# Patient Record
Sex: Female | Born: 1955 | Race: White | Hispanic: No | Smoking: Former smoker
Health system: Southern US, Academic
[De-identification: ages and names within clinical notes are randomized; demographics above are authoritative.]

## PROBLEM LIST (undated history)

## (undated) ENCOUNTER — Encounter (HOSPITAL_COMMUNITY): Payer: Self-pay

## (undated) ENCOUNTER — Ambulatory Visit (HOSPITAL_COMMUNITY): Payer: Self-pay

## (undated) DIAGNOSIS — C801 Malignant (primary) neoplasm, unspecified: Secondary | ICD-10-CM

## (undated) DIAGNOSIS — I2510 Atherosclerotic heart disease of native coronary artery without angina pectoris: Secondary | ICD-10-CM

## (undated) DIAGNOSIS — H9190 Unspecified hearing loss, unspecified ear: Secondary | ICD-10-CM

## (undated) DIAGNOSIS — R0602 Shortness of breath: Secondary | ICD-10-CM

## (undated) DIAGNOSIS — M6281 Muscle weakness (generalized): Secondary | ICD-10-CM

## (undated) DIAGNOSIS — E669 Obesity, unspecified: Secondary | ICD-10-CM

## (undated) DIAGNOSIS — R519 Headache, unspecified: Secondary | ICD-10-CM

## (undated) DIAGNOSIS — G43909 Migraine, unspecified, not intractable, without status migrainosus: Secondary | ICD-10-CM

## (undated) DIAGNOSIS — M1990 Unspecified osteoarthritis, unspecified site: Secondary | ICD-10-CM

## (undated) DIAGNOSIS — R0600 Dyspnea, unspecified: Secondary | ICD-10-CM

## (undated) DIAGNOSIS — E079 Disorder of thyroid, unspecified: Secondary | ICD-10-CM

## (undated) DIAGNOSIS — F329 Major depressive disorder, single episode, unspecified: Secondary | ICD-10-CM

## (undated) DIAGNOSIS — F32A Depression, unspecified: Secondary | ICD-10-CM

## (undated) DIAGNOSIS — H9319 Tinnitus, unspecified ear: Secondary | ICD-10-CM

## (undated) DIAGNOSIS — Z9089 Acquired absence of other organs: Secondary | ICD-10-CM

## (undated) DIAGNOSIS — E042 Nontoxic multinodular goiter: Secondary | ICD-10-CM

## (undated) DIAGNOSIS — R600 Localized edema: Secondary | ICD-10-CM

## (undated) DIAGNOSIS — Z973 Presence of spectacles and contact lenses: Secondary | ICD-10-CM

## (undated) DIAGNOSIS — Z87440 Personal history of urinary (tract) infections: Secondary | ICD-10-CM

## (undated) DIAGNOSIS — R6889 Other general symptoms and signs: Secondary | ICD-10-CM

## (undated) DIAGNOSIS — R21 Rash and other nonspecific skin eruption: Secondary | ICD-10-CM

## (undated) DIAGNOSIS — Z8669 Personal history of other diseases of the nervous system and sense organs: Secondary | ICD-10-CM

## (undated) DIAGNOSIS — Z9889 Other specified postprocedural states: Secondary | ICD-10-CM

## (undated) DIAGNOSIS — G8929 Other chronic pain: Secondary | ICD-10-CM

## (undated) DIAGNOSIS — G4730 Sleep apnea, unspecified: Secondary | ICD-10-CM

## (undated) DIAGNOSIS — R609 Edema, unspecified: Secondary | ICD-10-CM

## (undated) DIAGNOSIS — K219 Gastro-esophageal reflux disease without esophagitis: Secondary | ICD-10-CM

## (undated) DIAGNOSIS — R0609 Other forms of dyspnea: Secondary | ICD-10-CM

## (undated) DIAGNOSIS — D34 Benign neoplasm of thyroid gland: Secondary | ICD-10-CM

## (undated) DIAGNOSIS — E890 Postprocedural hypothyroidism: Secondary | ICD-10-CM

## (undated) DIAGNOSIS — F419 Anxiety disorder, unspecified: Secondary | ICD-10-CM

## (undated) DIAGNOSIS — T8853XA Unintended awareness under general anesthesia during procedure, initial encounter: Secondary | ICD-10-CM

## (undated) DIAGNOSIS — I10 Essential (primary) hypertension: Secondary | ICD-10-CM

## (undated) DIAGNOSIS — I5189 Other ill-defined heart diseases: Secondary | ICD-10-CM

## (undated) DIAGNOSIS — I00 Rheumatic fever without heart involvement: Secondary | ICD-10-CM

## (undated) DIAGNOSIS — F4310 Post-traumatic stress disorder, unspecified: Secondary | ICD-10-CM

## (undated) DIAGNOSIS — E785 Hyperlipidemia, unspecified: Secondary | ICD-10-CM

## (undated) DIAGNOSIS — R911 Solitary pulmonary nodule: Secondary | ICD-10-CM

## (undated) DIAGNOSIS — E049 Nontoxic goiter, unspecified: Secondary | ICD-10-CM

## (undated) HISTORY — DX: Tinnitus, unspecified ear: H93.19

## (undated) HISTORY — DX: Unspecified hearing loss, unspecified ear: H91.90

## (undated) HISTORY — PX: HX COLONOSCOPY: 2100001147

## (undated) HISTORY — DX: Acquired absence of other organs: Z90.89

## (undated) HISTORY — DX: Gastro-esophageal reflux disease without esophagitis: K21.9

## (undated) HISTORY — PX: HX TONSILLECTOMY: SHX27

## (undated) HISTORY — PX: HX WISDOM TEETH EXTRACTION: SHX21

## (undated) HISTORY — PX: HX HEART CATHETERIZATION: SHX148

## (undated) HISTORY — PX: HX HYSTERECTOMY: SHX81

## (undated) HISTORY — PX: HX PAROTIDECTOMY: SHX169

## (undated) HISTORY — PX: HX GALL BLADDER SURGERY/CHOLE: SHX55

## (undated) HISTORY — PX: HX CERVICAL SPINE SURGERY: 2100001197

## (undated) HISTORY — DX: Migraine, unspecified, not intractable, without status migrainosus: G43.909

## (undated) HISTORY — PX: SEPTOPLASTY: SUR1290

## (undated) HISTORY — DX: Benign neoplasm of thyroid gland: D34

## (undated) HISTORY — DX: Postprocedural hypothyroidism: E89.0

## (undated) HISTORY — PX: HX OOPHORECTOMY: SHX86

## (undated) HISTORY — PX: HX ADENOIDECTOMY: SHX29

## (undated) HISTORY — PX: PARATHYROID GLAND SURGERY: SHX732

## (undated) HISTORY — PX: HX APPENDECTOMY: SHX54

## (undated) HISTORY — DX: Nontoxic multinodular goiter: E04.2

## (undated) HISTORY — DX: Sleep apnea, unspecified: G47.30

## (undated) HISTORY — DX: Other specified postprocedural states: Z98.890

## (undated) HISTORY — DX: Hyperlipidemia, unspecified: E78.5

## (undated) HISTORY — DX: Nontoxic goiter, unspecified: E04.9

## (undated) HISTORY — DX: Unspecified osteoarthritis, unspecified site: M19.90

## (undated) HISTORY — DX: Post-traumatic stress disorder, unspecified: F43.10

## (undated) HISTORY — DX: Other ill-defined heart diseases: I51.89

## (undated) HISTORY — DX: Major depressive disorder, single episode, unspecified: F32.9

## (undated) HISTORY — PX: CARDIAC CATHETERIZATION: SHX172

## (undated) HISTORY — DX: Rheumatic fever without heart involvement: I00

## (undated) HISTORY — DX: Atherosclerotic heart disease of native coronary artery without angina pectoris: I25.10

## (undated) SURGERY — IR ARTHROGRAM - HIP RIGHT
Laterality: Right

---
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
## 1977-10-25 DIAGNOSIS — R011 Cardiac murmur, unspecified: Secondary | ICD-10-CM

## 1977-10-25 HISTORY — PX: PB REVISE ULNAR NERVE AT ELBOW: 64718

## 1977-10-25 HISTORY — PX: HX TOTAL VAGINAL HYSTERECTOMY: SHX84

## 1977-10-25 HISTORY — DX: Cardiac murmur, unspecified: R01.1

## 1997-11-15 ENCOUNTER — Ambulatory Visit (INDEPENDENT_AMBULATORY_CARE_PROVIDER_SITE_OTHER): Payer: Self-pay

## 1997-12-11 ENCOUNTER — Ambulatory Visit (INDEPENDENT_AMBULATORY_CARE_PROVIDER_SITE_OTHER): Payer: Self-pay

## 1998-01-08 ENCOUNTER — Ambulatory Visit (INDEPENDENT_AMBULATORY_CARE_PROVIDER_SITE_OTHER): Payer: Self-pay

## 1998-01-22 ENCOUNTER — Ambulatory Visit (INDEPENDENT_AMBULATORY_CARE_PROVIDER_SITE_OTHER): Payer: Self-pay

## 1998-03-31 ENCOUNTER — Ambulatory Visit (INDEPENDENT_AMBULATORY_CARE_PROVIDER_SITE_OTHER): Payer: Self-pay | Admitting: Family Medicine

## 1998-04-03 ENCOUNTER — Ambulatory Visit (INDEPENDENT_AMBULATORY_CARE_PROVIDER_SITE_OTHER): Payer: Self-pay

## 1998-05-08 ENCOUNTER — Ambulatory Visit (INDEPENDENT_AMBULATORY_CARE_PROVIDER_SITE_OTHER): Payer: Self-pay

## 1998-05-27 ENCOUNTER — Ambulatory Visit (INDEPENDENT_AMBULATORY_CARE_PROVIDER_SITE_OTHER): Payer: Self-pay | Admitting: Family Medicine

## 1998-05-28 ENCOUNTER — Ambulatory Visit (INDEPENDENT_AMBULATORY_CARE_PROVIDER_SITE_OTHER): Payer: Self-pay

## 1998-06-11 ENCOUNTER — Ambulatory Visit (INDEPENDENT_AMBULATORY_CARE_PROVIDER_SITE_OTHER): Payer: Self-pay | Admitting: Family Medicine

## 1998-06-18 ENCOUNTER — Ambulatory Visit (INDEPENDENT_AMBULATORY_CARE_PROVIDER_SITE_OTHER): Payer: Self-pay | Admitting: Orthopaedic Surgery

## 1998-08-28 ENCOUNTER — Ambulatory Visit (INDEPENDENT_AMBULATORY_CARE_PROVIDER_SITE_OTHER): Payer: Self-pay

## 1998-09-03 ENCOUNTER — Ambulatory Visit (HOSPITAL_BASED_OUTPATIENT_CLINIC_OR_DEPARTMENT_OTHER): Payer: Self-pay | Admitting: Family Medicine

## 1998-09-11 ENCOUNTER — Ambulatory Visit (INDEPENDENT_AMBULATORY_CARE_PROVIDER_SITE_OTHER): Payer: Self-pay

## 1998-10-02 ENCOUNTER — Ambulatory Visit (INDEPENDENT_AMBULATORY_CARE_PROVIDER_SITE_OTHER): Payer: Self-pay

## 1998-10-28 ENCOUNTER — Ambulatory Visit (INDEPENDENT_AMBULATORY_CARE_PROVIDER_SITE_OTHER): Payer: Self-pay

## 1999-01-15 ENCOUNTER — Observation Stay (HOSPITAL_COMMUNITY): Payer: Self-pay

## 1999-01-28 ENCOUNTER — Ambulatory Visit (INDEPENDENT_AMBULATORY_CARE_PROVIDER_SITE_OTHER): Payer: Self-pay

## 1999-02-05 ENCOUNTER — Ambulatory Visit (INDEPENDENT_AMBULATORY_CARE_PROVIDER_SITE_OTHER): Payer: Self-pay

## 1999-03-20 ENCOUNTER — Ambulatory Visit (INDEPENDENT_AMBULATORY_CARE_PROVIDER_SITE_OTHER): Payer: Self-pay

## 1999-04-03 ENCOUNTER — Ambulatory Visit (INDEPENDENT_AMBULATORY_CARE_PROVIDER_SITE_OTHER): Payer: Self-pay

## 1999-04-07 ENCOUNTER — Ambulatory Visit (INDEPENDENT_AMBULATORY_CARE_PROVIDER_SITE_OTHER): Payer: Self-pay | Admitting: Family Medicine

## 1999-04-21 ENCOUNTER — Ambulatory Visit (INDEPENDENT_AMBULATORY_CARE_PROVIDER_SITE_OTHER): Payer: Self-pay

## 1999-05-08 ENCOUNTER — Ambulatory Visit (INDEPENDENT_AMBULATORY_CARE_PROVIDER_SITE_OTHER): Payer: Self-pay | Admitting: Otolaryngology

## 1999-05-26 ENCOUNTER — Ambulatory Visit (INDEPENDENT_AMBULATORY_CARE_PROVIDER_SITE_OTHER): Payer: Self-pay

## 1999-06-02 ENCOUNTER — Ambulatory Visit (INDEPENDENT_AMBULATORY_CARE_PROVIDER_SITE_OTHER): Payer: Self-pay | Admitting: Otolaryngology

## 1999-06-29 ENCOUNTER — Emergency Department (HOSPITAL_COMMUNITY): Payer: Self-pay

## 1999-06-30 ENCOUNTER — Emergency Department (HOSPITAL_COMMUNITY): Payer: Self-pay

## 1999-07-08 ENCOUNTER — Emergency Department (HOSPITAL_COMMUNITY): Payer: Self-pay

## 1999-07-10 ENCOUNTER — Emergency Department (HOSPITAL_COMMUNITY): Payer: Self-pay | Admitting: Emergency Medicine

## 1999-07-21 ENCOUNTER — Ambulatory Visit (INDEPENDENT_AMBULATORY_CARE_PROVIDER_SITE_OTHER): Payer: Self-pay

## 1999-08-11 ENCOUNTER — Ambulatory Visit (INDEPENDENT_AMBULATORY_CARE_PROVIDER_SITE_OTHER): Payer: Self-pay

## 1999-10-03 ENCOUNTER — Ambulatory Visit (INDEPENDENT_AMBULATORY_CARE_PROVIDER_SITE_OTHER): Payer: Self-pay | Admitting: Family Medicine

## 1999-11-12 ENCOUNTER — Ambulatory Visit (INDEPENDENT_AMBULATORY_CARE_PROVIDER_SITE_OTHER): Payer: Self-pay | Admitting: Family Medicine

## 1999-11-17 ENCOUNTER — Other Ambulatory Visit: Payer: Self-pay

## 1999-11-17 ENCOUNTER — Ambulatory Visit (INDEPENDENT_AMBULATORY_CARE_PROVIDER_SITE_OTHER): Payer: Self-pay | Admitting: Family Medicine

## 1999-12-03 ENCOUNTER — Ambulatory Visit (INDEPENDENT_AMBULATORY_CARE_PROVIDER_SITE_OTHER): Payer: Self-pay | Admitting: Family Medicine

## 1999-12-07 ENCOUNTER — Ambulatory Visit (INDEPENDENT_AMBULATORY_CARE_PROVIDER_SITE_OTHER): Payer: Self-pay | Admitting: Family Medicine

## 1999-12-22 ENCOUNTER — Ambulatory Visit (HOSPITAL_BASED_OUTPATIENT_CLINIC_OR_DEPARTMENT_OTHER): Payer: Self-pay

## 1999-12-24 ENCOUNTER — Ambulatory Visit (INDEPENDENT_AMBULATORY_CARE_PROVIDER_SITE_OTHER): Payer: Self-pay | Admitting: Family Medicine

## 1999-12-30 ENCOUNTER — Ambulatory Visit (HOSPITAL_COMMUNITY): Payer: Self-pay

## 2000-01-26 ENCOUNTER — Ambulatory Visit (HOSPITAL_BASED_OUTPATIENT_CLINIC_OR_DEPARTMENT_OTHER): Payer: Self-pay

## 2000-03-16 ENCOUNTER — Ambulatory Visit (INDEPENDENT_AMBULATORY_CARE_PROVIDER_SITE_OTHER): Payer: Self-pay

## 2000-06-13 ENCOUNTER — Other Ambulatory Visit: Payer: Self-pay

## 2000-06-13 ENCOUNTER — Observation Stay (HOSPITAL_COMMUNITY): Payer: Self-pay | Admitting: Family Medicine

## 2000-06-30 ENCOUNTER — Ambulatory Visit (INDEPENDENT_AMBULATORY_CARE_PROVIDER_SITE_OTHER): Payer: Self-pay | Admitting: Family Medicine

## 2000-07-04 ENCOUNTER — Ambulatory Visit (INDEPENDENT_AMBULATORY_CARE_PROVIDER_SITE_OTHER): Payer: Self-pay

## 2000-08-01 ENCOUNTER — Ambulatory Visit (INDEPENDENT_AMBULATORY_CARE_PROVIDER_SITE_OTHER): Payer: Self-pay

## 2000-08-31 ENCOUNTER — Ambulatory Visit (INDEPENDENT_AMBULATORY_CARE_PROVIDER_SITE_OTHER): Payer: Self-pay | Admitting: Family Medicine

## 2000-09-02 ENCOUNTER — Other Ambulatory Visit: Payer: Self-pay

## 2000-09-02 ENCOUNTER — Ambulatory Visit (INDEPENDENT_AMBULATORY_CARE_PROVIDER_SITE_OTHER): Payer: Self-pay | Admitting: Family Medicine

## 2000-09-06 ENCOUNTER — Ambulatory Visit (INDEPENDENT_AMBULATORY_CARE_PROVIDER_SITE_OTHER): Payer: Self-pay | Admitting: Family Medicine

## 2000-09-19 ENCOUNTER — Ambulatory Visit (INDEPENDENT_AMBULATORY_CARE_PROVIDER_SITE_OTHER): Payer: Self-pay | Admitting: Family Medicine

## 2000-09-29 ENCOUNTER — Ambulatory Visit (INDEPENDENT_AMBULATORY_CARE_PROVIDER_SITE_OTHER): Payer: Self-pay | Admitting: Family Medicine

## 2000-12-12 ENCOUNTER — Ambulatory Visit (INDEPENDENT_AMBULATORY_CARE_PROVIDER_SITE_OTHER): Payer: Self-pay | Admitting: Family Medicine

## 2001-02-03 ENCOUNTER — Ambulatory Visit (INDEPENDENT_AMBULATORY_CARE_PROVIDER_SITE_OTHER): Payer: Self-pay

## 2001-02-10 ENCOUNTER — Other Ambulatory Visit: Payer: Self-pay

## 2001-02-10 ENCOUNTER — Ambulatory Visit (INDEPENDENT_AMBULATORY_CARE_PROVIDER_SITE_OTHER): Payer: Self-pay

## 2001-02-17 ENCOUNTER — Ambulatory Visit (INDEPENDENT_AMBULATORY_CARE_PROVIDER_SITE_OTHER): Payer: Self-pay

## 2001-02-28 ENCOUNTER — Ambulatory Visit (INDEPENDENT_AMBULATORY_CARE_PROVIDER_SITE_OTHER): Payer: Self-pay | Admitting: Family Medicine

## 2001-07-05 ENCOUNTER — Ambulatory Visit (INDEPENDENT_AMBULATORY_CARE_PROVIDER_SITE_OTHER): Payer: Self-pay

## 2001-07-25 ENCOUNTER — Ambulatory Visit (INDEPENDENT_AMBULATORY_CARE_PROVIDER_SITE_OTHER): Payer: Self-pay

## 2001-08-02 ENCOUNTER — Other Ambulatory Visit: Payer: Self-pay

## 2001-08-02 ENCOUNTER — Ambulatory Visit (INDEPENDENT_AMBULATORY_CARE_PROVIDER_SITE_OTHER): Payer: Self-pay | Admitting: Family Medicine

## 2001-09-04 ENCOUNTER — Ambulatory Visit (INDEPENDENT_AMBULATORY_CARE_PROVIDER_SITE_OTHER): Payer: Self-pay | Admitting: Family Medicine

## 2001-09-11 ENCOUNTER — Ambulatory Visit (INDEPENDENT_AMBULATORY_CARE_PROVIDER_SITE_OTHER): Payer: Self-pay | Admitting: Family Medicine

## 2001-09-25 ENCOUNTER — Ambulatory Visit (HOSPITAL_COMMUNITY): Payer: Self-pay

## 2001-09-25 ENCOUNTER — Other Ambulatory Visit: Payer: Self-pay

## 2001-10-04 ENCOUNTER — Ambulatory Visit (INDEPENDENT_AMBULATORY_CARE_PROVIDER_SITE_OTHER): Payer: Self-pay | Admitting: Family Medicine

## 2001-10-09 ENCOUNTER — Ambulatory Visit (INDEPENDENT_AMBULATORY_CARE_PROVIDER_SITE_OTHER): Payer: Self-pay

## 2001-10-23 ENCOUNTER — Ambulatory Visit (INDEPENDENT_AMBULATORY_CARE_PROVIDER_SITE_OTHER): Payer: Self-pay | Admitting: Family Medicine

## 2001-11-09 ENCOUNTER — Ambulatory Visit (INDEPENDENT_AMBULATORY_CARE_PROVIDER_SITE_OTHER): Payer: Self-pay | Admitting: SURGERY OF THE HAND

## 2001-11-22 ENCOUNTER — Ambulatory Visit (INDEPENDENT_AMBULATORY_CARE_PROVIDER_SITE_OTHER): Payer: Self-pay | Admitting: Family Medicine

## 2001-11-23 ENCOUNTER — Ambulatory Visit (INDEPENDENT_AMBULATORY_CARE_PROVIDER_SITE_OTHER): Payer: Self-pay | Admitting: SURGERY OF THE HAND

## 2002-01-10 ENCOUNTER — Emergency Department (HOSPITAL_COMMUNITY): Payer: Self-pay | Admitting: Emergency Medicine

## 2002-01-10 ENCOUNTER — Other Ambulatory Visit: Payer: Self-pay | Admitting: Emergency Medicine

## 2002-01-11 ENCOUNTER — Ambulatory Visit (INDEPENDENT_AMBULATORY_CARE_PROVIDER_SITE_OTHER): Payer: Self-pay | Admitting: Ophthalmology

## 2002-01-22 ENCOUNTER — Ambulatory Visit (INDEPENDENT_AMBULATORY_CARE_PROVIDER_SITE_OTHER): Payer: Self-pay | Admitting: Family Medicine

## 2002-02-15 ENCOUNTER — Ambulatory Visit (INDEPENDENT_AMBULATORY_CARE_PROVIDER_SITE_OTHER): Payer: Self-pay | Admitting: Family Medicine

## 2002-03-21 ENCOUNTER — Ambulatory Visit (INDEPENDENT_AMBULATORY_CARE_PROVIDER_SITE_OTHER): Payer: Self-pay

## 2002-03-23 ENCOUNTER — Ambulatory Visit (INDEPENDENT_AMBULATORY_CARE_PROVIDER_SITE_OTHER): Payer: Self-pay | Admitting: Family Medicine

## 2002-03-24 ENCOUNTER — Ambulatory Visit (HOSPITAL_COMMUNITY): Payer: Self-pay

## 2002-03-24 ENCOUNTER — Other Ambulatory Visit: Payer: Self-pay

## 2002-03-25 ENCOUNTER — Ambulatory Visit (HOSPITAL_COMMUNITY): Payer: Self-pay

## 2002-04-10 ENCOUNTER — Ambulatory Visit (INDEPENDENT_AMBULATORY_CARE_PROVIDER_SITE_OTHER): Payer: Self-pay | Admitting: Family Medicine

## 2002-05-16 ENCOUNTER — Ambulatory Visit (INDEPENDENT_AMBULATORY_CARE_PROVIDER_SITE_OTHER): Payer: Self-pay | Admitting: Family Medicine

## 2002-06-07 ENCOUNTER — Ambulatory Visit (INDEPENDENT_AMBULATORY_CARE_PROVIDER_SITE_OTHER): Payer: Self-pay | Admitting: Family Medicine

## 2002-06-08 ENCOUNTER — Ambulatory Visit (HOSPITAL_COMMUNITY): Payer: Self-pay

## 2002-06-08 ENCOUNTER — Other Ambulatory Visit: Payer: Self-pay

## 2002-08-10 ENCOUNTER — Ambulatory Visit (INDEPENDENT_AMBULATORY_CARE_PROVIDER_SITE_OTHER): Payer: Self-pay | Admitting: Family Medicine

## 2002-10-09 ENCOUNTER — Emergency Department (HOSPITAL_COMMUNITY): Payer: Self-pay | Admitting: Emergency Medicine-WVUH

## 2002-10-11 ENCOUNTER — Ambulatory Visit (INDEPENDENT_AMBULATORY_CARE_PROVIDER_SITE_OTHER): Payer: Self-pay | Admitting: Family Medicine

## 2002-11-13 ENCOUNTER — Ambulatory Visit (HOSPITAL_COMMUNITY): Payer: Self-pay

## 2002-12-03 ENCOUNTER — Inpatient Hospital Stay (HOSPITAL_COMMUNITY): Payer: Self-pay | Admitting: Neurology

## 2002-12-10 ENCOUNTER — Ambulatory Visit (INDEPENDENT_AMBULATORY_CARE_PROVIDER_SITE_OTHER): Payer: Self-pay | Admitting: Family Medicine

## 2003-01-02 ENCOUNTER — Ambulatory Visit (INDEPENDENT_AMBULATORY_CARE_PROVIDER_SITE_OTHER): Payer: Self-pay | Admitting: Family Medicine

## 2003-01-03 ENCOUNTER — Ambulatory Visit (INDEPENDENT_AMBULATORY_CARE_PROVIDER_SITE_OTHER): Payer: Self-pay | Admitting: Family Medicine

## 2003-03-06 ENCOUNTER — Ambulatory Visit (INDEPENDENT_AMBULATORY_CARE_PROVIDER_SITE_OTHER): Payer: Self-pay | Admitting: Family Medicine

## 2003-03-06 ENCOUNTER — Inpatient Hospital Stay (HOSPITAL_COMMUNITY): Payer: Self-pay | Admitting: Psychiatry

## 2003-03-26 ENCOUNTER — Ambulatory Visit (INDEPENDENT_AMBULATORY_CARE_PROVIDER_SITE_OTHER): Payer: Self-pay

## 2003-04-11 ENCOUNTER — Ambulatory Visit (INDEPENDENT_AMBULATORY_CARE_PROVIDER_SITE_OTHER): Payer: Self-pay

## 2003-05-07 ENCOUNTER — Ambulatory Visit (INDEPENDENT_AMBULATORY_CARE_PROVIDER_SITE_OTHER): Payer: Self-pay | Admitting: Family Medicine

## 2003-05-30 ENCOUNTER — Ambulatory Visit (HOSPITAL_COMMUNITY): Payer: Self-pay

## 2003-06-04 ENCOUNTER — Ambulatory Visit (INDEPENDENT_AMBULATORY_CARE_PROVIDER_SITE_OTHER): Payer: Self-pay

## 2003-07-04 ENCOUNTER — Ambulatory Visit (INDEPENDENT_AMBULATORY_CARE_PROVIDER_SITE_OTHER): Payer: Self-pay | Admitting: Family Medicine

## 2003-07-16 ENCOUNTER — Ambulatory Visit (INDEPENDENT_AMBULATORY_CARE_PROVIDER_SITE_OTHER): Payer: Self-pay

## 2003-07-23 ENCOUNTER — Ambulatory Visit (INDEPENDENT_AMBULATORY_CARE_PROVIDER_SITE_OTHER): Payer: Self-pay

## 2003-08-23 ENCOUNTER — Encounter (FREE_STANDING_LABORATORY_FACILITY): Payer: Self-pay | Admitting: Pathology

## 2003-08-23 ENCOUNTER — Ambulatory Visit (HOSPITAL_COMMUNITY): Payer: Self-pay

## 2003-09-09 ENCOUNTER — Ambulatory Visit (INDEPENDENT_AMBULATORY_CARE_PROVIDER_SITE_OTHER): Payer: Self-pay

## 2003-10-01 ENCOUNTER — Ambulatory Visit (HOSPITAL_COMMUNITY): Payer: Self-pay

## 2003-10-07 ENCOUNTER — Ambulatory Visit (INDEPENDENT_AMBULATORY_CARE_PROVIDER_SITE_OTHER): Payer: Self-pay | Admitting: Family Medicine

## 2003-10-08 ENCOUNTER — Encounter (FREE_STANDING_LABORATORY_FACILITY): Payer: Self-pay | Admitting: Pathology

## 2003-10-15 ENCOUNTER — Encounter (FREE_STANDING_LABORATORY_FACILITY): Payer: Self-pay | Admitting: Pathology

## 2003-10-28 ENCOUNTER — Ambulatory Visit (INDEPENDENT_AMBULATORY_CARE_PROVIDER_SITE_OTHER): Payer: Self-pay

## 2003-10-29 ENCOUNTER — Observation Stay (HOSPITAL_COMMUNITY): Payer: Self-pay | Admitting: Family Medicine

## 2003-11-06 ENCOUNTER — Ambulatory Visit (INDEPENDENT_AMBULATORY_CARE_PROVIDER_SITE_OTHER): Payer: Self-pay | Admitting: Family Medicine

## 2004-01-01 ENCOUNTER — Ambulatory Visit (INDEPENDENT_AMBULATORY_CARE_PROVIDER_SITE_OTHER): Payer: Self-pay | Admitting: Family Medicine

## 2004-01-24 ENCOUNTER — Ambulatory Visit (INDEPENDENT_AMBULATORY_CARE_PROVIDER_SITE_OTHER): Payer: Self-pay

## 2004-01-30 ENCOUNTER — Ambulatory Visit (INDEPENDENT_AMBULATORY_CARE_PROVIDER_SITE_OTHER): Payer: Self-pay | Admitting: Family Medicine

## 2004-02-03 ENCOUNTER — Ambulatory Visit (INDEPENDENT_AMBULATORY_CARE_PROVIDER_SITE_OTHER): Payer: Self-pay | Admitting: Family Medicine

## 2004-02-04 ENCOUNTER — Ambulatory Visit (INDEPENDENT_AMBULATORY_CARE_PROVIDER_SITE_OTHER): Payer: Self-pay | Admitting: Family Medicine

## 2004-02-05 ENCOUNTER — Ambulatory Visit (INDEPENDENT_AMBULATORY_CARE_PROVIDER_SITE_OTHER): Payer: Self-pay | Admitting: Family Medicine

## 2004-02-20 ENCOUNTER — Ambulatory Visit (HOSPITAL_COMMUNITY): Payer: Self-pay | Admitting: Gastroenterology

## 2004-02-21 ENCOUNTER — Ambulatory Visit (INDEPENDENT_AMBULATORY_CARE_PROVIDER_SITE_OTHER): Payer: Self-pay | Admitting: Family Medicine

## 2004-02-26 ENCOUNTER — Ambulatory Visit (INDEPENDENT_AMBULATORY_CARE_PROVIDER_SITE_OTHER): Payer: Self-pay | Admitting: Family Medicine

## 2004-03-05 ENCOUNTER — Ambulatory Visit (HOSPITAL_COMMUNITY): Payer: Self-pay

## 2004-03-26 ENCOUNTER — Ambulatory Visit (HOSPITAL_COMMUNITY): Payer: Self-pay

## 2004-04-16 ENCOUNTER — Ambulatory Visit (HOSPITAL_COMMUNITY): Payer: Self-pay

## 2004-05-06 ENCOUNTER — Ambulatory Visit (INDEPENDENT_AMBULATORY_CARE_PROVIDER_SITE_OTHER): Payer: Self-pay

## 2004-05-07 ENCOUNTER — Ambulatory Visit (HOSPITAL_COMMUNITY): Payer: Self-pay

## 2004-05-29 ENCOUNTER — Ambulatory Visit (INDEPENDENT_AMBULATORY_CARE_PROVIDER_SITE_OTHER): Payer: Self-pay

## 2004-06-02 ENCOUNTER — Ambulatory Visit (HOSPITAL_COMMUNITY): Payer: Self-pay

## 2004-07-23 ENCOUNTER — Emergency Department (HOSPITAL_COMMUNITY): Payer: Self-pay | Admitting: Emergency Medicine

## 2004-07-27 ENCOUNTER — Ambulatory Visit (INDEPENDENT_AMBULATORY_CARE_PROVIDER_SITE_OTHER): Payer: Self-pay | Admitting: Family Medicine

## 2004-07-31 ENCOUNTER — Ambulatory Visit (INDEPENDENT_AMBULATORY_CARE_PROVIDER_SITE_OTHER): Payer: Self-pay | Admitting: Family Medicine

## 2004-09-01 ENCOUNTER — Ambulatory Visit (INDEPENDENT_AMBULATORY_CARE_PROVIDER_SITE_OTHER): Payer: Self-pay

## 2004-10-20 ENCOUNTER — Ambulatory Visit (INDEPENDENT_AMBULATORY_CARE_PROVIDER_SITE_OTHER): Payer: Self-pay

## 2004-11-30 ENCOUNTER — Other Ambulatory Visit (INDEPENDENT_AMBULATORY_CARE_PROVIDER_SITE_OTHER): Payer: Self-pay

## 2005-03-24 ENCOUNTER — Other Ambulatory Visit: Payer: Self-pay

## 2005-09-07 ENCOUNTER — Other Ambulatory Visit (INDEPENDENT_AMBULATORY_CARE_PROVIDER_SITE_OTHER): Payer: Self-pay

## 2005-10-25 HISTORY — PX: HX WRIST FRACTURE TX: SHX121

## 2005-10-25 HISTORY — PX: HX ANKLE FRACTURE TX: SHX122

## 2006-01-07 ENCOUNTER — Ambulatory Visit (HOSPITAL_COMMUNITY): Payer: Self-pay

## 2006-01-12 ENCOUNTER — Ambulatory Visit (INDEPENDENT_AMBULATORY_CARE_PROVIDER_SITE_OTHER): Payer: Self-pay | Admitting: Family Medicine

## 2006-02-10 ENCOUNTER — Ambulatory Visit (INDEPENDENT_AMBULATORY_CARE_PROVIDER_SITE_OTHER): Payer: Self-pay | Admitting: Family Medicine

## 2006-02-11 ENCOUNTER — Ambulatory Visit (HOSPITAL_COMMUNITY): Payer: Self-pay

## 2006-02-23 ENCOUNTER — Ambulatory Visit (INDEPENDENT_AMBULATORY_CARE_PROVIDER_SITE_OTHER): Payer: Self-pay | Admitting: Family Medicine

## 2006-03-11 ENCOUNTER — Ambulatory Visit (INDEPENDENT_AMBULATORY_CARE_PROVIDER_SITE_OTHER): Payer: Self-pay | Admitting: Family Medicine

## 2006-03-23 ENCOUNTER — Ambulatory Visit (HOSPITAL_COMMUNITY): Payer: Self-pay

## 2006-04-15 ENCOUNTER — Ambulatory Visit (HOSPITAL_COMMUNITY): Payer: Self-pay

## 2006-04-22 ENCOUNTER — Ambulatory Visit (INDEPENDENT_AMBULATORY_CARE_PROVIDER_SITE_OTHER): Payer: Self-pay | Admitting: Family Medicine

## 2006-05-05 ENCOUNTER — Ambulatory Visit (INDEPENDENT_AMBULATORY_CARE_PROVIDER_SITE_OTHER): Payer: Self-pay

## 2006-05-24 ENCOUNTER — Ambulatory Visit (HOSPITAL_COMMUNITY): Payer: Self-pay

## 2006-07-06 ENCOUNTER — Ambulatory Visit (INDEPENDENT_AMBULATORY_CARE_PROVIDER_SITE_OTHER): Payer: Self-pay | Admitting: Family Medicine

## 2006-07-18 ENCOUNTER — Ambulatory Visit (INDEPENDENT_AMBULATORY_CARE_PROVIDER_SITE_OTHER): Payer: Self-pay | Admitting: Family Medicine

## 2006-08-04 ENCOUNTER — Ambulatory Visit (HOSPITAL_COMMUNITY): Payer: Self-pay | Admitting: ORTHOPEDIC, SPORTS MEDICINE

## 2006-08-08 ENCOUNTER — Ambulatory Visit (HOSPITAL_COMMUNITY): Payer: Self-pay

## 2006-08-12 ENCOUNTER — Ambulatory Visit (HOSPITAL_COMMUNITY): Payer: Self-pay

## 2006-09-11 ENCOUNTER — Ambulatory Visit (INDEPENDENT_AMBULATORY_CARE_PROVIDER_SITE_OTHER): Payer: Self-pay

## 2006-09-20 ENCOUNTER — Ambulatory Visit (HOSPITAL_COMMUNITY): Payer: Self-pay | Admitting: ORTHOPEDIC, SPORTS MEDICINE

## 2006-11-07 ENCOUNTER — Ambulatory Visit (HOSPITAL_COMMUNITY): Payer: Self-pay | Admitting: ORTHOPEDIC, SPORTS MEDICINE

## 2006-11-16 ENCOUNTER — Ambulatory Visit (INDEPENDENT_AMBULATORY_CARE_PROVIDER_SITE_OTHER): Payer: Self-pay

## 2006-11-18 ENCOUNTER — Ambulatory Visit (INDEPENDENT_AMBULATORY_CARE_PROVIDER_SITE_OTHER): Payer: Self-pay | Admitting: Obstetrics & Gynecology

## 2006-11-23 ENCOUNTER — Ambulatory Visit (HOSPITAL_COMMUNITY): Payer: Self-pay

## 2006-12-18 ENCOUNTER — Emergency Department (HOSPITAL_COMMUNITY): Payer: Self-pay | Admitting: EMERGENCY MEDICINE

## 2006-12-21 ENCOUNTER — Ambulatory Visit (HOSPITAL_COMMUNITY): Payer: Self-pay

## 2006-12-21 DIAGNOSIS — F339 Major depressive disorder, recurrent, unspecified: Secondary | ICD-10-CM | POA: Insufficient documentation

## 2007-01-02 ENCOUNTER — Encounter (INDEPENDENT_AMBULATORY_CARE_PROVIDER_SITE_OTHER): Payer: Self-pay | Admitting: ORTHOPEDIC, SPORTS MEDICINE

## 2007-01-09 ENCOUNTER — Encounter (INDEPENDENT_AMBULATORY_CARE_PROVIDER_SITE_OTHER): Payer: MEDICAID | Admitting: ORTHOPEDIC, SPORTS MEDICINE

## 2007-01-13 ENCOUNTER — Encounter (HOSPITAL_COMMUNITY): Payer: MEDICAID | Admitting: Addiction Psychiatry

## 2007-01-13 ENCOUNTER — Encounter (INDEPENDENT_AMBULATORY_CARE_PROVIDER_SITE_OTHER): Payer: MEDICAID | Admitting: Clinical Social Worker

## 2007-02-08 ENCOUNTER — Other Ambulatory Visit (INDEPENDENT_AMBULATORY_CARE_PROVIDER_SITE_OTHER): Payer: Self-pay | Admitting: Family Medicine

## 2007-02-08 ENCOUNTER — Encounter (INDEPENDENT_AMBULATORY_CARE_PROVIDER_SITE_OTHER): Payer: MEDICAID | Admitting: Family Medicine

## 2007-02-08 DIAGNOSIS — J339 Nasal polyp, unspecified: Secondary | ICD-10-CM

## 2007-02-08 DIAGNOSIS — S139XXA Sprain of joints and ligaments of unspecified parts of neck, initial encounter: Secondary | ICD-10-CM

## 2007-02-08 DIAGNOSIS — R9389 Abnormal findings on diagnostic imaging of other specified body structures: Secondary | ICD-10-CM

## 2007-02-10 ENCOUNTER — Encounter (HOSPITAL_COMMUNITY): Payer: MEDICAID | Admitting: Clinical Social Worker

## 2007-02-10 ENCOUNTER — Encounter (INDEPENDENT_AMBULATORY_CARE_PROVIDER_SITE_OTHER): Payer: MEDICAID | Admitting: Addiction Psychiatry

## 2007-02-13 ENCOUNTER — Ambulatory Visit
Admission: RE | Admit: 2007-02-13 | Discharge: 2007-02-13 | Disposition: A | Discharge status: Home / Self Care | Payer: MEDICAID | Attending: Family Medicine | Admitting: Family Medicine

## 2007-02-13 DIAGNOSIS — M47812 Spondylosis without myelopathy or radiculopathy, cervical region: Secondary | ICD-10-CM | POA: Insufficient documentation

## 2007-02-13 DIAGNOSIS — E041 Nontoxic single thyroid nodule: Secondary | ICD-10-CM | POA: Insufficient documentation

## 2007-03-03 ENCOUNTER — Other Ambulatory Visit (INDEPENDENT_AMBULATORY_CARE_PROVIDER_SITE_OTHER): Payer: Self-pay | Admitting: Otolaryngology

## 2007-03-03 ENCOUNTER — Ambulatory Visit (INDEPENDENT_AMBULATORY_CARE_PROVIDER_SITE_OTHER): Payer: MEDICAID | Admitting: Otolaryngology

## 2007-03-10 ENCOUNTER — Encounter (INDEPENDENT_AMBULATORY_CARE_PROVIDER_SITE_OTHER): Payer: MEDICAID | Admitting: Addiction Psychiatry

## 2007-03-10 ENCOUNTER — Encounter (INDEPENDENT_AMBULATORY_CARE_PROVIDER_SITE_OTHER): Payer: MEDICAID | Admitting: Clinical Social Worker

## 2007-03-24 ENCOUNTER — Ambulatory Visit
Admission: RE | Admit: 2007-03-24 | Discharge: 2007-03-24 | Disposition: A | Discharge status: Home / Self Care | Payer: MEDICAID | Attending: Otolaryngology | Admitting: Otolaryngology

## 2007-03-24 ENCOUNTER — Encounter (INDEPENDENT_AMBULATORY_CARE_PROVIDER_SITE_OTHER): Payer: MEDICAID | Admitting: Otolaryngology

## 2007-03-24 DIAGNOSIS — J329 Chronic sinusitis, unspecified: Secondary | ICD-10-CM | POA: Insufficient documentation

## 2007-04-06 ENCOUNTER — Ambulatory Visit
Admission: RE | Admit: 2007-04-06 | Discharge: 2007-04-06 | Disposition: A | Discharge status: Home / Self Care | Payer: MEDICAID | Attending: Family Medicine | Admitting: Family Medicine

## 2007-04-06 ENCOUNTER — Encounter (INDEPENDENT_AMBULATORY_CARE_PROVIDER_SITE_OTHER): Payer: MEDICAID | Admitting: Family Medicine

## 2007-04-06 DIAGNOSIS — R635 Abnormal weight gain: Secondary | ICD-10-CM | POA: Insufficient documentation

## 2007-05-01 ENCOUNTER — Encounter (INDEPENDENT_AMBULATORY_CARE_PROVIDER_SITE_OTHER): Payer: MEDICAID

## 2007-05-04 ENCOUNTER — Other Ambulatory Visit (INDEPENDENT_AMBULATORY_CARE_PROVIDER_SITE_OTHER): Payer: Self-pay | Admitting: Medical

## 2007-05-04 ENCOUNTER — Ambulatory Visit (INDEPENDENT_AMBULATORY_CARE_PROVIDER_SITE_OTHER): Payer: MEDICAID | Admitting: Medical

## 2007-05-04 ENCOUNTER — Ambulatory Visit
Admission: RE | Admit: 2007-05-04 | Discharge: 2007-05-04 | Disposition: A | Discharge status: Home / Self Care | Payer: MEDICAID | Attending: Medical | Admitting: Medical

## 2007-05-04 DIAGNOSIS — M25529 Pain in unspecified elbow: Secondary | ICD-10-CM | POA: Insufficient documentation

## 2007-05-10 ENCOUNTER — Encounter (INDEPENDENT_AMBULATORY_CARE_PROVIDER_SITE_OTHER): Payer: Self-pay | Admitting: Family Medicine

## 2007-05-19 ENCOUNTER — Encounter (HOSPITAL_COMMUNITY): Payer: MEDICAID | Admitting: Clinical Social Worker

## 2007-05-26 ENCOUNTER — Encounter (HOSPITAL_COMMUNITY): Payer: MEDICAID

## 2007-05-28 ENCOUNTER — Ambulatory Visit (INDEPENDENT_AMBULATORY_CARE_PROVIDER_SITE_OTHER): Payer: MEDICAID

## 2007-05-30 ENCOUNTER — Ambulatory Visit (INDEPENDENT_AMBULATORY_CARE_PROVIDER_SITE_OTHER): Payer: MEDICAID

## 2007-05-30 ENCOUNTER — Other Ambulatory Visit (INDEPENDENT_AMBULATORY_CARE_PROVIDER_SITE_OTHER): Payer: Self-pay | Admitting: ORTHOPEDIC, SPORTS MEDICINE

## 2007-05-30 ENCOUNTER — Encounter (INDEPENDENT_AMBULATORY_CARE_PROVIDER_SITE_OTHER): Payer: MEDICAID | Admitting: ORTHOPEDIC, SPORTS MEDICINE

## 2007-06-13 ENCOUNTER — Encounter (INDEPENDENT_AMBULATORY_CARE_PROVIDER_SITE_OTHER): Payer: MEDICAID | Admitting: ORTHOPEDIC, SPORTS MEDICINE

## 2007-06-23 ENCOUNTER — Encounter (INDEPENDENT_AMBULATORY_CARE_PROVIDER_SITE_OTHER): Payer: MEDICAID | Admitting: Otolaryngology

## 2007-06-27 ENCOUNTER — Other Ambulatory Visit (INDEPENDENT_AMBULATORY_CARE_PROVIDER_SITE_OTHER): Payer: Self-pay | Admitting: Otolaryngology

## 2007-06-27 ENCOUNTER — Encounter (INDEPENDENT_AMBULATORY_CARE_PROVIDER_SITE_OTHER): Payer: MEDICAID | Admitting: Otolaryngology

## 2007-07-15 ENCOUNTER — Encounter (EMERGENCY_DEPARTMENT_HOSPITAL): Payer: MEDICAID | Admitting: Emergency Medicine

## 2007-07-15 ENCOUNTER — Emergency Department
Admission: EM | Admit: 2007-07-15 | Discharge: 2007-07-15 | Disposition: A | Discharge status: Home / Self Care | Payer: MEDICAID | Source: Emergency Department | Attending: Emergency Medicine | Admitting: Emergency Medicine

## 2007-07-15 ENCOUNTER — Emergency Department (EMERGENCY_DEPARTMENT_HOSPITAL): Payer: MEDICAID

## 2007-07-15 DIAGNOSIS — S82899A Other fracture of unspecified lower leg, initial encounter for closed fracture: Secondary | ICD-10-CM

## 2007-07-15 DIAGNOSIS — S82409A Unspecified fracture of shaft of unspecified fibula, initial encounter for closed fracture: Secondary | ICD-10-CM

## 2007-07-15 DIAGNOSIS — S62009A Unspecified fracture of navicular [scaphoid] bone of unspecified wrist, initial encounter for closed fracture: Secondary | ICD-10-CM | POA: Insufficient documentation

## 2007-07-15 DIAGNOSIS — W010XXA Fall on same level from slipping, tripping and stumbling without subsequent striking against object, initial encounter: Secondary | ICD-10-CM | POA: Insufficient documentation

## 2007-07-15 DIAGNOSIS — M25569 Pain in unspecified knee: Secondary | ICD-10-CM

## 2007-07-15 DIAGNOSIS — IMO0002 Reserved for concepts with insufficient information to code with codable children: Secondary | ICD-10-CM | POA: Insufficient documentation

## 2007-07-15 DIAGNOSIS — M25579 Pain in unspecified ankle and joints of unspecified foot: Secondary | ICD-10-CM

## 2007-07-15 NOTE — ED Resident Handoff Note (Signed)
Patient has no complain of tightness or swelling post-cast and post-splint.  Orthopedic saw patient and applied cast in the left fibular fracture and needs further work up with Dr Ihor Austin on Monday 07/17/07. Patient also received splint of the right hand.   Patient is discharge with pain medication - Percocet 5/325 1-2 tabs po q4-6h/prn #20 tablets.

## 2007-07-15 NOTE — Discharge Instructions (Signed)
Fibular Shaft Fracture, Undisplaced (Adult)   Treated with Immobilization   You have a fracture (break) of your fibula. This is the bone in your lower leg located on the outside of the leg. These fractures are easily diagnosed with x-rays.     TREATMENT   You have a simple fracture of the part of the fibula, which is located between the knee and ankle. This bone usually will heal without disability and can often be treated without casting or splinting. This means the fracture will heal well during normal use and daily activities without being held in place. Occasionally a cast or splint may be placed on these fractures if it is required for comfort or if boney fragments (break apart) have been badly out of alignment.   HOME CARE INSTRUCTIONS   ØØ Apply ice to the injury for 15 to 20 minutes, four times per day while awake, for 2 days. Put the ice in a plastic bag and place a thin towel between the bag of ice and your leg. This helps keep swelling down.   Ø Following the use of ice, hot packs may be used for twenty minutes, three to four times per day as needed, for comfort. It is not necessary to use heat if it causes discomfort. Do not sleep with a heating pad. This may cause burns. If you are diabetic, do not use a heating pad unless instructed to do so.   Ø Use crutches as directed. Resume walking without crutches as directed by your caregiver or when comfortable doing so.   Ø Take medications as directed by your caregiver. Use acetaminophen (Tylenol®) or ibuprofen (Advil® or Motrin®), alone or together, in their usual doses, as needed for pain if approved by your caregiver.   Ø Keep appointments for follow up X-rays if these are required.   SEEK MEDICAL ATTENTION IF:   Ø You have continued severe pain or more swelling   Ø The medications do not control the pain.   Ø Your skin or nails below the injury turn blue or grey or feel cold or numb.   Document Released: 10/11/2005   Exit[employer]® Patient Information ©2008  [employer].

## 2007-07-15 NOTE — ED Provider Notes (Signed)
HPI 51 year old female tripped on uneven carpet this morning around 11am. No head injury, no LOC. She c/o L ankle pain and swelling, L knee pain, and R hand pain. She has superficial abrasions on her L knee and R hand. No other complaints. Last tetanus unknown.      ROS No fevers, chills, cp, sob, abdominal pain, nausea, vomiting, LOC, dizziness.        Past History Allergies, reflux, hysterectomy. FH of CAD, HTN, DM. Denies smoking, etoh use.        Physical Exam   BP 118/88   Pulse 114   Temp 36.8 C (98.2 F)   Resp 22   Wt 83.915 kg (185 lb)   SpO2 99%  Nursing note and vitals reviewed.  Constitutional: She is oriented. She appears well-developed and well-nourished. She is destressed.   Head: Normocephalic and atraumatic.   Mouth/Throat: Oropharynx is clear and moist. No oropharyngeal exudate.   Eyes: Conjunctivae and extraocular motions are normal.   Neck: Normal range of motion. Neck supple.   Cardiovascular: Normal rate, regular rhythm, normal heart sounds and intact distal pulses.   Pulmonary/Chest: Effort normal and breath sounds normal. No stridor. No respiratory distress.   Abdominal: Bowel sounds are normal. She exhibits no distension. Soft. No tenderness.   Musculoskeletal: L ankle swollen and bruised laterally. Very tender, no obvious deformity. Neurovascularly intact. L knee swollen with abrasions. Normal ROM. R hand ttp over scaphoid and with axial loading of thumb, also ttp on volar surface, base of thumb.  Neurological: She is alert and oriented. No cranial nerve deficit.   Skin: Skin is warm and dry. Abrasions over her L knee and R palm.  Psychiatric: She appears anxious        Course Pt given multiple doses of morphine for pain control. Radiographs were obtained of L knee, tib/fib, ankle and foot, also R hand. All were unremarkable except she has a minimally displaced oblique Weber B fracture of the distal L fibula. Orthopedics was consulted about her fracture, they splinted her and will see her in 2 days in clinic. I splinted her R arm in a thumb spica splint for clinical suspicion of scaphoid fracture and she will f/u for that in 2 weeks.

## 2007-07-15 NOTE — ED Attending Note (Signed)
Note begun by:  Hazle Coca, MD. 07/15/2007, 3:27 PM    I was physically present and directly supervised this patient's care.  Patient seen and examined.  Resident history and exam reviewed.  Key elements in addition to and/or correction of that documentation are as follows:    HPI:    51 y.o. female presents status post a trip and fall on carpet.  The patient now reports having left ankle, left knee, and right hand pain.  The patient denies any head injury and does not report any loss of consciousness.  Further historical details can be found in the resident's note.    PE:   VS on presentation: Blood pressure 118/88, pulse 114, temperature 36.8 C (98.2 F), resp. rate 22, weight 83.915 kg (185 lb), SpO2 99%.    Lungs: CTA B/L. BS = B/L. No wheezes, rales, or rhonchi.  CV: RRR. Normal S1/S2. No murmurs, rubs, gallops, or clicks.  Abdomen: +BS, soft, NT, ND.  No rebound or guarding present.  No masses.  Extrems: 2+ distal pulses of the upper and lower extremities.  Brisk capillary refill.  There is edema at the lateral aspect of her left LE.  +TTP of this region. Pink, warm, and dry.  Neurologic: Cranial nerves two through 12 fully intact.  Normal motor and sensory function in the upper and lower extremities.  Normal gait.  Normal speech and comprehension.    Data/Tests:     Images Reviewed by me: Left knee, left tibia/fibular left ankle, right hand x-rays- No acute osseous injury to the knee is seen. Distal fibular fracture, Weber B. No acute fracture, subluxations, dislocations, radiopaque foreign bodies, soft tissue swelling or joint effusion is identified.   Image Reports Review by me: As above.    Clinical Impression: Distal fibula fracture.        MDM:   The patient is noted to be neurovascularly intact in her distal LLE.  She has been seen and evaluated by the orthopedic service.    ED Course: The patient's care was discussed with the orthopedic service.  Repeat heart rate was equal to 90.      Plan: Follow-up with orthopedics as instructed by them.  RTED prn any new/worsening symptoms.  Standardized fibula fracture instructions have been provided.    Dispo: Discharged to home.      CRITICAL CARE: None

## 2007-07-17 ENCOUNTER — Encounter (INDEPENDENT_AMBULATORY_CARE_PROVIDER_SITE_OTHER): Payer: MEDICAID | Admitting: Orthopaedics Foot

## 2007-07-21 ENCOUNTER — Ambulatory Visit (INDEPENDENT_AMBULATORY_CARE_PROVIDER_SITE_OTHER): Payer: Self-pay | Admitting: ORTHOPEDIC, SPORTS MEDICINE

## 2007-07-24 ENCOUNTER — Ambulatory Visit (INDEPENDENT_AMBULATORY_CARE_PROVIDER_SITE_OTHER): Payer: MEDICAID

## 2007-07-24 ENCOUNTER — Other Ambulatory Visit (INDEPENDENT_AMBULATORY_CARE_PROVIDER_SITE_OTHER): Payer: Self-pay | Admitting: ORTHOPEDIC, SPORTS MEDICINE

## 2007-07-24 ENCOUNTER — Encounter (INDEPENDENT_AMBULATORY_CARE_PROVIDER_SITE_OTHER): Payer: MEDICAID | Admitting: Orthopaedics Foot

## 2007-07-24 ENCOUNTER — Other Ambulatory Visit (INDEPENDENT_AMBULATORY_CARE_PROVIDER_SITE_OTHER): Payer: Self-pay | Admitting: Orthopaedics Foot

## 2007-07-24 ENCOUNTER — Ambulatory Visit (INDEPENDENT_AMBULATORY_CARE_PROVIDER_SITE_OTHER): Payer: MEDICAID | Admitting: ORTHOPEDIC, SPORTS MEDICINE

## 2007-07-27 ENCOUNTER — Ambulatory Visit (INDEPENDENT_AMBULATORY_CARE_PROVIDER_SITE_OTHER): Payer: MEDICAID

## 2007-07-31 ENCOUNTER — Encounter (HOSPITAL_COMMUNITY): Payer: MEDICAID

## 2007-07-31 ENCOUNTER — Encounter (INDEPENDENT_AMBULATORY_CARE_PROVIDER_SITE_OTHER): Payer: MEDICAID

## 2007-08-07 ENCOUNTER — Encounter (INDEPENDENT_AMBULATORY_CARE_PROVIDER_SITE_OTHER): Payer: MEDICAID | Admitting: Family Medicine

## 2007-08-07 ENCOUNTER — Other Ambulatory Visit (INDEPENDENT_AMBULATORY_CARE_PROVIDER_SITE_OTHER): Payer: Self-pay | Admitting: Orthopaedics Foot

## 2007-08-07 ENCOUNTER — Encounter (INDEPENDENT_AMBULATORY_CARE_PROVIDER_SITE_OTHER): Payer: MEDICAID | Admitting: Orthopaedics Foot

## 2007-08-07 ENCOUNTER — Encounter (INDEPENDENT_AMBULATORY_CARE_PROVIDER_SITE_OTHER): Payer: MEDICAID | Admitting: ORTHOPEDIC, SPORTS MEDICINE

## 2007-08-07 ENCOUNTER — Ambulatory Visit (INDEPENDENT_AMBULATORY_CARE_PROVIDER_SITE_OTHER): Payer: MEDICAID

## 2007-08-14 ENCOUNTER — Ambulatory Visit
Admission: RE | Admit: 2007-08-14 | Discharge: 2007-08-14 | Disposition: A | Discharge status: Home / Self Care | Payer: MEDICAID | Attending: Otolaryngology | Admitting: Otolaryngology

## 2007-08-14 ENCOUNTER — Ambulatory Visit (HOSPITAL_BASED_OUTPATIENT_CLINIC_OR_DEPARTMENT_OTHER): Admit: 2007-08-14 | Discharge: 2007-08-14 | Disposition: A | Discharge status: Home / Self Care | Payer: MEDICAID

## 2007-08-14 ENCOUNTER — Ambulatory Visit (INDEPENDENT_AMBULATORY_CARE_PROVIDER_SITE_OTHER): Payer: MEDICAID

## 2007-08-14 ENCOUNTER — Other Ambulatory Visit (INDEPENDENT_AMBULATORY_CARE_PROVIDER_SITE_OTHER): Payer: Self-pay | Admitting: Otolaryngology

## 2007-08-14 DIAGNOSIS — E041 Nontoxic single thyroid nodule: Secondary | ICD-10-CM | POA: Insufficient documentation

## 2007-08-28 ENCOUNTER — Ambulatory Visit (INDEPENDENT_AMBULATORY_CARE_PROVIDER_SITE_OTHER): Payer: MEDICAID

## 2007-08-28 ENCOUNTER — Encounter (INDEPENDENT_AMBULATORY_CARE_PROVIDER_SITE_OTHER): Payer: MEDICAID | Admitting: Orthopaedics Foot

## 2007-08-28 ENCOUNTER — Other Ambulatory Visit (INDEPENDENT_AMBULATORY_CARE_PROVIDER_SITE_OTHER): Payer: Self-pay | Admitting: Orthopaedics Foot

## 2007-09-25 ENCOUNTER — Encounter (INDEPENDENT_AMBULATORY_CARE_PROVIDER_SITE_OTHER): Payer: MEDICAID

## 2007-09-25 ENCOUNTER — Other Ambulatory Visit (INDEPENDENT_AMBULATORY_CARE_PROVIDER_SITE_OTHER): Payer: Self-pay | Admitting: Family Medicine

## 2007-09-25 ENCOUNTER — Other Ambulatory Visit (INDEPENDENT_AMBULATORY_CARE_PROVIDER_SITE_OTHER): Payer: Self-pay | Admitting: Orthopaedics Foot

## 2007-09-25 ENCOUNTER — Ambulatory Visit (INDEPENDENT_AMBULATORY_CARE_PROVIDER_SITE_OTHER): Payer: MEDICAID

## 2007-09-25 ENCOUNTER — Encounter (INDEPENDENT_AMBULATORY_CARE_PROVIDER_SITE_OTHER): Payer: MEDICAID | Admitting: Orthopaedics Foot

## 2007-09-25 DIAGNOSIS — F339 Major depressive disorder, recurrent, unspecified: Secondary | ICD-10-CM

## 2007-10-06 ENCOUNTER — Ambulatory Visit (INDEPENDENT_AMBULATORY_CARE_PROVIDER_SITE_OTHER): Payer: Self-pay | Admitting: Rural Health

## 2007-10-07 ENCOUNTER — Ambulatory Visit (INDEPENDENT_AMBULATORY_CARE_PROVIDER_SITE_OTHER): Payer: MEDICAID

## 2007-10-27 ENCOUNTER — Encounter (INDEPENDENT_AMBULATORY_CARE_PROVIDER_SITE_OTHER): Payer: MEDICAID | Admitting: Otolaryngology

## 2007-10-30 ENCOUNTER — Other Ambulatory Visit (INDEPENDENT_AMBULATORY_CARE_PROVIDER_SITE_OTHER): Payer: Self-pay | Admitting: Orthopaedics Foot

## 2007-10-30 ENCOUNTER — Encounter (INDEPENDENT_AMBULATORY_CARE_PROVIDER_SITE_OTHER): Payer: MEDICAID | Admitting: Orthopaedics Foot

## 2007-10-30 ENCOUNTER — Ambulatory Visit (INDEPENDENT_AMBULATORY_CARE_PROVIDER_SITE_OTHER): Payer: MEDICAID

## 2007-10-31 ENCOUNTER — Encounter (INDEPENDENT_AMBULATORY_CARE_PROVIDER_SITE_OTHER): Payer: MEDICAID | Admitting: Otolaryngology

## 2007-11-16 ENCOUNTER — Ambulatory Visit (INDEPENDENT_AMBULATORY_CARE_PROVIDER_SITE_OTHER): Payer: Self-pay | Admitting: Otolaryngology

## 2007-11-16 ENCOUNTER — Other Ambulatory Visit (HOSPITAL_COMMUNITY): Payer: Self-pay | Admitting: Otolaryngology

## 2007-11-16 NOTE — Telephone Encounter (Signed)
[name] from the sleep lab was called by Enos Fling and requsition was sent.

## 2007-11-16 NOTE — Telephone Encounter (Signed)
Triage Queue message copied by Reeves Dam on Thu Nov 16, 2007 2:55 PM  ------   Message from: Saverio Danker   Created: Thu Nov 16, 2007 1:20 PM    >> Saverio Danker Thu Nov 16, 2007 1:20 pm  Armeni patient. Rubin Payor from sleep study called, please fax order for 11/17/2007 appt to [phone]. Please call Rubin Payor @ above # if needed. Thanks

## 2007-11-17 ENCOUNTER — Ambulatory Visit: Payer: MEDICAID | Attending: Otolaryngology

## 2007-11-17 ENCOUNTER — Ambulatory Visit (INDEPENDENT_AMBULATORY_CARE_PROVIDER_SITE_OTHER): Payer: MEDICAID | Admitting: GENERAL

## 2007-11-17 DIAGNOSIS — R404 Transient alteration of awareness: Secondary | ICD-10-CM | POA: Insufficient documentation

## 2007-11-17 DIAGNOSIS — R0609 Other forms of dyspnea: Secondary | ICD-10-CM | POA: Insufficient documentation

## 2007-11-17 NOTE — H&P (Signed)
[facility] Department of Medicine  [address]  [place], [place] [address]      GI H&P    PATIENT NAME: [name], [name]  CHART NUMBER: [id number]  DATE OF BIRTH: 19-Mar-1956  DATE OF SERVICE: 11/17/2007    CHIEF COMPLAINT: Worsening acid reflux; dysphagia.    HISTORY OF PRESENT ILLNESS: This is a 52 year old female who has had reflux symptoms for greater than 10 years. She states the symptoms occasionally get better and occasionally get worse. Starting in November 2008, the reflux pain became much worse. She now has a persistent sharp pain in the back of her throat at the level of the Adam's apple and a sensation of dysphagia with accompanying odynophagia, especially to pills and solid food, at the bottom of her throat.   She denies any food impactions or emesis. She also has intermittent chest pressure. The pain in her throat does not have any relationship to the type or amount of food she eats, although it seems to worsen if she doesn't eat. She also has an acid taste in her mouth at times, which worsens when she lies flat. She had tried Protonix and Prilosec in the past, which did not help. She is currently taking daily Nexium, which has helped with her symptoms. She takes Advil 1-2 times per day for ankle pain. She denies any current nausea or vomiting, but states that she did have nausea prior to starting Nexium. She denies any hematemesis.   She denies any abdominal pain. She has constipation, passing stool every 2 days, but denies any diarrhea. She denies any melena or bright red blood per rectum. Per the patient, she had a colonoscopy performed at [facility] more than 10 years ago due to abdominal pain. She underwent an EGD on 02/20/04, which showed mild gastritis. The biopsies of the stomach and duodenum were both within normal limits. The patient notes that she has tried sleeping on a foam wedge in the past, but always ends up waking up with the wedge on the floor. She drinks three cups of coffee  per day, and states she gets withdrawal headaches when she tries to cut down. She has tried cutting down her food intake and trying small frequent meals for the past 2 weeks, and notes some improvement of her symptoms. However, she sometimes eats crackers at night as a "midnight snack". The patient presents alone today for further evaluation.     REVIEW OF SYSTEMS: Per HPI.  She has occasional chest pain and shortness of breath. She has joint pain, especially at her ankle.  She has nocturia and she has frequent awakenings at night. Her review of systems is negative for fevers, chills, weight loss, rashes, vision changes, swollen glands, dysuria, paresthesias, diabetes, or hyperthyroidism (she does have a thyroid nodule, which was negative on biopsy.)    PAST MEDICAL HISTORY: Chronic allergic rhinitis, laryngopharyngeal reflux, globus sensation, GERD, chronic headaches, depression, broken ankle on the left, arthritis, and a right thyroid nodule - negative on FNA from 08/14/07.      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    FAMILY HISTORY: The patient denies any family history of colon cancer or other GI cancers, Crohn's disease, ulcerative colitis, celiac disease, or liver disease. Her mother had diabetes, hypertension, and heart disease.  One sister has hypertension and heart disease.  Another sister has thyroid problems.  Her father had heart disease.  A paternal uncle died of bone cancer in his 62s and a paternal aunt had breast cancer in her 16s.    SOCIAL HISTORY:  The patient previously smoked 1 pack per day for 40 years, but quit in February 2008. She denies any use of alcohol or recreational drugs.  She drinks 2-3 cups of coffee per day, but denies any use of soda.  She is currently divorced.  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    ALLERGIES:  Codeine caused a rash.    MEDICATIONS:  1.  Effexor 225 mg p.o. daily.  2.  Nexium 40 mg p.o. daily.  3.  Astelin 2 sprays in each nostril as needed.   4.  Advil p.r.n.    She denies any use of over-the-counter medications or herbal medications.    PHYSICAL EXAMINATION: Vital signs:  Height 5 feet 6 inches, weight 194.6 pounds, temperature 37.3 degrees Centigrade, pulse 103, O2 saturation 97% on room air, and blood pressure 130/74.    General:  In no acute distress.  Skin:  No rashes, no lesions, no jaundice. Lymph:  The left ankle is in a cast.  The right ankle has no pretibial edema. Head:  NC/AT.  Eyes:  EOMI, PERRLA, no scleral icterus.  ENT:  Mucous membranes are moist and intact.    Chest:  Clear to auscultation bilaterally posteriorly.  Cardiovascular:  Regular rate and rhythm, no murmurs, rubs, or gallops.  Abdomen:  Soft, nontender, nondistended.  Bowel sounds are present.  No hepatosplenomegaly by palpation.    The patient does have reflux symptoms with acid in her throat when the epigastric area is palpated but denies abdominal pain.    Extremities:  2+ bilateral radial pulses.    Neurological:  Alert and oriented x3, with appropriate affect.    IMPRESSION: This is a 52 year old female with a long history of acid reflux who presents with worsening reflux symptoms, pain and globus sensation in the back of her throat, and dysphagia to solids.    RECOMMENDATIONS:  1.  The patient was given handouts on reflux guidelines.  2.  The patient will be scheduled for an EGD to evaluate for gastritis, esophagitis, esophageal rings, peptic ulcer disease, etc. If the EGD is negative, we will then schedule her for a manometry and pH probe.  3.  The patient was told that if her reflux symptoms worsen, she can increase her Nexium to twice daily administration.   4.  The patient will follow up with our clinic in 3 months.    The patient was seen and examined by Dr. Hubert Azure in clinic today,  and he agrees with the above evaluation and recommendations.      Donnajean Lopes, MD  Fellow, Section of Gastroenterology  [place] [facility]    STAFF:  ======  I personally evaluated this patient with the GI fellow :  I interviewed her  and confirmed the history and findings as summarized above.  I reviewed the referral information and records available at this time.  I discussed the case, evaluation, and management plans with the GI fellow.  I answered the patient's questions and saw that her  follow-up was arranged.  I agree with the A & P.  ================================================================        Florence Canner, MD  Associate Professor  Section of Digestive Diseases  [facility]    [id number]; D: 11/17/2007 12:03:41; T: 11/17/2007 14:57:02  cc: Theola Sequin MD      Alda Ponder MD      Shirleen Schirmer

## 2007-11-18 ENCOUNTER — Encounter (INDEPENDENT_AMBULATORY_CARE_PROVIDER_SITE_OTHER): Payer: Self-pay | Admitting: Family Medicine

## 2007-11-27 ENCOUNTER — Encounter (INDEPENDENT_AMBULATORY_CARE_PROVIDER_SITE_OTHER): Payer: Self-pay | Admitting: Orthopaedics Foot

## 2007-11-28 ENCOUNTER — Encounter (HOSPITAL_COMMUNITY): Payer: MEDICAID

## 2007-12-01 ENCOUNTER — Other Ambulatory Visit (INDEPENDENT_AMBULATORY_CARE_PROVIDER_SITE_OTHER): Payer: Self-pay | Admitting: Otolaryngology

## 2007-12-01 ENCOUNTER — Ambulatory Visit (INDEPENDENT_AMBULATORY_CARE_PROVIDER_SITE_OTHER): Payer: MEDICAID | Admitting: Otolaryngology

## 2007-12-08 NOTE — Progress Notes (Signed)
[facility] Department of Otolaryngology  [address]  [place], [place] [address]      PROGRESS NOTE    PATIENT NAME: [name], [name]  CHART NUMBER: [id number]  DATE OF BIRTH: 1956/02/07  DATE OF SERVICE: 12/01/2007    SUBJECTIVE: Ms. Jenayah Antu is a 52 year old woman who is followed in the ENT clinic for several ENT problems including allergic rhinitis, laryngopharyngeal reflux, multinodular goiter. On her most recent visit, we discussed some problems she has noted with sleep habits including difficulty staying asleep, awakening with gasping sensations and daytime somnolence. She has also had morning headaches and headaches throughout the day. I was suspicious that she may have sleep related breathing disturbance and recommend overnight polysomnograph. She underwent a sleep study here in the recent past. This did not reveal any significant apnea or hypopneas and I discussed the results with her. She had less than ideal sleep efficiency and limited time in deep sleep. She is here now with her daughter for recheck. She reports that she has had a left ear infection with pain deep in her left ear and radiating into her neck and just the front of her ear. She states she was seen by urgent care and was given a prescription for antibiotics, which she has completed though the pain persists. She denies any change in auditory acuity. No drainage, fevers or chills.    OBJECTIVE: This is a middle-aged lady in no distress. Ears, AS EACs clear, TMs normal. There is no fluid or drainage seen. The right ear is clear. Nose: Septum is slightly deviated toward the left side, but otherwise clear. Oral cavity and oropharynx unremarkable. She has tenderness to palpation of the left pterygoid musculature as well as palpation of the left temporomandibular joint with range of motion, consistent with temporomandibular joint myofascitis. IDLs are deferred. Neck: No lymphadenopathy or mass.     ASSESSMENT AND PLAN:    1. 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 Recommend warm compresses, soft diet, and Motrin.   2. She has history of allergic rhinitis, which is stable.   3. Laryngopharyngeal reflux, stable.  4. Multinodular goiter, right thyroid nodule measuring 1.5 cm in largest dimension. I recommend followup in six months with a repeat ultrasound guided FNA if possible and will see her back thereafter.      Charmaine Downs, MD  Assistant Professor  [facility] Department of Otolaryngology    [id number]; D: 12/01/2007 17:39:05; T: 12/04/2007 11:42:46

## 2007-12-11 ENCOUNTER — Encounter (INDEPENDENT_AMBULATORY_CARE_PROVIDER_SITE_OTHER): Payer: MEDICAID | Admitting: Orthopaedics Foot

## 2007-12-18 ENCOUNTER — Ambulatory Visit
Admission: RE | Admit: 2007-12-18 | Discharge: 2007-12-18 | Disposition: A | Discharge status: Home / Self Care | Payer: MEDICAID | Attending: Otolaryngology | Admitting: Otolaryngology

## 2007-12-18 ENCOUNTER — Ambulatory Visit (INDEPENDENT_AMBULATORY_CARE_PROVIDER_SITE_OTHER): Payer: MEDICAID

## 2007-12-18 ENCOUNTER — Other Ambulatory Visit (INDEPENDENT_AMBULATORY_CARE_PROVIDER_SITE_OTHER): Payer: Self-pay | Admitting: Otolaryngology

## 2007-12-18 DIAGNOSIS — E042 Nontoxic multinodular goiter: Secondary | ICD-10-CM | POA: Insufficient documentation

## 2007-12-19 ENCOUNTER — Encounter (HOSPITAL_COMMUNITY): Payer: Self-pay | Admitting: GASTROENTEROLOGY

## 2007-12-19 ENCOUNTER — Encounter (HOSPITAL_COMMUNITY): Payer: MEDICAID | Admitting: GASTROENTEROLOGY

## 2007-12-19 ENCOUNTER — Encounter (HOSPITAL_COMMUNITY)
Admission: RE | Disposition: A | Discharge status: Home / Self Care | Payer: Self-pay | Source: Ambulatory Visit | Attending: GASTROENTEROLOGY

## 2007-12-19 ENCOUNTER — Other Ambulatory Visit (HOSPITAL_COMMUNITY): Payer: Self-pay | Admitting: GASTROENTEROLOGY

## 2007-12-19 ENCOUNTER — Inpatient Hospital Stay
Admission: RE | Admit: 2007-12-19 | Discharge: 2007-12-19 | Disposition: A | Discharge status: Home / Self Care | Payer: MEDICAID | Attending: GASTROENTEROLOGY | Admitting: GASTROENTEROLOGY

## 2007-12-19 HISTORY — PX: PB UPPER GI ENDOSCOPY,BIOPSY: 43239

## 2007-12-19 SURGERY — GASTROSCOPY
Anesthesia: IV Sedation | Site: Mouth | Laterality: Left | Wound class: Clean Contaminated Wounds-The respiratory, GI, Genital, or urinary

## 2007-12-19 MED ORDER — FENTANYL (PF) 50 MCG/ML INJECTION SOLUTION
INTRAMUSCULAR | Status: AC
Start: 2007-12-19 — End: 2007-12-19
  Administered 2007-12-19: 50 ug via INTRAVENOUS
  Filled 2007-12-19: qty 2

## 2007-12-19 MED ORDER — TETRACAINE 2 % TOPICAL SOLUTION
Freq: Once | CUTANEOUS | Status: DC | PRN
Start: 2007-12-19 — End: 2007-12-19
  Administered 2007-12-19: 20 mL via TOPICAL

## 2007-12-19 MED ORDER — MIDAZOLAM 1 MG/ML INJECTION SOLUTION
1.00 mg | Freq: Once | INTRAMUSCULAR | Status: DC | PRN
Start: 2007-12-19 — End: 2007-12-19

## 2007-12-19 MED ORDER — FENTANYL (PF) 50 MCG/ML INJECTION SOLUTION
25.00 ug | Freq: Once | INTRAMUSCULAR | Status: DC | PRN
Start: 2007-12-19 — End: 2007-12-19
  Filled 2007-12-19: qty 1

## 2007-12-19 MED ORDER — SODIUM CHLORIDE 0.9 % INTRAVENOUS SOLUTION
INTRAVENOUS | Status: DC
Start: 2007-12-19 — End: 2007-12-19

## 2007-12-19 MED ORDER — MIDAZOLAM 1 MG/ML INJECTION SOLUTION
INTRAMUSCULAR | Status: AC
Start: 2007-12-19 — End: 2007-12-19
  Administered 2007-12-19 (×3): 1 mg via INTRAVENOUS
  Filled 2007-12-19: qty 10

## 2007-12-19 SURGICAL SUPPLY — 66 items
BASIN EME 8.4X3.8X2IN GRAD DISP DST ROSE POLYPROP C500ML LF (PATU) IMPLANT
BLOCK BITE 20MM PE ADULT MOUTHPC STRAP RETENTION RIM LUM SCPSVR LF  LRG 27MM GRN NONST DISP (AIR) ×1 IMPLANT
BLOCK BITE 27FR INFANT BITEBLOCS PEDIABLOC LF  DISP (AIR) IMPLANT
BRUSH CLEANING CS6021T PK/10 TRI-BRISTLE DISP (CLEN) ×1 IMPLANT
BRUSH CYTO 230CM 2.4MM INFNT ROT HNDL (SURGICAL INSTRUMENTS) ×1 IMPLANT
BRUSH CYTOLOGY GASTRO 3MM 160CN BX/20 00711402 (RESPIRATORY/AIRWAY MGMT) IMPLANT
CANNULA INJ 17GA NDLS SYRG BLUNT STRL LF  10ML BD INTRLNK PLASTIC BXTR INTLNK ABT LS MCGAW SAFELINE (IV TUBING & ACCESSORIES) IMPLANT
CANNULA INJ 20GA 17GA 2 DEV HUB CAP BLUNT STRL LF  RD GRN BD TWINPAK STL PLASTIC (IV TUBING & ACCESSORIES) ×2 IMPLANT
CANNULA NASAL 14FT ANGL FLXB LIP PLATE CRSH RS LUM TUBE NFLR TP ADULT ARLF UCIT STD CURVE LF  DISP (CANNULA) IMPLANT
CANNULA NASAL 7FT ANGL FLXB LIP PLATE CRSH RS LUM TUBE FLR TIP ADULT ARLF UCIT STD CURVE LF  DISP (CANNULA) IMPLANT
CATH CRE 7.5FR 12-15MM 5.5CM 240CM WG BAL DIL ESOPH COLON PYL PEBAX STRL LF  2.8MM WRK CHNL F/G (Dilators) IMPLANT
CATH CRE 7.5FR 15-18MM 5.5CM 240CM WG BAL DIL ESOPH COLON PYL PEBAX STRL LF  2.8MM WRK CHNL F/G (Dilators) IMPLANT
CATH ELHMST GLD PROBE 10FR 300CM BIPOLAR RND DIST TIP STD CONN FIRM SHAFT HMGLD STRL DISP 3.7MM MN (DIAGNOSTIC) IMPLANT
CATH ELHMST GLD PROBE 7FR 300CM BIPOLAR RND DIST TIP STD CONN FIRM SHAFT HMGLD STRL DISP 2.8MM MN (DIAGNOSTIC) IMPLANT
CATH SUCT ARLF TRIFLO 18FR 2 3ANG EYE BVL TIP CONN CONTROL PORT STRL LF  DISP CLR (Suction) IMPLANT
CLIP HMST RADOPQ PRELD STRL DISP RSL 235CM 2.8MM 11MM OPN (SURGICAL INSTRUMENTS) IMPLANT
CONV USE 48053 - SYRINGE MONOJECT 3ML LF  STRL LL TIP GRAD MED POLYPROP STD DISP (Syringes w/ o Needles) ×1 IMPLANT
CONV USE ITEM 343591 - SOLIDIFY FLUID 1500ML DSPNSR L_Q TX SOLIDIFY SFTP LTS+ DISP (STER) ×1 IMPLANT
DEVICE INFNT STFR BRSTL CYTO 2.4MM 230CM STRL LF  DISP (BIOMEDICAL) IMPLANT
DILATOR ENDOS CRE 180CM 5.5CM 10-11-12MM 7.5FR ESOPH PYL COLON BAL LOW PROF GW PEBAX STRL LF  DISP (BALLOON) IMPLANT
DILATOR ENDOS CRE 180CM 5.5CM 12-13.5-15MM 7.5FR ESOPH PYL COLON BAL LOW PROF GW PEBAX STRL LF  DISP (BALLOON) IMPLANT
DILATOR ENDOS CRE 180CM 5.5CM 6-7-8MM 7.5FR ESOPH PYL BIL BAL LOW PROF GW PEBAX STRL DISP 2.8MM 3.2 (BALLOON) IMPLANT
DILATOR ENDOS CRE 180CM 5.5CM 7.5FR 8-9-10MM ESPH BIL PYL GW BAL LF  DISP ACPT .035IN GW (BALLOON) IMPLANT
DILATOR ENDOS CRE 180CM 8CM 10-11-12MM 6FR ESOPH BAL LOW PROF FIX WRE PEBAX STRL LF  DISP 2.8MM (BALLOON) IMPLANT
DILATOR ENDOS CRE 180CM 8CM 15-16.5-18MM 6FR ESOPH BAL LOW PROF FIX WRE PEBAX STRL LF  DISP 2.8MM (BALLOON)
DILATOR ENDOS CRE 180CM 8CM 6-7-8MM 6FR ESOPH BAL LOW PROF FIX WRE PEBAX STRL LF  DISP 2.8MM (BALLOON) IMPLANT
DILATOR ENDOS CRE 180CM 8CM 618-19-20MM 6FR ESOPH PYL FIX WRE BAL PEBAX STRL DISP ACPT .035IN GW 2.8 (BALLOON) IMPLANT
DILATOR ENDOS CRE 180CM 8CM 6FR 12-13.5-15MM ESOPH FIX WRE BAL RND SHLDR PEBAX STRL LF  DISP (GI LAB SUPPLIES) IMPLANT
DILATOR ENDOS CRE 180CM 8CM 8-9-10MM 6FR ESOPH BAL LOW PROF FIX WRE PEBAX STRL LF  DISP 2.8MM (GI LAB SUPPLIES) IMPLANT
DISC USE 402689 - ROTH NET 2.5MM X 160CM STD_00711053 BX/5 (Dilators) IMPLANT
DISCONTINUED USE ITEM 339015 - CONTAINR STRL 10% NEUT BF FRMLN POLYPROP GRAD LEAK RST ORNG PREFL SCREW CAP FSHR HLTHCR PRTCL GRN (CHEM) IMPLANT
DISCONTINUED USE ITEM 82101 - TUBING OXYGEN 50/CS 001302 (TUBE/TUBING & SUCTION SUPPLIES) IMPLANT
DUPE USE ITEM 301092 - DILATOR ENDOS CRE 180CM 8CM 15-16.5-18MM 6FR ESOPH BAL LOW PROF FIX WRE PEBAX STRL LF  DISP 2.8MM (BALLOON) IMPLANT
ELECTRODE PATIENT RTN 9FT VLAB C30- LB RM PHSV ACRL FOAM CORD NONIRRITATE NONSENSITIZE ADH STRP (CAUTERY SUPPLIES) IMPLANT
FILTER PROBE SIDE FIRE 2.3MM 20132-217 INTEGRATED BX/10 (Dilators) IMPLANT
FORCEPS BIOPSY 160CM 1.8MM RJ 4 PED 2+ MM DISP GASTROSCOPIC (SURGICAL INSTRUMENTS) IMPLANT
FORCEPS BIOPSY HOT 240CM 2.2MM RJ 4 +2.8MM DISP (INSTRUMENTS)
FORCEPS BIOPSY HOT 240CM 2.2MM RJ 4 +2.8MM DISPO (SURGICAL INSTRUMENTS) IMPLANT
FORCEPS BIOPSY MICROMESH TTH STREAMLINE CATH 240CM 2.4MM RJ 4 SS LRG CPC STRL DISP ORNG 2.8MM WRK (GUIDING) IMPLANT
FORCEPS BIOPSY NEEDLE 240CM 2.2MM RJ 4 2.8MM STD CPC STRL DISP ORNG (SURGICAL INSTRUMENTS) ×1 IMPLANT
LIGATOR 122MM 9.5-13MM 6SHTR SA 6 BAND TRGR CORD NONST DISP ENDOS ESOPH VARICES NATURAL RUB LTX (SUTURE STAPLING DEVICES) IMPLANT
LINER SUCT RD CRD MEDIVAC TW LOCK LID SHTOF VALVE CAN FILTER 1500CC LF  DISP (Suction) ×1 IMPLANT
LOOP ENDO DETACH 20MM MAJ340 (GU) IMPLANT
LOOP HLDR DTCH AUTOCLAV 30MM SURG NYL PLPCTM NONST LF  DISP (ENDOSCOPIC SUPPLIES) IMPLANT
NEEDLE SCLRTX 25GA 2.3MM OPTC TIP SHTH STRL LF DISP YW (NEEDLES & SYRINGE SUPPLIES) IMPLANT
NET SPEC RETR 160CM 3MM RTHNT MAXI SHEATH 8X4CM NONST LF  DISP (Dilators) IMPLANT
NET SPEC RETR 230CM 2.5MM RTHNT STD SHEATH 6X3CM NONST LF  DISP (Dilators) IMPLANT
OVERTUBE ESOPH BX/3 00711146 (RESPIRATORY/AIRWAY MGMT) IMPLANT
PAD ENDOSCP MAINT 5X3.5IN KRINKLE CONTOUR RIDGE (TOOL) ×1 IMPLANT
PROBE ESURG 220CM 2.3MM FIAPC FLXB STR FIRE STRL DISP (CAUTERY SUPPLIES) IMPLANT
RETRIEVER ENDOS 160CM 1.8MM RTHNT MINI SM CATH SHEATH 4.5X2CM NONST (Dilators) IMPLANT
SET IV UNIV EXT DUAL Y W/SLIDE CLMP NEEDLELESS 2C6612 (IV TUBING & ACCESSORIES) IMPLANT
SNARE MED OVAL 240CM 2.4MM SNS LOOP SHRTHRW FLXB ENDOS 2.8MM WRK CHNL PLPCTM 27MM STRL DISP (ENDOSCOPIC SUPPLIES) IMPLANT
SNARE MED OVAL 240CM 2.4MM SNS_LOOP SHRTHRW FLXB ENDOS 2.8MM (INSTRUMENTS ENDOMECHANICAL)
SNARE SM OVAL 240CM 2.4MM SNS LOOP SHRTHRW FLXB ENDOS PLPCTM 13MM STRL LF  DISP (DIAGNOSTIC) IMPLANT
SOLIDIFY FLUID 1500ML DSPNSR L_Q TX SOLIDIFY SFTP LTS+ DISP (STER) ×1
SYRINGE 5ML LF  STRL ST GRAD MED POLYPROP DISP (NEEDLES & SYRINGE SUPPLIES) ×1 IMPLANT
SYRINGE 60CC LUER LOCK 25/BX 1186600777 (Syringes w/ o Needles) IMPLANT
SYRINGE INFLAT ALN II GA STRL DISP 60ML (NEEDLES & SYRINGE SUPPLIES) IMPLANT
TRAP MUCOUS SPEC 10FR MST4000 (ANETHESIA SUPPLIES) IMPLANT
TRAY GASTRIC LAV 36IN 24FR ARGYLE EDLICH MONOJECT MED PVC 4 EYE CLS END GRAD SYRG TRNSPR 140CC PED (TRAY) IMPLANT
TRAY GASTRIC LAV 36IN 34FR ARGYLE EDLICH MONOJECT LRG PVC 4 EYE CLS END GRAD SYRG TRNSPR 140CC NONST (TRAY) IMPLANT
TUBING SUCT CLR 20FT 9/32IN MEDIVAC NCDTV M/M CONN STRL LF (Suction) IMPLANT
TUBING SUCT CLR 6FT 3/16IN MEDIVAC MXGR MALE TO MALE CONN NCDTV STRL LF (Suction) ×1 IMPLANT
TUBING SUCTION 7MM X 12FT N712 CS/20 (Connecting Tubes/Misc) ×1 IMPLANT
WATER STRL 500ML PLASTIC PR BTL LF (SOLUTIONS) ×1 IMPLANT

## 2007-12-19 NOTE — OR Nursing (Signed)
Fentanyl 50 mcgs 9:58 IV    1018 Abd soft No pain.

## 2007-12-19 NOTE — OR Nursing (Signed)
Loc 2 No complaint of pain, abd soft.  NPO since 6:30 pm yesterday.  No tatoos.  Skin intact.  Upper denture.

## 2007-12-19 NOTE — Discharge Instructions (Signed)
SURGICAL DISCHARGE INSTRUCTIONS     Dr. Florence Canner  performed your GASTROSCOPY today at the [facility]    [facility]:  Monday through Friday from 6 a.m. - 7 p.m.: [phone]  Between 7 p.m. - 6 a.m., weekends and holidays:  Call [facility] at [phone] or [phone].    PLEASE SEE WRITTEN HANDOUTS AS DISCUSSED BY YOUR NURSE:  gastroscopy    SIGNS AND SYMPTOMS OF A WOUND / INCISION INFECTION   Be sure to watch for the following:   Increase in redness or red streaks near or around the wound or incision.  Increase in pain that is intense or severe and cannot be relieved by the pain medication that your doctor has given you.  Increase in swelling that cannot be relieved by elevation of a body part, or by applying ice, if permitted.  Increase in drainage, or if yellow / green in color and smells bad. This could be on a dressing or a cast.  Increase in fever for longer than 24 hours, or an increase that is higher than 101 degrees Fahrenheit (normal body temperature is 98 degrees Fahrenheit). The incision may feel warm to the touch.    **CALL YOUR DOCTOR IF ONE OR MORE OF THESE SIGNS / SYMPTOMS SHOULD OCCUR.    ANESTHESIA INFORMATION   ANESTHESIA -- ADULT PATIENTS:  You have received intravenous sedation / general anesthesia, and you may feel drowsy and light-headed for several hours. You may even experience some forgetfulness of the procedure. DO NOT DRIVE A MOTOR VEHICLE or perform any activity requiring complete alertness or coordination until you feel fully awake in about 24-48 hours. Do not drink alcoholic beverages for at least 24 hours. Do not stay alone, you must have a responsible adult available to be with you. You may also experience a dry mouth or nausea for 24 hours. This is a normal side effect and will disappear as the effects of the medication wear off.    REMEMBER   If you experience any difficulty breathing, chest pain, bleeding that you feel is excessive,  persistent nausea or vomiting or for any other concerns:  Call your physician Dr. Alto Denver at [phone] or [phone]. You may also ask to have the GI doctor on call paged. They are available to you 24 hours a day.    SPECIAL INSTRUCTIONS / COMMENTS   Esophageal manometry and 24 hr PH probe will be scheduled by the GI office.  They will notify you of the date and time  ([phone]).    Call the physician if you develop a fever greater than 101, any problem breathing or swallowing, or any persistent vomiting or vomiting bright red blood.    Follow up with primary care physician.    FOLLOW-UP APPOINTMENTS   Please call patient services at [phone] or [phone] to schedule a date / time of return. They are open Monday - Friday from 7:30 am - 5:00 pm.

## 2007-12-20 LAB — HISTORICAL SURGICAL PATHOLOGY SPECIMEN

## 2007-12-22 ENCOUNTER — Ambulatory Visit (INDEPENDENT_AMBULATORY_CARE_PROVIDER_SITE_OTHER): Payer: MEDICAID

## 2007-12-22 ENCOUNTER — Encounter (INDEPENDENT_AMBULATORY_CARE_PROVIDER_SITE_OTHER): Payer: MEDICAID | Admitting: Orthopaedics Foot

## 2007-12-22 ENCOUNTER — Other Ambulatory Visit (INDEPENDENT_AMBULATORY_CARE_PROVIDER_SITE_OTHER): Payer: Self-pay | Admitting: Orthopaedics Foot

## 2007-12-22 ENCOUNTER — Encounter (HOSPITAL_COMMUNITY): Payer: MEDICAID

## 2007-12-22 DIAGNOSIS — M79673 Pain in unspecified foot: Secondary | ICD-10-CM

## 2007-12-25 ENCOUNTER — Encounter (INDEPENDENT_AMBULATORY_CARE_PROVIDER_SITE_OTHER): Payer: MEDICAID | Admitting: Otolaryngology

## 2007-12-25 ENCOUNTER — Encounter (INDEPENDENT_AMBULATORY_CARE_PROVIDER_SITE_OTHER): Payer: Self-pay | Admitting: Family Medicine

## 2007-12-26 NOTE — Progress Notes (Signed)
[facility] Department of Orthopaedics  [address]  [place], [place] [address]      SPORTS MEDICINE PROGRESS NOTE    PATIENT NAME: [name], [name]  CHART NUMBER: [id number]  DATE OF BIRTH: 1956/02/15  DATE OF SERVICE: 12/22/2007    SUBJECTIVE:  [name] returns today now five months status post a left ankle SER II fracture, which we chose to treat nonoperatively.  The patient was here four weeks ago.  We did release her to weightbear as tolerated.  We also gave the patient a physical therapy slip, in that she was having some weakness in her leg musculature, some pain and some stiffness.  The patient said that she could not afford the gas to drive back and forth to physical therapy, so she tried to do physical therapy at home on her own.  She says she feels a lot better but is still having some pain over her distal fibula and also the dorsal surface of her metatarsals just above the MTP joint.    OBJECTIVE:  On physical examination, she does a little discoloration over the dorsal surface of her MTP joints.  This is not concerning for any type of an infection process or vascular change.  The patient does have some tenderness to palpation in the second and third intermetatarsal spaces on the distal end and towards the MTP joints.  The pain is both dorsal and plantar, and suspicious for either a Morton neuroma or a stress fracture of her metatarsals.  Otherwise, her foot is completely neurovascularly intact.  She is not having swelling around her ankle.  No other tenderness to palpation other than that.      X-RAYS:  Radiographs of her left foot taken today show no signs of any stress fractures or any other bony abnormality in her foot.    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     PLAN:  We have discussed with her that she should continue to wear the tennis shoes to ambulate.  She should continue home physical therapy, as she has been doing.  We have instructed her that if she has any of this pain in the area of her MTP joints, it increases or she starts having some swelling in this area, she should give [place] a call and we will possibly recheck her to make sure that a stress fracture is not involved here.  Otherwise, we will plan to see her back on a p.r.n.      Read Drivers, MD  Resident  [place] Department of Orthopaedics    Huntley Dec, DPM  Associate Professor  [facility] Department of Orthopaedics   See resident's note for details. I saw and evaluated the patient and agree with the resident's findings and plans as written except as noted below.  [id number]; D: 12/22/2007 11:43:24; T: 12/25/2007 13:58:12

## 2008-01-03 ENCOUNTER — Inpatient Hospital Stay
Admission: RE | Admit: 2008-01-03 | Discharge: 2008-01-03 | Disposition: A | Discharge status: Home / Self Care | Payer: MEDICAID | Attending: GASTROENTEROLOGY | Admitting: GASTROENTEROLOGY

## 2008-01-03 ENCOUNTER — Encounter (HOSPITAL_COMMUNITY)
Admission: RE | Disposition: A | Discharge status: Home / Self Care | Payer: Self-pay | Source: Ambulatory Visit | Attending: GASTROENTEROLOGY

## 2008-01-03 ENCOUNTER — Encounter (HOSPITAL_COMMUNITY): Payer: MEDICAID | Admitting: GASTROENTEROLOGY

## 2008-01-03 SURGERY — PH PROBE

## 2008-01-03 SURGICAL SUPPLY — 11 items
BASIN EME 8.4X3.8X2IN GRAD DISP DST ROSE POLYPROP C500ML LF (PATU) ×1 IMPLANT
CATH ESOPH MANOMETRY 4CH SGL USE BX/10 9012P1202 (Drains/Resovoirs) ×1 IMPLANT
CONV USE ITEM 338851 - STOPCOCK IV MEDEX HFL 4W SWVL MALE LL SM BORE LPD RST STRL LF  DISP (Connecting Tubes/Misc) ×4
DISCONTINUED NO SUB - JELLY LUB DYNALUBE BCTRST WATER SOL NGRS PKT STRL 5GM LF (WOUND CARE SUPPLY) ×2 IMPLANT
JELLY LUB EZ BCTRST H2O SOL NG_RS PKT STRL 5GM LF (WOUND CARE/ENTEROSTOMAL SUPPLY) ×2
SENSOR DUAL CATH PH 10CM PK/10 VNID-10 (Electrical Supplies) ×1 IMPLANT
SENSOR SGL CATH PH 10/PK VERSAFLEX DISP VNIS (Electrical Supplies) ×1 IMPLANT
STOPCOCK IV MEDEX HFL 4W SWVL MALE LL SM BORE LPD RST STRL LF  DISP (Connecting Tubes/Misc) ×4 IMPLANT
SYRINGE 6CC LUER LOCK 100/BX 1180600777 (Syringes w/ o Needles) ×1 IMPLANT
SYRINGE SFGLD 5/8IN 25GA 1ML LF  STRL HYPO  DTCH NEEDLE REG BVL VSB LOCK SHIELD MECH MED REG WL (NEEDLES & SYRINGE SUPPLIES) ×1 IMPLANT
WATER STRL 500ML PLASTIC PR BTL LF (SOLUTIONS) ×1 IMPLANT

## 2008-01-03 NOTE — Discharge Instructions (Signed)
SURGICAL DISCHARGE INSTRUCTIONS     Dr. Florence Canner  performed your [facility] PROBE WITH ESOPHAGEAL MANOMETRY today at the [facility]    [facility]:  Monday through Friday from 6 a.m. - 7 p.m.: [phone]  Between 7 p.m. - 6 a.m., weekends and holidays:  Call [facility] at [phone] or [phone].    PLEASE SEE WRITTEN HANDOUTS AS DISCUSSED BY YOUR NURSE:  Arcangel Minion      SIGNS AND SYMPTOMS OF A WOUND / INCISION INFECTION   Be sure to watch for the following:   Increase in redness or red streaks near or around the wound or incision.  Increase in pain that is intense or severe and cannot be relieved by the pain medication that your doctor has given you.  Increase in swelling that cannot be relieved by elevation of a body part, or by applying ice, if permitted.  Increase in drainage, or if yellow / green in color and smells bad. This could be on a dressing or a cast.  Increase in fever for longer than 24 hours, or an increase that is higher than 101 degrees Fahrenheit (normal body temperature is 98 degrees Fahrenheit). The incision may feel warm to the touch.    **CALL YOUR DOCTOR IF ONE OR MORE OF THESE SIGNS / SYMPTOMS SHOULD OCCUR.    ANESTHESIA INFORMATION   LOCAL ANESTHETIC:  You have receieved a local anesthetic, the effects should disappear in a few hours.    REMEMBER   If you experience any difficulty breathing, chest pain, bleeding that you feel is excessive, persistent nausea or vomiting or for any other concerns:  Call your physician Dr. Alto Denver at [phone] or [phone]. You may also ask to have the GI doctor on call paged. They are available to you 24 hours a day.    SPECIAL INSTRUCTIONS / COMMENTS   Follow up with Dr Alto Denver and your primary care physician as scheduled.    You may have a sore throat today and you may also notice some blood when blowing nose today.    Return the [facility] probe box tomorrow on 2 west at the registration desk.    FOLLOW-UP APPOINTMENTS    Please call patient services at [phone] or [phone] to schedule a date / time of return. They are open Monday - Friday from 7:30 am - 5:00 pm.

## 2008-01-03 NOTE — OR PostOp (Signed)
0840           Tolerated both procedures well.  All discharge instructions given. States understanding.  Discharged to homel.

## 2008-01-03 NOTE — OR Nursing (Signed)
 9174  Esophageal manometry completed.  24 hour PH probe explained to patient.  2 channel ph probe inserted into right nare without difficulty.   Catheter taped in place at 45cm.  Probe recording buttons explained and demonstrated to patient.  States understanding.  Pt tolerated procedure well. No bleeding noted from nose.

## 2008-01-03 NOTE — OR PreOp (Signed)
Pt ID and NPO status verified.  Both procedures explained to patient.  Reassurance given and all questions answered.

## 2008-01-03 NOTE — OR Nursing (Signed)
Esophageal manometry catheter placed in right nare.  Lidocaine jelly applied to catheter.  Tolerated procedure well.  No bleeding noted.  Catheter passed without difficulty.

## 2008-01-04 NOTE — OR Surgeon (Signed)
[facility]                                   DEPARTMENT OF MEDICINE                                 SECTION OF DIGESTIVE DISEASES                                      PROCEDURE REPORT                                      PATIENT NAME: [name], [name] [facility] [id number]  DATE OF BIRTH: 09-May-1956  DATE OF SERVICE:01/03/2008    HISTORY AND INDICATIONS:  Undergoing manometric evaluation for reflux.  Gastroscopy showed a patulous junction zone without any erosions, but with mild nonerosive reflux findings.  There are no contraindications to simple manometry.    PROCEDURE AND MEDICATIONS:  Esophageal motility test (91011?).  No medications.  LES estimated with station pull-through and wet swallows.  Peristalsis in the body estimated with standardized wet swallows.  Upper sphincter is not estimated.    EXAMINATION AND FINDINGS:  Her vital signs were recorded.  Her medication list was attached to her chart.  Her weight was recorded.    The lower esophageal sphincter pressure is incompletely seen, only in 2 or 3 out of her 4 sequential leads.  Best tracings appear to be in the lower 20 mm range, and this is in the midrange of normal.  Relaxation with wet swallows appears to be 3/4 or more.  This personal interpretation conflicts with the computer-calculated resting pressure, which is calculated much lower, but incorporates artifact and less reliable waveforms.    Peristalsis in the body is unremarkable except for a couple of nonspecific dropped beats.  Peristalsis was slightly hypertensive in more than half of her swallows, in the 200+ range.  Other swallows are in the normal range.  Average peristaltic contractions are in the upper range of normal, about 180, tending towards hypertensive dysmotility.    IMPRESSION AND RECOMMENDATIONS:  1.  Minor technical or artifactual limitations.  2.  Nonspecific abnormalities.   3.  LES appears to be in the normal range with relaxation, but peristalsis in the body has a dropped beat and some hypertensive contractions.  No specific primary or significant pathology noted.      Florence Canner, MD  Associate Professor  Section of Digestive Diseases  [facility]    [phone]: 01/03/2008 15:52:08; T: 01/04/2008 19:37:09    cc: Jerry Caras MD      Shirleen Schirmer

## 2008-01-05 NOTE — OR Surgeon (Signed)
[facility]                                   DEPARTMENT OF MEDICINE                                 SECTION OF DIGESTIVE DISEASES                                      PROCEDURE REPORT                                      PATIENT NAME: [name], [name] [facility] BMWUXL:[id number]  DATE OF BIRTH: 01-16-1956  DATE OF SERVICE:01/03/2008    HISTORY AND INDICATIONS:  Undergoing evaluation for reflux.  No specific contraindications to simple 24-hour pH testing.  The patient was on PPI, discontinued it a week ago for the purposes of this test, and these pH measurements are therefore done off PPI.    PROCEDURE AND MEDICATIONS:  Ambulatory continuous 24-hour esophageal pH measurement (91036).  No medications.  Transnasal catheter.  Two-channel catheter with proximal and distal Sensors.  pH threshold is 4.  Time of recording was from approximately 0900 hours on January 03, 2008, through approximately 0730 or so on January 04, 2008. The patient is also asked to keep a diary of meals, medications, subjective symptomatic events, and any other perceived problems during the 24-hour time of recording.    EXAMINATION AND FINDINGS:  Her vital signs and weight were taken and recorded.  Her medication list was reviewed.  She was specifically off PPI for about a week.    In looking at the computer-calculated measurements, all of her parameters are elevated.  Transient reflux episodes are increased, as well as prolonged reflux episodes.  Acidification time is elevated and scores are elevated.  Technically, this is a positive study.    However, there is a strong suspicion for artifact, dislodgement or malplacement.     The entire distal lead has a fairly steady baseline at pH 1 throughout without any change.  In generally interpreting the study, the possibility of leak or bubble, dislodgement or misplacement, or a large hiatal hernia, within which the catheter tip had lodged, can all be entertained.      In the proximal pH Sensor, she has numerous transient reflux episodes, a few prolonged ones and borderline elevated scores.    From the subjective point of view, she annotated multiple episodes of heartburn, many of which clearly correlated with pH drops.  However, as mentioned above, for almost 18 hours her distal Sensor was under a pH of 1 or less, and throughout that period of time she had no symptoms whatsoever.    IMPRESSION AND RECOMMENDATIONS:  Technically speaking, the calculated parameters of this pH study are all abnormal while off PPI.    However, the possibility of technical error, artifact or malplacement is entertained.    However, even in acknowledging that, in her proximal Sensor (upper esophagus) where there did not appear to be any artifact or error, she did also have borderline positive objective parameters and correlation of heartburn symptoms with transient pH drops.    This study is difficulat  to call correctly because of the possibility of technical error or artifact, but is probably a positive study on balance.      Florence Canner, MD  Associate Professor  Section of Digestive Diseases  [facility]    [id number];D: 01/04/2008 15:05:45; T: 01/05/2008 12:50:53    cc: Jerry Caras MD      Shirleen Schirmer

## 2008-01-06 ENCOUNTER — Encounter (INDEPENDENT_AMBULATORY_CARE_PROVIDER_SITE_OTHER): Payer: Self-pay | Admitting: Family Medicine

## 2008-01-08 ENCOUNTER — Telehealth (INDEPENDENT_AMBULATORY_CARE_PROVIDER_SITE_OTHER): Payer: Self-pay | Admitting: GENERAL

## 2008-01-08 NOTE — Telephone Encounter (Signed)
Dr. Rocky Crafts reviewed the patient's results with Dr. Hubert Azure including her EGD from 12/19/07, 24-hour pH probe from 01/03/08, and manometry from 01/03/08. The patient was told that per Dr. Hubert Azure, he feels the 24-hour pH probe may have been malpositioned during the study, but is probably a positive study for acid reflux while off PPIs. The manometry showed some hypertensive contractions, and nonspecific abnormalities, but was otherwise not significant for any specific primary pathology. The EGD had shown some erythema in the gastric antrum and a patulous GE junction zone, with the biopsies showing: "Final Pathologic Diagnosis A. ANTRUM, BIOPSY: No diagnostic abnormalities recognized. B. DISTAL ESOPHAGUS, BIOPSY: Basal hyperplasia and rare intramucosal lymphocytes suggestive of reflux.". The patient was told that taken all together, the studies show that she does have acid reflux, which is worse off medications. The patient states that despite daily Nexium, she continues to have reflux symptoms and is hoarse "a lot" from the reflux. Dr. Rocky Crafts informed the patient that we will need to increase her Nexium to twice daily dosing to address her symptoms. The patient is agreeable with this plan. She provided the number for her pharmacy, [facility]: [phone]. The patient will follow-up as scheduled.    After speaking with the patient, Dr. Rocky Crafts called the [facility] pharmacy and spoke to [facility]. The patient was given a new presciption for Nexium 40 mg p.o. b.i.d. with #60 dispensed and 11 refills.

## 2008-01-19 ENCOUNTER — Encounter (INDEPENDENT_AMBULATORY_CARE_PROVIDER_SITE_OTHER): Payer: MEDICAID

## 2008-02-08 ENCOUNTER — Encounter (FREE_STANDING_LABORATORY_FACILITY)
Admit: 2008-02-08 | Discharge: 2008-02-08 | Disposition: A | Discharge status: Home / Self Care | Payer: MEDICAID | Attending: EXTERNAL | Admitting: EXTERNAL

## 2008-02-08 DIAGNOSIS — L089 Local infection of the skin and subcutaneous tissue, unspecified: Secondary | ICD-10-CM | POA: Diagnosis present

## 2008-02-08 LAB — CBC/DIFF
BASOPHILS: 0 % (ref 0–1)
BASOS ABS: 0.016 THOU/uL (ref 0.0–0.2)
EOS ABS: 0.013 THOU/uL — ABNORMAL LOW (ref 0.1–0.3)
EOSINOPHIL: 0 % — ABNORMAL LOW (ref 1–6)
HCT: 39.2 % (ref 33.5–45.2)
HGB: 13.4 g/dL (ref 11.2–15.2)
LYMPHOCYTES: 40 % (ref 20–45)
LYMPHS ABS: 2.99 THOU/uL (ref 1.0–4.8)
MCH: 29.3 pg (ref 27.4–33.0)
MCHC: 34.3 g/dL (ref 31.6–35.5)
MCV: 85.3 fL (ref 78–100)
MONOCYTES: 8 % (ref 4–13)
MONOS ABS: 0.579 THOU/uL (ref 0.1–0.9)
MPV: 6.8 FL — ABNORMAL LOW (ref 7.4–10.4)
PLATELET COUNT: 480 THO/UL — ABNORMAL HIGH (ref 140–450)
PMN ABS: 3.87 THOU/uL (ref 1.3–7.7)
PMN'S: 52 % (ref 40–75)
RBC: 4.59 MIL/uL (ref 3.84–5.04)
RDW: 12.6 % (ref 11.5–14.5)
WBC: 7.5 THOU/UL (ref 3.5–11.0)

## 2008-02-19 ENCOUNTER — Ambulatory Visit (INDEPENDENT_AMBULATORY_CARE_PROVIDER_SITE_OTHER): Payer: MEDICAID | Admitting: Family Medicine

## 2008-02-19 VITALS — BP 134/84 | Temp 98.2°F | Wt 194.9 lb

## 2008-02-19 MED ORDER — CEPHALEXIN 500 MG CAPSULE
500.00 mg | ORAL_CAPSULE | Freq: Four times a day (QID) | ORAL | Status: DC
Start: 2008-02-19 — End: 2008-10-10

## 2008-02-19 NOTE — Progress Notes (Signed)
 SUBJECTIVE: 52 year old woman complaining of right leg pain and discomfort. She was seen and evaluated in urgent care thought to have cellulitis given Keflex  for 10 days she is here in followup. She continues to have discomfort in the  lateral aspect of the right leg. She denies any pain in the calf there been no changes in bowel or bladder habits she's had no GYN complaints. There've been no fever chills or sweats. The erythema that initially was present is mostly resolved. She has some associated sharp pain in the anterior shin. Her past medical history is significant for recent rehabilitation and released from physical therapy for left leg fracture. She reports she is due for her yearly examination and wishes that scheduled with Dr. Kathren.  Current outpatient prescriptions : cephALEXin  (KEFLEX ) 500 mg Cap, take 1 Cap by mouth Four times a day. , Disp: 40, Rfl: 0;  ASTELIN 137 mcg SprA, 2 Sprays by Nasal route Twice daily. Use in each nostril as directed, Disp: , Rfl: ;  NEXIUM  40 mg CpDR, take 1 Cap by mouth Once a day. , Disp: , Rfl: ;  EFFEXOR XR 75 mg Cp24, 3 capsules, oral, daily, Disp: 3, Rfl: 2;  ADVIL  200 mg Tab, 3, daily, Disp: 3, Rfl:   Allergies   Allergen Reactions   . Codeine       RASH   . Strawberry        OBJECTIVE: Blood pressure 138/84 she is afebrile alert and oriented. Heart and lungs normal. Examination of the lower extremities reveals no obvious edema there is mild to faint erythema lateral aspect of the right leg from the knee down towards the mid calf in the anterior lateral distribution. She is superficially tender to touch in the anterior and lateral aspect of the upper lower extremity. She has good range of motion in the knee and the ankle there is no ankle edema no lower extremity pitting edema. There is no tenderness or cords in the calf. Homans sign is negative. The posterior aspect of the knee and popliteal fossa are not involved.    ASSESSMENT AND PLAN: Right lower extremity  discomfort with cellulitis. Recommend cool soaks  applied to the area couple times a day as tolerated. Will extend Keflex  for an additional week recommending 500 mg p.o. q.i.d. she can take that in divided dose to twice a day if tolerated.    Health maintenance issues I have ordered a mammogram for screening purposes as that is due and have scheduled her with Dr. Kathren in her next available appointment for physical examination per the patient's instructions.

## 2008-02-28 ENCOUNTER — Ambulatory Visit (HOSPITAL_COMMUNITY): Payer: MEDICAID

## 2008-03-06 ENCOUNTER — Ambulatory Visit
Admission: RE | Admit: 2008-03-06 | Discharge: 2008-03-06 | Disposition: A | Discharge status: Home / Self Care | Payer: MEDICAID | Attending: Family Medicine | Admitting: Family Medicine

## 2008-03-06 DIAGNOSIS — Z1231 Encounter for screening mammogram for malignant neoplasm of breast: Secondary | ICD-10-CM | POA: Insufficient documentation

## 2008-03-07 ENCOUNTER — Other Ambulatory Visit (INDEPENDENT_AMBULATORY_CARE_PROVIDER_SITE_OTHER): Payer: Self-pay | Admitting: Family Medicine

## 2008-03-08 ENCOUNTER — Ambulatory Visit (INDEPENDENT_AMBULATORY_CARE_PROVIDER_SITE_OTHER): Payer: MEDICAID | Admitting: GENERAL

## 2008-03-11 ENCOUNTER — Ambulatory Visit
Admission: RE | Admit: 2008-03-11 | Discharge: 2008-03-11 | Disposition: A | Discharge status: Home / Self Care | Payer: MEDICAID | Attending: Family Medicine | Admitting: Family Medicine

## 2008-03-11 DIAGNOSIS — R928 Other abnormal and inconclusive findings on diagnostic imaging of breast: Secondary | ICD-10-CM | POA: Insufficient documentation

## 2008-03-11 NOTE — Progress Notes (Signed)
[facility] Department of Medicine  [address]  [place], [place] [address]      GI SOAP NOTE    PATIENT NAME: [name], [name]  CHART NUMBER: [id number]  DATE OF BIRTH: 1956/01/02  DATE OF SERVICE: 03/08/2008    PROBLEM LIST:  1.  GERD.  Her last EGD with biopsy was on 12/19/07 and showed erythema in the gastric antrum and a patulous GE junction zone. Her last 24-hour pH probe was 01/03/08, and her last manometry was 01/03/08.  2.  Globus sensation. The patient does have an enlarged thyroid gland.  3.  Dysphagia.    SUBJECTIVE: Since the patient's procedures and last clinic appointment, she states that she has done much better on twice daily Nexium.  With twice daily Nexium, she is only getting breakthrough symptoms 2-3 times per week, which are relieved by Tums. The dysphagia has also improved, although she still has dysphagia to large pieces of food. She states that she will be seeing ENT 05/27/08 for the thyroid enlargement. The patient denies any nausea, vomiting, weight loss, fevers, or chills. She denies any abdominal pain aside from "sour stomach" on occasion. The patient is currently taking Keflex for a skin infection on her thigh, but denies any diarrhea, constipation, melena, or bright red blood per rectum. The patient states that she has some throat pain from her thyroid enlargement. She has occasional shortness of breath with mild exertion.  She denies any chest pain. The patient presents alone today for followup.    REVIEW OF SYSTEMS: Review of systems is as above. All other systems noncontributory.    Past medical history, past surgical history, family history, and social history were reviewed and unchanged from her H&P from 11/17/07.    ALLERGIES: Codeine causes a rash.    MEDICATIONS:    1.  Keflex 2000 mg p.o. b.i.d.   2.  Effexor 225 mg p.o. daily.  3.  Nexium 40 mg p.o. b.i.d.   4.  Astelin 2 sprays per nostril as needed.   5.  Advil p.r.n.    She denies use of any other  over-the-counter medications or herbal medications.    OBJECTIVE:  Vital signs:  Height 5 feet 6 inches, weight 193.6 pounds (her last weight was 194.6 pounds on 11/17/07), temperature 37.5 degrees Centigrade, pulse 76, respiratory rate 20, and blood pressure 130/90.  General:  In no acute distress.  HEENT:  PERRLA, no scleral icterus.  Mucous membranes are moist and intact.  Chest:  Clear to auscultation bilaterally posteriorly.  Cardiovascular:  Regular rate and rhythm, no murmurs, rubs, or gallops.  Abdomen:  Soft, nontender, nondistended.  Bowel sounds are present.  Extremities:  2+ bilateral radial pulses, trace bilateral pretibial edema.  Skin:  No lesions.  She has a faint resolving rash at her right thigh at the location of her recent skin infection.    ASSESSMENT:  This is a 52 year old female with acid reflux, a globus sensation, and dysphagia who is doing well GI wise. Her symptoms have markedly improved on twice daily Nexium; however, the patient continues to have globus sensation and dysphagia, which may be due to her enlarged thyroid gland. She has followup with ENT on 05/27/08. The patient is due for a screening colonoscopy.    PLAN:  1.  The patient is due for a screening colonoscopy, but would like to wait until after her thyroid issues are addressed.  The patient's PCP can call our office so that she  can be directly scheduled for a screening colonoscopy after her thyroid problems are addressed.  2.  The patient will continue twice daily Nexium.  3.  The patient will continue reflux guidelines.  4.  The patient's followup with our clinic will be on a p.r.n. basis.    The patient was seen and examined by Dr. Festus Aloe in clinic today, and he agrees with the above evaluation and recommendations.      Donnajean Lopes, MD  Fellow, Section of Gastroenterology  [facility] Department of Medicine    I saw and examined this patient and agree with the impression and plan as outlined above.    Kyra Manges, MD  Assistant  Professor; Section of Digestive Diseases  [facility]    [id number]; D: 03/08/2008 10:18:31; T: 03/08/2008 12:46:19    cc: Theola Sequin MD      Alda Ponder MD      Shirleen Schirmer

## 2008-03-29 ENCOUNTER — Ambulatory Visit (INDEPENDENT_AMBULATORY_CARE_PROVIDER_SITE_OTHER): Payer: MEDICAID | Admitting: Family

## 2008-03-29 ENCOUNTER — Ambulatory Visit
Admission: RE | Admit: 2008-03-29 | Discharge: 2008-03-29 | Disposition: A | Discharge status: Home / Self Care | Payer: MEDICAID | Attending: Family | Admitting: Family

## 2008-03-29 ENCOUNTER — Ambulatory Visit (INDEPENDENT_AMBULATORY_CARE_PROVIDER_SITE_OTHER)
Admission: RE | Admit: 2008-03-29 | Discharge: 2008-03-29 | Disposition: A | Discharge status: Home / Self Care | Payer: MEDICAID | Source: Ambulatory Visit | Admitting: Rheumatology

## 2008-03-29 VITALS — BP 118/78 | HR 78 | Temp 97.1°F | Wt 195.0 lb

## 2008-03-29 DIAGNOSIS — M7989 Other specified soft tissue disorders: Secondary | ICD-10-CM | POA: Insufficient documentation

## 2008-03-29 DIAGNOSIS — M25569 Pain in unspecified knee: Secondary | ICD-10-CM | POA: Insufficient documentation

## 2008-03-29 LAB — CBC/DIFF
BASOPHILS: 0 % (ref 0–1)
BASOS ABS: 0.024 THOU/uL (ref 0.0–0.2)
EOS ABS: 0.013 10*3/uL — ABNORMAL LOW (ref 0.1–0.3)
EOSINOPHIL: 0 % — ABNORMAL LOW (ref 1–6)
HCT: 43.6 % (ref 33.5–45.2)
HGB: 13.9 g/dL (ref 11.2–15.2)
LYMPHOCYTES: 36 % (ref 20–45)
LYMPHS ABS: 2.54 THOU/uL (ref 1.0–4.8)
MCH: 27.9 pg (ref 27.4–33.0)
MCHC: 31.9 g/dL (ref 31.6–35.5)
MCV: 87.4 fL (ref 78–100)
MONOCYTES: 6 % (ref 4–13)
MONOS ABS: 0.454 10*3/uL (ref 0.1–0.9)
MPV: 6.4 FL — ABNORMAL LOW (ref 7.4–10.4)
PLATELET COUNT: 437 THO/UL (ref 140–450)
PMN ABS: 4.12 THOU/uL (ref 1.3–7.7)
PMN'S: 58 % (ref 40–75)
RBC: 4.98 MIL/uL (ref 3.84–5.04)
RDW: 13.5 % (ref 11.5–14.5)
WBC: 7.2 THOU/UL (ref 3.5–11.0)

## 2008-03-29 LAB — CREATININE WITH EGFR: ESTIMATED GLOMERULAR FILTRATION RATE: >59 ml/min/1.73m2 (ref >59)

## 2008-03-29 LAB — ELECTROLYTES: CARBON DIOXIDE: 26 mmol/L (ref 22–32)

## 2008-03-29 LAB — THYROID STIMULATING HORMONE WITH FREE T4 REFLEX: THYROID STIMULATING HORMONE WITH FREE T4 REFLEX: 1.139 u[IU]/mL (ref 0.300–5.900)

## 2008-03-29 NOTE — Progress Notes (Signed)
SUBJECTIVE:  [name] is a 52 y.o. female who with very severe knee pain for the past month. She has been seen in family med clinic and also at [facility] urgent care. Mechanism of injury: unknown.Symptoms: Currently, she has severe pain, tenderness, swelling that radiates up tohip and down to foot. She is having trouble ambulating on the leg and is actively limpina.  Symptoms have been worsening since that time. Prior history of related problems: no prior problems with this area in the past.    OBJECTIVE:  Vital signs as noted above.  Appearance: alert, oriented times 3, afebrile and normal vitals.  Knee exam: antalgic gait, soft tissue tenderness over popliteal space, reduced range of motion, exam limited by acuity of pain.  X-ray: pending MRI and Duplex. Had prior negative Duplex at [facility] 3 weeks or so ago.     ASSESSMENT:  Knee Pain and Swelling    PLAN:    See orders in EpicCare

## 2008-04-02 NOTE — Procedures (Signed)
[facility]   PERIPHERAL VASCULAR LABORATORY   [facility] ACCREDITED   BLOOD FLOW REPORT   Centralized Scheduling [phone]   STATUS: [place]    NAME: [name], [name]   GLOV#:[id number]   DATE: 03/29/2008  DOB : 28-Mar-1956 SEX:F     Tech: [facility]    PROCEDURE: Right lower extremity venous duplex.     REFERRING DIAGNOSIS: Swelling.     REPORT     RIGHT COMMON FEMORAL VEIN  Compressibility: Good.  Phasic: Yes.  Spontaneous: Yes.  Augmentation: Good.  Filling Defect: No.  Reflux: No.     RIGHT SUPERFICIAL FEMORAL VEIN  Compressibility: Good.  Phasic: Yes.  Spontaneous: Yes.  Augmentation: Good.  Filling Defect: No.  Reflux: No.     RIGHT POPLITEAL VEIN  Compressibility: Good.  Phasic: Yes.  Spontaneous: Yes.  Augmentation: Good.  Filling Defect: No.  Reflux: No.     PERONEAL: Normal.     GREATER SAPHENOUS VEIN: Normal.     POSTERIOR TIBIAL VEIN: Normal.     LEFT COMMON FEMORAL VEIN  Compressibility: Good.  Phasic: Yes.  Spontaneous: Yes.  Augmentation: Good.  Filling defect: No.  Reflux: No.     TECHNICIAN'S COMMENTS: Right lower extremity appeared to be within normal limits.     CONCLUSION: No evidence of right lower extremity DVT or venous insufficiency within the limits of the examination.      Bennie Pierini, MD  Associate Professor  Division of Vascular and Endovascular Services  [facility]    [id number]; D: 04/02/2008 01:09:32; T: 04/02/2008 14:17:54

## 2008-04-04 ENCOUNTER — Ambulatory Visit
Admission: RE | Admit: 2008-04-04 | Discharge: 2008-04-04 | Disposition: A | Discharge status: Home / Self Care | Payer: MEDICAID | Attending: Family | Admitting: Family

## 2008-04-05 ENCOUNTER — Encounter (HOSPITAL_COMMUNITY): Payer: MEDICAID

## 2008-04-10 ENCOUNTER — Ambulatory Visit (INDEPENDENT_AMBULATORY_CARE_PROVIDER_SITE_OTHER): Payer: MEDICAID | Admitting: Family

## 2008-04-10 VITALS — BP 128/70 | HR 80 | Temp 97.1°F | Wt 194.0 lb

## 2008-04-10 NOTE — Progress Notes (Signed)
SUBJECTIVE:  [name] is a 52 y.o. female who complains of right knee pain that is ongoing. No known Mechanism of injury. Symptoms:Pain and ongoing pain with radiation to groin and ankle with some ankle swelling. She is sleeping with her legs elevated at night. Symptoms have been unchanged since that time. Prior history of related problems: no prior problems with this area in the past.    I have reviewed the patient's medical history in detail and updated the computerized patient record.    OBJECTIVE:  Vital signs as noted above.  Appearance: in no apparent distress, well developed and well nourished, non-toxic, in no respiratory distress and acyanotic, alert and oriented times 3.  Knee exam: antalgic gait, effusion.  X-ray: no fracture or dislocation noted, soft tissue swelling.    ASSESSMENT:  Knee strain    PLAN:  rest the injured area as much as practical, apply ice packs, referral to Dr. Townsend Roger for this pain  Continue with Tylenol Arthritis.  See orders in [facility]

## 2008-05-03 ENCOUNTER — Ambulatory Visit (INDEPENDENT_AMBULATORY_CARE_PROVIDER_SITE_OTHER): Payer: MEDICAID | Admitting: Family Medicine

## 2008-05-03 ENCOUNTER — Ambulatory Visit
Admission: RE | Admit: 2008-05-03 | Discharge: 2008-05-03 | Disposition: A | Discharge status: Home / Self Care | Payer: MEDICAID | Attending: Family Medicine | Admitting: Family Medicine

## 2008-05-03 VITALS — BP 119/78 | Wt 199.0 lb

## 2008-05-03 DIAGNOSIS — M25569 Pain in unspecified knee: Secondary | ICD-10-CM | POA: Insufficient documentation

## 2008-05-03 MED ORDER — DICLOFENAC SODIUM 75 MG TABLET,DELAYED RELEASE
75.00 mg | DELAYED_RELEASE_TABLET | Freq: Two times a day (BID) | ORAL | Status: DC
Start: 2008-05-03 — End: 2008-12-23

## 2008-05-03 NOTE — Progress Notes (Signed)
Sports Medicine at [facility]  Department of [facility] Medicine  [employer]    Progress Note  Subjective:  [name] is a 52 y.o. female is a new patient to the Sports Medicine Center. She is here for evaluation of her knee pain.  I am seeing this patient at the request of Dr. Jacqualyn Posey. Dr. Alda Lea  is consulting me regarding the causes of and treatment options available for the patient's knee pain.    The pain is localized to the anterior aspect of the joint. Rated as 6 out of 10. Worsened with night time pain, the worst. Associated symptoms include: mild swelling. Less swelling over the last few weeks. Pain has been present for a couple of months. There has not been antecedent trauma. Seen by [facility] ? Infection near the knee started keflex x 4 weeks in May.    An MRI of the right knee was performed 6/509 and revealed no abnormalities. I do not see recent plain films.    PMHx: GERD, headaches, depression.  SocHx: divorced.     Objective:  General WNWD in NAD. BP 119/78   Wt 90.266 kg (199 lb)  Bilateral knees were examined. ROM = full. There is tenderness to palpation of the peripatellar structures. Palpation of the joint lines reveal tenderness medially. There is a mild effusion on exam today. The ligaments exams are negative. The meniscus exams are negative in the office today.   There is a normal neurovascular assessment distally.   There are no overlying skin erythema, ecchymosis, rashes or lesions.       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 PFA.     Plan:  1. Ice 15 minutes QID and prn.  2. I have written a prescription for physical therapy to improve the patient's symptoms and function. This is the primary treatment option for this patient at this time.  3. NSAID's: I recommend diclofenac 75 mg PO BID with food for at least a week. The patient is to avoid other NSAID's concurrently.  4. Plain xrays have been ordered and the results are still pending.   5. Follow up in 6 weeks, sooner if worsening signs or symptoms.       Estrella Deeds, MD  Associate Professor  Director, Division of Sports Medicine  Director, Primary Care Sports Medicine Fellowship  Dept of Family Medicine  [facility]

## 2008-05-16 ENCOUNTER — Encounter (INDEPENDENT_AMBULATORY_CARE_PROVIDER_SITE_OTHER): Payer: MEDICAID | Admitting: Family Medicine

## 2008-05-17 ENCOUNTER — Ambulatory Visit (HOSPITAL_COMMUNITY): Payer: Self-pay | Admitting: Family Medicine

## 2008-05-17 NOTE — Telephone Encounter (Signed)
 Triage Queue message copied by RUTHELLEN RIGGS on Fri May 17, 2008 10:51 AM  ------   Message from: LOLA ROSELLA   Created: Fri May 17, 2008 8:41 AM    >> LOLA ROSELLA Fri May 17, 2008 8:41 am  Darice CHRISTELLA Coats, MD    Patient missed her physicial on 05/17/08, and she had it written down as 05/20/08. I tried to schedule her but you have no openings until 11/09. She asked if you would special her in?

## 2008-05-20 NOTE — Telephone Encounter (Signed)
Left msg asking patient to call for appt.

## 2008-05-27 ENCOUNTER — Encounter (INDEPENDENT_AMBULATORY_CARE_PROVIDER_SITE_OTHER): Payer: MEDICAID | Admitting: Otolaryngology

## 2008-06-07 ENCOUNTER — Other Ambulatory Visit (INDEPENDENT_AMBULATORY_CARE_PROVIDER_SITE_OTHER): Payer: MEDICAID | Admitting: Family Medicine

## 2008-06-10 ENCOUNTER — Other Ambulatory Visit (INDEPENDENT_AMBULATORY_CARE_PROVIDER_SITE_OTHER): Payer: MEDICAID | Admitting: Family Medicine

## 2008-06-14 ENCOUNTER — Encounter (INDEPENDENT_AMBULATORY_CARE_PROVIDER_SITE_OTHER): Payer: MEDICAID | Admitting: Otolaryngology

## 2008-06-24 ENCOUNTER — Ambulatory Visit (INDEPENDENT_AMBULATORY_CARE_PROVIDER_SITE_OTHER): Payer: MEDICAID | Admitting: Family Medicine

## 2008-06-25 NOTE — Progress Notes (Signed)
Sports Medicine at [employer] Family Medicine  Department of [facility] Medicine  [employer]    Progress Note  Subjective:  [name] is a 52 y.o. 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 I have previously recommended ice, diclofenac and PT.    The patient states she is better. The patient rates the pain as a 2 out of 10, localized to the anterior aspect of the joint. The pain is worse with squats. There is no redness, bruising or swelling. The patient denies paresis or paresthesias. The patient has been to 8 sessions of physical therapy at [facility]. The latest PT note from Dominica Severin has been reviewed dated 06/07/08.     Other 10-point ROS: negative.  PMHx: GERD, headaches, depression.   SocHx: divorced.  Nonsmoker.      Objective:  General WNWD in NAD. BP 126/82   Wt 90.719 kg (200 lb)    Bilateral knees were examined. ROM = full. There is tenderness to palpation of the peripatellar structures. Palpation of the joint lines reveal tenderness medially. There is a mild effusion on exam today. The ligaments exams are negative. The meniscus exams are negative in the office today.   There is a normal neurovascular assessment distally.   There are no overlying skin erythema, ecchymosis, rashes or lesions.       Assessment:  1. OA knees MRI negative   2. PFA.     Plan:  1. Ice 15 minutes QID and prn.  2. Continue home exercise therapy to improve the patient's symptoms and function. This is the primary treatment option for this patient at this time.  3. NSAID's: Diclofenac 75mg  PO BID with food for at least a week. The patient is to avoid other NSAID's concurrently.  4. Brace as instructed as needed.   6. Follow up in 4 months, sooner if worsening signs or symptoms.       Estrella Deeds, MD  Associate Professor  Director, Division of Sports Medicine  Director, Primary Care Sports Medicine Fellowship  Dept of Family Medicine  [facility]

## 2008-07-05 ENCOUNTER — Encounter (INDEPENDENT_AMBULATORY_CARE_PROVIDER_SITE_OTHER): Payer: MEDICAID | Admitting: Psychiatry

## 2008-07-17 ENCOUNTER — Encounter (INDEPENDENT_AMBULATORY_CARE_PROVIDER_SITE_OTHER): Payer: MEDICAID | Admitting: Otolaryngology

## 2008-07-19 ENCOUNTER — Encounter (INDEPENDENT_AMBULATORY_CARE_PROVIDER_SITE_OTHER): Payer: MEDICAID | Admitting: Otolaryngology

## 2008-07-23 ENCOUNTER — Ambulatory Visit (INDEPENDENT_AMBULATORY_CARE_PROVIDER_SITE_OTHER): Payer: Self-pay | Admitting: Family Medicine

## 2008-07-23 NOTE — Telephone Encounter (Signed)
[facility] told patient to call you to put order in for f/u diagnostic mammogram.  Needs them done every six months.  Earlier mammogram showed a "spot".  Has to be scheduled after 09/11/08.

## 2008-07-23 NOTE — Telephone Encounter (Signed)
Patient received a letter from [facility] asking her to call you to put in an order for six month f/u mammogram.   Says she has to go every six months because there was a "spot" on an earlier mammogram.

## 2008-07-24 NOTE — Telephone Encounter (Addendum)
Addended by: Jerry Caras on: 07/24/2008 2:15:02 PM     Modules accepted: Orders

## 2008-07-26 ENCOUNTER — Ambulatory Visit (INDEPENDENT_AMBULATORY_CARE_PROVIDER_SITE_OTHER): Payer: MEDICAID | Admitting: Psychiatry

## 2008-07-26 MED ORDER — DULOXETINE 60 MG CAPSULE,DELAYED RELEASE
60.0000 mg | DELAYED_RELEASE_CAPSULE | Freq: Every day | ORAL | Status: DC
Start: 2008-07-26 — End: 2008-08-23

## 2008-08-06 ENCOUNTER — Ambulatory Visit (HOSPITAL_COMMUNITY): Payer: Self-pay | Admitting: Psychiatry

## 2008-08-06 NOTE — Telephone Encounter (Signed)
Triage Queue message copied by Marletta Lor on Tue Aug 06, 2008 4:12 PM  ------   Message from: Baldwin Crown   Created: Tue Aug 06, 2008 12:17 PM    >> Baldwin Crown Tue Aug 06, 2008 12:17 pm  Dr Leretha Pol- pt stated that she has last seen you on 10/#2/9 and you were to call in a script of Cymbalta 60mg  takes 1 [facility] to [facility] [place]- [phone]. She said that according to the pharmacy, they never received that. She is almost out of the samples and is asking that you call in the refill. Thanks

## 2008-08-06 NOTE — Telephone Encounter (Signed)
I called in the prescription for cymbalta 60 mg daily, no refill.

## 2008-08-21 ENCOUNTER — Ambulatory Visit (INDEPENDENT_AMBULATORY_CARE_PROVIDER_SITE_OTHER): Payer: MEDICAID | Admitting: Otolaryngology

## 2008-08-22 ENCOUNTER — Other Ambulatory Visit (INDEPENDENT_AMBULATORY_CARE_PROVIDER_SITE_OTHER): Payer: Self-pay | Admitting: Otolaryngology

## 2008-08-23 ENCOUNTER — Ambulatory Visit (INDEPENDENT_AMBULATORY_CARE_PROVIDER_SITE_OTHER): Payer: MEDICAID | Admitting: Psychiatry

## 2008-08-23 MED ORDER — DULOXETINE 60 MG CAPSULE,DELAYED RELEASE
60.0000 mg | DELAYED_RELEASE_CAPSULE | Freq: Every day | ORAL | Status: DC
Start: 2008-08-23 — End: 2008-11-19

## 2008-08-29 NOTE — Progress Notes (Signed)
I saw and evaluated the patient. I reviewed the resident's note. I agree with the findings and plan of care as documented in the resident's note. Any exceptions/additions are noted.

## 2008-09-04 ENCOUNTER — Ambulatory Visit
Admission: RE | Admit: 2008-09-04 | Discharge: 2008-09-04 | Disposition: A | Discharge status: Home / Self Care | Payer: MEDICAID | Attending: Family Medicine | Admitting: Family Medicine

## 2008-09-08 ENCOUNTER — Other Ambulatory Visit (INDEPENDENT_AMBULATORY_CARE_PROVIDER_SITE_OTHER): Payer: Self-pay | Admitting: Family Medicine

## 2008-09-09 NOTE — Progress Notes (Signed)
 [facility] Department of Otolaryngology  [address]  [place], [place] [address]      PROGRESS NOTE    PATIENT NAME: [name], [name]  CHART NUMBER: [id number]  DATE OF BIRTH: January 25, 1956  DATE OF SERVICE: 08/21/2008    SUBJECTIVE: Ms. Donelan is a 52 year old woman with several head and neck problems and complaints that we have been following. First, she has a history of allergic rhinitis and allergic rhinitis. This is controlled pretty well with nasal saline and steroid sprays. In addition, she has had some laryngopharyngeal reflux and takes Nexium  for this. She states this has also been stable. Lastly, she has had a multinodular goiter, which we have been following with serial ultrasounds. She underwent an ultrasound of her last visit in February 2009. This showed decrease in size of the largest nodule, which measured approximately 1.5 cm in dimension down to approximately 8 mm in a more recent dimension. For that reason, FNA was performed and was benign appearing as in the past, so this was not repeated on the recent thyroid  ultrasound. She has not had any swallowing difficulties or any breathing disturbances. She states her weight has been stable.    OBJECTIVE: This is a healthy appearing middle-aged lady in no distress. She is 5 feet 6 inches tall, weighs 201 pounds. Temperature is 97.6. HEENT is normocephalic, atraumatic. Ears clear. TMs pearly and mobile. No evidence of effusion. Nose is fairly straight on frontal view. Internally, the septum is slightly convoluted and deviated toward the left side anteriorly, though overall pretty straight. She has pale mucosa, some sticky, clear secretions bilaterally. No purulence or crusting. No polyps or masses. Oral cavity and oropharynx is clear. IDL: Sensitive gag reflex. I was able to get a brief look at the posterior larynx with the mirror. Posterior glottis shows some slight erythema and edema, consistent with reflux. Both cords are pearly and  approximate in the midline. I could not examine the anterior larynx well with the mirror on today's exam. Neck: No thyromegaly, no obvious nodules. No other masses or adenopathy.    ASSESSMENT:   1. Allergic rhinitis, stable.  2. Laryngopharyngeal reflux controlled with Nexium , has been stable.  3. Multinodular goiter.    PLAN: We will set her up for repeat thyroid  ultrasound in six months and return thereafter.      Oneil DELENA Prom, MD  Assistant Professor  [facility] Department of Otolaryngology    [id number]; D: 08/21/2008 18:50:38; T: 08/22/2008 14:01:00

## 2008-09-12 ENCOUNTER — Ambulatory Visit (INDEPENDENT_AMBULATORY_CARE_PROVIDER_SITE_OTHER): Payer: MEDICAID

## 2008-09-12 ENCOUNTER — Other Ambulatory Visit (INDEPENDENT_AMBULATORY_CARE_PROVIDER_SITE_OTHER): Payer: Self-pay | Admitting: Medical

## 2008-09-24 ENCOUNTER — Encounter (INDEPENDENT_AMBULATORY_CARE_PROVIDER_SITE_OTHER): Payer: MEDICAID | Admitting: Psychiatry

## 2008-09-26 NOTE — Progress Notes (Signed)
I saw and evaluated the patient. I reviewed the resident's note. I agree with the findings and plan of care as documented in the resident's note. Any exceptions/additions are noted.

## 2008-10-10 ENCOUNTER — Encounter (INDEPENDENT_AMBULATORY_CARE_PROVIDER_SITE_OTHER): Payer: Self-pay | Admitting: Family Medicine

## 2008-10-10 ENCOUNTER — Ambulatory Visit (INDEPENDENT_AMBULATORY_CARE_PROVIDER_SITE_OTHER): Payer: MEDICAID | Admitting: Family Medicine

## 2008-10-10 VITALS — BP 138/90 | HR 84 | Temp 98.0°F | Ht 65.0 in | Wt 205.2 lb

## 2008-10-10 DIAGNOSIS — R3129 Other microscopic hematuria: Secondary | ICD-10-CM | POA: Insufficient documentation

## 2008-10-10 DIAGNOSIS — E042 Nontoxic multinodular goiter: Secondary | ICD-10-CM | POA: Insufficient documentation

## 2008-10-10 DIAGNOSIS — Z0000 Encounter for general adult medical examination without abnormal findings: Secondary | ICD-10-CM

## 2008-10-10 DIAGNOSIS — Z1211 Encounter for screening for malignant neoplasm of colon: Secondary | ICD-10-CM

## 2008-10-10 DIAGNOSIS — Z23 Encounter for immunization: Secondary | ICD-10-CM

## 2008-10-10 DIAGNOSIS — Z131 Encounter for screening for diabetes mellitus: Secondary | ICD-10-CM

## 2008-10-10 DIAGNOSIS — Z13220 Encounter for screening for lipoid disorders: Secondary | ICD-10-CM

## 2008-10-10 DIAGNOSIS — R609 Edema, unspecified: Secondary | ICD-10-CM

## 2008-10-10 DIAGNOSIS — T148XXA Other injury of unspecified body region, initial encounter: Secondary | ICD-10-CM

## 2008-10-10 DIAGNOSIS — Z1239 Encounter for other screening for malignant neoplasm of breast: Secondary | ICD-10-CM

## 2008-10-10 NOTE — Patient Instructions (Signed)
LAB TESTS  Fasting lab test(s) have been ordered for you.  Please return to the lab soon when you have fasted overnight to have lab test drawn.  Fasting means that you should have no food or drinks that contain calories for at least 10 hrs before this test.  You may have water, coffee or tea without cream or sugar (artificial sweetener is okay), or diet soft drinks during the 10 hr fast.    You don't need an appointment for lab tests. The Family Medicine lab is open 8 am to 3:30 pm every weekday.  The [facility] lab is open Monday thru Thursday, 7:30 am to 7 pm, Friday 7:30 am to 6 pm, and Saturday 9:30 am to 1:30 pm.  You may go to whichever lab is most convenient for you.

## 2008-10-10 NOTE — H&P (Signed)
SUBJECTIVE:   [name] is a 52 y.o. female  for presenting for her annual checkup.     She complains of edema in bilateral ankles that has been present for 2 months.  The onset of the edema was gradual.  No associated injury.  No prior history of liver, kidney, cardiac disease, or albuminuria.  No prior history of deep venous thrombosis, pulmonary embolism or thrombophilia.  Patient denies any new medications and specifically denies using OTC NSAIDs or aspirin.  She denies high salt intake in diet.  Patient has tried elevating on foot rest which have been somewhat effective.     Diet History:   "healthy" diet  in general  restricting intake of the following: salt    Calcium intake:adequate  Exercise History: active watching granddaughter, no structured exercise.    Last lipid profile and glucose: not checked in over a year     I have reviewed the patient's medical , family and social history in detail and updated the computerized patient record.  Current outpatient prescriptions   Medication Sig   . NEXIUM 40 mg CpDR 2-40mg  tabs daily    . duloxetine (CYMBALTA) 60 mg CpDR take 1 Cap by mouth Once a day.    . ASTELIN 137 mcg SprA 2 Sprays by Nasal route Twice daily. Use in each nostril as directed   . ADVIL 200 mg Tab 3, daily PRN (about 2 doses a week)      Tobacco History  reports that she has quit using tobacco.  Her tobacco use included cigarettes.  She has a 80 pack-year smoking history.  Immunization History   Name Date(s) Administered   . H1n1 Flu Vaccine Injection (Admin) 10/10/2008   . INFLUENZA VIRUS VACCINE(ADMIN) 09/11/2001       ROS:   Constitutional: negative for fevers, chills and weight loss  Eyes: negative for visual disturbance  Ears, nose, mouth, throat, and face: negative for hearing loss, earaches and hoarseness  Respiratory: negative for cough or dyspnea on exertion  Cardiovascular: negative for chest pressure/discomfort, palpitations, near-syncope, syncope  Gastrointestinal: negative  for dysphagia, nausea, vomiting, diarrhea, constipation, abdominal pain and blood in bowel movements  Genitourinary:negative for frequency, dysuria and hematuria.  +stress incontinence worse in last year  Menstrual history: no bleeding, no sexual concerns or vaginal irritation.  Integument/breast: negative for rash and changed mole  Hematologic/lymphatic: negative for lymphadenopathy  Musculoskeletal:negative for myalgias,  and muscle weakness, + knee pain  Neurological: negative for seizures and stroke  or TIA symptoms  Behvioral/Psych: negative for depression  Endocrine: negative for diabetic symptoms including polyuria, polydipsia and weight loss  Allergic/Immunologic: negative      OBJECTIVE:   BP 138/90  Pulse 84  Temp (Src) 36.7 C (98 F) (Oral)  Ht 1.651 m (5\' 5" )  Wt 93.078 kg (205 lb 3.2 oz)   Body mass index is 34.15 kg/(m^2).   General appearance: alert, well appearing in no distress.  PERLA.  TMs, mouth and pharynx are normal.  Neck supple without adenopathy or thyromegaly.  Lungs are clear, good air entry, no wheezes, rhonchi or rales. Heart: S1 and S2 normal, no murmurs or gallops, regular rate and rhythm.  No edema.  Abdomen soft without tenderness, guarding, mass or organomegaly.  Skin without worrisome lesions.  Reflexes are normal.  Breasts: normal without suspicious masses, skin or nipple changes or axillary nodes.  Pelvic exam: not indicated, post hysterectomy .    ASSESSMENT & PLAN:   Encounter Diagnoses  Code Name Primary? Qualifier   . V70.0H Well Woman Exam (no Gynecological Exam)   Yes    . V76.10C Breast Cancer Screening      Plan: MAMMO BILATERAL SCREENING      . V76.51E Colon Cancer Screening      Plan: ENDOSCOPY REQUEST for screening colonoscopy     . 782.3BN Pedal Edema      Plan: TSH, SENSITIVE, CREATININE, BUN, ELECTROLYTES, CBC/DIFF, POCT URINE DIPSTICK,   If negative, support hose, elevation, continue to limit Na+ intake     . V77.1D Diabetes Mellitus Screening      Plan:  GLUCOSE FASTING Return for labs soon.       . V77.91A Screening Cholesterol Level      Plan: LIPID PANEL Return for labs soon.      . 829.38M Fracture History      Comment:  concern for osteoporosis    Plan: PHOSPHORUS, CALCIUM, ALBUMIN, PTH INTACT, VITAMIN D 25 HYDROXY, DEXA BONE DENSITOMETRY     . V04.81N Influenza Vaccination      Plan: H1N1 FLU INJECTABLE VACCINE (ADMIN)given today     Return office visit in 3 months.   Encouraged to get regular exercise (60 mins more days than not) and to use Vitamin D 1000 units daily during cool weather months.  Jerry Caras, MD  [facility] FAMILY MEDICINE [facility]  FAMILY MEDICINE-[facility]  [address]  [place], [place] [address]  [phone]

## 2008-10-10 NOTE — Progress Notes (Signed)
Urine Dip Results:  Time collected: 1121  Glucose: Negative  Bilirubin: Negative  Ketones: Negative  Specific Gravity: 1.015  Blood: Trace Non Hemolyzed   pH: 6.5  Protein: Negative  Urobilinogen: Normal   Nitrite: Negative  Leukocytes: Negative    Nurse initials: rb  Ordering Physician: fitzpatrick

## 2008-10-14 ENCOUNTER — Ambulatory Visit
Admission: RE | Admit: 2008-10-14 | Discharge: 2008-10-14 | Disposition: A | Discharge status: Home / Self Care | Payer: MEDICAID | Attending: Family Medicine | Admitting: Family Medicine

## 2008-10-14 DIAGNOSIS — T148XXA Other injury of unspecified body region, initial encounter: Secondary | ICD-10-CM | POA: Insufficient documentation

## 2008-10-14 DIAGNOSIS — Z13220 Encounter for screening for lipoid disorders: Secondary | ICD-10-CM | POA: Insufficient documentation

## 2008-10-14 LAB — BUN
BUN/CREAT RATIO: 15 (ref 6–22)
BUN: 13 mg/dL (ref 6–20)

## 2008-10-14 LAB — ELECTROLYTES
ANION GAP: 8 mmol/L (ref 5–16)
CARBON DIOXIDE: 25 mmol/L (ref 22–32)
CHLORIDE: 104 mmol/L (ref 96–111)
POTASSIUM: 4.1 mmol/L (ref 3.5–5.1)
SODIUM: 137 mmol/L (ref 136–145)

## 2008-10-14 LAB — CBC/DIFF
BASOPHILS: 0 % (ref 0–1)
BASOS ABS: 0.006 10*3/uL (ref 0.0–0.2)
EOS ABS: 0.015 10*3/uL — ABNORMAL LOW (ref 0.1–0.3)
EOSINOPHIL: 0 % — ABNORMAL LOW (ref 1–6)
HCT: 41.7 % (ref 33.5–45.2)
HGB: 14 g/dL (ref 11.5–15.2)
LYMPHOCYTES: 33 % (ref 20–45)
LYMPHS ABS: 2.07 10*3/uL (ref 1.0–4.8)
MCH: 28.8 pg (ref 27.4–33.0)
MCHC: 33.7 g/dL (ref 31.6–35.5)
MCV: 85.6 fL (ref 82.0–99.0)
MONOCYTES: 8 % (ref 4–13)
MONOS ABS: 0.479 10*3/uL (ref 0.1–0.9)
MPV: 5.9 FL — ABNORMAL LOW (ref 7.4–10.4)
NRBC'S: 0 /100WBC
PLATELET COUNT: 420 10*3/uL (ref 140–450)
PMN ABS: 3.67 10*3/uL (ref 1.5–7.7)
PMN'S: 59 % (ref 40–75)
RBC: 4.87 MIL/uL (ref 3.84–5.04)
RDW: 13 % (ref 10.2–14.0)
WBC: 6.3 10*3/uL (ref 3.5–11.0)

## 2008-10-14 LAB — THYROID STIMULATING HORMONE (SENSITIVE TSH): TSH: 0.904 u[IU]/mL (ref 0.300–5.900)

## 2008-10-14 LAB — LIPID PANEL
CHOLESTEROL: 206 mg/dL — ABNORMAL HIGH (ref <200)
HDL-CHOLESTEROL: 31 mg/dL — ABNORMAL LOW (ref >39)
LDL (CALCULATED): 138 mg/dL — ABNORMAL HIGH (ref <100)
TRIGLYCERIDES: 187 mg/dL — ABNORMAL HIGH (ref <150)

## 2008-10-14 LAB — CREATININE: ESTIMATED GLOMERULAR FILTRATION RATE: >59 mL/min/{1.73_m2} (ref >59)

## 2008-10-14 LAB — ALBUMIN: ALBUMIN: 3.8 g/dL (ref 3.5–4.8)

## 2008-10-14 LAB — GLUCOSE FASTING: GLUCOSE, FASTING: 113 mg/dL — ABNORMAL HIGH (ref 70–105)

## 2008-10-14 LAB — PHOSPHORUS: PHOSPHORUS: 3.4 mg/dL (ref 2.4–4.7)

## 2008-10-14 LAB — PARATHYROID HORMONE (PTH): INTACT PTH: 51.3 pg/mL (ref 12–88)

## 2008-10-14 LAB — CALCIUM: CALCIUM: 9.6 mg/dL (ref 8.5–10.4)

## 2008-10-15 LAB — VITAMIN D 25 HYDROXY: 25-HYDROXY VITAMIN D: 17 — ABNORMAL LOW

## 2008-10-22 ENCOUNTER — Encounter (INDEPENDENT_AMBULATORY_CARE_PROVIDER_SITE_OTHER): Payer: Self-pay | Admitting: Family Medicine

## 2008-11-04 ENCOUNTER — Other Ambulatory Visit (INDEPENDENT_AMBULATORY_CARE_PROVIDER_SITE_OTHER): Payer: MEDICAID | Admitting: Family Medicine

## 2008-11-19 ENCOUNTER — Ambulatory Visit (INDEPENDENT_AMBULATORY_CARE_PROVIDER_SITE_OTHER): Payer: MEDICAID | Admitting: Psychiatry

## 2008-11-19 MED ORDER — DULOXETINE 60 MG CAPSULE,DELAYED RELEASE
60.0000 mg | DELAYED_RELEASE_CAPSULE | Freq: Every day | ORAL | Status: DC
Start: 2008-11-19 — End: 2009-02-18

## 2008-12-23 ENCOUNTER — Ambulatory Visit
Admission: RE | Admit: 2008-12-23 | Discharge: 2008-12-23 | Disposition: A | Discharge status: Home / Self Care | Payer: MEDICAID | Attending: Otolaryngology | Admitting: Otolaryngology

## 2008-12-23 ENCOUNTER — Ambulatory Visit (INDEPENDENT_AMBULATORY_CARE_PROVIDER_SITE_OTHER): Payer: MEDICAID | Admitting: Otolaryngology

## 2008-12-23 NOTE — Progress Notes (Addendum)
PATIENT NAME:  [name]  MRN:  [id number]  DOB:  30-Jun-1956  DATE OF SERVICE:  12/23/2008      Chief Complaint: Follow up from 08/21/08 for Allergic Rhinits, Laryngopharyngeal Reflux, and Multinodular Goiter.    Subjective: [name] is a 53 y.o. female here in follow up as listed above.  For AR, she uses nasal saline and Astelin 2 sprays bilaterally once daily.  No issues with rhinorrhea, nasal congestion, or postnasal drainage.  She takes Nexium 40 mg twice daily for LPR, but does not always take it prior to meals.  She takes TUMS intermittently for burning in the throat.  She no longer gets hoarse.  No thyroid enlargement that she reports. Dr. Luan Pulling is following her blood work.    Objective:  BP 128/91   Pulse 104   Temp (Src) 36.6 C (97.9 F) (Tympanic)   Ht 1.651 m (5\' 5" )   Wt 92.534 kg (204 lb)   LMP Hysterectomy  General Appearance: Pleasant, cooperative, no distress.  Respiratory: No respiratory distress, no stridor.  Head: Atraumatic  Face: Symmetric  Skin: Skin warm and dry and No rashes  Ears: Tympanic membranes healthy bilaterally.  Nose: Mucosa moist and pink without lesions.  Oral Cavity: No lesions of the lips tongue and gingiva. She has upper dentures.  Oropharynx: No significant drainage.  Indirect Mirror Laryngoscopy: After spraying with topical anesthetic for her strong gag reflex, indirect mirror laryngoscopy reveals mild erythema with mobile vocal cords bilaterally.  Neck: No lymphadenopathy. No specific thyroid lesions or concerning thyromegaly.    Assessment: 53 year old female with the following:  1. Allergic rhinitis, stable on nasal saline and Astelin.  2. Laryngopharyngeal reflux, currently on Nexium 40 mg twice daily but not taking prior to meals all the time.  3. Multinodular goiter, a repeat thyroid ultrasound from today is still pending.    Plan:   We will continue the nasal saline and Astelin. We will have her take the Nexium prior to large meals. We will await the official read the thyroid ultrasound and call if there are any concerning findings.  She will F/U in 6 months with repeat ultrasound 2 weeks prior to appointment.    Danny Lawless. Arlyss Repress, MD  Resident  [facility] Department of Otolaryngology    I have seen and evaluated the patient.  I have reviewed the resident's note and agree with plans.  Any exceptions or additions are listed below.     Reece Levy  Assistant Professor  [facility] Department of Otolaryngology

## 2008-12-31 ENCOUNTER — Encounter (HOSPITAL_COMMUNITY): Payer: MEDICAID | Admitting: Psychiatry

## 2009-01-01 ENCOUNTER — Encounter (HOSPITAL_COMMUNITY): Payer: Self-pay

## 2009-01-06 ENCOUNTER — Other Ambulatory Visit (HOSPITAL_COMMUNITY): Payer: Self-pay | Admitting: Surgery

## 2009-01-06 ENCOUNTER — Encounter (HOSPITAL_BASED_OUTPATIENT_CLINIC_OR_DEPARTMENT_OTHER): Payer: MEDICAID | Admitting: Surgery

## 2009-01-06 ENCOUNTER — Encounter (HOSPITAL_COMMUNITY)
Admission: RE | Disposition: A | Discharge status: Home / Self Care | Payer: Self-pay | Source: Ambulatory Visit | Attending: Surgery

## 2009-01-06 ENCOUNTER — Encounter (HOSPITAL_COMMUNITY): Payer: Self-pay

## 2009-01-06 ENCOUNTER — Inpatient Hospital Stay
Admission: RE | Admit: 2009-01-06 | Discharge: 2009-01-06 | Disposition: A | Discharge status: Home / Self Care | Payer: MEDICAID | Attending: Surgery | Admitting: Surgery

## 2009-01-06 DIAGNOSIS — R109 Unspecified abdominal pain: Secondary | ICD-10-CM | POA: Insufficient documentation

## 2009-01-06 DIAGNOSIS — K5730 Diverticulosis of large intestine without perforation or abscess without bleeding: Secondary | ICD-10-CM | POA: Insufficient documentation

## 2009-01-06 DIAGNOSIS — Z1211 Encounter for screening for malignant neoplasm of colon: Secondary | ICD-10-CM | POA: Insufficient documentation

## 2009-01-06 HISTORY — PX: COLONOSCOPY: WVUENDOPRO10

## 2009-01-06 SURGERY — COLONOSCOPY
Anesthesia: IV Sedation

## 2009-01-06 MED ORDER — MIDAZOLAM 1 MG/ML INJECTION SOLUTION
INTRAMUSCULAR | Status: AC
Start: 2009-01-06 — End: 2009-01-06
  Filled 2009-01-06: qty 10

## 2009-01-06 MED ORDER — SODIUM CHLORIDE 0.9 % INTRAVENOUS SOLUTION
INTRAVENOUS | Status: DC
Start: 2009-01-06 — End: 2009-01-07
  Administered 2009-01-06: 0 via INTRAVENOUS

## 2009-01-06 MED ORDER — FENTANYL (PF) 50 MCG/ML INJECTION SOLUTION
25.00 ug | INTRAMUSCULAR | Status: DC | PRN
Start: 2009-01-06 — End: 2009-01-06
  Administered 2009-01-06: 25 ug via INTRAVENOUS
  Administered 2009-01-06 (×2): 50 ug via INTRAVENOUS
  Administered 2009-01-06 (×3): 25 ug via INTRAVENOUS

## 2009-01-06 MED ORDER — FENTANYL (PF) 50 MCG/ML INJECTION SOLUTION
INTRAMUSCULAR | Status: AC
Start: 2009-01-06 — End: 2009-01-06
  Filled 2009-01-06: qty 4

## 2009-01-06 MED ORDER — OXYCODONE-ACETAMINOPHEN 5 MG-325 MG TABLET
ORAL_TABLET | ORAL | Status: AC
Start: 2009-01-06 — End: 2009-01-06
  Filled 2009-01-06: qty 1

## 2009-01-06 MED ORDER — OXYCODONE-ACETAMINOPHEN 5 MG-325 MG TABLET
1.0000 | ORAL_TABLET | Freq: Once | ORAL | Status: AC
Start: 2009-01-06 — End: 2009-01-06
  Administered 2009-01-06: 1 via ORAL

## 2009-01-06 MED ORDER — MIDAZOLAM 1 MG/ML INJECTION SOLUTION
1.00 mg | INTRAMUSCULAR | Status: DC | PRN
Start: 2009-01-06 — End: 2009-01-06
  Administered 2009-01-06 (×7): 1 mg via INTRAVENOUS

## 2009-01-06 SURGICAL SUPPLY — 38 items
BRUSH CLEANING CS6021T PK/10 TRI-BRISTLE DISP (CLEN) ×1 IMPLANT
CANNULA INJ 17GA NDLS SYRG BLUNT STRL LF  10ML BD INTRLNK PLASTIC BXTR INTLNK ABT LS MCGAW SAFELINE (IV TUBING & ACCESSORIES) ×1 IMPLANT
CANNULA INJ 20GA 17GA 2 DEV HUB CAP BLUNT STRL LF  RD GRN BD TWINPAK STL PLASTIC (IV TUBING & ACCESSORIES) ×2 IMPLANT
CANNULA NASAL 14FT ANGL FLXB LIP PLATE CRSH RS LUM TUBE NFLR TP ADULT ARLF UCIT STD CURVE LF  DISP (CANNULA) ×1 IMPLANT
CANNULA NASAL 7FT ANGL FLXB LIP PLATE CRSH RS LUM TUBE FLR TIP ADULT ARLF UCIT STD CURVE LF  DISP (CANNULA) IMPLANT
CATH CRE 7.5FR 12-15MM 5.5CM 240CM WG BAL DIL ESOPH COLON PYL PEBAX STRL LF  2.8MM WRK CHNL F/G (Dilators) IMPLANT
CATH CRE 7.5FR 15-18MM 5.5CM 240CM WG BAL DIL ESOPH COLON PYL PEBAX STRL LF  2.8MM WRK CHNL F/G (Dilators) IMPLANT
CATH ELHMST GLD PROBE 10FR 300CM BIPOLAR RND DIST TIP STD CONN FIRM SHAFT HMGLD STRL DISP 3.7MM MN (DIAGNOSTIC) IMPLANT
CATH ELHMST GLD PROBE 7FR 300CM BIPOLAR RND DIST TIP STD CONN FIRM SHAFT HMGLD STRL DISP 2.8MM MN (DIAGNOSTIC) IMPLANT
CONV USE 48053 - SYRINGE MONOJECT 3ML LF  STRL LL TIP GRAD MED POLYPROP STD DISP (Syringes w/ o Needles) ×1 IMPLANT
CONV USE ITEM 343591 - SOLIDIFY FLUID 1500ML DSPNSR L_Q TX SOLIDIFY SFTP LTS+ DISP (STER) ×1 IMPLANT
DETERGENT INSTR 4GL MULTITIER_NEUT PH BDGR PROTEOLYTIC ACT (DIS) IMPLANT
DISCONTINUED NO SUB - JELLY LUB DYNALUBE BCTRST WATER SOL NGRS PKT STRL 5GM LF (WOUND CARE SUPPLY) ×2 IMPLANT
DISCONTINUED USE ITEM 339015 - CONTAINR STRL 10% NEUT BF FRMLN POLYPROP GRAD LEAK RST ORNG PREFL SCREW CAP FSHR HLTHCR PRTCL GRN (CHEM) IMPLANT
DISCONTINUED USE ITEM 82101 - TUBING OXYGEN 50/CS 001302 (TUBE/TUBING & SUCTION SUPPLIES) IMPLANT
ELECTRODE PATIENT RTN 9FT VLAB C30- LB RM PHSV ACRL FOAM CORD NONIRRITATE NONSENSITIZE ADH STRP (CAUTERY SUPPLIES) IMPLANT
FILTER PROBE SIDE FIRE 2.3MM 20132-217 INTEGRATED BX/10 (Dilators) IMPLANT
FORCEPS BIOPSY HOT 240CM 2.2MM RJ 4 +2.8MM DISP (INSTRUMENTS)
FORCEPS BIOPSY HOT 240CM 2.2MM RJ 4 +2.8MM DISPO (SURGICAL INSTRUMENTS) IMPLANT
FORCEPS BIOPSY MICROMESH TTH STREAMLINE CATH 240CM 2.4MM RJ 4 SS LRG CPC STRL DISP ORNG 2.8MM WRK (GUIDING) IMPLANT
FORCEPS BIOPSY NEEDLE 240CM 2.2MM RJ 4 2.8MM STD CPC STRL DISP ORNG (SURGICAL INSTRUMENTS) IMPLANT
LINER SUCT RD CRD MEDIVAC TW LOCK LID SHTOF VALVE CAN FILTER 1500CC LF  DISP (Suction) ×1 IMPLANT
LOOP ENDO DETACH 20MM MAJ340 (GU) IMPLANT
LOOP HLDR DTCH AUTOCLAV 30MM SURG NYL PLPCTM NONST LF  DISP (ENDOSCOPIC SUPPLIES) IMPLANT
NEEDLE SCLRTX 25GA 2.3MM OPTC TIP SHTH STRL LF DISP YW (NEEDLES & SYRINGE SUPPLIES) IMPLANT
PAD ENDOSCP MAINT 5X3.5IN KRINKLE CONTOUR RIDGE (TOOL) ×1 IMPLANT
PROBE ESURG 220CM 2.3MM FIAPC FLXB STR FIRE STRL DISP (CAUTERY SUPPLIES) IMPLANT
SET IV UNIV EXT DUAL Y W/SLIDE CLMP NEEDLELESS 2C6612 (IV TUBING & ACCESSORIES) IMPLANT
SNARE SM OVAL 240CM 2.4MM SNS LOOP SHRTHRW FLXB ENDOS PLPCTM 13MM STRL LF  DISP (DIAGNOSTIC) IMPLANT
SOLIDIFY FLUID 1500ML DSPNSR L_Q TX SOLIDIFY SFTP LTS+ DISP (STER) ×1
SYRINGE 5ML LF  STRL ST GRAD MED POLYPROP DISP (NEEDLES & SYRINGE SUPPLIES) ×1 IMPLANT
SYRINGE 60CC LUER LOCK 25/BX 1186600777 (Syringes w/ o Needles) IMPLANT
SYRINGE INFLAT ALN II GA STRL DISP 60ML (NEEDLES & SYRINGE SUPPLIES) IMPLANT
TRAP MUCOUS SPEC 10FR MST4000 (ANETHESIA SUPPLIES) IMPLANT
TUBING SUCT CLR 20FT 9/32IN MEDIVAC NCDTV M/M CONN STRL LF (Suction) IMPLANT
TUBING SUCT CLR 6FT 3/16IN MEDIVAC MXGR MALE TO MALE CONN NCDTV STRL LF (Suction) IMPLANT
TUBING SUCTION 7MM X 12FT N712 CS/20 (Connecting Tubes/Misc) ×1 IMPLANT
WATER STRL 500ML PLASTIC PR BTL LF (SOLUTIONS) ×1 IMPLANT

## 2009-01-06 NOTE — Nurses Notes (Signed)
Patient is stable and ready for transport back to area D. Skin is dry and intact.

## 2009-01-06 NOTE — OR PreOp (Signed)
Patient is ready for procedure. Skin is dry and intact.

## 2009-01-06 NOTE — OR PostOp (Signed)
Pt.left via [facility] w/staff assist, pca Rita.  All belongings w/ pt. Upon d/c.

## 2009-01-06 NOTE — OR PostOp (Signed)
Pt. C/o severe belly pain,  Stabbing feeling. 10+/10;  MD kandeel and assoc. Notified, orders received. Warm blankets applied to belly area.

## 2009-01-06 NOTE — Discharge Instructions (Signed)
SURGICAL DISCHARGE INSTRUCTIONS     Dr. Jarvis Morgan  performed your COLONOSCOPY today at the [facility]    [facility]:  Monday through Friday from 6 a.m. - 7 p.m.: [phone]  Between 7 p.m. - 6 a.m., weekends and holidays:  Call [facility] at [phone] or [phone].    PLEASE SEE WRITTEN HANDOUTS AS DISCUSSED BY YOUR NURSE:      SIGNS AND SYMPTOMS OF A WOUND / INCISION INFECTION   Be sure to watch for the following:   Increase in redness or red streaks near or around the wound or incision.  Increase in pain that is intense or severe and cannot be relieved by the pain medication that your doctor has given you.  Increase in swelling that cannot be relieved by elevation of a body part, or by applying ice, if permitted.  Increase in drainage, or if yellow / green in color and smells bad. This could be on a dressing or a cast.  Increase in fever for longer than 24 hours, or an increase that is higher than 101 degrees Fahrenheit (normal body temperature is 98 degrees Fahrenheit). The incision may feel warm to the touch.    **CALL YOUR DOCTOR IF ONE OR MORE OF THESE SIGNS / SYMPTOMS SHOULD OCCUR.    ANESTHESIA INFORMATION   ANESTHESIA -- ADULT PATIENTS:  You have received intravenous sedation / general anesthesia, and you may feel drowsy and light-headed for several hours. You may even experience some forgetfulness of the procedure. DO NOT DRIVE A MOTOR VEHICLE or perform any activity requiring complete alertness or coordination until you feel fully awake in about 24-48 hours. Do not drink alcoholic beverages for at least 24 hours. Do not stay alone, you must have a responsible adult available to be with you. You may also experience a dry mouth or nausea for 24 hours. This is a normal side effect and will disappear as the effects of the medication wear off.    REMEMBER   If you experience any difficulty breathing, chest pain, bleeding that you feel is excessive, persistent  nausea or vomiting or for any other concerns:  Call your physician Dr. Jan Fireman at [phone] or [phone]. You may also ask to have the  doctor on call paged. They are available to you 24 hours a day.    SPECIAL INSTRUCTIONS / COMMENTS   Encourage fluids.  If uncontrolled belly pain, call MD    FOLLOW-UP APPOINTMENTS   Please call patient services at [phone] or [phone] to schedule a date / time of return. They are open Monday - Friday from 7:30 am - 5:00 pm.

## 2009-01-06 NOTE — OR PostOp (Signed)
Pt. OOB to BR; gait steady, voided w/out problem.  Abdomen is very firm, pt. Is still c/o sever pain.  Will monitor.

## 2009-01-06 NOTE — OR PostOp (Signed)
Pt. States, pain is much better after medication 6/10.  Pt. Has been OOB to BR; gait steady, voided w/out problem.

## 2009-01-09 ENCOUNTER — Encounter (HOSPITAL_COMMUNITY): Payer: MEDICAID | Admitting: Surgery

## 2009-01-09 ENCOUNTER — Observation Stay
Admission: AD | Admit: 2009-01-09 | Discharge: 2009-01-10 | Disposition: A | Discharge status: Home / Self Care | Payer: MEDICAID | Attending: Surgery | Admitting: Surgery

## 2009-01-09 ENCOUNTER — Ambulatory Visit (INDEPENDENT_AMBULATORY_CARE_PROVIDER_SITE_OTHER): Payer: MEDICAID | Admitting: Family Medicine

## 2009-01-09 ENCOUNTER — Encounter (HOSPITAL_COMMUNITY): Payer: Self-pay

## 2009-01-09 ENCOUNTER — Ambulatory Visit (INDEPENDENT_AMBULATORY_CARE_PROVIDER_SITE_OTHER)
Admission: RE | Admit: 2009-01-09 | Discharge: 2009-01-09 | Disposition: A | Discharge status: Home / Self Care | Payer: MEDICAID | Source: Ambulatory Visit | Attending: Family Medicine | Admitting: Family Medicine

## 2009-01-09 ENCOUNTER — Ambulatory Visit (HOSPITAL_BASED_OUTPATIENT_CLINIC_OR_DEPARTMENT_OTHER): Payer: MEDICAID

## 2009-01-09 VITALS — BP 122/86 | HR 68 | Temp 99.0°F | Wt 205.0 lb

## 2009-01-09 DIAGNOSIS — R109 Unspecified abdominal pain: Principal | ICD-10-CM | POA: Insufficient documentation

## 2009-01-09 DIAGNOSIS — K219 Gastro-esophageal reflux disease without esophagitis: Secondary | ICD-10-CM | POA: Insufficient documentation

## 2009-01-09 DIAGNOSIS — K5710 Diverticulosis of small intestine without perforation or abscess without bleeding: Secondary | ICD-10-CM | POA: Insufficient documentation

## 2009-01-09 DIAGNOSIS — J309 Allergic rhinitis, unspecified: Secondary | ICD-10-CM | POA: Insufficient documentation

## 2009-01-09 DIAGNOSIS — F3289 Other specified depressive episodes: Secondary | ICD-10-CM | POA: Insufficient documentation

## 2009-01-09 LAB — BUN
BUN/CREAT RATIO: 12 (ref 6–22)
BUN: 10 mg/dL (ref 6–20)

## 2009-01-09 LAB — CBC/DIFF
BASOPHILS: 0 % (ref 0–1)
BASOS ABS: 0.016 10*3/uL (ref 0.0–0.2)
EOS ABS: 0.025 10*3/uL — ABNORMAL LOW (ref 0.1–0.3)
EOSINOPHIL: 0 % — ABNORMAL LOW (ref 1–6)
HCT: 39.1 % (ref 33.5–45.2)
HGB: 13.3 g/dL (ref 11.5–15.2)
LYMPHOCYTES: 39 % (ref 20–45)
LYMPHS ABS: 2.54 10*3/uL (ref 1.0–4.8)
MCH: 28.7 pg (ref 27.4–33.0)
MCHC: 34 g/dL (ref 31.6–35.5)
MCV: 84.5 fL (ref 82.0–99.0)
MONOCYTES: 8 % (ref 4–13)
MONOS ABS: 0.534 10*3/uL (ref 0.1–0.9)
MPV: 6.2 FL — ABNORMAL LOW (ref 7.4–10.4)
NRBC'S: 0 /100WBC
PLATELET COUNT: 388 10*3/uL (ref 140–450)
PMN ABS: 3.34 10*3/uL (ref 1.5–7.7)
PMN'S: 53 % (ref 40–75)
RBC: 4.63 MIL/uL (ref 3.84–5.04)
RDW: 13.2 % (ref 10.2–14.0)
WBC: 6.5 10*3/uL (ref 3.5–11.0)

## 2009-01-09 LAB — ELECTROLYTES
ANION GAP: 7 mmol/L (ref 5–16)
CARBON DIOXIDE: 28 mmol/L (ref 22–32)
CHLORIDE: 106 mmol/L (ref 96–111)
POTASSIUM: 4.5 mmol/L (ref 3.5–5.1)
SODIUM: 141 mmol/L (ref 136–145)

## 2009-01-09 LAB — ALK PHOS (ALKALINE PHOSPHATASE): ALKALINE PHOSPHATASE: 70 U/L (ref 38–126)

## 2009-01-09 LAB — BILIRUBIN TOTAL: BILIRUBIN, TOTAL: 0.4 mg/dL (ref 0.3–1.3)

## 2009-01-09 LAB — AST (SGOT): AST (SGOT): 24 U/L (ref 5–30)

## 2009-01-09 LAB — ALT (SGPT): ALT (SGPT): 25 U/L (ref 6–35)

## 2009-01-09 LAB — AMYLASE: AMYLASE: 60 U/L (ref <128)

## 2009-01-09 LAB — LIPASE: LIPASE: 43 U/L (ref 6–51)

## 2009-01-09 LAB — CREATININE
CREATININE: 0.83 mg/dL (ref 0.49–1.10)
ESTIMATED GLOMERULAR FILTRATION RATE: >59 mL/min/{1.73_m2} (ref >59)

## 2009-01-09 LAB — GAMMA GT: GAMMA GT: 16 U/L (ref 7–50)

## 2009-01-09 MED ORDER — DIATRIZOATE MEGLUMINE-DIATRIZOATE SODIUM 66 %-10 % ORAL SOLUTION
8.00 mL | ORAL | Status: AC
Start: 2009-01-09 — End: 2009-01-09
  Administered 2009-01-09: 8 mL via ORAL

## 2009-01-09 MED ORDER — POTASSIUM CHLORIDE 20 MEQ/L IN DEXTROSE 5 %-0.45 % SODIUM CHLORIDE IV
INTRAVENOUS | Status: DC
Start: 2009-01-09 — End: 2009-01-11

## 2009-01-09 MED ORDER — POTASSIUM CHLORIDE 20 MEQ/L IN DEXTROSE 5 %-0.45 % SODIUM CHLORIDE IV
INTRAVENOUS | Status: DC
Start: 2009-01-09 — End: 2009-01-09

## 2009-01-09 MED ORDER — IOVERSOL 320 MG IODINE/ML INTRAVENOUS SOLUTION
100.00 mL | INTRAVENOUS | Status: AC
Start: 2009-01-09 — End: 2009-01-09
  Administered 2009-01-09: 100 mL via INTRAVENOUS

## 2009-01-09 MED ORDER — OXYCODONE-ACETAMINOPHEN 5 MG-325 MG TABLET
1.00 | ORAL_TABLET | ORAL | Status: DC | PRN
Start: 2009-01-09 — End: 2009-01-11
  Administered 2009-01-09: 1 via ORAL
  Filled 2009-01-09: qty 1

## 2009-01-09 MED ORDER — HYDROMORPHONE (PF) 2 MG/ML INJECTION SYRINGE
0.2000 mg | INJECTION | Freq: Once | INTRAMUSCULAR | Status: AC
Start: 2009-01-09 — End: 2009-01-09
  Administered 2009-01-09: 0.2 mg via INTRAVENOUS

## 2009-01-09 MED ORDER — HYDROMORPHONE (PF) 2 MG/ML INJECTION SYRINGE
0.2000 mg | INJECTION | Freq: Once | INTRAMUSCULAR | Status: DC
Start: 2009-01-09 — End: 2009-01-09

## 2009-01-09 MED ORDER — ONDANSETRON HCL (PF) 4 MG/2 ML INJECTION SOLUTION
4.0000 mg | Freq: Three times a day (TID) | INTRAMUSCULAR | Status: DC | PRN
Start: 2009-01-09 — End: 2009-01-11

## 2009-01-09 MED ORDER — DULOXETINE 60 MG CAPSULE,DELAYED RELEASE
60.0000 mg | DELAYED_RELEASE_CAPSULE | Freq: Every day | ORAL | Status: DC
Start: 2009-01-09 — End: 2009-01-11
  Administered 2009-01-09: 0 mg via ORAL
  Administered 2009-01-10: 60 mg via ORAL
  Filled 2009-01-09 (×2): qty 1

## 2009-01-09 MED ORDER — ESOMEPRAZOLE MAGNESIUM 40 MG CAPSULE,DELAYED RELEASE
40.00 mg | DELAYED_RELEASE_CAPSULE | Freq: Every morning | ORAL | Status: DC
Start: 2009-01-10 — End: 2009-01-11
  Administered 2009-01-10: 40 mg via ORAL
  Filled 2009-01-09 (×2): qty 1

## 2009-01-09 MED ORDER — HYDROMORPHONE (PF) 2 MG/ML INJECTION SYRINGE
INJECTION | INTRAMUSCULAR | Status: DC
Start: 2009-01-09 — End: 2009-01-11
  Filled 2009-01-09: qty 1

## 2009-01-09 NOTE — Nurses Notes (Signed)
 Patient complained of abdominal pain 8/10.  Md notified.  0.2 mg dilaudid  given as ordered. Remains npo.  Family at bedside.

## 2009-01-09 NOTE — Nurses Notes (Signed)
Pt resting in bed respirations are easy and unlabored at present time. States pain is 3 in abd pt requesting food now will continue to monitor

## 2009-01-09 NOTE — Progress Notes (Signed)
 Urine Dip Results:   Time collected: 1406  Glucose: Negative  Bilirubin: Negative  Ketones: Small (15 mg/dl)  Specific Gravity: 4.782  Blood: Trace Hemolyzed  pH: 6.0  Protein: Trace  Urobilinogen: Normal   Nitrite: Negative  Leukocytes: Negative

## 2009-01-09 NOTE — Progress Notes (Signed)
SUBJECTIVE:  [name] is a 53 y.o. female  with complaint of abdominal pain for 3 days ever since her colonoscopy on 01/06/09.  Onset was sudden.  Pain is moderate, severe and ranks 6 on a scale of 0-10,  located in Suprapubic.  Other symptoms include lower abdominal cramps worse when she eats.  GI ROS: negative for nausea, vomiting, diarrhea and constipation, NO FEVER.  Still eating but less food than normal.  The patient denies dysuria, frequency or hematuria.    colonoscopy showed few diverticula, no polyps, no biopsy done.    I have reviewed the patient's medical, surgical, family and social history in detail and updated the computerized patient record.  Past Surgical History   Procedure Date   . Pb upper gi endoscopy,biopsy 12/19/07     patulous GE junction zone, erythema, nonerosive GERD   . Hx ankle fracture tx 2007     left distal fibula, casted   . Hx wrist fracture tx 2007     FOOSH injury   . Hx partial hysterectomy    . Colonoscopy 01/06/09     COLONOSCOPY performed by Augustin Schooling F at [facility] OR ENDO       Current outpatient prescriptions   Medication Sig   . duloxetine (CYMBALTA) 60 mg CpDR take 1 Cap by mouth Once a day.    Marland Kitchen NEXIUM 40 mg CpDR 2-40mg  tabs daily    . ASTELIN 137 mcg SprA 2 Sprays by Nasal route Twice daily. Use in each nostril as directed   . ADVIL 200 mg Tab 3, daily       ROS: Pertinent items are noted in HPI.  OBJECTIVE:  BP 122/86  Pulse 68  Temp 37.2 C (99 F)  Wt 92.987 kg (205 lb)  LMP Hysterectomy There is no height on file for this encounter.  General Appearance: mildly uncomfortable, but ambulatory, not toxic  Exam:   Lungs: clear to auscultation bilaterally, normal percussion bilaterally  Heart: regular rate and rhythm, S1, S2 normal, no murmur, click, rub or gallop  Abdomen: normal findings: no masses palpable, no organomegaly, abnormal findings:  tenderness marked in the lower abdomen mostly suprapubic, hypoactive bowel sounds  Rectal exam: rectal with tenderness  but  no masses.  Tiny amt heme neg stool present. Hemorrhoid tag external not inflammed.    Urine Dip Results:  Bilirubin: Negative  Specific Gravity: 1.015  Protein: Trace  Nitrite: Negative  Leukocytes: Negative    ASSESSMENT:  PLAN:  Encounter Diagnoses   Code Name Primary? Qualifier   . 789.00 Abdominal pain, unspecified site Yes     Plan: POCT URINE DIPSTICK,   STAT LABS: CBC/DIFF, AMYLASE, LIPASE, AST (SGOT), ALT (SGPT), ALK PHOS (ALKALINE PHOSPHATASE), GAMMA GT, ELECTROLYTES, CREATININE, BUN, BILIRUBIN TOTAL,   CT ABDOMEN PELVIS W IV CONTRAST -- took 45 mins to get authorization from insurance company to do CT, but patient was then able to be sent at 240 pm.  XR ABD FLAT AND UPRIGHT W/PA-LAT CHEST  Spoke to Dr. Jan Fireman who wanted to admit patient to his service and get CT scan immediately  Spoke to Dr. Nonah Mattes who said he would send a resident to see th patient      Jerry Caras, MD  [facility] FAMILY MEDICINE [facility]  FAMILY MEDICINE-HSC  [address]  [place], [place] [address]  [phone]

## 2009-01-09 NOTE — Progress Notes (Signed)
Report called to [facility] obs unit bed 8  Cledith Kamiya, MA

## 2009-01-09 NOTE — Nurses Notes (Incomplete)
Pt requesting solid food

## 2009-01-10 MED ORDER — CYCLOBENZAPRINE 5 MG TABLET
5.00 mg | ORAL_TABLET | Freq: Three times a day (TID) | ORAL | Status: DC
Start: 2009-01-10 — End: 2009-06-26

## 2009-01-10 MED ORDER — IBUPROFEN 600 MG TABLET
600.00 mg | ORAL_TABLET | Freq: Four times a day (QID) | ORAL | Status: DC | PRN
Start: 2009-01-10 — End: 2009-06-26

## 2009-01-10 NOTE — Discharge Summary (Signed)
[facility]                                DEPARTMENT OF SURGERY                                     DISCHARGE SUMMARY    PATIENT NAME: [name], [name] WUXLKG:[id number]  DATE OF BIRTH: 05-23-1956    ADMISSION DATE:01/09/2009  DISCHARGE DATE:01/10/2009    DISCHARGE DIAGNOSIS:  Abdominal pain.    DISCHARGE MEDICATIONS:    1.  The patient is to resume all her home medications:  2.  Ibuprofen as needed for pain.   3.  Flexeril as needed for pain.    DISCHARGE INSTRUCTIONS:  A.  Disposition:  The patient is to follow up with Dr. Jan Fireman on an as needed basis.  B.  Diet:  The patient can resume home diet.  C.  Activity:  The patient can resume her normal activities.    REASON FOR HOSPITALIZATION AND HOSPITAL COURSE:  This is a 53 year old lady who underwent a colonoscopy on January 06, 2009.  Following the procedure, she continued to have abdominal pain and on Thursday, January 09, 2009, she had severe abdominal pain causing her to seek treatment at her primary care physician.  Due to concern for possible perforation following the colonoscopy, the patient was admitted to the hospital and CT scan was done.  Physical exam revealed no peritoneal signs and it was consistent with musculoskeletal pain.  The CT scan was negative for any intraabdominal process.  The patient continued do well overnight.  It was felt that the patient was suitable for discharge on January 10, 2009.    CONDITION ON DISCHARGE:  A.  Ambulation:  Independent.  B.  Self-care ability:  Full.  C.  Cognitive status:  Awake, alert and oriented.      Vonda Antigua, MD  Resident  [facility] Department of Surgery    Jarvis Morgan, MD  Assistant Professor  [facility] Department of Surgery    [id number]; D: 01/10/2009 12:42:02; T: 01/10/2009 19:24:40    cc: Jerry Caras MD      Shirleen Schirmer

## 2009-01-10 NOTE — Nurses Notes (Signed)
Patient discharged to home stable ambulatory.

## 2009-01-10 NOTE — Nurses Notes (Signed)
 Patient sleeping appears comfortable, respirations easy and unlabored wakes to voice will continue to monitor Pain and nausea level

## 2009-01-10 NOTE — Nurses Notes (Signed)
 Patient states she still has some pain but refuses pain medication.  Tolerating small amount of regular diet.  Patient for discharge today.

## 2009-01-10 NOTE — Nurses Notes (Signed)
Patient sleeping appears comfortable, respirations easy and unlabored

## 2009-01-10 NOTE — Nurses Notes (Incomplete)
Pt sleeping wakes to voice command no complaints of pain verbalized

## 2009-01-10 NOTE — Nurses Notes (Signed)
Patient for discharge.  Reviewed discharge instructions with patient.  Reviewed follow up.  Medication reconciliation completed. Patient verbalizes understanding of discharge.

## 2009-01-31 ENCOUNTER — Encounter (HOSPITAL_COMMUNITY): Payer: MEDICAID | Admitting: Psychiatry

## 2009-02-18 ENCOUNTER — Ambulatory Visit (INDEPENDENT_AMBULATORY_CARE_PROVIDER_SITE_OTHER): Payer: MEDICAID | Admitting: Psychiatry

## 2009-02-18 MED ORDER — DULOXETINE 60 MG CAPSULE,DELAYED RELEASE
60.0000 mg | DELAYED_RELEASE_CAPSULE | Freq: Every day | ORAL | Status: DC
Start: 2009-02-18 — End: 2009-09-15

## 2009-02-18 MED ORDER — MELATONIN 3 MG TABLET
ORAL_TABLET | ORAL | Status: DC
Start: 2009-02-18 — End: 2009-06-26

## 2009-02-18 NOTE — Progress Notes (Signed)
[facility]  [facility]HAVIOR[facility]ICINE  [address]  [place], [place] [address]  Dept: [phone]  Loc: [phone]    Time in: 11:20 AM    Time out: 11:45 AM      SUBJECTIVE:   [name] is a 53 y.o. female who comes for a follow up. She has been taking her medication as prescribed and denied having any side effects.      Her main concern today is having difficulties falling and staying asleep. She has been struggling with this for a few years. She has arthritis and the pain sometimes keeps her up. She drinks "zips" of coffee threw out the day, usually until late in the evenings. She plays in the computer for 1-3 hours, mainly until close to 8:00 PM and then , she watches TV in her room. She keeps the TV on threw out the night keeping a low volume. She goes to bed around 9:00 PM and does not have a scheduled wake up time, except for 3 days a week when she gets her granddaughter. She denied taking daytime naps or drinking caffeinated sodas.    She has a good appetite, concentration and energy. She usually is in a good mood. She denied SHI. She denied having symptoms related to mania, anxiety or psychosis.      OBJECTIVE:  Appearance: Patient looks stated age.  Hygiene: Good.  Grooming: Good.  Psychomotor: No agitation or retardation.  Speech: Normal rate, volume and tone.  Eye contact: Good.  Mood: Euthymic.  Affect: Congruent.  Thought process: Linear.  Thought content: Negative for delusions, paranoia, obsessions.  Perceptual disturbances: No hallucinations.  Suicidal ideation: Denied.  Homicidal ideation: Denied.  Memory:  grossly intact.  Insight: Good  Judgment: Good.        MEDICATION LIST:  Current outpatient prescriptions prior to encounter   Medication Sig Dispense Refill    Ibuprofen (MOTRIN) 600 mg Tab take 1 Tab by mouth Four times a day as needed for Pain.   100  0    cyclobenzaprine (FLEXERIL) 5 mg Tab take 1 Tab by mouth Three times a day.   30  0    DISCONTD: duloxetine (CYMBALTA) 60 mg CpDR take 1  Cap by mouth Once a day.   1 month supply  2    NEXIUM 40 mg CpDR 2-40mg  tabs daily         ASTELIN 137 mcg SprA 2 Sprays by Nasal route Twice daily. Use in each nostril as directed        ADVIL 200 mg Tab 3, daily  3           ASSESSMENT/PLAN:  Encounter Diagnoses   Code Name Primary? 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 She was also recommended to start taking melatonin 3 mg at bedtime. She will continue on cymbalta 60 mg QD. She was advised to call if she had concerns or needed to be seen before scheduled appointment.     The patient would like to return in 3 months. I made a follow up appointment request for 3 months.    Marletta Lor, MD

## 2009-02-18 NOTE — Progress Notes (Signed)
I saw and evaluated the patient. I reviewed the resident's note. I agree with the findings and plan of care as documented in the resident's note. Any exceptions/additions are noted.

## 2009-03-18 ENCOUNTER — Ambulatory Visit (HOSPITAL_COMMUNITY): Payer: Self-pay | Admitting: Psychiatry

## 2009-03-18 NOTE — Telephone Encounter (Addendum)
03/18/2009  2:34 PM    Done.    Marletta Lor, MD      Triage Queue message copied by Marletta Lor on Tue Mar 18, 2009  2:31 PM  ------   Message from: Holly Bodily        Created: Tue Mar 18, 2009 11:12 AM    >> Holly Bodily Tue Mar 18, 2009  11:12 am  Dr Leretha Pol    The patient asked if you had received a letter concerning her getting a puppy at her apt complex.  She believes it was faxed to a hospital number but she had it addressed to you at [facility].   The patient is faxing the letter again.   She asked if you would call her today at [phone].   Thank you

## 2009-04-21 ENCOUNTER — Ambulatory Visit (INDEPENDENT_AMBULATORY_CARE_PROVIDER_SITE_OTHER): Payer: MEDICAID

## 2009-04-21 VITALS — BP 122/78 | HR 84 | Temp 97.4°F | Wt 207.0 lb

## 2009-04-21 MED ORDER — MELOXICAM 7.5 MG TABLET
7.5000 mg | ORAL_TABLET | Freq: Every day | ORAL | Status: DC
Start: 2009-04-21 — End: 2009-06-26

## 2009-04-21 NOTE — Progress Notes (Signed)
SUBJECTIVE: Patient started to have knee pain about 3 weeks ago. [facility] due to the pain going down her leg. She has been taking advil, motrin and lortabs with no relief.  She is taking advil or motrin every 4 hours. She took 1 lortab last night without relief. She is also icing her knee a few times a day. No trauma to the area this time. [facility] that the arthritis was the same as her last xray here. Feels also that she has swelling in her legs. In the past she did physical therapy.     Patient Active Problem List   Diagnoses Code   . Migraine Headaches 346.90   . GERD not well controlled 530.81   . Depression 296.30   . Osteoarthritis, Knee 715.96AC   . Patellofemoral Pain Syndrome 719.46D   . Multinodular Goiter 241.1R   . Microscopic Hematuria 599.72   . Acute abdominal pain 789.00G        Current outpatient prescriptions   Medication Sig   . duloxetine (CYMBALTA) 60 mg CpDR take 1 Cap by mouth Once a day.    . Melatonin 3 mg Tab take by mouth. Take one tab at bedtime    . Ibuprofen (MOTRIN) 600 mg Tab take 1 Tab by mouth Four times a day as needed for Pain.    . cyclobenzaprine (FLEXERIL) 5 mg Tab take 1 Tab by mouth Three times a day.    Marland Kitchen NEXIUM 40 mg CpDR 2-40mg  tabs daily    . ASTELIN 137 mcg SprA 2 Sprays by Nasal route Twice daily. Use in each nostril as directed   . ADVIL 200 mg Tab 3, daily        OBJECTIVE:   Blood pressure 122/78, pulse 84, temperature 36.3 C (97.4 F), weight 93.895 kg (207 lb), last menstrual period Hysterectomy.  General: Well appearing NAD  HEENT: MMM, normocephalic, atraumatic   Extremities: 5/5 strength in all extremities. Joint tenderness over left knee joint lines. Also swelling noted in left knee. No erythema. No crepetis.   Lymphatics: No peripheral edema noted.    ASSESSMENT: [name] is a 53 y.o. female with left knee pain.    PLAN:    Will start the patient on mobic with food. Also asked her to put ice on her knee 4 times a day. If no improvement with this I will have her start physical therapy.    Patient may call with any questions or concerns.    Olevia Bowens, MD

## 2009-04-21 NOTE — Progress Notes (Signed)
Prior to or immediately following the patient's dishcarge today, I reviewed and concur with the history, examination, test, diagnosis, and plan of the resident physician. Karen Fitzpatrick, MD

## 2009-04-23 ENCOUNTER — Ambulatory Visit (HOSPITAL_COMMUNITY): Payer: MEDICAID

## 2009-04-23 ENCOUNTER — Ambulatory Visit (HOSPITAL_COMMUNITY)
Admission: RE | Admit: 2009-04-23 | Discharge: 2009-04-23 | Disposition: A | Discharge status: Home / Self Care | Payer: MEDICAID | Source: Ambulatory Visit | Attending: INTERNAL MEDICINE | Admitting: INTERNAL MEDICINE

## 2009-04-23 ENCOUNTER — Ambulatory Visit (HOSPITAL_BASED_OUTPATIENT_CLINIC_OR_DEPARTMENT_OTHER)
Admission: RE | Admit: 2009-04-23 | Discharge: 2009-04-23 | Disposition: A | Discharge status: Home / Self Care | Payer: MEDICAID | Source: Ambulatory Visit | Attending: Family Medicine | Admitting: Family Medicine

## 2009-04-23 ENCOUNTER — Other Ambulatory Visit (HOSPITAL_COMMUNITY): Payer: Self-pay | Admitting: INTERNAL MEDICINE

## 2009-04-23 ENCOUNTER — Telehealth (INDEPENDENT_AMBULATORY_CARE_PROVIDER_SITE_OTHER): Payer: Self-pay | Admitting: Geriatric Medicine

## 2009-04-23 ENCOUNTER — Other Ambulatory Visit (INDEPENDENT_AMBULATORY_CARE_PROVIDER_SITE_OTHER): Payer: Self-pay | Admitting: Family Medicine

## 2009-04-23 ENCOUNTER — Ambulatory Visit
Admission: RE | Admit: 2009-04-23 | Discharge: 2009-04-23 | Disposition: A | Discharge status: Home / Self Care | Payer: MEDICAID | Attending: Family Medicine | Admitting: Family Medicine

## 2009-04-23 DIAGNOSIS — M899 Disorder of bone, unspecified: Secondary | ICD-10-CM | POA: Insufficient documentation

## 2009-04-23 DIAGNOSIS — Z780 Asymptomatic menopausal state: Secondary | ICD-10-CM | POA: Insufficient documentation

## 2009-04-23 DIAGNOSIS — Z13820 Encounter for screening for osteoporosis: Secondary | ICD-10-CM | POA: Insufficient documentation

## 2009-04-23 DIAGNOSIS — Z1231 Encounter for screening mammogram for malignant neoplasm of breast: Secondary | ICD-10-CM | POA: Insufficient documentation

## 2009-04-23 NOTE — Progress Notes (Signed)
I discussed the patient's care with the Resident prior to the patient leaving the clinic. Any significant discussion points are noted.    Jerry Caras, MD 04/23/2009, 10:31 PM

## 2009-06-25 ENCOUNTER — Ambulatory Visit
Admission: RE | Admit: 2009-06-25 | Discharge: 2009-06-25 | Disposition: A | Discharge status: Home / Self Care | Payer: MEDICAID | Attending: Otolaryngology | Admitting: Otolaryngology

## 2009-06-26 ENCOUNTER — Ambulatory Visit (INDEPENDENT_AMBULATORY_CARE_PROVIDER_SITE_OTHER): Payer: MEDICAID | Admitting: Family Medicine

## 2009-06-26 DIAGNOSIS — M25569 Pain in unspecified knee: Secondary | ICD-10-CM

## 2009-06-26 DIAGNOSIS — N816 Rectocele: Secondary | ICD-10-CM

## 2009-06-26 DIAGNOSIS — Z5181 Encounter for therapeutic drug level monitoring: Secondary | ICD-10-CM

## 2009-06-26 NOTE — Progress Notes (Signed)
SUBJECTIVE:  [name] is a 53 y.o. female is here today for Bladder Prolapse    And Left knee pain    Having vaginal fullness noted recently almost continuously.  Has hx of stress incontinence.  S/p hysterectomy.    Has chronic back pain but in last few weeks began to have new Left knee pain.  Felt that her knee was swollen and painful to bend it, worse with wt bearing and better with rest.  No particular injury or associated event.  Has hx of patellofemoral pain on R treated with PT some years ago with improvement.  This pain began more acutely but is also anterior knee pain.  Pain in the knee does not seem associated with her chronic low grade back pain and is clearly worsened by bending the knee.  Advil and Mobic didn't' seem to help.    I have reviewed the patient's medical, surgical, family and social history in detail and updated the computerized patient record.  Current outpatient prescriptions   Medication Sig   . duloxetine (CYMBALTA) 60 mg CpDR take 1 Cap by mouth Once a day.    Marland Kitchen NEXIUM 40 mg CpDR 2-40mg  tabs daily    . ASTELIN 137 mcg SprA 2 Sprays by Nasal route Twice daily. Use in each nostril as directed   . ADVIL 200 mg Tab 3, daily       ROS: Pertinent items are noted in HPI.  OBJECTIVE:   Vitals: BP 130/78  Pulse 84  Temp 36.4 C (97.6 F)  Ht 1.651 m (5\' 5" )  Wt 93.895 kg (207 lb)  LMP Hysterectomy   Appearance:in no apparent distress  Exam:   Lungs: clear to auscultation bilaterally, normal percussion bilaterally  Heart: regular rate and rhythm, S1, S2 normal, no murmur, click, rub or gallop  Pelvic exam: exam chaperoned by nurse, no palpable internal organs, rectocele noted to protrude with valsalva.    ASSESSMENT:   PLAN:  Encounter Diagnoses   Code Name Primary? Qualifier   . 719.46Y Left knee pain      Plan: AMB CONSULT/REFERRAL FAMILY MED-SPORTS MED, XR KNEE LEFT, AMB CONSULT/REFERRAL PHYS THERAPY- EXTERNAL  DICLOFENAC 75 mg bid renewed (same dose) --she'd been given this last  yr and believes she tolerated it     . 618.04 Rectocele      Plan: AMB CONSULT/REFERRAL UROGYNECOLOGY     . 719.46J Knee pain     . V58.83E Therapeutic drug monitoring for diclofenac      Plan: CBC/DIFF, ALT (SGPT), AST (SGOT), ELECTROLYTES soon     Return office visit in 6 months.   Advised to call back directly if there are further questions, or if these symptoms fail to improve as anticipated or worsen.  Jerry Caras, MD  [facility]  FAMILY MEDICINE-[facility]  [address]  [place], [place] [address]  [phone]

## 2009-06-26 NOTE — Patient Instructions (Signed)
LAB TESTS  Please go to the Family Medicine Lab or the [facility] Lab soon to have the following labs done:   . Cbc/diff   . Alt (sgpt)   . Ast (sgot)   . Electrolytes         Fasting is not required before this test.     You don't need an appointment for lab tests. The Family Medicine lab is open 8 am to 3:30 pm every weekday.  The [facility] lab is open Monday thru Thursday, 7:30 am to 7 pm, Friday 7:30 am to 6 pm, and Saturday 9:30 am to 1:30 pm.  You may go to whichever lab is most convenient for you.      XRAY ORDERED  Xray of left knee has been ordered by computer for you.  Go to [facility] 3rd floor Radiology/Xray to get your xray done.  No appointment needed and they are open 24 hrs.

## 2009-06-27 ENCOUNTER — Ambulatory Visit
Admission: RE | Admit: 2009-06-27 | Discharge: 2009-06-27 | Disposition: A | Discharge status: Home / Self Care | Payer: MEDICAID | Attending: Family Medicine | Admitting: Family Medicine

## 2009-06-27 ENCOUNTER — Ambulatory Visit (INDEPENDENT_AMBULATORY_CARE_PROVIDER_SITE_OTHER)
Admission: RE | Admit: 2009-06-27 | Discharge: 2009-06-27 | Disposition: A | Discharge status: Home / Self Care | Payer: MEDICAID | Source: Ambulatory Visit | Attending: Family Medicine | Admitting: Family Medicine

## 2009-06-27 ENCOUNTER — Ambulatory Visit (INDEPENDENT_AMBULATORY_CARE_PROVIDER_SITE_OTHER): Payer: MEDICAID | Admitting: Family Medicine

## 2009-06-27 VITALS — Wt 207.0 lb

## 2009-06-27 DIAGNOSIS — Z5181 Encounter for therapeutic drug level monitoring: Secondary | ICD-10-CM | POA: Insufficient documentation

## 2009-06-27 DIAGNOSIS — M25569 Pain in unspecified knee: Secondary | ICD-10-CM | POA: Insufficient documentation

## 2009-06-27 LAB — ELECTROLYTES
ANION GAP: 8 mmol/L (ref 5–16)
CARBON DIOXIDE: 29 mmol/L (ref 22–32)
CHLORIDE: 105 mmol/L (ref 96–111)
POTASSIUM: 3.8 mmol/L (ref 3.5–5.1)
SODIUM: 142 mmol/L (ref 136–145)

## 2009-06-27 LAB — CBC/DIFF
BASOPHILS: 1 % (ref 0–1)
BASOS ABS: 0.081 10*3/uL (ref 0.0–0.2)
EOS ABS: 0.042 10*3/uL — ABNORMAL LOW (ref 0.1–0.3)
EOSINOPHIL: 1 % (ref 1–6)
HGB: 13.8 g/dL (ref 11.5–15.2)
LYMPHOCYTES: 39 % (ref 20–45)
LYMPHS ABS: 2.47 10*3/uL (ref 1.0–4.8)
MCH: 28.9 pg (ref 27.4–33.0)
MCHC: 33.4 g/dL (ref 31.6–35.5)
MCV: 86.4 fL (ref 82.0–99.0)
MONOCYTES: 7 % (ref 4–13)
MONOS ABS: 0.466 10*3/uL (ref 0.1–0.9)
MPV: 6.2 FL — ABNORMAL LOW (ref 7.4–10.4)
NRBC'S: 0 /100WBC
PLATELET COUNT: 411 10*3/uL (ref 140–450)
PMN ABS: 3.21 10*3/uL (ref 1.5–7.7)
PMN'S: 52 % (ref 40–75)
RBC: 4.78 MIL/uL (ref 3.84–5.04)
RDW: 12.9 % (ref 10.2–14.0)
WBC: 6.3 10*3/uL (ref 3.5–11.0)

## 2009-06-27 LAB — ALT (SGPT): ALT (SGPT): 23 U/L (ref 7–45)

## 2009-06-27 LAB — AST (SGOT): AST (SGOT): 25 U/L (ref 8–41)

## 2009-06-27 MED ORDER — DICLOFENAC SODIUM 75 MG TABLET,DELAYED RELEASE
75.00 mg | DELAYED_RELEASE_TABLET | Freq: Two times a day (BID) | ORAL | Status: DC
Start: 2009-06-27 — End: 2009-12-03

## 2009-06-27 NOTE — Progress Notes (Signed)
Sports Medicine at [employer]   Department of [facility] Medicine    [employer] of [employer]   Progress Note   Subjective:   [name] is a 53 y.o. 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 I have previously recommended ice, diclofenac and PT. Last visit was in August 2009. The patient states she has had a flare up x 2 weeks of left greater than right knee pain. The patient rates the pain as a 5-7 out of 10, localized to the anterior aspect of the joint (worse on the left. The pain is worse with squats. There is no redness, bruising, swelling or mechanical sx. The patient has been to physical therapy at [facility] a year ago. Is not doing knee specific exercises at home. Last set of xrays was 1 year ago which revealed OA and patellar changes, no acute bony changes.  Other 10-point ROS: negative.   PMHx: GERD, headaches, depression.   SocHx: divorced. Nonsmoker. Works as a Arts administrator.    Objective:   General WNWD in NAD. Wt 93.895 kg (207 lb)   LMP Hysterectomy  Bilateral knees were examined. ROM = full. + Genu valgus. There is tenderness to palpation of the peripatellar structures. Palpation of the joint lines reveal tenderness medially. There is a mild effusion on exam today. The ligaments exams are negative. The meniscus exams are negative in the office today.   There is a normal neurovascular assessment distally.   There are no overlying skin erythema, ecchymosis, rashes or lesions.     Assessment:   1. OA knees flare up left >> right   2. PFA.     Plan:   1. Ice 15 minutes QID and prn.   2. I have re-written a script for physical therapy to improve the patient's symptoms and function. This is the primary treatment option for this patient at this time.   3. NSAID's: Diclofenac 75mg  PO BID with food for at least a week. The patient is to avoid other NSAID's concurrently.   4. Brace: Medi PT brace fitted today. Please see note.   5. Xrays were obtained today. I have independently reviewed the patient's xrays. I have discussed these findings with the patient. The xrays reveal no acute bony abnormalities. There are degenerative changes with mild joint space narrowing on the medial compartment. + patellar lateralization and tilting.   6. Follow up in 6 weks, sooner if worsening signs or symptoms.     Estrella Deeds, MD   Associate Professor   Director, Division of Sports Medicine   Director, Primary Care Sports Medicine Fellowship   Dept of Family Medicine   [facility] of Medicine

## 2009-07-11 ENCOUNTER — Encounter (INDEPENDENT_AMBULATORY_CARE_PROVIDER_SITE_OTHER): Payer: MEDICAID | Admitting: Otolaryngology

## 2009-07-14 ENCOUNTER — Ambulatory Visit (INDEPENDENT_AMBULATORY_CARE_PROVIDER_SITE_OTHER): Payer: MEDICAID | Admitting: Otolaryngology

## 2009-07-14 MED ORDER — ESOMEPRAZOLE MAGNESIUM 40 MG CAPSULE,DELAYED RELEASE
40.00 mg | DELAYED_RELEASE_CAPSULE | Freq: Every morning | ORAL | Status: DC
Start: 2009-07-14 — End: 2010-01-24

## 2009-07-17 ENCOUNTER — Ambulatory Visit (INDEPENDENT_AMBULATORY_CARE_PROVIDER_SITE_OTHER): Payer: MEDICAID | Admitting: Hospital-Specialty Hospital

## 2009-07-17 ENCOUNTER — Other Ambulatory Visit
Admission: RE | Admit: 2009-07-17 | Discharge: 2009-07-17 | Disposition: A | Discharge status: Home / Self Care | Payer: MEDICAID | Attending: Hospital-Specialty Hospital | Admitting: Hospital-Specialty Hospital

## 2009-07-17 VITALS — BP 110/70 | Ht 65.0 in | Wt 206.0 lb

## 2009-07-17 DIAGNOSIS — N816 Rectocele: Secondary | ICD-10-CM | POA: Insufficient documentation

## 2009-07-17 DIAGNOSIS — R32 Unspecified urinary incontinence: Secondary | ICD-10-CM | POA: Insufficient documentation

## 2009-07-19 LAB — URINE CULTURE

## 2009-07-24 ENCOUNTER — Encounter (INDEPENDENT_AMBULATORY_CARE_PROVIDER_SITE_OTHER): Payer: Self-pay | Admitting: Family Medicine

## 2009-07-24 ENCOUNTER — Ambulatory Visit (INDEPENDENT_AMBULATORY_CARE_PROVIDER_SITE_OTHER): Payer: Self-pay | Admitting: Family Medicine

## 2009-07-24 NOTE — Telephone Encounter (Signed)
I spoke to the patient about freqiuent hot flashes.  We aren't sure why her gyn doctor won't prescribe these but did discuss pros and cons of estrogen use.  She'll think about it and we'll discuss at next visit.  Theola Sequin, MD

## 2009-07-24 NOTE — Telephone Encounter (Signed)
Triage Queue message copied by Theola Sequin on Thu Jul 24, 2009 10:01 AM  ------   Message from: Melton Krebs   Created: Thu Jul 17, 2009 9:56 AM    >> Melton Krebs Thu Jul 17, 2009 9:56 am  PT CALLING AND STATES SHE JUST SAW DR. Benay Pillow AND HE WANTED HER TO CALL AND ASK YOU TO [facility]. PLEASE CALL HER BACK TO DISCUSS. THANKS  [id number] [address] [place] [address]  Phone: [phone] Fax: [phone]

## 2009-08-08 ENCOUNTER — Other Ambulatory Visit (INDEPENDENT_AMBULATORY_CARE_PROVIDER_SITE_OTHER): Payer: MEDICAID | Admitting: Family Medicine

## 2009-08-18 ENCOUNTER — Other Ambulatory Visit (HOSPITAL_COMMUNITY): Payer: Self-pay | Admitting: Psychiatry

## 2009-09-04 ENCOUNTER — Encounter (HOSPITAL_COMMUNITY): Payer: Self-pay

## 2009-09-04 ENCOUNTER — Ambulatory Visit (INDEPENDENT_AMBULATORY_CARE_PROVIDER_SITE_OTHER): Payer: MEDICAID | Admitting: Hospital-Specialty Hospital

## 2009-09-04 ENCOUNTER — Ambulatory Visit
Admission: RE | Admit: 2009-09-04 | Discharge: 2009-09-04 | Disposition: A | Discharge status: Home / Self Care | Payer: MEDICAID | Attending: Hospital-Specialty Hospital | Admitting: Hospital-Specialty Hospital

## 2009-09-04 VITALS — BP 118/68 | Ht 65.0 in | Wt 209.0 lb

## 2009-09-04 DIAGNOSIS — R9431 Abnormal electrocardiogram [ECG] [EKG]: Secondary | ICD-10-CM | POA: Insufficient documentation

## 2009-09-04 DIAGNOSIS — Z419 Encounter for procedure for purposes other than remedying health state, unspecified: Secondary | ICD-10-CM | POA: Insufficient documentation

## 2009-09-04 DIAGNOSIS — Z01818 Encounter for other preprocedural examination: Secondary | ICD-10-CM | POA: Insufficient documentation

## 2009-09-04 HISTORY — DX: Unspecified osteoarthritis, unspecified site: M19.90

## 2009-09-04 NOTE — Progress Notes (Signed)
[facility]   Department of Obstetrics & Gynecology   Division of Urogynecology & Reconstructive Pelvic Surgery    Preoperative Orders    Name: [name]  MRN: [id number]  DOB: 02-02-1956  Date:  09/04/2009    1.  Ancef 2 grams IVSS on call to OR.           2.  EKG.    4.  Lovenox 40-mg sc 2 hours pre-op.        Terilynn Buresh HM Benay Pillow, MD

## 2009-09-04 NOTE — Progress Notes (Signed)
[facility]   Department of Obstetrics & Gynecology   Division of Urogynecology & Reconstructive Pelvic Surgery    Pre-operative Risk Assessment     Name: Adelle Zachar  MRN: [id number]  DOB: Sep 12, 1956  Date: 09/04/2009      A. Major cardiac risk factors that require pre-op medical evaluation:         1.  Unstable or severe angina. No.    Easily fatiguing with exertion.   Achiness in shoulder, neck, and jaw.   Heaviness in left or right arm.        2.  Decompensated CHF - NYHA class IV.  No.    Dyspnea, orthopnea, fatigue, pedal edema.        3.  MI less than one month ago.  No.         4.  Uncontrolled/unstable arrhythmias:             a. High grade AV block.  No.             b. Symptomatic or newly dx ventricular tachycardia such as Mobitz          II or third degree AV block.  No.             c. SVT with HR >100, at rest.  No.         5.  Severe/high grade valvular lesions:             a. Severe AS w/ mean valve area<1-cm2, mean pressure                gradient >40 mmHg, or CP/CHF/or light-headedness.  No.             b. Symptomatic MS - exertional presyncope, heart failure,                progressive dyspnea on exertion.  No.     B.  Intermediate risk surgery (cardiac risk 1-5%) - intraperitoneal surgery         1.  >4 METS + one or less medical risk factor - proceed with surgery.           C.  EKG - 45 years or older.  Yes.     D.  Pulmonary risk factors.   1.  Breathlessness or wheezing @ rest - No.   2.  Breathlessness or wheezind w/ <4 METs - No.   3.  Breathlessness or wheezing w/ 4 or more METs - No.     E.  Family history of DVT.  No.     F.  Risk level for DVT: High.    G.  Endocarditis prophylaxis: No.      H.  Risk for adrenal insufficiency - >20 mg of prednisone or equivalent for 5 or more   days.  No.        Definitions:  1. Poor functional status - cannot perform up to 4 METs of activity:       a. Abel to climbed one flights of stairs without severe fatigue.        b. Able to walk a block at a brisk pace.       c. Able to run for a short distance.       d. Able to perform heavy work around the house such as mowing   the lawn or scrubbing the floor.       e. Able to participate in sporting  events       2. 4 METs of activity - walking on a flat surface, climbing flight of stairs, or light      housework without CP, dyspnea, or fatigue.                                                                                                                                                                                                                                         3. Clinical risk factors:       a. Hx of ischemic heart disease.       b. History of compensated or prior CHF.       c. Renal insufficiency (Cr >2 mg/dL).       d. DM.       e. History of CVA.    4. Risk levels for DVT      1.  Low risk: 40 or less and surgery lasting less than 30 minutes.      2.  Medium risk: >40, surgery of any duration, and no other clinical risk factors.      3.  High risk: >40 + risk factors.   a. Prior DVT or PE.   b. VV.   c. Infection.   d. Malignancy.   e. HRT.    f. Obesity.   g. Prolonged surgery.        Jojo Pehl HM Benay Pillow, MD

## 2009-09-04 NOTE — Progress Notes (Signed)
[facility]   Department of Obstetrics & Gynecology   Division of Urogynecology & Reconstructive Pelvic Surgery    History & Physical     Name: [name]   MRN: [id number]   DOB: 12/30/1955   Date: 09/04/2009       [name] is a 53 y.o. WSF, P3, with symptomatic vaginal prolapse, schedued for surgical repair.     OAB symptoms have resolved with the prescribed therapy.  BM: qd, +strain, +hard & lumpy stool. +splint, +incomplete emptying, -blockage.   +pain prior to BM, +pain changes frequency of BM, -pain changes stool, +bloating, -mucus.   Colace several times weekly.       PMH:   1. Left loose knee cap.   2. GERD.   3. Depression.   4. Cardiac stress test done last year was negative.     PSH:   1. TAH+USO.   2. Ulnar nerve surgery on her left elbow.   3. Appendectomy.   4. Cholecystectomy.     Medications:   1. Nexium 40 mg qd.   2. Cymbalta 60 mg qd.   3. Dicolfenac SOD SC 75 mg bid.     Allergies:   1. Codeine - pruritis.   2. Mobic - N&V.   No known latex, iodine, or adhesive tape allergy.    ROS: Ex-smoker x 3 years.      PE - healthy female.   BP-118/68, wt-209, ht-65 ins.   Lungs - clear.  CVS - RRR.  Abdomen - soft. A low transverse incision is present. There is no mass, tenderness, incisional or inguinal hernia.   Spine and lower extremities have normal ROM.   Speculum - normal external female genitalia.   Sl gaping introitus.  Mild cuff descensus.   Rugae present in the distal posterior wall.   Cough stress test is negative in the supine position without bladder filling and wit the prolapse reduced.   POPQ: -2,-2,-6,4,4,7,3,3,n/a.       Assessment: Stage III Bp vaginal prolapse  Have >4 METS with no clinical risk factors.     Plan: findings, treatment options, advantages & disadvantages of each discussed in detail.   : [name] feels that her condition is severely interfering with her daily activities and the QOL and wants to proceed with surgical repair.    : Posterior and possible enterocele repair, possible uterosacral ligament vaginal vault suspension, and cystoscopy   : The proposed procedure, possible complication, failure rate, and elective nature of procedure explained.   : Avoid ASA and NSAIDs for two weeks prior to surgery.    [name] HM Benay Pillow, MD

## 2009-09-04 NOTE — Progress Notes (Signed)
[facility] of Medicine   Department of Obstetrics & Gynecology   Division of Urogynecology & Reconstructive Pelvic Surgery     Your surgery is scheduled for December 1st.  Things for Ms. Lapp to do before her surgery.     1. Do not take the following or similar products after November 17th:   A) Mobic,    B) Diclofenac,    C) Aspirin or Ecotrin,    D) Motrin or ibuprofen,    E) Advil,    F) Aleve,    G) Naproxen.       You may take tylenol.     2. Also, do not take vitamin E, fishoil, products from [employer], or herbal medicine after November 17th      Liz Beach, MD

## 2009-09-15 ENCOUNTER — Ambulatory Visit (INDEPENDENT_AMBULATORY_CARE_PROVIDER_SITE_OTHER): Payer: MEDICAID | Admitting: Psychiatry

## 2009-09-15 MED ORDER — DULOXETINE 60 MG CAPSULE,DELAYED RELEASE
60.0000 mg | DELAYED_RELEASE_CAPSULE | Freq: Every day | ORAL | Status: DC
Start: 2009-09-15 — End: 2009-10-09

## 2009-09-15 NOTE — Progress Notes (Signed)
I saw and evaluated the patient. I reviewed the resident's note. I agree with the findings and plan of care as documented in the resident's note. Any exceptions/additions are noted.      Shanik Brookshire Rollynn Cherika Jessie, MD 09/15/2009, 3:41 PM

## 2009-09-15 NOTE — Progress Notes (Signed)
[facility]  BEHAVIORAL MEDICINE  [address]  [place], [place] [address]  Dept: [phone]  Loc: [phone]    Time in: 2:30 PM    Time out: 2:50 PM      CHIEF COMPLAINT:  Medication refill.      SUBJECTIVE:   [name] is a 53 y.o. female from [place], [place] who comes for a follow up. She was last evaluated on 02/18/2009 for MDD.  At that time, she was continued on Cymbalta 60 mg daily and was educated about sleep hygiene.  She has been taking her medication as prescribed and denied having any side effects.    She stated that the medication is helping her. Her mood is "good". She denied crying spells, feeling hopeless or helpless or having SHI. She has been experiencing difficulties falling and staying asleep, even with decreasing the caffeine intake to 2 cups in the morning.  She tries to turn off the computer couple of hours before her bedtime. She tried melatonin 3 mg QHS, but, it didn't help much. Her energy level is low, 3/10 - (10 the best). She did not try higher doses of melatonin. She denied having symptoms related to mania, anxiety or psychosis.     The patient stated that she has had some stressors going on including "my car broke down" and she is going to have Rectocele repair on 09/24/09. She has a limited support system. She would like to continue Cymbalta 60 mg daily.      OBJECTIVE:  Orientation: x3  Appearance: Patient looks stated age.  Hygiene: Good.  Grooming: Good.  Psychomotor: No agitation or retardation.  Speech: Normal rate, volume and tone.  Eye contact: Good.  Mood: euthymic.  Affect: broad.  Thought process: Linear.  Thought content: Negative for delusions, paranoia, obsessions.  Perceptual disturbances: No hallucinations.  Suicidal ideation: Denied.  Homicidal ideation: Denied.  Memory:  grossly intact.  Insight: Good  Judgment: Good.      MEDICATION LIST:  Current outpatient prescriptions prior to encounter   Medication Sig Dispense Refill    esomeprazole magnesium (NEXIUM)  40 mg CpDR take 1 Cap by mouth Daily before Breakfast.   90  3    Diclofenac Sodium (VOLTAREN) 75 mg TbEC take 1 Tab by mouth Twice daily.   60  6    DISCONTD: duloxetine (CYMBALTA) 60 mg CpDR take 1 Cap by mouth Once a day.   1 month supply  2    ASTELIN 137 mcg SprA 2 Sprays by Nasal route Twice daily. Use in each nostril as directed             ASSESSMENT/PLAN:  Encounter Diagnoses   Code Name Primary? 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 She was educated about possible sleep aids, like, medications - (vistaril or trazodone) and sleep machine to create white noise. Will talk more about her sleep on her next appointment. She was advised to call if she had concerns, or experienced side effects from the medications. Also, she was advised to go to the ED or call 911 if she had thoughts of harming herself or felt unsafe. Follow up in 3 weeks or before if needed.      Marletta Lor, MD

## 2009-09-16 ENCOUNTER — Ambulatory Visit: Payer: MEDICAID | Attending: Internal Medicine | Admitting: Internal Medicine

## 2009-09-16 VITALS — BP 120/70 | HR 131 | Ht 65.0 in | Wt 207.0 lb

## 2009-09-16 DIAGNOSIS — Z01818 Encounter for other preprocedural examination: Secondary | ICD-10-CM | POA: Insufficient documentation

## 2009-09-16 DIAGNOSIS — Z9889 Other specified postprocedural states: Secondary | ICD-10-CM | POA: Insufficient documentation

## 2009-09-16 DIAGNOSIS — E042 Nontoxic multinodular goiter: Secondary | ICD-10-CM | POA: Insufficient documentation

## 2009-09-16 DIAGNOSIS — R9431 Abnormal electrocardiogram [ECG] [EKG]: Secondary | ICD-10-CM | POA: Insufficient documentation

## 2009-09-16 DIAGNOSIS — F341 Dysthymic disorder: Secondary | ICD-10-CM | POA: Insufficient documentation

## 2009-09-16 DIAGNOSIS — N8110 Cystocele, unspecified: Secondary | ICD-10-CM | POA: Insufficient documentation

## 2009-09-16 DIAGNOSIS — G43909 Migraine, unspecified, not intractable, without status migrainosus: Secondary | ICD-10-CM | POA: Insufficient documentation

## 2009-09-16 DIAGNOSIS — R079 Chest pain, unspecified: Secondary | ICD-10-CM | POA: Insufficient documentation

## 2009-09-16 DIAGNOSIS — K219 Gastro-esophageal reflux disease without esophagitis: Secondary | ICD-10-CM | POA: Insufficient documentation

## 2009-09-23 ENCOUNTER — Ambulatory Visit (HOSPITAL_BASED_OUTPATIENT_CLINIC_OR_DEPARTMENT_OTHER)
Admission: RE | Admit: 2009-09-23 | Discharge: 2009-09-23 | Disposition: A | Discharge status: Home / Self Care | Payer: MEDICAID | Source: Ambulatory Visit | Attending: Internal Medicine | Admitting: Internal Medicine

## 2009-09-23 ENCOUNTER — Ambulatory Visit
Admission: RE | Admit: 2009-09-23 | Discharge: 2009-09-23 | Disposition: A | Discharge status: Home / Self Care | Payer: MEDICAID | Attending: Internal Medicine | Admitting: Internal Medicine

## 2009-09-23 ENCOUNTER — Ambulatory Visit (HOSPITAL_BASED_OUTPATIENT_CLINIC_OR_DEPARTMENT_OTHER): Payer: MEDICAID

## 2009-09-23 DIAGNOSIS — R9431 Abnormal electrocardiogram [ECG] [EKG]: Secondary | ICD-10-CM | POA: Insufficient documentation

## 2009-09-23 MED ORDER — CEFAZOLIN 10 GRAM SOLUTION FOR INJECTION
2.00 g | Freq: Once | INTRAMUSCULAR | Status: DC
Start: 2009-09-24 — End: 2009-09-24
  Filled 2009-09-23 (×2): qty 20

## 2009-09-23 NOTE — Student (Addendum)
09/23/2009  [name]  [id number]    Pre-Op Note    Date: September 24, 2009  Start Time: 08:15  End Time: 11:30    Surgeon: Dr. Benay Pillow    Pre-Op Diagnosis: Stage III Bp Vaginal Prolapse    Procedure:  Anterior and Posterior Vaginla repair with cystoscopy    Labs:    Results for orders placed during the hospital encounter of 07/17/2009 (from the past 2160 hours)   URINE CULTURE (OUTPT)    Collection Time 07/17/09 12:17 PM   Component Value Range   . SPECIMEN DESCRIPTION CLEAN CATCH URINE     . SPECIAL REQUESTS NONE     . GRAM STAIN NOT REPORTED     . CULTURE OBSERVATION        Value: Multiple organisms present, suggestive of contamination.  If further workup is needed contact Laboratory Director.  Plates will be held 3 days.   Marland Kitchen REPORT STATUS        Value: 07/19/2009      FINAL   Results for orders placed during the hospital encounter of 06/27/2009 (from the past 2160 hours)   CBC/DIFF    Collection Time 06/27/09 11:36 AM   Component Value Range   . WBC 6.3  3.5 - 11.0 (THOU/UL)   . RBC 4.78  3.84 - 5.04 (MIL/uL)   . HGB 13.8  11.5 - 15.2 (g/dL)   . HCT 41.4  33.5 - 45.2 (%)   . MCV 86.4  82.0 - 99.0 (fL)   . MCH 28.9  27.4 - 33.0 (pg)   . MCHC 33.4  31.6 - 35.5 (g/dL)   . RDW 12.9  10.2 - 14.0 (%)   . PLATELET COUNT 411  140 - 450 (THO/UL)   . MPV 6.2 (*) 7.4 - 10.4 (FL)   . PMN'S 52  40 - 75 (%)   . PMN ABS 3.210  1.5 - 7.7 (THOU/uL)   . LYMPHOCYTES 39  20 - 45 (%)   . LYMPHS ABS 2.470  1.0 - 4.8 (THOU/uL)   . MONOCYTES 7  4 - 13 (%)   . MONOS ABS 0.466  0.1 - 0.9 (THOU/uL)   . EOSINOPHIL 1  1 - 6 (%)   . EOS ABS 0.042 (*) 0.1 - 0.3 (THOU/uL)   . BASOPHILS 1  0 - 1 (%)   . BASOS ABS 0.081  0.0 - 0.2 (THOU/uL)   . NRBC'S 0  0 (/100WBC)   ALT (SGPT)    Collection Time 06/27/09 11:36 AM   Component Value Range   . ALT (SGPT) 23  7 - 45 (U/L)   AST (SGOT)    Collection Time 06/27/09 11:36 AM   Component Value Range   . AST (SGOT) 25  8 - 41 (U/L)   ELECTROLYTES    Collection Time 06/27/09 11:36 AM   Component Value Range     . SODIUM 142  136 - 145 (mmol/L)   . POTASSIUM 3.8  3.5 - 5.1 (mmol/L)   . CHLORIDE 105  96 - 111 (mmol/L)   . CARBON DIOXIDE 29  22 - 32 (mmol/L)   . ANION GAP 8  5 - 16 (mmol/L)     Chest X-Ray:    None found  Myocardial Perfusion Scan (09/23/2009):  IMPRESSION:   1. Normal left ventricular systolic function.   2. Normal maximal exercise myocardial perfusion scan with no evidence of myocardial ischemia nor scar.  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 Ancef 2,000 mg in D5W 50 mL IVPB  2. Lovenox 40 mg/0.46mL SubQ injection 2 hours preop  3. LR 1000 mL premix infusion  Mathis Dad, MED STUDENT 09/23/2009, 7:58 PM

## 2009-09-24 ENCOUNTER — Encounter (HOSPITAL_COMMUNITY): Payer: MEDICAID | Admitting: Hospital-Specialty Hospital

## 2009-09-24 ENCOUNTER — Inpatient Hospital Stay
Admission: RE | Admit: 2009-09-24 | Discharge: 2009-09-26 | DRG: 747 | Disposition: A | Discharge status: Home / Self Care | Payer: MEDICAID | Attending: Hospital-Specialty Hospital | Admitting: Hospital-Specialty Hospital

## 2009-09-24 ENCOUNTER — Encounter (HOSPITAL_COMMUNITY): Payer: Self-pay

## 2009-09-24 ENCOUNTER — Encounter (HOSPITAL_COMMUNITY)
Admission: RE | Disposition: A | Discharge status: Home / Self Care | Payer: Self-pay | Source: Ambulatory Visit | Attending: Hospital-Specialty Hospital

## 2009-09-24 ENCOUNTER — Encounter (HOSPITAL_BASED_OUTPATIENT_CLINIC_OR_DEPARTMENT_OTHER): Payer: Self-pay | Admitting: Hospital-Specialty Hospital

## 2009-09-24 DIAGNOSIS — N816 Rectocele: Secondary | ICD-10-CM | POA: Diagnosis present

## 2009-09-24 DIAGNOSIS — N736 Female pelvic peritoneal adhesions (postinfective): Secondary | ICD-10-CM | POA: Diagnosis present

## 2009-09-24 DIAGNOSIS — K219 Gastro-esophageal reflux disease without esophagitis: Secondary | ICD-10-CM | POA: Diagnosis present

## 2009-09-24 DIAGNOSIS — F3289 Other specified depressive episodes: Secondary | ICD-10-CM | POA: Diagnosis present

## 2009-09-24 DIAGNOSIS — Z87891 Personal history of nicotine dependence: Secondary | ICD-10-CM

## 2009-09-24 DIAGNOSIS — K5900 Constipation, unspecified: Secondary | ICD-10-CM | POA: Diagnosis not present

## 2009-09-24 DIAGNOSIS — N8110 Cystocele, unspecified: Principal | ICD-10-CM | POA: Diagnosis present

## 2009-09-24 HISTORY — PX: HX CYSTOCELE REPAIR: SHX163

## 2009-09-24 SURGERY — REPAIR ANTERIOR AND POSTERIOR
Anesthesia: General | Wound class: Clean Contaminated Wounds-The respiratory, GI, Genital, or urinary

## 2009-09-24 MED ORDER — NEOSTIGMINE METHYLSULFATE 1 MG/ML INJECTION SOLUTION
INTRAMUSCULAR | Status: AC
Start: 2009-09-24 — End: 2009-09-24
  Filled 2009-09-24: qty 10

## 2009-09-24 MED ORDER — METOPROLOL TARTRATE 5 MG/5 ML INTRAVENOUS SOLUTION
INTRAVENOUS | Status: AC
Start: 2009-09-24 — End: 2009-09-24
  Filled 2009-09-24: qty 5

## 2009-09-24 MED ORDER — ONDANSETRON HCL (PF) 4 MG/2 ML INJECTION SOLUTION
4.00 mg | Freq: Four times a day (QID) | INTRAMUSCULAR | Status: DC | PRN
Start: 2009-09-24 — End: 2009-09-25

## 2009-09-24 MED ORDER — DEXAMETHASONE SODIUM PHOSPHATE 10 MG/ML INJECTION SOLUTION
INTRAMUSCULAR | Status: AC
Start: 2009-09-24 — End: 2009-09-24
  Filled 2009-09-24: qty 1

## 2009-09-24 MED ORDER — CONJUGATED ESTROGENS 0.625 MG/GRAM VAGINAL CREAM
0.50 g | TOPICAL_CREAM | Freq: Once | VAGINAL | Status: DC | PRN
Start: 2009-09-24 — End: 2009-09-24
  Administered 2009-09-24: 0.5 g via TOPICAL

## 2009-09-24 MED ORDER — HYDROMORPHONE (PF) 2 MG/ML INJECTION SYRINGE
INJECTION | INTRAMUSCULAR | Status: AC
Start: 2009-09-24 — End: 2009-09-24
  Filled 2009-09-24: qty 1

## 2009-09-24 MED ORDER — ENOXAPARIN 40 MG/0.4 ML SUBCUTANEOUS SYRINGE
40.00 mg | INJECTION | SUBCUTANEOUS | Status: DC
Start: 2009-09-24 — End: 2009-09-24
  Administered 2009-09-24: 40 mg via SUBCUTANEOUS
  Filled 2009-09-24: qty 0.4

## 2009-09-24 MED ORDER — MIDAZOLAM 1 MG/ML INJECTION SOLUTION
INTRAMUSCULAR | Status: AC
Start: 2009-09-24 — End: 2009-09-24
  Filled 2009-09-24: qty 2

## 2009-09-24 MED ORDER — GLYCOPYRROLATE 0.2 MG/ML INJECTION SOLUTION
INTRAMUSCULAR | Status: AC
Start: 2009-09-24 — End: 2009-09-24
  Filled 2009-09-24: qty 5

## 2009-09-24 MED ORDER — DEXTROSE 5 % AND 0.45 % SODIUM CHLORIDE INTRAVENOUS SOLUTION
INTRAVENOUS | Status: DC
Start: 2009-09-24 — End: 2009-09-25

## 2009-09-24 MED ORDER — DEXTROSE 5 % AND 0.45 % SODIUM CHLORIDE INTRAVENOUS SOLUTION
INTRAVENOUS | Status: DC
Start: 2009-09-24 — End: 2009-09-24

## 2009-09-24 MED ORDER — LIDOCAINE-EPINEPHRINE 0.5 %-1:200,000 INJECTION SOLUTION
INTRAMUSCULAR | Status: AC
Start: 2009-09-24 — End: 2009-09-24
  Filled 2009-09-24: qty 50

## 2009-09-24 MED ORDER — DIPHENHYDRAMINE 50 MG/ML INJECTION SOLUTION
25.00 mg | Freq: Four times a day (QID) | INTRAMUSCULAR | Status: DC | PRN
Start: 2009-09-24 — End: 2009-09-25

## 2009-09-24 MED ORDER — ESOMEPRAZOLE MAGNESIUM 40 MG CAPSULE,DELAYED RELEASE
40.0000 mg | DELAYED_RELEASE_CAPSULE | Freq: Every morning | ORAL | Status: DC
Start: 2009-09-24 — End: 2009-09-26
  Administered 2009-09-24 – 2009-09-26 (×3): 40 mg via ORAL
  Filled 2009-09-24 (×3): qty 1

## 2009-09-24 MED ORDER — FENTANYL (PF) 50 MCG/ML INJECTION SOLUTION
INTRAMUSCULAR | Status: AC
Start: 2009-09-24 — End: 2009-09-24
  Filled 2009-09-24: qty 4

## 2009-09-24 MED ORDER — HYDROMORPHONE 50 MG/50 ML IN 0.9 % SODIUM CHLORIDE INJECTION
1.00 | INTRAMUSCULAR | Status: DC | PRN
Start: 2009-09-24 — End: 2009-09-25
  Administered 2009-09-24: 1 via INTRAVENOUS
  Filled 2009-09-24: qty 1

## 2009-09-24 MED ORDER — LACTATED RINGERS INTRAVENOUS SOLUTION
INTRAVENOUS | Status: DC
Start: 2009-09-24 — End: 2009-09-24

## 2009-09-24 MED ORDER — ATROPINE 0.4 MG/ML INJECTION SOLUTION
INTRAMUSCULAR | Status: AC
Start: 2009-09-24 — End: 2009-09-24
  Filled 2009-09-24: qty 2

## 2009-09-24 MED ORDER — SUCCINYLCHOLINE CHLORIDE 20 MG/ML INJECTION SOLUTION
INTRAMUSCULAR | Status: AC
Start: 2009-09-24 — End: 2009-09-24
  Filled 2009-09-24: qty 10

## 2009-09-24 MED ORDER — HYDROMORPHONE (PF) 2 MG/ML INJECTION SYRINGE
0.20 mg | INJECTION | INTRAMUSCULAR | Status: DC | PRN
Start: 2009-09-24 — End: 2009-09-24
  Administered 2009-09-24 (×2): 0.4 mg via INTRAVENOUS
  Administered 2009-09-24: 0.2 mg via INTRAVENOUS

## 2009-09-24 MED ORDER — LIDOCAINE (PF) 100 MG/5 ML (2 %) INTRAVENOUS SYRINGE
INJECTION | INTRAVENOUS | Status: AC
Start: 2009-09-24 — End: 2009-09-24
  Filled 2009-09-24: qty 5

## 2009-09-24 MED ORDER — ROCURONIUM 10 MG/ML INTRAVENOUS SOLUTION
INTRAVENOUS | Status: AC
Start: 2009-09-24 — End: 2009-09-24
  Filled 2009-09-24: qty 5

## 2009-09-24 MED ORDER — ONDANSETRON HCL (PF) 4 MG/2 ML INJECTION SOLUTION
INTRAMUSCULAR | Status: AC
Start: 2009-09-24 — End: 2009-09-24
  Filled 2009-09-24: qty 2

## 2009-09-24 MED ORDER — SODIUM CHLORIDE 0.9 % IRRIGATION SOLUTION
1000.00 mL | Status: DC | PRN
Start: 2009-09-24 — End: 2009-09-24
  Administered 2009-09-24: 1000 mL

## 2009-09-24 MED ORDER — ENOXAPARIN 40 MG/0.4 ML SUBCUTANEOUS SYRINGE
40.0000 mg | INJECTION | Freq: Once | SUBCUTANEOUS | Status: AC
Start: 2009-09-24 — End: 2009-09-24
  Administered 2009-09-24: 40 mg via SUBCUTANEOUS
  Filled 2009-09-24 (×2): qty 0.4

## 2009-09-24 MED ORDER — SODIUM CHLORIDE 0.9 %, BACTERIOSTATIC INJECTION SOLUTION
INTRAMUSCULAR | Status: AC
Start: 2009-09-24 — End: 2009-09-24
  Filled 2009-09-24: qty 30

## 2009-09-24 MED ORDER — EPHEDRINE SULFATE 50 MG/ML INJECTION SOLUTION
INTRAMUSCULAR | Status: AC
Start: 2009-09-24 — End: 2009-09-24
  Filled 2009-09-24: qty 1

## 2009-09-24 MED ORDER — PROCHLORPERAZINE EDISYLATE 5 MG/ML INJECTION SOLUTION
5.00 mg | Freq: Once | INTRAMUSCULAR | Status: DC | PRN
Start: 2009-09-24 — End: 2009-09-24

## 2009-09-24 SURGICAL SUPPLY — 63 items
BLADE 10 BD RB-BCK CBNSTL SURG TISS STRL LF  DISP (SURGICAL CUTTING SUPPLIES) ×2 IMPLANT
BLADE 15 BD CNV SS SURG TISS STRL LF  DISP (SURGICAL CUTTING SUPPLIES) ×1 IMPLANT
BLADE 15 BD KNF SS SURG TISS S_TRL LF (CUTTING ELEMENTS) ×1
BLANKET 3M BAIR HUG ADLT UPR B ODY 74X24IN PLMR 2 INCS ADH (MISCELLANEOUS PT CARE ITEMS) ×1 IMPLANT
CATH URETH DOVER RBNL 14FR 16IN Ã¡STGR DRAIN EYE RND CLS TIP INT FNL CONN PVC STRL LF  DISP (UROLOGICAL SUPPLIES) ×1 IMPLANT
CATH URETH LBRCTH 16FR FL 2W B_AL LUB SIL DDRGL 5CC STRL LTX (UROLOGICAL SUPPLIES) ×1
CATH URETH LUBRICATH 16FR FOLEY 2W BAL LUB SIL DDRGL 5CC STRL LTX DISP (UROLOGICAL SUPPLIES) ×1 IMPLANT
CONV USE 330322 - NEEDLE HYPO  22GA 1.5IN MONOJECT MAGELLAN SS BVL ORT SLF LEVEL SHEATH SFSHLD STD LL SYRG BLK STRL LF (NEEDLES & SYRINGE SUPPLIES) ×1 IMPLANT
CONV USE ITEM 156524 - ADHESIVE TISSUE EXOFIN 1.0ML_PREMIERPRO EXOFIN (SEALANTS) ×1 IMPLANT
COUNTER 40 CNT MAG DVN BOXLOCKS STRL LF  BLK SHARP 1840 PLASTIC FOAM XL DISP (NEEDLES & SYRINGE SUPPLIES) ×1 IMPLANT
DEVICE SECURE FOLEY CATH ANCH LONG WR TIME ADH PVC FOAM LTX (UROLOGICAL SUPPLIES) ×1 IMPLANT
DONUT EXTREMITY CUSHIONING 31143137 (POSITIONING PRODUCTS) ×3 IMPLANT
DRAPE FL CNTRL PCH DRAIN PORT FILTER SCRN 38X15IN UNDR BUTT CNVRT LF  STRL DISP SURG 27IN (PROTECTIVE PRODUCTS/GARMENTS) ×1 IMPLANT
DUPE USE ITEM 319434 - SUTURE 2-0 SH VICRYL 18IN VIOL_CR BRD 8 STRN COAT ABS (SUTURE/WOUND CLOSURE) IMPLANT
DUPE USE ITEM 319452 - SUTURE 0 CT2 VICRYL 27IN VIOL_BRD COAT ABS (SUTURE/WOUND CLOSURE) IMPLANT
ELECTRODE ESURG BLADE PNCL 10FT VLAB STRL SS DISP BUTTON SWH HEX LOCK CORD HLSTR LF  ACPT 3/32IN STD (CAUTERY SUPPLIES) ×1 IMPLANT
HANDLE RIGID PLASTIC STRL LF  DISP DVN EZ HNDL SURG LIGHT (INSTRUMENTS) ×1
HANDPC SUCT MEDIVAC YANKAUER BLBS TIP CLR STRL LF  DISP (Suction) ×1 IMPLANT
HANDPC SUCT MEDIVAC YANKAUER B_LBS TIP CLR STRL LF DISP (Suction) ×1
HOOK RETRACTR LONESTAR 5MM SS SHARP ELAS STAY 8/PK STRL LF DISP (SUPP) ×1 IMPLANT
HOOK RETRACTR LONESTAR 5MM SS SHARP ELAS STAY STRL LF  DISP (SUPP) ×1
KIT RM TURNOVER CLEANOP CSTM INFCT CONTROL (KITS & TRAYS (DISPOSABLE)) ×1
KIT RM TURNOVER CLEANOP CSTM I_NFCT CONTROL (KITS & TRAYS (DISPOSABLE)) ×1
KIT RM TURNOVER CLEANOP CUSTOM INFCT CONTROL (KITS & TRAYS (DISPOSABLE)) ×1 IMPLANT
MBO USE ITEM 317672 - HANDLE RIGID PLASTIC STRL LF  DISP DVN EZ HNDL SURG LIGHT (SURGICAL INSTRUMENTS) ×1 IMPLANT
METER URN DRAIN BAG 350ML 2.5L STRL LF DISP (UROLOGICAL SUPPLIES) ×1 IMPLANT
NEEDLE HYPO  25GA 1.25IN STD MONOJECT AL SS REG BVL LL HUB UL SHRP ANTICORE RD STRL LF  DISP (NEEDLES & SYRINGE SUPPLIES) ×1 IMPLANT
NEEDLE SPINAL PNK 3.5IN 18GA QUINCKE REG WL POLYPROP QUINCKE TIP STRL LF  DISP (ANETHESIA SUPPLIES) ×1 IMPLANT
NEEDLE SPINAL YW 3.5IN 20GA QUINCKE REG WL POLYPROP STRL LF  DISP (ANETHESIA SUPPLIES) ×1 IMPLANT
NEEDLE SUT 4 .5 CRC 1.248IN .05IN FREE EYE TAPER PNT RCHRD-ALLAN MAYO SS CATGUT TISS CLSR STRL LF (NEEDLES & SYRINGE SUPPLIES) IMPLANT
NEEDLE SUT 4 .5 CRC 1.26IN .05_IN TAPER PNT FREE EYE (NEEDLES & SYRINGE SUPPLIES)
PACK GYN/PERI 9165 9165 (DRAPE/PACKS/SHEETS/OR TOWEL) ×1 IMPLANT
PACKING VAGINAL 6IN X 2IN 10-026 (WOUND CARE SUPPLY) ×1 IMPLANT
PAD ARMBRD BLU (POSITIONING PRODUCTS) ×2 IMPLANT
PEN SURG MRKNG WRITESITE + RLR LBL SET 3X DARKER FORMULATE GNTN VIOL INK STRL LF  CHLRPRP (MISCELLANEOUS PT CARE ITEMS) ×1 IMPLANT
RING RETRACTR LONESTAR NORYL PLASTIC SLF RTN 2 CATH CLIP DISP STRL LF  32.5X18.3CM (SURGICAL INSTRUMENTS) ×1 IMPLANT
RING RETRACTR SM LONESTAR NORYL PLASTIC 2 PEEL PCH SLF RTN FLXB LTWT DISP STRL LF  14.1X14.1CM (SURGICAL INSTRUMENTS) ×1 IMPLANT
SET 81IN REG CLAMP N-PYRG IRRG 10 GTT/ML STR CSCP DEHP BLADDER STRL LF (IV TUBING & ACCESSORIES) ×1 IMPLANT
SLEEVE SCD EXPRESS KNEE REG 5 PER CASE 9529 (EQUIPMENT MINOR) ×1 IMPLANT
SOL IV 0.9% NACL 250ML STRL N-PYRG DRIPLESS ACCESS CONTAINR VIAFLO (SOLUTIONS) IMPLANT
SOLUTION IV NS 250ML AVIVA_UE1322D 30EA/CS (SOLUTIONS)
SPONGE LAP 18X4IN STRL (WOUND CARE SUPPLY) ×1 IMPLANT
SPONGE SURG 4X4IN 16 PLY RADOPQ BAND VISTEC STRL LF  BLU WHT (WOUND CARE SUPPLY) ×2 IMPLANT
SUTURE 0 CT2 VICRYL 18IN VIOL CR BRD 8 STRN COAT ABS (SUTURE/WOUND CLOSURE) IMPLANT
SUTURE 1 CT1 PDS2 27IN VIOL MONOF ABS (SUTURE/WOUND CLOSURE) IMPLANT
SUTURE 1 CT1 PDS2 27IN VIOL MO_NOF ABS (SUTURE/WOUND CLOSURE)
SUTURE 2-0 SH VICRYL 27IN UNDYED BRD COAT ABS (SUTURE/WOUND CLOSURE) IMPLANT
SUTURE 3-0 SH VICRYL 18IN VIOL CR BRD 8 STRN COAT ABS (SUTURE/WOUND CLOSURE) IMPLANT
SUTURE 3-0 SH VICRYL 18IN VIOL_CR BRD 8 STRN COAT ABS (SUTURE/WOUND CLOSURE)
SUTURE 3-0 X-1 VICRYL 27IN UNDYED BRD COAT ABS (SUTURE/WOUND CLOSURE) IMPLANT
SUTURE 4-0 SH-1 VICRYL 27IN VIOL BRD COAT ABS (SUTURE/WOUND CLOSURE) IMPLANT
SUTURE CHR 0 CT1 27IN BRN MONOF ABS (SUTURE/WOUND CLOSURE) IMPLANT
SUTURE CHR 2-0 SH 27IN BRN MONOF ABS (SUTURE/WOUND CLOSURE) IMPLANT
SUTURE CHR 3-0 SH 27IN BRN MONOF ABS (SUTURE/WOUND CLOSURE) IMPLANT
SUTURE CHR GUT 2-0 SH 27IN BRN_MONOF ABS (SUTURE/WOUND CLOSURE)
SUTURE J327H VICRYL BX/36 3-0 27IN VIOLET (SUTURE/WOUND CLOSURE) IMPLANT
SYRINGE BULB PLASTIC 60ML CS/50 67000 (BULBS FOR MEDICAL EQUIPMENT) ×1 IMPLANT
SYRINGE HYPO 60CC SLIP TIP 1186000555 40/BX (Syringes w/ o Needles) IMPLANT
SYRINGE LL 10ML LF  STRL CONTROL CONCEN TIP PRGN FREE DEHP-FR MED DISP (NEEDLES & SYRINGE SUPPLIES) ×1 IMPLANT
TRAY SKIN SCRUB 8IN VNYL COTTON 6 WNG 6 SPONGE STICK 2 TIP APPL DRY STRL LF (KITS & TRAYS (DISPOSABLE)) ×1 IMPLANT
TUBING SUCT CLR 20FT 9/32IN MEDIVAC NCDTV M/M CONN STRL LF (Suction) ×1 IMPLANT
TUBING SUCT CONN 20FT LONG_STRL N720A (Suction) ×1
WATER STRL 2000ML PLASTIC CONTAINR UROMATIC LF (SOLUTIONS) ×1 IMPLANT

## 2009-09-24 NOTE — OR PostOp (Signed)
[facility]                                                     BRIEF OPERATIVE NOTE    Name: [name]   MRN: [id number]   DOB: [birth year]   Date: 09/24/2009    Pre-Operative Diagnosis: Symptomatic vaginal prolapse    Post-Operative Diagnosis: same + pelvic adhesion    Procedure(s)/Description:  1.  Posterior repair,  2.  Lysis of pelvic adhesion.    Findings: see dictated op note.    Attending Surgeon: Liz Beach, MD    Assistant(s): Zettie Pho, MD    Anesthesia Type: general endotracheal    Estimated Blood Loss:  300 mL     Blood Given: none          Fluids Given: crystalloid    Complications:  none    Tubes: foley + vaginal packing    Drains: none.           Specimens/ Cultures: none           Implants: none           Disposition: Op note dictated           Condition: stable      Liz Beach, MD 09/24/2009

## 2009-09-24 NOTE — Progress Notes (Signed)
[facility] [employer] [facility]   [facility]  Department of Obstetrics & Gynecology   Division of Urogynecology & Reconstructive Pelvic Surgery     Inpatient Progress Note    Name: [name]  MRN: [id number]   DOB: 03/30/1956   Date: 09/24/2009     Postoperative Check    S: Vaginal pain.  Controlled with anesthesia.    O: VS - afebrile and stable.     : Good output.     : O2 saturation -  98% on RA.     : Foley draining clear urine.       Assessment: stable    Plan: Routine PO care.  : Start regular diet.        Savonna Birchmeier HM Benay Pillow, MD

## 2009-09-24 NOTE — OR PostOp (Signed)
Pt arrived to 5N PACU from OR# 2 by Anesthesia escort JD Ammons, CRNA - allergies and history reviewed. VSS. Will continue to monitor.

## 2009-09-24 NOTE — Care Management Notes (Addendum)
======================================================================    Patient Name: [name]  DOB: [birth year]  Age: 53   Account Number: [id number]  MR Number: [id number]  ======================================================================  Admission Information  Encounter Type: Inpatient  Patient Type: INPATIENT  Admit Date: 09/24/2009  Admit Time: 05:48  Admit Reason: same  Admitting Phys: Andree Coss  Attending Phys: Andree Coss  Unit: 8 WEST  Room/Bed: 830 / B  ======================================================================  Discharge Information  Actual D/C Date: 09/26/2009  Actual LOS: 2  ADT Disch/Disp: HOME/SELF CARE (Code 01)  CC Disch/Disp: HOME/SELF CARE (Code 01)  ======================================================================  Assessment Information  Status: Completed 09/29/2009  Discharge Manager: Dellis Anes [id number]  Transition Manager: Marc Morgans [phone]  ======================================================================  Problems Identified  ----------------------------------------------------------------------  DC-Patient Assessment and Education; Onset 09/25/2009; Completed 09/29/2009  09/25/2009 13:54 Assessment Form Haynes Bast [id number]) Findings: Met with pt/family/other at bedside and provided education on the role of the Care Mgmt Dept and Clinical Care Coordinator in relation to their hospital stay and discharge planning from [facility] to their next level of care.  DC-Prior Public house manager; Onset 09/25/2009; Completed 09/29/2009  09/25/2009 13:54 Assessment Form Haynes Bast [id number]) Findings: No Prior Programmer, systems; Onset 09/25/2009; Completed 09/29/2009  09/25/2009 13:54 Assessment Form Haynes Bast [id number]) Findings: No Caregiver Issues Noted by Patient and/or Family  DC-Environmental Issues; Onset 09/25/2009; Completed 09/29/2009   09/25/2009 13:54 Assessment Form Haynes Bast [id number]) Findings: No Environmental Issues Noted by Patient and/or Family  DC-Financial Issues; Onset 09/25/2009; Completed 09/29/2009  09/25/2009 13:54 Assessment Form Haynes Bast [id number]) Findings: No Financial Issues Noted by Patient and/or Family  DC-Factors Affecting Discharge; Onset 09/25/2009; Completed 09/29/2009  09/25/2009 13:54 Assessment Form Haynes Bast [id number]) Findings: None Identified  Post DC Needs; Onset 09/25/2009; Completed 09/29/2009  09/25/2009 13:54 Assessment Form Haynes Bast [id number]) Findings: Patient/Family Participation with Planning for Post Discharge Care-Pt/Family in agreement with and have participated in planning for post discharge care ; Goal: Patient will be discharged to a safe and appropriate environment  ======================================================================  Discharge Plan Notes  ----------------------------------------------------------------------  09/25/2009 13:53 Haynes Bast [id number] Late entry for approximate 11am:   53 y/o female admitted for Symptomatic vaginal prolapse   12/1 s/p Posterior repair and Lysis of pelvic adhesion   Met with patient at bedside, discussed role in discharge planning, patient lives alone, states has support and transportation upon discharge, has prescription coverage, scripts filled at [facility] in [place], PCP- Dr.Fitzpatrick at [facility], patient lives in one level with no stairs, No prior HH or DME used in past, No MPOA- declined referral.   D/C plan: Patient will be discharged to home when medically stable. CCC will follow.

## 2009-09-24 NOTE — Nurses Notes (Signed)
Notified Dr Alvie Heidelberg of inability to get PIV.

## 2009-09-24 NOTE — OR PreOp (Signed)
[facility]                                                                                   H&P UPDATE FORM    Name: Elza Varricchio   MRN: [id number]   DOB: [birth year]   Date of admission: 09/24/2009  Date: 09/24/2009    STOP: IF H&P IS GREATER THAN 30 DAYS FROM SURGICAL DAY COMPLETE NEW H&P IS REQUIRED.    Outpatient Pre-Surgical H & P updated the day of the procedure.  1.  H&P: done.      Change in medications: no.      Last Menstrual Period: n/a.      Comments: none.    2.  Patient continues to be appropiate candidate for planned surgical procedure: yes.    Latanya Hemmer HM Benay Pillow, MD

## 2009-09-24 NOTE — OR Nursing (Signed)
Kerin Ransom MS3 present for procedure

## 2009-09-24 NOTE — Nurses Notes (Signed)
Dr Alvie Heidelberg states will wait to take to OR for PIV.

## 2009-09-24 NOTE — Telephone Encounter (Signed)
[facility]   Department of Obstetrics & Gynecology   Division of Urogynecology & Reconstructive Pelvic Surgery    Preoperative Checklist    Name: [name]   MRN: [id number]   DOB: 10-09-1956   Date: 09/24/2009      H&P, labs, and EKG reviewed.  EKG showed ? Old MI.  Cardiac evaluation was negative.  No contraindication for the planned procedures.      [name] HM Benay Pillow, MD

## 2009-09-24 NOTE — Nurses Notes (Signed)
Report received from par nurse. Tele tracked for transfer into 831b.

## 2009-09-25 MED ORDER — BISACODYL 5 MG TABLET,DELAYED RELEASE
10.0000 mg | DELAYED_RELEASE_TABLET | Freq: Once | ORAL | Status: AC
Start: 2009-09-25 — End: 2009-09-25
  Administered 2009-09-25: 10 mg via ORAL
  Filled 2009-09-25: qty 2

## 2009-09-25 MED ORDER — DULOXETINE 60 MG CAPSULE,DELAYED RELEASE
60.0000 mg | DELAYED_RELEASE_CAPSULE | Freq: Every day | ORAL | Status: DC
Start: 2009-09-25 — End: 2009-09-26
  Administered 2009-09-25 – 2009-09-26 (×2): 60 mg via ORAL
  Filled 2009-09-25 (×2): qty 1

## 2009-09-25 MED ORDER — OXYCODONE 5 MG TABLET
5.0000 mg | ORAL_TABLET | ORAL | Status: DC | PRN
Start: 2009-09-25 — End: 2009-09-26
  Administered 2009-09-25: 5 mg via ORAL
  Filled 2009-09-25: qty 1

## 2009-09-25 MED ORDER — DOCUSATE SODIUM 100 MG CAPSULE
100.0000 mg | ORAL_CAPSULE | Freq: Two times a day (BID) | ORAL | Status: DC
Start: 2009-09-25 — End: 2009-09-26
  Administered 2009-09-25: 100 mg via ORAL
  Filled 2009-09-25 (×3): qty 1

## 2009-09-25 NOTE — Nurses Notes (Signed)
Patient continues to void with out difficulty.

## 2009-09-25 NOTE — Nurses Notes (Signed)
Patient voided 125cc of dark yellow urine and bladder scanned post void for zero cc's of urine.

## 2009-09-25 NOTE — Nurses Notes (Signed)
Did a post residual void on patient and had 155 cc before patient urinated and then had 35 cc of post residual after patient urinated. Will continue to monitor.

## 2009-09-25 NOTE — OR Surgeon (Signed)
[facility]                             DEPARTMENT OF OBSTETRICS AND GYNECOLOGY                                     OPERATION SUMMARY    PATIENT NAME: [name], [name] [facility] NWGNFA:[id number]  DATE OF SERVICE:09/24/2009  DATE OF BIRTH: 1956/09/02    PREOPERATIVE DIAGNOSIS:  Symptomatic vaginal prolapse.    POSTOPERATIVE DIAGNOSIS: Same + pelvic adhesions.    NAME OF PROCEDURES:  1.  Posterior repair.  2.  Lysis of pelvic adhesions.    SURGEON:  Liz Beach, MD     ASSISTANT: Zettie Pho, MD     ESTIMATED BLOOD LOSS:  Approximately 300 mL.    FLUIDS GIVEN:  Crystalloid.    CATHETERS AND PACKS: Foley catheter and vaginal packing were placed.    COMPLICATIONS:  None.    INDICATIONS FOR PROCEDURE:  [name] is a 53 year old WMF who has symptomatic vaginal prolapse.  Diagnosis, treatment options and advantages and disadvantages of each were explained.  She felt that her condition is severe enough to warrant surgical correction.  The proposed procedure, possible complications, failure rate and the elective nature of the procedure were explained.    OPERATIVE FINDINGS:  1.  Her posterior vaginal wall had prolapsed past the hymen.  2.  Near the vaginal apex, the lateral pelvic wall was pulled medialy by previous surgery.  3.  There was a T-shaped incision in the rectovaginal fascia.  4.  There were dense adhesions near the vaginal cuff from previous surgery.    DESCRIPTION OF PROCEDURE:  Under satisfactory general anesthesia, the patient was placed in candy cane stirrups.  Her knees and hips were checked to avoid overflexion.  Her hips were also checked to avoid overabduction.  Both her knees and hips were placed in a 90-degree angle.  Examination under anesthesia was then performed.  Findings were described above.     The perineum and vagina were then prepped and draped in the usual sterile manner.  A [place] retractor was placed for exposure.  The posterior vaginal mucosa was opened from the posterior fourchette to the vaginal apex using an inverted T-shaped incision.  Underlying tissue was dissected away bilaterally to the pelvic sidewall.    Near the vaginal apex, the lateral sidewalls were drawn medially from previous surgery. Dense adhesions were encountered during the dissection. They were individually lysed using Metzenbaum scissors.  Numerous bleeding were encountered, which were individually suture ligated or coagulated using Bovie.  Dissecting the adhesions in the vaginal apex area took approximately an hour.    A rectal examination revealed an inverted T-shaped incision.  The transverse incision was repaired using 4 interrupted stitches of 2-0 Vicryl. The midline incision was repaired using 7 interrupted stitches of 2-0 Vicryl.  Rectal examination did not reveal any injury or stitch penetration and that the rectocele had been repaired.  A small amount of vaginal mucosa was trimmed off. The rest of the vaginal mucosa was closed using a running stitch of 3-0 Vicryl. The perineal incision was closed using interrupted stitches of 4-0 Vicryl.  Examination revealed a loose 2 finger introitus with the vaginal apex in the posterior pelvis.  A repeat rectal examination did not reveal any injury or stitch  penetration.  A Foley catheter was placed.  Vaginal packing was also placed.  Patient was awoken from anesthesia and taken to the recovery room in good condition.    EBL was approximately 300 mL.  Needle, blade, and sponge counts were reported as correct.      Liz Beach, MD  Associate Professor  [facility] Department of Obstetrics and Gynecology    [id number]; D: 09/24/2009 11:40:11; T: 09/25/2009 00:23:33

## 2009-09-25 NOTE — Progress Notes (Addendum)
[facility]  UroGynecology   Inpatient Progress Note    Barbara, Gardner, 53 y.o. female  Date of Admission:  09/24/2009  Date of Service: 09/25/2009  Date of Birth:  1956/02/04    Hospital Day:  LOS: 1 day     Subjective: Pt w/o complaints overnight.  Tolerating PO, pain controlled.     Vital Signs:  Filed Vitals:    09/24/2009  7:00 PM 09/24/2009 11:15 PM 09/25/2009 12:04 AM 09/25/2009  4:00 AM   BP: 91/52  91/50 102/59   Pulse: 84  94 71   Temp: 37 C (98.6 F)  37.1 C (98.8 F) 37 C (98.6 F)   Resp: 18  18 18    SpO2:  97%         Today's Physical Exam:  Gen: NAD  Abd: soft, nttp, nd  Ext: no le edema, no calf ttp  Vag Packing Removed    Assessment/ Plan:   POD #1 s/p Posterior Repair  -cont PO diet  -d/c PCA  -start PO analgesia  -start PO home med  -remove foley  -ambulate    Zettie Pho, MD 09/25/2009, 7:08 AM

## 2009-09-25 NOTE — Progress Notes (Signed)
[facility] [employer] [facility]   [facility]  Department of Obstetrics & Gynecology   Division of Urogynecology & Reconstructive Pelvic Surgery     Inpatient Progress Note    Name: [name]  MRN: [id number]   DOB: Aug 08, 1956   Date: 09/25/2009     POD #1 PM round    S: Lower abdominal pain and cramps.  Feel constipated.    O: VS - afebrile and stable.     : Excellent output.     : Abdomen - soft and NT.        Assessment: probable constipation.    Plan: Dulcolax tablets x 2 and colace.      Elihu Milstein HM Benay Pillow, MD

## 2009-09-25 NOTE — Nurses Notes (Signed)
Dr Benay Pillow in to see patient and said that she would stay overnight due to constipation. Orderd obtained.

## 2009-09-25 NOTE — Nurses Notes (Signed)
0315-Status    Pt resting in bed, denies any pain or discomfort at this time.  Pain controlled with Dilaudid PCA.  Vaginal packing intact. Foley patent & secured, draining clear yellow urine. Complete assessment per flowsheet via LPN.  Will continue to monitor with LPN. ~Tory Septer O'Dell, RN~

## 2009-09-25 NOTE — Nurses Notes (Signed)
Removed foley catheter. Catheter intact. Patient tolerated well. Dayshift Nurse- Valeria Batman will take over to monitor Input and Output. Griffey Nicasio, LPN

## 2009-09-26 MED ORDER — OXYCODONE 5 MG TABLET
5.00 mg | ORAL_TABLET | ORAL | Status: DC | PRN
Start: 2009-09-26 — End: 2009-12-03

## 2009-09-26 MED ORDER — DOCUSATE SODIUM 100 MG CAPSULE
100.00 mg | ORAL_CAPSULE | Freq: Two times a day (BID) | ORAL | Status: DC
Start: 2009-09-26 — End: 2010-05-25

## 2009-09-26 MED ORDER — MAGNESIUM HYDROXIDE 400 MG/5 ML ORAL SUSPENSION
30.0000 mL | Freq: Once | ORAL | Status: AC
Start: 2009-09-26 — End: 2009-09-26
  Administered 2009-09-26: 2400 mg via ORAL
  Filled 2009-09-26: qty 30

## 2009-09-26 NOTE — Nurses Notes (Signed)
Pt d/c to home pt walking to the front family at front

## 2009-09-26 NOTE — Discharge Summary (Signed)
[facility]                              DEPARTMENT OF OBSTETRICS AND GYNECOLOGY                                     DISCHARGE SUMMARY    PATIENT NAME: [name], [name] [facility] ZOXWRU:[id number]  DATE OF BIRTH: [birth year]    ADMISSION DATE:09/24/2009  DISCHARGE DATE:09/26/2009    DISCHARGE DIAGNOSES:     1. Symptomatic vaginal prolapse.   2. Pelvic adhesions.     MAJOR PROCEDURES:  1. Posterior repair.   2. Lysis of pelvic adhesions.     REASON FOR HOSPITALIZATION AND HOSPITAL COURSE:  Ms. Fiorentino is a 53 year old white female with symptomatic vaginal prolapse who admitted for surgical repair.  Postoperatively, she had an uneventful recovery course and was discharged home on postoperative day #2.  At that time, she was ambulating, voiding, and taking po well.  Her bowel function has also returned to normal.  Examination showed the patient to be afebrile throughout her hospital course with stable vital signs and excellent urine output.  Her abdomen was soft and nontender.      DISCHARGE INSTRUCTIONS:   1. Regular diet.   2. Modified activity.   3. Follow up in 6 weeks.   4. Call with any problems.   5. Printed discharge instructions were given and reviewed.   6. Roxicodone 2 mg, #30, was given for pain.   7. Colace 100 mg, #24, was given to take bid.   8. Resume all medications.      Liz Beach, MD  Associate Professor  [facility] Department of Obstetrics and Gynecology    [id number]; D: 09/26/2009 06:35:49; T: 09/26/2009 14:08:16    cc: Jerry Caras MD      Shirleen Schirmer

## 2009-09-26 NOTE — Nurses Notes (Signed)
Removed both peripheral IVs from left arm, both catheter tips intact.

## 2009-09-26 NOTE — Nurses Notes (Signed)
Pt voided without difficulty throughout the night. No bowel movement was noted by the patient, pt states "the urge is there, but I do not want to create too much pressure around the area." Will continue to monitor pt.

## 2009-09-26 NOTE — Progress Notes (Signed)
[facility] [employer]   [facility]  Department of Obstetrics & Gynecology   Division of Urogynecology & Reconstructive Pelvic Surgery     Inpatient Progress Note    Name: [name]  MRN: [id number]   DOB: Dec 20, 1955   Date: 09/26/2009     POD #2    S: Ambulating, voiding, and taking PO well.    : Feels lot better after passing flatus.    O: VS - afebrile and stable.     : Excellent output.     : Abdomen - soft and NT.        Assessment: stable.  : GERD.  : Depression.      Plan: Discharge home.  : Regular diet.  : Modify activity.  : Follow-up 6 weeks.  : Call with any problems.  : Printed discharge instructions given and reviewed.  : Roxicodone 5-mg, #30, given x pain.  : Resume all meds.  :  Liz Beach, MD

## 2009-09-26 NOTE — Nurses Notes (Signed)
I have reviewed and agree with LPN's assessment.  Will monitor.

## 2009-09-26 NOTE — Nurses Notes (Signed)
Received orders to discharge pt.  Discharge instructions, including follow-ups & prescriptions, reviewed with pt; pt verbalized understanding.  Pt has prescriptions given to her by Dr. Benay Pillow.  Calling family for a ride home.

## 2009-10-09 ENCOUNTER — Ambulatory Visit (INDEPENDENT_AMBULATORY_CARE_PROVIDER_SITE_OTHER): Payer: MEDICAID | Admitting: Psychiatry

## 2009-10-09 MED ORDER — DULOXETINE 60 MG CAPSULE,DELAYED RELEASE
60.0000 mg | DELAYED_RELEASE_CAPSULE | Freq: Every day | ORAL | Status: DC
Start: 2009-10-09 — End: 2009-12-05

## 2009-10-09 NOTE — Progress Notes (Signed)
[facility]  [address]  [place], [place] [address]  Dept: [phone]  Loc: [phone]    Time in: 9:15 AM    Time out: 9:30 AM      CHIEF COMPLAINT:  Follow up.      SUBJECTIVE:   [name] is a 53 y.o. female from [place], [place] who comes for a follow up. She was last evaluated on 09/15/2009 for MDD. At that time, she was continued on Cymbalta 60 mg daily. She has been taking her medication as prescribed and denied having any side effects.    On her last appointment the patient reported that she was having difficulties with her sleep, in addition to being schedule for rectocele repair on 09/24/09. Today, the patient reported that the surgery was uneventful and that there were no complications, except for the staff having difficulties waking her up after the anesthesia effect passed. She has been sleeping "a lot" since the surgery, which she attributes to the medications that she is receiving for pain control. Her mood is"good" and she is looking forward for the Holidays.       OBJECTIVE:  Orientation: x3  Appearance: Patient looks stated age.  Hygiene: Good.  Grooming: Good.  Psychomotor: No agitation or retardation.  Speech: Normal rate, volume and tone.  Eye contact: Good.  Mood: euthymic.  Affect: congruent.  Thought process: Linear.  Thought content: Negative for delusions, paranoia, obsessions.  Perceptual disturbances: No hallucinations.  Suicidal ideation: Denied.  Homicidal ideation: Denied.  Memory:  grossly intact.  Insight: Good  Judgment: Good.      MEDICATION LIST:  Current outpatient prescriptions prior to encounter   Medication Sig Dispense Refill    docusate sodium (COLACE) 100 mg Cap take 1 Cap by mouth Twice daily.   28  0    oxycodone (ROXICODONE) 5 mg Tab take 1 Tab by mouth Every 4 hours as needed for Pain.   30  0    Acetaminophen (TYLENOL EXTRA STRENGTH) 500 mg Tab take 2 Tabs by mouth Once a day.         DISCONTD: duloxetine (CYMBALTA) 60 mg CpDR take 1  Cap by mouth Once a day.   1 month supply  1    esomeprazole magnesium (NEXIUM) 40 mg CpDR take 1 Cap by mouth Daily before Breakfast.   90  3    Diclofenac Sodium (VOLTAREN) 75 mg TbEC take 1 Tab by mouth Twice daily.   60  6    ASTELIN 137 mcg SprA 2 Sprays by Nasal route Twice daily. Use in each nostril as directed             ASSESSMENT/PLAN:  Encounter Diagnoses   Code Name Primary? 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 I discussed with her possible sleep aids, like, medications - (vistaril or trazodone), to be used in the future if she had a relapse of symptoms.  She was advised to call if she had concerns, or experienced side effects from the medications.  Follow up in 6 weeks or before if needed.      Marletta Lor, MD

## 2009-11-14 ENCOUNTER — Ambulatory Visit (HOSPITAL_BASED_OUTPATIENT_CLINIC_OR_DEPARTMENT_OTHER): Payer: Self-pay | Admitting: Hospital-Specialty Hospital

## 2009-11-17 ENCOUNTER — Encounter (HOSPITAL_COMMUNITY): Payer: MEDICAID | Admitting: Psychiatry

## 2009-12-03 ENCOUNTER — Ambulatory Visit (INDEPENDENT_AMBULATORY_CARE_PROVIDER_SITE_OTHER): Payer: MEDICAID | Admitting: Family Practice-BA

## 2009-12-03 NOTE — Progress Notes (Signed)
Subjective:     Patient ID:  [name] is an 54 y.o. female   Chief Complaint:  Chief Complaint   Patient presents with    F/U     severe neck pain f/u [facility] needs MRI ordered         HPI Comments: Patient with h/o arthritis and depression presents with c/o "severe" neck pain beginning suddenly 3 days ago. She first noticed it while she was sitting on the computer. Pain is on cervical vertebrae, described as a burning sensation with deep dull ache with intermittent stabbing pains that radiate into left shoulder and down entire left arm through hand and fingers. Has noticed some increased swelling of her left hand. Pain worse with "anything." pain lasts 10 minutes, happens 20-30 x/day. Worse with straightening fingers. Improved with stretching chin to chest. She went to urgent care two nights ago, got x-rays showing "degenerative changes and a possible disc problem" per patient report. She was given Ultram and Robaxin which have helped her sleep. Not noticing weakness, no tingling or numbness, just ache.    No history of anything like this before. 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    No tobacco, not driving while on these medications that make her feel "groggy."      Review of Systems   Constitutional: Negative.         Feels "groggy" attributes to the medication   Skin: Negative.    HENT: Negative.    Eyes: Positive for blurred vision. Negative for double vision and eye pain.   Cardiovascular: Negative.    Respiratory: Negative.    Gastrointestinal: Negative.    Genitourinary: Negative.    Musculoskeletal: Positive for neck pain.   Neurological: Negative.  Negative for dizziness.   Psychiatric: Negative for depression.  Does not have insomnia.      Objective:  BP 136/90   Pulse 86   Temp 36.6 C (97.8 F)   Ht 1.676 m (5\' 6" )   Wt 93.895 kg (207 lb)   LMP Hysterectomy   Physical Exam   Constitutional: She appears well-developed and well-nourished.    Mildly distressed due to pain with protective posturing of neck and left arm.    Eyes: Extraocular motions are normal. Pupils are equal, round, and reactive to light.   Cardiovascular: Normal rate, regular rhythm, normal heart sounds and intact distal pulses.    Pulm:  Effort normal, breath sounds normal.    There are no wheezes present.    Ortho/Musculoskeletal:           Back Exam        Cervical back: She exhibits decreased range of motion, tenderness, bony tenderness, pain and spasm. She exhibits no swelling, no edema  and  no deformity.        Patient with antalgic gait, decreased ROM with looking over right shoulder and cervical hyperextension. Decreased ROM of left shoulder abduction. +TTP C4 through C7, +spasm of musculature to left of cervical vertebrae. Holds her left hand closed and close to her body. Phalens test negative.   Neurological: She is alert. She has normal reflexes. No cranial nerve deficit.   Skin: She is not diaphoretic.     .    Assessment & Plan:     723.1B Neck pain with radiculopathy: patient agrees to take Diclofenac, which she has at home, 75 mg BID scheduled for two weeks, will continue Robaxin and Ultram PRN (has plenty  from urgent care), will get MRI SPINE CERVICAL WO CONTRAST, AMB CONSULT/REFERRAL SPINE CENTER, AMB CONSULT/REFERRAL PHYS THERAPY- EXTERNAL          RTC 4 weeks or sooner if needed or develops any warning signs as discussed    Hubert Azure, MD  [facility] FAMILY MEDICINE [facility]  FAMILY MEDICINE-[facility]  [address] [facility]  [place], [place] [address]  [phone]

## 2009-12-03 NOTE — Progress Notes (Signed)
I saw and evaluated the patient. I reviewed the resident's note. I agree with the findings and plan of care as documented in the resident's note. Any exceptions/additions are noted.      Samil Mecham Shea Analisse Randle, MD 12/03/2009, 2:24 PM

## 2009-12-04 ENCOUNTER — Ambulatory Visit (INDEPENDENT_AMBULATORY_CARE_PROVIDER_SITE_OTHER): Payer: MEDICAID | Admitting: Hospital-Specialty Hospital

## 2009-12-04 NOTE — Progress Notes (Signed)
[facility]   Department of Obstetrics & Gynecology   Division of Urogynecology & Reconstructive Pelvic Surgery     Outpatient Progress Note    Name: [name]  MRN: [id number]   DOB: May 15, 1956   Date: 12/04/2009       Here for postoperative check.  Doing very well and has no complaints.  All pelvic symptoms have resolved.    PE - healthy female.   BP-  118/78  Speculum - vagina is very well supported.    Assessment: doing well.    Plan: Precaution to prevent prolapse discussed.  : Follow-up PRN.  : Call with any problem.       Rosalin Buster HM Benay Pillow, MD

## 2009-12-04 NOTE — Progress Notes (Signed)
Do not send a copy of this letter to the referring provider.   Copy on departmental stationary sent.     December 04, 2009    Jerry Caras, MD   Department of [facility]   [facility]   [place], [place] [address]     RE: Esly Selvage   MRN: [id number]   DOB: Mar 05, 1956     Dear Dr. Luan Pulling:           I saw Ms. Vidovich for postoperative check this afternoon. She underwent posterior repair and lysis of pelvic adhesion on 09/24/2009. Ms. Spaid is doing very well. All her pelvic symptoms have resolved. Examination showed that her vagina is very well supported. I have asked her to contact your office for follow-up and will be happy to see her again as needed.          Thank you again for asking me to see this very pleasant lady. If I can be of any further assistance with urogynecologic problems, please do not hesitate to call.       Respectfully,         Liz Beach, MD

## 2009-12-05 ENCOUNTER — Ambulatory Visit (INDEPENDENT_AMBULATORY_CARE_PROVIDER_SITE_OTHER): Payer: MEDICAID | Admitting: Psychiatry

## 2009-12-05 MED ORDER — DULOXETINE 60 MG CAPSULE,DELAYED RELEASE
60.0000 mg | DELAYED_RELEASE_CAPSULE | Freq: Every day | ORAL | Status: DC
Start: 2009-12-05 — End: 2010-02-11

## 2009-12-05 NOTE — Progress Notes (Signed)
[facility]  BEHAVIORAL MEDICINE  [address]  [place], [place] [address]  Dept: [phone]  Loc: [phone]    Time in: 3:00 PM    Time out: 3:19 PM      CHIEF COMPLAINT:  Medication refill.      SUBJECTIVE:   [name] is a 54 y.o. female from [place], [place] who comes for a follow up. She was last evaluated on 10/09/09 for MDD. At that time, she was continued on Cymbalta 60 mg daily. She has been taking her medication as prescribed and denied having any side effects.    Patient stated that she is doing well on current medication. She denied any symptoms related to depression, anxiety, mania or psychosis. She has been experiencing pain in her neck radiating to her left arm and swelling of the left hand. She had cervical x-rays done and they found a mass. She is waiting to be scheduled for an MRI of the neck, which is going to be followed by a surgical evaluation. She is optimistic about the outcome. She is going to [place] on 12/14/2009 to visit her sister and get her car. She enrolled in an online college course and is doing well.      OBJECTIVE:  Orientation: x3  Appearance: Patient looks stated age.  Hygiene: Good.  Grooming: Good.  Psychomotor: No agitation or retardation.  Speech: Normal rate, volume and tone.  Eye contact: Good.  Mood: euthymic.  Affect: congruent.  Thought process: Linear.  Thought content: Negative for delusions, paranoia, obsessions.  Perceptual disturbances: No hallucinations.  Suicidal ideation: Denied.  Homicidal ideation: Denied.  Memory:  grossly intact.  Insight: Good  Judgment: Good.      MEDICATION LIST:  Current outpatient prescriptions prior to encounter   Medication Sig Dispense Refill    methocarbamol (ROBAXIN) 500 mg Tab take by mouth. 1-2 tab by mouth 3 times daily for spasm        tramadol-acetaminophen (ULTRACET) 37.5-325 mg Tab take by mouth. 1-2 tabs by mouth 3 times daily for pain         DISCONTD: duloxetine (CYMBALTA) 60 mg CpDR take 1 Cap by mouth  Once a day.   1 month supply  1    docusate sodium (COLACE) 100 mg Cap take 1 Cap by mouth Twice daily.   28  0    Acetaminophen (TYLENOL EXTRA STRENGTH) 500 mg Tab take 2 Tabs by mouth Once a day.         esomeprazole magnesium (NEXIUM) 40 mg CpDR take 1 Cap by mouth Daily before Breakfast.   90  3    ASTELIN 137 mcg SprA 2 Sprays by Nasal route Twice daily. Use in each nostril as directed             ASSESSMENT/PLAN:  Encounter Diagnoses   Code Name Primary? 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 Monitor for sleep disturbances. She was advised to call if she had concerns, or experienced side effects from the medications. Follow up in 2 months, per patient request.       Marletta Lor, MD

## 2009-12-10 NOTE — Progress Notes (Signed)
I saw and evaluated the patient. I reviewed the resident's note. I agree with the findings and plan of care as documented in the resident's note. Any exceptions/additions are noted.      Samaiyah Howes H Yonah Tangeman, DO 12/10/2009, 3:23 PM

## 2010-01-01 ENCOUNTER — Ambulatory Visit (INDEPENDENT_AMBULATORY_CARE_PROVIDER_SITE_OTHER): Payer: MEDICAID | Admitting: Orthopaedic Surgery of the Spine

## 2010-01-05 ENCOUNTER — Ambulatory Visit (INDEPENDENT_AMBULATORY_CARE_PROVIDER_SITE_OTHER): Payer: MEDICAID | Admitting: NURSE PRACTITIONER

## 2010-01-05 ENCOUNTER — Encounter (INDEPENDENT_AMBULATORY_CARE_PROVIDER_SITE_OTHER): Payer: Self-pay | Admitting: NURSE PRACTITIONER

## 2010-01-05 ENCOUNTER — Ambulatory Visit
Admission: RE | Admit: 2010-01-05 | Discharge: 2010-01-05 | Disposition: A | Discharge status: Home / Self Care | Payer: MEDICAID | Attending: NURSE PRACTITIONER | Admitting: NURSE PRACTITIONER

## 2010-01-05 DIAGNOSIS — R0789 Other chest pain: Secondary | ICD-10-CM | POA: Insufficient documentation

## 2010-01-05 MED ORDER — TRAMADOL 37.5 MG-ACETAMINOPHEN 325 MG TABLET
2.00 | ORAL_TABLET | Freq: Four times a day (QID) | ORAL | Status: DC | PRN
Start: 2010-01-05 — End: 2010-01-12

## 2010-01-05 MED ORDER — METHOCARBAMOL 500 MG TABLET
500.00 mg | ORAL_TABLET | Freq: Three times a day (TID) | ORAL | Status: DC
Start: 2010-01-05 — End: 2010-02-26

## 2010-01-05 NOTE — Progress Notes (Signed)
Subjective:     Patient ID:  [name] is an 54 y.o. female   Chief Complaint:  Chief Complaint   Patient presents with   . Muscle Pain     possible pulled muscle         HPI Comments: Patient presents today with pain directly below left shoulder area radiating down back and burrowing forward causing chest pain and SOB. States pain is worse when she takes a deep breath in.States yesterday she lifted a heavy box, but pain did not develop until this morning when she woke up. Denies numbess and tingling to bilateral upper and lower extremities.Denies loss of bowel or bladder habits.  Has tried Advil with minimal relief.   Muscle Pain    There has been chest pain and shortness of breath.  There has been no abdominal pain and no headaches.       Review of Systems   Constitutional: Negative for fever, chills, malaise/fatigue and weakness.   HENT: Negative for headaches, hearing loss and ear pain.    Eyes: Negative for blurred vision and eye pain.   Cardiovascular: Positive for chest pain. Negative for palpitations.        Occasional chest pain and SOB since this morning. Does not relieve with rest, advil, or position changes.    Respiratory: Negative for cough.  Is experiencing shortness of breath.   Gastrointestinal: Negative for nausea, vomiting and abdominal pain.   Musculoskeletal: Positive for myalgias, neck pain, back pain and joint pain.        Developed neck pain one month ago. Was seen in emergency room and prescribed Robaxin and Ultracet. Stated medication significantly helped pain.    Previous history of Sachroilitis per patient. To be seen in [facility] on 3/24.    Neurological: Negative for dizziness, tingling and tremors.     Objective:   Physical Exam   Constitutional: She is oriented. She appears well-developed and well-nourished.   HENT:   Head: Normocephalic.   Eyes: Pupils are equal, round, and reactive to light.   Neck: Normal range of motion. Neck supple.         Mild pain to midline neck area while extending head to chest.    Cardiovascular: Normal rate, regular rhythm and normal heart sounds.    Pulm:         Noticably SOB. Pulseox 99%.    Pain on palpitation to 5th intercostal space, midclavicular line.  Effort normal, breath sounds normal.  Observations:  no respiratory distress.    There are no rales present.    There are no wheezes present.  The chest exhibits tenderness.    Abdomen:   Bowel sounds are normal. She exhibits no mass. Soft. No tenderness. She has no rebound and no guarding.   Ortho/Musculoskeletal:   She exhibits tenderness. She exhibits no edema.        Decrease range of motion with abduction of upper extremities.     Pain on palpation just below left shoulder, midclavicular line.    Neurological: She is alert and oriented. She has normal reflexes. No cranial nerve deficit. Coordination normal.   Skin: Skin is warm.   Psychiatric: She has a normal mood and affect. Her behavior is normal. Judgment and thought content normal.     .    Assessment & Plan:     786.65F Chest pain  Plan: [facility] ECG, XR CHEST PA AND LATERAL,         TRAMADOL-ACETAMINOPHEN  37.5 MG-325 MG TAB  Reviewed cardiac chest pain s/sx with patient and instructed her to RTC or ER immedi if those type of sx develop.  Pt verbalizes understanding with the plan. Will make additional treatment plan based on CXR findings.              848.9D Muscle strain  Plan: Ice and heat as needed for discomfort. Robaxin 500mg  QID     723.1B Neck pain  Plan: METHOCARBAMOL 500 MG TAB,         TRAMADOL-ACETAMINOPHEN 37.5 MG-325 MG TAB        Heat and Ice as needed for discomfort. Keep ortho appt 3/24.      729.5BU Left arm pain  Plan: METHOCARBAMOL 500 MG TAB,         TRAMADOL-ACETAMINOPHEN 37.5 MG-325 MG TAB    F/u prn.     Thereasa Parkin, CFNP

## 2010-01-12 ENCOUNTER — Encounter (INDEPENDENT_AMBULATORY_CARE_PROVIDER_SITE_OTHER): Payer: MEDICAID | Admitting: Otolaryngology

## 2010-01-12 ENCOUNTER — Ambulatory Visit
Admission: RE | Admit: 2010-01-12 | Discharge: 2010-01-12 | Disposition: A | Discharge status: Home / Self Care | Payer: MEDICAID | Attending: Family Medicine | Admitting: Family Medicine

## 2010-01-12 ENCOUNTER — Ambulatory Visit (INDEPENDENT_AMBULATORY_CARE_PROVIDER_SITE_OTHER): Payer: MEDICAID | Admitting: Family Medicine

## 2010-01-12 VITALS — BP 130/86 | HR 90 | Temp 97.5°F | Ht 65.75 in | Wt 205.0 lb

## 2010-01-12 DIAGNOSIS — M542 Cervicalgia: Secondary | ICD-10-CM

## 2010-01-12 DIAGNOSIS — Z5181 Encounter for therapeutic drug level monitoring: Secondary | ICD-10-CM | POA: Insufficient documentation

## 2010-01-12 DIAGNOSIS — L0293 Carbuncle, unspecified: Secondary | ICD-10-CM

## 2010-01-12 DIAGNOSIS — IMO0002 Reserved for concepts with insufficient information to code with codable children: Secondary | ICD-10-CM

## 2010-01-12 LAB — CBC/DIFF
BASOPHILS: 1 % (ref 0–1)
BASOS ABS: 0.053 10*3/uL (ref 0.0–0.2)
EOS ABS: 0.107 10*3/uL (ref 0.1–0.3)
EOSINOPHIL: 1 % (ref 1–6)
HCT: 42 % (ref 33.5–45.2)
HGB: 14.2 g/dL (ref 11.5–15.2)
LYMPHOCYTES: 32 % (ref 20–45)
LYMPHS ABS: 2.74 10*3/uL (ref 1.0–4.8)
MCH: 27.9 pg (ref 27.4–33.0)
MCHC: 33.9 g/dL (ref 31.6–35.5)
MCV: 82.4 fL (ref 82.0–99.0)
MONOCYTES: 7 % (ref 4–13)
MONOS ABS: 0.587 10*3/uL (ref 0.1–0.9)
MPV: 6.4 FL — ABNORMAL LOW (ref 7.4–10.4)
NRBC'S: 0 /100WBC
PLATELET COUNT: 470 10*3/uL — ABNORMAL HIGH (ref 140–450)
PMN ABS: 4.98 10*3/uL (ref 1.5–7.7)
PMN'S: 59 % (ref 40–75)
RBC: 5.1 MIL/uL — ABNORMAL HIGH (ref 3.84–5.04)
RDW: 13.5 % (ref 10.2–14.0)
WBC: 8.5 10*3/uL (ref 3.5–11.0)

## 2010-01-12 LAB — AST (SGOT): AST (SGOT): 29 U/L (ref 8–41)

## 2010-01-12 LAB — ELECTROLYTES
ANION GAP: 8 mmol/L (ref 5–16)
CARBON DIOXIDE: 24 mmol/L (ref 22–32)
CHLORIDE: 108 mmol/L (ref 96–111)
POTASSIUM: 4.1 mmol/L (ref 3.5–5.1)
SODIUM: 140 mmol/L (ref 136–145)

## 2010-01-12 LAB — BUN
BUN/CREAT RATIO: 11 (ref 6–22)
BUN: 10 mg/dL (ref 6–20)

## 2010-01-12 LAB — CREATININE
CREATININE: 0.9 mg/dL (ref 0.49–1.10)
ESTIMATED GLOMERULAR FILTRATION RATE: >59 mL/min/{1.73_m2} (ref >59)

## 2010-01-12 LAB — ALT (SGPT): ALT (SGPT): 41 U/L (ref 7–45)

## 2010-01-12 MED ORDER — DICLOFENAC SODIUM 75 MG TABLET,DELAYED RELEASE
75.00 mg | DELAYED_RELEASE_TABLET | Freq: Two times a day (BID) | ORAL | Status: DC
Start: 2010-01-12 — End: 2011-03-11

## 2010-01-12 NOTE — Progress Notes (Signed)
SUBJECTIVE:  [name] is a 54 y.o. female is here today for Knee Pain    And several other concerns    She complains of pain in her neck and left trap area for a week or so.  .  The pain onset was acute, not associated with any activity, and is continuous.  Pain is described as aching.  Severity: moderate.  Daily Activity is Slightly limited due to pain.  The patient has tried Ultracet and Robaxin given by Urgent Care ([facility]).  Xrays done and showed some abnl finding, MRI advised but insurance denied and now has appt with [facility] to evaluate soon.      Her left knee improved some with PT, brace given by Sports Med but now R knee is painful after sitting for hours on a plane trip  No ankle edema.    Still takes Cymbalta for depression and didn't know there was an interaction between Ultracet and this.  Taking Ultracet one qid.  Feels flushed and sweaty after taking it but helps pain some.    Saw Dr Benay Pillow and had bladder surgery which helped bladder control    Has skin breakout on R breast, has previous similar lesion on L breast that resolved on it's own    I have reviewed the patient's medical, surgical, family and social history in detail and updated the computerized patient record.  Current outpatient prescriptions   Medication Sig   . Diclofenac Sodium (VOLTAREN) 75 mg TbEC take 1 Tab by mouth Twice daily.    . methocarbamol (ROBAXIN) 500 mg Tab take 1 Tab by mouth Three times a day. 1-2 tab by mouth 3 times daily for spasm    . tramadol-acetaminophen (ULTRACET) 37.5-325 mg Tab take 2 Tabs by mouth Every 6 hours as needed 1-2 tabs by mouth 3 times daily for pain    . duloxetine (CYMBALTA) 60 mg CpDR take 1 Cap by mouth Once a day.    . docusate sodium (COLACE) 100 mg Cap take 1 Cap by mouth Twice daily.    Marland Kitchen esomeprazole magnesium (NEXIUM) 40 mg CpDR take 1 Cap by mouth Daily before Breakfast.    . ASTELIN 137 mcg SprA 2 Sprays by Nasal route Twice daily. Use in each nostril as directed              ROS: Pertinent items are noted in HPI.  OBJECTIVE:   Vitals: BP 130/86  Pulse 90  Temp 36.4 C (97.5 F)  Ht 1.67 m (5' 5.75")  Wt 92.987 kg (205 lb)  LMP Hysterectomy   Appearance:in no apparent distress  Exam:   Neck: supple, symmetrical, trachea midline, no adenopathy, thyroid: not enlarged, symmetric, no tenderness/mass/nodules, mild tenderness in midline neck over C5-C6 but no deformity, mild L trapezius tenderness  Back: symmetric, no curvature. ROM normal. No CVA tenderness., mild diffuse lumbar tenderness in paraspinal muscles  Lungs: clear to auscultation bilaterally, normal percussion bilaterally  Heart: regular rate and rhythm, S1, S2 normal, no murmur, click, rub or gallop  Extremities: extremities normal, atraumatic, no cyanosis or edema, calf circumference same bilaterally, no knee effusion  Has 5 mm furuncle on her right breast near the areola at 7 oclock without cellulitis or mass    ASSESSMENT:   PLAN:  Encounter Diagnoses   Code Name Primary? Qualifier   . 723.1BM Neck pain, acute  Stop Ultracet   Ice packs for 15-20 mins 3-4 times daily are helpful to reduce pain and inflammation.  See Spine clinic  Can use Voltaren and Robaxin   Yes    . 715.96AC Osteoarthritis, knee      Plan: AMB CONSULT/REFERRAL ORTHOPAEDICS/SPORTS MEDICINE      . V58.83E Therapeutic drug monitoring for diclofenac      Plan: CREATININE, BUN, ELECTROLYTES, AST (SGOT), ALT (SGPT), CBC/DIFF today     . 680.9N Furuncle  Warm compresses   Call if worsens and may need further tx         Return office visit in 4 months Health Maintenance exam then.   Advised to call back directly if there are further questions, or if these symptoms fail to improve as anticipated or worsen.  Theola Sequin, MD  [facility] FAMILY MEDICINE [facility]  FAMILY MEDICINE-[facility]  [address]  [place], [place] [address]  [phone]

## 2010-01-15 ENCOUNTER — Ambulatory Visit
Admission: RE | Admit: 2010-01-15 | Discharge: 2010-01-15 | Disposition: A | Discharge status: Home / Self Care | Payer: MEDICAID | Attending: Orthopaedic Surgery of the Spine | Admitting: Orthopaedic Surgery of the Spine

## 2010-01-15 ENCOUNTER — Ambulatory Visit (INDEPENDENT_AMBULATORY_CARE_PROVIDER_SITE_OTHER): Payer: MEDICAID | Admitting: Orthopaedic Surgery of the Spine

## 2010-01-15 DIAGNOSIS — M79609 Pain in unspecified limb: Secondary | ICD-10-CM

## 2010-01-15 DIAGNOSIS — M542 Cervicalgia: Secondary | ICD-10-CM

## 2010-01-15 NOTE — Progress Notes (Signed)
To be dictated.  The patient was seen as a shared visit with the co-signing faculty.

## 2010-01-16 ENCOUNTER — Ambulatory Visit (INDEPENDENT_AMBULATORY_CARE_PROVIDER_SITE_OTHER): Payer: MEDICAID | Admitting: ORTHOPEDIC, SPORTS MEDICINE

## 2010-01-16 ENCOUNTER — Ambulatory Visit (INDEPENDENT_AMBULATORY_CARE_PROVIDER_SITE_OTHER): Payer: MEDICAID

## 2010-01-16 ENCOUNTER — Encounter (INDEPENDENT_AMBULATORY_CARE_PROVIDER_SITE_OTHER): Payer: Self-pay | Admitting: ORTHOPEDIC, SPORTS MEDICINE

## 2010-01-16 NOTE — Progress Notes (Signed)
I saw and evaluated the patient. I reviewed the resident's note. I agree with the findings and plan of care as documented in the resident's note. Any exceptions/additions are noted.      John France, MD 01/16/2010, 4:45 PM

## 2010-01-16 NOTE — H&P (Signed)
[facility] Department of Orthopaedics  [address]  [place], [place] [address]      HISTORY AND PHYSICAL    PATIENT NAME: [name], [name]  CHART NUMBER: [id number]  DATE OF BIRTH: 1956/10/08  DATE OF SERVICE: 01/15/2010    CHIEF COMPLAINT: [name] comes in today with the chief complaint of neck pain as well as radiating arm pain bilaterally.     HISTORY OF PRESENT ILLNESS: [name] is a pleasant 54 year old female who has been having sharp severe midline neck pain as well as radiating arm pain since 11/30/2009. This is not specific injury; however, she notes that this date she can specifically remember having quite a bit of arm pain as well as neck pain. In the past, she really has not had very much, if any neck pain. She certainly was not having any arm pain. She states that among her pain complaints, she is also complaining of decreased strength, especially grip bilaterally of the upper extremities. She is also complaining of a tingling in the right hand, however, she attributes that to carpal tunnel, which she has had for years now. She has been trying medications, which include Ultracet, however, due to the interference with Cymbalta that she had to discontinue. She has been taking Robaxin as well as Voltaren with some relief. She has also tried heat at home. She has not tried any formal physical therapy, injections or chiropractic treatments. She states that any activity such as overhead activities or repetitive motions with her arms causes a sharp increase in her bilateral arm pain. She does state thought that pretty much she has a constant level arm pain, it just depends on the activities and how bad it is. She is also complaining of a constant neck pain and rate it about a 6 or 7 on a consistent basis. She states really about the only relief that she gets is lying down, but at times if she lies for too long she will get quite a bit of increase in her pain as well. She has no problems with buttoning buttons or issues with dexterity. She has no fumbling of her hands. She is not complaining of any gait or balance issues. She has no bowel or bladder complaints. She has some numbness in her left foot and left radiating leg pain, but that is all the paresthesia type complaints that she has. She had no previous spine surgeries.     PAST MEDICAL HISTORY: She has high cholesterol.    PAST SURGICAL HISTORY: She has had ulnar nerve transposition left-sided, partial hysterectomy, appendectomy, oophorectomy, and tonsillectomy.    SOCIAL HISTORY: She does not use tobacco products.    FAMILY HISTORY: Positive for high blood pressure and diabetes.    MEDICATIONS: Please see list in [facility].    ALLERGIES: Codeine, rash and itching; Mobic, vomiting.    REVIEW OF SYSTEMS: Pertinent positives and negatives of the review of systems are dictated as above. Remaining review of systems is found to be negative.    PHYSICAL EXAMINATION: Vitals: Blood pressure is 152/100, pulse 77, weight 205 pounds, height 5 feet 5 inches. The patient appears to be a healthy 54 year old female who is alert, oriented x3. She is calm and cooperative during the exam. She does not appear to be in any acute distress. HEAD: Normocephalic, atraumatic. Neck: Supple, no lymphadenopathy. Respirations: Nonlabored without wheeze. Abdomen: Soft, nontender. Musculoskeletal: The patient ambulates with no dysfunction. She does ambulate with a slight antalgic limp, favoring the  left lower extremity. On manual manipulation of the lower extremities, she has 5/5 strength. Her reflexes of the upper extremities seem to be fairly brisk, more on the +3 side. There is no finger flexion with brachioradialis. I did get a positive Hoffmann test bilaterally. She did have fair range of motion of her cervical spine. No tenderness with palpation. She had fairly good range of motion of her bilateral shoulders. Of the lower extremities, her reflexes were +3 as well. No clonus.    IMAGING: We did review the x-rays, which showed a very mild spondylosis but overall alignment is maintained.     ASSESSMENT AND PLAN: At this point, the patient needs to have an MRI of the cervical spine. We feel this would be very adequate in further evaluating the bilateral arm pain for we feel this is more of a nerve issue. We are going to hold off on physical therapy and injections until we can see the MRI. We will have her follow up in about two to three weeks once the MRI is completed.      Vance Gather, PA-C  [place] Department of Orthopaedics    John Guinea-Bissau, MD  Associate Professor  [facility] Department of Orthopaedics    QI/ON/[id number]; D: 01/15/2010 16:18:23; T: 01/15/2010 17:36:36

## 2010-01-16 NOTE — Progress Notes (Signed)
See dictated note.

## 2010-01-16 NOTE — Procedures (Signed)
Left knee injection   The risks and benefits of the injection were explained to the patient and the decision was made to proceed with the injection.  The skin was prepped with betadine and the injection was carried out using 2 cc of 1% lidocaine, 2 cc of 0.5% marcaine, and Celestone  12mg .  This was injected into the left knee.  The skin was cleaned with alcohol and a sterile bandage was placed over the injection site. The patient tolerated the injection well.    Right pes anserine bursa injection  The risks and benefits of the injection were explained to the patient and the decision was made to proceed with the injection.  The skin was prepped with betadine and the injection was carried out using 2 cc of 1% lidocaine and Celestone  6mg .  This was injected into the right knee pes bursa.  The skin was cleaned with alcohol and a sterile bandage was placed over the injection site. The patient tolerated the injection well.

## 2010-01-19 ENCOUNTER — Ambulatory Visit (INDEPENDENT_AMBULATORY_CARE_PROVIDER_SITE_OTHER): Payer: MEDICAID

## 2010-01-19 ENCOUNTER — Encounter (INDEPENDENT_AMBULATORY_CARE_PROVIDER_SITE_OTHER): Payer: Self-pay | Admitting: Otolaryngology

## 2010-01-19 ENCOUNTER — Encounter (INDEPENDENT_AMBULATORY_CARE_PROVIDER_SITE_OTHER): Payer: Self-pay

## 2010-01-19 ENCOUNTER — Ambulatory Visit (INDEPENDENT_AMBULATORY_CARE_PROVIDER_SITE_OTHER): Payer: MEDICAID | Admitting: Otolaryngology

## 2010-01-19 VITALS — BP 160/89 | HR 65 | Temp 97.3°F | Resp 18 | Wt 208.3 lb

## 2010-01-19 MED ORDER — ERYTHROMYCIN 5 MG/GRAM (0.5 %) EYE OINTMENT
TOPICAL_OINTMENT | OPHTHALMIC | Status: DC
Start: 2010-01-19 — End: 2010-02-26

## 2010-01-19 MED ORDER — ESOMEPRAZOLE MAGNESIUM 40 MG CAPSULE,DELAYED RELEASE
40.00 mg | DELAYED_RELEASE_CAPSULE | Freq: Two times a day (BID) | ORAL | Status: DC
Start: 2010-01-19 — End: 2010-05-25

## 2010-01-19 NOTE — Procedures (Signed)
Procedure:  Fiberoptic Trans-nasal Laryngoscopy  Operator: Armeni  Asst: Oliverio  Anesthesia:  Topical  Findings: Visualization of the hypopharynx/larynx with mirror not adequate for examination and fiberoptic examination performed.  After the nasal cavity was anesthetized/decongested with topical pontocaine and neosynephrine, flexible laryngoscope was passed.     Nasopharynx had normal mucosa and no lesions of the nasopharyngeal walls.  The Eustachian tube orifices were normal.     Hypopharynx:  No lesions of the epiglottis, posterior pharyngeal walls or piriform mucosa.  There is no pooling of secretions.    Larynx:   The vocal cords are mobile and adduct to midline bilaterally.  There are no lesions.   There is good abduction with sniff.  The airway is patent. There is some post-aretynoid edema.    Dr. Ardean Larsen was present and did the procedure

## 2010-01-19 NOTE — Progress Notes (Signed)
PATIENT NAME:[name]  MRN:  [id number]  DOB:  [birth year]  DATE: 01/19/2010    Subjective:   [name] is a 54 y.o.female who is followed in our clinic for allergic rhinitis, laryngopharyngeal reflux and multinodular thyroid goiter. She currently uses nasal saline and astelin nasal spray for the allergies and feels that her symptoms are well controlled. For her reflux, she is on Nexium 40 mg daily. She feels that her reflux symptoms have increased in the past several months. Her voice is more hoarse and her globus sensation has increased. She otherwise denies weight loss. She denies any other associated symptoms. She has multinodular goiter of the thyroid and had an ultrasound six months ago which was stable. She will have another ultrasound in six months.    Objective:   BP 151/92   Pulse 61   Temp(Src) 36.7 C (98 F) (Tympanic)   Ht 1.651 m (5\' 5" )   Wt 94.802 kg (209 lb)   BMI 34.78 kg/m2  General Appearance: pleasant, cooperative, no distress  Eyes: Right Eye erythema  Head and Face: Normocephalic  External Ears:normal pinnae shape and position  External Auditory Canal - Left: Patent without inflammation.  External Auditory Canal - Right: Patent without inflammation.  Tympanic Membrane - Left: intact, translucent, midposition, middle ear aerated  Tympanic Membrane - Right: intact, translucent, midposition, middle ear aerated  Nose: external pyramid midline, septum midline,  mucosa normal,  no purulence,  polyps, or crusts   Oral Cavity/Oropharynx: No mucosal lesions, masses, or pharyngeal asymmetry.  Nasopharynx: patent.  Hypopharynx/Larynx: See fiberoptic exam.  Neck:: no cervical adenopathy, no palpable thyroid or salivary gland masses  Thyroid: multinodular goiter  Skin: Skin warm and dry  Neurologic: grossly normal  Psych-Alert and Oriented times three    Procedure:  Fiberoptic Trans-nasal Laryngoscopy  Operator: Armeni  Asst: Oliverio  Anesthesia:  Topical  Findings: Visualization of the  hypopharynx/larynx with mirror not adequate for examination and fiberoptic examination performed.  After the nasal cavity was anesthetized/decongested with topical pontocaine and neosynephrine, flexible laryngoscope was passed.     Nasopharynx had normal mucosa and no lesions of the nasopharyngeal walls.  The Eustachian tube orifices were normal.     Hypopharynx:  No lesions of the epiglottis, posterior pharyngeal walls or piriform mucosa.  There is no pooling of secretions.    Larynx:   The vocal cords are mobile and adduct to midline bilaterally.  There are no lesions.   There is good abduction with sniff.  The airway is patent. There is some post-aretynoid edema.    Dr. Ardean Larsen was present and did the procedure            Assessment:   Rhintiis-stable  Reflux  Multinodular Goiter  Right eye erythema  Plan:   She will follow up in six months time with repeat ultrasound of the thyroid  We will refer her to ophto as soon as possible for her eye issure or she will go to urgent care  She will start nexium 40 mg BID for her reflux and be referred to GI for consult as her reflux is worsening      Molli Hazard A. Cinda Quest, MD  Resident  [place] [facility] of Otolaryngology  Charmaine Downs, MD

## 2010-01-19 NOTE — Progress Notes (Signed)
History of Present Illness: [name] is a 54 y.o. female who presents to the Urgent Care today of Eye Problem     Patient presents with 2 days of right eye pain, redness, and swelling. Unsure whether or not she got cleaning product into her right eye. Initially began as mild swelling and redness along the right lower, outer eyelid. Today woke up with pain, redness, and swelling along the right lower eyelid and redness of the right eye. Increase tear production. No discharge. Mildly blurry vision. Wears glasses to read but no contact lenses. No fevers or chills.        I reviewed and confirmed the patient's past medical history taken by the nurse or medical assistant including the following:    Past Medical History:      Past Medical History   Diagnosis Date   . depression    . Multinodular goiter    . Headache      Migraines   . Arthritis    . GERD (gastroesophageal reflux disease)    . Vaginal prolapse    . Rheumatic fever          Past Surgical History:      Past Surgical History   Procedure Date   . Pb upper gi endoscopy,biopsy 12/19/07     patulous GE junction zone, erythema, nonerosive GERD   . Hx ankle fracture tx 2007     left distal fibula, casted   . Hx wrist fracture tx 2007     FOOSH injury   . Hx partial hysterectomy    . Colonoscopy 01/06/09     COLONOSCOPY performed by Augustin Schooling F at [facility] OR ENDO   . Hx cystocele repair 09/24/09   . Hx cholecystectomy    . Hx adenoidectomy    . Hx tonsillectomy          Allergies:      Allergies   Allergen Reactions   . Codeine Itching     RASH   . Meloxicam Nausea/ Vomiting         Medications:        Outpatient prescriptions prior to encounter:  esomeprazole magnesium (NEXIUM) 40 mg CpDR take 1 Cap by mouth Twice a day before meals.   Diclofenac Sodium (VOLTAREN) 75 mg TbEC take 1 Tab by mouth Twice daily.    methocarbamol (ROBAXIN) 500 mg Tab take 1 Tab by mouth Three times a day. 1-2 tab by mouth 3 times daily for spasm     duloxetine (CYMBALTA) 60 mg CpDR take 1 Cap by mouth Once a day.    esomeprazole magnesium (NEXIUM) 40 mg CpDR take 1 Cap by mouth Daily before Breakfast.    ASTELIN 137 mcg SprA 2 Sprays by Nasal route Twice daily. Use in each nostril as directed   docusate sodium (COLACE) 100 mg Cap take 1 Cap by mouth Twice daily.          Social History:      History   Social History   . Marital Status: Divorced     Spouse Name: N/A     Number of Children: N/A   . Years of Education: N/A   Occupational History   . 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 Smoking status: Former Smoker -- 2.0 packs/day for 40 years     Types: Cigarettes     Quit date: 01/19/2005   . Smokeless tobacco: Not on file  Comment: quit 2007   . Alcohol Use: No   . Drug Use: No   . Sexually Active: Not on file   Other Topics Concern   . Not on file   Social History Narrative   . No narrative on file         Family History:     Family History   Problem Relation Age of Onset   . Congestive Heart Failure Father    . Heart Attack Sister      76   . Hypertension Brother    . Hypertension Sister    . Hypertension Sister    . Kidney Disease Sister      ESRD   . Stroke Paternal Grandmother    . Diabetes Mother    . Thyroid Disease Sister          Review of Systems:    General: No fevers  ENT:  No sore throat or ear pain  Eyes: Right eye redness, blurry vision, increase tear production, right eyelid swelling  Pulmonary: No cough  Cardiovascular: No chest pain  Neurologic: No headaches  Skin: Redness along the right lower eyelid    Physical Exam:  Vital signs:   Filed Vitals:    01/19/10 1041   BP: 160/89   Pulse: 65   Temp: 36.3 C (97.3 F)   Resp: 18   Weight: 94.5 kg (208 lb 5.4 oz)   SpO2: 97%       General:  No acute distress.   Cardiac:  Regular, Rate, and Rythm.  No murmur, rubs or gallops.   Lungs:  Clear to auscultation bilaterally. No wheezes or crackles.  ENT: Moist membranes, neck is supple   Eyes:  PERRLA, EOMI.  right outer thigh is mildly injected. Right lower eyelid is erythematous, tender, and warm. Point tenderness is located along the right outer, lower eyelid.  On slit lamp examination no foreign body or increased uptake    Course: Pt discharged in stable condition.    Point-of-care testing: I reviewed the following results as noted.         Radiography: None    Electrocardiography: None    Differential diagnosis: Blepharitis versus conjunctivitis    Assessment: 1. Blepharitis of right eye (373.35M)        Plan:  Orders Placed This Encounter   . erythromycin (ROMYCIN) 5 mg/gram (0.5 %) Oint            Plan was discussed and patient verbalized understanding.  If symptoms are worsening or not improving the patient should return to the Urgent Care for further evaluation.    Sid Falcon, MD 01/19/2010, 11:13 AM

## 2010-01-19 NOTE — Patient Instructions (Addendum)
Orders Placed This Encounter   . erythromycin (ROMYCIN) 5 mg/gram (0.5 %) Oint      ________________________________________________________________________  Short Term Disability and Family Medical Leave Act  [facility] does NOT provide assistance with any disability applications.  If you feel your medical condition requires you to be on disability, you will need to follow up with  Your primary care physician or a specialist.  We apologize for any inconvenience.    For Medication Prescribed by [facility]:  As an Urgent Care facility, our clinic does NOT offer prescription refills over the telephone.    If you need more of the medication one of our medical providers prescribed, you will  Either need to be re-evaluated by [place] or see your primary care physician.    ________________________________________________________________________      It is very important that we have a phone number that is the single best way to contact you in the event that we become aware of important clinical information or concerns after your discharge.  If the phone number you provided at registration is NOT this number you should inform staff and registration prior to leaving.      Your treatment and evaluation today was focused on identifying and treating potentially emergent conditions based on your presenting signs, symptoms, and history.  The resulting initial clinical impression and treatment plan is not intended to be definitive or a substitute for a full physical examination and evaluation by your primary care provider.  If your symptoms persist, worsen, or you develop any new or concerning symptoms, you need to be evaluated.       If you received x-rays during your visit, be aware that the final and formal interpretation of those films by a radiologist may occur after your discharge.  If there is a significant discrepancy identified after your discharge, we will contact you at th telephone number provided at registration.      If you received a pelvic exam, you may have cultures pending for sexually transmitted diseases.  Positive cultures are reported to the [facility] Department of Health as required by state law.  You should be contacted if you cultures are positive.  We will not contact you if they are negative.  You may contact the Health Information Management Office of [place] to get a copy of your results.  You did NOT receive a PAP smear (the screening test for cervical).  This specific test for women is best performed by your gynecologist or primary care provider when indicated.      If you are over 73 year old, we cannot discuss your personal health information with a parent, spouse, family member, or anyone else without your express consent.  This does not include those who have legitimate access to your records and information to assist in your care under the provisions of HIPAA [facility] Portability and Accountability Act) law, or those to whom you have previously given express written consent to do so, such a legal guardian or Power of [place].      You may have received medication that may cause you to feel drowsy and/or light headed for several hours.  You may even experience some amnesia of your stay.  You should avoid operating a motor vehicle or performing any activity requiring complete alertness or coordination until you feel fully awake (approximately 24-48 hours).  Avoid alcoholic beverages.  You may also have a dry mouth for several hours.  This is a normal  side effect and will disappear as the effects of the medication wear off.       Instructions discussed with patient upon discharge by clinical staff with all questions answered.  Please call [facility] [phone]) if any further questions.  Go immediately to the emergency department if any concern or worsening symptoms.        Sid Falcon, MD 01/19/2010, 11:12 AM    Blepharitis     Your exam shows you have blepharitis. This an inflammation of the eyelids.  This problem causes the eyelids to become red, swollen or scaly with an eye discharge that often glues the eyelids together overnight.  The eyes are often red with excess tearing, burning or itching.  The most common cause of blepharitis is a bacterial infection in the oil glands along the lid margin. Other common causes include drug and cosmetic allergies and eye irritation from too much rubbing.     Treatment includes cleaning the eyelashes and lid margins with warm water and a little baby shampoo daily for 1-2 weeks until the discharge clears up. Apply warm compresses for 10 minutes several times daily to help clean up the oil and scaly areas, then massage the eyelids gently, rubbing the upper lid downward and the lower lid upward.  Antibiotic ointment and drops are often prescribed and may be needed on a long term basis if you get blepharitis frequently.     Avoid using any eye make-up until the lids are normal. If you get blepharitis often, you should avoid makeup forever.     SEEK IMMEDIATE MEDICAL CARE IF:   You develop increasing pain   You develop blurring or loss of vision   You are getting worse or having no response to treatment     Document Released: 10/11/2005    [facility] Patient Information 2008 [place], [place].

## 2010-01-19 NOTE — Progress Notes (Signed)
Visual Acuity: Uncorrected: Left eye 20/30. Right eye 20/25.Barbara Paris Deandrea Vanpelt, LPN 0/45/4098, 11:91 AM

## 2010-01-20 ENCOUNTER — Inpatient Hospital Stay (HOSPITAL_COMMUNITY): Payer: MEDICAID | Admitting: FAMILY PRACTICE

## 2010-01-20 ENCOUNTER — Ambulatory Visit (INDEPENDENT_AMBULATORY_CARE_PROVIDER_SITE_OTHER): Payer: MEDICAID | Admitting: Family Medicine

## 2010-01-20 ENCOUNTER — Inpatient Hospital Stay
Admission: AD | Admit: 2010-01-20 | Discharge: 2010-01-24 | DRG: 125 | Disposition: A | Discharge status: Home / Self Care | Payer: MEDICAID | Attending: Family Medicine | Admitting: Family Medicine

## 2010-01-20 ENCOUNTER — Encounter (HOSPITAL_COMMUNITY): Payer: Self-pay

## 2010-01-20 VITALS — BP 130/84 | HR 80 | Temp 97.6°F | Ht 66.0 in | Wt 207.0 lb

## 2010-01-20 DIAGNOSIS — F3342 Major depressive disorder, recurrent, in full remission: Secondary | ICD-10-CM | POA: Diagnosis present

## 2010-01-20 DIAGNOSIS — Z87891 Personal history of nicotine dependence: Secondary | ICD-10-CM

## 2010-01-20 DIAGNOSIS — F319 Bipolar disorder, unspecified: Secondary | ICD-10-CM | POA: Diagnosis present

## 2010-01-20 DIAGNOSIS — H00039 Abscess of eyelid unspecified eye, unspecified eyelid: Principal | ICD-10-CM | POA: Diagnosis present

## 2010-01-20 DIAGNOSIS — L03213 Periorbital cellulitis: Secondary | ICD-10-CM | POA: Insufficient documentation

## 2010-01-20 DIAGNOSIS — M129 Arthropathy, unspecified: Secondary | ICD-10-CM | POA: Diagnosis present

## 2010-01-20 DIAGNOSIS — K219 Gastro-esophageal reflux disease without esophagitis: Secondary | ICD-10-CM | POA: Diagnosis present

## 2010-01-20 DIAGNOSIS — F339 Major depressive disorder, recurrent, unspecified: Secondary | ICD-10-CM | POA: Insufficient documentation

## 2010-01-20 LAB — CBC/DIFF
BASOPHILS: 1 % (ref 0–1)
BASOS ABS: 0.12 THOU/uL (ref 0.0–0.2)
EOS ABS: 0.067 THOU/uL — ABNORMAL LOW (ref 0.1–0.3)
EOSINOPHIL: 1 % (ref 1–6)
HCT: 41.5 % (ref 33.5–45.2)
HGB: 13.9 g/dL (ref 11.5–15.2)
LYMPHOCYTES: 33 % (ref 20–45)
LYMPHS ABS: 2.98 THOU/uL (ref 1.0–4.8)
MCH: 27.9 pg (ref 27.4–33.0)
MCHC: 33.6 g/dL (ref 31.6–35.5)
MCV: 83 fL (ref 82.0–99.0)
MONOCYTES: 6 % (ref 4–13)
MONOS ABS: 0.514 THOU/uL (ref 0.1–0.9)
NRBC'S: 0 /100{WBCs}
PLATELET COUNT: 404 THOU/uL (ref 140–450)
PMN ABS: 5.3 THOU/uL (ref 1.5–7.7)
PMN'S: 59 % (ref 40–75)
RDW: 13.7 % (ref 10.2–14.0)
WBC: 9 THOU/uL (ref 3.5–11.0)

## 2010-01-20 LAB — BASIC METABOLIC PANEL
ANION GAP: 10 mmol/L (ref 5–16)
BUN/CREAT RATIO: 15 (ref 6–22)
BUN: 12 mg/dL (ref 6–20)
CALCIUM: 9 mg/dL (ref 8.5–10.4)
CARBON DIOXIDE: 25 mmol/L (ref 22–32)
CHLORIDE: 103 mmol/L (ref 96–111)
CREATININE: 0.81 mg/dL (ref 0.49–1.10)
ESTIMATED GLOMERULAR FILTRATION RATE: >59 ml/min/1.73m2 (ref >59)
GLUCOSE,NONFAST: 121 mg/dL (ref 65–139)
POTASSIUM: 3.6 mmol/L (ref 3.5–5.1)
SODIUM: 138 mmol/L (ref 136–145)

## 2010-01-20 LAB — C-REACTIVE PROTEIN(CRP),INFLAMMATION: C-REACTIVE PROTEIN (CRP),INFLAMMATION: 0.64 mg/dL (ref <0.800)

## 2010-01-20 MED ORDER — DULOXETINE 60 MG CAPSULE,DELAYED RELEASE
60.0000 mg | DELAYED_RELEASE_CAPSULE | Freq: Every day | ORAL | Status: DC
Start: 2010-01-21 — End: 2010-01-25
  Administered 2010-01-21 – 2010-01-24 (×4): 60 mg via ORAL
  Filled 2010-01-20 (×4): qty 1

## 2010-01-20 MED ORDER — OXYCODONE-ACETAMINOPHEN 5 MG-325 MG TABLET
1.0000 | ORAL_TABLET | Freq: Four times a day (QID) | ORAL | Status: DC | PRN
Start: 2010-01-20 — End: 2010-01-25
  Administered 2010-01-20 – 2010-01-22 (×8): 1 via ORAL
  Filled 2010-01-20 (×8): qty 1

## 2010-01-20 MED ORDER — DICLOFENAC SODIUM 75 MG TABLET,DELAYED RELEASE
75.00 mg | DELAYED_RELEASE_TABLET | Freq: Two times a day (BID) | ORAL | Status: DC
Start: 2010-01-20 — End: 2010-01-25
  Administered 2010-01-20: 75 mg via ORAL
  Administered 2010-01-21 (×2): 0 mg via ORAL
  Administered 2010-01-22 – 2010-01-23 (×4): 75 mg via ORAL
  Administered 2010-01-24: 0 mg via ORAL

## 2010-01-20 MED ORDER — SODIUM CHLORIDE 0.9 % INTRAVENOUS SOLUTION
3.0000 g | Freq: Four times a day (QID) | INTRAVENOUS | Status: DC
Start: 2010-01-20 — End: 2010-01-22
  Administered 2010-01-20 – 2010-01-22 (×7): 3000 mg via INTRAVENOUS
  Filled 2010-01-20 (×12): qty 3000

## 2010-01-20 MED ORDER — ESOMEPRAZOLE MAGNESIUM 40 MG CAPSULE,DELAYED RELEASE
40.00 mg | DELAYED_RELEASE_CAPSULE | Freq: Two times a day (BID) | ORAL | Status: DC
Start: 2010-01-21 — End: 2010-01-25
  Administered 2010-01-21 – 2010-01-24 (×7): 40 mg via ORAL
  Filled 2010-01-20 (×7): qty 1

## 2010-01-20 MED ORDER — ACETAMINOPHEN 325 MG TABLET
650.0000 mg | ORAL_TABLET | ORAL | Status: DC | PRN
Start: 2010-01-20 — End: 2010-01-20
  Administered 2010-01-20: 650 mg via ORAL
  Filled 2010-01-20: qty 2

## 2010-01-20 MED ORDER — METHOCARBAMOL 500 MG TABLET
500.00 mg | ORAL_TABLET | Freq: Three times a day (TID) | ORAL | Status: DC
Start: 2010-01-20 — End: 2010-01-25
  Administered 2010-01-20 – 2010-01-24 (×11): 500 mg via ORAL
  Filled 2010-01-20 (×13): qty 1

## 2010-01-20 MED ORDER — DOCUSATE SODIUM 100 MG CAPSULE
100.00 mg | ORAL_CAPSULE | Freq: Two times a day (BID) | ORAL | Status: DC
Start: 2010-01-20 — End: 2010-01-25
  Administered 2010-01-20 – 2010-01-24 (×8): 100 mg via ORAL
  Filled 2010-01-20 (×9): qty 1

## 2010-01-20 MED ORDER — ONDANSETRON HCL (PF) 4 MG/2 ML INJECTION SOLUTION
4.0000 mg | Freq: Four times a day (QID) | INTRAMUSCULAR | Status: DC | PRN
Start: 2010-01-20 — End: 2010-01-25

## 2010-01-20 NOTE — Nurses Notes (Signed)
54 year old female admitted from Dept of Family Medicine clinic via wheelchair accompanied by SA. Patient came to clinic for swelling , pain and redness of right eye. Alert and oriented x 3 . Lungs clear, respirations even, non-labored O2 SAT 100 % on RA.Allergic to Codeine and Meloxicam.  Rating pain in eye at 8/10.Will medicate for pain as ordered.

## 2010-01-20 NOTE — Nurses Notes (Signed)
Pt admitted for periorbital cellulitis of R eye. R eye; redness and swollen. Pt rates pain 7 on numeric pain scale. Pain med given by nurse on pervious shift. Pt decline further pain interventions at this moment. Pt is alert and oriented. States normal vision. Pupil are reactive and equal. Assessment per flowsheet. BP 143/68   Pulse 80   Temp 36.8 C (98.2 F)   Resp 20   Ht 1.651 m (5\' 5" )   Wt 93.895 kg (207 lb)   BMI 34.45 kg/m2   SpO2 100% Pt resting in bed. Will continue to monitor.

## 2010-01-20 NOTE — H&P (Addendum)
[name]   Family Medicine   History and Physical    Akita, Maxim, 54 y.o. female  Date of Admission:  (Not on file)  Date of Birth:  01/13/1956    Information Obtained from: patient  Chief Complaint:  Eye redness and swelling    HPI: Kanesha Cadle is a 54 y.o., White female who presents with a 3 day history of redness, swelling, and pain around her right eye.  She reports noticing a small painful area of redness on the lateral border of her right eye Sunday morning that progressively got larger.  On Monday she was unable to see ophthalmalogy clinic because it was booked so she went to urgent care where a flouroscene test was negative for any scrapes to the eye.  She was prescribed erythromycin ointment and has been applying it to her eye.  She presented to the family medicine clinic today to be reevaluated, as she feels her eye is getting worse despite erythromycin ointment.  She reports extreme swelling of the periorbital tissues in the mornings and severe pain.  She reports some blurry vision in that eye.  She denies fevers, but has had chills.  She denies any N/V/D, sick contacts, prior episodes, CP, SOB, abdominal pain.      She denies tobacco use, quit 5 years ago, denies alcohol and drug use.  She is on disability for bipolar disorder for past 10 years.     Family history significant for mom with HTN, sister HTN, DM.      Past Medical History   Diagnosis Date   . depression    . Multinodular goiter    . Headache      Migraines   . Arthritis    . GERD (gastroesophageal reflux disease)    . Vaginal prolapse    . Rheumatic fever        Past Surgical History   Procedure Date   . Pb upper gi endoscopy,biopsy 12/19/07     patulous GE junction zone, erythema, nonerosive GERD   . Hx ankle fracture tx 2007     left distal fibula, casted   . Hx wrist fracture tx 2007     FOOSH injury   . Hx partial hysterectomy    . Colonoscopy 01/06/09      COLONOSCOPY performed by KANDEEL, AHMED F at [facility] OR ENDO   . Hx cystocele repair 09/24/09   . Hx cholecystectomy    . Hx adenoidectomy    . Hx tonsillectomy        No prescriptions prior to admission       Allergies   Allergen Reactions   . Codeine Itching     RASH   . Meloxicam Nausea/ Vomiting       History   Substance Use Topics   . Smoking status: Former Smoker -- 2.0 packs/day for 40 years     Types: Cigarettes     Quit date: 01/19/2005   . Smokeless tobacco: Not on file    Comment: quit 2007   . Alcohol Use: No       Family History   Problem Relation Age of Onset   . Congestive Heart Failure Father    . Heart Attack Sister      53    . Hypertension Brother    . Hypertension Sister    . Hypertension Sister    . Kidney Disease Sister      ESRD   . Stroke Paternal Grandmother    .  Diabetes Mother    . Thyroid Disease Sister          ROS: Positive for chills, blurry vision.  Negative for abdominal pain, fevers, dysuria, blood in urine or stool, N/V/D, abdominal pain, chest pain, SOB.    EXAM:     General: appears in good health  Eyes: right eye has injected sclera, not photophobic during exam, EOMI, redness and swelling present greater around the lateral and inferior border/eyelid of the right eye, but extending superiorly into her midforehead, and down into her cheek.    HENT:Head atraumatic and normocephalic  Neck: No JVD or thyromegaly or lymphadenopathy  Lungs: Clear to auscultation bilaterally.   Cardiovascular: regular rate and rhythm  Abdomen: Soft, non-tender, Bowel sounds normal  Extremities: No cyanosis or edema  Skin: Skin warm and dry  Neurologic: Grossly normal      LABS:  Lab Results for Last 24 Hours:  No results found for any visits on 01/20/10 (from the past 24 hour(s)).    Radiology Results: N/A     IF APPROPRIATE, HAS PATIENT RECEIVED PNEUMOVAX No  IF APPROPRIATE, HAS PATIENT RECEIVED  INFLUENZA No    DNR Status:  Full code        ASSESSMENT/PLAN:  Active Hospital Problems   (*Primary Problem)    Diagnoses   . Preseptal cellulitis       54 yo female with preseptal cellulitis    Preseptal cellulitis  - blood cultures  - CBC/Diff, CRP, BMP  - unasyn  - Patient afebrile  - reexamine in the morning  - will consider ophthalmalogy consult in AM    GERD  - continue home medications (Nexium 40BID)    Depression  - Continue Cymbalta 60mg  daily    Arthritis  - continue home voltaren, and methocarbamol      DVT Risk Factors:  Age greater than 40  DVT/PE Prophylaxis: Not indicated    Geralyn Flash, MD 01/20/2010, 4:15 PM          Late entry for 01/20/10. I saw and examined the patient.  I reviewed the resident's note.  I agree with the findings and plan of care as documented in the resident's note.  Any exceptions/additions are edited/noted.    Brendolyn Patty, MD 01/21/2010, 11:30 AM

## 2010-01-21 LAB — BASIC METABOLIC PANEL
ANION GAP: 7 mmol/L (ref 5–16)
BUN/CREAT RATIO: 20 (ref 6–22)
BUN: 17 mg/dL (ref 6–20)
CALCIUM: 8.7 mg/dL (ref 8.5–10.4)
CARBON DIOXIDE: 24 mmol/L (ref 22–32)
CHLORIDE: 106 mmol/L (ref 96–111)
CREATININE: 0.86 mg/dL (ref 0.49–1.10)
ESTIMATED GLOMERULAR FILTRATION RATE: >59 ml/min/1.73m2 (ref >59)
GLUCOSE,NONFAST: 99 mg/dL (ref 65–139)
POTASSIUM: 4.4 mmol/L (ref 3.5–5.1)
SODIUM: 137 mmol/L (ref 136–145)

## 2010-01-21 LAB — PERFORM POC WHOLE BLOOD GLUCOSE: GLUCOSE, POINT OF CARE: 98 mg/dL (ref 70–105)

## 2010-01-21 LAB — H & H
HCT: 39.8 % (ref 33.5–45.2)
HGB: 13.6 g/dL (ref 11.5–15.2)

## 2010-01-21 NOTE — Progress Notes (Addendum)
[facility]                                       FAMILY MEDICINE PROGRESS NOTE    [name], [name], 54 y.o. female  Date of Admission:  01/20/2010  Date of service: 01/21/2010  Date of Birth:  [birth year]    Hospital Day:  LOS: 1 day     Subjective: Patient reports some improvement in her right eye, but it continues to be painful.  She reports some improvement in her blurry vision as well.  She denies fevers, chills, or chest pain.      Vital Signs:  Systolic (24hrs), Avg:141 mmHg, Min:106 mmHg, Max:189 mmHg    Diastolic (24hrs), Avg:69 mmHg, Min:58 mmHg, Max:94 mmHg    Temp  Avg: 36.6 C (97.9 F)  Min: 36.5 C (97.7 F)  Max: 36.8 C (98.2 F)  Pulse  Avg: 66.8   Min: 58   Max: 80   Resp  Avg: 19.6   Min: 18   Max: 20   SpO2  Avg: 97.8 %  Min: 96 %  Max: 100 %  Pain Score: 8    I/O:  I/O last 24 hours:    Intake/Output Summary (Last 24 hours) at 01/21/10 0849  Last data filed at 01/21/10 0739   Gross per 24 hour   Intake    920 ml   Output    800 ml   Net    120 ml       I/O current shift:     Last Fingerstick:    Lab Results   Component Value Date    GLUCOSEPOC 98 01/21/2010       Emesis:    BM:    Last Bowel Movement: 01/20/10  Heme:      Current Medications:    Current facility-administered medications:  Diclofenac Sodium (VOLTAREN) tablet 75 mg 75 mg Oral 2x/day   docusate sodium (COLACE) capsule  100 mg Oral 2x/day   duloxetine (CYMBALTA) capsule  60 mg Oral Daily   esomeprazole (NEXIUM) capsule  40 mg Oral 2x/day AC   methocarbamol (ROBAXIN) tablet  500 mg Oral 3x/day   ampicillin-sulbactam (UNASYN) 3,000 mg in NS 100 mL IVPB 3 g Intravenous Q6H   ondansetron (ZOFRAN) 2 mg/mL injection  4 mg Intravenous Q6H PRN   oxyCODONE-acetaminophen (PERCOCET) 5-325mg  per tablet  1 Tab Oral Q6H PRN   DISCONTD: acetaminophen (TYLENOL) tablet  650 mg Oral Q4H PRN         Allergies   Allergen Reactions   . Codeine Itching     RASH    . Meloxicam Nausea/ Vomiting         Today's Physical Exam:  General: acutely ill  HENT:Head atraumatic and normocephalic, right eye sclera injected, some periorbital edema and erythema more severe on the lateral inferior border and eyelid.  improved swelling superiorly from prior exam.  Persistent, but improved swelling on the right cheek.  Lungs: Clear to auscultation bilaterally.   Cardiovascular: regular rate and rhythm  Abdomen: Soft, non-tender, Bowel sounds normal  Extremities: No cyanosis or edema  Skin: Skin warm and dry  Neurologic: Grossly normal  Psychiatric: flat affect    Labs:  Lab Results for Last 24 Hours:    Results for orders placed during the hospital encounter of 01/20/10 (from the past 24 hour(s))   CBC/DIFF    Collection  Time    01/20/10  4:45 PM   Component Value Range   . WBC 9.0  3.5 - 11.0 (THOU/uL)   . RBC 5.00  3.84 - 5.04 (MIL/uL)   . HGB 13.9  11.5 - 15.2 (g/dL)   . HCT 41.5  33.5 - 45.2 (%)   . MCV 83.0  82.0 - 99.0 (fL)   . MCH 27.9  27.4 - 33.0 (pg)   . MCHC 33.6  31.6 - 35.5 (g/dL)   . RDW 13.7  10.2 - 14.0 (%)   . PLATELET COUNT 404  140 - 450 (THOU/uL)   . MPV 6.4 (*) 7.4 - 10.4 (FL)   . PMN'S 59  40 - 75 (%)   . PMN ABS 5.300  1.5 - 7.7 (THOU/uL)   . LYMPHOCYTES 33  20 - 45 (%)   . LYMPHS ABS 2.980  1.0 - 4.8 (THOU/uL)   . MONOCYTES 6  4 - 13 (%)   . MONOS ABS 0.514  0.1 - 0.9 (THOU/uL)   . EOSINOPHIL 1  1 - 6 (%)   . EOS ABS 0.067 (*) 0.1 - 0.3 (THOU/uL)   . BASOPHILS 1  0 - 1 (%)   . BASOS ABS 0.120  0.0 - 0.2 (THOU/uL)   . NRBC'S 0  0 (/100WBC)   BASIC METABOLIC PANEL, NON-FASTING    Collection Time    01/20/10  4:45 PM   Component Value Range   . SODIUM 138  136 - 145 (mmol/L)   . POTASSIUM 3.6  3.5 - 5.1 (mmol/L)   . CHLORIDE 103  96 - 111 (mmol/L)   . CARBON DIOXIDE 25  22 - 32 (mmol/L)   . ANION GAP 10  5 - 16 (mmol/L)   . CREATININE 0.81  0.49 - 1.10 (mg/dL)   . ESTIMATED GLOMERULAR FILTRATION RATE >59  >59 (ml/min/1.101m2)   . GLUCOSE,NONFAST 121  65 - 139 (mg/dL)    . BUN 12  6 - 20 (mg/dL)   . BUN/CREAT RATIO 15  6 - 22    . CALCIUM 9.0  8.5 - 10.4 (mg/dL)   C-REACTIVE PROTEIN(CRP),INFLAMMATION    Collection Time    01/20/10  4:45 PM   Component Value Range   . C-Reactive Protein High Sensitivity (Inflammation) 0.640  <0.800 (mg/dL)   ADULT ROUTINE BLOOD CULTURE (2 BTLS)    Collection Time    01/20/10  6:20 PM   Component Value Range   . SPECIMEN DESCRIPTION        Value: BLOOD      RIGHT      HAND   . SPECIAL REQUESTS        Value: 1 BacT/ALERT FA and 1 BacT/ALERT SN bottle received   . POSITIVE CULTURE NOT REPORTED     . CULTURE OBSERVATION NO GROWTH 1 DAY     . REPORT STATUS PENDING     BASIC METABOLIC PANEL, NON-FASTING    Collection Time    01/21/10  2:15 AM   Component Value Range   . SODIUM 137  136 - 145 (mmol/L)   . POTASSIUM 4.4  3.5 - 5.1 (mmol/L)   . CHLORIDE 106  96 - 111 (mmol/L)   . CARBON DIOXIDE 24  22 - 32 (mmol/L)   . ANION GAP 7  5 - 16 (mmol/L)   . CREATININE 0.86  0.49 - 1.10 (mg/dL)   . ESTIMATED GLOMERULAR FILTRATION RATE >59  >59 (ml/min/1.67m2)   . GLUCOSE,NONFAST 99  65 - 139 (mg/dL)   . BUN 17  6 - 20 (mg/dL)   . BUN/CREAT RATIO 20  6 - 22    . CALCIUM 8.7  8.5 - 10.4 (mg/dL)   H & H    Collection Time    01/21/10  6:14 AM   Component Value Range   . HGB 13.6  11.5 - 15.2 (g/dL)   . HCT 39.8  33.5 - 45.2 (%)   POCT WHOLE BLOOD GLUCOSE    Collection Time    01/21/10  6:16 AM   Component Value Range   . GLUCOSE, POINT OF CARE 98  70 - 105 (mg/dL)         Nutrition/Residuals:  Regular    Prophylaxis:  Date Started Date Completed   DVT/PE  Not indicated and encourage ambulation and ROM     GI: Proton Pump inhibitor       Hardware (Lines, foley, tubes):   Date Placed  Date Changed   1.     2.     3.     4.     5.       Antibiotics: Day # Date Started Date Completed   1. unasyn  2 01/20/2010    2.      3.      4.          Radiology Results: None      Microbiology:   01/20/2010 blood: NGTD     Assessment/ Plan:   Active Hospital Problems   (*Primary Problem)    Diagnoses   . *Preseptal cellulitis       54 yo female with preseptal cellulitis   Preseptal cellulitis   - Patient afebrile, improving on unasyn  - blood cultures pending  - CBC/Diff, CRP, BMP within normal limits.  - unasyn   GERD   - continue home medications (Nexium 40BID)   Depression   - Continue Cymbalta 60mg  daily   Arthritis   - continue home voltaren, and methocarbamol      Disposition Planning: Home discharge on oral antibiotics likely 01/22/2010     Geralyn Flash, MD 01/21/2010, 8:49 AM        I saw and examined the patient.  I reviewed the resident's note.  I agree with the findings and plan of care as documented in the resident's note.  Any exceptions/additions are edited/noted.    Brendolyn Patty, MD 01/21/2010, 11:30 AM

## 2010-01-21 NOTE — Progress Notes (Signed)
Subjective:     Patient ID:  [name] is an 54 y.o. female   Chief Complaint:  Chief Complaint   Patient presents with   . Eye Swelling         HPI Comments: Right eye infection referred the patient for admission.      Review of Systems   Constitutional: Negative for fever.   Eyes:        Erythema edema tenderness to exam slight reduced visual acuity in the right eye.     Objective:   Physical Exam   Eyes:        70 showed the right eye revealed at the most is erythema and edema with slight discharge there is tearing tenderness and a hint of increased ear eczema above the eyebrow down around and including the top of the maxillary sinus. There is concern for orbital cellulitis versus periorbital cellulitis.     .    Assessment & Plan:     Oral versus periorbitally cellulitis:    I alerted the ward team for admission to hospital for IV antibiotics and further evaluation.

## 2010-01-21 NOTE — Nurses Notes (Signed)
No further change in assessment. Assessment per flowsheet. BP 106/58   Pulse 65   Temp 36.5 C (97.7 F)   Resp 20   Ht 1.651 m (5\' 5" )   Wt 93.895 kg (207 lb)   BMI 34.45 kg/m2   SpO2 96% Pt resting in bed. Will continue to monitor

## 2010-01-21 NOTE — Care Management Notes (Addendum)
======================================================================    Patient Name: [name]  DOB: 1956-04-18  Age: 54   Account Number: [id number]  MR Number: [id number]    ======================================================================  Admission Information  Encounter Type: Inpatient  Patient Type: INPATIENT  Admit Date: 01/20/2010  Admit Time: 16:31  Admit Reason: periorbital cellulitis  Admitting Phys: Brendolyn Patty MD  Attending Phys: Earnest Rosier MD  Unit: 7 WEST  Room/Bed: 709 / A    ======================================================================  Discharge Information  Actual D/C Date: 01/24/2010  Actual LOS: 4  ADT Disch/Disp: HOME/SELF CARE (Code 01)  CC Disch/Disp: HOME/SELF CARE (Code 01)    ======================================================================  Assessment Information  Status: Completed 01/29/2010  Discharge Manager: Trudee Kuster [id number]  Transition Manager: Manual Meier [phone]    ======================================================================  Problems Identified  ----------------------------------------------------------------------  DC-Patient Assessment and Education; Onset 01/21/2010; Completed 01/29/2010  01/21/2010 13:29 Assessment Form [name] [id number]) Findings: Met with pt/family/other at bedside and provided education on the role of the Care Mgmt Dept and Clinical Care Coordinator in relation to their hospital stay and discharge planning from [facility] to their next level of care. ; Provided contact information to patient/family and questions answered. Patient/family/guardian verbalized understanding.    Post DC Needs; Onset 01/21/2010; Completed 01/29/2010  01/21/2010 13:29 Assessment Form [name] [id number]) Findings: Patient/Family Participation with Planning for Post Discharge Care-Pt/Family in agreement with and have participated in planning for post discharge  care    ======================================================================  Discharge Plan Notes  ----------------------------------------------------------------------  01/21/2010 13:28 [name] [id number] HD#1     Pt is a 54 y.o., White female who presents with a 3 day history of redness, swelling, and pain around her right eye. She reports noticing a small painful area of redness on the lateral border of her right eye.         Pt is awake, alert, and oriented. Pt resides at home alone but has support near by if needed. Pt has prescription coverage through insurance and uses [facility]. Pt is being seen by Dr. Luan Pulling. No MPOA at this time. Pt has a wheelchair at home from [facility]. No previous DME/ HH/ VNA established at this time. Pt will have a means of transportation when discharged from the hospital. [facility] to follow.  01/23/2010 11:59 [name] [id number] HD#3     Pt to be discharged on Zyvox 600mg  PO BID for 2weeks. MD notified that medication will need to be authorized. [facility] to follow.

## 2010-01-21 NOTE — Nurses Notes (Signed)
Pt is admitted for periorbital cellulitis of the R eye. R eye is swollen, red, and has small amount of yellow drainage. Pt states R eye is blurry. Rates pain 6 at this moment. Declines pain intervention. Pt is alert and oriented. Lungs sounds clear. Assessment per flowsheet. BP 131/83   Pulse 74   Temp 36.5 C (97.7 F)   Resp 20   Ht 1.651 m (5\' 5" )   Wt 93.895 kg (207 lb)   BMI 34.45 kg/m2   SpO2 96% Pt resting in bed. Will monitor.

## 2010-01-22 ENCOUNTER — Other Ambulatory Visit (INDEPENDENT_AMBULATORY_CARE_PROVIDER_SITE_OTHER): Payer: MEDICAID

## 2010-01-22 ENCOUNTER — Inpatient Hospital Stay (HOSPITAL_COMMUNITY): Payer: MEDICAID | Admitting: Radiology

## 2010-01-22 LAB — CBC/DIFF
BASOPHILS: 1 % (ref 0–1)
BASOS ABS: 0.101 THOU/uL (ref 0.0–0.2)
EOS ABS: 0.053 THOU/uL — ABNORMAL LOW (ref 0.1–0.3)
EOSINOPHIL: 1 % (ref 1–6)
HCT: 39.5 % (ref 33.5–45.2)
HGB: 13.5 g/dL (ref 11.5–15.2)
LYMPHOCYTES: 33 % (ref 20–45)
LYMPHS ABS: 2.92 THOU/uL (ref 1.0–4.8)
MCH: 28.3 pg (ref 27.4–33.0)
MCHC: 34.2 g/dL (ref 31.6–35.5)
MCV: 82.7 fL (ref 82.0–99.0)
MONOCYTES: 6 % (ref 4–13)
MONOS ABS: 0.507 THOU/uL (ref 0.1–0.9)
MPV: 7.6 FL (ref 7.4–10.4)
NRBC'S: 0 /100{WBCs}
PLATELET COUNT: 304 THOU/uL (ref 140–450)
PMN ABS: 5.27 THOU/uL (ref 1.5–7.7)
PMN'S: 59 % (ref 40–75)
RBC: 4.78 MIL/uL (ref 3.84–5.04)
RDW: 13.9 % (ref 10.2–14.0)
WBC: 8.9 THOU/uL (ref 3.5–11.0)

## 2010-01-22 LAB — BASIC METABOLIC PANEL
ANION GAP: 9 mmol/L (ref 5–16)
BUN/CREAT RATIO: 16 (ref 6–22)
BUN: 16 mg/dL (ref 6–20)
CALCIUM: 9.1 mg/dL (ref 8.5–10.4)
CARBON DIOXIDE: 26 mmol/L (ref 22–32)
CHLORIDE: 102 mmol/L (ref 96–111)
CREATININE: 1.03 mg/dL (ref 0.49–1.10)
ESTIMATED GLOMERULAR FILTRATION RATE: 56 mL/min/{1.73_m2} — ABNORMAL LOW (ref >59)
GLUCOSE,NONFAST: 109 mg/dL (ref 65–139)
POTASSIUM: 4.1 mmol/L (ref 3.5–5.1)
SODIUM: 137 mmol/L (ref 136–145)

## 2010-01-22 LAB — PERFORM POC WHOLE BLOOD GLUCOSE: GLUCOSE, POINT OF CARE: 115 mg/dL — ABNORMAL HIGH (ref 70–105)

## 2010-01-22 MED ORDER — SODIUM CHLORIDE 0.9 % INTRAVENOUS SOLUTION
3.0000 g | Freq: Four times a day (QID) | INTRAVENOUS | Status: DC
Start: 2010-01-22 — End: 2010-01-25
  Administered 2010-01-22 – 2010-01-24 (×8): 3000 mg via INTRAVENOUS
  Filled 2010-01-22 (×12): qty 3000

## 2010-01-22 MED ORDER — IOVERSOL 320 MG IODINE/ML INTRAVENOUS SOLUTION
100.00 mL | INTRAVENOUS | Status: AC
Start: 2010-01-22 — End: 2010-01-22
  Administered 2010-01-22: 100 mL via INTRAVENOUS

## 2010-01-22 MED ORDER — VANCOMYCIN 10 GRAM INTRAVENOUS SOLUTION
15.0000 mg/kg | Freq: Two times a day (BID) | INTRAVENOUS | Status: DC
Start: 2010-01-22 — End: 2010-01-22
  Administered 2010-01-22: 0 mg via INTRAVENOUS
  Filled 2010-01-22 (×3): qty 15

## 2010-01-22 MED ORDER — CLINDAMYCIN 150 MG/ML INJECTION SOLUTION
900.00 mg | Freq: Three times a day (TID) | INTRAMUSCULAR | Status: DC
Start: 2010-01-22 — End: 2010-01-22
  Filled 2010-01-22 (×4): qty 6

## 2010-01-22 MED ORDER — VANCOMYCIN 10 GRAM INTRAVENOUS SOLUTION
15.0000 mg/kg | Freq: Two times a day (BID) | INTRAVENOUS | Status: DC
Start: 2010-01-22 — End: 2010-01-25
  Administered 2010-01-22 – 2010-01-24 (×4): 1500 mg via INTRAVENOUS
  Filled 2010-01-22 (×6): qty 15

## 2010-01-22 MED ORDER — SULFAMETHOXAZOLE 400 MG-TRIMETHOPRIM 80 MG/5 ML INTRAVENOUS SOLUTION
10.0000 mg/kg/d | Freq: Four times a day (QID) | INTRAVENOUS | Status: DC
Start: 2010-01-22 — End: 2010-01-22

## 2010-01-22 MED ORDER — DEXTROSE 5 % IN WATER (D5W) INTRAVENOUS SOLUTION
600.0000 mg | Freq: Three times a day (TID) | INTRAVENOUS | Status: DC
Start: 2010-01-22 — End: 2010-01-22

## 2010-01-22 MED ORDER — MOXIFLOXACIN 0.5 % EYE DROPS
1.00 [drp] | Freq: Three times a day (TID) | OPHTHALMIC | Status: DC
Start: 2010-01-22 — End: 2010-01-25
  Administered 2010-01-22 – 2010-01-24 (×7): 1 [drp] via OPHTHALMIC
  Filled 2010-01-22: qty 3

## 2010-01-22 NOTE — Nurses Notes (Signed)
R eye swollen and increased redness. No further changes from initial assessment. Assessment per flowhseet. BP 136/65   Pulse 76   Temp 36.6 C (97.9 F)   Resp 20   Ht 1.651 m (5\' 5" )   Wt 93.895 kg (207 lb)   BMI 34.45 kg/m2   SpO2 97% Pain med giver per order; pt rates pain 5 on numeric pain scale. Pt resting in bed. Will monitor.

## 2010-01-22 NOTE — Progress Notes (Addendum)
[facility]                                       FAMILY MEDICINE PROGRESS NOTE    [name], [name], 54 y.o. female  Date of Admission:  01/20/2010  Date of service: 01/22/2010  Date of Birth:  [birth year]    Hospital Day:  LOS: 2 days     Subjective: Overnight patient reported increase in pain of right eye.  On exam some worsening vision in the right eye, increased purulent draiage and increased pain.      Vital Signs:  Systolic (24hrs), Avg:130 mmHg, Min:113 mmHg, Max:146 mmHg    Diastolic (24hrs), Avg:71 mmHg, Min:58 mmHg, Max:83 mmHg    Temp  Avg: 36.7 C (98 F)  Min: 36.5 C (97.7 F)  Max: 36.7 C (98.1 F)  Pulse  Avg: 73   Min: 66   Max: 82   Resp  Avg: 19.7   Min: 18   Max: 20   SpO2  Avg: 96.7 %  Min: 96 %  Max: 97 %  Pain Score: 7    I/O:  I/O last 24 hours:    Intake/Output Summary (Last 24 hours) at 01/22/10 0830  Last data filed at 01/22/10 0700   Gross per 24 hour   Intake   1860 ml   Output   2700 ml   Net   -840 ml       I/O current shift:     Last Fingerstick:    Lab Results   Component Value Date    GLUCOSEPOC 115* 01/22/2010       Emesis:    BM:    Last Bowel Movement: 01/21/10  Heme:      Current Medications:    Current facility-administered medications:  Diclofenac Sodium (VOLTAREN) tablet 75 mg 75 mg Oral 2x/day   docusate sodium (COLACE) capsule  100 mg Oral 2x/day   duloxetine (CYMBALTA) capsule  60 mg Oral Daily   esomeprazole (NEXIUM) capsule  40 mg Oral 2x/day AC   methocarbamol (ROBAXIN) tablet  500 mg Oral 3x/day   ampicillin-sulbactam (UNASYN) 3,000 mg in NS 100 mL IVPB 3 g Intravenous Q6H   ondansetron (ZOFRAN) 2 mg/mL injection  4 mg Intravenous Q6H PRN   oxyCODONE-acetaminophen (PERCOCET) 5-325mg  per tablet  1 Tab Oral Q6H PRN         Allergies   Allergen Reactions   . Codeine Itching     RASH   . Meloxicam Nausea/ Vomiting         Today's Physical Exam:  General: acutely ill   HENT:Head atraumatic and normocephalic, right eye sclera injected, some periorbital edema and erythema more severe on the lateral inferior border and eyelid.  improved swelling superiorly from prior exam.  Persistent, swelling of the right cheek.  Pupils equal and reactive to light, EOMI, some purulence draining out of the lateral side of the eye.  Her vision is 20/70 in left eye and 20/200 in the right eye with her glasses on.  Lungs: Clear to auscultation bilaterally.   Cardiovascular: regular rate and rhythm  Abdomen: Soft, non-tender, Bowel sounds normal  Extremities: No cyanosis or edema  Skin: Skin warm and dry  Neurologic: Grossly normal  Psychiatric: flat affect    Labs:  Lab Results for Last 24 Hours:    Results for orders placed during the hospital encounter of 01/20/10 (  from the past 24 hour(s))   CBC/DIFF    Collection Time    01/22/10  2:15 AM   Component Value Range   . WBC 8.9  3.5 - 11.0 (THOU/uL)   . RBC 4.78  3.84 - 5.04 (MIL/uL)   . HGB 13.5  11.5 - 15.2 (g/dL)   . HCT 39.5  33.5 - 45.2 (%)   . MCV 82.7  82.0 - 99.0 (fL)   . MCH 28.3  27.4 - 33.0 (pg)   . MCHC 34.2  31.6 - 35.5 (g/dL)   . RDW 13.9  10.2 - 14.0 (%)   . PLATELET COUNT 304  140 - 450 (THOU/uL)   . MPV 7.6  7.4 - 10.4 (FL)   . PMN'S 59  40 - 75 (%)   . PMN ABS 5.270  1.5 - 7.7 (THOU/uL)   . LYMPHOCYTES 33  20 - 45 (%)   . LYMPHS ABS 2.920  1.0 - 4.8 (THOU/uL)   . MONOCYTES 6  4 - 13 (%)   . MONOS ABS 0.507  0.1 - 0.9 (THOU/uL)   . EOSINOPHIL 1  1 - 6 (%)   . EOS ABS 0.053 (*) 0.1 - 0.3 (THOU/uL)   . BASOPHILS 1  0 - 1 (%)   . BASOS ABS 0.101  0.0 - 0.2 (THOU/uL)   . NRBC'S 0  0 (/100WBC)   BASIC METABOLIC PANEL, NON-FASTING    Collection Time    01/22/10  2:15 AM   Component Value Range   . SODIUM 137  136 - 145 (mmol/L)   . POTASSIUM 4.1  3.5 - 5.1 (mmol/L)   . CHLORIDE 102  96 - 111 (mmol/L)   . CARBON DIOXIDE 26  22 - 32 (mmol/L)   . ANION GAP 9  5 - 16 (mmol/L)   . CREATININE 1.03  0.49 - 1.10 (mg/dL)    . ESTIMATED GLOMERULAR FILTRATION RATE 56 (*) >59 (ml/min/1.34m2)   . GLUCOSE,NONFAST 109  65 - 139 (mg/dL)   . BUN 16  6 - 20 (mg/dL)   . BUN/CREAT RATIO 16  6 - 22    . CALCIUM 9.1  8.5 - 10.4 (mg/dL)   POCT WHOLE BLOOD GLUCOSE    Collection Time    01/22/10  5:59 AM   Component Value Range   . GLUCOSE, POINT OF CARE 115 (*) 70 - 105 (mg/dL)         Nutrition/Residuals:  Regular    Prophylaxis:  Date Started Date Completed   DVT/PE  Not indicated and encourage ambulation and ROM     GI: Proton Pump inhibitor       Hardware (Lines, foley, tubes):   Date Placed  Date Changed   1.     2.     3.     4.     5.       Antibiotics: Day # Date Started Date Completed   1. unasyn  3 01/20/2010    2. vancomycin  1  01/22/2010    3.      4.          Radiology Results:   01/22/2010 CT orbits: read pending    Microbiology:   01/20/2010 blood: NGTD     Assessment/ Plan:   Active Hospital Problems   (*Primary Problem)   Diagnoses   . *Preseptal cellulitis       54 yo female with preseptal cellulitis   Preseptal cellulitis   -  Patient afebrile, WBC count normal  - Vision worsening in right eye and purulence and pain have increased  - will perform CT orbits to rule out orbital cellulitis  - Add vancomycin to Unasyn for broader antimicrobial coverage.  - blood cultures pending  - CBC/Diff, CRP, BMP within normal limits.  GERD   - continue home medications (Nexium 40BID)   Depression   - Continue Cymbalta 60mg  daily   Arthritis   - continue home voltaren, and methocarbamol      Disposition Planning: Home discharge     Geralyn Flash, MD 01/22/2010, 8:30 AM      I saw and examined the patient.  I reviewed the resident's note.  I agree with the findings and plan of care as documented in the resident's note.  Any exceptions/additions are edited/noted.    After reviewing UpToDate for recommendations we will continue with the vancomycin and unasyn and obtain CT orbits since her vision did decline somewhat.  Vigamox drops also added.     Dagoberto Ligas, DO 01/22/2010, 2:27 PM

## 2010-01-22 NOTE — Nurses Notes (Signed)
Increased redness to R eye. Notified Dr Early Chars of assessment. No further orders at this moment. Will continue to monitor.

## 2010-01-22 NOTE — Progress Notes (Addendum)
Alta Goding  [id number]  01/22/2010      Off Service Note    [name] is a 54 yo female admitted for preseptal cellulitis of the right eye, onset was approximately sunday 01/18/2010.  She was given a prescription for erythromycin ointment at urgent care on Monday.  Her cellulitis continued to get worse so she came to the family medicine clinic on 3/29 and was admitted for IV antibiotics.  She was placed on unasyn initially with good response the first day.  The eventing of the second day she reported increased pain and increasing discharge from her right eye.  On exam on 01/22/2010 her vision was worse in her right eye than on previous exams.  A CT scan of the orbits was obtained to rule out orbital cellulitis and her antibiotic coverage was extended by adding vancomycin IV and moxifloxacin eye drops.  (vancomycin and unasyn are recommended by uptodate for the treatment of orbital cellulitis.).  The CT did not have evidence of post septal or orbital cellulitis.  She has remained afebrile and her WBC count has remaine within normal limits.    Geralyn Flash, MD 01/22/2010, 9:20 PM

## 2010-01-23 LAB — BASIC METABOLIC PANEL
ANION GAP: 8 mmol/L (ref 5–16)
BUN/CREAT RATIO: 16 (ref 6–22)
BUN: 14 mg/dL (ref 6–20)
CALCIUM: 8.7 mg/dL (ref 8.5–10.4)
CARBON DIOXIDE: 25 mmol/L (ref 22–32)
CHLORIDE: 104 mmol/L (ref 96–111)
CREATININE: 0.88 mg/dL (ref 0.49–1.10)
ESTIMATED GLOMERULAR FILTRATION RATE: >59 ml/min/1.73m2 (ref >59)
GLUCOSE,NONFAST: 96 mg/dL (ref 65–139)
POTASSIUM: 4 mmol/L (ref 3.5–5.1)
SODIUM: 137 mmol/L (ref 136–145)

## 2010-01-23 LAB — CBC/DIFF
BASOPHILS: 2 % — ABNORMAL HIGH (ref 0–1)
BASOS ABS: 0.182 THOU/uL (ref 0.0–0.2)
EOS ABS: 0.062 THOU/uL — ABNORMAL LOW (ref 0.1–0.3)
EOSINOPHIL: 1 % (ref 1–6)
HCT: 40.2 % (ref 33.5–45.2)
HGB: 13.4 g/dL (ref 11.5–15.2)
LYMPHOCYTES: 34 % (ref 20–45)
LYMPHS ABS: 2.92 THOU/uL (ref 1.0–4.8)
MCH: 27.8 pg (ref 27.4–33.0)
MCHC: 33.3 g/dL (ref 31.6–35.5)
MCV: 83.2 fL (ref 82.0–99.0)
MONOCYTES: 7 % (ref 4–13)
MONOS ABS: 0.622 THOU/uL (ref 0.1–0.9)
MPV: 6.7 FL — ABNORMAL LOW (ref 7.4–10.4)
NRBC'S: 0 /100{WBCs}
PLATELET COUNT: 409 THOU/uL (ref 140–450)
PMN ABS: 4.86 THOU/uL (ref 1.5–7.7)
PMN'S: 56 % (ref 40–75)
RBC: 4.83 MIL/uL (ref 3.84–5.04)
RDW: 14 % (ref 10.2–14.0)
WBC: 8.7 10*3/uL (ref 3.5–11.0)

## 2010-01-23 LAB — PERFORM POC WHOLE BLOOD GLUCOSE
GLUCOSE, POINT OF CARE: 111 mg/dL — ABNORMAL HIGH (ref 70–105)
GLUCOSE, POINT OF CARE: <25 mg/dL — CL (ref 70–105)

## 2010-01-23 MED ORDER — LABETALOL 5 MG/ML INTRAVENOUS SYRINGE
INJECTION | INTRAVENOUS | Status: AC
Start: 2010-01-23 — End: 2010-01-23
  Filled 2010-01-23: qty 4

## 2010-01-23 NOTE — CDI WORKSHEET (Addendum)
DRG NLV   Working DRG 1: 125 Other disorders of the eye w/o [facility] 1/1  Principle Diagnosis: Preseptal cellulitis   (I am coding this as eyelid because that is the definition per medical site, and facial does not seem adequate)   Final MS-DRG:  125 Other disorders of the eye w/o [facility]   Reason Code: 1-Final/Concurrent DRG Match - Optimal Selection    Comments:     Final Coder:  [place]        Principle Procedure: Procedure Date:   Secondary Dx:  Hx tobacco  Bipolar  GERD  Arthritis   Secondary Procedures:     Past Medical Hx:     Comments:     Home Medications:

## 2010-01-23 NOTE — Nurses Notes (Signed)
Patient alert and oriented. Right scelera red. Right perioribital area edematous, no drainage noted at this time. Patient denies blurry vision, states "it is feeling better". Denies pain meds at this time. Educated patient to leave right eye uncovered per MD orders to allow eye to drain. Will continue to monitor.

## 2010-01-23 NOTE — CDI REVIEW (Signed)
Admissions, Findings, and Consultations:  3/29 DFM H&P:53 y.o., [name] female who presents with a 3 day history of redness, swelling, and pain around her right eye, Hx tobacco, bipolar, GERD, Current:   Preseptal cellulitis , arthritis, GERD   3/30 DFM: same  3/31 DFM: will perform CT orbits to rule out orbital cellulitis  4/1 DFM pended: same   Diagnostics and Results:   Procedures and Treatments:   Meds, IV's, Rx Blood:   Comments:

## 2010-01-23 NOTE — Progress Notes (Addendum)
[facility]                                       FAMILY MEDICINE PROGRESS NOTE    [name], [name], 54 y.o. female  Date of Admission:  01/20/2010  Date of service: 01/23/2010  Date of Birth:  02/13/1956    Hospital Day:  LOS: 3 days     Subjective:   Patient reports that blurring of vision has decreased and she feels swelling has improved. She still notes 3-4/10 R periorbital pain. She denies any new issues overnight. She has been afebrile since admission.       Vital Signs:  Systolic (24hrs), Avg:124 mmHg, Min:100 mmHg, Max:141 mmHg    Diastolic (24hrs), Avg:74 mmHg, Min:65 mmHg, Max:83 mmHg    Temp  Avg: 36.6 C (97.9 F)  Min: 36.5 C (97.7 F)  Max: 36.7 C (98.1 F)  Pulse  Avg: 82.8   Min: 68   Max: 101   Resp  Avg: 19.3   Min: 18   Max: 20   SpO2  Avg: 95 %  Min: 95 %  Max: 95 %  Pain Score: 4    I/O:  I/O last 24 hours:    Intake/Output Summary (Last 24 hours) at 01/23/10 1149  Last data filed at 01/23/10 1117   Gross per 24 hour   Intake   2580 ml   Output   2950 ml   Net   -370 ml       I/O current shift:  In: 240 [P.O.:240]  Out: 1050 [Urine:1050]  Last Fingerstick:    Lab Results   Component Value Date    GLUCOSEPOC 111* 01/23/2010       Emesis:    BM:    Last Bowel Movement: 01/21/10 (patient states passing gas and doesnt need anything else)  Heme:      Current Medications:    Current facility-administered medications:  moxifloxacin (VIGAMOX) 0.5 % ophthalmic solution  1 Drop Both Eyes 3x/day   ampicillin-sulbactam (UNASYN) 3,000 mg in NS 100 mL IVPB 3 g Intravenous Q6H   vancomycin (VANCOCIN) 1,500 mg in NS 500 mL IVPB 15 mg/kg Intravenous Q12H   ioversol (OPTIRAY 320) infusion  100 mL Intravenous Give in Radiology   Diclofenac Sodium (VOLTAREN) tablet 75 mg 75 mg Oral 2x/day   docusate sodium (COLACE) capsule  100 mg Oral 2x/day   duloxetine (CYMBALTA) capsule  60 mg Oral Daily   esomeprazole (NEXIUM) capsule  40 mg Oral 2x/day AC    methocarbamol (ROBAXIN) tablet  500 mg Oral 3x/day   ondansetron (ZOFRAN) 2 mg/mL injection  4 mg Intravenous Q6H PRN   oxyCODONE-acetaminophen (PERCOCET) 5-325mg  per tablet  1 Tab Oral Q6H PRN         Allergies   Allergen Reactions   . Codeine Itching     RASH   . Meloxicam Nausea/ Vomiting         Today's Physical Exam:  General: acutely ill and no distress  Eyes: abnormal findings: R eye: conjunctiva injected, (+) erythematous swelling of lower lid with dried scab lateral side; EOMs intact for and pupils equal and reactive to light for both eyes  HENT:no maxillary or frontal sinus tenderness  Neck: no adenopathy  Lungs: Clear to auscultation bilaterally.   Cardiovascular: regular rate and rhythm  Abdomen: Soft, non-tender, Bowel sounds normal  Extremities: No cyanosis or  edema    Labs:  Results for orders placed during the hospital encounter of 01/20/10 (from the past 12 hour(s))   CBC/DIFF   Component Value Range   . WBC 8.7  3.5 - 11.0 (THOU/uL)   . RBC 4.83  3.84 - 5.04 (MIL/uL)   . HGB 13.4  11.5 - 15.2 (g/dL)   . HCT 40.2  33.5 - 45.2 (%)   . MCV 83.2  82.0 - 99.0 (fL)   . MCH 27.8  27.4 - 33.0 (pg)   . MCHC 33.3  31.6 - 35.5 (g/dL)   . RDW 14.0  10.2 - 14.0 (%)   . PLATELET COUNT 409  140 - 450 (THOU/uL)   . MPV 6.7 (*) 7.4 - 10.4 (FL)   . PMN'S 56  40 - 75 (%)   . PMN ABS 4.860  1.5 - 7.7 (THOU/uL)   . LYMPHOCYTES 34  20 - 45 (%)   . LYMPHS ABS 2.920  1.0 - 4.8 (THOU/uL)   . MONOCYTES 7  4 - 13 (%)   . MONOS ABS 0.622  0.1 - 0.9 (THOU/uL)   . EOSINOPHIL 1  1 - 6 (%)   . EOS ABS 0.062 (*) 0.1 - 0.3 (THOU/uL)   . BASOPHILS 2 (*) 0 - 1 (%)   . BASOS ABS 0.182  0.0 - 0.2 (THOU/uL)   . NRBC'S 0  0 (/100WBC)   BASIC METABOLIC PANEL, NON-FASTING   Component Value Range   . SODIUM 137  136 - 145 (mmol/L)   . POTASSIUM 4.0  3.5 - 5.1 (mmol/L)   . CHLORIDE 104  96 - 111 (mmol/L)   . CARBON DIOXIDE 25  22 - 32 (mmol/L)   . ANION GAP 8  5 - 16 (mmol/L)   . CREATININE 0.88  0.49 - 1.10 (mg/dL)    . ESTIMATED GLOMERULAR FILTRATION RATE >59  >59 (ml/min/1.41m2)   . GLUCOSE,NONFAST 96  65 - 139 (mg/dL)   . BUN 14  6 - 20 (mg/dL)   . BUN/CREAT RATIO 16  6 - 22    . CALCIUM 8.7  8.5 - 10.4 (mg/dL)         Nutrition/Residuals:  Regular    Prophylaxis:  Date Started Date Completed   DVT/PE  SCDs/ Venodynes     GI: Proton Pump inhibitor         Antibiotics: Day # Date Started Date Completed   1.Unasyn 2 01/22/10    2.Vancomycin 2 01/22/10        Assessment/ Plan:   Active Hospital Problems   (*Primary Problem)   Diagnoses   . *Preseptal cellulitis     - started Unasyn 3g OD, Vancomycin 1.5g BID, and Moxifloxacin eye drops TID on 01/22/10  - will shift to Augmentin 875mg  BID and Zyvox 600mg  BID to complete 2 weeks today if can go home after Medicaid approval of Zyvox, if not will      keep patient on IV abx for the next few days     . Depression     Chronic     - Continue Cymbalta 60mg  OD     . GERD not well controlled     Chronic     EGD done by Dr Alto Denver 12/19/07 nonerosive GERD, erythema and patulous GE junction, continue PPI and antireflux measures.  Esophageal manometry 01/03/08 essentially normal. PH probe testing--abnormal off PPI  - Continue Nexium 40mg  BID         Disposition  Planning: Home discharge      Valrie Hart Polistico, MD 01/23/2010, 11:49 AM      I saw and examined the patient.  I reviewed the resident's note.  I agree with the findings and plan of care as documented in the resident's note.  Any exceptions/additions are edited/noted.    Dagoberto Ligas, DO 01/23/2010, 3:13 PM

## 2010-01-24 LAB — BASIC METABOLIC PANEL
ANION GAP: 6 mmol/L (ref 5–16)
BUN/CREAT RATIO: 14 (ref 6–22)
BUN: 14 mg/dL (ref 6–20)
CALCIUM: 8.8 mg/dL (ref 8.5–10.4)
CARBON DIOXIDE: 26 mmol/L (ref 22–32)
CHLORIDE: 105 mmol/L (ref 96–111)
CREATININE: 0.97 mg/dL (ref 0.49–1.10)
ESTIMATED GLOMERULAR FILTRATION RATE: >59 ml/min/1.73m2 (ref >59)
GLUCOSE,NONFAST: 102 mg/dL (ref 65–139)
POTASSIUM: 4.2 mmol/L (ref 3.5–5.1)
SODIUM: 137 mmol/L (ref 136–145)

## 2010-01-24 LAB — VANCOMYCIN, TROUGH: VANCOMYCIN, TROUGH: 16 ug/mL — ABNORMAL HIGH (ref 5.0–10.0)

## 2010-01-24 LAB — CBC/DIFF
BASOPHILS: 1 % (ref 0–1)
BASOS ABS: 0.047 THOU/uL (ref 0.0–0.2)
EOS ABS: 0.109 THOU/uL (ref 0.1–0.3)
EOSINOPHIL: 1 % (ref 1–6)
HCT: 39.5 % (ref 33.5–45.2)
HGB: 13 g/dL (ref 11.5–15.2)
LYMPHOCYTES: 34 % (ref 20–45)
LYMPHS ABS: 2.94 THOU/uL (ref 1.0–4.8)
MCH: 27.2 pg — ABNORMAL LOW (ref 27.4–33.0)
MCHC: 33 g/dL (ref 31.6–35.5)
MCV: 82.6 fL (ref 82.0–99.0)
MONOCYTES: 8 % (ref 4–13)
MONOS ABS: 0.697 THOU/uL (ref 0.1–0.9)
MPV: 6.6 FL — ABNORMAL LOW (ref 7.4–10.4)
NRBC'S: 0 /100{WBCs}
PLATELET COUNT: 438 THOU/uL (ref 140–450)
PMN ABS: 4.74 THOU/uL (ref 1.5–7.7)
PMN'S: 56 % (ref 40–75)
RBC: 4.78 MIL/uL (ref 3.84–5.04)
RDW: 14.2 % — ABNORMAL HIGH (ref 10.2–14.0)
WBC: 8.5 THOU/uL (ref 3.5–11.0)

## 2010-01-24 LAB — PERFORM POC WHOLE BLOOD GLUCOSE: GLUCOSE, POINT OF CARE: 111 mg/dL — ABNORMAL HIGH (ref 70–105)

## 2010-01-24 MED ORDER — AMOXICILLIN 875 MG-POTASSIUM CLAVULANATE 125 MG TABLET
1.00 | ORAL_TABLET | Freq: Two times a day (BID) | ORAL | Status: DC
Start: 2010-01-24 — End: 2010-02-26

## 2010-01-24 MED ORDER — CLINDAMYCIN HCL 300 MG CAPSULE
300.00 mg | ORAL_CAPSULE | Freq: Four times a day (QID) | ORAL | Status: DC
Start: 2010-01-24 — End: 2010-03-05

## 2010-01-24 MED ORDER — MOXIFLOXACIN 0.5 % EYE DROPS
1.00 [drp] | Freq: Three times a day (TID) | OPHTHALMIC | Status: DC
Start: 2010-01-24 — End: 2010-03-05

## 2010-01-24 NOTE — Nurses Notes (Signed)
Patient alert and oriented. Right sclera red. Edema noted to right periorbital area, no drainage noted. Patient denies pain. Right eye open to air per MD orders. 22g PIV to left wrist heplocked. Patient denies any needs at this time. Will continue to monitor.

## 2010-01-24 NOTE — Nurses Notes (Signed)
Discharge summary reviewed with pt. Pt instructed on f/u visit with DFM. Pt given instruction on prescriptions. Pt instructed to return to hospital if she acquires a fever, has increased reddness, or increased pain. Pt verbalized understanding of all instruction and stated that she had no questions. Pt IV removed, tip intact, pt tol well. Pt ambulated off of unit.

## 2010-01-24 NOTE — Progress Notes (Addendum)
[facility]                                       FAMILY MEDICINE PROGRESS NOTE    Rosey, Eide, 54 y.o. female  Date of Admission:  01/20/2010  Date of service: 01/24/2010  Date of Birth:  [birth year]    Hospital Day:  LOS: 4 days     Subjective: Patient feels improved this morning over yesterday. She states she had some bloody drainage directly beneath her right eye during the night, but she states her vision continues to improve, as does her swelling and pain.       Vital Signs:  Filed Vitals:    01/24/10 0334 01/24/10 0356 01/24/10 0700 01/24/10 0851   BP: 160/84 124/82 141/87    Pulse: 71  85    Temp: 36.6 C (97.9 F)  36.6 C (97.9 F)    Resp: 18  18    SpO2:    98%       I/O:  I/O last 24 hours:    Intake/Output Summary (Last 24 hours) at 01/24/10 1232  Last data filed at 01/24/10 0851   Gross per 24 hour   Intake   2500 ml   Output   1900 ml   Net    600 ml     I/O current shift:  In: 360 [P.O.:360]  Out: -   Point of Care Glucose Last 24 Hr Results:    Recent Labs   [facility] 01/24/10 0627   . GLUCOSEPOC 111*       Emesis:    BM:    Last Bowel Movement: 01/21/10 (per pt report)  Heme:      Current Medications:    Current facility-administered medications:  moxifloxacin (VIGAMOX) 0.5 % ophthalmic solution  1 Drop Both Eyes 3x/day   ampicillin-sulbactam (UNASYN) 3,000 mg in NS 100 mL IVPB 3 g Intravenous Q6H   vancomycin (VANCOCIN) 1,500 mg in NS 500 mL IVPB 15 mg/kg Intravenous Q12H   Diclofenac Sodium (VOLTAREN) tablet 75 mg 75 mg Oral 2x/day   docusate sodium (COLACE) capsule  100 mg Oral 2x/day   duloxetine (CYMBALTA) capsule  60 mg Oral Daily   esomeprazole (NEXIUM) capsule  40 mg Oral 2x/day AC   methocarbamol (ROBAXIN) tablet  500 mg Oral 3x/day   ondansetron (ZOFRAN) 2 mg/mL injection  4 mg Intravenous Q6H PRN   oxyCODONE-acetaminophen (PERCOCET) 5-325mg  per tablet  1 Tab Oral Q6H PRN         Today's Physical Exam:   Alert and oriented, NAD  Redness and swelling beneath right eye improved since yesterday. There is a small amount of induration, but no fluctuance. The lateral bulbar conjunctiva on the right continues to be chemotic, but better than yesterday. EOMI w/ no pain. Pupils react to light. Right pupil is 1 mm smaller than left pupil.    Heart regular, no murmurs  Lungs CTAB, no wheezes, rhonchi, or crackles  Lower extremities nontender to palpation. No edema, no erythema.     Labs:  Lab Results for Last 24 Hours:    Results for orders placed during the hospital encounter of 01/20/10 (from the past 24 hour(s))   CBC/DIFF    Collection Time    01/24/10  2:15 AM   Component Value Range   . WBC 8.5  3.5 - 11.0 (THOU/uL)   . RBC 4.78  3.84 - 5.04 (MIL/uL)   . HGB 13.0  11.5 - 15.2 (g/dL)   . HCT 39.5  33.5 - 45.2 (%)   . MCV 82.6  82.0 - 99.0 (fL)   . MCH 27.2 (*) 27.4 - 33.0 (pg)   . MCHC 33.0  31.6 - 35.5 (g/dL)   . RDW 14.2 (*) 10.2 - 14.0 (%)   . PLATELET COUNT 438  140 - 450 (THOU/uL)   . MPV 6.6 (*) 7.4 - 10.4 (FL)   . PMN'S 56  40 - 75 (%)   . PMN ABS 4.740  1.5 - 7.7 (THOU/uL)   . LYMPHOCYTES 34  20 - 45 (%)   . LYMPHS ABS 2.940  1.0 - 4.8 (THOU/uL)   . MONOCYTES 8  4 - 13 (%)   . MONOS ABS 0.697  0.1 - 0.9 (THOU/uL)   . EOSINOPHIL 1  1 - 6 (%)   . EOS ABS 0.109  0.1 - 0.3 (THOU/uL)   . BASOPHILS 1  0 - 1 (%)   . BASOS ABS 0.047  0.0 - 0.2 (THOU/uL)   . NRBC'S 0  0 (/100WBC)   BASIC METABOLIC PANEL, NON-FASTING    Collection Time    01/24/10  2:15 AM   Component Value Range   . SODIUM 137  136 - 145 (mmol/L)   . POTASSIUM 4.2  3.5 - 5.1 (mmol/L)   . CHLORIDE 105  96 - 111 (mmol/L)   . CARBON DIOXIDE 26  22 - 32 (mmol/L)   . ANION GAP 6  5 - 16 (mmol/L)   . CREATININE 0.97  0.49 - 1.10 (mg/dL)   . ESTIMATED GLOMERULAR FILTRATION RATE >59  >59 (ml/min/1.55m2)   . GLUCOSE,NONFAST 102  65 - 139 (mg/dL)   . BUN 14  6 - 20 (mg/dL)   . BUN/CREAT RATIO 14  6 - 22    . CALCIUM 8.8  8.5 - 10.4 (mg/dL)   VANCOMYCIN, TROUGH     Collection Time    01/24/10  2:15 AM   Component Value Range   . VANCOMYCIN, TROUGH 16.0 (*) 5.0 - 10.0 (ug/mL)   . TIME DRAWN: 0210     POCT WHOLE BLOOD GLUCOSE    Collection Time    01/24/10  6:27 AM   Component Value Range   . GLUCOSE, POINT OF CARE 111 (*) 70 - 105 (mg/dL)         Nutrition/Residuals:  Regular    Prophylaxis:  Date Started Date Completed   DVT/PE  Not indicated     GI: Proton Pump inhibitor       Hardware (Lines, foley, tubes):   Date Placed  Date Changed   1.     2.     3.     4.     5.       Antibiotics: Day # Date Started Date Completed   1. Vancomycin  3     2. Unasyn  4     3. Moxyflox Drops       4.          Radiology Results: No new radiology      Microbiology:   Blood culture: No growth     Assessment/ Plan:   Active Hospital Problems   (*Primary Problem)   Diagnoses   . *Preseptal cellulitis     - started Unasyn 3g OD, Vancomycin 1.5g BID, and Moxifloxacin eye drops TID on 01/22/10  - will shift to  Augmentin 875mg  BID and Clindamycin 300 mg qid and discharge to home today.   -Will also discharge on the Vigamox drops (pharmacy states it will only cost her $3)     . Depression     Chronic     - Continue Cymbalta 60mg  OD     . GERD not well controlled     Chronic     EGD done by Dr Alto Denver 12/19/07 nonerosive GERD, erythema and patulous GE junction, continue PPI and antireflux measures.  Esophageal manometry 01/03/08 essentially normal. PH probe testing--abnormal off PPI  - Continue Nexium 40mg  BID         Disposition Planning: Home discharge      Kelli Churn, MD 01/24/2010, 12:32 PM      Late entry for 01/24/10. I saw and examined the patient.  I reviewed the resident's note.  I agree with the findings and plan of care as documented in the resident's note.  Any exceptions/additions are edited/noted.    Dagoberto Ligas, DO 01/25/2010, 8:26 AM

## 2010-01-24 NOTE — Discharge Summary (Addendum)
[facility]                                              DISCHARGE SUMMARY      PATIENT NAME:  [name], [name]   MRN:  [id number]  DOB:  05-05-1956    ADMISSION DATE:  01/20/2010  DISCHARGE DATE:  01/24/2010    ATTENDING PHYSICIAN: Dagoberto Ligas, DO  PRIMARY CARE PHYSICIAN: Theola Sequin, MD, MD     ADMISSION DIAGNOSIS: Preseptal cellulitis  DISCHARGE DIAGNOSIS:   1. Preseptal Cellulitis  2. Depression  3. GERD    Chronic Problems:   1. Microscopic Hematuria  2. Multinodular Goiter  3. Patellofemoral Pain Syndrome  4. Migraine Headaches                                                                                             DISCHARGE MEDICATIONS:  Current Discharge Medication List      START taking these medications       amoxicillin-pot clavulanate (AUGMENTIN) 875-125 mg Tab    take 1 Tab by mouth every 12 hours.    Qty: 14 Tab Refills: 0        Clindamycin (CLEOCIN) 300 mg Cap    take 1 Cap by mouth Four times a day.    Qty: 28 Cap Refills: 0          CONTINUE these medications which have NOT CHANGED       esomeprazole magnesium (NEXIUM) 40 mg CpDR    take 1 Cap by mouth Twice a day before meals.    Qty: 60 Cap Refills: 5        erythromycin (ROMYCIN) 5 mg/gram (0.5 %) Oint    by Right Eye route. Apply 4 times daily to right eye.    Qty: 1 Tube Refills: 0    Associated Diagnoses: Blepharitis of right eye        Diclofenac Sodium (VOLTAREN) 75 mg TbEC    take 1 Tab by mouth Twice daily.     Qty: 60 Refills: 4        methocarbamol (ROBAXIN) 500 mg Tab    take 1 Tab by mouth Three times a day. 1-2 tab by mouth 3 times daily for spasm     Qty: 42 Refills: 0    Associated Diagnoses: Neck pain; Left arm pain        duloxetine (CYMBALTA) 60 mg CpDR    take 1 Cap by mouth Once a day.     Qty: 1 month supply Refills: 1    Associated Diagnoses: Major depressive disorder, recurrent episode, in full remission        docusate sodium (COLACE) 100 mg Cap     take 1 Cap by mouth Twice daily.     Qty: 28 Refills: 0        ASTELIN 137 mcg SprA    2 Sprays by Nasal route Twice daily. Use in each nostril as directed  DISCHARGE INSTRUCTIONS:     SCHEDULE FOLLOW-UP FAMILY MEDICINE   Follow-up appointment with: FAMILY MEDICINE    Follow-up in: MONDAY    Reason for visit: HOSPITAL DISCHARGE    Followup reason: Hospital discharge follow up    Provider: Put on end of Jenyfer Trawick's schedule--this is OK with him.       DISCHARGE INSTRUCTION - MISC   Return to hospital if you experience fevers, or worse redness or pain.       REASON FOR HOSPITALIZATION AND HOSPITAL COURSE:  This is a 54 y.o., female who came to our service with complaints of redness, swelling, and pain around her right eye. Patient reported pain and blurry vision. She was seen at Urgent care, where a flourescene test was found to be negative for corneal abrasions. She was applying erythromycin ointment to the eye, with no success.    On examination, patient was found to have redness and swelling around the right eye. There was chemosis of the sclera, and induration beneath the eye. Patient was admitted to the family medicine service for presumed preseptal cellulitis, and was given Unasyn for treatment.    During the night of 3/30-3/31, patient complained of increase redness, swelling, and drainage from her right eye. A CT was performed, which showed evidence for cellulitis, but no evidence for orbital cellulitis. At that time, vancomycin was added to the regimen, as well as Moxifloxacin eye drops.    Patient was treated with IV Unasyn and Vancomycin through 01/24/10, at which time she was deemed suitable for discharge. She received 3 days of Vancomycin, and 4 days of Unasyn. Patient will be discharged to home with Augmentin, Clindamycin, and Moxifloxacin eye drops. She will complete a 7 day course of all these antibiotics. Patient will be seen in 2 days w/ Dr. Alphonsus Sias in clinic for follow up.      CONDITION ON DISCHARGE:  A. Ambulation: Independent  B. Self-care Ability: Full  C. Cognitive Status Alert and oriented    DISCHARGE DISPOSITION:  Home discharge     cc: Primary Care Physician:  Theola Sequin, MD  [facility] [address]  [facility] [id number]     [id number] Physician:  Raynelle Dick, MD  [facility] FAMILY [facility]  [address]  [place], [place] [address]       Kelli Churn, MD

## 2010-01-24 NOTE — Nurses Notes (Signed)
Blood pressure 160/84 on monitor. Rechecked manually 124/82.

## 2010-01-25 LAB — ADULT ROUTINE BLOOD CULTURE, SET OF 2 BOTTLES (BACTERIA AND YEAST)
CULTURE OBSERVATION: NO GROWTH
REPORT STATUS: 4032011
SPECIAL REQUESTS: 1

## 2010-01-26 ENCOUNTER — Encounter (INDEPENDENT_AMBULATORY_CARE_PROVIDER_SITE_OTHER): Payer: MEDICAID | Admitting: Gastroenterology-BA

## 2010-01-26 ENCOUNTER — Encounter (INDEPENDENT_AMBULATORY_CARE_PROVIDER_SITE_OTHER): Payer: Self-pay | Admitting: Family Medicine

## 2010-01-26 ENCOUNTER — Ambulatory Visit (INDEPENDENT_AMBULATORY_CARE_PROVIDER_SITE_OTHER): Payer: MEDICAID | Admitting: Family Medicine

## 2010-01-26 VITALS — BP 130/90 | HR 90 | Temp 98.0°F | Wt 202.0 lb

## 2010-01-27 NOTE — Progress Notes (Signed)
Subjective:     Patient ID:  [name] is an 54 y.o. female   Chief Complaint:  Chief Complaint   Patient presents with   . Hospital Discharge Follow Up         HPI  She has done well from her hospital f/u for preseptal cellulitis.  Her right eye continues to improve.  She is on augmentin, clindamycin and vigamox drops.    ROS  Objective:   Physical Exam   Constitutional: She is oriented.   Neurological: She is alert and oriented.   Skin:        Small scab on right lower eyelid.  Dramatic decrease in swelling and redness.  Conjucntiva near normal.   BP 130/90   Pulse 90   Temp 36.7 C (98 F)   Wt 91.627 kg (202 lb)   .    Assessment & Plan:     373.13G Preseptal cellulitis  (primary encounter diagnosis)  Comment: much improved  Plan: finish antibiotics and f/u with PCP as usual    Dagoberto Ligas, DO

## 2010-01-27 NOTE — Progress Notes (Signed)
I saw and evaluated the patient. I reviewed the resident's note. I agree with the findings and plan of care as documented in the resident's note. Any exceptions/additions are noted.      Charmaine Downs, MD 01/27/2010, 1:09 PM

## 2010-01-30 ENCOUNTER — Encounter (HOSPITAL_COMMUNITY): Payer: MEDICAID | Admitting: Psychiatry

## 2010-02-02 ENCOUNTER — Encounter (INDEPENDENT_AMBULATORY_CARE_PROVIDER_SITE_OTHER): Payer: Self-pay | Admitting: Gastroenterology-BA

## 2010-02-02 ENCOUNTER — Ambulatory Visit
Admission: RE | Admit: 2010-02-02 | Discharge: 2010-02-02 | Disposition: A | Discharge status: Home / Self Care | Payer: MEDICAID | Attending: Gastroenterology-BA | Admitting: Gastroenterology-BA

## 2010-02-02 ENCOUNTER — Ambulatory Visit (INDEPENDENT_AMBULATORY_CARE_PROVIDER_SITE_OTHER): Payer: MEDICAID | Admitting: Gastroenterology-BA

## 2010-02-02 DIAGNOSIS — R109 Unspecified abdominal pain: Secondary | ICD-10-CM | POA: Insufficient documentation

## 2010-02-02 LAB — LIPASE: LIPASE: 51 U/L (ref 6–51)

## 2010-02-02 LAB — AMYLASE: AMYLASE: 56 U/L (ref <101)

## 2010-02-02 NOTE — Progress Notes (Signed)
Requesting Physician: Charmaine Downs  History of Present Illness  [name] is a 54 y.o. female who presents with a chief complaint of Abdominal Pain and Heartburn   She previously was a patient of Dr. Kelle Darting and was diagnosed with GERD and dysphagia.  Her most recent EGD was 12/19/07 demonstrating erythema in the gastric antrum and a patulous GE junction zone. Her last 24-hour pH probe was 01/03/08, and her last manometry was 01/03/08.  She also had a colonoscopy (01/06/10) which demonstrated a few diverticula of the sigmoid colon.  She presented today due to worsening reflux which is now associated with epigastric pain.  The pain started approximately one week ago and always occurs with eating.  For this reason she has been very conservative in her food selection, even selecting jell-o to attempt to relieve symptoms.  She believes the pain is reproducible with palpation as well.  She reports following the GERD recommendations including quitting smoking and reducing coffee, fatty food, and NSAID intake.  Her reflux causes pain in her throat and leaves her hoarse.  She sleeps with head elevated to prevent reflex during sleep.  She denies weight loss/ N/V, however she states her BM's have increased to three times per day.  She denies melena or hematochezia.  Her dysphagia is being followed by ENT, who attribute the globus sensation to enlarged thyroid.  She states that her swallowing difficulties have not changed in the past two years.       Past History  Current outpatient prescriptions   Medication Sig   . amoxicillin-pot clavulanate (AUGMENTIN) 875-125 mg Tab take 1 Tab by mouth every 12 hours.   . Clindamycin (CLEOCIN) 300 mg Cap take 1 Cap by mouth Four times a day.   . moxifloxacin (VIGAMOX) 0.5 % Drop 1 Drop by Both Eyes route Three times a day.   . esomeprazole magnesium (NEXIUM) 40 mg CpDR take 1 Cap by mouth Twice a day before meals.    Marland Kitchen erythromycin (ROMYCIN) 5 mg/gram (0.5 %) Oint by Right Eye route. Apply 4 times daily to right eye.   . Diclofenac Sodium (VOLTAREN) 75 mg TbEC take 1 Tab by mouth Twice daily.    . methocarbamol (ROBAXIN) 500 mg Tab take 1 Tab by mouth Three times a day. 1-2 tab by mouth 3 times daily for spasm    . duloxetine (CYMBALTA) 60 mg CpDR take 1 Cap by mouth Once a day.    . docusate sodium (COLACE) 100 mg Cap take 1 Cap by mouth Twice daily.    . ASTELIN 137 mcg SprA 2 Sprays by Nasal route Twice daily. Use in each nostril as directed       Allergies   Allergen Reactions   . Codeine Itching     RASH   . Meloxicam Nausea/ Vomiting       Past Medical History   Diagnosis Date   . depression    . Multinodular goiter    . Headache      Migraines   . Arthritis    . GERD (gastroesophageal reflux disease)    . Vaginal prolapse    . Rheumatic fever        Past Surgical History   Procedure Date   . Pb upper gi endoscopy,biopsy 12/19/07     patulous GE junction zone, erythema, nonerosive GERD   . Hx ankle fracture tx 2007     left distal fibula, casted   . Hx wrist fracture tx 2007  FOOSH injury   . Hx partial hysterectomy    . Colonoscopy 01/06/09     COLONOSCOPY performed by Augustin Schooling F at [facility] OR ENDO   . Hx cystocele repair 09/24/09   . Hx cholecystectomy    . Hx adenoidectomy    . Hx tonsillectomy        Family History  Family History   Problem Relation Age of Onset   . Congestive Heart Failure Father    . Heart Attack Sister      85   . Hypertension Brother    . Hypertension Sister    . Hypertension Sister    . Kidney Disease Sister      ESRD   . Stroke Paternal Grandmother    . Diabetes Mother    . Thyroid Disease Sister        Social History  History   Social History   . Marital Status: Divorced     Spouse Name: N/A     Number of Children: N/A   . Years of Education: N/A   Occupational History   . 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 Smoking status: Former Smoker -- 2.0 packs/day for 40 years     Types: Cigarettes     Quit date: 01/19/2005   . Smokeless tobacco: Not on file    Comment: quit 2007   . Alcohol Use: No   . Drug Use: No   . Sexually Active: Not on file   Other Topics Concern   . Not on file   Social History Narrative   . No narrative on file       Review of Systems    Constitutional: negative for fevers, chills, sweats and weight loss  Eyes: positive for recent right eye infection, negative for visual disturbance  Ears, nose, mouth, throat, and face: positive for hoarseness and globus sensation  Respiratory: negative for cough, asthma or wheezing  Cardiovascular: negative for chest pain, dyspnea and syncope  Gastrointestinal: positive for dysphagia, reflux symptoms and change in bowel habits, negative for nausea, vomiting and melena  Genitourinary:negative for frequency and dysuria  Integument/breast: negative  Hematologic/lymphatic: negative  Musculoskeletal:negative  Neurological: negative  Behavioral/Psych: negative  Endocrine: negative for diabetic symptoms including polyuria, polydipsia and weight loss and temperature intolerance  Allergic/Immunologic: negative    Examination  Vitals: BP 126/70   Pulse 108   Temp 36.7 C (98.1 F)   Ht 1.651 m (5\' 5" )   Wt 93.804 kg (206 lb 12.8 oz)   BMI 34.41 kg/m2   SpO2 97%  General: mildly obese, appears stated age and no distress  Eyes: Conjunctiva clear., Sclera non-icteric.   HENT:ENT without erythema or injection, mucous membranes moist.  Neck: supple, symmetrical, trachea midline and no adenopathy  Lungs: clear to auscultation bilaterally.   Cardiovascular:    Heart regular rate and rhythm, S1, S2 normal, no murmur, click, rub or gallop  Abdomen: bowel sounds normal, non-distended and palpation: Tenderness: epigastric  Extremities: no cyanosis or edema  Skin: Skin warm and dry  Neurologic: grossly normal and alert and oriented x3  Lymphatics: no lymphadenopathy  Psychiatric: normal   Impression  1. Dysphonia (784.42)    2. 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 Abdominal pain (789.00AP)        Recommendations:   R/O peptic ulcer disease  -Increase nexium to 40mg  BID.  Patient is waiting on insurance to approve increase in dose.  -Plan  to repeat EGD if no improvement in symptoms with bid dose.  -Amylase and Lipase  -F/U in 3 months    Stanford Scotland, MED STUDENT 02/02/2010    I saw and evaluated the patient. I reviewed the resident's /fellow's note and agree with the documented findings and plan of care.   Knox Saliva, MD   Assistant Professor of Medicine   Section of Digestive Diseases   [facility]

## 2010-02-11 ENCOUNTER — Ambulatory Visit (INDEPENDENT_AMBULATORY_CARE_PROVIDER_SITE_OTHER): Payer: MEDICAID | Admitting: Psychiatry

## 2010-02-11 MED ORDER — DULOXETINE 60 MG CAPSULE,DELAYED RELEASE
60.0000 mg | DELAYED_RELEASE_CAPSULE | Freq: Every day | ORAL | Status: DC
Start: 2010-02-11 — End: 2010-03-25

## 2010-02-11 NOTE — Progress Notes (Signed)
[facility]  [address]  [place] [address]  Dept: [phone]  Loc: [phone]        CHIEF COMPLAINT:  Medication refill.      SUBJECTIVE:   [name] is a 54 y.o. female from [place], [place] who comes for a follow up. She was last evaluated on 12/05/09 for MDD.  At that time, she was continued on Cymbalta 60 mg daily.    She has been taking her medication as prescribed and denied having any side effects. She complained about worsening bilateral knee pain because of arthritis. The patient stated that has been worried about whether or not she has cancer because of a mass on her neck. She was told that an MRI could help differentiate between benign or malignant mass, that the insurance would not approve the test. She has been trying to cope with her current situation and other health problems. Still, she is concerned and wonders what else needs to be done. She has interrupted sleep, waking up 2-3 times at night. She is eating well and has a fair energy level. She denied any crying spells. She denied SI. She just finished her second online course and believes she passed it.        OBJECTIVE:  Orientation: x3  Appearance: Patient looks stated age.  Hygiene: Good.  Grooming: Good.  Psychomotor: No agitation or retardation.  Speech: Normal rate, volume and tone.  Eye contact: Good.  Mood: "ok".  Affect: broad.  Thought process: Linear.  Thought content: Negative for delusions, paranoia, obsessions.  Perceptual disturbances: No hallucinations.  Suicidal ideation: Denied.  Homicidal ideation: Denied.  Memory:  grossly intact.  Insight: Good  Judgment: Good.      MEDICATION LIST:    Outpatient prescriptions prior to encounter:  amoxicillin-pot clavulanate (AUGMENTIN) 875-125 mg Tab take 1 Tab by mouth every 12 hours.   Clindamycin (CLEOCIN) 300 mg Cap take 1 Cap by mouth Four times a day.   moxifloxacin (VIGAMOX) 0.5 % Drop 1 Drop by Both Eyes route Three times a day.   esomeprazole  magnesium (NEXIUM) 40 mg CpDR take 1 Cap by mouth Twice a day before meals.   erythromycin (ROMYCIN) 5 mg/gram (0.5 %) Oint by Right Eye route. Apply 4 times daily to right eye.   Diclofenac Sodium (VOLTAREN) 75 mg TbEC take 1 Tab by mouth Twice daily.    methocarbamol (ROBAXIN) 500 mg Tab take 1 Tab by mouth Three times a day. 1-2 tab by mouth 3 times daily for spasm    duloxetine (CYMBALTA) 60 mg CpDR take 1 Cap by mouth Once a day.    docusate sodium (COLACE) 100 mg Cap take 1 Cap by mouth Twice daily.    ASTELIN 137 mcg SprA 2 Sprays by Nasal route Twice daily. Use in each nostril as directed         ASSESSMENT/PLAN:  1. Major depressive disorder, recurrent episode, in full remission (296.36)  duloxetine (CYMBALTA) 60 mg CpDR         MDD -Continue current medication. Monitor for sleep disturbances. She was advised to call if she had concerns, or experienced side effects from the medications. Follow up in March 25, 2010, per patient request.       Marletta Lor, MD

## 2010-02-13 ENCOUNTER — Ambulatory Visit (INDEPENDENT_AMBULATORY_CARE_PROVIDER_SITE_OTHER): Payer: MEDICAID | Admitting: ORTHOPEDIC, SPORTS MEDICINE

## 2010-02-13 NOTE — Progress Notes (Signed)
Name: [name]  MEDICAL RECORD [id number]  DATE OF BIRTH: [birth year]  DATE OF SERVICE: 02/13/2010    SUBJECTIVE:  [name] presents to clinic today for follow up of her   right knee.  [name] has not gone to physical therapy because of recent sever eye infection for which [name] was hospitalized.  Got releif with the injections, but starting to have return of pain on right knee.    The patient denies effusions, locking or instability symptoms of the knee.    PAST MEDICAL HISTORY  Reviewed and updated    MEDICATIONS  Current outpatient prescriptions   Medication Sig   . duloxetine (CYMBALTA) 60 mg CpDR take 1 Cap by mouth Once a day.   Marland Kitchen amoxicillin-pot clavulanate (AUGMENTIN) 875-125 mg Tab take 1 Tab by mouth every 12 hours.   . Clindamycin (CLEOCIN) 300 mg Cap take 1 Cap by mouth Four times a day.   . moxifloxacin (VIGAMOX) 0.5 % Drop 1 Drop by Both Eyes route Three times a day.   . esomeprazole magnesium (NEXIUM) 40 mg CpDR take 1 Cap by mouth Twice a day before meals.   Marland Kitchen erythromycin (ROMYCIN) 5 mg/gram (0.5 %) Oint by Right Eye route. Apply 4 times daily to right eye.   . Diclofenac Sodium (VOLTAREN) 75 mg TbEC take 1 Tab by mouth Twice daily.    . methocarbamol (ROBAXIN) 500 mg Tab take 1 Tab by mouth Three times a day. 1-2 tab by mouth 3 times daily for spasm    . docusate sodium (COLACE) 100 mg Cap take 1 Cap by mouth Twice daily.    . ASTELIN 137 mcg SprA 2 Sprays by Nasal route Twice daily. Use in each nostril as directed       ALLERGIES  Allergies   Allergen Reactions   . Codeine Itching     RASH   . Mobic (Meloxicam) Nausea/ Vomiting         REVIEW OF SYSTEMS  As above, otherwise all other systems are essentially negative.    PHYSICAL EXAM  Cing Raleigh is a pleasant individual in no acute distress with appropriate mood and affect.  [name] is alert and oriented times three.  Respirations are unlabored.  Pulses palpable and equal distally.    Skin is warm and dry to touch.       On physical examination of the patient's bilateral knee, .    Range of Motion: full  Swelling: none  Tenderness to Palpation: right pes bursa severely TTP  Lachman's: negative  Posterior drawer: negative  Nigel Berthold Manuever: negative    Neurologic: intact  Gait: wnl    RADIOGRAPHY:  .     ASSESSMENT: 54 y.o. year old female with bilateral knee pain and pes anserine bursitis.    PLAN:  At today's visit, we discussed treatment options concerning patient's knee.  Will start PT for both knees emphasis on modalities pes bursa on right. Continue voltaren [name]  will f/u in clinic in 1 month.  If the patient has any questions or concerns, [name] can contact our office.      [name], MD 02/13/2010, 3:15 PM

## 2010-02-19 NOTE — Progress Notes (Signed)
I saw and evaluated the patient. I reviewed the resident's note. I agree with the findings and plan of care as documented in the resident's note. Any exceptions/additions are noted.      Bernisha Verma, MD 02/19/2010, 1:36 PM

## 2010-03-04 ENCOUNTER — Ambulatory Visit (HOSPITAL_COMMUNITY): Payer: Self-pay | Admitting: Psychiatry

## 2010-03-04 NOTE — Telephone Encounter (Addendum)
03/04/2010  6:30 PM    Done. I faxed the letter.    Marletta Lor, MD      Message copied by Marletta Lor on Wed Mar 04, 2010  6:30 PM  ------       Message from: Barkley Bruns       Created: Mon Mar 02, 2010 11:39 AM         >> Barkley Bruns 03/02/2010 11:39 AM       Patient states you had called her earlier and asked her to get a fax number for you. She called the insurance company and was told this information could not be provided to the member and would give it to a physician only. Retal states the insurance company's phone number is [phone].

## 2010-03-05 ENCOUNTER — Other Ambulatory Visit (HOSPITAL_COMMUNITY): Payer: Self-pay | Admitting: Gastroenterology-BA

## 2010-03-05 ENCOUNTER — Encounter (HOSPITAL_COMMUNITY): Payer: Self-pay

## 2010-03-05 ENCOUNTER — Inpatient Hospital Stay
Admission: RE | Admit: 2010-03-05 | Discharge: 2010-03-05 | Disposition: A | Discharge status: Home / Self Care | Payer: MEDICAID | Attending: Gastroenterology-BA | Admitting: Gastroenterology-BA

## 2010-03-05 ENCOUNTER — Encounter (HOSPITAL_COMMUNITY)
Admission: RE | Disposition: A | Discharge status: Home / Self Care | Payer: Self-pay | Source: Ambulatory Visit | Attending: Gastroenterology-BA

## 2010-03-05 ENCOUNTER — Encounter (HOSPITAL_COMMUNITY): Payer: MEDICAID | Admitting: Gastroenterology-BA

## 2010-03-05 HISTORY — PX: GASTROSCOPY: WVUENDOPRO49

## 2010-03-05 HISTORY — PX: GASTROSCOPY WITH BIOPSY: WVUENDOPRO54

## 2010-03-05 SURGERY — GASTROSCOPY
Anesthesia: IV Sedation | Site: Mouth | Laterality: Left | Wound class: Clean Contaminated Wounds-The respiratory, GI, Genital, or urinary

## 2010-03-05 MED ORDER — SODIUM CHLORIDE 0.9 % INTRAVENOUS SOLUTION
INTRAVENOUS | Status: DC
Start: 2010-03-05 — End: 2010-03-06

## 2010-03-05 MED ORDER — FENTANYL (PF) 50 MCG/ML INJECTION SOLUTION
50.00 ug | INTRAMUSCULAR | Status: DC | PRN
Start: 2010-03-05 — End: 2010-03-05
  Administered 2010-03-05: 25 ug via INTRAVENOUS
  Administered 2010-03-05: 50 ug via INTRAVENOUS

## 2010-03-05 MED ORDER — MIDAZOLAM 1 MG/ML INJECTION SOLUTION
1.00 mg | INTRAMUSCULAR | Status: DC | PRN
Start: 2010-03-05 — End: 2010-03-05
  Administered 2010-03-05 (×2): 1 mg via INTRAVENOUS
  Administered 2010-03-05: 2 mg via INTRAVENOUS

## 2010-03-05 MED ORDER — LIDOCAINE HCL 2 % MUCOSAL SOLUTION
15.00 mL | Freq: Once | Status: DC | PRN
Start: 2010-03-05 — End: 2010-03-05
  Administered 2010-03-05: 15 mL via ORAL
  Filled 2010-03-05: qty 1

## 2010-03-05 MED ORDER — FENTANYL (PF) 50 MCG/ML INJECTION SOLUTION
INTRAMUSCULAR | Status: AC
Start: 2010-03-05 — End: 2010-03-05
  Filled 2010-03-05: qty 4

## 2010-03-05 SURGICAL SUPPLY — 72 items
BASIN EME 8.4X3.8X2IN GRAD DISP DST ROSE POLYPROP C500ML LF (PATU) IMPLANT
BLOCK BITE 20MM PE ADULT MOUTHPC STRAP RETENTION RIM LUM SCPSVR LF  LRG 27MM GRN NONST DISP (AIR) ×1 IMPLANT
BLOCK BITE 27FR INFANT BITEBLOCS PEDIABLOC LF  DISP (AIR) IMPLANT
BRUSH CYTO 230CM 2.4MM INFNT ROT HNDL (SURGICAL INSTRUMENTS) IMPLANT
CANNULA INJ 17GA NDLS SYRG BLUNT STRL LF  10ML BD INTRLNK PLASTIC BXTR INTLNK ABT LS MCGAW SAFELINE (IV TUBING & ACCESSORIES) IMPLANT
CANNULA INJ 20GA 17GA 2 DEV HUB CAP BLUNT STRL LF  RD GRN BD TWINPAK STL PLASTIC (IV TUBING & ACCESSORIES) ×2 IMPLANT
CANNULA NASAL 14FT ANGL FLXB LIP PLATE CRSH RS LUM TUBE NFLR TP ADULT ARLF UCIT STD CURVE LF  DISP (CANNULA) IMPLANT
CANNULA NASAL 7FT ANGL FLXB LIP PLATE CRSH RS LUM TUBE FLR TIP ADULT ARLF UCIT STD CURVE LF  DISP (CANNULA) IMPLANT
CANNULA NASAL 7FT ANGL FLXB LI_P PLATE CRSH RS LUM TUBE FLR (CANNULA)
CATH CRE 7.5FR 12-15MM 5.5CM 240CM WG BAL DIL ESOPH COLON PYL PEBAX STRL LF  2.8MM WRK CHNL F/G (Dilators) IMPLANT
CATH CRE 7.5FR 15-18MM 5.5CM 240CM WG BAL DIL ESOPH COLON PYL PEBAX STRL LF  2.8MM WRK CHNL F/G (Dilators) IMPLANT
CATH ELHMST GLD PROBE 10FR 300CM BIPOLAR RND DIST TIP STD CONN FIRM SHAFT HMGLD STRL DISP 3.7MM MN (DIAGNOSTIC) IMPLANT
CATH ELHMST GLD PROBE 7FR 300CM BIPOLAR RND DIST TIP STD CONN FIRM SHAFT HMGLD STRL DISP 2.8MM MN (DIAGNOSTIC) IMPLANT
CATH SUCT ARLF TRIFLO 18FR 2 3ANG EYE BVL TIP CONN CONTROL PORT STRL LF  DISP CLR (Suction) IMPLANT
CLIP HMST RADOPQ PRELD STRL DISP RSL 235CM 2.8MM 11MM OPN (SURGICAL INSTRUMENTS) IMPLANT
CONV USE ITEM 343591 - SOLIDIFY FLUID 1500ML DSPNSR L_Q TX SOLIDIFY SFTP LTS+ DISP (STER) IMPLANT
DEVICE INFNT STFR BRSTL CYTO 2.4MM 230CM STRL LF  DISP (BIOMEDICAL) IMPLANT
DEVICE SPEC RETR TLN 2.5MM 160CM 4 PRONG GRASPER INWRD HOOK SHEATH SS DISP (ENDOSCOPIC SUPPLIES) IMPLANT
DILATOR ENDOS CRE 180CM 5.5CM 10-11-12MM 7.5FR ESOPH PYL COLON BAL LOW PROF GW PEBAX STRL LF  DISP (BALLOON) IMPLANT
DILATOR ENDOS CRE 180CM 5.5CM 12-13.5-15MM 7.5FR ESOPH PYL COLON BAL LOW PROF GW PEBAX STRL LF  DISP (BALLOON) IMPLANT
DILATOR ENDOS CRE 180CM 5.5CM 6-7-8MM 7.5FR ESOPH PYL BIL BAL LOW PROF GW PEBAX STRL DISP 2.8MM 3.2 (BALLOON) IMPLANT
DILATOR ENDOS CRE 180CM 5.5CM 7.5FR 8-9-10MM ESPH BIL PYL GW BAL LF  DISP ACPT .035IN GW (BALLOON) IMPLANT
DILATOR ENDOS CRE 180CM 5.5CM_7.5FR 8-9-10MM ESPH BIL PYL GW (BALLOON)
DILATOR ENDOS CRE 180CM 8CM 10-11-12MM 6FR ESOPH BAL LOW PROF FIX WRE PEBAX STRL LF  DISP 2.8MM (BALLOON) IMPLANT
DILATOR ENDOS CRE 180CM 8CM 15-16.5-18MM 6FR ESOPH BAL LOW PROF FIX WRE PEBAX STRL LF  DISP 2.8MM (BALLOON)
DILATOR ENDOS CRE 180CM 8CM 6-7-8MM 6FR ESOPH BAL LOW PROF FIX WRE PEBAX STRL LF  DISP 2.8MM (BALLOON) IMPLANT
DILATOR ENDOS CRE 180CM 8CM 618-19-20MM 6FR ESOPH PYL FIX WRE BAL PEBAX STRL DISP ACPT .035IN GW 2.8 (BALLOON) IMPLANT
DILATOR ENDOS CRE 180CM 8CM 61_8-19-20MM 6FR ESOPH PYL FIX (BALLOON)
DILATOR ENDOS CRE 180CM 8CM 6FR 12-13.5-15MM ESOPH FIX WRE BAL RND SHLDR PEBAX STRL LF  DISP (GI LAB SUPPLIES) IMPLANT
DILATOR ENDOS CRE 180CM 8CM 8-9-10MM 6FR ESOPH BAL LOW PROF FIX WRE PEBAX STRL LF  DISP 2.8MM (GI LAB SUPPLIES) IMPLANT
DILATOR ENDOS CRE 180CM 8CM 8-_9-10MM 6FR ESOPH BAL LOW PROF (GI LAB SUPPLIES)
DISC USE 402689 - ROTH NET 2.5MM X 160CM STD_00711053 BX/5 (Dilators) IMPLANT
DISCONTINUED NO SUB - JELLY LUB DYNALUBE BCTRST WATER SOL NGRS PKT STRL 5GM LF (WOUND CARE SUPPLY) ×2 IMPLANT
DISCONTINUED USE ITEM 339015 - CONTAINR STRL 10% NEUT BF FRMLN POLYPROP GRAD LEAK RST ORNG PREFL SCREW CAP FSHR HLTHCR PRTCL GRN (CHEM) ×2 IMPLANT
DISCONTINUED USE ITEM 82101 - TUBING OXYGEN 50/CS 001302 (TUBE/TUBING & SUCTION SUPPLIES) IMPLANT
DUPE USE ITEM 301092 - DILATOR ENDOS CRE 180CM 8CM 15-16.5-18MM 6FR ESOPH BAL LOW PROF FIX WRE PEBAX STRL LF  DISP 2.8MM (BALLOON) IMPLANT
ELECTRODE PATIENT RTN 9FT VLAB C30- LB RM PHSV ACRL FOAM CORD NONIRRITATE NONSENSITIZE ADH STRP (CAUTERY SUPPLIES) IMPLANT
FILTER PROBE SIDE FIRE 2.3MM 20132-217 INTEGRATED BX/10 (Dilators) IMPLANT
FORCEPS BIOPSY 160CM 1.8MM RJ 4 PED 2+ MM DISP GASTROSCOPIC (SURGICAL INSTRUMENTS) IMPLANT
FORCEPS BIOPSY HOT 240CM 2.2MM RJ 4 +2.8MM DISP (INSTRUMENTS)
FORCEPS BIOPSY HOT 240CM 2.2MM RJ 4 +2.8MM DISPO (SURGICAL INSTRUMENTS) IMPLANT
FORCEPS BIOPSY MICROMESH TTH STREAMLINE CATH 240CM 2.4MM RJ 4 SS LRG CPC STRL DISP ORNG 2.8MM WRK (GUIDING) IMPLANT
FORCEPS BIOPSY NEEDLE 240CM 2.2MM RJ 4 2.8MM STD CPC STRL DISP ORNG (SURGICAL INSTRUMENTS) ×1 IMPLANT
FORCEPS BIOPSY NEEDLE 240CM 2._2MM RJ 4 2.8MM STD CPC STRL (INSTRUMENTS) ×1
FORCEPS SPEC RETR 240CM 2.3MM 15MM TFLN SS 3 RING HNDL 3 PRONG MONOF GRSP FB STRL LF  DISP (ENDOSCOPIC SUPPLIES) IMPLANT
INK ENDOSCOPIC MARKER SPOT 5ML GIS44 STERILE 10EA/BX (MISCELLANEOUS PT CARE ITEMS) IMPLANT
LIGATOR 122MM 9.5-13MM 6SHTR SA 6 BAND TRGR CORD NONST DISP ENDOS ESOPH VARICES NATURAL RUB LTX (SUTURE STAPLING DEVICES) IMPLANT
LINER SUCT RD CRD MEDIVAC TW LOCK LID SHTOF VALVE CAN FILTER 1500CC LF  DISP (Suction) ×1 IMPLANT
LOOP ENDO DETACH 20MM MAJ340 (GU) IMPLANT
LOOP HLDR DTCH AUTOCLAV 30MM SURG NYL PLPCTM NONST LF  DISP (ENDOSCOPIC SUPPLIES) IMPLANT
NEEDLE SCLRTX 25GA 2.3MM OPTC TIP SHTH STRL LF DISP YW (NEEDLES & SYRINGE SUPPLIES) IMPLANT
NEEDLE SCLRTX 25GA 2.5MM INJ LL SPRG LD HNDL SHEATH LF  CRLK 230CM 5MM SS TFLN (Other Miscellaneous) IMPLANT
NET SPEC RETR 160CM 3MM RTHNT MAXI SHEATH 8X4CM NONST LF  DISP (Dilators) IMPLANT
NET SPEC RETR 230CM 2.5MM RTHNT STD SHEATH 6X3CM NONST LF  DISP (Dilators) IMPLANT
OVERTUBE ENDOS 25CM 19.5MM 8.6-10MM 16.7MM INSFL CAP GUARDUS STD TAPER ESPH NONST LF  DISP (AIR) IMPLANT
OVERTUBE ENDOS 50CM 19.5MM 8.6-10MM 16.7MM TAPER TIP INSFL CAP GUARDUS GASTRIC LF (ENDOSCOPIC SUPPLIES) IMPLANT
PROBE ESURG 220CM 2.3MM FIAPC FLXB STR FIRE STRL DISP (CAUTERY SUPPLIES) IMPLANT
RETRIEVER ENDOS 160CM 1.8MM RTHNT MINI SM CATH SHEATH 4.5X2CM NONST (Dilators) IMPLANT
SET IV UNIV EXT DUAL Y W/SLIDE CLMP NEEDLELESS 2C6612 (IV TUBING & ACCESSORIES) IMPLANT
SNARE SM OVAL 240CM 2.4MM SNS LOOP SHRTHRW FLXB ENDOS PLPCTM 13MM STRL LF  DISP (DIAGNOSTIC) IMPLANT
SOLIDIFY FLUID 1500ML DSPNSR L_Q TX SOLIDIFY SFTP LTS+ DISP (STER)
SOLUTION IRRG H2O 500CC 2F7113 (SOLUTIONS) ×1
SYRINGE 5ML LF  STRL ST GRAD MED POLYPROP DISP (NEEDLES & SYRINGE SUPPLIES) ×1 IMPLANT
SYRINGE INFLAT ALN II GA STRL DISP 60ML (NEEDLES & SYRINGE SUPPLIES) IMPLANT
SYRINGE LL 3ML LF  STRL GRAD N-PYRG DEHP-FR PVC FREE MED DISP CLR (NEEDLES & SYRINGE SUPPLIES) ×1 IMPLANT
SYRINGE LL 50ML LF  STRL GRAD N-PYRG DEHP-FR PVC FREE MED DISP CLR (NEEDLES & SYRINGE SUPPLIES) IMPLANT
TRAP MUCOUS SPEC 10FR MST4000 (ANETHESIA SUPPLIES) IMPLANT
TRAY GASTRIC LAV 36IN 24FR ARGYLE EDLICH MONOJECT MED PVC 4 EYE CLS END GRAD SYRG TRNSPR 140CC PED (TRAY) IMPLANT
TRAY GASTRIC LAV 36IN 34FR ARGYLE EDLICH MONOJECT LRG PVC 4 EYE CLS END GRAD SYRG TRNSPR 140CC NONST (TRAY) IMPLANT
TUBING SUCT CLR 20FT 9/32IN MEDIVAC NCDTV M/M CONN STRL LF (Suction) IMPLANT
TUBING SUCT CLR 6FT 3/16IN MEDIVAC MXGR MALE TO MALE CONN NCDTV STRL LF (Suction) ×1 IMPLANT
WATER STRL 500ML PLASTIC PR BTL LF (SOLUTIONS) ×1 IMPLANT

## 2010-03-05 NOTE — Discharge Instructions (Signed)
SURGICAL DISCHARGE INSTRUCTIONS     Dr. Swati Pawa, MD  performed your GASTROSCOPY, GASTROSCOPY WITH BIOPSY today at the [facility]    [facility]:  Monday through Friday from 6 a.m. - 7 p.m.: [phone]  Between 7 p.m. - 6 a.m., weekends and holidays:  Call [facility] at [phone] or [phone].    PLEASE SEE WRITTEN HANDOUTS AS DISCUSSED BY YOUR NURSE:      SIGNS AND SYMPTOMS OF A WOUND / INCISION INFECTION   Be sure to watch for the following:  · Increase in redness or red streaks near or around the wound or incision.  · Increase in pain that is intense or severe and cannot be relieved by the pain medication that your doctor has given you.  · Increase in swelling that cannot be relieved by elevation of a body part, or by applying ice, if permitted.  · Increase in drainage, or if yellow / green in color and smells bad. This could be on a dressing or a cast.  · Increase in fever for longer than 24 hours, or an increase that is higher than 101 degrees Fahrenheit (normal body temperature is 98 degrees Fahrenheit). The incision may feel warm to the touch.    **CALL YOUR DOCTOR IF ONE OR MORE OF THESE SIGNS / SYMPTOMS SHOULD OCCUR.    ANESTHESIA INFORMATION   ANESTHESIA -- ADULT PATIENTS:  You have received intravenous sedation / general anesthesia, and you may feel drowsy and light-headed for several hours. You may even experience some forgetfulness of the procedure. DO NOT DRIVE A MOTOR VEHICLE or perform any activity requiring complete alertness or coordination until you feel fully awake in about 24-48 hours. Do not drink alcoholic beverages for at least 24 hours. Do not stay alone, you must have a responsible adult available to be with you. You may also experience a dry mouth or nausea for 24 hours. This is a normal side effect and will disappear as the effects of the medication wear off.    REMEMBER   If you experience any difficulty breathing, chest pain, bleeding that you  feel is excessive, persistent nausea or vomiting or for any other concerns:  Call your physician Dr. Pawa at [phone] or [phone]. You may also ask to have the GI doctor on call paged. They are available to you 24 hours a day.    SPECIAL INSTRUCTIONS / COMMENTS   Instructions for Upper Endoscopy:  Patient Information:  No driving until tomorrow, You may experience a sore throat and gas or bloating for 24 hours, Use an antacid for mild discomfort and Call in one week for biopsy results- GI Office [phone].  REMEMBER TO:  Call your physician for any of the following symptoms:  1.) Persistent vomiting or vomiting bright red blood  2.) Fever (temperature greater than 100°F)  3.) Any difficulty breathing.      FOLLOW-UP APPOINTMENTS   Please call patient services at [phone] or [phone] to schedule a date / time of return. They are open Monday - Friday from 7:30 am - 5:00 pm.

## 2010-03-05 NOTE — OR Nursing (Signed)
Abdomen soft and nontender.  Patient had no complaints of pain post procedure.

## 2010-03-05 NOTE — OR PostOp (Signed)
Pt ambulated in room to get dressed.  Pt tolerated well.

## 2010-03-05 NOTE — OR PostOp (Signed)
Patient discharged with belongings via wheelchair by D. Hoover, PCA.

## 2010-03-05 NOTE — OR PreOp (Signed)
Admitted to Area C  for pre op assesment - Reviewed history and allergies with   patient          -

## 2010-03-05 NOTE — OR Nursing (Signed)
Abdomen soft and tender. Patient complains of abdominal pain.  Patient rates pain a 7 out of 10 pre procedure.

## 2010-03-06 ENCOUNTER — Encounter (HOSPITAL_COMMUNITY): Payer: Self-pay | Admitting: Gastroenterology-BA

## 2010-03-06 LAB — HISTORICAL SURGICAL PATHOLOGY SPECIMEN

## 2010-03-17 ENCOUNTER — Ambulatory Visit
Admission: RE | Admit: 2010-03-17 | Discharge: 2010-03-17 | Disposition: A | Discharge status: Home / Self Care | Payer: MEDICAID | Attending: Orthopaedic Surgery of the Spine | Admitting: Orthopaedic Surgery of the Spine

## 2010-03-25 ENCOUNTER — Ambulatory Visit (INDEPENDENT_AMBULATORY_CARE_PROVIDER_SITE_OTHER): Payer: MEDICAID | Admitting: Psychiatry

## 2010-03-25 MED ORDER — DULOXETINE 60 MG CAPSULE,DELAYED RELEASE
60.0000 mg | DELAYED_RELEASE_CAPSULE | Freq: Every day | ORAL | Status: DC
Start: 2010-03-25 — End: 2010-06-17

## 2010-03-25 NOTE — Progress Notes (Signed)
[facility]  [address]  [place] [address]  Dept: [phone]  Loc: [phone]        CHIEF COMPLAINT:  Follow up.      SUBJECTIVE:   [name] is a 54 y.o. female from [place], [place] who comes for a follow up. She was last evaluated on 02/11/2010 for MDD, recurrent, in remission.  On her last appointment, she was continued on Cymbalta 60 mg daily.  She has been taking her medication as prescribed and denied having any side effects.     She denied having symptoms related to mania, depression, anxiety or psychosis. She is awaiting results from biopsy of antrum and of distal esophagus for H. Pylori. She had a neck MRI 3-4 days ago to rule out malignancy and the results are also pending. She continues to experience numbness/pain and tingling sensation in hands and feet. She is receiving steroid injections on both knees for arthritis treatment. She is going to [place] on June 14-20, 2011 for her son's wedding. She is going to have a new grand kid. She continues taking online courses.      OBJECTIVE:  Orientation: x3  Appearance: Patient looks stated age.  Hygiene: Good.  Grooming: Good.  Psychomotor: No agitation or retardation.  Speech: Normal rate, volume and tone.  Eye contact: Good.  Mood: euthymic.  Affect: congruent.  Thought process: Linear.  Thought content: Negative for delusions, paranoia, obsessions.  Perceptual disturbances: No hallucinations.  Suicidal ideation: Denied.  Homicidal ideation: Denied.  Memory:  grossly intact.  Insight: Good  Judgment: Good.      MEDICATION LIST:    Outpatient prescriptions prior to encounter:  duloxetine (CYMBALTA) 60 mg CpDR take 1 Cap by mouth Once a day.   esomeprazole magnesium (NEXIUM) 40 mg CpDR take 1 Cap by mouth Twice a day before meals.   Diclofenac Sodium (VOLTAREN) 75 mg TbEC take 1 Tab by mouth Twice daily.    docusate sodium (COLACE) 100 mg Cap take 1 Cap by mouth Twice daily.    ASTELIN 137 mcg SprA 2 Sprays by Nasal route  Twice daily. Use in each nostril as directed         ASSESSMENT/PLAN:  1. Major depressive disorder, recurrent episode, in full remission (296.36)  duloxetine (CYMBALTA) 60 mg CpDR       Axis I: Major Depressive disorder, recurrent in remission  Axis II: None  Axis III: rectocele repair, bilateral knee arthritis, neck mass, GERD, pending r/o H. Pylori, history of multinodular goiter, migraines  Axis IV: medical illnesses and personal stressors.   Axis V: GAF: 65      MDD -Continue current medication. Monitor for sleep disturbances. She was advised to call if she had concerns, or experienced side effects from the medications. Follow up in 2 months, per patient request.      Marletta Lor, MD

## 2010-03-25 NOTE — Progress Notes (Signed)
I saw and evaluated the patient. I reviewed the resident's note. I agree with the findings and plan of care as documented in the resident's note. Any exceptions/additions are noted.      Nettie Elm, MD 03/25/2010, 11:30 AM

## 2010-03-27 ENCOUNTER — Encounter (INDEPENDENT_AMBULATORY_CARE_PROVIDER_SITE_OTHER): Payer: MEDICAID | Admitting: ORTHOPEDIC, SPORTS MEDICINE

## 2010-03-27 ENCOUNTER — Ambulatory Visit (INDEPENDENT_AMBULATORY_CARE_PROVIDER_SITE_OTHER): Payer: MEDICAID | Admitting: Family Practice-BA

## 2010-03-27 MED ORDER — AMOXICILLIN 875 MG-POTASSIUM CLAVULANATE 125 MG TABLET
1.00 | ORAL_TABLET | Freq: Two times a day (BID) | ORAL | Status: DC
Start: 2010-03-27 — End: 2010-05-25

## 2010-03-27 NOTE — Progress Notes (Signed)
Subjective:     Patient ID:  [name] is an 54 y.o. female   Chief Complaint:  Chief Complaint   Patient presents with    Eye Swelling     and redness, bilateral         HPI Comments: Patient here for acute visit due to right eye erythema and swelling. She was hospitalized 3/29-4/2 for preorbital cellulitis. Was treated with vanc, Unasyn and antibiotic eye drops. Was discharged on Augmentin, Clindamycin, and Moxifloxacin eye drops. She felt like her symptoms were treated completely. Today she presents with a one week h/o AM frontal headaches, dull, wake her in the mornings. This AM she woke and right eye was blood shot and swollen. Has some blurry vision. Similar, but less severe to previous presentation. No fevers or chills. +sweats (attributes to going through menopause). Has h/o allergies. Eyes are not itchy or watery.       Review of Systems   Constitutional: Positive for diaphoresis. Negative for fever and chills.   HENT: Negative for congestion.    Eyes: Positive for blurred vision, pain and redness. Negative for double vision, photophobia and discharge.   Cardiovascular: Negative for chest pain.   Neurological: Positive for headaches.   Endo/Heme/Allergies: Positive for environmental allergies.     Objective:BP 122/88   Pulse 90   Temp 36.9 C (98.5 F)   Wt 93.441 kg (206 lb)   Physical Exam   Constitutional: She appears well-developed and well-nourished. No distress.   Eyes: Extraocular motions are normal. Pupils are equal, round, and reactive to light. Right eye exhibits no discharge and no exudate. Left eye exhibits no discharge and no exudate. Right conjunctiva is injected. Left conjunctiva is not injected.     .    Assessment & Plan:     1.  Conjunctivitis with h/o preseptal cellulitis:  Patient still has moxifloxacin eye drops which she agrees to restart. Rx for Augmentin 875mg  BID for 7 days ERx'd to be filled if symptoms worsen as discussed.    RTC with PCP 1-2 weeks or sooner if  needed    Hubert Azure, MD  Department of [facility] Medicine  PGY III  Pager [phone]

## 2010-03-27 NOTE — Progress Notes (Signed)
54 yo female here for follow-up on preorbital cellulitis - seemed to be resolved but has had headaches and today with redness and swelling of same eye.   Concerned with recurrence. Eye swollen shut this AM - better with ice. Has had allergic conjunctivitis in the past.     Prior to discharge of the patient, the record was reviewed, the patient seen and findings confirmed. Medical management was discussed with Dr. Durel Salts.    After conclusion of the encounter, my electronic signature will indicate review of the note and any needed additions or corrections.    Barbara Reasons Neiman-Hart,MD

## 2010-04-01 ENCOUNTER — Ambulatory Visit (INDEPENDENT_AMBULATORY_CARE_PROVIDER_SITE_OTHER): Payer: MEDICAID | Admitting: Family Practice-BA

## 2010-04-02 NOTE — Progress Notes (Signed)
Subjective:     Patient ID:  [name] is an 54 y.o. female   Chief Complaint:  Chief Complaint   Patient presents with    Eye Pain     follow-up, swelling still there redness gone         HPI Comments: Patient presents for follow-up conjunctivitis and pre-septal cellulitis per previous note by myself. She completed moxifloxican eye drops and has about 2 more days of Augmentin. She is feeling much better. Her symptoms have mostly resolved. No fevers, chills, eye pain, eye discharge.      Review of Systems   Constitutional: Negative for fever and chills.   Eyes: Positive for pain.   Respiratory: Negative for shortness of breath.    Cardiovascular: Negative for chest pain.     Objective:BP 126/80   Pulse 84   Temp 36 C (96.8 F)   Wt 92.08 kg (203 lb)   Physical Exam   Constitutional: She appears well-developed and well-nourished. No distress.   HENT:        Right eye with resolved conjunctivitis; very minimal erythema on lower eye-lid.   Skin: She is not diaphoretic.     .    Assessment & Plan:     373.13G Preseptal cellulitis:  Resolving on antibiotic. Complete antibiotic. Discussed warning symptoms of reoccurrence.     Return as scheduled with PCP or sooner if needed    Hubert Azure, MD  PGY III  [facility]

## 2010-04-02 NOTE — Progress Notes (Signed)
I discussed the patient's care with the Resident prior to the patient leaving the clinic. Any significant discussion points are noted.    Lucretia Roers, MD 04/02/2010, 1:31 PM

## 2010-04-03 ENCOUNTER — Encounter (INDEPENDENT_AMBULATORY_CARE_PROVIDER_SITE_OTHER): Payer: Self-pay | Admitting: ORTHOPEDIC, SPORTS MEDICINE

## 2010-04-03 ENCOUNTER — Ambulatory Visit (INDEPENDENT_AMBULATORY_CARE_PROVIDER_SITE_OTHER): Payer: MEDICAID | Admitting: ORTHOPEDIC, SPORTS MEDICINE

## 2010-04-03 NOTE — Progress Notes (Signed)
Name: [name]  MEDICAL RECORD [id number]  DATE OF BIRTH: 1956-08-21  DATE OF SERVICE: 04/03/2010    SUBJECTIVE:  [name] presents to clinic today for follow up of her   bilateral knee.  She has not gone to physical therapy because of recurrent eye infection.  Wants to go to therapy in the future.  She is doing HEP. No pain in joints, but pain over pes anserine bursa bilaterally.  The patient denies effusions, locking or instability symptoms of the knee.    PAST MEDICAL HISTORY  Reviewed and updated    MEDICATIONS  Current outpatient prescriptions   Medication Sig   . amoxicillin-pot clavulanate (AUGMENTIN) 875-125 mg Tab take 1 Tab by mouth every 12 hours.   Marland Kitchen duloxetine (CYMBALTA) 60 mg CpDR take 1 Cap by mouth Once a day.   . esomeprazole magnesium (NEXIUM) 40 mg CpDR take 1 Cap by mouth Twice a day before meals.   . Diclofenac Sodium (VOLTAREN) 75 mg TbEC take 1 Tab by mouth Twice daily.    Marland Kitchen docusate sodium (COLACE) 100 mg Cap take 1 Cap by mouth Twice daily.    . ASTELIN 137 mcg SprA 2 Sprays by Nasal route Twice daily. Use in each nostril as directed       ALLERGIES  Allergies   Allergen Reactions   . Codeine Itching     RASH   . Mobic (Meloxicam) Nausea/ Vomiting         REVIEW OF SYSTEMS  As above, otherwise all other systems are essentially negative.    PHYSICAL EXAM  [name] is a pleasant individual in no acute distress with appropriate mood and affect.  She is alert and oriented times three.  Respirations are unlabored.  Pulses palpable and equal distally.    Skin is warm and dry to touch.      On physical examination of the patient's bilateral knee, .    Range of Motion: full  Swelling: none  Tenderness to Palpation: pes anserine bursae and tendons  Lachman's: negative  Posterior drawer: negative  [place] Manuever: negative  .     ASSESSMENT: 54 y.o. year old female with bilateral knee pes anserine bursitis     PLAN:  At today's visit, we discussed treatment options concerning patient's knee. Bursae injections today.  New PT order given She  will f/u in clinic in pne.  If the patient has any questions or concerns, she can contact our office.      [name], MD 04/03/2010, 10:50 AM

## 2010-04-03 NOTE — Procedures (Signed)
The risks and benefits of the injection were explained to the patient and the decision was made to proceed with the injection.  The skin was prepped with betadine and the injection was carried out using 3 cc of 1% lidocaine and Celestone  6mg .  This was injected into the bilateral knee pes anserine bursa.  The skin was cleaned with alcohol and a sterile bandage was placed over the injection site. The patient tolerated the injection well.

## 2010-04-23 ENCOUNTER — Encounter (INDEPENDENT_AMBULATORY_CARE_PROVIDER_SITE_OTHER): Payer: Self-pay | Admitting: Orthopaedic Surgery of the Spine

## 2010-04-23 ENCOUNTER — Ambulatory Visit (INDEPENDENT_AMBULATORY_CARE_PROVIDER_SITE_OTHER): Payer: MEDICAID | Admitting: Orthopaedic Surgery of the Spine

## 2010-04-23 NOTE — Progress Notes (Signed)
See dictated note.    Cornelious Bryant, MD 04/23/2010, 3:58 PM  Resident PGY-2  [facility]

## 2010-04-24 NOTE — Progress Notes (Signed)
I saw and evaluated the patient. I reviewed the resident's note. I agree with the findings and plan of care as documented in the resident's note. Any exceptions/additions are noted.      John France, MD 04/24/2010, 3:17 PM

## 2010-04-24 NOTE — Progress Notes (Signed)
[facility] Department of Orthopaedics  [address]  [place], [place] [address]      PROGRESS NOTE    PATIENT NAME: [name], [name]  CHART NUMBER: [id number]  DATE OF BIRTH: [birth year]  DATE OF SERVICE: 04/23/2010    REASON FOR VISIT: Followup visit for the diagnosis cervical disk herniations with cervical stenosis.    SUBJECTIVE: [name] returns to clinic for MRI followup. The last time we saw her in clinic, she was complaining of a lot of neck pain and bilateral arm pain. At that time, she had some mild myelopathic signs, including a little bit of a Hoffman and hyperreflexia. She says her symptoms are largely unchanged. She is still having pain radiating down from the neck down bilateral arms. Mainly it is affecting her left upper extremity. She still denies any problems with buttoning buttons or issues with fine motor skills. She has not complained of any balance issues. She does complain that the left hand does go to sleep on her and tingle a lot at night. It wakes her up, and she has to shake her hand to relieve that feeling.    OBJECTIVE: Spinal exam: She has mild tenderness to palpation of the cervical spine. She has 5/5 strength bilateral upper extremities including hand intrinsics, wrist extensors, wrist flexors, biceps and triceps. She has 2+/4 DTRs in the right upper extremity. She has 3/4 brisk DTRs of the left upper extremity. She has a positive Phalen's test on bilateral wrists and a positive Tinel's on the right. She has a mild Hoffmann, bilateral upper extremities but with left greater than right. Bilateral lower extremities: She has 5/5 strength throughout including EHLs, tibialis anterior, gastrocs, quads and hamstrings. She has 2+/4 DTRs bilateral lower extremities.     RADIOGRAPHS: MRI of the cervical spine was reviewed. It shows she has a mild disk bulge at C4-C5 with mild cervical stenosis. No cord deformation. At C5-,C6 she has a disk herniation there that seems to migrate superiorly causing moderate canal stenosis and some mild cord deformation. At C6-C7, she has a disk bulge with mild stenosis in that area.    ASSESSMENT: Cervical disk herniations with cervical stenosis.    PLAN: Concerning her cervical stenosis, it seems that her canal still is fairly open. She does have CSF signals on both sides of the cord. There is really no cord signal change at this time. She does not seem myelopathic. She has some subtle myelopathic symptoms at this time and a little bit of hyperreflexia. We went ahead and wrote for her to begin some physical therapy with isometric neck exercises and some flexibility. They can also do some gentle traction as well to see if that might help out her neck some. She does have some physical exam findings of carpal tunnel syndrome so we would like to go ahead and get an EMG of her bilateral upper extremities to delineate this. She does have carpal tunnel syndrome. She may need a carpal tunnel release. We wrote for her some of the cockup wrist splint for the left upper extremity. She seems to have one for the right upper extremity already. We told her that she is to wear these when she is doing activities where it causes wrist flexion; things like driving, typing and also sleeping at night. We would like to see her back after the EMG and that will give her a few weeks to do physical therapy as well. We will see how she is doing  overall. We talked with her a little bit about surgical treatment options, but it was a very brief. When she returns to clinic, if she is still not doing better, we may end up having to discuss this with her a little bit more. So she is going to followup after the EMG study.      Cornelious Bryant, MD  Resident   [facility] Department of Orthopaedics    John Guinea-Bissau, MD  Professor  [facility] Department of Orthopaedics    [id number]; D: 04/23/2010 19:14:05; T: 04/24/2010 00:28:31

## 2010-05-05 ENCOUNTER — Ambulatory Visit
Admission: RE | Admit: 2010-05-05 | Discharge: 2010-05-05 | Disposition: A | Discharge status: Home / Self Care | Payer: MEDICAID | Attending: Orthopaedic Surgery of the Spine | Admitting: Orthopaedic Surgery of the Spine

## 2010-05-05 DIAGNOSIS — M4802 Spinal stenosis, cervical region: Secondary | ICD-10-CM | POA: Insufficient documentation

## 2010-05-14 ENCOUNTER — Ambulatory Visit (INDEPENDENT_AMBULATORY_CARE_PROVIDER_SITE_OTHER): Payer: MEDICAID | Admitting: Orthopaedic Surgery of the Spine

## 2010-05-14 NOTE — Progress Notes (Signed)
See dictated note.

## 2010-05-15 NOTE — Progress Notes (Signed)
I saw and evaluated the patient. I reviewed the resident's note. I agree with the findings and plan of care as documented in the resident's note. Any exceptions/additions are noted.      John France, MD 05/15/2010, 7:09 PM

## 2010-05-15 NOTE — Progress Notes (Signed)
[facility] Department of Orthopaedics  [address]  [place], [place] [address]      PROGRESS NOTE    PATIENT NAME: [name], [name]  CHART NUMBER: [id number]  DATE OF BIRTH: 04-Sep-1956  DATE OF SERVICE: 05/14/2010    SUBJECTIVE: Ms. Schobert comes in to see [place] today after her previous visit on 04/23/2010 with continued complaint of neck pain and right upper extremity pain. At our last visit, she had bilateral upper extremity pain and some findings that were kind of concerning for some myelopathy. At that time, she had a positive Hoffmann and has some hyperreflexia of the bilateral upper extremities. She was, however, complaining mostly of left upper extremity pain, which radiated down to her thumb and index and middle finger. An MRI was obtained prior to that visit, which revealed no real etiology for her symptoms. That prompted [place] to feel that these could be caused from carpal tunnel syndrome. We went ahead and ordered her a cockup wrist splint for the left wrist and she stated she had a cockup wrist splint for the right at home. Upon today's visit, Ms. Melchor states that her left upper extremity symptoms are completely resolved after wearing the splint. She does still have some right upper extremity numbness and tingling into the thumb and index finger; however, she has not been wearing her cockup wrist splint on the right. When questioned she states about 50% of her total pain comes from her neck and the other 50% from her right upper extremity.    OBJECTIVE: On physical exam, Ms. Dimitroff does still exhibit a mild Hoffmann sign bilaterally. Biceps tendon reflexes are still a little hyperreflexic (three on four). Otherwise, there is no weakness in her biceps, triceps, wrist extensors, wrist flexors, DIPs, or hand intrinsics.    ASSESSMENT: Neck pain with possible bilateral carpal tunnel syndrome.     PLAN: Since Ms. Tercero obtained such drastic pain relief in her left upper extremity with a cockup wrist splint, we feel that her right would also benefit from this treatment. We went ahead and ordered her splint with DME today and would like her to followup in three months just to make sure she has no progressive myelopathic symptoms. However, we are not too concerned with this; however, we could not ignore her Hoffman positive Hoffmann sign and hyperreflexia. She was instructed to wear the splint at all times and encouraged to call or return to clinic sooner if she had any questions, problems or concerns. She was given signs and symptoms to watch out for, including weakness and her clumsiness of the hands and trouble with gait. She was told that these were serious symptoms and if they should evolve that she need to report to the emergency department as soon as possible.      Daisy Lazar, MD  Resident  [place] Department of Orthopaedics    John Guinea-Bissau, MD  Professor  [facility] Department of Orthopaedics    [id number]; D: 05/14/2010 17:50:57; T: 05/15/2010 01:13:21

## 2010-05-18 ENCOUNTER — Encounter (INDEPENDENT_AMBULATORY_CARE_PROVIDER_SITE_OTHER): Payer: MEDICAID | Admitting: Family Medicine

## 2010-05-25 ENCOUNTER — Ambulatory Visit (INDEPENDENT_AMBULATORY_CARE_PROVIDER_SITE_OTHER): Payer: MEDICAID | Admitting: Gastroenterology-BA

## 2010-05-25 ENCOUNTER — Encounter (INDEPENDENT_AMBULATORY_CARE_PROVIDER_SITE_OTHER): Payer: Self-pay | Admitting: Gastroenterology-BA

## 2010-05-25 VITALS — BP 128/80 | HR 118 | Temp 98.3°F | Ht 62.0 in | Wt 207.2 lb

## 2010-05-25 MED ORDER — ESOMEPRAZOLE MAGNESIUM 40 MG CAPSULE,DELAYED RELEASE
40.0000 mg | DELAYED_RELEASE_CAPSULE | Freq: Two times a day (BID) | ORAL | Status: DC
Start: 2010-05-25 — End: 2011-01-01

## 2010-05-25 NOTE — Progress Notes (Addendum)
Requesting Physician: No ref. provider found  History of Present Illness  [name] is a 54 y.o. female who presents with a chief complaint of Follow-up  for her GERD symptoms and to discuss biopsy results from her recent EGD.  She has a Hx of  GERD and dysphagia. On her last visit her GERD symptoms were lot worse and during that visit PPI were added  and was advised to take it twice daily. In addition she underwent EGD on 5/28/ 2011 and was noted to have antral erythema and biopsies were taken. Path showed chronic inactive gastritis and Squamocolumnar junctional-type mucosa with mild reflux changes.  In the past she was diagnosed with dysphagia and GERD and had  The following work up:  24-hour pH probe was 01/03/08- was abnormal off PPI   Manometry was 01/03/08- Nonspecific abnormalities and  LES with in the normal range with relaxation, but peristalsis in the body has a dropped beat and some hypertensive contractions.   Colonoscopy (01/06/10) which demonstrated a few diverticula of the sigmoid colon.   She presented today for a follow up. Denies any GERD symptoms. Denies any N/V/Heatburn/weight loss/dysphagia/odynophagia/blood in stools or hematemesis.   She states that she is  following the GERD recommendations including quitting smoking and reducing coffee, fatty food, and NSAID intake.  She sleeps with head elevated to prevent reflex during sleep.  Her dysphagia is being followed by ENT, and denies any current symptoms.  Past History  Current outpatient prescriptions   Medication Sig   . esomeprazole magnesium (NEXIUM) 40 mg CpDR take 1 Cap by mouth Twice a day before meals.   Marland Kitchen duloxetine (CYMBALTA) 60 mg CpDR take 1 Cap by mouth Once a day.   . Diclofenac Sodium (VOLTAREN) 75 mg TbEC take 1 Tab by mouth Twice daily.    . ASTELIN 137 mcg SprA 2 Sprays by Nasal route Twice daily. Use in each nostril as directed    . DISCONTD: amoxicillin-pot clavulanate (AUGMENTIN) 875-125 mg Tab take 1 Tab by mouth every 12 hours.   Marland Kitchen DISCONTD: esomeprazole magnesium (NEXIUM) 40 mg CpDR take 1 Cap by mouth Twice a day before meals.   Marland Kitchen DISCONTD: docusate sodium (COLACE) 100 mg Cap take 1 Cap by mouth Twice daily.        Allergies   Allergen Reactions   . Codeine Itching     RASH   . Mobic (Meloxicam) Nausea/ Vomiting       Past Medical History   Diagnosis Date   . depression    . Multinodular goiter    . Headache      Migraines   . Arthritis    . GERD (gastroesophageal reflux disease)    . Vaginal prolapse    . Rheumatic fever        Past Surgical History   Procedure Date   . Pb upper gi endoscopy,biopsy 12/19/07     patulous GE junction zone, erythema, nonerosive GERD   . Hx ankle fracture tx 2007     left distal fibula, casted   . Hx wrist fracture tx 2007     FOOSH injury   . Hx partial hysterectomy    . Colonoscopy 01/06/09     COLONOSCOPY performed by Augustin Schooling F at [facility] OR ENDO   . Hx cystocele repair 09/24/09   . Hx cholecystectomy    . Hx adenoidectomy    . Hx tonsillectomy    . Gastroscopy 03/05/2010     GASTROSCOPY performed  by Knox Saliva at [facility] OR ENDO   . Gastroscopy with biopsy 03/05/2010     GASTROSCOPY WITH BIOPSY performed by Knox Saliva at [facility] OR ENDO       Family History  Family History   Problem Relation Age of Onset   . Congestive Heart Failure Father    . Heart Attack Sister      69   . Hypertension Brother    . Hypertension Sister    . Hypertension Sister    . Kidney Disease Sister      ESRD   . Stroke Paternal Grandmother    . Diabetes Mother    . Thyroid Disease Sister        Social History  History   Social History   . Marital Status: Divorced     Spouse Name: N/A     Number of Children: N/A   . Years of Education: N/A   Occupational History   . 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 Smoking status: Former Smoker -- 2.0 packs/day for 40 years     Types: Cigarettes    . Smokeless tobacco: Never Used    Comment: quit 2007   . Alcohol Use: No   . Drug Use: No   . Sexually Active: Not on file   Other Topics Concern   . Not on file   Social History Narrative   . No narrative on file       Review of Systems  Constitutional: negative for fever, chills and weight loss  Eyes: negative for visual disturbance and icterus  Respiratory: negative for cough, hemoptysis or dyspnea on exertion  Cardiovascular: negative for chest pain, chest pressure/discomfort  Gastrointestinal: see HPI  Genitourinary:negative for frequency, dysuria and hematuria  Musculoskeletal: negative for arthralgias  Neurological: negative for weakness or loss of sensation  Psych: no depression nor anxiety  Examination  Vitals: BP 128/80   Pulse 118   Temp 36.8 C (98.3 F)   Ht 1.575 m (5\' 2" )   Wt 93.985 kg (207 lb 3.2 oz)   BMI 37.90 kg/m2   SpO2 98%  Body mass index is 37.90 kg/(m^2).  General: appears in good health, moderately obese and no distress  Eyes: Conjunctiva clear., Pupils equal and round, reactive to light and accomodation.   HENT:ENT without erythema or injection, mucous membranes moist.  Neck: supple, symmetrical, trachea midline  Lungs: clear to auscultation bilaterally.   Cardiovascular:    Heart regular rate and rhythm  Abdomen: soft, non-tender, bowel sounds normal and non-distended  Extremities: no cyanosis or edema  Skin: Skin warm and dry  Neurologic: grossly normal  Lymphatics: no lymphadenopathy  Psychiatric: AOx3 and normal  Impression:  1. GERD: well controlled on PPI, recent EGD showed chronic inactive gastritis and squamocolumnar junctional-type mucosa with mild reflux changes. Discussed anti-reflux measures and precautions with patient.   2. Hx of Dysphagia: follows her ENT surgeon, symptoms much improved on PPI.    Recommendations:  1.   GERD: continue Nexium BID, will give a script for Nexium   2.   Follow up in 1 year and early if her symptoms are worse.          Esau Grew, MD   Fellow, Section of Digestive Diseases  [place] Department of Medicine    The patient was seen and evaluated by Dr. Boyd Kerbs who is in agreement with the Impression and Recommendations.    I saw and evaluated the  patient. I reviewed the resident's /fellow's note and agree with the documented findings and plan of care.   Knox Saliva, MD   Assistant Professor of Medicine   Section of Digestive Diseases   [facility]   05/27/2010 11:48 PM

## 2010-05-27 NOTE — Progress Notes (Signed)
I saw and evaluated the patient. I reviewed the resident's note. I agree with the findings and plan of care as documented in the resident's note. Any exceptions/additions are noted.      Knox Saliva, MD 05/27/2010, 11:48 PM

## 2010-05-28 ENCOUNTER — Ambulatory Visit (INDEPENDENT_AMBULATORY_CARE_PROVIDER_SITE_OTHER): Payer: MEDICAID | Admitting: Psychiatry

## 2010-05-28 MED ORDER — ZOLPIDEM 10 MG TABLET
10.00 mg | ORAL_TABLET | Freq: Every evening | ORAL | Status: DC | PRN
Start: 2010-05-28 — End: 2010-05-28

## 2010-05-28 MED ORDER — LEVOMEFOLATE CALCIUM 7.5 MG TABLET
1.0000 | ORAL_TABLET | Freq: Every day | ORAL | Status: DC
Start: 2010-05-28 — End: 2010-07-02

## 2010-05-28 MED ORDER — ZOLPIDEM 10 MG TABLET
10.00 mg | ORAL_TABLET | Freq: Every evening | ORAL | Status: DC | PRN
Start: 2010-05-28 — End: 2010-11-10

## 2010-05-28 NOTE — Progress Notes (Signed)
[name]  [id number]  [birth year]  05/28/2010    Chief Complaint: Follow up    Subjective:  Patient of Dr. Leretha Pol now transitioning to my clinic for management of major depressive disorder. She states that she has been taking Cymbalta for a long time and it had been helpful, but she doesn't think it has been working well lately. Her primary complaint is that she is unable to sleep. She takes her Cymbalta in the morning. Otherwise she denies side effects. She states that she feels herself getting more depressed now. She had a similar problem with Prozac in the past.    She denies SI or current stressors.    Objective:    Mental Status Examination  Appearance: Casually dressed, well groomed, good hygiene  Behavior: Pleasant and cooperative  Level of consciousness: Alert  Orientation: Grossly normal  Motor: No movement abnormalities  Speech: Regular rate, rhythm, and volume  Attention: Adequate for conversation, no excessive distractibility  Eye contact: Good  Memory: No impairment of immediate, delayed, or distant recall  Mood: depressed  Affect: constricted and congruent with mood  Thought process: Linear and goal-directed  Thought content: Appropriate, no paranoia, no delusions  Perception: No audiovisual hallucinations  Suicide ideation: None  Homicide ideation: None        Medications:  Current outpatient prescriptions   Medication Sig   . esomeprazole magnesium (NEXIUM) 40 mg CpDR take 1 Cap by mouth Twice a day before meals.   Marland Kitchen duloxetine (CYMBALTA) 60 mg CpDR take 1 Cap by mouth Once a day.   . Diclofenac Sodium (VOLTAREN) 75 mg TbEC take 1 Tab by mouth Twice daily.    . ASTELIN 137 mcg SprA 2 Sprays by Nasal route Twice daily. Use in each nostril as directed           Assessment:  Axis I: Major Depressive Disorder  Axis II: None  Axis III: rectocele repair, bilateral knee arthritis, neck mass, GERD, pending r/o H. Pylori, history of multinodular goiter, migraines  Axis IV: medical illness   Axis V: GAF: 60    Plan:    Continue Cymbalta 60 mg daily. Start Deplin 7.5 mg daily - may call to increase dose if incompletely helpful. RTC 07/02/10 at 12:30. Patient was also given Ambien 10 mg disp # 5 with instructions to use them as needed for sleep and to only take them after lying down to sleep and when she can devote 8 full hours to sleep.  Patient advised to call with any questions or concerns. She was given a business card with my contact information.    Farris Has. Pascha Fogal, MD 05/28/2010, 3:00 PM

## 2010-06-04 ENCOUNTER — Encounter (INDEPENDENT_AMBULATORY_CARE_PROVIDER_SITE_OTHER): Payer: Self-pay | Admitting: Family Medicine

## 2010-06-04 ENCOUNTER — Ambulatory Visit (INDEPENDENT_AMBULATORY_CARE_PROVIDER_SITE_OTHER): Payer: MEDICAID | Admitting: Family Medicine

## 2010-06-04 VITALS — BP 126/84 | HR 88 | Temp 98.5°F | Ht 65.0 in | Wt 204.0 lb

## 2010-06-04 DIAGNOSIS — Z1239 Encounter for other screening for malignant neoplasm of breast: Secondary | ICD-10-CM

## 2010-06-04 DIAGNOSIS — Z01419 Encounter for gynecological examination (general) (routine) without abnormal findings: Secondary | ICD-10-CM

## 2010-06-04 DIAGNOSIS — R7301 Impaired fasting glucose: Secondary | ICD-10-CM

## 2010-06-04 DIAGNOSIS — L940 Localized scleroderma [morphea]: Secondary | ICD-10-CM

## 2010-06-04 DIAGNOSIS — Z5181 Encounter for therapeutic drug level monitoring: Secondary | ICD-10-CM

## 2010-06-04 DIAGNOSIS — E785 Hyperlipidemia, unspecified: Secondary | ICD-10-CM

## 2010-06-04 DIAGNOSIS — N904 Leukoplakia of vulva: Secondary | ICD-10-CM | POA: Insufficient documentation

## 2010-06-04 DIAGNOSIS — Z0000 Encounter for general adult medical examination without abnormal findings: Secondary | ICD-10-CM | POA: Insufficient documentation

## 2010-06-04 DIAGNOSIS — R631 Polydipsia: Secondary | ICD-10-CM

## 2010-06-04 DIAGNOSIS — Z23 Encounter for immunization: Secondary | ICD-10-CM

## 2010-06-04 MED ORDER — HALOBETASOL PROPIONATE 0.05 % TOPICAL CREAM
TOPICAL_CREAM | Freq: Every day | CUTANEOUS | Status: DC
Start: 2010-06-04 — End: 2010-09-09

## 2010-06-04 NOTE — H&P (Signed)
SUBJECTIVE:   [name] is a 54 y.o. female  for presenting for her annual checkup.    Prevention History:   Pap hysterectomy   Mammogram 04/23/09 DUE  colonoscopy 2011, repeat in 10 yrs  DTap DUE  Flu gets sometimes  Pneumovax not indicated yet    Lab Results   Component Value Date    CHOLESTEROL 206* 10/14/2008    HDLCHOL 31* 10/14/2008    LDLCHOL 138* 10/14/2008    TRIG 187* 10/14/2008    GLUCOSEFAST 113* 10/14/2008     Diet History: "healthy" diet  in general  high fiber  Calcium intake:adequate  Exercise History: some walking most days per week    Other Health Issues (not prevention):   Has CTS and knee patellofemoral syndrome being treated by [facility] and 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  Current outpatient prescriptions   Medication Sig   . L-Methylfolate (DEPLIN) 7.5 mg Tab take 1 Tab by mouth Once a day.   . Zolpidem (AMBIEN) 10 mg Tab take 1 Tab by mouth Every night as needed for Insomnia.   Marland Kitchen esomeprazole magnesium (NEXIUM) 40 mg CpDR take 1 Cap by mouth Twice a day before meals.   Marland Kitchen duloxetine (CYMBALTA) 60 mg CpDR take 1 Cap by mouth Once a day.   . Diclofenac Sodium (VOLTAREN) 75 mg TbEC take 1 Tab by mouth Twice daily.    . ASTELIN 137 mcg SprA 2 Sprays by Nasal route Twice daily. Use in each nostril as directed        Tobacco History  reports that she has quit smoking.  Her smoking use included cigarettes.  She has a 80 pack-year smoking history. She has never used smokeless tobacco.  Immunization History   Administered Date(s) Administered   . H1n1 Flu Vaccine Injection (Admin) 10/10/2008   . INFLUENZA VIRUS VACCINE(ADMIN) 09/11/2001       family history includes Bipolar Disorder in her daughter; Congestive Heart Failure in her father; Coronary Artery Disease (age of  onset:65) in her mother; Diabetes in her mother; Heart Attack in her sister; High Cholesterol in her mother; Hypertension in her brother, mother, and sisters; Kidney Disease in her sister; Stroke in her paternal grandmother; and Thyroid Disease in her sister.    ROS:   Constitutional: negative for fevers, chills and weight loss  Eyes: negative for visual disturbance  Ears, nose, mouth, throat, and face: negative for hearing loss, earaches and hoarseness  +bilateral ear aches shortlived at times  Respiratory: negative for cough or dyspnea on exertion  Cardiovascular: negative for chest pressure/discomfort, palpitations, near-syncope, syncope  Gastrointestinal: negative for dysphagia, nausea, vomiting, diarrhea, abdominal pain and blood in bowel movements +some constipation, takes colace 100 mg daily with so so results  Genitourinary:negative for frequency, dysuria, incontinence or hematuria.    Menstrual history: post-hysterectomy, no sexual concerns or vaginal irritation. 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, wearing braces, being treated by [facility]  Neurological: negative for seizures and stroke  or TIA symptoms  Behvioral/Psych: positive for depression and anxiety under care of psychiatry doctor  Endocrine: negative for diabetic  symptoms including  polydipsia and weight loss +polydipsia  Allergic/Immunologic: negative    OBJECTIVE:   Wt Readings from Last 3 Encounters:   06/04/10 92.534 kg (204 lb)   05/25/10 93.985 kg (207 lb 3.2 oz)   04/01/10 92.08 kg (203 lb)       BP Readings from Last 3 Encounters:   06/04/10 126/84   05/25/10 128/80   04/01/10 126/80       BP 126/84  Pulse 88  Temp 36.9 C (98.5 F)  Ht 1.651 m (5\' 5" )  Wt 92.534 kg (204 lb)  BMI 33.95 kg/m2   Body mass index is  33.95 kg/(m^2).   General appearance: alert, well appearing in no distress.  PERLA.  TMs, mouth and pharynx are normal.  Neck supple without adenopathy or thyromegaly.  Lungs are clear, good air entry, no wheezes, rhonchi or rales. Heart: S1 and S2 normal, no murmurs or gallops, regular rate and rhythm.  No edema.  Abdomen soft without tenderness, guarding, mass or organomegaly.  Skin without worrisome lesions.  Reflexes are normal.  Breasts: normal without suspicious masses, skin or nipple changes or axillary nodes.  Pelvic exam: vulva with some white discoloration and a midline fissure on the pudendum that looks like localized lichen sclerosis, no agglutination or nodularity, rest of the vulva with atrophic changes only, vaginal normal, urethra normal, bimanual no pelvic masses appreciated, adnexa not palpable, perianal skin appears normal.      ASSESSMENT & PLAN:   1. Well woman exam with routine gynecological exam (V72.43M)     2. Breast cancer screening (V76.10C)  MAMMO BILATERAL SCREENING     3. Hyperlipemia (272.4R)  LIPID PANEL     4. Elevated fasting glucose (790.21B)  HGA1C (HEMOGLOBIN A1C WITH EST AVG   GLUCOSE)     5. Polydipsia (783.5)  ELECTROLYTES, BUN, CREATININE     6. Therapeutic drug monitoring diclofenac (V58.83E)  ELECTROLYTES, BUN, CREATININE, CBC/DIFF, AST (SGOT), ALT (SGPT)     7. 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  The patient indicates understanding of these issues and agrees to the plan.      8. Need for DTaP vaccine (V06.1C)  TETANUS,DIPTHERIA,ACEL PERT (ADACEL)(ADMIN) today         Return office visit in 3 months.   Encouraged to get regular exercise (60 mins more days than not) and to use Vitamin D 1000 units daily during cool weather months.  Theola Sequin, MD  [facility] FAMILY MEDICINE [facility]  FAMILY MEDICINE-[facility]  [address]  [place] [address]  [phone]

## 2010-06-04 NOTE — Progress Notes (Signed)
Immunization History   Administered Date(s) Administered   . DIPTH,PERTUSSIS-ACEL,TETANUS (ADACEL)(ADMIN) 06/04/2010   . H1n1 Flu Vaccine Injection (Admin) 10/10/2008   . INFLUENZA VIRUS VACCINE(ADMIN) 09/11/2001     Marni Griffon, MA

## 2010-06-04 NOTE — Patient Instructions (Signed)
LAB TESTS  Fasting lab test(s) have been ordered for you.  Please return to the lab SOON when you have fasted overnight to have lab test drawn.  Fasting means that you should have no food or drinks that contain calories for at least 10 hrs before this test.  You may have water, coffee or tea without cream or sugar (artificial sweetener is okay), or diet soft drinks during the 10 hr fast.    You don't need an appointment for lab tests. The Family Medicine lab is open 8 am to 3:30 pm every weekday.  The [facility] lab is open Monday thru Friday 7:30 am to 6 pm, and Saturday 9:30 am to 1:30 pm.  You may go to whichever lab is most convenient for you.      Orders Placed for labs include:   . HGA1C (HEMOGLOBIN A1C WITH EST AVG GLUCOSE)   . LIPID PANEL   . ELECTROLYTES   . BUN   . CREATININE   . CBC/DIFF   . AST (SGOT)   . ALT (SGPT)

## 2010-06-17 ENCOUNTER — Telehealth (HOSPITAL_COMMUNITY): Payer: Self-pay | Admitting: Psychiatry

## 2010-06-17 MED ORDER — DULOXETINE 60 MG CAPSULE,DELAYED RELEASE
60.0000 mg | DELAYED_RELEASE_CAPSULE | Freq: Every day | ORAL | Status: DC
Start: 2010-06-17 — End: 2010-10-01

## 2010-06-17 NOTE — Telephone Encounter (Signed)
Ordered Cymbalta refill.    Farris Has. Tahjai Schetter, MD 06/17/2010, 7:55 PM

## 2010-07-01 ENCOUNTER — Ambulatory Visit
Admission: RE | Admit: 2010-07-01 | Discharge: 2010-07-01 | Disposition: A | Discharge status: Home / Self Care | Payer: MEDICAID | Attending: Family Medicine | Admitting: Family Medicine

## 2010-07-01 DIAGNOSIS — R631 Polydipsia: Secondary | ICD-10-CM | POA: Insufficient documentation

## 2010-07-01 DIAGNOSIS — E785 Hyperlipidemia, unspecified: Secondary | ICD-10-CM | POA: Insufficient documentation

## 2010-07-01 DIAGNOSIS — Z791 Long term (current) use of non-steroidal anti-inflammatories (NSAID): Secondary | ICD-10-CM | POA: Insufficient documentation

## 2010-07-01 DIAGNOSIS — Z5181 Encounter for therapeutic drug level monitoring: Secondary | ICD-10-CM | POA: Insufficient documentation

## 2010-07-01 DIAGNOSIS — R7301 Impaired fasting glucose: Secondary | ICD-10-CM | POA: Insufficient documentation

## 2010-07-01 LAB — HGA1C (HEMOGLOBIN A1C WITH EST AVG GLUCOSE)
ESTIMATED AVERAGE GLUCOSE: 123 mg/dL
HEMOGLOBIN A1C: 5.9 % (ref 4.0–6.0)

## 2010-07-01 LAB — ELECTROLYTES
ANION GAP: 6 mmol/L (ref 5–16)
CARBON DIOXIDE: 28 mmol/L (ref 22–32)
CHLORIDE: 108 mmol/L (ref 96–111)
POTASSIUM: 4.4 mmol/L (ref 3.5–5.1)
SODIUM: 142 mmol/L (ref 136–145)

## 2010-07-01 LAB — CBC/DIFF
BASOPHILS: 1 % (ref 0–1)
BASOS ABS: 0.046 10*3/uL (ref 0.0–0.2)
EOS ABS: 0.105 10*3/uL (ref 0.1–0.3)
EOSINOPHIL: 2 % (ref 1–6)
HCT: 40.5 % (ref 33.5–45.2)
HGB: 13.7 g/dL (ref 11.5–15.2)
LYMPHOCYTES: 39 % (ref 20–45)
LYMPHS ABS: 2.23 10*3/uL (ref 1.0–4.8)
MCH: 28.5 pg (ref 27.4–33.0)
MCHC: 33.7 g/dL (ref 31.6–35.5)
MCV: 84.6 fL (ref 82.0–99.0)
MONOCYTES: 7 % (ref 4–13)
MONOS ABS: 0.401 10*3/uL (ref 0.1–0.9)
MPV: 6.5 FL — ABNORMAL LOW (ref 7.4–10.4)
NRBC'S: 0 /100WBC
PLATELET COUNT: 405 10*3/uL (ref 140–450)
PMN ABS: 2.9 10*3/uL (ref 1.5–7.7)
PMN'S: 51 % (ref 40–75)
RBC: 4.79 MIL/uL (ref 3.84–5.04)
RDW: 13.2 % (ref 10.2–14.0)
WBC: 5.7 10*3/uL (ref 3.5–11.0)

## 2010-07-01 LAB — BUN
BUN/CREAT RATIO: 9 (ref 6–22)
BUN: 9 mg/dL (ref 6–20)

## 2010-07-01 LAB — AST (SGOT): AST (SGOT): 25 U/L (ref 8–41)

## 2010-07-01 LAB — LIPID PANEL
CHOLESTEROL: 191 mg/dL (ref <200)
HDL-CHOLESTEROL: 29 mg/dL — ABNORMAL LOW (ref >39)
LDL (CALCULATED): 129 mg/dL — ABNORMAL HIGH (ref <100)
NON - HDL (CALCULATED): 162 mg/dL (ref <190)
TRIGLYCERIDES: 163 mg/dL — ABNORMAL HIGH (ref <150)
VLDL (CALCULATED): 33 mg/dL — ABNORMAL HIGH (ref <30)

## 2010-07-01 LAB — ALT (SGPT): ALT (SGPT): 22 U/L (ref 7–45)

## 2010-07-01 LAB — CREATININE
CREATININE: 0.97 mg/dL (ref 0.49–1.10)
ESTIMATED GLOMERULAR FILTRATION RATE: >59 mL/min/{1.73_m2} (ref >59)

## 2010-07-02 ENCOUNTER — Ambulatory Visit (INDEPENDENT_AMBULATORY_CARE_PROVIDER_SITE_OTHER): Payer: MEDICAID | Admitting: Psychiatry

## 2010-07-02 MED ORDER — LEVOMEFOLATE CALCIUM 15 MG TABLET
1.0000 | ORAL_TABLET | Freq: Every day | ORAL | Status: DC
Start: 2010-07-02 — End: 2010-10-01

## 2010-07-02 NOTE — Progress Notes (Signed)
[name]  [id number]  01/09/1956  07/02/2010    Chief Complaint: Follow up    Subjective:  Currently in situational stress. Patient reports that Deplin has helped increase her sleep to 5.5 hours per night up from 2 hours per night before. Current stressors include roach infestation from next door. She reports that her mood is "okay" in spite of the stress. She is tolerating her current medications well and had no other questions or concerns.    Objective:    Mental Status Examination  Appearance: Casually dressed, well groomed, good hygiene  Behavior: Pleasant and cooperative  Level of consciousness: Alert  Orientation: Grossly normal  Motor: No movement abnormalities  Speech: Regular rate, rhythm, and volume  Attention: Adequate for conversation, no excessive distractibility  Eye contact: Good  Memory: No impairment of immediate, delayed, or distant recall  Mood: "okay"  Affect: constricted and congruent with mood  Thought process: Linear and goal-directed  Thought content: Appropriate, no paranoia, no delusions  Perception: No audiovisual hallucinations  Suicide ideation: None  Homicide ideation: None        Medications:  Current outpatient prescriptions   Medication Sig   . duloxetine (CYMBALTA) 60 mg CpDR take 1 Cap by mouth Once a day.   Marland Kitchen Halobetasol Propionate (ULTRAVATE) 0.05 % Cream Apply  topically Once a day.   Marland Kitchen L-Methylfolate (DEPLIN) 7.5 mg Tab take 1 Tab by mouth Once a day.   . Zolpidem (AMBIEN) 10 mg Tab take 1 Tab by mouth Every night as needed for Insomnia.   Marland Kitchen esomeprazole magnesium (NEXIUM) 40 mg CpDR take 1 Cap by mouth Twice a day before meals.   . Diclofenac Sodium (VOLTAREN) 75 mg TbEC take 1 Tab by mouth Twice daily.    . ASTELIN 137 mcg SprA 2 Sprays by Nasal route Twice daily. Use in each nostril as directed           Assessment:  Axis I: Major Depressive Disorder  Axis II: None   Axis III: rectocele repair, bilateral knee arthritis, neck mass, GERD, pending r/o H. Pylori, history of multinodular goiter, migraines  Axis IV: medical illness  Axis V: GAF: 65    Plan:    Continue Cymbalta 60 mg daily. Increase Deplin to 15 mg daily.    Patient advised to call with any questions or concerns.    Farris Has. Hajer Dwyer, MD 07/02/2010, 12:49 PM

## 2010-07-08 ENCOUNTER — Ambulatory Visit (HOSPITAL_COMMUNITY): Payer: MEDICAID

## 2010-07-10 ENCOUNTER — Encounter (HOSPITAL_COMMUNITY): Payer: MEDICAID | Admitting: Psychiatry

## 2010-07-15 ENCOUNTER — Ambulatory Visit
Admission: RE | Admit: 2010-07-15 | Discharge: 2010-07-15 | Disposition: A | Discharge status: Home / Self Care | Payer: MEDICAID | Attending: Family Medicine | Admitting: Family Medicine

## 2010-07-15 DIAGNOSIS — Z1231 Encounter for screening mammogram for malignant neoplasm of breast: Secondary | ICD-10-CM | POA: Insufficient documentation

## 2010-07-15 DIAGNOSIS — Z803 Family history of malignant neoplasm of breast: Secondary | ICD-10-CM | POA: Insufficient documentation

## 2010-07-15 NOTE — Progress Notes (Signed)
I saw and evaluated the patient. I reviewed the resident's note. I agree with the findings and plan of care as documented in the resident's note. Any exceptions/additions are noted.      Jenavi Beedle, MD 07/15/2010, 10:28 AM

## 2010-07-16 ENCOUNTER — Ambulatory Visit (INDEPENDENT_AMBULATORY_CARE_PROVIDER_SITE_OTHER): Payer: MEDICAID | Admitting: Family Medicine

## 2010-07-16 ENCOUNTER — Ambulatory Visit (INDEPENDENT_AMBULATORY_CARE_PROVIDER_SITE_OTHER): Payer: Self-pay | Admitting: Family Medicine

## 2010-07-16 VITALS — BP 152/100 | HR 78 | Temp 98.0°F

## 2010-07-16 MED ORDER — KETOROLAC 30 MG/ML (1 ML) INJECTION SOLUTION
60.0000 mg | Freq: Once | INTRAMUSCULAR | Status: AC
Start: 2010-07-16 — End: 2010-07-16

## 2010-07-16 MED ORDER — METHOCARBAMOL 750 MG TABLET
750.00 mg | ORAL_TABLET | Freq: Four times a day (QID) | ORAL | Status: DC | PRN
Start: 2010-07-16 — End: 2010-11-20

## 2010-07-16 NOTE — Progress Notes (Signed)
Medication Administration:    Medication : Toradol  Medication Dose: 60 mg/mL  Route of Administration: IM  Site: Right Gluteus  NDC #: [id number]  LOT #: [id number]  Expiration date: 12/24/11  Manufacturer: [employer]  Clinic Supplied: Yes  Patient Supplied: No  Comments:: 30 mg/mL       Marni Griffon, MA 07/16/2010, 3:33 PM

## 2010-07-16 NOTE — Progress Notes (Signed)
This is a 54 year old female who presents for an acute visit. She has history of cervical disc disease. Today, she complains of left flank and hip pain which began upon awakening this morning. It is a constant pain it is worse with movement. She has been going to physical therapy recently for her knees. She has had a normal bowel movement today and no difficulty urinating. There was no injury or trauma.    Objective: She is sitting in a wheelchair. There is exquisite tenderness to palpation in the paraspinal musculature in the lower lumbar/sacral region on the left and extending to the left hip. She has normal strength in both lower extremities. Heel and toe strength are normal. Deep tendon reflexes are normal in both lower extremities.    Assessment: Sacroiliac sprain.    Plan: Toradol 60 mg IM given in clinic today. Start Robaxin 750 mg t.i.d. p.r.n. Alternating ice and heat. Ibuprofen 800 mg t.i.d. p.r.n. Return if not improving.    [name], DO  Assistant Professor  [place] [facility] Medicine

## 2010-07-22 ENCOUNTER — Ambulatory Visit
Admission: RE | Admit: 2010-07-22 | Discharge: 2010-07-22 | Disposition: A | Discharge status: Home / Self Care | Payer: MEDICAID | Attending: Otolaryngology | Admitting: Otolaryngology

## 2010-07-22 ENCOUNTER — Ambulatory Visit (INDEPENDENT_AMBULATORY_CARE_PROVIDER_SITE_OTHER): Payer: MEDICAID | Admitting: Otolaryngology

## 2010-07-22 MED ORDER — LORATADINE 10 MG TABLET
10.0000 mg | ORAL_TABLET | Freq: Every day | ORAL | Status: DC
Start: 2010-07-22 — End: 2012-10-04

## 2010-08-18 ENCOUNTER — Encounter (INDEPENDENT_AMBULATORY_CARE_PROVIDER_SITE_OTHER): Payer: MEDICAID | Admitting: Orthopaedic Surgery of the Spine

## 2010-08-25 NOTE — Progress Notes (Signed)
[facility] Department of Otolaryngology  [address]  [place], [place] [address]      PROGRESS NOTE    PATIENT NAME: [name], [name]  CHART NUMBER: [id number]  DATE OF BIRTH: 1955/11/18  DATE OF SERVICE: 07/22/2010    SUBJECTIVE: Ms. [name] is a 54 year old woman with a history of allergic rhinitis, laryngopharyngeal reflux and multinodular goiter. She is here now for a recheck. She underwent repeat thyroid ultrasound earlier today. This showed no change in the thyroid size or nodules. She continues to have multinodular goiter with the largest nodule on the right side being 9 mm in diameter (stable). Discussed the results with her today. She is mainly bothered by nasal congestion that she has noted over the past several weeks to a months' time. She states the symptoms are usually worse in the fall. She has been using Astelin nose spray and has really not noted much improvement with this. She denies any facial pain or pressure. No fevers or chills. No vision problems or complaints. She states her reflux has been stable. She continues to take Nexium 40 mg p.o. b.i.d.    OBJECTIVE: This is a healthy-appearing, middle-aged woman in no distress. Vital Signs: Temperature is 98.1, blood pressure 133/84, pulse 95. She is 5 feet 5 inches tall, weighs 204 pounds. HEENT: Normocephalic, atraumatic. Neck is slightly obese. No obvious thyromegaly or nodules felt. No palpable lymphadenopathy. Ears: EACs have minimal cerumen. TMs otherwise normal. Nose: Septum is fairly straight. She has pale boggy mucosa, large inferior turbinates bilaterally. Thick, sticky, clear secretions consistent with allergic rhinitis. Oral Cavity: Healthy lips, teeth and gums. Oropharynx clear. Nasopharynx is clear by mirror exam, though it is difficult to look due to sensitive gag reflex. She has a relatively large tongue. I could not examine the larynx well with the mirror due to her sensitive gag reflex.     PROCEDURE NOTE: The nose was decongested with Afrin/Lidocaine and then flexible nasal laryngoscopy was performed. Scope was passed through the right naris. Nasopharynx was clear. Epiglottis was normal. She has relatively large lingual tonsils that nearly abut the epiglottis with her sitting upright position. Vallecula otherwise clear. Piriform sinuses and hypopharynx is clear. There is no pooling of secretions. Larynx is normal. Both true cords are pearly white, mobile and approximate well in the midline. The arytenoids and posterior glottis mucosa appears clear. There is no significant swelling or erythema.    ASSESSMENT AND PLAN:  1. Allergic rhinitis/seasonal allergic rhinitis. Symptoms worse in the fall. Recommend adding Claritin 10 mg p.o. daily and continue with the Astelin and saline nose sprays.  2. Laryngopharyngeal reflux, stable.  3. Multinodular goiter, stable by ultrasound. Recommend return in six months or sooner if problems.      Charmaine Downs, MD  Assistant Professor  [facility] Department of Otolaryngology    [id number]; D: 07/23/2010 14:18:56; T: 07/23/2010 17:35:51

## 2010-09-08 ENCOUNTER — Encounter (INDEPENDENT_AMBULATORY_CARE_PROVIDER_SITE_OTHER): Payer: MEDICAID | Admitting: Orthopaedic Surgery of the Spine

## 2010-09-09 ENCOUNTER — Encounter (INDEPENDENT_AMBULATORY_CARE_PROVIDER_SITE_OTHER): Payer: Self-pay | Admitting: Family Medicine

## 2010-09-09 ENCOUNTER — Ambulatory Visit (INDEPENDENT_AMBULATORY_CARE_PROVIDER_SITE_OTHER): Payer: MEDICAID | Admitting: Family Medicine

## 2010-09-09 VITALS — BP 138/82 | HR 72 | Temp 97.8°F | Ht 65.0 in | Wt 202.0 lb

## 2010-09-09 DIAGNOSIS — L639 Alopecia areata, unspecified: Secondary | ICD-10-CM

## 2010-09-09 DIAGNOSIS — Z23 Encounter for immunization: Secondary | ICD-10-CM

## 2010-09-09 DIAGNOSIS — N904 Leukoplakia of vulva: Secondary | ICD-10-CM

## 2010-09-09 DIAGNOSIS — R300 Dysuria: Secondary | ICD-10-CM

## 2010-09-09 DIAGNOSIS — L940 Localized scleroderma [morphea]: Secondary | ICD-10-CM

## 2010-09-09 MED ORDER — HALOBETASOL PROPIONATE 0.05 % TOPICAL CREAM
TOPICAL_CREAM | CUTANEOUS | Status: DC
Start: 2010-09-09 — End: 2010-11-10

## 2010-09-09 NOTE — Progress Notes (Signed)
SUBJECTIVE:  [name] is a 54 y.o. female is here today for Skin Check and Dysuria  Has patches of hair loss that she has noted  Had similar problem in the past several years ago treated by derm, remembers having a skin biopsy    Has been using ultravate on vulva for lichen sclerosis and needs skin check today  Having some increase itching and burning after urination for a few days with suprapubic discomfort and wonders if she has a UTI    I have reviewed the patient's medical, surgical, family and social history in detail today, 09/09/2010  and updated the computerized patient record as appropriate.  Current outpatient prescriptions   Medication Sig   . loratadine (CLARITIN) 10 mg Oral Tablet take 1 Tab by mouth Once a day.   . methocarbamol (ROBAXIN-750) 750 mg Oral Tablet take 1 Tab by mouth Four times a day as needed.   Marland Kitchen L-Methylfolate (DEPLIN) 15 mg Tab take 1 Tab by mouth Once a day.   . duloxetine (CYMBALTA) 60 mg CpDR take 1 Cap by mouth Once a day.   Marland Kitchen Halobetasol Propionate (ULTRAVATE) 0.05 % Cream Apply  topically Once a day.   . Zolpidem (AMBIEN) 10 mg Tab take 1 Tab by mouth Every night as needed for Insomnia.   Marland Kitchen esomeprazole magnesium (NEXIUM) 40 mg CpDR take 1 Cap by mouth Twice a day before meals.   . Diclofenac Sodium (VOLTAREN) 75 mg TbEC take 1 Tab by mouth Twice daily.    . ASTELIN 137 mcg SprA 2 Sprays by Nasal route Twice daily. Use in each nostril as directed       ROS: Pertinent items are noted in HPI.  OBJECTIVE:   Vitals: BP 138/82  Pulse 72  Temp 36.6 C (97.8 F)  Ht 1.651 m (5\' 5" )  Wt 91.627 kg (202 lb)  BMI 33.61 kg/m2   Appearance:in no apparent distress  Exam: Scalp with three areas of hair loss (vertex about 5 cm, left temporal area about 8 cm and on frontal area near midline about 1 cm)  No visible rash but c/w alopecia areata  Vulva looks better, mucosa more normal color, still some agglutination of folds anteriorly around clitorus    ASSESSMENT:   PLAN:  1.  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 Dysuria (788.1)  POCT URINE DIPSTICK       3. 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 Need for prophylactic vaccination and inoculation against influenza (V04.81)  INFLUENZA VACCINE  IM AGE 63 THROUGH ADULT (ADMIN)       Return in about 3 months (around 12/10/2010).      Advised to call back directly if there are further questions, or if these symptoms fail to improve as anticipated or worsen.  Theola Sequin, MD  [facility]  FAMILY MEDICINE-[facility]  [address]  [place] [address]  [phone]

## 2010-09-09 NOTE — Patient Instructions (Signed)
INACTIVATED INFLUENZA  VACCINE 2011-2012      WHAT YOU NEED TO KNOW  Many Vaccine Information Statements are available in Spanish and other languages. See http://www.immunize.org/vis  Hojas de lnformacin Sobre Vacunas estn disponibles en Espanol y en muchos otros idiomas. Visite http://www.immunize.org/vis   Why get vaccinated?  Influenza ("flu") is a contagious disease.  It is caused by the influenza virus, which can be spread by coughing, sneezing, or nasal secretions.    Anyone can get influenza, but rates of infection are the  highest  among children. For most people, symptoms last only a few  days. They include:  . fever . sore throat . chills . fatigue  . cough . headache . muscle aches    Other illnesses can have the same symptoms and are often mistaken for influenza.    Young children, people 65 and older, pregnant women, and people with certain health conditions - such as heart, lung or kidney disease or a weakened immune system - can get much sicker. Flu can cause high fever and pneumonia, and make existing medical conditions worse. It can cause diarrhea and seizures in children. Each year thousands of people die from seasonal influenza and even more require hospitalization.  By getting flu vaccine you can protect yourself from influenza and may also avoid spreading influenza to others.       Inactivated influenza vaccine  There are two types of influenza vaccine:  1. Inactivated (killed) vaccine, the "flu shot" is given by injection with a needle.    2. Live, attenuated (weakened) influenza vaccine is sprayed into the nostrils. This vaccine is described in a separate Vaccine Information Statement.    A "high-dose" inactivated influenza vaccine is available for people 65 years of age and older. Ask your doctor for more information.     Influenza viruses are always changing, so annual vaccination is recommended. Each year scientists try to match the viruses in the vaccine to those most likely to cause flu that year.  Flu vaccine will not prevent disease from other viruses, including flu viruses not contained in the vaccine.    It takes up to 2 weeks for protection to develop after the shot. Protection lasts about a year.    Some inactivated influenza vaccine contains a preservative called thimerosal. Thimerosal-free influenza vaccine is available. Ask your healthcare provider for more information.     Who should get inactivated influenza vaccine and when?  WHO  All people 6 months of age and older should get flu vaccine.    Vaccination is especially important for people at higher risk of severe influenza and their close contacts, including healthcare personnel and close contacts of children younger than 6 months.    WHEN  Getting the vaccine as soon as it is available. This should provide protection if the flu season comes early.You can get the vaccine as long as illness is occurring in your community.     Influenza can occur at any time, but most influenza occurs from October through May. In recent seasons, most infections have occurred in January and February. Getting vaccinated in December, or even later, will still be beneficial in most years   .  Adults and older children need one dose of influenza vaccine each year. But some children younger than 9 years of age need two doses to be protected. Ask your doctor.    Influenza vaccine may be given at the same time as other vaccines, including pneumococcal vaccine.        Some people should not get inactivated influenza vaccine or should wait     Tell your doctor if you have any severe (life-threatening) allergies, including a severe allergy to eggs. A severe allergy to any vaccine component may be a reason not to get the vaccine. Allergic reactions to influenza vaccine are rare.      Tell your doctor if you ever had a severe reaction after does of influenza vaccine.    Tell your doctor if you ever had GuillainBarr Syndrome (a severe paralytic illness, also called GB S). Your doctor will help you decide whether the vaccine is recommended for you.    People who are moderately or severely ill should usually wait until they recover before getting flu vaccine. If you are ill, talk to your doctor about whether to reschedule the vaccination. People with a mild illness can usually get the vaccine.       What are the risks from inactivated influenza vaccine?    A vaccine, like any medicine, could possibly cause serious problems, such as severe allergic reactions. The risk of a vaccine causing serious harm, or death, is extremely small.    Serious problems from inactivated influenza vaccine are very rare. The viruses in inactivated influenza vaccine have been killed, so you cannot get influenza from the vaccine.    Mild problems:  .?soreness, redness, or swelling where the shot was given  .?hoarseness; sore, red or itchy eyes; cough   Fever - Aches - Headache - Itching - Fatigue      If these problems occur, they usually begin soon after the  shot and last 1-2 days.    Moderate problems:  Young children who get inactivated flu vaccine and pneumococcal vaccine (PCV13) at the same time appear to be at increased risk for seizures caused by fever. Ask you doctor for more information.        Severe problems:  .?Life-threatening allergic reactions from vaccines are very rare. If they do occur, it is usually within a few minutes to a few hours after the shot.     .?In 1976, a type of inactivated influenza (swine flu) vaccine was associated with Guillain-Barr Syndrome (GBS). Since then, flu vaccines have not been clearly linked to GBS. However, if there is a risk of GBS from current flu vaccines, it would be no more than 1 or 2 cases per million people vaccinated. This is much lower than the risk of severe influenza, which can be prevented by vaccination.  The safety of vaccines is always being monitored. For more information, visit:  www.cdc.gov/vaccinesafety_Monitoring/Index.html  And  www.cdc.gov/vaccinesafety/Activities/Activities_Index.html    What if there is a severe reaction?  What should I look for?  Any unusual condition, such as a high fever or behavior changes. Signs of a severe allergic reaction can include difficulty breathing, hoarseness or wheezing, hives, paleness, weakness, a fast heart beat or dizziness.    What should I do?  .?Call a doctor, or get the person to a doctor right away.  .?Tell the doctor what happened, the date and time it happened, and when the vaccination was given.  .?Ask your doctor to report the reaction by  filing a Vaccine Adverse Event Reporting System  (VAERS) form. Or you can file this report through the  VAERS website at www.vaers.hhs.gov, or by calling  1-800-822-7967.  VAERS does not provide medical advice.    The National Vaccine Injury Compensation Program  The National Vaccine Injury Compensation Program (VICP) was created in 1986.      People who believe they may have been injured by a vaccine can learn about the program and about filing a claim by calling 1-800-338-2382, or visiting the VICP website at:  www.hrsa.gov/vaccinecompensation.       How can I learn more?  .?Ask your healthcare provider. They can give you the vaccine package insert or suggest other sources of information.  .?Call your local or state health department.  .?Contact the Centers for Disease Control and Prevention  (CDC):   - Call 1-800-232-4636 (1-800-CDC-INFO) or  - Visit CDC's website at www.cdc.govlflu      DEPARTMENT OF HEALTH AND HUMAN SERVICES    CENTERS FOR DISEASE CONTROL AND PREVENTION        Vaccine Information Statement (Interim)  Inactivated Influenza Vaccine (05/19/10) 42 U.S.C. 300aa-26    One brand of inactivated flu vaccine, called Afluria, should not be given to children 8 years of age or younger, except in special circumstances. An inactivated vaccine was associated with fevers and fever-related seizures in young children in [place]. Ask your healthcare provider for more information.

## 2010-09-09 NOTE — Progress Notes (Signed)
Urine Dip Results:   Time collected: 1057  Glucose: Negative  Bilirubin: Negative  Ketones: Negative  Specific Gravity: 1.020  Blood: Negative  pH: 6.0  Protein: Negative  Urobilinogen: Normal   Nitrite: Negative  Leukocytes: Negative

## 2010-10-01 ENCOUNTER — Ambulatory Visit (INDEPENDENT_AMBULATORY_CARE_PROVIDER_SITE_OTHER): Payer: MEDICAID | Admitting: Psychiatry

## 2010-10-01 MED ORDER — LEVOMEFOLATE CALCIUM 15 MG TABLET
1.0000 | ORAL_TABLET | Freq: Every day | ORAL | Status: DC
Start: 2010-10-01 — End: 2010-11-20

## 2010-10-01 MED ORDER — DULOXETINE 60 MG CAPSULE,DELAYED RELEASE
60.0000 mg | DELAYED_RELEASE_CAPSULE | Freq: Every day | ORAL | Status: DC
Start: 2010-10-01 — End: 2010-12-31

## 2010-10-01 NOTE — Progress Notes (Signed)
[name]  [id number]  12-19-1955  10/01/2010    Chief Complaint: Follow up    Subjective:  Patient reports that she recently took a vacation with her sisters, but she reports that they didn't get along very well and feelings were hurt, but they did wind up making amends. She reports that she is "stressed out big time." She is losing her hair due to a medical condition, but this is frustrating to her. She feels that she is handling it as well as can be expected. She will be seeing a dermatologist on December 13th. She denies SI/HI, paranoia, delusions. She is also having stress because her oldest daughter is in an abusive relationship, in spite of her mother's efforts to rescue her from the relationship in the past. She also reports that she is having stress because her son took away a grandson she had raised and gave him to his other grandmother. She had another grandson who was born yesterday in [place] and is healthy.    She reports that she has been dealing with the stress fairly well and is sleeping better than ever.    Patient does mention that therapy was somewhat helpful in the past.      Objective:    Mental Status Examination  Appearance: Casually dressed, well groomed, good hygiene  Behavior: Pleasant and cooperative  Level of consciousness: Alert  Orientation: Grossly normal  Motor: No movement abnormalities  Speech: Regular rate, rhythm, and volume  Attention: Adequate for conversation, no excessive distractibility  Eye contact: Good  Memory: No impairment of immediate, delayed, or distant recall  Mood: "stressed out"  Affect: constricted and congruent with mood  Thought process: Linear and goal-directed  Thought content: Appropriate, no paranoia, no delusions  Perception: No audiovisual hallucinations  Suicide ideation: None  Homicide ideation: None        Medications:  Current outpatient prescriptions   Medication Sig    . Halobetasol Propionate (ULTRAVATE) 0.05 % Apply externally Cream Apply  topically. Three times a week   . loratadine (CLARITIN) 10 mg Oral Tablet take 1 Tab by mouth Once a day.   . methocarbamol (ROBAXIN-750) 750 mg Oral Tablet take 1 Tab by mouth Four times a day as needed.   Marland Kitchen L-Methylfolate (DEPLIN) 15 mg Tab take 1 Tab by mouth Once a day.   . duloxetine (CYMBALTA) 60 mg CpDR take 1 Cap by mouth Once a day.   . Zolpidem (AMBIEN) 10 mg Tab take 1 Tab by mouth Every night as needed for Insomnia.   Marland Kitchen esomeprazole magnesium (NEXIUM) 40 mg CpDR take 1 Cap by mouth Twice a day before meals.   . Diclofenac Sodium (VOLTAREN) 75 mg TbEC take 1 Tab by mouth Twice daily.    . ASTELIN 137 mcg SprA 2 Sprays by Nasal route Twice daily. Use in each nostril as directed           Assessment:  Axis I: Major Depressive Disorder  Axis II: None  Axis III: rectocele repair, bilateral knee arthritis, neck mass, GERD, pending r/o H. Pylori, history of multinodular goiter, migraines  Axis IV: medical illness  Axis V: GAF: 65    Plan:    Continue Cymbalta 60 mg daily and Deplin 15 mg daily. RTC in 3 months.  Patient advised to call with any questions or concerns.    Farris Has. Willamae Demby, MD 10/01/2010, 3:09 PM

## 2010-10-02 NOTE — Progress Notes (Signed)
I saw and evaluated the patient. I reviewed the resident's note. I agree with the findings and plan of care as documented in the resident's note. Any exceptions/additions are noted.      Germaine Pomfret, MD 10/02/2010, 3:37 PM

## 2010-10-06 ENCOUNTER — Encounter (INDEPENDENT_AMBULATORY_CARE_PROVIDER_SITE_OTHER): Payer: MEDICAID | Admitting: Dermatology

## 2010-10-20 ENCOUNTER — Encounter (INDEPENDENT_AMBULATORY_CARE_PROVIDER_SITE_OTHER): Payer: Self-pay | Admitting: Orthopaedic Surgery of the Spine

## 2010-10-20 ENCOUNTER — Ambulatory Visit (INDEPENDENT_AMBULATORY_CARE_PROVIDER_SITE_OTHER): Payer: MEDICAID | Admitting: Orthopaedic Surgery of the Spine

## 2010-10-20 DIAGNOSIS — G5600 Carpal tunnel syndrome, unspecified upper limb: Secondary | ICD-10-CM

## 2010-10-20 DIAGNOSIS — R292 Abnormal reflex: Secondary | ICD-10-CM

## 2010-10-20 NOTE — Progress Notes (Signed)
To be dictated.  The patient was seen as a shared visit with the co-signing faculty.

## 2010-10-22 NOTE — Progress Notes (Signed)
[facility] Department of Orthopaedics  [address]  [place], [place] [address]      PROGRESS NOTE    PATIENT NAME: [name], [name]  CHART NUMBER: [id number]  DATE OF BIRTH: July 04, 1956  DATE OF SERVICE: 10/20/2010    SUBJECTIVE:  Sheli comes in today so we can check her reflexes.  She has been wearing her wrist cockup splints at night, but they are not having quite the effect that they used to.  She is having a lot of complaints of pain in her right wrist with pain that radiates into her thumb, index and middle fingers.  It bothers her quite a bit at night and with other activities.  She is quite fed up with that.  She has not noticed any other change in her symptoms really.  Her gait is fine.  She has no radiating arm pain except from that of the wrist.  She is not complaining of any weakness in her arms.    OBJECTIVE:  Alert and oriented x3.  Respirations are unlabored without wheeze.  The patient has no weakness on muscular exam.  She does have sort of 3's with her triceps, biceps, brachioradialis bilaterally.  She does have just a very mildly positive thumb Hoffmann bilaterally.    ASSESSMENT/PLAN:  The patient was brought in today because she has some abnormalities with her reflexes.  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  The wrist splints helped quite a bit and so we feel this is more carpal tunnel than anything in her right hand.  We will have her follow up Dr. Malachi Paradise so she can be evaluated for that.  As far as we are concerned, we will plan on seeing her back on an as-needed basis and if she feels like she is getting worse or has any concerns, all she has to do is give [place] a call and we would be glad to see her.      Vance Gather, PA-C  [place] Department of Orthopaedics    John Guinea-Bissau, MD  Professor  [facility] Department of Orthopaedics    [id number]; D: 10/20/2010 15:03:25; T: 10/20/2010 16:25:42     cc: Theola Sequin MD      Shirleen Schirmer

## 2010-10-25 DIAGNOSIS — N8110 Cystocele, unspecified: Secondary | ICD-10-CM

## 2010-10-25 HISTORY — PX: HX HAND SURGERY: 2100001299

## 2010-10-25 HISTORY — DX: Cystocele, unspecified: N81.10

## 2010-11-10 ENCOUNTER — Ambulatory Visit (INDEPENDENT_AMBULATORY_CARE_PROVIDER_SITE_OTHER): Payer: MEDICAID | Admitting: SURGERY OF THE HAND

## 2010-11-10 ENCOUNTER — Encounter (INDEPENDENT_AMBULATORY_CARE_PROVIDER_SITE_OTHER): Payer: Self-pay | Admitting: SURGERY OF THE HAND

## 2010-11-10 ENCOUNTER — Ambulatory Visit
Admission: RE | Admit: 2010-11-10 | Discharge: 2010-11-10 | Disposition: A | Discharge status: Home / Self Care | Payer: MEDICAID | Source: Ambulatory Visit | Attending: SURGERY OF THE HAND | Admitting: SURGERY OF THE HAND

## 2010-11-10 ENCOUNTER — Ambulatory Visit (HOSPITAL_BASED_OUTPATIENT_CLINIC_OR_DEPARTMENT_OTHER): Payer: MEDICAID

## 2010-11-10 VITALS — BP 138/90 | HR 90 | Temp 97.6°F | Ht 66.0 in | Wt 195.0 lb

## 2010-11-10 DIAGNOSIS — Z87891 Personal history of nicotine dependence: Secondary | ICD-10-CM | POA: Insufficient documentation

## 2010-11-10 DIAGNOSIS — R209 Unspecified disturbances of skin sensation: Secondary | ICD-10-CM | POA: Insufficient documentation

## 2010-11-10 DIAGNOSIS — G5600 Carpal tunnel syndrome, unspecified upper limb: Secondary | ICD-10-CM | POA: Insufficient documentation

## 2010-11-10 DIAGNOSIS — M79609 Pain in unspecified limb: Secondary | ICD-10-CM | POA: Insufficient documentation

## 2010-11-10 DIAGNOSIS — K219 Gastro-esophageal reflux disease without esophagitis: Secondary | ICD-10-CM | POA: Insufficient documentation

## 2010-11-10 NOTE — Progress Notes (Signed)
See dictated note.

## 2010-11-19 NOTE — H&P (Signed)
[facility]   DEPARTMENT OF [place], [place] [address]     PATIENT NAME: [name], [name] [facility] [id number]  DATE OF SERVICE:11/10/2010  DATE OF BIRTH: May 05, 1956    HISTORY AND PHYSICAL    SUBJECTIVE: [name] come in today as referral from Dr. Guinea-Bissau for evaluation of bilateral hand and arm pain. This is a patient who was seeing Dr. Guinea-Bissau and they were concerned about numbness and tingling in her hands, particularly on the right side. Her exam from Dr. Guinea-Bissau indicated that some of her symptoms in the left side could be coming from her neck but there was nothing on her right side that would produce the carpal tunnel-type symptoms she is having. Therefore, she was referred to [place] for evaluation. We looked at her today. She states that she did have wrist splints for a time which did help but now these do not help her so much. She does wake up at night with her thumb, index and middle finger and tingling and she has to shake those to get rid of the symptoms bilaterally. She has some difficulties using the hand with certain activities causing numbness and tingling in her median nerve distribution. It has been going on for several years. Her Phalen's test is positive in about 20 seconds for numbness and tingling in the bilateral hands, right greater than the left side. At this point in time, given her symptoms and the duration, we believe she does have carpal tunnel syndrome in the right hand and our recommendation is to proceed with a right carpal tunnel release in the future, which we discussed with the patient and she does wish to proceed with that.    PAST MEDICAL HISTORY: The patient suffers from GERD.     PAST SURGICAL HISTORY: The patient has had multiple surgeries, the last appearing to be a gastroscopy in May 2011. She has had a colonoscopy in 2010. She has a cystocele repair. She has had an ankle fracture treated as well as a wrist fracture treated with surgery. Adenoidectomy and tonsillectomy.    SOCIAL HISTORY: The patient denies the use of tobacco products at this time however she was a former smoker, smoking 2 packs a day. She denies alcoholic beverages. The patient currently does not work, she is not married.    MEDICATIONS: The patient current takes:  1. Deplin.  2. Nexium.  3. Cymbalta.    ALLERGIES: The patient is allergic to codeine, which caused a rash and Mobic, which caused her to be sick.    FAMILY HISTORY: Significant for eye problems, bladder problems, diabetes, digestive bowel problems, hypertension.    REVIEW OF SYSTEMS: Noncontributory to her carpal tunnel and can be found on outpatient history and physical.    OBJECTIVE: On physical exam, the patient was alert and oriented x3. Vital signs include a blood pressure of 138/90, pulse 90, respirations 14, temperature 97.6 degrees Fahrenheit. She is 5 feet 6 inches, weighs 195 pounds. Skin is warm, dry and intact. No signs or symptoms of infection. Head, ears, eyes, nose, throat were clear. Her cap refill is brisk, distal pulses both equal bilaterally. Breathing is nonlabored. She was in no acute distress. Neuro exam of her hands and upper extremities as described above.     IMPRESSION: Bilateral carpal tunnel syndrome, right greater than left.    PLAN: At this time, we recommended surgery to include right carpal tunnel release. We discussed this with the patient and  she wants to proceed. We will give her information to [facility] and get this set up in the near future.      Bea Graff, PA-C  [place] Department of Orthopaedics    Armandina Gemma, MD  Professor  [facility] Department of Orthopaedics    [id number]; D: 11/19/2010 09:11:03; T: 11/19/2010 09:33:32

## 2010-11-20 ENCOUNTER — Ambulatory Visit (INDEPENDENT_AMBULATORY_CARE_PROVIDER_SITE_OTHER)
Admission: RE | Admit: 2010-11-20 | Discharge: 2010-11-20 | Disposition: A | Discharge status: Home / Self Care | Payer: MEDICAID | Source: Ambulatory Visit | Admitting: Rheumatology

## 2010-11-20 ENCOUNTER — Encounter (INDEPENDENT_AMBULATORY_CARE_PROVIDER_SITE_OTHER): Payer: Self-pay

## 2010-11-20 ENCOUNTER — Ambulatory Visit (INDEPENDENT_AMBULATORY_CARE_PROVIDER_SITE_OTHER): Payer: MEDICAID | Admitting: Dermatology

## 2010-11-20 ENCOUNTER — Ambulatory Visit
Admission: RE | Admit: 2010-11-20 | Discharge: 2010-11-20 | Disposition: A | Discharge status: Home / Self Care | Payer: MEDICAID | Source: Ambulatory Visit | Attending: SURGERY OF THE HAND | Admitting: SURGERY OF THE HAND

## 2010-11-20 ENCOUNTER — Encounter (HOSPITAL_COMMUNITY): Payer: Self-pay

## 2010-11-20 ENCOUNTER — Ambulatory Visit (INDEPENDENT_AMBULATORY_CARE_PROVIDER_SITE_OTHER): Payer: MEDICAID

## 2010-11-20 VITALS — BP 121/87 | HR 101 | Temp 98.3°F

## 2010-11-20 VITALS — BP 118/84 | Wt 199.8 lb

## 2010-11-20 DIAGNOSIS — R51 Headache: Secondary | ICD-10-CM | POA: Insufficient documentation

## 2010-11-20 DIAGNOSIS — F3289 Other specified depressive episodes: Secondary | ICD-10-CM | POA: Insufficient documentation

## 2010-11-20 DIAGNOSIS — G5600 Carpal tunnel syndrome, unspecified upper limb: Secondary | ICD-10-CM | POA: Insufficient documentation

## 2010-11-20 DIAGNOSIS — L299 Pruritus, unspecified: Secondary | ICD-10-CM | POA: Insufficient documentation

## 2010-11-20 DIAGNOSIS — L659 Nonscarring hair loss, unspecified: Secondary | ICD-10-CM | POA: Insufficient documentation

## 2010-11-20 DIAGNOSIS — K219 Gastro-esophageal reflux disease without esophagitis: Secondary | ICD-10-CM | POA: Insufficient documentation

## 2010-11-20 DIAGNOSIS — E042 Nontoxic multinodular goiter: Secondary | ICD-10-CM | POA: Insufficient documentation

## 2010-11-20 DIAGNOSIS — Z87891 Personal history of nicotine dependence: Secondary | ICD-10-CM | POA: Insufficient documentation

## 2010-11-20 HISTORY — DX: Depression, unspecified: F32.A

## 2010-11-20 LAB — THYROID STIMULATING HORMONE WITH FREE T4 REFLEX: THYROID STIMULATING HORMONE WITH FREE T4 REFLEX: 1.1 u[IU]/mL (ref 0.300–5.900)

## 2010-11-20 LAB — BASIC METABOLIC PANEL
ANION GAP: 12 mmol/L (ref 5–16)
BUN/CREAT RATIO: 13 (ref 6–22)
BUN: 13 mg/dL (ref 6–20)
CALCIUM: 9.5 mg/dL (ref 8.5–10.4)
CARBON DIOXIDE: 25 mmol/L (ref 22–32)
CHLORIDE: 103 mmol/L (ref 96–111)
CREATININE: 0.97 mg/dL (ref 0.49–1.10)
ESTIMATED GLOMERULAR FILTRATION RATE: >59 ml/min/1.73m2 (ref >59)
GLUCOSE,NONFAST: 93 mg/dL (ref 65–139)
POTASSIUM: 4.3 mmol/L (ref 3.5–5.1)
SODIUM: 140 mmol/L (ref 136–145)

## 2010-11-20 LAB — PERFORM POC WHOLE BLOOD GLUCOSE: GLUCOSE, POINT OF CARE: 108 mg/dL — ABNORMAL HIGH (ref 70–105)

## 2010-11-20 NOTE — Progress Notes (Signed)
I saw and evaluated the patient. I reviewed the resident's note. I agree with the findings and plan of care as documented in the resident's note. Any exceptions/additions are noted.      Clarene Critchley, MD 11/20/2010, 4:07 PM

## 2010-11-20 NOTE — H&P (Signed)
See dictated note.

## 2010-11-20 NOTE — Anesthesia Preprocedure Evaluation (Addendum)
Additional History Findings:  Patient / Family anesthesia-related difficulties: No   LMP: N/A   Other: No       Airway   Mallampati: I  TM distance: >3 FB  Neck ROM: full  Mouth Opening: good.  Dental     (+) upper dentures       CTAB  H/o Rheumatic HD  R/B discussed  Upper dentures  Lower poor dentition                  Physical Exam:  Lungs: negative findings: lungs clear to auscultation  Heart: negative findings: regular rate and rhythm  Other: No    EKG Ordered:  11/20/2010   CXR: Not ordered  Other Studies:labs 11/20/2010    Consults: None  Follow-Up: None      Anesthesia type: MAC    ASA 2        Patient's NPO status is appropriate for Anesthesia.      Anesthetic plan and risks discussed with patient.      Anesthesia issues/risks discussed are: Dental Injuries, PONV and Post-op Pain Management.      Plan discussed with CRNA.              Patient instructed to take the following medications day of surgery, nexium, cymbalta, claritin.  Remain NPO 8 hrs prior to arrival the AM of surgery.

## 2010-11-24 LAB — THYROID-STIMULATING IMMUNOGLOBULIN (TSI), SERUM: THYROID STIM. IMMUN QNT: <89

## 2010-11-25 ENCOUNTER — Ambulatory Visit (INDEPENDENT_AMBULATORY_CARE_PROVIDER_SITE_OTHER): Payer: MEDICAID | Admitting: Dermatology

## 2010-11-27 MED ORDER — CEFAZOLIN 10 GRAM SOLUTION FOR INJECTION
2.00 g | Freq: Once | INTRAMUSCULAR | Status: DC
Start: 2010-11-30 — End: 2010-11-30
  Filled 2010-11-27: qty 20

## 2010-11-30 ENCOUNTER — Ambulatory Visit (HOSPITAL_BASED_OUTPATIENT_CLINIC_OR_DEPARTMENT_OTHER): Payer: MEDICAID | Admitting: SURGERY OF THE HAND

## 2010-11-30 ENCOUNTER — Encounter (HOSPITAL_COMMUNITY)
Admission: RE | Disposition: A | Discharge status: Home / Self Care | Payer: Self-pay | Source: Ambulatory Visit | Attending: SURGERY OF THE HAND

## 2010-11-30 ENCOUNTER — Encounter (HOSPITAL_COMMUNITY): Payer: Self-pay | Admitting: NURSE PRACTITIONER

## 2010-11-30 ENCOUNTER — Encounter (HOSPITAL_COMMUNITY): Payer: Self-pay

## 2010-11-30 ENCOUNTER — Inpatient Hospital Stay
Admission: RE | Admit: 2010-11-30 | Discharge: 2010-11-30 | Disposition: A | Discharge status: Home / Self Care | Payer: MEDICAID | Source: Ambulatory Visit | Attending: SURGERY OF THE HAND | Admitting: SURGERY OF THE HAND

## 2010-11-30 ENCOUNTER — Ambulatory Visit (HOSPITAL_COMMUNITY): Payer: MEDICAID | Admitting: PEDIATRIC ANESTHESIOLOGY

## 2010-11-30 DIAGNOSIS — G5600 Carpal tunnel syndrome, unspecified upper limb: Secondary | ICD-10-CM | POA: Insufficient documentation

## 2010-11-30 SURGERY — RELEASE CARPAL TUNNEL
Anesthesia: Monitor Anesthesia Care | Site: Wrist | Laterality: Right | Wound class: Clean Wound: Uninfected operative wounds in which no inflammation occurred

## 2010-11-30 MED ORDER — SODIUM CHLORIDE 0.9 % (FLUSH) INJECTION SYRINGE
2.00 mL | INJECTION | INTRAMUSCULAR | Status: DC | PRN
Start: 2010-11-30 — End: 2010-11-30

## 2010-11-30 MED ORDER — LACTATED RINGERS INTRAVENOUS SOLUTION
INTRAVENOUS | Status: DC
Start: 2010-11-30 — End: 2010-11-30
  Administered 2010-11-30: 0 via INTRAVENOUS

## 2010-11-30 MED ORDER — HYDROMORPHONE (PF) 1 MG/ML INJECTION SOLUTION
0.2000 mg | INTRAMUSCULAR | Status: DC | PRN
Start: 2010-11-30 — End: 2010-11-30

## 2010-11-30 MED ORDER — HYDROCODONE 7.5 MG-ACETAMINOPHEN 500 MG TABLET
1.00 | ORAL_TABLET | ORAL | Status: DC | PRN
Start: 2010-11-30 — End: 2010-12-31

## 2010-11-30 MED ORDER — BUPIVACAINE-EPINEPHRINE (PF) 0.5 %-1:200,000 INJECTION SOLUTION
30.00 mL | Freq: Once | INTRAMUSCULAR | Status: DC | PRN
Start: 2010-11-30 — End: 2010-11-30
  Administered 2010-11-30: 5 mL via INTRAMUSCULAR
  Filled 2010-11-30: qty 30

## 2010-11-30 MED ORDER — KETOROLAC 30 MG/ML (1 ML) INJECTION SOLUTION
30.0000 mg | Freq: Once | INTRAMUSCULAR | Status: DC | PRN
Start: 2010-11-30 — End: 2010-11-30

## 2010-11-30 MED ORDER — SODIUM CHLORIDE 0.9 % (FLUSH) INJECTION SYRINGE
2.00 mL | INJECTION | Freq: Three times a day (TID) | INTRAMUSCULAR | Status: DC
Start: 2010-11-30 — End: 2010-11-30

## 2010-11-30 MED ORDER — MIDAZOLAM 1 MG/ML INJECTION SOLUTION
INTRAMUSCULAR | Status: AC
Start: 2010-11-30 — End: 2010-11-30
  Filled 2010-11-30: qty 2

## 2010-11-30 MED ORDER — LACTATED RINGERS INTRAVENOUS SOLUTION
INTRAVENOUS | Status: DC
Start: 2010-11-30 — End: 2010-11-30

## 2010-11-30 SURGICAL SUPPLY — 8 items
DISCONTINUED USE ITEM 102436 - NEEDLE HYPO 22GA 1.5IN REG WL_BD POLYPROP REG BVL LL HUB (NEEDLES & SYRINGE SUPPLIES) ×1 IMPLANT
KIT RM TURNOVER CLEANOP CSTM INFCT CONTROL (KITS & TRAYS (DISPOSABLE)) ×1
KIT RM TURNOVER CLEANOP CUSTOM INFCT CONTROL (KITS & TRAYS (DISPOSABLE)) ×1 IMPLANT
PACK SURG EXTREMITY I STRL LF (CUSTOM TRAYS & PACK) ×1 IMPLANT
PACK SURG UTC HAND STRL DISP LF (CUSTOM TRAYS & PACK) ×1 IMPLANT
SPONGE GAUZE STRL 4 X 4IN TUB_6939 1280/CS (WOUND CARE SUPPLY) ×1 IMPLANT
TRAY SKIN SCRUB 8IN VNYL COTTON 6 WNG 6 SPONGE STICK 2 TIP APPL DRY STRL LF (KITS & TRAYS (DISPOSABLE)) ×1 IMPLANT
TRAY SURG PREP SCR CR ESTM (KITS & TRAYS (DISPOSABLE)) ×1

## 2010-11-30 NOTE — Discharge Instructions (Signed)
SURGICAL DISCHARGE INSTRUCTIONS     Dr. Armandina Gemma, MD  performed your RELEASE CARPAL TUNNEL today at the [facility]    [facility]:  Monday through Friday from 6 a.m. - 7 p.m.: [phone]  Between 7 p.m. - 6 a.m., weekends and holidays:  Call [facility] at [phone] or [phone].    PLEASE SEE WRITTEN HANDOUTS AS DISCUSSED BY YOUR NURSE:      SIGNS AND SYMPTOMS OF A WOUND / INCISION INFECTION   Be sure to watch for the following:   Increase in redness or red streaks near or around the wound or incision.   Increase in pain that is intense or severe and cannot be relieved by the pain medication that your doctor has given you.   Increase in swelling that cannot be relieved by elevation of a body part, or by applying ice, if permitted.   Increase in drainage, or if yellow / green in color and smells bad. This could be on a dressing or a cast.   Increase in fever for longer than 24 hours, or an increase that is higher than 101 degrees Fahrenheit (normal body temperature is 98 degrees Fahrenheit). The incision may feel warm to the touch.    **CALL YOUR DOCTOR IF ONE OR MORE OF THESE SIGNS / SYMPTOMS SHOULD OCCUR.    ANESTHESIA INFORMATION   ANESTHESIA -- ADULT PATIENTS:  You have received intravenous sedation / general anesthesia, and you may feel drowsy and light-headed for several hours. You may even experience some forgetfulness of the procedure. DO NOT DRIVE A MOTOR VEHICLE or perform any activity requiring complete alertness or coordination until you feel fully awake in about 24-48 hours. Do not drink alcoholic beverages for at least 24 hours. Do not stay alone, you must have a responsible adult available to be with you. You may also experience a dry mouth or nausea for 24 hours. This is a normal side effect and will disappear as the effects of the medication wear off.    REMEMBER   If you experience any difficulty breathing, chest pain, bleeding that you feel is  excessive, persistent nausea or vomiting or for any other concerns:  Call your physician Dr. Deno Lunger at [phone] or [phone]. You may also ask to have the  doctor on call paged. They are available to you 24 hours a day.    SPECIAL INSTRUCTIONS / COMMENTS   Keep dressing dry.  Keep area elevated.  No heavy lifting.  No strenous activity.  No jewelry to affected hand.    FOLLOW-UP APPOINTMENTS   Please call patient services at [phone] or [phone] to schedule a date / time of return. They are open Monday - Friday from 7:30 am - 5:00 pm.

## 2010-11-30 NOTE — Anesthesia Postprocedure Evaluation (Signed)
ANESTHESIA POSTOP EVALUATION NOTE        Anesthesia Service      [facility]     11/30/2010      Last Vitals: Temperature: 36.4 C (97.5 F) (11/30/10 1040)  Heart Rate: 87  (11/30/10 1040)  BP (Non-Invasive): 108/71 mmHg (11/30/10 1040)  Respiratory Rate: 16  (11/30/10 1040)  SpO2-1: 98 % (11/30/10 1040)  Weight: 90 kg (198 lb 6.6 oz) (11/30/10 0802)  Procedure(s) Performed:  RELEASE CARPAL TUNNEL - with local    Patient is sufficiently recovered from the effects of anesthesia to participate in the evaluation and has returned to their pre-procedure level.  I have reviewed and evaluated the following:  Respiratory Function: Consistent with pre anesthetic level  Cardiovascular Function: Consistent with pre anesthetic level  Mental Status: Return to pre anesthetic baseline level    Comment/ re-evaluation for any variations: None    Faculty Note:  I examined the patient and agree with the above details of postoperative assessment.

## 2010-11-30 NOTE — H&P (Addendum)
[facility]                                                     H&P Update Form    [name], [name], 55 y.o. female  Date of Admission:  11/30/2010  Date of Birth:  06/24/1956    11/30/2010    STOP: IF H&P IS GREATER THAN 30 DAYS FROM SURGICAL DAY COMPLETE NEW H&P IS REQUIRED.    Outpatient Pre-Surgical H & P updated the day of the procedure.  1.  H&P completed within 30 days of surgical procedure was performed by Dr. Deno Lunger on 11/20/2010 and has been reviewed, the patient has been examined, and no change has occured in the patients condition since the H&P was completed.       Change in medications: No      Last Menstrual Period: Not applicable      Comments:     2.  Patient continues to be appropiate candidate for planned surgical procedure. YES      John Christian Hagedorn II, MD    .ll

## 2010-11-30 NOTE — OR Surgeon (Addendum)
[facility]                                                     BRIEF OPERATIVE NOTE    Patient Name: [facility] Number: [id number]  Date of Service: 11/30/2010   Date of Birth: 10/29/1955      Pre-Operative Diagnosis: Right carpal tunnel syndrome     Post-Operative Diagnosis: Same    Procedure(s)/Description:  Right carpal tunnel release    Findings: TT:  1 min    Attending Surgeon:  Deno Lunger      Assistant(s): Evlyn Clines    Anesthesia Type: Monitored Local Anesthesia with Sedation    Estimated Blood Loss:  Minimal     Blood Given: None          Fluids Given: 900 cc LR    Complications:  None    Tubes: None    Drains: None           Specimens/ Cultures: None           Implants: None           Disposition: PACU - hemodynamically stable.           Condition: stable      Bea Graff, PA-C 11/30/2010, 10:43 AM      This document has been reviewed by me and I agree with it's findings and plans as written.  Armandina Gemma, MD 12/01/2010, 8:41 AM

## 2010-11-30 NOTE — Progress Notes (Addendum)
Post op check     D/C note      Patient awake in RR  VSS, AF, NVI  Capillary refill brisk      D/C to home today  F/U with Dr. Deno Lunger in two weeks.      Bea Graff, PA-C 11/30/2010, 10:43 AM  This document has been reviewed by me and I agree with it's findings and plans as written.  Armandina Gemma, MD 12/01/2010, 8:40 AM

## 2010-11-30 NOTE — OR PostOp (Signed)
CMS checks good; able to wiggle fingers on command.  Elevation given to arm & ice applied.  No c/o pain.  RX on chart.  Dr. Clide Cliff spoke to pt. About procedure.

## 2010-11-30 NOTE — OR PostOp (Signed)
Pt. Left via [facility] w/ staff assist, pca Anna.  All belongings w/ pt. Upon d/c.  No c/o voiced.   Daughter picking her up.

## 2010-12-01 NOTE — OR Surgeon (Signed)
[facility]   DEPARTMENT OF ORTHOPAEDICS   OPERATION SUMMARY    PATIENT NAME: [name], [name] [facility] [id number]  DATE OF SERVICE:11/30/2010  DATE OF BIRTH: [birth year]    PREOPERATIVE DIAGNOSIS: Carpal tunnel syndrome, right hand.    POSTOPERATIVE DIAGNOSIS: Carpal tunnel syndrome, right hand.    NAME OF PROCEDURE: Carpal tunnel release, right hand.    SURGEONS: Armandina Gemma MD (staff), Bea Graff PA-C (assistant).    DESCRIPTION OF PROCEDURE: [name] was brought into the operating room and positioned in the supine position where sedation by anesthesia was done followed by a median nerve block as well as local anesthetic. Anesthetics were instilled by me using 0.5% Marcaine with epinephrine.    The right upper extremity was then prepped and draped in the usual sterile fashion. The tourniquet was inflated to 250 mmHg after the right upper extremity was exsanguinated with an Esmarch bandage.    The usual longitudinal incision for the carpal tunnel was made on the third web space line. Through the sharp and dull dissection, the transverse carpal ligament was identified and incised totally. The median nerve was examined along with examination of the motor branch. There was some hour glassing of the median nerve and therefore, we did a median neurotomy.     The tourniquet was deflated. Hemostasis was obtained. The skin was closed with 5-0 nylon followed by a compressive dressing. [name] returned to the recovery room having tolerated the procedure well.    She will be followed in orthopaedic hand clinic in 2 weeks.      Armandina Gemma, MD  Professor  [facility] Department of Orthopaedics    [id number]; D: 12/01/2010 08:15:59; T: 12/01/2010 12:52:04

## 2010-12-02 NOTE — Progress Notes (Signed)
I personally saw and examined the patient. I agree with the plan of care as documented. See the mid-level provider's note for full details.   Damascus Feldpausch Dale Allana Shrestha, MD

## 2010-12-02 NOTE — H&P (Signed)
[facility]   DEPARTMENT OF [place], [place] [address]     PATIENT NAME: [name], [name] [facility] [id number]  DATE OF SERVICE:11/20/2010  DATE OF BIRTH: 05-11-1956    HISTORY AND PHYSICAL    HISTORY OF PRESENT ILLNESS: Cade comes today for history and physical and have surgery scheduled for November 25, 2010. When Ms. Grandmaison was seen the last time she was referred to [place] by Dr. Guinea-Bissau for possible carpal tunnel in her right hand. She was having some numbness and tingling and such there. She thought it was coming from her neck. He evaluated her and really there was nothing prevalent that would be causing her right-sided symptoms. This continues to wake her up at night and involves the middle, and index fingers as well as the thumb. At this point in time, we are confident she has carpal tunnel syndrome. We have recommended surgery at this time and she does wish to proceed. It does wake her up every night and she has to shake her hand in order to get rid of it.    PAST MEDICAL HISTORY: The patient suffers from headaches and a history of rheumatic fever at age 55, GERD and depression.    PAST SURGICAL HISTORY: The patient has had multiple surgeries in the past and the last appears to be a gastrostomy with biopsy Mar 05, 2010. She had ulnar nerve transposition in 1979. She had an upper endoscopy in 2009. She had a colonoscopy in March 2010. She had a cystocele repair in December 2010. She had surgery for ankle and wrist fractures in 2007. She had a partial hysterectomy, cholecystectomy, tonsillectomy and adenoidectomy. She denies any anesthesia problems with any of her surgeries.    SOCIAL HISTORY: The patient used to smoke cigarettes but she quit in 2007. She denies the use of alcoholic beverages.    MEDICATIONS:   1. Cymbalta.   2. Claritin.   3. Nexium.   4. Voltaren.     ALLERGIES: The patient is allergic to Mobic and codeine. She is able to take hydrocodone and/or Percocet without any difficulties.    REVIEW OF SYSTEMS: The patient denies any recent weight gain, weight loss, fevers, chills, fatigue, night sweats, denies any sore throat or dysphagia. No cough or shortness of breath. No chest pain or dyspnea on exertion. No abdominal pain, nausea, vomiting or diarrhea.    PHYSICAL EXAMINATION: The patient is alert and oriented x3. Her vital signs include a blood pressure 121/87, pulse 101, respirations 14, temperature 98.3 degrees Fahrenheit. Skin is warm, dry and intact. No signs or symptoms of infection. Head, ears, eyes, nose and throat were clear. Lungs were clear to auscultation bilaterally negative for wheeze, rales or rhonchi. Heart had regular rate and rhythm without murmur, negative for thrills or gallops. Abdomen: Soft, nontender, nondistended, bowel sounds present in all 4 quadrants, negative for organomegaly or rebound tenderness.    IMPRESSION: Right hand carpal tunnel syndrome.    PLAN: At this time we recommend surgery to include a right carpal tunnel release. We discussed this with the patient in detail today including the procedure itself. We discussed the benefits of surgery. We talked about the risk involved with surgery as well as the risks of anesthesia. We talked about other surgical and nonsurgical treatment options as well as their benefits and risks. The patient voiced understanding of these. All of her questions were answered to her satisfaction. She wanted to proceed with surgery. I  believe this constitutes informed consent. She signed a consent and release form which we have possession of and we will see her on November 25, 2009, for surgery.      Bea Graff, PA-C  [place] Department of Orthopaedics    Heide Guile, MD  Assistant Professor  [facility] Department of Orthopaedics    [id number]; D: 11/20/2010 17:36:40; T: 11/20/2010 17:57:30

## 2010-12-10 ENCOUNTER — Ambulatory Visit: Payer: MEDICAID | Attending: Family Medicine | Admitting: Family Medicine

## 2010-12-10 VITALS — BP 134/78 | HR 84 | Temp 98.5°F | Ht 65.0 in | Wt 202.0 lb

## 2010-12-10 DIAGNOSIS — N904 Leukoplakia of vulva: Secondary | ICD-10-CM

## 2010-12-10 DIAGNOSIS — L940 Localized scleroderma [morphea]: Secondary | ICD-10-CM | POA: Insufficient documentation

## 2010-12-15 ENCOUNTER — Ambulatory Visit: Payer: MEDICAID | Attending: SURGERY OF THE HAND | Admitting: SURGERY OF THE HAND

## 2010-12-15 ENCOUNTER — Ambulatory Visit (INDEPENDENT_AMBULATORY_CARE_PROVIDER_SITE_OTHER): Payer: Self-pay | Admitting: GENERAL

## 2010-12-15 ENCOUNTER — Telehealth (INDEPENDENT_AMBULATORY_CARE_PROVIDER_SITE_OTHER): Payer: Self-pay | Admitting: GENERAL

## 2010-12-15 NOTE — Telephone Encounter (Signed)
[place] Medicaid will no longer cover Nexium.  The preferred agents are generic Prilosec, generic Protonix, and Dexilant.  All three agents must first be attempted at maximum dosing (unless an adverse reaction occurs) for at least 60 days, including 30 days with a concurrent H2 blocker.  Can you please send one of the preferred agents to pharmacy.  Thank you Marinda Elk, LPN 1/61/0960, 45:40 AM

## 2010-12-15 NOTE — Progress Notes (Signed)
See dictated note.

## 2010-12-15 NOTE — Telephone Encounter (Signed)
Medication changed to Prilosec from Nexium due ti insurance reasons  Called in to pharmacy to change it.    Esau Grew, MD  Fellow, Section of Digestive Disease  [place] Department of Medicine

## 2010-12-16 MED ORDER — HALOBETASOL PROPIONATE 0.05 % TOPICAL CREAM
TOPICAL_CREAM | CUTANEOUS | Status: DC
Start: 2010-12-16 — End: 2011-02-09

## 2010-12-16 NOTE — Progress Notes (Signed)
[facility]   DEPARTMENT OF [place], [place] [address]     PATIENT NAME: Barbara, Gardner [facility] [id number]  DATE OF SERVICE:12/15/2010  DATE OF BIRTH: [birth year]    PROGRESS NOTE    SUBJECTIVE: [name] is back to the orthopaedic hand clinic for followup status post carpal tunnel release on the right hand.     OBJECTIVE: The wound is well healed without any signs of infection and hematoma. We took the sutures out and we started scar massage. All of her preoperative symptoms are completely gone and she is quite happy with the result.    ASSESSMENT AND PLAN: We will make an appointment for her to see [place] back in 2 months, while we asked her to call and cancel the appointment if she has completely remained asymptomatic without any questions to [place] at that point.      Armandina Gemma, MD  Professor  [facility] Department of Orthopaedics    [id number]; D: 12/15/2010 16:41:17; T: 12/16/2010 29:51:88

## 2010-12-16 NOTE — Progress Notes (Signed)
SUBJECTIVE:  [name] is a 55 y.o. 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  Current outpatient prescriptions   Medication Sig   . hydrocodone-acetaminophen (LORTAB 7.5) 7.5-500 mg Oral Tablet take 1 Tab by mouth Every 4 hours as needed.   . duloxetine (CYMBALTA) 60 mg Oral Capsule, Delayed Release(E.C.) take 1 Cap by mouth Once a day.   . loratadine (CLARITIN) 10 mg Oral Tablet take 1 Tab by mouth Once a day.   . esomeprazole magnesium (NEXIUM) 40 mg CpDR take 1 Cap by mouth Twice a day before meals.   . Diclofenac Sodium (VOLTAREN) 75 mg TbEC take 1 Tab by mouth Twice daily.        ROS: Pertinent items are noted in HPI.  OBJECTIVE:   Vitals: BP 134/78  Pulse 84  Temp 36.9 C (98.5 F)  Ht 1.651 m (5\' 5" )  Wt 91.627 kg (202 lb)  BMI 33.61 kg/m2   Appearance:in no apparent distress  Exam: vulva with some hypopigmentation but improved.Slight agglutination of labial folds persists    ASSESSMENT:   PLAN:  1. 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  Advised to call back directly if there are further questions, or if these symptoms fail to improve as anticipated or worsen.  Theola Sequin, MD  FAMILY MEDICINE-[facility]  Operated by [facility]  [address]  [place] [address]  Dept: [phone]

## 2010-12-31 ENCOUNTER — Ambulatory Visit (INDEPENDENT_AMBULATORY_CARE_PROVIDER_SITE_OTHER): Payer: MEDICAID | Admitting: Psychiatry

## 2010-12-31 MED ORDER — DULOXETINE 60 MG CAPSULE,DELAYED RELEASE
60.0000 mg | DELAYED_RELEASE_CAPSULE | Freq: Every day | ORAL | Status: DC
Start: 2010-12-31 — End: 2011-05-03

## 2010-12-31 NOTE — Progress Notes (Signed)
[name]  [id number]  09-Oct-1956  12/31/2010    Chief Complaint: depression    Subjective:  Patient reports that her insurance stopped paying for her Deplin. She reports that her motivation is decreased since that time. She denies depressed mood, suicide or homicide ideation. She is proud that she made the dean's list in school. She also reports that she has a new grandson in [place] whom she has yet to meet.      Objective:    Mental Status Examination  Appearance: Casually dressed, well groomed, good hygiene  Behavior: Pleasant and cooperative  Level of consciousness: Alert  Orientation: Grossly normal  Motor: No movement abnormalities  Speech: Regular rate, rhythm, and volume  Attention: Adequate for conversation, no excessive distractibility  Eye contact: Good  Memory: No impairment of immediate, delayed, or distant recall  Mood: euthymic  Affect: constricted, but congruent with mood  Thought process: Linear and goal-directed  Thought content: Appropriate, no paranoia, no delusions  Perception: No audiovisual hallucinations  Suicide ideation: None  Homicide ideation: None        Medications:  Current outpatient prescriptions   Medication Sig   . Halobetasol Propionate (ULTRAVATE) 0.05 % Apply externally Cream Apply  topically. Three times a week   . hydrocodone-acetaminophen (LORTAB 7.5) 7.5-500 mg Oral Tablet take 1 Tab by mouth Every 4 hours as needed.   . duloxetine (CYMBALTA) 60 mg Oral Capsule, Delayed Release(E.C.) take 1 Cap by mouth Once a day.   . loratadine (CLARITIN) 10 mg Oral Tablet take 1 Tab by mouth Once a day.   . esomeprazole magnesium (NEXIUM) 40 mg CpDR take 1 Cap by mouth Twice a day before meals.   . Diclofenac Sodium (VOLTAREN) 75 mg TbEC take 1 Tab by mouth Twice daily.            Assessment:  Axis I: Major Depressive Disorder  Axis II: None  Axis III: rectocele repair, bilateral knee arthritis, neck mass, GERD, pending r/o H. Pylori, history of multinodular goiter, migraines   Axis IV: medical illness  Axis V: GAF: 65    Plan:    Continue Cymbalta 60 mg daily. May consider alternative augmenting agent in the future as patient has eliminated lithium very rapidly in the past and her insurance will not cover Deplin. RTC in 3 months.  Patient advised to call with any questions or concerns.    Farris Has. Alois Colgan, MD 12/31/2010, 2:04 PM

## 2011-01-01 ENCOUNTER — Ambulatory Visit: Payer: MEDICAID | Attending: DERMATOLOGY | Admitting: Dermatology

## 2011-01-01 ENCOUNTER — Encounter (INDEPENDENT_AMBULATORY_CARE_PROVIDER_SITE_OTHER): Payer: Self-pay | Admitting: Dermatology

## 2011-01-01 VITALS — BP 118/72 | Wt 196.1 lb

## 2011-01-01 DIAGNOSIS — L659 Nonscarring hair loss, unspecified: Secondary | ICD-10-CM | POA: Insufficient documentation

## 2011-01-01 NOTE — Progress Notes (Addendum)
Subjective:       Patient ID: [name] is a 55 y.o. female     Chief Complaint:   Chief Complaint   Patient presents with   . Hair Loss          HPI   55 yo female presenting for follow-up of hair loss.  States she has started to get hair regrowth without treatment since her last appointment.  States hair that is regrowing is coming in more gray than her hair was previously.  She states that cutting her hair short has given some improvement.  She denies any other skin related complaints today.     Review of Systems   Constitutional: Negative for fever and chills.   Skin: Negative for itching and rash.      Current outpatient prescriptions   Medication Sig   . Omeprazole (PRILOSEC) 40 mg Oral Capsule, Delayed Release(E.C.) take 40 mg by mouth Twice daily.   Marland Kitchen duloxetine (CYMBALTA) 60 mg Oral Capsule, Delayed Release(E.C.) take 1 Cap by mouth Once a day.   Marland Kitchen Halobetasol Propionate (ULTRAVATE) 0.05 % Apply externally Cream Apply  topically. Three times a week   . loratadine (CLARITIN) 10 mg Oral Tablet take 1 Tab by mouth Once a day.   . Diclofenac Sodium (VOLTAREN) 75 mg TbEC take 1 Tab by mouth Twice daily.    Marland Kitchen DISCONTD: esomeprazole magnesium (NEXIUM) 40 mg CpDR take 1 Cap by mouth Twice a day before meals.       Objective:   .    Filed Vitals:    01/01/11 1241   BP: 118/72   Weight: 88.95 kg (196 lb 1.6 oz)         Physical Exam   Constitutional: She appears well-developed and well-nourished. No distress.   HENT:   Head:         Neurological: She is alert.   Skin: No rash noted. She is not diaphoretic. There is erythema. No pallor.     Assessment & Plan:     1. 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 I saw and evaluated the patient and agree with the resident's findings and plans as written except as noted .    Miles Costain, MD 01/01/2011, 2:20 PM

## 2011-01-05 NOTE — Progress Notes (Signed)
I saw and evaluated the patient. I reviewed the resident's note. I agree with the findings and plan of care as documented in the resident's note. Any exceptions/additions are noted.      Shaaron Adler, MD 01/05/2011, 2:23 PM

## 2011-01-20 ENCOUNTER — Ambulatory Visit: Payer: MEDICAID | Attending: Otolaryngology | Admitting: Otolaryngology

## 2011-01-20 VITALS — BP 117/76 | HR 86 | Temp 97.9°F | Ht 66.0 in | Wt 199.0 lb

## 2011-01-20 DIAGNOSIS — G4733 Obstructive sleep apnea (adult) (pediatric): Secondary | ICD-10-CM | POA: Insufficient documentation

## 2011-01-20 MED ORDER — FLUTICASONE PROPIONATE 50 MCG/ACTUATION NASAL SPRAY,SUSPENSION
2.0000 | Freq: Every day | NASAL | Status: DC
Start: 2011-01-20 — End: 2015-12-10

## 2011-01-20 MED ORDER — RANITIDINE 150 MG TABLET
150.00 mg | ORAL_TABLET | Freq: Every evening | ORAL | Status: DC
Start: 2011-01-20 — End: 2011-08-07

## 2011-01-21 NOTE — Procedures (Signed)
OTOLARYGOLOGY PROCEDURE NOTE      DATE OF SERVICE: 01/21/2011  PATIENT NAME:[name]  DATE OF BIRTH: December 18, 1955      Procedure: Flexible Laryngoscopy, Diagnostic   Operator: Edison Nasuti, MD    Procedure:  Bilateral nasal passage was anesthetized with Afrin/Lidocaine.  A flexible laryngoscope was passed.  The findings were as follows:     Nasopharynx: Normal mucosa and no lesions of the nasopharyngeal walls.  The Eustachian tube orifices were normal.     Hypopharynx:  No lesions of the epiglottis, posterior pharyngeal walls or piriform mucosa.  There is no pooling of secretions. Large lingual tonsils and base of tongue.     Larynx:  The vocal cords are mobile and adduct to midline bilaterally.  There are no lesions.   There is good abduction with sniff.  The airway is patent.  .      Patient tolerated procedure well.      Mirian Mo, MD 01/21/2011, 1:00 PM

## 2011-01-21 NOTE — Progress Notes (Signed)
PATIENT NAME:  [name]  MRN:  [id number]  DOB:  [birth year]  DATE OF SERVICE: 01/20/2011    HPI:  [name] is a 55 y.o. year old female who presents for follow-up for allergic rhinitis and laryngopharyngeal reflux. Patient reports and allergy symptoms are not well controlled. She continues to have sneezing, coughing or nasal congestion. She is on Claritin. She was previously on Astelin however she did not tolerate this medication. She has been on omeprazole 20 mg b.i.d. That appears to be controlling her reflux. The patient reports that she has been having gasping spell at night. She reports that she snores at night. She also reports that these gasping swells that occurred during the daytime. She has no other complaints at this time.     Physical Exam:  Blood pressure 117/76, pulse 86, temperature 36.6 C (97.9 F), height 1.676 m (5\' 6" ), weight 90.266 kg (199 lb).  Body mass index is 32.12 kg/(m^2).  General Appearance: Pleasant, cooperative, healthy, and in no acute distress.  Eyes: Conjunctivae/corneas clear, PERRLA, EOM's intact.  Head and Face: Normocephalic, atraumatic.  Face symmetric, no obvious lesions.   Pinnae: Normal shape and position.   External auditory canals:  Patent without inflammation.  Tympanic membranes:  Intact, translucent, midposition, middle ear aerated.  Nose:  External pyramid midline. Septum midline. Mucosa normal. No purulence, polyps, or crusts.   Oral Cavity/Oropharynx: No mucosal lesions, masses, or pharyngeal asymmetry. Mallampati class III   Tonsils: not present  Neck:  No palpable thyroid, salivary gland, or neck masses.  Heme/Lymph:  No cervical adenopathy.  Cardiovascular:  Good perfusion of upper extremities.  No cyanosis of the hands or fingers.  Lungs: No apparent stridorous breathing. No acute distress.  Skin: Skin warm and dry.  Neurologic: Cranial nerves:  grossly intact.  Psychiatric:  Alert and oriented x 3.    Procedure:   OTOLARYGOLOGY PROCEDURE NOTE      DATE OF SERVICE: 01/21/2011  PATIENT NAME:[name]  DATE OF BIRTH: 12/16/1955      Procedure: Flexible Laryngoscopy, Diagnostic   Operator: Edison Nasuti, MD    Procedure:  Bilateral nasal passage was anesthetized with Afrin/Lidocaine.  A flexible laryngoscope was passed.  The findings were as follows:     Nasopharynx: Normal mucosa and no lesions of the nasopharyngeal walls.  The Eustachian tube orifices were normal.     Hypopharynx:  No lesions of the epiglottis, posterior pharyngeal walls or piriform mucosa.  There is no pooling of secretions. Large lingual tonsils and base of tongue.     Larynx:  The vocal cords are mobile and adduct to midline bilaterally.  There are no lesions.   There is good abduction with sniff.  The airway is patent.  .      Patient tolerated procedure well.      Mirian Mo, MD 01/21/2011, 1:00 PM           Assessment:  1. OSA (obstructive sleep apnea) (327.23H)  POLYSOMNOGRAPHY - OVERNIGHT (ANP) 1ST NIGHT OF STUDY (F/U WITH CPAP IF ANP MEETS CLINICAL CRITERIA), FLEX LARYNGOSCOPY DIAGNOSTIC (AMB ONLY)         Plan:  Schedule for PSG  Start zantac   Start Flonase  RTC in after PSG results  Orders Placed This Encounter   . FLEX LARYNGOSCOPY DIAGNOSTIC (AMB ONLY)   . POLYSOMNOGRAPHY - OVERNIGHT (ANP) 1ST NIGHT OF STUDY (F/U WITH CPAP IF ANP MEETS CLINICAL CRITERIA)   . Ranitidine HCl (ZANTAC) 150  mg Oral Tablet   . fluticasone (FLONASE) 50 mcg/actuation Nasal Spray, Suspension     Mirian Mo, MD 01/21/2011, 1:02 PM     Charmaine Downs, MD

## 2011-02-04 NOTE — Progress Notes (Signed)
I saw and evaluated the patient. I reviewed the resident's note. I agree with the findings and plan of care as documented in the resident's note. Any exceptions/additions are noted.      Charmaine Downs, MD 02/04/2011, 11:22 AM

## 2011-02-09 ENCOUNTER — Ambulatory Visit: Payer: MEDICAID | Attending: Multi-Specialty | Admitting: Family Practice-BA

## 2011-02-09 VITALS — BP 118/80 | HR 80 | Temp 96.8°F | Wt 199.7 lb

## 2011-02-09 DIAGNOSIS — L0292 Furuncle, unspecified: Secondary | ICD-10-CM | POA: Insufficient documentation

## 2011-02-09 DIAGNOSIS — L940 Localized scleroderma [morphea]: Secondary | ICD-10-CM | POA: Insufficient documentation

## 2011-02-09 MED ORDER — HALOBETASOL PROPIONATE 0.05 % TOPICAL CREAM
TOPICAL_CREAM | CUTANEOUS | Status: DC
Start: 2011-02-09 — End: 2012-07-24

## 2011-02-09 NOTE — Progress Notes (Signed)
I discussed the patient's care with the Resident prior to the patient leaving the clinic. Any significant discussion points are noted.    Shaune Pascal, MD 02/09/2011, 3:22 PM

## 2011-02-09 NOTE — Progress Notes (Signed)
SUBJECTIVE:    [name] is a 55 y.o. female    PCP:  Theola Sequin, MD    Chief Complaint   Patient presents with   . Breast Lump     under arm         HPI:    Patient presents to clinic with a  Small lump under her right arm. She noticed this 2 hours ago. 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 She has been shaving her hair axillary hair. She denies any fevers, nausea. She has not tried any medication or any local treatment such as warm compresses.   She has no history of MRSA. She has no history of diabetes or immunodeficiency.  She is also asking for a refill of her steroid cream for her lichen sclerosus.     ROS:    -  Negative  + Positive     Constitutional: (-) fever, (-) weight loss, (-) anorexia, (-) fatigue  Respiratory: (-) for cough,  (-) shortness of breath,   GI:  (-) nausea, (-)vomiting    OBJECTIVE:  Filed Vitals:    02/09/11 1358   BP: 118/80   Pulse: 80   Temp: 36 C (96.8 F)   Weight: 90.6 kg (199 lb 11.8 oz)       General: alert, cooperative, no distress, appears stated age  Lungs: no use of accessory muscles for breathing. clear to auscultation bilaterally.  CVS: NRRR. Normal S1, S2  Extremities:   Her right axilla has a 0.5 cm inflamed hair follicles, this is nontender, no fluctuance. no drainage.    ASSESSMENT/PLAN:     1. Inflamed hair follicle (680.9S)  Advised warm compresses. May take OTC NSAIDs as needed for pain. No need for antibiotics at this time.    2. Lichen sclerosus et atrophicus of the vulva (701.0J)  Stable. Halobetasol Propionate (ULTRAVATE) 0.05 % Apply externally Cream     RTC if there is increased inflammation, tenderness suggesting an infection/ prn   Jasper Loser, MD 02/09/2011, 2:54 PM

## 2011-02-16 ENCOUNTER — Encounter (INDEPENDENT_AMBULATORY_CARE_PROVIDER_SITE_OTHER): Payer: MEDICAID | Admitting: SURGERY OF THE HAND

## 2011-03-03 ENCOUNTER — Other Ambulatory Visit (HOSPITAL_BASED_OUTPATIENT_CLINIC_OR_DEPARTMENT_OTHER): Payer: Self-pay

## 2011-03-11 ENCOUNTER — Ambulatory Visit
Admission: RE | Admit: 2011-03-11 | Discharge: 2011-03-11 | Disposition: A | Discharge status: Home / Self Care | Payer: MEDICAID | Source: Ambulatory Visit | Attending: Family Medicine | Admitting: Family Medicine

## 2011-03-11 ENCOUNTER — Encounter (INDEPENDENT_AMBULATORY_CARE_PROVIDER_SITE_OTHER): Payer: Self-pay | Admitting: Family Medicine

## 2011-03-11 ENCOUNTER — Ambulatory Visit (HOSPITAL_BASED_OUTPATIENT_CLINIC_OR_DEPARTMENT_OTHER): Payer: MEDICAID | Admitting: Family Medicine

## 2011-03-11 VITALS — BP 128/80 | HR 80 | Temp 97.6°F | Ht 66.0 in | Wt 197.0 lb

## 2011-03-11 DIAGNOSIS — Z5181 Encounter for therapeutic drug level monitoring: Secondary | ICD-10-CM | POA: Insufficient documentation

## 2011-03-11 DIAGNOSIS — R1013 Epigastric pain: Secondary | ICD-10-CM

## 2011-03-11 DIAGNOSIS — N904 Leukoplakia of vulva: Secondary | ICD-10-CM

## 2011-03-11 DIAGNOSIS — K219 Gastro-esophageal reflux disease without esophagitis: Secondary | ICD-10-CM

## 2011-03-11 DIAGNOSIS — L940 Localized scleroderma [morphea]: Secondary | ICD-10-CM | POA: Insufficient documentation

## 2011-03-11 DIAGNOSIS — K3189 Other diseases of stomach and duodenum: Secondary | ICD-10-CM | POA: Insufficient documentation

## 2011-03-11 LAB — CBC/DIFF
BASOPHILS: 1 % (ref 0–1)
BASOS ABS: 0.04 10*3/uL (ref 0.0–0.2)
EOS ABS: 0.136 THOU/uL (ref 0.1–0.3)
EOSINOPHIL: 2 % (ref 1–6)
HCT: 43.2 % (ref 33.5–45.2)
HGB: 14.7 g/dL (ref 11.5–15.2)
LYMPHOCYTES: 39 % (ref 20–45)
LYMPHS ABS: 2.57 10*3/uL (ref 1.0–4.8)
MCH: 28.9 pg (ref 27.4–33.0)
MCHC: 34 g/dL (ref 31.6–35.5)
MCV: 84.9 fL (ref 82.0–99.0)
MONOCYTES: 6 % (ref 4–13)
MONOS ABS: 0.409 10*3/uL (ref 0.1–0.9)
MPV: 6.6 FL — ABNORMAL LOW (ref 7.4–10.4)
NRBC'S: 0 /100WBC
PLATELET COUNT: 433 10*3/uL (ref 140–450)
PMN ABS: 3.51 10*3/uL (ref 1.5–7.7)
PMN'S: 52 % (ref 40–75)
RBC: 5.09 MIL/uL — ABNORMAL HIGH (ref 3.84–5.04)
RDW: 13.4 % (ref 10.2–14.0)
WBC: 6.7 10*3/uL (ref 3.5–11.0)

## 2011-03-11 LAB — ALT (SGPT): ALT (SGPT): 28 U/L (ref 7–45)

## 2011-03-11 LAB — AMYLASE: AMYLASE: 52 U/L (ref <101)

## 2011-03-11 LAB — LIPASE: LIPASE: 40 U/L (ref 6–51)

## 2011-03-11 LAB — BILIRUBIN TOTAL: BILIRUBIN, TOTAL: 0.7 mg/dL (ref 0.3–1.3)

## 2011-03-11 LAB — ALK PHOS (ALKALINE PHOSPHATASE): ALKALINE PHOSPHATASE: 71 U/L (ref 38–126)

## 2011-03-11 LAB — AST (SGOT): AST (SGOT): 23 U/L (ref 8–41)

## 2011-03-11 MED ORDER — ESOMEPRAZOLE MAGNESIUM 40 MG CAPSULE,DELAYED RELEASE
40.00 mg | DELAYED_RELEASE_CAPSULE | Freq: Two times a day (BID) | ORAL | Status: DC
Start: 2011-03-11 — End: 2011-03-26

## 2011-03-11 NOTE — Progress Notes (Signed)
SUBJECTIVE:  [name] is a 55 y.o. female is here today for Skin Check    She has GERD.  Current treatments proton pump inhibitor: Prilosec 40 mg bid which are generally ineffective, made things worse.  Her reflux symptoms are well controlled, poorly controlled. Current symptoms include dyspepsia, reflux symptoms and diarrhea  The patient denies vomiting, melena and abdominal pain  The patient is taking GI-irritating medications. Diclofenac  Did well on Nexium bid    Her mother died about 2 wks ago, very stressed and upset lately    Lichen sclerosis --skin gets irritated if stops using Halog for a week or two and then uses Halog just one time with good relief for a few more weeks          I have reviewed the patient's medical, surgical, family and social history in detail today, 03/11/2011  and updated the computerized patient record as appropriate.  Current outpatient prescriptions   Medication Sig   . esomeprazole magnesium (NEXIUM) 40 mg Oral Capsule, Delayed Release(E.C.) take 1 Cap by mouth Twice a day before meals. Symptoms uncontrolled with Prilosec   . Ranitidine HCl (ZANTAC) 150 mg Oral Tablet take 1 Tab by mouth every night.   . fluticasone (FLONASE) 50 mcg/actuation Nasal Spray, Suspension 2 Sprays by Each Nostril route Once a day.   . Omeprazole (PRILOSEC) 40 mg Oral Capsule, Delayed Release(E.C.) take 40 mg by mouth Twice daily.   Marland Kitchen duloxetine (CYMBALTA) 60 mg Oral Capsule, Delayed Release(E.C.) take 1 Cap by mouth Once a day.   . loratadine (CLARITIN) 10 mg Oral Tablet take 1 Tab by mouth Once a day.   . Diclofenac Sodium (VOLTAREN) 75 mg TbEC take 1 Tab by mouth Twice daily.    Marland Kitchen Halobetasol Propionate (ULTRAVATE) 0.05 % Apply externally Cream Apply  topically. Three times a week       ROS: Pertinent items are noted in HPI.  OBJECTIVE:   Vitals: BP 128/80  Pulse 80  Temp 36.4 C (97.6 F)  Ht 1.676 m (5\' 6" )  Wt 89.359 kg (197 lb)  BMI 31.80 kg/m2   Appearance:in no apparent  distress  Exam: Vulva looks normal   Abdominal exam: soft, non-tender. Bowel sounds normal. No masses,  no organomegaly, no tenderness at all      ASSESSMENT:   PLAN:  1. GERD (gastroesophageal reflux disease) (530.81S)  esomeprazole magnesium (NEXIUM) 40 mg Oral Capsule, Delayed Release(E.C.) one bid instead of omeprazole  She will call her pharmacy and have them notify us if we need to do prior auth    uncontrolled    2. Lichen sclerosus et atrophicus of the vulva (701.0J)  well controlled  Use Halog infrequently as she has been doing     3. Therapeutic drug monitoring diclofenac (V58.83E)  AST (SGOT), ALT (SGPT), CBC/DIFF  Medications Discontinued During This Encounter   Medication Reason   . Diclofenac Sodium (VOLTAREN) 75 mg TbEC Side effects          4. Dyspepsia (536.8Z)  AST (SGOT), ALT (SGPT), BILIRUBIN TOTAL, ALK PHOS (ALKALINE PHOSPHATASE), AMYLASE, LIPASE, CBC/DIFF     Return in about 4 days (around 03/15/2011).  Renewed her Mychart code since she wants to get on    Advised to call back directly if there are further questions, or if these symptoms fail to improve as anticipated or worsen.  Theola Sequin, MD  [facility]  [facility]  [address]  [place]  [address]  [phone]

## 2011-03-15 ENCOUNTER — Telehealth (INDEPENDENT_AMBULATORY_CARE_PROVIDER_SITE_OTHER): Payer: Self-pay | Admitting: Family Medicine

## 2011-03-15 NOTE — Telephone Encounter (Signed)
Prior auth needed for Nexium DR 40 mg capsule  [facility]- [phone]   -f- [phone]  ID# [id number]  [phone]  NOt covered-   Use Omeprazole, Protonix or Dexilant.  May be able to get approved if MD calls Medicaid. [phone]  Form to Dr. Vita Erm box.

## 2011-03-18 NOTE — Telephone Encounter (Signed)
Having severe breakthrough symptoms with Omeprazole high dose 40 mg bid but did very well on Nexium  Please call and request authorization  She has been very uncomfortable since she was forced to stop Nexium  Theola Sequin, MD

## 2011-03-19 NOTE — Telephone Encounter (Signed)
Preferred meds are:   Dexilant, Omeprazole and Pantoprazole.  A 60 day trial of each of the preferred agents, inclusive of a 30 day trial at the maximum dose of an H2 antagonist are required before a non-preferred agent will be approved.  A prior authorization is not required for Prevacid Solu-tab for patients under 54 years of age.      I could not find that the pt had tried Dexilant or Pantoprazole.      Form for prior auth to Dr.

## 2011-03-23 ENCOUNTER — Encounter (INDEPENDENT_AMBULATORY_CARE_PROVIDER_SITE_OTHER): Payer: MEDICAID | Admitting: Otolaryngology

## 2011-03-26 ENCOUNTER — Other Ambulatory Visit (INDEPENDENT_AMBULATORY_CARE_PROVIDER_SITE_OTHER): Payer: Self-pay | Admitting: Family Medicine

## 2011-03-26 DIAGNOSIS — K219 Gastro-esophageal reflux disease without esophagitis: Secondary | ICD-10-CM

## 2011-03-26 MED ORDER — FAMOTIDINE 40 MG TABLET
40.00 mg | ORAL_TABLET | Freq: Two times a day (BID) | ORAL | Status: DC
Start: 2011-03-26 — End: 2011-08-07

## 2011-03-26 MED ORDER — PANTOPRAZOLE 40 MG TABLET,DELAYED RELEASE
40.0000 mg | DELAYED_RELEASE_TABLET | Freq: Every day | ORAL | Status: DC
Start: 2011-03-26 — End: 2011-06-16

## 2011-03-26 NOTE — Progress Notes (Signed)
Appeal for prior auth denied, required trial of alternate ppi plus H2 blocker  Prescription(s) sent electronically to this pharmacy:   [facility] [address], [place] [address] [phone] Fax: [phone]    Message left on home machine about the change.   Call prn  Theola Sequin, MD

## 2011-03-28 NOTE — Procedures (Signed)
[facility]                                         Sleep Laboratory                                           [address]                                    [place], [place] [address]                                          [phone]                                       PATIENT NAME:    [name], [name]                   HOSPITAL NUMBER: [id number]  DATE OF BIRTH:   08-01-1956    DATE OF SERVICE: 11/17/2007      Blenda Nicely, MD  [address]   [place], [place] [address]     Dear Dr. Ardean Larsen:      Thank you very much for referring this patient to [facility].  As you know, [name] is a 55 year old, 66-inch, 195-pound female who has complaints of excessive daytime somnolence and snoring.  She says she usually is in bed by 11:00 p.m. and watches TV and falls asleep.  She wakes up at least 4 times a night and then turns back over and tries to get some sleep.  Some nights she gets up for about an hour or more and she cannot return to sleep.  This has been going on for longer than 2 years.  It has been bothering her a lot.  She is having considerable sleep problems with her legs jerking, difficulty falling asleep, excessive daytime sleepiness, sour taste or dry mouth.  Her usual bedtime is 10:00 p.m. to 11:00 p.m.  She usually gets out of bed at 7:30 a.m.  She does watch TV in bed before she falls asleep.  She drinks 3-5 cups of coffee a day and 1-2 cups of cola.  She is currently on Nexium 40 mg daily, Effexor XR 225 mg daily and Advil as needed.  Her past medical history is significant for GERD, depression and pain.  Her routine depression questionnaire endorses mild depressive symptoms.  On an Epworth Sleepiness Scale, she endorses a 12, which indicates mild to moderate amount of excessive daytime somnolence.       The polysomnography consisted of the following recorded channels:  Central and occipital EEG, chin EMG, ROC and LOC, right and left anterior tibial, EKG, snoring mic, nasal/oral air flow, thoracic effort, abdominal effort, SaO2 and body position.      [name] was provided with a 406-minute sleep opportunity of which she only slept 291 minutes.  Her sleep efficiency was 71.6%.  Her sleep latency was 32 minutes.  REM latency  was 320 minutes.  Percentage of total time she spent in REM was 1.1% of her total sleep.  Her sleep architecture showed that she spent 0.1% of her total sleep in slow-wave sleep.  She spent 82.5 minutes in the supine position, 208.5 minutes on the side position.      In regards to respiratory-related sleep disturbances, the patient had 10 respiratory events, all hypopneas.  Her apnea-hypopnea index for the evening was 2.06 counts per hour of sleep.  She did not have any hypopneas during REM sleep.  Minimum desaturation during one of these events was 90%.  Maximum duration of one of these events was 20 seconds.      In regards to movement-related sleep disturbances, the patient had no periodic limb movements of sleep.  She did have 100 arousals, 74 spontaneous, 11 secondary to snore, 13 secondary to leg movements and 8 secondary to hypopneas.      Her EKG was within normal limits.  Her EEG showed mild alpha intrusion.  She had no REM behavior disturbances.       IN SUMMARY, [name] all-night polysomnographic evaluation fails to reveal any respiratory related or movement related sleep disturbance.  She does not have much in the snore channels to account for upper airway resistance syndrome and her REM is fairly fragmented.  At this time, we are going to go ahead and have the patient return for a MSLT to see if the possibility of narcolepsy or idiopathic primary hypersomnolence is a consideration.  We will go ahead and schedule this.  She will return for all-night polysomnographic evaluation and an MSLT following.  Thank you very much for referring this patient to [facility] and we will keep you informed of our findings.        Benay Spice, MD  Neuropsychiatry  [place] [facility] of Behavioral Medicine and Psychiatry    I reviewed this sleep study with Dr. Ammie Ferrier.  I agree with interpretation of the findings and recommendations in the note above.    Terald Sleeper, MD  Director, Sleep Laboratory  [facility] Department of Neurology and Psychiatry    [phone]; D: 11/20/2007 13:46:59; T: 11/21/2007 07:32:10    cc: Charmaine Downs MD      Shirleen Schirmer

## 2011-03-28 NOTE — Letter (Signed)
[facility] Department of Orthopaedics  [address]  [place], [place] [address]      [facility] CL[facility]IC    PATIENT NAME: [name], [name]  CHART NUMBER: [id number]  DATE OF BIRTH: April 02, 1956  DATE OF SERVICE: 01/16/2010    January 16, 2010    Dear Dr. Luan Pulling:    I saw your patient, [name], for evaluation of her knee pain.  As you recall, this is a pleasant 55 year old who is having some ongoing recurrent knee problems.  She has had recurrence of left anterior knee pain and patellofemoral arthrosis for which she has previously seen Dr. Townsend Roger in the past.  She has had a brace, which seems to help with this, and has been to physical therapy, which has helped in the past, but the knee pain has never completely resolved.  More recently, however, in February she was on a long flight and basically had to stay in her seat for three hours.  Following this flight she had severe pain, not only in the left knee but also in the right knee.  She denies ever having any pain in the right knee in the past.  She has never had any x-rays of the right knee before.  She is currently using Voltaren, which is only providing very minimal relief for either knee.  She actually states she would like to do physical therapy, but at this point feels that her knee is too painful to even do physical therapy.  She is wearing the brace on the left knee and finds some partial relief with this.  She denies any catching or locking of the knee.  She has had significant swelling since the onset of pain in February.    The past medical history, family history, social history and medications are reviewed in Epic.    Review of systems is positive for knee pain as above, and depression.  All other systems are negative.     On examination, she is in no acute distress.  She has a severely antalgic gait.  Right knee:  There is a mild effusion.  She has full range of motion but pain with terminal extension and flexion.  She is exquisitely tender over the patella.  She is also exquisitely tender over the pes anserine tendons and bursa.  She has pain with resisted flexion of the knee.  McMurray maneuver is equivocal producing diffuse pain.  Left knee:  There is a mild effusion.  She has full range of motion.  She is exquisitely tender over the medial border of the patella.  There is a positive patellar grind.  There is minimal tenderness noted over the pes anserine tendons.  She really has no significant pain with resisted flexion of the knee.    Previous films of the left knee are reviewed and new films of the right knee are obtained today, which demonstrate minimal degenerative change and no acute bony abnormalities.    Impression:  Bilateral anterior knee pain, patellofemoral arthrosis and right greater than left pes anserine bursitis.    Plan:  After discussion of risks and benefits, we proceeded with injections.  We performed an injection of the pes anserine bursa on the right knee and an intraarticular injection on the left knee.  She will go ahead and begin physical therapy for both knees.  She will continue with the Voltaren as an anti-inflammatory.  We will see her back in four weeks to see how she has done  with the injections and therapy.    Thank you for the opportunity to participate in the care of this patient.  If you have questions or concerns, please feel free to contact me.    Sincerely,      [name], MD  Assistant Professor  [place] [facility] of Orthopaedics    [id number]; D: 01/16/2010 16:07:04; T: 01/16/2010 17:50:59    cc: Theola Sequin MD      Shirleen Schirmer

## 2011-03-29 ENCOUNTER — Encounter (INDEPENDENT_AMBULATORY_CARE_PROVIDER_SITE_OTHER): Payer: Self-pay | Admitting: Family Medicine

## 2011-03-29 NOTE — Progress Notes (Addendum)
Prior auth denied- Dr. Luan Pulling changed the med to Pantoprozole and Pepcid.     Theola Sequin, MD 04/12/2011, 8:25 PM

## 2011-04-01 ENCOUNTER — Encounter (HOSPITAL_COMMUNITY): Payer: MEDICAID | Admitting: Psychiatry

## 2011-04-05 ENCOUNTER — Ambulatory Visit: Payer: MEDICAID | Attending: Multi-Specialty | Admitting: NURSE PRACTITIONER

## 2011-04-05 ENCOUNTER — Encounter (INDEPENDENT_AMBULATORY_CARE_PROVIDER_SITE_OTHER): Payer: Self-pay | Admitting: NURSE PRACTITIONER

## 2011-04-05 DIAGNOSIS — I889 Nonspecific lymphadenitis, unspecified: Secondary | ICD-10-CM | POA: Insufficient documentation

## 2011-04-05 DIAGNOSIS — B372 Candidiasis of skin and nail: Secondary | ICD-10-CM | POA: Insufficient documentation

## 2011-04-05 MED ORDER — NYSTATIN-TRIAMCINOLONE 100,000 UNIT/G-0.1 % TOPICAL CREAM
TOPICAL_CREAM | Freq: Four times a day (QID) | CUTANEOUS | Status: DC
Start: 2011-04-05 — End: 2012-07-24

## 2011-04-05 MED ORDER — CEPHALEXIN 500 MG CAPSULE
500.00 mg | ORAL_CAPSULE | Freq: Two times a day (BID) | ORAL | Status: DC
Start: 2011-04-05 — End: 2011-06-16

## 2011-04-05 MED ORDER — HYDROXYZINE HCL 10 MG TABLET
10.00 mg | ORAL_TABLET | Freq: Three times a day (TID) | ORAL | Status: DC | PRN
Start: 2011-04-05 — End: 2011-06-16

## 2011-04-05 NOTE — Progress Notes (Addendum)
Subjective:     Patient ID:  Guliana Weyandt is an 55 y.o. female   Chief Complaint:    Chief Complaint   Patient presents with   . Skin Rash     on chest x1 week       HPI Comments: Pt here with one week history of red, itchy rash over chest and breasts. Rash started spontaneously without preceding change of diet, lotion, meds, soap, detergent, and poison exposure. The rash started under her breast and has now spread to her chest and upper abd. It is most itchy at night and in the morning, the itch is "driving her crazy." She denies any seeping lesions or discharge and any other skin lesions. She has been sweating more than normal in this heat.  Her second acute complaint today is of sore, tender "bumps" in her right armpit. She had one larger, super tender one which is decreasing in size, but two other tender ones have developed. She has tried warm compresses and "black sav" without resolve. She denies any fever or body aches and any other lumps.       ROS  Objective:   Physical Exam   Skin: Skin is warm. Rash noted.        Red papular/vesicular rash over bilateral breasts, breast folds, and upper torso and abd. Visible satellite lesions. All lesions are dry on an erythematous base. There is visible dermatographism. The tissue under and between the breast is moist with sweat.     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 Both are erythematous, mobile and tender. They have discrete borders and are not confluent. The pores appear large in the axillary space. The left axilla is WNL.     Marland Kitchen    Assessment & Plan:      Candidiasis with extreme itching  Comment:   Plan: nystatin-triamcinolone (MYCOLOG II) 100,000-0.1        unit/g-% Apply externally Cream, hydrOXYzine         HCl (ATARAX) 10 mg Oral Tablet             Adenitis  Comment:   Plan: cephALEXin (KEFLEX) 500 mg Oral Capsule            RTC prn, Thereasa Parkin, CFNP

## 2011-04-12 NOTE — Telephone Encounter (Signed)
Prior auth for Nexium denied- 03-29-11  Dr. Riley Churches to Pantoprozole.

## 2011-05-03 ENCOUNTER — Other Ambulatory Visit (HOSPITAL_COMMUNITY): Payer: Self-pay | Admitting: Psychiatry

## 2011-05-03 MED ORDER — DULOXETINE 60 MG CAPSULE,DELAYED RELEASE
60.0000 mg | DELAYED_RELEASE_CAPSULE | Freq: Every day | ORAL | Status: DC
Start: 2011-05-03 — End: 2011-06-16

## 2011-05-03 NOTE — Telephone Encounter (Signed)
Refill authorized. Will see patient in August.    Farris Has. Hyland Mollenkopf, MD 05/03/2011, 1:56 PM

## 2011-05-03 NOTE — Telephone Encounter (Signed)
Message copied by Dian Queen on Mon May 03, 2011  1:56 PM  ------       Message from: Barkley Bruns       Created: Mon May 03, 2011  1:49 PM         >> Barkley Bruns 05/03/2011 01:49 PM       Patient had missed her June appt with you because her mother had died. She is now on to see you 8/7 but is needing refill of cymbalta 60mg  taken once a day [facility]          [facility] [address], [place] - [address]         [address] [place] [address]         Phone: [phone] Fax: [phone]

## 2011-05-31 ENCOUNTER — Encounter (INDEPENDENT_AMBULATORY_CARE_PROVIDER_SITE_OTHER): Payer: MEDICAID | Admitting: Gastroenterology-BA

## 2011-06-01 ENCOUNTER — Encounter (HOSPITAL_COMMUNITY): Payer: MEDICAID | Admitting: Psychiatry

## 2011-06-16 ENCOUNTER — Ambulatory Visit (INDEPENDENT_AMBULATORY_CARE_PROVIDER_SITE_OTHER): Payer: MEDICAID | Admitting: Psychiatry

## 2011-06-16 MED ORDER — DULOXETINE 60 MG CAPSULE,DELAYED RELEASE
60.0000 mg | DELAYED_RELEASE_CAPSULE | Freq: Every day | ORAL | Status: DC
Start: 2011-06-16 — End: 2012-08-21

## 2011-06-16 NOTE — Progress Notes (Addendum)
[name]  [id number]  [birth year]  06/16/2011    Chief Complaint: depression    Subjective:  Patient reports that she has been feeling more depressed since running out of Cymbalta about 1 week ago. She called in for one refill, but that ran out and she didn't call in again. She also mentions that her sleep is poor without the medication. She would like to restart the Cymbalta as it had been so helpful in the past. She continues to work hard in school and finds it somewhat stressful, but she has been successful. She denies suicidal or homicidal ideation being off of the Cymbalta. She has been having headaches and flu-like symptoms since stopping.    Objective:    Mental Status Examination  Appearance: Casually dressed, well groomed, good hygiene  Behavior: Pleasant and cooperative  Level of consciousness: Alert  Orientation: Grossly normal  Motor: No movement abnormalities  Speech: Regular rate, rhythm, and volume  Attention: Adequate for conversation, no excessive distractibility  Eye contact: Good  Memory: No impairment of immediate, delayed, or distant recall  Mood: "moody"  Affect: congruent with mood  Thought process: Linear and goal-directed  Thought content: Appropriate, no paranoia, no delusions  Perception: No audiovisual hallucinations  Suicide ideation: None  Homicide ideation: None        Medications:  Current Outpatient Prescriptions   Medication Sig   . duloxetine (CYMBALTA) 60 mg Oral Capsule, Delayed Release(E.C.) take 1 Cap by mouth Once a day.   Marland Kitchen DISCONTD: duloxetine (CYMBALTA) 60 mg Oral Capsule, Delayed Release(E.C.) take 1 Cap by mouth Once a day.   . nystatin-triamcinolone (MYCOLOG II) 100,000-0.1 unit/g-% Apply externally Cream Apply  topically Four times a day.   Marland Kitchen DISCONTD: cephALEXin (KEFLEX) 500 mg Oral Capsule take 1 Cap by mouth Twice daily.   Marland Kitchen DISCONTD: hydrOXYzine HCl (ATARAX) 10 mg Oral Tablet take 1 Tab by mouth Three times a day as needed for Itching.    . Famotidine (PEPCID) 40 mg Oral Tablet take 1 Tab by mouth Twice daily.   Marland Kitchen DISCONTD: pantoprazole (PROTONIX) 40 mg Oral Tablet, Delayed Release (E.C.) take 1 Tab by mouth Once a day.   Marland Kitchen Halobetasol Propionate (ULTRAVATE) 0.05 % Apply externally Cream Apply  topically. Three times a week   . Ranitidine HCl (ZANTAC) 150 mg Oral Tablet take 1 Tab by mouth every night.   . fluticasone (FLONASE) 50 mcg/actuation Nasal Spray, Suspension 2 Sprays by Each Nostril route Once a day.   . loratadine (CLARITIN) 10 mg Oral Tablet take 1 Tab by mouth Once a day.           Assessment:  Axis I: Major Depressive Disorder  Axis II: None  Axis III: rectocele repair, bilateral knee arthritis, neck mass, GERD, pending r/o H. Pylori, history of multinodular goiter, migraines  Axis IV: medical illness  Axis V: GAF: 55    Plan:    Resume Cymbalta 60 mg daily. May consider alternative augmenting agent in the future as patient has eliminated lithium very rapidly in the past and her insurance will not cover Deplin. RTC in 1 month.  Patient advised to call with any questions or concerns.    Farris Has. Esmee Fallaw, MD 06/16/2011, 8:57 AM        I saw and examined the patient.  I reviewed the resident's note.  I agree with the findings and plan of care as documented in the resident's note.  Any exceptions/additions are edited/noted.    Renee Pain,  MD 06/16/2011, 10:26 AM

## 2011-07-05 ENCOUNTER — Encounter (INDEPENDENT_AMBULATORY_CARE_PROVIDER_SITE_OTHER): Payer: MEDICAID | Admitting: Dermatology

## 2011-07-08 ENCOUNTER — Ambulatory Visit: Payer: MEDICAID | Attending: Family Medicine | Admitting: Family Medicine

## 2011-07-08 VITALS — BP 112/74 | HR 66 | Temp 98.2°F | Ht 66.0 in | Wt 203.0 lb

## 2011-07-08 DIAGNOSIS — K219 Gastro-esophageal reflux disease without esophagitis: Secondary | ICD-10-CM

## 2011-07-08 DIAGNOSIS — Z1239 Encounter for other screening for malignant neoplasm of breast: Secondary | ICD-10-CM

## 2011-07-08 MED ORDER — OMEPRAZOLE 20 MG CAPSULE,DELAYED RELEASE
20.0000 mg | DELAYED_RELEASE_CAPSULE | Freq: Every day | ORAL | Status: DC
Start: 2011-07-08 — End: 2012-10-04

## 2011-07-12 NOTE — Progress Notes (Signed)
SUBJECTIVE:  [name] is a 55 y.o. 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    Outpatient Prescriptions Prior to Visit:  duloxetine (CYMBALTA) 60 mg Oral Capsule, Delayed Release(E.C.) take 1 Cap by mouth Once a day.   nystatin-triamcinolone (MYCOLOG II) 100,000-0.1 unit/g-% Apply externally Cream Apply  topically Four times a day.   Famotidine (PEPCID) 40 mg Oral Tablet take 1 Tab by mouth Twice daily.   Halobetasol Propionate (ULTRAVATE) 0.05 % Apply externally Cream Apply  topically. Three times a week   fluticasone (FLONASE) 50 mcg/actuation Nasal Spray, Suspension 2 Sprays by Each Nostril route Once a day.   loratadine (CLARITIN) 10 mg Oral Tablet take 1 Tab by mouth Once a day.   Ranitidine HCl (ZANTAC) 150 mg Oral Tablet take 1 Tab by mouth every night.     ROS: Pertinent items are noted in HPI.  OBJECTIVE:   Vitals: BP 112/74  Pulse 66  Temp 36.8 C (98.2 F)  Ht 1.676 m (5\' 6" )  Wt 92.08 kg (203 lb)  BMI 32.76 kg/m2   Appearance:in no apparent distress  Exam: Lungs: clear to auscultation bilaterally, normal percussion bilaterally  Heart: regular rate and rhythm, S1, S2 normal, no murmur, click, rub or gallop  Abdomen: soft, non-tender. Bowel sounds normal. No masses,  no organomegaly  Skin: Skin color, texture, turgor normal. No rashes or lesions    ASSESSMENT:   PLAN:  1. GERD  (gastroesophageal reflux disease) (530.81)     2. Breast screening, unspecified (V76.10)  MAMMO BILATERAL SCREENING     Return in about 4 months (around 11/07/2011).  Advised to call back directly if there are further questions, or if these symptoms fail to improve as anticipated or worsen.  Theola Sequin, MD  [facility] FAMILY MEDICINE [facility]  FAMILY MEDICINE-[facility]  [address]  [place] [address]  [phone]

## 2011-07-20 ENCOUNTER — Ambulatory Visit (INDEPENDENT_AMBULATORY_CARE_PROVIDER_SITE_OTHER): Payer: MEDICAID | Admitting: Psychiatry

## 2011-07-20 NOTE — Progress Notes (Addendum)
[name]  [id number]  1956/07/23  07/20/2011    Chief Complaint: depression    Subjective:  She reports that she is sleeping more lately. She reports that her sister had a heart attack and her physicians think that she is dying. She currently has 5 grandchildren and she raised them when they were younger. She is tolerating her Cymbalta well. She denies side effects. She has been having some stress with math classes she started, but she is studying and switched to a lower level class. 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    Objective:    Mental Status Examination  Appearance: Casually dressed, well groomed, good hygiene  Behavior: Pleasant and cooperative  Level of consciousness: Alert  Orientation: Grossly normal  Motor: No movement abnormalities  Speech: Regular rate, rhythm, and volume  Attention: Adequate for conversation, no excessive distractibility  Eye contact: Good  Memory: No impairment of immediate, delayed, or distant recall  Mood: slightly anxious  Affect: congruent with mood  Thought process: Linear and goal-directed  Thought content: Appropriate, no paranoia, no delusions  Perception: No audiovisual hallucinations  Suicide ideation: None  Homicide ideation: None        Medications:  Current Outpatient Prescriptions   Medication Sig   . omeprazole (PRILOSEC) 20 mg Oral Capsule, Delayed Release(E.C.) take 1 Cap by mouth Once a day. Famotidine treatment failed   . duloxetine (CYMBALTA) 60 mg Oral Capsule, Delayed Release(E.C.) take 1 Cap by mouth Once a day.   . nystatin-triamcinolone (MYCOLOG II) 100,000-0.1 unit/g-% Apply externally Cream Apply  topically Four times a day.   . Famotidine (PEPCID) 40 mg Oral Tablet take 1 Tab by mouth Twice daily.   Marland Kitchen Halobetasol Propionate (ULTRAVATE) 0.05 % Apply externally Cream Apply  topically. Three times a week   . Ranitidine HCl (ZANTAC) 150 mg Oral Tablet take 1 Tab by mouth every night.    . fluticasone (FLONASE) 50 mcg/actuation Nasal Spray, Suspension 2 Sprays by Each Nostril route Once a day.   . loratadine (CLARITIN) 10 mg Oral Tablet take 1 Tab by mouth Once a day.           Assessment:  Axis I: Major Depressive Disorder  Axis II: None  Axis III: rectocele repair, bilateral knee arthritis, neck mass, GERD, pending r/o H. Pylori, history of multinodular goiter, migraines  Axis IV: medical illness  Axis V: GAF: 55    Plan:    Resume Cymbalta 60 mg daily. May consider alternative augmenting agent in the future as patient has eliminated lithium very rapidly in the past and her insurance will not cover Deplin. RTC in 1 month.  Patient advised to call with any questions or concerns.    Farris Has. Channon Ambrosini, MD 07/20/2011, 2:00 PM      I saw and examined the patient.  I reviewed the resident's note.  I agree with the findings and plan of care as documented in the resident's note.  Any exceptions/additions are edited/noted.    Renee Pain, MD 07/21/2011, 9:25 AM

## 2011-07-22 ENCOUNTER — Ambulatory Visit
Admission: RE | Admit: 2011-07-22 | Discharge: 2011-07-22 | Disposition: A | Discharge status: Home / Self Care | Payer: MEDICAID | Source: Ambulatory Visit | Attending: Family Medicine | Admitting: Family Medicine

## 2011-07-22 DIAGNOSIS — Z1231 Encounter for screening mammogram for malignant neoplasm of breast: Secondary | ICD-10-CM

## 2011-09-07 ENCOUNTER — Ambulatory Visit (HOSPITAL_BASED_OUTPATIENT_CLINIC_OR_DEPARTMENT_OTHER): Payer: MEDICAID | Admitting: Family Medicine

## 2011-09-07 ENCOUNTER — Encounter (INDEPENDENT_AMBULATORY_CARE_PROVIDER_SITE_OTHER): Payer: Self-pay | Admitting: Family Medicine

## 2011-09-07 ENCOUNTER — Ambulatory Visit
Admission: RE | Admit: 2011-09-07 | Discharge: 2011-09-07 | Disposition: A | Discharge status: Home / Self Care | Payer: MEDICAID | Source: Ambulatory Visit | Attending: Family Medicine | Admitting: Family Medicine

## 2011-09-07 VITALS — BP 136/88 | HR 84 | Temp 97.8°F | Ht 66.0 in | Wt 208.1 lb

## 2011-09-07 DIAGNOSIS — IMO0002 Reserved for concepts with insufficient information to code with codable children: Secondary | ICD-10-CM

## 2011-09-07 DIAGNOSIS — R630 Anorexia: Secondary | ICD-10-CM

## 2011-09-07 DIAGNOSIS — N644 Mastodynia: Secondary | ICD-10-CM

## 2011-09-07 DIAGNOSIS — R609 Edema, unspecified: Secondary | ICD-10-CM

## 2011-09-07 DIAGNOSIS — M7050 Other bursitis of knee, unspecified knee: Secondary | ICD-10-CM

## 2011-09-07 DIAGNOSIS — Z23 Encounter for immunization: Secondary | ICD-10-CM | POA: Insufficient documentation

## 2011-09-07 DIAGNOSIS — R600 Localized edema: Secondary | ICD-10-CM

## 2011-09-07 DIAGNOSIS — M1710 Unilateral primary osteoarthritis, unspecified knee: Secondary | ICD-10-CM | POA: Insufficient documentation

## 2011-09-07 DIAGNOSIS — M25569 Pain in unspecified knee: Secondary | ICD-10-CM

## 2011-09-07 LAB — CBC/DIFF
BASOPHILS: 1 % (ref 0–1)
BASOS ABS: 0.122 10*3/uL (ref 0.0–0.2)
EOS ABS: 0.112 10*3/uL (ref 0.1–0.3)
EOSINOPHIL: 1 % (ref 1–6)
HCT: 43.6 % (ref 33.5–45.2)
HGB: 15.2 g/dL (ref 11.5–15.2)
LYMPHOCYTES: 39 % (ref 20–45)
LYMPHS ABS: 3.33 10*3/uL (ref 1.0–4.8)
MCH: 29.7 pg (ref 27.4–33.0)
MCHC: 34.8 g/dL (ref 31.6–35.5)
MCV: 85.4 fL (ref 82.0–99.0)
MONOCYTES: 9 % (ref 4–13)
MONOS ABS: 0.721 10*3/uL (ref 0.1–0.9)
MPV: 6.8 FL — ABNORMAL LOW (ref 7.4–10.4)
NRBC'S: 0 /100WBC
PLATELET COUNT: 426 10*3/uL (ref 140–450)
PMN ABS: 4.2 10*3/uL (ref 1.5–7.7)
PMN'S: 50 % (ref 40–75)
RBC: 5.11 MIL/uL — ABNORMAL HIGH (ref 3.84–5.04)
RDW: 13.3 % (ref 10.2–14.0)
WBC: 8.5 10*3/uL (ref 3.5–11.0)

## 2011-09-07 LAB — ELECTROLYTES
ANION GAP: 9 mmol/L (ref 5–16)
CARBON DIOXIDE: 22 mmol/L (ref 22–32)
CHLORIDE: 104 mmol/L (ref 96–111)
POTASSIUM: 3.9 mmol/L (ref 3.5–5.1)
SODIUM: 135 mmol/L — ABNORMAL LOW (ref 136–145)

## 2011-09-07 LAB — BUN
BUN/CREAT RATIO: 13 (ref 6–22)
BUN: 10 mg/dL (ref 6–20)

## 2011-09-07 LAB — CREATININE
CREATININE: 0.79 mg/dL (ref 0.49–1.10)
ESTIMATED GLOMERULAR FILTRATION RATE: >59 mL/min/{1.73_m2} (ref >59)

## 2011-09-07 LAB — ALT (SGPT): ALT (SGPT): 20 U/L (ref 7–45)

## 2011-09-07 LAB — AST (SGOT): AST (SGOT): 22 U/L (ref 8–41)

## 2011-09-07 MED ORDER — ETODOLAC 300 MG CAPSULE
300.00 mg | ORAL_CAPSULE | Freq: Two times a day (BID) | ORAL | Status: DC
Start: 2011-09-07 — End: 2011-11-24

## 2011-09-07 NOTE — Progress Notes (Signed)
09/07/11 1800   Urine test  (Siemens Multistix 10 SG)   Time collected 1813   Color Yellow   Clarity Clear   Glucose Negative   Bilirubin Negative   Ketones Negative   Specific Gravity 1.015   Blood Negative   pH 6.5   Protein Negative   Urobilinogen Normal    Nitrite Negative   Leukocytes Negative   Bottle Number   (Siemens Multistix 10 SG) [id number]   Lot # [id number]   Expiration Date 05/24/12   Burr Medico

## 2011-09-07 NOTE — Patient Instructions (Signed)
Vaccine Information Statement    Inactivated Influenza Vaccine: What You Need to Know  2012 - 2013    Many Vaccine Information Statements are available in Spanish and other languages. See www.immunize.org/vis  Hojas de Informacin Sobre Vacunas estn disponibles en espaol y en muchos otros idiomas. Visite www.immunize.org/vis      1. Why get vaccinated?    Influenza ("flu") is a contagious disease.     It is caused by the influenza virus, which can be spread by coughing, sneezing, or nasal secretions.     Anyone can get influenza, but rates of infection are highest among children. For most people, symptoms last only a few days. They include:  . fever/chills   . sore throat . fatigue  . cough   . headache . muscle aches  . runny or stuffy nose    Other illnesses can have the same symptoms and are often mistaken for influenza.    Young children, people 65 and older, pregnant women, and people with certain health conditions - such as heart, lung or kidney disease or a weakened immune system - can get much sicker. Flu can cause high fever and pneumonia, and make existing medical conditions worse. It can cause diarrhea and seizures in children. Each year thousands of people die from influenza and even more require hospitalization.     By getting flu vaccine you can protect yourself from influenza and may also avoid spreading influenza to others.      2. Inactivated influenza vaccine    There are two types of influenza vaccine:     1. Inactivated (killed) vaccine, the "flu shot," is given by injection with a needle.     2. Live, attenuated (weakened) influenza vaccine is sprayed into the nostrils. This vaccine is described in a separate Vaccine Information Statement.     A "high-dose" inactivated influenza vaccine is available for people 65 years of age and older. Ask your doctor for more information.    Influenza viruses are always changing, so annual vaccination is recommended. Each year scientists try to match the  viruses in the vaccine to those most likely to cause flu that year.  Flu vaccine will not prevent disease from other viruses, including flu viruses not contained in the vaccine.    It takes up to 2 weeks for protection to develop after the shot. Protection lasts about a year.     Some inactivated influenza vaccine contains a preservative called thimerosal. Thimerosal-free influenza vaccine is available. Ask your doctor for more information.      3. Who should get inactivated influenza vaccine and when?    WHO  All people 6 months of age and older should get flu vaccine.    Vaccination is especially important for people at higher risk of severe influenza and their close contacts, including healthcare personnel and close contacts of children younger than 6 months.    WHEN  Get the vaccine as soon as it is available. This should provide protection if the flu season comes early. You can get the vaccine as long as illness is occurring in your community.     Influenza can occur at any time, but most influenza occurs from October through May. In recent seasons, most infections have occurred in January and February. Getting vaccinated in December, or even later, will still be beneficial in most years.    Adults and older children need one dose of influenza vaccine each year. But some children younger than 9 years of   age need two doses to be protected. Ask your doctor.    Influenza vaccine may be given at the same time as other vaccines, including pneumococcal vaccine.      4. Some people should not get inactivated influenza vaccine or should wait.    . Tell your doctor if you have any severe (life-threatening) allergies, including a severe allergy to eggs. A severe allergy to any vaccine component may be a reason not to get the vaccine. Allergic reactions to influenza vaccine are rare.      . Tell your doctor if you ever had a severe reaction after a dose of influenza vaccine.     . Tell your doctor if you ever had  Guillain-Barr Syndrome (a severe paralytic illness, also called GBS). Your doctor will help you decide whether the vaccine is recommended for you.     . People who are moderately or severely ill should usually wait until they recover before getting flu vaccine. If you are ill, talk to your doctor about whether to reschedule the vaccination. People with a mild illness can usually get the vaccine.      5. What are the risks from inactivated influenza vaccine?    A vaccine, like any medicine, could possibly cause serious problems, such as severe allergic reactions. The risk of a vaccine causing serious harm, or death, is extremely small.     Serious problems from inactivated influenza vaccine are very rare. The viruses in inactivated influenza vaccine have been killed, so you cannot get influenza from the vaccine.     Mild problems can include:   . soreness, redness, or swelling where the shot was given   . hoarseness; sore, red or itchy eyes; cough  . fever     . aches  . headache . itching . fatigue  If these problems occur, they usually begin soon after the shot and last 1-2 days.     Moderate problems:  Young children who get inactivated flu vaccine and pneumococcal vaccine (PCV13) at the same time appear to be at increased risk for seizures caused by fever. Ask your doctor for more information.    Tell your doctor if a child who is getting flu vaccine has ever had a seizure.    Severe problems:  . Life-threatening allergic reactions from vaccines are very rare. If they do occur, it is usually within a few minutes to a few hours after the shot.     . In 1976, a type of inactivated influenza (swine flu) vaccine was associated with Guillain-Barr Syndrome (GBS). Since then, flu vaccines have not been clearly linked to GBS. However, if there is a risk of GBS from current flu vaccines, it would be no more than 1 or 2 cases per million people vaccinated. This is much lower than the risk of severe influenza, which can  be prevented by vaccination.    The safety of vaccines is always being monitored.  For more information, visit:   www.cdc.gov/vaccinesafety/Vaccine_Monitoring/Index.html and   www.cdc.gov/vaccinesafety/Activities/Activities_Index.html    One brand of inactivated flu vaccine, called Afluria, should not be given to children 8 years old or younger, except in special circumstances. A related vaccine was associated with fevers and fever-related seizures in young children in [place]. Your doctor can give you more information.                   6. What if there is a severe reaction?    What should I look for?    Any unusual condition, such as a high fever or unusual behavior. Signs of a severe allergic reaction can include difficulty breathing, hoarseness or wheezing, hives, paleness, weakness, a fast heart beat or dizziness.    What should I do?  . Call a doctor, or get the person to a doctor right away.  . Tell the doctor what happened, the date and time it happened, and when the vaccination was given.  . Ask your doctor, nurse, or health department to report the reaction by filing a Vaccine Adverse Event Reporting System (VAERS) form. Or you can file this report through the VAERS website at www.vaers.hhs.gov, or by calling 1-800-822-7967.     VAERS does not provide medical advice.      7. The National Vaccine Injury Compensation Program    The National Vaccine Injury Compensation Program (VICP) was created in 1986.    People who believe they may have been injured by a vaccine can learn about the program and about filing a claim by calling 1-800-338-2382, or visiting the VICP website at ww.hrsa.gov/vaccinecompensation.      8. How can I learn more?    . Ask your doctor. They can give you the vaccine package insert or suggest other sources of information.  . Call your local or state health department.  . Contact the Centers for Disease Control and Prevention (CDC):   - Call 1-800-232-4636 (1-800-CDC-INFO) or   - Visit  CDC's website at www.cdc.gov/flu      Vaccine Information Statement (Interim)  Inactivated Influenza Vaccine (04/26/2011)       42 U.S.C. 300aa-26    Department of Health and Human Services  Centers for Disease Control and Prevention

## 2011-09-07 NOTE — Progress Notes (Signed)
SUBJECTIVE:  [name] is a 55 y.o. female is here today for Knee Swelling, Knee Pain and Decreased Appetite      Appetite down  Has hot liquid come up in her throat at times but no dyspepsia  Has GAINED wt   Notes swelling of her feet often    Has pain in her right breast since the summer  Mammgram normal in Sept  Doesn't feel a lump  No injury, redness or swelling noted  No pleuritic pain    Has knee pain below joint line in the area over the anserine bursa bilaterally      I have reviewed the patient's medical, surgical, family and social history in detail today, 09/07/2011  and updated the computerized patient record as appropriate.    Outpatient Prescriptions Prior to Visit:  omeprazole (PRILOSEC) 20 mg Oral Capsule, Delayed Release(E.C.) take 1 Cap by mouth Once a day. Famotidine treatment failed   duloxetine (CYMBALTA) 60 mg Oral Capsule, Delayed Release(E.C.) take 1 Cap by mouth Once a day.   nystatin-triamcinolone (MYCOLOG II) 100,000-0.1 unit/g-% Apply externally Cream Apply  topically Four times a day.   Halobetasol Propionate (ULTRAVATE) 0.05 % Apply externally Cream Apply  topically. Three times a week   fluticasone (FLONASE) 50 mcg/actuation Nasal Spray, Suspension 2 Sprays by Each Nostril route Once a day.   loratadine (CLARITIN) 10 mg Oral Tablet take 1 Tab by mouth Once a day.   Famotidine (PEPCID) 40 mg Oral Tablet take 1 Tab by mouth Twice daily.   Ranitidine HCl (ZANTAC) 150 mg Oral Tablet take 1 Tab by mouth every night.     ROS: no chest pain   No sob, no PND or orthopnea  OBJECTIVE:   Vitals: BP 144/80  Pulse 84  Temp 36.6 C (97.8 F)  Ht 1.676 m (5\' 6" )  Wt 94.4 kg (208 lb 1.8 oz)  BMI 33.59 kg/m2   Appearance:in no apparent distress  Exam: Neck: supple, symmetrical, trachea midline, no adenopathy and thyroid: not enlarged, symmetric, no tenderness/mass/nodules  Lungs: clear to auscultation bilaterally, normal percussion bilaterally  Heart: regular rate and rhythm, S1, S2  normal, no murmur, click, rub or gallop  Abdomen: soft, non-tender. Bowel sounds normal. No masses,  no organomegaly  Extremities: tender over the anserine bursa of both knees  Urine Dip Results Reviewed:   Time collected: 1813  Glucose: Negative  Bilirubin: Negative  Ketones: Negative  Specific Gravity: 1.015  Blood: Negative  pH: 6.5  Protein: Negative  Urobilinogen: Normal   Nitrite: Negative  Leukocytes: Negative        ASSESSMENT:   PLAN:  1. Knee pain (719.46)     2. Need for prophylactic vaccination and inoculation against influenza (V04.81)  INFLUENZA VACCINE  IM AGE 20 THROUGH ADULT (ADMIN)   3. Pedal edema (782.3)  POCT URINE DIPSTICK, CBC/DIFF, AST (SGOT), ALT (SGPT), ELECTROLYTES, BUN, CREATININE, AST (SGOT), ALT (SGPT)   4. Decreased appetite (783.0)  AST (SGOT), ALT (SGPT)   5. Breast pain, right (611.71)  US BREAST RIGHT, AMB CONSULT/REFERRAL BREAST CLINIC   6. Anserine bursitis (726.61)  AMB CONSULT/REFERRAL PHYS THERAPY- EXTERNAL         Advised to call back directly if there are further questions, or if these symptoms fail to improve as anticipated or worsen.  Theola Sequin, MD  [facility] FAMILY MEDICINE [facility]  FAMILY MEDICINE-[facility]  [address] [facility]  [place] [address]  [phone]

## 2011-09-08 ENCOUNTER — Ambulatory Visit
Admission: RE | Admit: 2011-09-08 | Discharge: 2011-09-08 | Disposition: A | Discharge status: Home / Self Care | Payer: MEDICAID | Source: Ambulatory Visit | Attending: Family Medicine | Admitting: Family Medicine

## 2011-09-08 DIAGNOSIS — N6019 Diffuse cystic mastopathy of unspecified breast: Secondary | ICD-10-CM

## 2011-09-21 ENCOUNTER — Encounter (HOSPITAL_COMMUNITY): Payer: MEDICAID | Admitting: Psychiatry

## 2011-09-22 ENCOUNTER — Encounter (INDEPENDENT_AMBULATORY_CARE_PROVIDER_SITE_OTHER): Payer: Self-pay | Admitting: Family Medicine

## 2011-09-22 NOTE — Progress Notes (Signed)
Plan of Care faxed to [facility] per Dr. Luan Pulling.

## 2011-09-24 ENCOUNTER — Encounter (INDEPENDENT_AMBULATORY_CARE_PROVIDER_SITE_OTHER): Payer: Self-pay | Admitting: Family Medicine

## 2011-09-24 NOTE — Telephone Encounter (Signed)
Message copied by Theola Sequin on Fri Sep 24, 2011  5:55 PM  ------       Message from: Fayrene Fearing       Created: Fri Sep 24, 2011  1:13 PM       Regarding: FW: Referral Question       Contact: [phone]                       ----- Message -----          From: [name]          Sent: 09/24/2011  12:16 PM            To: [facility]       Subject: Referral Question                                          Hi Dr. Shea Stakes,            I have been to physical theapy, My theapist told me that I needed to see my otho Dr, to get another brace for my left knee, the type that is for the knee cap that is out of place. Could you please set up an appointment for me. I know they have only put the medicent in two times, but the swelling is not going down in my right leg. I have Iced my legs a lot but the pain is getting worse in my right leg. The brace I have will not stay up even when I adjust it.                                              Thank You [name]

## 2011-09-24 NOTE — Telephone Encounter (Signed)
u

## 2011-10-01 ENCOUNTER — Ambulatory Visit (INDEPENDENT_AMBULATORY_CARE_PROVIDER_SITE_OTHER): Payer: Self-pay | Admitting: Family Medicine

## 2011-10-01 DIAGNOSIS — M7050 Other bursitis of knee, unspecified knee: Secondary | ICD-10-CM

## 2011-10-01 DIAGNOSIS — M1710 Unilateral primary osteoarthritis, unspecified knee: Secondary | ICD-10-CM

## 2011-10-01 NOTE — Telephone Encounter (Signed)
Message copied by Mcarthur Rossetti on Fri Oct 01, 2011  4:48 PM  ------       Message from: Bailey Mech R       Created: Fri Oct 01, 2011  4:04 PM         >> EMILY R STEWART 10/01/2011 04:04 PM       PATIENT IS CALLING FOR A REFERRAL TO ORTHO FOR RT LEG   PHYSICAL THERAPY IS ADVISING HER IT IS NO BETTER

## 2011-10-04 NOTE — Telephone Encounter (Signed)
Call and tell her I recommend that she go to Sports Med here for this  Theola Sequin, MD

## 2011-10-05 NOTE — Telephone Encounter (Signed)
Called and advised the pt.   Also, pt wants to know if Dr still wants to see her after she sees Dr Dorcas Carrow?   That appt is 12.21.12.

## 2011-10-14 ENCOUNTER — Ambulatory Visit
Admission: RE | Admit: 2011-10-14 | Discharge: 2011-10-14 | Disposition: A | Discharge status: Home / Self Care | Payer: MEDICAID | Source: Ambulatory Visit | Attending: ORTHOPEDIC, SPORTS MEDICINE | Admitting: ORTHOPEDIC, SPORTS MEDICINE

## 2011-10-14 ENCOUNTER — Ambulatory Visit (HOSPITAL_BASED_OUTPATIENT_CLINIC_OR_DEPARTMENT_OTHER): Payer: MEDICAID | Admitting: ORTHOPEDIC, SPORTS MEDICINE

## 2011-10-14 DIAGNOSIS — IMO0002 Reserved for concepts with insufficient information to code with codable children: Secondary | ICD-10-CM | POA: Insufficient documentation

## 2011-10-14 MED ORDER — METHYLPREDNISOLONE 4 MG TABLETS IN A DOSE PACK
ORAL_TABLET | ORAL | Status: DC
Start: 2011-10-14 — End: 2011-10-25

## 2011-10-14 NOTE — Progress Notes (Addendum)
Name: Marque Bango  MEDICAL RECORD [id number]  DATE OF BIRTH: [birth year]  DATE OF SERVICE: 10/14/2011       SUBJECTIVE:  [name] is a 55 y.o. year old who presents to clinic today for evaluation of her right knee pain.  We are seeing this patient at the request of Theola Sequin, MD.  Pain has been present for years, worse over the past 1 month. There has not been antecedent trauma. The pain is localized to the anterior aspect of the joint.  Worsened with activity, especially climbing stairs. Associated symptoms include: giving out. Prior treatments include: Advil, physical therapy (9 sessions), ice/heat. These are not very helpful. The patient is right handed.  In addition, she has worn out her left knee patellar brace and is requesting to be fitted to a new brace today.    PAST MEDICAL HISTORY  Past Medical History   Diagnosis Date   . depression    . Multinodular goiter    . Headache      Migraines   . Arthritis    . Vaginal prolapse    . Rheumatic fever 17     no complications   . Depression    . GERD (gastroesophageal reflux disease)      controlled w/med      PAST SURGICAL HISTORY  Past Surgical History   Procedure Date   . Pb upper gi endoscopy,biopsy 12/19/07     patulous GE junction zone, erythema, nonerosive GERD   . Hx ankle fracture tx 2007     left distal fibula, casted   . Hx wrist fracture tx 2007     FOOSH injury   . Hx partial hysterectomy    . Colonoscopy 01/06/09     COLONOSCOPY performed by Augustin Schooling F at [facility] OR ENDO   . Hx cystocele repair 09/24/09   . Hx gall bladder surgery/chole    . Hx adenoidectomy    . Hx tonsillectomy    . Gastroscopy 03/05/2010     GASTROSCOPY performed by Knox Saliva at [facility] OR ENDO   . Gastroscopy with biopsy 03/05/2010     GASTROSCOPY WITH BIOPSY performed by Knox Saliva at [facility] OR ENDO   . Pb revise ulnar nerve at elbow 1979     MEDICATIONS  Current Outpatient Prescriptions   Medication Sig    . Methylprednisolone (MEDROL DOSEPACK) 4 mg Oral Tablets, Dose Pack Take as instructed.   . Etodolac (LODINE) 300 mg Oral Capsule take 1 Cap by mouth Twice daily.   Marland Kitchen omeprazole (PRILOSEC) 20 mg Oral Capsule, Delayed Release(E.C.) take 1 Cap by mouth Once a day. Famotidine treatment failed   . duloxetine (CYMBALTA) 60 mg Oral Capsule, Delayed Release(E.C.) take 1 Cap by mouth Once a day.   . nystatin-triamcinolone (MYCOLOG II) 100,000-0.1 unit/g-% Apply externally Cream Apply  topically Four times a day.   Marland Kitchen Halobetasol Propionate (ULTRAVATE) 0.05 % Apply externally Cream Apply  topically. Three times a week   . fluticasone (FLONASE) 50 mcg/actuation Nasal Spray, Suspension 2 Sprays by Each Nostril route Once a day.   . loratadine (CLARITIN) 10 mg Oral Tablet take 1 Tab by mouth Once a day.     ALLERGIES  Allergies   Allergen Reactions   . Codeine Itching     RASH   . Mobic (Meloxicam) Nausea/ Vomiting     SOCIAL HISTORY  History     Social History   . Marital Status: Divorced  Spouse Name: N/A     Number of Children: N/A   . Years of Education: N/A     Occupational History   . 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 Smoking status: Former Smoker -- 2.0 packs/day for 40 years     Types: Cigarettes   . Smokeless tobacco: Never Used    Comment: quit 2007   . Alcohol Use: No   . Drug Use: No   . Sexually Active: Not on file     Other Topics Concern   . Ability To Walk 1 Flight Of Steps Without Sob/Cp Yes     Social History Narrative   . No narrative on file     FAMILY HISTORY  Family History   Problem Relation Age of Onset   . Congestive Heart Failure Father    . Heart Attack Sister      76   . Hypertension Brother    . Hypertension Sister    . Hypertension Sister    . Kidney Disease Sister      ESRD   . Stroke Paternal Grandmother    . Diabetes Mother    . Thyroid Disease Sister    . High Cholesterol Mother    . Hypertension Mother    . Coronary Artery Disease Mother 26     CABG    . Bipolar Disorder Daughter      REVIEW OF SYSTEMS  As above, otherwise all other systems are essentially negative.    PHYSICAL EXAM  BP 134/82   Pulse 78   Temp 36.5 C (97.7 F)   Wt 93.441 kg (206 lb)   In no acute distress with appropriate mood and affect.  She is alert and oriented times three.  HEENT: NC/AT Pupils are equal and round.  PULM: respirations are unlabored.  CV: Pulses palpable and equal distally.  GI: Abdomen is nondistended.  DERM: Skin is warm and dry to touch.    bilateral knee:  Range of Motion: Full active ROM on left; 10 degrees less than full extension to 100 degrees of flexion on the right.  Strength: 5/5 flexion and extension on left; 4/5 flexion and extension on right  Tenderness to Palpation: None on left. Diffuse tenderness on right, worse over the pes anserine area and patellar tendon.  Swelling: Baker's cyst present on right along with mild effusion anteriorly.  Special Test: Negative varus/valgus stress tests, posterior drawer, Lachman's, and McMurray's.  Neurovascularly intact in distal bilateral lower extremities.    RADIOGRAPHY:  None available for review.    ASSESSMENT:   1.  Pes anserine bursitis on right  2.  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  Left knee patellar subluxation    PLAN:  For the right knee, we recommended that she continue physical therapy.  We also recommended a short course of oral steroid to help calm down the inflammation in this joint.  A prescription for a Medrol dosepack was provided.  For the left knee, we had our athletic trainer fit a Medi PT Control brace to this knee.  X-rays of the bilateral knees were also ordered and the results are still pending.  She will f/u in clinic on 10/25/11.  If the patient has any questions or concerns prior to next visit, she can contact our office.        Littie Deeds, MD  Fellow in Primary Care Sports Medicine  [place] Department of [facility]  Operated by [facility]    With Dr. Romeo Apple     I have seen and evaluated the patient. I agree with the history, examination, assessment and plan as documented.    Coy Saunas, MD

## 2011-10-14 NOTE — Progress Notes (Signed)
Brace Fitting:  After reviewing our brace policy and giving the patient opportunity to get this brace at another location, they have selected [place] to apply this brace and gave consent for [place] to send their PHI and insurance information to [employer]. I have fitted this patient with a Medi PT Control to their own body dimensions on the Left side and explained how to use it. They are to use this brace as needed. This took approximately 15 minutes.    Arlys John L. Carollee Herter, MS, ATC  Director of Community education officer   Division of Sports Medicine   Department of Family Medicine    Tamala Bari, MD  Associate Director, Division of Sports Medicine  Associate Director, Primary Care Sports Medicine Fellowship  Dept of Family Medicine  [facility] of Medicine

## 2011-10-15 ENCOUNTER — Ambulatory Visit: Payer: MEDICAID | Attending: GENERAL SURGERY | Admitting: Surgical

## 2011-10-15 ENCOUNTER — Encounter (INDEPENDENT_AMBULATORY_CARE_PROVIDER_SITE_OTHER): Payer: Self-pay | Admitting: Surgical

## 2011-10-15 DIAGNOSIS — N644 Mastodynia: Secondary | ICD-10-CM | POA: Insufficient documentation

## 2011-10-15 DIAGNOSIS — N6009 Solitary cyst of unspecified breast: Secondary | ICD-10-CM | POA: Insufficient documentation

## 2011-10-15 NOTE — H&P (Addendum)
Chief Complaint: RIGHT breast pain    [name] is a 55 y.o. White female who presents as a new patient to clinic today for evaluation of RIGHT breast pain. Px states that for the past 3-4 months she has been experiencing R breast pain, sharp in character, intermittent, that radiates to her nipple. She admits that onset of pain is associated with a worsening of her hot flashes. Px admits to worsening of the pain with removal of her bra and has tried Advil and hot soaks with little relief. Denies the presence of nipple discharge, change in appearance of the breast, hx of trauma to the breast, axillary adenopathy or a previous Hx of cyst aspirations, biopsies, previous breast Ca or similar masses that have resolved spontaneously. She states that she lifts things at work however denies episode of muscle strain. Bilateral screening mammogram was performed on 07/22/11 and was negative. RIGHT breast US was performed on 09/07/11 and revealed two simple cysts. She does perform routine self breast exams.   She was referred by: Dr. Luan Pulling    Her Imaging has included:  09/07/11: RIGHT BREAST ULTRASOUND FINDINGS:   Finding 1: There is a normal appearing fibroglandular tissue seen in the central region of the right breast. Finding correlates to the area of pain described by patient in the central region of the right breast.   Finding 2: There are two simple cysts seen in the right breast at 2 o'clock and at 3 o'clock.   IMPRESSION:   Finding 1: Normal appearing fibroglandular tissue in the central region of the right breast is benign in appearance.   Finding 2: Simple cysts in the right breast at 2 o'clock and at 3 o'clock are benign in appearance.   Routine follow-up mammogram in 1 year is recommended.   BI-RADS Category 2: Benign    Last Mammogram:   07/22/11: BILATERAL MAMMOGRAM FINDINGS   The breasts are heterogeneously dense, which may lower the sensitivity of this mammogram.    No suspicious masses, significant calcifications or other abnormalities are seen.   IMPRESSION:   There is no mammographic evidence of malignancy.   Routine follow-up mammogram in 1 year is recommended.   BI-RADS Category 1: Negative    Breast cancer risk factors include family hx on father's side. 5 maternal aunts and two first cousins    HIGH RISK factors include: none      GYN HISTORY:   Age of menarche was 69.   Patient is G4P3.   Age of menopause was 89. Surgical menopause; 1 ovary intact   Age of first live birth was 58.   Patient denies hormonal therapy.      PMH:   Past Medical History   Diagnosis Date   . depression    . Multinodular goiter    . Headache      Migraines   . Arthritis    . Vaginal prolapse    . Rheumatic fever 17     no complications   . Depression    . GERD (gastroesophageal reflux disease)      controlled w/med     Past Surgical History   Procedure Date   . Pb upper gi endoscopy,biopsy 12/19/07     patulous GE junction zone, erythema, nonerosive GERD   . Hx ankle fracture tx 2007     left distal fibula, casted   . Hx wrist fracture tx 2007     FOOSH injury   . Hx partial hysterectomy    .  Colonoscopy 01/06/09     COLONOSCOPY performed by Augustin Schooling F at [facility] OR ENDO   . Hx cystocele repair 09/24/09   . Hx gall bladder surgery/chole    . Hx adenoidectomy    . Hx tonsillectomy    . Gastroscopy 03/05/2010     GASTROSCOPY performed by Knox Saliva at [facility] OR ENDO   . Gastroscopy with biopsy 03/05/2010     GASTROSCOPY WITH BIOPSY performed by Knox Saliva at [facility] OR ENDO   . Pb revise ulnar nerve at elbow 1979     Family History   Problem Relation Age of Onset   . Congestive Heart Failure Father    . Heart Attack Sister      16   . Hypertension Brother    . Hypertension Sister    . Hypertension Sister    . Kidney Disease Sister      ESRD   . Stroke Paternal Grandmother    . Diabetes Mother    . Thyroid Disease Sister    . High Cholesterol Mother    . Hypertension Mother     . Coronary Artery Disease Mother 49     CABG   . Bipolar Disorder Daughter    . Cancer Paternal Aunt      Breast Ca   . Cancer Paternal Aunt      Breast Ca   . Cancer Paternal Aunt      Breast Ca   . Cancer Paternal Aunt      Breast Ca   . Cancer Paternal Aunt      Breast Ca   . Cancer Paternal Uncle      Leukemia   . Cancer Other 56     Breast Ca   . Cancer Other 60     Breast Ca     Current Outpatient Prescriptions   Medication Sig Dispense Refill   . Methylprednisolone (MEDROL DOSEPACK) 4 mg Oral Tablets, Dose Pack Take as instructed.  21 Tab  0   . Etodolac (LODINE) 300 mg Oral Capsule take 1 Cap by mouth Twice daily.  60 Cap  0   . omeprazole (PRILOSEC) 20 mg Oral Capsule, Delayed Release(E.C.) take 1 Cap by mouth Once a day. Famotidine treatment failed  30 Cap  11   . duloxetine (CYMBALTA) 60 mg Oral Capsule, Delayed Release(E.C.) take 1 Cap by mouth Once a day.  30 Cap  11   . nystatin-triamcinolone (MYCOLOG II) 100,000-0.1 unit/g-% Apply externally Cream Apply  topically Four times a day.  60 g  1   . Halobetasol Propionate (ULTRAVATE) 0.05 % Apply externally Cream Apply  topically. Three times a week  15 g  2   . fluticasone (FLONASE) 50 mcg/actuation Nasal Spray, Suspension 2 Sprays by Each Nostril route Once a day.  16 g  11   . loratadine (CLARITIN) 10 mg Oral Tablet take 1 Tab by mouth Once a day.  90 Tab  1     Allergies   Allergen Reactions   . Codeine Itching     RASH   . Mobic (Meloxicam) Nausea/ Vomiting     History     Social History   . Marital Status: Divorced     Occupational History   . 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 Smoking status: Former Smoker -- 2.0 packs/day for 40 years     Types: Cigarettes   . Smokeless tobacco: Never Used  Comment: quit 2007   . Alcohol Use: No   . Drug Use: No   . Sexually Active: Not on file     Other Topics Concern   . Ability To Walk 1 Flight Of Steps Without Sob/Cp Yes       ROS:   General: Very pleasant; Denies fever or chills or recent weight loss/ gain  EENT: Denies double/ blurry vision, tinnitus, nasal congestion/ trouble breathing, or trouble with/ painful swallowing  Cardiovascular: Denies Chest pain or palpitations  Respiratory: Denies SOB or cough  Gastrointestinal: admits to reflux disease; Denies abdominal pain, Nausea/ vomiting, constipation/ diarrhea  GU: Denies dysuria or hematuria  Musculoskeletal: admits to arthritis; Denies weakness   Neurological: admits to occasional migraines; Denies dizziness, or numbness/ tingling of the extremities   Hematologic: admits to hx of blood clot in the leg secondary to trauma  Psychiatric: admits to depression    Objective:  BP 140/80   Temp 37.4 C (99.4 F)   Ht 1.651 m (5\' 5" )   Wt 94.3 kg (207 lb 14.3 oz)   BMI 34.60 kg/m2      Physical Exam:   Constitutional: Very pleasant, well nourished female in no apparent distress  HNT: Normocephalic, atraumatic. Nose is negative for blood or polyps. Neck is supple and thyroid is not enlarged and symmetric.  Cardiovascular: Heart sounds are of regular rate and rhythm  Pulmonary: breath sounds clear to auscultation bilaterally  BREAST:    RIGHT breast: generalized tenderness to palpation and point tenderness w/ manipulation of simple cyst at 3 O'clock. Breast is Symmetric in shape. Negative for masses or dimpling     Areola: consistent pigmentation, negative for ulceration.    Nipple:  No evidence of ulceration, discharge or retraction    Axilla: Negative for Palpable LN     LEFT breast:  Symmetric in shape. Negative for masses, dimpling or tenderness    Areola: consistent pigmentation, negative for ulceration.    Nipple:  No evidence of ulceration, discharge or retraction    Axilla: Negative for Palpable LN  Skin: warm and dry without rash  Psychiatric: Alert and Oriented. Minimal Anxiety observed.    Assessment and Plan:  1. Breast pain, right (611.71)  AMB CONSULT/REFERRAL BREAST CLINIC    2. Benign breast cyst in female (610.0)         The patient's normal examination and imaging were discussed with her today. Clinical breast exam performed today is normal other than generalized tenderness and appreciation of cyst. The etiology of simple cysts, breast pain and its decline after menopause was discussed with her.  She was encouraged to try Vit. E daily and evening primrose oil. She will continue with Advil and hot soaks for relief. The importance of a well fitted bra was also encouraged. She will continue her self examinations.  She will return to clinic in two months for follow up and continue routine health care maintenance with her PCP Dr. Luan Pulling. She will be due for breast imaging, bilateral screening mammogram, in September 2013.  She was given the opportunity to ask questions and those questions were answered to her satisfaction. She was encouraged to call with any additional questions or concerns.     Emmaline Life, PA-C 10/15/2011, 10:28 AM    I was physically present in the clinic during the patients visit.  I have reviewed the Physician Assistant's note and concur with the assessment and plan as noted. Any exceptions/additions are edited/noted.    Irven Baltimore, MD

## 2011-10-18 ENCOUNTER — Encounter (INDEPENDENT_AMBULATORY_CARE_PROVIDER_SITE_OTHER): Payer: MEDICAID | Admitting: Family Medicine

## 2011-10-20 ENCOUNTER — Encounter (INDEPENDENT_AMBULATORY_CARE_PROVIDER_SITE_OTHER): Payer: MEDICAID | Admitting: Family Medicine

## 2011-10-25 ENCOUNTER — Ambulatory Visit: Payer: MEDICAID | Attending: ORTHOPEDIC, SPORTS MEDICINE | Admitting: ORTHOPEDIC, SPORTS MEDICINE

## 2011-10-25 ENCOUNTER — Encounter (INDEPENDENT_AMBULATORY_CARE_PROVIDER_SITE_OTHER): Payer: Self-pay | Admitting: ORTHOPEDIC, SPORTS MEDICINE

## 2011-10-25 DIAGNOSIS — M1710 Unilateral primary osteoarthritis, unspecified knee: Secondary | ICD-10-CM | POA: Insufficient documentation

## 2011-10-25 DIAGNOSIS — M7650 Patellar tendinitis, unspecified knee: Secondary | ICD-10-CM | POA: Insufficient documentation

## 2011-10-25 MED ORDER — PREDNISONE 50 MG TABLET
50.0000 mg | ORAL_TABLET | Freq: Every day | ORAL | Status: DC
Start: 2011-10-25 — End: 2012-10-04

## 2011-10-25 NOTE — Progress Notes (Signed)
Sports Medicine at [employer]  Department of [facility] Medicine  [employer]    Progress Note  Subjective:  [name] is a 55 y.o. female patient here for follow up of bilateral knee pain on referral by Dr. Luan Pulling.   --Started on Medrol Dosepak at last visit to aid with pain from OA and pes anserine bursitis -> improvement in swelling on the right over the bursa and improvement in pain in both knees except after physical therapy  --Still some swelling, and now some pain over anterolateral aspect of proximal tibia (which is a constant pain)  --Both knees have increased pain with prolonged sitting  --No locking/catching, no giving way since last visit  --Overall approximately 40% improved    PFMSHx: I have reviewed and updated as appropriate the past medical, family and social history.   PMHx:  Past Medical History   Diagnosis Date   . depression    . Multinodular goiter    . Headache      Migraines   . Arthritis    . Vaginal prolapse    . Rheumatic fever 17     no complications   . Depression    . GERD (gastroesophageal reflux disease)      controlled w/med     Past Surgical History   Procedure Date   . Pb upper gi endoscopy,biopsy 12/19/07     patulous GE junction zone, erythema, nonerosive GERD   . Hx ankle fracture tx 2007     left distal fibula, casted   . Hx wrist fracture tx 2007     FOOSH injury   . Hx partial hysterectomy    . Colonoscopy 01/06/09     COLONOSCOPY performed by Augustin Schooling F at [facility] OR ENDO   . Hx cystocele repair 09/24/09   . Hx gall bladder surgery/chole    . Hx adenoidectomy    . Hx tonsillectomy    . Gastroscopy 03/05/2010     GASTROSCOPY performed by Knox Saliva at [facility] OR ENDO   . Gastroscopy with biopsy 03/05/2010     GASTROSCOPY WITH BIOPSY performed by Knox Saliva at [facility] OR ENDO   . Pb revise ulnar nerve at elbow 1979       Medications:   Outpatient Prescriptions Marked as Taking for the 10/25/11 encounter (Office Visit) with Coy Saunas, MD   Medication Sig Dispense Refill   . Etodolac (LODINE) 300 mg Oral Capsule take 1 Cap by mouth Twice daily.  60 Cap  0   . omeprazole (PRILOSEC) 20 mg Oral Capsule, Delayed Release(E.C.) take 1 Cap by mouth Once a day. Famotidine treatment failed  30 Cap  11   . duloxetine (CYMBALTA) 60 mg Oral Capsule, Delayed Release(E.C.) take 1 Cap by mouth Once a day.  30 Cap  11   . nystatin-triamcinolone (MYCOLOG II) 100,000-0.1 unit/g-% Apply externally Cream Apply  topically Four times a day.  60 g  1   . Halobetasol Propionate (ULTRAVATE) 0.05 % Apply externally Cream Apply  topically. Three times a week  15 g  2   . fluticasone (FLONASE) 50 mcg/actuation Nasal Spray, Suspension 2 Sprays by Each Nostril route Once a day.  16 g  11   . loratadine (CLARITIN) 10 mg Oral Tablet take 1 Tab by mouth Once a day.  90 Tab  1       Allergies:  Allergies   Allergen Reactions   . Codeine Itching  RASH   . Mobic (Meloxicam) Nausea/ Vomiting       SocHx:  History   Substance Use Topics   . Smoking status: Former Smoker -- 2.0 packs/day for 40 years     Types: Cigarettes     Quit date: 10/14/2005   . Smokeless tobacco: Never Used    Comment: quit 2007   . Alcohol Use: Yes      1 glass wine/month       Review of Systems:  10-point ROS negative in detail except as noted above.      Objective:  BP 132/78   Pulse 76   Temp(Src) 36.4 C (97.6 F) (Thermal Scan)   Ht 1.651 m (5\' 5" )   Wt 94.802 kg (209 lb)   BMI 34.78 kg/m2  General: WNWD in NAD.   HEENT: Normocephalic, atraumatic, PERRL, EOMI, mucous membranes moist.  CV: Good perfusion throughout.  Pulm: Breathing comfortably on room air.   MSK: Bilateral knee exams demonstrate tenderness to palpation over bilateral pes anserine bursae (R>L) and bilateral patellar tendons (L>R) and medial patellar facet on the left, AROM 0-135 degrees with 5/5 strength flexion/extension on the left with pain with resisted extension and 4/5 strength flexion/extension on the right with pain with resisted extension, negative Lachman's, posterior drawer, varus/valgus at 0 and 20 degrees, and McMurray's testing bilaterally    Xrays: Reviewed for bilateral knees from 10/14/2011 demonstrating mild tricompartmental osteoarthritis (with some mild spurring and sclerosis).      Assessment and Plan:    726.61 Pes anserinus tendinitis or bursitis  726.64 Patellar tendinitis  715.96 Osteoarthritis, knee  Comment: Given pain and inflammation in multiple areas, with improvement with prior Medrol Dosepak, will proceed with prednisone burst rather than considering any injections at this point.  Plan: predniSONE (DELTASONE) 50 mg Oral Tablet; also continue physical therapy          Follow up in 3 weeks, sooner if needed.       Tamala Bari, MD  Assistant Professor  Associate Director, Primary Care Sports Medicine Fellowship  Dept of Family Medicine  [facility]  Operated by [facility]

## 2011-10-29 ENCOUNTER — Encounter (INDEPENDENT_AMBULATORY_CARE_PROVIDER_SITE_OTHER): Payer: Self-pay | Admitting: Family Medicine

## 2011-10-29 NOTE — Progress Notes (Signed)
Physical Therapy Recertification note was faxed to [employer] on 1.4.13

## 2011-11-09 ENCOUNTER — Ambulatory Visit: Payer: MEDICAID | Attending: ORTHOPEDIC, SPORTS MEDICINE

## 2011-11-09 ENCOUNTER — Encounter (INDEPENDENT_AMBULATORY_CARE_PROVIDER_SITE_OTHER): Payer: Self-pay

## 2011-11-09 VITALS — BP 134/84 | HR 100 | Temp 98.5°F | Wt 208.0 lb

## 2011-11-09 DIAGNOSIS — J329 Chronic sinusitis, unspecified: Secondary | ICD-10-CM | POA: Insufficient documentation

## 2011-11-09 MED ORDER — AMOXICILLIN 500 MG CAPSULE
1000.00 mg | ORAL_CAPSULE | Freq: Three times a day (TID) | ORAL | Status: DC
Start: 2011-11-09 — End: 2011-11-23

## 2011-11-09 NOTE — Progress Notes (Addendum)
Patient ID:  [name] is a 56 y.o. female  CC:  URI    Subjective  HPI:  [name] reports to clinic with the above complaints.  The patient's primary symptoms are headache, productive cough, sore throat, myalgias, rhinorrhea.  Onset was 4 days ago.  Symptoms are improving.  No sick contacts.  She has tried Nyquil with some relief.    Social History:  Patient denies tobacco use.    Allergies:    Allergies   Allergen Reactions   . Codeine Itching     RASH   . Mobic (Meloxicam) Nausea/ Vomiting       Review of Systems:  Positive for cold chills and mild dyspnea.  Negative for fevers, night sweats, tooth pain, chest pain, dyspnea, nausea, vomiting, diarrhea.      Objective  BP 134/84   Pulse 100   Temp 36.9 C (98.5 F)   Wt 94.348 kg (208 lb)    General:  Well-developed white female in no acute distress.  HEENT:  NC/AT.  TM's clear bilaterally.  No facial tenderness or pain with leaning forward.  Nasal mucosa pink.  Nasal turbinates not enlarged or boggy.  No purulent nasal discharge.  Oral mucosa pink and moist without lesions.  Normal dentition.  Tonsils not enlarged and without erythema or exudate.  Oropharynx without erythema, exudate, or hyperemia.  Neck:  Trachea midline and nontender to manipulation.  Thyroid nontender.  Lymphatic:  No head/neck LAD noted.  Cardiac:  RRR without murmur.  S1 + S2 normal.  Radial pulses 2+ and equal.  Pulmonary:  CTAB without wheezes/rhonchi.    Assessment/Plan:    473.9 Sinusitis   Plan: Prescription provided for amoxicillin 1000 mg po tid for 10 days.      RTC as needed.        Littie Deeds, MD  Fellow in Primary Care Sports Medicine  [place] [facility]  Operated by [facility]    I reviewed the fellow's note and was immediately available during and after the visit.  I agree with the findings and plan of care as documented in the fellow's note.  Any exceptions/additions are edited/noted.    Coy Saunas, MD

## 2011-11-18 ENCOUNTER — Other Ambulatory Visit (INDEPENDENT_AMBULATORY_CARE_PROVIDER_SITE_OTHER): Payer: MEDICAID | Admitting: ORTHOPEDIC, SPORTS MEDICINE

## 2011-11-23 ENCOUNTER — Ambulatory Visit: Payer: MEDICAID | Attending: Family Medicine | Admitting: Family Medicine

## 2011-11-23 ENCOUNTER — Encounter (INDEPENDENT_AMBULATORY_CARE_PROVIDER_SITE_OTHER): Payer: Self-pay | Admitting: Family Medicine

## 2011-11-23 VITALS — BP 126/84 | HR 80 | Temp 98.0°F | Ht 65.0 in | Wt 208.0 lb

## 2011-11-23 DIAGNOSIS — L02239 Carbuncle of trunk, unspecified: Secondary | ICD-10-CM | POA: Insufficient documentation

## 2011-11-23 DIAGNOSIS — IMO0002 Reserved for concepts with insufficient information to code with codable children: Secondary | ICD-10-CM | POA: Insufficient documentation

## 2011-11-23 DIAGNOSIS — M7050 Other bursitis of knee, unspecified knee: Secondary | ICD-10-CM

## 2011-11-23 DIAGNOSIS — Z760 Encounter for issue of repeat prescription: Secondary | ICD-10-CM | POA: Insufficient documentation

## 2011-11-23 DIAGNOSIS — L02229 Furuncle of trunk, unspecified: Secondary | ICD-10-CM

## 2011-11-23 DIAGNOSIS — E042 Nontoxic multinodular goiter: Secondary | ICD-10-CM

## 2011-11-23 MED ORDER — MINOCYCLINE 100 MG CAPSULE
100.0000 mg | ORAL_CAPSULE | Freq: Every day | ORAL | Status: DC
Start: 2011-11-23 — End: 2013-05-30

## 2011-11-23 NOTE — Progress Notes (Signed)
SUBJECTIVE:  [name] is a 56 y.o. female is here today for Medication Check      She is doing much better with her bilateral knee pain (OA, anserine bursitis) and is doing PT  She has Sports Med appt tomorrow    She has recurrent boils on her right breast in various spots, seems to start where her bra rubs and turns into a furuncle, now has one resolving on the medial lower quadrant  No fever    Has multinodular goiter which has been stable every year on [place], last check 9/11.  No new symptoms    I have reviewed the patient's medical, surgical, family and social history in detail today, 11/23/2011  and updated the computerized patient record as appropriate.    Outpatient Prescriptions Prior to Visit:  Etodolac (LODINE) 300 mg Oral Capsule take 1 Cap by mouth Twice daily.   omeprazole (PRILOSEC) 20 mg Oral Capsule, Delayed Release(E.C.) take 1 Cap by mouth Once a day. Famotidine treatment failed   duloxetine (CYMBALTA) 60 mg Oral Capsule, Delayed Release(E.C.) take 1 Cap by mouth Once a day.   nystatin-triamcinolone (MYCOLOG II) 100,000-0.1 unit/g-% Apply externally Cream Apply  topically Four times a day.   Halobetasol Propionate (ULTRAVATE) 0.05 % Apply externally Cream Apply  topically. Three times a week   fluticasone (FLONASE) 50 mcg/actuation Nasal Spray, Suspension 2 Sprays by Each Nostril route Once a day.   loratadine (CLARITIN) 10 mg Oral Tablet take 1 Tab by mouth Once a day.   amoxicillin (AMOXIL) 500 mg Oral Capsule take 2 Caps by mouth Three times a day.   predniSONE (DELTASONE) 50 mg Oral Tablet take 1 Tab by mouth Once a day.     ROS: mood improved  OBJECTIVE:   Vitals: BP 126/84  Pulse 80  Temp(Src) 36.7 C (98 F) (Thermal Scan)  Ht 1.651 m (5\' 5" )  Wt 94.348 kg (208 lb)  BMI 34.61 kg/m2   Appearance:in no apparent distress  Exam: Skin: one cm furuncle on breast, crusted and mildly tender not fluctuant  Lungs clear  Tender over Right anserine bursitis    ASSESSMENT:   PLAN:  1. Boil  of trunk (680.2)  Minocycline (MINOCIN) 100 mg Oral Capsule one bid  For 10 days if another boil occurs   2. Multinodular goiter (241.1)  Plan to repeat US in Sept     3. Anserine bursitis (726.61)  Keep Sports Med follow up     Return in about 6 months (around 05/22/2012) for physical.  Advised to call back directly if there are further questions, or if these symptoms fail to improve as anticipated or worsen.  Theola Sequin, MD  [facility] FAM[facility]  FAMILY MEDICINE-[facility]  [address]  [place] [id number]  [phone]

## 2011-11-24 ENCOUNTER — Ambulatory Visit: Payer: MEDICAID | Attending: ORTHOPEDIC, SPORTS MEDICINE | Admitting: ORTHOPEDIC, SPORTS MEDICINE

## 2011-11-24 MED ORDER — NAPROXEN 375 MG TABLET
375.00 mg | ORAL_TABLET | Freq: Two times a day (BID) | ORAL | Status: DC
Start: 2011-11-24 — End: 2012-10-04

## 2011-11-24 NOTE — Progress Notes (Signed)
Sports Medicine at [employer]  Department of [facility] Medicine  [employer]    Progress Note  Subjective:  [name] is a 56 y.o. female patient here for follow up of right knee pain on referral by Dr. Luan Pulling initially.   Diagnosed as pes anserine bursitis, patellar tendinitis, and knee OA initially, responded some to Medrol so at last visit repeated steroid course but with prednisone  Pain is better, but still there, as is swelling, though that also is decreased from where it was - overall she is pleased with response to steroid course  Currently taking Aleve occasionally to aid with pain, and it does    Continuing with physical therapy which seems to be helping as well, albeit more quickly on the left as compared to the right  No catching, giving way    PFMSHx: I have reviewed and updated as appropriate the past medical and social history.   PMHx:  Past Medical History   Diagnosis Date   . depression    . Multinodular goiter    . Headache      Migraines   . Arthritis    . Vaginal prolapse    . Rheumatic fever 17     no complications   . Depression    . GERD (gastroesophageal reflux disease)      controlled w/med       Medications:  Outpatient Prescriptions Marked as Taking for the 11/24/11 encounter (Office Visit) with Coy Saunas, MD   Medication Sig Dispense Refill   . Minocycline (MINOCIN) 100 mg Oral Capsule Take 1 Cap (100 mg total) by mouth Once a day. Use for 10 days if need for recurrent boils  10 Cap  0   . Etodolac (LODINE) 300 mg Oral Capsule take 1 Cap by mouth Twice daily.  60 Cap  0   . omeprazole (PRILOSEC) 20 mg Oral Capsule, Delayed Release(E.C.) take 1 Cap by mouth Once a day. Famotidine treatment failed  30 Cap  11   . duloxetine (CYMBALTA) 60 mg Oral Capsule, Delayed Release(E.C.) take 1 Cap by mouth Once a day.  30 Cap  11    . nystatin-triamcinolone (MYCOLOG II) 100,000-0.1 unit/g-% Apply externally Cream Apply  topically Four times a day.  60 g  1   . Halobetasol Propionate (ULTRAVATE) 0.05 % Apply externally Cream Apply  topically. Three times a week  15 g  2   . fluticasone (FLONASE) 50 mcg/actuation Nasal Spray, Suspension 2 Sprays by Each Nostril route Once a day.  16 g  11   . loratadine (CLARITIN) 10 mg Oral Tablet take 1 Tab by mouth Once a day.  90 Tab  1       Allergies:  Allergies   Allergen Reactions   . Codeine Itching     RASH   . Mobic (Meloxicam) Nausea/ Vomiting       SocHx:  History   Substance Use Topics   . Smoking status: Former Smoker -- 2.0 packs/day for 40 years     Types: Cigarettes     Quit date: 10/14/2005   . Smokeless tobacco: Never Used    Comment: quit 2007   . Alcohol Use: Yes      1 glass wine/month       Review of Systems:  10-point ROS negative in detail except as noted above.      Objective:  BP 132/80   Pulse 90   Wt 95.255  kg (210 lb)  General: WNWD in NAD.   HEENT: Normocephalic, atraumatic, PERRL, EOMI, mucous membranes moist.  CV: Good perfusion throughout.  Pulm: Breathing comfortably on room air.  MSK: Right knee exam demonstrates medial swelling over the area of the pes anserine bursa without erythema or ecchymosis; tenderness to palpation over the pes anserine bursa, patellar tendon, and lateral joint line; full AROM in flexion/extension; 5-/5 strength in flexion/extension; negative patellar grind, Lachman's, anterior/posterior drawer, varus/valgus stress at 0 and 20 degrees, and McMurray's testing.      Assessment and Plan:    726.61 Pes anserine bursitis  726.64 Patellar tendinitis  715.96 Osteoarthrosis, unspecified whether generalized or localized, lower leg  719.46 Pain in joint, lower leg  Comment: Overall improved, though not resolved.  Plan: naproxen (NAPROSYN) 375 mg Oral Tablet as needed; continue physical therapy    Follow up in 1 month, sooner if needed.        Tamala Bari, MD  Assistant Professor  Associate Director, Primary Care Sports Medicine Fellowship  Dept of Family Medicine  [facility]  Operated by [facility]

## 2011-11-26 ENCOUNTER — Encounter (HOSPITAL_COMMUNITY): Payer: MEDICAID | Admitting: Psychiatry

## 2011-12-08 ENCOUNTER — Telehealth (HOSPITAL_COMMUNITY): Payer: Self-pay | Admitting: Psychiatry

## 2011-12-08 ENCOUNTER — Ambulatory Visit (INDEPENDENT_AMBULATORY_CARE_PROVIDER_SITE_OTHER): Payer: MEDICAID | Admitting: GENERAL

## 2011-12-08 NOTE — Telephone Encounter (Signed)
 Patient called c/o insomnia. She reports that she is tired but can't sleep. Denies worry or mood symptoms. Denies excessive energy or goal directed behavior. Patient will make appointment with me for next available and with another resident for a sooner check-in. Called in Ambien  5 mg #5 no refills. Discussed risks and benefits in detail including not to take with Ultracet . Also informed patient of interaction of Cymbalta  and Ultracet .    Reyes CHRISTELLA Flatter, MD 12/08/2011, 10:15 AM

## 2011-12-10 ENCOUNTER — Ambulatory Visit (INDEPENDENT_AMBULATORY_CARE_PROVIDER_SITE_OTHER): Payer: MEDICAID | Admitting: Psychiatry

## 2011-12-10 NOTE — Progress Notes (Addendum)
[name]  [id number]  [birth year]  12/10/2011    Chief Complaint   Patient presents with   . Other     Medication management         Subjective:    This is a 56 year old female who normally sees [name].  She is here for a one-time medication check after she reported to [name] that she was suffering from insomnia.  She reports several medical stressors including "fever blisters" on her foot, a displaced patella, and inability to engage in PT for arthritis due to the foot blisters.  She reports she took one Ambien and started to doze off pretty quickly, but then began having stomach cramps and diarrhea which lasted into the next day.  She attributes this to the Valtrex she was started on for her foot blisters, as she was having "stomach churning" prior to taking the Ambien.  She has discontinued the Valtrex.  The diarrhea has resolved but she continues to have abdominal pain and bloating.      In regard to the insomnia, she is unsure what was keeping her up.  She states her mind was "completely blank."  She denies any out of character behavior during this time.  She denies talking too fast or having racing thoughts during this time.  She does not believe that pain was keeping her awake.  She denies SI, HI, and AVH.  She reports she is still struggling some with her depression.        Objective:    Mental Status Examination  Appearance: Casually dressed, good hygiene, wearing left knee brace and bedroom slippers, appears to be in pain several times during interview   Behavior: Pleasant and cooperative  Level of consciousness: Alert  Orientation: Grossly normal  Motor:  No psychomotor agitation or retardation   Speech: Regular rate and volume  Attention: Adequate for conversation  Memory: Grossly normal   Mood: somewhat depressed   Affect: stable   Thought process: Linear and goal-directed  Thought content: Appropriate.  No SI or HI voiced or suspected   Perception: Does not appear to be responding to internal stimuli  Insight:  Fair to good  Judgement:  Good      Assessment:  Axis I: Major Depressive Disorder  Axis II: None  Axis III: rectocele repair, bilateral knee arthritis, neck mass, GERD, pending r/o H. Pylori, history of multinodular goiter, migraines  Axis IV: medical stressors     Plan:      Continue Cymbalta 60 mg daily.  Patient would likely benefit from further medication management of depressive symptoms and/or insomnia; however, I am reluctant to make any changes to medication regimen at this time until abdominal pain and bloating have resolved.  Advised patient to return to the [facility] where she was prescribed the Valtrex as soon as possible today and she was agreeable to this plan.  Advised her to call her primary psychiatric provider, [name], when she is feeling better.  Follow up with [name] in two months as scheduled or earlier if needed.      Casimer Bilis, MD 12/10/2011, 11:47 PM        I saw and examined the patient.  I reviewed the resident's note.  I agree with the findings and plan of care as documented in the resident's note.  Any exceptions/additions are edited/noted.    Germaine Pomfret, MD 12/13/2011, 2:55 PM

## 2011-12-17 ENCOUNTER — Encounter (INDEPENDENT_AMBULATORY_CARE_PROVIDER_SITE_OTHER): Payer: Self-pay | Admitting: Surgical

## 2011-12-20 ENCOUNTER — Ambulatory Visit: Payer: MEDICAID | Attending: DERMATOLOGY | Admitting: Dermatology

## 2011-12-20 ENCOUNTER — Encounter (INDEPENDENT_AMBULATORY_CARE_PROVIDER_SITE_OTHER): Payer: Self-pay | Admitting: Dermatology

## 2011-12-20 VITALS — BP 112/72 | Ht 65.35 in | Wt 207.0 lb

## 2011-12-20 DIAGNOSIS — L659 Nonscarring hair loss, unspecified: Secondary | ICD-10-CM | POA: Insufficient documentation

## 2011-12-20 DIAGNOSIS — IMO0002 Reserved for concepts with insufficient information to code with codable children: Secondary | ICD-10-CM | POA: Insufficient documentation

## 2011-12-20 NOTE — Progress Notes (Addendum)
Subjective:       Patient ID: [name] is a 56 y.o. female     Chief Complaint:   Chief Complaint   Patient presents with   . 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  She has a new complaint today of clustered blisters on the bilateral toes.  States she was seen at urgent care and diagnosed with herpetic whitlow.  Admits to being treated with acyclovir and a cream (she thinks the cream was antifungal.)  She denies other skin related complaints.  States toes have healed mostly.      Review of Systems   Constitutional: Negative for fever and chills.   Skin: Positive for itching. Negative for rash.      Current Outpatient Prescriptions   Medication Sig   . naproxen (NAPROSYN) 375 mg Oral Tablet Take 1 Tab (375 mg total) by mouth Twice daily with food.   . Minocycline (MINOCIN) 100 mg Oral Capsule Take 1 Cap (100 mg total) by mouth Once a day. Use for 10 days if need for recurrent boils   . omeprazole (PRILOSEC) 20 mg Oral Capsule, Delayed Release(E.C.) take 1 Cap by mouth Once a day. Famotidine treatment failed   . duloxetine (CYMBALTA) 60 mg Oral Capsule, Delayed Release(E.C.) take 1 Cap by mouth Once a day.   . nystatin-triamcinolone (MYCOLOG II) 100,000-0.1 unit/g-% Apply externally Cream Apply  topically Four times a day.   Marland Kitchen Halobetasol Propionate (ULTRAVATE) 0.05 % Apply externally Cream Apply  topically. Three times a week   . fluticasone (FLONASE) 50 mcg/actuation Nasal Spray, Suspension 2 Sprays by Each Nostril route Once a day.   . loratadine (CLARITIN) 10 mg Oral Tablet take 1 Tab by mouth Once a day.   . predniSONE (DELTASONE) 50 mg Oral Tablet take 1 Tab by mouth Once a day.     Objective:   .    Filed Vitals:    12/20/11 0924   BP: 112/72   Height: 1.66 m (5' 5.35")   Weight: 93.9 kg (207 lb 0.2 oz)       Physical Exam    Constitutional: She appears well-developed and well-nourished. No distress.   HENT:   Head:       Neurological: She is alert.   Skin: No rash noted. She is not diaphoretic. There is erythema. No pallor.          General skin exam was performed including head, neck, anterio/posterior trunk, bilateral upper and lower extremitites and revealed no areas of concern other than those documented.     Assessment & Plan:     1. 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  I reviewed the resident's note.  I agree with the findings and plan of care as documented in the resident's note.  Any exceptions/additions are edited/noted.    Miles Costain, MD 12/20/2011, 2:07 PM

## 2011-12-29 ENCOUNTER — Other Ambulatory Visit (INDEPENDENT_AMBULATORY_CARE_PROVIDER_SITE_OTHER): Payer: MEDICAID | Admitting: ORTHOPEDIC, SPORTS MEDICINE

## 2012-01-04 ENCOUNTER — Ambulatory Visit (INDEPENDENT_AMBULATORY_CARE_PROVIDER_SITE_OTHER): Payer: Self-pay | Admitting: Dermatology

## 2012-01-04 NOTE — Telephone Encounter (Signed)
Message copied by Robin Searing on Tue Jan 04, 2012  4:38 PM  ------       Message from: Michel Bickers       Created: Tue Jan 04, 2012  4:23 PM       Regarding: message         >> Michel Bickers 01/04/2012 04:23 PM       Rudene Christians, MD              Patient is out of town she was doing well, and now her feet are broke out in blisters.  She said she is now getting the blisters on her hands.  She doent know if its because she is applying the medicine you prescribed to her feet with her hands.                     You can reach her at, [phone]  Or cell [phone].   She said her insurance will not cover her to be seen out of state.              [facility] [phone]

## 2012-01-12 ENCOUNTER — Ambulatory Visit: Payer: MEDICAID | Attending: DERMATOLOGY | Admitting: Dermatology

## 2012-01-12 DIAGNOSIS — L301 Dyshidrosis [pompholyx]: Secondary | ICD-10-CM | POA: Insufficient documentation

## 2012-01-12 MED ORDER — DESOXIMETASONE 0.25 % TOPICAL OINTMENT
TOPICAL_OINTMENT | Freq: Two times a day (BID) | CUTANEOUS | Status: DC
Start: 2012-01-12 — End: 2012-07-24

## 2012-01-12 NOTE — Progress Notes (Addendum)
Subjective:       Patient ID: [name] is a 56 y.o. female     Chief Complaint:     Chief Complaint   Patient presents with   . Skin Check     38-month f/u. Blisters on hands/feet.        HPI 56 yo female with blistering on the palms and soles presenting for follow-up.  States the clobetasol ointment did not help and actually made the rash itch worse.  She is concerned about an allergy to clobetasol.  States she cannot stand the itching.  She is washing her hands regularly with alcohol based hand sanitizers.  She is also using regular scented soaps and shampoos.  She denies improvement previously with terbinafine and with ketoconazole.  No other skin related complaints.      Review of Systems   Constitutional: Negative for fever and chills.   Skin: Positive for itching and rash.     Current Outpatient Prescriptions   Medication Sig   . Desoximetasone 0.25 % Apply externally Ointment by Apply externally route Twice daily   . naproxen (NAPROSYN) 375 mg Oral Tablet Take 1 Tab (375 mg total) by mouth Twice daily with food.   . Minocycline (MINOCIN) 100 mg Oral Capsule Take 1 Cap (100 mg total) by mouth Once a day. Use for 10 days if need for recurrent boils   . predniSONE (DELTASONE) 50 mg Oral Tablet take 1 Tab by mouth Once a day.   Marland Kitchen omeprazole (PRILOSEC) 20 mg Oral Capsule, Delayed Release(E.C.) take 1 Cap by mouth Once a day. Famotidine treatment failed   . duloxetine (CYMBALTA) 60 mg Oral Capsule, Delayed Release(E.C.) take 1 Cap by mouth Once a day.   . nystatin-triamcinolone (MYCOLOG II) 100,000-0.1 unit/g-% Apply externally Cream Apply  topically Four times a day.   Marland Kitchen Halobetasol Propionate (ULTRAVATE) 0.05 % Apply externally Cream Apply  topically. Three times a week   . fluticasone (FLONASE) 50 mcg/actuation Nasal Spray, Suspension 2 Sprays by Each Nostril route Once a day.   . loratadine (CLARITIN) 10 mg Oral Tablet take 1 Tab by mouth Once a day.       Objective:   .  Filed Vitals:     01/12/12 1058   BP: 118/84   Weight: 94.7 kg (208 lb 12.4 oz)       Physical Exam   Constitutional: She appears well-developed and well-nourished. No distress.   Neurological: She is alert.   Skin: No rash noted. She is not diaphoretic. No erythema. No pallor.          General skin exam was performed including head, neck, anterio/posterior trunk, bilateral upper and lower extremitites and revealed no areas of concern other than those documented.     Assessment & Plan:     1. 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  I reviewed the resident's note.  I agree with the findings and plan of care as documented in the resident's note.  Any exceptions/additions are edited/noted.    Miles Costain, MD 01/12/2012, 1:16 PM

## 2012-01-17 ENCOUNTER — Ambulatory Visit: Payer: MEDICAID | Attending: DERMATOLOGY | Admitting: Dermatology

## 2012-01-17 ENCOUNTER — Encounter (INDEPENDENT_AMBULATORY_CARE_PROVIDER_SITE_OTHER): Payer: Self-pay | Admitting: Dermatology

## 2012-01-17 DIAGNOSIS — L301 Dyshidrosis [pompholyx]: Secondary | ICD-10-CM | POA: Insufficient documentation

## 2012-01-17 NOTE — Progress Notes (Addendum)
S: 56 yo female with dyshidrotic eczema with possible component of contact dermatitis presenting for patch test placement.     O:   Filed Vitals:    01/17/12 0908   BP: 128/82   Height: 1.658 m (5' 5.28")   Weight: 93.1 kg (205 lb 4 oz)     Back is clear at site of patch application    A/P: Possible contact dermatitis  -patch test placed  -advised patient to keep back dry and to return to clinic in 2 days for initial patch test reading    Rudene Christians, MD  See resident's note for details. I saw and examined the patient and agree with the resident's findings and plan as written except as noted, and I was present and supervised/observed all procedures in their entirety.    Miles Costain, MD 01/17/2012, 1:23 PM

## 2012-01-17 NOTE — Procedures (Addendum)
See progress note.   Marykate Heuberger L Treyvon Blahut, MD

## 2012-01-18 ENCOUNTER — Other Ambulatory Visit (INDEPENDENT_AMBULATORY_CARE_PROVIDER_SITE_OTHER): Payer: Self-pay | Admitting: Dermatology

## 2012-01-18 ENCOUNTER — Ambulatory Visit (INDEPENDENT_AMBULATORY_CARE_PROVIDER_SITE_OTHER): Payer: Self-pay | Admitting: Dermatology

## 2012-01-18 MED ORDER — TRIAMCINOLONE ACETONIDE 0.1 % TOPICAL OINTMENT
TOPICAL_OINTMENT | Freq: Two times a day (BID) | CUTANEOUS | Status: DC
Start: 2012-01-18 — End: 2012-07-24

## 2012-01-18 NOTE — Telephone Encounter (Signed)
Dr Gibson Ramp, [facility] Medicaid will not cover the desoximetasone until she has 5 days of the preferred products. I found on the her med list she has already tried and failed halobetasol so the other things left are 1) clobetasol-I think she already had this, 2) betamethasone cream or lotion, 3) fluocinonide cream, gel,soln, or emollient, and 4) triamcinolone cream or ointment.  Let me know if you want me to call her.  Thanks, [facility], LPN 1/61/0960, 4:54 AM

## 2012-01-19 ENCOUNTER — Ambulatory Visit: Payer: MEDICAID | Attending: DERMATOLOGY

## 2012-01-19 DIAGNOSIS — L301 Dyshidrosis [pompholyx]: Secondary | ICD-10-CM | POA: Insufficient documentation

## 2012-01-19 NOTE — Progress Notes (Addendum)
S: 56 yo female with dyshidrotic eczema with possible component of contact dermatitis presenting for patch test reading (two day reading.)       O:   1+ reaction to thimerosol    A/P: Possible contact dermatitis  -doubt clinical relevance of thimerosol  -return to clinic in 2 days for final patch test reading    Rudene Christians, MD      I saw and examined the patient.  I reviewed the resident's note.  I agree with the findings and plan of care as documented in the resident's note.  Any exceptions/additions are edited/noted.    Miles Costain, MD 01/19/2012, 1:53 PM

## 2012-01-21 ENCOUNTER — Ambulatory Visit: Payer: MEDICAID | Attending: Dermatology

## 2012-01-21 DIAGNOSIS — L301 Dyshidrosis [pompholyx]: Secondary | ICD-10-CM | POA: Insufficient documentation

## 2012-01-21 MED ORDER — CLOBETASOL 0.05 % TOPICAL OINTMENT
TOPICAL_OINTMENT | Freq: Two times a day (BID) | CUTANEOUS | Status: DC
Start: 2012-01-21 — End: 2013-03-29

## 2012-01-24 NOTE — Progress Notes (Addendum)
S: 56 yo female with dyshidrotic eczema with possible component of contact dermatitis presenting for final patch test reading.    O:   1+ reaction to thimerosol  1+ reaction to potassium dichromate    A/P: Possible contact dermatitis  -doubt clinical relevance of thimerosol  -handouts given on thimerosol and potassium dichromate  -will try clobetasol ointment BID   -RTC in 1 month, sooner as needed.       Steele Berg, MD

## 2012-02-10 ENCOUNTER — Ambulatory Visit (INDEPENDENT_AMBULATORY_CARE_PROVIDER_SITE_OTHER): Payer: MEDICAID | Admitting: Psychiatry

## 2012-02-10 ENCOUNTER — Other Ambulatory Visit (INDEPENDENT_AMBULATORY_CARE_PROVIDER_SITE_OTHER): Payer: Self-pay | Admitting: Otolaryngology

## 2012-02-10 ENCOUNTER — Encounter (HOSPITAL_COMMUNITY): Payer: Self-pay | Admitting: Psychiatry

## 2012-02-10 MED ORDER — FLUOXETINE 20 MG CAPSULE
20.0000 mg | ORAL_CAPSULE | Freq: Every day | ORAL | Status: DC
Start: 2012-02-10 — End: 2012-03-23
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
## 2012-03-01 ENCOUNTER — Ambulatory Visit: Payer: MEDICAID | Attending: Geriatric Medicine | Admitting: Geriatric Medicine

## 2012-03-01 ENCOUNTER — Encounter (INDEPENDENT_AMBULATORY_CARE_PROVIDER_SITE_OTHER): Payer: Self-pay | Admitting: Geriatric Medicine

## 2012-03-01 VITALS — BP 126/84 | HR 92 | Temp 98.2°F | Ht 65.2 in | Wt 207.9 lb

## 2012-03-01 DIAGNOSIS — R21 Rash and other nonspecific skin eruption: Secondary | ICD-10-CM | POA: Insufficient documentation

## 2012-03-01 MED ORDER — HYDROCORTISONE-OATMEAL-ALOE-VITAMIN E 1 % TOPICAL CREAM
TOPICAL_CREAM | Freq: Two times a day (BID) | CUTANEOUS | Status: DC
Start: 2012-03-01 — End: 2012-07-24

## 2012-03-01 MED ORDER — HYDROXYZINE HCL 25 MG TABLET
25.00 mg | ORAL_TABLET | Freq: Three times a day (TID) | ORAL | Status: DC | PRN
Start: 2012-03-01 — End: 2012-10-04

## 2012-03-01 NOTE — Progress Notes (Signed)
[facility]                              [address] [facility]                                [place], [place] [address]                                PATIENT NAME: [name], [name] [facility] UEAVWU:[id number]  DATE OF SERVICE:03/01/2012  DATE OF BIRTH: May 21, 1956    PROGRESS NOTE    CHIEF COMPLAINT:  Itching.    SUBJECTIVE:  [name] has been dealing with a rash of several months' duration.  She states that she has seen urgent care and a dermatologist.  Benadryl is not working.  She says the rash is somewhat improved, but there are new macules appearing.    OBJECTIVE:  Vital Signs:  BP 126/84   Pulse 92   Temp 36.8 C (98.2 F) (Thermal Scan)   Ht 1.656 m (5' 5.2")   Wt 94.3 kg (207 lb 14.3 oz)   BMI 34.38 kg/m2     Weight:    Wt Readings from Last 3 Encounters:   03/01/12 94.3 kg (207 lb 14.3 oz)   01/17/12 93.1 kg (205 lb 4 oz)   01/12/12 94.7 kg (208 lb 12.4 oz)          CHEST:  CTAP.  Heart:  No murmurs, rubs or gallops.  Abdomen:  No HSM, tenderness or mass.  Examination of the skin reveals some macules, and deep papules in the palm.    ASSESSMENT:  Rash.    PLAN:  Will follow up with dermatology.  I will give Vistaril pending final disposition by the dermatologist.      Phoebe Sharps, MD  Professor  [facility] Department of Family Medicine    [id number]; D: 03/01/2012 10:14:22; T: 03/01/2012 84:69:62

## 2012-03-08 ENCOUNTER — Ambulatory Visit: Payer: MEDICAID | Attending: DERMATOLOGY | Admitting: Dermatology

## 2012-03-08 VITALS — BP 132/78 | Wt 209.2 lb

## 2012-03-08 DIAGNOSIS — L301 Dyshidrosis [pompholyx]: Secondary | ICD-10-CM | POA: Insufficient documentation

## 2012-03-08 NOTE — Progress Notes (Addendum)
Subjective:       Patient ID: [name] is a 56 y.o. female     Chief Complaint:     Chief Complaint   Patient presents with   . Rash     1-2 month f/u.        Rash  Pertinent negatives include no fever.   Patient is a 56 y.o. female presenting with rash.    56 yo female with blistering on the palms and soles presenting for follow-up.  [History of 1+ positive patch test to thimerosal and potassium dichromate.]  States hands look better.  She continues to have a burning sensation her right palm which is not improved with topical steroids.  Also has developed new itchy lesions on the bilateral ankles.  No other skin related complaints.  She does have a history of "nerve damage" in the neck.    No other skin related complaints.      Review of Systems   Constitutional: Negative for fever and chills.   Skin: Positive for itching and rash.     Current Outpatient Prescriptions   Medication Sig   . hydrOXYzine HCl (ATARAX) 25 mg Oral Tablet Take 1 Tab (25 mg total) by mouth Three times a day as needed for Itching   . Hydrocortisone-Oatmeal-Aloe-E (AVEENO) 1 % Apply externally Cream by Apply externally route Twice daily   . FLUoxetine (PROZAC) 20 mg Oral Capsule Take 1 Cap (20 mg total) by mouth Once a day Patient allergic to blue dye - please avoid blue capsule. If necessary, may substitute tablet.   . Clobetasol (TEMOVATE) 0.05 % Apply externally Ointment Apply topically Twice daily To rash on hands and feet   . triamcinolone acetonide (ARISTOCORT A) 0.1 % Apply externally Ointment by Apply Topically route Twice daily   . Desoximetasone 0.25 % Apply externally Ointment by Apply externally route Twice daily   . predniSONE (DELTASONE) 50 mg Oral Tablet take 1 Tab by mouth Once a day.   Marland Kitchen omeprazole (PRILOSEC) 20 mg Oral Capsule, Delayed Release(E.C.) take 1 Cap by mouth Once a day. Famotidine treatment failed   . duloxetine (CYMBALTA) 60 mg Oral Capsule, Delayed Release(E.C.) take 1 Cap by mouth Once a day.    . nystatin-triamcinolone (MYCOLOG II) 100,000-0.1 unit/g-% Apply externally Cream Apply  topically Four times a day.   Marland Kitchen Halobetasol Propionate (ULTRAVATE) 0.05 % Apply externally Cream Apply  topically. Three times a week   . fluticasone (FLONASE) 50 mcg/actuation Nasal Spray, Suspension 2 Sprays by Each Nostril route Once a day.   . loratadine (CLARITIN) 10 mg Oral Tablet take 1 Tab by mouth Once a day.   . naproxen (NAPROSYN) 375 mg Oral Tablet Take 1 Tab (375 mg total) by mouth Twice daily with food.   . Minocycline (MINOCIN) 100 mg Oral Capsule Take 1 Cap (100 mg total) by mouth Once a day. Use for 10 days if need for recurrent boils       Objective:   .    Filed Vitals:    03/08/12 0901   BP: 132/78   Weight: 94.9 kg (209 lb 3.5 oz)       Physical Exam   Constitutional: She appears well-developed and well-nourished. No distress.   Neurological: She is alert.   Skin: No rash noted. She is not diaphoretic. No erythema. No pallor.          General skin exam was performed including head, neck, anterio/posterior trunk, bilateral upper and lower extremitites and  revealed no areas of concern other than those documented.     Assessment & Plan:     1. 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  Possible contact dermatitis (see number 2 on body diagram) - recommended testing of component of shoe on unaffected area on the arm to see if potassium dichromate in patient's leather shoes may be contributing to lower extremity lesions    Rudene Christians, MD            I saw and examined the patient.  I reviewed the resident's note.  I agree with the findings and plan of care as documented in the resident's note.  Any exceptions/additions are edited/noted.     Miles Costain, MD 03/08/2012, 12:45 PM

## 2012-03-23 ENCOUNTER — Ambulatory Visit (INDEPENDENT_AMBULATORY_CARE_PROVIDER_SITE_OTHER): Payer: MEDICAID | Admitting: Psychiatry

## 2012-03-23 MED ORDER — BUPROPION HCL XL 150 MG 24 HR TABLET, EXTENDED RELEASE
150.00 mg | ORAL_TABLET | Freq: Every morning | ORAL | Status: DC
Start: 2012-03-23 — End: 2012-08-21

## 2012-03-23 MED ORDER — FLUOXETINE 20 MG TABLET
20.0000 mg | ORAL_TABLET | Freq: Every day | ORAL | Status: DC
Start: 2012-03-23 — End: 2012-05-24

## 2012-03-26 NOTE — Progress Notes (Addendum)
[name]  [id number]  10/20/1956  03/26/2012 late entry for 03/23/2012    Chief Complaint: depression    Subjective:  Patient reports that she is sleeping excessively. She is having increasing anhedonia, impairment of concentration, loss of appetite, and feelings of guilt and worthlessness. She denies SI at this time, but she feels that these symptoms are similar to her prior major depressive episodes, however they are not yet as severe. She continues to tolerate Prozac well, but it is not as helpful at this time as it was when it was started. She thinks that she may have tried Wellbutrin in the past, but she cannot recall whether she has for sure or what it was like if she did.    Objective:    Mental Status Examination  Appearance: Casually dressed, well groomed, good hygiene  Behavior: Pleasant and cooperative  Level of consciousness: Alert  Orientation: Grossly normal  Motor: No movement abnormalities  Speech: Regular rate, rhythm, and volume  Attention: Adequate for conversation, no excessive distractibility  Eye contact: Good  Memory: No impairment of immediate, delayed, or distant recall  Mood: depressed  Affect: tearful, congruent with mood  Thought process: Linear and goal-directed  Thought content: Appropriate, no paranoia, no delusions  Perception: No audiovisual hallucinations  Suicide ideation: None  Homicide ideation: None        Medications:  Current Outpatient Prescriptions   Medication Sig   . buPROPion (BUPEPRION XL) 150 mg Oral Tablet Sustained Release 24 hr Take 1 Tab (150 mg total) by mouth Every morning   . FLUoxetine (PROZAC) 20 mg Oral Tablet Take 1 Tab (20 mg total) by mouth Once a day Tablet necessary, allergy to dye in capsule.   . hydrOXYzine HCl (ATARAX) 25 mg Oral Tablet Take 1 Tab (25 mg total) by mouth Three times a day as needed for Itching   . Hydrocortisone-Oatmeal-Aloe-E (AVEENO) 1 % Apply externally Cream by Apply externally route Twice daily    . Clobetasol (TEMOVATE) 0.05 % Apply externally Ointment Apply topically Twice daily To rash on hands and feet   . triamcinolone acetonide (ARISTOCORT A) 0.1 % Apply externally Ointment by Apply Topically route Twice daily   . Desoximetasone 0.25 % Apply externally Ointment by Apply externally route Twice daily   . naproxen (NAPROSYN) 375 mg Oral Tablet Take 1 Tab (375 mg total) by mouth Twice daily with food.   . Minocycline (MINOCIN) 100 mg Oral Capsule Take 1 Cap (100 mg total) by mouth Once a day. Use for 10 days if need for recurrent boils   . predniSONE (DELTASONE) 50 mg Oral Tablet take 1 Tab by mouth Once a day.   Marland Kitchen omeprazole (PRILOSEC) 20 mg Oral Capsule, Delayed Release(E.C.) take 1 Cap by mouth Once a day. Famotidine treatment failed   . duloxetine (CYMBALTA) 60 mg Oral Capsule, Delayed Release(E.C.) take 1 Cap by mouth Once a day.   . nystatin-triamcinolone (MYCOLOG II) 100,000-0.1 unit/g-% Apply externally Cream Apply  topically Four times a day.   Marland Kitchen Halobetasol Propionate (ULTRAVATE) 0.05 % Apply externally Cream Apply  topically. Three times a week   . fluticasone (FLONASE) 50 mcg/actuation Nasal Spray, Suspension 2 Sprays by Each Nostril route Once a day.   . loratadine (CLARITIN) 10 mg Oral Tablet take 1 Tab by mouth Once a day.           Assessment:  Axis I: Major Depressive Disorder  Axis II: None  Axis III: rectocele repair, bilateral knee arthritis, neck  mass, GERD, pending r/o H. Pylori, history of multinodular goiter, migraines  Axis IV: medical illness  Axis V: GAF: 51    Plan:    Continue Prozac 20 mg daily. Start Wellbutrin XL 150 mg daily. RTC next available.  Patient advised to call with any questions or concerns.    Farris Has. Brown Dunlap, MD 03/26/2012, 3:34 PM        I saw and examined the patient.  I reviewed the resident's note.  I agree with the findings and plan of care as documented in the resident's note.  Any exceptions/additions are edited/noted.     Germaine Pomfret, MD 03/27/2012, 1:26 PM

## 2012-03-28 ENCOUNTER — Ambulatory Visit (INDEPENDENT_AMBULATORY_CARE_PROVIDER_SITE_OTHER): Payer: MEDICAID | Admitting: Psychology

## 2012-03-29 NOTE — Progress Notes (Signed)
 [name]   Medical Record #: [id number]  Date of Birth: [birth year]  Date of Service: 03/28/12  Time in/out: 9:00 to 9:50  Total time: 50 minutes    Chief Complaint   Patient presents with   . Major Depression        SUBJECTIVE:  Patient reported that she has been experiencing depression for approximately 20 years or more. She feels that her depression was well controlled in recent years until a few months ago. Patient had been taking Cymbalta  and had to switch to Prozac  a few months ago, stating that the Cymbalta  had been causing blisters on her feet. She currently takes Prozac  20 mg and Wellbutrin  150 mg. Patient noted that her most significant stressor currently is caring for her brother who has schizophrenia. Patient's brother lived with an elderly couple for several years, and during that time he was well cared for. However, the woman who was caring for patient's brother passed away last year. Since that time, patient's brother has continued to live with the elderly man but patient does not feel that her brother is being cared for or looked after as closely now. As such, patient has been getting frequent telephone calls from her brother. She estimated that he calls her 15-20 times per day. Stated that he calls about everything - sometimes because there is a problem, other times just to tell her what he is doing. He has gotten very angry in the past if she did not answer her telephone, and he has left her angry messages. Patient told her brother at one point that he needed to start calling her less. Soon after that, he wandered off and was found in someone's yard. He was not in good condition and was taken to the hospital. Patient feels guilty that this happened and is now worried about setting boundaries with her brother. Patient also mentioned that she does not have any social support. She is divorced. She has three children, but she is not able to rely on them for support for various reasons. She has  few friends and feels that her friends take advantage of [her] so she is not able to rely on them either.    Patient is currently taking classes online for her BA (in social work or a related field). She is in her third year of the program. Stated that her depression makes studying difficult for her.    Patient stated that she was hospitalized at [facility] in 2007 for having a manic episode. She saw two therapists since 2007, each for about 1-1.5 years. Stated that she would like to work with a therapist that will be supportive and understanding. She would like for someone to help her set better boundaries with her brother.    OBJECTIVE:   Orientation: person, place, time and situation   Appearance: casually dressed   Mood: depressed.     Affect: congruent to mood.     Thought process: goal directed/coherent   Insight: fair   Judgment: fair   Current Suicidal Thoughts: No    ASSESSMENT:    1. MDD (major depressive disorder), recurrent episode (296.30)      Axis II  Deferred  Axis III see problem list  Axis IV  Problems related to social environment  Axis V  51-60 moderate symptoms    PLAN: Obtained history from patient. Discussed patient's goals for therapy and preferences regarding a therapist. Plan is to give patient's information to the clinic manager so she  can be referred to an in house therapist for supportive therapy.    Levorn Daring, Ph.D.  Assistant Professor  Department of Behavioral Medicine and Psychiatry    Problem list reviewed for psychiatric diagnoses only.

## 2012-04-19 ENCOUNTER — Ambulatory Visit (INDEPENDENT_AMBULATORY_CARE_PROVIDER_SITE_OTHER): Payer: MEDICAID | Admitting: Clinical

## 2012-04-21 NOTE — Progress Notes (Addendum)
[name]   [id number]  [birth year]    Date of appointment: 04/19/12    Time in:   11:00                    Time out:  11:45    Chief Complaint: Individual therapy     Subjective: Pt arrived to appointment on time.  Pt suffering with poor sleep, low appetite and trouble concentrating.  Pt seeking help to develop and utilize boundaries with her family and friends, as well as looking to develop positive coping skills.  Pt became tearful explaining that she feels she helps everyone else but no one helps her.  Pt suffers from numerous stressors in her life, especially involving her immediate family and the mental health issues with which they suffer.     Objective: Mood sad, depressed.  Affect was tearful.  Denies suicidal ideation.     Assessment: Dx: Major Depressive Disorder, recurrent episode    Procedure: Provided validation and support. Discussed development and use of boundaries.  Reinforced patient's determination to stay positive and to remain hopeful that situation will improve.     Plan: Will see in 1 week.           Milus Mallick, MSW, LGSW      Late entry for 6.26.13. I reviewed the therapist's note.  I agree with the findings and plan of care as documented in the note.  Any exceptions/additions are edited/noted.    Harmon Crews, MD 04/26/2012, 4:46 PM

## 2012-04-26 ENCOUNTER — Encounter (INDEPENDENT_AMBULATORY_CARE_PROVIDER_SITE_OTHER): Payer: MEDICAID | Admitting: Dermatology

## 2012-05-22 ENCOUNTER — Encounter (HOSPITAL_COMMUNITY): Payer: MEDICAID | Admitting: Psychiatry

## 2012-05-24 ENCOUNTER — Other Ambulatory Visit (HOSPITAL_COMMUNITY): Payer: Self-pay | Admitting: Psychiatry

## 2012-05-24 ENCOUNTER — Ambulatory Visit (INDEPENDENT_AMBULATORY_CARE_PROVIDER_SITE_OTHER): Payer: MEDICAID | Admitting: Clinical

## 2012-05-24 MED ORDER — FLUOXETINE 20 MG TABLET
20.0000 mg | ORAL_TABLET | Freq: Every day | ORAL | Status: DC
Start: 2012-05-24 — End: 2012-08-21

## 2012-05-24 NOTE — Progress Notes (Addendum)
PATIENT NAME:  [name]  DATE OF BIRTH:  June 12, 1956  CHART NUMBER: [id number]    DATE OF SERVICE: 05/24/2012      DURATION: 45 MIN  TIME IN:  1:00  TIME OUT: 1:45    Chief Complaint   Patient presents with   . Psycho Therapy       SUBJECTIVE: Pt arrived for individual session, casually dressed, with tears in her eyes. Pt continues to have stressors related to her family members. Pt feels that she is the only person who cares about/helps others and becomes overwhelmed with sadness when no one is able to help her. Pt has been taking online courses and her recent course required her to write a sort of auto-biographical paper. Pt reports having increased anxiety, sadness and loneliness after recalling many traumatic events of her childhood. Pt is not able to identify what she needs for herself, but is quick to identify what her family members need. Encouraged pt to explore these feelings and try to identify what it is that she needs to heal and move forward. Also encouraged pt to make a list of things she does for others on a daily basis and to bring it to next session for [place] to review. Denies SI.    OBSERVATION:     Mood:  depressed and overwhelmed     Affect: appears sad        ASSESSMENT:      Axis I: Major Depressive Disorder, recurrent episode (296.30)     Axis II: Deferred     Axis III: See Problem List     Axis IV: Stressors with family relationships, financial restraints and history of trauma     Axis V: 55    PROCEDURE:  Individual Therapy.    PLAN:   Pt to return in one month.      Terri Skains, MSW, LGSW  Clinical Therapist    Late entry for 05-24-12. I discussed and reviewed the therapist's notes.  I agree with the findings and plan of care as documented in the  note.  Any exceptions/additions are edited/noted.    Neola Crews, MD 05/30/2012, 2:52 PM

## 2012-06-19 ENCOUNTER — Encounter (INDEPENDENT_AMBULATORY_CARE_PROVIDER_SITE_OTHER): Payer: Self-pay | Admitting: Family Medicine

## 2012-06-19 ENCOUNTER — Ambulatory Visit: Payer: MEDICAID | Attending: Family Medicine | Admitting: Family Medicine

## 2012-06-19 VITALS — BP 104/60 | HR 96 | Temp 98.2°F | Wt 212.5 lb

## 2012-06-19 DIAGNOSIS — H5789 Other specified disorders of eye and adnexa: Secondary | ICD-10-CM | POA: Insufficient documentation

## 2012-06-19 MED ORDER — ERYTHROMYCIN 5 MG/GRAM (0.5 %) EYE OINTMENT
TOPICAL_OINTMENT | OPHTHALMIC | Status: DC
Start: 2012-06-19 — End: 2012-10-24

## 2012-06-19 MED ORDER — AMOXICILLIN 875 MG-POTASSIUM CLAVULANATE 125 MG TABLET
1.0000 | ORAL_TABLET | Freq: Two times a day (BID) | ORAL | Status: DC
Start: 2012-06-19 — End: 2012-07-24

## 2012-06-19 NOTE — Progress Notes (Addendum)
Subjective:     Patient ID:  [name] is an 56 y.o. female   Chief Complaint:    Chief Complaint   Patient presents with   . Eye Swelling       HPI:56 yr old female presented to the clinic today with Right eye swelling.Pt reports that she suddenly started to have pain in the right eye few days ago.She describe the pain as dull,3/10 in severity constant with radiation to her check.No aggrevating or relieving factors reported.She has been applying hot soaks on her eye but it didn't get better.She is also having associated swelling with the pain and she noticed some white crusty drainage but no fever or chills reported.She was admitted to the family medicine service in 2011 with preseptal cellulitis and was treated with I/V Antibiotics.She didn't apply any antibiotic ointment to the eye.  She is also having associated blurry vision but denied any double vision.    Review of Systems   Constitutional: Negative for fever and chills.   HENT: Negative for nosebleeds and congestion.    Eyes: Positive for blurred vision, pain, discharge and redness. Negative for photophobia.   Respiratory: Negative for cough.    Musculoskeletal: Negative for myalgias.   Skin: Negative for rash.   Neurological: Negative for headaches.   :  Objective:BP 104/60   Pulse 96   Temp 36.8 C (98.2 F)   Wt 96.4 kg (212 lb 8.4 oz)     Physical Exam   Constitutional: She appears well-developed. No distress.   HENT:   Head: Normocephalic and atraumatic.   Right Ear: External ear normal.   Left Ear: External ear normal.   Eyes: EOM are normal. Right eye exhibits discharge. Left eye exhibits no discharge. No scleral icterus.        Chemosis noted on Right lower eye lid with mild tenderness.EOM intact   Cardiovascular: Normal rate, regular rhythm, normal heart sounds and intact distal pulses.  Exam reveals no gallop and no friction rub.    No murmur heard.  Skin: She is not diaphoretic.     .    Assessment & Plan:      373.00 Swelling of right eye  (primary encounter diagnosis)  Comment: Could be secondary to blepharitis or pre septal cellulitis Will start her on oral A/B as she had a history of Preseptal cellulitis.  Plan: amoxicillin-pot clavulanate (AUGMENTIN) 875-125        mg Oral Tablet, erythromycin (ROMYCIN) 5         mg/gram (0.5 %) Ophthalmic Ointment          S/S of worsening discussed in detail with the pt and advised her to come to the ED if she develops any of those symptoms and will order CT scan to rule out orbital cellulitis.    Close clinic follow up.    Erskin Burnet, MD 06/19/2012, 3:37 PM  Jakayden Cancio Ebony Cargo  Department of Family Medicine PGY-III  [facility]      Prior to or immediately following the patient's discharge today, I reviewed and concur with the history, examination, test, diagnosis, and plan of the resident physician.    Raynelle Dick M.D.  Professor  Department of Family Medicine  Operated [facility]

## 2012-06-23 ENCOUNTER — Encounter (INDEPENDENT_AMBULATORY_CARE_PROVIDER_SITE_OTHER): Payer: MEDICAID | Admitting: Geriatric Medicine

## 2012-06-27 ENCOUNTER — Encounter (HOSPITAL_COMMUNITY): Payer: MEDICAID | Admitting: Clinical

## 2012-07-24 ENCOUNTER — Ambulatory Visit: Payer: MEDICAID | Attending: Family Medicine | Admitting: Family Medicine

## 2012-07-24 ENCOUNTER — Encounter (INDEPENDENT_AMBULATORY_CARE_PROVIDER_SITE_OTHER): Payer: Self-pay | Admitting: Family Medicine

## 2012-07-24 VITALS — BP 118/78 | HR 84 | Temp 97.8°F | Ht 65.2 in | Wt 210.3 lb

## 2012-07-24 DIAGNOSIS — B379 Candidiasis, unspecified: Secondary | ICD-10-CM | POA: Insufficient documentation

## 2012-07-24 DIAGNOSIS — Z23 Encounter for immunization: Secondary | ICD-10-CM | POA: Insufficient documentation

## 2012-07-24 DIAGNOSIS — K219 Gastro-esophageal reflux disease without esophagitis: Secondary | ICD-10-CM | POA: Insufficient documentation

## 2012-07-24 DIAGNOSIS — L940 Localized scleroderma [morphea]: Secondary | ICD-10-CM | POA: Insufficient documentation

## 2012-07-24 DIAGNOSIS — E042 Nontoxic multinodular goiter: Secondary | ICD-10-CM | POA: Insufficient documentation

## 2012-07-24 DIAGNOSIS — E039 Hypothyroidism, unspecified: Secondary | ICD-10-CM | POA: Insufficient documentation

## 2012-07-24 MED ORDER — NYSTATIN 100,000 UNIT/GRAM TOPICAL POWDER
CUTANEOUS | Status: DC
Start: 2012-07-24 — End: 2015-05-01

## 2012-07-24 MED ORDER — HALOBETASOL PROPIONATE 0.05 % TOPICAL CREAM
TOPICAL_CREAM | CUTANEOUS | Status: DC
Start: 2012-07-24 — End: 2014-08-19

## 2012-07-24 MED ORDER — MICONAZOLE NITRATE 2 % VAGINAL CREAM
TOPICAL_CREAM | VAGINAL | Status: DC
Start: 2012-07-24 — End: 2012-10-24

## 2012-07-24 NOTE — Progress Notes (Signed)
SUBJECTIVE:  [name] is a 56 y.o. female is here today for Thyroid Check      Patient has Hypothyroidism and multiple thyroid nodules  . Thyroid ROS: denies fatigue, weight changes, heat/cold intolerance, bowel/skin changes or CVS symptoms.   Last [place] was stable in 2011, had reassuring FNA in 2008  TSH   Date Value Range Status   10/14/2008 0.904  0.300 - 5.900 uIU/mL Final        THYROID STIMULATING HORMONE WITH FREE T4 REFLEX   Date Value Range Status   11/20/2010 1.100  0.300 - 5.900 uIU/mL Final           Derm has determined that she's allergic to nicklel, leather and blue dye.  Has recommended stopping her Prilosec.  Switched off cymbalta to Prozac and rash got MUCH better quickly.      Has rash that reoccurs under her breasts  Uses antifungal cream and gold bond powder    Has GERD that has been well controlled on Prilosec  Has reduced caffeine to two cups coffee per day (formerly over a pot of coffee daily)    I have reviewed the patient's medical, surgical, family and social history in detail today, 07/24/2012  and updated the computerized patient record as appropriate.    Outpatient Prescriptions Prior to Visit:  FLUoxetine (PROZAC) 20 mg Oral Tablet Active Take 1 Tab (20 mg total) by mouth Once a day Tablet necessary, allergy to dye in capsule.   buPROPion (BUPEPRION XL) 150 mg Oral Tablet Sustained Release 24 hr Active Take 1 Tab (150 mg total) by mouth Every morning   hydrOXYzine HCl (ATARAX) 25 mg Oral Tablet Active Take 1 Tab (25 mg total) by mouth Three times a day as needed for Itching   Clobetasol (TEMOVATE) 0.05 % Apply externally Ointment Active Apply topically Twice daily To rash on hands and feet   Minocycline (MINOCIN) 100 mg Oral Capsule Active Take 1 Cap (100 mg total) by mouth Once a day. Use for 10 days if need for recurrent boils   omeprazole (PRILOSEC) 20 mg Oral Capsule, Delayed Release(E.C.) Active take 1 Cap by mouth Once a day. Famotidine treatment failed   fluticasone  (FLONASE) 50 mcg/actuation Nasal Spray, Suspension Active 2 Sprays by Each Nostril route Once a day.   erythromycin (ROMYCIN) 5 mg/gram (0.5 %) Ophthalmic Ointment Active N/A   amoxicillin-pot clavulanate (AUGMENTIN) 875-125 mg Oral Tablet Discontinued Take 1 Tab by mouth Every 12 hours   Hydrocortisone-Oatmeal-Aloe-E (AVEENO) 1 % Apply externally Cream Discontinued by Apply externally route Twice daily   triamcinolone acetonide (ARISTOCORT A) 0.1 % Apply externally Ointment Discontinued by Apply Topically route Twice daily   Desoximetasone 0.25 % Apply externally Ointment Discontinued by Apply externally route Twice daily   naproxen (NAPROSYN) 375 mg Oral Tablet Active Take 1 Tab (375 mg total) by mouth Twice daily with food.   predniSONE (DELTASONE) 50 mg Oral Tablet Active take 1 Tab by mouth Once a day.   duloxetine (CYMBALTA) 60 mg Oral Capsule, Delayed Release(E.C.) Active take 1 Cap by mouth Once a day.   nystatin-triamcinolone (MYCOLOG II) 100,000-0.1 unit/g-% Apply externally Cream Discontinued Apply  topically Four times a day.   Halobetasol Propionate (ULTRAVATE) 0.05 % Apply externally Cream Discontinued Apply  topically. Three times a week   loratadine (CLARITIN) 10 mg Oral Tablet Active take 1 Tab by mouth Once a day.     No facility-administered medications prior to visit.  ROS: as per hpi  OBJECTIVE:   Vitals: BP 118/78  Pulse 84  Temp(Src) 36.6 C (97.8 F)  Ht 1.656 m (5' 5.2")  Wt 95.4 kg (210 lb 5.1 oz)  BMI 34.79 kg/m2   Appearance:in no apparent distress  Exam: Lungs: clear to auscultation bilaterally, normal percussion bilaterally  Heart: regular rate and rhythm, S1, S2 normal, no murmur, click, rub or gallop  Skin: rash on palms of hands has resolved    ASSESSMENT:   PLAN:  1. Candidiasis (112.9)   use monistat cream bid prn when rash occurs  Gave her nystatin powder to use when no rash as preventive measure     2. 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 Need for prophylactic vaccination and inoculation against influenza (V04.81)  INFLUENZA VACCINE  IM AGE 68 THROUGH ADULT (ADMIN)     4. GERD (530.81)  well controlled  Most other acid suppressors have blue dye so we elected to try diet alone, see if she can tolerate off Prilosec, can use Prilosec on and off     5. Multinodular goiter (241.1)  US THYROID     Return in about 6 months (around 01/21/2013) for annual wellness.  Advised to call back directly if there are further questions, or if these symptoms fail to improve as anticipated or worsen.  Theola Sequin, MD  [facility]  Operated by [facility]  [address]  [place] [address]  Dept: [phone]  Dept Fax: [phone]

## 2012-07-24 NOTE — Patient Instructions (Signed)
Diet for GERD or PUD Nutrition therapy can help ease the discomfort of gastroesophageal reflux disease (GERD) and peptic ulcer disease (PUD).  HOME CARE INSTRUCTIONS   Eat your meals slowly, in a relaxed setting.   Eat 5 to 6 small meals per day.   If a food causes distress, stop eating it for a period of time.  FOODS TO AVOID  Coffee, regular or decaffeinated.   Cola beverages, regular or low calorie.   Tea, regular or decaffeinated.   Pepper.   Cocoa.   High fat foods, including meats.   Butter, margarine, hydrogenated oil (trans fats).   Peppermint or spearmint (if you have GERD).   Fruits and vegetables if not tolerated.   Alcohol.   Nicotine (smoking or chewing). This is one of the most potent stimulants to acid production in the gastrointestinal tract.   Any food that seems to aggravate your condition.  If you have questions regarding your diet, ask your caregiver or a registered dietitian. TIPS  Lying flat may make symptoms worse. Keep the head of your bed raised 6 to 9 inches (15 to 23 cm) by using a foam wedge or blocks under the legs of the bed.   Do not lay down until 3 hours after eating a meal.   Daily physical activity may help reduce symptoms.  MAKE SURE YOU:   Understand these instructions.   Will watch your condition.   Will get help right away if you are not doing well or get worse.  Document Released: 10/11/2005 Document Revised: 09/30/2011 Document Reviewed: 08/27/2011 Exit[employer] Patient Information 2012 [employer]. 

## 2012-08-03 ENCOUNTER — Ambulatory Visit
Admission: RE | Admit: 2012-08-03 | Discharge: 2012-08-03 | Disposition: A | Discharge status: Home / Self Care | Payer: MEDICAID | Source: Ambulatory Visit | Attending: Family Medicine | Admitting: Family Medicine

## 2012-08-03 DIAGNOSIS — E042 Nontoxic multinodular goiter: Secondary | ICD-10-CM | POA: Insufficient documentation

## 2012-08-21 ENCOUNTER — Ambulatory Visit (INDEPENDENT_AMBULATORY_CARE_PROVIDER_SITE_OTHER): Payer: MEDICAID | Admitting: Psychiatry

## 2012-08-21 MED ORDER — FLUOXETINE 20 MG TABLET
20.0000 mg | ORAL_TABLET | Freq: Every day | ORAL | Status: DC
Start: 2012-08-21 — End: 2013-01-12

## 2012-08-21 MED ORDER — BUPROPION HCL XL 300 MG 24 HR TABLET, EXTENDED RELEASE
300.00 mg | ORAL_TABLET | Freq: Every morning | ORAL | Status: DC
Start: 2012-08-21 — End: 2013-01-12
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
## 2012-09-08 ENCOUNTER — Ambulatory Visit: Payer: MEDICAID | Attending: Dermatology | Admitting: Dermatology

## 2012-09-08 VITALS — BP 132/90 | Ht 65.2 in | Wt 212.1 lb

## 2012-09-08 DIAGNOSIS — N6459 Other signs and symptoms in breast: Secondary | ICD-10-CM | POA: Insufficient documentation

## 2012-09-08 DIAGNOSIS — L301 Dyshidrosis [pompholyx]: Secondary | ICD-10-CM | POA: Insufficient documentation

## 2012-09-08 MED ORDER — TRIAMCINOLONE ACETONIDE 0.1 % TOPICAL OINTMENT
TOPICAL_OINTMENT | Freq: Two times a day (BID) | CUTANEOUS | Status: DC
Start: 2012-09-08 — End: 2013-04-02

## 2012-09-08 NOTE — Progress Notes (Addendum)
Subjective:     Patient ID: [name] is a 56 y.o. female     Chief Complaint:     Chief Complaint   Patient presents with   . 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 [History of 1+ positive patch test to thimerosal and potassium dichromate.] She reports the rash started two weeks ago on her breasts with significant pruritus. She reports improvement of her rash when not wearing a bra. She started Wellbutrin a month ago. She started using Nystatin cream under her breasts about 2-3 weeks ago. She denies any other new medications. She reports taking oral Benadryl to help with pruritus. She has tried corn starch and OTC topical creams without help. She uses Extra laundry detergent and uses drier sheets. She uses unscented [employer]. She reports avoiding the allergens she tested positive on patch testing previously. She reports her hand eczema is doing better using topical Clobetasol and her feet eczema resolved after switching her shoes. No other skin-related complaints.    Review of Systems   Skin: Positive for itching and rash.     Current Outpatient Prescriptions   Medication Sig   . buPROPion (WELLBUTRIN XL) 300 mg Oral Tablet Sustained Release 24 hr Take 1 Tab (300 mg total) by mouth Every morning   . Clobetasol (TEMOVATE) 0.05 % Apply externally Ointment Apply topically Twice daily To rash on hands and feet   . erythromycin (ROMYCIN) 5 mg/gram (0.5 %) Ophthalmic Ointment N/A   . FLUoxetine (PROZAC) 20 mg Oral Tablet Take 1 Tab (20 mg total) by mouth Once a day Tablet necessary, allergy to dye in capsule.   . fluticasone (FLONASE) 50 mcg/actuation Nasal Spray, Suspension 2 Sprays by Each Nostril route Once a day.   Marland Kitchen Halobetasol Propionate (ULTRAVATE) 0.05 % Apply externally Cream Apply  topically. Three times a week    . hydrOXYzine HCl (ATARAX) 25 mg Oral Tablet Take 1 Tab (25 mg total) by mouth Three times a day as needed for Itching   . loratadine (CLARITIN) 10 mg Oral Tablet take 1 Tab by mouth Once a day.   . miconazole nitrate (MONISTAT) 2 % Vaginal Cream Apply to rash twice daily until resolves   . Minocycline (MINOCIN) 100 mg Oral Capsule Take 1 Cap (100 mg total) by mouth Once a day. Use for 10 days if need for recurrent boils   . naproxen (NAPROSYN) 375 mg Oral Tablet Take 1 Tab (375 mg total) by mouth Twice daily with food.   . nystatin (NYSTOP) 100,000 unit/gram Apply externally Powder Apply once or twice daily to PREVENT rash under breasts   . omeprazole (PRILOSEC) 20 mg Oral Capsule, Delayed Release(E.C.) take 1 Cap by mouth Once a day. Famotidine treatment failed   . predniSONE (DELTASONE) 50 mg Oral Tablet take 1 Tab by mouth Once a day.   . triamcinolone acetonide (ARISTOCORT A) 0.1 % Apply externally Ointment by Apply Topically route Twice daily     No current facility-administered medications for this visit.     Objective:   Marland Kitchen  Blood pressure 132/90, height 1.656 m (5' 5.2"), weight 96.2 kg (212 lb 1.3 oz).  Physical Exam   Constitutional: She appears well-developed and well-nourished. No distress.   Neurological: She is alert.   Skin: No  rash noted. She is not diaphoretic. No erythema. No pallor.          General skin exam not performed.    Assessment & Plan:     1. Rash (see #2 on body diagram): inflammatory, favor contact dermatitis, does not appear to be a drug rash  - Start wearing 100% cotton bras at this time. Wash all bras.  - Start Triamcinolone ointment 0.1% BID to the affected areas.  - Recommended switching to unscented products including laundry detergent, soaps, and moisturizers and avoidance of scented products such as drier sheets and fabric softeners.  - KOH examination of skin negative for fungi elements.    2. Dyshidrotic eczema (see #1 on body diagram): well-controlled   - Continue topical Clobetasol ointment daily as needed.    Return to clinic in 3 months or sooner if needed.    Albina Billet, MD      I saw and examined the patient.  I reviewed the resident's note.  I agree with the findings and plan of care as documented in the resident's note.  Any exceptions/additions are edited/noted.    Rudene Christians, MD 09/11/2012, 8:05 AM

## 2012-09-09 ENCOUNTER — Encounter (HOSPITAL_COMMUNITY): Payer: Self-pay | Admitting: Psychiatry

## 2012-10-03 ENCOUNTER — Emergency Department (EMERGENCY_DEPARTMENT_HOSPITAL): Payer: MEDICAID

## 2012-10-03 ENCOUNTER — Observation Stay (HOSPITAL_BASED_OUTPATIENT_CLINIC_OR_DEPARTMENT_OTHER): Payer: MEDICAID | Admitting: Emergency Medicine

## 2012-10-03 ENCOUNTER — Observation Stay
Admission: EM | Admit: 2012-10-03 | Discharge: 2012-10-04 | Disposition: A | Discharge status: Home / Self Care | Payer: MEDICAID | Attending: Family Medicine | Admitting: Family Medicine

## 2012-10-03 ENCOUNTER — Encounter (HOSPITAL_COMMUNITY): Payer: Self-pay

## 2012-10-03 DIAGNOSIS — K219 Gastro-esophageal reflux disease without esophagitis: Secondary | ICD-10-CM | POA: Insufficient documentation

## 2012-10-03 DIAGNOSIS — M25579 Pain in unspecified ankle and joints of unspecified foot: Secondary | ICD-10-CM | POA: Insufficient documentation

## 2012-10-03 DIAGNOSIS — Z91041 Radiographic dye allergy status: Secondary | ICD-10-CM | POA: Insufficient documentation

## 2012-10-03 DIAGNOSIS — Z8249 Family history of ischemic heart disease and other diseases of the circulatory system: Secondary | ICD-10-CM | POA: Insufficient documentation

## 2012-10-03 DIAGNOSIS — R072 Precordial pain: Principal | ICD-10-CM | POA: Insufficient documentation

## 2012-10-03 DIAGNOSIS — W010XXA Fall on same level from slipping, tripping and stumbling without subsequent striking against object, initial encounter: Secondary | ICD-10-CM | POA: Insufficient documentation

## 2012-10-03 DIAGNOSIS — F3289 Other specified depressive episodes: Secondary | ICD-10-CM | POA: Insufficient documentation

## 2012-10-03 DIAGNOSIS — F339 Major depressive disorder, recurrent, unspecified: Secondary | ICD-10-CM | POA: Diagnosis present

## 2012-10-03 DIAGNOSIS — Z87891 Personal history of nicotine dependence: Secondary | ICD-10-CM | POA: Insufficient documentation

## 2012-10-03 DIAGNOSIS — M25569 Pain in unspecified knee: Secondary | ICD-10-CM | POA: Insufficient documentation

## 2012-10-03 DIAGNOSIS — E042 Nontoxic multinodular goiter: Secondary | ICD-10-CM | POA: Insufficient documentation

## 2012-10-03 DIAGNOSIS — M25559 Pain in unspecified hip: Secondary | ICD-10-CM | POA: Insufficient documentation

## 2012-10-03 HISTORY — DX: Presence of spectacles and contact lenses: Z97.3

## 2012-10-03 LAB — CBC/DIFF
BASOPHILS: 1 %
BASOS ABS: 0.072 10*3/uL (ref 0.0–0.2)
EOS ABS: 0.128 10*3/uL (ref 0.0–0.5)
EOSINOPHIL: 1 %
HCT: 42.9 % (ref 33.5–45.2)
HGB: 14.6 g/dL (ref 11.2–15.2)
LYMPHOCYTES: 29 %
LYMPHS ABS: 2.706 10*3/uL (ref 1.0–4.8)
MCH: 29.3 pg (ref 27.4–33.0)
MCHC: 34 g/dL (ref 32.5–35.8)
MCV: 86.2 fL (ref 78–100)
MONOCYTES: 8 %
MONOS ABS: 0.687 10*3/uL (ref 0.3–1.0)
MPV: 7.4 fL — ABNORMAL LOW (ref 7.5–11.5)
PLATELET COUNT: 352 THOU/uL (ref 140–450)
PMN ABS: 5.604 10*3/uL (ref 1.5–7.7)
PMN'S: 61 %
RBC: 4.98 MIL/uL — ABNORMAL HIGH (ref 3.63–4.92)
RDW: 14.4 % (ref 12.0–15.0)
WBC: 9.2 10*3/uL (ref 3.5–11.0)

## 2012-10-03 MED ORDER — MORPHINE 4 MG/ML INJECTION SYRINGE
4.00 mg | INJECTION | INTRAMUSCULAR | Status: AC
Start: 2012-10-03 — End: 2012-10-03
  Administered 2012-10-03: 4 mg via INTRAMUSCULAR
  Filled 2012-10-03: qty 1

## 2012-10-03 MED ORDER — ONDANSETRON 4 MG DISINTEGRATING TABLET
4.0000 mg | ORAL_TABLET | Freq: Once | ORAL | Status: AC
Start: 2012-10-04 — End: 2012-10-03
  Administered 2012-10-03: 4 mg via SUBLINGUAL
  Filled 2012-10-03: qty 1

## 2012-10-03 NOTE — ED Nurses Note (Signed)
Patient to xray.

## 2012-10-03 NOTE — ED Nurses Note (Signed)
2328: EKG completed.

## 2012-10-03 NOTE — ED Nurses Note (Signed)
2300: Received verbal pt report from Lequita Halt, RN.

## 2012-10-03 NOTE — ED Attending Note (Signed)
Note begun by:  Serina Cowper, MD 10/03/2012, 11:48 PM    I was physically present and directly supervised this patient's care.  Patient seen and examined with Dr Marylene Land.  Resident  history and exam reviewed.   Key elements in addition to and/or correction of that documentation are as follows:    HPI :    56 y.o. female presents with chief complaint of fall on ice today with attendant LLE pain. Now with pain in pelvis, L hip and knee. No numbness or tingling.     PE :   VS on presentation: Blood pressure 146/98, pulse 82, temperature 36.8 C (98.2 F), resp. rate 18, height 1.676 m (5' 5.98"), weight 95.255 kg (210 lb), SpO2 99.00%.  I have seen and examined with Dr Marylene Land and agree with his exam.       Data/Test :    EKG : see ECG  Images Review by me : see list  Image Reports Review by me : As above  Labs : see list    Review of Prior Data :       Prior Images : None  Prior EKG : None  Online Medical Records : [facility]  Transfer Docs/Images : None    Clinical Impression :   1. Fall on ice.   2. LLE pain.           ED Course :   Per Dr Marylene Land     Plan :   Per Dr Marylene Land     Dispo :   Per Dr Marylene Land    CRITICAL CARE : None

## 2012-10-03 NOTE — ED Nurses Note (Signed)
2340: Pt complaining of chest pain. Denies SOB, N/V. Rates as 6/10. Resident aware. Placed on monitor. Placed 20g piv in left [facility], labs drawn and sent.

## 2012-10-04 ENCOUNTER — Encounter (HOSPITAL_COMMUNITY): Payer: Self-pay | Admitting: Family Medicine

## 2012-10-04 ENCOUNTER — Other Ambulatory Visit (HOSPITAL_COMMUNITY): Payer: Self-pay

## 2012-10-04 ENCOUNTER — Emergency Department (EMERGENCY_DEPARTMENT_HOSPITAL): Payer: MEDICAID

## 2012-10-04 ENCOUNTER — Ambulatory Visit (HOSPITAL_COMMUNITY): Payer: Self-pay

## 2012-10-04 DIAGNOSIS — M25572 Pain in left ankle and joints of left foot: Secondary | ICD-10-CM

## 2012-10-04 LAB — BASIC METABOLIC PANEL
ANION GAP: 9 mmol/L (ref 5–16)
BUN/CREAT RATIO: 16 (ref 6–22)
BUN: 16 mg/dL (ref 6–20)
CALCIUM: 9.2 mg/dL (ref 8.5–10.4)
CARBON DIOXIDE: 24 mmol/L (ref 22–32)
CHLORIDE: 105 mmol/L (ref 96–111)
CREATININE: 0.99 mg/dL (ref 0.49–1.10)
ESTIMATED GLOMERULAR FILTRATION RATE: 58 mL/min/{1.73_m2} — ABNORMAL LOW (ref >59)
POTASSIUM: 4.1 mmol/L (ref 3.5–5.1)
SODIUM: 138 mmol/L (ref 136–145)

## 2012-10-04 LAB — LIPID PANEL
CHOLESTEROL: 184 mg/dL (ref <200)
HDL-CHOLESTEROL: 36 mg/dL — ABNORMAL LOW (ref >39)
LDL (CALCULATED): 122 mg/dL — ABNORMAL HIGH (ref <100)
NON - HDL (CALCULATED): 148 mg/dL (ref <190)
TRIGLYCERIDES: 131 mg/dL (ref <150)
VLDL (CALCULATED): 26 mg/dL (ref <30)

## 2012-10-04 LAB — TROPONIN-I (FOR ED ONLY): TROPONIN-I: 0.018 ng/mL (ref <0.030)

## 2012-10-04 LAB — THYROID STIMULATING HORMONE WITH FREE T4 REFLEX: THYROID STIMULATING HORMONE WITH FREE T4 REFLEX: 1.646 u[IU]/mL (ref 0.300–5.900)

## 2012-10-04 LAB — TROPONIN-I: TROPONIN-I: 0.011 ng/mL (ref <0.030)

## 2012-10-04 MED ORDER — MORPHINE 4 MG/ML INJECTION SYRINGE
INJECTION | INTRAMUSCULAR | Status: AC
Start: 2012-10-04 — End: 2012-10-04
  Administered 2012-10-04: 4 mg via INTRAVENOUS
  Filled 2012-10-04: qty 1

## 2012-10-04 MED ORDER — FLUOXETINE 20 MG CAPSULE
20.00 mg | ORAL_CAPSULE | Freq: Every evening | ORAL | Status: DC
Start: 2012-10-04 — End: 2012-10-04
  Administered 2012-10-04: 20 mg via ORAL
  Filled 2012-10-04 (×2): qty 1

## 2012-10-04 MED ORDER — DEXTROSE 5 % AND 0.45 % SODIUM CHLORIDE INTRAVENOUS SOLUTION
INTRAVENOUS | Status: DC
Start: 2012-10-04 — End: 2012-10-04

## 2012-10-04 MED ORDER — HYDROCODONE 5 MG-ACETAMINOPHEN 325 MG TABLET
1.0000 | ORAL_TABLET | Freq: Four times a day (QID) | ORAL | Status: DC | PRN
Start: 2012-10-04 — End: 2012-10-04
  Administered 2012-10-04: 1 via ORAL
  Filled 2012-10-04: qty 1

## 2012-10-04 MED ORDER — BUPROPION HCL SR 150 MG TABLET,12 HR SUSTAINED-RELEASE
150.00 mg | ORAL_TABLET | Freq: Two times a day (BID) | ORAL | Status: DC
Start: 2012-10-04 — End: 2012-10-04
  Administered 2012-10-04: 150 mg via ORAL
  Filled 2012-10-04 (×2): qty 1

## 2012-10-04 MED ORDER — TRIAMCINOLONE ACETONIDE 0.1 % TOPICAL OINTMENT
TOPICAL_OINTMENT | Freq: Two times a day (BID) | CUTANEOUS | Status: DC
Start: 2012-10-04 — End: 2012-10-04
  Filled 2012-10-04: qty 15

## 2012-10-04 MED ORDER — ASPIRIN 81 MG CHEWABLE TABLET
324.00 mg | CHEWABLE_TABLET | ORAL | Status: AC
Start: 2012-10-04 — End: 2012-10-04
  Administered 2012-10-04: 324 mg via ORAL
  Filled 2012-10-04: qty 4

## 2012-10-04 MED ORDER — ENOXAPARIN 40 MG/0.4 ML SUBCUTANEOUS SYRINGE
40.0000 mg | INJECTION | SUBCUTANEOUS | Status: DC
Start: 2012-10-05 — End: 2012-10-04

## 2012-10-04 MED ORDER — SODIUM CHLORIDE 0.9 % (FLUSH) INJECTION SYRINGE
2.0000 mL | INJECTION | INTRAMUSCULAR | Status: DC | PRN
Start: 2012-10-04 — End: 2012-10-04

## 2012-10-04 MED ORDER — KETOROLAC 15 MG/ML INJECTION SOLUTION
15.00 mg | Freq: Four times a day (QID) | INTRAMUSCULAR | Status: DC | PRN
Start: 2012-10-04 — End: 2012-10-04
  Administered 2012-10-04: 15 mg via INTRAVENOUS
  Filled 2012-10-04 (×2): qty 1

## 2012-10-04 MED ORDER — FLUTICASONE PROPIONATE 50 MCG/ACTUATION NASAL SPRAY,SUSPENSION
2.0000 | Freq: Every day | NASAL | Status: DC
Start: 2012-10-04 — End: 2012-10-04
  Administered 2012-10-04: 2 via NASAL
  Filled 2012-10-04: qty 16

## 2012-10-04 MED ORDER — MORPHINE 4 MG/ML INJECTION SYRINGE
4.00 mg | INJECTION | INTRAMUSCULAR | Status: AC
Start: 2012-10-04 — End: 2012-10-04

## 2012-10-04 MED ORDER — SODIUM CHLORIDE 0.9 % (FLUSH) INJECTION SYRINGE
2.0000 mL | INJECTION | Freq: Three times a day (TID) | INTRAMUSCULAR | Status: DC
Start: 2012-10-04 — End: 2012-10-04
  Administered 2012-10-04 (×2): 2 mL

## 2012-10-04 MED ORDER — NAPROXEN 375 MG TABLET
375.00 mg | ORAL_TABLET | Freq: Two times a day (BID) | ORAL | Status: DC
Start: 2012-10-04 — End: 2012-10-11

## 2012-10-04 MED ORDER — ENOXAPARIN 30 MG/0.3 ML SUBCUTANEOUS SYRINGE
30.0000 mg | INJECTION | Freq: Two times a day (BID) | SUBCUTANEOUS | Status: DC
Start: 2012-10-04 — End: 2012-10-04

## 2012-10-04 NOTE — Nurses Notes (Signed)
Pt arrived from ED to [phone].  Pt fell on ice and hurt left ankle, knee and leg.  Pt skin WNL, pt has no shortness of breath at this time on Room air.[place] Dec charge nurse discussed falls with patient and she understands to call if getting out of bed.  Pt has 20 ga in Left AC.  Will monitor pt for changes.

## 2012-10-04 NOTE — H&P (Addendum)
[facility]   Family Medicine   History and Physical    Barbara, Gardner, 56 y.o. female  Date of Admission:  10/03/2012  Date of Birth:  1956/03/09    Information Obtained from: patient  Chief Complaint:  Chest pain    HPI: [name] is a 56 y.o., White female with PMH significant for GERD, Depression who reported to the ED with left knee and ankle pain after a fall at home yesterday afternoon. In the ED, patient complained of sudden-onset severe, stabbing, sub-sternal pain, rated 10/10 initially associated with SOB, a flushed sensation in her face. The pain lasted about 20 minutes, resolved mostly after a dose of Morphine given. Patient denied any prior episodes of similar nature.  She lives in [place], was walking [place] towards her car when she slipped on ice and hurt her hips and left leg.   In the ED, XRs of hips, left knee, left knee, left ankle were negative for acute fractures while a questionable translucency in lateral view was noted at Fibular neck.  No other complaints/ concerns at this time.  She reports to be compliant with all her medications. Currently taking Wellbutrin and Prozac daily, "weaned off' of Prilosec about 2 months ago because of "Blue eye" allergy.  Cardiac risk factors: Smoking history 80 PY, Family history of premature cardiac death ( Father had MI at age 11)    Past Medical History   Diagnosis Date   . depression    . Multinodular goiter    . Headache      Migraines   . Arthritis    . Vaginal prolapse    . Rheumatic fever 16     no complications   . Depression    . GERD (gastroesophageal reflux disease)      controlled w/med     Past Surgical History   Procedure Laterality Date   . Pb upper gi endoscopy,biopsy  12/19/07     patulous GE junction zone, erythema, nonerosive GERD   . Hx ankle fracture tx  2007     left distal fibula, casted   . Hx wrist fracture tx  2007     FOOSH injury   . Hx partial hysterectomy     . Colonoscopy  01/06/09      COLONOSCOPY performed by Augustin Schooling F at [facility] OR ENDO   . Hx cystocele repair  09/24/09   . Hx gall bladder surgery/chole     . Hx adenoidectomy     . Hx tonsillectomy     . Gastroscopy  03/05/2010     GASTROSCOPY performed by Knox Saliva at [facility] OR ENDO   . Gastroscopy with biopsy  03/05/2010     GASTROSCOPY WITH BIOPSY performed by Knox Saliva at [facility] OR ENDO   . Pb revise ulnar nerve at elbow  1979     Medications Prior to Admission    Outpatient Medications    buPROPion (WELLBUTRIN XL) 300 mg Oral Tablet Sustained Release 24 hr    Take 1 Tab (300 mg total) by mouth Every morning    Clobetasol (TEMOVATE) 0.05 % Apply externally Ointment    Apply topically Twice daily To rash on hands and feet    erythromycin (ROMYCIN) 5 mg/gram (0.5 %) Ophthalmic Ointment    N/A    FLUoxetine (PROZAC) 20 mg Oral Tablet    Take 1 Tab (20 mg total) by mouth Once a day Tablet necessary, allergy to dye in capsule.  fluticasone (FLONASE) 50 mcg/actuation Nasal Spray, Suspension    2 Sprays by Each Nostril route Once a day.    Halobetasol Propionate (ULTRAVATE) 0.05 % Apply externally Cream    Apply  topically. Three times a week    hydrOXYzine HCl (ATARAX) 25 mg Oral Tablet    Take 1 Tab (25 mg total) by mouth Three times a day as needed for Itching    loratadine (CLARITIN) 10 mg Oral Tablet    take 1 Tab by mouth Once a day.    miconazole nitrate (MONISTAT) 2 % Vaginal Cream    Apply to rash twice daily until resolves    Minocycline (MINOCIN) 100 mg Oral Capsule    Take 1 Cap (100 mg total) by mouth Once a day. Use for 10 days if need for recurrent boils    naproxen (NAPROSYN) 375 mg Oral Tablet    Take 1 Tab (375 mg total) by mouth Twice daily with food.    nystatin (NYSTOP) 100,000 unit/gram Apply externally Powder    Apply once or twice daily to PREVENT rash under breasts    omeprazole (PRILOSEC) 20 mg Oral Capsule, Delayed Release(E.C.)    take 1 Cap by mouth Once a day. Famotidine treatment failed     predniSONE (DELTASONE) 50 mg Oral Tablet    take 1 Tab by mouth Once a day.    triamcinolone acetonide (ARISTOCORT A) 0.1 % Apply externally Ointment    by Apply Topically route Twice daily        Allergies   Allergen Reactions   . Cymbalta (Duloxetine)      Rash on feet and hands   . Blue Dye      Told to avoid due to patch test result by Abundio Miu   . Codeine Itching     RASH   . Mobic (Meloxicam) Nausea/ Vomiting   . Nickel      History   Substance Use Topics   . Smoking status: Former Smoker -- 2.00 packs/day for 40 years     Types: Cigarettes     Quit date: 10/14/2005   . Smokeless tobacco: Never Used      Comment: quit 2007   . Alcohol Use: Yes      Comment: 1 glass wine/month     Family History   Problem Relation Age of Onset   . Congestive Heart Failure Father    . Heart Attack Sister      43   . Hypertension Brother    . Hypertension Sister    . Hypertension Sister    . Kidney Disease Sister      ESRD   . Stroke Paternal Grandmother    . Diabetes Mother    . Thyroid Disease Sister    . High Cholesterol Mother    . Hypertension Mother    . Coronary Artery Disease Mother 12     CABG   . Bipolar Disorder Daughter    . Cancer Paternal Aunt      Breast Ca   . Cancer Paternal Aunt      Breast Ca   . Cancer Paternal Aunt      Breast Ca   . Cancer Paternal Aunt      Breast Ca   . Cancer Paternal Aunt      Breast Ca   . Cancer Paternal Uncle      Leukemia   . Cancer Other 56     Breast Ca   . Cancer  Other 60     Breast Ca       ROS:   Review of Systems   Constitutional: Negative for fever and chills.   HENT: Negative for congestion, sore throat and neck pain.    Eyes: Negative for blurred vision.   Respiratory: Negative for cough, sputum production and shortness of breath.    Cardiovascular: Negative for palpitations and leg swelling.   Gastrointestinal: Negative for heartburn, nausea, vomiting, abdominal pain, diarrhea and constipation.   Genitourinary: Negative for dysuria, urgency and hematuria.    Musculoskeletal: Positive for left knee and ankle pain.  Skin: Negative for rash.   Neurological: Negative for dizziness, tingling and headaches.       EXAM:  Temperature: 36.8 C (98.2 F)  Heart Rate: 68  BP (Non-Invasive): 121/69 mmHg  Respiratory Rate: 11  SpO2-1: 97 %  Pain Score (Numeric, Faces): 6  General: appears in good health and no distress  Eyes: Conjunctiva clear., Pupils equal and round.   HENT:Head atraumatic and normocephalic, ENT without erythema or injection, mucouse membranes moist.  Neck: No JVD or thyromegaly or lymphadenopathy  Lungs: Clear to auscultation bilaterally.   Cardiovascular: regular rate and rhythm  Abdomen: Soft, non-tender, Bowel sounds normal  Extremities: Moderate to severe tenderness with swelling around left knee. No obvious deformity. Distal extremity neuro-vascularity intact. Unable to flex at left knee, dorsi-flexion of ankle is painful.  Skin: Skin warm and dry  Neurologic: Grossly normal    LABS:    I have reviewed all lab results.  Lab Results for Last 24 Hours:    Results for orders placed during the hospital encounter of 10/03/12 (from the past 24 hour(s))   TROPONIN-I (FOR ED ONLY)       Result Value Range    TROPONIN-I 0.018  <0.030 ng/mL   CBC/DIFF       Result Value Range    WBC 9.2  3.5 - 11.0 THOU/uL    RBC 4.98 (*) 3.63 - 4.92 MIL/uL    HGB 14.6  11.2 - 15.2 g/dL    HCT 16.1  09.6 - 04.5 %    MCV 86.2  78 - 100 fL    MCH 29.3  27.4 - 33.0 pg    MCHC 34.0  32.5 - 35.8 g/dL    RDW 40.9  81.1 - 91.4 %    PLATELET COUNT 352  140 - 450 THOU/uL    MPV 7.4 (*) 7.5 - 11.5 fL    PMN'S 61      PMN ABS 5.604  1.5 - 7.7 THOU/uL    LYMPHOCYTES 29      LYMPHS ABS 2.706  1.0 - 4.8 THOU/uL    MONOCYTES 8      MONOS ABS 0.687  0.3 - 1.0 THOU/uL    EOSINOPHIL 1      EOS ABS 0.128  0.0 - 0.5 THOU/uL    BASOPHILS 1      BASOS ABS 0.072  0.0 - 0.2 THOU/uL   BASIC METABOLIC PANEL, NON-FASTING       Result Value Range    SODIUM 138  136 - 145 mmol/L     POTASSIUM 4.1  3.5 - 5.1 mmol/L    CHLORIDE 105  96 - 111 mmol/L    CARBON DIOXIDE 24  22 - 32 mmol/L    ANION GAP 9  5 - 16 mmol/L    CREATININE 0.99  0.49 - 1.10 mg/dL    ESTIMATED GLOMERULAR FILTRATION RATE 58 (*) >  59 ml/min/1.84m2    GLUCOSE,NONFAST 100  65 - 139 mg/dL    BUN 16  6 - 20 mg/dL    BUN/CREAT RATIO 16  6 - 22    CALCIUM 9.2  8.5 - 10.4 mg/dL       Radiology Results:   XR Hips- Negative for fractures.  XR Knee L: Old, medial degenerative changes. No fractures.  XR Ankle L: Diffuse soft-tissue swelling, no fractures.  XR Femur: No acute fractures.  XR Tibia-Fibula: 2 views of the left tibia and fibula show no definite evidence for acute fracture or dislocation. On the lateral view there is an oblique lucency through the fibular neck which may be artifactual, as it is only seen on the single image and not seen on the frontal view or on the knee examination. Correlation with point tenderness at the fibular head and neck is suggested to exclude fracture.  XR Chest: No acute process noted.    ECG: No acute ST-T changes.    IF APPROPRIATE, HAS PATIENT RECEIVED PNEUMOVAX No  IF APPROPRIATE, HAS PATIENT RECEIVED  INFLUENZA Yes    DNR Status:  Prior    ASSESSMENT/PLAN:  Active Hospital Problems   (*Primary Problem)    Diagnosis   . *Chest pain at rest     Admit to [facility]  Attending Dr. Brooke Dare  R/O ACS with Troponin x 3, ECG WNL  Will consider MPS (Dobutamine induced stress given immobility) vs O/P MPS  Lipid panel ordered         . Left ankle pain     Post-traumatic pain  Likely soft-tissue injury  Will consider PT/ OT vs acute pain management and O/P FU     . Left knee pain     Post-traumatic pain  Likely soft-tissue injury  Will consider PT/ OT vs acute pain management and O/P FU     . Multinodular goiter     Chronic      [place]  9/11--no change from 2009  Last TSH in 10/2010- 1.02  Will repeat given history     . Depression     Chronic     Dx < 5 years ago   Currently stable on Prozac 20 mg, Wellbutrin 300 mg XR daily     . GERD not well controlled     Chronic     EGD done by Dr Alto Denver 12/19/07 nonerosive GERD, erythema and patulous GE junction, continue PPI and antireflux measures.    Esophageal manometry 01/03/08 essentially normal.   PH probe testing--abnormal off PPI  Recently discontinued Prilosec, currently stable with lifestyle changes.       DVT Risk Factors:  Age greater than 40 and Obesity  DVT/PE Prophylaxis: Lovenox    Guilford Shi, MD 10/04/2012, 1:38 AM    ATTENDING NOTE    I saw and examined the patient.  I reviewed the resident's note.  I agree with the findings and plan of care as documented in the resident's note.  Any exceptions/additions are edited/noted.  The patient has pain out of proportion to her findings. Her troponons are negative x3, and she has no c/o chest pain this morning.     Thora Lance, MD 10/04/2012, 10:57 AM

## 2012-10-04 NOTE — Care Plan (Signed)
Problem: General Plan of Care(Adult,OB)  Goal: Plan of Care Review(Adult,OB)  The patient and/or their representative will communicate an understanding of their plan of care   Outcome: Ongoing (see interventions/notes)  Discharge Plan:  Home(Patient/Family Member/other) (code 1)  Pt admitted for chest pain in observation, pt to have chest pain work up, testing monitoring, cardiac enzymes, r/o for DC to home, met with pt voiced concerns with pain and getting OOB she felt she needed crutches because her initial reason for coming to the ER was for falling on the ice. PT was ordered and DC'd spoke to [facility] and asked them to place a new order and called PT/OT Vikki Ports to be assigned and made them aware pt may DC to home tonight. RN made aware as well of plan, pt given obs letter states she can have transportation provided by her daughter who works here, no other DC needs identified if pt would need crutched PT can pride them.

## 2012-10-04 NOTE — Care Management Notes (Addendum)
Placed order for FWW after Speaking to Physical Therapist Leanord Asal. Pt gave choice for [facility] to be delivered to the room. Will have Pts walker delivered to the room today for DC. PT also gave pt ace wrap placed on left knee and aircast for ankle. Pts daughter works here and will provide transportation for DC after 530pm. RN Dahlia Client made aware pf plan. DC 10-04-12 to home with FWW crutches.

## 2012-10-04 NOTE — Nurses Notes (Signed)
Pt has dermatitis under right and left breast reaction from detergent.

## 2012-10-04 NOTE — ED Nurses Note (Signed)
69: Verbal pt report given to RN on 8W. Pt will be transported on telepack.

## 2012-10-04 NOTE — PT Treatment (Signed)
[facility]  Physical Therapy Initial Evaluation    Patient Name: [name]  Date of Birth: 01/28/1956  Height:  167.6 cm (5' 5.98")  Weight: 95.6 kg (210 lb 12.2 oz)  Room/Bed: 826/A  Payor: [place] MEDICAID  Plan: [facility] [place] MEDICAID  Product Type: Medicaid       Date/Time of Admission: 10/03/2012 10:01 PM  Admitting Diagnosis:  There are no admission diagnoses documented for this encounter.      HPI:     [name] is a 56 y.o. female sp fall on ice;  No fxs noted in L knee or ankle; oblique lunacy noted L fibular head that may need further evaluation as per medical team. Dx : soft tissue injury of L knee and ankle with swelling and 8/10 pain.  Precautions: fall     Past Medical History   Diagnosis Date   . depression    . Multinodular goiter    . Headache      Migraines   . Arthritis    . Vaginal prolapse    . Rheumatic fever 16     no complications   . Depression    . GERD (gastroesophageal reflux disease)      controlled w/med   . Wears glasses    . Wears dentures    . Contact dermatitis and other eczema, due to unspecified cause      allergic reaction to detergent     Past Surgical History   Procedure Laterality Date   . Pb upper gi endoscopy,biopsy  12/19/07     patulous GE junction zone, erythema, nonerosive GERD   . Hx ankle fracture tx  2007     left distal fibula, casted   . Hx wrist fracture tx  2007     FOOSH injury   . Hx partial hysterectomy     . Colonoscopy  01/06/09     COLONOSCOPY performed by Augustin Schooling F at [facility] OR ENDO   . Hx cystocele repair  09/24/09   . Hx gall bladder surgery/chole     . Hx adenoidectomy     . Hx tonsillectomy     . Gastroscopy  03/05/2010     GASTROSCOPY performed by Knox Saliva at [facility] OR ENDO   . Gastroscopy with biopsy  03/05/2010     GASTROSCOPY WITH BIOPSY performed by Knox Saliva at [facility] OR ENDO   . Pb revise ulnar nerve at elbow  1979       reports that she quit smoking about 6 years ago. Her smoking use included Cigarettes. She has a 80 pack-year smoking history. She has never used smokeless tobacco. She reports that  drinks alcohol. She reports that she does not use illicit drugs.  History   Smoking status   . Former Smoker -- 2.00 packs/day for 40 years   . Types: Cigarettes   . Quit date: 10/14/2005   Smokeless tobacco   . Never Used     Comment: quit 2007         Subjective:     Complained of pain L knee and ankle with movement and palpation and weightbearing    Patient Goals: go home      Objective:     Cognition: Alert, Awake, Cooperative and Oriented Person, Place and Time  Follows Directions: Simple  and Complex    ROM: WFL all extremities except L ankle and knee decreased due to swelling and pain  Strength: [employer]  All extremities except L LE limited by pain    Bed Mobility: supine to sit supervision  Transfers: sit to stand supervision  Balance: Sitting Dynamic Good and Static Good Standing Dynamic Good and Static Good with walker; unable to ambulate without a walker  Ambulation:Distance: 50 feet , using rolling/front wheeled walker and Level of Assistance supervision  Stair traininig: instructed patient on proper crutch use and practice using crutches to climb 4 steps and descend 4 steps.; required Contact guard assist for stairs  Posture: WNL  Wrapped L knee with ACE bandage support and compression;   Instructed patient on ice treatment : 20 min on knee; 20 min on ankle; 20 mn off  Elevation and rest recommended  Gentle Rom of knee and ankle       Assessment:   Safe for normal ambulation with front wheel walker independently; safe for up and down stairs with one crutch and one handrail with assist of family    Rehab Potential: Good    Problem List: Decreased Ambulation, Decreased Balance, Decreased Strength, Pain , Safety and decreased funtion  Barriers to Discharge: none      Goals:   Ambulated with walker independently   Ascend stair with crutch and handrail with assist      Discharge Needs:   Equipment Recommendations: Front [employer]    The patient presents with mobility limitations due to impaired balance, impaired range of motion, impaired strength and impaired functional activity tolerance that significantly impair/prevent patient's ability to participate in mobility-related activities of daily living (MRADLs) including  ambulation and transfers in order to safely complete, toileting, bathing, working, safely entering/exiting the home, in reasonable time. This functional mobility deficit can be sufficiently resolved with the use of a  front wheel walker in order to decrease the risk of falls, morbidity, and mortality in performance of these MRADLs.  Patient is able to safely use this assistive device.  Further Treatment Recommendations: No Treatment Indicated    Plan:   Current Intervention:Mobility Training   To provide physical therapy services 1 times/day for 1days/week until discontinued.    The risks/benefits of therapy have been discussed with the patient/caregiver and he/she is in agreement with the established plan of care.     Therapist:   Tawanna Solo, PT 10/04/2012 3:55 PM  Pager #: [phone]  Evaluation Time: 45 minutes  Time may include review of medical chart, obtaining patient's functional history from patient/family/medical staff/case management/ancillary personnel, collaboration on findings and treatment options (with the above mentioned individuals), re-assessment, and acute care rehabilitation.

## 2012-10-04 NOTE — Nurses Notes (Signed)
Patient adequate for discharge.  Reviewed discharge instructions with patient, patient verbalized understanding and denies any questions or concerns at this time.  Service in room educating patient.  Peripheral IV removed, catheter intact.  Patient exited floor in wheel chair via transport.

## 2012-10-04 NOTE — ED Nurses Note (Signed)
6045: Family medicine consult at bedside.

## 2012-10-05 NOTE — Care Management Notes (Signed)
Referral Information  ++++++ Placed Provider #1 ++++++  Case Manager: Andriana Casa  Provider Type: DME  Provider Name: [facility]  Address:  [address]  [place], [place] [address]  Contact:    Fax:   Fax:

## 2012-10-09 ENCOUNTER — Encounter (INDEPENDENT_AMBULATORY_CARE_PROVIDER_SITE_OTHER): Payer: Self-pay

## 2012-10-09 ENCOUNTER — Other Ambulatory Visit (INDEPENDENT_AMBULATORY_CARE_PROVIDER_SITE_OTHER): Payer: MEDICAID | Admitting: Family Medicine

## 2012-10-10 ENCOUNTER — Other Ambulatory Visit (INDEPENDENT_AMBULATORY_CARE_PROVIDER_SITE_OTHER): Payer: Self-pay

## 2012-10-10 ENCOUNTER — Telehealth (INDEPENDENT_AMBULATORY_CARE_PROVIDER_SITE_OTHER): Payer: Self-pay

## 2012-10-10 NOTE — Telephone Encounter (Signed)
Spoke to patient in regards to recent hospital discharge.  Patient rates her pain an 8 on a scale of 1 - 10.  Unable to take naprosyn due to GI upset.   Patient interested in changing pain medicine to Voltaren if you could please send to the pharmacy.  I can call the patient if you are willing to do that please.    Alvis Lemmings, RN 10/10/2012, 10:17 AM

## 2012-10-10 NOTE — Progress Notes (Signed)
Previsit phone call made for upcoming appointment in [facility] Medicine Discharge Clinic.  Recent admission for fall and chest pain.  Patient had a fall on the ice and came in for evaluation due to left leg, knee, and ankle pain.  While in the ED, patient developed chest pain.  CXR and Troponins were negative.      Patient denies any further episodes of chest pain since discharge, but she does rate her pain an 8 on a scale of 1-10.  She is not able to take the Naprosyn due to GI upset.  Ibuprofen does not work.  She is using ice.  Patient is using wheeled walker for mobility.      MPS appointment scheduled for 12/19 and sports med appointment 12/20.        Patient reminded of the date(s) and time(s) of upcoming appointments.  Messaged Dr. Thad Ranger asking if we can change pain medicine.  Awaiting a response and will notify patient if something is called in.    Patient encouraged to call the office for any issues.  Call 911 for any emergencies.  Instruction provided to call hospital and ask for family medicine doctor on call for any issues after hours.    Alvis Lemmings, RN 10/10/2012, 10:18 AM

## 2012-10-11 ENCOUNTER — Encounter (INDEPENDENT_AMBULATORY_CARE_PROVIDER_SITE_OTHER): Payer: Self-pay | Admitting: Family Medicine

## 2012-10-11 ENCOUNTER — Ambulatory Visit: Payer: MEDICAID | Attending: Family Medicine | Admitting: Family Medicine

## 2012-10-11 ENCOUNTER — Other Ambulatory Visit (INDEPENDENT_AMBULATORY_CARE_PROVIDER_SITE_OTHER): Payer: Self-pay

## 2012-10-11 ENCOUNTER — Encounter (INDEPENDENT_AMBULATORY_CARE_PROVIDER_SITE_OTHER): Payer: Self-pay

## 2012-10-11 VITALS — BP 112/74 | HR 76 | Temp 97.6°F | Wt 215.2 lb

## 2012-10-11 DIAGNOSIS — Z0000 Encounter for general adult medical examination without abnormal findings: Secondary | ICD-10-CM | POA: Insufficient documentation

## 2012-10-11 DIAGNOSIS — Z9181 History of falling: Secondary | ICD-10-CM | POA: Insufficient documentation

## 2012-10-11 DIAGNOSIS — IMO0002 Reserved for concepts with insufficient information to code with codable children: Secondary | ICD-10-CM | POA: Insufficient documentation

## 2012-10-11 MED ORDER — CELECOXIB 200 MG CAPSULE
200.00 mg | ORAL_CAPSULE | Freq: Two times a day (BID) | ORAL | Status: DC
Start: 2012-10-11 — End: 2012-10-24

## 2012-10-11 NOTE — Progress Notes (Signed)
 Patient Name: [name]  MRN# [id number]  DOB: Nov 27, 1955  Date of Service: 10/11/2012    [name] 56 y.o. female was seen by case management during discharge clinic appointment after a recent hospitalization for chest pain and s/p fall.   Now, complaining of left hip, knee, and ankle pain.  Verbal consent given for care management services which may include follow up phone calls after discharge, care manager to attend PCP appointments, and coordination of care with PCP.  Business card given with office phone number.      [name] reports that she has the leg pain rating an 8 on a scale of 1 to 10.  Unable to take naprosyn  or diclofenac .  Using walker to assist with mobility.   Denies any episodes of chest pain.       Home Health services: no     Psychologist consulted:  No.    Dietary consulted:  no    Labs drawn:   no    Medications reconciled: Yes    History   Smoking status   . Former Smoker -- 2.00 packs/day for 40 years   . Types: Cigarettes   . Quit date: 10/14/2005   Smokeless tobacco   . Never Used     Comment: quit 2007         Celebrex  prescribed for pain.   MPS scheduled for 12/19.   Sports medicine appointment 12/20.  Patient reminded of upcoming appointments and times.  Care management to follow up with the patient in 7 to 10 days.  Patient encouraged to call care manager with any concerns.   Call 911 for emergencies.       Barbara Ling, RN 10/11/2012, 2:44 PM

## 2012-10-11 NOTE — Progress Notes (Signed)
 Celebrex  not covered by insurance.  Patient has tried multiple medications without success.  Prior authorization form faxed by [facility] and given to Dr. Rexann to fill out and fax back.  Asked patient to check with the pharmacy in a couple of hours.     Olam Ling, RN 10/11/2012, 3:54 PM

## 2012-10-11 NOTE — Progress Notes (Signed)
I saw and examined the patient.  I reviewed the resident's note.  I agree with the findings and plan of care as documented in the resident's note.  Any exceptions/additions are edited/noted.    [name], DO 10/11/2012, 1:22 PM

## 2012-10-11 NOTE — Progress Notes (Signed)
cmn faxed to [facility] for a walker

## 2012-10-12 ENCOUNTER — Ambulatory Visit
Admission: RE | Admit: 2012-10-12 | Discharge: 2012-10-12 | Disposition: A | Discharge status: Home / Self Care | Payer: MEDICAID | Source: Ambulatory Visit | Attending: Family Medicine | Admitting: Family Medicine

## 2012-10-12 ENCOUNTER — Ambulatory Visit (HOSPITAL_BASED_OUTPATIENT_CLINIC_OR_DEPARTMENT_OTHER)
Admission: RE | Admit: 2012-10-12 | Discharge: 2012-10-12 | Disposition: A | Discharge status: Home / Self Care | Payer: MEDICAID | Source: Ambulatory Visit | Attending: Family Medicine | Admitting: Family Medicine

## 2012-10-12 ENCOUNTER — Telehealth (INDEPENDENT_AMBULATORY_CARE_PROVIDER_SITE_OTHER): Payer: Self-pay | Admitting: Family Medicine

## 2012-10-12 DIAGNOSIS — R079 Chest pain, unspecified: Secondary | ICD-10-CM | POA: Insufficient documentation

## 2012-10-12 MED ORDER — REGADENOSON 0.4 MG/5 ML INTRAVENOUS SYRINGE
0.40 mg | INJECTION | INTRAVENOUS | Status: AC
Start: 2012-10-12 — End: 2012-10-12
  Administered 2012-10-12: 0.4 mg via INTRAVENOUS

## 2012-10-12 NOTE — Telephone Encounter (Signed)
Received fax from [facility], prior authorization for Celebrex was denied. Forms left in your mailbox for review. Then please have them scanned into [facility].  Myna Bright, RN 10/12/2012, 2:13 PM

## 2012-10-13 ENCOUNTER — Ambulatory Visit: Payer: MEDICAID | Attending: Family Medicine | Admitting: Family Medicine

## 2012-10-13 VITALS — Wt 214.7 lb

## 2012-10-13 DIAGNOSIS — M238X9 Other internal derangements of unspecified knee: Secondary | ICD-10-CM | POA: Insufficient documentation

## 2012-10-13 DIAGNOSIS — S8000XA Contusion of unspecified knee, initial encounter: Secondary | ICD-10-CM

## 2012-10-13 DIAGNOSIS — M24173 Other articular cartilage disorders, unspecified ankle: Secondary | ICD-10-CM | POA: Insufficient documentation

## 2012-10-13 DIAGNOSIS — S93409A Sprain of unspecified ligament of unspecified ankle, initial encounter: Secondary | ICD-10-CM

## 2012-10-13 DIAGNOSIS — S838X9A Sprain of other specified parts of unspecified knee, initial encounter: Secondary | ICD-10-CM

## 2012-10-13 NOTE — Progress Notes (Addendum)
Sports Medicine at [employer] [facility]  Operated by [facility]  Department of [facility] Medicine  [facility] of Medicine    Progress Note    Visit Date: 10/13/2012    Subjective:  [name] is a 56 y.o. female established patient of the Sports Medicine Center here for evaluation of her new left knee and ankle pain.  Pain has been present for 10 days.  There has been antecedent trauma.  Date of Injury (DOI) = 10/03/2012.  Mechanism of injury was: slipped on ice at home with twisting of knee and ankle behind her and landing hard on ground.  The pain is localized to the medial and anterior aspect of the knee and medial, lateral and anterior aspect of the ankle.  Rated as 8 out of 10.  Worsened with movement of knee or ankle, standing, walking and twisting.  Associated symptoms include: joint pain, joint swelling and negative for locking, buckling, and paresthesias.  Prior treatments include: ice, rest, wheeled walker, air stirrup ankle brace, ACE wrap on knee, Naprosyn, Ibuprofen, and Diclofenac.  These were not helpful.    X-rays: Prior x-rays of the patient's knee and ankle on 08/03/2012 showed mild degenerative changes in the knee with no acute fracture or bony abnormality.  Other 10-point ROS are negative.    PMFSH: I have reviewed and updated as appropriate the past medical, family, and social history.  PMH: GERD, Headaches, Depression, Knee OA, Patellofemoral Arthralgias, Carpal Tunnel Syndrome  Social Hx: Divorced, Non-smoker, Unemployed, Phone# (Home) [phone] (Cell) [phone].      Objective:  General: Well nourished, well developed female.  Frequently tearful during exam.  Wt 97.4 kg (214 lb 11.7 oz)   BMI 34.67 kg/m2   Knees: Exam was limited due to diffuse and focal pain.  Bilateral knees are examined.  There is no effusion on exam today.  There is genu valgus.  There is not tilting and lateralizing of the patella on inspection.  ROM is limited to about 0-60 degrees of extension-flexion on the left due to pain.  There is a negative J sign.  There is tenderness to palpation of the peripatellar structures and the left pes anserine insertion.  Palpation of the joint lines reveal medial tenderness.  Patellar tendon intact bilaterally.  Strength is normal.    Knee ligament exams: Valgus and Varus stress tests negative at 0 and 20 degrees for opening, but does produce pain.  Lachman and Pivot shift tests normal, but limited exam due to pain.  Posterior drawer and Posterior sag sign normal.  The meniscus exams were unable to be performed due to pain.    Ankles: Exam was limited due to diffuse and focal pain.  Bilateral ankles are examined.  ROM is within normal limits.  There is mild anterolateral swelling.  Achilles flexibility is fair.  There is inconsistent tenderness to palpation of the proximal fibula.  There is TTP lateral ankle ligaments, specifically the PTFL.  There is no TTP of the medial tendons.  There is no TTP peroneus tendons.  Anterior drawer test negative.  Talar tilt test negative.  Side to side test negative.  The external rotation and proximal squeeze tests produced pain over the proximal fibula.  Feet: Bilateral feet are examined.  Arches are normal.  ROM is within normal limits.  Strength and tone are normal bilaterally.  There is no swelling.  TTP none.    There is a normal neurovascular assessment distally.  There is no overlying  skin erythema, ecchymosis, rashes, or lesions.      Assessment:  1. Left Ankle Sprain; DOI = 10/03/2012  2. Left Knee Strain  3. Left Knee Contusion      Plan:  1. Ice 15 minutes QID and PRN.   2. I have written a prescription for physical therapy to improve the patient's symptoms and function.  This is the primary treatment option for this patient at this time.  3. NSAID's: I recommend celecoxib (Celebrex) 200 mg PO BID with food for at least a week.  The patient is to avoid other NSAID's concurrently.  4. X-rays: As above.  No new images at this time.  5. Brace: The patient was fitted with an ASO today by our ATC.  She is instructed to wear this at all times.   6. Follow up in 2 week(s) with Dr. Romeo Apple in Sports Medicine Clinic, sooner if worsening signs or symptoms.       Griffin Dakin, MD  Fellow-Sports Medicine  Dept of Family Medicine  [facility]  Operated by [facility]      Prior to the patient's discharge today, I saw and examined the patient. I agree with the resident's/fellow's assessment and plan. Any exceptions are noted.    Estrella Deeds, MD  Associate Professor  Director, Division of Sports Medicine  Director, Primary Care Sports Medicine Fellowship  Dept of Family Medicine  [facility]  Operated by [facility]

## 2012-10-16 ENCOUNTER — Encounter (INDEPENDENT_AMBULATORY_CARE_PROVIDER_SITE_OTHER): Payer: Self-pay | Admitting: Family Medicine

## 2012-10-16 NOTE — Progress Notes (Addendum)
Subjective:     Patient ID:  [name] is an 56 y.o. female   Chief Complaint:    Chief Complaint   Patient presents with   . Hospital Follow Up     chest pain; s/p fall     HPI  56 yo F with hx of depression / osteoarthritis / migraines here for h/d follow up. Pt was admitted to hospital s/p fall and developed chest pain while in ER for the evaluation of injuries s/p fall. Ruled out ACS with serial cardiac enzymes. Cardiac stress test scheduled on 10/12/12. Denies any new chest pain / sob / palpitations / cough/ fever/ abd pain / URI symptoms.   Through out interview today she is complaining of her left knee and left ankle pain - which she hurt prior to hospital admission. She has tried Naprosyn which we prescribed prior to h/d and developed abd pain and quit using that. Continues to use icing multiple times a day without any relief. She has tried other NSAIDs in the past - including Voltaren / motrin /mobic. She is allergic to blue dye so we can not presribe acid suppressants with her NSAIDs. Using ankle an knee braces. Has appointment with Sport medicine on 10/13/12.     ROS   General: No fevers, chills, night sweats, fatigue, weakness, or unintentional weight change.  HEENT: No headache, nasal congestion, sore throat  CV: No palpitations, chest pain or pressure, exertional dyspnea.  Pulm: No shortness of breath, cough, wheezing  GI: No N/V/D  MSK: as per HPI.  Neuro: No numbness, tingling, focal weakness, dizziness.    Objective:BP 112/74   Pulse 76   Temp(Src) 36.4 C (97.6 F) (Thermal Scan)   Wt 97.6 kg (215 lb 2.7 oz)   BMI 34.75 kg/m2     Physical Exam  .  General: no acute distress  Heart: regular rate and rhythm, no murmur  Lungs: Clear to auscultation bilaterally/ No adv sounds  Abdomen: bowel sounds normal, soft, non-tender, non-distended  Extremities: no cyanosis or edema; Detailed exam of Extremities was not performed today secondary to pain.   Skin: warm and dry  Neuro: AAO x 3     Assessment & Plan:   56 yo F her for h/d follow up for chest pain; Also has left knee and ankle pain from injury prior to chest pain     * No new episodes of chest pain   * Cardiac stress test scheduled on 10/12/12    Left knee / ankle pain - sprain    * Tried multiple NSAIDs in the past and no relief. Does not want to try Aleve because of upset  stomach. Acid suppressants can not be used with NSAIDs secondary to blue dye allergy   * Will try Celebrex 200 mg.    * Continue icing and bracing   * Encouraged to follow up with sports med as scheduled on 10/13/12    Follow up with PCP in 1 month with stress test results    [name], MD  PGY 2, Family Medicine   [facility]              I saw and examined the patient.  I reviewed the resident's note.  I agree with the findings and plan of care as documented in the resident's note.  Any exceptions/additions are edited/noted.    [name], DO 10/16/2012, 9:14 PM

## 2012-10-17 ENCOUNTER — Ambulatory Visit (INDEPENDENT_AMBULATORY_CARE_PROVIDER_SITE_OTHER): Payer: Self-pay | Admitting: Family Medicine

## 2012-10-17 NOTE — Telephone Encounter (Signed)
Prior auth required for celebrex 200 mg ID # [id number] / [phone]    Melaxicam is preferred with [facility].Marland KitchenMarland KitchenPlease advise....Wyline Beady, M.A, Triage

## 2012-10-17 NOTE — Telephone Encounter (Signed)
Message copied by Wyline Beady on Tue Oct 17, 2012  1:38 PM  ------       Message from: Tory Emerald       Created: Fri Oct 13, 2012 11:22 AM         >> Tory Emerald 10/13/2012 11:22 AM       [facility] Patient              Pharmacy called stating medication prescribed by Dr. Horton Chin needs prior auth.               celecoxib (CELEBREX) 200 mg Oral Capsule              [phone] ([facility]?)  ------

## 2012-10-24 ENCOUNTER — Other Ambulatory Visit (INDEPENDENT_AMBULATORY_CARE_PROVIDER_SITE_OTHER): Payer: Self-pay

## 2012-10-24 NOTE — Progress Notes (Signed)
 Contacted patient in regards to recent hospital discharge.  Patient has started outpatient PT for knee pain.  She will follow up with Dr. Margrette on 1/8.  Stress test performed on 12/19.  She denies any further episodes of chest pain since discharge and will follow up with Dr. Kathren on 1/8 as well.  Celebrex  was not approved by insurance and patient is currently using ice as needed and Advil  prn as well.      Olam Ling, RN 10/24/2012, 1:45 PM

## 2012-10-31 ENCOUNTER — Telehealth (INDEPENDENT_AMBULATORY_CARE_PROVIDER_SITE_OTHER): Payer: Self-pay | Admitting: Family Medicine

## 2012-10-31 DIAGNOSIS — R079 Chest pain, unspecified: Secondary | ICD-10-CM

## 2012-10-31 DIAGNOSIS — R9439 Abnormal result of other cardiovascular function study: Secondary | ICD-10-CM

## 2012-10-31 NOTE — Telephone Encounter (Signed)
Please call and offer her appt with Cardiology  Sent her my chart about an inconclusive stress test and would like the input from the heart doctors.  Theola Sequin, MD

## 2012-11-01 ENCOUNTER — Ambulatory Visit
Admission: RE | Admit: 2012-11-01 | Discharge: 2012-11-01 | Disposition: A | Discharge status: Home / Self Care | Payer: MEDICAID | Source: Ambulatory Visit | Attending: ORTHOPEDIC, SPORTS MEDICINE | Admitting: ORTHOPEDIC, SPORTS MEDICINE

## 2012-11-01 ENCOUNTER — Other Ambulatory Visit (INDEPENDENT_AMBULATORY_CARE_PROVIDER_SITE_OTHER): Payer: MEDICAID | Admitting: ORTHOPEDIC, SPORTS MEDICINE

## 2012-11-01 ENCOUNTER — Ambulatory Visit (HOSPITAL_BASED_OUTPATIENT_CLINIC_OR_DEPARTMENT_OTHER): Payer: MEDICAID | Admitting: Family Medicine

## 2012-11-01 ENCOUNTER — Encounter (INDEPENDENT_AMBULATORY_CARE_PROVIDER_SITE_OTHER): Payer: Self-pay | Admitting: ORTHOPEDIC, SPORTS MEDICINE

## 2012-11-01 ENCOUNTER — Ambulatory Visit (HOSPITAL_BASED_OUTPATIENT_CLINIC_OR_DEPARTMENT_OTHER): Payer: MEDICAID | Admitting: ORTHOPEDIC, SPORTS MEDICINE

## 2012-11-01 VITALS — BP 130/86 | HR 78 | Wt 213.2 lb

## 2012-11-01 VITALS — BP 130/86 | HR 78 | Temp 97.2°F | Ht 65.98 in | Wt 213.0 lb

## 2012-11-01 DIAGNOSIS — E042 Nontoxic multinodular goiter: Secondary | ICD-10-CM

## 2012-11-01 DIAGNOSIS — K219 Gastro-esophageal reflux disease without esophagitis: Secondary | ICD-10-CM

## 2012-11-01 DIAGNOSIS — M25579 Pain in unspecified ankle and joints of unspecified foot: Secondary | ICD-10-CM | POA: Insufficient documentation

## 2012-11-01 DIAGNOSIS — R9439 Abnormal result of other cardiovascular function study: Secondary | ICD-10-CM

## 2012-11-01 DIAGNOSIS — R209 Unspecified disturbances of skin sensation: Secondary | ICD-10-CM | POA: Insufficient documentation

## 2012-11-01 DIAGNOSIS — Z1239 Encounter for other screening for malignant neoplasm of breast: Secondary | ICD-10-CM

## 2012-11-01 DIAGNOSIS — M25569 Pain in unspecified knee: Secondary | ICD-10-CM | POA: Insufficient documentation

## 2012-11-01 MED ORDER — SUCRALFATE 100 MG/ML ORAL SUSPENSION
1.00 g | Freq: Four times a day (QID) | ORAL | Status: DC
Start: 2012-11-01 — End: 2013-03-29

## 2012-11-01 MED ORDER — GABAPENTIN 300 MG CAPSULE
300.00 mg | ORAL_CAPSULE | Freq: Every evening | ORAL | Status: DC
Start: 2012-11-01 — End: 2013-01-03

## 2012-11-01 NOTE — Progress Notes (Signed)
SUBJECTIVE:  [name] is a 57 y.o. female is here today for Hospital Discharge Follow Up      She was admitted recently 10/03/12 and discharged 10/04/12 for chest pain  Ruled out   Having increased GERD but unable to take most PPI due to blue dye allergy  No dysphagia  Zantac not effective in past  Avoiding gi irritants  Had MPS recently that showed transient dilitation which could be suggestive of CAD but no focal hypoperfusion  Does have risk factors:  +fam hx in sister age 4, 71 pk year smoker who quit 5 yrs ago  Lab Results   Component Value Date    CHOLESTEROL 184 10/04/2012    HDLCHOL 36* 10/04/2012    LDLCHOL 122* 10/04/2012    TRIG 131 10/04/2012      No further chest pain    Here today to see sports med also for left leg pain --seems neuropathic and so Dr Romeo Apple added gabapentin    Patient has Hypothyroidism and multiple thyroid nodules  .  No change that she has noted  Thyroid US in Sept 2013 unchanged  TSH   Date Value Range Status   10/14/2008 0.904  0.300 - 5.900 uIU/mL Final        THYROID STIMULATING HORMONE WITH FREE T4 REFLEX   Date Value Range Status   10/04/2012 1.646  0.300 - 5.900 uIU/mL Final          I have reviewed the patient's medical, surgical, family and social history in detail today, 11/01/2012  and updated the computerized patient record as appropriate.  has a current medication list which includes the following prescription(s): bupropion, clobetasol, fluoxetine, fluticasone, gabapentin, halobetasol propionate, minocycline, nystatin, sucralfate, and triamcinolone acetonide.   ROS: as above  OBJECTIVE:   Vitals: BP 130/86  Pulse 78  Temp(Src) 36.2 C (97.2 F)  Ht 1.676 m (5' 5.98")  Wt 96.616 kg (213 lb)  BMI 34.4 kg/m2   Appearance:in no apparent distress, in a wheelchair, has a walker with her  Exam: Neck: mild thyromegaly  Abdomen: soft, non-tender. Bowel sounds normal. No masses,  no organomegaly    ASSESSMENT:   PLAN:  1. GERD not well controlled (530.81)   sucralfate (CARAFATE) 100 mg/mL Oral Suspension qid   Take for one month and then if better can stop     2. Breast cancer screening (V76.10)  MAMMO BILATERAL SCREENING     3. Cardiovascular stress test abnormal (794.39)  Referred to Cardiology  She agrees with getting their input     4. Multinodular goiter (241.1)  Suggested repeat US in 1-2 years       Return in about 4 months (around 03/01/2013) for reflux.    Advised to call back directly if there are further questions, or if these symptoms fail to improve as anticipated or worsen.  Theola Sequin, MD  FAMILY MEDICINE-[facility]  Operated by [facility]  [address]  [place] [address]  Dept: [phone]  Dept Fax: [phone]

## 2012-11-01 NOTE — Telephone Encounter (Signed)
Appointment with cardiology 11/03/12.

## 2012-11-01 NOTE — Progress Notes (Signed)
Name: [name]  MEDICAL RECORD [id number]  DATE OF BIRTH: [birth year]  DATE OF SERVICE: 11/01/2012    SUBJECTIVE:  [name] presents to clinic today for follow up of her left knee and ankle pain.  She was seen for this initially 2-3 weeks ago and diagnosed with a left knee sprain and contusion as well as a left ankle sprain. They recommended treatment via physical therapy and NSAIDs with use of ACE wrap for the knee and ASO brace for the ankle. She now has completed two sessions of physical therapy and been taking the naproxen once daily without any improvement. She reports the pain is worst at night when trying to move her leg while trying to get comfortable in the bed but also with certain activities. (The ankle only hurts when the knee hurts.) The pain is intermittent, sharp in character (except more achy in the evening), located about anteromedial knee with radiation around to the lateral aspect of her ankle. She denies weakness, numbness, or tingling aside from on the dorsal aspect of her left foot which has some tingling but intact sensation. She has been icing for this, which helps temporarily. She has not been able to tolerate the naproxen due to GI upset and gastritis, and it has not seemed to help the pain though ibuprofen does aid with her pain (when taken as 600 mg twice daily). She is not mixing the naproxen and ibuprofen. She endorses knee swelling, denies locking/catching, and denies giving way. She endorses continued ankle swelling and sensation of instability inconsistently.     PAST MEDICAL HISTORY  Reviewed and updated as appropriate    SOCIAL HISTORY  History   Substance Use Topics   . Smoking status: Former Smoker -- 2.00 packs/day for 40 years     Types: Cigarettes     Quit date: 10/14/2005   . Smokeless tobacco: Never Used      Comment: quit 2007   . Alcohol Use: Yes      Comment: 1 glass wine/month       MEDICATIONS   Outpatient Prescriptions Marked as Taking for the 11/01/12 encounter (Office Visit) with Coy Saunas, MD   Medication Sig Dispense Refill   . buPROPion (WELLBUTRIN XL) 300 mg Oral Tablet Sustained Release 24 hr Take 1 Tab (300 mg total) by mouth Every morning  30 Tab  3   . Clobetasol (TEMOVATE) 0.05 % Apply externally Ointment Apply topically Twice daily To rash on hands and feet  60 g  0   . FLUoxetine (PROZAC) 20 mg Oral Tablet Take 1 Tab (20 mg total) by mouth Once a day Tablet necessary, allergy to dye in capsule.  30 Tab  3   . fluticasone (FLONASE) 50 mcg/actuation Nasal Spray, Suspension 2 Sprays by Each Nostril route Once a day.  16 g  11   . Halobetasol Propionate (ULTRAVATE) 0.05 % Apply externally Cream Apply  topically. Three times a week  15 g  2   . Minocycline (MINOCIN) 100 mg Oral Capsule Take 1 Cap (100 mg total) by mouth Once a day. Use for 10 days if need for recurrent boils  10 Cap  0   . triamcinolone acetonide (ARISTOCORT A) 0.1 % Apply externally Ointment by Apply Topically route Twice daily  240 g  0     No Facility-Administered Medications for the 11/01/12 encounter (Office Visit) with Coy Saunas, MD.        ALLERGIES  Allergies   Allergen  Reactions   . Cymbalta (Duloxetine)      Rash on feet and hands   . Blue Dye      Told to avoid due to patch test result by Abundio Miu   . Codeine Itching     RASH   . Mobic (Meloxicam) Nausea/ Vomiting   . Nickel    . Naprosyn (Naproxen) Nausea/ Vomiting       REVIEW OF SYSTEMS  As above, 10 point review of systems otherwise negative in detail.    PHYSICAL EXAM  BP 130/86   Pulse 78   Wt 96.7 kg (213 lb 3 oz)   BMI 34.43 kg/m2   General: Well-developed, well-nourished female in no acute distress  HEENT: Normocephalic, atraumatic, mucous membranes moist, neck supple  CV: Good perfusion throughout  Pulm: Breathing comfortably on room air  Skin: Warm and dry to touch   Psych: Alert and oriented x 3, normal mood and affect   Neurologic: Grossly intact to motor / sensation  Gait: Antalgic    On physical examination of the patient's left knee:  Inspection: area of swelling over distal anteromedial aspect  Tenderness to Palpation: diffuse, particularly over medial joint line and medial patellar facet but also over distal patella and proximal fibula  Swelling: trace effusion  Range of Motion: 0-90 degrees with pain with full extension and pain at extent of active flexion  Strength: 5/5 in flexion and extension with pain with resisted knee flexion  Special tests: positive patellar grind, negative Lachman's and anterior/posterior drawer, negative varus/valgus stress at 0 and 20 degrees, unable to perform McMurray's    On physical examination of the patient's left ankle:  Inspection: slight soft tissue swelling over lateral ankle  Tenderness to Palpation: ATFL, CFL, PTFL, and distal fibula (with the latter shooting burning pain up to the level of her knee)  Range of Motion: full in dorsiflexion, plantarflexion, inversion and eversion with pain with active eversion (felt over lateral ankle)  Strength: 5/5 in all of the above motions with pain with active eversion  Special tests: negative anterior drawer, talar and reverse talar tilt, and Thompson's testing    STUDIES: X-rays of tibia and fibula today reassuring for no sign of acute bony injury.    ASSESSMENT AND PLAN:  (719.46) Pain in joint, lower leg  (primary encounter diagnosis)  (719.47) Pain in joint, ankle and foot  Comment: Given reassuring X-rays, suspect that her current pain may be short-term related to nerve compression secondary to her swelling. Discussed that this should improve as she improves. Short-term, though, provided script for gabapentin to try at bedtime to hopefully aid the pain enough that she can sleep better.  Plan: XR TIBIA-FIBULA LEFT, gabapentin (NEURONTIN) 300 mg Oral Capsule          (782.0) Paresthesia  Plan: gabapentin (NEURONTIN) 300 mg Oral Capsule             She will f/u in 3 weeks, sooner as needed.    Coy Saunas, MD 11/01/2012, 9:17 AM

## 2012-11-03 ENCOUNTER — Ambulatory Visit: Payer: MEDICAID | Attending: Internal Medicine | Admitting: Internal Medicine

## 2012-11-03 ENCOUNTER — Encounter (HOSPITAL_BASED_OUTPATIENT_CLINIC_OR_DEPARTMENT_OTHER): Payer: Self-pay | Admitting: Internal Medicine

## 2012-11-03 VITALS — BP 108/70 | HR 115 | Ht 65.0 in | Wt 213.8 lb

## 2012-11-03 DIAGNOSIS — Z8249 Family history of ischemic heart disease and other diseases of the circulatory system: Secondary | ICD-10-CM | POA: Insufficient documentation

## 2012-11-03 DIAGNOSIS — R9439 Abnormal result of other cardiovascular function study: Secondary | ICD-10-CM | POA: Insufficient documentation

## 2012-11-03 DIAGNOSIS — Z7982 Long term (current) use of aspirin: Secondary | ICD-10-CM | POA: Insufficient documentation

## 2012-11-03 DIAGNOSIS — I209 Angina pectoris, unspecified: Secondary | ICD-10-CM | POA: Insufficient documentation

## 2012-11-03 DIAGNOSIS — F17200 Nicotine dependence, unspecified, uncomplicated: Secondary | ICD-10-CM | POA: Insufficient documentation

## 2012-11-03 DIAGNOSIS — Z9109 Other allergy status, other than to drugs and biological substances: Secondary | ICD-10-CM | POA: Insufficient documentation

## 2012-11-03 DIAGNOSIS — R0789 Other chest pain: Secondary | ICD-10-CM | POA: Insufficient documentation

## 2012-11-03 MED ORDER — NITROGLYCERIN 0.4 MG SUBLINGUAL TABLET
0.4000 mg | SUBLINGUAL_TABLET | SUBLINGUAL | Status: DC | PRN
Start: 2012-11-03 — End: 2013-05-30

## 2012-11-03 MED ORDER — METOPROLOL SUCCINATE ER 25 MG TABLET,EXTENDED RELEASE 24 HR
25.0000 mg | ORAL_TABLET | Freq: Every day | ORAL | Status: DC
Start: 2012-11-03 — End: 2012-12-19

## 2012-11-03 NOTE — H&P (Signed)
[facility]                              DEPARTMENT OF MEDICINE                                [place], [place] [address]                                PATIENT NAME: KAMAIYAH, USELTON [facility] [id number]  DATE OF SERVICE:11/03/2012  DATE OF BIRTH: May 06, 1956    HISTORY AND PHYSICAL    PATIENT IDENTIFICATION/CHIEF COMPLAINT/HISTORY OF PRESENT ILLNESS:  Ms. Coriana Angello is a 57 year old woman with no previous history of coronary artery disease who has risk factors for coronary artery disease which include nicotine dependence, her age and a family history of premature coronary artery disease.  She is being referred to our clinic for a myocardial perfusion scan, which she had performed on October 12, 2012, where her LV systolic function was found to be normal at 67%; however, she did have mild transient ischemic dilatation which would suggest multivessel disease in a patient with intermittent likelihood of disease.  There were no perfusion defects, however.  This would be a higher risk scan.  The patient states she gets symptoms when she is walking of chest heaviness located retrosternally.  She was in the Emergency Department after a fall and when she would lay flat she would have intense chest heaviness and when she sat up the chest heaviness went away after about 5 minutes; however, upon questioning she states that when she does walk around she gets chest discomfort, chest pressure and heaviness in the center of her chest, and also associated shortness of breath on exertion.  The patient is not a diabetic.  She does not have hypertension or dyslipidemia.  Her risk factors are listed above.      PAST MEDICAL HISTORY:  Gastroesophageal reflux disease, multinodular goiter, depression, arthritis, vaginal prolapse.     PAST SURGICAL HISTORY:  Appendectomy, hysterectomy, tonsillectomy, cholecystectomy, and colonoscopy.      ALLERGIES:  Cymbalta, blue dye, codeine, Mobic, nickel, and she says all metal which causes blisters on her skin which could be an issue with cardiac stent implantation.     MEDICATIONS:   1.  Wellbutrin 300 mg q. a.m.   2.  Prozac 20 mg daily.   3.  Flonase.   4.  Gabapentin.     NEW MEDICATIONS STARTED TODAY:  1.  Toprol XL 25 mg daily.   2.  Nitroglycerin 0.4 mg p.r.n.    3.  Aspirin 81 mg daily.     SOCIAL HISTORY:  An 80-pack-year history of smoking, occasional alcohol.     FAMILY HISTORY:  Sister, age 7 years old, was diagnosed with coronary artery disease.  Father had an MI at 66 years of age.     REVIEW OF SYSTEMS:  As per history of present illness, positive for angina-type symptoms with exertion.  All other review of systems is listed in the patient's handout, which was reviewed, signed and placed in the patient's chart.      PHYSICAL EXAMINATION:  Vital signs:  Blood pressure is 108/70, pulse 115, oxygen saturation 98% on room air.  Weight is 213 pounds.  Height is 5 feet 5 inches.  General:  No acute distress.  Very pleasant woman.  Head and neck:  Exam revealed no jugular venous distention.  Extraocular muscles intact.  PERRLA.  Normocephalic, atraumatic.  No jugular venous distention.  Cardiac:  Exam is regular rate and rhythm, normal S1 and S2 with no significant murmurs.  Lungs:  Clear to auscultation bilaterally.  No wheezes, rales or rhonchi.  Abdomen:  Soft, nontender, normal bowel sounds.  Extremities:  No edema in bilateral lower extremities.  Neurologic:  Cranial nerves II through XII grossly intact, no focal deficits.      LABORATORY DATA:  October 03, 2012, ECG revealed sinus rhythm with no ischemic changes.  Myocardial perfusion scan showed mild transient ischemic dilatation with no perfusion defects.  December 2013, total cholesterol 184, HDL 36, LDL 122, triglycerides 131.  Troponins were negative during her hospitalization.  TSH 1.646, potassium 4.1, BUN 16, creatinine 0.99.       ASSESSMENT:    1.  Positive myocardial perfusion scan with mild transient ischemic dilatation, which is a higher risk scan.   2.  Symptoms of typical and atypical angina.    3.  Risk factors for coronary artery disease.   4.  Nicotine dependence.   5.  Family history of premature coronary artery disease.   6.  Nickel/metal allergy.     RECOMMENDATIONS:  1.  Had a long discussion with the patient about cardiac catheterization.  She is agreeable to the procedure.  I am concerned, however, about her nickel/metal allergy.  Our plan would be have her come to the hospital next week, get a bare metal stent and place this on her skin and see if she has any reaction to this.  We will then proceed with cardiac catheterization if she has no reaction to this metal stent.  She understands the risks and benefits of cardiac catheterization.   2.  I will start the patient on an aspirin 81 mg daily, Toprol XL 25 mg daily and also give her nitroglycerin to take on a p.r.n. basis.      In summary, I am concerned about the patient's metal allergy.  We will give her a stent to place on her skin to see if she gets reaction.  We did review the literature on nickel allergy and metal allergy.  There was a study done at [facility] and also in Up To Date and it states that stent implantation with bare metal, nickel, cobalt chromium should not affect patients with nickel allergy.      Jodi Geralds, MD  Assistant Professor  Interventional Cardiology  [place] [facility]    [id number]; D: 11/03/2012 15:15:43; T: 11/03/2012 19:19:25    cc: Jerry Caras MD      Shirleen Schirmer

## 2012-11-03 NOTE — Progress Notes (Signed)
See dictated note.

## 2012-11-07 ENCOUNTER — Ambulatory Visit
Admission: RE | Admit: 2012-11-07 | Discharge: 2012-11-07 | Disposition: A | Discharge status: Home / Self Care | Payer: MEDICAID | Source: Ambulatory Visit | Attending: Family Medicine | Admitting: Family Medicine

## 2012-11-07 DIAGNOSIS — Z1231 Encounter for screening mammogram for malignant neoplasm of breast: Secondary | ICD-10-CM | POA: Insufficient documentation

## 2012-11-10 ENCOUNTER — Ambulatory Visit (HOSPITAL_COMMUNITY): Payer: MEDICAID | Admitting: Internal Medicine

## 2012-11-10 ENCOUNTER — Encounter (INDEPENDENT_AMBULATORY_CARE_PROVIDER_SITE_OTHER): Payer: Self-pay | Admitting: Family Medicine

## 2012-11-10 ENCOUNTER — Encounter (HOSPITAL_COMMUNITY): Payer: Self-pay

## 2012-11-10 ENCOUNTER — Inpatient Hospital Stay
Admission: RE | Admit: 2012-11-10 | Discharge: 2012-11-10 | Disposition: A | Discharge status: Home / Self Care | Payer: MEDICAID | Source: Ambulatory Visit | Attending: Internal Medicine | Admitting: Internal Medicine

## 2012-11-10 DIAGNOSIS — I25119 Atherosclerotic heart disease of native coronary artery with unspecified angina pectoris: Secondary | ICD-10-CM | POA: Insufficient documentation

## 2012-11-10 DIAGNOSIS — I2510 Atherosclerotic heart disease of native coronary artery without angina pectoris: Secondary | ICD-10-CM | POA: Insufficient documentation

## 2012-11-10 DIAGNOSIS — Z8249 Family history of ischemic heart disease and other diseases of the circulatory system: Secondary | ICD-10-CM | POA: Insufficient documentation

## 2012-11-10 DIAGNOSIS — F17200 Nicotine dependence, unspecified, uncomplicated: Secondary | ICD-10-CM | POA: Insufficient documentation

## 2012-11-10 DIAGNOSIS — R0602 Shortness of breath: Secondary | ICD-10-CM | POA: Insufficient documentation

## 2012-11-10 HISTORY — DX: Atherosclerotic heart disease of native coronary artery without angina pectoris: I25.10

## 2012-11-10 LAB — CREATININE: ESTIMATED GLOMERULAR FILTRATION RATE: 48 mL/min/{1.73_m2} — ABNORMAL LOW (ref >59)

## 2012-11-10 LAB — CBC/DIFF
BASOPHILS: 1 %
BASOS ABS: 0.098 10*3/uL (ref 0.0–0.2)
EOS ABS: 0.186 10*3/uL (ref 0.0–0.5)
EOSINOPHIL: 3 %
HCT: 41.3 % (ref 33.5–45.2)
HGB: 14 g/dL (ref 11.2–15.2)
LYMPHOCYTES: 29 %
LYMPHS ABS: 2.071 10*3/uL (ref 1.0–4.8)
MCH: 29.1 pg (ref 27.4–33.0)
MCHC: 33.8 g/dL (ref 32.5–35.8)
MCV: 86.1 fL (ref 78–100)
MONOCYTES: 10 %
MONOS ABS: 0.687 THOU/uL (ref 0.3–1.0)
MPV: 7.3 fL — ABNORMAL LOW (ref 7.5–11.5)
PLATELET COUNT: 335 THOU/uL (ref 140–450)
PMN ABS: 4.058 10*3/uL (ref 1.5–7.7)
PMN'S: 57 %
RBC: 4.8 MIL/uL (ref 3.63–4.92)
RDW: 14.4 % (ref 12.0–15.0)
WBC: 7.1 10*3/uL (ref 3.5–11.0)

## 2012-11-10 LAB — TYPE AND SCREEN
ABO/RH(D): B POS
ANTIBODY SCREEN: NEGATIVE

## 2012-11-10 LAB — ELECTROLYTES
ANION GAP: 6 mmol/L (ref 5–16)
CARBON DIOXIDE: 21 mmol/L — ABNORMAL LOW (ref 22–32)
CHLORIDE: 109 mmol/L (ref 96–111)
POTASSIUM: 3.9 mmol/L (ref 3.5–5.1)
SODIUM: 136 mmol/L (ref 136–145)

## 2012-11-10 LAB — PT/INR
INR: 1 (ref 0.8–1.2)
PROTHROMBIN TIME: 10.5 s (ref 9.1–12.5)

## 2012-11-10 LAB — GLUCOSE, NON FASTING: GLUCOSE,NONFAST: 104 mg/dL (ref 65–139)

## 2012-11-10 LAB — BUN
BUN/CREAT RATIO: 18 (ref 6–22)
BUN: 21 mg/dL — ABNORMAL HIGH (ref 6–20)

## 2012-11-10 LAB — PTT (PARTIAL THROMBOPLASTIN TIME): APTT: 23.3 s (ref 22.0–32.0)

## 2012-11-10 LAB — CREATININE WITH EGFR: CREATININE: 1.18 mg/dL — ABNORMAL HIGH (ref 0.49–1.10)

## 2012-11-10 MED ORDER — SODIUM CHLORIDE 0.9 % (FLUSH) INJECTION SYRINGE
2.0000 mL | INJECTION | Freq: Three times a day (TID) | INTRAMUSCULAR | Status: DC
Start: 2012-11-10 — End: 2012-11-10

## 2012-11-10 MED ORDER — SODIUM CHLORIDE 0.9 % (FLUSH) INJECTION SYRINGE
2.0000 mL | INJECTION | INTRAMUSCULAR | Status: DC | PRN
Start: 2012-11-10 — End: 2012-11-10

## 2012-11-10 MED ORDER — SODIUM CHLORIDE 0.9 % INTRAVENOUS SOLUTION
INTRAVENOUS | Status: DC
Start: 2012-11-10 — End: 2012-11-10

## 2012-11-10 MED ORDER — MIDAZOLAM 1 MG/ML INJECTION SOLUTION
INTRAMUSCULAR | Status: AC
Start: 2012-11-10 — End: 2012-11-10
  Filled 2012-11-10: qty 4

## 2012-11-10 MED ORDER — LIDOCAINE HCL 20 MG/ML (2 %) INJECTION SOLUTION
1.00 mL | INTRAMUSCULAR | Status: AC
Start: 2012-11-10 — End: 2012-11-10
  Administered 2012-11-10: 20 mg via INTRADERMAL

## 2012-11-10 MED ORDER — VERAPAMIL 2.5 MG/ML INTRAVENOUS SOLUTION
INTRAVENOUS | Status: AC
Start: 2012-11-10 — End: 2012-11-10
  Filled 2012-11-10: qty 2

## 2012-11-10 MED ORDER — HEPARIN (PORCINE) 1,000 UNIT/ML INJECTION SOLUTION
INTRAMUSCULAR | Status: AC
Start: 2012-11-10 — End: 2012-11-10
  Filled 2012-11-10: qty 10

## 2012-11-10 MED ORDER — FENTANYL (PF) 50 MCG/ML INJECTION SOLUTION
INTRAMUSCULAR | Status: AC
Start: 2012-11-10 — End: 2012-11-10
  Filled 2012-11-10: qty 2

## 2012-11-10 MED ADMIN — verapamiL 2.5 mg/mL intravenous solution: 5 mg | INTRA_ARTERIAL | NDC 00409114401

## 2012-11-10 MED ADMIN — iopamidoL 61 % intravenous solution: 70 mL | INTRA_ARTERIAL | NDC 00270131535

## 2012-11-10 MED ADMIN — fentaNYL (PF) 50 mcg/mL injection solution: 50 ug | INTRAVENOUS | NDC 10019003727

## 2012-11-10 MED ADMIN — midazolam 1 mg/mL injection solution: 2 mg | INTRAVENOUS | NDC 63323041112

## 2012-11-10 MED ADMIN — midazolam 1 mg/mL injection solution: 1 mg | INTRAVENOUS | NDC 63323041112

## 2012-11-10 MED ADMIN — heparin (porcine) 1,000 unit/mL injection solution: 2000 [IU] | NDC 25021040010

## 2012-11-10 MED ADMIN — sodium chloride 0.9 % intravenous solution: 0 | INTRAVENOUS

## 2012-11-10 NOTE — Nurses Notes (Signed)
Patient returned from procedure. Report taken from Al, RN. Patient awake but drowsy. TR Band in place on right wrist. No hematoma noted. Patient attached to telemetry, pulse oximetry, and blood pressure monitoring. Family brought to bedside. Educated on importance of letting his right wrist rest and to not move wrist too much until TR Band has been removed. Also educated that TR Band will stay in place with air inside for 1 hour and then will decrease afterwards depending on bleeding. Will continue to monitor.

## 2012-11-10 NOTE — Nurses Notes (Signed)
 Dr Lynna at pt bedside to discuss procedural findings.  All questions answered at this time.

## 2012-11-10 NOTE — Nurses Notes (Signed)
1045  TR band removed at this time.  Transparent dressing CDI.    1050  Pt sitting up in bed eating and drinking without any difficulty.

## 2012-11-10 NOTE — Nurses Notes (Signed)
Patient ambulated back to CCC. Offered restroom. Changed into gown. Instructed on use of call bell and television remote. Vitals signs obtained. Attempting IV access and lab collection at this time. Will prep accordingly. Awaiting healthcare provider to consent patient. Will continue to monitor.

## 2012-11-10 NOTE — Nurses Notes (Signed)
Dr Cleone Slim at pts bedside to consent pt for procedure.  All questions answered at this time.

## 2012-11-10 NOTE — Nurses Notes (Signed)
Pt able to tolerate eating and drinking without any difficulty. Gait steady without difficult. Pt returned to pre procedure baseline. IV discontinued x2. Verbalized understanding of discharge instructions. Escorted to lobby via wheelchair by RN.

## 2012-11-10 NOTE — Discharge Instructions (Signed)
Discharge Instructions after Your Cardiac Catheterization with Radial Approach      Activity  For the next 24 hours  . You may feel sleepy.  Do not drive or operate machinery or power tools.  (You must have someone take you home)  . Do not drink any alcoholic beverages  . Do not make any important decisions or sign any legal documents  . Avoid any pushing or pulling motions with affected wrist for 3 days  . Do not lift with the affected arm for 48 hours  . Follow your doctors instructions about returning to work      Care of Wound  . Keep the dressing in place for 24 hours.  You may remove the dressing and shower after 24 hours.  . Cleanse the site gently with soap and water. Pat area dry. No lotions or creams.  Leave the site open to air.  . Do not get your wrist saturated for 5-7 days or until the wound is healed.      Diet  . You may resume your normal diet.  . To help eliminate contrast material from your body, we encourage you to increase your fluid intake   If you are taking Metformin (Glucophage) hold this medication for 3 days and then resume as prescribed.    Call your doctor if you have:  . If bleeding or a hard bruise develops under the skin, apply manual pressure and call 911 immediately.  . Signs that you may be bleeding are: tightness, stinging, pain, warmness, wetness where the puncture site was put in your wrist.    . If bleeding occurs, lie down and apply firm pressure about 2 fingers above the site.  Call 911 and keep pressure on the site.  . Severe Pain, numbness, tingling, change in color or coldness of the arm used for puncture site.   . Temperature greater than 101 degrees F.  . Redness around the site.  . Drainage from the site.

## 2012-11-13 NOTE — Progress Notes (Addendum)
Discharge home.    Wannetta Sender, MD 11/13/2012, 1:06 PM

## 2012-11-20 ENCOUNTER — Encounter (HOSPITAL_COMMUNITY): Payer: MEDICAID | Admitting: Psychiatry

## 2012-11-22 ENCOUNTER — Other Ambulatory Visit (INDEPENDENT_AMBULATORY_CARE_PROVIDER_SITE_OTHER): Payer: MEDICAID | Admitting: ORTHOPEDIC, SPORTS MEDICINE

## 2012-11-29 ENCOUNTER — Ambulatory Visit: Payer: MEDICAID | Attending: ORTHOPEDIC, SPORTS MEDICINE | Admitting: ORTHOPEDIC, SPORTS MEDICINE

## 2012-11-29 VITALS — BP 119/72 | HR 88 | Wt 210.1 lb

## 2012-11-29 DIAGNOSIS — R209 Unspecified disturbances of skin sensation: Secondary | ICD-10-CM | POA: Insufficient documentation

## 2012-11-29 DIAGNOSIS — M25569 Pain in unspecified knee: Secondary | ICD-10-CM | POA: Insufficient documentation

## 2012-11-29 DIAGNOSIS — M25579 Pain in unspecified ankle and joints of unspecified foot: Secondary | ICD-10-CM | POA: Insufficient documentation

## 2012-11-29 NOTE — Progress Notes (Addendum)
 Name: [name]  MEDICAL RECORD [id number]  DATE OF BIRTH: July 21, 1956  DATE OF SERVICE: 11/29/2012    SUBJECTIVE:  [name] presents to clinic today for follow-up of her left knee and ankle pain.  She was previously recommended ice, PT, and Naprosyn .  She stopped the Naprosyn  due to GI irritation and is now taking OTC Advil  400 mg Q4 PRN.  The patient states the ankle pain has resolved but the knee pain symptoms show no change.  The patient rates the pain as 8 out of 10, localized to the medial and anterior aspect of the knee joint.  The pain is worse with movement of knee and walking.  Redness, bruising, swelling, or mechanical symptoms: positive for swelling and pseudo-buckling.  Prior paresthesias have resolved.  The patient has been to about 7 or 8 sessions of physical therapy at [facility] in [place], [place].    PAST MEDICAL HISTORY  Reviewed and updated as appropriate    PAST SURGICAL HISTORY  Past Surgical History   Procedure Laterality Date   . Pb upper gi endoscopy,biopsy  12/19/07     patulous GE junction zone, erythema, nonerosive GERD   . Hx ankle fracture tx  2007     left distal fibula, casted   . Hx wrist fracture tx  2007     FOOSH injury   . Hx partial hysterectomy     . Colonoscopy  01/06/09     COLONOSCOPY performed by KARIN DINE F at [facility] OR ENDO   . Hx cystocele repair  09/24/09   . Hx gall bladder surgery/chole     . Hx adenoidectomy     . Hx tonsillectomy     . Gastroscopy  03/05/2010     GASTROSCOPY performed by PAWA, SWATI at [facility] OR ENDO   . Gastroscopy with biopsy  03/05/2010     GASTROSCOPY WITH BIOPSY performed by PAWA, SWATI at [facility] OR ENDO   . Pb revise ulnar nerve at elbow  1979   . Hx hysterectomy     . Hx appendectomy     . Hx colonoscopy     . Hx hand surgery         FAMILY HISTORY  Family History   Problem Relation Age of Onset   . Congestive Heart Failure Father 50     died at 74   . Coronary Artery Disease Father    . Heart Attack Father    . Heart Attack Sister     2   . Hypertension Brother    . Hypertension Sister    . Hypertension Sister    . Kidney Disease Sister      ESRD   . Stroke Paternal Grandmother    . Diabetes Mother    . High Cholesterol Mother    . Hypertension Mother    . Coronary Artery Disease Mother 14     CABG   . Thyroid  Disease Sister    . Bipolar Disorder Daughter    . Cancer Paternal Aunt      Breast Ca   . Cancer Paternal Aunt      Breast Ca   . Cancer Paternal Aunt      Breast Ca   . Cancer Paternal Aunt      Breast Ca   . Cancer Paternal Aunt      Breast Ca   . Cancer Paternal Uncle      Leukemia   . Cancer Other 56  Breast Ca/Breast Ca       SOCIAL HISTORY  History   Substance Use Topics   . Smoking status: Former Smoker -- 2.00 packs/day for 40 years     Types: Cigarettes     Quit date: 10/14/2005   . Smokeless tobacco: Never Used      Comment: quit 2007   . Alcohol Use: Yes      Comment: 1 glass wine/month       MEDICATIONS  Outpatient Prescriptions Marked as Taking for the 11/29/12 encounter (Office Visit) with Margrette Mylinda FALCON, MD   Medication Sig Dispense Refill   . buPROPion  (WELLBUTRIN  XL) 300 mg Oral Tablet Sustained Release 24 hr Take 1 Tab (300 mg total) by mouth Every morning  30 Tab  3   . Clobetasol  (TEMOVATE ) 0.05 % Apply externally Ointment Apply topically Twice daily To rash on hands and feet  60 g  0   . FLUoxetine  (PROZAC ) 20 mg Oral Tablet Take 1 Tab (20 mg total) by mouth Once a day Tablet necessary, allergy to dye in capsule.  30 Tab  3   . fluticasone  (FLONASE ) 50 mcg/actuation Nasal Spray, Suspension 2 Sprays by Each Nostril route Once a day.  16 g  11   . gabapentin  (NEURONTIN ) 300 mg Oral Capsule Take 1 Cap (300 mg total) by mouth Every night  30 Cap  1   . Halobetasol  Propionate (ULTRAVATE ) 0.05 % Apply externally Cream Apply  topically. Three times a week  15 g  2   . metoprolol  succinate (TOPROL -XL) 25 mg Oral Tablet Sustained Release 24 hr Take 1 Tab (25 mg total) by mouth Once a day  30 Tab  5   . Minocycline   (MINOCIN ) 100 mg Oral Capsule Take 1 Cap (100 mg total) by mouth Once a day. Use for 10 days if need for recurrent boils  10 Cap  0   . nitroglycerin  (NITROSTAT ) 0.4 mg Sublingual Tablet, Sublingual 1 Tab (0.4 mg total) by Sublingual route Every 5 minutes as needed for Chest pain for 3 doses over 15 minutes  30 Tab  5   . nystatin  (NYSTOP ) 100,000 unit/gram Apply externally Powder Apply once or twice daily to PREVENT rash under breasts  60 g  11   . sucralfate  (CARAFATE ) 100 mg/mL Oral Suspension Take 10 mL (1 g total) by mouth Every 6 hours  420 mL  2   . triamcinolone  acetonide (ARISTOCORT  A) 0.1 % Apply externally Ointment by Apply Topically route Twice daily  240 g  0     No Facility-Administered Medications for the 11/29/12 encounter (Office Visit) with Margrette Mylinda FALCON, MD.        ALLERGIES  Allergies   Allergen Reactions   . Cymbalta  (Duloxetine )      Rash on feet and hands   . Blue Dye      Told to avoid due to patch test result by Elita LUNA   . Codeine  Itching     RASH   . Mobic  (Meloxicam ) Nausea/ Vomiting   . Nickel    . Naprosyn  (Naproxen ) Nausea/ Vomiting       REVIEW OF SYSTEMS  As above, 10 point review of systems otherwise negative in detail.    PHYSICAL EXAM  BP 119/72  Pulse 88  Wt 95.3 kg (210 lb 1.6 oz)  BMI 34.96 kg/m2   General: Well-developed, well-nourished female in no acute distress  HEENT: Normocephalic, atraumatic, mucous membranes moist, neck supple  CV: Good perfusion  throughout  Pulm: Breathing comfortably on room air  Skin: Warm and dry to touch   Psych: Alert and oriented x 3, normal mood and affect  Neurologic: Grossly intact to motor / sensation  Gait: Antalgic    On physical examination of the patient's left knee:  Inspection: minimal medial swelling  Tenderness to Palpation: diffusely, but mostly focused on the medial joint line and medial patellar facet  Swelling: positive, minimal  Range of Motion: full  Strength: 5/5  Special tests: Positive patellar grind, negative  Lachman's, negative anterior/posterior drawer, negative meniscus exams, negative valgus/varus stress testing     STUDIES: Prior x-rays of the patient's knee and tibia/fibula showed minimal degenerative changes and no acute bony abnormality.     ASSESSMENT AND PLAN:  1. Left knee pain (719.46)  ASPIRATION/INJECTION MAJOR JOINT/BURSA (AMB ONLY)   2. Left ankle pain (719.47)     3. Paresthesias/numbness (782.0)       [name]' ankle pain and paresthesias have resolved at this time.  However, the patient continues to have left knee pain.  The patient was offered and received a steroid injection at this visit.  See procedure note for further details.  She is advised to continue PT, icing, and OTC Advil  to improve her symptoms.  She will follow-up in 4 weeks, sooner as needed.    Sheena Endo, MD 11/29/2012, 10:57 AM      I have seen and evaluated the patient. I have discussed the patient's case with the fellow and I agree with the history, physical examination, assessment and plan as documented.    Mylinda Nichole Minerva, MD

## 2012-11-29 NOTE — Progress Notes (Signed)
 11/29/12 1145   Medication Administration   Initials lydi Darvell Monteforte   Medication  Kenalog    Medication Dose 40mg    NDC # 9996-9706-71   LOT # [id number]   Expiration date 01/23/14   Comments: 1ml

## 2012-11-29 NOTE — Progress Notes (Signed)
 11/29/12 1100   Medication Administration   Initials Iori Gigante   Medication  Betamethasone    Medication Dose 6mg    NDC # 9482-9279-98   LOT # [id number]   Expiration date 12/23/13   Comments: 1ml

## 2012-12-08 ENCOUNTER — Encounter (HOSPITAL_BASED_OUTPATIENT_CLINIC_OR_DEPARTMENT_OTHER): Payer: Self-pay | Admitting: Internal Medicine

## 2012-12-08 ENCOUNTER — Ambulatory Visit: Payer: MEDICAID | Attending: Internal Medicine | Admitting: Internal Medicine

## 2012-12-08 VITALS — BP 122/70 | HR 88 | Ht 65.0 in | Wt 210.5 lb

## 2012-12-08 DIAGNOSIS — I10 Essential (primary) hypertension: Secondary | ICD-10-CM | POA: Insufficient documentation

## 2012-12-08 DIAGNOSIS — I2510 Atherosclerotic heart disease of native coronary artery without angina pectoris: Secondary | ICD-10-CM | POA: Insufficient documentation

## 2012-12-08 DIAGNOSIS — E785 Hyperlipidemia, unspecified: Secondary | ICD-10-CM | POA: Insufficient documentation

## 2012-12-08 MED ORDER — ASPIRIN 81 MG CHEWABLE TABLET
81.0000 mg | CHEWABLE_TABLET | Freq: Every day | ORAL | Status: DC
Start: ? — End: 2013-03-29

## 2012-12-08 MED ORDER — ATORVASTATIN 20 MG TABLET
20.00 mg | ORAL_TABLET | Freq: Every evening | ORAL | Status: DC
Start: 2012-12-08 — End: 2013-08-29

## 2012-12-10 NOTE — Procedures (Signed)
A timeout was performed before the procedure. After discussion of the risks and benefits of a corticosteroid injection into her left knee, she provided verbal consent. Using an anterolateral approach, the knee was sterilely prepped then superficially numbed with ethyl chloride spray after which a mixture of 3 mL 1% lidocaine, 6 mg betamethasone, and 40 mg Kenalog was injected without difficulty. The patient tolerated the procedure well.    Rhyder Koegel F Talibah Colasurdo, MD

## 2012-12-18 ENCOUNTER — Ambulatory Visit: Payer: MEDICAID | Attending: DERMATOLOGY | Admitting: Dermatology

## 2012-12-18 VITALS — BP 114/76 | Ht 65.12 in | Wt 206.8 lb

## 2012-12-18 DIAGNOSIS — L301 Dyshidrosis [pompholyx]: Secondary | ICD-10-CM | POA: Insufficient documentation

## 2012-12-18 DIAGNOSIS — L851 Acquired keratosis [keratoderma] palmaris et plantaris: Secondary | ICD-10-CM | POA: Insufficient documentation

## 2012-12-18 DIAGNOSIS — D235 Other benign neoplasm of skin of trunk: Secondary | ICD-10-CM | POA: Insufficient documentation

## 2012-12-18 DIAGNOSIS — R209 Unspecified disturbances of skin sensation: Secondary | ICD-10-CM | POA: Insufficient documentation

## 2012-12-19 ENCOUNTER — Encounter (INDEPENDENT_AMBULATORY_CARE_PROVIDER_SITE_OTHER): Payer: Self-pay | Admitting: Neurology

## 2012-12-19 ENCOUNTER — Ambulatory Visit: Payer: MEDICAID | Attending: Neurology | Admitting: Family Medicine

## 2012-12-19 ENCOUNTER — Ambulatory Visit (HOSPITAL_BASED_OUTPATIENT_CLINIC_OR_DEPARTMENT_OTHER): Payer: MEDICAID | Admitting: Neurology

## 2012-12-19 VITALS — BP 114/72 | HR 100 | Ht 65.0 in | Wt 209.4 lb

## 2012-12-19 VITALS — BP 114/72 | HR 100 | Temp 96.8°F | Wt 207.9 lb

## 2012-12-19 DIAGNOSIS — R209 Unspecified disturbances of skin sensation: Secondary | ICD-10-CM | POA: Insufficient documentation

## 2012-12-19 MED ORDER — GABAPENTIN 300 MG CAPSULE
300.00 mg | ORAL_CAPSULE | Freq: Three times a day (TID) | ORAL | Status: DC
Start: 2012-12-19 — End: 2013-03-28

## 2012-12-19 NOTE — H&P (Addendum)
[facility] [facility] Department of Neurology  Outpatient History and Physical    Date:  12/19/2012  Name: [name]  Age:  57 y.o.  Referring Physician:   Coy Saunas, MD  [address]  [address]  [place], [place] [address]    History of Present Illness  History obtained from:  patient    [name] is a 57 y.o. female who presents with a chief complaint of tingling in head.  Patient reports began abruptly having tingling in her posterior head about 1 week ago that started suddenly and is occurring 10-30 times daily.  Spells can last from 15 seconds to 2-3 minutes.  It is tingling only and it is not painful and patient feels it is superficial and not deep.  She does not recognize any provocative/palliative factors.  Does not report headaches and does not have hx of headaches.  No other symptoms and denies numbness, tingling ,weakness, visual disturbance, dysphagia, dysarthria.    Chief Complaint   Patient presents with   . Tingling     back left side of head        The patient reports: no other complaints.     Past Medical History  Current Outpatient Prescriptions   Medication Sig   . aspirin 81 mg Oral Tablet, Chewable Take 1 Tab (81 mg total) by mouth Once a day   . atorvastatin (LIPITOR) 20 mg Oral Tablet Take 1 Tab (20 mg total) by mouth Every evening   . buPROPion (WELLBUTRIN XL) 300 mg Oral Tablet Sustained Release 24 hr Take 1 Tab (300 mg total) by mouth Every morning   . Clobetasol (TEMOVATE) 0.05 % Apply externally Ointment Apply topically Twice daily To rash on hands and feet   . FLUoxetine (PROZAC) 20 mg Oral Tablet Take 1 Tab (20 mg total) by mouth Once a day Tablet necessary, allergy to dye in capsule.   . fluticasone (FLONASE) 50 mcg/actuation Nasal Spray, Suspension 2 Sprays by Each Nostril route Once a day.   . gabapentin (NEURONTIN) 300 mg Oral Capsule Take 1 Cap (300 mg total) by mouth Every night    . Halobetasol Propionate (ULTRAVATE) 0.05 % Apply externally Cream Apply  topically. Three times a week   . Minocycline (MINOCIN) 100 mg Oral Capsule Take 1 Cap (100 mg total) by mouth Once a day. Use for 10 days if need for recurrent boils   . nitroglycerin (NITROSTAT) 0.4 mg Sublingual Tablet, Sublingual 1 Tab (0.4 mg total) by Sublingual route Every 5 minutes as needed for Chest pain for 3 doses over 15 minutes   . nystatin (NYSTOP) 100,000 unit/gram Apply externally Powder Apply once or twice daily to PREVENT rash under breasts   . sucralfate (CARAFATE) 100 mg/mL Oral Suspension Take 10 mL (1 g total) by mouth Every 6 hours   . triamcinolone acetonide (ARISTOCORT A) 0.1 % Apply externally Ointment by Apply Topically route Twice daily   . [DISCONTINUED] cephALEXin (KEFLEX) 500 mg Oral Capsule Take 500 mg by mouth Three times a day   . [DISCONTINUED] metoprolol succinate (TOPROL-XL) 25 mg Oral Tablet Sustained Release 24 hr Take 1 Tab (25 mg total) by mouth Once a day     Allergies   Allergen Reactions   . Cymbalta (Duloxetine)      Rash on feet and hands   . Blue Dye      Told to avoid due to patch test result by Abundio Miu   . Codeine Itching  RASH   . Mobic (Meloxicam) Nausea/ Vomiting   . Nickel    . Naprosyn (Naproxen) Nausea/ Vomiting     Past Medical History   Diagnosis Date   . depression    . Multinodular goiter    . Headache      Migraines   . Arthritis    . Vaginal prolapse    . Rheumatic fever 17     no complications   . Depression    . GERD (gastroesophageal reflux disease)      controlled w/med   . Wears glasses    . Wears dentures    . Contact dermatitis and other eczema, due to unspecified cause      allergic reaction to detergent   . MINOR CAD (coronary artery disease) 11/10/2012     Past Surgical History   Procedure Laterality Date   . Pb upper gi endoscopy,biopsy  12/19/07     patulous GE junction zone, erythema, nonerosive GERD   . Hx ankle fracture tx  2007     left distal fibula, casted    . Hx wrist fracture tx  2007     FOOSH injury   . Hx partial hysterectomy     . Colonoscopy  01/06/09     COLONOSCOPY performed by Augustin Schooling F at [facility] OR ENDO   . Hx cystocele repair  09/24/09   . Hx gall bladder surgery/chole     . Hx adenoidectomy     . Hx tonsillectomy     . Gastroscopy  03/05/2010     GASTROSCOPY performed by Knox Saliva at [facility] OR ENDO   . Gastroscopy with biopsy  03/05/2010     GASTROSCOPY WITH BIOPSY performed by Knox Saliva at [facility] OR ENDO   . Pb revise ulnar nerve at elbow  1979   . Hx hysterectomy     . Hx appendectomy     . Hx colonoscopy     . Hx hand surgery       Family History   Problem Relation Age of Onset   . Congestive Heart Failure Father 50     died at 45   . Coronary Artery Disease Father    . Heart Attack Father    . Heart Attack Sister      85   . Hypertension Brother    . Hypertension Sister    . Hypertension Sister    . Kidney Disease Sister      ESRD   . Stroke Paternal Grandmother    . Diabetes Mother    . High Cholesterol Mother    . Hypertension Mother    . Coronary Artery Disease Mother 44     CABG   . Thyroid Disease Sister    . Bipolar Disorder Daughter    . Cancer Paternal Aunt      Breast Ca   . Cancer Paternal Aunt      Breast Ca   . Cancer Paternal Aunt      Breast Ca   . Cancer Paternal Aunt      Breast Ca   . Cancer Paternal Aunt      Breast Ca   . Cancer Paternal Uncle      Leukemia   . Cancer Other 56     Breast Ca/Breast Ca     History     Social History   . Marital Status: Divorced     Spouse Name: N/A     Number of  Children: N/A   . Years of Education: N/A     Occupational History   . 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 Smoking status: Former Smoker -- 2.00 packs/day for 40 years     Types: Cigarettes     Quit date: 10/14/2005   . Smokeless tobacco: Never Used      Comment: quit 2007   . Alcohol Use: Yes      Comment: 1 glass wine/month   . Drug Use: No   . Sexually Active: Not on file     Other Topics Concern    . Uses Cane No   . Uses Walker No   . Uses Wheelchair No   . Right Hand Dominant Yes   . Left Hand Dominant No   . Ambidextrous No   . Ability To Walk 1 Flight Of Steps Without Sob/Cp Yes   . Shift Work No   . Unusual Sleep-Wake Schedule No     Social History Narrative   . No narrative on file       Review of Systems  Other than ROS in the HPI, all other systems were negative.    Examination:    Vitals: BP 114/72   Pulse 100   Ht 1.651 m (5\' 5" )   Wt 95 kg (209 lb 7 oz)   BMI 34.85 kg/m2  General: no distress  Ophthalmoscopic: normal w/o hemorrhages, exudates, or papilledema  Carotids:Carotids normal without bruit  Orientation: Alert and oriented x 3  Memory: Registration, Recall, and Following of commands is normal  Attention: Attention and Concentration are normal  Knowledge: Good  Language: Normal  Speech: Normal  Cranial nerves: Cranial nerves 2-12 are normal  Gait:: Normal  Coordination: Coordination is normal without tremor  Sensory: Sensory exam in the upper and lower extremities is normal  Muscle tone: Upper and lower muscle tone is normal  Motor strength:  Normal strength except noted at LLE 4/5 however limited due to knee pain  Reflexes: Reflexes are 2/2 throughout  Plantar Left  Normal Right  Normal        Assessment and Plan  1. Paresthesias/numbness      57 yo female with new onset of paresthesias in posterior head regions which patient feels are superficial.  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  RTC 4 months and patient to call if new or worsening symptoms.  [name], MD 12/19/2012, 3:54 PM        Late entry for 12/19/12. I saw and examined the patient.  I reviewed the resident's note.  I agree with the findings and plan of care as documented in the resident's note.  Any exceptions/additions are edited/noted.    [name], MD 12/20/2012, 9:29 AM

## 2012-12-20 NOTE — Progress Notes (Addendum)
 [facility]                              DEPARTMENT OF MEDICINE                                [place], [place] [address]                                PATIENT NAME: [name], [name] [facility] [id number]:[id number]  DATE OF SERVICE:12/08/2012  DATE OF BIRTH: 10-30-1955    PROGRESS NOTE    SUBJECTIVE:  Ms. Holck is a 57 year old female who on October 12, 2012, had an MPS, which revealed TID.  She had a subsequent catheterization on November 10, 2012, which revealed only mild CAD.  She does have a history of hypertension, hyperlipidemia and former tobacco user.  She reports that since her cardiac catheterization, she has felt well.  She has had no chest pain, shortness of breath, or lower extremity edema.  She is not taking cholesterol medicine currently, but is tolerating the remainder of her cardiac medications well.  Her blood pressure has been well controlled.  Last lipid panel revealed an LDL of 122.  She quit smoking 6-7 years ago.  She is able to exercise without difficulty.    OBJECTIVE:  On exam, her pressure is 122/70, pulse 88.  She is 5 feet 5 inches tall, weighs 210 pounds, O2 sats are 98% on room air.  She is a 57 year old female who appears her stated age, is in no acute distress.  Neck reveals no JVD or carotid bruit.  Lungs:  Clear to auscultation bilaterally.  Heart:  Regular rate and rhythm, no murmurs or rubs.  Abdomen:  Nondistended, nontender.  Legs reveal no edema bilaterally.  Skin:  Warm and dry.  Neurologic:  She is grossly intact.  She is alert and oriented x3 with appropriate mood and affect.      ASSESSMENT AND PLAN:  1.  Hypertension, well controlled.  Continue current antihypertensive regimen.  2.  Hyperlipidemia.  The patient did have mild coronary artery disease in the setting of her disease.  We will start her on Lipitor 20 mg daily with a repeat lipid panel in 6-8 weeks.  She expressed understanding of this.  3.  Coronary artery  disease, asymptomatic.  Continue on current cardiac medications.  We have advised the patient to start taking a low-dose aspirin  daily.  She expressed understanding of this.    Return to clinic in 1 year or sooner if problems arise or symptoms develop.      Vernell Irvine, PA-C  [facility]    Curtistine Bos, MD  Assistant Professor  Interventional Cardiology  [facility]    I have seen and evaluated the patient jointly with Vernell Irvine, PA-C and agree with her assessment, recommendations and plans.    Curtistine Bos, MD 12/20/2012, 2:28 PM             MC/fod/[id number]; D: 12/19/2012 10:35:25; T: 12/20/2012 01:08:13    cc: Darice Coats MD      CHRISTINIA

## 2012-12-20 NOTE — Addendum Note (Signed)
Addended by: [name] on: 12/20/2012 09:30 AM     Modules accepted: Level of Service

## 2012-12-27 ENCOUNTER — Other Ambulatory Visit (INDEPENDENT_AMBULATORY_CARE_PROVIDER_SITE_OTHER): Payer: MEDICAID | Admitting: ORTHOPEDIC, SPORTS MEDICINE

## 2013-01-03 ENCOUNTER — Ambulatory Visit: Payer: MEDICAID | Attending: ORTHOPEDIC, SPORTS MEDICINE | Admitting: ORTHOPEDIC, SPORTS MEDICINE

## 2013-01-03 VITALS — BP 138/80 | HR 90 | Wt 207.0 lb

## 2013-01-03 NOTE — Progress Notes (Signed)
Name: [name]  MEDICAL RECORD [id number]  DATE OF BIRTH: 03/22/1956  DATE OF SERVICE: 01/03/2013    SUBJECTIVE:  [name] presents to clinic today for follow up of her left knee pain. At last visit it was thought to be related to nerve compression at least partially so in addition to encouraging continued physical therapy, she was given both a corticosteroid injection and a script for gabapentin. Since last visit, she reports physical therapy has helped her knee get stronger but she still considers the pain to be 8/10. She notes slight improvement after the corticosteroid injection. She did not notice any improvement with use of gabapentin. However, of note, her gabapentin dose was increased 4 weeks ago due to neuropathy elsewhere. She has been icing as well. She denies knee swelling, locking/catching, or giving way.     PAST MEDICAL HISTORY  Reviewed and updated as appropriate    SOCIAL HISTORY  History   Substance Use Topics   . Smoking status: Former Smoker -- 2.00 packs/day for 40 years     Types: Cigarettes     Quit date: 10/14/2005   . Smokeless tobacco: Never Used      Comment: quit 2007   . Alcohol Use: Yes      Comment: 1 glass wine/month       MEDICATIONS  Outpatient Prescriptions Marked as Taking for the 01/03/13 encounter (Office Visit) with Coy Saunas, MD   Medication Sig Dispense Refill   . aspirin 81 mg Oral Tablet, Chewable Take 1 Tab (81 mg total) by mouth Once a day    0   . atorvastatin (LIPITOR) 20 mg Oral Tablet Take 1 Tab (20 mg total) by mouth Every evening  30 Tab  11   . buPROPion (WELLBUTRIN XL) 300 mg Oral Tablet Sustained Release 24 hr Take 1 Tab (300 mg total) by mouth Every morning  30 Tab  3   . Clobetasol (TEMOVATE) 0.05 % Apply externally Ointment Apply topically Twice daily To rash on hands and feet  60 g  0   . FLUoxetine (PROZAC) 20 mg Oral Tablet Take 1 Tab (20 mg total) by mouth Once a day Tablet necessary, allergy to dye in capsule.  30 Tab  3    . fluticasone (FLONASE) 50 mcg/actuation Nasal Spray, Suspension 2 Sprays by Each Nostril route Once a day.  16 g  11   . gabapentin (NEURONTIN) 300 mg Oral Capsule Take 1 Cap (300 mg total) by mouth Three times a day  90 Cap  5   . Halobetasol Propionate (ULTRAVATE) 0.05 % Apply externally Cream Apply  topically. Three times a week  15 g  2   . Minocycline (MINOCIN) 100 mg Oral Capsule Take 1 Cap (100 mg total) by mouth Once a day. Use for 10 days if need for recurrent boils  10 Cap  0   . nitroglycerin (NITROSTAT) 0.4 mg Sublingual Tablet, Sublingual 1 Tab (0.4 mg total) by Sublingual route Every 5 minutes as needed for Chest pain for 3 doses over 15 minutes  30 Tab  5   . nystatin (NYSTOP) 100,000 unit/gram Apply externally Powder Apply once or twice daily to PREVENT rash under breasts  60 g  11   . sucralfate (CARAFATE) 100 mg/mL Oral Suspension Take 10 mL (1 g total) by mouth Every 6 hours  420 mL  2   . triamcinolone acetonide (ARISTOCORT A) 0.1 % Apply externally Ointment by Apply Topically route Twice daily  240 g  0     No Facility-Administered Medications for the 01/03/13 encounter (Office Visit) with Coy Saunas, MD.        ALLERGIES  Allergies   Allergen Reactions   . Cymbalta (Duloxetine)      Rash on feet and hands   . Blue Dye      Told to avoid due to patch test result by Abundio Miu   . Codeine Itching     RASH   . Mobic (Meloxicam) Nausea/ Vomiting   . Nickel    . Naprosyn (Naproxen) Nausea/ Vomiting       REVIEW OF SYSTEMS  As above, 10 point review of systems otherwise negative in detail.    PHYSICAL EXAM  BP 138/80   Pulse 90   Wt 93.9 kg (207 lb 0.2 oz)   BMI 34.45 kg/m2   General: Well-developed, well-nourished female in no acute distress  HEENT: Normocephalic, atraumatic, mucous membranes moist, neck supple  CV: Good perfusion throughout  Pulm: Breathing comfortably on room air  Skin: Warm and dry to touch   Psych: Alert and oriented x 3, normal mood and affect   Neurologic: Grossly intact to motor / sensation  Gait: Antalgic    On physical examination of the patient's left knee:  Inspection: normal  Tenderness to Palpation: medial joint line  Swelling: no effusion  Range of Motion: full actively in flexion and extension  Strength: 5/5 in flexion and extension  Special tests: positive valgus stress at 0 and 20 degrees, negative varus stress at 0 and 20 degrees, negative patellar grind, negative Lachman's, negative McMurray's    STUDIES: deferred    ASSESSMENT AND PLAN:  (719.46) Pain in joint, lower leg  (primary encounter diagnosis)  Plan: For now, continue physical therapy + icing    (844.1) Sprain of medial collateral ligament of knee  Comment: suspected on exam and likely contributing to her ongoing pain symptoms  Plan: ORTHOTIC MGT TRAINING (AMB ONLY) - hinged knee brace             She will f/u in 4 weeks, sooner as needed.    Coy Saunas, MD 01/03/2013, 11:22 AM

## 2013-01-03 NOTE — Progress Notes (Addendum)
 Brace Fitting:  After reviewing our brace policy and giving the patient opportunity to get this brace at another location, they have selected us  to apply this brace and gave consent for us  to send their PHI and insurance information to [employer]. I have fitted this patient with a 3XL Rebound ROM Knee Wrap Brace to their own body dimensions on the left side and explained how to use it. They are to use this brace as needed. This took approximately 15 minutes.    Bohden Dung L. Theotis Raddle., LAT, ATC/R, NASM-PES  Director of Community education officer  Division of Sports Medicine   Department of Family Medicine      Mylinda JULIANNA Minerva, MD

## 2013-01-04 NOTE — Progress Notes (Addendum)
Allayne Gitelman. [facility]  Department of Family Medicine   [facility]  Operated by [facility]      Patient ID:  [name] is a 57 y.o. female  Encounter Date:  12/19/2012    CC:  Tingling    Subjective    HPI:  [name] reports to clinic for the above reason(s).    1. Tingling - Patient presents for evaluation of tingling in her left posterior occipital area that began a week ago.  The patient reports numbness and tingling and denies burning and hypersensitivity.  Symptoms are mild.  Symptoms occur intermittently, several times an hour while awake.  PMH is significant for migraine headaches and depression.  Current treatment includes self-massage, with minimal relief.  She denies fevers, chills, nausea, vomiting, chest pain, and dyspnea.    PFMSH:   I have reviewed and updated in the electronic medical record as appropriate the past medical, family, and social history.    Review of Systems:  As per HPI.  All other systems in 10-point ROS reviewed and are negative.    Objective    Vitals:  BP 114/72   Pulse 100   Temp(Src) 36 C (96.8 F) (Thermal Scan)   Wt 94.3 kg (207 lb 14.3 oz)   BMI 34.47 kg/m2  General:  Well-developed, pleasantly conversant female in no acute distress.  HEENT:  NC/AT.  Decreased tactile sensation in left occipital area, with no pain, swelling, or lesion.  PERRLA.  TMPG bilaterally.  MMM.  No pharyngeal edema/erythema.  Neck:  Supple, no JVD, no lymphadenopathy.  Cardiac:  RRR without murmur.  S1 + S2 normal.  Pulmonary:  CTAB without wheezes/rhonchi.    Assessment/Plan:    (782.0) Paresthesias/numbness  (primary encounter diagnosis)  Plan: AMB CONSULT/REFERRAL NEUROLOGY        New problem with additional work-up planned.  Etiology unclear.  Will refer to neurology for further evaluation and management.      Follow-up with Theola Sequin, MD as scheduled next month, or sooner as needed.        Forde Dandy, MD   Fellow-Primary Care Sports Medicine  [place] [facility]  Operated by [facility]      I discussed the patient's care with the Fellow prior to the patient leaving the clinic. Any significant discussion points are noted .    Coy Saunas, MD

## 2013-01-12 ENCOUNTER — Telehealth (HOSPITAL_COMMUNITY): Payer: Self-pay | Admitting: Psychiatry

## 2013-01-12 DIAGNOSIS — F339 Major depressive disorder, recurrent, unspecified: Secondary | ICD-10-CM

## 2013-01-12 MED ORDER — FLUOXETINE 20 MG TABLET
20.0000 mg | ORAL_TABLET | Freq: Every day | ORAL | Status: DC
Start: 2013-01-12 — End: 2013-04-13

## 2013-01-12 MED ORDER — BUPROPION HCL XL 300 MG 24 HR TABLET, EXTENDED RELEASE
300.00 mg | ORAL_TABLET | Freq: Every morning | ORAL | Status: DC
Start: 2013-01-12 — End: 2013-04-13

## 2013-01-12 NOTE — Telephone Encounter (Signed)
Sent    Mykiah Schmuck J. Izetta Dakin, MD 01/12/2013, 11:56 AM      Called RDT. Will not allow both without letter from treating provider. Dr. Alferd Patee to address on Monday when he returns.     [name]. Izetta Dakin, MD 01/12/2013, 11:57 AM

## 2013-01-14 ENCOUNTER — Encounter (HOSPITAL_COMMUNITY): Payer: Self-pay | Admitting: Psychiatry

## 2013-01-15 ENCOUNTER — Encounter (HOSPITAL_COMMUNITY): Payer: Self-pay | Admitting: Psychiatry

## 2013-01-22 ENCOUNTER — Encounter (INDEPENDENT_AMBULATORY_CARE_PROVIDER_SITE_OTHER): Payer: MEDICAID | Admitting: Family Medicine

## 2013-02-07 ENCOUNTER — Ambulatory Visit (HOSPITAL_BASED_OUTPATIENT_CLINIC_OR_DEPARTMENT_OTHER): Payer: MEDICAID | Admitting: ORTHOPEDIC, SPORTS MEDICINE

## 2013-02-07 ENCOUNTER — Encounter (INDEPENDENT_AMBULATORY_CARE_PROVIDER_SITE_OTHER): Payer: Self-pay | Admitting: ORTHOPEDIC, SPORTS MEDICINE

## 2013-02-07 ENCOUNTER — Ambulatory Visit
Admission: RE | Admit: 2013-02-07 | Discharge: 2013-02-07 | Disposition: A | Discharge status: Home / Self Care | Payer: MEDICAID | Source: Ambulatory Visit | Attending: ORTHOPEDIC, SPORTS MEDICINE | Admitting: ORTHOPEDIC, SPORTS MEDICINE

## 2013-02-07 VITALS — BP 130/82 | HR 90 | Temp 97.4°F | Wt 208.8 lb

## 2013-02-07 DIAGNOSIS — M25569 Pain in unspecified knee: Secondary | ICD-10-CM | POA: Insufficient documentation

## 2013-02-07 DIAGNOSIS — R079 Chest pain, unspecified: Secondary | ICD-10-CM | POA: Insufficient documentation

## 2013-02-07 NOTE — Progress Notes (Signed)
Name: [name]  MEDICAL RECORD [id number]  DATE OF BIRTH: February 13, 1956  DATE OF SERVICE: 02/07/2013    SUBJECTIVE:  [name] presents to clinic today for follow up of her left knee pain thought to be related to OA and on last visit a possible MCL injury. Since last visit she has been wearing the brace that was provided at that time, and it still is helping with her function and pain with ambulation. She received a corticosteroid injection prior which she reports provided limited relief. She continues to have pain when sitting, which is 7/10. The pain she feels now is constant, at the lowest 4/10 (when she is lying down) and at the highest 8-9/10 (when she turns over in bed). (She does not wear her brace when she is sleeping.) Her pain still is located around medial border of patella. She has been doing her home exercises now that she stopped formal physical therapy; she had to stop physical therapy due to recurrence of a skin problem on the soles of her feet. She feels she has made some progress overall, and recently has been limited more due to other conditions. She reports constant bilateral knee swelling, no locking/catching but occasional stiffness/pain with attempting to straighten the leg, and no giving way since last visit.     PAST MEDICAL HISTORY  Reviewed and updated as appropriate    SOCIAL HISTORY  History   Substance Use Topics   . Smoking status: Former Smoker -- 2.00 packs/day for 40 years     Types: Cigarettes     Quit date: 10/14/2005   . Smokeless tobacco: Never Used      Comment: quit 2007   . Alcohol Use: Yes      Comment: 1 glass wine/month       MEDICATIONS  Outpatient Prescriptions Marked as Taking for the 02/07/13 encounter (Office Visit) with Coy Saunas, MD   Medication Sig Dispense Refill   . aspirin 81 mg Oral Tablet, Chewable Take 1 Tab (81 mg total) by mouth Once a day    0    . atorvastatin (LIPITOR) 20 mg Oral Tablet Take 1 Tab (20 mg total) by mouth Every evening  30 Tab  11   . buPROPion (WELLBUTRIN XL) 300 mg Oral Tablet Sustained Release 24 hr Take 1 Tab (300 mg total) by mouth Every morning  30 Tab  3   . Clobetasol (TEMOVATE) 0.05 % Apply externally Ointment Apply topically Twice daily To rash on hands and feet  60 g  0   . FLUoxetine (PROZAC) 20 mg Oral Tablet Take 1 Tab (20 mg total) by mouth Once a day Tablet necessary, allergy to dye in capsule.  30 Tab  3   . fluticasone (FLONASE) 50 mcg/actuation Nasal Spray, Suspension 2 Sprays by Each Nostril route Once a day.  16 g  11   . gabapentin (NEURONTIN) 300 mg Oral Capsule Take 1 Cap (300 mg total) by mouth Three times a day  90 Cap  5   . Halobetasol Propionate (ULTRAVATE) 0.05 % Apply externally Cream Apply  topically. Three times a week  15 g  2   . Minocycline (MINOCIN) 100 mg Oral Capsule Take 1 Cap (100 mg total) by mouth Once a day. Use for 10 days if need for recurrent boils  10 Cap  0   . nitroglycerin (NITROSTAT) 0.4 mg Sublingual Tablet, Sublingual 1 Tab (0.4 mg total) by Sublingual route Every 5 minutes as needed for  Chest pain for 3 doses over 15 minutes  30 Tab  5   . nystatin (NYSTOP) 100,000 unit/gram Apply externally Powder Apply once or twice daily to PREVENT rash under breasts  60 g  11   . sucralfate (CARAFATE) 100 mg/mL Oral Suspension Take 10 mL (1 g total) by mouth Every 6 hours  420 mL  2   . triamcinolone acetonide (ARISTOCORT A) 0.1 % Apply externally Ointment by Apply Topically route Twice daily  240 g  0     No Facility-Administered Medications for the 02/07/13 encounter (Office Visit) with Coy Saunas, MD.        ALLERGIES  Allergies   Allergen Reactions   . Cymbalta (Duloxetine)      Rash on feet and hands   . Blue Dye      Told to avoid due to patch test result by Abundio Miu   . Codeine Itching     RASH   . Mobic (Meloxicam) Nausea/ Vomiting   . Nickel     . Naprosyn (Naproxen) Nausea/ Vomiting       REVIEW OF SYSTEMS  As above, 10 point review of systems otherwise negative in detail.    PHYSICAL EXAM  BP 130/82   Pulse 90   Temp(Src) 36.3 C (97.4 F)   Wt 94.7 kg (208 lb 12.4 oz)   BMI 34.74 kg/m2   General: Well-developed, well-nourished female in no acute distress  HEENT: Normocephalic, atraumatic, mucous membranes moist, neck supple  CV: Good perfusion throughout  Pulm: Breathing comfortably on room air  Skin: Warm and dry to touch   Psych: Alert and oriented x 3, normal mood and affect  Neurologic: Grossly intact to motor / sensation  Gait: Normal    On physical examination of the patient's left knee:  Inspection: normal  Tenderness to Palpation: patella and medial joint line but not over posterior corners or patellar facets  Swelling: trace effusion  Range of Motion: 0-105 degrees actively with crepitus  Strength: 5/5 in flexion and extension  Special tests: positive patellar grind, positive valgus stress for pain at 0 and 20 degrees, negative varus stress at 0 and 20 degrees, negative McMurray's, negative Lachman's    STUDIES: full left knee series ordered today as prior only had 2 views done    ASSESSMENT AND PLAN:  (719.46) Pain in joint, lower leg  (primary encounter diagnosis)  Comment: ?patellar contusion and/or MCL injury  Plan:   -- Given persistence of pain, will proceed with further imaging - full left knee series and MRI pending X-ray findings = XR KNEE LEFT, MRI KNEE LEFT WO CONTRAST  -- Continue home exercises and icing in interim    She will f/u in 4 weeks, sooner as needed.      Coy Saunas, MD 02/07/2013, 11:02 AM

## 2013-03-14 ENCOUNTER — Ambulatory Visit (HOSPITAL_COMMUNITY): Payer: Self-pay | Admitting: Psychiatry

## 2013-03-14 NOTE — Telephone Encounter (Signed)
Called patient. Did not reach. Patient has not been seen in ~7 months - no showed last appointment and didn't make a follow-up appointment. Will need to be seen in clinic prior to any intervention. If patient calls back she should be informed of same. If she is suicidal, she should be directed to the nearest emergency department.    Dian Queen, MD 03/14/2013, 12:38 PM

## 2013-03-21 ENCOUNTER — Ambulatory Visit
Admission: RE | Admit: 2013-03-21 | Discharge: 2013-03-21 | Disposition: A | Discharge status: Home / Self Care | Payer: MEDICAID | Source: Ambulatory Visit | Attending: ORTHOPEDIC, SPORTS MEDICINE | Admitting: ORTHOPEDIC, SPORTS MEDICINE

## 2013-03-28 ENCOUNTER — Encounter (INDEPENDENT_AMBULATORY_CARE_PROVIDER_SITE_OTHER): Payer: Self-pay

## 2013-03-28 ENCOUNTER — Ambulatory Visit: Payer: MEDICAID | Attending: ORTHOPEDIC, SPORTS MEDICINE | Admitting: ORTHOPEDIC, SPORTS MEDICINE

## 2013-03-28 VITALS — BP 118/72 | HR 90 | Wt 209.2 lb

## 2013-03-28 DIAGNOSIS — M25569 Pain in unspecified knee: Secondary | ICD-10-CM | POA: Insufficient documentation

## 2013-03-28 DIAGNOSIS — Z87891 Personal history of nicotine dependence: Secondary | ICD-10-CM | POA: Insufficient documentation

## 2013-03-28 DIAGNOSIS — S83419A Sprain of medial collateral ligament of unspecified knee, initial encounter: Secondary | ICD-10-CM | POA: Insufficient documentation

## 2013-03-28 MED ORDER — GABAPENTIN 300 MG CAPSULE
600.0000 mg | ORAL_CAPSULE | Freq: Three times a day (TID) | ORAL | Status: DC
Start: 2013-03-28 — End: 2013-04-17

## 2013-03-28 NOTE — Progress Notes (Signed)
Name: [name]  MEDICAL RECORD [id number]  DATE OF BIRTH: [birth year]  DATE OF SERVICE: 03/28/2013    SUBJECTIVE:  [name] presents to clinic today for follow up of her left knee pain. Unfortunately she reports that her knee pain is 7/10 in severity constant sharp pain that radiates up to her thigh and down to her toes. The pain mainly is located medially and the hinged knee brace she has helps, but it slides a lot (making it ineffective at those times). Of note, she has been having increased activity and under increased stress due to having to move out of her dwelling. She continues with baseline bilateral knee swelling but denies locking/catching or giving way. Unfortunately she has not been able to return to physical therapy yet because of her worsening palmar and sole rash, for which she is following with dermatology. She has been icing though, and doing her exercises as instructed when she can.    PAST MEDICAL HISTORY  Reviewed and updated as appropriate    SOCIAL HISTORY  History   Substance Use Topics   . Smoking status: Former Smoker -- 2.00 packs/day for 40 years     Types: Cigarettes     Quit date: 10/14/2005   . Smokeless tobacco: Never Used      Comment: quit 2007   . Alcohol Use: Yes      Comment: 1 glass wine/month       MEDICATIONS  Outpatient Prescriptions Marked as Taking for the 03/28/13 encounter (Office Visit) with Coy Saunas, MD   Medication Sig Dispense Refill   . aspirin 81 mg Oral Tablet, Chewable Take 1 Tab (81 mg total) by mouth Once a day    0   . atorvastatin (LIPITOR) 20 mg Oral Tablet Take 1 Tab (20 mg total) by mouth Every evening  30 Tab  11   . buPROPion (WELLBUTRIN XL) 300 mg Oral Tablet Sustained Release 24 hr Take 1 Tab (300 mg total) by mouth Every morning  30 Tab  3   . Clobetasol (TEMOVATE) 0.05 % Apply externally Ointment Apply topically Twice daily To rash on hands and feet  60 g  0    . FLUoxetine (PROZAC) 20 mg Oral Tablet Take 1 Tab (20 mg total) by mouth Once a day Tablet necessary, allergy to dye in capsule.  30 Tab  3   . fluticasone (FLONASE) 50 mcg/actuation Nasal Spray, Suspension 2 Sprays by Each Nostril route Once a day.  16 g  11   . gabapentin (NEURONTIN) 300 mg Oral Capsule Take 1 Cap (300 mg total) by mouth Three times a day  90 Cap  5   . Halobetasol Propionate (ULTRAVATE) 0.05 % Apply externally Cream Apply  topically. Three times a week  15 g  2   . Minocycline (MINOCIN) 100 mg Oral Capsule Take 1 Cap (100 mg total) by mouth Once a day. Use for 10 days if need for recurrent boils  10 Cap  0   . nitroglycerin (NITROSTAT) 0.4 mg Sublingual Tablet, Sublingual 1 Tab (0.4 mg total) by Sublingual route Every 5 minutes as needed for Chest pain for 3 doses over 15 minutes  30 Tab  5   . nystatin (NYSTOP) 100,000 unit/gram Apply externally Powder Apply once or twice daily to PREVENT rash under breasts  60 g  11   . sucralfate (CARAFATE) 100 mg/mL Oral Suspension Take 10 mL (1 g total) by mouth Every 6 hours  420 mL  2   . triamcinolone acetonide (ARISTOCORT A) 0.1 % Apply externally Ointment by Apply Topically route Twice daily  240 g  0     No Facility-Administered Medications for the 03/28/13 encounter (Office Visit) with Coy Saunas, MD.        ALLERGIES  Allergies   Allergen Reactions   . Cymbalta (Duloxetine)      Rash on feet and hands   . Blue Dye      Told to avoid due to patch test result by Abundio Miu   . Codeine Itching     RASH   . Mobic (Meloxicam) Nausea/ Vomiting   . Nickel    . Naprosyn (Naproxen) Nausea/ Vomiting       REVIEW OF SYSTEMS  As above, 10 point review of systems otherwise negative in detail beyond worsening of her rash on her hands and feet, for which she is seeing dermatology, and a lot of stress.    PHYSICAL EXAM  BP 118/72   Pulse 90   Wt 94.9 kg (209 lb 3.5 oz)   BMI 34.82 kg/m2   General: Well-developed, well-nourished female in no acute distress   HEENT: Normocephalic, atraumatic, mucous membranes moist, neck supple  CV: Good perfusion throughout  Pulm: Breathing comfortably on room air  Skin: Warm and dry to touch   Psych: Alert and oriented x 3, tearful  Neurologic: Grossly intact to motor / sensation  Gait: Antalgic    On physical examination of the patient's left knee:  Inspection: in brace  Tenderness to Palpation: deferred  Swelling: no large effusion  Range of Motion: active 0-90 degrees at least  Strength: deferred  Special tests: deferred  Deferred full exam given her distress due to her changing home situation. Will re-evaluate at follow up.    ASSESSMENT AND PLAN:  (719.46) Pain in joint, lower leg  (primary encounter diagnosis)  (844.1) Sprain of medial collateral ligament of knee  Plan:   Continue in hinged knee brace  Continue icing  Continue doing home exercises as tolerated  Hopefully restart aquatic therapy (physical therapy) once rash resolves/improves    (729.5) Pain in limb  Given that today she is reporting radiating pain raising some question of neuropathic component, provided script to increase gabapentin and will continue to follow.  Plan: gabapentin (NEURONTIN) 300 mg Oral Capsule - 600 mg three times daily (as gradual increase of 300 mg per dose per 3-4 day period)             She will f/u in 6 weeks, sooner as needed (especially if she develops instability or other concerning symptoms).    Coy Saunas, MD 03/28/2013, 11:24 AM

## 2013-03-28 NOTE — Progress Notes (Signed)
Per Dr. Mort Sawyers request, this MSW met with pt in clinic who stated that she is going to be homeless soon due to a landlord change and is currently in HUD housing but there are no HUD units available at this time. Pt is very worried about being homeless and has sent her daughter to live with her sister in [place] temporarily. Pt is upset and concerned about finding another place to live and financially paying for it. This MSW suggested pt contact [facility], [place] [facility] and [facility] as pt is disabled and in need of legal representation as she is needing to find out what her legal rights are for this housing situation. This MSW provided contact information for all above agencies. Pt had no further questions or concerns at this time.     Kelilah Hebard was provided with information about the following community resources:  Disability resources:  disability advocates    ,Lucinda Spells was provided with information about the following community resources:  Homeless resources

## 2013-03-29 ENCOUNTER — Ambulatory Visit
Admission: RE | Admit: 2013-03-29 | Discharge: 2013-03-29 | Disposition: A | Discharge status: Home / Self Care | Payer: MEDICAID | Source: Ambulatory Visit | Attending: Family Medicine | Admitting: Family Medicine

## 2013-03-29 ENCOUNTER — Ambulatory Visit (HOSPITAL_BASED_OUTPATIENT_CLINIC_OR_DEPARTMENT_OTHER): Payer: MEDICAID | Admitting: Family Medicine

## 2013-03-29 ENCOUNTER — Encounter (INDEPENDENT_AMBULATORY_CARE_PROVIDER_SITE_OTHER): Payer: Self-pay | Admitting: Family Medicine

## 2013-03-29 VITALS — BP 138/84 | HR 90 | Temp 96.2°F | Ht 65.0 in | Wt 207.7 lb

## 2013-03-29 DIAGNOSIS — R21 Rash and other nonspecific skin eruption: Secondary | ICD-10-CM

## 2013-03-29 DIAGNOSIS — K219 Gastro-esophageal reflux disease without esophagitis: Secondary | ICD-10-CM

## 2013-03-29 DIAGNOSIS — Z23 Encounter for immunization: Secondary | ICD-10-CM

## 2013-03-29 DIAGNOSIS — Z205 Contact with and (suspected) exposure to viral hepatitis: Secondary | ICD-10-CM

## 2013-03-29 DIAGNOSIS — L301 Dyshidrosis [pompholyx]: Secondary | ICD-10-CM

## 2013-03-29 DIAGNOSIS — Z20828 Contact with and (suspected) exposure to other viral communicable diseases: Secondary | ICD-10-CM

## 2013-03-29 MED ORDER — SUCRALFATE 100 MG/ML ORAL SUSPENSION
1.0000 g | Freq: Four times a day (QID) | ORAL | Status: DC
Start: 2013-03-29 — End: 2013-09-19

## 2013-03-29 MED ORDER — CLOBETASOL 0.05 % TOPICAL OINTMENT
TOPICAL_OINTMENT | Freq: Two times a day (BID) | CUTANEOUS | Status: DC
Start: 2013-03-29 — End: 2014-08-19

## 2013-03-29 NOTE — Progress Notes (Signed)
SUBJECTIVE:  [name] is a 57 y.o. female is here today for Acid Reflux and Life Style Modification      1. Rash on hands feet flared up again, had improved with clobetasol once daily in the past but not this time  Avoiding contact irritants, uses soap for sensitive skin, no new meds, followed with Derm last seen Feb 14    2.  Housing unstable, issue with her HUD housing and in a dispute and may be evicted this week  Has several people to call    3.  Daughter is ill with viral hepatitis b--sounds fulminant  Wants to be tested      I have reviewed the patient's medical, surgical, family and social history in detail today, 03/29/2013  and updated the computerized patient record as appropriate.  has a current medication list which includes the following prescription(s): aspirin, atorvastatin, bupropion, clobetasol, fluoxetine, fluticasone, gabapentin, halobetasol propionate, minocycline, nitroglycerin, nystatin, sucralfate, and triamcinolone acetonide.   ROS: as above  OBJECTIVE:   Vitals: BP 138/84  Pulse 90  Temp(Src) 35.7 C (96.2 F)  Ht 1.651 m (5\' 5" )  Wt 94.2 kg (207 lb 10.8 oz)  BMI 34.56 kg/m2   Appearance:tearful   Exam: Lungs: clear to auscultation bilaterally, normal percussion bilaterally  Heart: regular rate and rhythm, S1, S2 normal, no murmur, click, rub or gallop  Skin: hands with scattered erythematous lesions on her palms and soles    ASSESSMENT:   PLAN:    ICD-9-CM    1. Dyshydrosis 705.81 AMB CONSULT/REFERRAL DERMATOLOGY  She can use clobetasol bid     2. Exposure to hepatitis B V01.79 HEPATITIS B SURFACE ANTIGEN   3. Rash of hands 782.1    4. Rash 782.1 Clobetasol (TEMOVATE) 0.05 % Ointment   5. GERD not well controlled 530.81 sucralfate (CARAFATE) 100 mg/mL Oral Suspension   6. Need for pneumococcal vaccination V03.82 PNEUMOCOCCAL POLYVALENT VACCINE(PNEUMOVAX)(ADMIN)     Return in about 2 months (around 05/29/2013) for annual wellness.        Advised to call back directly if there  are further questions, or if these symptoms fail to improve as anticipated or worsen.  Theola Sequin, MD  FAMILY MEDICINE-[facility]  Operated by [facility]  [address]  [place] [address]  Dept: [phone]  Dept Fax: [phone]

## 2013-03-30 LAB — HEPATITIS B SURFACE ANTIGEN: HEPATITIS B SURFACE AG: NEGATIVE

## 2013-04-02 ENCOUNTER — Ambulatory Visit: Payer: MEDICAID | Attending: Dermatology | Admitting: Dermatology

## 2013-04-02 DIAGNOSIS — L301 Dyshidrosis [pompholyx]: Secondary | ICD-10-CM | POA: Insufficient documentation

## 2013-04-02 DIAGNOSIS — D239 Other benign neoplasm of skin, unspecified: Secondary | ICD-10-CM | POA: Insufficient documentation

## 2013-04-02 MED ORDER — TRIAMCINOLONE ACETONIDE 0.1 % TOPICAL OINTMENT
TOPICAL_OINTMENT | Freq: Two times a day (BID) | CUTANEOUS | Status: DC
Start: 2013-04-02 — End: 2015-07-29

## 2013-04-02 NOTE — Progress Notes (Addendum)
Subjective:     Patient ID: [name] is a 57 y.o. female     Chief Complaint:     Chief Complaint   Patient presents with   . 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 She has a history of 1+ positive patch test to thimerosal and potassium dichromate. She reports noticing a flare since about 2-3 weeks ago after she stopped using Clobetasol ointment. She attributes the flare to her significant stressors in life within the past few months. She restarted using Clobetasol ointment twice daily with some improvement. She reports using all unscented products. She has been in the process of moving recently and uses rubber gloves. She otherwise denies any new, changing, bleeding, or rapidly growing lesions and has no other skin-related complaints.    Review of Systems   Skin: Negative for itching and rash.     Current Outpatient Prescriptions   Medication Sig   . atorvastatin (LIPITOR) 20 mg Oral Tablet Take 1 Tab (20 mg total) by mouth Every evening   . buPROPion (WELLBUTRIN XL) 300 mg Oral Tablet Sustained Release 24 hr Take 1 Tab (300 mg total) by mouth Every morning   . Clobetasol (TEMOVATE) 0.05 % Ointment Apply topically Twice daily To rash on hands and feet   . FLUoxetine (PROZAC) 20 mg Oral Tablet Take 1 Tab (20 mg total) by mouth Once a day Tablet necessary, allergy to dye in capsule.   . fluticasone (FLONASE) 50 mcg/actuation Nasal Spray, Suspension 2 Sprays by Each Nostril route Once a day.   . gabapentin (NEURONTIN) 300 mg Oral Capsule Take 2 Caps (600 mg total) by mouth Three times a day for 30 days   . Halobetasol Propionate (ULTRAVATE) 0.05 % Apply externally Cream Apply  topically. Three times a week    . Minocycline (MINOCIN) 100 mg Oral Capsule Take 1 Cap (100 mg total) by mouth Once a day. Use for 10 days if need for recurrent boils   . nitroglycerin (NITROSTAT) 0.4 mg Sublingual Tablet, Sublingual 1 Tab (0.4 mg total) by Sublingual route Every 5 minutes as needed for Chest pain for 3 doses over 15 minutes   . nystatin (NYSTOP) 100,000 unit/gram Apply externally Powder Apply once or twice daily to PREVENT rash under breasts   . sucralfate (CARAFATE) 100 mg/mL Oral Suspension Take 10 mL (1 g total) by mouth Every 6 hours   . triamcinolone acetonide (ARISTOCORT A) 0.1 % Ointment by Apply Topically route Twice daily     Objective:   Marland Kitchen  Blood pressure 126/74, height 1.664 m (5' 5.51"), weight 95.8 kg (211 lb 3.2 oz).  Physical Exam   Constitutional: She appears well-developed and well-nourished. No distress.   HENT:   Head: Normocephalic and atraumatic.   Neurological: She is alert.   Skin: Skin is warm and dry. No rash noted. She is not diaphoretic. No erythema. No pallor.          General skin exam was performed including head, neck, anterior/posterior trunk, bilateral upper, and lower extremities and revealed no areas of concern other than those documented.    Assessment & Plan:     1. History of dyshidrotic eczema (see #1 on body diagram): with recent flare  - Continue Clobetasol ointment BID  for 1 more week then switch to Triamcinolone ointment as needed. Refill sent.. Advised patient about skin thinning side effects. Moisturize with Vaseline and wear white cotton gloves under the rubber gloves. Continue all unscented products.    2. Benign appearing nevus (see #2 on body diagram)  - Will follow. Recommended monthly self-evaluation, photoprotection, and return to clinic with any new or changing moles.    Return to clinic in 6 months or sooner if needed or with any new or changing skin lesions. Perform monthly self exam and practice sun protection.    Troy Sine, MD       I saw and examined the patient.  I reviewed the resident's note.  I agree with the findings and plan of care as documented in the resident's note.  Any exceptions/additions are edited/noted.    Rudene Christians, MD 04/02/2013, 12:47 PM

## 2013-04-13 ENCOUNTER — Ambulatory Visit (HOSPITAL_COMMUNITY): Payer: Self-pay | Admitting: Psychiatry

## 2013-04-13 ENCOUNTER — Ambulatory Visit (INDEPENDENT_AMBULATORY_CARE_PROVIDER_SITE_OTHER): Payer: MEDICAID | Admitting: Psychiatry

## 2013-04-13 MED ORDER — FLUOXETINE 20 MG TABLET
20.0000 mg | ORAL_TABLET | Freq: Every day | ORAL | Status: DC
Start: 2013-04-13 — End: 2013-07-09

## 2013-04-13 MED ORDER — BUPROPION HCL XL 150 MG 24 HR TABLET, EXTENDED RELEASE
150.00 mg | ORAL_TABLET | Freq: Every morning | ORAL | Status: DC
Start: 2013-04-13 — End: 2013-07-09

## 2013-04-13 NOTE — Telephone Encounter (Signed)
Case manager called patient back and scheduled her to see Dr. Karie Fetch today at 2:30 pm.  Case manager researched who to contact regarding [employer] housing issues and gave patient phone number for the governor's office.  04/13/2013  [name] [name], DISCHARGE [address]

## 2013-04-13 NOTE — Telephone Encounter (Signed)
Message copied by Creta Levin on Fri Apr 13, 2013  3:50 PM  ------       Message from: ENOFF, LINDA       Created: Fri Apr 13, 2013  1:34 PM         >> LINDA ENOFF 04/13/2013 01:34 PM       [name] and Dr. Alferd Patee               Pt was last seen on 10.28.14               Pt called crying , very upset - said her housing complex breeched some kind of agreement and she had to get out and currently she is homeless.    ------

## 2013-04-13 NOTE — Progress Notes (Addendum)
[name]  [id number]  10-Aug-1956  04/13/2013    Chief Complaint: depression    Subjective:  This is a patient of Dr. Alferd Patee seen today for a one-time visit.  Patient reports that her problems began about 3-4 months ago when her daughter became homeless and moved in with her.  Though a series of events with the company she rents her apartment from, she was evicted and the company says she breached their contract.  In addition, her daughter is having serious health problems and is currently hospitalized in [place].  Her daughter may need a liver transplant.  The patient has been staying with a friend.  The patient reports she has not been taking her psychiatric medication and feels she needs to resume medication.  She reports passive SI only, no active SI.  She denies any HI or AVH.  She reports Prozac and Wellbutrin were effective for her and denies any side effects of these medications.  Therapy is not feasible for her at this time due to her financial situation.      Past Medical History:  Patient denies any significant medical problems.  No history of seizures.    Per EMR:  rectocele repair, bilateral knee arthritis, neck mass, GERD, pending r/o H. Pylori, history of multinodular goiter, migraines    Current Medications:    None aside from use of a cream used on hands and feet for eczema     Objective:    Mental Status Examination  Appearance: Casually dressed, emotionally distressed   Behavior: Pleasant and cooperative  Level of consciousness: Alert  Orientation: Grossly normal  Motor: No movement abnormalities noted  Speech: Regular rate, rhythm, and volume  Mood: depressed  Affect: congruent with mood, tearful at times   Thought process: Linear and goal-directed  Thought content: Appropriate to interview   Perception: does not appear to be responding to internal stimuli   Suicide ideation: Passive SI only, no active SI   Homicide ideation: None        Assessment:  Major Depressive Disorder     Plan:    Restart Prozac 20 mg daily and Wellbutrin XL 150 mg daily (previous dose was 300 mg daily).  Advised patient to stagger restarting these medications by at least a week.  She expressed understanding and agreement with this plan.  Consider therapy when financially feasible.  As patient's provider is graduating, she will RTC in about one month with PGY-1.      Casimer Bilis, MD 04/18/2013, 8:49 PM      I saw and examined the patient.  I reviewed the resident's note.  I agree with the findings and plan of care as documented in the resident's note.  Any exceptions/additions are edited/noted.    Derek Mound, MD 04/25/2013, 12:16 PM

## 2013-04-17 ENCOUNTER — Ambulatory Visit: Payer: MEDICAID | Attending: Neurology | Admitting: Neurology

## 2013-04-17 ENCOUNTER — Encounter (INDEPENDENT_AMBULATORY_CARE_PROVIDER_SITE_OTHER): Payer: Self-pay | Admitting: Neurology

## 2013-04-17 VITALS — BP 124/80 | HR 88 | Ht 65.5 in | Wt 209.2 lb

## 2013-04-17 DIAGNOSIS — M531 Cervicobrachial syndrome: Secondary | ICD-10-CM | POA: Insufficient documentation

## 2013-04-17 MED ORDER — GABAPENTIN 600 MG TABLET
600.00 mg | ORAL_TABLET | Freq: Three times a day (TID) | ORAL | Status: DC
Start: 2013-04-17 — End: 2013-09-19

## 2013-04-17 NOTE — Addendum Note (Signed)
 Addended by: LATANYA BRUCKNER on: 04/17/2013 03:16 PM     Modules accepted: Level of Service

## 2013-04-17 NOTE — Progress Notes (Addendum)
[name]      Operated by [facility]  Return Patient  Date:  04/17/2013  Age:  57 y.o.  Referring Physician:   Coy Saunas, MD  [address]  [address]  [place], [place] [address]    Subjective:   Has no abnormal paresthesias x 4 weeks.  Has gone up on the Neurontin due to concurrent left knee problems.  Tolerating Neurontin without side effects.    Objective:   Vital Signs:  BP 124/80   Pulse 88   Ht 1.664 m (5' 5.5")   Wt 94.9 kg (209 lb 3.5 oz)   BMI 34.27 kg/m2  General: appears in good health  Ophthalomscopic: normal w/o hemorrhages, exudates, or papilledema  Carotids: Carotids normal without bruit  Orientation: Alert and oriented x 3  Memory: Registration, Recall, and Following of commands is normal  Attention: Attention and Concentration are normal  Knowledge: Good  Language: Normal  Speech: Normal  Cranial nerves: Cranial nerves 2-12 are normal  Gait: Normal  Coordination: Coordination is normal without tremor  Sensory: Sensory exam in the upper and lower extremities is normal  Muscle tone: Upper and lower muscle tone is normal  Motor strength:  Motor strength is normal throughout.  Reflexes: Reflexes are 2/2 throughout          Assessment and Plan:    1. Occipital neuralgia      Orders Placed This Encounter   . 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  Will continue Neurontin 600 mg TID x 6 months and if patient continues to do well will plan for taper at that point.    RTC 6 months  [name], MD 04/17/2013, 1:54 PM        I saw and examined the patient.  I reviewed the resident's note.  I agree with the findings and plan of care as documented in the resident's note.  Any exceptions/additions are edited/noted.    [name], MD 04/17/2013, 3:16 PM

## 2013-05-02 ENCOUNTER — Ambulatory Visit (HOSPITAL_BASED_OUTPATIENT_CLINIC_OR_DEPARTMENT_OTHER): Payer: MEDICAID

## 2013-05-02 ENCOUNTER — Ambulatory Visit
Admission: RE | Admit: 2013-05-02 | Discharge: 2013-05-02 | Disposition: A | Discharge status: Home / Self Care | Payer: MEDICAID | Source: Ambulatory Visit

## 2013-05-02 VITALS — BP 104/76 | HR 88 | Temp 97.4°F | Wt 208.3 lb

## 2013-05-02 DIAGNOSIS — K5732 Diverticulitis of large intestine without perforation or abscess without bleeding: Secondary | ICD-10-CM

## 2013-05-02 DIAGNOSIS — K315 Obstruction of duodenum: Secondary | ICD-10-CM | POA: Insufficient documentation

## 2013-05-02 DIAGNOSIS — R300 Dysuria: Secondary | ICD-10-CM

## 2013-05-02 MED ORDER — METRONIDAZOLE 500 MG TABLET
500.00 mg | ORAL_TABLET | Freq: Three times a day (TID) | ORAL | Status: DC
Start: 2013-05-02 — End: 2013-05-30

## 2013-05-02 MED ORDER — CIPROFLOXACIN 500 MG TABLET
500.00 mg | ORAL_TABLET | Freq: Two times a day (BID) | ORAL | Status: DC
Start: 2013-05-02 — End: 2013-05-30

## 2013-05-02 MED ORDER — HYDROCODONE 5 MG-ACETAMINOPHEN 325 MG TABLET
1.00 | ORAL_TABLET | ORAL | Status: DC | PRN
Start: 2013-05-02 — End: 2013-05-30

## 2013-05-02 NOTE — Progress Notes (Signed)
Subjective:     Patient ID:  [name] is an 57 y.o. female   Chief Complaint:    Chief Complaint   Patient presents with   . Urinary Tract Infection     feels like has to urinate and can't; urine is really yellow; abdominal pain; started last night       HPI  57 yo female here for an acute visit with complaint of difficulty urinating and dysuria since last night.  Has pain in the lower left abdomen which occurs when she needs to void or have a BM.  No fever, but has some cold chills.  Hard to tell sometimes if chills and sweats are because of illness or because she is also menopausal.  Urine looks yellow.  Has been having trouble getting more than a drop or two of urine.   Having a lot of pain with urination.  Feels a sharp pain at the start or urination and then feels like a spasm.  Has similar sharp pain followed by spasm when moving bowels - denies diarrhea or constipation. No change in diet.   Has been pushing fluids today.  History of a "kidney infection" a long time ago and can't remember if this is anything like that.    Hasn't been taking anything for pain. Says she wasn't sure what to take as Dr. Luan Pulling had taken her off aspirin.    Patient called in for an acute visit at 10:44 am today and was told there were no openings until this 5 pm appointment.    Review of Systems   Constitutional: Positive for chills, malaise/fatigue and diaphoresis. Negative for fever.   HENT: Negative for congestion and sore throat.    Respiratory: Negative for cough and shortness of breath.    Cardiovascular: Negative for chest pain and palpitations.   Gastrointestinal: Positive for abdominal pain. Negative for nausea, vomiting, diarrhea and constipation.   Genitourinary: Positive for dysuria and urgency. Negative for hematuria and flank pain.   Skin: Negative for itching and rash.   Neurological: Negative for headaches.      Objective:  BP 104/76   Pulse 88   Temp(Src) 36.3 C (97.4 F) (Thermal Scan)   Wt 94.5 kg (208 lb 5.4 oz)   BMI 34.13 kg/m2     Physical Exam   Constitutional: She is oriented to person, place, and time. She appears well-developed and well-nourished. She appears distressed.   Appears mildly ill but nontoxic.     Neck: Neck supple. No thyromegaly present.   Cardiovascular: Normal rate, regular rhythm and normal heart sounds.    Pulm:  Effort normal and breath sounds normal.  Observations:  no respiratory distress.    Abdomen:   Bowel sounds are normal. Soft. Tenderness is present.   Tender in LLQ to palpation.     Lymphadenopathy:     She has no cervical adenopathy.   Neurological: She is alert and oriented to person, place, and time.   Skin: Skin is warm and dry.   Psychiatric: She has a normal mood and affect. Her behavior is normal.     .    Assessment & Plan:       ICD-9-CM    1. Diverticulitis 562.11 ciprofloxacin (CIPRO) 500 mg Oral Tablet     metroNIDAZOLE (FLAGYL) 500 mg Oral Tablet     HYDROcodone-acetaminophen (NORCO) 5-325 mg Oral Tablet  Exam most consistent with diverticulitis.  Is not vomiting and no peritoneal signs, so will treat  as outpatient.  To call back if not improving in 48 hours. Should return to clinic or ED if symptoms progress.   2. Dysuria 788.1 POCT URINE DIPSTICK     ciprofloxacin (CIPRO) 500 mg Oral Tablet     URINE CULTURE  UA looks clear but will evaluate culture.  Likely bladder issues and spasm are related to inflammation in the colon.  Discussed with patient and she expresses understanding.      Follow up 48 hours if not improving and prn if worse. Otherwise, keep regular appointment with Dr. Luan Pulling.    Barbara Alms Neiman-Hart,MD

## 2013-05-02 NOTE — Progress Notes (Signed)
 05/02/13 1700   Urine test  (Siemens Multistix 10 SG)   Time collected 1713   Color Yellow   Clarity Clear   Glucose Negative   Bilirubin Negative   Ketones Negative   Specific Gravity 1.020   Blood (urine) Trace   pH 7.0   Protein Negative   Urobilinogen Negative   Nitrite Negative   Leukocytes Negative   Bottle Number   (Siemens Multistix 10 SG) [id number]   Lot # [id number]   Expiration Date 09/23/13   Initials melliott

## 2013-05-04 LAB — URINE CULTURE

## 2013-05-06 ENCOUNTER — Encounter (INDEPENDENT_AMBULATORY_CARE_PROVIDER_SITE_OTHER): Payer: Self-pay

## 2013-05-07 ENCOUNTER — Ambulatory Visit
Admission: RE | Admit: 2013-05-07 | Discharge: 2013-05-07 | Disposition: A | Discharge status: Home / Self Care | Payer: MEDICAID | Source: Ambulatory Visit | Attending: Specialist | Admitting: Specialist

## 2013-05-07 ENCOUNTER — Ambulatory Visit (HOSPITAL_BASED_OUTPATIENT_CLINIC_OR_DEPARTMENT_OTHER): Payer: MEDICAID | Admitting: Specialist

## 2013-05-07 ENCOUNTER — Encounter (INDEPENDENT_AMBULATORY_CARE_PROVIDER_SITE_OTHER): Payer: Self-pay | Admitting: Specialist

## 2013-05-07 VITALS — BP 114/70 | HR 80 | Temp 97.2°F | Wt 207.7 lb

## 2013-05-07 DIAGNOSIS — K219 Gastro-esophageal reflux disease without esophagitis: Secondary | ICD-10-CM | POA: Insufficient documentation

## 2013-05-07 DIAGNOSIS — N200 Calculus of kidney: Secondary | ICD-10-CM | POA: Insufficient documentation

## 2013-05-07 DIAGNOSIS — K5732 Diverticulitis of large intestine without perforation or abscess without bleeding: Secondary | ICD-10-CM | POA: Insufficient documentation

## 2013-05-07 DIAGNOSIS — I2510 Atherosclerotic heart disease of native coronary artery without angina pectoris: Secondary | ICD-10-CM | POA: Insufficient documentation

## 2013-05-07 DIAGNOSIS — F3289 Other specified depressive episodes: Secondary | ICD-10-CM | POA: Insufficient documentation

## 2013-05-07 DIAGNOSIS — Z87891 Personal history of nicotine dependence: Secondary | ICD-10-CM | POA: Insufficient documentation

## 2013-05-07 DIAGNOSIS — G5600 Carpal tunnel syndrome, unspecified upper limb: Secondary | ICD-10-CM | POA: Insufficient documentation

## 2013-05-07 NOTE — Progress Notes (Signed)
 05/07/13 1600   Urine test  (Siemens Multistix 10 SG)   Time collected 1629   Color Yellow   Clarity Clear   Glucose Negative   Bilirubin Negative   Ketones Negative   Specific Gravity 1.025   Blood (urine) Trace Hemolyzed   pH 5.0   Protein Negative   Urobilinogen Normal    Nitrite Negative   Leukocytes Negative   Bottle Number   (Siemens Multistix 10 SG) [id number]   Lot # [id number]   Expiration Date 09/23/13   Initials lw

## 2013-05-07 NOTE — Progress Notes (Signed)
Chief Complaint   Patient presents with   . Back Pain   . Side Pain   . Medication Reaction     pt stated antibiotics making her sick       Subjective:  [name] is a 57 y.o. female presenting with LLQ pain.   - pt was treated for diverticulitis with abx on July 9 - saw Dr. Parke Simmers  - now pain radiates to back from LLQ:  began yesterday    = denies dysuria or frank blood in urine  - has not had good BM since starting abx:  states narrow caliber of stools - reports that she has been taking HCDN/APAP as rx'd last visit    = has not used OTC remedies for constipation  - nausea and vomiting since starting abx    = able to hold down food - drinking a lot of water  - consulted Dr. Parke Simmers last night and was instructed to come back     Reviewed with patient this appt: Medications, Personal and Family Medical histories, as well as Social history reviewed with patient during this encounter      Review of Systems:  General/Constitutional:  denies fever/chills, denies fatigue, denies unexplained change in weight  HEENT:  denies headache, denies vision changes, denies hearing changes, denies sore throat  Lymphatics:  denies swollen glands  Cardiovascular:  denies chest pain, denies palpitations  Pulmonary:  denies SOB, denies cough  Gastrointestinal:  reports LLQ abdominal pain radiating to the left side of back, denies nausea/vomiting, denies diarrhea, denies constipation - but does report smaller caliber stools since starting abx  Genitourinary:  denies dysuria, denies frequent urination, denies discharge, reports L CVA tenderness  Musculoskeletal:  denies myalgias, denies arthralgias  Skin:  denies rash  Extremities:  denies edema  Neurologic:  denies weakness, numbness, or tingling  Musculoskeletal:  denies injuries    Psychiatric:  denies anxiety or depression      Outpatient Prescriptions Marked as Taking for the 05/07/13 encounter (Office Visit) with Emelia Loron, MD    Medication Sig Dispense Refill   . atorvastatin (LIPITOR) 20 mg Oral Tablet Take 1 Tab (20 mg total) by mouth Every evening  30 Tab  11   . buPROPion (BUPEPRION XL) 150 mg Oral Tablet Sustained Release 24 hr Take 1 Tab (150 mg total) by mouth Every morning  30 Tab  1   . ciprofloxacin (CIPRO) 500 mg Oral Tablet Take 1 Tab (500 mg total) by mouth Twice daily  20 Tab  0   . Clobetasol (TEMOVATE) 0.05 % Ointment Apply topically Twice daily To rash on hands and feet  60 g  0   . FLUoxetine (PROZAC) 20 mg Oral Tablet Take 1 Tab (20 mg total) by mouth Once a day Tablet necessary, allergy to dye in capsule.  30 Tab  1   . fluticasone (FLONASE) 50 mcg/actuation Nasal Spray, Suspension 2 Sprays by Each Nostril route Once a day.  16 g  11   . Gabapentin (NEURONTIN) 600 mg Oral Tablet Take 1 Tab (600 mg total) by mouth Three times a day  90 Tab  5   . Halobetasol Propionate (ULTRAVATE) 0.05 % Apply externally Cream Apply  topically. Three times a week  15 g  2   . HYDROcodone-acetaminophen (NORCO) 5-325 mg Oral Tablet Take 1 Tab by mouth Every 4 hours as needed for Pain  30 Tab  0   . metroNIDAZOLE (FLAGYL) 500 mg Oral Tablet Take  1 Tab (500 mg total) by mouth Three times a day  20 Tab  0   . Minocycline (MINOCIN) 100 mg Oral Capsule Take 1 Cap (100 mg total) by mouth Once a day. Use for 10 days if need for recurrent boils  10 Cap  0   . nitroglycerin (NITROSTAT) 0.4 mg Sublingual Tablet, Sublingual 1 Tab (0.4 mg total) by Sublingual route Every 5 minutes as needed for Chest pain for 3 doses over 15 minutes  30 Tab  5   . nystatin (NYSTOP) 100,000 unit/gram Apply externally Powder Apply once or twice daily to PREVENT rash under breasts  60 g  11   . sucralfate (CARAFATE) 100 mg/mL Oral Suspension Take 10 mL (1 g total) by mouth Every 6 hours  420 mL  2   . triamcinolone acetonide (ARISTOCORT A) 0.1 % Ointment by Apply Topically route Twice daily  80 g  2      No Facility-Administered Medications for the 05/07/13 encounter (Office Visit) with Emelia Loron, MD.         Allergies:  Allergies   Allergen Reactions   . Cymbalta (Duloxetine)      Rash on feet and hands   . Blue Dye      Told to avoid due to patch test result by Abundio Miu   . Codeine Itching     RASH   . Mobic (Meloxicam) Nausea/ Vomiting   . Nickel    . Naprosyn (Naproxen) Nausea/ Vomiting         Past Medical History/Active Problems:  Patient Active Problem List   Diagnosis   . Migraine Headaches   . GERD not well controlled   . Depression   . Patellofemoral pain syndrome   . Multinodular goiter   . Microscopic hematuria   . Neck pain   . Well woman exam   . Lichen sclerosus et atrophicus of the vulva   . Carpal tunnel syndrome on right   . Osteoarthritis, knee   . Left ankle pain   . Left knee pain   . Chest pain at rest   . MINOR CAD (coronary artery disease)   . Diverticulitis         Social History:  History   Substance Use Topics   . Smoking status: Former Smoker -- 2.00 packs/day for 40 years     Types: Cigarettes     Quit date: 10/14/2005   . Smokeless tobacco: Never Used      Comment: quit 2007   . Alcohol Use: Yes      Comment: 1 glass wine/month         Physical Exam:  BP 114/70   Pulse 80   Temp(Src) 36.2 C (97.2 F) (Thermal Scan)   Wt 94.2 kg (207 lb 10.8 oz)   BMI 34.02 kg/m2  General: well-developed, well-nourished female in moderate acute distress due to pain  Eyes: PERRL, EOMI  ENT: oropharynx unremarkable, mucous membranes moist, neck supple  Cardiovascular: regular rate and rhythm, no murmur/gallop/rub  Pulmonary: clear to auscultation bilaterally, no rales/rhonchi/wheezing  Gastrointestinal: abdomen soft, tender LLQ to palpation, non-distended, no hepatomegaly, no splenomegaly  Genitourinary: L CVA tenderness to palpation, no suprapubic tenderness  Lymphatics: no cervical lymphadenopathy  Neurologic: no focal deficit  Musculoskeletal:  5/5 power, FROM  Skin: no apparent rash    Extremities: no edema, no cyanosis or clubbing  Psychiatric: normal affect, awake, alert, appropriate      Assessment/Plan:  (562.11) Diverticulitis  (primary  encounter diagnosis)  Plan: POCT URINE DIPSTICK, XR KUB AND UPRIGHT ABDOMEN        - POCT urine shows some blood        - check KUB to rule out stool retention (likely due to HCDN/APAP) that may be worsening diverticulitis sx    (564.00) Constipation  Plan: POCT URINE DIPSTICK, XR KUB AND UPRIGHT ABDOMEN        - KUB to rule out stool retention        - copious PO fluids        - take daily until re-evaluated:  Senokot-S and Miralax OTC BID        - take BID q2 days for persistent small caliber stool:  Milk of Magnesia (30cc):  May stop after normal-caliber BM achieved    (592.0) Renal stone  Plan: POCT URINE DIPSTICK, XR KUB AND UPRIGHT ABDOMEN        - suspected, based on L CVA tenderness and trace blood in urine        - check KUB to confirm, may need U/S to rule out radiolucent stones      RTC end of week for re-evaluation by first available provider.      Emelia Loron, MD 05/07/2013, 4:11 PM  Assistant Professor  [facility]  Donnetta Hail [facility]  Department of [facility]

## 2013-05-11 ENCOUNTER — Ambulatory Visit: Payer: MEDICAID | Attending: Specialist | Admitting: Specialist

## 2013-05-11 VITALS — BP 120/76 | HR 78 | Temp 96.0°F | Wt 206.8 lb

## 2013-05-11 DIAGNOSIS — Z87891 Personal history of nicotine dependence: Secondary | ICD-10-CM | POA: Insufficient documentation

## 2013-05-11 DIAGNOSIS — K5900 Constipation, unspecified: Secondary | ICD-10-CM | POA: Insufficient documentation

## 2013-05-11 DIAGNOSIS — I2510 Atherosclerotic heart disease of native coronary artery without angina pectoris: Secondary | ICD-10-CM | POA: Insufficient documentation

## 2013-05-11 NOTE — Progress Notes (Signed)
Chief Complaint   Patient presents with   . X-Ray Results       Subjective:  [name] is a 57 y.o. female presenting for follow up visit.   - abdominal pain not as bad as before but pt has not had a decent BM    = taking Miralax, senokot-s, MOM, lots of water with little relief    = feels full after meals  - KUB result discussed    Reviewed with patient this appt: Medications, Personal and Family Medical histories, as well as Social history reviewed with patient during this encounter      Review of Systems:  General/Constitutional:  denies fever/chills, denies fatigue, denies unexplained change in weight  HEENT:  denies headache, denies vision changes, denies hearing changes, denies sore throat  Lymphatics:  denies swollen glands  Cardiovascular:  denies chest pain, denies palpitations  Pulmonary:  denies SOB, denies cough  Gastrointestinal:  reports improving LLQ abdominal pain, denies nausea/vomiting, denies diarrhea, slowly-improving constipation  Genitourinary:  denies dysuria, denies frequent urination, denies discharge  Musculoskeletal:  denies myalgias, denies arthralgias  Skin:  denies rash  Extremities:  denies edema  Neurologic:  denies weakness, numbness, or tingling  Musculoskeletal:  denies injuries    Psychiatric:  denies anxiety or depression      Outpatient Prescriptions Marked as Taking for the 05/11/13 encounter (Office Visit) with Emelia Loron, MD   Medication Sig Dispense Refill   . atorvastatin (LIPITOR) 20 mg Oral Tablet Take 1 Tab (20 mg total) by mouth Every evening  30 Tab  11   . buPROPion (BUPEPRION XL) 150 mg Oral Tablet Sustained Release 24 hr Take 1 Tab (150 mg total) by mouth Every morning  30 Tab  1   . ciprofloxacin (CIPRO) 500 mg Oral Tablet Take 1 Tab (500 mg total) by mouth Twice daily  20 Tab  0   . Clobetasol (TEMOVATE) 0.05 % Ointment Apply topically Twice daily To rash on hands and feet  60 g  0    . FLUoxetine (PROZAC) 20 mg Oral Tablet Take 1 Tab (20 mg total) by mouth Once a day Tablet necessary, allergy to dye in capsule.  30 Tab  1   . fluticasone (FLONASE) 50 mcg/actuation Nasal Spray, Suspension 2 Sprays by Each Nostril route Once a day.  16 g  11   . Gabapentin (NEURONTIN) 600 mg Oral Tablet Take 1 Tab (600 mg total) by mouth Three times a day  90 Tab  5   . Halobetasol Propionate (ULTRAVATE) 0.05 % Apply externally Cream Apply  topically. Three times a week  15 g  2   . HYDROcodone-acetaminophen (NORCO) 5-325 mg Oral Tablet Take 1 Tab by mouth Every 4 hours as needed for Pain  30 Tab  0   . metroNIDAZOLE (FLAGYL) 500 mg Oral Tablet Take 1 Tab (500 mg total) by mouth Three times a day  20 Tab  0   . Minocycline (MINOCIN) 100 mg Oral Capsule Take 1 Cap (100 mg total) by mouth Once a day. Use for 10 days if need for recurrent boils  10 Cap  0   . nitroglycerin (NITROSTAT) 0.4 mg Sublingual Tablet, Sublingual 1 Tab (0.4 mg total) by Sublingual route Every 5 minutes as needed for Chest pain for 3 doses over 15 minutes  30 Tab  5   . nystatin (NYSTOP) 100,000 unit/gram Apply externally Powder Apply once or twice daily to PREVENT rash under breasts  60 g  11   . sucralfate (CARAFATE) 100 mg/mL Oral Suspension Take 10 mL (1 g total) by mouth Every 6 hours  420 mL  2   . triamcinolone acetonide (ARISTOCORT A) 0.1 % Ointment by Apply Topically route Twice daily  80 g  2     No Facility-Administered Medications for the 05/11/13 encounter (Office Visit) with Emelia Loron, MD.         Allergies:  Allergies   Allergen Reactions   . Cymbalta (Duloxetine)      Rash on feet and hands   . Blue Dye      Told to avoid due to patch test result by Abundio Miu   . Codeine Itching     RASH   . Mobic (Meloxicam) Nausea/ Vomiting   . Nickel    . Naprosyn (Naproxen) Nausea/ Vomiting         Past Medical History/Active Problems:  Patient Active Problem List   Diagnosis   . Migraine Headaches    . GERD not well controlled   . Depression   . Patellofemoral pain syndrome   . Multinodular goiter   . Microscopic hematuria   . Neck pain   . Well woman exam   . Lichen sclerosus et atrophicus of the vulva   . Carpal tunnel syndrome on right   . Osteoarthritis, knee   . Left ankle pain   . Left knee pain   . Chest pain at rest   . MINOR CAD (coronary artery disease)   . Diverticulitis         Social History:  History   Substance Use Topics   . Smoking status: Former Smoker -- 2.00 packs/day for 40 years     Types: Cigarettes     Quit date: 10/14/2005   . Smokeless tobacco: Never Used      Comment: quit 2007   . Alcohol Use: Yes      Comment: 1 glass wine/month         Physical Exam:  BP 120/76   Pulse 78   Temp(Src) 35.6 C (96 F)   Wt 93.8 kg (206 lb 12.7 oz)   BMI 33.88 kg/m2  General: well-developed, well-nourished female in no acute distress  Eyes: PERRL, EOMI  ENT: oropharynx  unremarkable, mucous membranes moist, neck supple  Cardiovascular: regular rate and rhythm, no murmur/gallop/rub  Pulmonary: clear to auscultation bilaterally, no rales/rhonchi/wheezing  Gastrointestinal: abdomen soft, trace to mildly tender LLQ, non-distended, no hepatomegaly, no splenomegaly  Genitourinary: denies  Lymphatics: no denies lymphadenopathy  Neurologic: no focal deficit  Musculoskeletal:  denies power, FROM  Skin: denies apparent rash   Extremities: denies edema, denies cyanosis or clubbing  Psychiatric: normal affect, awake, alert, appropriate      Assessment/Plan:  (564.00) Fecal retention  (primary encounter diagnosis)  Plan: continue aggressive laxative therapy at home  - pt to contact [facility] if she wishes to use a more powerful laxative    = conisder golytely in this event    RTC as scheduled routinely with Dr. Jake Seats Stanford [place], MD 05/11/2013, 3:15 PM  Assistant Professor  [facility]  Donnetta Hail [facility]  Department of [facility]

## 2013-05-14 ENCOUNTER — Ambulatory Visit (INDEPENDENT_AMBULATORY_CARE_PROVIDER_SITE_OTHER): Payer: MEDICAID | Admitting: Psychiatry

## 2013-05-14 NOTE — Progress Notes (Addendum)
 Behavioral Medicine and Psychiatry   Outpatient Progress Note      [name]   [id number]  02/23/1956    DOS: 05/14/2013    No chief complaint on file.       SUBJECTIVE: [name] started Prozac  on about 6/30 and started Wellbutrin  the next week. She reports significant improvements since beginning her medication. She is not crying all the time like she had been before starting her meds. She has been just doing my daily routine and has been able to start enjoying activities like going to bingo. She can manage the stressors that felt unmanageable previously. She reports sleeping well, and some improvements in concentration and energy. She feels like so far the meds are doing good and reports no side effects. She denies any SI. She is currently in school for her bachelor of health and human services (major) and criminal justice (minor) with one course left for her major. She has numerous stressors including chronic mental illness, lack of employment/disability, and social support.  Her eldest daughter who was homeless and alcoholic, is now living with her, clean, and under a doctor's care for liver damage. Before that the patient had lived alone for years.     OBJECTIVE:     Most Recent Vitals      Office Visit from 05/11/2013 in [facility]    Temperature 35.6 C (96 F) filed at... 05/11/2013 1448    Heart Rate 78 filed at... 05/11/2013 1448    Respiratory Rate     BP (Non-Invasive) 120/76 mmHg filed at... 05/11/2013 1448    Height     Weight 93.8 kg (206 lb 12.7 oz) filed at... 05/11/2013 1448    BMI (Calculated)     BSA (Calculated)           MENTAL STATUS EXAM:    Orientation: Awake, alert, oriented x 3.   Appearance: Well groomed, dressed neatly in casual clothing, good personal hygiene, no acute distress  Eye Contact: Good   Behavior: pleasant and cooperative   Attention: Good for conversation  Speech: replies briefly to questions, normal volume, rate, and rhythm  Motor: Some psychomotor  retardation  Mood: good  Affect: Consistent with mood. Full range. Smiling throughout conversation   Thought Process: Linear and goal directed   Thought Content: No delusions, preoccupations or phobias   Suicidal Ideation: Denies  Homicidal Ideation: Denies   Perception: No auditory or visual hallucinations   Cognition: Abstract   Insight: Fair   Judgment: Fair    MEDICATIONS:    Outpatient Prescriptions Prior to Visit:  atorvastatin  (LIPITOR) 20 mg Oral Tablet Take 1 Tab (20 mg total) by mouth Every evening   buPROPion  (BUPEPRION XL) 150 mg Oral Tablet Sustained Release 24 hr Take 1 Tab (150 mg total) by mouth Every morning   ciprofloxacin  (CIPRO ) 500 mg Oral Tablet Take 1 Tab (500 mg total) by mouth Twice daily   Clobetasol  (TEMOVATE ) 0.05 % Ointment Apply topically Twice daily To rash on hands and feet   FLUoxetine  (PROZAC ) 20 mg Oral Tablet Take 1 Tab (20 mg total) by mouth Once a day Tablet necessary, allergy to dye in capsule.   fluticasone  (FLONASE ) 50 mcg/actuation Nasal Spray, Suspension 2 Sprays by Each Nostril route Once a day.   Gabapentin  (NEURONTIN ) 600 mg Oral Tablet Take 1 Tab (600 mg total) by mouth Three times a day   Halobetasol  Propionate (ULTRAVATE ) 0.05 % Apply externally Cream Apply  topically. Three times a week  metroNIDAZOLE  (FLAGYL ) 500 mg Oral Tablet Take 1 Tab (500 mg total) by mouth Three times a day       nitroglycerin  (NITROSTAT ) 0.4 mg Sublingual Tablet, Sublingual 1 Tab (0.4 mg total) by Sublingual route Every 5 minutes as needed for Chest pain for 3 doses over 15 minutes   nystatin  (NYSTOP ) 100,000 unit/gram Apply externally Powder Apply once or twice daily to PREVENT rash under breasts   sucralfate  (CARAFATE ) 100 mg/mL Oral Suspension Take 10 mL (1 g total) by mouth Every 6 hours   triamcinolone  acetonide (ARISTOCORT  A) 0.1 % Ointment by Apply Topically route Twice daily     No facility-administered medications prior to visit.    ASSESSMENT:  Axis I: Major Depressive  Disorder  Axis II: none  Axis III: Dermatitis, GERD, CAD, recent kidney infection, diverticulitis  Axis IV: lack of social support, unemployment/disability, and living situation    PLAN:    Medications well tolerated. Continue Prozac  20 mg daily and Wellbutrin  150 mg daily.  Therapy would be useful for patient, but she can not afford the gas to go to it.     Follow up: 2 months    Patient advised to call with any questions or concerns.     Wanda Gaskins, MD 05/14/2013, 3:05 PM      I saw and examined the patient.  I reviewed the resident's note.  I agree with the findings and plan of care as documented in the resident's note.  Any exceptions/additions are edited/noted.    Channing Potters, MD 05/14/2013, 5:20 PM

## 2013-05-16 ENCOUNTER — Ambulatory Visit: Payer: MEDICAID | Attending: ORTHOPEDIC, SPORTS MEDICINE | Admitting: ORTHOPEDIC, SPORTS MEDICINE

## 2013-05-16 VITALS — BP 126/78 | HR 90 | Wt 205.9 lb

## 2013-05-16 DIAGNOSIS — IMO0002 Reserved for concepts with insufficient information to code with codable children: Secondary | ICD-10-CM | POA: Insufficient documentation

## 2013-05-16 DIAGNOSIS — M25569 Pain in unspecified knee: Secondary | ICD-10-CM | POA: Insufficient documentation

## 2013-05-16 NOTE — Progress Notes (Signed)
Name: [name]  MEDICAL RECORD [id number]  DATE OF BIRTH: 11-20-1955  DATE OF SERVICE: 05/16/2013    SUBJECTIVE:  [name] presents to clinic today for follow up of her left knee pain. She reports she is doing better as compared to last visit. She found an old knee brace that fit better, and it has helped with the sensation of instability. The left knee pain mainly is located medially about the patella, relatively constant, worsened to 8/10 with ambulation without the brace, 6/10 with ambulation with the brace. At this point she reports she has not identified certain activities that exacerbate the pain more than others. However, the pain is worse when she rolls over in bed as she does not wear the brace to sleep. 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 She has been taking gabapentin as increased at last visit, and reports now that the radiating aspect of her knee pain has resolved. She reports some swelling but no giving way (though using a brace to aid with this).    PAST MEDICAL HISTORY  Reviewed and updated as appropriate    SOCIAL HISTORY  History   Substance Use Topics   . Smoking status: Former Smoker -- 2.00 packs/day for 40 years     Types: Cigarettes     Quit date: 10/14/2005   . Smokeless tobacco: Never Used      Comment: quit 2007   . Alcohol Use: Yes      Comment: 1 glass wine/month       MEDICATIONS  Outpatient Prescriptions Marked as Taking for the 05/16/13 encounter (Office Visit) with Coy Saunas, MD   Medication Sig Dispense Refill   . atorvastatin (LIPITOR) 20 mg Oral Tablet Take 1 Tab (20 mg total) by mouth Every evening  30 Tab  11   . buPROPion (BUPEPRION XL) 150 mg Oral Tablet Sustained Release 24 hr Take 1 Tab (150 mg total) by mouth Every morning  30 Tab  1    . ciprofloxacin (CIPRO) 500 mg Oral Tablet Take 1 Tab (500 mg total) by mouth Twice daily  20 Tab  0   . Clobetasol (TEMOVATE) 0.05 % Ointment Apply topically Twice daily To rash on hands and feet  60 g  0   . FLUoxetine (PROZAC) 20 mg Oral Tablet Take 1 Tab (20 mg total) by mouth Once a day Tablet necessary, allergy to dye in capsule.  30 Tab  1   . fluticasone (FLONASE) 50 mcg/actuation Nasal Spray, Suspension 2 Sprays by Each Nostril route Once a day.  16 g  11   . Gabapentin (NEURONTIN) 600 mg Oral Tablet Take 1 Tab (600 mg total) by mouth Three times a day  90 Tab  5   . Halobetasol Propionate (ULTRAVATE) 0.05 % Apply externally Cream Apply  topically. Three times a week  15 g  2   . HYDROcodone-acetaminophen (NORCO) 5-325 mg Oral Tablet Take 1 Tab by mouth Every 4 hours as needed for Pain  30 Tab  0   . metroNIDAZOLE (FLAGYL) 500 mg Oral Tablet Take 1 Tab (500 mg total) by mouth Three times a day  20 Tab  0   . Minocycline (MINOCIN) 100 mg Oral Capsule Take 1 Cap (100 mg total) by mouth Once a day. Use for  10 days if need for recurrent boils  10 Cap  0   . nitroglycerin (NITROSTAT) 0.4 mg Sublingual Tablet, Sublingual 1 Tab (0.4 mg total) by Sublingual route Every 5 minutes as needed for Chest pain for 3 doses over 15 minutes  30 Tab  5   . nystatin (NYSTOP) 100,000 unit/gram Apply externally Powder Apply once or twice daily to PREVENT rash under breasts  60 g  11   . sucralfate (CARAFATE) 100 mg/mL Oral Suspension Take 10 mL (1 g total) by mouth Every 6 hours  420 mL  2   . triamcinolone acetonide (ARISTOCORT A) 0.1 % Ointment by Apply Topically route Twice daily  80 g  2     No Facility-Administered Medications for the 05/16/13 encounter (Office Visit) with Coy Saunas, MD.        ALLERGIES  Allergies   Allergen Reactions   . Cymbalta (Duloxetine)      Rash on feet and hands   . Blue Dye      Told to avoid due to patch test result by Abundio Miu   . Codeine Itching     RASH    . Mobic (Meloxicam) Nausea/ Vomiting   . Nickel    . Naprosyn (Naproxen) Nausea/ Vomiting       REVIEW OF SYSTEMS  As above, 10 point review of systems otherwise negative in detail.    PHYSICAL EXAM  BP 126/78   Pulse 90   Wt 93.4 kg (205 lb 14.6 oz)   BMI 33.73 kg/m2   General: Well-developed, well-nourished female in no acute distress  HEENT: Normocephalic, atraumatic, mucous membranes moist, neck supple  CV: Good perfusion throughout  Pulm: Breathing comfortably on room air  Skin: Warm and dry to touch   Psych: Alert and oriented x 3, normal mood and affect  Neurologic: Grossly intact to motor / sensation  Gait: Antalgic    On physical examination of the patient's left knee:  Inspection: fullness about the anterior knee (prepatellar bursal fullness)  Tenderness to Palpation: medial and lateral joint lines, medial and lateral patellar facest  Swelling: trace effusion  Range of Motion: 0-90 degrees actively  Strength: 5-/5 in flexion and extension  Special tests: (+) patellar grind, (-) Lachman's, (-) varus/valgus stress at 0 and 20 degrees, (-) McMurray's    STUDIES: deferred additional    ASSESSMENT AND PLAN:  (719.46) Pain in joint, lower leg  (primary encounter diagnosis)  (715.96) Osteoarthritis, knee  Pain related to osteoarthritis most likely at this point.   Plan:   Injection today to aid with pain - ASPIRATION/INJECTION MAJOR JOINT/BURSA (AMB ONLY)  Continue medication regimen and icing otherwise  Continue home exercises until able to return to formal physical therapy  OK to take acetaminophen (Tylenol) as 2 pills twice a day to help control the constant pain - please do not take more than 6 of these pills in a day though      She will f/u in 6 weeks, sooner as needed.    Coy Saunas, MD 05/16/2013, 10:26 AM

## 2013-05-16 NOTE — Progress Notes (Signed)
 05/16/13 1000 05/16/13 1047   Medication Administration   Medication  Betamethasone  Kenalog    Medication Dose 6mg  40   NDC # 9482-9279-98 [id number]   LOT # [id number]   Expiration date 08/24/14 07/24/14   Clinic Supplied Yes Yes   Patient Supplied No No

## 2013-05-16 NOTE — Patient Instructions (Addendum)
Injection today to aid with pain  Continue medication regimen and icing otherwise  Continue home exercises until able to return to formal physical therapy  It is OK to take acetaminophen (Tylenol) as 2 pills twice a day to help control the constant pain - please do not take more than 6 of these pills in a day though

## 2013-05-17 ENCOUNTER — Encounter (HOSPITAL_COMMUNITY): Payer: MEDICAID | Admitting: Psychiatry

## 2013-05-24 NOTE — Procedures (Signed)
A timeout was performed before the procedure. After discussion of the risks and benefits of a corticosteroid injection into her left knee, she provided verbal consent. Using an anterolateral approach, the knee was sterilely prepped then superficially numbed with ethyl chloride spray after which a mixture of 3 mL 1% lidocaine, 6 mg betamethasone, and 40 mg Kenalog was injected without difficulty. The patient tolerated the procedure well.    Leonidas Boateng F Neeley Sedivy, MD

## 2013-05-25 NOTE — Telephone Encounter (Signed)
 Message copied by Quayshawn Nin M on Fri May 25, 2013  4:48 PM  ------       Message from: DOIS SERVICE       Created: Fri May 25, 2013 12:19 PM         >> SERVICE DOIS 05/25/2013 12:19 PM       Dr Gretta, patient's script for FLUoxetine  (PROZAC ) 20 mg Oral Tablet needs prior auth due to the fact patient must have tabs instead of a capsule as she is allergic to the dye.  Please call [phone] for prior auth.  Thank you       Last seen:  7.21.14       Next appt:  9.15.14  ------

## 2013-05-30 ENCOUNTER — Ambulatory Visit: Payer: MEDICAID | Attending: Family Medicine | Admitting: Family Medicine

## 2013-05-30 VITALS — BP 130/82 | HR 78 | Temp 96.4°F | Ht 65.39 in | Wt 204.6 lb

## 2013-05-30 DIAGNOSIS — Z0000 Encounter for general adult medical examination without abnormal findings: Secondary | ICD-10-CM | POA: Insufficient documentation

## 2013-05-30 DIAGNOSIS — Z131 Encounter for screening for diabetes mellitus: Secondary | ICD-10-CM

## 2013-05-30 DIAGNOSIS — E785 Hyperlipidemia, unspecified: Secondary | ICD-10-CM | POA: Insufficient documentation

## 2013-05-30 NOTE — Patient Instructions (Signed)
LAB TESTS  Fasting lab test(s) have been ordered for you.  Please return to the lab SOON when you have fasted overnight to have lab test drawn.  Fasting means that you should have no food or drinks that contain calories for at least 10 hrs before this test.  You may have water, coffee or tea without cream or sugar (artificial sweetener is okay), or diet soft drinks during the 10 hr fast.    You don't need an appointment for lab tests. The Family Medicine lab is open 8 am to 3:30 pm every weekday.  [facility] is open Monday thru Friday 7:30 am to 6 pm, and Saturday 9:30 am to 1:30 pm.  You may go to whichever lab is most convenient for you.

## 2013-05-30 NOTE — Progress Notes (Signed)
SUBJECTIVE:   [name] is a 57 y.o. female  for presenting for her annual checkup.   Comprehensive health self assessment is reviewed with patient (signed and scanned).  Issue identified: chronic pain currently not too bad  Prevention History:   Health Maintenance: Pending and Last Completed    Topic Date Due Completion Date    Influenza Vaccine 06/25/2013 07/24/2012    Mammography 11/07/2014 11/07/2012    Colonoscopy 01/07/2019 01/06/2009        Lab Results   Component Value Date    CHOLESTEROL 184 10/04/2012    HDLCHOL 36* 10/04/2012    LDLCHOL 122* 10/04/2012    TRIG 131 10/04/2012      Lab Results   Component Value Date    GLUCOSEFAST 113* 10/14/2008      Lab Results   Component Value Date    HA1C 5.9 07/01/2010     I have reviewed the patient's medical , family and social history in detail and updated the computerized patient record.    Outpatient Prescriptions Prior to Visit:  atorvastatin (LIPITOR) 20 mg Oral Tablet Take 1 Tab (20 mg total) by mouth Every evening   buPROPion (BUPEPRION XL) 150 mg Oral Tablet Sustained Release 24 hr Take 1 Tab (150 mg total) by mouth Every morning   Clobetasol (TEMOVATE) 0.05 % Ointment Apply topically Twice daily To rash on hands and feet   FLUoxetine (PROZAC) 20 mg Oral Tablet Take 1 Tab (20 mg total) by mouth Once a day Tablet necessary, allergy to dye in capsule.   fluticasone (FLONASE) 50 mcg/actuation Nasal Spray, Suspension 2 Sprays by Each Nostril route Once a day.   Gabapentin (NEURONTIN) 600 mg Oral Tablet Take 1 Tab (600 mg total) by mouth Three times a day   Halobetasol Propionate (ULTRAVATE) 0.05 % Apply externally Cream Apply  topically. Three times a week   nystatin (NYSTOP) 100,000 unit/gram Apply externally Powder Apply once or twice daily to PREVENT rash under breasts   sucralfate (CARAFATE) 100 mg/mL Oral Suspension Take 10 mL (1 g total) by mouth Every 6 hours   triamcinolone acetonide (ARISTOCORT A) 0.1 % Ointment by Apply Topically route Twice daily      ciprofloxacin (CIPRO) 500 mg Oral Tablet Take 1 Tab (500 mg total) by mouth Twice daily   HYDROcodone-acetaminophen (NORCO) 5-325 mg Oral Tablet Take 1 Tab by mouth Every 4 hours as needed for Pain   metroNIDAZOLE (FLAGYL) 500 mg Oral Tablet Take 1 Tab (500 mg total) by mouth Three times a day   Minocycline (MINOCIN) 100 mg Oral Capsule Take 1 Cap (100 mg total) by mouth Once a day. Use for 10 days if need for recurrent boils   nitroglycerin (NITROSTAT) 0.4 mg Sublingual Tablet, Sublingual 1 Tab (0.4 mg total) by Sublingual route Every 5 minutes as needed for Chest pain for 3 doses over 15 minutes     No facility-administered medications prior to visit.   Tobacco History  reports that she quit smoking about 7 years ago. Her smoking use included Cigarettes. She has a 80 pack-year smoking history. She has never used smokeless tobacco.  Immunization History   Administered Date(s) Administered   . DIPTH,PERTUSSIS-ACEL,TETANUS (ADACEL) >11 YRS OLD    (ADMIN) 06/04/2010   . H1n1 Flu Vaccine Injection (Admin) 10/10/2008   . INFLUENZA VIRUS VACCINE (ADMIN) 09/11/2001   . Influenza Vaccine  IM Age 51 Yrs Through Adult (Admin) 09/09/2010, 09/07/2011, 07/24/2012   . Pneumovax 03/29/2013     family  history includes Bipolar Disorder in her daughter; Cancer in her paternal aunts and paternal uncle; Cancer (age of onset: 30) in an other family member; Congestive Heart Failure (age of onset: 37) in her father; Coronary Artery Disease in her father; Coronary Artery Disease (age of onset: 70) in her mother; Diabetes in her mother; Heart Attack in her father and sister; High Cholesterol in her mother; Hypertension in her brother, mother, and sisters; Kidney Disease in her sister; Stroke in her paternal grandmother; and Thyroid Disease in her sister.  ROS:   Constitutional: negative for fevers, chills and weight loss  Eyes: negative for visual disturbance  Ears, nose, mouth, throat, and face: negative for hearing loss, earaches and  hoarseness  Respiratory: negative for cough or dyspnea on exertion  Cardiovascular: negative for chest pressure/discomfort, palpitations, near-syncope, syncope  Gastrointestinal: negative for dysphagia, nausea, vomiting, diarrhea, constipation, abdominal pain and blood in bowel movements  Genitourinary:negative for frequency, dysuria, incontinence or hematuria.    Menstrual history: postmenopausal, no sexual concerns or vaginal irritation.  Integument/breast: negative for rash and changed mole  Hematologic/lymphatic: negative for lymphadenopathy  Musculoskeletal:negative for myalgias, arthralgias and muscle weakness  Neurological: negative for seizures and stroke  or TIA symptoms  Behvioral/Psych: negative for depression and anxiety  Endocrine: negative for diabetic symptoms including polyuria, polydipsia and weight loss  Allergic/Immunologic: negative    OBJECTIVE:   Wt Readings from Last 3 Encounters:   05/30/13 92.8 kg (204 lb 9.4 oz)   05/16/13 93.4 kg (205 lb 14.6 oz)   05/11/13 93.8 kg (206 lb 12.7 oz)     BP Readings from Last 3 Encounters:   05/30/13 130/82   05/16/13 126/78   05/11/13 120/76     BP 130/82  Pulse 78  Temp(Src) 35.8 C (96.4 F)  Ht 1.661 m (5' 5.39")  Wt 92.8 kg (204 lb 9.4 oz)  BMI 33.64 kg/m2   Body mass index is 33.64 kg/(m^2).   General appearance: alert, well appearing in no distress.  PERLA.  TMs, mouth and pharynx are normal.  Neck supple without adenopathy or thyromegaly.  Lungs are clear, good air entry, no wheezes, rhonchi or rales. Heart: S1 and S2 normal, no murmurs or gallops, regular rate and rhythm.  No edema.  Abdomen soft without tenderness, guarding, mass or organomegaly.  Skin without worrisome lesions.  Reflexes are normal.  Breasts: normal without suspicious masses, skin or nipple changes or axillary nodes.  Pelvic exam: deferred.  ASSESSMENT & PLAN:     ICD-9-CM    1. Well woman exam (no gynecological exam) V70.0    2. Hyperlipidemia 272.4 LIPID PANEL     AST  (SGOT)     ALT (SGPT)   3. Diabetes mellitus screening V77.1 GLUCOSE FASTING     Return in about 6 months (around 11/30/2013) for cholesterol.  Encouraged to get regular exercise (60 mins more days than not) and eat heart healthy diet.  Theola Sequin, MD  [facility]  Operated by [facility]  [address]  [place] [address]  Dept: [phone]  Dept Fax: [phone]

## 2013-06-18 ENCOUNTER — Encounter (INDEPENDENT_AMBULATORY_CARE_PROVIDER_SITE_OTHER): Payer: MEDICAID | Admitting: Dermatology

## 2013-06-29 ENCOUNTER — Encounter (INDEPENDENT_AMBULATORY_CARE_PROVIDER_SITE_OTHER): Payer: MEDICAID | Admitting: ORTHOPEDIC, SPORTS MEDICINE

## 2013-07-09 ENCOUNTER — Ambulatory Visit (INDEPENDENT_AMBULATORY_CARE_PROVIDER_SITE_OTHER): Payer: MEDICAID | Admitting: Psychiatry

## 2013-07-09 MED ORDER — BUPROPION HCL XL 150 MG 24 HR TABLET, EXTENDED RELEASE
150.0000 mg | ORAL_TABLET | Freq: Every morning | ORAL | Status: DC
Start: 2013-07-09 — End: 2013-09-10

## 2013-07-09 MED ORDER — FLUOXETINE 20 MG TABLET
20.0000 mg | ORAL_TABLET | Freq: Every day | ORAL | Status: DC
Start: 2013-07-09 — End: 2013-09-10
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
## 2013-07-10 ENCOUNTER — Ambulatory Visit (HOSPITAL_COMMUNITY): Payer: Self-pay | Admitting: Psychiatry

## 2013-07-10 NOTE — Telephone Encounter (Addendum)
 Prior authorization called in. Left message for patient.     Message copied by Pranshu Lyster M on Tue Jul 10, 2013  3:46 PM  ------       Message from: LENNIE ARRANT       Created: Tue Jul 10, 2013 12:00 PM         >> ARRANT LENNIE 07/10/2013 12:00 PM       Hi Dr Gretta          The patient called and stated her refill for Prozac  20 mg is needing a prior authorization through [facility].    She asked if you could do this today because she is out of medication.   Please advise the patient at [phone].   Thank you  ------

## 2013-07-31 ENCOUNTER — Ambulatory Visit (HOSPITAL_COMMUNITY): Payer: Self-pay | Admitting: Psychiatry

## 2013-07-31 NOTE — Telephone Encounter (Addendum)
I wrote patient a note indicating she was undergoing treatment and left it at the front desk.     Sherrilee Gilles, MD 07/31/2013, 1:54 PM        Message copied by Freda Jackson on Tue Jul 31, 2013  1:52 PM  ------       Message from: Cathlean Marseilles       Created: Tue Jul 31, 2013 11:35 AM         >> Cathlean Marseilles 07/31/2013 11:35 AM       Hi Dr.Triston Lisanti,       Pt called requesting you write a letter dismissing her from [employer]. She needs this letter as soon as possible. Please advise pt at [phone].       Thank you!       Jacie  ------

## 2013-08-18 ENCOUNTER — Other Ambulatory Visit: Payer: Self-pay

## 2013-08-20 ENCOUNTER — Other Ambulatory Visit: Payer: Self-pay

## 2013-08-29 ENCOUNTER — Encounter (INDEPENDENT_AMBULATORY_CARE_PROVIDER_SITE_OTHER): Payer: Self-pay

## 2013-08-29 ENCOUNTER — Ambulatory Visit (HOSPITAL_BASED_OUTPATIENT_CLINIC_OR_DEPARTMENT_OTHER): Payer: MEDICAID

## 2013-08-29 ENCOUNTER — Ambulatory Visit
Admission: RE | Admit: 2013-08-29 | Discharge: 2013-08-29 | Disposition: A | Discharge status: Home / Self Care | Payer: MEDICAID | Source: Ambulatory Visit

## 2013-08-29 VITALS — BP 116/74 | HR 84 | Temp 97.0°F | Ht 65.39 in | Wt 210.1 lb

## 2013-08-29 DIAGNOSIS — J111 Influenza due to unidentified influenza virus with other respiratory manifestations: Secondary | ICD-10-CM | POA: Insufficient documentation

## 2013-08-29 DIAGNOSIS — H6690 Otitis media, unspecified, unspecified ear: Secondary | ICD-10-CM | POA: Insufficient documentation

## 2013-08-29 DIAGNOSIS — G4700 Insomnia, unspecified: Secondary | ICD-10-CM | POA: Insufficient documentation

## 2013-08-29 DIAGNOSIS — E785 Hyperlipidemia, unspecified: Secondary | ICD-10-CM | POA: Insufficient documentation

## 2013-08-29 DIAGNOSIS — R599 Enlarged lymph nodes, unspecified: Secondary | ICD-10-CM | POA: Insufficient documentation

## 2013-08-29 HISTORY — DX: Hyperlipidemia, unspecified: E78.5

## 2013-08-29 LAB — LIPID PANEL
CHOLESTEROL: 204 mg/dL — ABNORMAL HIGH (ref <200)
HDL-CHOLESTEROL: 42 mg/dL (ref >39)
LDL (CALCULATED): 141 mg/dL — ABNORMAL HIGH (ref <100)
NON - HDL (CALCULATED): 162 mg/dL (ref <190)
TRIGLYCERIDES: 103 mg/dL (ref <150)
VLDL (CALCULATED): 21 mg/dL (ref <30)

## 2013-08-29 LAB — AST (SGOT): AST (SGOT): 16 U/L (ref 8–45)

## 2013-08-29 LAB — GLUCOSE FASTING: GLUCOSE, FASTING: 104 mg/dL (ref 70–105)

## 2013-08-29 LAB — ALT (SGPT): ALT (SGPT): 22 U/L (ref <55)

## 2013-08-29 MED ORDER — ATORVASTATIN 20 MG TABLET
20.0000 mg | ORAL_TABLET | Freq: Every evening | ORAL | Status: DC
Start: 2013-08-29 — End: 2013-10-11

## 2013-08-29 MED ORDER — PROMETHAZINE 6.25 MG-CODEINE 10 MG/5 ML SYRUP
5.00 mL | ORAL_SOLUTION | Freq: Four times a day (QID) | ORAL | Status: DC | PRN
Start: 2013-08-29 — End: 2014-08-19

## 2013-08-29 MED ORDER — AMOXICILLIN-POTASSIUM CLAVULANATE 1,000 MG-62.5 MG TABLET,EXT.REL 12HR
1.00 | ORAL_TABLET | Freq: Two times a day (BID) | ORAL | Status: DC
Start: 2013-08-29 — End: 2013-08-31

## 2013-08-29 NOTE — Progress Notes (Signed)
08/29/13 1600   Rapid Flu   Rapid Flu Result Negative  (negative A and negative B from [place] machine)   Lot# [id number]   Expiration Date 05/10/14   Internal Control Valid yes   Initials lw

## 2013-08-29 NOTE — Progress Notes (Signed)
Subjective:     Patient ID:  [name] is an 57 y.o. female   Chief Complaint:    Chief Complaint   Patient presents with   . Ear Ache   . Cough   . Sore Throat       HPI  57 yo female here with complaint of being ill since Saturday afternoon.  Symptoms include bad cough productive for white sputum, left ear pain, N/V, no diarrhea.  Bad headache as well. Not sure about fever because she has been taking Advil regularly.   Symptoms were of sudden onset.  Symptoms are about the same as when they started.   Worse at night.  Took some OTC night time cold/cough med which didn't help.  Has been using a lot of cough drops as well.     Has not had flu shot this year.     Review of Systems   Constitutional: Positive for chills, malaise/fatigue and diaphoresis. Negative for fever.   HENT: Positive for congestion, ear pain and sore throat.    Respiratory: Positive for cough, sputum production and shortness of breath. Negative for wheezing.    Cardiovascular: Negative for chest pain and palpitations.   Gastrointestinal: Positive for nausea and vomiting. Negative for heartburn, diarrhea and constipation.   Musculoskeletal: Positive for back pain, joint pain, myalgias and neck pain.   Neurological: Positive for headaches. Negative for dizziness and tingling.   Psychiatric/Behavioral: The patient has insomnia.      Objective:  BP 116/74  Pulse 84  Temp(Src) 36.1 C (97 F)  Ht 1.661 m (5' 5.39")  Wt 95.3 kg (210 lb 1.6 oz)  BMI 34.54 kg/m2     Physical Exam   Constitutional: She is oriented to person, place, and time. She appears well-developed and well-nourished.   Ill but nontoxic   HENT:   Head: Normocephalic and atraumatic.   Both TMs red and dull.  Pharynx mildly injected.  Nasal congestion   Neck: Neck supple. No thyromegaly present.   Shotty anterior cervical adenopathy   Cardiovascular: Normal rate, regular rhythm and normal heart sounds.    Pulm:  Effort normal and breath sounds normal.  Observations:  no  respiratory distress.    Ortho/Musculoskeletal:   She exhibits no edema.   Lymphadenopathy:     She has cervical adenopathy.   Neurological: She is alert and oriented to person, place, and time.   Skin: Skin is warm and dry.   Psychiatric: She has a normal mood and affect. Her behavior is normal.     .    Assessment & Plan:       ICD-9-CM    1. Influenza-like illness 487.1 POCT RAPID FLU  Negative rapid flu   2. Hyperlipidemia LDL goal < 100 272.4 atorvastatin (LIPITOR) 20 mg Oral Tablet  Refilled meds   3. Otitis media of both ears 382.9 Augmentin and symptomatic management     Follow up with PCP as scheduled and prn.  Donald Memoli Neiman-Hart,MD

## 2013-08-30 ENCOUNTER — Ambulatory Visit (INDEPENDENT_AMBULATORY_CARE_PROVIDER_SITE_OTHER): Payer: Self-pay | Admitting: Family Medicine

## 2013-08-30 NOTE — Telephone Encounter (Signed)
Message copied by Herma Ard on Thu Aug 30, 2013  1:37 PM  ------       Message from: Bailey Mech R       Created: Thu Aug 30, 2013 10:17 AM         >> EMILY R STEWART 08/30/2013 10:17 AM       [facility] IS REJECTING   ANTIBIOTIC  FOR   AMOXICILLIN -POT   PLEASE CALL [facility]   [phone]   ID# [id number]    PER PHARMACY PLEASE CALL         TO GET PRIOR AUTH  ------

## 2013-08-31 MED ORDER — AMOXICILLIN 500 MG-POTASSIUM CLAVULANATE 125 MG TABLET
1.00 | ORAL_TABLET | Freq: Three times a day (TID) | ORAL | Status: DC
Start: 2013-08-31 — End: 2013-09-19

## 2013-08-31 NOTE — Telephone Encounter (Signed)
Medicaid won't cover the Amoxicillin/clavulanate ER but would pay for Amoxicillin /clavulanate IR.  Do you want to change it or would you want a prior auth form to complete?

## 2013-08-31 NOTE — Telephone Encounter (Signed)
 Patient notified  New RX called in Augmentin  500 mg tid sent to pharmacy.   Nuriya Stuck E Jamarl Pew, RN

## 2013-08-31 NOTE — Telephone Encounter (Signed)
New script for Augmentin 500 mg tid sent to pharmacy  Please let patient know.  Shiana Rappleye Neiman-Hart,MD

## 2013-08-31 NOTE — Telephone Encounter (Signed)
Message copied by Josephine Cables on Fri Aug 31, 2013  3:07 PM  ------       Message from: Enriqueta Shutter [facility]       Created: Caleen Essex Aug 31, 2013  2:12 PM         >> Gevena Cotton 08/31/2013 02:12 PM       Luan Pulling pt.              Pt called and states she is having extreme pain in both ears. She states she can not wait on the prior auth. She asks Dr. Luan Pulling please call something else in in the meantime. Please advise, thank you!\              Preferred Pharmacy         [facility] [address] [place] - [address]         [address] [place] [address]         Phone: [phone] Fax: [phone]         Open 24 Hours?: No                >> EMILY R STEWART 08/30/2013 10:17 AM       [facility] IS REJECTING   ANTIBIOTIC  FOR   AMOXICILLIN -POT   PLEASE CALL [facility]   [phone]   ID# [id number]    PER PHARMACY PLEASE CALL         TO GET PRIOR AUTH         ------

## 2013-09-02 ENCOUNTER — Encounter (INDEPENDENT_AMBULATORY_CARE_PROVIDER_SITE_OTHER): Payer: Self-pay

## 2013-09-10 ENCOUNTER — Ambulatory Visit (INDEPENDENT_AMBULATORY_CARE_PROVIDER_SITE_OTHER): Payer: MEDICAID | Admitting: Psychiatry

## 2013-09-10 MED ORDER — FLUOXETINE 20 MG TABLET
20.0000 mg | ORAL_TABLET | Freq: Every day | ORAL | Status: DC
Start: 2013-09-10 — End: 2014-02-04

## 2013-09-10 MED ORDER — BUPROPION HCL SR 200 MG TABLET,12 HR SUSTAINED-RELEASE
200.0000 mg | ORAL_TABLET | Freq: Every day | ORAL | Status: DC
Start: 2013-09-10 — End: 2014-02-04
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
## 2013-09-18 NOTE — Progress Notes (Signed)
Quick Note:    Cholesterol is elevated. Suggest we could increase Lipitor to 40 mg daily which would do a better job. Please reply to this message and let me know if you are willing to try that. Let me know where to send the script, too.  Theola Sequin, MD    ______

## 2013-09-19 ENCOUNTER — Ambulatory Visit: Payer: MEDICAID | Attending: Family Medicine | Admitting: Family Medicine

## 2013-09-19 ENCOUNTER — Encounter (INDEPENDENT_AMBULATORY_CARE_PROVIDER_SITE_OTHER): Payer: Self-pay | Admitting: Family Medicine

## 2013-09-19 VITALS — BP 118/76 | HR 90 | Temp 96.8°F | Wt 212.3 lb

## 2013-09-19 DIAGNOSIS — H6690 Otitis media, unspecified, unspecified ear: Secondary | ICD-10-CM | POA: Insufficient documentation

## 2013-09-19 MED ORDER — LEVOFLOXACIN 250 MG TABLET
250.0000 mg | ORAL_TABLET | Freq: Every day | ORAL | Status: DC
Start: 2013-09-19 — End: 2013-10-23

## 2013-09-19 NOTE — Progress Notes (Signed)
SUBJECTIVE:   [name] is a 57 y.o. female who complains of bilateral ear pain for 14 days. She denies a history of fevers and wheezing and denies a history of asthma. Patient denies smoke cigarettes. Was started on Augmentin but this never really helped. Left more sx.      OBJECTIVE:  BP 118/76   Pulse 90   Temp(Src) 36 C (96.8 F) (Thermal Scan)   Wt 96.3 kg (212 lb 4.9 oz)   BMI 34.9 kg/m2  She appears well, vital signs are as noted by the nurse. Ears minimal erythema to the TM's bilaterally, no perf.  Throat and pharynx normal.  Neck supple. + mild adenopathy in the neck. Nose is congested. Sinuses non-tender. The chest is clear, without wheezes or rales.    ASSESSMENT:   otitis media B/L    PLAN:  Symptomatic therapy suggested: push fluids, rest, gargle warm salt water, use vaporizer or mist prn and ROV prn if symptoms persist or worsen. Call or return to clinic prn if these symptoms worsen or fail to improve as anticipated.  Given the holiday I will switch to Levaquin 250mg  q day. Will have her f/u with PCP in 2 weeks, sooner if no better or any worsening.  Estrella Deeds, MD  Associate Professor  Director, Division of Sports Medicine  Director, Primary Care Sports Medicine Fellowship  Dept of Family Medicine  [facility]  Operated by [facility]

## 2013-10-02 ENCOUNTER — Encounter (INDEPENDENT_AMBULATORY_CARE_PROVIDER_SITE_OTHER): Payer: MEDICAID | Admitting: Dermatology

## 2013-10-03 ENCOUNTER — Encounter (INDEPENDENT_AMBULATORY_CARE_PROVIDER_SITE_OTHER): Payer: Self-pay | Admitting: Family Medicine

## 2013-10-11 ENCOUNTER — Ambulatory Visit
Admission: RE | Admit: 2013-10-11 | Discharge: 2013-10-11 | Disposition: A | Discharge status: Home / Self Care | Payer: MEDICAID | Source: Ambulatory Visit | Attending: Family Medicine | Admitting: Family Medicine

## 2013-10-11 ENCOUNTER — Ambulatory Visit (HOSPITAL_BASED_OUTPATIENT_CLINIC_OR_DEPARTMENT_OTHER): Payer: MEDICAID

## 2013-10-11 ENCOUNTER — Ambulatory Visit (HOSPITAL_BASED_OUTPATIENT_CLINIC_OR_DEPARTMENT_OTHER): Payer: MEDICAID | Admitting: Family Medicine

## 2013-10-11 VITALS — BP 130/84 | HR 78 | Temp 96.8°F | Wt 212.3 lb

## 2013-10-11 DIAGNOSIS — M549 Dorsalgia, unspecified: Secondary | ICD-10-CM | POA: Insufficient documentation

## 2013-10-11 DIAGNOSIS — I2510 Atherosclerotic heart disease of native coronary artery without angina pectoris: Secondary | ICD-10-CM

## 2013-10-11 DIAGNOSIS — E785 Hyperlipidemia, unspecified: Secondary | ICD-10-CM

## 2013-10-11 DIAGNOSIS — I259 Chronic ischemic heart disease, unspecified: Secondary | ICD-10-CM

## 2013-10-11 DIAGNOSIS — Z23 Encounter for immunization: Secondary | ICD-10-CM

## 2013-10-11 DIAGNOSIS — M542 Cervicalgia: Secondary | ICD-10-CM

## 2013-10-11 DIAGNOSIS — H6690 Otitis media, unspecified, unspecified ear: Secondary | ICD-10-CM | POA: Insufficient documentation

## 2013-10-11 DIAGNOSIS — H6693 Otitis media, unspecified, bilateral: Secondary | ICD-10-CM

## 2013-10-11 MED ORDER — CYCLOBENZAPRINE 5 MG TABLET
5.00 mg | ORAL_TABLET | Freq: Three times a day (TID) | ORAL | Status: DC | PRN
Start: 2013-10-11 — End: 2014-08-19

## 2013-10-11 NOTE — Progress Notes (Signed)
10/11/13 1400   Questions   1. Has the patient had a severe (Anaphylactic) reaction to eggs no   2. Has the patient had a prior flu vaccine? yes   3. Has the patient had a previous reaction to the influenza vaccine no   Temperature 36 C (96.8 F)   Initials lw

## 2013-10-11 NOTE — Progress Notes (Signed)
SUBJECTIVE:  [name] is a 57 y.o. female is here today for Ear Pain      Seen 11/6 for influenza-like illness with ear redness, treated for otitis with Augmentin  Then continued to have ear pain with some neck pain, saw Dr Townsend Roger and given [facility]  Ears were not red at that visit.  Now says ears are some better except some throbbing in her ears at night, has a lot of neck pain and upper back pain  Worse with neck movement esp lateral rotation  Pain in upper back is in the middline  Heat not helping   finished levaquin and has been off for a week  No fever  No vomiting    Medication understanding addressed.      I have reviewed the patient's medical, surgical, family and social history in detail today, 10/11/2013  and updated the computerized patient record as appropriate.  has a current medication list which includes the following prescription(s): atorvastatin, bupropion, clobetasol, fluoxetine, fluticasone, halobetasol propionate, levofloxacin, nystatin, promethazine-codeine, and triamcinolone acetonide.   ROS: as above  OBJECTIVE:   Vitals: BP 130/84  Pulse 78  Temp(Src) 36 C (96.8 F)  Wt 96.3 kg (212 lb 4.9 oz)  BMI 34.9 kg/m2   Appearance:uncomfortable with neck pain, seems anxious    Exam: Ears: normal TM's and external ear canals AU  Throat: Lips, mucosa, and tongue normal. Teeth and gums normal and normal findings: oropharynx pink & moist without lesions or evidence of thrush  Neck: moderate anterior cervical adenopathy and tender over submandibular area bilaterally, mild tenderness over sternocleidomastoid and also tender over midline ;upper thoracic spinous processes from about T1-T7  No redness swelling or deformity  No tender points in upper back muscles  Some decrease in neck ROM  Lungs: clear to auscultation bilaterally, normal percussion bilaterally  Heart: regular rate and rhythm, S1, S2 normal, no murmur, click, rub or gallop  ASSESSMENT:   PLAN:    ICD-9-CM    1. Upper back  pain 724.5 XR THORACIC SPINE     XR CERVICAL SPINE COMPLETE (4 OR MORE VIEWS)  This may be a flare of fibromyalgia pain  Discussed heat, exercise, gentle movement to help   2. Need for prophylactic vaccination and inoculation against influenza V04.81 INFLUENZA VACCINE  IM AGE 29 THROUGH ADULT (ADMIN)   3. Otitis media of both ears 382.9    4. MINOR CAD (coronary artery disease) 414.9    5. Hyperlipidemia LDL goal < 100 272.4    6. Neck pain 723.1            Advised to call back directly if there are further questions, or if these symptoms fail to improve as anticipated or worsen.         Theola Sequin, MD  [facility]  Operated by [facility]  [place] [address]  Dept: [phone]  Dept Fax: [phone]

## 2013-10-19 NOTE — Telephone Encounter (Signed)
Seen for visit  Barbara Sequin, MD

## 2013-10-23 ENCOUNTER — Ambulatory Visit: Payer: MEDICAID | Attending: ORTHOPEDIC, SPORTS MEDICINE | Admitting: ORTHOPEDIC, SPORTS MEDICINE

## 2013-10-23 ENCOUNTER — Encounter (INDEPENDENT_AMBULATORY_CARE_PROVIDER_SITE_OTHER): Payer: Self-pay | Admitting: ORTHOPEDIC, SPORTS MEDICINE

## 2013-10-23 VITALS — BP 133/85 | HR 110 | Temp 98.0°F | Wt 216.1 lb

## 2013-10-23 DIAGNOSIS — IMO0002 Reserved for concepts with insufficient information to code with codable children: Secondary | ICD-10-CM | POA: Insufficient documentation

## 2013-10-23 DIAGNOSIS — R209 Unspecified disturbances of skin sensation: Secondary | ICD-10-CM | POA: Insufficient documentation

## 2013-10-23 DIAGNOSIS — M25569 Pain in unspecified knee: Secondary | ICD-10-CM | POA: Insufficient documentation

## 2013-10-23 MED ORDER — PREDNISONE 50 MG TABLET
50.0000 mg | ORAL_TABLET | Freq: Every day | ORAL | Status: AC
Start: 2013-10-23 — End: 2013-10-28

## 2013-10-23 NOTE — Progress Notes (Signed)
 Name: [name]  MEDICAL RECORD [id number]  DATE OF BIRTH: [birth year]  DATE OF SERVICE: 10/23/2013    CHIEF COMPLAINT:   Chief Complaint   Patient presents with   . Knee Pain     left       SUBJECTIVE:  [name] presents to clinic today for follow up of her left knee pain. This is not a new issue for her, but at times had improved. However, unfortunately the pain has been worsening over the last 3 months with a new issue of the knee locking with painful unlocking. She actually has to get assistance to unlock the knee. She feels the pain constantly over the anterolateral knee at a 7-8/10 severity with radiation up into her left thigh but the worst pain is when the knee locks (which seems to happen more often when her knee is extended). Ibuprofen  600 mg/dose as needed helps the constant pain go down to 5/10 - she is not taking this twice a day every day. She also has been icing the knee which helps temporarily; she is icing 3 times a day. She still is wearing the knee brace she received after her injury/accident prior - it no longer seems to be helping. 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 She endorses knee swelling, locking as noted, and episodes of giving way without actually falling (which she reports is due to walking near supports).    PAST MEDICAL, SURGICAL, AND FAMILY HISTORY  Reviewed and updated as appropriate    SOCIAL HISTORY  History   Substance Use Topics   . Smoking status: Former Smoker -- 2.00 packs/day for 40 years     Types: Cigarettes     Quit date: 10/14/2005   . Smokeless tobacco: Never Used      Comment: quit 2007   . Alcohol Use: No       MEDICATIONS  Outpatient Prescriptions Marked as Taking for the 10/23/13 encounter (Office Visit) with Margrette Mylinda FALCON, MD   Medication Sig Dispense Refill   . buPROPion  (WELLBUTRIN  SR) 200 mg Oral Tablet Sustained Release Take 1 Tab  (200 mg total) by mouth Once a day  30 Tab  2   . Clobetasol  (TEMOVATE ) 0.05 % Ointment Apply topically Twice daily To rash on hands and feet  60 g  0   . cyclobenzaprine  (FLEXERIL ) 5 mg Oral Tablet Take 1 Tab (5 mg total) by mouth Three times a day as needed for Muscle spasms (neck and back pain)  45 Tab  1   . FLUoxetine  (PROZAC ) 20 mg Oral Tablet Take 1 Tab (20 mg total) by mouth Once a day Tablet necessary, allergy to dye in capsule.  30 Tab  2   . fluticasone  (FLONASE ) 50 mcg/actuation Nasal Spray, Suspension 2 Sprays by Each Nostril route Once a day.  16 g  11   . Halobetasol  Propionate (ULTRAVATE ) 0.05 % Apply externally Cream Apply  topically. Three times a week  15 g  2   . nystatin  (NYSTOP ) 100,000 unit/gram Apply externally Powder Apply once or twice daily to PREVENT rash under breasts  60 g  11   . triamcinolone  acetonide (ARISTOCORT  A) 0.1 % Ointment by Apply Topically route Twice daily  80 g  2     No Facility-Administered Medications for the 10/23/13 encounter (Office Visit) with Margrette Mylinda FALCON, MD.  ALLERGIES  Allergies   Allergen Reactions   . Cymbalta  [Duloxetine ]      Rash on feet and hands   . Blue Dye      Told to avoid due to patch test result by Elita LUNA   . Mobic  [Meloxicam ] Nausea/ Vomiting   . Nickel    . Naprosyn  [Naproxen ] Nausea/ Vomiting       REVIEW OF SYSTEMS  As above, 10 point review of systems otherwise negative in detail beyond 1 week of awakening with left arm/hand numbness (without loss of function) and improving neck pain (since stopping atorvastatin ).    PHYSICAL EXAM  BP 133/85  Pulse 110  Temp(Src) 36.7 C (98 F) (Thermal Scan)  Wt 98 kg (216 lb 0.8 oz)  BMI 35.52 kg/m2   General: Well-developed, well-nourished female in no acute distress  HEENT: Normocephalic, atraumatic, mucous membranes moist, neck supple  CV: Good perfusion throughout  Pulm: Breathing comfortably on room air  Skin: Warm and dry to touch   Psych: Alert and oriented x 3, normal mood and  affect  Neurologic: Grossly intact to motor / sensation  Gait: Antalgic    On physical examination of the patient's left knee:  Inspection: normal  Tenderness to Palpation: medial and lateral joint lines  Swelling: moderate effusion  Range of Motion: 0-120 degrees actively  Strength: 4/5 in flexion and extension  Special tests: (+) patellar grind, (-) Lachman's, (-) varus/valgus stress at 0 and 20 degrees, (-) McMurray's for clicking (but with pain with that test)    STUDIES: deferred    ASSESSMENT AND PLAN:  (719.46) Pain in joint, lower leg  (primary encounter diagnosis)  (715.96) Osteoarthrosis, unspecified whether generalized or localized, lower leg  (782.0) Disturbance of skin sensation  Plan:   Steroid burst - predniSONE  (DELTASONE ) 50 mg Oral Tablet  Continue exercises  Continue icing  OK to resume ibuprofen  after finishing the steroid burst  Send me a message in 1.5-2 weeks letting me know how this went      She will f/u pending how she does, sooner as needed.    Mylinda JULIANNA Minerva, MD 10/23/2013, 6:03 PM

## 2013-10-23 NOTE — Patient Instructions (Signed)
 Steroid burst - take in the morning with food  Continue exercises  Continue icing  OK to resume ibuprofen  after finishing the steroid burst  Send me a message in 1.5-2 weeks letting me know how this went

## 2013-11-05 ENCOUNTER — Encounter (HOSPITAL_COMMUNITY): Payer: MEDICAID | Admitting: Psychiatry

## 2013-11-19 ENCOUNTER — Encounter (INDEPENDENT_AMBULATORY_CARE_PROVIDER_SITE_OTHER): Payer: MEDICAID | Admitting: Dermatology

## 2013-11-29 ENCOUNTER — Encounter (INDEPENDENT_AMBULATORY_CARE_PROVIDER_SITE_OTHER): Payer: MEDICAID | Admitting: Family Medicine

## 2013-12-18 ENCOUNTER — Encounter (HOSPITAL_COMMUNITY): Payer: MEDICAID | Admitting: Psychiatry

## 2013-12-21 ENCOUNTER — Encounter (HOSPITAL_COMMUNITY): Payer: MEDICAID | Admitting: Psychiatry

## 2013-12-28 ENCOUNTER — Ambulatory Visit (HOSPITAL_COMMUNITY): Payer: Self-pay | Admitting: Psychiatry

## 2013-12-28 NOTE — Telephone Encounter (Signed)
Spoke to [facility]. Last filled Wellbutrin on 1/20. I prescribed 1 month plus 1 refill. Next appt on 4/13. Patient cancelled 1/12 appt 2/2 illness.     Neldon Labella, MD  12/28/2013, 15:26      Summary: refill    >> PATRICIA RICE 12/26/2013 10:52 AM  Doctor Name: Dr Carlis Abbott     Date of last appointment: 09/10/13    Next scheduled visit: 02/04/14    Medication Requested:   buPROPion (WELLBUTRIN SR) 200 mg Oral Tablet Sustained      Preferred Pharmacy: Preferred Pharmacy   [id number] - [place], [place]   [place] [address]   Phone: [phone] Fax: [phone]   Open 24 Hours?: No         Notes for Nurse or Physician: pt requested refill. Thank you

## 2014-02-04 ENCOUNTER — Ambulatory Visit (INDEPENDENT_AMBULATORY_CARE_PROVIDER_SITE_OTHER): Payer: MEDICAID | Admitting: Psychiatry

## 2014-02-04 DIAGNOSIS — F329 Major depressive disorder, single episode, unspecified: Principal | ICD-10-CM

## 2014-02-04 MED ORDER — FLUOXETINE 20 MG TABLET
20.0000 mg | ORAL_TABLET | Freq: Every day | ORAL | Status: DC
Start: 2014-02-04 — End: 2014-04-15

## 2014-02-04 MED ORDER — BUPROPION HCL SR 200 MG TABLET,12 HR SUSTAINED-RELEASE
200.0000 mg | ORAL_TABLET | Freq: Every day | ORAL | Status: DC
Start: 2014-02-04 — End: 2014-04-15

## 2014-02-04 NOTE — Progress Notes (Addendum)
Behavioral Medicine and Psychiatry   Outpatient Progress Note      [name]   [id number]  January 23, 1956    DOS: 02/04/2014    No chief complaint on file.       SUBJECTIVE:    Patient has three classes left to graduate, and she is currently taking algebra. She is intimidated by this class. She reports that her concentration has improved since increasing the Wellbutrin. She is concerned about her schooling appropriately. Her mood is "pretty good."  She reports mood improvements with the Wellbutrin and with spring.     She has been getting better settled into her new living situation. The man that frightened her had the cops called on him multiple times, and he no longer comes around. She describes her living situation as "tolerable", but she hasn't even put her pictures up yet or tried to make it cozy for her.        OBJECTIVE:  @VITALS @    MENTAL STATUS EXAM:   Patient is 58 year old female who is casually and cleanly dressed in jeans and t shirt. She is pleasant and cooperative. She describes her mood as "pretty good" and her affect is congruent. Her speech is normal rate, rhythm, and volume. Her thought process is linear and goal directed. She is not responding to internal stimuli. She has no delusions or phobias. She is appropriately preoccupied with her studies and assignments. No SI or HI. Fair insight and judgement.    MEDICATIONS:    Outpatient Prescriptions Prior to Visit:  buPROPion (WELLBUTRIN SR) 200 mg Oral Tablet Sustained Release Take 1 Tab (200 mg total) by mouth Once a day   Clobetasol (TEMOVATE) 0.05 % Ointment Apply topically Twice daily To rash on hands and feet   cyclobenzaprine (FLEXERIL) 5 mg Oral Tablet Take 1 Tab (5 mg total) by mouth Three times a day as needed for Muscle spasms (neck and back pain)   FLUoxetine (PROZAC) 20 mg Oral Tablet Take 1 Tab (20 mg total) by mouth Once a day Tablet necessary, allergy to dye in capsule.   fluticasone (FLONASE) 50 mcg/actuation Nasal Spray,  Suspension 2 Sprays by Each Nostril route Once a day.   Halobetasol Propionate (ULTRAVATE) 0.05 % Apply externally Cream Apply  topically. Three times a week   nystatin (NYSTOP) 100,000 unit/gram Apply externally Powder Apply once or twice daily to PREVENT rash under breasts   promethazine-codeine (PHENERGAN WITH CODEINE) 6.25-10 mg/5 mL Oral Syrup Take 5 mL by mouth Four times a day as needed for Cough   triamcinolone acetonide (ARISTOCORT A) 0.1 % Ointment by Apply Topically route Twice daily     No facility-administered medications prior to visit.    ASSESSMENT: Patient is a 58 year old female with history of depression who presents for treatment of her depression. She has tolerated the medications well. She has been taking classes and has been doing well. She feels that the previous increase in Wellbutrin was helpful. Clear improvements since last admission.     Axis I: Major Depressive Disorder, history of agoraphobia now in remission   Axis II: none   Axis III: Dermatitis, GERD, CAD, recent kidney infection, diverticulitis   Axis IV: lack of social support, unemployment/disability, and living situation     PLAN:   Medications well tolerated.  Continue Prozac 20 mg daily  Continue Wellbutrin SR to 200 mg daily.   Therapy would be useful for patient, but she can not afford the gas to go  to it.     Follow up: 3 months   Patient advised to call with any questions or concerns.    Neldon Labella, MD  02/04/2014, 15:01      Late entry for 02/04/14. I saw and examined the patient.  I reviewed the resident's note.  I agree with the findings and plan of care as documented in the resident's note.  Any exceptions/additions are edited/noted.    Glennie Hawk, MD 02/05/2014, 15:22

## 2014-02-20 ENCOUNTER — Encounter (INDEPENDENT_AMBULATORY_CARE_PROVIDER_SITE_OTHER): Payer: Self-pay | Admitting: ORTHOPEDIC, SPORTS MEDICINE

## 2014-04-15 ENCOUNTER — Other Ambulatory Visit (HOSPITAL_COMMUNITY): Payer: Self-pay

## 2014-04-15 ENCOUNTER — Encounter (HOSPITAL_COMMUNITY): Payer: MEDICAID | Admitting: Psychiatry

## 2014-04-15 ENCOUNTER — Other Ambulatory Visit (HOSPITAL_COMMUNITY): Payer: Self-pay | Admitting: Psychiatry

## 2014-04-15 DIAGNOSIS — F329 Major depressive disorder, single episode, unspecified: Secondary | ICD-10-CM

## 2014-04-15 MED ORDER — BUPROPION HCL SR 200 MG TABLET,12 HR SUSTAINED-RELEASE
200.0000 mg | ORAL_TABLET | Freq: Every day | ORAL | Status: DC
Start: 2014-04-15 — End: 2014-09-12

## 2014-04-15 MED ORDER — FLUOXETINE 20 MG TABLET
20.0000 mg | ORAL_TABLET | Freq: Every day | ORAL | Status: DC
Start: 2014-04-15 — End: 2014-10-02

## 2014-05-30 ENCOUNTER — Ambulatory Visit (INDEPENDENT_AMBULATORY_CARE_PROVIDER_SITE_OTHER): Payer: MEDICAID | Admitting: Psychiatry

## 2014-05-30 DIAGNOSIS — F339 Major depressive disorder, recurrent, unspecified: Secondary | ICD-10-CM

## 2014-05-30 NOTE — Progress Notes (Addendum)
Behavioral Medicine and Psychiatry   Outpatient Progress Note      [name]   [id number]  [birth year]  05/30/2014    DOS: 05/30/2014    CC: Depression    SUBJECTIVE:  Patient says that she is doing well and that she is finishing the finals of her last class.  She was a patients of Dr. Ainsley Spinner who was not seen at her last appointment so she wound up waiting 2 hours before she was told that she would not be seen that day.  Patient reports that she is doing pretty good that she is a little stressed because she is supposed to take her last online finals for her schoolwork in a couple of weeks and she worries about it but she does not feel that it is to excess.  She does not feel that she needs to change her medications right now.  Denies SI and HI.     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  Has a degree in criminal justice. Raised four of her grandkids until they were all in school and then they went back to their parents.  "I went through empty nest syndrome twice."    Notable history: N/A    MEDICATIONS:    Outpatient Prescriptions Prior to Visit:  buPROPion (WELLBUTRIN SR) 200 mg Oral Tablet Sustained Release Take 1 Tab (200 mg total) by mouth Once a day   Clobetasol (TEMOVATE) 0.05 % Ointment Apply topically Twice daily To rash on hands and feet   cyclobenzaprine (FLEXERIL) 5 mg Oral Tablet Take 1 Tab (5 mg total) by mouth Three times a day as needed for Muscle spasms (neck and back pain)   FLUoxetine (PROZAC) 20 mg Oral Tablet Take 1 Tab (20 mg total) by mouth Once a day Tablet necessary, allergy to dye in capsule.   fluticasone (FLONASE) 50 mcg/actuation Nasal Spray, Suspension 2 Sprays by Each Nostril route Once a day.   Halobetasol Propionate (ULTRAVATE) 0.05 % Apply externally Cream Apply  topically. Three times a week   nystatin (NYSTOP) 100,000 unit/gram Apply externally Powder Apply once or twice daily to PREVENT rash under breasts   promethazine-codeine (PHENERGAN WITH  CODEINE) 6.25-10 mg/5 mL Oral Syrup Take 5 mL by mouth Four times a day as needed for Cough   triamcinolone acetonide (ARISTOCORT A) 0.1 % Ointment by Apply Topically route Twice daily     No facility-administered medications prior to visit.    OBJECTIVE (4):     Level of Consciousness:  Alert   Orientation:    Oriented to person, place and time   Appearance:  Well groomed and dressed appropriately    Behavior:       Pleasant and cooperative, Good eye contact   Motor:           No movement abnormalities   Speech:         normal   Mood:           euthymic   Affect:          consistent with mood   Perception:    Did not appear to respond to internal stimuli   Thought Process:      Linear and goal directed   Thought Content:      No delusions, preoccupations or phobias noted.   Suicidal Ideation:       None    Homicidal Ideation:   None     ASSESSMENT:  Axis I: Major  Depressive Disorder, history of agoraphobia now in remission   Axis II: none   Axis III: Dermatitis, GERD, CAD, recent kidney infection, diverticulitis   Axis IV: lack of social support, unemployment/disability, and living situation     PLAN:   Medications well tolerated.  Continue Prozac 20 mg daily  Continue Wellbutrin SR to 200 mg daily.   Therapy would be useful for patient, but she can not afford the gas to go to it.     Laboratory Studies:  N/A     Therapy:   I did not recommend that the patient see an outpatient therapist.      Follow up:  - Return to clinic in 3 months or sooner as needed.   - Patient advised to call with any questions or concerns.   - Patient advised to report to nearest emergency department or to call 911 if having any suicidal or homicidal ideations.    [name], MD 05/30/2014, 14:58        I saw and evaluated the patient on the date of service with the resident. I agree with the findings and plan of care as documented in the resident's note.  Any exceptions/additions are edited/noted.     Domingo Cocking,  MD 06/03/2014, 13:11

## 2014-07-25 ENCOUNTER — Other Ambulatory Visit: Payer: Self-pay

## 2014-08-19 ENCOUNTER — Encounter (HOSPITAL_BASED_OUTPATIENT_CLINIC_OR_DEPARTMENT_OTHER): Payer: Self-pay | Admitting: Family Medicine

## 2014-08-19 ENCOUNTER — Ambulatory Visit: Payer: MEDICAID | Attending: Family Medicine | Admitting: Family Medicine

## 2014-08-19 VITALS — BP 122/86 | HR 86 | Temp 98.1°F | Ht 65.12 in | Wt 221.3 lb

## 2014-08-19 DIAGNOSIS — Z1239 Encounter for other screening for malignant neoplasm of breast: Secondary | ICD-10-CM

## 2014-08-19 DIAGNOSIS — N904 Leukoplakia of vulva: Secondary | ICD-10-CM | POA: Insufficient documentation

## 2014-08-19 DIAGNOSIS — Z1231 Encounter for screening mammogram for malignant neoplasm of breast: Secondary | ICD-10-CM | POA: Insufficient documentation

## 2014-08-19 DIAGNOSIS — H1030 Unspecified acute conjunctivitis, unspecified eye: Secondary | ICD-10-CM | POA: Insufficient documentation

## 2014-08-19 DIAGNOSIS — M5416 Radiculopathy, lumbar region: Secondary | ICD-10-CM | POA: Insufficient documentation

## 2014-08-19 DIAGNOSIS — Z23 Encounter for immunization: Secondary | ICD-10-CM | POA: Insufficient documentation

## 2014-08-19 DIAGNOSIS — R21 Rash and other nonspecific skin eruption: Secondary | ICD-10-CM

## 2014-08-19 MED ORDER — CLOBETASOL 0.05 % TOPICAL OINTMENT
TOPICAL_OINTMENT | Freq: Two times a day (BID) | CUTANEOUS | Status: DC
Start: 2014-08-19 — End: 2020-05-08

## 2014-08-19 MED ORDER — HALOBETASOL PROPIONATE 0.05 % TOPICAL CREAM
TOPICAL_CREAM | CUTANEOUS | Status: DC
Start: 2014-08-19 — End: 2016-04-23

## 2014-08-19 MED ORDER — IBUPROFEN 400 MG TABLET
400.00 mg | ORAL_TABLET | Freq: Three times a day (TID) | ORAL | Status: DC | PRN
Start: 2014-08-19 — End: 2015-11-17

## 2014-08-19 MED ORDER — POLYMYXIN B SULFATE 10,000 UNIT-TRIMETHOPRIM 1 MG/ML EYE DROPS
1.00 [drp] | Freq: Four times a day (QID) | OPHTHALMIC | Status: DC
Start: 2014-08-19 — End: 2015-01-23

## 2014-08-19 MED ORDER — PANTOPRAZOLE 20 MG TABLET,DELAYED RELEASE
20.00 mg | DELAYED_RELEASE_TABLET | Freq: Every morning | ORAL | Status: DC
Start: 2014-08-19 — End: 2014-11-21

## 2014-08-19 NOTE — Progress Notes (Signed)
ASSESSMENT:   PLAN:    SLP-53-YY    1. Lichen sclerosus et atrophicus of the vulva N90.4 Halobetasol Propionate (ULTRAVATE) 0.05 % Cream   2. Need for immunization against influenza Z23 INFLUENZA VACCINE  IM AGE 57 THROUGH ADULT (ADMIN)   3. Rash R21 Clobetasol (TEMOVATE) 0.05 % Ointment   4. Breast cancer screening Z12.39 MAMMO BILATERAL SCREENING   5. Conjunctivitis, acute H10.30 polymyxin B sulf-trimethoprim (POLYTRIM) 10,000 unit- 1 mg/mL Ophthalmic Drops     AMB CONSULT/REFERRAL [facility]   6. Lumbar radiculopathy, acute M54.16      Goals     None        Return in about 3 months (around 11/19/2014) for low back pain.  Advised to call back directly if there are further questions, or if these symptoms fail to improve as anticipated or worsen.    SUBJECTIVE:  Harlei Lehrmann is a 58 y.o. 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    Has lichen sclerosis   Only uses halog on and off and minimal sxs  I have reviewed the patient's medical, surgical, family and social history in detail today, 09/02/2014  and updated the computerized patient record as appropriate.  has a current medication list which includes the following prescription(s): bupropion, clobetasol, fluoxetine, fluticasone, halobetasol propionate, ibuprofen, nystatin, pantoprazole, polymyxin b sulf-trimethoprim, and triamcinolone acetonide.   ROS: as above  OBJECTIVE:   Vitals: BP 122/86 mmHg  Pulse 86  Temp(Src) 36.7 C (98.1 F)  Ht 1.654 m (5' 5.12")   Wt 100.4 kg (221 lb 5.5 oz)  BMI 36.70 kg/m2   Appearance:in no apparent distress  Exam: Eyes: conjunctiva red no matting, PERRL, Fundi benign  Lungs: clear to auscultation bilaterally, normal percussion bilaterally  Heart: regular rate and rhythm, S1, S2 normal, no murmur, click, rub or gallop  Back without tenderness, no deformity, no scoliosis, negative straight leg raise  DTRs normal     [name], MD  [place]  Operated by [facility]  [place] [id number]  Dept: [phone]  Dept Fax: [phone]

## 2014-08-19 NOTE — Patient Instructions (Signed)
2015-2016 Vaccine Information Statement    Influenza (Flu) Vaccine (Inactivated or Recombinant): What you need to know    Many Vaccine Information Statements are available in Spanish and other languages. See www.immunize.org/vis  Hojas de Información Sobre Vacunas están disponibles en Español y en muchos otros idiomas. Visite www.immunize.org/vis    1. Why get vaccinated?    Influenza ("flu") is a contagious disease that spreads around the [place] every year, usually between October and May.     Flu is caused by influenza viruses, and is spread mainly by coughing, sneezing, and close contact.     Anyone can get flu. Flu strikes suddenly and can last several days. Symptoms vary by age, but can include:  o fever/chills  o sore throat  o muscle aches  o fatigue  o cough  o headache   o runny or stuffy nose    Flu can also lead to pneumonia and blood infections, and cause diarrhea and seizures in children.  If you have a medical condition, such as heart or lung disease, flu can make it worse.    Flu is more dangerous for some people. Infants and young children, people 65 years of age and older, pregnant women, and people with certain health conditions or a weakened immune system are at greatest risk.      Each year thousands of people in the [place] die from flu, and many more are hospitalized.     Flu vaccine can:  o keep you from getting flu,  o make flu less severe if you do get it, and  o keep you from spreading flu to your family and other people.     2. Inactivated and recombinant flu vaccines    A dose of flu vaccine is recommended every flu season. Children 6 months through 8 years of age may need two doses during the same flu season.  Everyone else needs only one dose each flu season.       Some inactivated flu vaccines contain a very small amount of a mercury-based preservative called thimerosal. Studies have not shown thimerosal in vaccines to be harmful, but flu vaccines that do not contain  thimerosal are available.    There is no live flu virus in flu shots.  They cannot cause the flu.     There are many flu viruses, and they are always changing. Each year a new flu vaccine is made to protect against three or four viruses that are likely to cause disease in the upcoming flu season. But even when the vaccine doesn't exactly match these viruses, it may still provide some protection    Flu vaccine cannot prevent:  o flu that is caused by a virus not covered by the vaccine, or  o illnesses that look like flu but are not.    It takes about 2 weeks for protection to develop after vaccination, and protection lasts through the flu season.     3. Some people should not get this vaccine    Tell the person who is giving you the vaccine:    o If you have any severe, life-threatening allergies.    If you ever had a life-threatening allergic reaction after a dose of flu vaccine, or have a severe allergy to any part of this vaccine, you may be advised not to get vaccinated.  Most, but not all, types of flu vaccine contain a small amount of egg protein.       o If   you ever had Guillain-Barré Syndrome (also called GBS).   Some people with a history of GBS should not get this vaccine. This should be discussed with your doctor.    o If you are not feeling well.    It is usually okay to get flu vaccine when you have a mild illness, but you might be asked to come back when you feel better.      4. Risks of a vaccine reaction     With any medicine, including vaccines, there is a chance of reactions. These are usually mild and go away on their own, but serious reactions are also possible.     Most people who get a flu shot do not have any problems with it.     Minor problems following a flu shot include:   o soreness, redness, or swelling where the shot was given    o hoarseness  o sore, red or itchy eyes  o cough  o fever  o aches  o headache  o itching  o fatigue  If these problems occur, they usually begin soon after the  shot and last 1 or 2 days.     More serious problems following a flu shot can include the following:    o There may be a small increased risk of Guillain-Barré Syndrome (GBS) after inactivated flu vaccine.  This risk has been estimated at 1 or 2 additional cases per million people vaccinated. This is much lower than the risk of severe complications from flu, which can be prevented by flu vaccine.      o Young children who get the flu shot along with pneumococcal vaccine (PCV13) and/or DTaP vaccine at the same time might be slightly more likely to have a seizure caused by fever. Ask your doctor for more information. Tell your doctor if a child who is getting flu vaccine has ever had a seizure.     Problems that could happen after any injected vaccine:     o People sometimes faint after a medical procedure, including vaccination. Sitting or lying down for about 15 minutes can help prevent fainting, and injuries caused by a fall. Tell your doctor if you feel dizzy, or have vision changes or ringing in the ears.    o Some people get severe pain in the shoulder and have difficulty moving the arm where a shot was given. This happens very rarely.    o Any medication can cause a severe allergic reaction. Such reactions from a vaccine are very rare, estimated at about 1 in a million doses, and would happen within a few minutes to a few hours after the vaccination.    As with any medicine, there is a very remote chance of a vaccine causing a serious injury or death.    The safety of vaccines is always being monitored. For more information, visit: www.cdc.gov/vaccinesafety/    5. What if there is a serious reaction?    What should I look for?    o Look for anything that concerns you, such as signs of a severe allergic reaction, very high fever, or unusual behavior.    Signs of a severe allergic reaction can include hives, swelling of the face and throat, difficulty breathing, a fast heartbeat, dizziness, and weakness - usually  within a few minutes to a few hours after the vaccination.    What should I do?    o If you think it is a severe allergic reaction or other   emergency that can't wait, call 9-1-1 and get the person to the nearest hospital. Otherwise, call your doctor.    o Reactions should be reported to the Vaccine Adverse Event Reporting System (VAERS). Your doctor should file this report, or you can do it yourself through  the VAERS web site at www.vaers.hhs.gov, or by calling 1-800-822-7967.    VAERS does not give medical advice.    6. The National Vaccine Injury Compensation Program    The National Vaccine Injury Compensation Program (VICP) is a federal program that was created to compensate people who may have been injured by certain vaccines.    Persons who believe they may have been injured by a vaccine can learn about the program and about filing a claim by calling 1-800-338-2382 or visiting the VICP website at www.hrsa.gov/vaccinecompensation.  There is a time limit to file a claim for compensation.    7. How can I learn more?  o Ask your healthcare provider. He or she can give you the vaccine package insert or suggest other sources of information.  o Call your local or state health department.  o Contact the Centers for Disease Control and Prevention (CDC):  - Call 1-800-232-4636 (1-800-CDC-INFO) or  - Visit CDC's website at www.cdc.gov/flu    Vaccine Information Statement   Inactivated Influenza Vaccine   05/31/2014  42 U.S.C. § 300aa-26    Department of Health and Human Services  Centers for Disease Control and Prevention    Office Use Only

## 2014-08-20 ENCOUNTER — Ambulatory Visit (HOSPITAL_BASED_OUTPATIENT_CLINIC_OR_DEPARTMENT_OTHER): Payer: Self-pay | Admitting: Family Medicine

## 2014-08-20 ENCOUNTER — Ambulatory Visit: Payer: MEDICAID | Attending: Ophthalmology | Admitting: Ophthalmology

## 2014-08-20 ENCOUNTER — Encounter (INDEPENDENT_AMBULATORY_CARE_PROVIDER_SITE_OTHER): Payer: Self-pay | Admitting: Ophthalmology

## 2014-08-20 DIAGNOSIS — I2510 Atherosclerotic heart disease of native coronary artery without angina pectoris: Secondary | ICD-10-CM | POA: Insufficient documentation

## 2014-08-20 DIAGNOSIS — H1030 Unspecified acute conjunctivitis, unspecified eye: Secondary | ICD-10-CM

## 2014-08-20 DIAGNOSIS — Z87891 Personal history of nicotine dependence: Secondary | ICD-10-CM | POA: Insufficient documentation

## 2014-08-20 DIAGNOSIS — H538 Other visual disturbances: Secondary | ICD-10-CM | POA: Insufficient documentation

## 2014-08-20 DIAGNOSIS — K219 Gastro-esophageal reflux disease without esophagitis: Secondary | ICD-10-CM | POA: Insufficient documentation

## 2014-08-20 DIAGNOSIS — H579 Unspecified disorder of eye and adnexa: Secondary | ICD-10-CM

## 2014-08-20 DIAGNOSIS — R5381 Other malaise: Secondary | ICD-10-CM

## 2014-08-20 NOTE — Telephone Encounter (Signed)
-----   Message from [place] sent at 08/20/2014  1:49 PM EDT -----  >> CAITLIN ROSE CATHER 08/20/2014 01:49 PM  Rodney Booze PT    PT was calling because she went to the eye doctor and he believes that it may be her thyroid that is causing problems with her eyes. States the DR is sending her a note but she wanted to see if blood work orders could be placed in the system. Please call her back and advise.    Thank you

## 2014-08-20 NOTE — Progress Notes (Addendum)
[place]  Operated by [facility]  [place] [address]  Dept: [phone]    Patient Name: [name]  MRN#: [id number]  [place]: 1956-04-16    Date of Service: 08/20/2014    Chief Complaint     New Patient          [name] is a 58 y.o. female who presents today for evaluation/consultation of:  HPI     NPV approx 3 weeks right side feeling of pressure front of eye with light sensivity with blurry vision continual worsening about 2 weeks ago moved to left eye and with irritation and mild redness; Pt yesterday seen by PCP put on Polytrim tid OU but burns when put in. Clarain tried and warm soaks didn't help.   Note Aug given antibiotic and valtrex for right side swollen tender gland under with little red bump- by pt very allergic to many environmental allergies - antibiotic cleared and d/c valtrex due to nausea.    Note few years ago hosp at [facility] for bad eye infection both eyes could have lost vision but unknown etiology or reason.         ROS     Positive for: Skin, Musculoskeletal, Eyes, Allergic/Imm    Negative for: Gastrointestinal, Respiratory           [place] Shi, COMT 08/20/2014, 13:23     MD Addition to HPI: 58 yo female in for blurred vision evaluation OU. Pt c/o irritation and redness OU. Pt using Polytrim TID OU.     Past Surgical History   Procedure Laterality Date    Pb upper gi endoscopy,biopsy  12/19/07     patulous GE junction zone, erythema, nonerosive GERD    Hx ankle fracture tx  2007     left distal fibula, casted    Hx wrist fracture tx  2007     Dulce injury    Hx partial hysterectomy      Colonoscopy  01/06/09     COLONOSCOPY performed by Loletta Parish F at [place]    Hx cystocele repair  09/24/09    Hx gall bladder surgery/chole      Hx adenoidectomy      Hx tonsillectomy      Gastroscopy  03/05/2010     GASTROSCOPY performed by Jocelyn Lamer at [place] with biopsy  03/05/2010     GASTROSCOPY WITH  BIOPSY performed by Jocelyn Lamer at [place]    Pb revise ulnar nerve at elbow  1979    Hx hysterectomy      Hx appendectomy      Hx colonoscopy      Hx hand surgery             Past Medical History   Diagnosis Date    depression     Multinodular goiter     Headache(784.0)      Migraines    Arthritis     Vaginal prolapse     Rheumatic fever 17     no complications    Depression     GERD (gastroesophageal reflux disease)      controlled w/med    Wears glasses     Wears dentures     Contact dermatitis and other eczema, due to unspecified cause      allergic reaction to detergent    MINOR CAD (coronary artery disease) 11/10/2012    Hyperlipidemia LDL goal <  100 08/29/2013           Patient Active Problem List   Diagnosis    Migraine Headaches    GERD not well controlled    Depression    Patellofemoral pain syndrome    Multinodular goiter    Microscopic hematuria    Neck pain    Well woman exam    Lichen sclerosus et atrophicus of the vulva    Carpal tunnel syndrome on right    Osteoarthritis, knee    Left knee pain    Chest pain at rest    MINOR CAD (coronary artery disease)    Hyperlipidemia LDL goal < 100    Otitis media of both ears       Family History:  Family History   Problem Relation Age of Onset    Congestive Heart Failure Father 18     died at 35    Coronary Artery Disease Father     Heart Attack Father     Heart Attack Sister      17    Hypertension Brother     Hypertension Sister     Hypertension Sister     Kidney Disease Sister      ESRD    Stroke Paternal Grandmother     Diabetes Mother     High Cholesterol Mother     Hypertension Mother     Coronary Artery Disease Mother 73     CABG    Thyroid Disease Sister     Bipolar Disorder Daughter     Cancer Paternal Aunt      Breast Ca    Cancer Paternal Aunt      Breast Ca    Cancer Paternal Aunt      Breast Ca    Cancer Paternal Aunt      Breast Ca    Cancer Paternal Aunt      Breast Ca    Cancer Paternal  Uncle      Leukemia    Cancer Other 68     Breast Ca/Breast Ca           Social History:     History   Substance Use Topics    Smoking status: Former Smoker -- 2.00 packs/day for 40 years     Types: Cigarettes     Quit date: 10/14/2005    Smokeless tobacco: Never Used      Comment: quit 2007    Alcohol Use: No            My Assessment/Ophthalmic Plan of Care:  Diffuse congential injection superior sclera show findings could be consistent with thyroid eye disease. Will ask PCP to obtain thyroid hormone levels.   IOP within normal range, 13 and 14.  D/c Polytrim.          Documented chief complaint, history of present illness, allergies, review of systems, past medical, past surgical, family and social history were reviewed by me and when necessary I made changes to the technician note.  I also reviewed and agree with the findings documented in ophthalmology exam tab. I discussed the above diagnoses listed in the assessment and the above ophthalmic plan of care with the patient and patient's family. All questions were answered.      No orders of the defined types were placed in this encounter.       No orders of the defined types were placed in this encounter.       I scribed a  portion of the encounter including Chief Complaint, HPI, Impression, Plan, and exam elements excluding visual acuity, lensometry, pupil assessment, confrontational visual fields, tonometry results, extraocular motility assessment, refractometry results, and assessment of mood and orientation. I scribed this note at the request of the physician who personally performed the services documented.  Salome Arnt, [place] 08/20/2014, 13:30    I have reviewed and confirmed the ROS, PFSH, and exam elements performed and documented by the technician. The scribed portion of the progress note was scribed on my behalf and at my direction. I have reviewed and attest to the accuracy of the note.   Dennis Bast, MD 08/20/2014, 15:37

## 2014-08-21 NOTE — Progress Notes (Addendum)
I was personally present for the entire patient interaction. I have read and agree with the student's note.  [name], [place]  08/21/2014, 09:41    S: Patient is a 58 year old female who came in today for a follow-up. She has a past medical history of GERD, depression, migraines, and osteoarthritis.  She expressed concerns with sinus pressure around her eyes, but denies sinus drainage. She is currently taking OTC Claritin with no relief.  She also has been having pain in her lower back and down her left leg. She states this pain is worse when she sits for an extended period of time and is also disturbing her sleep. She states this pain is occuring most nights of the week and is taking OTC Advil with a little relief.     O:  Current Outpatient Prescriptions   Medication Sig    buPROPion (WELLBUTRIN SR) 200 mg Oral Tablet Sustained Release Take 1 Tab (200 mg total) by mouth Once a day    Clobetasol (TEMOVATE) 0.05 % Ointment Apply topically Twice daily To rash on hands and feet    FLUoxetine (PROZAC) 20 mg Oral Tablet Take 1 Tab (20 mg total) by mouth Once a day Tablet necessary, allergy to dye in capsule.    fluticasone (FLONASE) 50 mcg/actuation Nasal Spray, Suspension 2 Sprays by Each Nostril route Once a day. (Patient not taking: Reported on 08/19/2014)    Halobetasol Propionate (ULTRAVATE) 0.05 % Cream Apply  topically. Three times a week    Ibuprofen (MOTRIN) 400 mg Oral Tablet Take 1 Tab (400 mg total) by mouth Three times a day as needed for Pain    nystatin (NYSTOP) 100,000 unit/gram Apply externally Powder Apply once or twice daily to PREVENT rash under breasts    pantoprazole (PROTONIX) 20 mg Oral Tablet, Delayed Release (E.C.) Take 1 Tab (20 mg total) by mouth Every morning before breakfast    polymyxin B sulf-trimethoprim (POLYTRIM) 10,000 unit- 1 mg/mL Ophthalmic Drops Instill 1 Drop into both eyes Every 6 hours (Patient taking differently: Instill 1 Drop into both eyes Every 6 hours  Began  08/19/14)    triamcinolone acetonide (ARISTOCORT A) 0.1 % Ointment by Apply Topically route Twice daily     A/P:  1. Sinus Pressure: Patient has sinus pressure around both eyes. She has been taking Claritin with no relief. She has a prescription for Flonase, but has not been using the medication.  She has had history of eye pressure and infections. Patient will benefit from referral to ophthalmologist to ensure it is not an infection in her eye.  Patient may benefit from antibiotic eye drops until she can meet with ophthalmologist.    - Continue OTC Claritin   - Recommended restarting Flonase 2 sprays in each nostril once daily    - Recommended Polytrim eye drops, 1 drop in both eyes QID   - Recommend referring patient to ophthalmologist   2. Arthritis Pain: Patient has had pain in her lower back and left extremity pain.  The pain is explained as worsening with long period of walking, sitting, or laying down.  The pain is likely due to her sciatic nerve and will benefit from an NSAID. Patient has history of stomach irritation with anti-inflammatory use and will benefit from a PPI. Patient is allergic to blue dye, which is often found in Prilosec    - Recommended Motrin 400 mg every 4-6 hours as needed for pain   - Recommended Protonix 20mg  once daily  [employer], RX STUDENT

## 2014-08-22 ENCOUNTER — Encounter (HOSPITAL_BASED_OUTPATIENT_CLINIC_OR_DEPARTMENT_OTHER): Payer: Self-pay | Admitting: Family Medicine

## 2014-08-22 NOTE — Telephone Encounter (Signed)
Thyroid function tests ordered.

## 2014-08-24 ENCOUNTER — Ambulatory Visit
Admission: RE | Admit: 2014-08-24 | Discharge: 2014-08-24 | Disposition: A | Discharge status: Home / Self Care | Payer: MEDICAID | Source: Ambulatory Visit | Attending: Family Medicine | Admitting: Family Medicine

## 2014-08-24 DIAGNOSIS — H579 Unspecified disorder of eye and adnexa: Secondary | ICD-10-CM | POA: Insufficient documentation

## 2014-08-24 DIAGNOSIS — R5381 Other malaise: Secondary | ICD-10-CM | POA: Insufficient documentation

## 2014-08-24 LAB — THYROID STIMULATING HORMONE WITH FREE T4 REFLEX: THYROID STIMULATING HORMONE WITH FREE T4 REFLEX: 1.109 u[IU]/mL (ref 0.350–5.000)

## 2014-08-28 ENCOUNTER — Ambulatory Visit
Admission: RE | Admit: 2014-08-28 | Discharge: 2014-08-28 | Disposition: A | Discharge status: Home / Self Care | Payer: MEDICAID | Source: Ambulatory Visit | Attending: Family Medicine | Admitting: Family Medicine

## 2014-08-28 ENCOUNTER — Ambulatory Visit (HOSPITAL_BASED_OUTPATIENT_CLINIC_OR_DEPARTMENT_OTHER)
Admission: RE | Admit: 2014-08-28 | Discharge: 2014-08-28 | Disposition: A | Discharge status: Home / Self Care | Payer: MEDICAID | Source: Ambulatory Visit | Attending: Family Medicine | Admitting: Family Medicine

## 2014-08-28 ENCOUNTER — Other Ambulatory Visit (HOSPITAL_BASED_OUTPATIENT_CLINIC_OR_DEPARTMENT_OTHER): Payer: Self-pay | Admitting: Family Medicine

## 2014-08-28 DIAGNOSIS — R928 Other abnormal and inconclusive findings on diagnostic imaging of breast: Secondary | ICD-10-CM

## 2014-08-28 DIAGNOSIS — Z1239 Encounter for other screening for malignant neoplasm of breast: Secondary | ICD-10-CM

## 2014-08-28 DIAGNOSIS — Z1231 Encounter for screening mammogram for malignant neoplasm of breast: Secondary | ICD-10-CM | POA: Insufficient documentation

## 2014-08-28 DIAGNOSIS — N6002 Solitary cyst of left breast: Secondary | ICD-10-CM | POA: Insufficient documentation

## 2014-09-12 ENCOUNTER — Other Ambulatory Visit (HOSPITAL_COMMUNITY): Payer: Self-pay | Admitting: Psychiatry

## 2014-09-12 NOTE — Telephone Encounter (Signed)
Refilled the following prescription per patient request:      Buproprion SR 200 mg Daily, dispense 30 tablets, with 1 refills    [name], MD 09/12/2014, 13:12

## 2014-10-02 ENCOUNTER — Ambulatory Visit: Payer: MEDICAID | Attending: Family Practice-BA | Admitting: Family Practice-BA

## 2014-10-02 ENCOUNTER — Encounter (HOSPITAL_BASED_OUTPATIENT_CLINIC_OR_DEPARTMENT_OTHER): Payer: Self-pay | Admitting: Family Practice-BA

## 2014-10-02 ENCOUNTER — Telehealth (HOSPITAL_COMMUNITY): Payer: Self-pay | Admitting: Psychiatry

## 2014-10-02 VITALS — BP 133/70 | HR 83 | Temp 98.2°F | Ht 65.12 in | Wt 221.6 lb

## 2014-10-02 DIAGNOSIS — H01003 Unspecified blepharitis right eye, unspecified eyelid: Secondary | ICD-10-CM | POA: Insufficient documentation

## 2014-10-02 DIAGNOSIS — H01006 Unspecified blepharitis left eye, unspecified eyelid: Secondary | ICD-10-CM | POA: Insufficient documentation

## 2014-10-02 DIAGNOSIS — F329 Major depressive disorder, single episode, unspecified: Secondary | ICD-10-CM

## 2014-10-02 MED ORDER — MINERAL OIL-HYDROPHIL PETROLAT TOPICAL OINTMENT
TOPICAL_OINTMENT | Freq: Four times a day (QID) | CUTANEOUS | Status: DC | PRN
Start: 2014-10-02 — End: 2018-04-10

## 2014-10-02 MED ORDER — METRONIDAZOLE 0.75 % TOPICAL CREAM
TOPICAL_CREAM | Freq: Two times a day (BID) | CUTANEOUS | Status: DC
Start: 2014-10-02 — End: 2015-07-29

## 2014-10-02 MED ORDER — FLUOXETINE 20 MG TABLET
20.0000 mg | ORAL_TABLET | Freq: Every day | ORAL | Status: DC
Start: 2014-10-02 — End: 2014-12-18

## 2014-10-02 NOTE — Telephone Encounter (Signed)
Refilled the following prescription per patient request:      Prozac  20 mg daily, dispense 30 tablets, with 1 refills    [name], MD 10/02/2014, 17:23

## 2014-10-02 NOTE — Progress Notes (Signed)
10/02/14 1456   Age 58 and Older   Right Eye Reading 20/40   Left Eye Reading 20/50   Both/Combined Pass   Reading 20/25   Initials mrocha

## 2014-10-04 ENCOUNTER — Telehealth (HOSPITAL_COMMUNITY): Payer: Self-pay | Admitting: Psychiatry

## 2014-10-04 NOTE — Telephone Encounter (Signed)
Called insurance to obtain prior authorization for the following medication's:    Fluoxetine 20 ng tablet    [name], MD  10/04/2014, 17:20

## 2014-10-08 NOTE — Progress Notes (Signed)
Subjective:     Patient ID:  [name] is an 58 y.o. female   Chief Complaint:    Chief Complaint   Patient presents with    Eye Pain     swelling, worse on left       HPI  58 yr old female who is here for eye symptoms. She states that she was initially seen by the [facility] on 10/27 for conjunctivitis. At that time, she was using polytrim drop. Eye examination was essentially unremarkable with the exception of diffuse scleral injection. She was advised to stop the eye drop by ophthalmology, but she resumed drops because her symptoms were still present. Symptoms are red eyes, itching/irritation, bit of blurred vision. "Burns" when she puts abx drops in her eyes.    PMH/PSH/meds/allergies reviewed and updated in the EMR    Review of Systems   Eyes: Positive for redness, itching and visual disturbance. Negative for photophobia and discharge.     Objective:  Filed Vitals:    10/02/14 1430   BP: 133/70   Pulse: 83   Temp: 36.8 C (98.2 F)   Height: 1.654 m (5' 5.12")   Weight: 100.5 kg (221 lb 9 oz)        Physical Exam   Constitutional: She appears well-developed and well-nourished.   Eyes: EOM are normal. Pupils are equal, round, and reactive to light. Lids are everted and swept, no foreign bodies found. Right eye exhibits no exudate. No foreign body present in the right eye. Left eye exhibits no exudate. No foreign body present in the left eye. Right conjunctiva is injected. Left conjunctiva is injected.   Blepharitis of upper eyelids and lower margins of eye along eyelash line     Skin:   Several papules and pustules on nose and cheeks consistent with rosacea     Ortho Exam    Assessment & Plan:     (H01.003,  H01.006) Blepharitis of both eyes  (primary encounter diagnosis). Given the rosacea on the face, I wonder if this could be manifestation of rosacea. She has allergies to blue dye which is in doxycycline and tetracycline. Did call her pharmacy and minocyline doesn't have blue dye should she  need systemic antibiotic. For now, we discussed lid hygiene and will try topical metronidazole to the face and lids, keeping care to keep out of the eyes themselves.

## 2014-10-25 DIAGNOSIS — I00 Rheumatic fever without heart involvement: Secondary | ICD-10-CM

## 2014-10-25 HISTORY — DX: Rheumatic fever without heart involvement: I00

## 2014-11-05 ENCOUNTER — Encounter (HOSPITAL_BASED_OUTPATIENT_CLINIC_OR_DEPARTMENT_OTHER): Payer: MEDICAID | Admitting: Family Practice-BA

## 2014-11-21 ENCOUNTER — Ambulatory Visit: Payer: MEDICAID | Attending: Family Medicine | Admitting: Family Medicine

## 2014-11-21 DIAGNOSIS — H02403 Unspecified ptosis of bilateral eyelids: Secondary | ICD-10-CM | POA: Insufficient documentation

## 2014-11-21 DIAGNOSIS — R51 Headache: Secondary | ICD-10-CM | POA: Insufficient documentation

## 2014-11-21 DIAGNOSIS — M7150 Other bursitis, not elsewhere classified, unspecified site: Secondary | ICD-10-CM | POA: Insufficient documentation

## 2014-11-21 DIAGNOSIS — R519 Headache, unspecified: Secondary | ICD-10-CM

## 2014-11-21 DIAGNOSIS — G4485 Primary stabbing headache: Secondary | ICD-10-CM | POA: Insufficient documentation

## 2014-11-21 DIAGNOSIS — M7050 Other bursitis of knee, unspecified knee: Secondary | ICD-10-CM

## 2014-11-21 MED ORDER — RIBOFLAVIN (VITAMIN B2) 100 MG TABLET
400.0000 mg | ORAL_TABLET | Freq: Every day | ORAL | Status: DC
Start: 2014-11-21 — End: 2016-04-09

## 2014-11-21 MED ORDER — PANTOPRAZOLE 20 MG TABLET,DELAYED RELEASE
20.0000 mg | DELAYED_RELEASE_TABLET | Freq: Every morning | ORAL | Status: DC
Start: 2014-11-21 — End: 2015-11-03

## 2014-11-21 NOTE — Student (Addendum)
[facility]  Student SOAP note: 11/21/2014    ID: SW is a 59 yo female accompanied by her mother.    CC: Bilateral eye drooping, left eye pain, and left knee pain.     Subjective: SW is a 59 yo female with extensive medical history including depression, rosacea, multinodular goiter, anserine bursitis, and blepharitis. Patient presents today with complaints of worsening, yet chronic left knee pain. She states the knee pain is constant and stabbing and located "behind her knee in the back." She states that her knee is "full of fluid," and is only getting worse. Patient takes 400 mg Tylenol 3x per day.  She states that wearing her hinged knee brace helps with the pain, but it's worn out and she can't afford to buy a new one. She endorses that physical therapy was helpful in the past, but she currently doesn't have transportation and would be unable to attend right now. She also states she previously received cortisone injections in her left knee that were "helpful for only a day or two." Last knee x-ray was in 2014 and unremarkable for osteoarthritis.     Additionally, patient presents with bilateral ptosis and pain that patient believes is worse in her left eye. These symptoms have been occuring for the past 3-4 months. She also complains of intermittent blurry vision that is worse in the morning. Patient states it's hurtful to open her eyes in the morning and that in the evenings her, "eyes feel so weak that they just close on their own." The blurry vision coincides with temporal pain and periorbital pain that lasts 2-3 seconds and occurs roughly 15x per day. She also endorses swollen areas under her eyes and above her eyebrows that are visible on examination. Patient denies recent trauma, watery discharge and discharge of any kind from her eye. She was last seen by opthalmology three months ago in October 2015, and they believed her ptosis was due to thyroid disease. A TSH and free T4 was run  and determined to be normal. No medications or further management was deemed necessary by opthamology.     Patient does not need any medication refills nor does she have any additional questions.     Objective:  BP: 135/80  Pulse: 93  Temp: 36.7 C  Weight: 101.6 kg  Height: 65.118 inches    Medications:   buPROPion (WELLBUTRIN SR) 200 mg Oral Tablet Sustained Release TAKE 1 TABLET BY MOUTH ONCE DAILY   Clobetasol (TEMOVATE) 0.05 % Ointment Apply topically Twice daily To rash on hands and feet   FLUoxetine (PROZAC) 20 mg Oral Tablet Take 1 Tab (20 mg total) by mouth Once a day Tablet necessary, allergy to dye in capsule.   fluticasone (FLONASE) 50 mcg/actuation Nasal Spray, Suspension 2 Sprays by Each Nostril route Once a day.   Halobetasol Propionate (ULTRAVATE) 0.05 % Cream Apply  topically. Three times a week   Ibuprofen (MOTRIN) 400 mg Oral Tablet Take 1 Tab (400 mg total) by mouth Three times a day as needed for Pain   Metronidazole (METROCREAM) 0.75 % Cream Apply topically Twice daily   mineral oil-hydrophil petrolat (AQUAPHOR) Ointment by Apply Topically route Four times a day as needed   nystatin (NYSTOP) 100,000 unit/gram Apply externally Powder Apply once or twice daily to PREVENT rash under breasts   polymyxin B sulf-trimethoprim (POLYTRIM) 10,000 unit- 1 mg/mL Ophthalmic Drops Instill 1 Drop into both eyes Every 6 hours (Patient taking differently: Instill 1 Drop into both  eyes Every 6 hours Began  08/19/14)   triamcinolone acetonide (ARISTOCORT A) 0.1 % Ointment by Apply Topically route Twice daily   pantoprazole (PROTONIX) 20 mg Oral Tablet, Delayed Release (E.C.) Take 1 Tab (20 mg total) by mouth Every morning before breakfast     Physical Exam:  General: Well nourished female in no acute distress.  Head: Normocephalic, atraumatic.  Eyes:  PERLA, fundus appears normal bilaterally, vessels are appreciated. Mild ptosis is noted bilaterally. Skin in periorbital area is mildly swollen and erythematous. No  drainage or leakage appreciated.   Thyroid: No goiters or thyromegaly palpated.   Cardiac:RRR, no murmurs, gallops or murmurs appreciated.  Pulmonary: Clear to ausculation bilaterally.   Extremities: Knees are bilaterally swollen, left popliteal area is exquisitely tender to palpation. No redness or warmth appreciated on exam. No cysts or masses detected. Pulses are present bilaterally in lower extremities. No pallor or cyanosis noted.   Neurologic: Alert and orientated x 3, CN II-XII are intact, normal speech pattern.   Psychologic: Broad affect, congruent mood, normal thought processes.    Assessment/Plan:  1. Ptosis    - Referral to opthalmology for measurement and evaluation of bilateral ptosis.   2. Left Temporal Headache   - MRI to rule out or determine etiology of the eye pain and temporal lobe pain. Potential secondary causes such as mass or lesions should be considered.   - Sed Rate and CBC to rule out possibility of temporal arteritis. If sed rate is elevated, temporal artery biopsy should be considered.   2. Anserine Bursitis   -  Increased tylenol extra-strength dose from 1200 mg daily to 2400 mg daily. Patient should take 800 mg 3x daily for knee pain.   - Orthopedic referral to obtain cortisone injections, provide prescription for hinged knee brace, and re-evaluate left knee for possible osteoarthritis.  3. Follow-up: Return to office in 2 months or PRN.    Morton Amy, MED STUDENT  11/21/2014, 14:52

## 2014-11-21 NOTE — Patient Instructions (Signed)
Riboflavin B2 400 mg daily  Take for migraine, headache prevention

## 2014-11-21 NOTE — Progress Notes (Signed)
ASSESSMENT:   PLAN:    ICD-10-CM    1. Stabbing headache, short lasting neurileptiform headache w/ptosis and eyelid swelling G44.85 MRI BRAIN W/WO CONTRAST   2. Anserine bursitis M71.50 AMB CONSULT/REFERRAL [facility]   3. Ptosis of eyelid, bilateral H02.403 AMB CONSULT/REFERRAL [facility]     MRI BRAIN W/WO CONTRAST   4. Left temporal headache R51 SEDIMENTATION RATE     CBC/DIFF     riboflavin, vitamin B2, (VITAMIN B-2) 100 mg Oral Tablet     MRI BRAIN W/WO CONTRAST     Return in about 5 weeks (around 12/26/2014).  Advised to call back directly if there are further questions, or if these symptoms fail to improve as anticipated or worsen.    SUBJECTIVE:  [name] is a 59 y.o. female is here today for Drooping Eyelid      Left knee pain brace had been helpful for knee pain  Has been having a flare up of knee pain L>>>R --anterior and below joint line on medial side  Thinks they are are swollen      Bilateral eyelids seem to be drooping  L >R  Periorbital pain and some swelling around her eyes  Blurry vision  No other facial weakness  Describes as a stabbing pain around the orbit of her left eye happens for 1-2 seconds at a time but over and over through the day  No double vision  The stabbing headaches have been occurring for a few months with increasing frequency      Medication understanding addressed.      I have reviewed the patient's medical, surgical, family and social history in detail today, 12/01/2014  and updated the computerized patient record as appropriate.  has a current medication list which includes the following prescription(s): bupropion, clobetasol, diclofenac sodium, fluoxetine, fluticasone, halobetasol propionate, ibuprofen, metronidazole, mineral oil-hydrophil petrolat, nystatin, pantoprazole, polymyxin b sulf-trimethoprim, riboflavin (vitamin b2), and triamcinolone acetonide.   ROS: as above  OBJECTIVE:   Vitals: There were no vitals taken for this visit.   Appearance:in  no apparent distress  Exam: Lungs: clear to auscultation bilaterally, normal percussion bilaterally  Heart: regular rate and rhythm, S1, S2 normal, no murmur, click, rub or gallop  Extremities: left knee with tenderness over anserine bursa  No effusion    Neurologic: Alert and oriented X 3, normal strength and tone. Normal symmetric reflexes. Normal coordination and gait  She does have bilateral ptosis L>>R but motor intact, fundi benign     [name], MD  [place]  [facility]ated by [facility]  [place] [address]  Dept: [phone]  Dept Fax: [phone]

## 2014-11-28 ENCOUNTER — Ambulatory Visit (HOSPITAL_BASED_OUTPATIENT_CLINIC_OR_DEPARTMENT_OTHER): Payer: MEDICAID | Admitting: Radiology

## 2014-11-28 ENCOUNTER — Ambulatory Visit (HOSPITAL_BASED_OUTPATIENT_CLINIC_OR_DEPARTMENT_OTHER)
Admission: RE | Admit: 2014-11-28 | Discharge: 2014-11-28 | Disposition: A | Discharge status: Home / Self Care | Payer: MEDICAID | Source: Ambulatory Visit | Attending: Family Medicine | Admitting: Family Medicine

## 2014-11-28 ENCOUNTER — Ambulatory Visit (HOSPITAL_BASED_OUTPATIENT_CLINIC_OR_DEPARTMENT_OTHER): Payer: MEDICAID | Admitting: Orthopaedic Surgery

## 2014-11-28 ENCOUNTER — Encounter (HOSPITAL_BASED_OUTPATIENT_CLINIC_OR_DEPARTMENT_OTHER): Payer: Self-pay | Admitting: Orthopaedic Surgery

## 2014-11-28 ENCOUNTER — Ambulatory Visit
Admission: RE | Admit: 2014-11-28 | Discharge: 2014-11-28 | Disposition: A | Discharge status: Home / Self Care | Payer: MEDICAID | Source: Ambulatory Visit | Attending: Orthopaedic Surgery | Admitting: Orthopaedic Surgery

## 2014-11-28 VITALS — BP 146/94 | HR 100 | Temp 99.0°F | Ht 65.0 in | Wt 224.0 lb

## 2014-11-28 DIAGNOSIS — Z8249 Family history of ischemic heart disease and other diseases of the circulatory system: Secondary | ICD-10-CM | POA: Insufficient documentation

## 2014-11-28 DIAGNOSIS — M79606 Pain in leg, unspecified: Secondary | ICD-10-CM

## 2014-11-28 DIAGNOSIS — M129 Arthropathy, unspecified: Secondary | ICD-10-CM

## 2014-11-28 DIAGNOSIS — Z885 Allergy status to narcotic agent status: Secondary | ICD-10-CM | POA: Insufficient documentation

## 2014-11-28 DIAGNOSIS — Z809 Family history of malignant neoplasm, unspecified: Secondary | ICD-10-CM | POA: Insufficient documentation

## 2014-11-28 DIAGNOSIS — M2578 Osteophyte, vertebrae: Secondary | ICD-10-CM

## 2014-11-28 DIAGNOSIS — M71561 Other bursitis, not elsewhere classified, right knee: Secondary | ICD-10-CM | POA: Insufficient documentation

## 2014-11-28 DIAGNOSIS — M170 Bilateral primary osteoarthritis of knee: Secondary | ICD-10-CM

## 2014-11-28 DIAGNOSIS — L409 Psoriasis, unspecified: Secondary | ICD-10-CM | POA: Insufficient documentation

## 2014-11-28 DIAGNOSIS — F319 Bipolar disorder, unspecified: Secondary | ICD-10-CM | POA: Insufficient documentation

## 2014-11-28 DIAGNOSIS — M7050 Other bursitis of knee, unspecified knee: Secondary | ICD-10-CM

## 2014-11-28 DIAGNOSIS — Z833 Family history of diabetes mellitus: Secondary | ICD-10-CM | POA: Insufficient documentation

## 2014-11-28 DIAGNOSIS — M7150 Other bursitis, not elsewhere classified, unspecified site: Secondary | ICD-10-CM

## 2014-11-28 DIAGNOSIS — R519 Headache, unspecified: Secondary | ICD-10-CM

## 2014-11-28 DIAGNOSIS — Z8261 Family history of arthritis: Secondary | ICD-10-CM | POA: Insufficient documentation

## 2014-11-28 DIAGNOSIS — M25461 Effusion, right knee: Secondary | ICD-10-CM

## 2014-11-28 DIAGNOSIS — Z90710 Acquired absence of both cervix and uterus: Secondary | ICD-10-CM | POA: Insufficient documentation

## 2014-11-28 DIAGNOSIS — S8992XA Unspecified injury of left lower leg, initial encounter: Secondary | ICD-10-CM | POA: Insufficient documentation

## 2014-11-28 DIAGNOSIS — R51 Headache: Principal | ICD-10-CM

## 2014-11-28 DIAGNOSIS — M25462 Effusion, left knee: Secondary | ICD-10-CM

## 2014-11-28 DIAGNOSIS — Z9049 Acquired absence of other specified parts of digestive tract: Secondary | ICD-10-CM | POA: Insufficient documentation

## 2014-11-28 DIAGNOSIS — M71562 Other bursitis, not elsewhere classified, left knee: Secondary | ICD-10-CM | POA: Insufficient documentation

## 2014-11-28 DIAGNOSIS — Z79899 Other long term (current) drug therapy: Secondary | ICD-10-CM | POA: Insufficient documentation

## 2014-11-28 DIAGNOSIS — M79605 Pain in left leg: Secondary | ICD-10-CM

## 2014-11-28 LAB — CBC/DIFF
BASOS ABS: 0.085 10*3/uL (ref 0.000–0.200)
EOS ABS: 0.172 10*3/uL (ref 0.000–0.500)
EOSINOPHIL: 2 %
HCT: 41.7 % (ref 33.5–45.2)
HCT: 41.7 % (ref 33.5–45.2)
HGB: 14 g/dL (ref 11.2–15.2)
LYMPHOCYTES: 35 %
LYMPHS ABS: 2.594 10*3/uL (ref 1.000–4.800)
MCH: 28.3 pg (ref 27.4–33.0)
MCHC: 33.4 g/dL (ref 32.5–35.8)
MCV: 84.8 fL (ref 78–100)
MONOCYTES: 8 %
MONOS ABS: 0.597 10*3/uL (ref 0.300–1.000)
MPV: 7.2 fL — ABNORMAL LOW (ref 7.5–11.5)
PLATELET COUNT: 385 10*3/uL (ref 140–450)
PMN ABS: 3.91 10*3/uL (ref 1.500–7.700)
PMN'S: 54 %
RBC: 4.92 MIL/uL (ref 3.63–4.92)
RDW: 14.4 % (ref 12.0–15.0)
WBC: 7.4 10*3/uL (ref 3.5–11.0)

## 2014-11-28 LAB — SEDIMENTATION RATE: SEDIMENTATION RATE: 23 mm/hr (ref 0–30)

## 2014-11-28 MED ORDER — MELOXICAM 15 MG TABLET
15.0000 mg | ORAL_TABLET | Freq: Every day | ORAL | Status: DC
Start: 2014-11-28 — End: 2014-11-28

## 2014-11-28 MED ORDER — DICLOFENAC SODIUM 75 MG TABLET,DELAYED RELEASE
75.0000 mg | DELAYED_RELEASE_TABLET | Freq: Every day | ORAL | Status: DC
Start: 2014-11-28 — End: 2014-12-23

## 2014-11-28 NOTE — Addendum Note (Signed)
Addended by: Kelton Pillar on: 11/28/2014 04:37 PM     Modules accepted: Orders

## 2014-11-28 NOTE — Addendum Note (Signed)
Addended by: Kelton Pillar on: 11/28/2014 04:02 PM     Modules accepted: Orders

## 2014-11-28 NOTE — H&P (Signed)
[place], [place] [address]                                PATIENT NAME: [name], [name] [id number]  DATE OF SERVICE:11/28/2014  DATE OF BIRTH: 1956-05-12    HISTORY AND PHYSICAL    CHIEF COMPLAINT:  Bilateral knee bursitis.    HISTORY OF PRESENT ILLNESS:  [name] is a pleasant 59 year old female who presents today for evaluation of bilateral knee pes bursitis.  She does have a history of ongoing bursitis for which she has tried some conservative treatment in the past including physical therapy, but it sounds like this may have been done more so for what was suspected to be degenerative changes of her knees.  On her left knee, she had sustained an injury a few years ago and since that point has been somewhat painful chronically including some feelings of instability from time to time.  However, her biggest complaint today is bilateral pes bursitis.  She has significant pain and tenderness along the medial tibial proximal metaphysis bilaterally.  She has not had any corticosteroid injections for this.  She has taken anti-inflammatories in the past without much relief.  She describes the pain occasionally as 7/10.  She does describe using an upright knee brace on the left side, which she used for a couple years successfully until she lost some weight and it does not fit anymore.    PAST MEDICAL HISTORY:    1.  Psoriasis:  2.  Depression.  3.  Bipolar disorder.  4.  Left knee injury.    PAST SURGICAL HISTORY:  1.  Tonsillectomy.  2.  Cholecystectomy.  3.  Appendectomy.  4.  Hysterectomy.    MEDICATIONS:  1.  Ibuprofen 800 mg 3 times a day.  2.  Prozac 20 mg daily.  3.  Wellbutrin 200 mg daily.  4.  Steroid cream.  5.  Protonix 20 mg daily.  6.  Vitamin D2.    ALLERGIES:  The patient is allergic to codeine and blue dye.    FAMILY HISTORY:  Significant  for heart disease, cancer, diabetes and arthritis.    SOCIAL HISTORY:  She is not employed at this point in time.  She does have stairs at home.  She is not using a cane or walker.  At this point in time, she has limited her activities mostly to walking and sitting.  She does not smoke or drink.    REVIEW OF SYSTEMS:  Positive for nighttime pain in the knee and also numbness and tingling that does radiate down her left lower extremity from time-to-time and some joint stiffness.  It is also positive for night sweats and depression and negative for all other systems on review.    OBJECTIVE:  On exam today, she stands 5 feet 5 inches tall, she weighs 101 kg, blood pressure 146/94, pulse 100 beats per minute, temperature is 37.2 degrees Celsius.  She is in no acute distress.  She is alert and oriented x3.  Her mood is  appropriate for the clinical situation.  She is normocephalic and atraumatic.  Pupils equal and round.  Trachea is midline.  Her breathing is unlabored.  Heart rate is regular.  Abdomen is nondistended.  Evaluation of the patient's right lower extremity reveals 2+ DP and PT pulses.  Motor and sensory function appears to be intact to light touch.  Range of movement of the patient's right hip is completely intact and nonpainful.  The patient's right knee demonstrates tenderness along the medial tibial metaphysis proximally.  There is no tenderness along the joint line itself.  Range of motion of the knee from full extension to 120 degrees of flexion.  The knee is stable to varus and valgus stress.  Evaluation of the left lower extremity reveals 2+ DP and PT pulses.  Sensory and motor function is intact to light touch.  She has no gross effusion of the left knee today.  Range of motion is from near full extension to approximately 115 degrees of flexion.  She does have some pain medially with a varus stress and there is some slight opening laterally with that varus stress.  Once again, pes bursa does appear to  be inflamed and swollen.  Joint line tenderness is fairly minimal on the left side.    IMAGING:  X-rays of both knees were reviewed today and per my interpretation demonstrate really no bony abnormality.  There are perhaps minimal degenerative changes in both knees with a slight loss of joint space, but no osteophyte formation or subchondral sclerosis is noted.    ASSESSMENT:    1.  Bilateral pes bursitis.  2.  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    PLAN:  As far as her left knee goes, I am going to have a new knee brace fitted for her today since it has done well for her in the past.  At this point, I do not think she is a candidate for joint replacement given her limited radiographic findings.  We did obtain x-rays of both hips as well as lumbar spine which failed to demonstrate any significant degenerative changes that would explain the possible tingling in the left lower extremity.  If she continues to have those symptoms of numbness and tingling an MRI of her lumbar spine may be warranted.  I discussed with her for the pes bursitis, I did offer a corticosteroid injection bilaterally.  She was amenable to this.  Under sterile conditions, 1 mL of 1% lidocaine, 1 mL of 0.5% Marcaine and 8 mg of dexamethasone was injected on first the right and then left pes bursa under sterile conditions.  She tolerated the procedure well.  There were no complications.  I will send her for some physical therapy to work on range of motion of both knees as well as modalities and iono- or phonophoresis.  We will as above obtain a brace for left knee and I will plan to see her back in 2 months for repeat evaluation.      Malon Kindle, MD  Assistant Professor  [facility] of Orthopaedics    RC/VE/[id number]; D: 11/28/2014 16:47:25; T: 11/28/2014 18:22:12    cc: [name] MD      [place], [place] [id number]

## 2014-11-30 ENCOUNTER — Other Ambulatory Visit (HOSPITAL_COMMUNITY): Payer: Self-pay | Admitting: Psychiatry

## 2014-12-11 ENCOUNTER — Ambulatory Visit
Admission: RE | Admit: 2014-12-11 | Discharge: 2014-12-11 | Disposition: A | Discharge status: Home / Self Care | Payer: MEDICAID | Source: Ambulatory Visit | Attending: Family Medicine | Admitting: Family Medicine

## 2014-12-11 DIAGNOSIS — H02403 Unspecified ptosis of bilateral eyelids: Secondary | ICD-10-CM

## 2014-12-11 DIAGNOSIS — R519 Headache, unspecified: Secondary | ICD-10-CM

## 2014-12-11 DIAGNOSIS — G4485 Primary stabbing headache: Secondary | ICD-10-CM

## 2014-12-11 DIAGNOSIS — D490 Neoplasm of unspecified behavior of digestive system: Secondary | ICD-10-CM

## 2014-12-11 DIAGNOSIS — R51 Headache: Secondary | ICD-10-CM

## 2014-12-11 MED ORDER — GADOBENATE DIMEGLUMINE 529 MG/ML(0.1 MMOL/0.2 ML) INTRAVENOUS SOLUTION
20.00 mL | INTRAVENOUS | Status: AC
Start: 2014-12-11 — End: 2014-12-11
  Administered 2014-12-11: 12:00:00 20 mL via INTRAVENOUS

## 2014-12-18 ENCOUNTER — Other Ambulatory Visit (HOSPITAL_COMMUNITY): Payer: Self-pay | Admitting: Psychiatry

## 2014-12-18 ENCOUNTER — Telehealth (HOSPITAL_BASED_OUTPATIENT_CLINIC_OR_DEPARTMENT_OTHER): Payer: Self-pay | Admitting: Family Medicine

## 2014-12-18 DIAGNOSIS — K118 Other diseases of salivary glands: Secondary | ICD-10-CM

## 2014-12-18 NOTE — Telephone Encounter (Signed)
mRI found right sided nodule in parotid   Discussed with patient today  She noticed some swelling in that area in the last few days and occasionally shooting pain  Will get her in with ENT soon and plans to keep follow up with me next week  [name], MD

## 2014-12-20 ENCOUNTER — Encounter (INDEPENDENT_AMBULATORY_CARE_PROVIDER_SITE_OTHER): Payer: Self-pay | Admitting: Otolaryngology

## 2014-12-20 ENCOUNTER — Ambulatory Visit: Payer: MEDICAID | Attending: Otolaryngology | Admitting: Otolaryngology

## 2014-12-20 VITALS — BP 118/82 | HR 91 | Temp 97.1°F | Ht 65.0 in | Wt 222.2 lb

## 2014-12-20 DIAGNOSIS — F329 Major depressive disorder, single episode, unspecified: Secondary | ICD-10-CM | POA: Insufficient documentation

## 2014-12-20 DIAGNOSIS — G43909 Migraine, unspecified, not intractable, without status migrainosus: Secondary | ICD-10-CM | POA: Insufficient documentation

## 2014-12-20 DIAGNOSIS — K219 Gastro-esophageal reflux disease without esophagitis: Secondary | ICD-10-CM | POA: Insufficient documentation

## 2014-12-20 DIAGNOSIS — E785 Hyperlipidemia, unspecified: Secondary | ICD-10-CM | POA: Insufficient documentation

## 2014-12-20 DIAGNOSIS — K118 Other diseases of salivary glands: Secondary | ICD-10-CM

## 2014-12-20 DIAGNOSIS — R220 Localized swelling, mass and lump, head: Secondary | ICD-10-CM | POA: Insufficient documentation

## 2014-12-20 DIAGNOSIS — Z87891 Personal history of nicotine dependence: Secondary | ICD-10-CM | POA: Insufficient documentation

## 2014-12-20 NOTE — H&P (Addendum)
PATIENT NAME:  [name]  MRN:  [id number]  DOB:  08/29/1956  DATE OF SERVICE: 12/20/2014    Chief Complaint:  Mass      HPI:  [name] is a 59 y.o. female who is referred by her PCP for a right parotid mass.  The patient states she has been having drooping of both of her eyes and also headaches.  She states the MRI scan was ordered to further evaluate this.  She has had pain in the right side of her face and ear for the last 3-4 months.  She denies any other ENT symptoms, such as difficulty swallowing, voice change, shortness of breath, fevers, chills, night sweats or unexplained weight loss.  She has no other complaints today.      Past Medical History:  Past Medical History   Diagnosis Date    depression     Multinodular goiter     Headache(784.0)      Migraines    Arthritis     Vaginal prolapse     Rheumatic fever 16     no complications    Depression     GERD (gastroesophageal reflux disease)      controlled w/med    Wears glasses     Wears dentures     Contact dermatitis and other eczema, due to unspecified cause      allergic reaction to detergent    MINOR CAD (coronary artery disease) 11/10/2012    Hyperlipidemia LDL goal < 100 08/29/2013           Past Surgical History:  Past Surgical History   Procedure Laterality Date    Pb upper gi endoscopy,biopsy  12/19/07     patulous GE junction zone, erythema, nonerosive GERD    Hx ankle fracture tx  2007     left distal fibula, casted    Hx wrist fracture tx  2007     [place] injury    Hx partial hysterectomy      Colonoscopy  01/06/09     COLONOSCOPY performed by Loletta Parish F at [facility]    Hx cystocele repair  09/24/09    Hx gall bladder surgery/chole      Hx adenoidectomy      Hx tonsillectomy      Gastroscopy  03/05/2010     GASTROSCOPY performed by Jocelyn Lamer at [facility] with biopsy  03/05/2010     GASTROSCOPY WITH BIOPSY performed by Jocelyn Lamer at [facility]    Pb revise ulnar nerve at elbow  1979     Hx hysterectomy      Hx appendectomy      Hx colonoscopy      Hx hand surgery      Hx no surgical procedures      Hx other             Family History:  Family History   Problem Relation Age of Onset    Congestive Heart Failure Father 46     died at 52    Coronary Artery Disease Father     Heart Attack Father     Heart Attack Sister      33    Hypertension Brother     Hypertension Sister     Hypertension Sister     Kidney Disease Sister      ESRD    Stroke Paternal Grandmother     Diabetes Mother  High Cholesterol Mother     Hypertension Mother     Coronary Artery Disease Mother 41     CABG    Thyroid Disease Sister     Bipolar Disorder Daughter     Cancer Paternal Aunt      Breast Ca    Cancer Paternal Aunt      Breast Ca    Cancer Paternal Aunt      Breast Ca    Cancer Paternal Aunt      Breast Ca    Cancer Paternal Aunt      Breast Ca    Cancer Paternal Uncle      Leukemia    Cancer Other 52     Breast Ca/Breast Ca           Social History:  History   Smoking status    Former Smoker -- 2.00 packs/day for 40 years    Types: Cigarettes    Quit date: 10/14/2005   Smokeless tobacco    Never Used     Comment: quit 2007     History   Alcohol Use No     Social History     Occupational History    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 Employed       Medications:  Outpatient Prescriptions Marked as Taking for the 12/20/14 encounter (Office Visit) with Delfino Lovett, MD   Medication Sig    buPROPion (WELLBUTRIN SR) 200 mg Oral Tablet Sustained Release TAKE 1 TABLET BY MOUTH ONCE DAILY    Clobetasol (TEMOVATE) 0.05 % Ointment Apply topically Twice daily To rash on hands and feet    diclofenac sodium (VOLTAREN) 75 mg Oral Tablet, Delayed Release (E.C.) Take 1 Tab (75 mg total) by mouth Once a day    FLUoxetine (PROZAC) 20 mg Oral Tablet TAKE 1 TABLET BY MOUTH ONCE A DAY (TABLET NECESSARY, ALLERGY TO DYE IN CAPSULE)    fluticasone (FLONASE) 50 mcg/actuation Nasal Spray, Suspension 2 Sprays by Each  Nostril route Once a day.    Ibuprofen (MOTRIN) 400 mg Oral Tablet Take 1 Tab (400 mg total) by mouth Three times a day as needed for Pain    Metronidazole (METROCREAM) 0.75 % Cream Apply topically Twice daily    mineral oil-hydrophil petrolat (AQUAPHOR) Ointment by Apply Topically route Four times a day as needed    nystatin (NYSTOP) 100,000 unit/gram Apply externally Powder Apply once or twice daily to PREVENT rash under breasts    pantoprazole (PROTONIX) 20 mg Oral Tablet, Delayed Release (E.C.) Take 1 Tab (20 mg total) by mouth Every morning before breakfast    polymyxin B sulf-trimethoprim (POLYTRIM) 10,000 unit- 1 mg/mL Ophthalmic Drops Instill 1 Drop into both eyes Every 6 hours (Patient taking differently: Instill 1 Drop into both eyes Every 6 hours Began  08/19/14)    riboflavin, vitamin B2, (VITAMIN B-2) 100 mg Oral Tablet Take 4 Tabs (400 mg total) by mouth Once a day    triamcinolone acetonide (ARISTOCORT A) 0.1 % Ointment by Apply Topically route Twice daily       Allergies:  Allergies   Allergen Reactions    Cymbalta [Duloxetine]      Rash on feet and hands    Blue Dye      Told to avoid due to patch test result by Simone Curia    Mobic [Meloxicam] Nausea/ Vomiting    Nickel     Naprosyn [Naproxen] Nausea/ Vomiting       Review of Systems:  Do you have  any fevers: no   Any weight change: no   Change in your vision: yes Explain Vision: pt states vision is blurry  Chest Pain: no   Shortness of Breath: no   Stomach pain: no   Urinary difficulity: no   Joint Pain: yes Explain Joint Pain: knees and back Skin Problems: yes Explain Skin Problems: roscea and ezcema Weakness or Numbness: no   Easy Bruising or Bleeding: no   Excessive Thirst: yes Explain Excessive Thirst: pt states she has dry mouth Seasonal Allergies: yes Explain Seasonal Allergies: change of seasons  All other systems reviewed and found to be negative.    Physical Exam:  Blood pressure 118/82, pulse 91, temperature 36.2 C (97.1 F),  temperature source Thermal Scan, height 1.651 m (5\' 5" ), weight 100.8 kg (222 lb 3.6 oz).  Body mass index is 36.98 kg/(m^2).  General Appearance: Pleasant, cooperative, healthy, and in no acute distress.  Eyes: Conjunctivae/corneas clear, PERRLA, EOM's intact.  Head and Face: Normocephalic, atraumatic.  Face symmetric, no obvious lesions.   Pinnae: Normal shape and position.   External auditory canals:  Patent without inflammation.  Tympanic membranes:  Intact, translucent, midposition, middle ear aerated.  Nose:  External pyramid midline. Septum midline. Mucosa normal. No purulence, polyps, or crusts.   Oral Cavity/Oropharynx: No mucosal lesions, masses, or pharyngeal asymmetry.  Hypopharynx/Larynx: Indirect mirror laryngoscopy revealed no hypopharyngeal or laryngeal masses or lesions, normal laryngeal mobility, and voice normal.  Neck:  Pain to palpation of the right parotid gland, No palpable thyroid, salivary gland, or neck masses.  Heme/Lymph:  No cervical adenopathy.  Cardiovascular:  Good perfusion of upper extremities.  No cyanosis of the hands or fingers.  Lungs: No apparent stridorous breathing. No acute distress.  Skin: Skin warm and dry.  Neurologic: Cranial nerves:  grossly intact.  Psychiatric:  Alert and oriented x 3.    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  MRI scan was reviewed by Dr. Posey Pronto.        Plan:  Dr. Posey Pronto had a discussion with the patient.  She was advised that she needs an ultrasound-guided needle biopsy.  We will bring her back 1 week after for a discussion of pathology and surgery.  The patient states good understanding and she is in agreement.      Pt seen in clinic with Dr. Anders Grant, PA-C 12/20/2014, 15:07    I personally saw and evaluated the patient. See mid-level's note for additional details. My findings/particpation are: patient with parotid mass. New pain for 3-4 months. Swelling on exam. FN intact. Will plan on u/s bx and follow up after this.       Delfino Lovett, MD 12/20/2014, 16:17        PCP:  [name], MD  [address]  [place] [id number]   REF:  [name], [place],  [phone]

## 2014-12-23 ENCOUNTER — Ambulatory Visit (INDEPENDENT_AMBULATORY_CARE_PROVIDER_SITE_OTHER): Payer: Self-pay | Admitting: Ophthalmology

## 2014-12-23 ENCOUNTER — Encounter (HOSPITAL_BASED_OUTPATIENT_CLINIC_OR_DEPARTMENT_OTHER): Payer: Self-pay | Admitting: Family Medicine

## 2014-12-23 ENCOUNTER — Encounter (INDEPENDENT_AMBULATORY_CARE_PROVIDER_SITE_OTHER): Payer: Self-pay

## 2014-12-23 ENCOUNTER — Ambulatory Visit: Payer: MEDICAID | Attending: Family Medicine | Admitting: Family Medicine

## 2014-12-23 VITALS — BP 134/74 | HR 90 | Temp 97.2°F | Ht 65.12 in | Wt 223.3 lb

## 2014-12-23 DIAGNOSIS — K118 Other diseases of salivary glands: Secondary | ICD-10-CM

## 2014-12-23 DIAGNOSIS — H02403 Unspecified ptosis of bilateral eyelids: Secondary | ICD-10-CM | POA: Insufficient documentation

## 2014-12-23 DIAGNOSIS — I2510 Atherosclerotic heart disease of native coronary artery without angina pectoris: Secondary | ICD-10-CM | POA: Insufficient documentation

## 2014-12-23 DIAGNOSIS — H5713 Ocular pain, bilateral: Secondary | ICD-10-CM | POA: Insufficient documentation

## 2014-12-23 DIAGNOSIS — R220 Localized swelling, mass and lump, head: Secondary | ICD-10-CM

## 2014-12-23 DIAGNOSIS — Z760 Encounter for issue of repeat prescription: Secondary | ICD-10-CM | POA: Insufficient documentation

## 2014-12-23 MED ORDER — ATENOLOL 25 MG TABLET
25.0000 mg | ORAL_TABLET | Freq: Every day | ORAL | Status: DC
Start: 2014-12-23 — End: 2016-01-12

## 2014-12-23 NOTE — Telephone Encounter (Signed)
-----   Message from Leitha Bleak sent at 12/23/2014 10:18 AM EST -----  >> STEPHANIE GAMBLE 12/23/2014 10:18 AM  Melodi from family medicine called and would like to know if this pt could be worked in asap for eye Pain Dr Rodney Booze is wanting her seen asap please call pt thanks

## 2014-12-23 NOTE — Telephone Encounter (Signed)
Called and spoke with pt , scheduled with Dr Laurance Flatten for 3/2 at 8 am  [place]  12/23/2014, 10:31

## 2014-12-23 NOTE — Progress Notes (Signed)
ASSESSMENT:   PLAN:    ICD-10-CM    1. Eye pain, bilateral H57.13 AMB CONSULT/REFERRAL NEUROLOGY       atenolol (TENORMIN) 25 mg Oral Tablet one daily (started new medication today, after discussion of expected benefits and potential side effects)  Since she has migraines and now has this constant retrobulbar pain     2. 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  No diplopia.  Will get Neuro opinion     4. 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  Elevated Blood Pressure Plan of Care:  Prehypertensive Plan of care: Rescreen BP in 1 months    I spent 20 minutes out of 25 minutes counseling her regarding risks and benefits of various treatment/ management options.    Return in about 1 month (around 01/21/2015).  Advised to call back directly if there are further questions, or if these symptoms fail to improve as anticipated or worsen.    SUBJECTIVE:  [name] is a 59 y.o. female is here today for FOLLOW UP MED CHECK      Pain behind both eyes constant for months, still has ptosis, especially noticeable when she first wakes up in th eAM  No diplopia  No other weakness  Taking riboflavin since last visit  No other migraine type headaches lately    Saw ENT for parotid mass on right found incidentally on brain mri  Biopsy planned for tomorrow    Medication understanding addressed.      I have reviewed the patient's medical, surgical, family and social history in detail today, 12/23/2014  and updated the computerized patient record as appropriate.  has a current medication list which includes the following  prescription(s): atenolol, bupropion, clobetasol, fluoxetine, fluticasone, halobetasol propionate, ibuprofen, metronidazole, mineral oil-hydrophil petrolat, nystatin, pantoprazole, polymyxin b sulf-trimethoprim, riboflavin (vitamin b2), and triamcinolone acetonide.   ROS: as above  OBJECTIVE:   Vitals: BP 134/74 mmHg  Pulse 90  Temp(Src) 36.2 C (97.2 F) (Thermal Scan)  Ht 1.654 m (5' 5.12")  Wt 101.3 kg (223 lb 5.2 oz)  BMI 37.03 kg/m2  SpO2 96%   Appearance:appears older than her stated age  Exam: Eyes: conjunctiva normal, slight bilateral ptosis vs. Redundant soft tissue above eyes  TSH was normal   EOMI  PERRL  Right parotid area with soft tissue swelling and mild tenderness     [name], MD  [place]  Operated by [facility]  [place] [address]  Dept: [phone]  Dept Fax: [phone]

## 2014-12-24 ENCOUNTER — Other Ambulatory Visit (INDEPENDENT_AMBULATORY_CARE_PROVIDER_SITE_OTHER): Payer: Self-pay | Admitting: DIAGNOSTIC RADIOLOGY

## 2014-12-24 ENCOUNTER — Ambulatory Visit
Admission: RE | Admit: 2014-12-24 | Discharge: 2014-12-24 | Disposition: A | Discharge status: Home / Self Care | Payer: MEDICAID | Source: Ambulatory Visit | Attending: DIAGNOSTIC RADIOLOGY | Admitting: DIAGNOSTIC RADIOLOGY

## 2014-12-24 ENCOUNTER — Ambulatory Visit (HOSPITAL_BASED_OUTPATIENT_CLINIC_OR_DEPARTMENT_OTHER)
Admission: RE | Admit: 2014-12-24 | Discharge: 2014-12-24 | Disposition: A | Discharge status: Home / Self Care | Payer: MEDICAID | Source: Ambulatory Visit | Attending: DIAGNOSTIC RADIOLOGY | Admitting: DIAGNOSTIC RADIOLOGY

## 2014-12-24 ENCOUNTER — Ambulatory Visit (INDEPENDENT_AMBULATORY_CARE_PROVIDER_SITE_OTHER)
Admission: RE | Admit: 2014-12-24 | Discharge: 2014-12-24 | Disposition: A | Discharge status: Home / Self Care | Payer: MEDICAID | Source: Ambulatory Visit

## 2014-12-24 DIAGNOSIS — R220 Localized swelling, mass and lump, head: Secondary | ICD-10-CM

## 2014-12-24 DIAGNOSIS — C07 Malignant neoplasm of parotid gland: Secondary | ICD-10-CM

## 2014-12-24 DIAGNOSIS — D490 Neoplasm of unspecified behavior of digestive system: Secondary | ICD-10-CM | POA: Insufficient documentation

## 2014-12-24 DIAGNOSIS — K118 Other diseases of salivary glands: Secondary | ICD-10-CM

## 2014-12-25 ENCOUNTER — Ambulatory Visit (HOSPITAL_BASED_OUTPATIENT_CLINIC_OR_DEPARTMENT_OTHER): Payer: MEDICAID | Admitting: Ophthalmology

## 2014-12-25 ENCOUNTER — Encounter (INDEPENDENT_AMBULATORY_CARE_PROVIDER_SITE_OTHER): Payer: Self-pay | Admitting: Ophthalmology

## 2014-12-25 ENCOUNTER — Ambulatory Visit
Admission: RE | Admit: 2014-12-25 | Discharge: 2014-12-25 | Disposition: A | Discharge status: Home / Self Care | Payer: MEDICAID | Source: Ambulatory Visit | Attending: Ophthalmology | Admitting: Ophthalmology

## 2014-12-25 DIAGNOSIS — H52223 Regular astigmatism, bilateral: Secondary | ICD-10-CM | POA: Insufficient documentation

## 2014-12-25 DIAGNOSIS — H04123 Dry eye syndrome of bilateral lacrimal glands: Secondary | ICD-10-CM | POA: Insufficient documentation

## 2014-12-25 DIAGNOSIS — H5203 Hypermetropia, bilateral: Secondary | ICD-10-CM | POA: Insufficient documentation

## 2014-12-25 DIAGNOSIS — Z87891 Personal history of nicotine dependence: Secondary | ICD-10-CM | POA: Insufficient documentation

## 2014-12-25 DIAGNOSIS — I2510 Atherosclerotic heart disease of native coronary artery without angina pectoris: Secondary | ICD-10-CM

## 2014-12-25 DIAGNOSIS — H524 Presbyopia: Secondary | ICD-10-CM | POA: Insufficient documentation

## 2014-12-25 LAB — LIPID PANEL
CHOLESTEROL: 194 mg/dL (ref <200)
CHOLESTEROL: 194 mg/dL (ref <200)
HDL-CHOLESTEROL: 34 mg/dL — ABNORMAL LOW (ref >49)
LDL (CALCULATED): 122 mg/dL — ABNORMAL HIGH (ref <100)
NON - HDL (CALCULATED): 160 mg/dL (ref <190)

## 2014-12-25 MED ORDER — FLUOROMETHOLONE 0.1 % EYE DROPS,SUSPENSION
1.00 [drp] | Freq: Two times a day (BID) | OPHTHALMIC | Status: DC
Start: 2014-12-25 — End: 2015-12-10

## 2014-12-25 NOTE — Progress Notes (Addendum)
[place]  Operated by [facility]  [place] [address]  Dept: [phone]    Patient Name: [name]  MRN#: [id number]  [place]: August 09, 1956    Date of Service: 12/25/2014    Chief Complaint     Blurred Vision; Eye Pain          [name] is a 59 y.o. female who presents today for evaluation/consultation of:  HPI     Blurred Vision   In both eyes.  This started 3 months ago.  Occurring constantly.  It is worse in the morning and in the evening.  Since onset it is gradually worsening.  Associated symptoms include eye pain, redness and headache.  Negative for tearing.  Treatments tried include warm compresses.  Response to treatment was mild improvement.           Eye Pain     In both eyes.  Characterized as irritation and burning.  Pain was noted as 8/10.  Occurring constantly.  It is worse in the morning.  Duration of 3 months.  Since onset it is gradually worsening.  Associated symptoms include redness, headaches and blurred vision.  Negative for discharge, itching and tearing.           Comments   New pt visit for blurry vision in AM in OU occurring for 3-4 months, referred here by Dr. Rodney Booze. Pt states in the AM OU are burning and has to use warm soaks on lids, occurring for 3-4 months and getting worse.   Pt states has droopy lids OU, states thinks causing straining on the eyes to hold lids up. Pt thinks this is causing daily headaches.   Pt not using any eye drops at this time.          Last edited by Betsey Amen, Ophthalmic T on 12/25/2014  8:09 AM. (History)        ROS     Positive for: Neurological (h/o migraines), Eyes (droopy lids, blurry vision, burning)    Negative for: Constitutional, Gastrointestinal, Skin, Genitourinary, Musculoskeletal, HENT, Endocrine, Cardiovascular, Respiratory, Psychiatric, Allergic/Imm, Heme/Lymph    Last edited by Betsey Amen, Ophthalmic T on 12/25/2014  8:09 AM. (History)           Betsey Amen, Ophthalmic T 12/25/2014, 08:11     Past Surgical History   Procedure Laterality Date    Pb upper gi endoscopy,biopsy  12/19/07     patulous GE junction zone, erythema, nonerosive GERD    Hx ankle fracture tx  2007     left distal fibula, casted    Hx wrist fracture tx  2007     [place] injury    Hx partial hysterectomy      Colonoscopy  01/06/09     COLONOSCOPY performed by Murvin Donning, [facility] F at [place]    Hx cystocele repair  09/24/09    Hx gall bladder surgery/chole      Hx adenoidectomy      Hx tonsillectomy      Gastroscopy  03/05/2010     GASTROSCOPY performed by Jocelyn Lamer at [place] with biopsy  03/05/2010     GASTROSCOPY WITH BIOPSY performed by Jocelyn Lamer at [place]    Pb revise ulnar nerve at elbow  1979    Hx hysterectomy      Hx appendectomy      Hx colonoscopy  Hx hand surgery      Hx no surgical procedures      Hx other             Past Medical History   Diagnosis Date    depression     Multinodular goiter     Headache(784.0)      Migraines    Arthritis     Vaginal prolapse     Rheumatic fever 17     no complications    Depression     GERD (gastroesophageal reflux disease)      controlled w/med    Wears glasses     Wears dentures     Contact dermatitis and other eczema, due to unspecified cause      allergic reaction to detergent    MINOR CAD (coronary artery disease) 11/10/2012    Hyperlipidemia LDL goal < 100 08/29/2013           Patient Active Problem List   Diagnosis    Migraine Headaches    GERD not well controlled    Depression    Patellofemoral pain syndrome    Multinodular goiter    Microscopic hematuria    Neck pain    Well woman exam    Lichen sclerosus et atrophicus of the vulva    Carpal tunnel syndrome on right    Osteoarthritis, knee    Left knee pain    Chest pain at rest    MINOR CAD (coronary artery disease)    Hyperlipidemia LDL goal < 100    Otitis media of both ears    Mass of parotid gland, right              Family History:  Family History   Problem Relation Age of Onset    Congestive Heart Failure Father 69     died at 69    Coronary Artery Disease Father     Heart Attack Father     Heart Attack Sister      45    Hypertension Brother     Hypertension Sister     Hypertension Sister     Kidney Disease Sister      ESRD    Stroke Paternal Grandmother     Diabetes Mother     High Cholesterol Mother     Hypertension Mother     Coronary Artery Disease Mother 38     CABG    Thyroid Disease Sister     Bipolar Disorder Daughter     Cancer Paternal Aunt      Breast Ca    Cancer Paternal Aunt      Breast Ca    Cancer Paternal Aunt      Breast Ca    Cancer Paternal Aunt      Breast Ca    Cancer Paternal Aunt      Breast Ca    Cancer Paternal Uncle      Leukemia    Cancer Other 4     Breast Ca/Breast Ca           Social History:     History   Substance Use Topics    Smoking status: Former Smoker -- 2.00 packs/day for 40 years     Types: Cigarettes     Quit date: 10/14/2005    Smokeless tobacco: Never Used      Comment: quit 2007    Alcohol Use: No       MD Addition to HPI: Referred  by PCP for eye pain         Assessment:      ICD-10-CM    1. Dry eye syndrome, bilateral H04.123 Fluorometholone (FML) 0.1 % Ophthalmic Drops, Suspension   2. 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 Hyperopia, bilateral H52.03    4. Presbyopia H52.4        Ophthalmic Plan of Care:    Ptosis with brow lifting leading to tension HA - keep plastics appt.  Dry eye - brief course of FML, then artificial tears  New glasses Rx    Follow up:    I have asked [name] to follow up in future PRN if symptoms worsen or fail to improve         Gwendalyn Ege, MD 12/25/2014, 09:07    I have seen and examined the above patient. I discussed the above diagnoses listed in the assessment and the above ophthalmic plan of care with the patient and patient's family. All questions were answered. I reviewed and, when necessary, made  changes to the technician/resident note, documented ophthalmology exam, chief complaint, history of present illness, allergies, review of systems, past medical, past surgical, family and social history.    No orders of the defined types were placed in this encounter.       Orders Placed This Encounter    Fluorometholone (FML) 0.1 % Ophthalmic Drops, Suspension

## 2014-12-26 ENCOUNTER — Encounter (INDEPENDENT_AMBULATORY_CARE_PROVIDER_SITE_OTHER): Payer: Self-pay | Admitting: Neurology

## 2014-12-26 ENCOUNTER — Ambulatory Visit: Payer: MEDICAID | Attending: Neurology | Admitting: Neurology

## 2014-12-26 VITALS — BP 118/80 | HR 84 | Ht 65.0 in | Wt 223.1 lb

## 2014-12-26 DIAGNOSIS — G4441 Drug-induced headache, not elsewhere classified, intractable: Secondary | ICD-10-CM

## 2014-12-26 DIAGNOSIS — G5600 Carpal tunnel syndrome, unspecified upper limb: Secondary | ICD-10-CM | POA: Insufficient documentation

## 2014-12-26 DIAGNOSIS — H02403 Unspecified ptosis of bilateral eyelids: Secondary | ICD-10-CM | POA: Insufficient documentation

## 2014-12-26 DIAGNOSIS — R51 Headache: Secondary | ICD-10-CM | POA: Insufficient documentation

## 2014-12-26 DIAGNOSIS — H5713 Ocular pain, bilateral: Secondary | ICD-10-CM | POA: Insufficient documentation

## 2014-12-26 DIAGNOSIS — G4440 Drug-induced headache, not elsewhere classified, not intractable: Secondary | ICD-10-CM

## 2014-12-26 LAB — HISTORICAL CYTOPATHOLOGY-FINE NEEDLE ASPIRATE

## 2014-12-26 MED ORDER — GABAPENTIN 300 MG CAPSULE
300.00 mg | ORAL_CAPSULE | Freq: Three times a day (TID) | ORAL | Status: DC
Start: 2014-12-26 — End: 2015-05-01

## 2014-12-26 MED ORDER — METHYLPREDNISOLONE 4 MG TABLETS IN A DOSE PACK
ORAL_TABLET | ORAL | Status: DC
Start: 2014-12-26 — End: 2015-01-13

## 2014-12-26 NOTE — Progress Notes (Addendum)
[facility] Department of Neurology                                       Operated by [facility]  Return Outpatient Note      Date:  12/26/2014  Patient Name: [name]  MRN: [id number]  Age:  59 y.o.  Referring Physician:   [name], MD  [place], [place] [address]    CC: Headache      History obtained from:  Patient and Family    HPI:  59 y.o F with PMH of depression and chronic pain presents for follow up. Patient was seen in the past for occipital neurologia which was treated with Neurontin for 6 months. Patient presents today with new onset headache and bilateral ptosis. Potosis started 3-4 months ago. As per patient, it get worse in the morning. She has to put a heating pas on it to help her eyelid opens. Patient having daily headache bi temporal , bi frontal, sharp, shooting,  Lt> Rt. She takes 800 mg Motrin TID for her headache. She denies any weakness, numbness, visual changes or bowel/ bladder symptoms. Other than Neurontin, patient was never on any other preventative meds.    PHYSICAL EXAM:    BP 118/80 mmHg   Pulse 84   Ht 1.651 m (5\' 5" )   Wt 101.2 kg (223 lb 1.7 oz)   BMI 37.13 kg/m2    Appearance: No Acute Distress  Ophthalmoscopic: Disc Flat, Normal fundus  Carotid/Heart/Peripheral Vascular: No Bruits, RRR  Mental status:  Orientation: Awake, Alert, and Oriented x 3  Memory: Registation 3/3 Recall 3/3  Attention: Normal  Knowledge: Appropriate  Language: No aphasia  Speech: No dysarthria  Cranial Nerves:  2 No Visual Defect on Confrontation, Pupils round, equal, reactive to light  3,4,6 Extraocular Movements Intact, no nystagmus  5 Facial Sensation Intact  7 No facial asymmetry  8 Intact hearing  9,10 Palate symmetric, normal gag  11 Good shoulder shrug  12 Tongue Midline  Gait: Stable, no ataxia  Coordination: No ataxia with finger to nose testing, and heel to shin  Sensory: decrease sensation in  bilateral hands in median nerve distrubution of bilateral CTS   Muscle Tone: Normal              Muscle exam:  Arm Right Left Leg Right Left   Deltoid 5/5 5/5 Iliopsoas 5/5 5/5   Biceps 5/5 5/5 Quads 5/5 5/5   Triceps 5/5 5/5 Hamstrings 5/5 5/5   Wrist Extension 5/5 5/5 Ankle Dorsi Flexion 5/5 5/5   Wrist Flexion 5/5 5/5 Ankle Plantar Flexion 5/5 5/5   Interossei 5/5 5/5 Ankle Eversion 5/5 5/5   APB 5/5 5/5 Ankle Inversion 5/5 5/5       Reflexes   RJ BJ TJ KJ AJ Plantars Hoffman's   Right 2+ 2+ 2+ 2+ 2+ Downgoing Not present   Left 2+ 2+ 2+ 2+ 2+ Downgoing Not present     Personal review of             Diabetes Monitors  A1C - Glucose - Lipids Microalbumin   Results in Last 18 Months   Lab Test  08/29/13   0941  12/25/14   0921   GLUCOSEFAST  104   --    CHOLESTEROL  204*  194   HDLCHOL  42  34*   LDLCHOL  141*  122*   TRIG  103  190*    No results for input(s): MICALBRNUR, MICALBCRERAT in the last 13140 hours.     Diabetic foot exam: Non diabetic                       Outside records: NA     Assessment/Plan:     ICD-10-CM    1. Eye pain, bilateral H57.13 AMB CONSULT/REFERRAL NEUROLOGY   2. Ptosis, bilateral H02.403 AMB CONSULT/REFERRAL NEUROLOGY     Orders Placed This Encounter    MYASTHENIA GRAVIS (MG) EVALUATION, ADULT    MUSK ANTIBODY    Methylprednisolone (MEDROL, PAK,) 4 mg Oral Tablets, Dose Pack    gabapentin (NEURONTIN) 300 mg Oral Capsule       59 y.o F with chronic daily headache and right parotid mass presents for chronic daily headache and bilateral eye ptosis:    Chronic daily headache:   - most likely rebound headache due to medication overuse   - patient education done.   - will start medrol dose pack to break the headache cycle.   - Will start Neurontin 300 mg TID to be increased to 600 mg TID   - patient to stop taking motrin.     Bilateral ptosis:   - less likely myasthenia gravis.   - will get MG panel to r/o     Hx of CTS:   - followed by outside facility physician          Elmer Bales, MD 12/26/2014, 15:20    12/27/2014  I saw and examined the patient.  I reviewed the resident's note.  I agree with the findings and plan of care as documented in the resident's note.  Any exceptions/additions are noted.    Explained how headache syndromes may cause neurological symptoms and eye findings. Will see if they improve as the headache improves.    Candie Chroman, MD 12/27/2014, 16:42

## 2015-01-01 ENCOUNTER — Ambulatory Visit (HOSPITAL_BASED_OUTPATIENT_CLINIC_OR_DEPARTMENT_OTHER): Payer: MEDICAID | Admitting: Otolaryngology

## 2015-01-01 ENCOUNTER — Ambulatory Visit
Admission: RE | Admit: 2015-01-01 | Discharge: 2015-01-01 | Disposition: A | Discharge status: Home / Self Care | Payer: MEDICAID | Source: Ambulatory Visit | Attending: Otolaryngology | Admitting: Otolaryngology

## 2015-01-01 VITALS — BP 122/78 | HR 91 | Temp 98.5°F | Ht 65.87 in | Wt 221.3 lb

## 2015-01-01 DIAGNOSIS — K118 Other diseases of salivary glands: Secondary | ICD-10-CM

## 2015-01-01 DIAGNOSIS — G7000 Myasthenia gravis without (acute) exacerbation: Secondary | ICD-10-CM | POA: Insufficient documentation

## 2015-01-01 DIAGNOSIS — H02403 Unspecified ptosis of bilateral eyelids: Secondary | ICD-10-CM

## 2015-01-01 DIAGNOSIS — R220 Localized swelling, mass and lump, head: Secondary | ICD-10-CM | POA: Insufficient documentation

## 2015-01-01 NOTE — Progress Notes (Signed)
PATIENT NAME:  [name]  MRN:  [id number]  DOB:  04/25/1956  DATE OF SERVICE: 01/01/2015    HPI:  [name] is a 59 y.o. year old female who was seen last week for a right parotid mass. This was found on MRI scan for eyelid drooping. Regarding the former she has since seen ophthalmology and neurology and is being evaluated for myasthenia gravis. 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 She has not hat FN weakness. She feels there is pressure in her right ear and pain around that area. She also has eye pain that has responded to drops given to her by her eye doctor. She is not on blood thinners.       Physical Exam:  Blood pressure 122/78, pulse 91, temperature 36.9 C (98.5 F), height 1.673 m (5' 5.87"), weight 100.4 kg (221 lb 5.5 oz).  Body mass index is 35.87 kg/(m^2).  General Appearance: Pleasant, cooperative, healthy, and in no acute distress.  Eyes: Conjunctivae/corneas clear, PERRLA, EOM's intact.  Head and Face: Normocephalic, atraumatic.  Face symmetric, no obvious lesions.   Ears: Normal shape and position. TM intact, translucent, midposition, middle ear aerated.  Nose:  External pyramid midline. Septum midline. Mucosa normal. No purulence, polyps, or crusts.   Oral Cavity/Oropharynx: No mucosal lesions, masses, or pharyngeal asymmetry.  Dentition/Mouth Opening: > 4cm  Hypopharynx/Larynx: Indirect mirror laryngoscopy revealed no hypopharyngeal or laryngeal masses or lesions, normal laryngeal mobility, and voice normal.  Neck:  Right parotid tail with fullness.   Heme/Lymph:  No cervical adenopathy.  Cardiovascular:  Good perfusion of upper extremities.  No cyanosis of the hands or fingers.  Lungs: No apparent stridorous breathing. No acute distress.  Neurologic: Cranial nerves: grossly intact. FN intact  Psychiatric:  Alert and oriented x 3.      Data Reviewed:  Pathology showing pleomorphic adenoma.     Assessment:  1. Parotid mass        Plan: 58 y/o woman with right deep  lobe parotid mass noted in MRI for droopy eyelids and neurologic symptoms. We discussed r/b of surgery today including bleeding infection, FN injury, Freys syndrome, First Bite syndrome and l;ack of pain improvement. I believe this can help the patient's pressure sensation, as well as decrease the risk of malignant transformation that might exist. Patient understood. She must have her myasthenia evaluation done prior to surgery to make sure she is optimized for anesthesia. She will have labs drawn today and schedule surgery accordingly.         Delfino Lovett, MD 01/01/2015, 10:27    PCP:  [name], MD  [address]  [place] [address]   REF:  [name], [place], [place] [address]

## 2015-01-08 LAB — MYASTHENIA GRAVIS (MG) EVALUATION, ADULT
ACETYLCHOLINE BINDING ANTIBODY: 0 nmol/L
ACH RECEPTOR (MUSCLE) MODULATING AB: 9 %
STRIATIONAL (STRIATED MUSCLE) AB, S: NEGATIVE {titer}

## 2015-01-08 LAB — MUSK ANTIBODY: MUSK ANTIBODY: 0 nmol/

## 2015-01-13 ENCOUNTER — Encounter (INDEPENDENT_AMBULATORY_CARE_PROVIDER_SITE_OTHER): Payer: Self-pay | Admitting: OPHTHALMOLOGY

## 2015-01-13 ENCOUNTER — Ambulatory Visit (HOSPITAL_BASED_OUTPATIENT_CLINIC_OR_DEPARTMENT_OTHER): Payer: MEDICAID | Admitting: OPHTHALMOLOGY

## 2015-01-13 ENCOUNTER — Ambulatory Visit
Admission: RE | Admit: 2015-01-13 | Discharge: 2015-01-13 | Disposition: A | Discharge status: Home / Self Care | Payer: MEDICAID | Source: Ambulatory Visit | Attending: OPHTHALMOLOGY | Admitting: OPHTHALMOLOGY

## 2015-01-13 DIAGNOSIS — H04123 Dry eye syndrome of bilateral lacrimal glands: Secondary | ICD-10-CM

## 2015-01-13 DIAGNOSIS — K219 Gastro-esophageal reflux disease without esophagitis: Secondary | ICD-10-CM | POA: Insufficient documentation

## 2015-01-13 DIAGNOSIS — H01003 Unspecified blepharitis right eye, unspecified eyelid: Secondary | ICD-10-CM

## 2015-01-13 DIAGNOSIS — H2513 Age-related nuclear cataract, bilateral: Secondary | ICD-10-CM

## 2015-01-13 DIAGNOSIS — H02109 Unspecified ectropion of unspecified eye, unspecified eyelid: Secondary | ICD-10-CM

## 2015-01-13 DIAGNOSIS — H16219 Exposure keratoconjunctivitis, unspecified eye: Secondary | ICD-10-CM | POA: Insufficient documentation

## 2015-01-13 DIAGNOSIS — H04129 Dry eye syndrome of unspecified lacrimal gland: Secondary | ICD-10-CM

## 2015-01-13 DIAGNOSIS — K110 Atrophy of salivary gland: Secondary | ICD-10-CM | POA: Insufficient documentation

## 2015-01-13 DIAGNOSIS — H189 Unspecified disorder of cornea: Secondary | ICD-10-CM

## 2015-01-13 DIAGNOSIS — Z87891 Personal history of nicotine dependence: Secondary | ICD-10-CM | POA: Insufficient documentation

## 2015-01-13 DIAGNOSIS — H02105 Unspecified ectropion of left lower eyelid: Secondary | ICD-10-CM | POA: Insufficient documentation

## 2015-01-13 DIAGNOSIS — H02102 Unspecified ectropion of right lower eyelid: Secondary | ICD-10-CM | POA: Insufficient documentation

## 2015-01-13 DIAGNOSIS — F329 Major depressive disorder, single episode, unspecified: Secondary | ICD-10-CM | POA: Insufficient documentation

## 2015-01-13 DIAGNOSIS — H02403 Unspecified ptosis of bilateral eyelids: Secondary | ICD-10-CM

## 2015-01-13 DIAGNOSIS — G43909 Migraine, unspecified, not intractable, without status migrainosus: Secondary | ICD-10-CM | POA: Insufficient documentation

## 2015-01-13 DIAGNOSIS — H01006 Unspecified blepharitis left eye, unspecified eyelid: Secondary | ICD-10-CM

## 2015-01-13 DIAGNOSIS — H16213 Exposure keratoconjunctivitis, bilateral: Secondary | ICD-10-CM

## 2015-01-13 NOTE — Procedures (Signed)
BLL ultraplug placed no complications  [name], MD  01/13/2015, 10:20

## 2015-01-13 NOTE — Patient Instructions (Addendum)
Over-the-counter, preservative-free artificial tears, apply to eyes as needed for dryness.

## 2015-01-13 NOTE — Progress Notes (Addendum)
[place]  Operated by [facility]  [place] [address]  Dept: [facility] Note    01/13/2015  [name],    Date of Birth:  07/31/1956    No referring provider defined for this encounter.    SURGERY  Procedure: BLL LTS w/ear cartilage elevation   Diagnosis:     ICD-10-CM    1. 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 Nuclear sclerotic cataract of both eyes H25.13    3. 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 female here with complaint of:  Chief Complaint     Ptosis        HPI     Ptosis   In right upper lid and left upper lid.  Severity is moderate.  Disease is new.  Duration of 4 months.  It is worse throughout the day and when tired.  Treatments tried include eye drops.  Response to treatment was no improvement.           Comments   Pt notes that for about the past four to five months her eyelids have been droopy.  She notes that she gets headaches due to the tension from trying to lift her eyelids.  She notes that in the mornings her left eye will not want to open,  She has to manually open it.  She states that by the end of the day they are extremely droopy.  No tearing, occasional burning of her eyes.         Last edited by Zettie Cooley, COA on 01/13/2015  8:43 AM. (History)        History of Sun Exposure:no If yes:  Ptosis evaluation: Droopy eyelids cause problems when trying to drive  Vision is improved by holding up my eyelids  Problems include: burning  Uses eye drops or ointment: yes      MD Addition to HPI:   Patient here for ptosis eval. Patient reports saw dr. Laurance Flatten on 3/2 and started BID FML OU for dry eye. Patient reports discomfort improved. She feels that her eyelids have been drooping for about 5 months and she feels  she gets tired pulling up the lids and strained. FBS worse on left, esp bad when driving. No double vision. Denies any vision changes.   No Hx of eye surgeries.   Scheduled for parotid mass removal following biopsy (came back benign) surgery April 14th w/Dr. Posey Pronto. Pt was informed of staying off blood-thinners 2 weeks pre-op.    Impression and Plan:  1. 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 - due to parotid mass ? affecting 7th nerve. Pt has lag and LSS on both sides    2. 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 Laurance Flatten  - temporary punctal plugs placed today both sides  Start pres free tears at least TID, more PRN.     Rec BLL LTS with ear cartilage elevation to place BLL over the inf limbus. Spoke with Dr. Posey Pronto, she does not anticipate  reseciton of the right facial nerve. As such, will not perform tars at time of surgery. Once K healed and pt healed from parotid surgeyr, consider BUL ptosis/bleph.   R&B including pain, bleeding, infection, blindness, dryness, need for further surgery, death. Pt understands and wishes to proceed.   Pt understands risk of stroke, heart attack, and death stopping anticoagulants for surgery.  [name], MD  01/13/2015, 10:17    3. Parotid mass right side  - Subtle facial weakness right side  - sensation full V1,2,3   - scheduled for removal w/Dr. Posey Pronto on 01/06/15      I saw and examined the patient.  I reviewed the resident's note.  I agree with the findings and plan of care as documented in the resident's note.  Any exceptions/additions are edited/noted.    [name], MD 01/13/2015, 10:16    I have reviewed and confirmed the ROS, PFSH, and exam elements performed and documented by the technician. The scribed portion of the progress note was scribed on my behalf and at my direction. I have reviewed and attest to the accuracy  of the note.   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 (History)            Eye Examination:     Neuro/Psych     Oriented x3:  Yes    Mood/Affect:  Normal        Visual Acuity     Visual Acuity (Snellen - Linear)      Right Left   Dist sc 20/40 20/40   Dist ph sc 20/30 20/30             Edited by: Zettie Cooley, COA             Not recorded         Not recorded         Not recorded        Confrontational Visual Fields     Visual Fields (Counting fingers)      Right Left   Result Full Full             Edited by: Zettie Cooley, COA            Pupils      Pupils APD   Right PERRL None   Left PERRL None           Extraocular Movement     Extraocular Movement      Right Left   Result Full Full             Edited by: Imagene Riches D, COA               OD    OS                       (full)                              ortho  Sensation   V1: Intact bilaterally   V2: Intact bilaterally   V3: Intact bilaterally    Eyelids:  Chin-up position  Brow sits just at the rim  1cm distraction both sides, at least 2 blinks to return to globe  Ectropion R>L  1+ orbicularis weakness right side  Blepharitis    Lid edema UY:QIHK OS: None Conj chem OD: None VQ:QVZDG   Lid inject OD: None OS: None Conj inject OD: 1 OS: 1       3   0   PF   MRD       3   0       14   high   LF   LC       13   high       0   0.5   USS   LSS       0   0.5       1   LAG       1       SLE  OD OS     Tear Assessment Scant lake Scant lake    Cornea Tr PEE 1+ PEE     AC D&Q D&Q    Iris intact intact    Lens 1+ NSC 1+ NSC       IOP straight  Tonometry     Tonometry (defers iop, 8:51 AM)     Pt just had IOP CHECK on 12/25/14   16 OD 14 OS          Edited by: Zettie Cooley, COA                Upgaze     5 down        Fundus Dilated no    Last Dilated 12/25/14     OD OS   Disc     Macula     Vessels     Periphery            Past Medical / Surgical / Family / Social History:     Past Medical History   Diagnosis Date    depression     Multinodular goiter     Headache(784.0)      Migraines    Arthritis     Vaginal prolapse     Rheumatic fever 16     no complications    Depression     GERD (gastroesophageal reflux disease)      controlled w/med    Wears glasses     Wears dentures     Contact dermatitis and other eczema, due to unspecified cause      allergic reaction to detergent    MINOR CAD (coronary artery disease) 11/10/2012    Hyperlipidemia LDL goal < 100 08/29/2013           Past Surgical History   Procedure Laterality Date    Pb upper gi endoscopy,biopsy  12/19/07     patulous GE junction zone, erythema, nonerosive GERD    Hx ankle fracture tx  2007     left distal fibula, casted    Hx wrist fracture tx  2007     Radnor injury    Hx partial hysterectomy      Colonoscopy  01/06/09     COLONOSCOPY performed by Loletta Parish F at [place]    Hx cystocele repair  09/24/09    Hx gall bladder surgery/chole  Hx adenoidectomy      Hx tonsillectomy      Gastroscopy  03/05/2010     GASTROSCOPY performed by Jocelyn Lamer at [facility] with biopsy  03/05/2010     GASTROSCOPY WITH BIOPSY performed by Jocelyn Lamer at [facility]    Pb revise ulnar nerve at elbow  1979    Hx hysterectomy      Hx appendectomy      Hx colonoscopy      Hx hand surgery      Hx other             Family History   Problem Relation Age of Onset    Congestive Heart Failure Father 53     died at 64    Coronary Artery Disease Father     Heart Attack Father     Heart Attack Sister      56    Hypertension Brother     Hypertension Sister      Hypertension Sister     Kidney Disease Sister      ESRD    Stroke Paternal Grandmother     Diabetes Mother     High Cholesterol Mother     Hypertension Mother     Coronary Artery Disease Mother 17     CABG    Thyroid Disease Sister     Bipolar Disorder Daughter     Cancer Paternal Aunt      Breast Ca    Cancer Paternal Aunt      Breast Ca    Cancer Paternal Aunt      Breast Ca    Cancer Paternal Aunt      Breast Ca    Cancer Paternal Aunt      Breast Ca    Cancer Paternal Uncle      Leukemia    Cancer Other 75     Breast Ca/Breast Ca           History   Substance Use Topics    Smoking status: Former Smoker -- 2.00 packs/day for 40 years     Types: Cigarettes     Quit date: 10/14/2005    Smokeless tobacco: Never Used      Comment: quit 2007    Alcohol Use: No       I have seen and examined the above patient. I discussed the above diagnoses listed in the assessment and the above ophthalmic plan of care with the patient and patient's family. All questions were answered. I reviewed and, when necessary, made changes to the technician/resident note, documented ophthalmology exam, chief complaint, history of present illness, allergies, review of systems, past medical, past surgical, family and social history.      [name], M.D.  01/13/2015, 10:07    I scribed a portion of the encounter including Chief Complaint, HPI, Impression, Plan, and exam elements excluding visual acuity, lensometry, pupil assessment, confrontational visual fields, tonometry results, extraocular motility assessment, refractometry results, and assessment of mood and orientation. I scribed this note at the request of the physician who personally performed the services documented.  Renne Crigler, SCRIBE  01/13/2015, 09:33

## 2015-01-16 ENCOUNTER — Encounter (INDEPENDENT_AMBULATORY_CARE_PROVIDER_SITE_OTHER): Payer: MEDICAID | Admitting: Neurology

## 2015-01-23 ENCOUNTER — Encounter (INDEPENDENT_AMBULATORY_CARE_PROVIDER_SITE_OTHER): Payer: Self-pay

## 2015-01-23 ENCOUNTER — Ambulatory Visit (INDEPENDENT_AMBULATORY_CARE_PROVIDER_SITE_OTHER): Admission: RE | Admit: 2015-01-23 | Payer: MEDICAID | Source: Ambulatory Visit

## 2015-01-23 ENCOUNTER — Ambulatory Visit (HOSPITAL_BASED_OUTPATIENT_CLINIC_OR_DEPARTMENT_OTHER): Payer: MEDICAID | Admitting: Orthopaedic Surgery

## 2015-01-23 ENCOUNTER — Ambulatory Visit
Admission: RE | Admit: 2015-01-23 | Discharge: 2015-01-23 | Disposition: A | Discharge status: Home / Self Care | Payer: MEDICAID | Source: Ambulatory Visit | Attending: Orthopaedic Surgery | Admitting: Orthopaedic Surgery

## 2015-01-23 ENCOUNTER — Ambulatory Visit (HOSPITAL_BASED_OUTPATIENT_CLINIC_OR_DEPARTMENT_OTHER)
Admission: RE | Admit: 2015-01-23 | Discharge: 2015-01-23 | Disposition: A | Discharge status: Home / Self Care | Payer: MEDICAID | Source: Ambulatory Visit | Attending: Otolaryngology | Admitting: Otolaryngology

## 2015-01-23 ENCOUNTER — Encounter (HOSPITAL_BASED_OUTPATIENT_CLINIC_OR_DEPARTMENT_OTHER): Payer: Self-pay | Admitting: Orthopaedic Surgery

## 2015-01-23 VITALS — Temp 98.2°F | Ht 65.0 in | Wt 226.2 lb

## 2015-01-23 DIAGNOSIS — R220 Localized swelling, mass and lump, head: Secondary | ICD-10-CM

## 2015-01-23 DIAGNOSIS — M549 Dorsalgia, unspecified: Secondary | ICD-10-CM

## 2015-01-23 DIAGNOSIS — M2352 Chronic instability of knee, left knee: Secondary | ICD-10-CM | POA: Insufficient documentation

## 2015-01-23 DIAGNOSIS — J949 Pleural condition, unspecified: Secondary | ICD-10-CM

## 2015-01-23 DIAGNOSIS — Z01818 Encounter for other preprocedural examination: Secondary | ICD-10-CM

## 2015-01-23 DIAGNOSIS — K118 Other diseases of salivary glands: Secondary | ICD-10-CM

## 2015-01-23 DIAGNOSIS — M7052 Other bursitis of knee, left knee: Secondary | ICD-10-CM | POA: Insufficient documentation

## 2015-01-23 DIAGNOSIS — M7051 Other bursitis of knee, right knee: Secondary | ICD-10-CM | POA: Insufficient documentation

## 2015-01-23 LAB — BASIC METABOLIC PANEL
ANION GAP: 10 mmol/L (ref 4–13)
BUN: 15 mg/dL (ref 8–25)
CALCIUM: 9.8 mg/dL (ref 8.5–10.4)
CARBON DIOXIDE: 23 mmol/L (ref 22–32)
CHLORIDE: 104 mmol/L (ref 96–111)
CREATININE: 1.15 mg/dL — ABNORMAL HIGH (ref 0.49–1.10)
ESTIMATED GLOMERULAR FILTRATION RATE: 52 mL/min/{1.73_m2} — ABNORMAL LOW (ref >59)
GLUCOSE,NONFAST: 87 mg/dL (ref 65–139)
POTASSIUM: 4.3 mmol/L (ref 3.5–5.1)
SODIUM: 137 mmol/L (ref 136–145)

## 2015-01-23 LAB — ECG 12-LEAD (PERFORMED IN PREADMISSION UNIT ONLY)
Atrial Rate: 66 {beats}/min
Calculated R Axis: -8 degrees
PR Interval: 158 ms
QRS Duration: 84 ms
QT Interval: 424 ms
QTC Calculation: 444 ms
Ventricular rate: 66 {beats}/min

## 2015-01-23 LAB — CBC
HCT: 42.2 % (ref 33.5–45.2)
HGB: 14 g/dL (ref 11.2–15.2)
MCH: 28.4 pg (ref 27.4–33.0)
MCHC: 33.2 g/dL (ref 32.5–35.8)
MCV: 85.8 fL (ref 78–100)
MPV: 7.4 fL — ABNORMAL LOW (ref 7.5–11.5)
PLATELET COUNT: 392 10*3/uL (ref 140–450)
RBC: 4.92 MIL/uL (ref 3.63–4.92)
RDW: 15.2 % — ABNORMAL HIGH (ref 12.0–15.0)
WBC: 7 10*3/uL (ref 3.5–11.0)

## 2015-01-23 LAB — TYPE AND SCREEN
ABO/RH(D): B POS
ANTIBODY SCREEN: NEGATIVE

## 2015-01-23 LAB — PERFORM POC WHOLE BLOOD GLUCOSE: GLUCOSE, POINT OF CARE: 93 mg/dL (ref 70–105)

## 2015-01-23 NOTE — Anesthesia Preprocedure Evaluation (Signed)
Physical Exam:     Airway       Mallampati: III    TM distance: >3 FB    Neck ROM: full  Mouth Opening: good.      No endotracheal tube present  No Tracheostomy present    Dental           (+) upper dentures             Pulmonary    Breath sounds clear to auscultation  (-) no rhonchi, no decreased breath sounds, no wheezes, no rales and no stridor     Cardiovascular    Rhythm: regular  Rate: Normal  (-) no friction rub, carotid bruit is not present, no peripheral edema and no murmur     Other findings            Anesthesia Plan:  Planned anesthesia type: general  ASA 3                                     EKG Ordered: 01/23/2015  CXR Ordered:  01/23/2015  Other Studies: labs in PAU  Consults: None    Patient instructed to take the following medications day of surgery, atenolol, Wellbutrin, Prozac, Neurontin, protonix.  Instructed to hold ibuprofen for 7 days prior to surgery.  Patient provided anesthesia consent in PAT. Instructed to review consent prior to OR date. Educated that consent will be signed morning of surgery with anesthesiologist.    In

## 2015-01-23 NOTE — Progress Notes (Signed)
[place], [place] [address]                                PATIENT NAME: [name], [name] [facility] QGBEEF:[id number]  DATE OF SERVICE:01/23/2015  DATE OF BIRTH: 1956-10-01    PROGRESS NOTE    SUBJECTIVE:  Ms. Turri returns today for followup of her bilateral knee bursitis.  She has been unable to take physical therapy as she has been having some issues of the parotid gland, and there is even some concern for possible cancerous lesion.  Her knees have certainly felt better from her corticosteroid injections.  She is wearing a brace on her left side that she is very happy with.    OBJECTIVE:  On exam today, the patient is in no acute distress.  She is alert and oriented x3.  Evaluation of her bilateral lower extremities reveals there is no tenderness about her pes bursa at this point in time.  The left knee continues to feel stable on examination.    ASSESSMENT:  1.  Bilateral knee pes bursitis.  2.  Left knee reported instability.    PLAN:  The patient will continue the brace as needed.  I asked her to start physical therapy if possible, which she was agreeable to.  I will see her back on an as-needed basis at this point in time.      Malon Kindle, MD  Assistant Professor  [facility] Department of Orthopaedics    OI/TGP/[id number]; D: 01/23/2015 13:03:38; T: 01/23/2015 20:10:50

## 2015-01-24 ENCOUNTER — Encounter (HOSPITAL_COMMUNITY): Payer: Self-pay

## 2015-01-24 NOTE — Ancillary Notes (Addendum)
[place] Record for: [name], [name]  Created: 01/23/2015 9:13:29 AM  MRN: [id number]  DOB: [birth year]  SSN: [id number]  Sex: Female  Height: 5 Feet 5 Inches - Weight: [place] Name:   Address:   [address]  [place]: [place], [place] [address]  E-Mail:   Day Phone: [phone] Night Phone:  Other Phone:   Phone comments:   Call Back time:   Authorized contact: Trina Dunson-friend  Intake Date: 01/23/2015 9:13:29 AM  PCP: Rodney Booze MD, Santiago Glad   RefMD: Percell Miller MD, [place]   Primary Insurance: Medicaid - [place]  Secondary Insurance:   Insurance Comments:      -------Referral Assignment------  Where was the original referral directed?: [facility]  Was a specific reviewer requested?: Unassigned Referral  How was the reviewer selected?: Rotation  Who requested the specific reviewer?:   How did you hear of our spine program?:   Is this a second opinion?:      -------Field seismologist----------  [employer]:   City of Birth:      ---------- 1st Review ----------     Review Date:   Review Completed by:   Impression:   Disposition:   Appt w/ Colleague:   Colleague Name:   Appt How Soon:   Pre Treatment Type:   Pre Treatment Type Details:   Pre Treatment Other Test:   Appt Type:   Instructions:      ---------- Symptoms ----------     Chief complaint: lbpleft hip painleft buttock painbilateral leg painno groin pain  Diagnosis from Other MD:      Symptoms: Pain,Numbness and/or tingling  Other Symptom Description:   Pain Location: Low back,Hip,Leg,Buttocks  Other Pain Location Description:   Where is your pain the worst?: Low back  Pain Type: Sharp/Stabbing,Constant,Dull/Aching,Radiating  Pain Rating: 8  Does the pain radiate to other parts of your body? Yes   Radiate Where: From low back to left leg,From low back to right leg  Other Description:   Does it radiate to the fingers?   Does it radiate below the elbow?   Which specific part of your arm?   Which fingers?   Which part of your arm does pain go  to?   How does it radiate to the arm?   Does it radiate to the toes?   Does it radiate below the knee? Yes  Which specific part of your leg? Toes  Which toes? Left Great,Right Great  Which part of your leg does pain go to?   How does it radiate to the leg? Back  Additional pain information      Location of Numbness/tingling: Foot,Leg  Other Description:  Does numbness/tingling radiate to other parts of your body?:Yes  Where does the numbess/tingling radiate?:From low back to left leg,From low back to right leg  Other Description:  Does the numbness/tingling radiate below your elbow?:  Which specific part of your arm?:  Which fingers:  Which pat of your arm does the numbness/tingling go to?:  How does the numbness/tingling radiate to your arm?:  Does the numbness/tingling radiate below your knee?:Yes  Which specific part of your leg?:Left Foot,Right Foot  Which toes:  Which part of your leg does the numbness/tingling go to?:  How does the numbness/tingling radiate to your leg?:Back  Additional Numbness/tingling information:  Other Description:      Location of Weakness:   Other Description:   Additional Weakness Information:      The symptoms have  been present for: 1 - 3 months  The symptoms began: 1/16  Was there a specific event that caused your symptoms?: With no known cause  Additional Narrative Description:   Are you able to perform your daily activities with these symptoms?: Yes  Since what date have you been unable to perform your daily routine?:   The symptoms improve when you: Walk,Lie down,Rest,Activity  Other activities that improve your symptoms:   The symptoms worsen when you: Stand,Sit,Activity  Other activities that worsen your symptoms:      ---------- Work History ----------     Are you able to work?: Does not apply  Reasons for not working: Disabled  Other Reason for not working:   Do you have Work Restrictions:   If applicable, maximum lifting restriction:   How long have you been unable to work:      Have you ever filed a W/C claim related to a neck or back injury?: no  Occupation:   Other Occupation Information:      ---------- Bowel/Bladder/Incontinence Issues ----------     Since the onset of symptoms, have you experienced any new problems urinating or having bowel movements?: No  Description:   Other Description:   How long have you had these bowel/bladder problems?:      ---------- Allergies ----------     Do you have any medication allergies? Yes  Allergies: Codeine,blue dye,mobic,cymbalta,nickel,naproxen  Other Details:   Allergic to Latex?: No  Allergic to intravenous contrast dyes?: No  Allergic to steroids?: No     ---------- Treatment/Testing ----------      Taking prescription medication for this problem?: No  Are you using any now?:   Have you received a Medrol dose pack for this problem?: No  When did you last take the dosepack?:   Were your symptoms improved?:   Would you describe your relief as:   How long did you experience that amount of relief?:   Other Medrol dosepack information:      --------------- PT ---------------     PT: Yes  When Received: 1980's  Where Received: [facility]  Other where received information:   Visits: 3 times a week for 6 weeks  Types: Exercises,Modalities - E-stim/Ultrasound/Ice/Heat  Other Types:   Improved: Yes     If improved, describe level of relief: Moderate  If improved, how long did you experience relief: Long term  Other Physical Therapy Treatment Information:      ---------- Chiropractic Services ----------     Chiro: No  When:   Who:   Visits:   Types:   Other Types:   Were Symptoms Improved:   If improved, describe level of relief:   If improved, how long did you experience relief:   Other Chiropractic Treatment Information:      --------------- ESI ---------------     ESI: Yes  When: 1980's  Who: [place]   Visits: twice a month for quite a few years  Types: SI  Improved: Yes  If improved, describe level of relief: Large  If improved, how long did you  experience relief: Long term  Other Injection Treatment Information:      ---------- Diagnostic Tests ----------     Plain spine X-rays On:02/04/201612/18/2014MR  [id number] Where: [facility]  Area(s) Scanned: Cervical,Thoracic,Lumbar  X-ray Joint center hip series left, X-ray knee bilateral joint center On:11/28/2014 Where: [facility]   Do you have any of the following in case an MRI is ordered?:   Other MRI factors:      ----------  Past Medical History ----------     What other doctors/providers have treated you for these spine issues? Percell Miller MD, Christia [facility] Specialty: Orthopaedics Date: 3/16 Rodney Booze MD, Santiago Glad Specialty: Primary Care Date: 2016   Ever diagnosed with Spine deformity?:   Prior neck or back surgery (1)?: No  When:   Who:   Area of neck/spine operated on:   Level:   Were symptoms improved?:   If improved, describe level of relief:  If improved, how long did you experience relief?:   Prior neck or back surgery (2)?:   When:   Who:   Area of neck/spine operated on:   Level:   Were symptoms improved?:   If improved, describe level of relief:  If improved, how long did you experience relief?:   Prior neck or back surgery (3)?:   When:   Who:   Area of neck/spine operated on:   Level:   Were symptoms improved?:   If improved, describe level of relief:  If improved, how long did you experience relief?:   Do you have any of the following assistive devices? Brace  How long have you required these assistive devices? knee braces everyday use  Currently being treated for any other medical condition?: Yes  Conditions: Depression,GERDS,Hypercholesterolemia,Migraines  Other Conditions:   Type of neurologic disorder:   Cancer Type:   Other Treatment/Medication: atenolol,Atenolol,Neurontin,protonics,prozac,Prozac,wellbutrin,Wellbutrin,eye drops  What blood thinners are you currently taking?: None  Which physicians are treating you for your other medical conditions?: Rodney Booze MD,Karen,Murphy MD,Timothy,Dr.  [facility]  Do you smoke?: No  Are you pre-menopausal or post-menopausal?:   If recommended, are you willing to consider surgery?:   What is your goal in seeking treatment?:   Other pertinent information/ general comments/ goals for treatment: no hx of ca. dp     ---------- Care Coordinator Information ----------     Care Coordinator:   Activity Log: 01/24/2015 2:25:49 PM [Lori Mueller]: Ortho requested nonoperative evaluation; RS will call to schedule. LMueller RN CRRN     Letter/Test Coordinator: Letters  Letter/Test Coordinator Log: 01/27/2015 1:25:33 PM Maureen Chatters Shaw1]: Appointment scheduled to see Dr. Donnal Debar on 03/25/2015 at 9:00. Confirmed with patient. GShaw     Intake Specialist Comments:      Clinic Staff Comments:      Last Edited byJacquiline Doe on 01/27/2015 1:30:41 PM  Last Review by:  on

## 2015-01-26 ENCOUNTER — Other Ambulatory Visit (HOSPITAL_COMMUNITY): Payer: Self-pay | Admitting: Psychiatry

## 2015-01-27 ENCOUNTER — Ambulatory Visit: Payer: MEDICAID | Attending: Family Medicine | Admitting: Family Medicine

## 2015-01-27 ENCOUNTER — Encounter (HOSPITAL_BASED_OUTPATIENT_CLINIC_OR_DEPARTMENT_OTHER): Payer: Self-pay | Admitting: Family Medicine

## 2015-01-27 VITALS — BP 125/69 | HR 68 | Temp 97.9°F | Ht 65.71 in | Wt 227.1 lb

## 2015-01-27 DIAGNOSIS — F329 Major depressive disorder, single episode, unspecified: Secondary | ICD-10-CM | POA: Insufficient documentation

## 2015-01-27 DIAGNOSIS — Z87891 Personal history of nicotine dependence: Secondary | ICD-10-CM | POA: Insufficient documentation

## 2015-01-27 DIAGNOSIS — H02403 Unspecified ptosis of bilateral eyelids: Secondary | ICD-10-CM

## 2015-01-27 DIAGNOSIS — I2510 Atherosclerotic heart disease of native coronary artery without angina pectoris: Secondary | ICD-10-CM | POA: Insufficient documentation

## 2015-01-27 DIAGNOSIS — Z1159 Encounter for screening for other viral diseases: Secondary | ICD-10-CM

## 2015-01-27 DIAGNOSIS — K219 Gastro-esophageal reflux disease without esophagitis: Secondary | ICD-10-CM | POA: Insufficient documentation

## 2015-01-27 DIAGNOSIS — Z01818 Encounter for other preprocedural examination: Secondary | ICD-10-CM

## 2015-01-27 DIAGNOSIS — D110 Benign neoplasm of parotid gland: Secondary | ICD-10-CM

## 2015-01-27 NOTE — Progress Notes (Signed)
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 Patient has no major  risk factors for major cardiac complications by the Revised Goldman Cardiac Risk Index. This puts him/her at approximately 0.4% risk of a major cardiac complication during the planned procedure. Patient has been informed of this risk, is willing to proceed, and thus is medically cleared for the planned procedure.     ICD-10-CM    1. Pre-op evaluation Z01.818    2. Need for hepatitis C screening test Z11.59        Copy of this note will be forwarded to her referring physician.    SUBJECTIVE:  [name] is a 59 y.o. 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 surgery planned on 01/27/14 eyelids, and 02/05/14 parotid.  She is referred by Dr Posey Pronto ENT and Dr Kennith [facility] .    Past Medical History   Diagnosis Date   . Multinodular goiter    . Headache(784.0)      Migraines   . Arthritis    . Vaginal prolapse 2012     surgery improved significantly   . Rheumatic fever 16     no complications   . Depression    . GERD (gastroesophageal reflux disease)      controlled w/med   . Wears glasses    . Wears dentures    . Hyperlipidemia LDL goal < 100 08/29/2013   . Heart murmur 1979     benign   . MINOR CAD (coronary artery disease) 11/10/2012     no treatment other than cholesterol medications; follows regularly with Dr. Rodney Booze         Past Surgical History   Procedure Laterality Date   . Pb upper gi endoscopy,biopsy  12/19/07     patulous GE junction zone, erythema, nonerosive GERD   . Hx ankle fracture tx  2007     left distal fibula, casted   . Hx wrist fracture tx  2007     [place] injury   . Hx total vaginal hysterectomy  1979   . Colonoscopy  01/06/09     COLONOSCOPY performed by Loletta Parish F at [facility]   . Hx cystocele repair  09/24/09   . Hx gall bladder  surgery/chole     . Hx adenoidectomy     . Hx tonsillectomy     . Gastroscopy  03/05/2010     GASTROSCOPY performed by Jocelyn Lamer at [facility]   . Gastroscopy with biopsy  03/05/2010     GASTROSCOPY WITH BIOPSY performed by Jocelyn Lamer at [facility]   . Pb revise ulnar nerve at elbow  1979   . Hx appendectomy     . Hx colonoscopy     . Hx hand surgery  2012     for Carpal tunnel    . Hx oophorectomy       left ovary removed           Outpatient Prescriptions Prior to Visit:  atenolol (TENORMIN) 25 mg Oral Tablet Take 1 Tab (25 mg total) by mouth Once a day   buPROPion (WELLBUTRIN SR) 200 mg Oral Tablet Sustained Release TAKE 1 TABLET BY MOUTH ONCE DAILY   Clobetasol (TEMOVATE) 0.05 % Ointment Apply topically Twice daily To rash on hands and feet   Fluorometholone (FML) 0.1 % Ophthalmic Drops,  Suspension Apply 1 Drop to eye Twice daily   FLUoxetine (PROZAC) 20 mg Oral Tablet TAKE 1 TABLET BY MOUTH ONCE A DAY (TABLET NECESSARY, ALLERGY TO DYE IN CAPSULE)   fluticasone (FLONASE) 50 mcg/actuation Nasal Spray, Suspension 2 Sprays by Each Nostril route Once a day.   gabapentin (NEURONTIN) 300 mg Oral Capsule Take 1 Cap (300 mg total) by mouth Three times a day For 7 days then take 2 Cap ( 600 mg) TID   Halobetasol Propionate (ULTRAVATE) 0.05 % Cream Apply  topically. Three times a week   Ibuprofen (MOTRIN) 400 mg Oral Tablet Take 1 Tab (400 mg total) by mouth Three times a day as needed for Pain   Metronidazole (METROCREAM) 0.75 % Cream Apply topically Twice daily   mineral oil-hydrophil petrolat (AQUAPHOR) Ointment by Apply Topically route Four times a day as needed   nystatin (NYSTOP) 100,000 unit/gram Apply externally Powder Apply once or twice daily to PREVENT rash under breasts   pantoprazole (PROTONIX) 20 mg Oral Tablet, Delayed Release (E.C.) Take 1 Tab (20 mg total) by mouth Every morning before breakfast   riboflavin, vitamin B2, (VITAMIN B-2) 100 mg Oral Tablet Take 4 Tabs (400 mg total) by mouth Once a day      triamcinolone acetonide (ARISTOCORT A) 0.1 % Ointment by Apply Topically route Twice daily     No facility-administered medications prior to visit.   OB History   Gravida Para Term Preterm AB SAB TAB Ectopic Multiple Living   4 3   1 1    3       # Outcome Date GA Lbr Len/2nd Weight Sex Delivery Anes PTL Lv   4 SAB            3 Para      Vag-Spont      2 Para      Vag-Spont      1 Para      Vag-Spont           Family History   Problem Relation Age of Onset   . Congestive Heart Failure Father 52     died at 51   . Coronary Artery Disease Father    . Heart Attack Father    . Heart Attack Sister      39   . Hypertension Brother    . Hypertension Sister    . Hypertension Sister    . Kidney Disease Sister      ESRD   . Stroke Paternal Grandmother    . Diabetes Mother    . High Cholesterol Mother    . Hypertension Mother    . Coronary Artery Disease Mother 55     CABG   . Thyroid Disease Sister    . Bipolar Disorder Daughter    . Cancer Paternal Aunt      Breast Ca   . Cancer Paternal Aunt      Breast Ca   . Cancer Paternal Aunt      Breast Ca   . Cancer Paternal Aunt      Breast Ca   . Cancer Paternal Aunt      Breast Ca   . Leukemia Paternal Uncle    . Cancer Other 56     Breast Ca/Breast Ca   . Leukemia Paternal Uncle          History     Social History   . Marital Status: Divorced     Spouse Name: N/A   .  Number of Children: N/A   . Years of Education: N/A     Occupational History   . 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 Smoking status: Former Smoker -- 2.00 packs/day for 40 years     Types: Cigarettes     Quit date: 01/22/2005   . Smokeless tobacco: Never Used   . Alcohol Use: No   . Drug Use: No   . Sexual Activity: Not on file     Other Topics Concern   . Uses Cane No   . Uses Walker No   . Uses Wheelchair No   . Right Hand Dominant Yes   . Left Hand Dominant No   . Ambidextrous No   . Ability To Walk 1 Flight Of Steps Without Sob/Cp Yes   . Routine Exercise Yes      walk   . Ability To Walk 2 Flight Of Steps Without Sob/Cp Yes   . Ability To Do Own Adl's Yes   . Shift Work No   . Unusual Sleep-Wake Schedule No     Social History Narrative      ROS:   Constitutional: negative for fevers, chills and weight loss  Eyes: negative for visual disturbance  Ears, nose, mouth, throat, and face: negative for hearing loss, earaches and hoarseness  Respiratory: negative for cough or dyspnea on exertion  Cardiovascular: negative for chest pressure/discomfort, palpitations, near-syncope, syncope  Gastrointestinal: negative for dysphagia, nausea, vomiting, diarrhea, constipation, abdominal pain and blood in bowel movements  Genitourinary:negative for frequency, dysuria and hematuria.  No vaginal bleeding  Integument/breast: negative for rash and no sores  Hematologic/lymphatic: negative for lymphadenopathy  Musculoskeletal chronic knee pain, some chronic low back pain  Neurological: negative for seizures and stroke  or TIA symptoms  Behvioral/Psych: depression, feels she is doing reasonably well given the current stresses  Endocrine: negative for diabetic symptoms including polyuria, polydipsia and weight loss  Allergic/Immunologic: negative      OBJECTIVE:   Vitals: BP 125/69 mmHg  Pulse 68  Temp(Src) 36.6 C (97.9 F)  Ht 1.669 m (5' 5.71")  Wt 103 kg (227 lb 1.2 oz)  BMI 36.98 kg/m2 Body mass index is 36.98 kg/(m^2).  Appearance: in no apparent distress  Exam: Eyes: conjunctivae/corneas clear. PERRL, EOM's intact. Fundi benign, no ptosis  Ears: TMs look okay  Has a mass on her right face under the ear  Throat: Lips, mucosa, and tongue normal. Teeth and gums normal and normal findings: oropharynx pink & moist without lesions or evidence of thrush  Neck: supple, symmetrical, trachea midline, no adenopathy and thyroid: not enlarged, symmetric, no tenderness/mass/nodules  Lungs: clear to auscultation bilaterally, normal percussion bilaterally  Heart: regular rate and rhythm, S1, S2  normal, no murmur, click, rub or gallop  Abdomen: soft, non-tender. Bowel sounds normal. No masses,  no organomegaly  Extremities: extremities normal, atraumatic, no cyanosis or edema  Skin: Skin color, texture, turgor normal. No rashes or lesions  Neurologic: Grossly normal    No results found for any visits on 01/27/15.  [name], MD  [place]  [place] [address]  Dept: [phone]  Dept Fax: [phone]

## 2015-01-28 ENCOUNTER — Inpatient Hospital Stay
Admission: RE | Admit: 2015-01-28 | Discharge: 2015-01-28 | Disposition: A | Discharge status: Home / Self Care | Payer: MEDICAID | Source: Ambulatory Visit | Attending: OPHTHALMOLOGY | Admitting: OPHTHALMOLOGY

## 2015-01-28 ENCOUNTER — Encounter (HOSPITAL_COMMUNITY)
Admission: RE | Disposition: A | Discharge status: Home / Self Care | Payer: Self-pay | Source: Ambulatory Visit | Attending: OPHTHALMOLOGY

## 2015-01-28 ENCOUNTER — Encounter (HOSPITAL_COMMUNITY): Payer: Self-pay

## 2015-01-28 ENCOUNTER — Ambulatory Visit (HOSPITAL_COMMUNITY): Payer: MEDICAID | Admitting: Family

## 2015-01-28 ENCOUNTER — Ambulatory Visit (HOSPITAL_BASED_OUTPATIENT_CLINIC_OR_DEPARTMENT_OTHER): Payer: MEDICAID | Admitting: OPHTHALMOLOGY

## 2015-01-28 ENCOUNTER — Ambulatory Visit (HOSPITAL_BASED_OUTPATIENT_CLINIC_OR_DEPARTMENT_OTHER): Payer: MEDICAID | Admitting: Anesthesiology

## 2015-01-28 DIAGNOSIS — H02105 Unspecified ectropion of left lower eyelid: Secondary | ICD-10-CM

## 2015-01-28 DIAGNOSIS — H02102 Unspecified ectropion of right lower eyelid: Secondary | ICD-10-CM

## 2015-01-28 DIAGNOSIS — H02532 Eyelid retraction right lower eyelid: Secondary | ICD-10-CM

## 2015-01-28 DIAGNOSIS — Z87891 Personal history of nicotine dependence: Secondary | ICD-10-CM | POA: Insufficient documentation

## 2015-01-28 DIAGNOSIS — H02535 Eyelid retraction left lower eyelid: Secondary | ICD-10-CM

## 2015-01-28 DIAGNOSIS — F329 Major depressive disorder, single episode, unspecified: Secondary | ICD-10-CM | POA: Insufficient documentation

## 2015-01-28 DIAGNOSIS — E785 Hyperlipidemia, unspecified: Secondary | ICD-10-CM | POA: Insufficient documentation

## 2015-01-28 DIAGNOSIS — G519 Disorder of facial nerve, unspecified: Secondary | ICD-10-CM

## 2015-01-28 DIAGNOSIS — K219 Gastro-esophageal reflux disease without esophagitis: Secondary | ICD-10-CM | POA: Insufficient documentation

## 2015-01-28 SURGERY — REPAIR ECTROPION BILATERAL
Anesthesia: General | Site: Eye | Laterality: Bilateral | Wound class: Clean Wound: Uninfected operative wounds in which no inflammation occurred

## 2015-01-28 MED ORDER — BUPIVACAINE (PF) 0.75 % (7.5 MG/ML) INJECTION SOLUTION
4.00 mL | Freq: Once | INTRAMUSCULAR | Status: DC | PRN
Start: 2015-01-28 — End: 2015-01-28
  Administered 2015-01-28: 2.5 mL via INTRAMUSCULAR

## 2015-01-28 MED ORDER — MIDAZOLAM 1 MG/ML INJECTION SOLUTION
Freq: Once | INTRAMUSCULAR | Status: DC | PRN
Start: 2015-01-28 — End: 2015-01-28
  Administered 2015-01-28: 2 mg via INTRAVENOUS

## 2015-01-28 MED ORDER — PROPARACAINE 0.5 % EYE DROPS
1.00 [drp] | Freq: Once | OPHTHALMIC | Status: DC | PRN
Start: 2015-01-28 — End: 2015-01-28
  Administered 2015-01-28: 1 [drp] via OPHTHALMIC

## 2015-01-28 MED ORDER — LACTATED RINGERS INTRAVENOUS SOLUTION
INTRAVENOUS | Status: DC
Start: 2015-01-28 — End: 2015-01-28
  Administered 2015-01-28: 0 via INTRAVENOUS

## 2015-01-28 MED ORDER — ONDANSETRON HCL (PF) 4 MG/2 ML INJECTION SOLUTION
Freq: Once | INTRAMUSCULAR | Status: DC | PRN
Start: 2015-01-28 — End: 2015-01-28
  Administered 2015-01-28: 4 mg via INTRAVENOUS

## 2015-01-28 MED ORDER — TOBRAMYCIN-DEXAMETHASONE 0.3 %-0.1 % EYE OINTMENT
TOPICAL_OINTMENT | Freq: Three times a day (TID) | OPHTHALMIC | Status: DC
Start: 2015-01-28 — End: 2015-03-10

## 2015-01-28 MED ORDER — LIDOCAINE-EPINEPHRINE (PF) 2 %-1:200,000 INJECTION SOLUTION
15.00 mL | Freq: Once | INTRAMUSCULAR | Status: DC | PRN
Start: 2015-01-28 — End: 2015-01-28
  Administered 2015-01-28 (×2): 8 mL via INTRADERMAL

## 2015-01-28 MED ORDER — CEFAZOLIN 1 GRAM SOLUTION FOR INJECTION
Freq: Once | INTRAMUSCULAR | Status: DC | PRN
Start: 2015-01-28 — End: 2015-01-28
  Administered 2015-01-28: 2000 mg via INTRAVENOUS

## 2015-01-28 MED ORDER — HYDROCODONE 5 MG-ACETAMINOPHEN 325 MG TABLET
1.00 | ORAL_TABLET | ORAL | Status: DC | PRN
Start: 2015-01-28 — End: 2015-03-25

## 2015-01-28 MED ORDER — LIDOCAINE 20 MG/ML (2 %)-EPINEPHRINE 1:100,000 INJECTION SOLUTION
15.00 mL | Freq: Once | INTRAMUSCULAR | Status: DC | PRN
Start: 2015-01-28 — End: 2015-01-28
  Administered 2015-01-28: 0 via INTRADERMAL
  Filled 2015-01-28: qty 15

## 2015-01-28 MED ORDER — TOBRAMYCIN-DEXAMETHASONE 0.3 %-0.1 % EYE OINTMENT
TOPICAL_OINTMENT | Freq: Once | OPHTHALMIC | Status: DC | PRN
Start: 2015-01-28 — End: 2015-01-28
  Filled 2015-01-28: qty 3.5

## 2015-01-28 MED ORDER — BALANCED SALT SOLUTION COMBINATION NO.2 INTRAOCULAR IRRIGATION
15.00 mL | Freq: Once | INTRAOCULAR | Status: DC | PRN
Start: 2015-01-28 — End: 2015-01-28
  Administered 2015-01-28: 15 mL via OPHTHALMIC

## 2015-01-28 MED ORDER — SODIUM CHLORIDE 0.9 % (FLUSH) INJECTION SYRINGE
2.00 mL | INJECTION | Freq: Three times a day (TID) | INTRAMUSCULAR | Status: DC
Start: 2015-01-28 — End: 2015-01-28

## 2015-01-28 MED ORDER — SODIUM CHLORIDE 0.9 % (FLUSH) INJECTION SYRINGE
2.00 mL | INJECTION | INTRAMUSCULAR | Status: DC | PRN
Start: 2015-01-28 — End: 2015-01-28

## 2015-01-28 MED ORDER — DEXAMETHASONE SODIUM PHOSPHATE 10 MG/ML INJECTION SOLUTION
Freq: Once | INTRAMUSCULAR | Status: DC | PRN
Start: 2015-01-28 — End: 2015-01-28
  Administered 2015-01-28: 10 mg via INTRAVENOUS

## 2015-01-28 MED ORDER — ERYTHROMYCIN 5 MG/GRAM (0.5 %) EYE OINTMENT
TOPICAL_OINTMENT | Freq: Once | OPHTHALMIC | Status: DC | PRN
Start: 2015-01-28 — End: 2015-01-28
  Administered 2015-01-28: 0 mg via OPHTHALMIC

## 2015-01-28 MED ADMIN — sodium chloride 0.9 % (flush) injection syringe: INTRAVENOUS | @ 17:00:00

## 2015-01-28 SURGICAL SUPPLY — 30 items
APPLICATOR COT TIP STRL 3IN 9318100 100/CS (WOUND CARE SUPPLY) ×8 IMPLANT
APPLICATOR COT TIP STRL 3IN 9318100 100/CS (WOUND CARE/ENTEROSTOMAL SUPPLY) ×4
CORD BIPOLAR FOOTSWITCH 12FT E0509 50EA/CS STRL DISP (CAUTERY SUPPLIES) ×6 IMPLANT
DROPPER MED GLS 3IN 1.5ML STR_PIP STRL LF (Cautery Accessories) ×1
DROPPER MED GLS STR STY PIP RUB BULB STRL DISP 3IN (Cautery Accessories) ×2 IMPLANT
DUPE USE ITEM 319433 - SUTURE 4-0 P2 VICRYL 18IN UNDY_ED BRD COAT ABS (SUTURE/WOUND CLOSURE) ×4 IMPLANT
DUPE USE ITEM 319492 - SUTURE CHR GUT 7-0 TG140-8 MIC_ROP 18IN BLU 2 ARM MONOF ABS (SUTURE/WOUND CLOSURE) ×2 IMPLANT
ELECTRODE ESURG BLADE PNCL 10FT VLAB STRL SS DISP BUTTON SWH HEX LOCK CORD HLSTR LF  ACPT 3/32IN STD (CAUTERY SUPPLIES) ×2 IMPLANT
ELECTRODE ESURG BLADE PNCL 10F_T VLAB STRL SS DISP BUTTON SWH (CAUTERY SUPPLIES) ×1
ELECTRODE PATIENT RTN 9FT VLAB C30- LB RM PHSV ACRL FOAM CORD NONIRRITATE NONSENSITIZE ADH STRP (CAUTERY SUPPLIES) ×2 IMPLANT
ELECTRODE PATIENT RTN 9FT VLAB_REM C30- LB PLHSV ACRL FOAM (CAUTERY SUPPLIES) ×1
KIT SETUP MINOR SURGICAL (KITS & TRAYS (DISPOSABLE)) ×3 IMPLANT
LABEL E-Z STICK_STLEZP1 100EA/CS (LABELS/CHART SUPPLIES) ×1
LABEL MED EZ PEEL MRKR LF (LABELS/CHART SUPPLIES) ×2 IMPLANT
NEEDLE 3CM COLORADO MICRO STR HEAT RST TIP UL SHRP DSCT TUNG NPTN E-SEP SMOKE EVAC PNCL LF  DISP (NEEDLES & SYRINGE SUPPLIES) ×2 IMPLANT
NEEDLE 3CM COLORADO MICRO STR_HEAT RST TIP UL SHRP DSCT TUNG (NEEDLES & SYRINGE SUPPLIES) ×1
PACK EYE PREP (TRAY) ×1
PACK SURG CSTM EYE PREP STRL DISP LF (TRAY) ×2
PACK SURG CUSTOM EYE PREP STRL DISP LF (TRAY) ×2 IMPLANT
PACK SURG ECLIPSE EENT II TBL CVR SUT BAG HEAD TRBN DRP 90X50IN 40X27IN LF (DRAPE/PACKS/SHEETS/OR TOWEL) ×2 IMPLANT
PACK SURG EENT II DYNJP7010 (DRAPE/PACKS/SHEETS/OR TOWEL) ×1
PAD ARMBOARD FOAM BLU_FP-ECARM (POSITIONING PRODUCTS) ×4
PAD ARMBRD BLU (POSITIONING PRODUCTS) ×4 IMPLANT
SUTURE 4-0 P2 VICRYL 18IN UNDY_ED BRD COAT ABS (SUTURE/WOUND CLOSURE) ×2
SUTURE CHR GUT 7-0 TG140-8 MIC_ROP 18IN BLU 2 ARM MONOF ABS (SUTURE/WOUND CLOSURE) ×1
SYRINGE HYPO 10CC LL 309604 100/BX (Syringes w/ o Needles) ×9 IMPLANT
TIP SUCT ARGYLE CURITY FRZR 8FR BRSS PLASTIC NI REM OBTURATOR NCDTV HNDL PLBL STRL LF  DISP (SURGICAL INSTRUMENTS) ×2 IMPLANT
TUBE FRAZIER INST SUCT 8FR_166024 (INSTRUMENTS) ×1
TUBING SUCT CLR 20FT 9/32IN MEDIVAC NCDTV M/M CONN STRL LF (Suction) ×2 IMPLANT
TUBING SUCT CONN 20FT LONG_STRL N720A (Suction) ×1

## 2015-01-28 NOTE — Anesthesia Transfer of Care (Signed)
ANESTHESIA TRANSFER OF CARE NOTE        Anesthesia Service      [facility]         Last Vitals: Temperature: 36.6 C (97.9 F) (01/28/15 1641)  Heart Rate: 53 (01/28/15 1641)  BP (Non-Invasive): 140/76 mmHg (01/28/15 1641)  Respiratory Rate: 20 (01/28/15 1641)  SpO2-1: 97 % (01/28/15 1641)    Patient transferred to RR in stable condition. Report given to RN.    4/5/2016at 16:41.

## 2015-01-28 NOTE — H&P (Addendum)
[facility]                                                     H&P UPDATE FORM    Patient:  [name]  Med Record # [id number]  Date of Birth:   20-Dec-1955  Age:  59 y.o.   Gender: female    Date of Admission: 01/28/2015    Outpatient Pre-Surgical H & P updated the day of the procedure.  1.I reviewed the H&P completed in 30 days of today's surgical procedure by Barbara Barrier, MD on  01/27/2015. Assessment remains unchanged based on completion of today's re-assessment.        Change in medications: No      Last Menstrual Period: No/not applicable      Comments:     2.  Patient continues to be appropiate candidate for planned surgical procedure. YES    Barbara Media, MD  01/28/2015, 13:57        01/29/2015  I saw and examined the patient.  I reviewed the fellow's note.  I agree with the findings and plan of care as documented in the fellow's note.  Any exceptions/additions are edited/noted.    Barbara Cash, MD 01/29/2015, 13:59

## 2015-01-28 NOTE — Discharge Instructions (Signed)
SURGICAL DISCHARGE INSTRUCTIONS     Dr. Levonne Lapping, Anderson Malta, MD  performed your REPAIR ECTROPION BILATERAL, EYELID RETRACTION REPAIR WITH EAR CARTILAGE GRAFT today at the [facility]:  Monday through Friday from 6 a.m. - 7 p.m.: [phone]  Between 7 p.m. - 6 a.m., weekends and holidays:  Call [facility] at [phone] or [phone].    PLEASE SEE WRITTEN HANDOUTS AS DISCUSSED BY YOUR NURSE:      SIGNS AND SYMPTOMS OF A WOUND / INCISION INFECTION   Be sure to watch for the following:   Increase in redness or red streaks near or around the wound or incision.   Increase in pain that is intense or severe and cannot be relieved by the pain medication that your doctor has given you.   Increase in swelling that cannot be relieved by elevation of a body part, or by applying ice, if permitted.   Increase in drainage, or if yellow / green in color and smells bad. This could be on a dressing or a cast.   Increase in fever for longer than 24 hours, or an increase that is higher than 101 degrees Fahrenheit (normal body temperature is 98 degrees Fahrenheit). The incision may feel warm to the touch.    **CALL YOUR DOCTOR IF ONE OR MORE OF THESE SIGNS / SYMPTOMS SHOULD OCCUR.    ANESTHESIA INFORMATION   ANESTHESIA -- ADULT PATIENTS:  You have received intravenous sedation / general anesthesia, and you may feel drowsy and light-headed for several hours. You may even experience some forgetfulness of the procedure. DO NOT DRIVE A MOTOR VEHICLE or perform any activity requiring complete alertness or coordination until you feel fully awake in about 24-48 hours. Do not drink alcoholic beverages for at least 24 hours. Do not stay alone, you must have a responsible adult available to be with you. You may also experience a dry mouth or nausea for 24 hours. This is a normal side effect and will disappear as the effects of the medication wear off.    REMEMBER   If you experience any  difficulty breathing, chest pain, bleeding that you feel is excessive, persistent nausea or vomiting or for any other concerns:  Call your physician Dr. Kennith Center at [phone] or [phone]. You may also ask to have the eye doctor on call paged. They are available to you 24 hours a day.    SPECIAL INSTRUCTIONS / COMMENTS       FOLLOW-UP APPOINTMENTS   Please call patient services at [phone] or [phone] to schedule a date / time of return. They are open Monday - Friday from 7:30 am - 5:00 pm.

## 2015-01-28 NOTE — OR PostOp (Signed)
AVand instructions reviewed with pt and daughter. D/C'd via W/C, escorted by PCA.

## 2015-01-28 NOTE — OR Surgeon (Addendum)
[facility] OF OPHTHALMOLOGY - OP-NOTE    PATIENT NAME:  [place] NAME:  [id number]  DATE OF SERVICE:  01/28/2015  DATE OF BIRTH:  1956-09-27    PREOPERATIVE DIAGNOSIS:    1. Bilateral lower eyelid ectropion   2. Bilateral lower eyelid retraction   3. 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 Bilateral lower eyelid ectropion repair by lateral tarsal strip   2. Bilateral lower eyelid retraction repair by ear cartilage elevation   3. Harvest of cartilage, right ear    SURGEON:  [name], MD  ASSISTANT:  Wylene Simmer, MD, PhD    ANESTHESIA:  MAC/local    ESTIMATED BLOOD LOSS:  < 5 cc    COMPLICATIONS:  none    DESCRIPTION OF PROCEDURE: After obtaining informed consent, the patient was brought back to the operating room and laid supine on the operating room table. Sedation was then administered by the anesthesia team. The lower eyelids and the right ear were anesthetized with a mixture of 2% lidocaine with 1:200,000 dilution epinephrine. The patient was then prepped and draped in the usual sterile ophthalmic fashion.     Corneal shields were placed. Attention was then directed to the right ear where a #15 blade was used to incise along the helix. The skin and subcutaneous tissue was elevated off the cartilage with Westcott scissors and an approximately 18 x 16-mm cartilage graft was excised with Westcott scissors and a 15 blade. The graft was morcellized and set aside on the back table for later use and hemostasis was achieved and the incision was closed with a running 6-0 vicryl suture.    A Jaffe lid speculum was then placed in the upper eyelids. A lateral canthotomy and cantholysis was performed with monopolar cautery and hemostasis was achieved with bipolar cautery. A lateral tarsal strip was created at each side by removing the lateral marginal epithelium separating the  anterior and posterior lamella, removing a wedge of anterior lamella and creating an infratarsal incision with Westcott scissors. The posterior conjunctiva was removed with a #15 blade. Two Freer elevators were used to expose periosteum of the lateral orbital rim at the level of Whitnall tubercle. The tarsal strip was secured to the periosteum with two 4-0 Vicryl sutures, which were left untied. The same steps were repeated on both sides.     Attention was then directed to the lower eyelid . An infratarsal conjunctival incision was made with thermal cautery and thermal cautery was then used to dissect away the conjunctiva. The lower eyelid retractions were then disinserted free with thermal cautery. The ear cartilage graft was morcellated on the back table and secured to the anterior edge of the tarsus in the lower lid with 7-0 chromic suture. The graft was then flipped and smoothed into position and the lower eyelid and the conjunctiva was closed with a 7-0 chromic suture. The tarsal strip sutures were then tied and trimmed.  The same steps were performed on both sides.      Attention was then directed back to the lateral canthus, where the 4-0 vicryl sutures were tied and trimmed.  The orbicularis was closed with 4-0 vicryl suture, and the skin was closed with 7-0 chromic suture.      The corneal shields were removed. Ointment was placed in both eyes and the ear. The patient was returned to the recovery  room.     Dr. Kennith Center was present for and performed all critical portions of the procedure.     Shelia Media, MD 01/28/2015, 16:39    I was present for all key and/or critical portions of the case and immediately available at all times.      [name], MD 01/29/2015, 13:58

## 2015-01-28 NOTE — OR PostOp (Signed)
[facility] OF OPHTHALMOLOGY - DISCHARGE NOTE    PATIENT NAME:  [name] NAME:  [id number]  DATE OF SERVICE:  01/28/2015  DATE OF BIRTH:  Mar 23, 1956    Patient meets discharge criteria.  Discharge to home  RTC as scheduled or sooner prn any problem.     Shelia Media, MD 01/28/2015, 17:03

## 2015-02-04 NOTE — Anesthesia Postprocedure Evaluation (Signed)
ANESTHESIA POSTOP EVALUATION NOTE        Anesthesia Service      [facility]     02/04/2015     Last Vitals: Temperature: 36.8 C (98.2 F) (01/28/15 1713)  Heart Rate: 68 (01/28/15 1713)  BP (Non-Invasive): (!) 114/56 mmHg (01/28/15 1713)  Respiratory Rate: 16 (01/28/15 1713)  SpO2-1: 94 % (01/28/15 1713)  Pain Score (Numeric, Faces): 0 (01/28/15 1713)    Procedure(s):  REPAIR ECTROPION BILATERAL  EYELID RETRACTION REPAIR WITH EAR CARTILAGE GRAFT    Patient is sufficiently recovered from the effects of anesthesia to participate in the evaluation and has returned to their pre-procedure level.  I have reviewed and evaluated the following:  Respiratory Function: Consistent with pre anesthetic level  Cardiovascular Function: Consistent with pre anesthetic level  Mental Status: Return to pre anesthetic baseline level  Pain: Sufficiently controlled with medication  Nausea and Vomiting: Absent or sufficiently controlled with medication  Post-op Anesthetic Complications: None    Comment/ re-evaluation for any variations: None

## 2015-02-05 ENCOUNTER — Ambulatory Visit
Admission: RE | Admit: 2015-02-05 | Discharge: 2015-02-05 | Disposition: A | Discharge status: Home / Self Care | Payer: MEDICAID | Source: Ambulatory Visit | Attending: OPHTHALMOLOGY | Admitting: OPHTHALMOLOGY

## 2015-02-05 ENCOUNTER — Ambulatory Visit (HOSPITAL_BASED_OUTPATIENT_CLINIC_OR_DEPARTMENT_OTHER): Payer: MEDICAID | Admitting: OPHTHALMOLOGY

## 2015-02-05 ENCOUNTER — Encounter (INDEPENDENT_AMBULATORY_CARE_PROVIDER_SITE_OTHER): Payer: Self-pay | Admitting: OPHTHALMOLOGY

## 2015-02-05 DIAGNOSIS — Z09 Encounter for follow-up examination after completed treatment for conditions other than malignant neoplasm: Secondary | ICD-10-CM

## 2015-02-05 DIAGNOSIS — H02532 Eyelid retraction right lower eyelid: Secondary | ICD-10-CM | POA: Insufficient documentation

## 2015-02-05 DIAGNOSIS — H02535 Eyelid retraction left lower eyelid: Secondary | ICD-10-CM | POA: Insufficient documentation

## 2015-02-05 DIAGNOSIS — Z9889 Other specified postprocedural states: Secondary | ICD-10-CM | POA: Insufficient documentation

## 2015-02-05 NOTE — Progress Notes (Addendum)
[name]ated by [facility]  [address]  [place] [address]  Dept: [facility] Note    02/05/2015  [name],    Date of Birth:  [birth year]    No referring provider defined for this encounter.    Ophthalmologist/Optometrist:     SURGERY  Procedure:  Diagnosis:     ICD-10-CM    1. Postop check Z09 OPH EXTERNAL PHOTOS     Anesthesia:   Length of time:  minutes  Special Needs:       The patient is a 59 y.o. female here with complaint of:  Chief Complaint     Post-op Eye        HPI     Post-op Eye   Laterality: both eyes   Pain scale: 0/10           Comments   Bilateral lower eyelid ectropion repair by lateral tarsal strip. Bilateral lower eyelid retraction repair by ear cartilage elevation . Harvest of cartilage, right ear 01/28/15.  Ou are irritated, watering a lot.         Last edited by Lloyd Huger, COT on 02/05/2015  9:18 AM. (History)            History of Sun Exposure:If yes:   Ptosis evaluation:     MD Addition to HPI:   Patient denies any pain or discharge.  Some tenderness of right ear.  No change in vision or double vision.     Impression and Plan:  1. POW 1 BLL LTS, BLL elevation with ear cartilage from right ear   - doing well   - lids in good position, will await resolution of edema and ecchymosis   - right ear healing well - no e/o infection   - continue Tobradex BID   - RTC as scheduled, sooner PRN     I have reviewed and confirmed the ROS, PFSH, and exam elements performed and documented by the technician. The scribed portion of the progress note was scribed on my behalf and at my direction. I have reviewed and attest to the accuracy of the note.   [name], MD 02/05/2015, 10:09    02/05/2015  I saw and examined the patient.  I reviewed the fellow's note.  I agree with the findings and plan of care as documented in the fellow's note.  Any exceptions/additions are edited/noted.    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 Bilateral lower eyelid retraction repair by ear cartilage elevation. Harvest of cartilage, right ear 01/28/15    ROS     Negative for: Constitutional, Gastrointestinal, Neurological, Skin, Genitourinary, Musculoskeletal, HENT, Endocrine, Cardiovascular, Eyes, Respiratory, Psychiatric, Allergic/Imm, Heme/Lymph    Last edited by Lloyd Huger, COT on 02/05/2015  9:18 AM. (History)            Eye Examination:     Neuro/Psych     Oriented x3:  Yes    Mood/Affect:  Normal        Visual Acuity     Visual Acuity (Snellen - Linear)      Right Left   Dist sc 20/50 20/50             Edited by: Lloyd Huger, COT             Not recorded  Not recorded         Not recorded         Not recorded           Not recorded           OD    OS                                                                Eyelids: BLL edema/ecchymosis, sutures intact.   R ear healing well - no infection, no hematoma - sutures intact  Lateral canthus well approximated   Lid edema OD:2 OS: 2 Conj chem OD: None OS:2 chemosis overriding punctum    Lid inject OD: 2 OS: 2 Conj inject OD: 1 OS: 1       9   5    PF   MRD       9   5             LF   LC                 0   0   USS   LSS       0   0       Tr    LAG       0     Wounds well approximated   SLE  OD OS     Tear Assessment 0.75 lake over inf cornea  0.75 lake over inf cornea     Cornea Clear Clear     AC D&Q D&Q    Iris intact intact    Lens Clear Clear       IOP straight Tonometry     Tonometry (Tonopen, 9:30 AM)      Right Left   Pressure 13 14             Edited by: Kirstie Mirza A, COT                Upgaze     5 down        Fundus Dilated no    Last Dilated      OD OS   Disc     Macula     Vessels     Periphery            Past Medical / Surgical / Family / Social History:     Past Medical History   Diagnosis Date    Multinodular goiter     Headache(784.0)      Migraines       Arthritis     Vaginal prolapse 2012     surgery improved significantly    Rheumatic fever 16     no complications    Depression     GERD (gastroesophageal reflux disease)      controlled w/med    Wears glasses     Wears dentures     Hyperlipidemia LDL goal < 100 08/29/2013    Heart murmur 1979     benign    MINOR CAD (coronary artery disease) 11/10/2012     no treatment other than cholesterol medications; follows regularly with Dr. Rodney Booze           Past Surgical History   Procedure Laterality Date  Pb upper gi endoscopy,biopsy  12/19/07     patulous GE junction zone, erythema, nonerosive GERD    Hx ankle fracture tx  2007     left distal fibula, casted    Hx wrist fracture tx  2007     [place] injury    Hx total vaginal hysterectomy  1979    Colonoscopy  01/06/09     COLONOSCOPY performed by Loletta Parish F at [place]    Hx cystocele repair  09/24/09    Hx gall bladder surgery/chole      Hx adenoidectomy      Hx tonsillectomy      Gastroscopy  03/05/2010     GASTROSCOPY performed by Jocelyn Lamer at [place] with biopsy  03/05/2010     GASTROSCOPY WITH BIOPSY performed by Jocelyn Lamer at [place]    Pb revise ulnar nerve at elbow  1979    Hx appendectomy      Hx colonoscopy      Hx hand surgery  2012     for Carpal tunnel     Hx oophorectomy       left ovary removed           Family History   Problem Relation Age of Onset    Congestive Heart Failure Father 32     died at 64    Coronary Artery Disease Father     Heart Attack Father     Heart Attack Sister      50    Hypertension Brother     Hypertension Sister     Hypertension Sister     Kidney Disease Sister      ESRD    Stroke Paternal Grandmother     Diabetes Mother     High Cholesterol Mother     Hypertension Mother     Coronary Artery Disease Mother 83     CABG    Thyroid Disease Sister     Bipolar Disorder Daughter     Cancer Paternal Aunt      Breast Ca    Cancer Paternal Aunt      Breast Ca     Cancer Paternal Aunt      Breast Ca    Cancer Paternal Aunt      Breast Ca    Cancer Paternal Aunt      Breast Ca    Leukemia Paternal Uncle     Cancer Other 52     Breast Ca/Breast Ca    Leukemia Paternal Uncle            History   Substance Use Topics    Smoking status: Former Smoker -- 2.00 packs/day for 40 years     Types: Cigarettes     Quit date: 01/22/2005    Smokeless tobacco: Never Used    Alcohol Use: No       I have seen and examined the above patient. I discussed the above diagnoses listed in the assessment and the above ophthalmic plan of care with the patient and patient's family. All questions were answered. I reviewed and, when necessary, made changes to the technician/resident note, documented ophthalmology exam, chief complaint, history of present illness, allergies, review of systems, past medical, past surgical, family and social history.      [name], M.D.  02/05/2015, 09:33      I scribed a portion of the encounter including Chief Complaint, HPI, Impression, Plan, and  exam elements excluding visual acuity, lensometry, pupil assessment, confrontational visual fields, tonometry results, extraocular motility assessment, refractometry results, and assessment of mood and orientation. I scribed this note at the request of the physician who personally performed the services documented.  Edwena Bunde, SCRIBE  02/05/2015, 09:38

## 2015-02-06 ENCOUNTER — Encounter (HOSPITAL_COMMUNITY)
Admission: RE | Disposition: A | Discharge status: Home / Self Care | Payer: Self-pay | Source: Ambulatory Visit | Attending: Otolaryngology

## 2015-02-06 ENCOUNTER — Inpatient Hospital Stay (HOSPITAL_COMMUNITY): Payer: MEDICAID | Admitting: Certified Registered"

## 2015-02-06 ENCOUNTER — Encounter (HOSPITAL_COMMUNITY): Payer: Self-pay

## 2015-02-06 ENCOUNTER — Inpatient Hospital Stay
Admission: RE | Admit: 2015-02-06 | Discharge: 2015-02-08 | DRG: 139 | Disposition: A | Discharge status: Home / Self Care | Payer: MEDICAID | Source: Ambulatory Visit | Attending: Otolaryngology | Admitting: Otolaryngology

## 2015-02-06 ENCOUNTER — Inpatient Hospital Stay (HOSPITAL_COMMUNITY): Payer: MEDICAID | Admitting: Otolaryngology

## 2015-02-06 DIAGNOSIS — Z90721 Acquired absence of ovaries, unilateral: Secondary | ICD-10-CM | POA: Diagnosis present

## 2015-02-06 DIAGNOSIS — F329 Major depressive disorder, single episode, unspecified: Secondary | ICD-10-CM | POA: Diagnosis present

## 2015-02-06 DIAGNOSIS — E042 Nontoxic multinodular goiter: Secondary | ICD-10-CM | POA: Diagnosis present

## 2015-02-06 DIAGNOSIS — Z79891 Long term (current) use of opiate analgesic: Secondary | ICD-10-CM

## 2015-02-06 DIAGNOSIS — Z87891 Personal history of nicotine dependence: Secondary | ICD-10-CM

## 2015-02-06 DIAGNOSIS — Z9049 Acquired absence of other specified parts of digestive tract: Secondary | ICD-10-CM | POA: Diagnosis present

## 2015-02-06 DIAGNOSIS — Z886 Allergy status to analgesic agent status: Secondary | ICD-10-CM

## 2015-02-06 DIAGNOSIS — M179 Osteoarthritis of knee, unspecified: Secondary | ICD-10-CM | POA: Diagnosis present

## 2015-02-06 DIAGNOSIS — R221 Localized swelling, mass and lump, neck: Secondary | ICD-10-CM

## 2015-02-06 DIAGNOSIS — M1990 Unspecified osteoarthritis, unspecified site: Secondary | ICD-10-CM | POA: Diagnosis present

## 2015-02-06 DIAGNOSIS — Z888 Allergy status to other drugs, medicaments and biological substances status: Secondary | ICD-10-CM

## 2015-02-06 DIAGNOSIS — G894 Chronic pain syndrome: Secondary | ICD-10-CM | POA: Diagnosis present

## 2015-02-06 DIAGNOSIS — I2510 Atherosclerotic heart disease of native coronary artery without angina pectoris: Secondary | ICD-10-CM | POA: Diagnosis present

## 2015-02-06 DIAGNOSIS — D110 Benign neoplasm of parotid gland: Principal | ICD-10-CM | POA: Diagnosis present

## 2015-02-06 DIAGNOSIS — Z90710 Acquired absence of both cervix and uterus: Secondary | ICD-10-CM

## 2015-02-06 DIAGNOSIS — IMO0002 Reserved for concepts with insufficient information to code with codable children: Secondary | ICD-10-CM

## 2015-02-06 DIAGNOSIS — K219 Gastro-esophageal reflux disease without esophagitis: Secondary | ICD-10-CM | POA: Diagnosis present

## 2015-02-06 DIAGNOSIS — Q384 Congenital malformations of salivary glands and ducts: Secondary | ICD-10-CM

## 2015-02-06 HISTORY — DX: Reserved for concepts with insufficient information to code with codable children: IMO0002

## 2015-02-06 LAB — PERFORM POC WHOLE BLOOD GLUCOSE
GLUCOSE, POINT OF CARE: 82 mg/dL (ref 70–105)
GLUCOSE, POINT OF CARE: 104 mg/dL (ref 70–105)

## 2015-02-06 SURGERY — PAROTIDECTOMY SUPERFICIAL WITH FACIAL NERVE DISSECTION
Anesthesia: General | Site: Neck | Laterality: Right | Wound class: Clean Wound: Uninfected operative wounds in which no inflammation occurred

## 2015-02-06 MED ORDER — MORPHINE 2 MG/ML INTRAVENOUS CARTRIDGE
2.0000 mg | CARTRIDGE | INTRAVENOUS | Status: DC | PRN
Start: 2015-02-06 — End: 2015-02-07
  Administered 2015-02-06 – 2015-02-07 (×3): 2 mg via INTRAVENOUS
  Filled 2015-02-06 (×3): qty 1

## 2015-02-06 MED ORDER — SODIUM CHLORIDE 0.9 % (FLUSH) INJECTION SYRINGE
2.00 mL | INJECTION | Freq: Three times a day (TID) | INTRAMUSCULAR | Status: DC
Start: 2015-02-06 — End: 2015-02-08
  Administered 2015-02-06: 0 mL
  Administered 2015-02-07 – 2015-02-08 (×5): 2 mL

## 2015-02-06 MED ORDER — ONDANSETRON HCL (PF) 4 MG/2 ML INJECTION SOLUTION
4.00 mg | Freq: Three times a day (TID) | INTRAMUSCULAR | Status: DC | PRN
Start: 2015-02-06 — End: 2015-02-08

## 2015-02-06 MED ORDER — HYDROMORPHONE 1 MG/ML INJECTION WRAPPER
0.40 mg | INJECTION | INTRAMUSCULAR | Status: DC | PRN
Start: 2015-02-06 — End: 2015-02-06
  Administered 2015-02-06 (×2): 0.4 mg via INTRAVENOUS
  Filled 2015-02-06: qty 1

## 2015-02-06 MED ORDER — HYDROCODONE 10 MG-ACETAMINOPHEN 325 MG TABLET
1.0000 | ORAL_TABLET | ORAL | Status: DC | PRN
Start: 2015-02-06 — End: 2015-02-08
  Administered 2015-02-06 – 2015-02-08 (×8): 1 via ORAL
  Filled 2015-02-06 (×8): qty 1

## 2015-02-06 MED ORDER — EPHEDRINE SULFATE 50 MG/ML INJECTION SOLUTION
Freq: Once | INTRAMUSCULAR | Status: DC | PRN
Start: 2015-02-06 — End: 2015-02-06
  Administered 2015-02-06: 7.5 mg via INTRAVENOUS

## 2015-02-06 MED ORDER — TOBRAMYCIN-DEXAMETHASONE 0.3 %-0.1 % EYE OINTMENT
TOPICAL_OINTMENT | Freq: Three times a day (TID) | OPHTHALMIC | Status: DC
Start: 2015-02-06 — End: 2015-02-08
  Filled 2015-02-06: qty 4

## 2015-02-06 MED ORDER — SODIUM CHLORIDE 0.9 % INTRAVENOUS SOLUTION
3.00 g | Freq: Once | INTRAVENOUS | Status: AC
Start: 2015-02-06 — End: 2015-02-06
  Administered 2015-02-06: 3 g via INTRAVENOUS
  Filled 2015-02-06: qty 20

## 2015-02-06 MED ORDER — ONDANSETRON HCL (PF) 4 MG/2 ML INJECTION SOLUTION
4.00 mg | Freq: Once | INTRAMUSCULAR | Status: DC
Start: 2015-02-06 — End: 2015-02-06

## 2015-02-06 MED ORDER — SODIUM CHLORIDE 0.9 % IRRIGATION SOLUTION
1000.00 mL | Status: DC | PRN
Start: 2015-02-06 — End: 2015-02-06
  Administered 2015-02-06: 2000 mL

## 2015-02-06 MED ORDER — DEXAMETHASONE SODIUM PHOSPHATE 4 MG/ML INJECTION SOLUTION
Freq: Once | INTRAMUSCULAR | Status: DC | PRN
Start: 2015-02-06 — End: 2015-02-06
  Administered 2015-02-06: 10 mg via INTRAVENOUS

## 2015-02-06 MED ORDER — FLUOXETINE 20 MG/5 ML (4 MG/ML) ORAL SOLUTION
20.0000 mg | Freq: Every day | ORAL | Status: DC
Start: 2015-02-06 — End: 2015-02-08
  Administered 2015-02-06 – 2015-02-08 (×2): 20 mg via ORAL
  Filled 2015-02-06 (×2): qty 5

## 2015-02-06 MED ORDER — SODIUM CHLORIDE 0.9 % INTRAVENOUS SOLUTION
12.50 mg | Freq: Once | INTRAVENOUS | Status: DC | PRN
Start: 2015-02-06 — End: 2015-02-06

## 2015-02-06 MED ORDER — SODIUM CHLORIDE 0.9 % INTRAVENOUS SOLUTION
INTRAVENOUS | Status: DC | PRN
Start: 2015-02-06 — End: 2015-02-06

## 2015-02-06 MED ORDER — LIDOCAINE (PF) 100 MG/5 ML (2 %) INTRAVENOUS SYRINGE
INJECTION | Freq: Once | INTRAVENOUS | Status: DC | PRN
Start: 2015-02-06 — End: 2015-02-06
  Administered 2015-02-06: 70 mg via INTRAVENOUS

## 2015-02-06 MED ORDER — LACTATED RINGERS INTRAVENOUS SOLUTION
INTRAVENOUS | Status: DC
Start: 2015-02-06 — End: 2015-02-06

## 2015-02-06 MED ORDER — ACETAMINOPHEN 1,000 MG/100 ML (10 MG/ML) INTRAVENOUS SOLUTION
Freq: Once | INTRAVENOUS | Status: DC | PRN
Start: 2015-02-06 — End: 2015-02-06
  Administered 2015-02-06: 1000 mg via INTRAVENOUS

## 2015-02-06 MED ORDER — ONDANSETRON HCL (PF) 4 MG/2 ML INJECTION SOLUTION
Freq: Once | INTRAMUSCULAR | Status: DC | PRN
Start: 2015-02-06 — End: 2015-02-06
  Administered 2015-02-06: 4 mg via INTRAVENOUS

## 2015-02-06 MED ORDER — FLUOROMETHOLONE 0.1 % EYE DROPS,SUSPENSION
1.00 [drp] | Freq: Two times a day (BID) | OPHTHALMIC | Status: DC
Start: 2015-02-06 — End: 2015-02-08
  Administered 2015-02-06 – 2015-02-07 (×2): 0 [drp] via OPHTHALMIC
  Administered 2015-02-07 – 2015-02-08 (×2): 1 [drp] via OPHTHALMIC

## 2015-02-06 MED ORDER — BACITRACIN 500 UNIT/G OINTMENT TUBE
TOPICAL_OINTMENT | CUTANEOUS | Status: AC
Start: 2015-02-06 — End: 2015-02-06
  Filled 2015-02-06: qty 57

## 2015-02-06 MED ORDER — ROCURONIUM 10 MG/ML INTRAVENOUS SOLUTION
Freq: Once | INTRAVENOUS | Status: DC | PRN
Start: 2015-02-06 — End: 2015-02-06
  Administered 2015-02-06: 10 mg via INTRAVENOUS

## 2015-02-06 MED ORDER — BACITRACIN 500 UNIT/G OINTMENT TUBE
TOPICAL_OINTMENT | Freq: Two times a day (BID) | CUTANEOUS | Status: DC
Start: 2015-02-06 — End: 2015-02-08
  Filled 2015-02-06: qty 15

## 2015-02-06 MED ORDER — BACITRACIN 500 UNIT/G OINTMENT TUBE
TOPICAL_OINTMENT | Freq: Once | CUTANEOUS | Status: DC | PRN
Start: 2015-02-06 — End: 2015-02-06
  Administered 2015-02-06: 1 via TOPICAL

## 2015-02-06 MED ORDER — HYDROMORPHONE 1 MG/ML INJECTION WRAPPER
INJECTION | Freq: Once | INTRAMUSCULAR | Status: DC | PRN
Start: 2015-02-06 — End: 2015-02-06
  Administered 2015-02-06 (×2): 0.2 mg via INTRAVENOUS

## 2015-02-06 MED ORDER — SCOPOLAMINE 1 MG OVER 3 DAYS TRANSDERMAL PATCH
1.50 mg | MEDICATED_PATCH | Freq: Once | TRANSDERMAL | Status: DC
Start: 2015-02-06 — End: 2015-02-08
  Administered 2015-02-06: 1.5 mg via TRANSDERMAL
  Filled 2015-02-06: qty 1

## 2015-02-06 MED ORDER — METOCLOPRAMIDE 5 MG/ML INJECTION SOLUTION
20.00 mg | Freq: Once | INTRAMUSCULAR | Status: DC | PRN
Start: 2015-02-06 — End: 2015-02-06

## 2015-02-06 MED ORDER — PROCHLORPERAZINE EDISYLATE 10 MG/2 ML (5 MG/ML) INJECTION SOLUTION
5.00 mg | Freq: Once | INTRAMUSCULAR | Status: DC | PRN
Start: 2015-02-06 — End: 2015-02-06

## 2015-02-06 MED ORDER — PROPOFOL 10 MG/ML IV BOLUS
INJECTION | Freq: Once | INTRAVENOUS | Status: DC | PRN
Start: 2015-02-06 — End: 2015-02-06
  Administered 2015-02-06: 150 mg via INTRAVENOUS

## 2015-02-06 MED ORDER — FENTANYL (PF) 50 MCG/ML INJECTION SOLUTION
Freq: Once | INTRAMUSCULAR | Status: DC | PRN
Start: 2015-02-06 — End: 2015-02-06
  Administered 2015-02-06 (×2): 50 ug via INTRAVENOUS
  Administered 2015-02-06: 150 ug via INTRAVENOUS

## 2015-02-06 MED ORDER — PHENYLEPHRINE 0.5 MG/5 ML (100 MCG/ML)IN 0.9 % SOD.CHLORIDE IV SYRINGE
INJECTION | Freq: Once | INTRAVENOUS | Status: DC | PRN
Start: 2015-02-06 — End: 2015-02-06
  Administered 2015-02-06: 150 ug via INTRAVENOUS

## 2015-02-06 MED ORDER — ENOXAPARIN 40 MG/0.4 ML SUBCUTANEOUS SYRINGE
40.0000 mg | INJECTION | SUBCUTANEOUS | Status: DC
Start: 2015-02-07 — End: 2015-02-08
  Administered 2015-02-07 – 2015-02-08 (×2): 40 mg via SUBCUTANEOUS
  Filled 2015-02-06 (×2): qty 0.4

## 2015-02-06 MED ORDER — MIDAZOLAM 1 MG/ML INJECTION SOLUTION
Freq: Once | INTRAMUSCULAR | Status: DC | PRN
Start: 2015-02-06 — End: 2015-02-06
  Administered 2015-02-06: 2 mg via INTRAVENOUS

## 2015-02-06 MED ORDER — SODIUM CHLORIDE 0.9 % (FLUSH) INJECTION SYRINGE
2.00 mL | INJECTION | INTRAMUSCULAR | Status: DC | PRN
Start: 2015-02-06 — End: 2015-02-08

## 2015-02-06 MED ORDER — SUCCINYLCHOLINE CHLORIDE 200 MG/10 ML (20 MG/ML) INTRAVENOUS SYRINGE
INJECTION | Freq: Once | INTRAVENOUS | Status: DC | PRN
Start: 2015-02-06 — End: 2015-02-06
  Administered 2015-02-06: 60 mg via INTRAVENOUS

## 2015-02-06 MED ORDER — PHENYLEPHRINE 60MG IN NS 250ML INFUSION - FOR ANES
INTRAVENOUS | Status: DC | PRN
Start: 2015-02-06 — End: 2015-02-06
  Administered 2015-02-06: 0 ug/kg/min via INTRAVENOUS
  Administered 2015-02-06 (×2): .1 ug/kg/min via INTRAVENOUS
  Administered 2015-02-06: 0.5 ug/kg/min via INTRAVENOUS
  Administered 2015-02-06: 0.3 ug/kg/min via INTRAVENOUS
  Administered 2015-02-06: .1 ug/kg/min via INTRAVENOUS
  Administered 2015-02-06: 0.2 ug/kg/min via INTRAVENOUS
  Administered 2015-02-06: 0.3 ug/kg/min via INTRAVENOUS
  Administered 2015-02-06: 0 ug/kg/min via INTRAVENOUS
  Administered 2015-02-06: 0.5 ug/kg/min via INTRAVENOUS

## 2015-02-06 MED ORDER — POTASSIUM CHLORIDE 20 MEQ/L IN DEXTROSE 5 %-0.45 % SODIUM CHLORIDE IV
INTRAVENOUS | Status: DC
Start: 2015-02-06 — End: 2015-02-06

## 2015-02-06 MED ORDER — REMIFENTANIL 50 MCG/ML INFUSION - FOR ANES
INTRAVENOUS | Status: DC | PRN
Start: 2015-02-06 — End: 2015-02-06
  Administered 2015-02-06: 0 ug/kg/min via INTRAVENOUS
  Administered 2015-02-06: 0.05 ug/kg/min via INTRAVENOUS

## 2015-02-06 MED ORDER — LIDOCAINE 1 %-EPINEPHRINE 1:100,000 INJECTION SOLUTION
5.00 mL | Freq: Once | INTRAMUSCULAR | Status: DC | PRN
Start: 2015-02-06 — End: 2015-02-06
  Administered 2015-02-06: 8 mL via INTRAMUSCULAR

## 2015-02-06 MED ORDER — HYDROMORPHONE 1 MG/ML INJECTION WRAPPER
0.20 mg | INJECTION | INTRAMUSCULAR | Status: DC | PRN
Start: 2015-02-06 — End: 2015-02-06
  Administered 2015-02-06: 0.2 mg via INTRAVENOUS

## 2015-02-06 MED ORDER — DEXAMETHASONE SODIUM PHOSPHATE 4 MG/ML INJECTION SOLUTION
4.00 mg | Freq: Once | INTRAMUSCULAR | Status: DC
Start: 2015-02-06 — End: 2015-02-06

## 2015-02-06 MED ORDER — AMPICILLIN-SULBACTAM 15 GRAM SOLUTION FOR INJECTION
3.0000 g | Freq: Once | INTRAMUSCULAR | Status: AC
Start: 2015-02-06 — End: 2015-02-06
  Administered 2015-02-06: 3 g via INTRAVENOUS
  Filled 2015-02-06: qty 20

## 2015-02-06 MED ORDER — ATENOLOL 25 MG TABLET
25.0000 mg | ORAL_TABLET | Freq: Every day | ORAL | Status: DC
Start: 2015-02-07 — End: 2015-02-08
  Administered 2015-02-07 – 2015-02-08 (×2): 25 mg via ORAL
  Filled 2015-02-06 (×2): qty 1

## 2015-02-06 MED ADMIN — HYDROmorphone (PF) 1 mg/mL injection solution: INTRAVENOUS | @ 17:00:00

## 2015-02-06 MED ADMIN — phenylephrine 10 mg/mL injection solution: INTRAVENOUS | @ 14:00:00

## 2015-02-06 MED ADMIN — nystatin 100,000 unit/gram topical powder: INTRAVENOUS | @ 14:00:00 | NDC 00574200815

## 2015-02-06 MED ADMIN — lactated Ringers intravenous solution: INTRAVENOUS | @ 14:00:00

## 2015-02-06 MED ADMIN — PEDS CUSTOM PARENTERAL NUTRITION - LESS THAN 6 MONTHS: INTRAVENOUS | @ 14:00:00

## 2015-02-06 SURGICAL SUPPLY — 70 items
APPLIER PREM SRGCLP II SUP INTLK 9.75IN ATO INTERNAL CLIP VAS LF  DISP ENDOS RADGR MRK 20 MED TI (ENDOSCOPIC SUPPLIES) ×1 IMPLANT
APPLIER PREM SRGCLP III 9IN 20 CLIP TI SM INTERNAL CLIP DISP (ENDOSCOPIC SUPPLIES) ×2 IMPLANT
APPLIER SURGICLIP MED TI 9.75_134051 6EA/BX (INSTRUMENTS ENDOMECHANICAL) ×1
APPLIER SURGICLIP MULTIPLE SM_133650 6EA/BX (INSTRUMENTS ENDOMECHANICAL) ×2
BLADE 12 BD RB-BCK CBNSTL SURG TISS STRL LF  DISP (SURGICAL CUTTING SUPPLIES) ×1 IMPLANT
BLADE 12 BD RB-BCK SHRP CBNSTL_SURG STRL LF (CUTTING ELEMENTS) ×1
BLANKET 3M BAIR HUG ADLT LWR B ODY 60X36IN PLMR AIR SYS LTWT (MISCELLANEOUS PT CARE ITEMS) ×2 IMPLANT
CATH URETH DOVER RBNL 10FR 16IN 2 STGR DRAIN EYE RND CLS TIP INT FNL CONN PVC STRL LF  DISP (UROLOGICAL SUPPLIES) ×1 IMPLANT
CATH URETH DV RBNL 10FR 16IN 2_STGR DRAIN EYE RND CLS TIP (UROLOGICAL SUPPLIES) ×1
CONV USE 23866 - NEEDLE HYPO 27GA 1.5IN STD MONOJECT SS POLYPROP REG BVL LL HUB UL SHRP ANTICORE YW STRL LF  DISP (NEEDLES & SYRINGE SUPPLIES) ×1 IMPLANT
CONV USE 338643 - PACK SURG HEAD NK NONST DISP LF (CUSTOM TRAYS & PACK) ×1 IMPLANT
COVER CAM DISP (DRAPE/PACKS/SHEETS/OR TOWEL) ×1 IMPLANT
COVER LIGHT SKYTRON CAMERA_B1-715-65 50EA/CS (DRAPE/PACKS/SHEETS/OR TOWEL) ×1
DISCONTINUED USE ITEM 97927 - SUTURE 3-0 SH-1 VICRYL 18IN VIOL CR BRD 8 STRN COAT ABS (SUTURE/WOUND CLOSURE) IMPLANT
DONUT EXTREMITY CUSHIONING 31143137 (POSITIONING PRODUCTS) ×2 IMPLANT
DRAIN CHANNEL 15FR_JP2228 10/BX (Drains/Resovoirs) ×1
DRAIN FLAT PERFERATED_10MM X 4MM 0070440 (Drains/Resovoirs)
DRAIN FLAT SILI 7MM X 10IN_SU1301310 (WOUND CARE/ENTEROSTOMAL SUPPLY)
DRAIN INCS 15FR JP SIL HBLS CHNL STRL LF  30MM RND DISP WHT (Drains/Resovoirs) ×1 IMPLANT
DRAIN INCS 20CMX10MM SIL RADOPQ FULL PRFR HBLS STRL LF  FLAT DISP (Drains/Resovoirs) IMPLANT
DRAIN WOUND 20CMX7MM SIL FULL PRFR WO TROCAR RESERVOIR JP FLAT (WOUND CARE SUPPLY) IMPLANT
DRAPE 44X44IN FLD WRM TEMP CNT_RL VSB DSPL PORT STND EQP ORS (DRAPE/PACKS/SHEETS/OR TOWEL) ×1
DRAPE FLUID WRMR TEMP CONTROL PORT STAND 44X44IN ORS LF  STRL DISP EQP POLYUR CLR (DRAPE/PACKS/SHEETS/OR TOWEL) ×1 IMPLANT
DUPE USE ITEM 319500 - SUTURE 3-0 PC5 ETHILON MTPS 18_IN BLK MONOF NONAB (SUTURE/WOUND CLOSURE) IMPLANT
ELECTRODE ESURG BLADE 2.75IN 3/32IN EDGE STRL .2IN DISP INSL STD SHAFT HEX LOCK LF (CAUTERY SUPPLIES) ×1 IMPLANT
ELECTRODE ESURG BLADE STD 2.75_IN 3/32IN STRL EDGE .2IN DISP (CAUTERY SUPPLIES) ×1
ELECTRODE PATIENT RTN 9FT VLAB C30- LB RM PHSV ACRL FOAM CORD NONIRRITATE NONSENSITIZE ADH STRP (CAUTERY SUPPLIES) ×1 IMPLANT
ELECTRODE PATIENT RTN 9FT VLAB_REM C30- LB PLHSV ACRL FOAM (CAUTERY SUPPLIES) ×1
FORCEP ENT 110005D 5EA/BX (INSTRUMENTS) ×1
FORCEP ENT 110005D 5EA/BX (SURGICAL INSTRUMENTS) ×1 IMPLANT
GARMENT COMPRESS MED CALF CENTAURA NYL VASOGRAD LTWT BRTHBL SEQ FIL BLU 18- IN (ORTHOPEDICS (NOT IMPLANTS)) ×1 IMPLANT
GARMENT COMPRESS MED CALF CENT_AURA NYL VASOGRAD LTWT BRTHBL (ORTHOPEDICS (NOT IMPLANTS)) ×2
HANDLE RIGID PLASTIC STRL LF  DISP DVN EZ HNDL SURG LIGHT (INSTRUMENTS) ×1
HANDLE RIGID PLASTIC STRL LF_DISP DVN EZ HNDL SURG LIGHT (INSTRUMENTS) ×1
HOOK 5MM RETRACT BLUNT ELAS STAY DISP (ENDOSCOPIC SUPPLIES) ×3 IMPLANT
HOOK 5MM RETRACT BLUNT ELAS ST_AY DISP (INSTRUMENTS ENDOMECHANICAL) ×3
HOOK RETRACTR 14X16MM LONESTAR SM CURVE BLUNT 2 FINGER ELAS STAY RAKE SLF RTN STRL LF  DISP (ENDOSCOPIC SUPPLIES) ×1 IMPLANT
HOOK RETRACTR 14X16MM LONESTAR_SM CURVE BLUNT 2 FINGER ELAS (INSTRUMENTS ENDOMECHANICAL) ×1
KIT RM TURNOVER CLEANOP CSTM INFCT CONTROL (KITS & TRAYS (DISPOSABLE)) ×1
KIT RM TURNOVER CLEANOP CSTM I_NFCT CONTROL (KITS & TRAYS (DISPOSABLE)) ×1
KIT RM TURNOVER CLEANOP CUSTOM INFCT CONTROL (KITS & TRAYS (DISPOSABLE)) ×1 IMPLANT
LABEL E-Z STICK_STLEZP1 100EA/CS (LABELS/CHART SUPPLIES) ×1
LABEL MED EZ PEEL MRKR LF (LABELS/CHART SUPPLIES) ×1 IMPLANT
MBO USE ITEM 317672 - HANDLE RIGID PLASTIC STRL LF  DISP DVN EZ HNDL SURG LIGHT (SURGICAL INSTRUMENTS) ×1 IMPLANT
NEEDLE HYPO  27GA 1.5IN STD MONOJECT SS POLYPROP REG BVL LL (NEEDLES & SYRINGE SUPPLIES) ×1
PACK CUSTOM HEAD AND NECK (CUSTOM TRAYS & PACK) ×1
PAD ARMBOARD FOAM BLU_FP-ECARM (POSITIONING PRODUCTS) ×4
PAD ARMBRD BLU (POSITIONING PRODUCTS) ×2 IMPLANT
RESERVOIR DRAIN SIL JP BULB 100CC STRL LF  DISP (WOUND CARE SUPPLY) ×1 IMPLANT
RESERVOIR DRAIN SIL JP BULB 10_0CC STRL LF DISP (WOUND CARE/ENTEROSTOMAL SUPPLY) ×1
SHEARS ESURG 9CM HARMON FOCUS+ CURVE TAPER 2 HNDCNTL SFT GRIP STRL DISP (ENDOSCOPIC SUPPLIES) IMPLANT
SHEARS ESURG 9CM HARMON FOCUS_CURVE SCISSOR GRIP BLADE REPRO (INSTRUMENTS ENDOMECHANICAL)
SPONGE SUPER GAUZE 6 X 6 3/4IN 7310 10EA/TU 48TU/CS (WOUND CARE SUPPLY) ×1 IMPLANT
SPONGE SUPER GAUZE 6 X 6 3/4IN 7310 10EA/TU 48TU/CS (WOUND CARE/ENTEROSTOMAL SUPPLY) ×1
SUTURE 3-0 PC5 ETHILON MTPS 18_IN BLK MONOF NONAB (SUTURE/WOUND CLOSURE)
SUTURE 3-0 SH-1 VICRYL 18IN VI_OL CR BRD 8 STRN COAT ABS (SUTURE/WOUND CLOSURE)
SUTURE 6-0 C-1 PROLENE VISI-BLK 30IN BLU 2 ARM MONOF NONAB (SUTURE/WOUND CLOSURE) IMPLANT
SUTURE 6-0 C-1 PROLENE VISI-BL_K 30IN BLU 2 ARM MONOF NONAB (SUTURE/WOUND CLOSURE)
SUTURE SILK 2-0 FS PERMAHAND 18IN BLK BRD NONAB (SUTURE/WOUND CLOSURE) IMPLANT
SUTURE SILK 2-0 FS PERMAHAND 1_8IN BLK BRD NONAB (SUTURE/WOUND CLOSURE)
SUTURE SILK 2-0 PERMAHAND 18IN BLK BRD TIE 12 STRN PCUT NONAB (SUTURE/WOUND CLOSURE) IMPLANT
SUTURE SILK 2-0 PERMAHAND 18IN_BLK BRD TIE 12 STRN PCUT (SUTURE/WOUND CLOSURE)
SUTURE SILK 2-0 SH PERMAHAND 30IN BLK BRD NONAB (SUTURE/WOUND CLOSURE) IMPLANT
SUTURE SILK 2-0 SH PERMAHAND 3_0IN BLK BRD NONAB (SUTURE/WOUND CLOSURE)
SYRINGE BD LL 10ML LF STRL CO_NTROL CONCEN TIP PRGN FREE (NEEDLES & SYRINGE SUPPLIES) ×1
SYRINGE LL 10ML LF  STRL CONTROL CONCEN TIP PRGN FREE DEHP-FR MED DISP (NEEDLES & SYRINGE SUPPLIES) ×1 IMPLANT
TRAY CATH 16FR LUBRISIL CMP CR 2 CONN ADV FOLEY SAF FLOW URMTR SILVER LF (Drains/Resovoirs) ×1 IMPLANT
TRAY CATH 16FR LUBRISIL CMP CR_2 CONN ADV FOLEY SAF FLOW (Drains/Resovoirs) ×1
TRAY SKIN SCRUB 8IN VNYL COTTON 6 WNG 6 SPONGE STICK 2 TIP APPL DRY STRL LF (KITS & TRAYS (DISPOSABLE)) ×1 IMPLANT
TRAY SURG PREP SCR CR ESTM (KITS & TRAYS (DISPOSABLE)) ×1

## 2015-02-06 NOTE — Brief Op Note (Addendum)
[facility]                                                     BRIEF OPERATIVE NOTE    [name]  Hospital Number: [id number]  Date of Service: 02/06/2015   Date of Birth: 01/19/1956    All elements must be documented.    Pre-Operative Diagnosis: right parotid mass   Post-Operative Diagnosis: same  Procedure(s)/Description:  Excision of right parotid mass via superficial parotidectomy with facial nerve dissection and use of facial nerve monitoring  Findings/Complexity (inherent to the procedure performed): approximately 2 cm mass over pes anserina; all branches of facial nerve stimulated at 1 mAmp      Attending Surgeon: Berton Mountusha Lyell Clugston, MD  Assistant(s): Sharlynn Oliphant. Eric Bailey, MD; Ronnald RampAlex Limjuco, MD    Anesthesia Type: General endotracheal anesthesia  Estimated Blood Loss:  less than 50 ml  Blood Given: none  Fluids Given: 1200 cc crystalloid  Complications (unintended/unexpected/iatrogenic/accidental/inadvertent events):  None apparent  Wound Class: Clean Contaminated Wounds -Respiratory, GI, Genital, or Urinary    Tubes: None  Drains: Teresita MaduraJackson Pratt Drain  Specimens/ Cultures: right parotid mass  Implants: none           Disposition: PACU - hemodynamically stable.  Condition: stable    Merrily Brittlehristopher Eric Bailey, MD 02/06/2015, 17:25                      I saw and examined the patient.  I reviewed the resident's note.  I agree with the findings and plan of care as documented in the resident's note.  Any exceptions/additions are edited/noted.    Berton Mountusha Blaze Nylund, MD 02/10/2015, 06:58

## 2015-02-06 NOTE — Anesthesia Preprocedure Evaluation (Addendum)
Physical Exam:     Airway       Mallampati: III    TM distance: >3 FB    Neck ROM: full  Mouth Opening: good.      No endotracheal tube present  No Tracheostomy present    Dental           (+) upper dentures, poor dentition             Pulmonary    Breath sounds clear to auscultation  (-) no rhonchi, no decreased breath sounds, no wheezes, no rales and no stridor     Cardiovascular    Rhythm: regular  Rate: Normal  (-) no friction rub, carotid bruit is not present, no peripheral edema and no murmur     Other findings            Anesthesia Plan:  Planned anesthesia type: general  ASA 3     Intravenous induction   Patient's NPO status is appropriate for Anesthesia.    Anesthetic plan and risks discussed with patient.    Anesthesia issues/risks discussed are: PONV.    Use of blood products discussed with patient whom.     Plan discussed with CRNA.                    EKG Ordered: 02/06/2015  CXR Ordered:  02/06/2015  Other Studies: labs in PAU  Consults: None    Cardiac cath 10/2012  1. Normal left ventricular function. (EF 6-65%)  2. Mild coronary artery disease.

## 2015-02-06 NOTE — Anesthesia Postprocedure Evaluation (Signed)
ANESTHESIA POSTOP EVALUATION NOTE        Anesthesia Service      [facility]     02/06/2015     Last Vitals: Temperature: 36.2 C (97.2 F) (02/06/15 1830)  Heart Rate: 80 (02/06/15 1900)  BP (Non-Invasive): (!) 112/57 mmHg (02/06/15 1900)  Respiratory Rate: (!) 21 (02/06/15 1900)  SpO2-1: 96 % (02/06/15 1900)  Pain Score (Numeric, Faces): 5 (02/06/15 1900)    Procedure(s):  PAROTIDECTOMY SUPERFICIAL WITH FACIAL NERVE DISSECTION    Patient is sufficiently recovered from the effects of anesthesia to participate in the evaluation and has returned to their pre-procedure level.  I have reviewed and evaluated the following:  Respiratory Function: Consistent with pre anesthetic level  Cardiovascular Function: Consistent with pre anesthetic level  Mental Status: Return to pre anesthetic baseline level  Pain: Sufficiently controlled with medication  Nausea and Vomiting: Absent or sufficiently controlled with medication  Post-op Anesthetic Complications: None    Comment/ re-evaluation for any variations: None

## 2015-02-06 NOTE — H&P (Addendum)
[facility]  OTOLARYNGOLOGY DEPARTMENT  Pre-Operative H&P     Date: 02/06/2015  Name: [name], 59 y.o. female  MRN: [id number]  DOB: 1956/03/15      Pre-Surgical H & P   1.  See H&P from 01/01/15 by Dr. Posey Pronto for further details.  H&P assessment updated as follows:    Chief Complaint: right parotid mass    History of Present Illness: Latausha Flamm is a 59 y.o. female with a right parotid mass.  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  She presents today for right parotidectomy.  She denies changes in her health or new medications since clinic visit.        Past Medical History/Surgical History/Family History/Social History = No changes.  Change in Medications: No  Allergies   Allergen Reactions    Cymbalta [Duloxetine]      Rash on feet and hands    Blue Dye      Told to avoid due to patch test result by Simone Curia    Lipitor [Atorvastatin]      Muscle pain    Mobic [Meloxicam] Nausea/ Vomiting    Nickel     Naprosyn [Naproxen] Nausea/ Vomiting   Review of Systems: no fever, chills, SOB, wheezing; + for facial weakness, eye pain, ear pain. Other ROS negative    Physical Examination: No acute distress.  No respiratory distress no stridor.  No cyanosis of the upper extremities.  No epistaxis.  No otorrhea.  Moves all extremities.    Assessment: Juneau Doughman is a 59 y.o. female with right parotid mass      Plan: to OR with Dr. Posey Pronto for right parotidectomy     2.  Patient continues to be appropiate candidate for planned surgical procedure. YES      Levonne Lapping, MD, PhD 02/06/2015, 13:04  Resident PGY-5  [facility]      I saw and examined the patient.  I reviewed the resident's note.  I agree with the findings and plan of care as documented in the resident's note.  Any exceptions/additions are edited/noted Patient with right BMT of parotid. Risks of surgery including FN weakness, infection, seroma discussed.  Patient and family understood and wished to proceed.     Delfino Lovett, MD 02/10/2015, 07:14

## 2015-02-06 NOTE — H&P (Deleted)
[facility]  OTOLARYNGOLOGY DEPARTMENT  H&P    Date: 02/06/2015  Name: [name], 59 y.o. female  MRN: [id number]  DOB: February 07, 1956    Chief Complaint: Right parotid mass    History of Present Illness:  [name] is a 59 y.o. female with a right parotid mass.  Biopsy showed pleomorphic adenoma.  She is here for right parotidectomy.    Past Medical History   Diagnosis Date    Multinodular goiter     Headache(784.0)      Migraines    Arthritis     Vaginal prolapse 2012     surgery improved significantly    Rheumatic fever 16     no complications    Depression     GERD (gastroesophageal reflux disease)      controlled w/med    Wears glasses     Wears dentures     Hyperlipidemia LDL goal < 100 08/29/2013    Heart murmur 1979     benign    MINOR CAD (coronary artery disease) 11/10/2012     no treatment other than cholesterol medications; follows regularly with Dr. Rodney Booze    Squamous cell carcinoma 02/06/2015         Past Surgical History   Procedure Laterality Date    Pb upper gi endoscopy,biopsy  12/19/07     patulous GE junction zone, erythema, nonerosive GERD    Hx ankle fracture tx  2007     left distal fibula, casted    Hx wrist fracture tx  2007     [place] injury    Hx total vaginal hysterectomy  1979    Colonoscopy  01/06/09     COLONOSCOPY performed by Loletta Parish F at [place]    Hx cystocele repair  09/24/09    Hx gall bladder surgery/chole      Hx adenoidectomy      Hx tonsillectomy      Gastroscopy  03/05/2010     GASTROSCOPY performed by Jocelyn Lamer at [place] with biopsy  03/05/2010     GASTROSCOPY WITH BIOPSY performed by Jocelyn Lamer at [place]    Pb revise ulnar nerve at elbow  1979    Hx appendectomy      Hx colonoscopy      Hx hand surgery  2012     for Carpal tunnel     Hx oophorectomy       left ovary removed         Family History   Problem Relation Age of Onset    Congestive Heart Failure Father 23    died at 3    Coronary Artery Disease Father     Heart Attack Father     Heart Attack Sister      33    Hypertension Brother     Hypertension Sister     Hypertension Sister     Kidney Disease Sister      ESRD    Stroke Paternal Grandmother     Diabetes Mother     High Cholesterol Mother     Hypertension Mother     Coronary Artery Disease Mother 3     CABG    Thyroid Disease Sister     Bipolar Disorder Daughter     Cancer Paternal Aunt      Breast Ca    Cancer Paternal Aunt      Breast Ca  Cancer Paternal Aunt      Breast Ca    Cancer Paternal Aunt      Breast Ca    Cancer Paternal Aunt      Breast Ca    Leukemia Paternal Uncle     Cancer Other 29     Breast Ca/Breast Ca    Leukemia Paternal Uncle          History   Sexual Activity    Sexual Activity: Not on file     History     Social History    Marital Status: Divorced     Spouse Name: N/A    Number of Children: N/A    Years of Education: N/A     Occupational History    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 Employed     occasionally     Social History Main Topics    Smoking status: Former Smoker -- 2.00 packs/day for 40 years     Types: Cigarettes     Quit date: 01/22/2005    Smokeless tobacco: Never Used    Alcohol Use: No    Drug Use: No    Sexual Activity: Not on file     Other Topics Concern    Uses Cane No    Uses Walker No    Uses Wheelchair No    Right Hand Dominant Yes    Left Hand Dominant No    Ambidextrous No    Ability To Walk 1 Flight Of Steps Without Sob/Cp Yes    Routine Exercise Yes     walk    Ability To Walk 2 Flight Of Steps Without Sob/Cp Yes    Ability To Do Own Adl's Yes    Shift Work No    Unusual Sleep-Wake Schedule No     Social History Narrative     Allergy History as of 02/06/15     CODEINE       Noted Status Severity Type Reaction    09/19/13 1430 Thurston Pounds, RN  Deleted Low  Itching    Comments:  RASH     09/04/09 Bruna Potter, FNP  Active   Itching    Comments:  RASH     10/10/07   Active          Comments:  RASH           STRAWBERRY       Noted Status Severity Type Reaction    03/18/11 1634 Johnnette Barrios  Deleted       10/10/08 [name], MD  Deleted       10/10/07   Active             MELOXICAM       Noted Status Severity Type Reaction    06/26/09 Corky Sing Nacole 06/26/09 Active   Nausea/ Vomiting          NICKEL       Noted Status Severity Type Reaction    07/24/12 1041 [name], MD 07/24/12 Active  Topical           BLUE DYE       Noted Status Severity Type Reaction    07/24/12 1041 [name], MD 07/24/12 Active  Topical     Comments:  Told to avoid due to patch test result by Derm            DULOXETINE       Noted Status Severity Type Reaction    07/24/12 1052  [name], MD 07/24/12 Active Medium Systemic     Comments:  Rash on feet and hands           NAPROXEN       Noted Status Severity Type Reaction    11/01/12 0913 Sanjuana Letters, MA 11/01/12 Active Low  Nausea/ Vomiting          ATORVASTATIN       Noted Status Severity Type Reaction    01/27/15 1035 [name], MD 01/27/15 Active  Side Effect     Comments:  Muscle pain                 Current facility-administered medications:     bacitracin 500 units/gram topical ointment tube, , Apply Topically, INTRA-OP Once PRN, Alois Cliche, MD    dexamethasone 4 mg/mL injection, 4 mg, Intravenous, INTRA-OP Once, [place], Michaell Cowing, MD    HYDROmorphone (DILAUDID) 1 mg/mL injection, 0.2 mg, Intravenous, PACU Q10 Min PRN, Delrae Sawyers, MD    HYDROmorphone (DILAUDID) 1 mg/mL injection, 0.4 mg, Intravenous, PACU Q10 Min PRN, Delrae Sawyers, MD    lidocaine 1%-EPINEPHrine 1:100,000 injection, 5 mL, Injection, INTRA-OP Once PRN, Alois Cliche, MD    LR premix infusion, , Intravenous, Continuous, [place], Luiz Ochoa, MD    metoclopramide HCl (REGLAN) 20 mg in NS 50 mL IVPB, 20 mg, Intravenous, PACU Once PRN, [place], Michaell Cowing, MD    INSERT & MAINTAIN  PERIPHERAL IV ACCESS, , , UNTIL DISCONTINUED **AND** DRESSING CHANGE Saline Lock; DRY STERILE, , , PRN **AND** NS flush syringe, 2 mL, Intracatheter, Q8HRS **AND** NS flush syringe, 2-6 mL, Intracatheter, Q1 MIN PRN, Simonne Martinet, Luiz Ochoa, MD    ondansetron (ZOFRAN) 2 mg/mL injection, 4 mg, Intravenous, INTRA-OP Once, [place], Michaell Cowing, MD    prochlorperazine (COMPAZINE) 5 mg/mL injection, 5 mg, Intravenous, PACU Once PRN, Delrae Sawyers, MD    promethazine (PHENERGAN) 12.5 mg in NS 25 mL IVPB, 12.5 mg, Intravenous, PACU Once PRN, Delrae Sawyers, MD    scopolamine (TRANSDERM SCOP) 1.5 mg transdermal patch, 1.5 mg, Transdermal, PRE-OP Once, Delrae Sawyers, MD    Review of Systems: Denies fevers or chills, headaches, chest pain, SOB, nausea, vomiting.  All other systems reviewed and found to be negative.    Physical Examination:     No acute distress  Head normocephalic  Face symmetric  Eyes clear  Pinnae normal shape and position  External pyramid midline  No concerns of lips  No cyanosis or edema  Unlabored breathing    Assessment:  [name] is a 59 y.o. female with right parotid mass.    Plan: To OR for right parotidectomy.    2.  Patient continues to be appropiate candidate for planned surgical procedure. YES      Barbara Burnet, MD 02/06/2015, 13:05

## 2015-02-06 NOTE — Anesthesia Transfer of Care (Cosign Needed)
[facility]  Anesthesia Transfer of Care Note    Last vitals: see doc flowsheet  Hemodynamically stable without acute distress on arrival to PACU and transfer of care    AWAKE AND ALERT, NO EVIDENCE OF AIRWAY OBSTRUCTION, DENIES PAIN AT THIS TIME    REPORT GIVEN TO PACU RN. ALL QUESTIONS/CONCERNS REGARDING PREOPERATIVE/INTRAOPERATIVE CARE WERE ADDRESSED. POST-OPERATIVE ORDERS PLACED.       Stacie Glaze, MD  02/06/2015, 17:26  Attending Physician  Department of Anesthesiology  [place]

## 2015-02-07 DIAGNOSIS — H02203 Unspecified lagophthalmos right eye, unspecified eyelid: Secondary | ICD-10-CM

## 2015-02-07 LAB — HISTORICAL SURGICAL PATHOLOGY SPECIMEN

## 2015-02-07 MED ORDER — HYDROMORPHONE 1 MG/ML INJECTION WRAPPER
0.6000 mg | INJECTION | Freq: Once | INTRAMUSCULAR | Status: AC
Start: 2015-02-08 — End: 2015-02-07
  Administered 2015-02-07: 0.6 mg via INTRAVENOUS
  Filled 2015-02-07: qty 1

## 2015-02-07 MED ORDER — HYDROCODONE 10 MG-ACETAMINOPHEN 325 MG TABLET
1.00 | ORAL_TABLET | ORAL | Status: DC | PRN
Start: 2015-02-07 — End: 2015-03-25

## 2015-02-07 MED ADMIN — nicotine 14 mg/24 hr daily transdermal patch: INTRAVENOUS | @ 03:00:00

## 2015-02-07 MED ADMIN — sodium chloride 0.9 % intravenous solution: @ 15:00:00 | NDC 00338004904

## 2015-02-07 MED ADMIN — sodium chloride 0.9 % intravenous solution: ORAL | @ 01:00:00 | NDC 00338004904

## 2015-02-07 NOTE — Pharmacy (Addendum)
[facility]  Discharge Pharmacy Service  Initial Note    Patient: Barbara, Gardner  Date of Birth: Mar 05, 1956  MRN: [id number]  Room/Bed: 80/A  Service: OTOLARYNGOLOGY    Date of Service: 02/07/2015    Allergies   Allergen Reactions    Cymbalta [Duloxetine]      Rash on feet and hands    Blue Dye      Told to avoid due to patch test result by Simone Curia    Lipitor [Atorvastatin]      Muscle pain    Mobic [Meloxicam] Nausea/ Vomiting    Nickel     Naprosyn [Naproxen] Nausea/ Vomiting       Reason for Visit:    Initiate Discharge Pharmacy Services:  Accepted  Freedom of Choice Offerings: Accepted  Review of Patient's Medication List: Accepted  Initiate Counseling Offer:  yes    Conclusion:     Consulted patient about the Discharge Pharmacy Service and patient accepted. Please contact Discharge Pharmacy when discharge orders are made and prescriptions are written at [phone] or fax prescriptions or [phone]. (If orders change or are discontinued please contact discharge Pharmacy immediately.)     Lance Sell, RX STUDENT 02/07/2015, 10:59

## 2015-02-07 NOTE — Care Plan (Signed)
Problem: General Plan of Care(Adult,OB)  Goal: Plan of Care Review(Adult,OB)  The patient and/or their representative will communicate an understanding of their plan of care   Outcome: Ongoing (see interventions/notes)  Reviewed plan of care pt has prn pain medication, did incisiona; care to neck pt has jp also call light within  reach    Problem: Pain, Acute (Adult)  Goal: Identify Related Risk Factors and Signs and Symptoms  Related risk factors and signs and symptoms are identified upon initiation of Human Response Clinical Practice Guideline (CPG)   Outcome: Ongoing (see interventions/notes)  Goal: Acceptable Pain Control/Comfort Level  Patient will demonstrate the desired outcomes by discharge/transition of care.   Outcome: Ongoing (see interventions/notes)

## 2015-02-07 NOTE — Progress Notes (Signed)
[facility] OF OTOLARYNGOLOGY - HEAD AND NECK SURGERY  INPATIENT PROGRESS NOTE             Current Date: 02/07/2015, 06:22  Name: [name], 59 y.o. female  MRN: [id number]  DOB: 02-Dec-1955  Date of Admission: 02/06/2015    SUBJECTIVE:   NAEO. Resting comfortably. Tolerating regular diet. OOB.     OBJECTIVE:   Vitals:   Temp (24hrs) Max:37.2 C (99 F)  Temperature: 36.5 C (97.7 F)  BP (Non-Invasive): (!) 94/56 mmHg  MAP (Non-Invasive): 96 mmHG  Heart Rate: 74  Respiratory Rate: 18  SpO2-1: 97 %   Today's Physical Exam:   General Appearance: pleasant, cooperative, no distress   Eyes: Conjunctiva clear., Pupils equal and round.    Head and Face: Facies symmetric at rest with slight marginal right-sided weakness (HB#2), no obvious lesions. Incision c/d/i without any erythema, bleeding, discharge, or strikethrough    Oral Cavity/Oropharynx: No mucosal lesions, masses, or pharyngeal asymmetry.   Neck: Flat, drain s/s (JP 16/NR)   Neurologic: grossly normal    I/O:  I/O: Last 24 hours  04/14 0000 - 04/14 2359  In: 4975 [P.O.:480; I.V.:1300]  Out: 116 [Urine:80; Drains:16; Blood:20]    I/O: Last Shift       Labs/Imaging/Studies:  Lab Results   Component Value Date    WBC 7.0 01/23/2015    HGB 14.0 01/23/2015    HCT 42.2 01/23/2015    PLTCNT 392 01/23/2015    PMNS 54 11/28/2014       No results for input(s): SODIUM, POTASSIUM, CHLORIDE, BICARBONATE, BUN, CREATININE, GLUCOSE, CALCIUM, MAGNESIUM, PHOSPHORUS in the last 72 hours.     ASSESSMENT & PLAN:  [name] is a 59 y.o. female,  LOS: 1 day  who is 1 Day Post-Op s/p parotidectomy with facial nerve dissection.    Plan:   - Wound site healing appropriately  - Regular diet  - Drain management  - OOB  - Dispo: likely D/C later today    Annett Gula, MD 02/07/2015 06:22

## 2015-02-07 NOTE — Nurses Notes (Signed)
i agree with the assesement

## 2015-02-07 NOTE — Nurses Notes (Signed)
PRN Norco administered for neck pain. Will continue to monitor.

## 2015-02-07 NOTE — Nurses Notes (Signed)
PRN Morphine administered for neck pain. Bacitracin applied to incision. Pt reported temporary right mouth numbness and says that she 'cannot really control that side of her mouth too well.' MD notified, called back to say would see patient at the first available time. Will continue to monitor.

## 2015-02-07 NOTE — Nurses Notes (Signed)
Pt arrived from OR. Oriented to room and unit. Pt reports only mild pain; currently declines intervention. Call light in reach. Will continue to monitor.

## 2015-02-07 NOTE — Care Management Notes (Signed)
[facility] Management Initial Evaluation    Patient Name: [name]  Date of Birth: 1956-10-19  Sex: female  Date/Time of Admission: 02/06/2015 10:11 AM  Room/Bed: 827/A  Payor: [place] / Plan: [place] MEDICAID / Product Type: Medicaid /   PCP: Barbara Barrier, MD    Pharmacy Info:   Preferred Pharmacy     [id number] [place] [place]   [id number]   Phone:: [phone]:: [phone]   Open 24 Hours?: No        Emergency Contact Info:   Extended Emergency Contact Information  Primary Emergency Contact: DLindwood Gardner Address: [address]          [place] [address]: [phone] Work Phone: [phone] Mobile Phone: [phone] Relation: Daughter    History:   SSamanthan Dugois a 59y.o., female, admitted right parotid mass    Height/Weight: 165.1 cm (5' 5") / 106.3 kg (234 lb 5.6 oz)     LOS: 1 day   Admitting Diagnosis: Squamous cell carcinoma [C80.1]    Assessment:      02/07/15 0945   Assessment Details   Assessment Type Admission   Date of Care Management Update 02/07/15   Date of Next DCP Update 02/10/15   Care Management Plan   Discharge Planning Status initial meeting   Projected Discharge Date 02/08/15   CM will evaluate for rehabilitation potential yes   Discharge Needs Assessment   Equipment Currently Used at Home none   Equipment Needed After Discharge none   Discharge Facility/Level Of Care Needs Home (Patient/Family Member/other)(code 1)   Referral Information   Admission Type inpatient   Address Verified verified-no changes   Arrived From admitted as an inpatient   Insurance Verified verified-no change   ADVANCE DIRECTIVES   Does the Patient have an Advance Directive? No, Information Offered and Refused   Employment/Financial   Patient has Prescription Coverage?  Yes       Name of Insurance Coverage for Medications medicaid   Financial Concerns none    Living Environment   Select an age group to open "lives with" row.  Adult   Lives With alone   Living Arrangements apartment   Able to Return to Prior Living Arrangements yes   Home Safety   Home Assessment: No Problems Identified   Home Accessibility no concerns   59yr old admitted with right parotid mass removal. Met with Pt at bedside. Introduced self and educated per role of care management at hospital and [place] Reviewed demographics and no changes noted. Pt has a positive out look and open to communication.     Discharge Plan:  Home (Patient/Family Member/other) (code 1)  Pt to d/c to home when medically cleared. Pt is going to stay with her daughter for a few days to get back on her feet. Family will transport. Will follow and assist with any discharge needs as identified.     The patient will continue to be evaluated for developing discharge needs.     Case Manager: CGeorgiann Gardner SW 02/07/2015, 11:43  Phone: [phone]

## 2015-02-07 NOTE — Care Plan (Signed)
Problem: General Plan of Care(Adult,OB)  Goal: Plan of Care Review(Adult,OB)  The patient and/or their representative will communicate an understanding of their plan of care   Outcome: Ongoing (see interventions/notes)  59 yr old admitted with right parotid mass removal. Met with Pt at bedside. Introduced self and educated per role of care management at hospital and in [place]. Reviewed demographics and no changes noted. Pt has a positive out look and open to communication.     Discharge Plan:  Home (Patient/Family Member/other) (code 1)  Pt to d/c to home when medically cleared. Pt is going to stay with her daughter for a few days to get back on her feet. Family will transport. Will follow and assist with any discharge needs as identified.

## 2015-02-07 NOTE — Nurses Notes (Signed)
PRN Morphine administered for neck pain. Will continue to monitor.

## 2015-02-07 NOTE — Progress Notes (Signed)
Ophthalmic Plastic and Reconstructive Surgery      Patient is sp BLL elevation with ear cartilage.  Had lagophthalmos OD post-op.  She is currently POD1 R parotidectomy.  She reports her vision is much clearer this morning.  No double vision.     Exam:     VA sc 20/20, 20/20   No RAPD   EOM full OU   Soft to palpation OU     Good frontalis tone, good orbicularis tone bilaterally  Asymmetric smile     LL lids in good place BL, trace lag OD   CS wq OU   K clear OU   AC dq OU   I rr OU     A/P:   POD 1 R parotidectomy sp Bilateral lower eyelid elevation with ear cartilage   - Frontal branch of facial nerve spared with good orbicularis strength   - trace lagophthalmos OD with no worsening post-parotidectomy   - continue lubrication with artificial tears and ointment   - will follow as outpatient as scheduled    Shelia Media, MD  02/07/2015, 22:09

## 2015-02-07 NOTE — Nurses Notes (Signed)
Assessment, admission, and vitals complete. PRN Morphine administered for neck pain. Pt tolerating solid PO foods, IVMF discontinued per MD to RN note. Will continue to montior.

## 2015-02-08 MED ORDER — OXYCODONE-ACETAMINOPHEN 5 MG-325 MG TABLET
2.00 | ORAL_TABLET | ORAL | Status: DC | PRN
Start: 2015-02-08 — End: 2015-03-25

## 2015-02-08 MED ORDER — OXYCODONE-ACETAMINOPHEN 5 MG-325 MG TABLET
2.0000 | ORAL_TABLET | ORAL | Status: DC | PRN
Start: 2015-02-08 — End: 2015-02-08
  Administered 2015-02-08 (×2): 2 via ORAL
  Filled 2015-02-08 (×2): qty 2

## 2015-02-08 MED ADMIN — sodium chloride 0.9 % (flush) injection syringe: ORAL | @ 17:00:00

## 2015-02-08 MED ADMIN — PANTOPRAZOLE 80MG IN NS 100ML CONTINUOUS INFUSION: ORAL | @ 09:00:00 | NDC 00008092355

## 2015-02-08 NOTE — Progress Notes (Addendum)
[facility] OF OTOLARYNGOLOGY - HEAD AND NECK SURGERY  INPATIENT PROGRESS NOTE             Current Date: 02/08/2015, 06:30  Name: [name], 59 y.o. female  MRN: [id number]  DOB: 1955-12-06  Date of Admission: 02/06/2015    SUBJECTIVE:  NAE. Pain not controlled.      OBJECTIVE:   Vitals:   Temp (24hrs) Max:36.9 C (98.4 F)  Temperature: 36.5 C (97.7 F)  BP (Non-Invasive): (!) 92/56 mmHg  MAP (Non-Invasive): 96 mmHG  Heart Rate: 69  Respiratory Rate: 18  SpO2-1: 95 %   Today's Physical Exam:   General Appearance: pleasant, cooperative, no distress   Eyes: Conjunctiva clear., Pupils equal and round.    Head and Face: Facies symmetric at rest with slight marginal right-sided weakness   Incision c/d/i without any erythema, bleeding, discharge, or strikethrough    Oral Cavity/Oropharynx: No mucosal lesions, masses, or pharyngeal asymmetry.   Neck: Flat, drain s/s JP 3.8/24.6   Neurologic: grossly normal    I/O:  I/O: Last 24 hours  04/15 0000 - 04/15 2359  In: 1791 [P.O.:1035; I.V.:11]  Out: 25.6 [Drains:24.6; Intra-Op Fluid:1]    I/O: Last Shift  04/16 0000 - 04/16 0759  In: 240 [P.O.:240]  Out: 503.8 [Urine:500; Drains:3.8]    Labs/Imaging/Studies:  Lab Results   Component Value Date    WBC 7.0 01/23/2015    HGB 14.0 01/23/2015    HCT 42.2 01/23/2015    PLTCNT 392 01/23/2015    PMNS 54 11/28/2014       No results for input(s): SODIUM, POTASSIUM, CHLORIDE, BICARBONATE, BUN, CREATININE, GLUCOSE, CALCIUM, MAGNESIUM, PHOSPHORUS in the last 72 hours.     ASSESSMENT & PLAN:  [name] is a 59 y.o. female,  LOS: 2 days  who is 2 Days Post-Op s/p parotidectomy with facial nerve dissection.    Plan:   - JP removed today  - Will try percocet instead of Norco if that controls pain will discharge later today.     Overton Mam, MD 06:31 02/08/2015  Otolaryngology-Head and Neck Surgery  PGY-3       I saw and examined the patient.  I reviewed the resident's note.  I agree with the findings  and plan of care as documented in the resident's note.  Any exceptions/additions are edited/noted.    Remi Haggard, MD 02/08/2015, 07:52

## 2015-02-08 NOTE — Discharge Summary (Signed)
DISCHARGE SUMMARY      PATIENT NAME:  [name], [name]  MRN:  [id number]  DOB:  1955-11-06    ADMISSION DATE:  02/06/2015  DISCHARGE DATE:  02/08/2015    ATTENDING PHYSICIAN: Delfino Lovett, MD  PRIMARY CARE PHYSICIAN: [name], MD     DISCHARGE DIAGNOSIS:   Principle Problem: <principal problem not specified>  Active Hospital Problems    Diagnosis Date Noted    Squamous cell carcinoma 02/06/2015      Resolved Hospital Problems    Diagnosis    No resolved problems to display.     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       Details    oxyCODONE-acetaminophen 5-325 mg Tablet   Commonly known as:  PERCOCET    2 Tabs, Oral, EVERY 4 HOURS PRN   Qty:  50 Tab   Refills:  0         CONTINUE these medications which have CHANGED during your visit.       Details    * HYDROcodone-acetaminophen 5-325 mg Tablet   Commonly known as:  Baker   What changed:  Another medication with  the same name was added. Make sure you understand how and when to take each.    1 Tab, Oral, EVERY 4 HOURS PRN   Qty:  17 Tab   Refills:  0       * HYDROcodone-acetaminophen 10-325 mg Tablet   Commonly known as:  NORCO   What changed:  You were already taking a medication with the same name, and this prescription was added. Make sure you understand how and when to take each.    1 Tab, Oral, EVERY 4 HOURS PRN   Qty:  35 Tab   Refills:  0       * Notice:  This list has 2 medication(s) that are the same as other medications prescribed for you. Read the directions carefully, and ask your doctor or other care provider to review them with you.      CONTINUE these medications - NO CHANGES were made during your visit.       Details  atenolol 25 mg Tablet   Commonly known as:  TENORMIN    25 mg, Oral, DAILY   Qty:  30 Tab   Refills:  11       buPROPion 200 mg Tablet Sustained Release   Commonly known as:  WELLBUTRIN SR    TAKE 1 TABLET BY MOUTH ONCE DAILY   Qty:  30 Tab   Refills:  1       Clobetasol 0.05 % Ointment   Commonly known as:  TEMOVATE    Topical, 2 TIMES DAILY, To rash on hands and feet   Qty:  60 g   Refills:  0       Fluorometholone 0.1 % Drops, Suspension   Commonly known as:  FML    1 Drop, Ophthalmic, 2 TIMES DAILY   Qty:  1 Bottle   Refills:  0       FLUoxetine 20 mg Tablet   Commonly known as:  PROZAC    TAKE 1 TABLET BY MOUTH ONCE A DAY (TABLET NECESSARY, ALLERGY TO DYE IN CAPSULE)   Qty:  30 Tab   Refills:  1       fluticasone 50 mcg/actuation Spray, Suspension   Commonly known as:  FLONASE    2 Sprays, Each Nostril, DAILY   Qty:  16 g   Refills:  11       gabapentin 300 mg Capsule   Commonly known as:  NEURONTIN    300 mg, Oral, 3 TIMES DAILY, For 7 days then take 2 Cap ( 600 mg) TID   Qty:  270 Cap   Refills:  5       Halobetasol Propionate 0.05 % Cream   Commonly known as:  ULTRAVATE    Apply  topically. Three times a week   Qty:  15 g   Refills:  2       Ibuprofen 400 mg Tablet   Commonly known as:   MOTRIN    400 mg, Oral, 3 TIMES DAILY PRN   Qty:  90 Tab   Refills:  2       Metronidazole 0.75 % Cream   Commonly known as:  METROCREAM    Topical, 2 TIMES DAILY   Qty:  45 g   Refills:  1       mineral oil-hydrophil petrolat Ointment   Commonly known as:  AQUAPHOR    Apply Topically, 4 TIMES DAILY PRN   Qty:  50 g   Refills:  0       nystatin 100,000 unit/gram Powder   Commonly known as:  NYSTOP    Apply once or twice daily to PREVENT rash under breasts   Qty:  60 g   Refills:  11       pantoprazole 20 mg Tablet, Delayed Release (E.C.)   Commonly known as:  PROTONIX    20 mg, Oral, EVERY MORNING BEFORE BREAKFAST   Qty:  30 Tab   Refills:  11       riboflavin (vitamin B2) 100 mg Tablet   Commonly known as:  VITAMIN B-2    400 mg, Oral, DAILY   Qty:  120 Tab   Refills:  11       tobramycin-dexamethasone 0.3-0.1 % Ointment   Commonly known as:  TOBRADEX    Both Eyes, 3 TIMES DAILY   Qty:  1 Tube   Refills:  1       triamcinolone acetonide 0.1 % Ointment   Commonly  known as:  ARISTOCORT A    Apply Topically, 2 TIMES DAILY   Qty:  80 g   Refills:  2           DISCHARGE INSTRUCTIONS:  Follow-up Information     Follow up with [facility], [place] .    Specialty:  Otolaryngology    Contact information:    [place]  [phone]    Additional information:    For driving directions to the Otolaryngology ENT Clinic, located within the [place], [place], [place],  please call [phone]) or you may visit our website at [email or web address]*Valet parking is available to patients at [facility] outpatient clinics for free and tipping is not required.*Visitors to our main campus will Location manager as we are expanding to better serve you. We apologize for any inconvenience this may cause and appreciate your patience.          SCHEDULE FOLLOW-UP ENT - PHYSICIAN OFFICE CENTER   Follow-up in: [place]    Reason for visit: POST-OP  VISIT    Followup reason: Right parotid    Provider: [place] COURSE:  This is a 59 y.o., female with a right parotid mass who presented on 02/06/2015 for a right parotidectomy. The surgery was successful without complication, and she was extubated post-operatively. On POD#1, she was OOB, tolerating a regular diet, and had a HB#2 on the right side. JP was removed at bedside on POD#2.     The patient was determined safe for discharge on 02/08/2015.  Prior to discharge the patient was tolerating a regular diet, voiding on her own, her pain was well controlled on oral pain medications, and she was ambulating well and Dr. Posey Pronto felt she was safe for discharge home.  The patient will follow up with Dr. Posey Pronto in 1 weeks for reevaluation.  All questions were answered prior to discharge and the patient agreed to discharge at this time.  Instructions on diet, activity, and wound care were given to them. The patient was instructed to follow up sooner for new or concerning symptoms.       CONDITION ON DISCHARGE:  A. Ambulation: Full ambulation  B. Self-care Ability: Complete  C. Cognitive Status Alert and Oriented x 3  D. DNR status at discharge: Full Code    DISCHARGE DISPOSITION:  Home discharge     Annett Gula, MD        Copies sent to Care Team       Relationship Specialty Notifications Start End    [name], MD PCP - General   01/09/07     Phone: [phone] Fax: [phone]         [address] [facility] [address]          Referring providers can utilize [email or web address] to access their referred [facility] patient's information.

## 2015-02-08 NOTE — Nurses Notes (Signed)
Discharge instructions given and explained to patient at this time. Prescription sent to discharge pharmacy per fax machine. Will deliver to patient shortly. Transportation on it's way at this time per patient. IV to left hand removed at this time. Catheter intact. Pt denies any questions/concerns regarding discharge instructions/medications at this time. Will continue to monitor. Call bell remains within reach.   Mike Gip, RN  02/08/2015, 17:05

## 2015-02-11 NOTE — OR Surgeon (Addendum)
[facility] OF OTOLARYNGOLOGY                                     OPERATION SUMMARY    PATIENT NAME: [name], [name] [facility] [id number]  DATE OF SERVICE:02/06/2015  DATE OF BIRTH: 09-28-1956    PREOPERATIVE DIAGNOSIS:  Right parotid mass.    POSTOPERATIVE DIAGNOSIS:  Right parotid mass.    NAME OF PROCEDURE:  Excision of right parotid mass via superficial parotidectomy and facial nerve dissection using facial nerve monitoring.    OPERATIVE FINDINGS:  Approximately 2-cm mass over the pes anserine; all branches of the facial nerve stimulated at 1 mAmp.    SURGEONS:   Delfino Lovett MD (attending), Levonne Lapping MD (assistant), Guss Bunde MD (assistant).    ANESTHESIA:  General endotracheal anesthesia.    ESTIMATED BLOOD LOSS:  Less than 50 mL.    BLOOD GIVEN:  None.    FLUIDS GIVEN:  1200 mL of crystalloid.    COMPLIATIONS:  None apparent.    WOUND CLASS:  Clean contaminated wounds.    TUBES:  None.    DRAINS:  Jackson-Pratt drain.    SPECIMENS:  Right parotid mass.    IMPLANTS:  None.    DISPOSITION:  PACU, hemodynamically stable.    CONDITION:  Stable.    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    DESCRIPTION OF PROCEDURE:  After the patient's interim history was reviewed in the preoperative area, she was taken to the OR by ENT and anesthesia.  After the patient was anesthetized and sensitized, a surgical pause was undertaken to verify the patient, name of procedure and operative site.  The head of the bed was then turned 180 degrees away from anesthesia.  Facial nerve monitoring was placed.  A modified Blair incision was marked with a surgical marking pen on the right side.  The patient was then prepped and draped in typical sterile fashion after the proposed incision was  infiltrated with 1% lidocaine with 1:100,000 epinephrine.  The procedure was initiated by incising through the skin with a 15 blade scalpel.  A plane of dissection was developed above the parotid fascia with a needle point Bovie.  This was connected to a subplatysmal plane in the neck portion of the incision the greater to her nerve was identified and the posterior branch was traced out and preserved.  The tail of the parotid was dissected off the sternocleidomastoid muscle.  The digastric muscle was then identified as this point so we can develop a plane between the tracheal cartilage and parotid gland.  Dissection was carried out on broad front with tonsils and bipolar cautery.  This dissection was carried down to the area of the tympanomastoid suture line.  Dissection proceeded incrementally until the main trunk facial nerve was identified.  At this point, we began to follow the main trunk of the facial nerve.  The mass was noted to be lying over the main trunk and the pes ansarina.  We traced the main trunk forward until the nerve began to branch.  The inferior branches were followed and  the tissue superficial to the nerve was freed from the nerve and retracted cranially with the mass.  We followed the successive branches of the nerve working from inferior to superior until the mass was freed from the superior most branch of the upper division facial nerve.  The mass was then separated from the mass and surround parotid tissue was then separated.  Bipolar cautery was used throughout to maintain hemostasis.  The facial nerve was then stimulated with good response on facial nerve monitoring.  The surgical bed was then irrigated with sterile saline, a drain was placed and the surgical site was closed with 3-0 Vicryl suture in the platysma and deep dermis.  The skin was closed with 5-0 Monocryl.  The patient was allowed to waken and taken to the postoperative care unit in hemodynamically stable condition.  Dr. Delfino Lovett was present for the key and critical portions of the procedure and immediately available the entire procedure.      Levonne Lapping, MD  Resident  [facility] Department of Otolaryngology    Delfino Lovett, MD  Assistant Professor  [facility] Department of Otolaryngology    [id number]; D: 02/11/2015 00:58:10; T: 02/11/2015 13:39:58         I was present for all key and/or critical portions of the case and immediately available at all times.      Delfino Lovett, MD 02/12/2015, 21:27

## 2015-02-14 ENCOUNTER — Ambulatory Visit: Payer: MEDICAID | Attending: Otolaryngology | Admitting: Otolaryngology

## 2015-02-14 VITALS — BP 128/84 | HR 56 | Temp 98.6°F | Ht 65.63 in | Wt 219.8 lb

## 2015-02-14 DIAGNOSIS — Z9889 Other specified postprocedural states: Secondary | ICD-10-CM | POA: Insufficient documentation

## 2015-02-14 DIAGNOSIS — Z09 Encounter for follow-up examination after completed treatment for conditions other than malignant neoplasm: Secondary | ICD-10-CM | POA: Insufficient documentation

## 2015-02-14 DIAGNOSIS — IMO0002 Reserved for concepts with insufficient information to code with codable children: Secondary | ICD-10-CM

## 2015-02-14 DIAGNOSIS — D110 Benign neoplasm of parotid gland: Secondary | ICD-10-CM | POA: Insufficient documentation

## 2015-02-14 DIAGNOSIS — D239 Other benign neoplasm of skin, unspecified: Secondary | ICD-10-CM

## 2015-02-15 NOTE — Progress Notes (Signed)
PATIENT NAME:  [name]  MRN:  [id number]  DOB:  [birth year]  DATE OF SERVICE: 02/14/2015    HPI:  [name] is a 59 y.o. 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 She has done well since surgery. She has a post operative lower division facial nerve paresis, which has been improving. She has had significant improvement in her previous symptoms of ear pain and pressure from the mass. The incision has been healing well.     Physical Exam:  Blood pressure 128/84, pulse 56, temperature 37 C (98.6 F), height 1.667 m (5' 5.63"), weight 99.7 kg (219 lb 12.8 oz).  Body mass index is 35.88 kg/(m^2).  General Appearance: Pleasant, cooperative, healthy, and in no acute distress.  Eyes: Conjunctivae/corneas clear, PERRLA, EOM's intact.  Head and Face: Normocephalic, atraumatic.  Face symmetric, no obvious lesions.   Ears: Normal shape and position. TM intact, translucent, midposition, middle ear aerated.  Nose:  External pyramid midline. Septum midline. Mucosa normal. No purulence, polyps, or crusts.   Oral Cavity/Oropharynx: No mucosal lesions, masses, or pharyngeal asymmetry.  Neck:  No palpable thyroid, salivary gland, or neck masses. Right parotid incision healing well with small amount of pre-auricular swelling.   Heme/Lymph:  No cervical adenopathy.  Cardiovascular:  Good perfusion of upper extremities.  No cyanosis of the hands or fingers.  Lungs: No apparent stridorous breathing. No acute distress.  Neurologic: Weakness in right buccal and marginal branches of FN, improving. Frontal and temporal branches intact.   Psychiatric:  Alert and oriented x 3.    Data Reviewed:  Pathology reviewed as below:  B. PAROTID GLAND, RIGHT, PAROTIDECTOMY:    - Pleomorphic adenoma (2.4 x 2.3 x 2.0 cm).    - Margins negative for tumor.    - Three lymph nodes, negative for tumor.    Assessment:  1. Pleomorphic adenoma               Plan:  Patient recovering well from excision of pleomorphic adenoma on the right with improvement in symptoms. She continues to have post-operative pain. We discussed decreasing her Norco prescription and that I would expect her to require Tylenol or Ibuprofen by this time next week. If she has continued or increasing pain she should come in to clinic for evaluation. Otherwise we will see her back in 4-6 weeks for post-operative follow-up.       Delfino Lovett, MD 02/15/2015, 17:06    PCP:  [name], MD  [address]  [place] [address]   REF:  [name], [place], [place] [address]

## 2015-03-05 ENCOUNTER — Ambulatory Visit (INDEPENDENT_AMBULATORY_CARE_PROVIDER_SITE_OTHER): Payer: Self-pay | Admitting: Otolaryngology

## 2015-03-05 NOTE — Telephone Encounter (Signed)
Patient had parotidectomy on 02/10/15.  She awoke today with pain in the bottom of her foot.  She wanted to know if this is from her surgery.  I advised her it is not related to surgery, and if pain persists to contact her PCP.

## 2015-03-10 ENCOUNTER — Ambulatory Visit
Admission: RE | Admit: 2015-03-10 | Discharge: 2015-03-10 | Disposition: A | Discharge status: Home / Self Care | Payer: MEDICAID | Source: Ambulatory Visit | Attending: OPHTHALMOLOGY | Admitting: OPHTHALMOLOGY

## 2015-03-10 ENCOUNTER — Encounter (INDEPENDENT_AMBULATORY_CARE_PROVIDER_SITE_OTHER): Payer: Self-pay | Admitting: OPHTHALMOLOGY

## 2015-03-10 ENCOUNTER — Ambulatory Visit (HOSPITAL_BASED_OUTPATIENT_CLINIC_OR_DEPARTMENT_OTHER): Payer: MEDICAID | Admitting: OPHTHALMOLOGY

## 2015-03-10 ENCOUNTER — Other Ambulatory Visit (HOSPITAL_COMMUNITY): Payer: Self-pay | Admitting: Psychiatry

## 2015-03-10 DIAGNOSIS — Z48810 Encounter for surgical aftercare following surgery on the sense organs: Secondary | ICD-10-CM | POA: Insufficient documentation

## 2015-03-10 DIAGNOSIS — Z09 Encounter for follow-up examination after completed treatment for conditions other than malignant neoplasm: Principal | ICD-10-CM

## 2015-03-10 NOTE — Progress Notes (Addendum)
[facility]  Operated by [facility]  [address]  [place] [address]  Dept: [facility] Note    03/10/2015  [name],    Date of Birth:  October 25, 1956    No referring provider defined for this encounter.    Ophthalmologist/Optometrist:     SURGERY  Procedure:  Diagnosis: No diagnosis found.  Anesthesia:   Length of time:  minutes  Special Needs:       The patient is a 59 y.o. female here with complaint of:  Chief Complaint     Post-op Eye        HPI     Post-op Eye   Laterality: both eyes   Pain scale: 0/10   Quality: blurred           Comments   pov Bilateral lower eyelid ectropion repair by lateral tarsal strip. Bilateral lower eyelid retraction repair by ear cartilage elevation. Harvest of cartilage, right ear 01/28/15. Stitches are still falling out-fall in ou sometimes.   va is blurry, since sx 02/07/15 with dr patel.       Last edited by Lloyd Huger, COT on 03/10/2015  1:43 PM. (History)            History of Sun Exposure: If yes:   Ptosis evaluation:     MD Addition to HPI:   Patient denies any pain - reports some blurry vision.  Has upcoming apt with Dr. Posey Pronto.     Impression and Plan:  1. POW 6 BLL elevation with ear cartilage   - doing well   - ear healing well   - lids in good position   - cornea improving, continue lubrication as discussed    [place] 8-9 months, sooner PRN       I have reviewed and confirmed the ROS, PFSH, and exam elements performed and documented by the technician. The scribed portion of the progress note was scribed on my behalf and at my direction. I have reviewed and attest to the accuracy of the note.   [name], MD 03/10/2015, 14:56    03/10/2015  I saw and examined the patient.  I reviewed the fellow's note.  I agree with the findings and plan of care as documented in the fellow's note.  Any exceptions/additions are edited/noted.    [name], MD 03/10/2015, 14:56     Past Ocular  history:  Bilateral lower eyelid ectropion repair by lateral tarsal strip. Bilateral lower eyelid retraction repair by ear cartilage elevation. Harvest of cartilage, right ear 01/28/15    ROS     Negative for: Constitutional, Gastrointestinal, Neurological, Skin, Genitourinary, Musculoskeletal, HENT, Endocrine, Cardiovascular, Eyes, Respiratory, Psychiatric, Allergic/Imm, Heme/Lymph    Last edited by Lloyd Huger, COT on 03/10/2015  1:32 PM. (History)            Eye Examination:     Neuro/Psych     Oriented x3:  Yes    Mood/Affect:  Normal        Visual Acuity     Visual Acuity (Snellen - Linear)      Right Left   Dist sc 20/60 20/50   Dist ph sc 20/30 20/40             Edited by: Lloyd Huger, COT             Not recorded         Not recorded  Not recorded         Not recorded        Pupils      APD   Right None   Left None            Not recorded           OD    OS                                                                Eyelids: BLL in good position     R Ear well healed - tender to touch but no e/o infection    Lid edema WI:OXBDZ OS: trace Conj chem OD: None HG:DJME   Lid inject OD: None OS: None Conj inject OD: None OS: None       8   4   PF   MRD       8   4             LF   LC                 0   0   USS   LSS       0   0       0   LAG       0       SLE  OD OS     Tear Assessment Above limbus Above limbus    Cornea Tr PEE Tr PEE     AC D&Q D&Q    Iris intact intact    Lens NS NS       IOP straight Tonometry     Tonometry (Tonopen, 1:44 PM)      Right Left   Pressure 14 12             Edited by: Kirstie Mirza A, COT                Upgaze     5 down        Fundus Dilated no    Last Dilated      OD OS   Disc     Macula     Vessels     Periphery            Past Medical / Surgical / Family / Social History:     Past Medical History   Diagnosis Date   . Multinodular goiter    . Headache(784.0)      Migraines   . Arthritis    . Vaginal prolapse 2012     surgery improved significantly   .  Rheumatic fever 17     no complications   . Depression    . GERD (gastroesophageal reflux disease)      controlled w/med   . Wears glasses    . Wears dentures    . Hyperlipidemia LDL goal < 100 08/29/2013   . Heart murmur 1979     benign   . MINOR CAD (coronary artery disease) 11/10/2012     no treatment other than cholesterol medications; follows regularly with Dr. Rodney Booze   . Squamous cell carcinoma 02/06/2015           Past Surgical History   Procedure  Laterality Date   . Pb upper gi endoscopy,biopsy  12/19/07     patulous GE junction zone, erythema, nonerosive GERD   . Hx ankle fracture tx  2007     left distal fibula, casted   . Hx wrist fracture tx  2007     [place] injury   . Hx total vaginal hysterectomy  1979   . Colonoscopy  01/06/09     COLONOSCOPY performed by Loletta Parish F at [facility]   . Hx cystocele repair  09/24/09   . Hx gall bladder surgery/chole     . Hx adenoidectomy     . Hx tonsillectomy     . Gastroscopy  03/05/2010     GASTROSCOPY performed by Jocelyn Lamer at [facility]   . Gastroscopy with biopsy  03/05/2010     GASTROSCOPY WITH BIOPSY performed by Jocelyn Lamer at [facility]   . Pb revise ulnar nerve at elbow  1979   . Hx appendectomy     . Hx colonoscopy     . Hx hand surgery  2012     for Carpal tunnel    . Hx oophorectomy       left ovary removed           Family History   Problem Relation Age of Onset   . Congestive Heart Failure Father 79     died at 48   . Coronary Artery Disease Father    . Heart Attack Father    . Heart Attack Sister      92   . Hypertension Brother    . Hypertension Sister    . Hypertension Sister    . Kidney Disease Sister      ESRD   . Stroke Paternal Grandmother    . Diabetes Mother    . High Cholesterol Mother    . Hypertension Mother    . Coronary Artery Disease Mother 33     CABG   . Thyroid Disease Sister    . Bipolar Disorder Daughter    . Cancer Paternal Aunt      Breast Ca   . Cancer Paternal Aunt      Breast Ca   . Cancer Paternal Aunt      Breast Ca   .  Cancer Paternal Aunt      Breast Ca   . Cancer Paternal Aunt      Breast Ca   . Leukemia Paternal Uncle    . Cancer Other 56     Breast Ca/Breast Ca   . Leukemia Paternal Uncle            History   Substance Use Topics   . Smoking status: Former Smoker -- 2.00 packs/day for 40 years     Types: Cigarettes     Quit date: 01/22/2005   . Smokeless tobacco: Never Used   . Alcohol Use: No       I have seen and examined the above patient. I discussed the above diagnoses listed in the assessment and the above ophthalmic plan of care with the patient and patient's family. All questions were answered. I reviewed and, when necessary, made changes to the technician/resident note, documented ophthalmology exam, chief complaint, history of present illness, allergies, review of systems, past medical, past surgical, family and social history.      [name], M.D.  03/10/2015, 13:46      "I scribed a portion of the encounter including Chief  Complaint, HPI, Impression, Plan, and exam elements excluding visual acuity, lensometry, pupil assessment, confrontational visual fields, tonometry results, extraocular motility assessment, refractometry results, and assessment of mood and orientation. I scribed this note at the request of the physician who personally performed the services documented."  Edwena Bunde, [place]  03/10/2015, 14:24

## 2015-03-25 ENCOUNTER — Ambulatory Visit: Payer: MEDICAID | Attending: PHYSICAL MEDICINE AND REHABILITATION | Admitting: PHYSICAL MEDICINE AND REHABILITATION

## 2015-03-25 ENCOUNTER — Encounter (INDEPENDENT_AMBULATORY_CARE_PROVIDER_SITE_OTHER): Payer: Self-pay | Admitting: PHYSICAL MEDICINE AND REHABILITATION

## 2015-03-25 VITALS — BP 121/77 | HR 72 | Ht 65.6 in | Wt 224.6 lb

## 2015-03-25 DIAGNOSIS — M5136 Other intervertebral disc degeneration, lumbar region: Secondary | ICD-10-CM | POA: Insufficient documentation

## 2015-03-25 DIAGNOSIS — M545 Low back pain: Secondary | ICD-10-CM | POA: Insufficient documentation

## 2015-03-25 DIAGNOSIS — M549 Dorsalgia, unspecified: Secondary | ICD-10-CM

## 2015-03-25 NOTE — Progress Notes (Signed)
To be dictated  Delray Alt, MD 03/25/2015, 09:15

## 2015-03-26 NOTE — H&P (Signed)
[place], [place] [address]                                PATIENT NAME: [name], [name] [facility] [id number]  DATE OF SERVICE:03/25/2015  DATE OF BIRTH: October 08, 1956    HISTORY AND PHYSICAL    REFERRING PHYSICIAN:  Edmonia Samra Pesch, MD.    SUBJECTIVE:  [name] is a 59 year old white female who has had on and off back problems for a number of years.  She relates the latest episode flared up about 3 months ago.  It occurs across the low back and has daily discomfort, 5-6 on a 1-10 scale, and will intermittently radiate down both posterior legs to her ankles.  She relates the pain down her legs about 2-3 on a 1-10 scale.  She relates pain in the legs is not daily.  It does seem to be aggravated by prolonged standing.  She has not had any recent physical therapy.  X-rays done recently showed some degenerative changes of facets, in particular L5-S1.  She relates bowel and bladder function is unchanged recently.  She does have chronic problems with her bowels and bladder, but no recent changes.  She has had urinary frequency, some stress incontinence at times, chronic constipation and diarrhea.    PAST MEDICAL HISTORY:  Heart disease, high cholesterol, depression, osteoarthritis of knees, patellofemoral syndrome, hyperlipidemia, coronary artery disease, which is stated as minor, right parotid cancer tumor and GERD.    PAST SURGICAL HISTORY:  Right parotidectomy, cystocele repair, vaginal hysterectomy, eye lid surgeries, tonsillectomy, appendectomy and renal insufficiency.    CURRENT MEDICATIONS:  Per Epic.    ALLERGIES:  Per Epic.    SOCIAL HISTORY:  Stay-at-home mother, last worked 2007.  No smoking, no alcohol use.    REVIEW OF SYSTEMS:  As above, but also has a history of bad knees, osteoarthritis of the knees, seasonal allergies.    FAMILY HISTORY:  Positive for  back problems in her father and high blood pressure.    PHYSICAL EXAMINATION:  On exam, her height is 5 feet 5 inches, weight 101 kg, blood pressure 124/77, pulse 72 and regular.  She is alert and oriented, pleasant and cooperative, no acute distress.  Lungs are clear.  Heart:  Regular rate and rhythm.  Neurologically, her strength in upper and lower extremities is 5/5, reflexes symmetric 1-2/4.  Back is tender across the paralumbar region, no muscle spasms.  Lower extremity:  Negative straight leg raising.  The left knee is in a hinged brace.  She has a well-healed right parotid area incision.    IMPRESSION:  Mechanical low back pain, lumbar DDD.  X-rays revealed some facet degenerative changes at L5-S1.    PLAN:  Will begin physical therapy to work on core strengthening and see her back in a few months.  Will consider lumbar spine MRI if symptoms persist.      Charlies Silvers, MD  Assistant Professor  [facility] Department of Orthopaedics    [id number]; D: 03/25/2015 10:25:15; T: 03/26/2015 05:35:31  cc: Kelton Pillar MD      [place], [place] [address]

## 2015-04-02 ENCOUNTER — Ambulatory Visit: Payer: MEDICAID | Attending: Otolaryngology | Admitting: Otolaryngology

## 2015-04-02 VITALS — BP 126/78 | HR 68 | Temp 97.5°F | Ht 65.0 in | Wt 222.7 lb

## 2015-04-02 DIAGNOSIS — IMO0002 Reserved for concepts with insufficient information to code with codable children: Secondary | ICD-10-CM

## 2015-04-02 DIAGNOSIS — R202 Paresthesia of skin: Secondary | ICD-10-CM | POA: Insufficient documentation

## 2015-04-02 DIAGNOSIS — D239 Other benign neoplasm of skin, unspecified: Secondary | ICD-10-CM | POA: Insufficient documentation

## 2015-04-02 NOTE — Progress Notes (Addendum)
PATIENT NAME:  [name]  MRN:  [id number]  DOB:  [birth year]  DATE OF SERVICE: 04/02/2015    HPI:  [name] is a 58 y.o. year old female for follow up of right pleomorphic adenoma.  She had the surgery in April.  She was been doing well since last follow up in April.  Only main complaint is that she has some tingling, numbness, and sometimes pain sensation over the skin of her neck and jaw.  It is getting better.  No issues with eating.  No symptoms of Frey's or first bite syndrome.    Physical Exam:  Blood pressure 126/78, pulse 68, temperature 36.4 C (97.5 F), temperature source Thermal Scan, height 1.651 m (5\' 5" ), weight 101 kg (222 lb 10.6 oz).  Body mass index is 37.05 kg/(m^2).  General Appearance: Pleasant, cooperative, healthy, and in no acute distress.  Eyes: Conjunctivae/corneas clear.  Head and Face: Normocephalic, atraumatic.  Minimal weakness of the marginal mandibular branch on the right, much improved from last visit.  Pinnae: Normal shape and position.   External auditory canals:  Patent without inflammation.  Tympanic membranes:  Intact, translucent, midposition, middle ear aerated.  Nose:  External pyramid midline. Mucosa normal. No purulence, polyps, or crusts.   Oral Cavity/Oropharynx: No mucosal lesions, masses, or pharyngeal asymmetry.  Neck:  No palpable thyroid, salivary gland, or neck masses.  The neck incision is healed very well.  She does have some numbness over the jaw area.          Assessment:  1. Pleomorphic adenoma        Plan:  1.  Reassured patient that numbness, tingling, and soreness will get better over time.  We told her this is normal and signs of her gaining sensation back.  2.  Follow up as needed, especially if it does not get better.    Donavan Burnet, MD 04/02/2015, 10:23     Delfino Lovett, MD  Assistant Professor  [facility] Department of Otolaryngology      PCP:  [name], MD  [address]  [place] [address]   REF:   [name], [place], [place] [address]             I saw and examined the patient.  I reviewed the resident's note.  I agree with the findings and plan of care as documented in the resident's note.  Any exceptions/additions are edited/noted.    Delfino Lovett, MD 04/07/2015, 13:13

## 2015-04-06 ENCOUNTER — Other Ambulatory Visit (HOSPITAL_COMMUNITY): Payer: Self-pay | Admitting: Psychiatry

## 2015-04-14 ENCOUNTER — Other Ambulatory Visit (HOSPITAL_COMMUNITY): Payer: Self-pay | Admitting: Psychiatry

## 2015-05-01 ENCOUNTER — Other Ambulatory Visit (HOSPITAL_BASED_OUTPATIENT_CLINIC_OR_DEPARTMENT_OTHER): Payer: Self-pay | Admitting: Dermatology

## 2015-05-01 ENCOUNTER — Other Ambulatory Visit (HOSPITAL_COMMUNITY): Payer: Self-pay | Admitting: Psychiatry

## 2015-05-01 ENCOUNTER — Encounter (HOSPITAL_BASED_OUTPATIENT_CLINIC_OR_DEPARTMENT_OTHER): Payer: Self-pay | Admitting: Student in an Organized Health Care Education/Training Program

## 2015-05-01 ENCOUNTER — Other Ambulatory Visit (HOSPITAL_BASED_OUTPATIENT_CLINIC_OR_DEPARTMENT_OTHER): Payer: Self-pay | Admitting: Family Medicine

## 2015-05-01 ENCOUNTER — Other Ambulatory Visit (INDEPENDENT_AMBULATORY_CARE_PROVIDER_SITE_OTHER): Payer: Self-pay | Admitting: Neurology

## 2015-05-01 ENCOUNTER — Other Ambulatory Visit (HOSPITAL_BASED_OUTPATIENT_CLINIC_OR_DEPARTMENT_OTHER): Payer: Self-pay | Admitting: Student in an Organized Health Care Education/Training Program

## 2015-05-01 DIAGNOSIS — L301 Dyshidrosis [pompholyx]: Secondary | ICD-10-CM

## 2015-05-01 MED ORDER — BUPROPION HCL SR 200 MG TABLET,12 HR SUSTAINED-RELEASE
ORAL_TABLET | ORAL | Status: DC
Start: 2015-05-01 — End: 2015-05-09

## 2015-05-01 MED ORDER — FLUOXETINE 20 MG TABLET
ORAL_TABLET | ORAL | Status: DC
Start: 2015-05-01 — End: 2015-05-09

## 2015-05-01 NOTE — Telephone Encounter (Signed)
From: [name]  To: [name], MD  Sent: 05/01/2015 9:15 AM EDT  Subject: Medication Renewal Request    Original authorizing provider: [name], MD    [name] would like a refill of the following medications:  nystatin (NYSTOP) 100,000 unit/gram Apply externally Powder [name], MD]    Preferred pharmacy: [id number] - [place], [address]    Comment:      Medication renewals requested in this message routed to other providers:  triamcinolone acetonide (ARISTOCORT A) 0.1 % Ointment Bonnetta Barry, MD]  gabapentin (NEURONTIN) 300 mg Oral Capsule Elmer Bales, MD]  buPROPion (WELLBUTRIN SR) 200 mg Oral Tablet Sustained Release [name], MD]  FLUoxetine (PROZAC) 20 mg Oral Tablet [name], MD]

## 2015-05-01 NOTE — Progress Notes (Signed)
Pt called 05-01-15 @ 3:10 pm to offer pt appt per Dr. Wynelle Fanny request. No answer. Left message to call clinic. Contact number left in message. Josefina Do, LPN

## 2015-05-01 NOTE — Telephone Encounter (Signed)
Please erx

## 2015-05-01 NOTE — Telephone Encounter (Signed)
From: [name]  To: Elmer Bales, MD  Sent: 05/01/2015 9:15 AM EDT  Subject: Medication Renewal Request    Original authorizing provider: Elmer Bales, MD    [name] would like a refill of the following medications:  gabapentin (NEURONTIN) 300 mg Oral Capsule Elmer Bales, MD]    Preferred pharmacy: [id number] - [place], [address]    Comment:      Medication renewals requested in this message routed to other providers:  nystatin (NYSTOP) 100,000 unit/gram Apply externally Powder [name], MD]  triamcinolone acetonide (ARISTOCORT A) 0.1 % Ointment Bonnetta Barry, MD]  buPROPion (WELLBUTRIN SR) 200 mg Oral Tablet Sustained Release [name], MD]  FLUoxetine (PROZAC) 20 mg Oral Tablet [name], MD]

## 2015-05-03 MED ORDER — NYSTATIN 100,000 UNIT/GRAM TOPICAL POWDER
CUTANEOUS | Status: DC
Start: 2015-05-03 — End: 2016-06-21

## 2015-05-06 ENCOUNTER — Encounter (HOSPITAL_BASED_OUTPATIENT_CLINIC_OR_DEPARTMENT_OTHER): Payer: Self-pay | Admitting: Student in an Organized Health Care Education/Training Program

## 2015-05-06 NOTE — Progress Notes (Signed)
Called pt 05-05-13 @ 2:20 pm to offer appt. Pt needs appt per Dr. Raphael Gibney after refill request for triamcinolone. Has not been since 2014. No answer. Left message. Josefina Do, LPN

## 2015-05-07 MED ORDER — GABAPENTIN 300 MG CAPSULE
600.00 mg | ORAL_CAPSULE | Freq: Three times a day (TID) | ORAL | Status: DC
Start: 2015-05-07 — End: 2016-02-16

## 2015-05-09 ENCOUNTER — Ambulatory Visit (INDEPENDENT_AMBULATORY_CARE_PROVIDER_SITE_OTHER): Payer: MEDICAID | Admitting: Psychiatry

## 2015-05-09 DIAGNOSIS — F339 Major depressive disorder, recurrent, unspecified: Secondary | ICD-10-CM

## 2015-05-09 DIAGNOSIS — F329 Major depressive disorder, single episode, unspecified: Secondary | ICD-10-CM

## 2015-05-09 MED ORDER — FLUOXETINE 20 MG TABLET
ORAL_TABLET | ORAL | Status: DC
Start: 2015-05-09 — End: 2015-10-21

## 2015-05-09 MED ORDER — BUPROPION HCL SR 200 MG TABLET,12 HR SUSTAINED-RELEASE
ORAL_TABLET | ORAL | Status: DC
Start: 2015-05-09 — End: 2015-08-05

## 2015-05-09 NOTE — Progress Notes (Addendum)
Behavioral Medicine and Psychiatry   Outpatient Progress Note      Ottilia Pippenger   [id number]  December 21, 1955  05/09/2015    DOS: 05/09/2015    CC: Depression    SUBJECTIVE:    Patient presents for first follow with this provider (Dr. Salena Saner) after previously being seen by Drs. Clark and [employer]. She reports that "I got pissed off. They didn't tell me that she walked out." Pt referring to Dr. Anell Barr departure. She reports sitting in the waiting room for hours before being seen by Dr. Myriam Forehand. This author to repair relationship and regain trust. She does states that "Every time I get close to somebody they leave." The nature of this being a teaching hospital was explained. Pt expresses understanding.     She reports that Wellbutrin "keeps me where I'm not too depressed." She does not voice any significant mood disturbance. She denies SI or AVH. She agrees to come back following her two month trip to [place] to reassess medications.          MEDICATIONS:  Current Outpatient Prescriptions   Medication Sig    atenolol (TENORMIN) 25 mg Oral Tablet Take 1 Tab (25 mg total) by mouth Once a day    buPROPion (WELLBUTRIN SR) 200 mg Oral Tablet Sustained Release TAKE 1 TABLET BY MOUTH ONCE DAILY    Clobetasol (TEMOVATE) 0.05 % Ointment Apply topically Twice daily To rash on hands and feet    Fluorometholone (FML) 0.1 % Ophthalmic Drops, Suspension Apply 1 Drop to eye Twice daily    FLUoxetine (PROZAC) 20 mg Oral Tablet TAKE 1 TABLET BY MOUTH ONCE A DAY (TABLET NECESSARY, ALLERGY TO DYE IN CAPSULE)    fluticasone (FLONASE) 50 mcg/actuation Nasal Spray, Suspension 2 Sprays by Each Nostril route Once a day.    gabapentin (NEURONTIN) 300 mg Oral Capsule Take 2 Caps (600 mg total) by mouth Three times a day For 7 days then take 2 Cap ( 600 mg) TID    Halobetasol Propionate (ULTRAVATE) 0.05 % Cream Apply  topically. Three times a week    Ibuprofen (MOTRIN) 400 mg Oral Tablet Take 1 Tab (400 mg total) by mouth  Three times a day as needed for Pain (Patient not taking: Reported on 04/02/2015)    Metronidazole (METROCREAM) 0.75 % Cream Apply topically Twice daily    mineral oil-hydrophil petrolat (AQUAPHOR) Ointment by Apply Topically route Four times a day as needed    nystatin (NYSTOP) 100,000 unit/gram Powder Apply once or twice daily to PREVENT rash under breasts    pantoprazole (PROTONIX) 20 mg Oral Tablet, Delayed Release (E.C.) Take 1 Tab (20 mg total) by mouth Every morning before breakfast    riboflavin, vitamin B2, (VITAMIN B-2) 100 mg Oral Tablet Take 4 Tabs (400 mg total) by mouth Once a day    triamcinolone acetonide (ARISTOCORT A) 0.1 % Ointment by Apply Topically route Twice daily       OBJECTIVE (4):     Level of Consciousness:  Alert   Orientation:    Oriented to person, place and time   Appearance:  Well groomed and dressed appropriately    Behavior:       Pleasant and cooperative, appropriate eye contact   Motor:           No movement abnormalities   Speech:         normal   Mood:           euthymic   Affect:  consistent with mood   Perception:    Did not appear to respond to internal stimuli   Thought Process:      Linear and goal directed   Thought Content:      No delusions, preoccupations or phobias noted.   Suicidal Ideation:       None    Homicidal Ideation:   None     ASSESSMENT:  Axis I: Major Depressive Disorder, history of agoraphobia now in remission   Axis II: none   Axis III: Dermatitis, GERD, CAD, recent kidney infection, diverticulitis   Axis IV: lack of social support, unemployment/disability, and living situation     PLAN:     Continue Prozac 20 mg daily  Continue Wellbutrin SR to 200 mg daily. Education offered re XL. Will discuss at next appointment.      Laboratory Studies:  N/A     Therapy:   I did not recommend that the patient see an outpatient therapist.      Follow up:  - Return to clinic in 3 months or sooner as needed.   - Patient advised to call with any  questions or concerns.   - Patient advised to report to nearest emergency department or to call 911 if having any suicidal or homicidal ideations.    Karle Plumber, MD  05/09/2015, 08:47      I saw and examined the patient.  I reviewed the resident's note.  I agree with the findings and plan of care as documented in the resident's note.  Any exceptions/additions are edited/noted. Pt reporting that she is doing well on current regimen, not voicing any current concerns about mood or medication.     Racheal Patches, MD  Child and Adolescent Psychiatrist  Assistant [place]  [place]  [address]  [place] [address]

## 2015-05-25 ENCOUNTER — Other Ambulatory Visit (HOSPITAL_COMMUNITY): Payer: Self-pay | Admitting: Psychiatry

## 2015-05-27 ENCOUNTER — Encounter (HOSPITAL_BASED_OUTPATIENT_CLINIC_OR_DEPARTMENT_OTHER): Payer: Self-pay | Admitting: Student in an Organized Health Care Education/Training Program

## 2015-05-28 ENCOUNTER — Encounter (INDEPENDENT_AMBULATORY_CARE_PROVIDER_SITE_OTHER): Payer: MEDICAID | Admitting: PHYSICAL MEDICINE AND REHABILITATION

## 2015-06-02 ENCOUNTER — Encounter (HOSPITAL_BASED_OUTPATIENT_CLINIC_OR_DEPARTMENT_OTHER): Payer: MEDICAID | Admitting: Family Medicine

## 2015-06-04 ENCOUNTER — Other Ambulatory Visit (HOSPITAL_COMMUNITY): Payer: Self-pay | Admitting: Psychiatry

## 2015-06-06 NOTE — Telephone Encounter (Signed)
Rx issued with 2 refills May 09, 2015.

## 2015-07-26 ENCOUNTER — Other Ambulatory Visit: Payer: Self-pay

## 2015-07-29 ENCOUNTER — Ambulatory Visit: Payer: MEDICAID | Attending: Dermatology | Admitting: Student in an Organized Health Care Education/Training Program

## 2015-07-29 VITALS — BP 128/72 | Ht 65.28 in | Wt 215.6 lb

## 2015-07-29 DIAGNOSIS — Z79899 Other long term (current) drug therapy: Secondary | ICD-10-CM | POA: Insufficient documentation

## 2015-07-29 DIAGNOSIS — L301 Dyshidrosis [pompholyx]: Secondary | ICD-10-CM | POA: Insufficient documentation

## 2015-07-29 DIAGNOSIS — Z808 Family history of malignant neoplasm of other organs or systems: Secondary | ICD-10-CM | POA: Insufficient documentation

## 2015-07-29 DIAGNOSIS — L719 Rosacea, unspecified: Secondary | ICD-10-CM | POA: Insufficient documentation

## 2015-07-29 MED ORDER — TRIAMCINOLONE ACETONIDE 0.1 % TOPICAL OINTMENT
TOPICAL_OINTMENT | Freq: Two times a day (BID) | CUTANEOUS | Status: DC
Start: 2015-07-29 — End: 2021-11-04

## 2015-07-29 MED ORDER — METRONIDAZOLE 0.75 % TOPICAL CREAM
TOPICAL_CREAM | Freq: Two times a day (BID) | CUTANEOUS | Status: DC
Start: 2015-07-29 — End: 2021-02-07

## 2015-07-29 MED ORDER — AZELAIC ACID 20 % TOPICAL CREAM
TOPICAL_CREAM | Freq: Two times a day (BID) | CUTANEOUS | Status: DC
Start: 2015-07-29 — End: 2022-08-27

## 2015-07-29 NOTE — Progress Notes (Addendum)
Subjective:       Patient ID: [name] is a 59 y.o. female     Chief Complaint:     Chief Complaint   Patient presents with    Skin Check     3 month f/u eczema        HPI   59 year old with no personal hx of skin cancer with family history of NMSC as well as melanoma in her niece presents for a follow up. Pt has hx of dishydrotic eczema.She has a history of 1+ positive patch test to thimerosal and potassium dichromate. She reports   She notes it is well controlled on triamcinole topical. 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 Notes the rash when present is intensely pruritic and is worsened by wearing socks and shoes. She has noticed improvement of the lesion during the summer time and wearing flip flops. She otherwise denies any new, changing, bleeding, or rapidly growing lesions and has no other skin-related complaints.      Review of Systems   Constitutional: Negative for fever and chills.   Skin: Negative for rash.        - pruritus.     Current Outpatient Prescriptions   Medication Sig    atenolol (TENORMIN) 25 mg Oral Tablet Take 1 Tab (25 mg total) by mouth Once a day    Azelaic Acid 20 % Cream Apply topically Twice daily    buPROPion (WELLBUTRIN SR) 200 mg Oral Tablet Sustained Release TAKE 1 TABLET BY MOUTH ONCE DAILY    Clobetasol (TEMOVATE) 0.05 % Ointment Apply topically Twice daily To rash on hands and feet    Fluorometholone (FML) 0.1 % Ophthalmic Drops, Suspension Apply 1 Drop to eye Twice daily    FLUoxetine (PROZAC) 20 mg Oral Tablet TAKE 1 TABLET BY MOUTH ONCE A DAY (TABLET NECESSARY, ALLERGY TO DYE IN CAPSULE)    FLUoxetine (PROZAC) 20 mg Oral Tablet TAKE 1 TABLET BY MOUTH ONCE A DAY (TABLET NECESSARY, ALLERGY TO DYE IN CAPSULE)    fluticasone (FLONASE) 50 mcg/actuation Nasal Spray, Suspension 2 Sprays by Each Nostril route Once a day. (Patient not taking: Reported on 07/29/2015)    gabapentin (NEURONTIN) 300 mg Oral Capsule Take 2 Caps (600 mg total) by  mouth Three times a day For 7 days then take 2 Cap ( 600 mg) TID    Halobetasol Propionate (ULTRAVATE) 0.05 % Cream Apply  topically. Three times a week    Ibuprofen (MOTRIN) 400 mg Oral Tablet Take 1 Tab (400 mg total) by mouth Three times a day as needed for Pain (Patient not taking: Reported on 04/02/2015)    Metronidazole (METROCREAM) 0.75 % Cream Apply topically Twice daily    mineral oil-hydrophil petrolat (AQUAPHOR) Ointment by Apply Topically route Four times a day as needed    nystatin (NYSTOP) 100,000 unit/gram Powder Apply once or twice daily to PREVENT rash under breasts    pantoprazole (PROTONIX) 20 mg Oral Tablet, Delayed Release (E.C.) Take 1 Tab (20 mg total) by mouth Every morning before breakfast    riboflavin, vitamin B2, (VITAMIN B-2) 100 mg Oral Tablet Take 4 Tabs (400 mg total) by mouth Once a day    triamcinolone acetonide (ARISTOCORT A) 0.1 % Ointment by Apply Topically route Twice daily       Objective:   .   Filed Vitals:    07/29/15 1248   BP: 128/72   Height: 1.658 m (5' 5.28")   Weight:  97.8 kg (215 lb 9.8 oz)       Physical Exam   Constitutional: She appears well-nourished. No distress.   Skin:          Partial skin exam was performed including head, neck, anterior and posterior trunk and revealed no areas of concern other than those documented.      Assessment & Plan:     Rosacea (#1 body)  - Continue with metronidazole 1% apply daily  - Apply azealic acid topically daily  - Recommended using sunscreen with SPF at least 30 and re-apply every 2-3 hours.    Dyshidrotic Eczema (#2 body)  - Improved  - Refilled triamcinolone 0.1% topically apply as needed to affected areas BID  - Advised patient about skin thinning side effects.   - Moisturize with Vaseline and wear white cotton gloves under the rubber gloves.   - Continue all unscented products.      Mannie Stabile, MD  07/29/2015, 13:24          I saw and examined the patient.  I reviewed the resident's note.  I agree with the  findings and plan of care as documented in the resident's note.  Any exceptions/additions are edited/noted.    Dairl Ponder, MD

## 2015-07-30 ENCOUNTER — Encounter (HOSPITAL_COMMUNITY): Payer: Self-pay

## 2015-08-05 ENCOUNTER — Ambulatory Visit (INDEPENDENT_AMBULATORY_CARE_PROVIDER_SITE_OTHER): Payer: MEDICAID | Admitting: Psychiatry

## 2015-08-05 DIAGNOSIS — F4000 Agoraphobia, unspecified: Secondary | ICD-10-CM

## 2015-08-05 DIAGNOSIS — F329 Major depressive disorder, single episode, unspecified: Secondary | ICD-10-CM

## 2015-08-05 DIAGNOSIS — F339 Major depressive disorder, recurrent, unspecified: Secondary | ICD-10-CM

## 2015-08-05 MED ORDER — BUPROPION HCL XL 300 MG 24 HR TABLET, EXTENDED RELEASE
300.00 mg | ORAL_TABLET | Freq: Every morning | ORAL | Status: DC
Start: 2015-08-05 — End: 2015-10-09

## 2015-08-05 NOTE — Progress Notes (Addendum)
Behavioral Medicine and Psychiatry   Outpatient Progress Note      Azelia Reiger   [id number]  04-10-1956  08/05/2015    DOS: 08/05/2015    ID/CC: Greidys Deland is a 59 yo CF from [place], [place], with PMH of CAD, HLD, s/p resection of R neck pleomorphic adenoma, GERD, MDD and agoraphobia.    Interval History:    Patient presents for scheduled follow up. Since returning from her sisters in [place] pt reports that she reports that "I feel like I ain't got not get up and go." She explains that "I just sit there and watch TV or just lay around."  She reports that "I think I do okay around people but when I'm not around people I don't do too good." She endorses isolation throughout the week compounded by the fact the she is unemployed and had significant financial limitations. She denies SI/HI and AVH.    Social History  Children: 3 adult children. Two daughters live in [place]; son in [place], [place].  Education: Has to bachelor's degrees from [employer] (distance learning) in [place] and [employer] and [employer]    Past Medical History  Past Medical History   Diagnosis Date    Multinodular goiter     Headache(784.0)      Migraines    Arthritis     Vaginal prolapse 2012     surgery improved significantly    Rheumatic fever 17     no complications    Depression     GERD (gastroesophageal reflux disease)      controlled w/med    Wears glasses     Wears dentures     Hyperlipidemia LDL goal < 100 08/29/2013    Heart murmur 1979     benign    MINOR CAD (coronary artery disease) 11/10/2012     no treatment other than cholesterol medications; follows regularly with Dr. Rodney Booze    Squamous cell carcinoma 02/06/2015       MEDICATIONS:  Current Outpatient Prescriptions   Medication Sig    atenolol (TENORMIN) 25 mg Oral Tablet Take 1 Tab (25 mg total) by mouth Once a day    Azelaic Acid 20 % Cream Apply topically Twice daily    buPROPion (WELLBUTRIN XL) 300 mg Oral Tablet Sustained  Release 24 hr Take 1 Tab (300 mg total) by mouth Every morning    Clobetasol (TEMOVATE) 0.05 % Ointment Apply topically Twice daily To rash on hands and feet    Fluorometholone (FML) 0.1 % Ophthalmic Drops, Suspension Apply 1 Drop to eye Twice daily    FLUoxetine (PROZAC) 20 mg Oral Tablet TAKE 1 TABLET BY MOUTH ONCE A DAY (TABLET NECESSARY, ALLERGY TO DYE IN CAPSULE)    FLUoxetine (PROZAC) 20 mg Oral Tablet TAKE 1 TABLET BY MOUTH ONCE A DAY (TABLET NECESSARY, ALLERGY TO DYE IN CAPSULE)    fluticasone (FLONASE) 50 mcg/actuation Nasal Spray, Suspension 2 Sprays by Each Nostril route Once a day. (Patient not taking: Reported on 07/29/2015)    gabapentin (NEURONTIN) 300 mg Oral Capsule Take 2 Caps (600 mg total) by mouth Three times a day For 7 days then take 2 Cap ( 600 mg) TID    Halobetasol Propionate (ULTRAVATE) 0.05 % Cream Apply  topically. Three times a week    Ibuprofen (MOTRIN) 400 mg Oral Tablet Take 1 Tab (400 mg total) by mouth Three times a day as needed for Pain (Patient not taking: Reported on 04/02/2015)  Metronidazole (METROCREAM) 0.75 % Cream Apply topically Twice daily    mineral oil-hydrophil petrolat (AQUAPHOR) Ointment by Apply Topically route Four times a day as needed    nystatin (NYSTOP) 100,000 unit/gram Powder Apply once or twice daily to PREVENT rash under breasts    pantoprazole (PROTONIX) 20 mg Oral Tablet, Delayed Release (E.C.) Take 1 Tab (20 mg total) by mouth Every morning before breakfast    riboflavin, vitamin B2, (VITAMIN B-2) 100 mg Oral Tablet Take 4 Tabs (400 mg total) by mouth Once a day    triamcinolone acetonide (ARISTOCORT A) 0.1 % Ointment by Apply Topically route Twice daily       OBJECTIVE (4):   Level of Consciousness:  Alert   Orientation:    Oriented to person, place and time   Appearance:  Well groomed and dressed appropriately    Behavior:       Pleasant and cooperative, appropriate eye contact   Motor:           No movement abnormalities   Speech:          normal   Mood:           "sad", "depressed"   Affect:          consistent with mood   Perception:    Did not appear to respond to internal stimuli   Thought Process:      Linear and goal directed   Thought Content:      No delusions, preoccupations or phobias noted.   Suicidal Ideation:       None    Homicidal Ideation:   None     Treatment Course: Siri Buege is a 59 yo CF from [place], [place], with PMH of CAD, HLD, s/p resection of R neck pleomorphic adenoma, GERD, MDD and agoraphobia who presented for her first encounter with this provider (Dr. Salena Saner) on 15 July, 2016. Pt's Wellbutrin to be increased and pt amenable to IPT.    Assessment/Plan:  MDD  - change Wellbutrin from SR 200 mg to XL 300 mg po qam  - continue Prozac 20 mg daily  - discussed ITP options with patient, patient willing    Agoraphobia  - continue Prozac 20 mg daily    Safety  - pt denies SI, AVH    Follow up  - RTC 2 months  - will schedule for ITP    Karle Plumber, MD  08/05/2015, 10:43    Late entry for 08/05/2015. I saw and examined the patient.  I reviewed the resident's note.  I agree with the findings and plan of care as documented in the resident's note.  Any exceptions/additions are edited/noted.    Blair Dolphin, MD

## 2015-09-11 ENCOUNTER — Encounter (HOSPITAL_BASED_OUTPATIENT_CLINIC_OR_DEPARTMENT_OTHER): Payer: Self-pay | Admitting: Family Medicine

## 2015-09-22 ENCOUNTER — Encounter (INDEPENDENT_AMBULATORY_CARE_PROVIDER_SITE_OTHER): Payer: Self-pay

## 2015-09-22 ENCOUNTER — Ambulatory Visit (HOSPITAL_BASED_OUTPATIENT_CLINIC_OR_DEPARTMENT_OTHER): Payer: Self-pay | Admitting: Family Medicine

## 2015-09-22 DIAGNOSIS — Z1239 Encounter for other screening for malignant neoplasm of breast: Secondary | ICD-10-CM

## 2015-09-29 ENCOUNTER — Encounter (HOSPITAL_COMMUNITY): Payer: Self-pay

## 2015-10-05 ENCOUNTER — Other Ambulatory Visit (HOSPITAL_COMMUNITY): Payer: Self-pay | Admitting: Psychiatry

## 2015-10-05 ENCOUNTER — Encounter (HOSPITAL_COMMUNITY): Payer: Self-pay | Admitting: Psychiatry

## 2015-10-06 ENCOUNTER — Other Ambulatory Visit (HOSPITAL_COMMUNITY): Payer: Self-pay | Admitting: Psychiatry

## 2015-10-06 DIAGNOSIS — F331 Major depressive disorder, recurrent, moderate: Secondary | ICD-10-CM

## 2015-10-06 MED ORDER — FLUOXETINE 20 MG TABLET
20.0000 mg | ORAL_TABLET | Freq: Every day | ORAL | 0 refills | Status: DC
Start: 2015-10-06 — End: 2015-12-10

## 2015-10-09 ENCOUNTER — Other Ambulatory Visit (HOSPITAL_COMMUNITY): Payer: Self-pay | Admitting: Psychiatry

## 2015-10-13 ENCOUNTER — Encounter (HOSPITAL_COMMUNITY): Payer: Self-pay | Admitting: Psychiatry

## 2015-10-14 ENCOUNTER — Encounter (HOSPITAL_COMMUNITY): Payer: MEDICAID | Admitting: Psychiatry

## 2015-10-21 ENCOUNTER — Ambulatory Visit (INDEPENDENT_AMBULATORY_CARE_PROVIDER_SITE_OTHER): Payer: MEDICAID | Admitting: Psychiatry

## 2015-10-21 DIAGNOSIS — F329 Major depressive disorder, single episode, unspecified: Secondary | ICD-10-CM

## 2015-10-21 DIAGNOSIS — F331 Major depressive disorder, recurrent, moderate: Secondary | ICD-10-CM

## 2015-10-21 DIAGNOSIS — F4002 Agoraphobia without panic disorder: Secondary | ICD-10-CM

## 2015-10-21 MED ORDER — FLUOXETINE 20 MG TABLET
ORAL_TABLET | ORAL | 2 refills | Status: DC
Start: 2015-10-21 — End: 2015-12-10

## 2015-10-21 MED ORDER — BUPROPION HCL XL 300 MG 24 HR TABLET, EXTENDED RELEASE
ORAL_TABLET | ORAL | 2 refills | Status: DC
Start: 2015-10-21 — End: 2016-04-09

## 2015-10-21 NOTE — Progress Notes (Addendum)
Behavioral Medicine and Psychiatry   Outpatient Progress Note      Jin Labarge   [id number]  Dec 03, 1955  10/21/2015    DOS: 10/21/2015    ID/CC: [name] is a 59 yo CF from [place], [place], with PMH of CAD, HLD, s/p resection of R neck pleomorphic adenoma, GERD, MDD and agoraphobia.    Interval History:    Pt last seen October 11 and presents today for scheduled follow up. At last encounter the pts Wellbutrin was changed from 200 mg SR daily to 300 mg XL daily. Pt reports that she has been off of Prozac for one week due to her recent prescription being for capsules and not tablets. She reports that she cannot take capsules due to allergy to the dye. Pt reports fatigue and that her mood has decompensated to the point where "I just want to stay in the house; I don't want to do nothing." She reports stability when taking Prozac consistently.    She denies SI or other acute safety concerns.    Social History  Children: 3 adult children. Two daughters live in [place]; son in [place], [place].  Education: Has to bachelor's degrees from [employer] (distance learning) in [place] and [employer] and [employer]    Past Medical History  Past Medical History   Diagnosis Date    Arthritis     Depression     GERD (gastroesophageal reflux disease)      controlled w/med    Headache(784.0)      Migraines    Heart murmur 1979     benign    Hyperlipidemia LDL goal < 100 08/29/2013    MINOR CAD (coronary artery disease) 11/10/2012     no treatment other than cholesterol medications; follows regularly with Dr. Rodney Booze    Multinodular goiter     Rheumatic fever 16     no complications    Squamous cell carcinoma 02/06/2015    Vaginal prolapse 2012     surgery improved significantly    Wears dentures     Wears glasses        MEDICATIONS:  Current Outpatient Prescriptions   Medication Sig    atenolol (TENORMIN) 25 mg Oral Tablet Take 1 Tab (25 mg total) by mouth Once a day    Azelaic Acid 20 %  Cream Apply topically Twice daily    buPROPion (WELLBUTRIN XL) 300 mg Oral Tablet Sustained Release 24 hr TAKE 1 TAB (300 MG TOTAL) BY MOUTH EVERY MORNING    Clobetasol (TEMOVATE) 0.05 % Ointment Apply topically Twice daily To rash on hands and feet    Fluorometholone (FML) 0.1 % Ophthalmic Drops, Suspension Apply 1 Drop to eye Twice daily    FLUoxetine (PROZAC) 20 mg Oral Tablet TAKE 1 TABLET BY MOUTH ONCE A DAY (TABLET NECESSARY, ALLERGY TO DYE IN CAPSULE)    FLUoxetine (PROZAC) 20 mg Oral Tablet Take 1 Tab (20 mg total) by mouth Once a day    fluticasone (FLONASE) 50 mcg/actuation Nasal Spray, Suspension 2 Sprays by Each Nostril route Once a day. (Patient not taking: Reported on 07/29/2015)    gabapentin (NEURONTIN) 300 mg Oral Capsule Take 2 Caps (600 mg total) by mouth Three times a day For 7 days then take 2 Cap ( 600 mg) TID    Halobetasol Propionate (ULTRAVATE) 0.05 % Cream Apply  topically. Three times a week    Ibuprofen (MOTRIN) 400 mg Oral Tablet Take 1 Tab (400 mg total) by mouth  Three times a day as needed for Pain (Patient not taking: Reported on 04/02/2015)    Metronidazole (METROCREAM) 0.75 % Cream Apply topically Twice daily    mineral oil-hydrophil petrolat (AQUAPHOR) Ointment by Apply Topically route Four times a day as needed    nystatin (NYSTOP) 100,000 unit/gram Powder Apply once or twice daily to PREVENT rash under breasts    pantoprazole (PROTONIX) 20 mg Oral Tablet, Delayed Release (E.C.) Take 1 Tab (20 mg total) by mouth Every morning before breakfast    riboflavin, vitamin B2, (VITAMIN B-2) 100 mg Oral Tablet Take 4 Tabs (400 mg total) by mouth Once a day    triamcinolone acetonide (ARISTOCORT A) 0.1 % Ointment by Apply Topically route Twice daily     Medication Trials: Prozac, Wellbutrin, Lexapro, Effexor, Cymbalta    OBJECTIVE (4):   Level of Consciousness:  Alert   Orientation:    Oriented to person, place and time   Appearance:  Well groomed and dressed appropriately       Behavior:       Pleasant and cooperative, appropriate eye contact   Motor:           No movement abnormalities   Speech:         normal   Mood:           dysthymic   Affect:          consistent with mood   Perception:    Did not appear to respond to internal stimuli   Thought Process:      Linear and goal directed   Thought Content:      No delusions, preoccupations or phobias noted.   Suicidal Ideation:       None    Homicidal Ideation:   None     Treatment Course: Kymberli Tippit is a 59 yo CF from [place], [place], with PMH of CAD, HLD, s/p resection of R neck pleomorphic adenoma, GERD, MDD and agoraphobia who presented for her first encounter with this provider (Dr. Salena Saner) on 15 July, 2016. Pt's Wellbutrin was transitioned from 200 mg SR to 300 mg XL. Pt requires Prozac tablets due to allergy dye in the capsules and her mood hs decompensated while being without the Prozac.    Assessment/Plan:  MDD  - cont Wellbutrin XL 300 mg po qam  - continue Prozac 20 mg daily    Agoraphobia  - continue Prozac 20 mg daily    Safety  - pt denies SI, AVH    Follow up  - RTC 2 months  - will schedule for ITP    Karle Plumber, MD  10/21/2015, 10:48      Late entry for  10/21/2015. I saw and examined the patient.  I reviewed the resident's note.  I agree with the findings and plan of care as documented in the resident's note.  Any exceptions/additions are edited/noted.    Burman Blacksmith, MD 11/03/2015, 11:28

## 2015-11-03 ENCOUNTER — Encounter (HOSPITAL_COMMUNITY): Payer: Self-pay

## 2015-11-03 ENCOUNTER — Ambulatory Visit (HOSPITAL_BASED_OUTPATIENT_CLINIC_OR_DEPARTMENT_OTHER): Payer: MEDICAID | Admitting: GENERAL

## 2015-11-03 ENCOUNTER — Ambulatory Visit (HOSPITAL_BASED_OUTPATIENT_CLINIC_OR_DEPARTMENT_OTHER): Payer: MEDICAID

## 2015-11-03 ENCOUNTER — Ambulatory Visit
Admission: RE | Admit: 2015-11-03 | Discharge: 2015-11-03 | Disposition: A | Discharge status: Home / Self Care | Payer: MEDICAID | Source: Ambulatory Visit | Attending: Family Medicine | Admitting: Family Medicine

## 2015-11-03 ENCOUNTER — Encounter (HOSPITAL_BASED_OUTPATIENT_CLINIC_OR_DEPARTMENT_OTHER): Payer: Self-pay | Admitting: GENERAL

## 2015-11-03 VITALS — BP 128/82 | HR 85 | Temp 97.3°F | Ht 65.28 in | Wt 213.6 lb

## 2015-11-03 DIAGNOSIS — R1013 Epigastric pain: Secondary | ICD-10-CM | POA: Insufficient documentation

## 2015-11-03 DIAGNOSIS — R3911 Hesitancy of micturition: Secondary | ICD-10-CM | POA: Insufficient documentation

## 2015-11-03 DIAGNOSIS — Z1239 Encounter for other screening for malignant neoplasm of breast: Secondary | ICD-10-CM

## 2015-11-03 DIAGNOSIS — K219 Gastro-esophageal reflux disease without esophagitis: Secondary | ICD-10-CM | POA: Insufficient documentation

## 2015-11-03 DIAGNOSIS — K2970 Gastritis, unspecified, without bleeding: Secondary | ICD-10-CM | POA: Insufficient documentation

## 2015-11-03 DIAGNOSIS — Z1231 Encounter for screening mammogram for malignant neoplasm of breast: Secondary | ICD-10-CM | POA: Insufficient documentation

## 2015-11-03 HISTORY — DX: Malignant (primary) neoplasm, unspecified: C80.1

## 2015-11-03 LAB — CBC WITH DIFF
BASOPHIL #: 0.06 x10ˆ3/uL (ref 0.00–0.20)
EOSINOPHIL #: 0.16 x10ˆ3/uL (ref 0.00–0.50)
HCT: 44.9 % (ref 33.5–45.2)
HGB: 14.8 g/dL (ref 11.2–15.2)
LYMPHOCYTE #: 2.59 x10ˆ3/uL (ref 1.00–4.80)
LYMPHOCYTE %: 33 %
MCH: 28.3 pg (ref 27.4–33.0)
MCHC: 33.1 g/dL (ref 32.5–35.8)
MCV: 85.7 fL (ref 78.0–100.0)
MONOCYTE #: 0.62 x10ˆ3/uL (ref 0.30–1.00)
MPV: 7.6 fL (ref 7.5–11.5)
NEUTROPHIL #: 4.53 x10ˆ3/uL (ref 1.50–7.70)
NEUTROPHIL %: 57 %
PLATELETS: 376 x10ˆ3/uL (ref 140–450)
RBC: 5.23 x10ˆ6/uL — ABNORMAL HIGH (ref 3.63–4.92)
RDW: 14.9 % (ref 12.0–15.0)
WBC: 8 x10ˆ3/uL (ref 3.5–11.0)

## 2015-11-03 LAB — BASIC METABOLIC PANEL
ANION GAP: 8 mmol/L (ref 4–13)
BUN/CREA RATIO: 18 (ref 6–22)
BUN: 20 mg/dL (ref 8–25)
CALCIUM: 10.2 mg/dL (ref 8.5–10.4)
CHLORIDE: 105 mmol/L (ref 96–111)
CO2 TOTAL: 26 mmol/L (ref 22–32)
CREATININE: 1.09 mg/dL (ref 0.49–1.10)
ESTIMATED GFR: 55 mL/min/1.73mˆ2 — ABNORMAL LOW (ref >59)
GLUCOSE: 97 mg/dL (ref 65–139)
POTASSIUM: 4.5 mmol/L (ref 3.5–5.1)
SODIUM: 139 mmol/L (ref 136–145)

## 2015-11-03 LAB — URINALYSIS, MACROSCOPIC
BILIRUBIN: NEGATIVE mg/dL
COLOR: NORMAL
GLUCOSE: NEGATIVE mg/dL
KETONES: NEGATIVE mg/dL
LEUKOCYTES: NEGATIVE WBCs/uL
NITRITE: NEGATIVE
PH: 5 (ref 5.0–8.0)
PROTEIN: NEGATIVE mg/dL
SPECIFIC GRAVITY: 1.018 (ref 1.005–1.030)
UROBILINOGEN: NEGATIVE mg/dL

## 2015-11-03 LAB — URINALYSIS, MICROSCOPIC
HYALINE CASTS: 1 /LPF (ref <4.0)
RBCS: <1 /HPF (ref <6.0)
WBCS: 1 /HPF (ref <11.0)

## 2015-11-03 LAB — HEPATIC FUNCTION PANEL
ALBUMIN: 3.8 g/dL (ref 3.5–5.0)
ALKALINE PHOSPHATASE: 69 U/L (ref <150)
ALT (SGPT): 19 U/L (ref <55)
AST (SGOT): 17 U/L (ref 8–41)
BILIRUBIN DIRECT: 0.1 mg/dL (ref <=0.3)
BILIRUBIN TOTAL: 0.3 mg/dL (ref 0.3–1.3)
PROTEIN TOTAL: 7.6 g/dL (ref 6.4–8.3)

## 2015-11-03 LAB — LIPASE: LIPASE: 74 U/L (ref 10–80)

## 2015-11-03 LAB — AMYLASE: AMYLASE: 51 U/L (ref 25–125)

## 2015-11-03 MED ORDER — PANTOPRAZOLE 40 MG TABLET,DELAYED RELEASE
40.0000 mg | DELAYED_RELEASE_TABLET | Freq: Every morning | ORAL | 11 refills | Status: DC
Start: 2015-11-03 — End: 2016-01-13

## 2015-11-03 MED ORDER — RANITIDINE 15 MG/ML ORAL SYRUP
150.00 mg | ORAL_SOLUTION | Freq: Every evening | ORAL | 5 refills | Status: DC
Start: 2015-11-03 — End: 2018-02-23

## 2015-11-03 NOTE — Progress Notes (Addendum)
[facility]   Department of Family Medicine   Clinic Progress Note    Walter Greig  MRN: [id number]  DOB: 1956-01-13  Date of Service: 11/03/2015    CHIEF COMPLAINT  Chief Complaint   Patient presents with    Abdominal Pain     after eating        SUBJECTIVE  [name] is a 60 y.o. female who presents to clinic for the following:    Sharp epigastric abdominal pain, begins after eating, started 10 days ago. Also happens with lying down at times. No radiation of pain. She also feels fatigued. No nausea or vomiting. She does have a sensation of reflux into the back of her throat. She has regular well formed bowel movements which are not difficult to pass and occur daily. No melena or bloody stool. She does have hemorrhoids which will cause a small amount of blood occasionally. No fever. She has also noted some urinary hesitancy over the same time period. No dysuria or hematuria. She has a long time history of reflux disease with a gastroscopy in 2011 showing mild gastritis.    She has a mild cough for the last week as well, nonproductive. She denies shortness of breath, chest pain, palpitations.    Lost 9 lbs since last visit. Decreased appetite.        Review of Systems:  Constitutional: negative for fevers, chills  Respiratory: ++cough  Gastrointestinal: ++abdominal pain  Genitourinary: ++hesitancy      Medications:     Outpatient Medications Prior to Visit:  atenolol (TENORMIN) 25 mg Oral Tablet Take 1 Tab (25 mg total) by mouth Once a day   Azelaic Acid 20 % Cream Apply topically Twice daily   buPROPion (WELLBUTRIN XL) 300 mg Oral Tablet Sustained Release 24 hr TAKE 1 TAB (300 MG TOTAL) BY MOUTH EVERY MORNING   Clobetasol (TEMOVATE) 0.05 % Ointment Apply topically Twice daily To rash on hands and feet   Fluorometholone (FML) 0.1 % Ophthalmic Drops, Suspension Apply 1 Drop to eye Twice daily   FLUoxetine (PROZAC) 20 mg Oral Tablet Take 1 Tab (20 mg total) by mouth Once a day   FLUoxetine  (PROZAC) 20 mg Oral Tablet TAKE 1 TABLET BY MOUTH ONCE A DAY (TABLET NECESSARY, ALLERGY TO DYE IN CAPSULE)   fluticasone (FLONASE) 50 mcg/actuation Nasal Spray, Suspension 2 Sprays by Each Nostril route Once a day. (Patient not taking: Reported on 07/29/2015)   gabapentin (NEURONTIN) 300 mg Oral Capsule Take 2 Caps (600 mg total) by mouth Three times a day For 7 days then take 2 Cap ( 600 mg) TID   Halobetasol Propionate (ULTRAVATE) 0.05 % Cream Apply  topically. Three times a week   Ibuprofen (MOTRIN) 400 mg Oral Tablet Take 1 Tab (400 mg total) by mouth Three times a day as needed for Pain (Patient not taking: Reported on 04/02/2015)   Metronidazole (METROCREAM) 0.75 % Cream Apply topically Twice daily   mineral oil-hydrophil petrolat (AQUAPHOR) Ointment by Apply Topically route Four times a day as needed   nystatin (NYSTOP) 100,000 unit/gram Powder Apply once or twice daily to PREVENT rash under breasts   riboflavin, vitamin B2, (VITAMIN B-2) 100 mg Oral Tablet Take 4 Tabs (400 mg total) by mouth Once a day   triamcinolone acetonide (ARISTOCORT A) 0.1 % Ointment by Apply Topically route Twice daily   pantoprazole (PROTONIX) 20 mg Oral Tablet, Delayed Release (E.C.) Take 1 Tab (20 mg total) by mouth Every morning  before breakfast     No facility-administered medications prior to visit.     Allergies:   Allergies   Allergen Reactions    Cymbalta [Duloxetine]      Rash on feet and hands    Blue Dye      Told to avoid due to patch test result by Simone Curia    Lipitor [Atorvastatin]      Muscle pain    Mobic [Meloxicam] Nausea/ Vomiting    Nickel     Naprosyn [Naproxen] Nausea/ Vomiting         OBJECTIVE  Visit Vitals    BP 128/82    Pulse 85    Temp 36.3 C (97.3 F) (Thermal Scan)    Ht 1.658 m (5' 5.28")    Wt 96.9 kg (213 lb 10 oz)    SpO2 96%    BMI 35.25 kg/m2     General: no distress  HENT: mouth mucous membranes moist, pharynx without injection or exudate   Lungs: clear to auscultation  bilaterally  Cardiovascular: RRR, no murmur  Abdomen: soft, +RUQ abdominal tenderness, epigastric tenderness, no rebound or guarding, bowel sounds present  Extremities: no cyanosis or edema  Skin: warm and dry, no rash  Neurologic: gait is normal, AOx3, CN 2-12 grossly intact  Psychiatric: normal affect and behavior    ASSESSMENT/PLAN  (R10.13) Epigastric pain  (primary encounter diagnosis)  Plan: LIPASE, HEPATIC FUNCTION PANEL, BMP (         non-fasting), AMYLASE, CBC/DIFF. I suspect worsening of her chronic reflux disease. Increase Protonix to 40 mg daily and add Zantac 150 mg nightly. Follow up in 2 weeks with PCP as scheduled.          (R39.11) Urinary hesitancy  Plan: URINALYSIS, MACROSCOPIC AND MICROSCOPIC         W/CULTURE REFLEX            Orders Placed This Encounter    LIPASE    HEPATIC FUNCTION PANEL    BMP ( non-fasting)    AMYLASE    CBC/DIFF    URINALYSIS, MACROSCOPIC AND MICROSCOPIC W/CULTURE REFLEX    pantoprazole (PROTONIX) 40 mg Oral Tablet, Delayed Release (E.C.)    raNITIdine (ZANTAC) 15 mg/mL Oral Syrup         Return in about 2 weeks (around 11/17/2015) for as scheduled with PCP.        Rockwell Germany, MD 11/03/2015, 11:41  I discussed the patient's care with the Resident prior to the patient leaving the clinic. Any significant discussion points are noted .    Lelon Mast, DO

## 2015-11-03 NOTE — Progress Notes (Signed)
I discussed the patient's care with the Resident prior to the patient leaving the clinic. Any significant discussion points are noted .    [name] C Peaches Vanoverbeke, DO

## 2015-11-12 ENCOUNTER — Encounter (INDEPENDENT_AMBULATORY_CARE_PROVIDER_SITE_OTHER): Payer: MEDICAID | Admitting: OPHTHALMOLOGY

## 2015-11-17 ENCOUNTER — Encounter (HOSPITAL_BASED_OUTPATIENT_CLINIC_OR_DEPARTMENT_OTHER): Payer: Self-pay | Admitting: Family Medicine

## 2015-11-17 ENCOUNTER — Ambulatory Visit: Payer: MEDICAID | Attending: Family Medicine | Admitting: Family Medicine

## 2015-11-17 VITALS — BP 115/83 | HR 79 | Temp 98.2°F | Ht 65.28 in | Wt 214.5 lb

## 2015-11-17 DIAGNOSIS — M7042 Prepatellar bursitis, left knee: Secondary | ICD-10-CM | POA: Insufficient documentation

## 2015-11-17 DIAGNOSIS — M222X9 Patellofemoral disorders, unspecified knee: Secondary | ICD-10-CM

## 2015-11-17 DIAGNOSIS — M222X2 Patellofemoral disorders, left knee: Secondary | ICD-10-CM | POA: Insufficient documentation

## 2015-11-17 DIAGNOSIS — M7040 Prepatellar bursitis, unspecified knee: Secondary | ICD-10-CM

## 2015-11-17 DIAGNOSIS — K219 Gastro-esophageal reflux disease without esophagitis: Secondary | ICD-10-CM | POA: Insufficient documentation

## 2015-11-17 MED ORDER — IBUPROFEN 200 MG TABLET
600.0000 mg | ORAL_TABLET | Freq: Three times a day (TID) | ORAL | Status: AC
Start: 2015-11-17 — End: 2015-12-01

## 2015-11-17 NOTE — Progress Notes (Signed)
ASSESSMENT:   PLAN:    ICD-10-CM    1. Prepatellar bursitis M70.40 Offered Pt  She wants to try ice at home and rest  Will reconnect with her orthopedist soon     2. Esophageal reflux K21.9 Continue pantoprozole  NSAID role discussed    3. 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    SUBJECTIVE:  [name] is a 60 y.o. female is here today for Epigastric Pain (follow up) and  Pain (above left knee)      She has GERD.  Symptoms dyspepsia especially when misses PPI doses  Treatment pantoprozole 40 mg daily --helps a lot  The patient denies nausea, vomiting and melena   Risk factors for GI irritation include NSAID use          Medication understanding addressed.      I have reviewed the patient's medical, surgical, family and social history in detail today, 11/17/2015  and updated the computerized patient record as appropriate.  has a current medication list which includes the following prescription(s): atenolol, azelaic acid, bupropion, clobetasol, fluorometholone, fluoxetine, fluoxetine, fluticasone, gabapentin, halobetasol propionate, ibuprofen, metronidazole, mineral oil-hydrophil petrolat, nystatin, pantoprazole, ranitidine, riboflavin (vitamin b2), and triamcinolone acetonide.   ROS: as above  OBJECTIVE:   Vitals:   Visit Vitals   . BP 115/83   . Pulse 79   . Temp 36.8 C (98.2 F) (Thermal Scan)   . Ht 1.658 m (5' 5.28")   . Wt 97.3 kg (214 lb 8.1 oz)   . SpO2 97%   . BMI 35.39 kg/m2      Appearance:in no apparent distress  Exam: Extremities has tenderness above left patella some fluid in patellar bursa, no redness, good range of motion in both knees  Abdomen soft nontender no HSM  [name], MD  [place]  [facility]  [place] [id number]  Dept: [phone]  Dept Fax:  [phone]

## 2015-11-27 ENCOUNTER — Encounter (HOSPITAL_COMMUNITY): Payer: Self-pay | Admitting: Psychiatry

## 2015-12-10 ENCOUNTER — Ambulatory Visit (HOSPITAL_BASED_OUTPATIENT_CLINIC_OR_DEPARTMENT_OTHER): Payer: MEDICAID | Admitting: OPHTHALMOLOGY

## 2015-12-10 ENCOUNTER — Telehealth (HOSPITAL_COMMUNITY): Payer: Self-pay | Admitting: Psychiatry

## 2015-12-10 ENCOUNTER — Ambulatory Visit
Admission: RE | Admit: 2015-12-10 | Discharge: 2015-12-10 | Disposition: A | Discharge status: Home / Self Care | Payer: MEDICAID | Source: Ambulatory Visit | Attending: OPHTHALMOLOGY | Admitting: OPHTHALMOLOGY

## 2015-12-10 DIAGNOSIS — H0012 Chalazion right lower eyelid: Secondary | ICD-10-CM

## 2015-12-10 DIAGNOSIS — H00019 Hordeolum externum unspecified eye, unspecified eyelid: Secondary | ICD-10-CM

## 2015-12-10 DIAGNOSIS — H01009 Unspecified blepharitis unspecified eye, unspecified eyelid: Secondary | ICD-10-CM

## 2015-12-10 DIAGNOSIS — Z85828 Personal history of other malignant neoplasm of skin: Secondary | ICD-10-CM | POA: Insufficient documentation

## 2015-12-10 DIAGNOSIS — H0019 Chalazion unspecified eye, unspecified eyelid: Secondary | ICD-10-CM

## 2015-12-10 DIAGNOSIS — H00012 Hordeolum externum right lower eyelid: Secondary | ICD-10-CM | POA: Insufficient documentation

## 2015-12-10 MED ORDER — TOBRAMYCIN-DEXAMETHASONE 0.3 %-0.1 % EYE OINTMENT
TOPICAL_OINTMENT | Freq: Every evening | OPHTHALMIC | 0 refills | Status: AC
Start: 2015-12-10 — End: 2015-12-17

## 2015-12-10 NOTE — Progress Notes (Addendum)
[facility]  Operated by [facility]  [address]  [place] [address]  Dept: [facility] Note    12/10/2015  [name],    Date of Birth:  November 05, 1955    Self, Referral  No address on file    Ophthalmologist/Optometrist:     SURGERY  Procedure:  Diagnosis:     ICD-10-CM    1. Stye H00.019 OPH EXTERNAL PHOTOS     Anesthesia:   Length of time:  minutes  Special Needs:       The patient is a 60 y.o. female here with complaint of:  Chief Complaint     Eye Problem        HPI     Pt is here for 9 mo follow up s/p  BLL elevation with ear cartilage. Pt states her vision is blurry.  Pt states it started off and on. PT states it started before he last surgery and after surgery it went away. Pt states this past week her OU have been blurry. Pt states she woke up this morning and her OD is red       Last edited by Cindra Presume, [place] on 12/10/2015  9:45 AM.         History of Sun Exposure: If yes:   Ptosis evaluation:     MD Addition to HPI: Pt is sp BLL elevation with ear cartilage (April 2016). She denies dryness of the eyes, but she reports blurriness of the right eye and RLL redness. She believes that her eyes are closing all the way while sleeping. She denies any eye pain or double vision.       Impression and Plan:  1. 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 Blepharitis with very mild RLL chalazion  - warm compresses and eyelid scrubs  - tobradex QHS for 1 week    RTC in 1 year, sooner PRN    [name], MD  12/10/2015, 11:02    I have reviewed and confirmed the ROS, PFSH, and exam elements performed and documented by the technician. The scribed portion of the progress note was scribed on my behalf and at my direction. I have reviewed and attest to the accuracy of the note.      [name], MD 12/10/2015, 11:02               Past Ocular history:  BLL elevation with ear cartilage     ROS     Positive for: Eyes (Blurry vision)    Negative for: Constitutional, Gastrointestinal, Neurological, Skin, Genitourinary, Musculoskeletal, HENT, Endocrine, Cardiovascular, Respiratory, Psychiatric, Allergic/Imm, Heme/Lymph    Last edited by Cindra Presume, COA on 12/10/2015  9:45 AM. (History)            Eye Examination:     Neuro/Psych     Oriented x3:  Yes    Mood/Affect:  Normal        Visual Acuity     Visual Acuity (Snellen - Linear)      Right Left   Dist cc 20/30 -1 20/25       Correction:  Glasses          Edited by: Cindra Presume, COA  Not recorded         Not recorded         Not recorded        Confrontational Visual Fields     Visual Fields (Counting fingers)      Right Left   Result Full Full             Edited by: Cindra Presume, COA            Pupils      Pupils APD   Right PERRL None   Left PERRL None           Extraocular Movement     Extraocular Movement      Right Left    0 0 0   0  0   0 0 0    0 0 0   0  0   0 0 0                Edited by: Pantojas, Kristy, COA               OD    OS     40    40      60       45    45     60    45    45        Eyelids:  Tr obicularis weakness on the right  RLL early chalazion  Blepharitis of all four lids    Lid edema [id number] OS: None Conj chem OD: None [id number]   Lid inject OD: trace OS: None Conj inject OD: None OS: None             PF   MRD                       LF   LC                       USS   LSS                 tr   LAG       tr       SLE  OD OS     Tear Assessment Above limbus Above limbus    [place] Tr inf PEE Tr inf PEE     AC D&Q D&Q    Iris intact intact    Lens 1-2+ NSC 1-2+ NSC       IOP straight Tonometry     Tonometry (Tonopen, 9:51 AM)      Right Left   Pressure 13 19             Edited by: Cindra Presume, COA                Upgaze     5 down        Fundus Dilated yes    Last Dilated 12/2014     OD  OS   Disc 0.4, no edema no pallor 0.4, no edema no pallor   Macula wnl wnl   Vessels wnl wnl   Periphery wnl wnl          Past Medical / Surgical / Family / Social History:      has a past medical history of Arthritis; Cancer ([place]); Depression; GERD (gastroesophageal reflux disease); Headache(784.0); Heart murmur (1979); Hyperlipidemia LDL goal < 100 (08/29/2013); Influenza-like illness; MINOR  CAD (coronary artery disease) (11/10/2012); Multinodular goiter; Rheumatic fever (16); Squamous cell carcinoma ([place]) (02/06/2015); Vaginal prolapse (2012); Wears dentures; and Wears glasses. She also has no past medical history of Allergic rhinitis; Anemia; Anesthesia complication; Angina at rest [facility]); Angina pectoris ([place]); Aortic aneurysm ([place]); Asthma; Asthma; Back problem; Blood transfusion, without reported diagnosis; Breast CA ([place]); Bruises easily; Cervical cancer ([place]); Chronic bronchitis ([place]); Clotting disorder ([place]); Cognitive dysfunction; Colon cancer ([place]); Congestive heart failure, unspecified; COPD (chronic obstructive pulmonary disease) ([place]); CVA (cerebrovascular accident) ([place]); Deep vein thrombosis (DVT) ([place]); Diabetes; Diabetes mellitus ([place]); Diabetes mellitus type II; Diabetes mellitus, type 1; Diabetes mellitus, type 2 ([place]); Difficult intubation; Difficult intubation; Difficulty waking; Disorder of liver; Endometrial cancer ([place]); H/O hearing loss; H/O urinary tract infection; Hearing loss; Hepatic cirrhosis ([place]); Hepatitis A virus infection; HTN; HTN (hypertension); breast cancer; Hypertension; Kidney disease; Loss of weight; Malignant hyperthermia; Myocardial infarction ([place]); Nausea with vomiting; Neck problem; Other convulsions; Ovarian cancer ([place]); Panic attack; Personal history of irradiation; Personal history of malignant neoplasm of prostate; PONV (postoperative nausea and vomiting); Pseudocholinesterase deficiency; Renal failure; Seizure ([place]); Seizures; Shortness of breath; Sleep apnea;  Spinal headache; STD (sexually transmitted disease); Stroke [facility]); Thyroid disease; Thyroid disorder; Treatment; Type 2 diabetes mellitus; Type 2 diabetes mellitus ([place]); Type I diabetes mellitus ([place]); Upper respiratory infection; Uterine cancer ([place]); Valvular disease; Viral hepatitis B; or Viral hepatitis C.    Past Surgical History:   Procedure Laterality Date    COLONOSCOPY  01/06/09    COLONOSCOPY performed by Loletta Parish F at [facility]  03/05/2010    GASTROSCOPY performed by Jocelyn Lamer at [facility]  03/05/2010    GASTROSCOPY WITH BIOPSY performed by Jocelyn Lamer at [facility]  2007    left distal fibula, casted    HX APPENDECTOMY      HX CHOLECYSTECTOMY      HX COLONOSCOPY      HX CYSTOCELE REPAIR  09/24/09    HX HAND SURGERY  2012    for Carpal tunnel     HX OOPHORECTOMY      left ovary removed    HX TONSILLECTOMY      HX TOTAL VAGINAL HYSTERECTOMY  1979    HX WRIST FRACTURE [place]  2007    [place] injury    PB REVISE ULNAR NERVE AT [place]    PB UPPER GI ENDOSCOPY,BIOPSY  12/19/07    patulous GE junction zone, erythema, nonerosive GERD           Family History   Problem Relation Age of Onset    Congestive Heart Failure Father 48     died at 11    Coronary Artery Disease Father     Heart Attack Father     Heart Attack Sister      90    Kidney Disease Sister      ESRD    Stroke Paternal Grandmother     Diabetes Mother     High Cholesterol Mother     Hypertension Mother     Coronary Artery Disease Mother 79     CABG    Thyroid Disease Sister     Hypertension Brother     Hypertension Sister     Hypertension Sister     Bipolar Disorder Daughter     Cancer Paternal Aunt  Breast Ca    Breast Cancer Paternal Aunt     Cancer Paternal Aunt      Breast Ca    Breast Cancer Paternal Aunt     Cancer Paternal Aunt      Breast Ca    Breast Cancer Paternal Aunt     Cancer Paternal Aunt       Breast Ca    Breast Cancer Paternal Aunt     Cancer Paternal Aunt      Breast Ca    Breast Cancer Paternal Aunt     Leukemia Paternal Uncle     Cancer Other 27     Breast Ca/Breast Ca    Breast Cancer Other     Leukemia Paternal Uncle            Social History   Substance Use Topics    Smoking status: Former Smoker     Packs/day: 2.00     Years: 40.00     Types: Cigarettes     Quit date: 01/22/2005    Smokeless tobacco: Never Used    Alcohol use No       I have seen and examined the above patient. I discussed the above diagnoses listed in the assessment and the above ophthalmic plan of care with the patient and patient's family. All questions were answered. I reviewed and, when necessary, made changes to the technician/resident note, documented ophthalmology exam, chief complaint, history of present illness, allergies, review of systems, past medical, past surgical, family and social history.    I scribed a portion of the encounter including Chief Complaint, HPI, Impression, Plan, and exam elements excluding visual acuity, lensometry, pupil assessment, confrontational visual fields, tonometry results, extraocular motility assessment, refractometry results, and assessment of mood and orientation. I scribed this note at the request of the physician who personally performed the services documented.    Jannifer Rodney, SCRIBE  12/10/2015, 10:02

## 2015-12-10 NOTE — Patient Instructions (Signed)
Warm compresses  Tobradex ointment at night for 1 week

## 2015-12-10 NOTE — Telephone Encounter (Signed)
[place] and left msg re physician to physician conversation regarding Prozac tabs.     Called pt and left a msg on her voice mail to update her on the process.     Karle Plumber, MD  12/10/2015, 16:26

## 2015-12-11 ENCOUNTER — Encounter (HOSPITAL_COMMUNITY): Payer: Self-pay | Admitting: Psychiatry

## 2015-12-11 ENCOUNTER — Ambulatory Visit (HOSPITAL_COMMUNITY): Payer: Self-pay | Admitting: Psychiatry

## 2015-12-11 NOTE — Telephone Encounter (Addendum)
Called pt. Left msg on identified voicemail.    Karle Plumber, MD  12/11/2015, 11:47      ----- Message from Floyce Stakes sent at 12/10/2015  4:33 PM EST -----  Hello,  Pt is returning your call.  Thanks!  Vickie S

## 2015-12-17 ENCOUNTER — Telehealth (HOSPITAL_COMMUNITY): Payer: Self-pay | Admitting: PSYCHIATRY

## 2015-12-17 NOTE — Telephone Encounter (Signed)
Covering for Dr Alfonso Patten.     [name] with patient at length about the current authorization for medication process.  Contacted patient's insurance company peer to peer provider however no one answered.    Contacted the patients pharmacy and they are willing to do a price match for the current medication dosage.  Prescription for Prozac 20 milligram tablets dispensed number 30 were called into the pharmacy.    Jeanella Flattery, DO  12/17/2015, 15:17

## 2015-12-26 ENCOUNTER — Encounter (HOSPITAL_COMMUNITY): Payer: MEDICAID | Admitting: Psychiatry

## 2016-01-12 ENCOUNTER — Other Ambulatory Visit (HOSPITAL_BASED_OUTPATIENT_CLINIC_OR_DEPARTMENT_OTHER): Payer: Self-pay | Admitting: Family Medicine

## 2016-01-12 DIAGNOSIS — H5713 Ocular pain, bilateral: Secondary | ICD-10-CM

## 2016-01-12 MED ORDER — ATENOLOL 25 MG TABLET
25.0000 mg | ORAL_TABLET | Freq: Every day | ORAL | 11 refills | Status: DC
Start: 2016-01-12 — End: 2016-02-21

## 2016-01-12 NOTE — Telephone Encounter (Signed)
Appt 02/16/16.

## 2016-01-13 ENCOUNTER — Other Ambulatory Visit (HOSPITAL_BASED_OUTPATIENT_CLINIC_OR_DEPARTMENT_OTHER): Payer: Self-pay | Admitting: Family Medicine

## 2016-01-14 MED ORDER — PANTOPRAZOLE 40 MG TABLET,DELAYED RELEASE
40.0000 mg | DELAYED_RELEASE_TABLET | Freq: Every morning | ORAL | 11 refills | Status: DC
Start: 2016-01-14 — End: 2016-10-22

## 2016-01-27 ENCOUNTER — Ambulatory Visit (HOSPITAL_BASED_OUTPATIENT_CLINIC_OR_DEPARTMENT_OTHER): Payer: MEDICAID

## 2016-01-27 ENCOUNTER — Other Ambulatory Visit (HOSPITAL_BASED_OUTPATIENT_CLINIC_OR_DEPARTMENT_OTHER): Payer: Self-pay | Admitting: Dermatology

## 2016-01-27 ENCOUNTER — Ambulatory Visit: Payer: MEDICAID | Attending: Dermatology | Admitting: Student in an Organized Health Care Education/Training Program

## 2016-01-27 VITALS — BP 123/69 | Ht 66.06 in | Wt 218.5 lb

## 2016-01-27 DIAGNOSIS — L719 Rosacea, unspecified: Secondary | ICD-10-CM | POA: Insufficient documentation

## 2016-01-27 DIAGNOSIS — I781 Nevus, non-neoplastic: Secondary | ICD-10-CM

## 2016-01-27 DIAGNOSIS — R238 Other skin changes: Secondary | ICD-10-CM

## 2016-01-27 DIAGNOSIS — L578 Other skin changes due to chronic exposure to nonionizing radiation: Secondary | ICD-10-CM

## 2016-01-27 DIAGNOSIS — Z85828 Personal history of other malignant neoplasm of skin: Secondary | ICD-10-CM | POA: Insufficient documentation

## 2016-01-27 DIAGNOSIS — L0102 Bockhart's impetigo: Secondary | ICD-10-CM

## 2016-01-27 DIAGNOSIS — Z808 Family history of malignant neoplasm of other organs or systems: Secondary | ICD-10-CM | POA: Insufficient documentation

## 2016-01-27 DIAGNOSIS — L989 Disorder of the skin and subcutaneous tissue, unspecified: Secondary | ICD-10-CM | POA: Insufficient documentation

## 2016-01-27 MED ORDER — SULFACETAMIDE SODIUM-SULFUR 10 %-5 % (W/W) TOPICAL CLEANSER
1.0000 | Freq: Every day | CUTANEOUS | 2 refills | Status: DC
Start: 2016-01-27 — End: 2016-01-30

## 2016-01-27 NOTE — Procedures (Addendum)
See progress note  Wilkins Elpers, MD

## 2016-01-27 NOTE — Progress Notes (Addendum)
Subjective:       Patient ID: [name] is a 60 y.o. female     Chief Complaint:     Chief Complaint   Patient presents with    Skin Check     6 month f/u        HPI  60 y.o. female with no personal hx of skin cancer with family history of NMSC as well as melanoma in her niece presents for a follow up.  Pt has been using metronidazole for rosacea but does not think this is helping. She complains of lesion on her left nose that will bleed occasionally and has been present for several months. She otherwise denies any new, changing, bleeding, or rapidly growing lesions and has no other skin-related complaints.    Pt has hx of dishydrotic eczema.She has a history of 1+ positive patch test to thimerosal and potassium dichromate.     Review of Systems   Constitutional: Negative for chills and fever.   Skin: Negative for rash.        - pruritus.     Current Outpatient Prescriptions   Medication Sig    atenolol (TENORMIN) 25 mg Oral Tablet Take 1 Tab (25 mg total) by mouth Once a day    Azelaic Acid 20 % Cream Apply topically Twice daily    buPROPion (WELLBUTRIN XL) 300 mg Oral Tablet Sustained Release 24 hr TAKE 1 TAB (300 MG TOTAL) BY MOUTH EVERY MORNING    Clobetasol (TEMOVATE) 0.05 % Ointment Apply topically Twice daily To rash on hands and feet    gabapentin (NEURONTIN) 300 mg Oral Capsule Take 2 Caps (600 mg total) by mouth Three times a day For 7 days then take 2 Cap ( 600 mg) TID    Halobetasol Propionate (ULTRAVATE) 0.05 % Cream Apply  topically. Three times a week    Metronidazole (METROCREAM) 0.75 % Cream Apply topically Twice daily    mineral oil-hydrophil petrolat (AQUAPHOR) Ointment by Apply Topically route Four times a day as needed    nystatin (NYSTOP) 100,000 unit/gram Powder Apply once or twice daily to PREVENT rash under breasts (Patient not taking: Reported on 01/27/2016)    pantoprazole (PROTONIX) 40 mg Oral Tablet, Delayed Release (E.C.) Take 1 Tab (40 mg total) by mouth Every  morning before breakfast    raNITIdine (ZANTAC) 15 mg/mL Oral Syrup Take 10 mL (150 mg total) by mouth Every night    riboflavin, vitamin B2, (VITAMIN B-2) 100 mg Oral Tablet Take 4 Tabs (400 mg total) by mouth Once a day    Sulfacetamide Sodium-Sulfur 10-5 % (w/w) Cleanser 1 Squirt by Apply externally route Once a day    triamcinolone acetonide (ARISTOCORT A) 0.1 % Ointment by Apply Topically route Twice daily       Objective:   Marland Kitchen     Vitals:    01/27/16 1043   BP: 123/69   Weight: 99.1 kg (218 lb 7.6 oz)   Height: 1.678 m (5' 6.06")     Physical Exam   Constitutional: She appears well-nourished. No distress.   Skin:          General skin exam was performed including head, neck, anterior and posterior trunk, bilateral upper, and lower extremities and revealed no areas of concern other than those documented.    Assessment & Plan:     Rosacea (#1 body)  - discontinue with metronidazole 1% apply daily  - D/c azealic acid topically daily  - Sulfur sulfacetimaide 5% -  10% apply daily  - Recommended using sunscreen with SPF at least 30 and re-apply every 2-3 hours.    Skin lesion most likely BCC vs rosacea (#2) left nose  - TIME OUT: A time out was performed to confirm the correct patient, procedure, and site. Consent obtained, area cleaned, and anesthetized. A shave biopsy was performed.  Aluminum chloride was used for hemostasis. Vaseline and bandage were placed over the wound and wound care instructions were given. Patient demonstrated understanding of the instructions. Patient was advised that it would take approximately 2-3 weeks for the pathology results to be available and that I will personally contact the patient at the number provided to further discuss the pathology results.  - Recommended using sunscreen with SPF at least 30 and re-apply every 2-3 hours.  - ABCDE's of melanoma (Asymmetry, Border irregularity, Color variation, Diameter (>6 mm), and Evolution of lesion) were reviewed with the patient.  Recommended monthly self-evaluation, photoprotection, and return to clinic with any new or changing moles.      Mannie Stabile, MD  01/27/2016, 13:58    See resident's note for details. I saw and examined the patient and agree with the resident's findings and plan as written except as noted and I was present and supervised/observed the entire procedure.    Bonnetta Barry, MD

## 2016-01-27 NOTE — Progress Notes (Signed)
01/27/16 1200   Medication Administration   Initials jm   Witness Initials jm   Medication  Lidocaine with Epi   Medication Dose 1cc   Route of Administration Intradermal   NDC # [id number]   LOT # [id number]   Expiration date 11/25/16   Manufacturer [employer]   Clinic Supplied Yes   Patient Supplied No   Comments: Admin. by Dr.Gwynne

## 2016-01-29 LAB — HISTORICAL SURGICAL PATHOLOGY SPECIMEN

## 2016-01-30 ENCOUNTER — Other Ambulatory Visit (HOSPITAL_BASED_OUTPATIENT_CLINIC_OR_DEPARTMENT_OTHER): Payer: Self-pay | Admitting: Student in an Organized Health Care Education/Training Program

## 2016-01-30 ENCOUNTER — Other Ambulatory Visit (HOSPITAL_BASED_OUTPATIENT_CLINIC_OR_DEPARTMENT_OTHER): Payer: Self-pay | Admitting: Dermatology

## 2016-01-30 DIAGNOSIS — L719 Rosacea, unspecified: Secondary | ICD-10-CM

## 2016-01-30 MED ORDER — SELENIUM SULFIDE 2.5 % LOTION
1.00 | TOPICAL_LOTION | CUTANEOUS | 2 refills | Status: DC
Start: 2016-01-30 — End: 2016-04-09

## 2016-01-30 MED ORDER — SULFACETAMIDE SODIUM-SULFUR 10 %-5 % (W/W) TOPICAL CLEANSER
1.0000 | Freq: Every day | CUTANEOUS | 2 refills | Status: DC
Start: 2016-01-30 — End: 2016-08-06

## 2016-01-30 NOTE — Progress Notes (Signed)
Rx for sodium sulfacetamide wash sent to pharmacy in place of selsun blue.    Dairl Ponder, MD

## 2016-02-12 ENCOUNTER — Encounter (HOSPITAL_BASED_OUTPATIENT_CLINIC_OR_DEPARTMENT_OTHER): Payer: Self-pay | Admitting: Student in an Organized Health Care Education/Training Program

## 2016-02-12 NOTE — Progress Notes (Signed)
Please call this patient and let them know the biopsy that was done on their nose was consistent with an infllammatory papule caused by rosacea. It is benign and no further treatment is recommended at this time   - MG            Called and gave patient above results per Dr. Raphael Gibney. Shela Nevin, [place]  02/12/2016, 13:20

## 2016-02-16 ENCOUNTER — Ambulatory Visit (HOSPITAL_BASED_OUTPATIENT_CLINIC_OR_DEPARTMENT_OTHER): Payer: MEDICAID | Admitting: Family Medicine

## 2016-02-16 ENCOUNTER — Ambulatory Visit
Admission: RE | Admit: 2016-02-16 | Discharge: 2016-02-16 | Disposition: A | Discharge status: Home / Self Care | Payer: MEDICAID | Source: Ambulatory Visit | Attending: Family Medicine | Admitting: Family Medicine

## 2016-02-16 ENCOUNTER — Encounter (HOSPITAL_BASED_OUTPATIENT_CLINIC_OR_DEPARTMENT_OTHER): Payer: Self-pay | Admitting: Family Medicine

## 2016-02-16 VITALS — BP 134/68 | HR 85 | Temp 97.7°F | Ht 66.06 in | Wt 222.7 lb

## 2016-02-16 DIAGNOSIS — R071 Chest pain on breathing: Secondary | ICD-10-CM

## 2016-02-16 DIAGNOSIS — R05 Cough: Secondary | ICD-10-CM

## 2016-02-16 DIAGNOSIS — R059 Cough, unspecified: Secondary | ICD-10-CM

## 2016-02-16 DIAGNOSIS — M79605 Pain in left leg: Secondary | ICD-10-CM

## 2016-02-16 MED ORDER — GABAPENTIN 300 MG CAPSULE
600.0000 mg | ORAL_CAPSULE | Freq: Three times a day (TID) | ORAL | 3 refills | Status: DC
Start: 2016-02-16 — End: 2016-06-21

## 2016-02-16 NOTE — Progress Notes (Signed)
ASSESSMENT:   PLAN:    ICD-10-CM    1. Cough R05 Likely chest wall pain related to cough  Tessalon Perles, fluids rest recommended  Will consider PFTs if cough persists more than a month, but may be viral syndrome   2. Chest pain made worse by breathing R07.1     left upper chest, only when laying down   3. Left leg pain M79.605 gabapentin (NEURONTIN) 300 mg Oral Capsule gave info about titrating up on the dose for sciatica into left leg  Regular walking  Discussed option to add PT if not improving       BMI addressed:   Advised on diet, weight loss, and exercise to reduce their above normal BMI.              Advised to call back directly if there are further questions, or if these symptoms fail to improve as anticipated or worsen.    SUBJECTIVE:  [name] is a 60 y.o. female is here today for FOLLOW UP MED CHECK      Chest pain  Pain in her left upper chest when she laid down  Feels like it is going through her  Laying down, very severe, pleuritic, got relief when sat up  Tender in her upper back  Has coughing for about a week, not painful to cough  Admitted 02/19/16 and discharged 02/21/16  for rule out MI and myocardial perfusion scan was negative.  (mild apical breast attenuation changes only).  Cardiac enzymes were negative and echo was also normal.  Ct scan showed now PE but had some tiny pulmonary nodules 3-4 mm that warrant follow up since she has a heavy prior smoking history --rescan in 12 months advised.      Pain left leg for about a week  She feels the pain in her posterior and lateral thigh  and extends down into her calf, not into her foot however.  Isn't noticing a lot of low back pain  Medication understanding addressed.      I have reviewed the patient's medical, surgical, family and social history in detail today, 02/16/2016  and updated the computerized patient record as appropriate.  has a current medication list which includes the following prescription(s): atenolol, azelaic acid,  bupropion, clobetasol, gabapentin, halobetasol propionate, metronidazole, mineral oil-hydrophil petrolat, nystatin, pantoprazole, ranitidine, riboflavin (vitamin b2), selenium sulfide, sulfacetamide sodium-sulfur, and triamcinolone acetonide.   ROS: as above  OBJECTIVE:   Vitals: BP 134/68  Pulse 85  Temp 36.5 C (97.7 F) (Thermal Scan)   Ht 1.678 m (5' 6.06")  Wt 101 kg (222 lb 10.6 oz)  SpO2 98%  BMI 35.87 kg/m2   Appearance:in no apparent distress  Exam: Lungs: clear to auscultation bilaterally, normal percussion bilaterally  Heart: regular rate and rhythm, S1, S2 normal, no murmur, click, rub or gallop  Chest wall with some tenderness along costochondral areas  Negative straight leg raise   Motor intact  DTR's normal  Tender over course of sciatic in buttock but no back tenderenss       [name], MD  [place]  Operated by [facility]  [place] [address]  Dept: [phone]  Dept Fax: [phone]

## 2016-02-19 ENCOUNTER — Emergency Department (EMERGENCY_DEPARTMENT_HOSPITAL): Payer: MEDICAID

## 2016-02-19 ENCOUNTER — Ambulatory Visit (HOSPITAL_BASED_OUTPATIENT_CLINIC_OR_DEPARTMENT_OTHER): Payer: Self-pay | Admitting: Family Medicine

## 2016-02-19 ENCOUNTER — Encounter (HOSPITAL_COMMUNITY): Payer: Self-pay

## 2016-02-19 ENCOUNTER — Observation Stay (HOSPITAL_BASED_OUTPATIENT_CLINIC_OR_DEPARTMENT_OTHER): Payer: MEDICAID | Admitting: Internal Medicine

## 2016-02-19 ENCOUNTER — Observation Stay
Admission: EM | Admit: 2016-02-19 | Discharge: 2016-02-21 | Disposition: A | Discharge status: Home / Self Care | Payer: MEDICAID | Attending: Internal Medicine | Admitting: Internal Medicine

## 2016-02-19 DIAGNOSIS — G43909 Migraine, unspecified, not intractable, without status migrainosus: Secondary | ICD-10-CM

## 2016-02-19 DIAGNOSIS — Z85828 Personal history of other malignant neoplasm of skin: Secondary | ICD-10-CM | POA: Insufficient documentation

## 2016-02-19 DIAGNOSIS — R0789 Other chest pain: Principal | ICD-10-CM | POA: Insufficient documentation

## 2016-02-19 DIAGNOSIS — K219 Gastro-esophageal reflux disease without esophagitis: Secondary | ICD-10-CM | POA: Insufficient documentation

## 2016-02-19 DIAGNOSIS — I2510 Atherosclerotic heart disease of native coronary artery without angina pectoris: Secondary | ICD-10-CM | POA: Insufficient documentation

## 2016-02-19 DIAGNOSIS — Z79899 Other long term (current) drug therapy: Secondary | ICD-10-CM | POA: Insufficient documentation

## 2016-02-19 DIAGNOSIS — R350 Frequency of micturition: Secondary | ICD-10-CM | POA: Insufficient documentation

## 2016-02-19 DIAGNOSIS — R062 Wheezing: Secondary | ICD-10-CM

## 2016-02-19 DIAGNOSIS — G629 Polyneuropathy, unspecified: Secondary | ICD-10-CM

## 2016-02-19 DIAGNOSIS — F329 Major depressive disorder, single episode, unspecified: Secondary | ICD-10-CM

## 2016-02-19 DIAGNOSIS — R7989 Other specified abnormal findings of blood chemistry: Secondary | ICD-10-CM

## 2016-02-19 DIAGNOSIS — R0781 Pleurodynia: Secondary | ICD-10-CM

## 2016-02-19 DIAGNOSIS — N762 Acute vulvitis: Secondary | ICD-10-CM | POA: Insufficient documentation

## 2016-02-19 DIAGNOSIS — L304 Erythema intertrigo: Secondary | ICD-10-CM | POA: Insufficient documentation

## 2016-02-19 DIAGNOSIS — I499 Cardiac arrhythmia, unspecified: Secondary | ICD-10-CM

## 2016-02-19 DIAGNOSIS — R079 Chest pain, unspecified: Secondary | ICD-10-CM

## 2016-02-19 DIAGNOSIS — Z8249 Family history of ischemic heart disease and other diseases of the circulatory system: Secondary | ICD-10-CM | POA: Insufficient documentation

## 2016-02-19 DIAGNOSIS — Z6836 Body mass index (BMI) 36.0-36.9, adult: Secondary | ICD-10-CM | POA: Insufficient documentation

## 2016-02-19 DIAGNOSIS — R3915 Urgency of urination: Secondary | ICD-10-CM | POA: Insufficient documentation

## 2016-02-19 DIAGNOSIS — R300 Dysuria: Secondary | ICD-10-CM | POA: Diagnosis present

## 2016-02-19 DIAGNOSIS — F419 Anxiety disorder, unspecified: Secondary | ICD-10-CM | POA: Insufficient documentation

## 2016-02-19 DIAGNOSIS — R0600 Dyspnea, unspecified: Secondary | ICD-10-CM

## 2016-02-19 DIAGNOSIS — Z7982 Long term (current) use of aspirin: Secondary | ICD-10-CM | POA: Insufficient documentation

## 2016-02-19 DIAGNOSIS — Z87891 Personal history of nicotine dependence: Secondary | ICD-10-CM | POA: Insufficient documentation

## 2016-02-19 DIAGNOSIS — R0602 Shortness of breath: Secondary | ICD-10-CM

## 2016-02-19 DIAGNOSIS — R918 Other nonspecific abnormal finding of lung field: Secondary | ICD-10-CM

## 2016-02-19 DIAGNOSIS — R001 Bradycardia, unspecified: Secondary | ICD-10-CM

## 2016-02-19 DIAGNOSIS — N6320 Unspecified lump in the left breast, unspecified quadrant: Secondary | ICD-10-CM

## 2016-02-19 DIAGNOSIS — M179 Osteoarthritis of knee, unspecified: Secondary | ICD-10-CM | POA: Insufficient documentation

## 2016-02-19 DIAGNOSIS — R791 Abnormal coagulation profile: Secondary | ICD-10-CM

## 2016-02-19 DIAGNOSIS — E669 Obesity, unspecified: Secondary | ICD-10-CM | POA: Insufficient documentation

## 2016-02-19 DIAGNOSIS — N63 Unspecified lump in breast: Secondary | ICD-10-CM

## 2016-02-19 DIAGNOSIS — E785 Hyperlipidemia, unspecified: Secondary | ICD-10-CM | POA: Insufficient documentation

## 2016-02-19 DIAGNOSIS — I10 Essential (primary) hypertension: Secondary | ICD-10-CM | POA: Insufficient documentation

## 2016-02-19 DIAGNOSIS — M549 Dorsalgia, unspecified: Secondary | ICD-10-CM

## 2016-02-19 HISTORY — DX: Chest pain, unspecified: R07.9

## 2016-02-19 LAB — URINALYSIS, MICROSCOPIC
RBCS: 0 /HPF (ref <6.0)
WBCS: 1 /HPF (ref <11.0)

## 2016-02-19 LAB — URINALYSIS, MACROSCOPIC
BLOOD: NEGATIVE mg/dL
COLOR: NORMAL
GLUCOSE: NEGATIVE mg/dL
GLUCOSE: NEGATIVE mg/dL
KETONES: NEGATIVE mg/dL
LEUKOCYTES: NEGATIVE WBCs/uL
NITRITE: NEGATIVE
PH: 7 (ref 5.0–8.0)
PROTEIN: NEGATIVE mg/dL
SPECIFIC GRAVITY: 1.034 — ABNORMAL HIGH (ref 1.005–1.030)
UROBILINOGEN: NEGATIVE mg/dL

## 2016-02-19 LAB — BASIC METABOLIC PANEL
ANION GAP: 8 mmol/L (ref 4–13)
BUN/CREA RATIO: 11 (ref 6–22)
BUN: 12 mg/dL (ref 8–25)
CALCIUM: 9.1 mg/dL (ref 8.5–10.4)
CHLORIDE: 108 mmol/L (ref 96–111)
CO2 TOTAL: 22 mmol/L (ref 22–32)
CO2 TOTAL: 22 mmol/L (ref 22–32)
CREATININE: 1.05 mg/dL (ref 0.49–1.10)
ESTIMATED GFR: 57 mL/min/1.73mˆ2 — ABNORMAL LOW (ref >59)
ESTIMATED GFR: 57 mL/min/{1.73_m2} — ABNORMAL LOW (ref >59)
GLUCOSE: 93 mg/dL (ref 65–139)
POTASSIUM: 3.9 mmol/L (ref 3.5–5.1)
SODIUM: 138 mmol/L (ref 136–145)

## 2016-02-19 LAB — THYROID STIMULATING HORMONE WITH FREE T4 REFLEX: TSH: 0.769 u[IU]/mL (ref 0.350–5.000)

## 2016-02-19 LAB — LIPID PANEL
CHOL/HDL RATIO: 6.7
CHOLESTEROL: 193 mg/dL (ref <200)
HDL CHOL: 29 mg/dL — ABNORMAL LOW (ref >=49)
LDL CALC: 125 mg/dL — ABNORMAL HIGH (ref <=100)
NON-HDL: 164 mg/dL (ref <=190)
TRIGLYCERIDES: 193 mg/dL — ABNORMAL HIGH (ref <=150)
VLDL CALC: 39 mg/dL — ABNORMAL HIGH (ref <30)

## 2016-02-19 LAB — CBC WITH DIFF
BASOPHIL #: 0.07 x10?3/uL (ref 0.00–0.20)
BASOPHIL #: 0.07 x10ˆ3/uL (ref 0.00–0.20)
BASOPHIL %: 1 %
EOSINOPHIL #: 0.2 x10ˆ3/uL (ref 0.00–0.50)
EOSINOPHIL %: 3 %
HCT: 42.6 % (ref 33.5–45.2)
HGB: 14.1 g/dL (ref 11.2–15.2)
LYMPHOCYTE #: 2.39 x10ˆ3/uL (ref 1.00–4.80)
LYMPHOCYTE %: 34 %
MCH: 28.7 pg (ref 27.4–33.0)
MCH: 28.7 pg (ref 27.4–33.0)
MCHC: 33.1 g/dL (ref 32.5–35.8)
MCV: 86.7 fL (ref 78.0–100.0)
MONOCYTE #: 0.69 x10ˆ3/uL (ref 0.30–1.00)
MONOCYTE %: 10 %
MPV: 7.2 fL — ABNORMAL LOW (ref 7.5–11.5)
NEUTROPHIL #: 3.7 x10ˆ3/uL (ref 1.50–7.70)
NEUTROPHIL %: 53 %
PLATELETS: 367 x10ˆ3/uL (ref 140–450)
RBC: 4.92 x10ˆ6/uL (ref 3.63–4.92)
RDW: 14.3 % (ref 12.0–15.0)
WBC: 7.1 x10ˆ3/uL (ref 3.5–11.0)

## 2016-02-19 LAB — VENOUS BLOOD GAS/K+/ICA++/CO-OX - INACTIVE
BASE EXCESS: 0.6 mmol/L (ref ?–3.0)
BICARBONATE (VENOUS): 25 mmol/L (ref 22.0–26.0)
CARBOXYHEMOGLOBIN: 1.9 % (ref 0.0–2.5)
HEMOGLOBIN: 13.9 g/dL (ref 12.0–18.0)
IONIZED CALCIUM: 1.19 mmol/L (ref 1.10–1.36)
MET-HEMOGLOBIN: 0.1 % (ref 0.0–3.5)
O2CT: 16.3 % (ref 6.7–17.5)
OXYHEMOGLOBIN: 83.5 % — ABNORMAL HIGH (ref 40.0–80.0)
PCO2 (VENOUS): 36 mmHg — ABNORMAL LOW (ref 41.00–51.00)
PH (VENOUS): 7.44 — ABNORMAL HIGH (ref 7.31–7.41)
PO2 (VENOUS): 43 mmHg (ref 35.0–50.0)
WHOLE BLOOD POTASSIUM: 3.9 mmol/L (ref 3.5–5.0)

## 2016-02-19 LAB — LIGHT GREEN TOP TUBE

## 2016-02-19 LAB — MAGNESIUM
MAGNESIUM: 2.4 mg/dL (ref 1.6–2.5)
MAGNESIUM: 2.3 mg/dL (ref 1.6–2.5)

## 2016-02-19 LAB — VENOUS BLOOD GAS/K+/ICA++/CO-OX
%FIO2 (VENOUS): 28 %
HEMOGLOBIN: 13.9 g/dL (ref 12.0–18.0)
PO2 (VENOUS): 43 mm/Hg (ref 35.0–50.0)
WHOLE BLOOD POTASSIUM: 3.9 mmol/L (ref 3.5–5.0)

## 2016-02-19 LAB — LIPASE: LIPASE: 56 U/L (ref 10–80)

## 2016-02-19 LAB — PT/INR
INR: 0.94 (ref 0.80–1.20)
PROTHROMBIN TIME: 10.6 s (ref 9.0–13.6)

## 2016-02-19 LAB — D-DIMER: D-DIMER: 502 ng/mL — ABNORMAL HIGH (ref <=232)

## 2016-02-19 LAB — B-TYPE NATRIURETIC PEPTIDE (BNP),PLASMA: BNP: <10 pg/mL (ref <=100)

## 2016-02-19 LAB — TROPONIN-I (FOR ED ONLY): TROPONIN I: 19 ng/L (ref 0–30)

## 2016-02-19 LAB — PTT (PARTIAL THROMBOPLASTIN TIME): APTT: 29.1 s (ref 25.1–36.5)

## 2016-02-19 LAB — GOLD TOP TUBE

## 2016-02-19 LAB — DARK GREEN TUBE

## 2016-02-19 LAB — TROPONIN-I
TROPONIN I: 18 ng/L (ref 0–30)
TROPONIN I: 13 ng/L (ref 0–30)
TROPONIN I: 13 ng/L (ref 0–30)

## 2016-02-19 LAB — LEGIONELLA URINE ANTIGEN: LEGIONELLA ANTIGEN: NEGATIVE

## 2016-02-19 LAB — STREPTOCOCCUS PNEUMONIAE ANTIGEN,URINE: S.PNEUMONIA ANTIGEN: NEGATIVE

## 2016-02-19 LAB — PHOSPHORUS
PHOSPHORUS: 2.9 mg/dL (ref 2.4–4.7)
PHOSPHORUS: 4.1 mg/dL (ref 2.4–4.7)

## 2016-02-19 LAB — LACTIC ACID LEVEL: LACTIC ACID: 1 mmol/L (ref 0.5–2.2)

## 2016-02-19 LAB — RED TOP TUBE

## 2016-02-19 MED ORDER — RIBOFLAVIN (VITAMIN B2) 100 MG TABLET
400.0000 mg | ORAL_TABLET | Freq: Every day | ORAL | Status: DC
Start: 2016-02-19 — End: 2016-02-21
  Administered 2016-02-19 – 2016-02-21 (×3): 400 mg via ORAL
  Filled 2016-02-19 (×3): qty 4

## 2016-02-19 MED ORDER — ASPIRIN 81 MG CHEWABLE TABLET
324.0000 mg | CHEWABLE_TABLET | ORAL | Status: AC
Start: 2016-02-19 — End: 2016-02-19
  Administered 2016-02-19: 324 mg via ORAL
  Filled 2016-02-19: qty 4

## 2016-02-19 MED ORDER — ALBUTEROL SULFATE 2.5 MG/3 ML (0.083 %) SOLUTION FOR NEBULIZATION
2.5000 mg | INHALATION_SOLUTION | RESPIRATORY_TRACT | Status: DC | PRN
Start: 2016-02-19 — End: 2016-02-21

## 2016-02-19 MED ORDER — ENOXAPARIN 40 MG/0.4 ML SUBCUTANEOUS SYRINGE
40.0000 mg | INJECTION | SUBCUTANEOUS | Status: DC
Start: 2016-02-19 — End: 2016-02-21
  Administered 2016-02-19 – 2016-02-20 (×2): 40 mg via SUBCUTANEOUS
  Filled 2016-02-19 (×3): qty 0

## 2016-02-19 MED ORDER — FAMOTIDINE 20 MG TABLET
20.00 mg | ORAL_TABLET | Freq: Two times a day (BID) | ORAL | Status: DC
Start: 2016-02-19 — End: 2016-02-21
  Administered 2016-02-19 – 2016-02-21 (×4): 20 mg via ORAL
  Filled 2016-02-19 (×5): qty 1

## 2016-02-19 MED ORDER — SODIUM CHLORIDE 0.9 % (FLUSH) INJECTION SYRINGE
2.00 mL | INJECTION | INTRAMUSCULAR | Status: DC | PRN
Start: 2016-02-19 — End: 2016-02-21

## 2016-02-19 MED ORDER — TRAMADOL 50 MG TABLET
50.0000 mg | ORAL_TABLET | Freq: Four times a day (QID) | ORAL | Status: DC | PRN
Start: 2016-02-19 — End: 2016-02-21
  Administered 2016-02-19 – 2016-02-20 (×3): 50 mg via ORAL
  Filled 2016-02-19 (×3): qty 1

## 2016-02-19 MED ORDER — GABAPENTIN 300 MG CAPSULE
300.00 mg | ORAL_CAPSULE | Freq: Three times a day (TID) | ORAL | Status: DC
Start: 2016-02-19 — End: 2016-02-21
  Administered 2016-02-19: 300 mg via ORAL
  Administered 2016-02-19: 0 mg via ORAL
  Administered 2016-02-20 – 2016-02-21 (×4): 300 mg via ORAL
  Filled 2016-02-19 (×8): qty 1

## 2016-02-19 MED ORDER — NYSTATIN 100,000 UNIT/GRAM TOPICAL POWDER
Freq: Two times a day (BID) | CUTANEOUS | Status: DC
Start: 2016-02-19 — End: 2016-02-21
  Filled 2016-02-19: qty 15

## 2016-02-19 MED ORDER — FENTANYL (PF) 50 MCG/ML INJECTION SOLUTION
INTRAMUSCULAR | Status: AC
Start: 2016-02-19 — End: 2016-02-19
  Administered 2016-02-19: 50 ug via INTRAVENOUS
  Filled 2016-02-19: qty 2

## 2016-02-19 MED ORDER — GABAPENTIN 300 MG CAPSULE
300.0000 mg | ORAL_CAPSULE | Freq: Every evening | ORAL | Status: DC
Start: 2016-02-19 — End: 2016-02-21
  Administered 2016-02-19 – 2016-02-20 (×2): 0 mg via ORAL
  Filled 2016-02-19 (×3): qty 1

## 2016-02-19 MED ORDER — ASPIRIN 81 MG CHEWABLE TABLET
81.0000 mg | CHEWABLE_TABLET | Freq: Every day | ORAL | Status: DC
Start: 2016-02-19 — End: 2016-02-21
  Administered 2016-02-19 – 2016-02-21 (×3): 81 mg via ORAL
  Filled 2016-02-19 (×3): qty 1

## 2016-02-19 MED ORDER — BUPROPION HCL SR 100 MG TABLET,12 HR SUSTAINED-RELEASE
200.0000 mg | ORAL_TABLET | Freq: Every day | ORAL | Status: DC
Start: 2016-02-19 — End: 2016-02-19

## 2016-02-19 MED ORDER — ATENOLOL 25 MG TABLET
25.0000 mg | ORAL_TABLET | Freq: Every day | ORAL | Status: DC
Start: 2016-02-19 — End: 2016-02-19
  Filled 2016-02-19: qty 1

## 2016-02-19 MED ORDER — BUPROPION HCL SR 150 MG TABLET,12 HR SUSTAINED-RELEASE
300.0000 mg | ORAL_TABLET | Freq: Every day | ORAL | Status: DC
Start: 2016-02-19 — End: 2016-02-21
  Administered 2016-02-19: 0 mg via ORAL
  Administered 2016-02-20 – 2016-02-21 (×2): 300 mg via ORAL
  Filled 2016-02-19 (×3): qty 2

## 2016-02-19 MED ORDER — SODIUM CHLORIDE 0.9 % (FLUSH) INJECTION SYRINGE
2.0000 mL | INJECTION | INTRAMUSCULAR | Status: DC | PRN
Start: 2016-02-19 — End: 2016-02-21

## 2016-02-19 MED ORDER — NITROGLYCERIN 0.4 MG SUBLINGUAL TABLET
0.40 mg | SUBLINGUAL_TABLET | SUBLINGUAL | Status: DC | PRN
Start: 2016-02-19 — End: 2016-02-21
  Filled 2016-02-19: qty 25

## 2016-02-19 MED ORDER — FENTANYL (PF) 50 MCG/ML INJECTION SOLUTION
50.00 ug | INTRAMUSCULAR | Status: AC
Start: 2016-02-19 — End: 2016-02-19
  Administered 2016-02-19: 50 ug via INTRAVENOUS
  Filled 2016-02-19: qty 2

## 2016-02-19 MED ORDER — RANITIDINE 15 MG/ML ORAL SYRUP
150.00 mg | ORAL_SOLUTION | Freq: Every evening | ORAL | Status: DC
Start: 2016-02-19 — End: 2016-02-19

## 2016-02-19 MED ORDER — PERFLUTREN LIPID MICROSPHERES 1.1 MG/ML INTRAVENOUS SUSPENSION
0.30 mL | INTRAVENOUS | Status: AC
Start: 2016-02-19 — End: 2016-02-20
  Administered 2016-02-20: 0.3 mL via INTRAVENOUS

## 2016-02-19 MED ORDER — PANTOPRAZOLE 40 MG TABLET,DELAYED RELEASE
40.00 mg | DELAYED_RELEASE_TABLET | Freq: Every morning | ORAL | Status: DC
Start: 2016-02-20 — End: 2016-02-21
  Administered 2016-02-20 – 2016-02-21 (×2): 40 mg via ORAL
  Filled 2016-02-19 (×2): qty 1

## 2016-02-19 MED ORDER — FAMOTIDINE (PF) 20 MG/2 ML INTRAVENOUS SOLUTION
20.0000 mg | INTRAVENOUS | Status: AC
Start: 2016-02-19 — End: 2016-02-19
  Administered 2016-02-19: 20 mg via INTRAVENOUS

## 2016-02-19 MED ORDER — IOPAMIDOL 370 MG IODINE/ML (76 %) INTRAVENOUS SOLUTION
80.00 mL | INTRAVENOUS | Status: AC
Start: 2016-02-19 — End: 2016-02-19
  Administered 2016-02-19: 13:00:00 80 mL via INTRAVENOUS

## 2016-02-19 MED ORDER — SODIUM CHLORIDE 0.9 % (FLUSH) INJECTION SYRINGE
2.00 mL | INJECTION | Freq: Three times a day (TID) | INTRAMUSCULAR | Status: DC
Start: 2016-02-19 — End: 2016-02-21
  Administered 2016-02-19: 0 mL
  Administered 2016-02-20: 2 mL
  Administered 2016-02-20 – 2016-02-21 (×3): 0 mL

## 2016-02-19 MED ORDER — SODIUM CHLORIDE 0.9 % (FLUSH) INJECTION SYRINGE
2.0000 mL | INJECTION | Freq: Three times a day (TID) | INTRAMUSCULAR | Status: DC
Start: 2016-02-19 — End: 2016-02-21
  Administered 2016-02-19 – 2016-02-21 (×6): 2 mL

## 2016-02-19 MED ORDER — FENTANYL (PF) 50 MCG/ML INJECTION SOLUTION
50.0000 ug | INTRAMUSCULAR | Status: AC
Start: 2016-02-19 — End: 2016-02-19

## 2016-02-19 MED ORDER — ATROPINE 0.4 MG/ML INJECTION SOLUTION
0.40 mg | Freq: Once | INTRAMUSCULAR | Status: DC | PRN
Start: 2016-02-19 — End: 2016-02-21
  Filled 2016-02-19: qty 1

## 2016-02-19 MED ORDER — GI COCKTAIL (ANTACID SUSP, LIDOCAINE)
15.0000 mL | Status: AC
Start: 2016-02-19 — End: 2016-02-19
  Administered 2016-02-19: 15 mL via ORAL
  Filled 2016-02-19: qty 15

## 2016-02-19 MED ORDER — SELENIUM SULFIDE 2.5 % LOTION
1.00 | TOPICAL_LOTION | CUTANEOUS | Status: DC
Start: 2016-02-20 — End: 2016-02-21
  Administered 2016-02-20: 0 via TOPICAL
  Filled 2016-02-19: qty 120

## 2016-02-19 MED ADMIN — lactated Ringers intravenous solution: ORAL | @ 19:00:00 | NDC 00338011704

## 2016-02-19 MED ADMIN — morphine 1 mg/mL in 0.9 % sodium chloride intravenous: ORAL | @ 13:00:00

## 2016-02-19 MED ADMIN — sodium chloride 0.9 % (flush) injection syringe: @ 14:00:00

## 2016-02-19 NOTE — Nurses Notes (Signed)
EKG shows changes from last EKG. Service aware.

## 2016-02-19 NOTE — ED Nurses Note (Signed)
Consulting service at the bedside.

## 2016-02-19 NOTE — Telephone Encounter (Signed)
Patient called in with active chest pain, shortness of breath with radiating next pain. This was sudden onset this morning.   Patient did not have a ride to the emergency department I advised patient to call 911 for immediate assistance and ambulance transport.  Cassie Freer, RN

## 2016-02-19 NOTE — ED Attending Note (Signed)
Critical Care time billed for this visit is 0 min (exclusive of procedure time)    Note begun by: Pete Glatter, MD on 02/19/2016 at 11:47 AM    History Limitation:  none    Attestation:  I was physically present and directly supervised this patient's care.  Patient seen and examined.  Resident / Aron Baba / NP history and exam reviewed.   Key elements in addition to and/or correction of that documentation are as follows:    HPI :    60 y.o. y.o. female presents with chief complaint of chest pain.  Substernal, epigastric.  Sharp.  9/10.  2 hour duration.  Radiates to neck/back.  Worse with deep inspiration.  Worse with lying flat, improved if upright.     PMHx:  Hyperlipidemia  CAD (2014)  Depression  Squamous Cell Carcinoma (parotid gland)  Migraines -- on Atenolol    PE :   VS on presentation: Blood pressure (!) 160/84, pulse 80, temperature 37.2 C (99 F), resp. rate 18, height 1.651 m (5\' 5" ), weight 97.1 kg (214 lb), SpO2 96 %.  Per midlevel provider or resident note    Data/Test :    Pulse-Ox:  Reviewed; Oxygen therapy needed? no  EKG : sinus at 50 bpm, normal axis  Images Personally Reviewed : None  Image Reports Reviewed:    CT CHEST FOR PULMONARY EMBOLUS W IV CONTRAST   Final Result   1.  No evidence of PE or other acute thoracic abnormality.   2.  Nonspecific small subcentimeter pulmonary nodules measuring 3-4 mm.     If patient is considered at high risk for these nodules being malignant,    consider followup chest CT in 12 months.  If low risk, no followup is    required.   3.  Partially-imaged left breast mass.  This likely corresponds to the    cyst seen on breast ultrasound in November 2015.            XR AP MOBILE CHEST   Final Result    No acute thoracic abnormality.              Labs:  Laboratory values obtained as documented in the patients chart and the resident note.  Notable lab values:   Labs Reviewed   VENOUS BLOOD GAS/K+/ICA++/CO-OX - Abnormal; Notable for the following:        Result Value       PH 7.44 (*)     PCO2 36.00 (*)     OXYHEMOGLOBIN 83.5 (*)     All other components within normal limits   BASIC METABOLIC PANEL - Abnormal; Notable for the following:     ESTIMATED GFR 57 (*)     All other components within normal limits   CBC WITH DIFF - Abnormal; Notable for the following:     MPV 7.2 (*)     All other components within normal limits   D-DIMER - Abnormal; Notable for the following:     D-DIMER 502 (*)     All other components within normal limits    Narrative:     Normal D-dimer results have a high negative predictive value for excluding DVT or PE. Results should be interpreted in conjunction with the clinical pre-test probability of venous thromboembolism.   PT/INR - Normal    Narrative:     Coumadin therapy INR range for Conventional Anticoagulation is 2.0 to 3.0 and for Intensive Anticoagulation 2.5 to 3.5.   PTT (PARTIAL THROMBOPLASTIN  TIME) - Normal    Narrative:     Therapeutic range for unfractionated heparin is 60.0-100.0 seconds.   TROPONIN-I (FOR ED ONLY) - Normal   LACTIC ACID - Normal   MAGNESIUM - Normal   PHOSPHORUS - Normal   THYROID STIMULATING HORMONE WITH FREE T4 REFLEX - Normal   B-TYPE NATRIURETIC PEPTIDE - Normal   LIPASE - Normal   ADULT ROUTINE BLOOD CULTURE, SET OF 2 BOTTLES (BACTERIA AND YEAST)   ADULT ROUTINE BLOOD CULTURE, SET OF 2 BOTTLES (BACTERIA AND YEAST)   STREPTOCOCCUS PNEUMONIAE ANTIGEN,URINE   LEGIONELLA URINE ANTIGEN   CBC/DIFF    Narrative:     The following orders were created for panel order CBC/DIFF.  Procedure                               Abnormality         Status                     ---------                               -----------         ------                     CBC WITH [id number]                Abnormal            Final result                 Please view results for these tests on the individual orders.   RAINBOW DRAW - RUBY ONLY    Narrative:     The following orders were created for panel order [place].  Procedure                                Abnormality         Status                     ---------                               -----------         ------                     GOLD TOP [id number]                                    In process                 RED TOP [id number]                                     In process                 LIGHT GREEN TOP [id number]  In process                 [place] [id number]                                  In process                 BLOOD BANK HOLD [id number]                             In process                   Please view results for these tests on the individual orders.   GOLD TOP TUBE   RED TOP TUBE   LIGHT GREEN TOP TUBE   DARK GREEN TUBE   URINALYSIS, MACROSCOPIC AND MICROSCOPIC W/CULTURE REFLEX    Narrative:     The following orders were created for panel order URINALYSIS, MACROSCOPIC AND MICROSCOPIC W/CULTURE REFLEX.  Procedure                               Abnormality         Status                     ---------                               -----------         ------                     URINALYSIS, MACROSCOPIC[[id number]]                                                       Please view results for these tests on the individual orders.   TROPONIN-I   TROPONIN-I   LIPID PANEL   HGA1C (HEMOGLOBIN A1C WITH EST AVG GLUCOSE)   MAGNESIUM   PHOSPHORUS   URINALYSIS, MACROSCOPIC   INFLUENZA VIRUS TYPE A AND TYPE B, AND RESPIRATORY SYNCYTIAL VIRUS (RSV), PCR   PARAINFLUENZA VIRUS (1,2,3), PCR   HUMAN METAPNEUMOVIRUS (HMPV), PCR   PROCALCITONIN, SERUM   BLOOD BANK HOLD TUBE       Review of Prior Data :       Prior Images : None  Prior EKG : None  Online Medical Records:  None  Transfer Docs/Images:  None    Initial Assessment:  Chest Pain    MDM/Plan:    Labs, EKG, CXR. BP bilaterally, ASA, Pepcid  Wells Criteria for PE  Clinical S/Sx DVT NO (+3);  PE most/equally likely Dx NO (+3);  HR > 100 NO (+1.5);  Immobilization atleast 3 days or  surgery in previous 4 weeks NO (+1.5);  Prior DVT/PE NO (+1.5);  Hemoptysis NO (+1);  Malignancy Tx w/in 6 mo 1 (+1)  Wells PE Score = 1    HEART SCORE = 3    ED Course:     12:46 EKG ok.  Trop OK.  BP symmetric both UE.  D dimer elevated.        PROCEDURES: none  Disposition: Admit    Clinical Impression:     Encounter Diagnoses   Name Primary?    Chest pain, unspecified type Yes    Dyspnea     Pulmonary nodules     Left breast mass     Positive D dimer        CRITICAL CARE : none (exclusive of procedure time)    This note was completed after the conclusion of care given the need for direct patient care at the time of service.

## 2016-02-19 NOTE — H&P (Addendum)
[facility]   General Medicine   Admission H&P    Date of Service:  02/19/2016  [name], [name], 60 y.o. female  Date of Admission:  02/19/2016  Date of Birth:  [birth year]  PCP: [name], MD    Information Obtained from: patient and history reviewed via medical record    Chief Complaint:  Chest Pain    HPI:   [name] is a 60 y.o., White female who presents with c/o acute onset pleuritic chest pain (10/10 sharp sensation that radiated to left side of her neck lasting a couple of seconds then drops to 5/10 pressure-like sensation, improved when sitting up, worse with movement & any type of exertion) associated with dyspnea, orthopnea, night sweat, dry cough x2 days, N/V (x1 episode, clear liquid). Denies fever, chills, PND, weight gain, LE edema, hemoptysis, sick contact, rhinorrhea, sore throat, congestion, recent illness, recent Abx use, recent travel     She reports having several family members have had early CAD (sister at 34, dad at 74 & passed away d/t ischemic CM, mom also had CAD but Dx at 60). Denies current cigarette use but previously smoked 1ppd x 30 yrs (quit 10 yrs ago). Denies h/o DM, HTN    She normally does not uses supplemental O2 at home but is requiring 2L via NC in the ED. She was given ASA 322m, SL nitroglycerine, Pepcid, GI cocktail, & fentanyl while in the ED. Pt reports that only the fentanyl alleviated her pain    She also reports dysuria a/w urinary frequency & urgency x 2-3 days. Denies any associated hematuria, malodor     Aside from the above stated findings:   - ROS (+) for: chronic arthralgia (mostly left knee)   - ROS (-) for: abd pain, diarrhea, constipation, rash    Past medical History    Past Medical History:   Diagnosis Date    Arthritis     Depression     GERD (gastroesophageal reflux disease)     controlled w/med    Heart murmur 1979    benign    Hyperlipidemia LDL goal < 100 08/29/2013    Influenza-like illness     MINOR  CAD (coronary artery disease) 11/10/2012    no treatment other than cholesterol medications; follows regularly with Dr. FRodney Booze   Multinodular goiter     Rheumatic fever 16    no complications    Squamous cell carcinoma ([place] 02/06/2015    Vaginal prolapse 2012    surgery improved significantly        Past Surgical History:   Procedure Laterality Date    COLONOSCOPY  01/06/09    [facility]    GASTROSCOPY [facility] 03/05/2010    GASTROSCOPY WITH BIOPSY [place] [place] 2007    left distal fibula, casted    HX APPENDECTOMY      HX CHOLECYSTECTOMY      HX COLONOSCOPY      HX CYSTOCELE REPAIR  09/24/09    HX HAND SURGERY  2012    for Carpal tunnel     HX OOPHORECTOMY      left ovary removed    HX TONSILLECTOMY  HX TOTAL VAGINAL HYSTERECTOMY  1979    HX WRIST FRACTURE [place]  2007    [place] injury    PB REVISE ULNAR NERVE AT [place]BOW  1979    PB UPPER GI ENDOSCOPY,BIOPSY  12/19/07    patulous GE junction zone, erythema, nonerosive GERD            Home Medications  Medications Prior to Admission     Prescriptions    atenolol (TENORMIN) 25 mg Oral Tablet    Take 1 Tab (25 mg total) by mouth Once a day    Azelaic Acid 20 % Cream    Apply topically Twice daily    buPROPion (WELLBUTRIN XL) 300 mg Oral Tablet Sustained Release 24 hr    TAKE 1 TAB (300 MG TOTAL) BY MOUTH EVERY MORNING    Clobetasol (TEMOVATE) 0.05 % Ointment    Apply topically Twice daily To rash on hands and feet    gabapentin (NEURONTIN) 300 mg Oral Capsule    Take 2 Caps (600 mg total) by mouth Three times a day    Halobetasol Propionate (ULTRAVATE) 0.05 % Cream    Apply  topically. Three times a week    Metronidazole (METROCREAM) 0.75 % Cream    Apply topically Twice daily    mineral oil-hydrophil petrolat (AQUAPHOR) Ointment    by Apply  Topically route Four times a day as needed    nystatin (NYSTOP) 100,000 unit/gram Powder    Apply once or twice daily to PREVENT rash under breasts    pantoprazole (PROTONIX) 40 mg Oral Tablet, Delayed Release (E.C.)    Take 1 Tab (40 mg total) by mouth Every morning before breakfast    raNITIdine (ZANTAC) 15 mg/mL Oral Syrup    Take 10 mL (150 mg total) by mouth Every night    riboflavin, vitamin B2, (VITAMIN B-2) 100 mg Oral Tablet    Take 4 Tabs (400 mg total) by mouth Once a day    selenium sulfide (SELSUN) 2.5 % Lotion    1 Squirt by Apply Topically route Every 7 days    Sulfacetamide Sodium-Sulfur 10-5 % (w/w) Cleanser    1 Squirt by Apply externally route Once a day    triamcinolone acetonide (ARISTOCORT A) 0.1 % Ointment    by Apply Topically route Twice daily           Allergies   Allergies   Allergen Reactions    Cymbalta [Duloxetine]      Rash on feet and hands    Blue Dye      Told to avoid due to patch test result by Simone Curia    Lipitor [Atorvastatin]      Muscle pain    Nickel     Codeine Itching     RASH    Mobic [Meloxicam] Nausea/ Vomiting    Naprosyn [Naproxen] Nausea/ Vomiting       Social History   Social History     Social History    Marital status: Divorced     Spouse name: N/A    Number of children: N/A    Years of education: N/A     Occupational History    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 Employed     occasionally     Social History Main Topics    Smoking status: Former Smoker     Packs/day: 2.00     Years: 40.00     Types: Cigarettes     Quit date: 01/22/2005    Smokeless tobacco: Never  Used    Alcohol use No    Drug use: No    Sexual activity: Not on file     Other Topics Concern    Uses Cane No    Uses Walker No    Uses Wheelchair No    Right Hand Dominant Yes    Left Hand Dominant No    Ambidextrous No    Ability To Walk 1 Flight Of Steps Without Sob/Cp Yes    Routine Exercise Yes     walk    Ability To Walk 2 Flight Of Steps Without Sob/Cp Yes    Ability To Do Own  Adl's Yes    Shift Work No    Unusual Sleep-Wake Schedule No     Social History Narrative       Family History  Family History   Problem Relation Age of Onset    Congestive Heart Failure Father 61     died at 44    Coronary Artery Disease Father     Heart Attack Father     Heart Attack Sister      47    Kidney Disease Sister      ESRD    Stroke Paternal Grandmother     Diabetes Mother     High Cholesterol Mother     Hypertension Mother     Coronary Artery Disease Mother 68     CABG    Thyroid Disease Sister     Hypertension Brother     Hypertension Sister     Hypertension Sister     Bipolar Disorder Daughter     Cancer Paternal Aunt      Breast Ca    Breast Cancer Paternal Aunt     Cancer Paternal Aunt      Breast Ca    Breast Cancer Paternal Aunt     Cancer Paternal Aunt      Breast Ca    Breast Cancer Paternal Aunt     Cancer Paternal Aunt      Breast Ca    Breast Cancer Paternal Aunt     Cancer Paternal Aunt      Breast Ca    Breast Cancer Paternal Aunt     Leukemia Paternal Uncle     Cancer Other 64     Breast Ca/Breast Ca    Breast Cancer Other     Leukemia Paternal Uncle          ROS: Other than ROS in the HPI, all other systems were negative     Physical Examination:   Temperature: 37.2 C (99 F) Heart Rate: 55 BP (Non-Invasive): 106/67   Respiratory Rate: (!) 11 SpO2-1: 95 % Pain Score (Numeric, Faces): 8   General: obese female in mild distress due to pain  HEENT: Head NCAT, no scleral icterus, no conjunctival injection, MMM, no oropharyngeal injection or exudates, no tonsillar enlargement  Neck: supple, FROM, non-palpable thyroid, no LAD, no JVD   Resp: normal WOB, distant breath sounds but auscultated wheezing throughout all lung fields, no crackles or rhonchi appreciated  CV   Chest Wall: RRR, nlS1S2, no m/r/g, questionable TTP (pt winced and said "ouch" when sternum was palpated but quickly said "no it doesn't hurt when you push on it")  GI: +BS, soft, NT, protuberant, no  CVA tenderness  Ext: dry, warm, no c/c/e   Neuro: A&Ox3, no focal defecits appreciated    Skin: intact, no erythema, no rash/lesions noted     Labs:  I have reviewed all lab results.  Lab Results for Last 24 Hours:    Results for orders placed or performed during the hospital encounter of 02/19/16 (from the past 24 hour(s))   BASIC METABOLIC PANEL, NON-FASTING   Result Value Ref Range    SODIUM 138 136 - 145 mmol/L    POTASSIUM 3.9 3.5 - 5.1 mmol/L    CHLORIDE 108 96 - 111 mmol/L    CO2 TOTAL 22 22 - 32 mmol/L    ANION GAP 8 4 - 13 mmol/L    CALCIUM 9.1 8.5 - 10.4 mg/dL    GLUCOSE 93 65 - 139 mg/dL    BUN 12 8 - 25 mg/dL    CREATININE 1.05 0.49 - 1.10 mg/dL    BUN/CREA RATIO 11 6 - 22    ESTIMATED GFR 57 (L) >59 mL/min/1.27m   PT/INR   Result Value Ref Range    PROTHROMBIN TIME 10.6 9.0 - 13.6 seconds    INR 0.94 0.80 - 1.20   PTT (PARTIAL THROMBOPLASTIN TIME)   Result Value Ref Range    APTT 29.1 25.1 - 36.5 seconds   TROPONIN-I (FOR ED ONLY)   Result Value Ref Range    TROPONIN I 19 0 - 30 ng/L   CBC WITH DIFF   Result Value Ref Range    WBC 7.1 3.5 - 11.0 x103/uL    RBC 4.92 3.63 - 4.92 x106/uL    HGB 14.1 11.2 - 15.2 g/dL    HCT 42.6 33.5 - 45.2 %    MCV 86.7 78.0 - 100.0 fL    MCH 28.7 27.4 - 33.0 pg    MCHC 33.1 32.5 - 35.8 g/dL    RDW 14.3 12.0 - 15.0 %    PLATELETS 367 140 - 450 x103/uL    MPV 7.2 (L) 7.5 - 11.5 fL    NEUTROPHIL % 53 %    LYMPHOCYTE % 34 %    MONOCYTE % 10 %    EOSINOPHIL % 3 %    BASOPHIL % 1 %    NEUTROPHIL # 3.70 1.50 - 7.70 x103/uL    LYMPHOCYTE # 2.39 1.00 - 4.80 x103/uL    MONOCYTE # 0.69 0.30 - 1.00 x103/uL    EOSINOPHIL # 0.20 0.00 - 0.50 x103/uL    BASOPHIL # 0.07 0.00 - 0.20 x103/uL   MAGNESIUM   Result Value Ref Range    MAGNESIUM 2.4 1.6 - 2.5 mg/dL   PHOSPHORUS   Result Value Ref Range    PHOSPHORUS 2.9 2.4 - 4.7 mg/dL   THYROID STIMULATING HORMONE WITH FREE T4 REFLEX   Result Value Ref Range    TSH 0.769 0.350 - 5.000 uIU/mL   D-DIMER   Result Value Ref Range     D-DIMER 502 (H) <=232 ng/mL DDU   LIPASE   Result Value Ref Range    LIPASE 56 10 - 80 U/L   VENOUS BLOOD GAS/K+/ICA++/CO-OX   Result Value Ref Range    %FIO2 28.0 %    PH 7.44 (H) 7.31 - 7.41    PCO2 36.00 (L) 41.00 - 51.00 mm/Hg    PO2 43.0 35.0 - 50.0 mm/Hg    BASE EXCESS 0.6 -3.0 - 3.0 mmol/L    BICARBONATE 25.0 22.0 - 26.0 mmol/L    IONIZED CALCIUM 1.19 1.10 - 1.36 mmol/L    WHOLE BLOOD POTASSIUM 3.9 3.5 - 5.0 mmol/L    OXYHEMOGLOBIN 83.5 (H) 40.0 - 80.0 %    CARBOXYHEMOGLOBIN 1.9  0.0 - 2.5 %    MET-HEMOGLOBIN 0.1 0.0 - 3.5 %    HEMOGLOBIN 13.9 12.0 - 18.0 g/dL    O2CT 16.3 6.7 - 17.5 %   LACTIC ACID   Result Value Ref Range    LACTIC ACID 1.0 0.5 - 2.2 mmol/L   B-TYPE NATRIURETIC PEPTIDE   Result Value Ref Range    BNP <10 <=100 pg/mL       Imaging Studies:    Results for orders placed or performed during the hospital encounter of 02/19/16 (from the past 24 hour(s))   XR AP MOBILE CHEST     Status: None    Narrative    EXAMINATION: AP chest radiograph 19 February 2016.  Comparison study is from   16 February 2016.    INDICATION: Chest pain    FINDINGS: The lungs demonstrate no focal consolidation, effusion,   pneumothorax or concerning nodules.  The cardiomediastinal silhouette is   unremarkable.  The osseous structures are unremarkable as well.      Impression     No acute thoracic abnormality.       CT CHEST FOR PULMONARY EMBOLUS W IV CONTRAST     Status: None    Narrative    EXAMINATION: CT of the chest with intravenous contrast (80 mL Isovue-370,   PE protocol) performed on 19 February 2016.    INDICATION: Chest pain, back pain, shortness of breath, elevated d-dimer    FINDINGS: No acute parenchymal abnormalities are identified in the lungs.    Multiple small, subcentimeter (3-4 mm), nonspecific, peripheral,   subpleuralnoncalcified pulmonary nodules are noted in the right middle   lobe (series 3 images 81 and 83) and in the left upper lobe (series 3   image 49).  The heart and other mediastinal structures are  unremarkable.    No filling defects are identified in the pulmonary arteries to indicate   pulmonary emboli.  No lymphadenopathy is appreciated in the chest.  The   thyroid gland is unremarkable.  There is a partially-imaged mass in the   upper portion of the left breast which is incompletely evaluated on this   study, but was demonstrated to represent a cyst on a breast ultrasound in   November 2015.  Limited assessment of the upper abdomen reveals no masses   or acute abnormalities of the included viscera.  The gallbladder is   surgically absent.  Evaluation of the osseous structures reveals no acute   abnormalities or any concerning lytic or sclerotic lesions.      Impression    1.  No evidence of PE or other acute thoracic abnormality.  2.  Nonspecific small subcentimeter pulmonary nodules measuring 3-4 mm.    If patient is considered at high risk for these nodules being malignant,   consider followup chest CT in 12 months.  If low risk, no followup is   required.  3.  Partially-imaged left breast mass.  This likely corresponds to the   cyst seen on breast ultrasound in November 2015.             Assessment/Plan:   [name] is a 60 y.o. female with PMH of mild CAD, HLD, rheumatic fever, GERD, depression, & arthritis who presents with c/o acute onset chest pain     Pleuritic Chest Pain, DOE, Wheezing   Hx of Mild CAD   HLD  - DDx: possible underlying COPD with exacerbation, PUD, gastritis, GERD. Less likely ACS, PE, pancreatitis  - TIMI  score 1 indicating 3-5% risk of MI/MI related death/urgent revascularization within the next 2 weeks   - Troponin: initial19, trending. BNP <10. VBG unremarkable. Lipase WNL. D-dimer was checked in the ED & found to be elevated (502) but CT PE was neg for thromboembolic event  - Serial troponin, HbgA1C, lipid panel, resp panel studies ordered  * CT PE: no PE but did show small pulm nodules (3-75m), will need f/u CT in 12 months given pt's high risk (significant smoking  hx w/in past 15 yrs)  - EKG shows no ischemic changes (showed sinus bradycardia (5 bpm)  - CXR shows no acute changes   - LHC (11/10/12): mild CAD (largest lesion 20-30% in the OM1) with normal LV function  - ASA 81 mg, atenolol 243m prn SL nitro. Had myalgia with atorvastatin (even at low dose)  - Albuterol nebs, incentive spirometry     Dysuria, Urinary Frequency & urgency  - Afebrile, physical exam unremarkable  - CBC & BMP unremarkable  - UA/UCx & BCx ordered    GERD: continue home Protonix 40 mg qday & Zantac  Depression: bupropion 200 mg qday  Peripheral Neuropathy: gabapentin 600 mg TID  Migraines: continue the atenolol   Intertrigo: nystatin powder  Vulvitis: holding halobetasol propionate cream at this time    DVT/PE Prophylaxis: Lovenox  Disposition Planning: Home discharge      DNR Status: Full Code    LiTia AlertMD   02/19/2016 14:09   Internal Medicine, PGY3   [facility]      I saw and examined the patient.  I reviewed the resident's note.  I agree with the findings and plan of care as documented in the resident's note.  Any exceptions/additions are edited/noted.    TrEverardo AllMD

## 2016-02-19 NOTE — Nurses Notes (Signed)
Pt resting in bed. States that he pain is somewhat relieved at this time. Orthostatics complete. Pt wonders if she can have something to drink. Will page service regarding NPO status.

## 2016-02-19 NOTE — Nurses Notes (Signed)
Pt resting in bed. Telemetry in place running sinus bady 38-54 bpm. Pt complaining of 8/10 chest pressure unrelieved by ultram. Service notified. Pt states that her pain worsens each time she gets it. EKG ordered. Orthostatics ordered. Atropine ordered if HR drops below 30. Pt does state that she is dizzy at rest as well as standing. Fall precautions maintained. Call bell within reach. Will continue to monitor.

## 2016-02-19 NOTE — ED Nurses Note (Signed)
1315- Patient taken on cardiac monitor to CT scan by RN.

## 2016-02-19 NOTE — Nurses Notes (Signed)
Received to 6N OBSU room 1 via stretcher from the ED with 1 attendant.

## 2016-02-19 NOTE — Care Management Notes (Signed)
[facility]  Care Management Note    Patient Name: [name]  Date of Birth: September 25, 1956  Sex: female  Date/Time of Admission: 02/19/2016 11:17 AM  Room/Bed: ER 35/35A  Payor: [place] / Plan: [place] / Product Type: Medicaid [place] /    LOS: 0 days   PCP: [name], MD      .   OBS letter given in ED via verbal explanation and written handout; pt accepts OBS letter and voices no concerns/questions at this time.The patient will continue to be evaluated for developing discharge needs.  Signature obtained.    Case Manager: Celso Amy, RN  [phone]

## 2016-02-19 NOTE — Nurses Notes (Signed)
Pulse rate 40, BP 136/69.  Experiencing chest pressure.  Dr. Robina Ade present and aware.

## 2016-02-19 NOTE — Nurses Notes (Signed)
Dr. Kathreen Devoid in and order received for ultram.

## 2016-02-19 NOTE — ED Nurses Note (Signed)
[facility] recorded at 1601.

## 2016-02-19 NOTE — ED Attending Handoff Note (Signed)
Care of patient assumed from Dr. Sabra Heck at 1600 with admission to medicine pending.    Barbara Spangle, MD  02/19/2016, 16:03  Chest pain  Nml ECG and neg troponins with + d-dimer and neg CT   admitted to medicine      BP 111/66  Pulse 54  Temp 37.2 C (99 F)  Resp 15  Ht 1.651 m ('5\' 5"'$ )  Wt 97.1 kg (214 lb)  SpO2 98%  BMI 35.61 kg/m2      Results for orders placed or performed during the hospital encounter of 02/19/16 (from the past 12 hour(s))   BASIC METABOLIC PANEL, NON-FASTING   Result Value Ref Range    SODIUM 138 136 - 145 mmol/L    POTASSIUM 3.9 3.5 - 5.1 mmol/L    CHLORIDE 108 96 - 111 mmol/L    CO2 TOTAL 22 22 - 32 mmol/L    ANION GAP 8 4 - 13 mmol/L    CALCIUM 9.1 8.5 - 10.4 mg/dL    GLUCOSE 93 65 - 139 mg/dL    BUN 12 8 - 25 mg/dL    CREATININE 1.05 0.49 - 1.10 mg/dL    BUN/CREA RATIO 11 6 - 22    ESTIMATED GFR 57 (L) >59 mL/min/1.62m2   PT/INR   Result Value Ref Range    PROTHROMBIN TIME 10.6 9.0 - 13.6 seconds    INR 0.94 0.80 - 1.20   PTT (PARTIAL THROMBOPLASTIN TIME)   Result Value Ref Range    APTT 29.1 25.1 - 36.5 seconds   TROPONIN-I (FOR ED ONLY)   Result Value Ref Range    TROPONIN I 19 0 - 30 ng/L   CBC WITH DIFF   Result Value Ref Range    WBC 7.1 3.5 - 11.0 x10^3/uL    RBC 4.92 3.63 - 4.92 x10^6/uL    HGB 14.1 11.2 - 15.2 g/dL    HCT 42.6 33.5 - 45.2 %    MCV 86.7 78.0 - 100.0 fL    MCH 28.7 27.4 - 33.0 pg    MCHC 33.1 32.5 - 35.8 g/dL    RDW 14.3 12.0 - 15.0 %    PLATELETS 367 140 - 450 x10^3/uL    MPV 7.2 (L) 7.5 - 11.5 fL    NEUTROPHIL % 53 %    LYMPHOCYTE % 34 %    MONOCYTE % 10 %    EOSINOPHIL % 3 %    BASOPHIL % 1 %    NEUTROPHIL # 3.70 1.50 - 7.70 x10^3/uL    LYMPHOCYTE # 2.39 1.00 - 4.80 x10^3/uL    MONOCYTE # 0.69 0.30 - 1.00 x10^3/uL    EOSINOPHIL # 0.20 0.00 - 0.50 x10^3/uL    BASOPHIL # 0.07 0.00 - 0.20 x10^3/uL   GOLD TOP TUBE   Result Value Ref Range    RAINBOW/EXTRA TUBE AUTO RESULT Yes    RED TOP TUBE   Result Value Ref Range    RAINBOW/EXTRA TUBE AUTO RESULT Yes      LIGHT GREEN TOP TUBE   Result Value Ref Range    RAINBOW/EXTRA TUBE AUTO RESULT Yes    DARK GREEN TUBE   Result Value Ref Range    RAINBOW/EXTRA TUBE AUTO RESULT Yes    MAGNESIUM   Result Value Ref Range    MAGNESIUM 2.4 1.6 - 2.5 mg/dL   PHOSPHORUS   Result Value Ref Range    PHOSPHORUS 2.9 2.4 - 4.7 mg/dL   THYROID STIMULATING HORMONE WITH FREE  T4 REFLEX   Result Value Ref Range    TSH 0.769 0.350 - 5.000 uIU/mL   D-DIMER   Result Value Ref Range    D-DIMER 502 (H) <=232 ng/mL DDU   LIPASE   Result Value Ref Range    LIPASE 56 10 - 80 U/L   VENOUS BLOOD GAS/K+/ICA++/CO-OX   Result Value Ref Range    %FIO2 28.0 %    PH 7.44 (H) 7.31 - 7.41    PCO2 36.00 (L) 41.00 - 51.00 mm/Hg    PO2 43.0 35.0 - 50.0 mm/Hg    BASE EXCESS 0.6 -3.0 - 3.0 mmol/L    BICARBONATE 25.0 22.0 - 26.0 mmol/L    IONIZED CALCIUM 1.19 1.10 - 1.36 mmol/L    WHOLE BLOOD POTASSIUM 3.9 3.5 - 5.0 mmol/L    OXYHEMOGLOBIN 83.5 (H) 40.0 - 80.0 %    CARBOXYHEMOGLOBIN 1.9 0.0 - 2.5 %    MET-HEMOGLOBIN 0.1 0.0 - 3.5 %    HEMOGLOBIN 13.9 12.0 - 18.0 g/dL    O2CT 16.3 6.7 - 17.5 %   LACTIC ACID   Result Value Ref Range    LACTIC ACID 1.0 0.5 - 2.2 mmol/L   B-TYPE NATRIURETIC PEPTIDE   Result Value Ref Range    BNP <10 <=100 pg/mL         CT CHEST FOR PULMONARY EMBOLUS W IV CONTRAST   Final Result   1.  No evidence of PE or other acute thoracic abnormality.   2.  Nonspecific small subcentimeter pulmonary nodules measuring 3-4 mm.     If patient is considered at high risk for these nodules being malignant,    consider followup chest CT in 12 months.  If low risk, no followup is    required.   3.  Partially-imaged left breast mass.  This likely corresponds to the    cyst seen on breast ultrasound in November 2015.            XR AP MOBILE CHEST   Final Result    No acute thoracic abnormality.                  Disposition: Admitted    Follow up:   [name], MD  [address]  [address]  [place] [id number]  [phone]    On  02/23/2016  Hospital discharge follow up at 10:10am      Clinical Impression:     Encounter Diagnoses   Name Primary?   . Chest pain, unspecified type Yes   . Dyspnea    . Pulmonary nodules    . Left breast mass    . Positive D dimer    . Chest pain        Future Appointments scheduled in [place]:   Future Appointments  Date Time Provider [place]   02/23/2016 10:10 AM [name], MD [place] [facility]   06/21/2016 10:50 AM [name], MD [place] [facility]   07/28/2016 10:40 AM Mannie Stabile, MD [place] [facility]

## 2016-02-19 NOTE — ED Provider Notes (Signed)
[facility]  Emergency Department  HPI - 02/19/2016    Chief Complaint:   Chest Pain  History of Present Illness:   Placida Cambre, 60 y.o. female    Significant PMH: HLD, GERD, BCC   Patient states this morning (about 2 hours ago), she began experiencing chest pain   Substernal and epigastric in location   Constant in duration   Radiates to the neck and back   Exertion makes it worse   Nothing makes it better   Lying flat makes it worse   Upright makes it better   No recent trauma to the chest   Worse with deep inspiration   No history of blood clots   History of skin cancer   Cardiac risk factors: HLD, obesity, hx of 30 pack year smoking history   Heart score = 3   Wells = Low pre-test probability     History Limitations: None    Review of Systems:   Constitutional: No fever, chills weakness   Skin: No rashes or discharge   HENT: No headaches, congestion   Eyes: No vision changes, discharge   Cardio: Positive for chest pain, negative for leg swelling    Respiratory: No cough, wheezing, positive for SOB   GI:  No nausea, vomiting, diarrhea, constipation   GU: No dysuria, hematuria, polyuria   MSK: No joint or back pain   Neuro: No loss of sensation, focal deficits   Psych: No mood changes.     Medications:  Prior to Admission Medications   Prescriptions Last Dose Informant Patient Reported? Taking?   Azelaic Acid 20 % Cream   No No   Sig: Apply topically Twice daily   Clobetasol (TEMOVATE) 0.05 % Ointment   No No   Sig: Apply topically Twice daily To rash on hands and feet   Halobetasol Propionate (ULTRAVATE) 0.05 % Cream  Patient No No   Sig: Apply  topically. Three times a week   Metronidazole (METROCREAM) 0.75 % Cream   No No   Sig: Apply topically Twice daily   Sulfacetamide Sodium-Sulfur 10-5 % (w/w) Cleanser   No No   Sig: 1 Squirt by Apply externally route Once a day   atenolol (TENORMIN) 25 mg Oral Tablet   No No   Sig: Take 1 Tab (25 mg total) by mouth  Once a day   buPROPion (WELLBUTRIN XL) 300 mg Oral Tablet Sustained Release 24 hr   No No   Sig: TAKE 1 TAB (300 MG TOTAL) BY MOUTH EVERY MORNING   gabapentin (NEURONTIN) 300 mg Oral Capsule   No No   Sig: Take 2 Caps (600 mg total) by mouth Three times a day   mineral oil-hydrophil petrolat (AQUAPHOR) Ointment   No No   Sig: by Apply Topically route Four times a day as needed   nystatin (NYSTOP) 100,000 unit/gram Powder   No No   Sig: Apply once or twice daily to PREVENT rash under breasts   pantoprazole (PROTONIX) 40 mg Oral Tablet, Delayed Release (E.C.)   No No   Sig: Take 1 Tab (40 mg total) by mouth Every morning before breakfast   raNITIdine (ZANTAC) 15 mg/mL Oral Syrup   No No   Sig: Take 10 mL (150 mg total) by mouth Every night   riboflavin, vitamin B2, (VITAMIN B-2) 100 mg Oral Tablet   No No   Sig: Take 4 Tabs (400 mg total) by mouth Once a day   selenium  sulfide (SELSUN) 2.5 % Lotion   No No   Sig: 1 Squirt by Apply Topically route Every 7 days   triamcinolone acetonide (ARISTOCORT A) 0.1 % Ointment   No No   Sig: by Apply Topically route Twice daily      Facility-Administered Medications: None     Allergies:  Allergies   Allergen Reactions    Cymbalta [Duloxetine]      Rash on feet and hands    Blue Dye      Told to avoid due to patch test result by [facility] [place]    Lipitor [Atorvastatin]      Muscle pain    Nickel     Codeine Itching     RASH    Mobic [Meloxicam] Nausea/ Vomiting    Naprosyn [Naproxen] Nausea/ Vomiting     Past Medical History:  Past Medical History:   Diagnosis Date    Arthritis     Cancer ([place])     sqaumos- skin    Depression     GERD (gastroesophageal reflux disease)     controlled w/med    Headache(784.0)     Migraines    Heart murmur 1979    benign    Hyperlipidemia LDL goal < 100 08/29/2013    Influenza-like illness     MINOR CAD (coronary artery disease) 11/10/2012    no treatment other than cholesterol medications; follows regularly with Dr. Rodney Booze     Multinodular goiter     Rheumatic fever 17    no complications    Squamous cell carcinoma ([place]) 02/06/2015    Vaginal prolapse 2012    surgery improved significantly    Wears dentures     Wears glasses      Past Surgical History:  Past Surgical History:   Procedure Laterality Date    COLONOSCOPY  01/06/09    COLONOSCOPY performed by Loletta Parish F at [place]  03/05/2010    GASTROSCOPY performed by Delrae Alfred, SWATI at [place]  03/05/2010    GASTROSCOPY WITH BIOPSY performed by Jocelyn Lamer at [place]  2007    left distal fibula, casted    HX APPENDECTOMY      HX CHOLECYSTECTOMY      HX COLONOSCOPY      HX CYSTOCELE REPAIR  09/24/09    HX HAND SURGERY  2012    for Carpal tunnel     HX OOPHORECTOMY      left ovary removed    HX TONSILLECTOMY      HX TOTAL VAGINAL HYSTERECTOMY  1979    HX WRIST FRACTURE [place]  2007    [place] injury    PB REVISE ULNAR NERVE AT [place]    PB UPPER GI ENDOSCOPY,BIOPSY  12/19/07    patulous GE junction zone, erythema, nonerosive GERD     Social History:  Social History     Social History    Marital status: Divorced     Spouse name: N/A    Number of children: N/A    Years of education: N/A     Occupational History    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 Employed     occasionally     Social History Main Topics    Smoking status: Former Smoker     Packs/day: 2.00     Years: 40.00  Types: Cigarettes     Quit date: 01/22/2005    Smokeless tobacco: Never Used    Alcohol use No    Drug use: No    Sexual activity: Not on file     Other Topics Concern    Uses Cane No    Uses Walker No    Uses Wheelchair No    Right Hand Dominant Yes    Left Hand Dominant No    Ambidextrous No    Ability To Walk 1 Flight Of Steps Without Sob/Cp Yes    Routine Exercise Yes     walk    Ability To Walk 2 Flight Of Steps Without Sob/Cp Yes    Ability To Do Own Adl's Yes    Shift Work No    Unusual  Sleep-Wake Schedule No     Social History Narrative     Family History:  Family History   Problem Relation Age of Onset    Congestive Heart Failure Father 84     died at 9    Coronary Artery Disease Father     Heart Attack Father     Heart Attack Sister      37    Kidney Disease Sister      ESRD    Stroke Paternal Grandmother     Diabetes Mother     High Cholesterol Mother     Hypertension Mother     Coronary Artery Disease Mother 40     CABG    Thyroid Disease Sister     Hypertension Brother     Hypertension Sister     Hypertension Sister     Bipolar Disorder Daughter     Cancer Paternal Aunt      Breast Ca    Breast Cancer Paternal Aunt     Cancer Paternal Aunt      Breast Ca    Breast Cancer Paternal Aunt     Cancer Paternal Aunt      Breast Ca    Breast Cancer Paternal Aunt     Cancer Paternal Aunt      Breast Ca    Breast Cancer Paternal Aunt     Cancer Paternal Aunt      Breast Ca    Breast Cancer Paternal Aunt     Leukemia Paternal Uncle     Cancer Other 35     Breast Ca/Breast Ca    Breast Cancer Other     Leukemia Paternal Uncle      Physical Exam:  All nurse's notes reviewed.  ED Triage Vitals   Enc Vitals Group      BP (Non-Invasive) 02/19/16 1114 160/84      Heart Rate 02/19/16 1114 80      Respiratory Rate 02/19/16 1114 18      Temperature 02/19/16 1114 37.2 C (99 F)      Temp src --       SpO2-1 02/19/16 1114 96 %      Weight 02/19/16 1114 97.1 kg (214 lb)      Height 02/19/16 1114 1.651 m (_0 )      Head Cir --       Peak Flow --       Pain Score --       Pain Loc --       Pain Edu? --       Excl. in [place]? --       Constitutional: Mild distress, diaphoretic lying in bed. A+Ox3  HENT:    Head: NC AT    Eyes: PERRL   Neck: Trachea midline.    Cardiovascular: RRR, No murmurs, rubs or gallops.    Pulmonary/Chest: BS equal bilaterally, shallow breathing. No wheezing appreciated.   Abdominal: BS +. Abdomen soft, no tenderness, rebound or guarding.      Musculoskeletal: No obvious deformity, swelling.   Skin: Warm and dry. No rash, erythema, pallor or cyanosis.   Psychiatric: Behavior is normal. Mood and affect congruent.     Neurological: Alert&Ox3. Grossly intact.     Labs:  Results for orders placed or performed during the hospital encounter of 02/19/16 (from the past 24 hour(s))   VENOUS BLOOD GAS/K+/ICA++/CO-OX   Result Value Ref Range    %FIO2 28.0 %    PH 7.44 (H) 7.31 - 7.41    PCO2 36.00 (L) 41.00 - 51.00 mm/Hg    PO2 43.0 35.0 - 50.0 mm/Hg    BASE EXCESS 0.6 -3.0 - 3.0 mmol/L    BICARBONATE 25.0 22.0 - 26.0 mmol/L    IONIZED CALCIUM 1.19 1.10 - 1.36 mmol/L    WHOLE BLOOD POTASSIUM 3.9 3.5 - 5.0 mmol/L    OXYHEMOGLOBIN 83.5 (H) 40.0 - 80.0 %    CARBOXYHEMOGLOBIN 1.9 0.0 - 2.5 %    MET-HEMOGLOBIN 0.1 0.0 - 3.5 %    HEMOGLOBIN 13.9 12.0 - 18.0 g/dL    O2CT 16.3 6.7 - 17.5 %   CBC/DIFF    Narrative    The following orders were created for panel order CBC/DIFF.  Procedure                               Abnormality         Status                     ---------                               -----------         ------                     CBC WITH TMLY[[id number]]                Abnormal            Final result                 Please view results for these tests on the individual orders.   RAINBOW DRAW - RUBY ONLY    Narrative    The following orders were created for panel order RAINBOW DRAW - RUBY ONLY.  Procedure                               Abnormality         Status                     ---------                               -----------         ------                     GOLD TOP CLEX[[id number]]  In process                 RED TOP GLOV[[id number]]                                     In process                 LIGHT GREEN TOP TUBE[[id number]]                             In process                 DARK GREEN TUBE[[id number]]                                  In process                 BLOOD BANK HOLD [id number]                              In process                   Please view results for these tests on the individual orders.   CBC WITH DIFF   Result Value Ref Range    WBC 7.1 3.5 - 11.0 x103/uL    RBC 4.92 3.63 - 4.92 x106/uL    HGB 14.1 11.2 - 15.2 g/dL    HCT 42.6 33.5 - 45.2 %    MCV 86.7 78.0 - 100.0 fL    MCH 28.7 27.4 - 33.0 pg    MCHC 33.1 32.5 - 35.8 g/dL    RDW 14.3 12.0 - 15.0 %    PLATELETS 367 140 - 450 x103/uL    MPV 7.2 (L) 7.5 - 11.5 fL    NEUTROPHIL % 53 %    LYMPHOCYTE % 34 %    MONOCYTE % 10 %    EOSINOPHIL % 3 %    BASOPHIL % 1 %    NEUTROPHIL # 3.70 1.50 - 7.70 x103/uL    LYMPHOCYTE # 2.39 1.00 - 4.80 x103/uL    MONOCYTE # 0.69 0.30 - 1.00 x103/uL    EOSINOPHIL # 0.20 0.00 - 0.50 x103/uL    BASOPHIL # 0.07 0.00 - 0.20 x103/uL       Imaging:  Results for orders placed or performed during the hospital encounter of 02/19/16 (from the past 72 hour(s))   XR AP MOBILE CHEST     Status: None    Narrative    EXAMINATION: AP chest radiograph 19 February 2016.  Comparison study is from   16 February 2016.    INDICATION: Chest pain    FINDINGS: The lungs demonstrate no focal consolidation, effusion,   pneumothorax or concerning nodules.  The cardiomediastinal silhouette is   unremarkable.  The osseous structures are unremarkable as well.      Impression     No acute thoracic abnormality.           Orders Placed This Encounter    XR AP MOBILE CHEST    VENOUS BLOOD GAS/K+/ICA++/CO-OX    CBC/DIFF    BASIC METABOLIC PANEL, NON-FASTING    PT/INR    PTT (PARTIAL THROMBOPLASTIN TIME)    TROPONIN-I (FOR ED ONLY)  RAINBOW DRAW - RUBY ONLY    CBC WITH DIFF    GOLD TOP TUBE    RED TOP TUBE    LIGHT GREEN TOP TUBE    DARK GREEN TUBE    LACTIC ACID    MAGNESIUM    PHOSPHORUS    THYROID STIMULATING HORMONE WITH FREE T4 REFLEX    B-TYPE NATRIURETIC PEPTIDE    D-DIMER    LIPASE    ECG 12-LEAD    BLOOD BANK HOLD TUBE    INSERT & MAINTAIN PERIPHERAL IV ACCESS    AND Linked Order Group     NS flush syringe      NS flush syringe     ECG: Sinus rhythm, No ischemic changes concerning for MI, No arrhythmia    Plan: Appropriate labs and imaging ordered. Medical Records reviewed.    Therapy/Procedures/Course/MDM:    Patient was bradycardic during course of stay in the ED     Physical - Mild distress, diaphoretic lying in bed. A+Ox3, RRR, No murmurs, rubs or gallops, BS equal bilaterally, shallow breathing. No wheezing appreciated     Labs - CBC - unremarkable, BMP - unremarkable, Troponin - 19, TSH - 0.769, D-dimer - 502, Lipase - 56, VBG - pH - 7.44, pCO2 - 36, Bicbarb - 22, Lactic Acid - 1, BNP - 10   ECG: Sinus rhythm, No ischemic changes concerning for MI, No arrhythmia   Imaging - CXR - no acute thoracic abnormality; CT Chest PE - no evidence of PE or other acute thoracic abnormality; nonspecific pulmonary nodules; partially imaged breast mass   Chest pain of unknown etiology at this time (less likely MI, PE or gastroenterologic in nature), suspicious pulmonary nodules and partially imaged left breast mass on CT PE; admit to medicine to further evaluation of chest pain  Consults: Medicine  Impression: Chest Pain  Disposition:     Patient will be admitted to Internal Medicine service for further evaluation and management of chest pain.      Fathima Bartl (Sherren Mocha) Delman Cheadle, MD  PGY-1  [facility]

## 2016-02-19 NOTE — ED Nurses Note (Signed)
Patient returned to ED room 35, reports pain hasn't improved yet.  Will continue to monitor.

## 2016-02-20 ENCOUNTER — Encounter (HOSPITAL_COMMUNITY): Payer: Self-pay | Admitting: Psychiatry

## 2016-02-20 ENCOUNTER — Observation Stay (HOSPITAL_BASED_OUTPATIENT_CLINIC_OR_DEPARTMENT_OTHER): Payer: MEDICAID

## 2016-02-20 ENCOUNTER — Observation Stay (HOSPITAL_COMMUNITY): Payer: MEDICAID

## 2016-02-20 DIAGNOSIS — R0600 Dyspnea, unspecified: Secondary | ICD-10-CM

## 2016-02-20 DIAGNOSIS — R300 Dysuria: Secondary | ICD-10-CM

## 2016-02-20 DIAGNOSIS — R079 Chest pain, unspecified: Secondary | ICD-10-CM

## 2016-02-20 LAB — ECG 12-LEAD
Atrial Rate: 50 {beats}/min
Atrial Rate: 45 {beats}/min
Atrial Rate: 45 {beats}/min
Calculated P Axis: 42 degrees
Calculated P Axis: 19 degrees
Calculated R Axis: -9 degrees
Calculated R Axis: -11 degrees
Calculated T Axis: -2 degrees
PR Interval: 158 ms
PR Interval: 186 ms
QRS Duration: 86 ms
QRS Duration: 66 ms
QRS Duration: 66 ms
QT Interval: 394 ms
QT Interval: 436 ms
QT Interval: 436 ms
QTC Calculation: 359 ms
Ventricular rate: 50 {beats}/min
Ventricular rate: 45 {beats}/min

## 2016-02-20 LAB — ACETAMINOPHEN LEVEL: ACETAMINOPHEN LEVEL: <5 ug/mL — ABNORMAL LOW (ref 10–30)

## 2016-02-20 LAB — SALICYLATE ACID LEVEL
SALICYLATE LEVEL: <5 mg/dL
SALICYLATE LEVEL: <5 mg/dL

## 2016-02-20 LAB — CBC WITH DIFF
BASOPHIL #: 0.04 x10ˆ3/uL (ref 0.00–0.20)
BASOPHIL %: 1 %
EOSINOPHIL #: 0.23 x10ˆ3/uL (ref 0.00–0.50)
EOSINOPHIL %: 3 %
HCT: 40 % (ref 33.5–45.2)
HGB: 13.5 g/dL (ref 11.2–15.2)
LYMPHOCYTE #: 3.14 x10ˆ3/uL (ref 1.00–4.80)
LYMPHOCYTE %: 43 %
MCH: 29.4 pg (ref 27.4–33.0)
MCHC: 33.7 g/dL (ref 32.5–35.8)
MCV: 87.3 fL (ref 78.0–100.0)
MONOCYTE #: 0.62 x10ˆ3/uL (ref 0.30–1.00)
MONOCYTE %: 8 %
MPV: 7.2 fL — ABNORMAL LOW (ref 7.5–11.5)
NEUTROPHIL #: 3.34 10*3/uL (ref 1.50–7.70)
NEUTROPHIL #: 3.34 x10ˆ3/uL (ref 1.50–7.70)
PLATELETS: 311 x10ˆ3/uL (ref 140–450)
RBC: 4.58 x10ˆ6/uL (ref 3.63–4.92)
RDW: 14.5 % (ref 12.0–15.0)
WBC: 7.4 10*3/uL (ref 3.5–11.0)
WBC: 7.4 x10ˆ3/uL (ref 3.5–11.0)

## 2016-02-20 LAB — INFLUENZA VIRUS TYPE A AND TYPE B, AND RESPIRATORY SYNCYTIAL VIRUS (RSV), PCR
INFLUENZA VIRUS TYPE A: NOT DETECTED
INFLUENZA VIRUS TYPE B: NOT DETECTED
RESPIRATORY SYNCYTIAL VIRUS (RSV): NOT DETECTED

## 2016-02-20 LAB — PHOSPHORUS
PHOSPHORUS: 4.7 mg/dL (ref 2.4–4.7)
PHOSPHORUS: 4.7 mg/dL (ref 2.4–4.7)

## 2016-02-20 LAB — BASIC METABOLIC PANEL
ANION GAP: 8 mmol/L (ref 4–13)
BUN/CREA RATIO: 13 (ref 6–22)
BUN: 15 mg/dL (ref 8–25)
CALCIUM: 9 mg/dL (ref 8.5–10.4)
CHLORIDE: 110 mmol/L (ref 96–111)
CHLORIDE: 110 mmol/L (ref 96–111)
CO2 TOTAL: 22 mmol/L (ref 22–32)
CREATININE: 1.19 mg/dL — ABNORMAL HIGH (ref 0.49–1.10)
ESTIMATED GFR: 50 mL/min/1.73mˆ2 — ABNORMAL LOW (ref >59)
GLUCOSE: 110 mg/dL (ref 65–139)
POTASSIUM: 3.7 mmol/L (ref 3.5–5.1)
SODIUM: 140 mmol/L (ref 136–145)

## 2016-02-20 LAB — PROCALCITONIN: PROCALCITONIN: <0.05 ng/mL

## 2016-02-20 LAB — DRUG SCREEN, HIGH OPIATE CUTOFF, NO CONFIRMATION, URINE
AMPHETAMINES URINE: NEGATIVE
BARBITURATES URINE: NEGATIVE
BENZODIAZEPINES URINE: NEGATIVE
BUPRENORPHINE URINE: NEGATIVE
CANNABINOIDS URINE: NEGATIVE
CANNABINOIDS URINE: NEGATIVE
COCAINE METABOLITES URINE: NEGATIVE
CREATININE RANDOM URINE: 67 mg/dL
ECSTASY/MDMA URINE: NEGATIVE
METHADONE URINE: NEGATIVE
OPIATES URINE (HIGH CUTOFF): NEGATIVE
OXYCODONE URINE: NEGATIVE

## 2016-02-20 LAB — PARAINFLUENZA VIRUS (1,2,3), PCR: PARAINFLUENZA PCR 1: NOT DETECTED

## 2016-02-20 LAB — MAGNESIUM: MAGNESIUM: 2.3 mg/dL (ref 1.6–2.5)

## 2016-02-20 LAB — HGA1C (HEMOGLOBIN A1C WITH EST AVG GLUCOSE)
ESTIMATED AVERAGE GLUCOSE: 108 mg/dL
HEMOGLOBIN A1C: 5.4 % (ref 4.0–6.0)

## 2016-02-20 LAB — C-REACTIVE PROTEIN(CRP),INFLAMMATION: CRP INFLAMMATION: 7.1 mg/L (ref <=8.0)

## 2016-02-20 LAB — HUMAN METAPNEUMOVIRUS (HMPV), PCR: HUMAN METAPNEUMOVIRUS PCR: NOT DETECTED

## 2016-02-20 LAB — ETHANOL, SERUM: ETHANOL: NOT DETECTED

## 2016-02-20 MED ORDER — IBUPROFEN 200 MG TABLET
400.0000 mg | ORAL_TABLET | Freq: Once | ORAL | Status: DC
Start: 2016-02-20 — End: 2016-02-21
  Administered 2016-02-20: 0 mg via ORAL

## 2016-02-20 MED ORDER — REGADENOSON 0.4 MG/5 ML INTRAVENOUS SYRINGE
INJECTION | INTRAVENOUS | Status: AC
Start: 2016-02-20 — End: 2016-02-20
  Filled 2016-02-20: qty 5

## 2016-02-20 MED ORDER — REGADENOSON 0.4 MG/5 ML INTRAVENOUS SYRINGE
0.40 mg | INJECTION | INTRAVENOUS | Status: DC
Start: 2016-02-20 — End: 2016-02-21

## 2016-02-20 MED ORDER — ACETAMINOPHEN 325 MG TABLET
325.0000 mg | ORAL_TABLET | Freq: Four times a day (QID) | ORAL | Status: DC | PRN
Start: 2016-02-20 — End: 2016-02-21

## 2016-02-20 MED ADMIN — nystatin 100,000 unit/gram topical powder: TOPICAL | @ 21:00:00

## 2016-02-20 NOTE — Nurses Notes (Signed)
47  Pt resting in bed, talking on phone, said Ultram helped with her pain, see doc flow for complete assessment, CWRN

## 2016-02-20 NOTE — Nurses Notes (Signed)
Pt down having Stress test

## 2016-02-20 NOTE — Nurses Notes (Signed)
Pt up to [facility]. C/o headache 7/10 which she relates to not having caffeine. Service aware. Will give ultram.

## 2016-02-20 NOTE — Nurses Notes (Signed)
Pt resting in bed. Telemetry in place running sinus brady 50s-60s. Fall precautions maintained. Call bell within reach. Will continue to monitor.

## 2016-02-20 NOTE — Nurses Notes (Signed)
Pt resting in bed. Denies any complaints at this time. Telemetry in place running sinus brady 50s-60s. Safety maintained. Call bell within reach. Will continue to monitor.

## 2016-02-20 NOTE — Nurses Notes (Signed)
Paged service about the Neurontin dose, pt states she takes 200mg  TID at home and it is ordered 300mg  TID and 300mg  every evening, night time on call said to pass along that question in the morning and verify the dose, CWRN

## 2016-02-20 NOTE — Nurses Notes (Signed)
No changes noted.Will cont to monitor

## 2016-02-20 NOTE — Care Plan (Signed)
Problem: Depression (Adult,Obstetrics,Pediatric)  Goal: Identify Related Risk Factors and Signs and Symptoms  Related risk factors and signs and symptoms are identified upon initiation of Human Response Clinical Practice Guideline (CPG)   Outcome: Ongoing (see interventions/notes)  Goal: Establish/Maintain Self-Care Routine  Patient will demonstrate the desired outcomes by discharge/transition of care.   Outcome: Ongoing (see interventions/notes)  Goal: Improved/Stable Mood  Patient will demonstrate the desired outcomes by discharge/transition of care.   Outcome: Ongoing (see interventions/notes)    Problem: Cardiac: Acute Coronary Syndrome (ACS) (Adult)  Prevent and manage potential problems including:1. cardiovascular structural defects2. chest pain (angina)3. dysrhythmia/arrhythmia4. embolism5. hemodynamic instability6. ischemia leading to infarction7. pericarditis8. situational response   Goal: Signs and Symptoms of Listed Potential Problems Will be Absent or Manageable (Cardiac: Acute Coronary Syndrome)  Signs and symptoms of listed potential problems will be absent or manageable by discharge/transition of care (reference Cardiac: Acute Coronary Syndrome (ACS) (Adult) CPG).   Outcome: Ongoing (see interventions/notes)    Problem: Fall Risk (Adult)  Goal: Identify Related Risk Factors and Signs and Symptoms  Related risk factors and signs and symptoms are identified upon initiation of Human Response Clinical Practice Guideline (CPG)   Outcome: Ongoing (see interventions/notes)  Goal: Absence of Falls  Patient will demonstrate the desired outcomes by discharge/transition of care.   Outcome: Ongoing (see interventions/notes)

## 2016-02-20 NOTE — Care Management Notes (Signed)
[facility]  Care Management Note    Patient Name: [name]  Date of Birth: 1956/01/27  Sex: female  Date/Time of Admission: 02/19/2016 11:17 AM  Room/Bed: OBSU6N/01  Payor: [place] / Plan: [place] / Product Type: Medicaid [facility] /    LOS: 0 days   PCP: [name], MD    Admitting Diagnosis:  Chest pain [R07.9]    Assessment:      02/20/16 1108   Assessment Details   Assessment Type Admission   Date of Care Management Update 02/20/16   Date of Next DCP Update 02/23/16   Readmission   Is this a readmission? No   Care Management Plan   Discharge Planning Status initial meeting   Projected Discharge Date 02/21/16   CM will evaluate for rehabilitation potential no   Patient choice offered to patient/family no   Form for patient choice reviewed/signed and on chart no   Patient aware of possible cost for ambulance transport?  No   Discharge Needs Assessment   Equipment Currently Used at Home cane, straight;walker, rolling   Equipment Needed After Discharge none   Discharge Facility/Level Of Care Needs Home (Patient/Family Member/other)(code 1)   Transportation Available car;family or friend will provide   Referral Information   Admission Type observation   Address Verified verified-no changes   Arrived From home or self-care   Insurance Verified verified-no change   ADVANCE DIRECTIVES   Does the Patient have an Advance Directive? No, Information Offered and Refused   Patient Requests Assistance in Having Advance Directive Notarized. N/A   Employment/Financial   Patient has Prescription Coverage?  Yes       Name of Insurance Coverage for Medications [place] an age group to open "lives with" row.  Adult   Lives With alone   Living Arrangements apartment   Able to Return to Prior Living Arrangements yes   Home Safety   Home Assessment: No Problems Identified   Home Accessibility no concerns;stairs  to enter home;stairs (1 railing present)   Legal Issues   Do you have a court appointed guardian/conservator? No   Patient Hand-Off   Clinical/Discharge Plan of Care Information Communicated to:  Clinical Care Coordinator   Living Environment   Number of Stairs to Enter Home 5   60 y.o., White female who presents with c/o acute onset pleuritic chest pain (10/10 sharp sensation that radiated to left side of her neck lasting a couple of seconds then drops to 5/10 pressure-like sensation, improved when sitting up, worse with movement & any type of exertion) associated with dyspnea, orthopnea, night sweat, dry cough x2 days    Discharge Plan:  Home (Patient/Family Member/other) (code 1)  CCC met with pt to do initial interview and start discharge planning. Pt lives alone. Pt has no HH, uses a straight cane and FWW as needed and has prescription coverage. Family to transport pt home. Daughter made lay caregiver. Per service, CT to r/o PE, no PE: but did show small pulm nodules (3-73m), f/u CT in 12 months.  EKG shows no ischemic changes.  CXR shows no acute changes. Plan is to discharge pt home when medically cleared for discharge. The patient will continue to be evaluated for developing discharge needs.        Case Manager: ELeo Grosser CHewittCOORDINATOR  Phone: [phone]

## 2016-02-20 NOTE — Nurses Notes (Signed)
2000  Pt sister called from [place] upset about not being able to be here to visit.  Said pt heart monitor was not working and was sort of yelling about it.  I explained to her that is was working and that I hadnt heard anything in report about it not reading but that I would check.  Went in to see the patient and asked her about it, told her her sister had called, she explained that earlier in the day the battery had to be changed twice.  Pt assessment per doc lfow, hr in the 70-80 at that time, [facility]  2200  Cardiology Dr had been on floor to see patient and told her that her ECHO had shown some possible irregularities and that the attending had to look at it to be sure and a Cath was a possibility if it was irregular, pt voiced understanding.  Pt was medicated with Ultram at 2029 for c/o same chest pain she has been having.  Pt asked for coffee also and gave her some, said she had a headache and hasnt had her usual coffee for 2 days, CWRN

## 2016-02-20 NOTE — Care Plan (Signed)
Problem: General Plan of Care(Adult,OB)  Goal: Plan of Care Review(Adult,OB)  The patient and/or their representative will communicate an understanding of their plan of care   Outcome: Ongoing (see interventions/notes)  Discharge Plan:  Home (Patient/Family Member/other) (code 1)  CCC met with pt to do initial interview and start discharge planning. Pt lives alone. Pt has no HH, uses a straight cane and FWW as needed and has prescription coverage. Family to transport pt home. Daughter made lay caregiver. Per service, CT to r/o PE, no PE: but did show small pulm nodules (3-7m), f/u CT in 12 months.  EKG shows no ischemic changes.  CXR shows no acute changes. Plan is to discharge pt home when medically cleared for discharge. The patient will continue to be evaluated for developing discharge needs.

## 2016-02-20 NOTE — Consults (Addendum)
[facility]  Cardiology Consult  Initial Consultation  Please place consult order in epic if not already done      [name],   60 y.o. female  Date of Birth:  [birth year]    Date of service: 02/20/2016    Requesting Faculty: Baxter Hire, MD    Requesting Service: MED HOSPITALIST 4    Information Obtained from: patient and history reviewed via medical record    CC:  Chest pain    ASSESSMENT & PLAN:  Pleuritic Chest Pain  - she is s/p all work up including CT PE (negative for PE), Echo and MPS  - Unlikely to be CAD related. Can not r/o Pericarditis. Follow CRP level and we will review her echo and CT chest for possible pericardial thickening and effusion  - Pt sts this is similar to her prior "pleurisy" attack.  - EKG - chronic non specific ST T wave changes in the septal leads and III  - Added CRP to her earlier blood draw, please follow-up and assess as needed  - Pain is not relieved w nitro. Can consider trail of NSAIDs if pain is not felt to be from GI or left breast cyst related    Abnormal MPS   - Read as mildly decreased anterior anterior apical wall activity on stress - Low risk findings  - EKG - chronic non specific ST T wave changes in the septal leads and III  - Last cath done after abnormal stress test in Jan, 2014 showed mild CAD with high LVEDP of 19  - Given atypical sx w/o any limitations to exercise capacity would recommend medical management  - c/w aggressive CAD risk factor management  - Can follow-up with her PCP as needed    CO-MORBIDITIES: Management per primary team    We will sign off for now if no echo or CT findings suggestive of pericarditis    STUDIES:  ECHO (02/20/16): EF: 55-60%. No Diastolic dysfnx  Lexiscan (02/20/16): Mildly decreased anterior apical wall activity on stress images, normal at rest. This may relate to shifting breast attenuation artifact, though mild ischemia cannot be excluded.  CATH (11/10/12):   Indication: Abnormal stress test  1. Normal left  ventricular function.  2. Mild coronary artery disease.    HPI:  This is a 60 y.o. female with PMH of HTN, mild non-obstructive CAD, HLD, Anxiety presents to the ER with hypoxia and sudden onset pleuritic chest pain that started on her left breast and at times radiating down below the breast and to the neck and is worse with inspiration which claims is similar but worse than her prior "pleurisy" attack. She was not able to elaborate on this pleurisy hx.     Cardiac Risk Factors:  hypertension, CAD, dyslipidemia, tobacco use history, stop date 01/22/2005, obesity, Family History of CAD female less than 49 yrs of age    Past Medical History:   Diagnosis Date    Arthritis     Depression     GERD (gastroesophageal reflux disease)     controlled w/med    Heart murmur 1979    benign    Hyperlipidemia LDL goal < 100 08/29/2013    MINOR CAD (coronary artery disease) 11/10/2012    no treatment other than cholesterol medications; follows regularly with Dr. Rodney Booze    Multinodular goiter     Rheumatic fever 17    no complications    Squamous cell carcinoma ([place]) 02/06/2015    Vaginal prolapse 2012  surgery improved significantly     Past Surgical History:   Procedure Laterality Date    COLONOSCOPY  01/06/09    COLONOSCOPY performed by Loletta Parish F at [place]  03/05/2010    GASTROSCOPY performed by Jocelyn Lamer at [place]  03/05/2010    GASTROSCOPY WITH BIOPSY performed by Jocelyn Lamer at [place]  2007    left distal fibula, casted    HX APPENDECTOMY      HX CHOLECYSTECTOMY      HX COLONOSCOPY      HX CYSTOCELE REPAIR  09/24/09    HX HAND SURGERY  2012    for Carpal tunnel     HX OOPHORECTOMY      left ovary removed    HX TONSILLECTOMY      HX TOTAL VAGINAL HYSTERECTOMY  1979    HX WRIST FRACTURE [place]  2007    [place] injury    PB REVISE ULNAR NERVE AT [place]    PB UPPER GI ENDOSCOPY,BIOPSY  12/19/07     patulous GE junction zone, erythema, nonerosive GERD     Allergies   Allergen Reactions    Cymbalta [Duloxetine]      Rash on feet and hands    Blue Dye      Told to avoid due to patch test result by [facility] [place]    Lipitor [Atorvastatin]      Muscle pain    Nickel     Codeine Itching     RASH    Mobic [Meloxicam] Nausea/ Vomiting    Naprosyn [Naproxen] Nausea/ Vomiting     Dye Allergy:  No  Iodine Allergy:  No    Family History  Family History   Problem Relation Age of Onset    Congestive Heart Failure Father 59     died at 68    Coronary Artery Disease Father     Heart Attack Father     Heart Attack Sister      46    Kidney Disease Sister      ESRD    Stroke Paternal Grandmother     Diabetes Mother     High Cholesterol Mother     Hypertension Mother     Coronary Artery Disease Mother 6     CABG    Thyroid Disease Sister     Hypertension Brother     Hypertension Sister     Hypertension Sister     Bipolar Disorder Daughter     Cancer Paternal Aunt      Breast Ca    Breast Cancer Paternal Aunt     Cancer Paternal Aunt      Breast Ca    Breast Cancer Paternal Aunt     Cancer Paternal Aunt      Breast Ca    Breast Cancer Paternal Aunt     Cancer Paternal Aunt      Breast Ca    Breast Cancer Paternal Aunt     Cancer Paternal Aunt      Breast Ca    Breast Cancer Paternal Aunt     Leukemia Paternal Uncle     Cancer Other 46     Breast Ca/Breast Ca    Breast Cancer Other     Leukemia Paternal Uncle        Social  History  Social History     Social History    Marital status: Divorced     Spouse name: N/A    Number of children: N/A    Years of education: N/A     Occupational History    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 Employed     occasionally     Social History Main Topics    Smoking status: Former Smoker     Packs/day: 2.00     Years: 40.00     Types: Cigarettes     Quit date: 01/22/2005    Smokeless tobacco: Never Used    Alcohol use No    Drug use: No    Sexual activity: Not on file          Other Topics Concern    Uses Cane No    Uses Walker No    Uses Wheelchair No    Right Hand Dominant Yes    Left Hand Dominant No    Ambidextrous No    Ability To Walk 1 Flight Of Steps Without Sob/Cp Yes    Routine Exercise Yes     walk    Ability To Walk 2 Flight Of Steps Without Sob/Cp Yes    Ability To Do Own Adl's Yes    Shift Work No    Unusual Sleep-Wake Schedule No     Social History Narrative     Prior to Admission Medications:  Prior to Admission Medications   Prescriptions Last Dose Informant Patient Reported? Taking?   Azelaic Acid 20 % Cream   No Yes   Sig: Apply topically Twice daily   Clobetasol (TEMOVATE) 0.05 % Ointment   No Yes   Sig: Apply topically Twice daily To rash on hands and feet   Halobetasol Propionate (ULTRAVATE) 0.05 % Cream  Patient No Yes   Sig: Apply  topically. Three times a week   Metronidazole (METROCREAM) 0.75 % Cream   No Yes   Sig: Apply topically Twice daily   Sulfacetamide Sodium-Sulfur 10-5 % (w/w) Cleanser   No Yes   Sig: 1 Squirt by Apply externally route Once a day   atenolol (TENORMIN) 25 mg Oral Tablet   No Yes   Sig: Take 1 Tab (25 mg total) by mouth Once a day   buPROPion (WELLBUTRIN XL) 300 mg Oral Tablet Sustained Release 24 hr   No Yes   Sig: TAKE 1 TAB (300 MG TOTAL) BY MOUTH EVERY MORNING   gabapentin (NEURONTIN) 300 mg Oral Capsule   No Yes   Sig: Take 2 Caps (600 mg total) by mouth Three times a day   Patient taking differently: Take 300 mg by mouth Three times a day    mineral oil-hydrophil petrolat (AQUAPHOR) Ointment   No Yes   Sig: by Apply Topically route Four times a day as needed   nystatin (NYSTOP) 100,000 unit/gram Powder   No Yes   Sig: Apply once or twice daily to PREVENT rash under breasts   pantoprazole (PROTONIX) 40 mg Oral Tablet, Delayed Release (E.C.)   No Yes   Sig: Take 1 Tab (40 mg total) by mouth Every morning before breakfast   raNITIdine (ZANTAC) 15 mg/mL Oral Syrup   No Yes   Sig: Take 10 mL (150 mg total) by mouth Every  night   riboflavin, vitamin B2, (VITAMIN B-2) 100 mg Oral Tablet   No Yes   Sig: Take 4 Tabs (400 mg total) by mouth Once a day   selenium sulfide (  SELSUN) 2.5 % Lotion   No Yes   Sig: 1 Squirt by Apply Topically route Every 7 days   triamcinolone acetonide (ARISTOCORT A) 0.1 % Ointment   No Yes   Sig: by Apply Topically route Twice daily      Facility-Administered Medications: None       Inpatient Medications:    Current Facility-Administered Medications:  acetaminophen (TYLENOL) tablet 325 mg Oral Q6H PRN   albuterol (PROVENTIL) 2.5 mg / 3 mL (0.083%) neb solution 2.5 mg Nebulization Q4H PRN   aspirin chewable tablet 81 mg 81 mg Oral Daily   atropine 0.4 mg/mL injection 0.4 mg Intravenous Once PRN   buPROPion (WELLBUTRIN SR) sustained release tablet 300 mg Oral Daily   enoxaparin PF (LOVENOX) 40 mg/0.4 mL SubQ injection 40 mg Subcutaneous Q24H   famotidine (PEPCID) tablet 20 mg Oral 2x/day   gabapentin (NEURONTIN) capsule 300 mg Oral 3x/day   gabapentin (NEURONTIN) capsule 300 mg Oral QPM   ibuprofen (MOTRIN) tablet 400 mg Oral Once   nitroGLYCERIN (NITROSTAT) sublingual tablet 0.4 mg Sublingual Q5 Min PRN   NS flush syringe 2 mL Intracatheter Q8HRS   And      NS flush syringe 2-6 mL Intracatheter Q1 MIN PRN   NS flush syringe 2 mL Intracatheter Q8HRS   And      NS flush syringe 2-6 mL Intracatheter Q1 MIN PRN   nystatin (NYSTOP) 100,000 units/g topical powder  Apply Topically 2x/day   pantoprazole (PROTONIX) delayed release tablet 40 mg Oral Daily before Breakfast   regadenoson (LEXISCAN) injection 0.4 mg 0.4 mg Intravenous Give in Cardiology   riboflavin (VITAMIN B-2) tablet 400 mg Oral Daily   selenium sulfide (SELSUN) 2.5% topical lotion 1 Squirt Apply Topically Q7 Days   traMADol (ULTRAM) tablet 50 mg Oral Q6H PRN       ROS: Other than ROS in the HPI, all other systems were negative.    Exam:  Vitals:    02/20/16 0314 02/20/16 0736 02/20/16 1526 02/20/16 1924   BP: 100/68 100/68 114/77 95/66   Pulse: 66 61 79  75   Resp: 17 18 18 18    Temp: 36.4 C (97.6 F) 36.6 C (97.9 F) 37 C (98.6 F) 36.9 C (98.5 F)   SpO2: 98%   97%   Weight:       Height:         Body mass index is 36.5 kg/(m^2).    General: mildly obese  Eyes: Conjunctiva clear.  HENT:Head atraumatic and normocephalic  Neck: No JVD or thyromegaly or lymphadenopathy  Lungs: Clear to auscultation bilaterally.   Cardiovascular: regular rate and rhythm, S1, S2 normal, no murmur, click, rub or gallop  Abdomen: Soft, non-tender, Bowel sounds normal  Extremities: No cyanosis or edema  Skin: Skin warm and dry  Neurologic: Grossly normal  Psychiatric: Normal affect, behavior, memory, thought content, judgement, and speech.    LABS:   Lab Results   Component Value Date    INR 0.94 02/19/2016    INR 1.0 11/10/2012       Lab Results   Component Value Date    HA1C 5.4 02/19/2016    HA1C 5.9 07/01/2010       Lab Results   Component Value Date    TRIG 193 (H) 02/19/2016    TRIG 190 (H) 12/25/2014    HDLCHOL 29 (L) 02/19/2016    HDLCHOL 34 (L) 12/25/2014    LDLCHOL 125 (H) 02/19/2016    LDLCHOL 122 (H)  12/25/2014    CHOLESTEROL 193 02/19/2016    CHOLESTEROL 194 12/25/2014       Thyroid Studies    Lab Results   Component Value Date/Time    TSH 0.769 02/19/2016 11:50 AM    TSH 0.904 10/14/2008 11:37 AM    No results found for: T3, FREET3, FRET3, MTUP1, MTBGS1        Lab Results for Last 24 Hours:  Results for orders placed or performed during the hospital encounter of 02/19/16 (from the past 24 hour(s))   PHOSPHORUS   Result Value Ref Range    PHOSPHORUS 4.7 2.4 - 4.7 mg/dL   MAGNESIUM   Result Value Ref Range    MAGNESIUM 2.3 1.6 - 2.5 mg/dL   BASIC METABOLIC PANEL, NON-FASTING   Result Value Ref Range    SODIUM 140 136 - 145 mmol/L    POTASSIUM 3.7 3.5 - 5.1 mmol/L    CHLORIDE 110 96 - 111 mmol/L    CO2 TOTAL 22 22 - 32 mmol/L    ANION GAP 8 4 - 13 mmol/L    CALCIUM 9.0 8.5 - 10.4 mg/dL    GLUCOSE 110 65 - 139 mg/dL    BUN 15 8 - 25 mg/dL    CREATININE 1.19 (H) 0.49 - 1.10  mg/dL    BUN/CREA RATIO 13 6 - 22    ESTIMATED GFR 50 (L) >59 mL/min/1.46m2   CBC WITH DIFF   Result Value Ref Range    WBC 7.4 3.5 - 11.0 x103/uL    RBC 4.58 3.63 - 4.92 x106/uL    HGB 13.5 11.2 - 15.2 g/dL    HCT 40.0 33.5 - 45.2 %    MCV 87.3 78.0 - 100.0 fL    MCH 29.4 27.4 - 33.0 pg    MCHC 33.7 32.5 - 35.8 g/dL    RDW 14.5 12.0 - 15.0 %    PLATELETS 311 140 - 450 x103/uL    MPV 7.2 (L) 7.5 - 11.5 fL    NEUTROPHIL % 45 %    LYMPHOCYTE % 43 %    MONOCYTE % 8 %    EOSINOPHIL % 3 %    BASOPHIL % 1 %    NEUTROPHIL # 3.34 1.50 - 7.70 x103/uL    LYMPHOCYTE # 3.14 1.00 - 4.80 x103/uL    MONOCYTE # 0.62 0.30 - 1.00 x103/uL    EOSINOPHIL # 0.23 0.00 - 0.50 x103/uL    BASOPHIL # 0.04 0.00 - 0.20 x103/uL       Christean Leaf, MD  Fellow, Section of Cardiovascular Medicine  [facility]    Late entry for 02/20/16. I saw and examined the patient.  I reviewed the resident's note.  I agree with the findings and plan of care as documented in the resident's note.  Any exceptions/additions are edited/noted.    Sammuel Cooper, MD

## 2016-02-20 NOTE — Care Management Notes (Signed)
.  Hospital Discharge Follow up made 02/23/2016 at 10:10 with PCP.  Added to discharge summary for pt review/notification

## 2016-02-20 NOTE — Nurses Notes (Signed)
Resting in bed No signs of distress noted Will cont to monitor

## 2016-02-20 NOTE — Nurses Notes (Signed)
Pt asleep in bed. Telemetry in place. Breathing unlabored. Fall precautions maintained. Call bell within reach. Will continue to monitor.

## 2016-02-20 NOTE — Progress Notes (Addendum)
[facility]  Medicine Progress Note    [name]  Date of service: 02/20/2016  Date of Admission: 02/19/2016  Hospital Day:  LOS: 0 days     CC: Chest pain      Subjective   Interval Changes   ROS:   No acute events overnight. Patient complained of continued intermittent CP with SOB as well as headache that she believes is due to lack of caffeine intake. Denies acute vision changes, neck discomfort, fever, chills, cough, chest pain,     Objective:   Vital Signs:  Filed Vitals:    02/19/16 2127 02/19/16 2129 02/19/16 2327 02/20/16 0314   BP: 111/73 109/78 102/61 100/68   Pulse: 68 68 61 66   Resp: 17 19 18 17    Temp:   36.5 C (97.7 F) 36.4 C (97.6 F)   SpO2:   98% 98%       Input/Output  04/27 0000 - 04/27 2359  In: -   Out: 500 [Urine:500]      Current Facility-Administered Medications:  acetaminophen (TYLENOL) tablet 325 mg Oral Q6H PRN   albuterol (PROVENTIL) 2.5 mg / 3 mL (0.083%) neb solution 2.5 mg Nebulization Q4H PRN   aspirin chewable tablet 81 mg 81 mg Oral Daily   atropine 0.4 mg/mL injection 0.4 mg Intravenous Once PRN   buPROPion (WELLBUTRIN SR) sustained release tablet 300 mg Oral Daily   enoxaparin PF (LOVENOX) 40 mg/0.4 mL SubQ injection 40 mg Subcutaneous Q24H   famotidine (PEPCID) tablet 20 mg Oral 2x/day   gabapentin (NEURONTIN) capsule 300 mg Oral 3x/day   gabapentin (NEURONTIN) capsule 300 mg Oral QPM   nitroGLYCERIN (NITROSTAT) sublingual tablet 0.4 mg Sublingual Q5 Min PRN   NS flush syringe 2 mL Intracatheter Q8HRS   And      NS flush syringe 2-6 mL Intracatheter Q1 MIN PRN   NS flush syringe 2 mL Intracatheter Q8HRS   And      NS flush syringe 2-6 mL Intracatheter Q1 MIN PRN   nystatin (NYSTOP) 100,000 units/g topical powder  Apply Topically 2x/day   pantoprazole (PROTONIX) delayed release tablet 40 mg Oral Daily before Breakfast   perflutren lipid microspheres (DEFINITY) 1.1 mg/mL injection 0.3 mL Intravenous Give in Cardiology   riboflavin (VITAMIN B-2)  tablet 400 mg Oral Daily   selenium sulfide (SELSUN) 2.5% topical lotion 1 Squirt Apply Topically Q7 Days   traMADol (ULTRAM) tablet 50 mg Oral Q6H PRN       Physical Exam:   General: appears chronically ill but in NAD   HEENT: NCAT, no scleral icterus, no conjunctival injection, MMM  Neck: supple, FROM, no thyromegaly, no LAD   Lungs: normal WOB, CTAB   Cardiovascular: RRR, no m/r/g, 2+ distal pulses   Abdomen: +BS, soft, ND, NT   Extremities: dry, warm, no c/c/e  Neurologic: A&Ox3, no focal defects appreciated    Labs:  CBC Results Differential Results   Recent Results (from the past 30 hour(s))   CBC WITH DIFF    Collection Time: 02/20/16  4:29 AM   Result Value    WBC 7.4    HGB 13.5    HCT 40.0    PLATELETS 311    Recent Results (from the past 30 hour(s))   CBC WITH DIFF    Collection Time: 02/20/16  4:29 AM   Result Value    WBC 7.4    NEUTROPHIL % 45    LYMPHOCYTE % 43    MONOCYTE %  8    EOSINOPHIL % 3    BASOPHIL % 1    BASOPHIL # 0.04      BMP Results Other Chemistries Results   Results for orders placed or performed during the hospital encounter of 02/19/16 (from the past 30 hour(s))   BASIC METABOLIC PANEL, NON-FASTING    Collection Time: 02/20/16  4:29 AM   Result Value    SODIUM 140    POTASSIUM 3.7    CHLORIDE 110    CO2 TOTAL 22    GLUCOSE 110    BUN 15    CREATININE 1.19 (H)    Recent Results (from the past 30 hour(s))   MAGNESIUM    Collection Time: 02/20/16  4:29 AM   Result Value    MAGNESIUM 2.3   PHOSPHORUS    Collection Time: 02/20/16  4:29 AM   Result Value    PHOSPHORUS 4.7      Liver/Pancreas Enzyme Results Liver Function Results   Recent Results (from the past 30 hour(s))   LIPASE    Collection Time: 02/19/16 11:50 AM   Result Value    LIPASE 56    No results found for this or any previous visit (from the past 30 hour(s)).   Cardiac Results Coags Results   Results for orders placed or performed during the hospital encounter of 02/19/16 (from the past 30 hour(s))   TROPONIN-I    Collection  Time: 02/19/16  8:36 PM   Result Value    TROPONIN I 13   B-TYPE NATRIURETIC PEPTIDE    Collection Time: 02/19/16 11:51 AM   Result Value    BNP <10    Recent Results (from the past 30 hour(s))   D-DIMER    Collection Time: 02/19/16 11:50 AM   Result Value    D-DIMER 502 (H)   PTT (PARTIAL THROMBOPLASTIN TIME)    Collection Time: 02/19/16 11:50 AM   Result Value    APTT 29.1   PT/INR    Collection Time: 02/19/16 11:50 AM   Result Value    PROTHROMBIN TIME 10.6    INR 0.94      , Last 24 Hr Fingerstics:  No results for input(s): GLUCOSEPOC, GLUIP in the last 24 hours.    Radiology:    Results for orders placed or performed during the hospital encounter of 02/19/16 (from the past 24 hour(s))   XR AP MOBILE CHEST     Status: None    Narrative    EXAMINATION: AP chest radiograph 19 February 2016.  Comparison study is from   16 February 2016.    INDICATION: Chest pain    FINDINGS: The lungs demonstrate no focal consolidation, effusion,   pneumothorax or concerning nodules.  The cardiomediastinal silhouette is   unremarkable.  The osseous structures are unremarkable as well.      Impression     No acute thoracic abnormality.       CT CHEST FOR PULMONARY EMBOLUS W IV CONTRAST     Status: None    Narrative    EXAMINATION: CT of the chest with intravenous contrast (80 mL Isovue-370,   PE protocol) performed on 19 February 2016.    INDICATION: Chest pain, back pain, shortness of breath, elevated d-dimer    FINDINGS: No acute parenchymal abnormalities are identified in the lungs.    Multiple small, subcentimeter (3-4 mm), nonspecific, peripheral,   subpleuralnoncalcified pulmonary nodules are noted in the right middle   lobe (series 3 images 81 and  83) and in the left upper lobe (series 3   image 49).  The heart and other mediastinal structures are unremarkable.    No filling defects are identified in the pulmonary arteries to indicate   pulmonary emboli.  No lymphadenopathy is appreciated in the chest.  The   thyroid gland is  unremarkable.  There is a partially-imaged mass in the   upper portion of the left breast which is incompletely evaluated on this   study, but was demonstrated to represent a cyst on a breast ultrasound in   November 2015.  Limited assessment of the upper abdomen reveals no masses   or acute abnormalities of the included viscera.  The gallbladder is   surgically absent.  Evaluation of the osseous structures reveals no acute   abnormalities or any concerning lytic or sclerotic lesions.      Impression    1.  No evidence of PE or other acute thoracic abnormality.  2.  Nonspecific small subcentimeter pulmonary nodules measuring 3-4 mm.    If patient is considered at high risk for these nodules being malignant,   consider followup chest CT in 12 months.  If low risk, no followup is   required.  3.  Partially-imaged left breast mass.  This likely corresponds to the   cyst seen on breast ultrasound in November 2015.             Assessment/Plan:   [name] is a 60 y.o. female with PMH of mild CAD, HLD, rheumatic fever, GERD, depression, & arthritis who presents with c/o acute onset pleuritic chest pain a/w DOE, and wheezing    Pleuritic Chest Pain, DOE, Wheezing   Hx of Mild CAD   HLD  - DDx: possible underlying COPD with exacerbation, PUD, gastritis, GERD. Less likely ACS, PE, pancreatitis  - TIMI score 1 indicating 3-5% risk of MI/MI related death/urgent revascularization within the next 2 weeks   - Troponin: initial 19, 18,13. BNP <10. VBG unremarkable. Lipase WNL. D-dimer was checked in the ED & found to be elevated (502) but CT PE was neg for thromboembolic event  - [id number], lipid panel noted for dyslipidemia (LDL 125, HDL 29, TC 193), resp panel studies WNL, procalcitonin WNL, UDS & Tox screen unremarkable  * Although the CT PE showed no PE, it did show small pulm nodules (3-107mm), pt will need f/u CT chest in 12 months given pt's high risk (significant smoking hx w/in past 15 yrs)  - EKG showed no  ischemic changes (showed sinus bradycardia: 50 bpm)  - CXR shows no acute changes   - LHC (11/10/12): mild CAD (largest lesion 20-30% in the OM1) with normal LV function  - ASA 81 mg, prn SL nitro. Had myalgia with atorvastatin (even at low dose)  * Holding the atenolol given bradycardia  - Albuterol nebs, incentive spirometry   * Plan for stress test today    Dysuria, Urinary Frequency & urgency  - Afebrile, physical exam unremarkable  - CBC & BMP unremarkable. UA unremarkable. BCx NGTD  - Will continue to monitor    GERD: continue home Protonix 40 mg qday & Zantac  Depression: bupropion 200 mg qday  Peripheral Neuropathy: gabapentin 600 mg TID  Migraines: continue the atenolol   Intertrigo: nystatin powder  Vulvitis: holding halobetasol propionate cream at this time    DVT/PE Prophylaxis: Lovenox  Disposition Planning: Home discharge      Tia Alert, MD   02/20/2016 07:37   Internal  Medicine, PGY3   [facility]         I saw and examined the patient.  I reviewed the resident's note.  I agree with the findings and plan of care as documented in the resident's note.  Any exceptions/additions are edited/noted.      Await result of stress test.  If neg for inducible ischemia, plan for outpatient PCP follow-up.    Baxter Hire, MD  02/20/2016, 17:05  Assistant Professor  Pager [phone]  Department of Internal Medicine, Section Hospitalist Medicine

## 2016-02-21 LAB — CBC WITH DIFF
BASOPHIL #: 0.09 x10ˆ3/uL (ref 0.00–0.20)
BASOPHIL %: 1 %
EOSINOPHIL #: 0.22 x10ˆ3/uL (ref 0.00–0.50)
EOSINOPHIL %: 3 %
HCT: 39.3 % (ref 33.5–45.2)
HGB: 13.4 g/dL (ref 11.2–15.2)
LYMPHOCYTE #: 3.24 x10ˆ3/uL (ref 1.00–4.80)
LYMPHOCYTE %: 41 %
MCH: 29.6 pg (ref 27.4–33.0)
MCHC: 34 g/dL (ref 32.5–35.8)
MCV: 87 fL (ref 78.0–100.0)
MONOCYTE #: 0.78 x10ˆ3/uL (ref 0.30–1.00)
MONOCYTE %: 10 %
MPV: 7.2 fL — ABNORMAL LOW (ref 7.5–11.5)
NEUTROPHIL #: 3.6 x10ˆ3/uL (ref 1.50–7.70)
NEUTROPHIL %: 45 %
PLATELETS: 324 x10ˆ3/uL (ref 140–450)
RBC: 4.52 x10ˆ6/uL (ref 3.63–4.92)
RDW: 14.2 % (ref 12.0–15.0)
WBC: 7.9 x10ˆ3/uL (ref 3.5–11.0)

## 2016-02-21 LAB — BASIC METABOLIC PANEL
ANION GAP: 8 mmol/L (ref 4–13)
ANION GAP: 8 mmol/L (ref 4–13)
BUN/CREA RATIO: 11 (ref 6–22)
BUN: 16 mg/dL (ref 8–25)
CALCIUM: 8.8 mg/dL (ref 8.5–10.4)
CHLORIDE: 106 mmol/L (ref 96–111)
CO2 TOTAL: 24 mmol/L (ref 22–32)
CREATININE: 1.41 mg/dL — ABNORMAL HIGH (ref 0.49–1.10)
ESTIMATED GFR: 41 mL/min/1.73mˆ2 — ABNORMAL LOW (ref >59)
GLUCOSE: 104 mg/dL (ref 65–139)
POTASSIUM: 3.9 mmol/L (ref 3.5–5.1)
SODIUM: 138 mmol/L (ref 136–145)

## 2016-02-21 LAB — MAGNESIUM: MAGNESIUM: 2.3 mg/dL (ref 1.6–2.5)

## 2016-02-21 LAB — PHOSPHORUS: PHOSPHORUS: 4.2 mg/dL (ref 2.4–4.7)

## 2016-02-21 MED ORDER — NITROGLYCERIN 0.4 MG SUBLINGUAL TABLET
0.40 mg | SUBLINGUAL_TABLET | SUBLINGUAL | 0 refills | Status: DC | PRN
Start: 2016-02-21 — End: 2022-11-29

## 2016-02-21 MED ORDER — ASPIRIN 81 MG CHEWABLE TABLET
81.0000 mg | CHEWABLE_TABLET | Freq: Every day | ORAL | 11 refills | Status: DC
Start: 2016-02-21 — End: 2018-11-24

## 2016-02-21 MED ORDER — SODIUM CHLORIDE 0.9 % INTRAVENOUS SOLUTION
INTRAVENOUS | Status: DC
Start: 2016-02-21 — End: 2016-02-21
  Administered 2016-02-21: 0 via INTRAVENOUS

## 2016-02-21 MED ORDER — POTASSIUM CHLORIDE ER 20 MEQ TABLET,EXTENDED RELEASE(PART/CRYST)
20.0000 meq | ORAL_TABLET | ORAL | Status: AC
Start: 2016-02-21 — End: 2016-02-21
  Administered 2016-02-21: 20 meq via ORAL
  Filled 2016-02-21: qty 1

## 2016-02-21 MED ADMIN — sodium chloride 0.9 % (flush) injection syringe: @ 06:00:00

## 2016-02-21 MED ADMIN — electrolyte-A intravenous solution: @ 06:00:00 | NDC 00338022104

## 2016-02-21 MED ADMIN — sodium chloride 0.9 % (flush) injection syringe: ORAL | @ 08:00:00

## 2016-02-21 NOTE — Nurses Notes (Signed)
Started pt on IV NS at 125.   Pt tolerated breakfast well. Will continue to monitor.

## 2016-02-21 NOTE — Nurses Notes (Signed)
Pt resting in bed between care. Requested a cup of coffee.   Pt reporting no CP, SOB, N/V.   Pt requesting a stool softener or laxative; last BM 4/26, will page service.  Pt appears anxious. Worried about her MPS.   Will continue to monitor pt.

## 2016-02-21 NOTE — Nurses Notes (Signed)
0600  Pt heart rate has been in the 70-80 most of the night and she states the chest pain has been better, no other complaints, CWRN

## 2016-02-21 NOTE — Nurses Notes (Signed)
Discharge orders received. Pt discharged to home. Reviewed with pt discharge summary, follow up care, and prescription orders. Patient verbalized understanding. No additional questions. PIV removed from right AC. Catheter intact.  Pt will leave unit in stable condition via wheel chair.

## 2016-02-21 NOTE — Nurses Notes (Signed)
0145  Pt resting eyes closed, CWRN

## 2016-02-21 NOTE — Nurses Notes (Signed)
0345  Pt still resting eyes closed, no complaints, CWRN

## 2016-02-21 NOTE — Discharge Summary (Addendum)
DISCHARGE SUMMARY      PATIENT NAME:  [name], [name]  MRN:  [id number]  DOB:  [birth year]    ADMISSION DATE:  02/19/2016  DISCHARGE DATE:  02/21/2016    ATTENDING PHYSICIAN: Baxter Hire, MD  SERVICE: MED HOSPITALIST 4  PRIMARY CARE PHYSICIAN: [name], MD     Reason for Admission     Diagnosis    Chest pain [125822]          DISCHARGE DIAGNOSIS:     Principle Problem:  Chest pain    Active Hospital Problems    Diagnosis Date Noted    Principle Problem: Chest pain 02/19/2016    Dysuria 02/20/2016    Urinary frequency 02/19/2016      Resolved Hospital Problems    Diagnosis    No resolved problems to display.     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    Otitis media of both ears 09/02/2013    Hyperlipidemia LDL goal < 100 08/29/2013    MINOR CAD (coronary artery disease) 11/10/2012    Left knee pain 10/04/2012    Chest pain at rest 10/04/2012    Osteoarthritis, knee 09/07/2011    Carpal tunnel syndrome on right 12/15/2010    Well woman exam [id number]    Lichen sclerosus et atrophicus of the vulva 06/04/2010    Neck pain 02/11/2010    Microscopic hematuria 10/10/2008    Multinodular goiter     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       Details    aspirin 81 mg Tablet, Chewable    81 mg, Oral, Daily   Qty:  30 Tab   Refills:   11       nitroGLYCERIN 0.4 mg Tablet, Sublingual   Commonly known as:  NITROSTAT    0.4 mg, Sublingual, Q5 Min PRN, for 3 doses over 15 minutes   Qty:  20 Tab   Refills:  0         CONTINUE these medications which have CHANGED during your visit.       Details    gabapentin 300 mg Capsule   Commonly known as:  NEURONTIN   What changed:  how much to take    600 mg, Oral, 3x/day   Qty:  180 Cap   Refills:  3         CONTINUE these medications - NO CHANGES were made during your visit.       Details    Azelaic Acid 20 % Cream    Topical, 2x/day   Qty:  50 g   Refills:  0       buPROPion 300 mg Tablet Sustained Release 24 hr   Commonly known as:  WELLBUTRIN XL    TAKE 1 TAB (300 MG TOTAL) BY MOUTH EVERY MORNING   Qty:  30 Tab   Refills:  2       clobetasol 0.05 % Ointment   Commonly known as:  TEMOVATE    Topical, 2x/day, To rash on hands and feet   Qty:  60 g   Refills:  0       Halobetasol Propionate 0.05 % Cream   Commonly known as:  ULTRAVATE    Apply  topically. Three times a week   Qty:  15 g   Refills:  2       Metronidazole 0.75 % Cream   Commonly known as:  METROCREAM    Topical, 2x/day   Qty:  45 g   Refills:  1       mineral oil-hydrophil petrolat Ointment   Commonly known as:  AQUAPHOR    Apply Topically, 4x/day PRN   Qty:  50 g   Refills:  0       nystatin 100,000 unit/gram Powder   Commonly known as:  NYSTOP    Apply once or twice daily to PREVENT rash under breasts   Qty:  60 g   Refills:  11       pantoprazole 40 mg Tablet, Delayed Release (E.C.)   Commonly known as:  PROTONIX    40 mg, Oral, Daily before Breakfast   Qty:  30 Tab   Refills:  11       raNITIdine 15 mg/mL Syrup   Commonly known as:  ZANTAC    150 mg, Oral, NIGHTLY   Qty:  500 mL   Refills:  5       riboflavin (vitamin B2) 100 mg Tablet   Commonly known as:  VITAMIN B-2    400 mg, Oral, Daily   Qty:  120 Tab   Refills:  11       selenium sulfide 2.5 % Lotion   Commonly known as:  SELSUN    1 Squirt, Apply Topically, Q7 Days   Qty:  118 mL      Refills:  2       Sulfacetamide Sodium-Sulfur 10-5 % (w/w) Cleanser    1 Squirt, Apply externally, Daily   Qty:  227 g   Refills:  2       triamcinolone acetonide 0.1 % Ointment   Commonly known as:  ARISTOCORT A    Apply Topically, 2x/day   Qty:  80 g   Refills:  2         STOP taking these medications.          atenolol 25 mg Tablet   Commonly known as:  TENORMIN           Discharge med list refreshed?  YES                DISCHARGE INSTRUCTIONS:  Follow-up Information     Follow up with [name], MD On 02/23/2016.    Specialty:  Family Practice    Why:  Hospital discharge follow up at 10:10am    Contact information:    [address]  [id number]  [place] [address]  [phone]          No discharge procedures on file.       [place]  COURSE:    [name] is a 60 y.o. female with PMH of mild CAD, HLD, rheumatic fever, GERD, depression, & arthritis who presents with c/o acute onset pleuritic chest pain a/w DOE, and wheezing. Cardiac enzymes, TTE, VBG, BNP, & CXR were unremarkable. EKG & telemetry was noted for bradycardia and thus atenolol was held (pateint also instructed to discontinue use of the beta-blocker until further notice form her PCP). MPS showed mildly decrease anterior apical wall activity with shifting breast attenuation artifact but can not rule out mild ischemia. Cardiology was consulted and they believed the pt's symptoms to be unlikely related to underlying CAD but possibly due to pericarditis and suggested trending CRP which was WNL. She was monitored on telemetry and remained stable without any acute events. CT PE protocol was also obtained which showed no PE. However, it did reveal small pulmonary nodules (3-78mm) and given the pt's high risk for malignancy (significant smoking hx w/in past 15 yrs),  She will need f/u CT chest in 12 months. There was also some concern for underlying undertreated/undiagnosed COPD, the work-up for which  will be deferred to her PCP.     [name] was discharged home on 02/21/16 with plan to follow-up with her primary care physician within the next 1-2 weeks. Additionally she was told to seek medical care if symptoms returned, persisted, worsened, or new symptoms arose. The plan was discussed with the patient and she he expressed understanding and all her questions and concern were immediately & appropriately addressed.       CONDITION ON DISCHARGE:  A. Ambulation: Full ambulation  B. Self-care Ability: Complete  C. Cognitive Status Alert and Oriented x 3  D. DNR status at discharge: Full Code    Advance Directive Information       Most Recent Value    Does the Patient have an Advance Directive? No, Information Offered and Refused          DISCHARGE DISPOSITION:  Home discharge      Tia Alert, MD   02/21/2016 11:55   Internal Medicine, PGY3   [facility]       Copies sent to Care Team       Relationship Specialty Notifications Start End    [name], MD PCP - General   01/09/07     Phone: [phone] Fax: [phone]         [id number] [place] [address]    [name], MD PCP - Managed Care/Insurance Family Practice  02/11/16     Phone: [phone] Fax: [phone]         [address] [id number]          Referring providers can utilize [email or web address] to access their referred [facility] patient's information.        I have seen and examined patient on day of discharge and agree with discharge summary as written.  Baxter Hire, MD  02/21/2016, 19:29  Assistant Professor  Pager [phone]  Department of Internal Medicine, Section Hospitalist Medicine

## 2016-02-21 NOTE — Progress Notes (Addendum)
[facility]  Medicine Progress Note    [name]  Date of service: 02/21/2016  Date of Admission: 02/19/2016  Hospital Day:  LOS: 0 days     CC: Chest pain      Subjective   Interval Changes   ROS:   No acute events overnight. Patient feeling better today with no chest pain or SOB. Also denies cough, fever, chills, N/V    Objective:   Vital Signs:  Filed Vitals:    02/21/16 0030 02/21/16 0242 02/21/16 0807 02/21/16 0825   BP:  93/60  108/67   Pulse:  64 75 73   Resp:  18 16 18    Temp:  36.7 C (98 F)  36.7 C (98.1 F)   SpO2: 97% 98% 98% 99%       Input/Output  04/28 0000 - 04/28 2359  In: 242 [P.O.:240; I.V.:2]  Out: 500 [Urine:500]      Current Facility-Administered Medications:  acetaminophen (TYLENOL) tablet 325 mg Oral Q6H PRN   albuterol (PROVENTIL) 2.5 mg / 3 mL (0.083%) neb solution 2.5 mg Nebulization Q4H PRN   aspirin chewable tablet 81 mg 81 mg Oral Daily   atropine 0.4 mg/mL injection 0.4 mg Intravenous Once PRN   buPROPion (WELLBUTRIN SR) sustained release tablet 300 mg Oral Daily   enoxaparin PF (LOVENOX) 40 mg/0.4 mL SubQ injection 40 mg Subcutaneous Q24H   famotidine (PEPCID) tablet 20 mg Oral 2x/day   gabapentin (NEURONTIN) capsule 300 mg Oral 3x/day   gabapentin (NEURONTIN) capsule 300 mg Oral QPM   ibuprofen (MOTRIN) tablet 400 mg Oral Once   nitroGLYCERIN (NITROSTAT) sublingual tablet 0.4 mg Sublingual Q5 Min PRN   NS flush syringe 2 mL Intracatheter Q8HRS   And      NS flush syringe 2-6 mL Intracatheter Q1 MIN PRN   NS flush syringe 2 mL Intracatheter Q8HRS   And      NS flush syringe 2-6 mL Intracatheter Q1 MIN PRN   NS premix infusion  Intravenous Continuous   nystatin (NYSTOP) 100,000 units/g topical powder  Apply Topically 2x/day   pantoprazole (PROTONIX) delayed release tablet 40 mg Oral Daily before Breakfast   potassium chloride (K-DUR) extended release tablet 20 mEq Oral Now   regadenoson (LEXISCAN) injection 0.4 mg 0.4 mg Intravenous Give in Cardiology     riboflavin (VITAMIN B-2) tablet 400 mg Oral Daily   selenium sulfide (SELSUN) 2.5% topical lotion 1 Squirt Apply Topically Q7 Days   traMADol (ULTRAM) tablet 50 mg Oral Q6H PRN       Physical Exam:   General: appears chronically ill but in NAD   HEENT: NCAT, no scleral icterus, no conjunctival injection, MMM  Neck: supple, FROM, no thyromegaly, no LAD   Lungs: normal WOB, CTAB   Cardiovascular: RRR, no m/r/g, 2+ distal pulses   Abdomen: +BS, soft, ND, NT   Extremities: dry, warm, no c/c/e  Neurologic: A&Ox3, no focal defects appreciated    Labs:  CBC Results Differential Results   Recent Results (from the past 30 hour(s))   CBC WITH DIFF    Collection Time: 02/21/16  5:42 AM   Result Value    WBC 7.9    HGB 13.4    HCT 39.3    PLATELETS 324    Recent Results (from the past 30 hour(s))   CBC WITH DIFF    Collection Time: 02/21/16  5:42 AM   Result Value    WBC 7.9    NEUTROPHIL %  45    LYMPHOCYTE % 41    MONOCYTE % 10    EOSINOPHIL % 3    BASOPHIL % 1    BASOPHIL # 0.09      BMP Results Other Chemistries Results   Results for orders placed or performed during the hospital encounter of 02/19/16 (from the past 30 hour(s))   BASIC METABOLIC PANEL    Collection Time: 02/21/16  5:42 AM   Result Value    SODIUM 138    POTASSIUM 3.9    CHLORIDE 106    CO2 TOTAL 24    GLUCOSE 104    BUN 16    CREATININE 1.41 (H)    Recent Results (from the past 30 hour(s))   PHOSPHORUS    Collection Time: 02/21/16  5:42 AM   Result Value    PHOSPHORUS 4.2   MAGNESIUM    Collection Time: 02/21/16  5:42 AM   Result Value    MAGNESIUM 2.3      Liver/Pancreas Enzyme Results Liver Function Results   No results found for this or any previous visit (from the past 30 hour(s)). No results found for this or any previous visit (from the past 30 hour(s)).   Cardiac Results Coags Results   No results found for this or any previous visit (from the past 30 hour(s)). No results found for this or any previous visit (from the past 30 hour(s)).   , Last 24  Hr Fingerstics:  No results for input(s): GLUCOSEPOC, GLUIP in the last 24 hours.    Radiology:           Assessment/Plan:   Anibal Zingale is a 60 y.o. female with PMH of mild CAD, HLD, rheumatic fever, GERD, depression, & arthritis who presents with c/o acute onset pleuritic chest pain a/w DOE, and wheezing     Pleuritic Chest Pain, DOE, Wheezing   Hx of Mild CAD   HLD  - DDx: pericarditis, possible underlying COPD with exacerbation. Less likely d/t PUD, gastritis, GERD. Much less likely d/t ACS, PE, pancreatitis  - TIMI score 1 indicating 3-5% risk of MI/MI related death/urgent revascularization within the next 2 weeks   - Troponin: initial 19, 18,13. BNP <10. VBG unremarkable. Lipase WNL. D-dimer was checked in the ED & found to be elevated (502) but CT PE was neg for thromboembolic event  * CRP WNL (7.1)  - HbgA1C 5.4%, lipid panel noted for dyslipidemia (LDL 125, HDL 29, TC 193), resp panel studies WNL, procalcitonin WNL, UDS & Tox screen unremarkable  * Although the CT PE showed no PE, it did show small pulm nodules (3-79mm), pt will need f/u CT chest in 12 months given pt's high risk (significant smoking hx w/in past 15 yrs)  - EKG showed no ischemic changes (showed sinus bradycardia: 50 bpm)  - CXR shows no acute changes   - LHC (11/10/12): mild CAD (largest lesion 20-30% in the OM1) with normal LV function  - ASA 81 mg, prn SL nitro. Had myalgia with atorvastatin (even at low dose)  * Holding atenolol given bradycardia  - Albuterol nebs, incentive spirometry   * MPS: mildly decrease anterior apical wall activity with shifting breast attenuation artifact but can not rule out mild ischemia; LV EF 61%  * Cardiology consulted: unlikely to be 2/2 CAD but concern for possible pericarditis (trend CRP, consider NSAID for pain control).  No pericardial thickening on echo or CT scan therefore this is unlikely, resolved with 1 dose of  NSAID>  Suspect more likely GI etiology given epigastric location    Dysuria,  Urinary Frequency & urgency  - Afebrile, physical exam unremarkable  - CBC & BMP unremarkable. UA unremarkable. BCx NGTD  - Will continue to monitor    GERD: continue home Protonix 40 mg qday & Zantac  Depression: bupropion 200 mg qday  Peripheral Neuropathy: gabapentin 600 mg TID  Migraines: continue the atenolol   Intertrigo: nystatin powder  Vulvitis: holding halobetasol propionate cream at this time    DVT/PE Prophylaxis: Lovenox  Disposition Planning: Home discharge      Tia Alert, MD   02/21/2016 09:54   Internal Medicine, PGY3   [facility] of Medicine       I saw and examined the patient.  I reviewed the resident's note.  I agree with the findings and plan of care as documented in the resident's note.  Any exceptions/additions are edited/noted.    Discharge to home today.  Outpatient follow-up with PCP 02/23/16.    Baxter Hire, MD  02/21/2016, 13:56  Assistant Professor  Pager [phone]  Department of Internal Medicine, Section Hospitalist Medicine

## 2016-02-23 ENCOUNTER — Encounter (HOSPITAL_BASED_OUTPATIENT_CLINIC_OR_DEPARTMENT_OTHER): Payer: Self-pay | Admitting: Family Medicine

## 2016-02-23 ENCOUNTER — Ambulatory Visit: Payer: MEDICAID | Attending: Family Medicine | Admitting: Family Medicine

## 2016-02-23 VITALS — BP 131/70 | HR 65 | Temp 98.1°F | Ht 65.0 in | Wt 221.3 lb

## 2016-02-23 DIAGNOSIS — R918 Other nonspecific abnormal finding of lung field: Secondary | ICD-10-CM

## 2016-02-23 DIAGNOSIS — J40 Bronchitis, not specified as acute or chronic: Secondary | ICD-10-CM | POA: Insufficient documentation

## 2016-02-23 DIAGNOSIS — Z713 Dietary counseling and surveillance: Secondary | ICD-10-CM | POA: Insufficient documentation

## 2016-02-23 MED ORDER — BENZONATATE 100 MG CAPSULE
ORAL_CAPSULE | ORAL | 1 refills | Status: DC
Start: 2016-02-23 — End: 2016-08-06

## 2016-02-23 NOTE — Progress Notes (Signed)
ASSESSMENT:   PLAN:    ICD-10-CM    1. Bronchitis J40 benzonatate (TESSALON) 100 mg Oral Capsule (started new medication today, after discussion of expected benefits and potential side effects)  Discussed natural history of viral bronchitis, symptomatic treatment advised   2. Multiple pulmonary nodules determined by computed tomography of lung R91.8 CT CHEST WO IV CONTRAST recommended repeat in one year since she has 44 pk year smoking history       BMI addressed:   Advised on diet, weight loss, and exercise to reduce their above normal BMI.      Advised to call back directly if there are further questions, or if these symptoms fail to improve as anticipated or worsen.    SUBJECTIVE:  [name] is a 60 y.o. female is here today for Hospital Discharge Follow Up      Sudden onset cough and pleuritic chest pain  Dry hacky cough  No hx of prior chest colds but did have some sore throat, hoarseness  She went to the ED and was admitted to evaluate the chest pain Admitted 02/19/16 and discharged 02/21/16   MPS showed breast attenation changes only, looked negative  ECHO was normal  CT chest found small pulm nodules:  Multiple small, subcentimeter (3-4 mm), nonspecific, peripheral, subpleuralnoncalcified pulmonary nodules are noted in the right middle lobe (series 3 images 81 and 83) and in the left upper lobe (series 3 image 49). The heart and other mediastinal structures are unremarkable. No filling defects are identified in the pulmonary arteries to indicate pulmonary emboli. No lymphadenopathy is appreciated in the chest. The thyroid gland is unremarkable. There is a partially-imaged mass in the upper portion of the left breast which is incompletely evaluated on this study, but was demonstrated to represent a cyst on a breast ultrasound in November 2015      Medication understanding addressed.      I have reviewed the patient's medical, surgical, family and social history in detail today, 02/23/2016  and updated  the computerized patient record as appropriate.  has a current medication list which includes the following prescription(s): aspirin, azelaic acid, bupropion, clobetasol, gabapentin, halobetasol propionate, metronidazole, mineral oil-hydrophil petrolat, nitroglycerin, nystatin, pantoprazole, ranitidine, riboflavin (vitamin b2), selenium sulfide, sulfacetamide sodium-sulfur, and triamcinolone acetonide.   ROS: as above  OBJECTIVE:   Vitals: BP 131/70  Pulse 65  Temp 36.7 C (98.1 F)  Ht 1.651 m (5\' 5" )  Wt 100.4 kg (221 lb 5.5 oz)  SpO2 98%  BMI 36.83 kg/m2   Appearance:anxious  Exam: Lungs: clear to auscultation bilaterally, normal percussion bilaterally  Heart: regular rate and rhythm, S1, S2 normal, no murmur, click, rub or gallop  No adenopathy     [name], MD  [place]  [facility]  [place] [address]  Dept: [phone]  Dept Fax: [phone]

## 2016-02-24 LAB — ADULT ROUTINE BLOOD CULTURE, SET OF 2 BOTTLES (BACTERIA AND YEAST): BLOOD CULTURE, ROUTINE: NO GROWTH

## 2016-04-09 ENCOUNTER — Ambulatory Visit (INDEPENDENT_AMBULATORY_CARE_PROVIDER_SITE_OTHER): Payer: MEDICAID | Admitting: Psychiatry

## 2016-04-09 VITALS — BP 128/82 | HR 110 | Wt 229.1 lb

## 2016-04-09 DIAGNOSIS — Z9114 Patient's other noncompliance with medication regimen: Secondary | ICD-10-CM

## 2016-04-09 DIAGNOSIS — F331 Major depressive disorder, recurrent, moderate: Secondary | ICD-10-CM

## 2016-04-09 MED ORDER — FLUOXETINE 20 MG TABLET
20.0000 mg | ORAL_TABLET | Freq: Every day | ORAL | 5 refills | Status: DC
Start: 2016-04-09 — End: 2016-07-06

## 2016-04-09 NOTE — Progress Notes (Signed)
[place]  Department of Behavioral Medicine and Psychiatry    Outpatient Progress Note      [name]   [id number]  January 04, 1956    DOS: 04/09/2016      Chief Complaint   Patient presents with    Depression        Subjective:  [name] is a 60 y.o. female presenting for a follow-up visit alone. Patient was last seen on 10/21/2015 by Dr. Salena Saner.     Patient reports feeling overwhelmed recently.  She states that she stopped taking medications for about 6 months as she reports that her Wellbutrin was discontinued last month and her insurance stopped paying for Prozac tablets. States that she was unable to refill her Prozac because an appeals letter needs to be written on her behalf as she requires a tablets rather than the capsules due to a blue dye allergy.     Depression has been debilitating and patient has been having difficulty with ADLs. She has been crying daily, having difficulty concentrating, poor sleep, anhedonia, racing thoughts at night, and passive SI. Denies plans or intentions and says "the Reita Cliche always pulls me in." Denies access to weapons.    Sleep has been poor because her mind won't shut off. Endorses delayed sleep onset as well as disturbances with sleep maintenance. Appetite is fair.       Reported side effects: no insomnia, no changes in appetite or weight, no galactorrhea or EPS, no tremor, no rash     Review of systems: no chest pain/palpitations, no shortness of breath, no n/v/d, no insomnia, no     Medical Hx: no changes since last  visit   Social Hx: no changes since last visit  Previous medication trials:    - fluoxetine 60 mg: worked well for ~6 yrs and then again 2 years ago   - duloxetine: could not take due to blue dye allergy in capsule   - venlafaxine:  could not take due to blue dye allergy in capsule   - sertraline: stopped working    - paroxetine: ineffective   - escitalopram:  Ineffective/allergic reaction   - bupropion: effective  with fluoxetine, ineffective as monotherapy      Objective:  Vitals:    04/09/16 1042   BP: 128/82   Pulse: (!) 110   Weight: 103.9 kg (229 lb 0.9 oz)     Body mass index is 38.12 kg/(m^2).      Physical Exam:   General: no acute distress   Neuro: normal gait, no cogwheeling rigidity, no tremors, no abnormal or involuntary movements   Skin: no rashes or lesions noted on exposed skin       Mental Status Exam:  This is an alert and oriented 60 y.o. Caucasian female who appears her stated age. She is neatly dressed, makes good eye contact and speaks in a clear, articulate voice that is regular in rate and volume.  Mild psychomotor retardation present. Mood is depressed and affect is consistent and within normal range. Thought process is goal-directed with appropriate thought content. She endorses passive SI without plans or intentions. She denies HI/AVH.  Judgement and insight are fair.         Medications:  Current Outpatient Prescriptions   Medication Sig    aspirin 81 mg Oral Tablet, Chewable Take 1 Tab (81 mg total) by mouth Once a day    Azelaic Acid 20 % Cream Apply topically Twice daily  benzonatate (TESSALON) 100 mg Oral Capsule One or two caps three times a day as needed for cough    Clobetasol (TEMOVATE) 0.05 % Ointment Apply topically Twice daily To rash on hands and feet    FLUoxetine (PROZAC) 20 mg Oral Tablet Take 1 Tab (20 mg total) by mouth Once a day    gabapentin (NEURONTIN) 300 mg Oral Capsule Take 2 Caps (600 mg total) by mouth Three times a day (Patient taking differently: Take 300 mg by mouth Three times a day )    Halobetasol Propionate (ULTRAVATE) 0.05 % Cream Apply  topically. Three times a week    Metronidazole (METROCREAM) 0.75 % Cream Apply topically Twice daily    mineral oil-hydrophil petrolat (AQUAPHOR) Ointment by Apply Topically route Four times a day as needed    nitroGLYCERIN (NITROSTAT) 0.4 mg Sublingual Tablet, Sublingual 1 Tab (0.4 mg total) by Sublingual route Every 5  minutes as needed for Chest pain for up to 3 doses for 3 doses over 15 minutes    nystatin (NYSTOP) 100,000 unit/gram Powder Apply once or twice daily to PREVENT rash under breasts    pantoprazole (PROTONIX) 40 mg Oral Tablet, Delayed Release (E.C.) Take 1 Tab (40 mg total) by mouth Every morning before breakfast    raNITIdine (ZANTAC) 15 mg/mL Oral Syrup Take 10 mL (150 mg total) by mouth Every night    Sulfacetamide Sodium-Sulfur 10-5 % (w/w) Cleanser 1 Squirt by Apply externally route Once a day    triamcinolone acetonide (ARISTOCORT A) 0.1 % Ointment by Apply Topically route Twice daily         Assessment:  This is a 60 y.o. female with MDD who presents today for a follow-up visit after not being on medications for several months. Patient is having significant depressive symptoms and because she has failed multiple medication trials, she was offered the option of intensive outpatient treatment [facility]), which she declines at this time. Prescription written today for fluoxetine tablets. If insurance unable to approve, may need to consider partial hospitalization for stabilization and medical management of patient's symptoms. Advised patient that Wellbutrin and Prozac seemed to work well for her in the past, therefore the goal would be to get her back on this combination of medications in the long term.     Diagnosis:   ENCOUNTER DIAGNOSES     ICD-10-CM   1. Moderate episode of recurrent major depressive disorder (HCC) F33.1          Plan:  1. MDD   - start fluoxetine 20 mg po daily. Patient educated on risks, benefits, and side effects of SSRIs and this medication in particular.   - patient has allergy to blue dye that is in the capsule formulation of this medication, therefore tablets have been called in, but may need an appeals letter      Follow up: Return to clinic in 1 month with Dr. Koleen Nimrod. Patient advised to call in the interim with questions/concerns and to go to the ER if safety concerns  arise.       Late entry for encounter on 04/09/2016  Leighton Ruff, MD   PGY-3  [facility] Medicine  Department of Behavioral Medicine and Psychiatry  04/09/2016    Late entry for 04/09/2016   I saw and examined the patient.  I reviewed the resident's note.  I agree with the findings and plan of care as documented in the resident's note.  Any exceptions/additions are edited/noted.    Racheal Patches, MD  Child and Adolescent Psychiatrist  Assistant Professor  [facility]  [address]  [place] [address]

## 2016-04-14 ENCOUNTER — Ambulatory Visit (HOSPITAL_COMMUNITY): Payer: Self-pay | Admitting: Psychiatry

## 2016-04-14 ENCOUNTER — Ambulatory Visit (HOSPITAL_COMMUNITY): Payer: Self-pay

## 2016-04-14 NOTE — Telephone Encounter (Signed)
Prior Authorization    Prior Auth Approved 04/14/2016    Medication/Strength: fluoxetine HCL 20 mg Oral tablet with the direction 1 tablet by mouth daily     Insurance: [place] through: 04/14/2017    Trixie Rude, CASE MANAGER  04/14/2016, 15:12

## 2016-04-14 NOTE — Telephone Encounter (Signed)
Prior Authorization    New Prior Auth Sent 04/14/2016    Medication/Strength: Fluoxetine HCL 20 mg tablet with the directions to take 1 tab by mouth once a day     Insurance:  [place] [employer]     Prior Authorizations can take up to 72 hours for approval.    Trixie Rude, CASE MANAGER  04/14/2016, 14:00

## 2016-04-15 ENCOUNTER — Ambulatory Visit
Admission: RE | Admit: 2016-04-15 | Discharge: 2016-04-15 | Disposition: A | Discharge status: Home / Self Care | Payer: MEDICAID | Source: Ambulatory Visit | Attending: Family Medicine | Admitting: Family Medicine

## 2016-04-15 ENCOUNTER — Encounter (HOSPITAL_COMMUNITY): Payer: MEDICAID | Admitting: Student in an Organized Health Care Education/Training Program

## 2016-04-15 ENCOUNTER — Encounter (HOSPITAL_BASED_OUTPATIENT_CLINIC_OR_DEPARTMENT_OTHER): Payer: Self-pay | Admitting: Family Medicine

## 2016-04-15 ENCOUNTER — Ambulatory Visit (HOSPITAL_BASED_OUTPATIENT_CLINIC_OR_DEPARTMENT_OTHER): Payer: MEDICAID | Admitting: Family Medicine

## 2016-04-15 VITALS — BP 135/84 | HR 91 | Temp 97.2°F | Ht 65.0 in | Wt 229.5 lb

## 2016-04-15 DIAGNOSIS — Z79899 Other long term (current) drug therapy: Secondary | ICD-10-CM | POA: Insufficient documentation

## 2016-04-15 DIAGNOSIS — S63041A Subluxation of carpometacarpal joint of right thumb, initial encounter: Secondary | ICD-10-CM

## 2016-04-15 DIAGNOSIS — M542 Cervicalgia: Secondary | ICD-10-CM | POA: Insufficient documentation

## 2016-04-15 DIAGNOSIS — M25512 Pain in left shoulder: Secondary | ICD-10-CM | POA: Insufficient documentation

## 2016-04-15 DIAGNOSIS — M79641 Pain in right hand: Secondary | ICD-10-CM

## 2016-04-15 DIAGNOSIS — Z7982 Long term (current) use of aspirin: Secondary | ICD-10-CM | POA: Insufficient documentation

## 2016-04-15 DIAGNOSIS — M25531 Pain in right wrist: Secondary | ICD-10-CM | POA: Insufficient documentation

## 2016-04-15 DIAGNOSIS — H538 Other visual disturbances: Secondary | ICD-10-CM | POA: Insufficient documentation

## 2016-04-15 DIAGNOSIS — W19XXXD Unspecified fall, subsequent encounter: Secondary | ICD-10-CM | POA: Insufficient documentation

## 2016-04-15 MED ORDER — CYCLOBENZAPRINE 5 MG TABLET
5.00 mg | ORAL_TABLET | Freq: Three times a day (TID) | ORAL | 3 refills | Status: DC | PRN
Start: 2016-04-15 — End: 2016-06-21

## 2016-04-15 NOTE — Progress Notes (Signed)
Subjective:     Patient ID:  [name] is an 60 y.o. female   Chief Complaint:    Chief Complaint   Patient presents with    Fall     follow up, left eye goes blurry       HPI Comments: [name] is a 60 yo female presenting for a fall 1 week ago. [facility], and she was placed in a right arm splint. She has had it on for a week. She has been icing her wrist. She has been taking Advil. She had an X-ray that was negative. Furthermore, she is complaining of left shoulder pain radiating up her neck to the side of her head. Her vision will go blurry whenever she is in pain. She has been taking Advil; however, she does not feel like this is helping. She is unsure of how she fell. She does not think she hit her head or lost consciousness.     Current Outpatient Prescriptions   Medication Sig    aspirin 81 mg Oral Tablet, Chewable Take 1 Tab (81 mg total) by mouth Once a day    Azelaic Acid 20 % Cream Apply topically Twice daily    benzonatate (TESSALON) 100 mg Oral Capsule One or two caps three times a day as needed for cough    Clobetasol (TEMOVATE) 0.05 % Ointment Apply topically Twice daily To rash on hands and feet    FLUoxetine (PROZAC) 20 mg Oral Tablet Take 1 Tab (20 mg total) by mouth Once a day    gabapentin (NEURONTIN) 300 mg Oral Capsule Take 2 Caps (600 mg total) by mouth Three times a day (Patient taking differently: Take 300 mg by mouth Three times a day )    Halobetasol Propionate (ULTRAVATE) 0.05 % Cream Apply  topically. Three times a week    Metronidazole (METROCREAM) 0.75 % Cream Apply topically Twice daily    mineral oil-hydrophil petrolat (AQUAPHOR) Ointment by Apply Topically route Four times a day as needed    nitroGLYCERIN (NITROSTAT) 0.4 mg Sublingual Tablet, Sublingual 1 Tab (0.4 mg total) by Sublingual route Every 5 minutes as needed for Chest pain for up to 3 doses for 3 doses over 15 minutes    nystatin (NYSTOP) 100,000 unit/gram Powder Apply once or  twice daily to PREVENT rash under breasts    pantoprazole (PROTONIX) 40 mg Oral Tablet, Delayed Release (E.C.) Take 1 Tab (40 mg total) by mouth Every morning before breakfast    raNITIdine (ZANTAC) 15 mg/mL Oral Syrup Take 10 mL (150 mg total) by mouth Every night    Sulfacetamide Sodium-Sulfur 10-5 % (w/w) Cleanser 1 Squirt by Apply externally route Once a day    triamcinolone acetonide (ARISTOCORT A) 0.1 % Ointment by Apply Topically route Twice daily         Review of Systems   Eyes: Positive for visual disturbance.   Musculoskeletal: Positive for arthralgias and neck pain.   Neurological: Positive for headaches.     Objective:   Physical Exam   Constitutional: She appears well-developed and well-nourished. No distress.   BP 135/84   Pulse 91   Temp 36.2 C (97.2 F) (Thermal Scan)    Ht 1.651 m (5\' 5" )   Wt 104.1 kg (229 lb 8 oz)   BMI 38.19 kg/m2     Musculoskeletal:   Right hand TTP in thenar eminence, unable to make fist    Left shoulder TTP with paraspinal tightness noted  in left neck, face TTP    Neurological: She is alert. No cranial nerve deficit.   Psychiatric:   Alternating between tearful and anxious      Ortho Exam    Assessment & Plan:   [name] is a 60 yo female presenting for follow-up after a fall.     (M79.641) Right hand pain  (primary encounter diagnosis)  Plan: XR WRIST RIGHT, Refer to Phys Therapy -         External         Given possibility of scaphoid fracture being missed on initial X-ray, will get repeat scan. She is going to wean herself out of the brace if tolerated.     (M54.2) Neck pain & (M25.512) Left shoulder pain  Plan: Refer to Phys Therapy - External,         cyclobenzaprine (FLEXERIL) 5 mg Oral Tablet         Flexeril may help with the muscle tightness   Discussed that she will likely still have some residual pain after the fall that will slowly improve. Per her description, the pain appears to be originating in the shoulder and radiating to her head via the neck.     She will continue to take Advil.     Park Breed, MD 04/15/2016, 16:23        I discussed the patient's care with the Resident prior to the patient leaving the clinic. Any significant discussion points are noted .    Konrad Saha, MD

## 2016-04-23 ENCOUNTER — Ambulatory Visit: Payer: MEDICAID | Attending: Family Medicine | Admitting: Physician Assistant

## 2016-04-23 ENCOUNTER — Encounter (HOSPITAL_BASED_OUTPATIENT_CLINIC_OR_DEPARTMENT_OTHER): Payer: Self-pay | Admitting: Physician Assistant

## 2016-04-23 VITALS — BP 126/80 | HR 120 | Temp 97.5°F | Ht 65.0 in | Wt 228.4 lb

## 2016-04-23 DIAGNOSIS — R109 Unspecified abdominal pain: Principal | ICD-10-CM | POA: Insufficient documentation

## 2016-04-23 DIAGNOSIS — Z9049 Acquired absence of other specified parts of digestive tract: Secondary | ICD-10-CM | POA: Insufficient documentation

## 2016-04-23 DIAGNOSIS — R399 Unspecified symptoms and signs involving the genitourinary system: Secondary | ICD-10-CM

## 2016-04-23 DIAGNOSIS — N904 Leukoplakia of vulva: Secondary | ICD-10-CM

## 2016-04-23 DIAGNOSIS — Z90710 Acquired absence of both cervix and uterus: Secondary | ICD-10-CM | POA: Insufficient documentation

## 2016-04-23 MED ORDER — HALOBETASOL PROPIONATE 0.05 % TOPICAL CREAM
TOPICAL_CREAM | CUTANEOUS | 2 refills | Status: DC
Start: 2016-04-23 — End: 2019-10-29

## 2016-04-23 MED ORDER — CIPROFLOXACIN 500 MG TABLET
500.00 mg | ORAL_TABLET | Freq: Two times a day (BID) | ORAL | 0 refills | Status: AC
Start: 2016-04-23 — End: 2016-05-03

## 2016-04-23 NOTE — Progress Notes (Signed)
Subjective:     Patient ID:  [name] is an 60 y.o. female   Chief Complaint:    Chief Complaint   Patient presents with    Abdominal Pain    Diarrhea     lasted three days       HPI     The patient is complaining of lower abdominal pain, watery diarrhea, and burping. She denies fever, nausea, vomiting, or blood in the stool. She has mild burning when she urinates and increased urgency with only a small amount of urine production. The diarrhea stopped yesterday. She states for the few days she had diarrhea she did not urinate much at all. Her symptoms began 3 days ago. She has tried a bland diet, Pepto Bismol, and Imodium. The Imodium stopped the diarrhea. She does have a history of diverticulitis. She had a cholecystectomy, appendectomy, and hysterectomy. She has one ovary left.     Review of Systems   Constitutional: Negative.    HENT: Negative.    Eyes: Negative.    Respiratory: Negative.    Cardiovascular: Negative.    Gastrointestinal: Positive for abdominal pain and diarrhea.   Endocrine: Negative.    Genitourinary: Positive for decreased urine volume, dysuria and urgency.   Musculoskeletal: Negative.    Skin: Negative.    Neurological: Negative.    Psychiatric/Behavioral: Negative.      Objective:   Physical Exam   Constitutional: She is oriented to person, place, and time. She appears well-developed and well-nourished.   HENT:   Head: Normocephalic and atraumatic.   Eyes: Conjunctivae are normal. Pupils are equal, round, and reactive to light.   Neck: Normal range of motion. Neck supple.   Cardiovascular: Normal rate, regular rhythm and normal heart sounds.    Pulmonary/Chest: Effort normal and breath sounds normal.   Abdominal: Soft. Bowel sounds are normal. There is no hepatosplenomegaly. There is tenderness in the suprapubic area. There is no CVA tenderness.   Neurological: She is alert and oriented to person, place, and time.   Skin: Skin is warm and dry.   Psychiatric: She has a normal  mood and affect. Her behavior is normal.   Vitals reviewed.    Vitals:    04/23/16 1104   BP: 126/80   Pulse: (!) 120   Temp: 36.4 C (97.5 F)   TempSrc: Thermal Scan   SpO2: 94%   Weight: 103.6 kg (228 lb 6.3 oz)   Height: 1.651 m (5\' 5" )       Ortho Exam    Assessment & Plan:     1. (R39.9) UTI symptoms  (primary encounter diagnosis)  Plan: A urine dip performed in the clinic today revealed moderate blood and trace leukocytes.  A urine culture was sent.  Will call with results.  To begin Cipro 500 mg PO BID for 5 days.  To drink lots of fluids.  To call office if symptoms persist or worsen.    2. (R10.9) Abdominal pain  Plan: Likely related to UTI as noted above.  To begin Cipro 500 mg PO BID for 5 days (given 1 refill as patient is leaving to go out of town this weekend for 10 days).  If urine culture is negative, possibly diverticulitis at which point will add Flagyl.  To drink lots of fluids.  To call office if symptoms persist or worsen.    Patient seen independently with cosigning physician present in clinic.    Lorn Junes, PA-C    Literberry  McCready, PA-C  04/23/2016, 12:15

## 2016-04-23 NOTE — Nursing Note (Signed)
04/23/16 1145   Required: Location Test Performed At:   [facility] [place], [place] [address]   Urine test  (Siemens Multistix 10 SG)   Time collected 1145   Color Dark   Clarity Cloudy   Glucose Negative   Bilirubin Negative   Ketones Negative   Urine Specific Gravity 1.020   Blood (urine) Moderate Non Hemolyzed   pH 5.0   Protein Negative   Urobilinogen Normal    Nitrite Negative   Leukocytes Trace   Bottle Number   (Siemens Multistix 10 SG) [id number]   Lot # [id number]   Expiration Date 02/21/17   Initials kk

## 2016-04-24 LAB — URINE CULTURE: URINE CULTURE: 30000

## 2016-04-28 ENCOUNTER — Encounter (HOSPITAL_BASED_OUTPATIENT_CLINIC_OR_DEPARTMENT_OTHER): Payer: Self-pay | Admitting: Physician Assistant

## 2016-04-30 ENCOUNTER — Encounter (HOSPITAL_COMMUNITY): Payer: MEDICAID | Admitting: Psychiatry

## 2016-06-21 ENCOUNTER — Encounter (HOSPITAL_BASED_OUTPATIENT_CLINIC_OR_DEPARTMENT_OTHER): Payer: Self-pay | Admitting: Family Medicine

## 2016-06-21 ENCOUNTER — Ambulatory Visit: Payer: MEDICAID | Attending: Family Medicine | Admitting: Family Medicine

## 2016-06-21 VITALS — BP 139/81 | HR 84 | Temp 97.5°F | Ht 65.0 in | Wt 227.1 lb

## 2016-06-21 DIAGNOSIS — M5416 Radiculopathy, lumbar region: Secondary | ICD-10-CM | POA: Insufficient documentation

## 2016-06-21 DIAGNOSIS — M79605 Pain in left leg: Secondary | ICD-10-CM | POA: Insufficient documentation

## 2016-06-21 DIAGNOSIS — D110 Benign neoplasm of parotid gland: Secondary | ICD-10-CM | POA: Insufficient documentation

## 2016-06-21 DIAGNOSIS — R002 Palpitations: Secondary | ICD-10-CM | POA: Insufficient documentation

## 2016-06-21 DIAGNOSIS — M545 Low back pain, unspecified: Secondary | ICD-10-CM

## 2016-06-21 MED ORDER — GABAPENTIN 300 MG CAPSULE
600.0000 mg | ORAL_CAPSULE | Freq: Three times a day (TID) | ORAL | 3 refills | Status: DC
Start: 2016-06-21 — End: 2018-01-31

## 2016-06-21 MED ORDER — NYSTATIN 100,000 UNIT/GRAM TOPICAL POWDER
CUTANEOUS | 11 refills | Status: DC
Start: 2016-06-21 — End: 2022-05-27

## 2016-06-21 NOTE — Progress Notes (Signed)
ASSESSMENT:   PLAN:    ICD-10-CM    1. Low back pain M54.5 AMB CONSULT/REFERRAL PHYS THERAPY- EXTERNAL   2. Left leg pain M79.605    3. Left lumbar radiculopathy M54.16 gabapentin (NEURONTIN) 300 mg Oral Capsule     AMB CONSULT/REFERRAL PHYS THERAPY- EXTERNAL     4. 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 Palpitations R00.2 Reassured for now  If keeps recurring will do further workup       Return in about 2 months (around 08/21/2016) for low back pain.              Advised to call back directly if there are further questions, or if these symptoms fail to improve as anticipated or worsen.    SUBJECTIVE:  [name] is a 60 y.o. female is here today for Multiple medical problems follow up      Right wrist injury in June  Had been feeling better    Palpitations awakened her from sleep  No sob or chest pain  Lasted about 1 minutes    Low back pain painful for a few months intermittently  Bilateral knee pain  Not painful to walk or sit    Had a right parotid mass resected in April 2016--found to be pleomorphic adenoma  Isn't sure if she needs follow up  No recurrent pain or mass    Medication understanding addressed.      I have reviewed the patient's medical, surgical, family and social history in detail today, 06/21/2016  and updated the computerized patient record as appropriate.  has a current medication list which includes the following prescription(s): aspirin, azelaic acid, benzonatate, clobetasol, cyclobenzaprine, fluoxetine, gabapentin, halobetasol propionate, metronidazole, mineral oil-hydrophil petrolat, nitroglycerin, nystatin, pantoprazole, ranitidine, sulfacetamide sodium-sulfur, and triamcinolone acetonide.   ROS: as above  OBJECTIVE:   Vitals: BP 139/81  Pulse 84  Temp 36.4 C (97.5 F)  Ht 1.651 m (5\' 5" )  Wt 103 kg (227 lb 1.2 oz)  SpO2 95%  BMI 37.79 kg/m2   Appearance:in no apparent distress  Exam: Lungs: clear to  auscultation bilaterally, normal percussion bilaterally  Heart: regular rate and rhythm, S1, S2 normal, no murmur, click, rub or gallop  Right wrist ROM is good, no swelling warmth or deformity  Mild sacral tenderness diffusely in back  Negative straight leg raise  DTR's intact  Right parotid area without mass or nodes     [name], MD  [facility]  [place] [address]  Dept: [phone]  Dept Fax: [phone]

## 2016-06-29 ENCOUNTER — Encounter (HOSPITAL_COMMUNITY): Payer: MEDICAID | Admitting: Psychiatry

## 2016-07-01 ENCOUNTER — Encounter (HOSPITAL_BASED_OUTPATIENT_CLINIC_OR_DEPARTMENT_OTHER): Payer: Self-pay | Admitting: GENERAL

## 2016-07-01 ENCOUNTER — Encounter (HOSPITAL_BASED_OUTPATIENT_CLINIC_OR_DEPARTMENT_OTHER): Payer: Self-pay | Admitting: Family Medicine

## 2016-07-01 ENCOUNTER — Ambulatory Visit (HOSPITAL_BASED_OUTPATIENT_CLINIC_OR_DEPARTMENT_OTHER): Payer: MEDICAID

## 2016-07-01 ENCOUNTER — Ambulatory Visit: Payer: MEDICAID | Attending: Family Medicine | Admitting: GENERAL

## 2016-07-01 VITALS — BP 158/70 | HR 72 | Temp 97.5°F | Ht 65.0 in | Wt 226.2 lb

## 2016-07-01 DIAGNOSIS — Z1159 Encounter for screening for other viral diseases: Secondary | ICD-10-CM

## 2016-07-01 DIAGNOSIS — I10 Essential (primary) hypertension: Secondary | ICD-10-CM | POA: Insufficient documentation

## 2016-07-01 DIAGNOSIS — Z7982 Long term (current) use of aspirin: Secondary | ICD-10-CM | POA: Insufficient documentation

## 2016-07-01 LAB — BASIC METABOLIC PANEL
ANION GAP: 10 mmol/L (ref 4–13)
BUN/CREA RATIO: 15 (ref 6–22)
BUN: 14 mg/dL (ref 8–25)
CALCIUM: 9.5 mg/dL (ref 8.5–10.2)
CHLORIDE: 105 mmol/L (ref 96–111)
CO2 TOTAL: 23 mmol/L (ref 22–32)
CREATININE: 0.95 mg/dL (ref 0.49–1.10)
ESTIMATED GFR: >59 mL/min/1.73mˆ2 (ref >59)
GLUCOSE: 87 mg/dL (ref 65–139)
POTASSIUM: 4.1 mmol/L (ref 3.5–5.1)
SODIUM: 138 mmol/L (ref 136–145)

## 2016-07-01 LAB — HEPATITIS C ANTIBODY SCREEN WITH REFLEX TO HCV PCR: HCV ANTIBODY QUALITATIVE: NEGATIVE

## 2016-07-01 MED ORDER — HYDROCHLOROTHIAZIDE 12.5 MG TABLET
12.5000 mg | ORAL_TABLET | Freq: Every day | ORAL | 1 refills | Status: DC
Start: 2016-07-01 — End: 2018-03-09

## 2016-07-01 NOTE — Progress Notes (Signed)
[facility]   Department of Family Medicine   Clinic Progress Note    [name]  MRN: [id number]  DOB: 13-Jul-1956  Date of Service: 07/01/2016    CHIEF COMPLAINT  Chief Complaint   Patient presents with    Blood Work Request     hep c blood test       SUBJECTIVE  Barbara Eckersley is a 60 y.o. female who presents to clinic for the following:    She would like to have HCV testing performed as one time screening due to her birth year. She denies any recent needle sticks or other blood exposures.    She denies any prior history of hypertension, but does have numerous family members with it. No recent headaches, vision changes, chest pain or shortness of breath. She did have a one time episode of palpitations that awoke her from sleep 2 weeks ago and lasted a few minutes. She has not had any recurrence of this since.        Review of Systems:  Constitutional: negative for fevers, chills  Eyes: negative for visual disturbance  Ears, nose, mouth, throat, and face: negative for earaches, nasal congestion and sore throat  Respiratory: negative for cough or dyspnea  Cardiovascular: negative for chest pain and palpitations  Gastrointestinal: negative for reflux symptoms, nausea, vomiting, change in bowel habits  Genitourinary: negative for dysuria and hematuria  Integument: negative for rash  Musculoskeletal: negative for myalgias and arthralgias  Neurological: negative for headaches, paresthesia and weakness      Medications:     Outpatient Medications Prior to Visit:  aspirin 81 mg Oral Tablet, Chewable Take 1 Tab (81 mg total) by mouth Once a day   Azelaic Acid 20 % Cream Apply topically Twice daily   benzonatate (TESSALON) 100 mg Oral Capsule One or two caps three times a day as needed for cough   Clobetasol (TEMOVATE) 0.05 % Ointment Apply topically Twice daily To rash on hands and feet   FLUoxetine (PROZAC) 20 mg Oral Tablet Take 1 Tab (20 mg total) by mouth Once a day   gabapentin (NEURONTIN) 300 mg  Oral Capsule Take 2 Caps (600 mg total) by mouth Three times a day   Halobetasol Propionate (ULTRAVATE) 0.05 % Cream Apply  topically. Three times a week   Metronidazole (METROCREAM) 0.75 % Cream Apply topically Twice daily   mineral oil-hydrophil petrolat (AQUAPHOR) Ointment by Apply Topically route Four times a day as needed   nitroGLYCERIN (NITROSTAT) 0.4 mg Sublingual Tablet, Sublingual 1 Tab (0.4 mg total) by Sublingual route Every 5 minutes as needed for Chest pain for up to 3 doses for 3 doses over 15 minutes   nystatin (NYSTOP) 100,000 unit/gram Powder Apply once or twice daily to PREVENT rash under breasts   pantoprazole (PROTONIX) 40 mg Oral Tablet, Delayed Release (E.C.) Take 1 Tab (40 mg total) by mouth Every morning before breakfast   raNITIdine (ZANTAC) 15 mg/mL Oral Syrup Take 10 mL (150 mg total) by mouth Every night   Sulfacetamide Sodium-Sulfur 10-5 % (w/w) Cleanser 1 Squirt by Apply externally route Once a day   triamcinolone acetonide (ARISTOCORT A) 0.1 % Ointment by Apply Topically route Twice daily     No facility-administered medications prior to visit.     Allergies:   Allergies   Allergen Reactions    Cymbalta [Duloxetine]      Rash on feet and hands    Blue Dye      Told  to avoid due to patch test result by [facility]    Lipitor [Atorvastatin]      Muscle pain    Nickel     Codeine Itching     RASH    Mobic [Meloxicam] Nausea/ Vomiting    Naprosyn [Naproxen] Nausea/ Vomiting         OBJECTIVE  BP (!) 155/76   Pulse 72   Temp 36.4 C (97.5 F) (Thermal Scan)    Ht 1.651 m (5\' 5" )   Wt 102.6 kg (226 lb 3.1 oz)   SpO2 99%   BMI 37.64 kg/m2   Repeat manual BP 158/70  General: no distress  HENT: NCAT  Lungs: normal respiratory effort  Cardiovascular: radial pulses equal  Extremities: no cyanosis or edema  Skin: warm and dry, no rash  Neurologic: awake and alert, gait is normal, CN 2-12 grossly intact  Psychiatric: normal affect and behavior    ASSESSMENT/PLAN  (I10) Hypertension  (primary  encounter diagnosis)  Plan: BMP ( non-fasting), Micralb/ Cr Ratio, urine        Baseline labs as above. Begin HCTZ 12.g mg daily. Re-check in 4 weeks    (Z11.59) Need for hepatitis C screening test  Plan: HEPATITIS C ANTIBODY SCREEN WITH REFLEX TO HCV         PCR            Orders Placed This Encounter    HEPATITIS C ANTIBODY SCREEN WITH REFLEX TO HCV PCR    BMP ( non-fasting)    Micralb/ Cr Ratio, urine    hydroCHLOROthiazide (HYDRODIURIL) 12.5 mg Oral Tablet         Return in about 4 weeks (around 07/29/2016) for repeat BP check.        Rockwell Germany, MD 07/01/2016, 14:26      ATTENDING NOTE  I discussed the patient's care with the Resident prior to the patient leaving the clinic. Any significant discussion points are noted .    Ky Barban, MD

## 2016-07-06 ENCOUNTER — Ambulatory Visit
Admission: RE | Admit: 2016-07-06 | Discharge: 2016-07-06 | Disposition: A | Discharge status: Home / Self Care | Payer: MEDICAID | Source: Ambulatory Visit | Attending: Ophthalmology | Admitting: Ophthalmology

## 2016-07-06 ENCOUNTER — Ambulatory Visit (INDEPENDENT_AMBULATORY_CARE_PROVIDER_SITE_OTHER): Payer: Self-pay | Admitting: Ophthalmology

## 2016-07-06 ENCOUNTER — Ambulatory Visit (INDEPENDENT_AMBULATORY_CARE_PROVIDER_SITE_OTHER): Payer: MEDICAID | Admitting: Psychiatry

## 2016-07-06 ENCOUNTER — Ambulatory Visit (HOSPITAL_BASED_OUTPATIENT_CLINIC_OR_DEPARTMENT_OTHER): Payer: MEDICAID | Admitting: Ophthalmology

## 2016-07-06 ENCOUNTER — Encounter (INDEPENDENT_AMBULATORY_CARE_PROVIDER_SITE_OTHER): Payer: Self-pay | Admitting: Ophthalmology

## 2016-07-06 DIAGNOSIS — H5711 Ocular pain, right eye: Secondary | ICD-10-CM | POA: Insufficient documentation

## 2016-07-06 DIAGNOSIS — F331 Major depressive disorder, recurrent, moderate: Secondary | ICD-10-CM

## 2016-07-06 DIAGNOSIS — H00012 Hordeolum externum right lower eyelid: Secondary | ICD-10-CM

## 2016-07-06 DIAGNOSIS — H5710 Ocular pain, unspecified eye: Secondary | ICD-10-CM

## 2016-07-06 DIAGNOSIS — Z87891 Personal history of nicotine dependence: Secondary | ICD-10-CM | POA: Insufficient documentation

## 2016-07-06 MED ORDER — FLUOXETINE 20 MG TABLET
20.0000 mg | ORAL_TABLET | Freq: Every day | ORAL | 1 refills | Status: DC
Start: 2016-07-06 — End: 2016-10-23

## 2016-07-06 NOTE — Progress Notes (Signed)
[facility]  Department of Behavioral Medicine and Psychiatry    Outpatient Progress Note      [name]   [id number]  07-22-1956    DOS: 07/06/2016    CC: MDD     Subjective:  [name] is a 60 y.o. female presenting for a follow-up visit for MDD. Patient was last seen by Dr. Miguel Dibble.    Since last visit, patient states she has finally been getting Prozac back in her system after being off it for the past 4 months. Pt states the Prozac has been helping with her lack of motivation, and feelings of sadness. Acknowledges she still has crying spells 3-4 times per day, but that this is improved from crying "24-7." Pt has also been able tot alk to people again since last visit.    Pt states sleep has not been doing too great because of some recent pain in her right eye. Despite this, it is still better than at last visit. States racing thoughts have improved since initiated Prozac. Denies any SI/HI, and notes having some passive SI a couple months ago when she was not on her meds, which has since resolved. Pt reports she is currently in physical therapy, and is living in [place], though plans to be moving back with her sisters out of state possibly in November. States her parents moved up to [place] and she followed them, but as they are no longer here, she kind of got stuck in town without much support around.       Allergies: none new    Reported side effects: no insomnia, no changes in appetite or weight, no galactorrhea or EPS, no tremor, no rash     Review of systems: no chest pain/palpitations, no shortness of breath, no n/v/d, no insomnia, no     Medical Hx: no changes since last  visit   Social Hx: no changes since last visit  Previous medication trials:    - fluoxetine 60 mg: worked well for ~6 yrs and then again 2 years ago   - duloxetine: could not take due to blue dye allergy in capsule   - venlafaxine:  could not take due to blue dye allergy in capsule   - sertraline:  stopped working    - paroxetine: ineffective   - escitalopram:  Ineffective/allergic reaction   - bupropion: effective with fluoxetine, ineffective as monotherapy      Objective:  There were no vitals filed for this visit.  There is no height or weight on file to calculate BMI.    Physical Exam:   General: no acute distress   Neuro: normal gait, no cogwheeling rigidity, no tremors, no abnormal or involuntary movements   Skin: no rashes or lesions noted on exposed skin     Mental Status Exam:  This is an alert and oriented 60 y.o. Caucasian female who appears her stated age. She is neatly dressed, makes good eye contact and speaks in a clear, articulate voice that is regular in rate and volume.  Mild psychomotor retardation present. Mood is euthymic and affect is consistent and within normal range. Thought process is goal-directed with appropriate thought content. She denies any SI or HI and denies any AVH.  Judgement and insight are good.     Medications:  Current Outpatient Prescriptions   Medication Sig    aspirin 81 mg Oral Tablet, Chewable Take 1 Tab (81 mg total) by mouth Once a day  Azelaic Acid 20 % Cream Apply topically Twice daily    benzonatate (TESSALON) 100 mg Oral Capsule One or two caps three times a day as needed for cough    Clobetasol (TEMOVATE) 0.05 % Ointment Apply topically Twice daily To rash on hands and feet    FLUoxetine (PROZAC) 20 mg Oral Tablet Take 1 Tab (20 mg total) by mouth Once a day    gabapentin (NEURONTIN) 300 mg Oral Capsule Take 2 Caps (600 mg total) by mouth Three times a day    Halobetasol Propionate (ULTRAVATE) 0.05 % Cream Apply  topically. Three times a week    hydroCHLOROthiazide (HYDRODIURIL) 12.5 mg Oral Tablet Take 1 Tab (12.5 mg total) by mouth Once a day    Metronidazole (METROCREAM) 0.75 % Cream Apply topically Twice daily    mineral oil-hydrophil petrolat (AQUAPHOR) Ointment by Apply Topically route Four times a day as needed    nitroGLYCERIN (NITROSTAT)  0.4 mg Sublingual Tablet, Sublingual 1 Tab (0.4 mg total) by Sublingual route Every 5 minutes as needed for Chest pain for up to 3 doses for 3 doses over 15 minutes    nystatin (NYSTOP) 100,000 unit/gram Powder Apply once or twice daily to PREVENT rash under breasts    pantoprazole (PROTONIX) 40 mg Oral Tablet, Delayed Release (E.C.) Take 1 Tab (40 mg total) by mouth Every morning before breakfast    raNITIdine (ZANTAC) 15 mg/mL Oral Syrup Take 10 mL (150 mg total) by mouth Every night    Sulfacetamide Sodium-Sulfur 10-5 % (w/w) Cleanser 1 Squirt by Apply externally route Once a day    triamcinolone acetonide (ARISTOCORT A) 0.1 % Ointment by Apply Topically route Twice daily     Assessment:  This is a 60 y.o. female with MDD who presents today for a follow-up visit after starting fluoxetine tablets, 20 mg daily as she has an allergy to the dye used in the capsule form.    Diagnosis:   Axis I: MDD  Axis II: Deferred  Axis III: HLD, CAD, Migraine, h/o squamous cell carcinoma  Axis IV: Chronic mental illness, Social Stressors    Plan:  1. Fluoxetine 20 mg daily increased to 30 mg for a week if pills scored, if not start 40 mg daily    Follow up: 6 week follow up    Chales Abrahams, MD   PGY-2  [facility]  Department of Behavioral Medicine and Psychiatry  07/06/2016       Late entry for 07/06/2016. I saw and examined the patient.  I reviewed the resident's note.  I agree with the findings and plan of care as documented in the resident's note.  Any exceptions/additions are edited/noted.    Blair Dolphin, MD

## 2016-07-06 NOTE — Progress Notes (Addendum)
[facility]  [place] [address]  Dept: [phone]    Patient Name: [name]  MRN#: [id number]  Pateros: [birth year]    Date of Service: 07/06/2016    Chief Complaint     Eye Pain          [name] is a 60 y.o. female who presents today for evaluation/consultation of:  HPI     Eye Pain     In right eye.  Characterized as irritation.  Pain was noted as 8/10.  Duration of 2 days.  Since onset it is stable.  Associated symptoms include redness and discharge.  Negative for tearing.           Comments   Pt here for eye pain under right eye occurring for 2 days. Pt states started having swelling to right side around 1 day ago.   Pt states 2-3 days ago thought was getting a stye on RLL, started doing WC and nothing seems to be helping.   Pt states around 2 days ago noticed having "white" drainage from right side. Pt states thinks has a clogged tear duct on right side.   Pt s/p surgery on 01/28/2015 with Dr. Kennith Center  1. Bilateral lower eyelid ectropion repair by lateral tarsal strip   2. Bilateral lower eyelid retraction repair by ear cartilage elevation   3. Harvest of cartilage, right ear       Last edited by Ringer, Stana Bunting, COA on 07/06/2016  2:46 PM. (History)        ROS     Positive for: Eyes (eye pain/swelling)    Negative for: Constitutional, Gastrointestinal, Neurological, Skin, Genitourinary, Musculoskeletal, HENT, Endocrine, Cardiovascular, Respiratory, Psychiatric, Allergic/Imm, Heme/Lymph    Last edited by Ringer, Stana Bunting, COA on 07/06/2016  2:40 PM. (History)           Nichole Lynn Ringer, COA 07/06/2016, 15:02     Past Surgical History:   Procedure Laterality Date    COLONOSCOPY  01/06/09    COLONOSCOPY performed by Loletta Parish F at [place]  03/05/2010    GASTROSCOPY performed by Delrae Alfred, SWATI at [place]  03/05/2010    GASTROSCOPY WITH BIOPSY performed by Jocelyn Lamer at [place]  2007    left distal fibula, casted    HX APPENDECTOMY      HX CHOLECYSTECTOMY      HX COLONOSCOPY      HX CYSTOCELE REPAIR  09/24/09    HX HAND SURGERY  2012    for Carpal tunnel     HX OOPHORECTOMY      left ovary removed    HX TONSILLECTOMY      HX TOTAL VAGINAL HYSTERECTOMY  1979    HX WRIST FRACTURE [place]  2007    [place] injury    PB REVISE ULNAR NERVE AT [place]    PB UPPER GI ENDOSCOPY,BIOPSY  12/19/07    patulous GE junction zone, erythema, nonerosive GERD            has a past medical history of Arthritis; Depression; GERD (gastroesophageal reflux disease); Heart murmur (1979); Hyperlipidemia LDL goal < 100 (08/29/2013); MINOR CAD (coronary artery disease) (11/10/2012); Multinodular goiter; Rheumatic fever (17); Squamous cell carcinoma ([place]) (02/06/2015); and Vaginal prolapse (2012). She also has  no past medical history of Allergic rhinitis; Anemia; Anesthesia complication; Angina at rest [facility]); Angina pectoris ([place]); Aortic aneurysm ([place]); Asthma; Asthma; Back problem; Blood transfusion, without reported diagnosis; Breast CA ([place]); Bruises easily; Cervical cancer ([place]); Chronic bronchitis ([place]); Clotting disorder ([place]); Cognitive dysfunction; Colon cancer ([place]); Congestive heart failure, unspecified; COPD (chronic obstructive pulmonary disease) ([place]); CVA (cerebrovascular accident) ([place]); Deep vein thrombosis (DVT) ([place]); Diabetes; Diabetes mellitus ([place]); Diabetes mellitus type II; Diabetes mellitus, type 1; Diabetes mellitus, type 2 ([place]); Difficult intubation; Difficult intubation; Difficulty waking; Disorder of liver; Endometrial cancer ([place]); H/O hearing loss; H/O urinary tract infection; Hearing loss; Hepatic cirrhosis ([place]); Hepatitis A virus infection; HTN; HTN (hypertension); breast cancer; Hypertension; Kidney disease; Loss of weight; Malignant hyperthermia; Myocardial infarction ([place]); Nausea with vomiting; Neck problem; Other  convulsions; Ovarian cancer ([place]); Panic attack; Personal history of irradiation; Personal history of malignant neoplasm of prostate; PONV (postoperative nausea and vomiting); Pseudocholinesterase deficiency; Renal failure; Seizure ([place]); Seizures; Shortness of breath; Sleep apnea; Spinal headache; STD (sexually transmitted disease); Stroke [facility]); Thyroid disease; Thyroid disorder; Treatment; Type 2 diabetes mellitus; Type 2 diabetes mellitus ([place]); Type I diabetes mellitus ([place]); Upper respiratory infection; Uterine cancer ([place]); Valvular disease; Viral hepatitis B; or Viral hepatitis C.    Patient Active Problem List   Diagnosis    Migraine Headaches    GERD not well controlled    Major depressive disorder, recurrent episode ([place])    Patellofemoral pain syndrome    Multinodular goiter    Microscopic hematuria    Neck pain    Well woman exam    Lichen sclerosus et atrophicus of the vulva    Carpal tunnel syndrome on right    Osteoarthritis, knee    Left knee pain    Chest pain at rest    MINOR CAD (coronary artery disease)    Hyperlipidemia LDL goal < 100    Otitis media of both ears    Mass of parotid gland, right    Squamous cell carcinoma (HCC)    Chest pain    Dysuria    Urinary frequency    Multiple pulmonary nodules determined by computed tomography of lung       Family History:  Family History   Problem Relation Age of Onset    Congestive Heart Failure Father 79     died at 65    Coronary Artery Disease Father     Heart Attack Father     Heart Attack Sister      56    Kidney Disease Sister      ESRD    Stroke Paternal Grandmother     Diabetes Mother     High Cholesterol Mother     Hypertension Mother     Coronary Artery Disease Mother 35     CABG    Thyroid Disease Sister     Hypertension Brother     Hypertension Sister     Hypertension Sister     Bipolar Disorder Daughter     Cancer Paternal Aunt      Breast Ca    Breast Cancer Paternal Aunt     Cancer Paternal Aunt        Breast Ca    Breast Cancer Paternal Aunt     Cancer Paternal Aunt      Breast Ca    Breast Cancer Paternal Aunt     Cancer Paternal Aunt      Breast Ca    Breast Cancer Paternal Aunt  Cancer Paternal Aunt      Breast Ca    Breast Cancer Paternal Aunt     Leukemia Paternal Uncle     Cancer Other 76     Breast Ca/Breast Ca    Breast Cancer Other     Leukemia Paternal Uncle            Social History:     Social History   Substance Use Topics    Smoking status: Former Smoker     Packs/day: 2.00     Years: 40.00     Types: Cigarettes     Quit date: 01/22/2005    Smokeless tobacco: Never Used    Alcohol use No            Assessment:      ICD-10-CM    1. Anterior Hordeolum, Right H57.10 OPH EXTERNAL PHOTOS       MD Addition to HPI: Pt is here today for eye pain and swelling OD. Pt states that she feels like she is getting a stye 2-3 days ago. She has not attempted conservative treatment (warm compresses or lid scrubs) but does state she has some drainage from the area. She has been using preservative AT's.     Ophthalmic Plan of Care:  1.) Anterior Hordeolum    -Epilated lashes at the site of the stye today  -Started pt on trial of antibiotic steroids- Maxitrol ointment  -Continue warm compresses        Follow up:    I have asked [name] to follow up in 1 week as previous with Dr. Kennith Center             I have seen and examined the above patient. I discussed the above diagnoses listed in the assessment and the above ophthalmic plan of care with the patient and patient's family. All questions were answered. I reviewed and, when necessary, made changes to the technician/resident note, documented ophthalmology exam, chief complaint, history of present illness, allergies, review of systems, past medical, past surgical, family and social history.    Orders Placed This Encounter   Procedures    OPH EXTERNAL PHOTOS       No orders of the defined types were placed in this encounter.        I am  scribing for, and in the presence of, Dr. [name] for services provided on 07/06/2016.  Lora Havens, SCRIBE     I saw and examined the patient. I reviewed the resident's note. I agree with the findings and plan of care as documented in the resident's note. Any exceptions/additions are edited/noted.    Blase Mess, MD  07/07/2016, 06:52    I personally performed the services described in this documentation, as scribed  in my presence, and it is both accurate  and complete. I have made changes to the exam and/or note as necessary.    Blase Mess, MD  07/07/2016, 06:52

## 2016-07-06 NOTE — Telephone Encounter (Signed)
Regarding: [name]  ----- Message from Namon Cirri sent at 07/06/2016 12:18 PM EDT -----  Patient is having white drainage coming from her left eye and her face is starting to swell.  She is wanting to talk to someone regarding getting in today. [phone]

## 2016-07-07 ENCOUNTER — Encounter (INDEPENDENT_AMBULATORY_CARE_PROVIDER_SITE_OTHER): Payer: Self-pay | Admitting: Ophthalmology

## 2016-07-07 MED ORDER — NEOMYCIN 3.5 MG/G-POLYMYXIN B 10,000 UNIT/G-DEXAMETH 0.1 % EYE OINT
TOPICAL_OINTMENT | Freq: Four times a day (QID) | OPHTHALMIC | 0 refills | Status: DC
Start: 2016-07-07 — End: 2018-01-31

## 2016-07-08 ENCOUNTER — Encounter (HOSPITAL_BASED_OUTPATIENT_CLINIC_OR_DEPARTMENT_OTHER): Payer: Self-pay | Admitting: Family Medicine

## 2016-07-08 NOTE — Progress Notes (Signed)
Fax sent to [facility] [phone]) with Evaluation Summary/Plan of Care. Confirmation received Sept. 14, 2017 @ 1:32 pm.  Cindee Salt, LPN

## 2016-07-08 NOTE — Progress Notes (Unsigned)
Patient did not read MyChart message.  Please call patient and relay the message by phone.  Karen M Fitzpatrick, MD

## 2016-07-08 NOTE — Telephone Encounter (Signed)
-----   Message from Ilhan Treto. Glatfelter sent at 07/08/2016  3:30 AM EDT -----  Regarding: follow up  I heard back from Dr. Posey Pronto who indicated that you do not need a follow up appointment now that they have removed the right parotid lump.  Pleomorphic adenoma is usually resolved by surgery --you would only need a return appointment if you have more problems but those are not expected.  [name], MD

## 2016-07-09 ENCOUNTER — Telehealth (HOSPITAL_BASED_OUTPATIENT_CLINIC_OR_DEPARTMENT_OTHER): Payer: Self-pay | Admitting: Family Medicine

## 2016-07-09 NOTE — Telephone Encounter (Signed)
Called and spoke with patient. Notified of MyChart message per Dr. Rodney Booze. Patient denies questions at this time.   Wayland Salinas, RN

## 2016-07-09 NOTE — Telephone Encounter (Signed)
Regarding: follow up  ----- Message from [name], MD sent at 07/08/2016  6:16 PM EDT -----       ----- Message sent from [name], MD to [name] at 07/01/2016  4:12 PM -----   I heard back from Dr. Posey Pronto who indicated that you do not need a follow up appointment now that they have removed the right parotid lump.  Pleomorphic adenoma is usually resolved by surgery --you would only need a return appointment if you have more problems but those are not expected.  [name], MD

## 2016-07-12 ENCOUNTER — Encounter (INDEPENDENT_AMBULATORY_CARE_PROVIDER_SITE_OTHER): Payer: MEDICAID | Admitting: OPHTHALMOLOGY

## 2016-07-14 ENCOUNTER — Encounter (INDEPENDENT_AMBULATORY_CARE_PROVIDER_SITE_OTHER): Payer: Self-pay | Admitting: OPHTHALMOLOGY

## 2016-07-14 ENCOUNTER — Ambulatory Visit (HOSPITAL_BASED_OUTPATIENT_CLINIC_OR_DEPARTMENT_OTHER): Payer: MEDICAID | Admitting: OPHTHALMOLOGY

## 2016-07-14 ENCOUNTER — Ambulatory Visit
Admission: RE | Admit: 2016-07-14 | Discharge: 2016-07-14 | Disposition: A | Discharge status: Home / Self Care | Payer: MEDICAID | Source: Ambulatory Visit | Attending: OPHTHALMOLOGY | Admitting: OPHTHALMOLOGY

## 2016-07-14 DIAGNOSIS — Z87891 Personal history of nicotine dependence: Secondary | ICD-10-CM | POA: Insufficient documentation

## 2016-07-14 DIAGNOSIS — Z8349 Family history of other endocrine, nutritional and metabolic diseases: Secondary | ICD-10-CM | POA: Insufficient documentation

## 2016-07-14 DIAGNOSIS — Z87898 Personal history of other specified conditions: Secondary | ICD-10-CM | POA: Insufficient documentation

## 2016-07-14 DIAGNOSIS — H0019 Chalazion unspecified eye, unspecified eyelid: Secondary | ICD-10-CM

## 2016-07-14 DIAGNOSIS — Z841 Family history of disorders of kidney and ureter: Secondary | ICD-10-CM | POA: Insufficient documentation

## 2016-07-14 DIAGNOSIS — Z823 Family history of stroke: Secondary | ICD-10-CM | POA: Insufficient documentation

## 2016-07-14 DIAGNOSIS — Z818 Family history of other mental and behavioral disorders: Secondary | ICD-10-CM | POA: Insufficient documentation

## 2016-07-14 DIAGNOSIS — Z833 Family history of diabetes mellitus: Secondary | ICD-10-CM | POA: Insufficient documentation

## 2016-07-14 DIAGNOSIS — Z803 Family history of malignant neoplasm of breast: Secondary | ICD-10-CM | POA: Insufficient documentation

## 2016-07-14 DIAGNOSIS — Z9889 Other specified postprocedural states: Secondary | ICD-10-CM | POA: Insufficient documentation

## 2016-07-14 DIAGNOSIS — H0012 Chalazion right lower eyelid: Secondary | ICD-10-CM | POA: Insufficient documentation

## 2016-07-14 DIAGNOSIS — H5711 Ocular pain, right eye: Secondary | ICD-10-CM | POA: Insufficient documentation

## 2016-07-14 DIAGNOSIS — Z0101 Encounter for examination of eyes and vision with abnormal findings: Secondary | ICD-10-CM | POA: Insufficient documentation

## 2016-07-14 DIAGNOSIS — H029 Unspecified disorder of eyelid: Secondary | ICD-10-CM

## 2016-07-14 DIAGNOSIS — Z8249 Family history of ischemic heart disease and other diseases of the circulatory system: Secondary | ICD-10-CM | POA: Insufficient documentation

## 2016-07-14 DIAGNOSIS — Z806 Family history of leukemia: Secondary | ICD-10-CM | POA: Insufficient documentation

## 2016-07-14 NOTE — Progress Notes (Addendum)
[name]  Operated by [facility]  [place] [address]  Dept: [place] [facility] Note    07/14/2016  [name],    Date of Birth:  October 26, 1955    No referring provider defined for this encounter.    Ophthalmologist/Optometrist:     SURGERY  Procedure: RLL ear cartilage elevation revision   Diagnosis: No diagnosis found.  Anesthesia: general  Length of time:  21minutes  Special Needs:2W       The patient is a 60 y.o. female here with complaint of:  Chief Complaint     Eye Problem        HPI     Pt is here today for 1 wk f/u; Chalazion RLL.   Pt notes that she was seen by Dr. [name] last Tuesday on 07/06/16.  She stated that she still notices a small bump on her RLL that will not go away. Pt notes that she has been using ointment and warm compresses, but they are still not helping.   Pt notes that she is experiencing a achy like pain in her OD.        Last edited by Karolee Stamps, Ophthalmic Testing Assistant on 07/14/2016  2:46 PM.         History of Sun Exposure:yes If yes: skin cancer  Ptosis evaluation: Not applicable    MD Addition to HPI: Pt is here with c/o of a RLL chalazion. She has been using WC , but noticed no change. The bump has become very painful to the pt. Seems to be getting worse. Pt had a BLL retraction via ear cartilage elevation (01/28/2015). 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  She denies dryness. The lesion is sometime red.       Impression and Plan:    1. RLL lesion that appears attached to the ear cartilage graft and is very tender.  Rec revision ear cartilage (lateral inferior edge)  and biospy of lesion.     Recommended: RLL ear cartilage elevation revision   R&B including pain, bleeding, infection, blindness, dryness, need for further surgery, death. Pt understands and wishes to proceed.   Pt understands risk of stroke, heart attack, and death stopping anticoagulants for surgery.  Barbara Cash, MD  07/14/2016, 15:25    I have reviewed and confirmed the ROS, [place], and exam elements performed and documented by the technician. The scribed portion of the progress note was scribed on my behalf and at my direction. I have reviewed and attest to the accuracy of the note.   Barbara Cash, MD 07/14/2016, 15:25    I saw and examined the patient.  I reviewed the resident's note.  I agree with the findings and plan of care as documented in the resident's note.  Any exceptions/additions are edited/noted.    Barbara Cash, MD                   Past Ocular history:  Chalazion RLL    ROS     Positive for: Constitutional (wearing glasses RX), Endocrine (GERD), Eyes (chalazion RLL)    Negative for: Gastrointestinal, Neurological, Skin, Genitourinary, Musculoskeletal, HENT, Cardiovascular, Respiratory, Psychiatric, Allergic/Imm, Heme/Lymph    Last edited by Karolee Stamps, Ophthalmic Testing Assistant on 07/14/2016  2:46 PM. (History)            Eye Examination:        Not recorded  Not recorded         Not recorded         Not recorded         Not recorded           Not recorded           OD    OS                                                                Sensation   V1: Intact bilaterally   V2: Intact bilaterally   V3: Intact bilaterally    Facial Contours:     Orbit   Exophthalmometry: OD:       OS:       Displacement: Vertical:  mm     Horizontal: mm    Auscultation:  Nodes:    Eyelids: ? inclusion cyst around edge of cartilage, 48mm x 3mm, moves with EOM and appears to be attached to the cartilage.  blepharitis present, mild staining   Bell's:  Corneal sensation:    Lid edema [id number] OS: None Conj chem OD: trace [id number]   Lid inject OD: trace OS: None Conj inject OD: trace OS: None             PF   MRD                       LF   LC                       USS   LSS                    LAG              SLE  OD OS     Tear Assessment      Cornea Tr inf PEE Clear     AC D&Q D&Q     Iris intact intact    Lens Clear Clear       IOP straight  Not recorded            Upgaze     5 down        Fundus Dilated no    Last Dilated 11/30/15     OD OS   Disc     Macula     Vessels     Periphery            Past Medical / Surgical / Family / Social History:      has a past medical history of Arthritis; Depression; GERD (gastroesophageal reflux disease); Heart murmur (1979); Hyperlipidemia LDL goal < 100 (08/29/2013); MINOR CAD (coronary artery disease) (11/10/2012); Multinodular goiter; Rheumatic fever (16); Squamous cell carcinoma ([place]) (02/06/2015); and Vaginal prolapse (2012). She also has no past medical history of Allergic rhinitis; Anemia; Anesthesia complication; Angina at rest [facility]); Angina pectoris ([place]); Aortic aneurysm ([place]); Asthma; Asthma; Back problem; Blood transfusion, without reported diagnosis; Breast CA ([place]); Bruises easily; Cervical cancer ([place]); Chronic bronchitis ([place]); Clotting disorder ([place]); Cognitive dysfunction; Colon cancer ([place]); Congestive heart failure, unspecified; COPD (chronic obstructive pulmonary disease) ([place]); CVA (cerebrovascular accident) ([place]); Deep vein thrombosis (DVT) ([place]); Diabetes; Diabetes mellitus ([place]); Diabetes mellitus type II; Diabetes mellitus,  type 1; Diabetes mellitus, type 2 ([place]); Difficult intubation; Difficult intubation; Difficulty waking; Disorder of liver; Endometrial cancer ([place]); H/O hearing loss; H/O urinary tract infection; Hearing loss; Hepatic cirrhosis ([place]); Hepatitis A virus infection; HTN; HTN (hypertension); breast cancer; Hypertension; Kidney disease; Loss of weight; Malignant hyperthermia; Myocardial infarction ([place]); Nausea with vomiting; Neck problem; Other convulsions; Ovarian cancer ([place]); Panic attack; Personal history of irradiation; Personal history of malignant neoplasm of prostate; PONV (postoperative nausea and vomiting); Pseudocholinesterase deficiency; Renal failure; Seizure ([place]); Seizures; Shortness of breath; Sleep  apnea; Spinal headache; STD (sexually transmitted disease); Stroke [facility]); Thyroid disease; Thyroid disorder; Treatment; Type 2 diabetes mellitus; Type 2 diabetes mellitus ([place]); Type I diabetes mellitus ([place]); Upper respiratory infection; Uterine cancer ([place]); Valvular disease; Viral hepatitis B; or Viral hepatitis C.    Past Surgical History:   Procedure Laterality Date   . COLONOSCOPY  01/06/09    COLONOSCOPY performed by Loletta Parish F at [place]   . GASTROSCOPY  03/05/2010    GASTROSCOPY performed by Jocelyn Lamer at [place]   . GASTROSCOPY WITH BIOPSY  03/05/2010    GASTROSCOPY WITH BIOPSY performed by Jocelyn Lamer at [place]   . HX ADENOIDECTOMY     . HX ANKLE FRACTURE [place]  2007    left distal fibula, casted   . HX APPENDECTOMY     . HX CHOLECYSTECTOMY     . HX COLONOSCOPY     . HX CYSTOCELE REPAIR  09/24/09   . HX HAND SURGERY  2012    for Carpal tunnel    . HX OOPHORECTOMY      left ovary removed   . HX TONSILLECTOMY     . HX TOTAL VAGINAL HYSTERECTOMY  1979   . HX WRIST FRACTURE [place]  2007    [place] injury   . PB REVISE ULNAR NERVE AT [place]  1979   . PB UPPER GI ENDOSCOPY,BIOPSY  12/19/07    patulous GE junction zone, erythema, nonerosive GERD           Family History   Problem Relation Age of Onset   . Congestive Heart Failure Father 49     died at 71   . Coronary Artery Disease Father    . Heart Attack Father    . Heart Attack Sister      90   . Kidney Disease Sister      ESRD   . Stroke Paternal Grandmother    . Diabetes Mother    . High Cholesterol Mother    . Hypertension Mother    . Coronary Artery Disease Mother 23     CABG   . Thyroid Disease Sister    . Hypertension Brother    . Hypertension Sister    . Hypertension Sister    . Bipolar Disorder Daughter    . Cancer Paternal Aunt      Breast Ca   . Breast Cancer Paternal Aunt    . Cancer Paternal Aunt      Breast Ca   . Breast Cancer Paternal Aunt    . Cancer Paternal Aunt      Breast Ca   . Breast Cancer Paternal Aunt    . Cancer Paternal Aunt       Breast Ca   . Breast Cancer Paternal Aunt    . Cancer Paternal Aunt      Breast Ca   . Breast Cancer Paternal Aunt    . Leukemia Paternal Uncle    .  Cancer Other 56     Breast Ca/Breast Ca   . Breast Cancer Other    . Leukemia Paternal Uncle            Social History   Substance Use Topics   . Smoking status: Former Smoker     Packs/day: 2.00     Years: 40.00     Types: Cigarettes     Quit date: 01/22/2005   . Smokeless tobacco: Never Used   . Alcohol use No       I have seen and examined the above patient. I discussed the above diagnoses listed in the assessment and the above ophthalmic plan of care with the patient and patient's family. All questions were answered. I reviewed and, when necessary, made changes to the technician/resident note, documented ophthalmology exam, chief complaint, history of present illness, allergies, review of systems, past medical, past surgical, family and social history.            I am scribing for, and in the presence of, Dr. Kennith Center for services provided on 07/14/2016.  Chelsea Aus, [place], [place]  07/14/2016, 14:57

## 2016-07-16 ENCOUNTER — Encounter (HOSPITAL_BASED_OUTPATIENT_CLINIC_OR_DEPARTMENT_OTHER): Payer: Self-pay | Admitting: GENERAL

## 2016-07-16 ENCOUNTER — Ambulatory Visit (HOSPITAL_BASED_OUTPATIENT_CLINIC_OR_DEPARTMENT_OTHER): Payer: Self-pay | Admitting: GENERAL

## 2016-07-16 ENCOUNTER — Ambulatory Visit: Payer: MEDICAID | Attending: Family Medicine | Admitting: GENERAL

## 2016-07-16 ENCOUNTER — Ambulatory Visit (HOSPITAL_BASED_OUTPATIENT_CLINIC_OR_DEPARTMENT_OTHER): Payer: MEDICAID | Admitting: Family Medicine

## 2016-07-16 VITALS — BP 134/71 | HR 79 | Temp 97.2°F | Ht 65.0 in | Wt 222.9 lb

## 2016-07-16 DIAGNOSIS — Z7982 Long term (current) use of aspirin: Secondary | ICD-10-CM | POA: Insufficient documentation

## 2016-07-16 DIAGNOSIS — N3090 Cystitis, unspecified without hematuria: Secondary | ICD-10-CM | POA: Insufficient documentation

## 2016-07-16 DIAGNOSIS — Z79899 Other long term (current) drug therapy: Secondary | ICD-10-CM | POA: Insufficient documentation

## 2016-07-16 LAB — URINALYSIS, MACROSCOPIC
BILIRUBIN: NEGATIVE mg/dL
BILIRUBIN: NEGATIVE mg/dL
BLOOD: NEGATIVE mg/dL
COLOR: NORMAL
GLUCOSE: NEGATIVE mg/dL
KETONES: NEGATIVE mg/dL
LEUKOCYTES: NEGATIVE WBCs/uL
NITRITE: NEGATIVE
PH: 6 (ref 5.0–8.0)
PH: 6 (ref 5.0–8.0)
PROTEIN: NEGATIVE mg/dL
SPECIFIC GRAVITY: 1.015 (ref 1.005–1.030)
UROBILINOGEN: NEGATIVE mg/dL

## 2016-07-16 LAB — URINALYSIS, MICROSCOPIC
RBCS: 1 /HPF (ref <6.0)
WBCS: 1 /HPF (ref <11.0)

## 2016-07-16 MED ORDER — SULFAMETHOXAZOLE 800 MG-TRIMETHOPRIM 160 MG TABLET
1.00 | ORAL_TABLET | Freq: Two times a day (BID) | ORAL | 0 refills | Status: AC
Start: 2016-07-16 — End: 2016-07-19

## 2016-07-16 MED ORDER — PHENAZOPYRIDINE 200 MG TABLET
200.00 mg | ORAL_TABLET | Freq: Three times a day (TID) | ORAL | 0 refills | Status: AC
Start: 2016-07-16 — End: 2016-07-18

## 2016-07-16 NOTE — Telephone Encounter (Signed)
-----   Message from Greig Castilla sent at 07/16/2016 10:37 PM EDT -----  Patient calling because she was seen in clinic today for a possible UTI and Dr. Berdine Addison prescribed her phenazopyridine and trimethoprim-sulfamethoxazole and she just went to the bathroom for the first time all day and her urine is red and she would like to speak to someone. Paged and connected Dr. Elita Boone. MKK

## 2016-07-16 NOTE — Progress Notes (Signed)
[facility]   Department of Family Medicine   Clinic Progress Note    [name]  MRN: [id number]  DOB: [birth year]  Date of Service: 07/16/2016    CHIEF COMPLAINT  Chief Complaint   Patient presents with    Abdominal Pain     lower abdominal, started about a week ago        SUBJECTIVE  [name] is a 60 y.o. female who presents to clinic for the following:    Suprapubic pain, urinary frequency, dysuria began 5 days ago. No fever, nausea, vomiting, flank pain or radiation of abdominal pain. Normal bowel movements daily. No abnormal vaginal discharge or bleeding. No recent UTI's, hospitalizations or antibiotic usage.        Review of Systems:  Constitutional: negative for fevers, chills  Gastrointestinal: negative for reflux symptoms, nausea, vomiting, change in bowel habits  Genitourinary: ++suprapubic pain, dysuria, frequency      Medications:     Outpatient Medications Prior to Visit:  aspirin 81 mg Oral Tablet, Chewable Take 1 Tab (81 mg total) by mouth Once a day   Azelaic Acid 20 % Cream Apply topically Twice daily   benzonatate (TESSALON) 100 mg Oral Capsule One or two caps three times a day as needed for cough   Clobetasol (TEMOVATE) 0.05 % Ointment Apply topically Twice daily To rash on hands and feet   FLUoxetine (PROZAC) 20 mg Oral Tablet Take 1 Tab (20 mg total) by mouth Once a day   gabapentin (NEURONTIN) 300 mg Oral Capsule Take 2 Caps (600 mg total) by mouth Three times a day   Halobetasol Propionate (ULTRAVATE) 0.05 % Cream Apply  topically. Three times a week   hydroCHLOROthiazide (HYDRODIURIL) 12.5 mg Oral Tablet Take 1 Tab (12.5 mg total) by mouth Once a day   Metronidazole (METROCREAM) 0.75 % Cream Apply topically Twice daily   mineral oil-hydrophil petrolat (AQUAPHOR) Ointment by Apply Topically route Four times a day as needed   neomycin-polymyxin-dexamethasone (MAXITROL) 3.5 mg/g-10,000 unit/g-0.1 % Ophthalmic Ointment Instill into right eye Four times a day      nitroGLYCERIN (NITROSTAT) 0.4 mg Sublingual Tablet, Sublingual 1 Tab (0.4 mg total) by Sublingual route Every 5 minutes as needed for Chest pain for up to 3 doses for 3 doses over 15 minutes   nystatin (NYSTOP) 100,000 unit/gram Powder Apply once or twice daily to PREVENT rash under breasts   pantoprazole (PROTONIX) 40 mg Oral Tablet, Delayed Release (E.C.) Take 1 Tab (40 mg total) by mouth Every morning before breakfast   raNITIdine (ZANTAC) 15 mg/mL Oral Syrup Take 10 mL (150 mg total) by mouth Every night   Sulfacetamide Sodium-Sulfur 10-5 % (w/w) Cleanser 1 Squirt by Apply externally route Once a day   triamcinolone acetonide (ARISTOCORT A) 0.1 % Ointment by Apply Topically route Twice daily     No facility-administered medications prior to visit.     Allergies:   Allergies   Allergen Reactions    Cymbalta [Duloxetine]      Rash on feet and hands    Blue Dye      Told to avoid due to patch test result by Simone Curia    Lipitor [Atorvastatin]      Muscle pain    Nickel     Codeine Itching     RASH    Mobic [Meloxicam] Nausea/ Vomiting    Naprosyn [Naproxen] Nausea/ Vomiting         OBJECTIVE  BP 134/71  Pulse 79   Temp 36.2 C (97.2 F) (Thermal Scan)    Ht 1.651 m (5\' 5" )   Wt 101.1 kg (222 lb 14.2 oz)   SpO2 92%   BMI 37.09 kg/m2  General: appears uncomfortable  HENT: mouth mucous membranes moist, pharynx without injection or exudate   Lungs: clear to auscultation bilaterally with normal respiratory effort  Cardiovascular: RRR, no murmurs  Abdomen: soft, protuberant, no rebound or guarding, bowel sounds normoactive. Suprapubic tenderness to palpation. No other abdominal tenderness  Extremities: no cyanosis or edema  Skin: warm and dry, no rash  Neurologic: awake and alert, gait is normal, CN 2-12 grossly intact  Psychiatric: normal affect and behavior    ASSESSMENT/PLAN  (N30.90) Cystitis  (primary encounter diagnosis)  Plan: Poct Urine Dipstick, URINALYSIS, MACROSCOPIC         AND MICROSCOPIC W/CULTURE  REFLEX, URINALYSIS,         MACROSCOPIC AND MICROSCOPIC W/CULTURE REFLEX,         URINALYSIS, MACROSCOPIC        Bactrim DS for 3 days, Pyridium for 2 days, Motrin/Tylenol PRN for pain. Return in 2-3 days if symptoms do not improve or worsen      Orders Placed This Encounter    URINALYSIS, MACROSCOPIC AND MICROSCOPIC W/CULTURE REFLEX    URINALYSIS, MACROSCOPIC    Poct Urine Dipstick    trimethoprim-sulfamethoxazole (BACTRIM DS) 160-800mg  per tablet    phenazopyridine (PYRIDIUM) 200 mg Oral Tablet         Return if symptoms worsen or fail to improve.        Rockwell Germany, MD 07/16/2016, 11:34      I discussed the patient's care with the Resident prior to the patient leaving the clinic. Any significant discussion points are noted .    Verline Lema, MD

## 2016-07-16 NOTE — Nursing Note (Signed)
07/16/16 1100   Required: Location Test Performed At:   [facility] [address], [place] [address]   Urine test  (S[employer] Multistix 10 SG)   Time collected 1120   Color Yellow   Clarity Clear   Glucose Negative   Bilirubin Negative   Ketones Negative   Urine Specific Gravity 1.015   Blood (urine) Moderate Non Hemolyzed   pH 6.5   Protein Negative   Urobilinogen Normal    Nitrite Negative   Leukocytes Negative   Bottle Number   (S[employer] Multistix 10 SG) [id number]   Lot # [id number]   Expiration Date 02/21/17   Initials br

## 2016-07-16 NOTE — Telephone Encounter (Signed)
Patient reports about 1 episode of blood-tinged in her urine. She is wondering if this is something that she needs to go to the ED asap. She was started on Bactrim and Pyridium for UTI today. Pt had hx of uterine sx and is on ASA. She has follow-up with Dr. Berdine Addison next Thursday but recommended earlier follow-up. Patient will call tomorrow morning to schedule in UTC as patient is still not sure on when she can make the appointment due to transportation issues.     Educated about signs and symptoms on when to go to the ED. Patient understood and has agreed to the plan.

## 2016-07-22 ENCOUNTER — Ambulatory Visit: Payer: MEDICAID | Attending: Geriatric Medicine | Admitting: GENERAL

## 2016-07-22 ENCOUNTER — Encounter (HOSPITAL_BASED_OUTPATIENT_CLINIC_OR_DEPARTMENT_OTHER): Payer: Self-pay | Admitting: GENERAL

## 2016-07-22 VITALS — BP 132/78 | HR 71 | Temp 96.8°F | Ht 65.0 in | Wt 224.9 lb

## 2016-07-22 DIAGNOSIS — Z885 Allergy status to narcotic agent status: Secondary | ICD-10-CM | POA: Insufficient documentation

## 2016-07-22 DIAGNOSIS — Z01818 Encounter for other preprocedural examination: Secondary | ICD-10-CM

## 2016-07-22 DIAGNOSIS — Z7982 Long term (current) use of aspirin: Secondary | ICD-10-CM | POA: Insufficient documentation

## 2016-07-22 DIAGNOSIS — R0789 Other chest pain: Secondary | ICD-10-CM | POA: Insufficient documentation

## 2016-07-22 DIAGNOSIS — Z79899 Other long term (current) drug therapy: Secondary | ICD-10-CM | POA: Insufficient documentation

## 2016-07-22 NOTE — Progress Notes (Signed)
[facility]   Department of Family Medicine   Clinic Progress Note    [name]  MRN: [id number]  DOB: 09/26/1956  Date of Service: 07/22/2016    CHIEF COMPLAINT  Chief Complaint   Patient presents with    Pre-OP H & P       SUBJECTIVE  [name] is a 60 y.o. female who presents to clinic for the following:    Pre-op evaluation for a a right lower eyelid revision surgery with Dr Levonne Lapping on 08/10/16.     No acute complaints today. No prior history of problems with anesthesia. Over the past few weeks she has had a few episodes of vague sternal chest pressure which does not radiate, and is not associated with shortness of breath or other symptoms.        Review of Systems:  Constitutional: negative for fevers, chills  Eyes: negative for visual disturbance  Ears, nose, mouth, throat, and face: negative for earaches, nasal congestion  ++sore throat  Respiratory: negative for cough or dyspnea  Cardiovascular: negative for chest pain and palpitations  ++occasional chest pressure  Gastrointestinal: negative for reflux symptoms, nausea, vomiting, change in bowel habits  Genitourinary: negative for dysuria and hematuria  Integument: negative for rash  Musculoskeletal: ++pain in both knees  Neurological: negative for paresthesia and weakness  ++headaches      Medications:     Outpatient Medications Prior to Visit:  aspirin 81 mg Oral Tablet, Chewable Take 1 Tab (81 mg total) by mouth Once a day   Azelaic Acid 20 % Cream Apply topically Twice daily   benzonatate (TESSALON) 100 mg Oral Capsule One or two caps three times a day as needed for cough   Clobetasol (TEMOVATE) 0.05 % Ointment Apply topically Twice daily To rash on hands and feet   FLUoxetine (PROZAC) 20 mg Oral Tablet Take 1 Tab (20 mg total) by mouth Once a day   gabapentin (NEURONTIN) 300 mg Oral Capsule Take 2 Caps (600 mg total) by mouth Three times a day   Halobetasol Propionate (ULTRAVATE) 0.05 % Cream Apply  topically. Three  times a week   hydroCHLOROthiazide (HYDRODIURIL) 12.5 mg Oral Tablet Take 1 Tab (12.5 mg total) by mouth Once a day   Metronidazole (METROCREAM) 0.75 % Cream Apply topically Twice daily   mineral oil-hydrophil petrolat (AQUAPHOR) Ointment by Apply Topically route Four times a day as needed   neomycin-polymyxin-dexamethasone (MAXITROL) 3.5 mg/g-10,000 unit/g-0.1 % Ophthalmic Ointment Instill into right eye Four times a day   nitroGLYCERIN (NITROSTAT) 0.4 mg Sublingual Tablet, Sublingual 1 Tab (0.4 mg total) by Sublingual route Every 5 minutes as needed for Chest pain for up to 3 doses for 3 doses over 15 minutes   nystatin (NYSTOP) 100,000 unit/gram Powder Apply once or twice daily to PREVENT rash under breasts   pantoprazole (PROTONIX) 40 mg Oral Tablet, Delayed Release (E.C.) Take 1 Tab (40 mg total) by mouth Every morning before breakfast   raNITIdine (ZANTAC) 15 mg/mL Oral Syrup Take 10 mL (150 mg total) by mouth Every night   Sulfacetamide Sodium-Sulfur 10-5 % (w/w) Cleanser 1 Squirt by Apply externally route Once a day   triamcinolone acetonide (ARISTOCORT A) 0.1 % Ointment by Apply Topically route Twice daily     No facility-administered medications prior to visit.     Allergies:   Allergies   Allergen Reactions    Cymbalta [Duloxetine]      Rash on feet and hands  Blue Dye      Told to avoid due to patch test result by [facility] [place]    Lipitor [Atorvastatin]      Muscle pain    Nickel     Codeine Itching     RASH    Mobic [Meloxicam] Nausea/ Vomiting    Naprosyn [Naproxen] Nausea/ Vomiting         OBJECTIVE  BP 132/78   Pulse 71   Temp 36 C (96.8 F)   Ht 1.651 m (5\' 5" )   Wt 102 kg (224 lb 13.9 oz)   SpO2 94%   BMI 37.42 kg/m2  General: no distress  HENT: mouth mucous membranes moist, pharynx without injection or exudate   Lungs: clear to auscultation bilaterally with normal respiratory effort  Cardiovascular: RRR, no murmurs, radial pulses equal  Abdomen: soft, non tender, bowel sounds  present  Extremities: no cyanosis or edema  Skin: warm and dry, no rash  Neurologic: awake and alert, gait is normal, CN 2-12 grossly intact  Psychiatric: normal affect and behavior    ASSESSMENT/PLAN  (Z01.818) Pre-op evaluation  (primary encounter diagnosis)  Plan: ECG 12-LEAD (UTC ONLY)          ECG performed today shows NSR rate 66, normal intervals, low voltage QRS, no signs of ischemia    Patient advised to hold ASA for 7 days prior to surgery.    She has zero Nyoka Cowden risk factors and thus is at a 0.4% risk for major adverse cardiac events with this surgery.    Revised Cardiac Risk Index   Intrathoracic or intraperitoneal surgery 0   CAD (Hx-MI, (+) EST, angina, nitroglycerin use, or path Q's) 0   Heart failure 0   Creatine greater than 2.0 0   Insulin-requiring diabetes mellitus 0   TIA or CVA (do not include dizziness) 0  Total Points: 0    The risk of major cardiac complications varies according to the number of risk factors. If one includes only cardiac death, nonfatal MI, and nonfatal cardiac arrest as major cardiac events in the [place], the following rates of adverse outcomes were seen:  No risk factors -- 0.4 percent (95% CI 0.1-0.8 percent)   One risk factor -- 1.0 percent (95% CI 0.5-1.4 percent)   Two risk factors -- 2.4 percent (95% CI 1.3-3.5 percent)   Three or more risk factors -- 5.4 percent (95% CI 2.8-7.9 percent)    *Perioperative cardiac events in patients undergoing noncardiac surgery: a review of the magnitude of the problem, the pathophysiology of the events and methods to estimate and communicate risk.Devereaux PJ, Merilyn Baba [place]. 2005;173(6):627.       Orders Placed This Encounter    ECG 12-LEAD (UTC ONLY)         Return in about 2 months (around 09/21/2016).        Rockwell Germany, MD 07/22/2016, 10:26  Case reviewed and discussed with resident prior to, or immediately after the patient leaving clinic. I have reviewed and discussed  the residents: history    and examination   . I agree with resident plan of care    or revise as follows:

## 2016-07-23 LAB — ECG 12-LEAD (UTC ONLY)
Atrial Rate: 66 {beats}/min
Atrial Rate: 66 {beats}/min
Calculated P Axis: 52 degrees
Calculated T Axis: 19 degrees
PR Interval: 156 ms
QRS Duration: 82 ms
QT Interval: 418 ms
QTC Calculation: 438 ms
Ventricular rate: 66 {beats}/min

## 2016-07-28 ENCOUNTER — Ambulatory Visit: Payer: MEDICAID | Attending: Dermatology | Admitting: Student in an Organized Health Care Education/Training Program

## 2016-07-28 VITALS — Temp 97.0°F | Ht 66.58 in | Wt 225.5 lb

## 2016-07-28 DIAGNOSIS — L821 Other seborrheic keratosis: Secondary | ICD-10-CM | POA: Insufficient documentation

## 2016-07-28 DIAGNOSIS — L301 Dyshidrosis [pompholyx]: Secondary | ICD-10-CM | POA: Insufficient documentation

## 2016-07-28 DIAGNOSIS — Z79899 Other long term (current) drug therapy: Secondary | ICD-10-CM | POA: Insufficient documentation

## 2016-07-28 DIAGNOSIS — Z7982 Long term (current) use of aspirin: Secondary | ICD-10-CM | POA: Insufficient documentation

## 2016-07-28 DIAGNOSIS — Z808 Family history of malignant neoplasm of other organs or systems: Secondary | ICD-10-CM | POA: Insufficient documentation

## 2016-07-28 DIAGNOSIS — L719 Rosacea, unspecified: Principal | ICD-10-CM | POA: Insufficient documentation

## 2016-07-28 MED ORDER — DOXYCYCLINE HYCLATE 100 MG CAPSULE
100.00 mg | ORAL_CAPSULE | Freq: Two times a day (BID) | ORAL | 2 refills | Status: DC
Start: 2016-07-28 — End: 2017-12-12

## 2016-07-29 NOTE — Progress Notes (Signed)
Subjective:       Patient ID: [name] is a 60 y.o. female     Chief Complaint:     Chief Complaint   Patient presents with    Skin Check        HPI    60 y.o. female with no personal hx of skin cancer with family history of NMSC as well as melanoma in her niece presents for a follow up.  Pt notes worsening of rosacea. She has been using metonidazole. Sulfur wash was not covered. She also thinks her eyes are becoming involved becasue she has irritation and redness . She notes recurrent chalazion of right eye but surgical optho is following her right eye for cartilage graft that was placed in her orbit for dry eyes, she is having surgery to remove graft within the next two weeks. She notes she does have history of being allergic to blue dye but denies anaphylaxis symptoms including hives and swelling of her throat.. Otherwise  denies any new, changing, bleeding, or rapidly growing lesions and has no other skin-related complaints.      Pt has hx of dishydrotic eczema.She has a history of 1+ positive patch test to thimerosal and potassium dichromate.     Review of Systems   Constitutional: Negative for chills and fever.   Skin: Negative for rash.        - pruritus.     Current Outpatient Prescriptions   Medication Sig    aspirin 81 mg Oral Tablet, Chewable Take 1 Tab (81 mg total) by mouth Once a day    Azelaic Acid 20 % Cream Apply topically Twice daily    benzonatate (TESSALON) 100 mg Oral Capsule One or two caps three times a day as needed for cough (Patient not taking: Reported on 07/28/2016)    Clobetasol (TEMOVATE) 0.05 % Ointment Apply topically Twice daily To rash on hands and feet    doxycycline hyclate (VIBRAMYCIN) 100 mg Oral Capsule Take 1 Cap (100 mg total) by mouth Twice daily    FLUoxetine (PROZAC) 20 mg Oral Tablet Take 1 Tab (20 mg total) by mouth Once a day    gabapentin (NEURONTIN) 300 mg Oral Capsule Take 2 Caps (600 mg total) by mouth Three times a day    Halobetasol Propionate  (ULTRAVATE) 0.05 % Cream Apply  topically. Three times a week    hydroCHLOROthiazide (HYDRODIURIL) 12.5 mg Oral Tablet Take 1 Tab (12.5 mg total) by mouth Once a day    Metronidazole (METROCREAM) 0.75 % Cream Apply topically Twice daily    mineral oil-hydrophil petrolat (AQUAPHOR) Ointment by Apply Topically route Four times a day as needed    neomycin-polymyxin-dexamethasone (MAXITROL) 3.5 mg/g-10,000 unit/g-0.1 % Ophthalmic Ointment Instill into right eye Four times a day    nitroGLYCERIN (NITROSTAT) 0.4 mg Sublingual Tablet, Sublingual 1 Tab (0.4 mg total) by Sublingual route Every 5 minutes as needed for Chest pain for up to 3 doses for 3 doses over 15 minutes    nystatin (NYSTOP) 100,000 unit/gram Powder Apply once or twice daily to PREVENT rash under breasts    pantoprazole (PROTONIX) 40 mg Oral Tablet, Delayed Release (E.C.) Take 1 Tab (40 mg total) by mouth Every morning before breakfast    raNITIdine (ZANTAC) 15 mg/mL Oral Syrup Take 10 mL (150 mg total) by mouth Every night    Sulfacetamide Sodium-Sulfur 10-5 % (w/w) Cleanser 1 Squirt by Apply externally route Once a day    triamcinolone acetonide (ARISTOCORT A) 0.1 %  Ointment by Apply Topically route Twice daily       Objective:   Marland Kitchen     Vitals:    07/28/16 1045   Temp: 36.1 C (97 F)   Weight: 102.3 kg (225 lb 8.5 oz)   Height: 1.691 m (5' 6.58")     Physical Exam   Constitutional: She appears well-nourished. No distress.   Skin:          General skin exam was performed including head, neck, anterior and posterior trunk, bilateral upper, and lower extremities and revealed no areas of concern other than those documented.    Assessment & Plan:     Rosacea (#1 body)  - discontinue topical rosacea tx  - Start doxycycline 100 mg BID for one month, taper to 100 mg daily and then eventually prn for flares  - Recommended using sunscreen with SPF at least 30 and re-apply every 2-3 hours.    SK  - Benign      Mannie Stabile, MD  07/29/2016,  13:27        I saw and examined the patient.  I reviewed the resident's note.  I agree with the findings and plan of care as documented in the resident's note.  Any exceptions/additions are edited/noted.    Dairl Ponder, MD

## 2016-08-02 ENCOUNTER — Encounter (HOSPITAL_BASED_OUTPATIENT_CLINIC_OR_DEPARTMENT_OTHER): Payer: MEDICAID | Admitting: GENERAL

## 2016-08-06 ENCOUNTER — Encounter (HOSPITAL_COMMUNITY): Payer: Self-pay

## 2016-08-06 ENCOUNTER — Ambulatory Visit
Admission: RE | Admit: 2016-08-06 | Discharge: 2016-08-06 | Disposition: A | Discharge status: Home / Self Care | Payer: MEDICAID | Source: Ambulatory Visit

## 2016-08-10 ENCOUNTER — Encounter (HOSPITAL_COMMUNITY)
Admission: RE | Disposition: A | Discharge status: Home / Self Care | Payer: Self-pay | Source: Ambulatory Visit | Attending: OPHTHALMOLOGY

## 2016-08-10 ENCOUNTER — Inpatient Hospital Stay
Admission: RE | Admit: 2016-08-10 | Discharge: 2016-08-10 | Disposition: A | Discharge status: Home / Self Care | Payer: MEDICAID | Source: Ambulatory Visit | Attending: OPHTHALMOLOGY | Admitting: OPHTHALMOLOGY

## 2016-08-10 ENCOUNTER — Encounter (HOSPITAL_COMMUNITY): Payer: Self-pay

## 2016-08-10 ENCOUNTER — Ambulatory Visit (HOSPITAL_BASED_OUTPATIENT_CLINIC_OR_DEPARTMENT_OTHER): Payer: MEDICAID | Admitting: OPHTHALMOLOGY

## 2016-08-10 ENCOUNTER — Ambulatory Visit (HOSPITAL_BASED_OUTPATIENT_CLINIC_OR_DEPARTMENT_OTHER): Payer: MEDICAID | Admitting: Certified Registered"

## 2016-08-10 ENCOUNTER — Other Ambulatory Visit (HOSPITAL_COMMUNITY): Payer: Self-pay | Admitting: OPHTHALMOLOGY

## 2016-08-10 ENCOUNTER — Ambulatory Visit (HOSPITAL_COMMUNITY): Payer: MEDICAID | Admitting: Certified Registered"

## 2016-08-10 DIAGNOSIS — Z833 Family history of diabetes mellitus: Secondary | ICD-10-CM | POA: Insufficient documentation

## 2016-08-10 DIAGNOSIS — Z7982 Long term (current) use of aspirin: Secondary | ICD-10-CM | POA: Insufficient documentation

## 2016-08-10 DIAGNOSIS — I2510 Atherosclerotic heart disease of native coronary artery without angina pectoris: Secondary | ICD-10-CM | POA: Insufficient documentation

## 2016-08-10 DIAGNOSIS — F329 Major depressive disorder, single episode, unspecified: Secondary | ICD-10-CM | POA: Insufficient documentation

## 2016-08-10 DIAGNOSIS — D3101 Benign neoplasm of right conjunctiva: Secondary | ICD-10-CM

## 2016-08-10 DIAGNOSIS — G510 Bell's palsy: Secondary | ICD-10-CM | POA: Insufficient documentation

## 2016-08-10 DIAGNOSIS — Z8669 Personal history of other diseases of the nervous system and sense organs: Secondary | ICD-10-CM | POA: Insufficient documentation

## 2016-08-10 DIAGNOSIS — Z87891 Personal history of nicotine dependence: Secondary | ICD-10-CM | POA: Insufficient documentation

## 2016-08-10 DIAGNOSIS — H0012 Chalazion right lower eyelid: Secondary | ICD-10-CM | POA: Insufficient documentation

## 2016-08-10 DIAGNOSIS — H02122 Mechanical ectropion of right lower eyelid: Secondary | ICD-10-CM | POA: Insufficient documentation

## 2016-08-10 DIAGNOSIS — H029 Unspecified disorder of eyelid: Secondary | ICD-10-CM

## 2016-08-10 DIAGNOSIS — Z79899 Other long term (current) drug therapy: Secondary | ICD-10-CM | POA: Insufficient documentation

## 2016-08-10 DIAGNOSIS — Z792 Long term (current) use of antibiotics: Secondary | ICD-10-CM | POA: Insufficient documentation

## 2016-08-10 DIAGNOSIS — G519 Disorder of facial nerve, unspecified: Secondary | ICD-10-CM

## 2016-08-10 DIAGNOSIS — Z6836 Body mass index (BMI) 36.0-36.9, adult: Secondary | ICD-10-CM | POA: Insufficient documentation

## 2016-08-10 DIAGNOSIS — H02135 Senile ectropion of left lower eyelid: Secondary | ICD-10-CM

## 2016-08-10 DIAGNOSIS — Z01818 Encounter for other preprocedural examination: Secondary | ICD-10-CM

## 2016-08-10 DIAGNOSIS — H2513 Age-related nuclear cataract, bilateral: Secondary | ICD-10-CM | POA: Insufficient documentation

## 2016-08-10 DIAGNOSIS — H02132 Senile ectropion of right lower eyelid: Secondary | ICD-10-CM

## 2016-08-10 DIAGNOSIS — Z7952 Long term (current) use of systemic steroids: Secondary | ICD-10-CM | POA: Insufficient documentation

## 2016-08-10 DIAGNOSIS — E669 Obesity, unspecified: Secondary | ICD-10-CM | POA: Insufficient documentation

## 2016-08-10 DIAGNOSIS — K219 Gastro-esophageal reflux disease without esophagitis: Secondary | ICD-10-CM | POA: Insufficient documentation

## 2016-08-10 DIAGNOSIS — T86828 Other complications of skin graft (allograft) (autograft): Secondary | ICD-10-CM | POA: Insufficient documentation

## 2016-08-10 DIAGNOSIS — Z79891 Long term (current) use of opiate analgesic: Secondary | ICD-10-CM | POA: Insufficient documentation

## 2016-08-10 HISTORY — DX: Shortness of breath: R06.02

## 2016-08-10 SURGERY — EYELID RETRACTION REPAIR WITH EAR CARTILAGE GRAFT
Anesthesia: General | Site: Eye | Laterality: Right | Wound class: Clean Wound: Uninfected operative wounds in which no inflammation occurred

## 2016-08-10 MED ORDER — SODIUM CHLORIDE 0.9 % (FLUSH) INJECTION SYRINGE
2.00 mL | INJECTION | Freq: Three times a day (TID) | INTRAMUSCULAR | Status: DC
Start: 2016-08-10 — End: 2016-08-10

## 2016-08-10 MED ORDER — HYDROCODONE 5 MG-ACETAMINOPHEN 325 MG TABLET
1.00 | ORAL_TABLET | Freq: Once | ORAL | Status: AC
Start: 2016-08-10 — End: 2016-08-10
  Administered 2016-08-10: 1 via ORAL
  Filled 2016-08-10 (×2): qty 1

## 2016-08-10 MED ORDER — SODIUM CHLORIDE 0.9 % IRRIGATION SOLUTION
Freq: Once | Status: DC
Start: 2016-08-10 — End: 2016-08-10

## 2016-08-10 MED ORDER — BUPIVACAINE (PF) 0.75 % (7.5 MG/ML) INJECTION SOLUTION
4.0000 mL | Freq: Once | INTRAMUSCULAR | Status: AC
Start: 2016-08-10 — End: 2016-08-10
  Administered 2016-08-10: 1 mL via INTRAMUSCULAR

## 2016-08-10 MED ORDER — LIDOCAINE 20 MG/ML (2 %)-EPINEPHRINE 1:100,000 INJECTION SOLUTION
10.0000 mL | Freq: Once | INTRAMUSCULAR | Status: DC
Start: 2016-08-10 — End: 2016-08-10

## 2016-08-10 MED ORDER — PROPOFOL 10 MG/ML IV BOLUS
INJECTION | Freq: Once | INTRAVENOUS | Status: DC | PRN
Start: 2016-08-10 — End: 2016-08-10
  Administered 2016-08-10: 200 mg via INTRAVENOUS

## 2016-08-10 MED ORDER — LACTATED RINGERS INTRAVENOUS SOLUTION
INTRAVENOUS | Status: DC
Start: 2016-08-10 — End: 2016-08-10
  Administered 2016-08-10: 0 via INTRAVENOUS

## 2016-08-10 MED ORDER — SODIUM CHLORIDE 0.9 % (FLUSH) INJECTION SYRINGE
2.00 mL | INJECTION | INTRAMUSCULAR | Status: DC | PRN
Start: 2016-08-10 — End: 2016-08-10

## 2016-08-10 MED ORDER — FENTANYL (PF) 50 MCG/ML INJECTION SOLUTION
25.0000 ug | INTRAMUSCULAR | Status: DC | PRN
Start: 2016-08-10 — End: 2016-08-10
  Filled 2016-08-10: qty 2

## 2016-08-10 MED ORDER — TOBRAMYCIN-DEXAMETHASONE 0.3 %-0.1 % EYE OINTMENT
TOPICAL_OINTMENT | Freq: Three times a day (TID) | OPHTHALMIC | 1 refills | Status: AC
Start: 2016-08-10 — End: 2016-08-20

## 2016-08-10 MED ORDER — DOCUSATE SODIUM 50 MG CAPSULE
50.00 mg | ORAL_CAPSULE | Freq: Two times a day (BID) | ORAL | 1 refills | Status: AC
Start: 2016-08-10 — End: 2016-08-20

## 2016-08-10 MED ORDER — LIDOCAINE 20 MG/ML (2 %)-EPINEPHRINE 1:100,000 INJECTION SOLUTION
10.0000 mL | Freq: Once | INTRAMUSCULAR | Status: AC
Start: 2016-08-10 — End: 2016-08-10
  Administered 2016-08-10: 4.5 mL via INTRADERMAL

## 2016-08-10 MED ORDER — LIDOCAINE (PF) 100 MG/5 ML (2 %) INTRAVENOUS SYRINGE
INJECTION | Freq: Once | INTRAVENOUS | Status: DC | PRN
Start: 2016-08-10 — End: 2016-08-10
  Administered 2016-08-10: 100 mg via INTRAVENOUS

## 2016-08-10 MED ORDER — SODIUM CHLORIDE 0.9 % (FLUSH) INJECTION SYRINGE
2.0000 mL | INJECTION | Freq: Three times a day (TID) | INTRAMUSCULAR | Status: DC
Start: 2016-08-10 — End: 2016-08-10

## 2016-08-10 MED ORDER — PROPARACAINE 0.5 % EYE DROPS
1.00 [drp] | Freq: Once | OPHTHALMIC | Status: AC
Start: 2016-08-10 — End: 2016-08-10
  Administered 2016-08-10: 1 [drp] via OPHTHALMIC

## 2016-08-10 MED ORDER — HYDROCODONE 5 MG-ACETAMINOPHEN 325 MG TABLET
1.00 | ORAL_TABLET | ORAL | 0 refills | Status: DC | PRN
Start: 2016-08-10 — End: 2017-12-12

## 2016-08-10 MED ORDER — MIDAZOLAM 1 MG/ML INJECTION SOLUTION
Freq: Once | INTRAMUSCULAR | Status: DC | PRN
Start: 2016-08-10 — End: 2016-08-10
  Administered 2016-08-10: 2 mg via INTRAVENOUS

## 2016-08-10 MED ORDER — EPHEDRINE SULFATE 50 MG/ML INJECTION SOLUTION
Freq: Once | INTRAMUSCULAR | Status: DC | PRN
Start: 2016-08-10 — End: 2016-08-10
  Administered 2016-08-10: 5 mg via INTRAVENOUS
  Administered 2016-08-10 (×3): 10 mg via INTRAVENOUS

## 2016-08-10 MED ORDER — ONDANSETRON HCL (PF) 4 MG/2 ML INJECTION SOLUTION
Freq: Once | INTRAMUSCULAR | Status: DC | PRN
Start: 2016-08-10 — End: 2016-08-10
  Administered 2016-08-10: 4 mg via INTRAVENOUS

## 2016-08-10 MED ORDER — ERYTHROMYCIN 5 MG/GRAM (0.5 %) EYE OINTMENT
TOPICAL_OINTMENT | Freq: Once | OPHTHALMIC | Status: DC
Start: 2016-08-10 — End: 2016-08-10

## 2016-08-10 MED ORDER — BALANCED SALT SOLUTION COMBINATION NO.2 INTRAOCULAR IRRIGATION
15.0000 mL | Freq: Once | INTRAOCULAR | Status: AC
Start: 2016-08-10 — End: 2016-08-10
  Administered 2016-08-10: 15 mL via OPHTHALMIC

## 2016-08-10 MED ORDER — FENTANYL (PF) 50 MCG/ML INJECTION SOLUTION
Freq: Once | INTRAMUSCULAR | Status: DC | PRN
Start: 2016-08-10 — End: 2016-08-10
  Administered 2016-08-10: 50 ug via INTRAVENOUS

## 2016-08-10 MED ORDER — SODIUM CHLORIDE 0.9 % (FLUSH) INJECTION SYRINGE
2.0000 mL | INJECTION | INTRAMUSCULAR | Status: DC | PRN
Start: 2016-08-10 — End: 2016-08-10

## 2016-08-10 MED ADMIN — bupivacaine (PF) 0.75 % (7.5 mg/mL) injection solution: INTRAMUSCULAR | @ 08:00:00

## 2016-08-10 MED ADMIN — sodium chloride 0.9 % intravenous solution: INTRAVENOUS | @ 08:00:00

## 2016-08-10 SURGICAL SUPPLY — 38 items
APPLICATOR COT TIP STRL 3IN 9318100 100/CS (WOUND CARE SUPPLY) ×3 IMPLANT
APPLICATOR COT TIP STRL 3IN 9318100 100/CS (WOUND CARE/ENTEROSTOMAL SUPPLY) ×3
CAUTERY OPTH VARI TEMP HOT_AA11 10EA/BX (CAUTERY SUPPLIES) ×2 IMPLANT
CORD BIPOLAR FOOTSWITCH 12FT E0509 50EA/CS STRL DISP (CAUTERY SUPPLIES) ×2 IMPLANT
DRAPE ADH STRP LRG TWL 23X17IN_STRDRP LF STRL DISP SURG (PROTECTIVE PRODUCTS/GARMENTS) ×1
DRAPE TWL PLASTIC ADH 23X17IN LRG STRDRP STRL SURG TRNSPR (PROTECTIVE PRODUCTS/GARMENTS) ×1 IMPLANT
DROPPER MED GLS 3IN 1.5ML STR_PIP STRL LF (Cautery Accessories) ×1
DROPPER MED GLS STR STY PIP RUB BULB STRL DISP 3IN (Cautery Accessories) ×1 IMPLANT
DUPE USE ITEM 319492 - SUTURE CHR GUT 7-0 TG140-8 MIC_ROP 18IN BLU 2 ARM MONOF ABS (SUTURE/WOUND CLOSURE) ×1 IMPLANT
DUPE USE ITEM 319508 - SUTURE 4-0 P2 VICRYL+ 18IN UND_YED BRD ANBCTRL COAT ABS (SUTURE/WOUND CLOSURE) ×2 IMPLANT
ELECTRODE ESURG BLADE PNCL 10FT VLAB STRL SS DISP BUTTON SWH HEX LOCK CORD HLSTR LF  ACPT 3/32IN STD (CAUTERY SUPPLIES) ×2 IMPLANT
ELECTRODE ESURG BLADE PNCL 10F_T VLAB STRL SS DISP BUTTON SWH (CAUTERY SUPPLIES) ×2
GARTER EYE ASST_28-6536 (OPHTHALMIC SUPPLIES (NOT LENS)) ×1 IMPLANT
GARTER EYE ASST_28-6536 (OPTHALMIC SUPPLIES (NOT LENS)) ×1
GOWN SURG XL AAMI L3 NONREINFO_RCE HKLP CLSR STRL LTX PNK SMS (DGOW)
GOWN SURG XL L3 NONREINFORCE HKLP CLSR STRL LTX PNK SMS 47IN (DGOW) IMPLANT
KIT SETUP MINOR SURGICAL (KITS & TRAYS (DISPOSABLE)) ×2 IMPLANT
LABEL E-Z STICK_STLEZP1 100EA/CS (LABELS/CHART SUPPLIES) ×1
LABEL MED EZ PEEL MRKR LF (LABELS/CHART SUPPLIES) ×1 IMPLANT
PACK EYE PREP (TRAY) ×1
PACK SURG CSTM EYE PREP STRL DISP LF (TRAY) ×1
PACK SURG CUSTOM EYE PREP STRL DISP LF (TRAY) ×1 IMPLANT
PACK SURG ECLIPSE EENT II TBL CVR SUT BAG HEAD TRBN DRP 90X50IN 40X27IN LF (DRAPE/PACKS/SHEETS/OR TOWEL) ×1 IMPLANT
PACK SURG EENT II DYNJP7010 (DRAPE/PACKS/SHEETS/OR TOWEL) ×1
SHIELD EYE 76.2X60.3MM ASST AL EY GRTR FOX CVR (OPHTHALMIC SUPPLIES (NOT LENS)) ×1 IMPLANT
SHIELD EYE 76.2X60.3MM ASST AL_EY GRTR FOX CVR (OPTHALMIC SUPPLIES (NOT LENS)) ×1
SPONGE LAP 3X.5IN 1 STNG NEURO STRP STRL (WOUND CARE SUPPLY) ×1 IMPLANT
SPONGE NEUROSURGICAL 1/2INX3IN_SNS1230 (WOUND CARE/ENTEROSTOMAL SUPPLY) ×1
SUTURE 4-0 P2 VICRYL+ 18IN UND_YED BRD ANBCTRL COAT ABS (SUTURE/WOUND CLOSURE) ×2
SUTURE 5-0 P-3 PROLENE 18IN BLU MONOF NONAB (SUTURE/WOUND CLOSURE) ×1 IMPLANT
SUTURE 5-0 P-3 PROLENE MTPS 18_IN BLU MONOF NONAB (SUTURE/WOUND CLOSURE) ×1
SUTURE CHR GUT 7-0 TG140-8 MIC_ROP 18IN BLU 2 ARM MONOF ABS (SUTURE/WOUND CLOSURE) ×1
SUTURE SILK 4-0 G3 PERMAHAND MICROP 18IN BLK 2 ARM BRD NONAB (SUTURE/WOUND CLOSURE) ×4 IMPLANT
SUTURE SILK 4-0 G3 PERMAHAND M_ICROP 18IN BLK 2 ARM BRD NONAB (SUTURE/WOUND CLOSURE) ×4
SYRINGE LL 10ML LF  STRL GRAD N-PYRG DEHP-FR PVC FREE MED DISP (NEEDLES & SYRINGE SUPPLIES) ×1 IMPLANT
SYRINGE LL 10ML LF STRL MED D_ISP (NEEDLES & SYRINGE SUPPLIES) ×1
TUBING SUCT CLR 20FT 9/32IN MEDIVAC NCDTV M/M CONN STRL LF (Suction) ×1 IMPLANT
TUBING SUCT CONN 20FT LONG_STRL N720A (Suction) ×1

## 2016-08-10 NOTE — Anesthesia Transfer of Care (Signed)
ANESTHESIA TRANSFER OF CARE   [name] is a 60 y.o. ,female, Weight: 102 kg (224 lb 13.9 oz)   had Procedure(s) with comments:  EYELID RETRACTION REPAIR WITH EAR CARTILAGE GRAFT - early for labs    performed  08/10/16   Primary Service: Charisse Klinefelter*    Past Medical History:   Diagnosis Date    Arthritis     Depression     GERD (gastroesophageal reflux disease)     controlled w/med    Heart murmur 1979    benign    Hyperlipidemia LDL goal < 100 08/29/2013    MINOR CAD (coronary artery disease) 11/10/2012    no treatment other than cholesterol medications; follows regularly with Dr. Rodney Booze    Multinodular goiter     Rheumatic fever 16    no complications    Shortness of breath     on exertion    Squamous cell carcinoma 02/06/2015    right side of neck    Vaginal prolapse 2012    surgery improved significantly    Wears glasses       Allergy History as of 08/10/16     CODEINE       Noted Status Severity Type Reaction    12/10/15 0941 Pantojas, [place]  Active Low  Itching    Comments:  RASH     09/19/13 1430 Thurston Pounds, RN  Deleted Low  Itching    Comments:  RASH     09/04/09 Ginette Pitman  Active   Itching    Comments:  RASH     10/10/07   Active       Comments:  RASH           STRAWBERRY       Noted Status Severity Type Reaction    03/18/11 1634 Johnnette Barrios  Deleted       10/10/08 [name], MD  Deleted       10/10/07   Active             MELOXICAM       Noted Status Severity Type Reaction    12/10/15 0941 Pantojas, Kristy 06/26/09 Active Low  Nausea/ Vomiting    06/26/09 Corky Sing Nacole 06/26/09 Active   Nausea/ Vomiting          NICKEL       Noted Status Severity Type Reaction    07/24/12 1041 [name], MD 07/24/12 Active  Topical           BLUE DYE       Noted Status Severity Type Reaction    07/24/12 1041 [name], MD 07/24/12 Active  Topical     Comments:  Told to avoid due to patch test result by Derm [place]            DULOXETINE       Noted Status Severity Type Reaction    07/24/12 1052 [name], MD 07/24/12 Active Medium Systemic     Comments:  Rash on feet and hands           NAPROXEN       Noted Status Severity Type Reaction    11/01/12 0913 Sanjuana Letters, MA 11/01/12 Active Low  Nausea/ Vomiting          ATORVASTATIN       Noted Status Severity Type Reaction    01/27/15 1035 [name], MD 01/27/15 Active  Side Effect  Comments:  Muscle pain               I completed my transfer of care / handoff to the receiving personnel during which we discussed:  Access, Airway, All key/critical aspects of case discussed, Analgesia, Antibiotics, Expectation of post procedure, Fluids/Product, Gave opportunity for questions and acknowledgement of understanding, Labs and PMHx                                            Additional Info:Transfer to PACU on 10L O2 via SFM. VSS. Report given to bedside RN.                    Last OR Temp: Temperature: 36 C (96.8 F)  ABG:  PCO2   Date Value Ref Range Status   02/19/2016 36.00 (L) 41.00 - 51.00 mm/Hg Final     PO2   Date Value Ref Range Status   02/19/2016 43.0 35.0 - 50.0 mm/Hg Final     POTASSIUM   Date Value Ref Range Status   07/01/2016 4.1 3.5 - 5.1 mmol/L Final   01/23/2015 4.3 3.5 - 5.1 mmol/L Final     KETONES   Date Value Ref Range Status   07/16/2016 Negative Negative mg/dL Final     WHOLE BLOOD POTASSIUM   Date Value Ref Range Status   02/19/2016 3.9 3.5 - 5.0 mmol/L Final     CALCIUM   Date Value Ref Range Status   07/01/2016 9.5 8.5 - 10.2 mg/dL Final   01/23/2015 9.8 8.5 - 10.4 mg/dL Final     Comment:     Test performed by [facility], [address], [place],  [place] [address]       Calculated P Axis   Date Value Ref Range Status   07/22/2016 52 degrees Final     Calculated R Axis   Date Value Ref Range Status   02/19/2016 -11 degrees Final     Calculated T Axis   Date Value Ref Range Status   07/22/2016 19 degrees Final     IONIZED CALCIUM   Date  Value Ref Range Status   02/19/2016 1.19 1.10 - 1.36 mmol/L Final     GLUCOSE, POINT OF CARE   Date Value Ref Range Status   02/06/2015 104 70 - 105 mg/dL Final     HEMOGLOBIN   Date Value Ref Range Status   02/19/2016 13.9 12.0 - 18.0 g/dL Final     OXYHEMOGLOBIN   Date Value Ref Range Status   02/19/2016 83.5 (H) 40.0 - 80.0 % Final     CARBOXYHEMOGLOBIN   Date Value Ref Range Status   02/19/2016 1.9 0.0 - 2.5 % Final     MET-HEMOGLOBIN   Date Value Ref Range Status   02/19/2016 0.1 0.0 - 3.5 % Final     BASE EXCESS   Date Value Ref Range Status   02/19/2016 0.6 -3.0 - 3.0 mmol/L Final     BICARBONATE   Date Value Ref Range Status   02/19/2016 25.0 22.0 - 26.0 mmol/L Final     %FIO2   Date Value Ref Range Status   02/19/2016 28.0 % Final     Airway:   Blood pressure 131/64, pulse 81, temperature 36 C (96.8 F), resp. rate 16, height 1.676 m ('5\' 6"'$ ), weight 102 kg (224 lb 13.9 oz),  SpO2 96 %.

## 2016-08-10 NOTE — Care Plan (Signed)
Problem: Anxiety (Adult)  Goal: Identify Related Risk Factors and Signs and Symptoms  Related risk factors and signs and symptoms are identified upon initiation of Human Response Clinical Practice Guideline (CPG)   Outcome: Ongoing (see interventions/notes)    Problem: Thought Process Alteration (Adult)  Goal: Identify Related Risk Factors and Signs and Symptoms  Related risk factors and signs and symptoms are identified upon initiation of Human Response Clinical Practice Guideline (CPG)   Outcome: Ongoing (see interventions/notes)    Problem: Perioperative Period (Adult)  Prevent and manage potential problems including:1. bleeding2. gastrointestinal complications3. hypothermia4. infection5. pain6. perioperative injury7. respiratory compromise8. situational response9. urinary retention10. venous thromboembolism11. wound complications   Goal: Signs and Symptoms of Listed Potential Problems Will be Absent or Manageable (Perioperative Period)  Signs and symptoms of listed potential problems will be absent or manageable by discharge/transition of care (reference Perioperative Period (Adult) CPG).   Outcome: Ongoing (see interventions/notes)

## 2016-08-10 NOTE — H&P (Signed)
[facility]                                                     H&P UPDATE FORM    Patient: [name]  Med Record # [id number]  Date of Birth: Apr 03, 1956  Age:  60 y.o.   Gender: female    Date of Admission: 08/10/2016    Outpatient Pre-Surgical H & P updated the day of the procedure.  1.I reviewed the H&P completed in 30 days of today's surgical procedure by Pete Pelt, MD on  07/22/2016. Assessment remains unchanged based on completion of today's re-assessment.        Change in medications: No      Last Menstrual Period: No/not applicable      Comments:     2.  Patient continues to be appropiate candidate for planned surgical procedure. YES    Courtney Paris, MD 08/10/2016, 06:38    08/10/2016  I saw and examined the patient.  I reviewed the fellow's note.  I agree with the findings and plan of care as documented in the fellow's note.  Any exceptions/additions are edited/noted.    [name], MD

## 2016-08-10 NOTE — Anesthesia Postprocedure Evaluation (Signed)
Anesthesia Post Op Evaluation    Patient: [name]  Procedure(s) Performed:Procedure(s) with comments:  EYELID RETRACTION REPAIR WITH EAR CARTILAGE GRAFT - early for labs    Last Vitals:Temperature: 36 C (96.8 F) (08/10/16 0900)  Heart Rate: 78 (08/10/16 0900)  BP (Non-Invasive): 124/68 (08/10/16 0900)  Respiratory Rate: 12 (08/10/16 0845)  SpO2-1: 95 % (08/10/16 0900)  Pain Score (Numeric, Faces): 5 (08/10/16 0900)  Patient is sufficiently recovered from the effects of anesthesia to participate in the evaluation and has returned to their pre-procedure level.  Patient location during evaluation: PACU   Post-procedure handoff checklist completed    Patient participation: complete - patient participated  Level of consciousness: awake and alert and responsive to verbal stimuli  Pain management: adequate  Airway patency: patent  Anesthetic complications: no  Cardiovascular status: acceptable  Respiratory status: acceptable  Hydration status: acceptable  Patient post-procedure temperature: Pt Normothermic   PONV Status: Absent

## 2016-08-10 NOTE — Brief Op Note (Signed)
[facility] OF OPHTHALMOLOGY   Brief-Operative Report    Patient: [name]  DOB: November 15, 1955  MRN: [id number]    08/10/2016  Pre-op Note    The patient and/or family understands the procedure with its risks and benefits, and desires to proceed as outlined in the medical record.    Courtney Paris, MD 08/10/2016, 08:21        08/10/2016  Brief operative note    The patient and/or family has been thoroughly counseled as to the indications, alternatives, and potential risks of the procedures including, but not limited to, failure of operation, need for further surgery, scar, complications from anesthesia, loss of vision, infection, bleeding, corneal abrasion.    Pre-op Dx:    1. Right facial paralysis  2. 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 Right lower eyelid paralytic ectropion    Post-op Dx:  same  Procedure:    1. Explantation of ear cartilage, right lower eyelid   2. Biopsy inflamed appearing forniceal conjunctiva  3. Ectropion repair, extensive, by lateral tarsal strip, right lower eyelid.   Surgeons:  Cindra Presume  Anesthesia: Gen, local  EBL:  < 10 cc  Complications: None    Courtney Paris, MD 08/10/2016, 08:21        08/10/2016  Discharge Note    Patient meets anesthesia discharge criteria  D/C to home  Resume any current outpatient medications  Use eye medications as instructed  RTC as scheduled    Courtney Paris, MD 08/10/2016, 08:21  Franciso Bend, MD  Fellow in Ophthalmic Plastic and Reconstructive Surgery            08/10/2016  I saw and examined the patient.  I reviewed the fellow's note.  I agree with the findings and plan of care as documented in the fellow's note.  Any exceptions/additions are edited/noted.    [name], MD

## 2016-08-10 NOTE — Care Plan (Signed)
Problem: Perioperative Period (Adult)  Prevent and manage potential problems including:1. bleeding2. gastrointestinal complications3. hypothermia4. infection5. pain6. perioperative injury7. respiratory compromise8. situational response9. urinary retention10. venous thromboembolism11. wound complications  Goal: Signs and Symptoms of Listed Potential Problems Will be Absent or Manageable (Perioperative Period)  Signs and symptoms of listed potential problems will be absent or manageable by discharge/transition of care (reference Perioperative Period (Adult) CPG).  Outcome: Ongoing (see interventions/notes)

## 2016-08-10 NOTE — OR Surgeon (Addendum)
[facility] OPHTHALMOLOGY - OPERATIVE SUMMARY    PATIENT NAME:  [name] NAME:  [id number]  DATE OF SERVICE:  08/10/2016  DATE OF BIRTH:  1956/08/22    PREOPERATIVE DIAGNOSIS:    1. Right facial paralysis  2. 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 Right lower eyelid paralytic ectropion     POSTOPERATIVE DIAGNOSIS:  same    PROCEDURE(S) PERFORMED:   1. Explantation of ear cartilage, right lower eyelid   2. Biopsy inflamed appearing forniceal conjunctiva  3. Ectropion repair, extensive, by lateral tarsal strip, right lower eyelid.     SURGEON:  [name], MD  ASSISTANT:  Franciso Bend, MD    ANESTHESIA:  General Thomes Dinning    FLUIDS GIVEN: Per Anesthesia     ESTIMATED BLOOD LOSS:  < 5 cc    TUBES/DRAINS:  None    SPECIMENS/CULTURES: right lower eyelid ear cartilage graft, right lower eyelid conjunctiva     COMPLICATIONS:  None    DESCRIPTION OF PROCEDURE:   After obtaining informed consent, the patient was taken back to the operating room and laid supine on the operating room table. The patient was placed under general anesthesia and the right eyelid was anesthetized with 2% lidocaine with epinephrine.  The patient was then prepped and draped in the usual sterile fashion.     Ear cartilage explantation of right lower eyelid: Corneal shield was placed in the right eye and a Jaffe lid speculum was placed in the upper eyelid. A 4-0 silk traction suture was placed through the gray line of the right lower eyelid. The lower eyelid was then reflected over a Desmarres retractor. Monopolar cautery was then used to make a subtarsal conjunctival incision from the punctum laterally. Toothed forceps and Westcott scissors were used to dissect inferiorly. The ear cartilage graft was then released from the surrounding tissue, excised in total, and sent to pathology for permanent sections. Dissection was then continued  to release and resect the lower eyelid retractors for the tarsus centrally. An area of the palpebral conjunctiva was noted to be scarred and abnormal in appearance, was lifted with toothed forceps, resected using Westcott scissors, and sent to pathology for permanent sections. The conjunctival incision was then closed using a running 7-0 chromic suture.     Right lower eyelid lateral tarsal strip: A lateral canthotomy and cantholysis was performed with monopolar cautery and hemostasis was achieved with bipolar cautery. A lateral tarsal strip was created at each side by removing the lateral marginal epithelium separating the anterior and posterior lamella, removing a wedge of anterior lamella and creating an infratarsal incision with Westcott scissors. The posterior conjunctiva was removed with a #15 blade. Two Freer elevators were used to expose periosteum of the lateral orbital rim at the level of Whitnall tubercle. The tarsal strip was secured to the periosteum with two 4-0 Vicryl sutures.  The orbicularis was plicated with 5-0 Vicryl suture and the skin was closed with 7-0 chromic gut suture. The Jaffe lid speculum was then removed.     The corneal shield was removed.  Ointment was placed in the eye. The patient was then taken to the recovery room in stable condition.  Please note, the attending surgeon, Dr. Kennith Center was scrubbed, present, and performed all critical portions of the procedure.     Barbara Paris, MD 08/10/2016, 19:21      I was present for all  key and/or critical portions of the case and immediately available at all times.      [name], MD 08/11/2016, 08:03          I was present for all key and/or critical portions of the case and immediately available at all times.      [name], MD 08/11/2016, 08:04

## 2016-08-10 NOTE — Discharge Instructions (Addendum)
SURGICAL DISCHARGE INSTRUCTIONS     Dr. Levonne Lapping, Anderson Malta, MD  performed your EYELID RETRACTION REPAIR WITH EAR CARTILAGE GRAFT today at the [facility]:  Monday through Friday from 6 a.m. - 7 p.m.: [phone]  Between 7 p.m. - 6 a.m., weekends and holidays:  Call [facility] at [phone] or [phone].    PLEASE SEE WRITTEN HANDOUTS AS DISCUSSED BY YOUR NURSE:      SIGNS AND SYMPTOMS OF A WOUND / INCISION INFECTION   Be sure to watch for the following:   Increase in redness or red streaks near or around the wound or incision.   Increase in pain that is intense or severe and cannot be relieved by the pain medication that your doctor has given you.   Increase in swelling that cannot be relieved by elevation of a body part, or by applying ice, if permitted.   Increase in drainage, or if yellow / green in color and smells bad. This could be on a dressing or a cast.   Increase in fever for longer than 24 hours, or an increase that is higher than 101 degrees Fahrenheit (normal body temperature is 98 degrees Fahrenheit). The incision may feel warm to the touch.    **CALL YOUR DOCTOR IF ONE OR MORE OF THESE SIGNS / SYMPTOMS SHOULD OCCUR.    ANESTHESIA INFORMATION   ANESTHESIA -- ADULT PATIENTS:  You have received intravenous sedation / general anesthesia, and you may feel drowsy and light-headed for several hours. You may even experience some forgetfulness of the procedure. DO NOT DRIVE A MOTOR VEHICLE or perform any activity requiring complete alertness or coordination until you feel fully awake in about 24-48 hours. Do not drink alcoholic beverages for at least 24 hours. Do not stay alone, you must have a responsible adult available to be with you. You may also experience a dry mouth or nausea for 24 hours. This is a normal side effect and will disappear as the effects of the medication wear off.    REMEMBER   If you experience any difficulty breathing, chest  pain, bleeding that you feel is excessive, persistent nausea or vomiting or for any other concerns:  Call your physician Dr. Levonne Lapping at [phone] or [phone]. You may also ask to have the Eye doctor on call paged. They are available to you 24 hours a day.    SPECIAL INSTRUCTIONS / COMMENTS       FOLLOW-UP APPOINTMENTS   Please call patient services at [phone] or [phone] to schedule a date / time of return. They are open Monday - Friday from 7:30 am - 5:00 pm.

## 2016-08-10 NOTE — OR PreOp (Signed)
Labs not needed per Dr. Beulah Gandy (anesthesia) and Dr. Elicia Lamp (ophthamology).

## 2016-08-10 NOTE — Care Plan (Signed)
Problem: Perioperative Period (Adult)  Prevent and manage potential problems including:1. bleeding2. gastrointestinal complications3. hypothermia4. infection5. pain6. perioperative injury7. respiratory compromise8. situational response9. urinary retention10. venous [id number]. wound complications   Goal: Signs and Symptoms of Listed Potential Problems Will be Absent or Manageable (Perioperative Period)  Signs and symptoms of listed potential problems will be absent or manageable by discharge/transition of care (reference Perioperative Period (Adult) CPG).   Outcome: Adequate for Discharge Date Met:  08/10/16

## 2016-08-10 NOTE — Anesthesia Preprocedure Evaluation (Signed)
Physical Exam:     Airway       Mallampati: III    TM distance: >3 FB    Neck ROM: full  Mouth Opening: good.      No endotracheal tube present  No Tracheostomy present    Dental           (+) upper dentures, poor dentition             Pulmonary    Breath sounds clear to auscultation  (-) no rhonchi, no decreased breath sounds, no wheezes, no rales and no stridor     Cardiovascular    Rhythm: regular  Rate: Normal  (-) no friction rub, carotid bruit is not present, no peripheral edema and no murmur     Other findings            Anesthesia Plan:  Planned anesthesia type: general    ASA 3     Intravenous induction   Anesthetic plan and risks discussed with patient.     Anesthesia issues/risks discussed are: PONV.        Patient's NPO status is appropriate for Anesthesia.           Plan discussed with CRNA.            EKG Ordered: 08/10/2016  CXR Ordered:  08/10/2016  Other Studies: labs in PAU  Consults: None    Cardiac cath 10/2012  1. Normal left ventricular function. (EF 6-65%)  2. Mild coronary artery disease.                     Pulmonary     Cardiovascular    Hypertension, well controlled, Patient denies any history of CAD/MI No peripheral edema Exercise Tolerance: good       GI/Hepatic/Renal   GERD, well controlled    Endo/Other    obesity     Neuro/Psych/MS    Cancer

## 2016-08-11 LAB — HISTORICAL SURGICAL PATHOLOGY SPECIMEN

## 2016-08-16 ENCOUNTER — Encounter (INDEPENDENT_AMBULATORY_CARE_PROVIDER_SITE_OTHER): Payer: Self-pay | Admitting: OPHTHALMOLOGY

## 2016-08-16 ENCOUNTER — Ambulatory Visit (HOSPITAL_BASED_OUTPATIENT_CLINIC_OR_DEPARTMENT_OTHER): Payer: MEDICAID | Admitting: OPHTHALMOLOGY

## 2016-08-16 ENCOUNTER — Ambulatory Visit
Admission: RE | Admit: 2016-08-16 | Discharge: 2016-08-16 | Disposition: A | Discharge status: Home / Self Care | Payer: MEDICAID | Source: Ambulatory Visit | Attending: OPHTHALMOLOGY | Admitting: OPHTHALMOLOGY

## 2016-08-16 DIAGNOSIS — Z9889 Other specified postprocedural states: Secondary | ICD-10-CM

## 2016-08-16 DIAGNOSIS — Z48810 Encounter for surgical aftercare following surgery on the sense organs: Secondary | ICD-10-CM | POA: Insufficient documentation

## 2016-08-16 DIAGNOSIS — Z87891 Personal history of nicotine dependence: Secondary | ICD-10-CM | POA: Insufficient documentation

## 2016-08-16 NOTE — Progress Notes (Addendum)
[name]  Operated by [facility]  [address]  [place] [address]  Dept: [phone]    Oculoplastics & Orbital Post-Operative Note    08/16/2016  [name],    Date of Birth:  1956/08/27    No referring provider defined for this encounter.    Ophthalmologist/Optometrist:     Past Ocular history:          MD Addition to HPI: Patient is here for pow1 of RLL ear cartilage elevation revision and RLL LTS. Patient is doing well and is pleased with the outcome of the procedure.      Impression and Plan:  1. POW 1 RLL ear cartilage elevation revision and RLL LTS  -Patient is healing well, no e/o infection  - decrease emycin to QHS for 1 week  - start warm compresses     RTC Novemeber 6th, sooner PRN  [name], MD  08/16/2016, 09:49    I have reviewed and confirmed the ROS, PFSH, and exam elements performed and documented by the technician. The scribed portion of the progress note was scribed on my behalf and at my direction. I have reviewed and attest to the accuracy of the note.   [name], MD 08/16/2016, 09:49    08/16/2016  I saw and examined the patient.  I reviewed the fellow's note.  I agree with the findings and plan of care as documented in the fellow's note.  Any exceptions/additions are edited/noted.    [name], MD              ROS     Positive for: Eyes    Negative for: Constitutional, Gastrointestinal, Neurological, Skin, Genitourinary, Musculoskeletal, HENT, Endocrine, Cardiovascular, Respiratory, Psychiatric, Allergic/Imm, Heme/Lymph    Last edited by Nelly Laurence, COA on 08/16/2016  9:08 AM. (History)            Eye Examination:     Neuro/Psych     Oriented x3:  Yes    Mood/Affect:  Normal        Visual Acuity     Visual Acuity (Snellen - Linear)      Right Left   Dist cc 20/30 20/20       Correction:  Glasses          Edited by: Nelly Laurence, COA             Not recorded         Not recorded         Not recorded          Not recorded        Pupils      APD   Right None   Left None            Not recorded               OD    OS                                                                Orbit   Exophthalmometry: OD:       OS:      Eyelids: wounds well approximated. No e/o infection       Lid edema OD: OS:  Conj chem  OD:  OS:   Lid inject OD:  OS:  Conj inject OD:  OS:              PF   MRD                       USS   LSS                 0   LAG       0       SLE  OD OS     Tear Assessment      Cornea Clear Clear     AC D&Q D&Q    Iris intact intact    Lens Clear Clear       IOP straight  Not recorded            Upgaze     5 down             Past Medical / Surgical / Family / Social History:      has a past medical history of Arthritis; Depression; GERD (gastroesophageal reflux disease); Heart murmur (1979); Hyperlipidemia LDL goal < 100 (08/29/2013); MINOR CAD (coronary artery disease) (11/10/2012); Multinodular goiter; Rheumatic fever (16); Shortness of breath; Squamous cell carcinoma (02/06/2015); Vaginal prolapse (2012); and Wears glasses. She also has no past medical history of Allergic rhinitis; Anemia; Anesthesia complication; Angina at rest [facility]); Angina pectoris ([place]); Aortic aneurysm ([place]); Asthma; Asthma; Back problem; Blood transfusion, without reported diagnosis; Breast CA ([place]); Bruises easily; Cervical cancer ([place]); Chronic bronchitis ([place]); Clotting disorder ([place]); Cognitive dysfunction; Colon cancer ([place]); Congestive heart failure, unspecified; COPD (chronic obstructive pulmonary disease) ([place]); CVA (cerebrovascular accident) ([place]); Deep vein thrombosis (DVT) ([place]); Diabetes; Diabetes mellitus ([place]); Diabetes mellitus type II; Diabetes mellitus, type 1; Diabetes mellitus, type 2 ([place]); Difficult intubation; Difficult intubation; Difficulty waking; Disorder of liver; Endometrial cancer ([place]); H/O hearing loss; H/O urinary tract infection; Hearing loss; Hepatic cirrhosis ([place]); Hepatitis A virus infection; HTN; HTN  (hypertension); breast cancer; Hypertension; Kidney disease; Loss of weight; Malignant hyperthermia; MI (myocardial infarction); Myocardial infarction; Nausea with vomiting; Neck problem; Other convulsions; Ovarian cancer ([place]); Panic attack; Personal history of irradiation; Personal history of malignant neoplasm of prostate; PONV (postoperative nausea and vomiting); Pseudocholinesterase deficiency; Renal failure; Seizure ([place]); Seizures; Sleep apnea; Spinal headache; STD (sexually transmitted disease); Stroke [facility]); Thyroid disease; Thyroid disorder; Treatment; Type 2 diabetes mellitus; Type 2 diabetes mellitus ([place]); Type I diabetes mellitus ([place]); Upper respiratory infection; Uterine cancer ([place]); Valvular disease; Viral hepatitis B; or Viral hepatitis C.    Past Surgical History:   Procedure Laterality Date   . COLONOSCOPY  01/06/09    COLONOSCOPY performed by Loletta Parish F at [place]   . GASTROSCOPY  03/05/2010    GASTROSCOPY performed by Jocelyn Lamer at [place]   . GASTROSCOPY WITH BIOPSY  03/05/2010    GASTROSCOPY WITH BIOPSY performed by Jocelyn Lamer at [facility]   . HX ADENOIDECTOMY     . HX ANKLE FRACTURE [place]  2007    left distal fibula, casted   . HX APPENDECTOMY     . HX CHOLECYSTECTOMY     . HX COLONOSCOPY     . HX CYSTOCELE REPAIR  09/24/09   . HX HAND SURGERY  2012    for Carpal tunnel    . HX OOPHORECTOMY      left ovary removed   . HX TONSILLECTOMY     .  HX TOTAL VAGINAL HYSTERECTOMY  1979   . HX WRIST FRACTURE [place]  2007    [place] injury   . PB REVISE ULNAR NERVE AT ELBOW  1979   . PB UPPER GI ENDOSCOPY,BIOPSY  12/19/07    patulous GE junction zone, erythema, nonerosive GERD           Family Medical History     Problem Relation (Age of Onset)    Bipolar Disorder Daughter    Breast Cancer Paternal Aunt, Paternal Aunt, Paternal Aunt, Paternal Aunt, Paternal 74, Other    Cancer Paternal Aunt, Paternal 43, Paternal 78, Paternal 9, Paternal 26, Other (69)    Congestive Heart Failure Father  (58)    Coronary Artery Disease Father, Mother (6)    Diabetes Mother    Heart Attack Father, Sister    High Cholesterol Mother    Hypertension Mother, Brother, Sister, Sister    Kidney Disease Sister    Leukemia Paternal Uncle, Paternal Uncle    Stroke Paternal Grandmother    Thyroid Disease Sister              Social History   Substance Use Topics   . Smoking status: Former Smoker     Packs/day: 2.00     Years: 40.00     Types: Cigarettes     Quit date: 01/22/2005   . Smokeless tobacco: Never Used   . Alcohol use No       I have seen and examined the above patient. I discussed the above diagnoses listed in the assessment and the above ophthalmic plan of care with the patient and patient's family. All questions were answered. I reviewed and, when necessary, made changes to the technician/resident note, documented ophthalmology exam, chief complaint, history of present illness, allergies, review of systems, past medical, past surgical, family and social history.            I am scribing for, and in the presence of, Dr. Kennith Center for services provided on 08/16/2016.  Chelsea Aus, [place], [place]  08/16/2016, 09:34

## 2016-08-16 NOTE — Patient Instructions (Signed)
Continue using ointment for 1 more week every night

## 2016-08-30 ENCOUNTER — Encounter (INDEPENDENT_AMBULATORY_CARE_PROVIDER_SITE_OTHER): Payer: MEDICAID | Admitting: OPHTHALMOLOGY

## 2016-09-07 ENCOUNTER — Encounter (HOSPITAL_COMMUNITY): Payer: MEDICAID | Admitting: Psychiatry

## 2016-09-22 ENCOUNTER — Encounter (INDEPENDENT_AMBULATORY_CARE_PROVIDER_SITE_OTHER): Payer: MEDICAID | Admitting: OPHTHALMOLOGY

## 2016-09-22 ENCOUNTER — Encounter (HOSPITAL_BASED_OUTPATIENT_CLINIC_OR_DEPARTMENT_OTHER): Payer: MEDICAID | Admitting: GENERAL

## 2016-10-05 ENCOUNTER — Encounter (HOSPITAL_BASED_OUTPATIENT_CLINIC_OR_DEPARTMENT_OTHER): Payer: MEDICAID | Admitting: Family Medicine

## 2016-10-22 ENCOUNTER — Other Ambulatory Visit (HOSPITAL_BASED_OUTPATIENT_CLINIC_OR_DEPARTMENT_OTHER): Payer: Self-pay | Admitting: Family Medicine

## 2016-10-22 ENCOUNTER — Other Ambulatory Visit (HOSPITAL_COMMUNITY): Payer: Self-pay | Admitting: Psychiatry

## 2016-10-22 ENCOUNTER — Encounter (HOSPITAL_COMMUNITY): Payer: Self-pay | Admitting: Psychiatry

## 2016-10-22 DIAGNOSIS — F331 Major depressive disorder, recurrent, moderate: Secondary | ICD-10-CM

## 2016-10-22 NOTE — Telephone Encounter (Signed)
From: [name]  To: [name], MD  Sent: 10/22/2016 8:48 AM EST  Subject: Medication Renewal Request    Original authorizing provider: [name], MD    Beverly Gust. Laurent would like a refill of the following medications:  pantoprazole (PROTONIX) 40 mg Oral Tablet, Delayed Release (E.C.) [name], MD]    Preferred pharmacy: [id number] - [place], [address]    Comment:      Medication renewals requested in this message routed to other providers:  FLUoxetine (PROZAC) 20 mg Oral Tablet Chales Abrahams, MD]

## 2016-10-23 ENCOUNTER — Other Ambulatory Visit (HOSPITAL_COMMUNITY): Payer: Self-pay | Admitting: Psychiatry

## 2016-10-23 DIAGNOSIS — F331 Major depressive disorder, recurrent, moderate: Secondary | ICD-10-CM

## 2016-10-23 MED ORDER — FLUOXETINE 20 MG TABLET
20.0000 mg | ORAL_TABLET | Freq: Every day | ORAL | 3 refills | Status: DC
Start: 2016-10-23 — End: 2018-01-31

## 2016-10-23 MED ORDER — PANTOPRAZOLE 40 MG TABLET,DELAYED RELEASE
40.00 mg | DELAYED_RELEASE_TABLET | Freq: Every morning | ORAL | 11 refills | Status: DC
Start: 2016-10-23 — End: 2018-02-23

## 2016-10-23 NOTE — Telephone Encounter (Signed)
Pt requesting refill of Prozac 20 mg tablets. Refill sent to preferred pharmacy.    Chales Abrahams, MD  10/23/2016, 19:50

## 2016-10-23 NOTE — Telephone Encounter (Signed)
From: [name]  To: Chales Abrahams, MD  Sent: 10/22/2016 8:48 AM EST  Subject: Medication Renewal Request    Original authorizing provider: Chales Abrahams, MD    [name] would like a refill of the following medications:  FLUoxetine (PROZAC) 20 mg Oral Tablet Chales Abrahams, MD]    Preferred pharmacy: [id number] - [place], [address]    Comment:      Medication renewals requested in this message routed to other providers:  pantoprazole (PROTONIX) 40 mg Oral Tablet, Delayed Release (E.C.) [name], MD]

## 2016-12-07 ENCOUNTER — Encounter (HOSPITAL_BASED_OUTPATIENT_CLINIC_OR_DEPARTMENT_OTHER): Payer: MEDICAID | Admitting: Family Medicine

## 2016-12-28 ENCOUNTER — Other Ambulatory Visit: Payer: Self-pay | Admitting: Family Medicine

## 2016-12-28 DIAGNOSIS — Z1231 Encounter for screening mammogram for malignant neoplasm of breast: Secondary | ICD-10-CM

## 2017-01-26 ENCOUNTER — Encounter (HOSPITAL_BASED_OUTPATIENT_CLINIC_OR_DEPARTMENT_OTHER): Payer: MEDICAID | Admitting: Student in an Organized Health Care Education/Training Program

## 2017-01-28 ENCOUNTER — Ambulatory Visit
Admission: RE | Admit: 2017-01-28 | Discharge: 2017-01-28 | Disposition: A | Discharge status: Home / Self Care | Payer: Medicaid Other | Source: Ambulatory Visit | Attending: Family Medicine | Admitting: Family Medicine

## 2017-01-28 ENCOUNTER — Encounter: Payer: Self-pay | Admitting: Radiology

## 2017-01-28 DIAGNOSIS — Z1231 Encounter for screening mammogram for malignant neoplasm of breast: Secondary | ICD-10-CM | POA: Insufficient documentation

## 2017-02-11 ENCOUNTER — Emergency Department
Admission: EM | Admit: 2017-02-11 | Discharge: 2017-02-11 | Disposition: A | Discharge status: Home / Self Care | Payer: Medicaid Other | Attending: Emergency Medicine | Admitting: Emergency Medicine

## 2017-02-11 ENCOUNTER — Encounter: Payer: Self-pay | Admitting: *Deleted

## 2017-02-11 ENCOUNTER — Emergency Department: Payer: Medicaid Other

## 2017-02-11 DIAGNOSIS — I10 Essential (primary) hypertension: Secondary | ICD-10-CM | POA: Diagnosis not present

## 2017-02-11 DIAGNOSIS — Z7982 Long term (current) use of aspirin: Secondary | ICD-10-CM | POA: Insufficient documentation

## 2017-02-11 DIAGNOSIS — R071 Chest pain on breathing: Secondary | ICD-10-CM | POA: Insufficient documentation

## 2017-02-11 DIAGNOSIS — Z87891 Personal history of nicotine dependence: Secondary | ICD-10-CM | POA: Insufficient documentation

## 2017-02-11 DIAGNOSIS — Z79899 Other long term (current) drug therapy: Secondary | ICD-10-CM | POA: Diagnosis not present

## 2017-02-11 DIAGNOSIS — R079 Chest pain, unspecified: Secondary | ICD-10-CM

## 2017-02-11 HISTORY — DX: Malignant (primary) neoplasm, unspecified: C80.1

## 2017-02-11 HISTORY — DX: Major depressive disorder, single episode, unspecified: F32.9

## 2017-02-11 HISTORY — DX: Essential (primary) hypertension: I10

## 2017-02-11 HISTORY — DX: Depression, unspecified: F32.A

## 2017-02-11 LAB — COMPREHENSIVE METABOLIC PANEL
ALBUMIN: 3.7 g/dL (ref 3.5–5.0)
ALT: 22 U/L (ref 14–54)
ANION GAP: 8 (ref 5–15)
AST: 32 U/L (ref 15–41)
Alkaline Phosphatase: 43 U/L (ref 38–126)
BILIRUBIN TOTAL: 1.2 mg/dL (ref 0.3–1.2)
BUN: 17 mg/dL (ref 6–20)
CHLORIDE: 105 mmol/L (ref 101–111)
CO2: 24 mmol/L (ref 22–32)
Calcium: 8.8 mg/dL — ABNORMAL LOW (ref 8.9–10.3)
Creatinine, Ser: 0.97 mg/dL (ref 0.44–1.00)
GFR calc Af Amer: >60 mL/min (ref >60)
GFR calc non Af Amer: >60 mL/min (ref >60)
GLUCOSE: 94 mg/dL (ref 65–99)
POTASSIUM: 3.6 mmol/L (ref 3.5–5.1)
SODIUM: 137 mmol/L (ref 135–145)
TOTAL PROTEIN: 7 g/dL (ref 6.5–8.1)

## 2017-02-11 LAB — CBC
HEMATOCRIT: 38.2 % (ref 35.0–47.0)
Hemoglobin: 13.1 g/dL (ref 12.0–16.0)
MCH: 28.5 pg (ref 26.0–34.0)
MCHC: 34.2 g/dL (ref 32.0–36.0)
MCV: 83.5 fL (ref 80.0–100.0)
Platelets: 374 10*3/uL (ref 150–440)
RBC: 4.58 MIL/uL (ref 3.80–5.20)
RDW: 14.5 % (ref 11.5–14.5)
WBC: 7.1 10*3/uL (ref 3.6–11.0)

## 2017-02-11 LAB — FIBRIN DERIVATIVES D-DIMER (ARMC ONLY): FIBRIN DERIVATIVES D-DIMER (ARMC): 460.16 (ref 0.00–499.00)

## 2017-02-11 LAB — TROPONIN I
Troponin I: <0.03 ng/mL (ref <0.03)
Troponin I: <0.03 ng/mL (ref <0.03)

## 2017-02-11 MED ORDER — MORPHINE SULFATE (PF) 4 MG/ML IV SOLN
4.0000 mg | Freq: Once | INTRAVENOUS | Status: AC
  Administered 2017-02-11: 4 mg via INTRAVENOUS
  Filled 2017-02-11: qty 1

## 2017-02-11 MED ORDER — TRAMADOL HCL 50 MG PO TABS: 50.0000 mg | ORAL_TABLET | Freq: Four times a day (QID) | ORAL | 0 refills | Status: AC | PRN

## 2017-02-11 NOTE — ED Notes (Signed)
Unsuccessful IV attempts x2.

## 2017-02-11 NOTE — ED Provider Notes (Signed)
[facility] Emergency Department Provider Note       Time seen: ----------------------------------------- 9:46 AM on 02/11/2017 -----------------------------------------    I have reviewed the triage vital signs and the nursing notes.   HISTORY   Chief Complaint Chest Pain    HPI Selena Duncan is a 61 y.o. female who presents to the ED for chest pain. Patient states it is worse with deep breath or movement. Patient reports a history of chest pain in the past but had a negative workup in [place] where she is from. She denies fevers, chills, cough or other complaints. She does have associated shortness of breath. She is not sure if it could be related to lifting that she has done recently or not.   Past Medical History:  Diagnosis Date  . Cancer ([place])   . Depression   . Hypertension     There are no active problems to display for this patient.   No past surgical history on file.  Allergies Blue dyes (parenteral); Codeine; Lipitor [atorvastatin]; Mobic [meloxicam]; Naprosyn [naproxen]; Nickel; and Cymbalta [duloxetine hcl]  Social History Social History  Substance Use Topics  . Smoking status: Former Research scientist (life sciences)  . Smokeless tobacco: Not on file  . Alcohol use No    Review of Systems Constitutional: Negative for fever. Cardiovascular: Positive for chest pain Respiratory: No shortness of breath Gastrointestinal: Negative for abdominal pain, vomiting and diarrhea. Genitourinary: Negative for dysuria. Musculoskeletal: Negative for back pain. Skin: Negative for rash. Neurological: Negative for headaches, focal weakness or numbness.  10-point ROS otherwise negative.  ____________________________________________   PHYSICAL EXAM:  VITAL SIGNS: ED Triage Vitals  Enc Vitals Group     BP 02/11/17 0936 (!) 149/76     Pulse Rate 02/11/17 0936 70     Resp 02/11/17 0936 20     Temp 02/11/17 0936 98 F (36.7 C)     Temp Source 02/11/17  0936 Oral     SpO2 02/11/17 0936 98 %     Weight 02/11/17 0937 216 lb (98 kg)     Height 02/11/17 0937 5\' 5"  (1.651 m)     Head Circumference --      Peak Flow --      Pain Score 02/11/17 0936 10     Pain Loc --      Pain Edu? --      Excl. in [place]? --     Constitutional: Alert and oriented. Mild distress Eyes: Conjunctivae are normal. PERRL. Normal extraocular movements. ENT   Head: Normocephalic and atraumatic.   Nose: No congestion/rhinnorhea.   Mouth/Throat: Mucous membranes are moist.   Neck: No stridor. Cardiovascular: Normal rate, regular rhythm. No murmurs, rubs, or gallops. Respiratory: Normal respiratory effort without tachypnea nor retractions. Breath sounds are clear and equal bilaterally. No wheezes/rales/rhonchi. Gastrointestinal: Soft and nontender. Normal bowel sounds Musculoskeletal: Nontender with normal range of motion in extremities. No lower extremity tenderness nor edema. Neurologic:  Normal speech and language. No gross focal neurologic deficits are appreciated.  Skin:  Skin is warm, dry and intact. No rash noted. Psychiatric: Mood and affect are normal. Speech and behavior are normal.  ____________________________________________  EKG: Interpreted by me. Sinus bradycardia, low voltage QRS, flat T waves, normal axis  ____________________________________________  ED COURSE:  Pertinent labs & imaging results that were available during my care of the patient were reviewed by me and considered in my medical decision making (see chart for details). Patient presents for chest pain, sob, we will assess  with labs and imaging as indicated.   Procedures ____________________________________________   LABS (pertinent positives/negatives)  Labs Reviewed  COMPREHENSIVE METABOLIC PANEL - Abnormal; Notable for the following:       Result Value   Calcium 8.8 (*)    All other components within normal limits  CBC  TROPONIN I  FIBRIN DERIVATIVES D-DIMER  ([facility] ONLY)  TROPONIN I    RADIOLOGY Images were viewed by me  CXR  IMPRESSION: No active disease. ____________________________________________  FINAL ASSESSMENT AND PLAN  Chest pain  Plan: Patient's labs and imaging were dictated above. Patient had presented for chest pain of uncertain etiology. She is low risk for ACS according to her heart score. Repeat troponin and d-dimer were negative. She'll be discharged with pain medication and is stable for outpatient cardiology follow-up.   Earleen Newport, MD   Note: This note was generated in part or whole with voice recognition software. Voice recognition is usually quite accurate but there are transcription errors that can and very often do occur. I apologize for any typographical errors that were not detected and corrected.     Earleen Newport, MD 02/11/17 1259

## 2017-02-11 NOTE — ED Notes (Signed)
Signature pad not working.  Patient verbalized understanding of discharge instructions and has no further questions. 

## 2017-02-11 NOTE — ED Triage Notes (Signed)
Pt was lifting crates in house yesterday, pt starting experiencing chest pain which increasing with movement and shortness of breath

## 2017-02-15 ENCOUNTER — Other Ambulatory Visit: Payer: Self-pay | Admitting: *Deleted

## 2017-02-15 ENCOUNTER — Inpatient Hospital Stay
Admission: RE | Admit: 2017-02-15 | Discharge: 2017-02-15 | Disposition: A | Discharge status: Home / Self Care | Payer: Self-pay | Source: Ambulatory Visit | Attending: *Deleted | Admitting: *Deleted

## 2017-02-15 DIAGNOSIS — Z9289 Personal history of other medical treatment: Secondary | ICD-10-CM

## 2017-02-16 ENCOUNTER — Other Ambulatory Visit: Payer: Self-pay

## 2017-02-16 ENCOUNTER — Ambulatory Visit (INDEPENDENT_AMBULATORY_CARE_PROVIDER_SITE_OTHER): Payer: Medicaid Other | Admitting: Internal Medicine

## 2017-02-16 ENCOUNTER — Ambulatory Visit (INDEPENDENT_AMBULATORY_CARE_PROVIDER_SITE_OTHER): Payer: Medicaid Other

## 2017-02-16 ENCOUNTER — Encounter: Payer: Self-pay | Admitting: Internal Medicine

## 2017-02-16 VITALS — BP 114/74 | HR 90 | Ht 65.0 in | Wt 218.2 lb

## 2017-02-16 DIAGNOSIS — I2510 Atherosclerotic heart disease of native coronary artery without angina pectoris: Secondary | ICD-10-CM | POA: Insufficient documentation

## 2017-02-16 DIAGNOSIS — R0789 Other chest pain: Secondary | ICD-10-CM | POA: Diagnosis not present

## 2017-02-16 DIAGNOSIS — I10 Essential (primary) hypertension: Secondary | ICD-10-CM | POA: Insufficient documentation

## 2017-02-16 DIAGNOSIS — Z8679 Personal history of other diseases of the circulatory system: Secondary | ICD-10-CM | POA: Insufficient documentation

## 2017-02-16 DIAGNOSIS — Z01818 Encounter for other preprocedural examination: Secondary | ICD-10-CM

## 2017-02-16 HISTORY — DX: Essential (primary) hypertension: I10

## 2017-02-16 LAB — ECHOCARDIOGRAM COMPLETE
Height: 65 in
Weight: 3492 oz

## 2017-02-16 MED ORDER — NAPROXEN 250 MG PO TABS: 250.0000 mg | ORAL_TABLET | Freq: Two times a day (BID) | ORAL | Status: AC | PRN

## 2017-02-16 NOTE — Progress Notes (Signed)
New Outpatient Visit Date: 02/16/2017  Referring Provider: Lenise Arena, ED [facility] Emergency Department  Chief Complaint: Chest pain  HPI:  Selena Duncan is a 61 y.o. female who is being seen today for the evaluation of chest pain at the request of Dr. Jimmye Norman. She has a history of coronary artery disease, hypertension, depression, and head and neck cancer. She presented to the [facility] emergency department on 02/11/17 complaining of chest pain worsened with deep inspiration and movement. Workup in the ED was notable for negative troponin I 2 and negative d-dimer. EKG showed low voltage in the precordial leads with question of poor R-wave progression. She describes her pain as severe stabbing discomfort under the sternum radiating to the back. It initially began when she was lying in bed after having done heavy lifting the prior day. The pain was worsened significantly with deep inspiration but was not positional. It is maximal intensity was 10/10. It has intermittently been associated with diaphoresis, though the patient notes a long history of hot flashes. She has been applying warm compresses and taking tramadol with significant improvement in the discomfort. He continues to be present almost all the time with an intensity of 4-5/10. It is hard to know if it worsened significantly with activity. She denies shortness of breath, palpitations, lightheadedness, orthopnea, PND, and leg edema (though she has experienced leg swelling when it is hot in the summer).  Selena Duncan reports undergoing previous cardiovascular evaluation at [facility] in [place], [place] in the last 1-2 years. She believes stress test and cardiac catheterization were performed there due to shortness of breath. She reports nonobstructive coronary artery disease (possible 30% blockage) being found on catheterization. She also reports a history of rheumatic fever while pregnant with her first child in the 5s. She does  not recall specifics of this diagnosis nor does she remember if she was treated with an extended course of antibiotics. She does not recount having an echocardiogram performed recently.  --------------------------------------------------------------------------------------------------  Cardiovascular History & Procedures: Cardiovascular Problems:  Nonobstructive coronary artery disease with atypical chest pain  Question rheumatic fever  Risk Factors:  Known coronary artery disease, hypertension, family history, and obesity  Cath/PCI:  Cardiac catheterization in [place], [place], 1-2 years ago with nonobstructive CAD per patient report  CV Surgery:  None  EP Procedures and Devices:  None  Non-Invasive Evaluation(s):  None available  Recent CV Pertinent Labs: Lab Results  Component Value Date   K 3.6 02/11/2017   BUN 17 02/11/2017   CREATININE 0.97 02/11/2017    --------------------------------------------------------------------------------------------------  Past Medical History:  Diagnosis Date  . Cancer ([place])   . Coronary artery disease   . Depression   . Hyperlipidemia   . Hypertension   . Thyroid goiter     Past Surgical History:  Procedure Laterality Date  . [place]    Outpatient Encounter Prescriptions as of 02/16/2017  Medication Sig  . aspirin 81 MG chewable tablet Chew 81 mg by mouth daily.  Marland Kitchen azelaic acid (AZELEX) 20 % cream Apply 1 application topically 2 (two) times daily. After skin is thoroughly washed and patted dry, gently but thoroughly massage a thin film of azelaic acid cream into the affected area twice daily, in the morning and evening.  . clobetasol ointment (TEMOVATE) 4.09 % Apply 1 application topically 2 (two) times daily.  Marland Kitchen FLUoxetine (PROZAC) 20 MG tablet Take 20 mg by mouth daily.  Marland Kitchen gabapentin (NEURONTIN) 300 MG capsule  Take 600 mg by mouth 3 (three) times daily.  . halobetasol  (ULTRAVATE) 0.05 % cream Apply 1 application topically 3 (three) times a week.  . hydrochlorothiazide (HYDRODIURIL) 12.5 MG tablet Take 12.5 mg by mouth daily.  . metroNIDAZOLE (METROCREAM) 0.75 % cream Apply 1 application topically 2 (two) times daily.  . mineral oil-hydrophilic petrolatum (AQUAPHOR) ointment Apply 1 application topically 4 (four) times daily as needed for dry skin.  Marland Kitchen neomycin-polymyxin-dexameth (MAXITROL) 0.1 % OINT Place 1 application into the right eye 4 (four) times daily.  . nitroGLYCERIN (NITROSTAT) 0.4 MG SL tablet Place 0.4 mg under the tongue every 5 (five) minutes as needed for chest pain.  Marland Kitchen nystatin (MYCOSTATIN/NYSTOP) powder Apply 1 g topically 2 (two) times daily.  . pantoprazole (PROTONIX) 40 MG tablet Take 40 mg by mouth daily.  . ranitidine (ZANTAC) 15 MG/ML syrup Take 150 mg by mouth at bedtime.  . traMADol (ULTRAM) 50 MG tablet Take 1 tablet (50 mg total) by mouth every 6 (six) hours as needed.  . triamcinolone ointment (KENALOG) 0.1 % Apply 1 application topically 2 (two) times daily.   No facility-administered encounter medications on file as of 02/16/2017.     Allergies: Blue dyes (parenteral); Codeine; Lipitor [atorvastatin]; Mobic [meloxicam]; Naprosyn [naproxen]; Nickel; and Cymbalta [duloxetine hcl]  Social History   Social History  . Marital status: Divorced    Spouse name: N/A  . Number of children: N/A  . Years of education: N/A   Occupational History  . Not on file.   Social History Main Topics  . Smoking status: Former Smoker    Years: 30.00    Quit date: 2006  . Smokeless tobacco: Never Used  . Alcohol use No  . Drug use: No  . Sexual activity: Not on file   Other Topics Concern  . Not on file   Social History Narrative  . No narrative on file    Family History  Problem Relation Age of Onset  . Breast cancer Paternal Aunt   . Breast cancer Cousin     paternal cousin  . Heart disease Mother   . Heart failure Father     . Heart attack Sister     Review of Systems: A 12-system review of systems was performed and was negative except as noted in the HPI.  --------------------------------------------------------------------------------------------------  Physical Exam: BP 114/74 (BP Location: Right Arm, Patient Position: Sitting, Cuff Size: Normal)   Pulse 90   Ht 5\' 5"  (1.651 m)   Wt 218 lb 4 oz (99 kg)   BMI 36.32 kg/m   General:  Obese woman, seated comfortably in exam room. HEENT: No conjunctival pallor or scleral icterus.  Moist mucous membranes.  OP clear. Neck: Supple without lymphadenopathy, thyromegaly, JVD, or HJR.  No carotid bruit. Lungs: Normal work of breathing.  Clear to auscultation bilaterally without wheezes or crackles. Heart: Distant heart sounds. Regular rate and rhythm without murmurs, rubs, or gallops. Unable to assess PMI due to body habitus. No chest wall tenderness to palpation. Abd: Bowel sounds present.  Soft, NT/ND without hepatosplenomegaly Ext: No lower extremity edema.  Radial, PT, and DP pulses are 2+ bilaterally Skin: warm and dry without rash Neuro: CNIII-XII intact.  Strength and fine-touch sensation intact in upper and lower extremities bilaterally. Psych: Normal mood and affect.  EKG:  Normal sinus rhythm with low voltage. Question poor R-wave progression, though low voltage limits evaluation. No significant change from prior tracings on 02/11/17 (I have personally reviewed both tracings).  Lab Results  Component Value Date   WBC 7.1 02/11/2017   HGB 13.1 02/11/2017   HCT 38.2 02/11/2017   MCV 83.5 02/11/2017   PLT 374 02/11/2017    Lab Results  Component Value Date   NA 137 02/11/2017   K 3.6 02/11/2017   CL 105 02/11/2017   CO2 24 02/11/2017   BUN 17 02/11/2017   CREATININE 0.97 02/11/2017   GLUCOSE 94 02/11/2017   ALT 22 02/11/2017    No results found for: CHOL, HDL, LDLCALC, LDLDIRECT, TRIG,  CHOLHDL  --------------------------------------------------------------------------------------------------  ASSESSMENT AND PLAN: Atypical chest pain with history of non-obstructive coronary artery disease Pain began after heavy lifting and is worsened with deep inspiration. This is most likely muscular skeletal in origin. She has several cardiac risk factors, though reported cath demonstrating nonobstructive CAD in the last year to is reassuring. We have discussed further evaluation options and have agreed to a myocardial perfusion stress test. We will also request records from [place] regarding previous workup. EKG demonstrates low voltage in the precordial leads with question of poor R-wave progression. Patient can try naproxen as needed for likely musculoskeletal chest pain.  History of rheumatic fever No evidence of heart failure on exam today. I do not appreciate any murmurs on exam today, though body habitus limits evaluation. We will obtain a transthoracic echocardiogram for further evaluation.  Hypertension: Blood pressure is well controlled today. No medication changes at this time.  Follow-up: Return to clinic in one month.  Nelva Bush, MD 02/16/2017 8:13 AM

## 2017-02-16 NOTE — Patient Instructions (Addendum)
Medication Instructions:  Your physician has recommended you make the following change in your medication:  1- TAKE Naproxen 250mg  by mouth two times a day as needed. Over the counter. Follow instructions on bottle.   Labwork: none  Testing/Procedures: Your physician has requested that you have an echocardiogram. Echocardiography is a painless test that uses sound waves to create images of your heart. It provides your doctor with information about the size and shape of your heart and how well your heart's chambers and valves are working. This procedure takes approximately one hour. There are no restrictions for this procedure. BEFORE FOLLOW UP APPT  Your physician has requested that you have a lexiscan myoview. For further information please visit [email or web address]. Please follow instruction sheet, as given.  [place]  Your caregiver has ordered a Stress Test with nuclear imaging. The purpose of this test is to evaluate the blood supply to your heart muscle. This procedure is referred to as a "Non-Invasive Stress Test." This is because other than having an IV started in your vein, nothing is inserted or "invades" your body. Cardiac stress tests are done to find areas of poor blood flow to the heart by determining the extent of coronary artery disease (CAD). Some patients exercise on a treadmill, which naturally increases the blood flow to your heart, while others who are  unable to walk on a treadmill due to physical limitations have a pharmacologic/chemical stress agent called Lexiscan . This medicine will mimic walking on a treadmill by temporarily increasing your coronary blood flow.   Please note: these test may take anywhere between 2-4 hours to complete  PLEASE REPORT TO [place] AT THE FIRST DESK WILL DIRECT YOU WHERE TO GO  Date of Procedure:_____05/02/18________  Arrival Time for Procedure:______08:45 am______  Instructions regarding  medication:    _X__:  Hold other medications as follows:___HYDROCHLOROTHIAZIDE (HCTZ)   PLEASE NOTIFY THE OFFICE AT LEAST 24 HOURS IN ADVANCE IF YOU ARE UNABLE TO KEEP YOUR APPOINTMENT.  [phone] AND  PLEASE NOTIFY NUCLEAR MEDICINE AT [facility] AT LEAST 24 HOURS IN ADVANCE IF YOU ARE UNABLE TO KEEP YOUR APPOINTMENT. [phone]  How to prepare for your Myoview test:  1. Do not eat or drink after midnight 2. No caffeine for 24 hours prior to test 3. No smoking 24 hours prior to test. 4. Your medication may be taken with water.  If your doctor stopped a medication because of this test, do not take that medication. 5. Ladies, please do not wear dresses.  Skirts or pants are appropriate. Please wear a short sleeve shirt. 6. No perfume, cologne or lotion. 7. Wear comfortable walking shoes. No heels!       Follow-Up: Your physician recommends that you schedule a follow-up appointment in: [place].    Cardiac Nuclear Scan A cardiac nuclear scan is a test that measures blood flow to the heart when a person is resting and when he or she is exercising. The test looks for problems such as:  Not enough blood reaching a portion of the heart.  The heart muscle not working normally. You may need this test if:  You have heart disease.  You have had abnormal lab results.  You have had heart surgery or angioplasty.  You have chest pain.  You have shortness of breath. In this test, a radioactive dye (tracer) is injected into your bloodstream. After the tracer has traveled to your heart, an imaging device is  used to measure how much of the tracer is absorbed by or distributed to various areas of your heart. This procedure is usually done at a hospital and takes 2-4 hours. Tell a health care provider about:  Any allergies you have.  All medicines you are taking, including vitamins, herbs, eye drops, creams, and over-the-counter medicines.  Any problems you or family members  have had with the use of anesthetic medicines.  Any blood disorders you have.  Any surgeries you have had.  Any medical conditions you have.  Whether you are pregnant or may be pregnant. What are the risks? Generally, this is a safe procedure. However, problems may occur, including:  Serious chest pain and heart attack. This is only a risk if the stress portion of the test is done.  Rapid heartbeat.  Sensation of warmth in your chest. This usually passes quickly. What happens before the procedure?  Ask your health care provider about changing or stopping your regular medicines. This is especially important if you are taking diabetes medicines or blood thinners.  Remove your jewelry on the day of the procedure. What happens during the procedure?  An IV tube will be inserted into one of your veins.  Your health care provider will inject a small amount of radioactive tracer through the tube.  You will wait for 20-40 minutes while the tracer travels through your bloodstream.  Your heart activity will be monitored with an electrocardiogram (ECG).  You will lie down on an exam table.  Images of your heart will be taken for about 15-20 minutes.  You may be asked to exercise on a treadmill or stationary bike. While you exercise, your heart's activity will be monitored with an ECG, and your blood pressure will be checked. If you are unable to exercise, you may be given a medicine to increase blood flow to parts of your heart.  When blood flow to your heart has peaked, a tracer will again be injected through the IV tube.  After 20-40 minutes, you will get back on the exam table and have more images taken of your heart.  When the procedure is over, your IV tube will be removed. The procedure may vary among health care providers and hospitals. Depending on the type of tracer used, scans may need to be repeated 3-4 hours later. What happens after the procedure?  Unless your health  care provider tells you otherwise, you may return to your normal schedule, including diet, activities, and medicines.  Unless your health care provider tells you otherwise, you may increase your fluid intake. This will help flush the contrast dye from your body. Drink enough fluid to keep your urine clear or pale yellow.  It is up to you to get your test results. Ask your health care provider, or the department that is doing the test, when your results will be ready. Summary  A cardiac nuclear scan measures the blood flow to the heart when a person is resting and when he or she is exercising.  You may need this test if you are at risk for heart disease.  Tell your health care provider if you are pregnant.  Unless your health care provider tells you otherwise, increase your fluid intake. This will help flush the contrast dye from your body. Drink enough fluid to keep your urine clear or pale yellow. This information is not intended to replace advice given to you by your health care provider. Make sure you discuss any questions you have  with your health care provider. Document Released: 11/05/2004 Document Revised: 10/13/2016 Document Reviewed: 09/19/2013 Elsevier Interactive Patient Education  2017 [employer].     Echocardiogram An echocardiogram, or echocardiography, uses sound waves (ultrasound) to produce an image of your heart. The echocardiogram is simple, painless, obtained within a short period of time, and offers valuable information to your health care provider. The images from an echocardiogram can provide information such as:  Evidence of coronary artery disease (CAD).  Heart size.  Heart muscle function.  Heart valve function.  Aneurysm detection.  Evidence of a past heart attack.  Fluid buildup around the heart.  Heart muscle thickening.  Assess heart valve function. Tell a health care provider about:  Any allergies you have.  All medicines you are taking,  including vitamins, herbs, eye drops, creams, and over-the-counter medicines.  Any problems you or family members have had with anesthetic medicines.  Any blood disorders you have.  Any surgeries you have had.  Any medical conditions you have.  Whether you are pregnant or may be pregnant. What happens before the procedure? No special preparation is needed. Eat and drink normally. What happens during the procedure?  In order to produce an image of your heart, gel will be applied to your chest and a wand-like tool (transducer) will be moved over your chest. The gel will help transmit the sound waves from the transducer. The sound waves will harmlessly bounce off your heart to allow the heart images to be captured in real-time motion. These images will then be recorded.  You may need an IV to receive a medicine that improves the quality of the pictures. What happens after the procedure? You may return to your normal schedule including diet, activities, and medicines, unless your health care provider tells you otherwise. This information is not intended to replace advice given to you by your health care provider. Make sure you discuss any questions you have with your health care provider. Document Released: 10/08/2000 Document Revised: 05/29/2016 Document Reviewed: 06/18/2013 Elsevier Interactive Patient Education  2017 [employer].

## 2017-02-18 ENCOUNTER — Other Ambulatory Visit (HOSPITAL_COMMUNITY): Payer: Self-pay

## 2017-02-22 ENCOUNTER — Other Ambulatory Visit: Payer: Medicaid Other

## 2017-02-22 DIAGNOSIS — F4310 Post-traumatic stress disorder, unspecified: Secondary | ICD-10-CM

## 2017-02-22 HISTORY — DX: Post-traumatic stress disorder, unspecified: F43.10

## 2017-02-23 ENCOUNTER — Encounter
Admission: RE | Admit: 2017-02-23 | Discharge: 2017-02-23 | Disposition: A | Discharge status: Home / Self Care | Payer: Medicaid Other | Source: Ambulatory Visit | Attending: Internal Medicine | Admitting: Internal Medicine

## 2017-02-23 DIAGNOSIS — R0789 Other chest pain: Secondary | ICD-10-CM | POA: Diagnosis not present

## 2017-02-23 LAB — NM MYOCAR MULTI W/SPECT W/WALL MOTION / EF
CHL CUP NUCLEAR SDS: 1
CHL CUP NUCLEAR SSS: 1
CHL CUP RESTING HR STRESS: 53 {beats}/min
LV dias vol: 73 mL (ref 46–106)
LV sys vol: 26 mL
NUC STRESS TID: 0.93
Peak HR: 96 {beats}/min
Percent HR: 59 %
SRS: 2

## 2017-02-23 MED ORDER — TECHNETIUM TC 99M TETROFOSMIN IV KIT
12.9490 | PACK | Freq: Once | INTRAVENOUS | Status: AC | PRN
  Administered 2017-02-23: 12.949 via INTRAVENOUS

## 2017-02-23 MED ORDER — TECHNETIUM TC 99M TETROFOSMIN IV KIT
28.4160 | PACK | Freq: Once | INTRAVENOUS | Status: AC | PRN
  Administered 2017-02-23: 28.416 via INTRAVENOUS

## 2017-02-23 MED ORDER — REGADENOSON 0.4 MG/5ML IV SOLN
0.4000 mg | Freq: Once | INTRAVENOUS | Status: AC
  Administered 2017-02-23: 0.4 mg via INTRAVENOUS

## 2017-02-24 ENCOUNTER — Telehealth: Payer: Self-pay | Admitting: Internal Medicine

## 2017-02-24 NOTE — Telephone Encounter (Signed)
Based on low risk nuclear stress test and transthoracic echo, it is reasonable for her to proceed with dental procedures, including those that utilize local anesthesia with lidocaine and/or sedation.  Yvonne Kendall, MD [facility] Pager: [phone]

## 2017-02-24 NOTE — Telephone Encounter (Signed)
Please send clearance for dental extraction   [phone] tele [phone] fax  [facility]

## 2017-02-24 NOTE — Telephone Encounter (Signed)
Routed to fax # provided. 

## 2017-02-24 NOTE — Telephone Encounter (Signed)
Spoke with April at [facility]. The dentist wants to receive clearance that it is ok to perform possible multiple dental extractions using local anesthetic or sedation such as lidocaine or other agents if needed.  Routing to Dr End for clearance.

## 2017-02-24 NOTE — Telephone Encounter (Signed)
Based on low risk nuclear stress test and transthoracic echo, it is reasonable for her to proceed with dental procedures, including those that utilize local anesthesia with lidocaine and/or sedation.  Nelva Bush, MD [facility] Pager: [phone]

## 2017-03-15 ENCOUNTER — Encounter: Payer: Self-pay | Admitting: Nurse Practitioner

## 2017-03-15 ENCOUNTER — Ambulatory Visit (INDEPENDENT_AMBULATORY_CARE_PROVIDER_SITE_OTHER): Payer: Medicaid Other | Admitting: Nurse Practitioner

## 2017-03-15 VITALS — BP 116/76 | HR 80 | Ht 65.0 in | Wt 216.0 lb

## 2017-03-15 DIAGNOSIS — R072 Precordial pain: Secondary | ICD-10-CM | POA: Diagnosis not present

## 2017-03-15 DIAGNOSIS — I10 Essential (primary) hypertension: Secondary | ICD-10-CM

## 2017-03-15 NOTE — Patient Instructions (Signed)
Follow-Up: Your physician recommends that you schedule a follow-up appointment as needed.   It was a pleasure seeing you today here in the office. Please do not hesitate to give us a call back if you have any further questions. [phone]  Sula Fetterly A. RN, BSN    

## 2017-03-15 NOTE — Progress Notes (Signed)
Office Visit    Patient Name: Phaedra Colgate Date of Encounter: 03/15/2017  Primary Care Provider:  Gennette Pac, FNP Primary Cardiologist:  Andree Coss, MD   Chief Complaint    61 year old female with a prior history of chest pain, nonobstructive coronary artery disease, hypertension, hyperlipidemia, GERD, depression, and PTSD, who presents for follow-up related to recent bouts of chest pain and normal stress test.  Past Medical History    Past Medical History:  Diagnosis Date  . Arthritis   . 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 Depression   . Diastolic dysfunction    a. 01/2017 Echo: Ef 60-65%, Gr1 DD, no rwma.  Marland Kitchen GERD (gastroesophageal reflux disease)   . Hyperlipidemia   . Hypertension   . Major depressive disorder   . Multinodular goiter   . Non-obstructive Coronary artery disease    a. Stress test & Cath @ [facility] in [place], [place] ~ 2016-->Cath reportedly showed nonobs dzs;  b.02/2017 MV: EF 65%, no ischemia/infarct.  Marland Kitchen PTSD (post-traumatic stress disorder) 02/2017  . Rheumatic fever   . Thyroid goiter    Past Surgical History:  Procedure Laterality Date  . CARDIAC CATHETERIZATION     [place]    Allergies  Allergies  Allergen Reactions  . Blue Dyes (Parenteral) Other (See Comments)    blisters  . Codeine Itching  . Lipitor [Atorvastatin]   . Mobic [Meloxicam] Nausea And Vomiting  . Naprosyn [Naproxen]   . Nickel   . Cymbalta [Duloxetine Hcl] Rash    History of Present Illness    61 year old female with the above past medical history including chest pain status post prior cardiac workup in [place], [place] approximately 1-2 years ago, including stress testing and catheterization with apparent nonobstructive CAD. Other history includes GERD, depression, PTSD, remote tobacco abuse, and rheumatic fever in the 70s. She was recently evaluated in the emergency department with pleuritic chest pain described as stabbing  in nature. She had done some heavy lifting prior to onset of those symptoms. In the emergency department, ECG was nonischemic and troponins were normal. She subsequently followed up with Dr. Saunders Revel and underwent stress testing which showed no evidence of ischemia or infarct. Echo was also performed and showed normal LV function with mild LVH and grade 1 diastolic dysfunction.  Since her last visit, she has continued to improve. She no longer experiences pleuritic chest discomfort. She notes a very mild lower sternal ache which is present all day long and does not change with activity. She is walking regularly without symptoms or limitations. She denies PND, orthopnea, dizziness, syncope, palpitations, edema, or early satiety.  Home Medications    Prior to Admission medications   Medication Sig Start Date End Date Taking? Authorizing Provider  aspirin 81 MG chewable tablet Chew 81 mg by mouth daily.   Yes [provider]  azelaic acid (AZELEX) 20 % cream Apply 1 application topically 2 (two) times daily. After skin is thoroughly washed and patted dry, gently but thoroughly massage a thin film of azelaic acid cream into the affected area twice daily, in the morning and evening.   Yes [provider]  clobetasol ointment (TEMOVATE) 6.81 % Apply 1 application topically 2 (two) times daily.   Yes [provider]  FLUoxetine (PROZAC) 20 MG tablet Take 20 mg by mouth 2 (two) times daily.   Yes [provider]  gabapentin (NEURONTIN) 300 MG capsule Take 600 mg by  mouth 3 (three) times daily.   Yes [provider]  halobetasol (ULTRAVATE) 0.05 % cream Apply 1 application topically 3 (three) times a week.   Yes [provider]  hydrochlorothiazide (HYDRODIURIL) 12.5 MG tablet Take 12.5 mg by mouth daily.   Yes [provider]  metroNIDAZOLE (METROCREAM) 0.75 % cream Apply 1 application topically 2 (two) times daily.   Yes [provider]    mineral oil-hydrophilic petrolatum (AQUAPHOR) ointment Apply 1 application topically 4 (four) times daily as needed for dry skin.   Yes [provider]  naproxen (NAPROSYN) 250 MG tablet Take 1 tablet (250 mg total) by mouth 2 (two) times daily as needed. 02/16/17  Yes End, Harrell Gave, MD  neomycin-polymyxin-dexameth (MAXITROL) 0.1 % OINT Place 1 application into the right eye 4 (four) times daily.   Yes [provider]  nitroGLYCERIN (NITROSTAT) 0.4 MG SL tablet Place 0.4 mg under the tongue every 5 (five) minutes as needed for chest pain.   Yes [provider]  nystatin (MYCOSTATIN/NYSTOP) powder Apply 1 g topically 2 (two) times daily.   Yes [provider]  pantoprazole (PROTONIX) 40 MG tablet Take 40 mg by mouth daily.   Yes [provider]  ranitidine (ZANTAC) 15 MG/ML syrup Take 150 mg by mouth at bedtime.   Yes [provider]  traMADol (ULTRAM) 50 MG tablet Take 1 tablet (50 mg total) by mouth every 6 (six) hours as needed. 02/11/17 02/11/18 Yes Earleen Newport, MD  triamcinolone ointment (KENALOG) 0.1 % Apply 1 application topically 2 (two) times daily.   Yes [provider]    Review of Systems    Mild residual lower sternal ache which is constant in nature. No further pleuritic chest pain. She denies palpitations, dyspnea, PND, orthopnea, dizziness, syncope, edema, or early satiety.  All other systems reviewed and are otherwise negative except as noted above.  Physical Exam    VS:  BP 116/76 (BP Location: Left Arm, Patient Position: Sitting, Cuff Size: Normal)   Pulse 80   Ht 5\' 5"  (5.102 m)   Wt 216 lb (98 kg)   BMI 35.94 kg/m  , BMI Body mass index is 35.94 kg/m. GEN: Well nourished, well developed, in no acute distress.  HEENT: normal.  Neck: Supple, no JVD, carotid bruits, or masses. Cardiac: RRR, no murmurs, rubs, or gallops. No clubbing, cyanosis, edema.  Radials/DP/PT 2+ and equal bilaterally.   Respiratory:  Respirations regular and unlabored, clear to auscultation bilaterally. GI: Soft, nontender, nondistended, BS + x 4. MS: no deformity or atrophy. Skin: warm and dry, no rash. Neuro:  Strength and sensation are intact. Psych: Normal affect.  Accessory Clinical Findings    2D Echocardiogram 4.25.2018 reviewed in detail with Ms. Quinto today.  Study Conclusions   - Left ventricle: The cavity size was normal. Wall thickness was   increased in a pattern of mild LVH. Systolic function was normal.   The estimated ejection fraction was in the range of 60% to 65%.   Wall motion was normal; there were no regional wall motion   abnormalities. Doppler parameters are consistent with abnormal   left ventricular relaxation (grade 1 diastolic dysfunction). - Right ventricle: The cavity size was normal. Systolic function   was normal. _____________  Carlton Adam Cardiolite 5.2.2018 - reviewed in detail with Ms. Hensarling today.  Pharmacological myocardial perfusion imaging study with no significant  ischemia Normal wall motion, EF estimated at 65% No EKG changes concerning for ischemia at peak stress  or in recovery. Low risk scan  Assessment & Plan    1.  Chest pain: Patient recently was evaluated in the emergency department with somewhat atypical and pleuritic chest discomfort. She ruled out. She subsequently underwent stress testing which was nonischemic. She has had no further pleuritic chest discomfort. She does have a long history of indigestion and takes both a PPI and a histamine blocker. She does have somewhat chronic, low level, lower sternal aching which has not changed in some time. This is not limiting activities. No further cardiac workup warranted.  2. Diastolic dysfunction: Heart rate and blood pressure stable. No evidence of volume overload.  3. Essential hypertension: Stable on diuretic therapy.  4. Depression/PTSD: Followed closely by primary care.  5. H/O  Rheumatic Fever: echo showed normal LV function without any significant valvular abnormalities.   6. Disposition: Patient will follow-up with Dr. Saunders Revel as needed in the future.   Murray Hodgkins, NP 03/15/2017, 9:12 AM

## 2017-05-03 NOTE — Progress Notes (Signed)
Visit Information    Have you changed or started any medications since your last visit including any over-the-counter medicines, vitamins, or herbal medicines? no   Are you having any side effects from any of your medications? -  no  Have you stopped taking any of your medications? Is so, why? -  no    Have you seen any other physician or provider since your last visit? No  Have you had any other diagnostic tests since your last visit? Yes - Records Requested mammogram   Have you been seen in the emergency room and/or had an admission to a hospital since we last saw you? No  Have you had your routine dental cleaning in the past 6 months? no    Have you activated your MyChart account? If not, what are your barriers? No:      No care team member to display        Health Maintenance   Topic Date Due   . Hepatitis C screen  1956/01/04   . HIV screen  05/28/1972   . DTaP/Tdap/Td vaccine (1 - Tdap) 05/28/1976   . Cervical cancer screen  05/28/1978   . Lipid screen  05/28/1997   . Breast cancer screen  05/29/2007   . Shingles Vaccine (1 of 2 - 2 Dose Series) 05/29/2007   . Colon cancer screen colonoscopy  05/29/2007   . Flu vaccine (1) 06/25/2017

## 2017-05-04 ENCOUNTER — Inpatient Hospital Stay: Attending: Family Medicine

## 2017-05-04 ENCOUNTER — Ambulatory Visit: Admit: 2017-05-04 | Discharge: 2017-05-04 | Payer: MEDICAID | Attending: Family Medicine

## 2017-05-04 DIAGNOSIS — F4310 Post-traumatic stress disorder, unspecified: Secondary | ICD-10-CM | POA: Insufficient documentation

## 2017-05-04 DIAGNOSIS — F3341 Major depressive disorder, recurrent, in partial remission: Secondary | ICD-10-CM | POA: Insufficient documentation

## 2017-05-04 DIAGNOSIS — G629 Polyneuropathy, unspecified: Secondary | ICD-10-CM | POA: Insufficient documentation

## 2017-05-04 DIAGNOSIS — G8929 Other chronic pain: Secondary | ICD-10-CM

## 2017-05-04 DIAGNOSIS — Z0000 Encounter for general adult medical examination without abnormal findings: Secondary | ICD-10-CM

## 2017-05-04 LAB — COMPREHENSIVE METABOLIC PANEL
ALT: 28 U/L (ref 13–69)
AST: 27 U/L (ref 15–46)
Albumin,Serum: 5 g/dL (ref 3.5–5.0)
Alkaline Phosphatase: 65 U/L (ref 38–126)
Anion Gap: 11 NA
BUN: 14 mg/dL (ref 7–20)
CO2: 26 mmol/L (ref 22–30)
Calcium: 10.2 mg/dL (ref 8.4–10.4)
Chloride: 105 mmol/L (ref 98–107)
Creatinine: 0.88 mg/dL (ref 0.52–1.25)
EGFR IF NonAfrican American: >60 mL/min (ref >60)
Glucose: 92 mg/dL (ref 70–100)
Potassium: 3.8 mmol/L (ref 3.5–5.1)
Sodium: 142 mmol/L (ref 137–145)
Total Bilirubin: 0.7 mg/dL (ref 0.2–1.3)
Total Protein: 7.7 g/dL (ref 6.3–8.2)
eGFR African American: >60 mL/min (ref >60)

## 2017-05-04 LAB — CBC WITH AUTO DIFFERENTIAL
Absolute Baso #: 0.1 10*3/uL (ref 0.0–0.2)
Absolute Eos #: 0.1 10*3/uL (ref 0.0–0.5)
Absolute Lymph #: 1.8 10*3/uL (ref 1.0–4.3)
Absolute Mono #: 0.5 10*3/uL (ref 0.0–0.8)
Absolute Neut #: 3.2 10*3/uL (ref 1.8–7.0)
Basophils: 1.2 % (ref 0.0–2.0)
Eosinophils: 1 % (ref 1.0–6.0)
Granulocytes %: 57.2 % (ref 40.0–80.0)
Hematocrit: 42.5 % (ref 35.0–47.0)
Hemoglobin: 14.4 g/dL (ref 11.7–16.0)
Lymphocyte %: 32.3 % (ref 20.0–40.0)
MCH: 28.3 pg (ref 26.0–34.0)
MCHC: 33.9 % (ref 32.0–36.0)
MCV: 83.5 fL (ref 79.0–98.0)
MPV: 8.2 fL (ref 7.4–10.4)
Monocytes: 8.3 % (ref 2.0–10.0)
Platelets: 417 10*3/uL (ref 140–440)
RBC: 5.09 10*6/uL (ref 3.80–5.20)
RDW: 14.9 % — ABNORMAL HIGH (ref 11.5–14.5)
WBC: 5.5 10*3/uL (ref 3.6–10.7)

## 2017-05-04 LAB — LIPID PANEL
Chol/HDL Ratio: 5 NA
Cholesterol: 212 mg/dL — AB (ref <200)
HDL: 40 mg/dL (ref 40–60)
LDL Cholesterol: 144 mg/dL — AB (ref <100)
Triglycerides: 140 mg/dL (ref <150)

## 2017-05-04 LAB — TSH: TSH: 0.867 u[IU]/mL (ref 0.465–4.680)

## 2017-05-04 MED ORDER — HYDROCORTISONE 2.5 % EX CREA
2.5 % | CUTANEOUS | 0 refills | Status: DC
Start: 2017-05-04 — End: 2017-08-03

## 2017-05-04 MED ORDER — FLUOXETINE HCL 40 MG PO CAPS
40 | ORAL_CAPSULE | Freq: Two times a day (BID) | ORAL | 2 refills | Status: DC
Start: 2017-05-04 — End: 2017-05-23

## 2017-05-04 NOTE — Other (Unsigned)
Patient Acct Nbr: [id number]   Primary AUTH/CERT:   Primary Insurance Company Name: [employer]  Primary Insurance Plan name: [facility]  Primary Insurance Group Number: [id number]  Primary Insurance Plan Type: Health  Primary Insurance Policy Number: [id number]

## 2017-05-04 NOTE — Progress Notes (Signed)
Subjective:     Patient: Mariah West is a 61 y.o. female     HPI   For an annual physical  [place].  Had been seen by PCP in [place].     Tries to keep to a healthy diet and has increased physical activity  Reports 14 lb weight loss since May with diet and exercise.   Up to date with breast cancer screening with mammogram.  Unsure of when repeat colonoscopy is due.       HYPERTENSION   Compliant with medication, low sodium diet, exercise.  Occasional  chest pain. Had recent normal stress test, no SOB, no edema, occasional right sided headaches, no dizziness    Right ankle pain x 4 months. Swelling, pain shoots up the calf. Has been icing, using a brace to the area. No history of trauma. Never occurred before     Skin lesion - x months, itches. Not sure if increasing in size.     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 neurontin helps her pains.     GERD - takes protonix to help control symptoms.     Review of Systems   Constitutional: Negative for chills and fever.   HENT: Negative for congestion, ear pain and sore throat.    Respiratory: Negative for chest tightness and shortness of breath.    Cardiovascular: Positive for chest pain and leg swelling. Negative for palpitations.   Gastrointestinal: Negative for abdominal pain, diarrhea, nausea and vomiting.   Musculoskeletal: Positive for arthralgias and joint swelling.   Skin: Positive for rash.   Neurological: Positive for headaches. Negative for dizziness.   Psychiatric/Behavioral: Positive for dysphoric mood.        Allergies   Allergen Reactions   . Atorvastatin    . Blue Dyes (Parenteral)    . Blueberry Flavor    . Codeine    . Cymbalta [Duloxetine Hcl]    . Nsaids      Current Outpatient Prescriptions on File Prior to Visit   Medication Sig Dispense Refill    . hydrochlorothiazide (MICROZIDE) 12.5 MG capsule Take 12.5 mg by mouth daily     . fluticasone (FLONASE) 50 MCG/ACT nasal spray 2 sprays by Nasal route daily     . pantoprazole (PROTONIX) 40 MG tablet Take 40 mg by mouth daily     . gabapentin (NEURONTIN) 300 MG capsule Take 600 mg by mouth 3 times daily..     . ranitidine (ZANTAC) 150 MG/10ML syrup Take by mouth 2 times daily     . halobetasol (ULTRAVATE) 0.05 % ointment Apply topically 2 times daily Apply topically 2 times daily.       No current facility-administered medications on file prior to visit.       Past Medical History:   Diagnosis Date   . Depression    . GERD (gastroesophageal reflux disease)    . Hypertension    . PTSD (post-traumatic stress disorder)    . Squamous carcinoma    . Thyroid goiter       Past Surgical History:   Procedure Laterality Date   . APPENDECTOMY     . EYE SURGERY      bilateral    . HAND SURGERY  R   . HYSTERECTOMY, VAGINAL     . TONSILLECTOMY AND ADENOIDECTOMY     . TUMOR REMOVAL      neck      Family History   Problem Relation Age of Onset   . Heart Failure Mother    . Heart Attack Mother         x3   . High Blood Pressure Mother    . Diabetes Mother    . Arthritis Mother    . Heart Failure Father    . Arthritis Father    . High Blood Pressure Sister    . High Blood Pressure Brother    . Breast Cancer Paternal Aunt    . Cancer Paternal Uncle         leukemia      Social History   Substance Use Topics   . Smoking status: Former Smoker     Packs/day: 2.00     Years: 40.00     Quit date: 2007   . Smokeless tobacco: Never Used   . Alcohol use No      Objective:     Vitals:    05/04/17 1016   BP: 116/64   Site: Right Arm   Position: Sitting   Cuff Size: Large Adult   Pulse: 72   Temp: 98.2 F (36.8 C)   SpO2: 95%   Weight: 204 lb 3.2 oz (92.6 kg)   Height: 5\' 5"  (1.651 m)       Physical Exam   Constitutional: Mariah West is oriented to person, place, and time. Mariah West appears well-nourished. No distress.   HENT:   Head: Normocephalic.    Right Ear: External ear normal.   Left Ear: External ear normal.   Nose: Nose normal.   Mouth/Throat: Oropharynx is clear and moist. No oropharyngeal exudate.   Eyes: Conjunctivae are normal. Pupils are equal, round, and reactive to light. Right eye exhibits no discharge. Left eye exhibits no discharge. No scleral icterus.   Neck: Normal range of motion. Neck supple. No thyromegaly present.   Cardiovascular: Normal rate, regular rhythm and normal heart sounds.  Exam reveals no gallop and no friction rub.    No murmur heard.  Pulmonary/Chest: Effort normal and breath sounds normal. No respiratory distress. Mariah West has no wheezes. Mariah West has no rales.   Abdominal: Soft. Mariah West exhibits no distension and no mass. There is no tenderness. There is no rebound and no guarding.   Musculoskeletal: Normal range of motion. Mariah West exhibits no edema.   Lymphadenopathy:     Mariah West has no cervical adenopathy.   Neurological: Mariah West is alert and oriented to person, place, and time. No cranial nerve deficit. Mariah West exhibits normal muscle tone.   Skin: Skin is warm and dry. No rash noted.   Raised hyperpigmented roughly circular lesion to the left mid back. No surrounding erythema, non tender.    Psychiatric: Mariah West has a normal mood and affect. Her behavior is normal.   Nursing note and vitals reviewed.      Assessment      1. General medical exam    2. Chronic pain of right ankle    3. Lung nodule    4. Skin lesion    5. Gastroesophageal reflux disease, esophagitis presence not specified    6. Essential hypertension    7. Recurrent major depressive disorder, in partial remission (HCC)    8. PTSD (post-traumatic stress disorder)    9. Neuropathy (HCC)    10. Screening for  colon cancer    11. Need for hepatitis C screening test    12. Positive depression screening    13. BMI 33.0-33.9,adult         Plan      1. General medical exam  Check general blood tests.   Up to date with mammogram  Recommended repeat colonoscopy.   - Comprehensive Metabolic Panel;  Future  - Hemoglobin A1C; Future  - CBC Auto Differential; Future  - Lipid Panel; Future  - TSH without Reflex; Future    2. Chronic pain of right ankle  Possible achilles tendinitis  Unable to tolerate NSAIDs.   Check Xray of the ankle  Refer to sports medicine to evaluate and treat.  - [facility] [place]  - XR ANKLE RIGHT (MIN 3 VIEWS); Future    3. Lung nodule  Bilateral  Repeat CT chest to re-evaluate.   - CT Chest WO Contrast; Future    4. Skin lesion  To back.   Rx itching with hydrocortisone cream.   Refer to dermatology to evaluate.   - hydrocortisone 2.5 % cream; Apply topically 2 times daily.  Dispense: 28 g; Refill: 0  - External Referral to Dermatology    5. Gastroesophageal reflux disease, esophagitis presence not specified  Controlled on PPI, continue.     6. Essential hypertension  Controlled  Continue present medication.   Recheck renal function and electrolytes.     7. Recurrent major depressive disorder, in partial remission (HCC)  8. PTSD (post-traumatic stress disorder)  Continue- FLUoxetine (PROZAC) 40 MG capsule; Take 1 capsule by mouth 2 times daily  Dispense: 60 capsule; Refill: 2  -refer to External Referral to Psychiatry    9. Neuropathy (HCC)  To right face   Continue neurontin which helps.     10. Screening for colon cancer  - External Referral to Gastroenterology    12. Need for hepatitis C screening test  - Hepatitis C Antibody; Future    13. Positive depression screening  - Positive Screen for Clinical Depression with a Documented Follow-up Plan [phone]  On the basis of positive PHQ-9 screening ( ), the following plan was implemented: continue Prozac, refer to psychiatrist.  Patient will follow-up in 3 month(s) with PCP.    Emireth received counseling on the following healthy behaviors: nutrition and exercise  Reviewed prior labs and health maintenance  Continue current medications, diet and exercise.  Discussed use, benefit, and side effects of prescribed medications.  Barriers to medication compliance addressed.   Patient given educational materials - see patient instructions  Was a self-tracking handout given in paper form or via MyChart? Yes    Requested Prescriptions     Signed Prescriptions Disp Refills   . FLUoxetine (PROZAC) 40 MG capsule 60 capsule 2     Sig: Take 1 capsule by mouth 2 times daily   . hydrocortisone 2.5 % cream 28 g 0     Sig: Apply topically 2 times daily.       All patient questions answered.  Patient voiced understanding.    Quality Measures    Body mass index is 33.98 kg/m. Elevated. Weight control planned discussed Healthy diet and regular exercise.    BP: 116/64 Blood pressure is normal. Treatment plan consists of No treatment change needed.    No results found for: LDLCALC, LDLCHOLESTEROL, LDLDIRECT (goal LDL reduction with dx if diabetes is 50% LDL reduction)      PHQ Scores 05/03/2017   PHQ2 Score 6  PHQ9 Score 10     Interpretation of Total Score Depression Severity: 1-4 = Minimal depression, 5-9 = Mild depression, 10-14 = Moderate depression, 15-19 = Moderately severe depression, 20-27 = Severe depression    Chance Karam Manya Silvas, MD  05/04/17  12:10 PM

## 2017-05-05 LAB — HEMOGLOBIN A1C
Hemoglobin A1C: 5.6 % (ref 4.0–5.7)
eAG: 114 mg/dL

## 2017-05-05 LAB — HEPATITIS C ANTIBODY: Hepatitis C Ab: NOT DETECTED NA

## 2017-05-23 MED ORDER — FLUOXETINE HCL 20 MG PO CAPS
20 MG | ORAL_CAPSULE | Freq: Two times a day (BID) | ORAL | 3 refills | Status: DC
Start: 2017-05-23 — End: 2017-06-14

## 2017-05-23 NOTE — Telephone Encounter (Signed)
Pt states that she was out of town and her sister picked up her prozac. It was sent in as 40 mg bid but she is suppose to be fluoxetine capsule 20 mg bid. Please resend thanks.

## 2017-05-25 ENCOUNTER — Encounter

## 2017-05-31 ENCOUNTER — Encounter

## 2017-06-14 MED ORDER — FLUOXETINE HCL 20 MG PO CAPS
20 | ORAL_CAPSULE | Freq: Two times a day (BID) | ORAL | 3 refills | Status: DC
Start: 2017-06-14 — End: 2017-06-22

## 2017-06-14 NOTE — Telephone Encounter (Signed)
Rx needs resent to the pharmacy   Pt informed us that pharmacy had not received Rx   Tried calling pharmacy multiple times, was put on holds longer than 10 minutes with no one getting back to me

## 2017-06-16 ENCOUNTER — Ambulatory Visit

## 2017-06-22 ENCOUNTER — Ambulatory Visit: Admit: 2017-06-22 | Discharge: 2017-06-22 | Payer: MEDICAID | Attending: Foot and Ankle Surgery

## 2017-06-22 DIAGNOSIS — M5416 Radiculopathy, lumbar region: Secondary | ICD-10-CM

## 2017-06-22 NOTE — Progress Notes (Signed)
[facility] - [facility]  [address]  [address] [address]  Dept: [phone]  Loc: [phone]          Lorrena Goranson  [birth year]  [id number]  06/22/2017    HPI  Calysta is a 61 y.o. female who is here today for evaluation of her Right ankle    Floride has a chief complaint of pain in the right ankle    Sherilyn reports the current issue started approximately 4 month(s) ago. With no known mechanism of injury. She has history of fractured ankle.     Current symptoms: pain and swelling.  She has that his pain in the back of the ankle.  She thinks might have some swelling.  She got a brace at [facility] which she thinks may have helped.  She notices anytime she lays flat or stands for periods time she gets the pain that starts in the back the ankle goes up to the side of her leg in the back of the thigh up into her buttocks.  She describes the pain as burning or electric in nature.    Maycel has seen another medical professional for this problem: Yes, PCP ordered xray.    Aleana has tried or been treated with the following: icing, rest, avoids activities that aggravate the problem, finds shoes that are comfortable, NSAIDs or other OTC medications and a brace/splint/boot    Review of Systems   Surgical Risk Factors:   Allergies to Metals or Latex: Yes, Nickle   Have you been treated for a blood clot:  NO   Have you had a history of bleeding disorder:  NO   Have you had a history of Anesthetic problems: NO   Do you have tendency to bruise easily:  NO   Do you experience prolonged or excessive bleeding from cuts or after surgery:NO  General/Constitutional:     General: NO   Cancer:  NO   Acute/Chronic Infections: NO  HEENT/Neck:   Problems with theThroat:  NO   Problems with the Eyes:  NO   Problems with the Ears:  NO   Problems with the Nose and Sinuses:  NO  Endocrine:   Problems with Diabetes: NO   Problems with Thyroid Disorder: Thyroid Goiter  Thorax:     Problems with the Heart: Heart  Disease   Problems with the Lung:  NO  Cardiovascular:   Problems with Circulation:  NO   Problems with High Blood pressure:  Yes controlled with medication  Gastrointestinal:   Problems with Ulcers:  NO   Problems with the Liver:  NO   Problems with Bowel Habits:  GERD  Genitourinary:   Problems with the Genitals: NO   Urinary problems: NO   Kidney disease or stones:  NO  Skin:    Any general problems:  NO  Neurologic:   Dizziness, blurred vision, headaches, problems with balance :  NO   Seizures or Stroke: NO  Psychiatric:   Emotional or Psychological disorders:  NO   Depression or Anxiety:  NO       Allergies   Allergen Reactions   . Duloxetine      Rash on feet and hands   . Atorvastatin      Muscle pain   . Blue Dyes (Parenteral)      Told to avoid due to patch test result by Abundio Miu   . Blueberry Flavor    . Cymbalta [  Duloxetine Hcl]    . Nickel    . Nsaids    . Codeine Itching     RASH   . Meloxicam Nausea And Vomiting   . Naproxen Nausea And Vomiting     Current Outpatient Prescriptions   Medication Sig Dispense Refill   . ranitidine (ZANTAC) 15 MG/ML syrup Take 150 mg by mouth     . oxyCODONE-acetaminophen (PERCOCET) 5-325 MG per tablet TAKE 1 TABLET BY MOUTH EVERY 6 HOURS  0   . nystatin (MYCOSTATIN) 100000 UNIT/GM powder Apply once or twice daily to PREVENT rash under breasts     . nitroGLYCERIN (NITROSTAT) 0.4 MG SL tablet Place 0.4 mg under the tongue     . metroNIDAZOLE (METROCREAM) 0.75 % cream Apply topically     . Emollient (AQUAPHOR EX) Apply topically     . Neomycin-Polymyxin-Dexameth (MAXITROL) 0.1 % OINT Apply to eye     . FLUoxetine (PROZAC) 20 MG tablet Take 20 mg by mouth     . doxycycline hyclate (VIBRAMYCIN) 100 MG capsule Take 100 mg by mouth     . clobetasol (TEMOVATE) 0.05 % ointment Apply topically     . azelaic acid (AZELEX) 20 % cream Apply topically     . aspirin 81 MG chewable tablet Take 81 mg by mouth daily     . triamcinolone (KENALOG) 0.1 % ointment Apply topically 2 times  daily Apply topically 2 times daily.     . hydrochlorothiazide (MICROZIDE) 12.5 MG capsule Take 12.5 mg by mouth daily     . pantoprazole (PROTONIX) 40 MG tablet Take 40 mg by mouth daily     . gabapentin (NEURONTIN) 300 MG capsule Take 600 mg by mouth 3 times daily..     . halobetasol (ULTRAVATE) 0.05 % ointment Apply topically 2 times daily Apply topically 2 times daily.     . hydrocortisone 2.5 % cream Apply topically 2 times daily. 28 g 0   . fluticasone (FLONASE) 50 MCG/ACT nasal spray 2 sprays by Nasal route daily       No current facility-administered medications for this visit.      Past Medical History:   Diagnosis Date   . Depression    . GERD (gastroesophageal reflux disease)    . Hypertension    . PTSD (post-traumatic stress disorder)    . Squamous carcinoma    . Thyroid goiter      Past Surgical History:   Procedure Laterality Date   . APPENDECTOMY     . EYE SURGERY      bilateral    . HAND SURGERY      R   . HYSTERECTOMY, VAGINAL     . TONSILLECTOMY AND ADENOIDECTOMY     . TUMOR REMOVAL      neck      Social History     Social History   . Marital status: Divorced     Spouse name: N/A   . Number of children: N/A   . Years of education: N/A     Occupational History   . Not on file.     Social History Main Topics   . Smoking status: Former Smoker     Packs/day: 2.00     Years: 40.00     Quit date: 2007   . Smokeless tobacco: Never Used   . Alcohol use No   . Drug use: No   . Sexual activity: No     Other Topics Concern   .  Not on file     Social History Narrative   . No narrative on file     Family History   Problem Relation Age of Onset   . Heart Failure Mother    . Heart Attack Mother         x3   . High Blood Pressure Mother    . Diabetes Mother    . Arthritis Mother    . Heart Failure Father    . Arthritis Father    . High Blood Pressure Sister    . High Blood Pressure Brother    . Breast Cancer Paternal Aunt    . Cancer Paternal Uncle         leukemia      Vitals:    06/22/17 1307   Pulse: 86        Physical Exam  This is an age appropriate appearing female who is alert and oriented x 3.  The patient appears well nourished.  Psychiatric: The patient is able to verbalize normally and seems to have a good understanding of her situation.    HENT:   Head: Normocephalic.   Right Ear: External ear normal.   Left Ear: External ear normal.   Nose: Nose normal.   Neck: Normal range of motion.   Pulmonary/Chest: Effort normal. No accessory muscle usage. No respiratory distress.   Abdominal: Normal appearance.        Right lower extremity examination    Lymphatic System:  No signs of lymphedema, no lymphadenopathy  No areas of swelling are seen     Vascular:  Dorsalis pedis pulse: 2+  Posterior tibial pulse:  2+  Capillary refill is less than 3 seconds  Varicosities absent     Skin/nails:  Normal appearance, warm and dry  No clubbing, no cyanosis  Nails normal appearance  Hair growth present on foot and toes    Neurologic:  Sensation intact to light touch throughout the foot and the ankle, reflexes normal    Muscle:  Muscle strength testing:    Anterior tibialis: 5/5    Posterior tibialis: 5/5    Peroneus brevis:  5/5    Peroneus longus:  5/5    Gastrocsoleus:  5/5     Positive straight leg raise with the patient's pain being elicited    Gait and Station:  Cordella is able to ambulate with a normal gait  Larcenia is able to stand unassisted and maintains balance    No clinical photos were taken during today's visit   Radiographic finding:     I reviewed the Jihan's previous x-ray examination and noted the following findings..  2 nonweightbearing x-rays of the right ankle were reviewed.  These show no bony or soft tissue abnormalities.  The joints are anatomically aligned.    Lab Review:  No labs were reviewed/no labs were available for review this visit.    IMPRESSION:      ICD-10-CM ICD-9-CM    1. Lumbar radiculopathy M54.16 724.4 External Referral to Physical Therapy       PLAN:    I explained to Raiven that based on her  history and exam today I think that she has a lumbar radiculopathy.  I made a recommendation for physical therapy.  If she fails the therapy I would then have her see one of our spine surgeons for further workup.  Eleyna was comfortable with the plan.    Norvella was given a referral to physical therapy today.  I explained  that if therapy is causing any problems then Justyne needs to discuss it with her therapist and if problems persist then she needs to call me.  I explained that I have recommended therapy 2 times a week for 6 weeks.      Return if symptoms worsen or fail to improve.    Electronically signed by Ronne Binning on 06/22/17 at 1:08 PM

## 2017-06-30 ENCOUNTER — Inpatient Hospital Stay: Admit: 2017-06-30 | Attending: Family Medicine

## 2017-06-30 NOTE — Other (Unsigned)
Patient Acct Nbr: [id number]   Primary AUTH/CERT: [id number]  Primary Insurance Company Name: [facility]  Primary Insurance Plan name: [facility]  Primary Insurance Group Number: [id number]  Primary Insurance Plan Type: Health  Primary Insurance Policy Number: [id number]

## 2017-07-18 MED ORDER — HYDROCHLOROTHIAZIDE 12.5 MG PO CAPS
12.5 | ORAL_CAPSULE | Freq: Every day | ORAL | 5 refills | Status: DC
Start: 2017-07-18 — End: 2017-12-29

## 2017-07-21 NOTE — Other (Unsigned)
Patient Acct Nbr: [id number]   Primary AUTH/CERT:   Primary Insurance Company Name: [facility]  [employer] Insurance Plan name: [facility] [facility]  Primary Insurance Group Number: [id number]  Primary Insurance Plan Type: Health  Primary Insurance Policy Number: [id number]

## 2017-07-22 MED ORDER — HALOBETASOL PROPIONATE 0.05 % EX OINT
0.05 | Freq: Two times a day (BID) | CUTANEOUS | 0 refills | Status: DC
Start: 2017-07-22 — End: 2017-08-03

## 2017-07-22 NOTE — Telephone Encounter (Signed)
Refilled. Thanks.

## 2017-07-22 NOTE — Telephone Encounter (Signed)
Asking for a refill of halobetasol ointment. She went to get a refill and it expired so she needs a new one. She goes to [facility] on [address].

## 2017-07-22 NOTE — Telephone Encounter (Signed)
Called and informed patient that medication is ready at the pharmacy to call to ensure it is ready to be picked up. Patient informed and understands

## 2017-07-29 NOTE — Telephone Encounter (Signed)
Halobetasol needs PA  Form completed and waiting for sig

## 2017-07-30 ENCOUNTER — Other Ambulatory Visit: Payer: Self-pay

## 2017-08-02 ENCOUNTER — Emergency Department (HOSPITAL_COMMUNITY): Payer: MEDICAID

## 2017-08-02 ENCOUNTER — Emergency Department
Admission: EM | Admit: 2017-08-02 | Discharge: 2017-08-02 | Disposition: A | Discharge status: Home / Self Care | Payer: MEDICAID | Attending: NURSE PRACTITIONER | Admitting: NURSE PRACTITIONER

## 2017-08-02 ENCOUNTER — Encounter (HOSPITAL_COMMUNITY): Payer: Self-pay

## 2017-08-02 DIAGNOSIS — K219 Gastro-esophageal reflux disease without esophagitis: Secondary | ICD-10-CM | POA: Insufficient documentation

## 2017-08-02 DIAGNOSIS — I2510 Atherosclerotic heart disease of native coronary artery without angina pectoris: Secondary | ICD-10-CM | POA: Insufficient documentation

## 2017-08-02 DIAGNOSIS — Z87891 Personal history of nicotine dependence: Secondary | ICD-10-CM | POA: Insufficient documentation

## 2017-08-02 DIAGNOSIS — E785 Hyperlipidemia, unspecified: Secondary | ICD-10-CM | POA: Insufficient documentation

## 2017-08-02 DIAGNOSIS — G8929 Other chronic pain: Secondary | ICD-10-CM | POA: Insufficient documentation

## 2017-08-02 DIAGNOSIS — N3001 Acute cystitis with hematuria: Principal | ICD-10-CM | POA: Insufficient documentation

## 2017-08-02 DIAGNOSIS — Z79899 Other long term (current) drug therapy: Secondary | ICD-10-CM | POA: Insufficient documentation

## 2017-08-02 DIAGNOSIS — Z7982 Long term (current) use of aspirin: Secondary | ICD-10-CM | POA: Insufficient documentation

## 2017-08-02 LAB — CBC WITH DIFF
BASOPHIL #: 0.08 x10ˆ3/uL (ref 0.00–0.20)
BASOPHIL %: 1 %
EOSINOPHIL #: 0.09 x10ˆ3/uL (ref 0.00–0.50)
EOSINOPHIL %: 1 %
HCT: 42.9 % (ref 33.5–45.2)
HGB: 14.5 g/dL (ref 11.2–15.2)
LYMPHOCYTE #: 2.02 x10ˆ3/uL (ref 1.00–4.80)
LYMPHOCYTE %: 26 %
MCH: 28.9 pg (ref 27.4–33.0)
MCHC: 33.9 g/dL (ref 32.5–35.8)
MCV: 85.4 fL (ref 78.0–100.0)
MONOCYTE #: 0.53 x10ˆ3/uL (ref 0.30–1.00)
MONOCYTE %: 7 %
MPV: 7.3 fL — ABNORMAL LOW (ref 7.5–11.5)
NEUTROPHIL #: 5.13 x10ˆ3/uL (ref 1.50–7.70)
NEUTROPHIL %: 65 %
PLATELETS: 414 x10ˆ3/uL (ref 140–450)
RBC: 5.03 x10?6/uL — ABNORMAL HIGH (ref 3.63–4.92)
RBC: 5.03 x10ˆ6/uL — ABNORMAL HIGH (ref 3.63–4.92)
RDW: 15.2 % — ABNORMAL HIGH (ref 12.0–15.0)
WBC: 7.8 x10ˆ3/uL (ref 3.5–11.0)

## 2017-08-02 LAB — URINALYSIS, MACROSCOPIC
BILIRUBIN: NEGATIVE mg/dL
COLOR: NORMAL
GLUCOSE: NEGATIVE mg/dL
KETONES: NEGATIVE mg/dL
NITRITE: NEGATIVE
PH: 7 (ref 5.0–8.0)
PROTEIN: NEGATIVE mg/dL
SPECIFIC GRAVITY: 1.015 (ref 1.005–1.030)
UROBILINOGEN: 2 mg/dL — AB

## 2017-08-02 LAB — BASIC METABOLIC PANEL
ANION GAP: 8 mmol/L (ref 4–13)
BUN/CREA RATIO: 14 (ref 6–22)
BUN: 14 mg/dL (ref 8–25)
CALCIUM: 10.1 mg/dL (ref 8.5–10.2)
CHLORIDE: 105 mmol/L (ref 96–111)
CO2 TOTAL: 24 mmol/L (ref 22–32)
CREATININE: 1.01 mg/dL (ref 0.49–1.10)
ESTIMATED GFR: 56 mL/min/1.73mˆ2 — ABNORMAL LOW (ref >59)
GLUCOSE: 96 mg/dL (ref 65–139)
POTASSIUM: 3.9 mmol/L (ref 3.5–5.1)
SODIUM: 137 mmol/L (ref 136–145)

## 2017-08-02 LAB — URINALYSIS, MICROSCOPIC
RBCS: 18 /HPF — ABNORMAL HIGH (ref <6.0)
WBCS: 81 /HPF — ABNORMAL HIGH (ref <11.0)

## 2017-08-02 MED ORDER — NITROFURANTOIN MONOHYDRATE/MACROCRYSTALS 100 MG CAPSULE: 100 mg | Cap | Freq: Two times a day (BID) | ORAL | 0 refills | 0 days | Status: AC

## 2017-08-02 MED ORDER — PHENAZOPYRIDINE 100 MG TABLET
100.00 mg | ORAL_TABLET | ORAL | Status: AC
Start: 2017-08-02 — End: 2017-08-02
  Administered 2017-08-02: 100 mg via ORAL
  Filled 2017-08-02: qty 1

## 2017-08-02 MED ORDER — SODIUM CHLORIDE 0.9 % INTRAVENOUS SOLUTION
INTRAVENOUS | Status: DC
Start: 2017-08-02 — End: 2017-08-02

## 2017-08-02 MED ORDER — PHENAZOPYRIDINE 100 MG TABLET: 100 mg | Tab | Freq: Three times a day (TID) | ORAL | 0 refills | 0 days | Status: DC | PRN

## 2017-08-02 NOTE — ED Nurses Note (Signed)
Pt c/o dysuria and hematuria for last day. Denies further/systemic complaints. PIV est and lab work sent. Pt medicated per MAR. Moved to RP to await results.

## 2017-08-02 NOTE — ED Nurses Note (Signed)
Pt instructed on discharge information. Pt verbalized understanding and denied any questions. PIV removed, catheter intact upon removal. Pt guided to exit by RN.

## 2017-08-02 NOTE — ED Provider Notes (Signed)
Department of Emergency Medicine  HPI - 08/02/2017    Attending Provider: Dr. Atlee Abide  Advanced Practice Provider: Emelda Brothers, APRN, ENP, [facility]    Chief Complaint:   pain on urination in abdomen     History of Present Illness:   [name], 61 y.o. female   Patient reports to the ED via POV with c/o pain on urination in her abdominal area that onset yesterday. Patient reports she had severe pain on urination with dysuria yesterday. States today when she urinated, she noticed blood after. Of note, endorses to chronic back pain. Denies fever, chills, or any other issue or concerns at this time. Per charting, PMHx includes HLD, CAD, heart murmur. Per charting, PSHx includes total vaginal hysterectomy, appendectomy, cholecystectomy. Allergies: Cymbalta, Blue dye, Lipitor, Nicekl, Mobic, Naproxen.     Review of Systems:   Constitutional: No fever, chills, or weakness   Skin: No rashes or diaphoresis   HENT: No congestion   Eyes: No vision changes, discharge   Cardio: No chest pain, palpitations or leg swelling    Respiratory: No cough, wheezing or SOB   GI:  No nausea, vomiting, diarrhea, constipation   GU:  +urinary pain + dysuria +hematuria    MSK: No joint or back pain   Neuro: No loss of sensation, focal deficits, headaches, or LOC    All other symptoms reviewed and are negative    Medications:  Prior to Admission Medications   Prescriptions Last Dose Informant Patient Reported? Taking?   Azelaic Acid 20 % Cream   No No   Sig: Apply topically Twice daily   Clobetasol (TEMOVATE) 0.05 % Ointment   No No   Sig: Apply topically Twice daily To rash on hands and feet   FLUoxetine (PROZAC) 20 mg Oral Tablet   No No   Sig: Take 1 Tab (20 mg total) by mouth Once a day   HYDROcodone-acetaminophen (NORCO) 5-325 mg Oral Tablet   No No   Sig: Take 1 Tab by mouth Every 4 hours as needed for Pain   Halobetasol Propionate (ULTRAVATE) 0.05 % Cream   No No   Sig: Apply  topically. Three times a week    Metronidazole (METROCREAM) 0.75 % Cream   No No   Sig: Apply topically Twice daily   aspirin 81 mg Oral Tablet, Chewable   No No   Sig: Take 1 Tab (81 mg total) by mouth Once a day   doxycycline hyclate (VIBRAMYCIN) 100 mg Oral Capsule   No No   Sig: Take 1 Cap (100 mg total) by mouth Twice daily   gabapentin (NEURONTIN) 300 mg Oral Capsule   No No   Sig: Take 2 Caps (600 mg total) by mouth Three times a day   hydroCHLOROthiazide (HYDRODIURIL) 12.5 mg Oral Tablet   No No   Sig: Take 1 Tab (12.5 mg total) by mouth Once a day   mineral oil-hydrophil petrolat (AQUAPHOR) Ointment   No No   Sig: by Apply Topically route Four times a day as needed   neomycin-polymyxin-dexamethasone (MAXITROL) 3.5 mg/g-10,000 unit/g-0.1 % Ophthalmic Ointment   No No   Sig: Instill into right eye Four times a day   nitroGLYCERIN (NITROSTAT) 0.4 mg Sublingual Tablet, Sublingual   No No   Sig: 1 Tab (0.4 mg total) by Sublingual route Every 5 minutes as needed for Chest pain for up to 3 doses for 3 doses over 15 minutes   nystatin (NYSTOP) 100,000 unit/gram Powder  No No   Sig: Apply once or twice daily to PREVENT rash under breasts   pantoprazole (PROTONIX) 40 mg Oral Tablet, Delayed Release (E.C.)   No No   Sig: Take 1 Tab (40 mg total) by mouth Every morning before breakfast   raNITIdine (ZANTAC) 15 mg/mL Oral Syrup   No No   Sig: Take 10 mL (150 mg total) by mouth Every night   triamcinolone acetonide (ARISTOCORT A) 0.1 % Ointment   No No   Sig: by Apply Topically route Twice daily      Facility-Administered Medications: None       Allergies:  Allergies   Allergen Reactions   . Cymbalta [Duloxetine]      Rash on feet and hands   . Blue Dye      Told to avoid due to patch test result by [facility]   . Lipitor [Atorvastatin]      Muscle pain   . Nickel    . Codeine Itching     RASH   . Mobic [Meloxicam] Nausea/ Vomiting   . Naprosyn [Naproxen] Nausea/ Vomiting       Past Medical History:  Past Medical History:   Diagnosis Date   . Arthritis     . Depression    . GERD (gastroesophageal reflux disease)     controlled w/med   . Heart murmur 1979    benign   . Hyperlipidemia LDL goal < 100 08/29/2013   . MINOR CAD (coronary artery disease) 11/10/2012    no treatment other than cholesterol medications; follows regularly with Dr. Rodney Booze   . Multinodular goiter    . Rheumatic fever 17    no complications   . Shortness of breath     on exertion   . Squamous cell carcinoma 02/06/2015    right side of neck   . Vaginal prolapse 2012    surgery improved significantly   . Wears glasses        Past Surgical History:  Past Surgical History:   Procedure Laterality Date   . COLONOSCOPY  01/06/09    COLONOSCOPY performed by Loletta Parish F at [place]   . GASTROSCOPY  03/05/2010    GASTROSCOPY performed by Jocelyn Lamer at [place]   . GASTROSCOPY WITH BIOPSY  03/05/2010    GASTROSCOPY WITH BIOPSY performed by Jocelyn Lamer at [place]   . HX ADENOIDECTOMY     . HX ANKLE FRACTURE [place]  2007    left distal fibula, casted   . HX APPENDECTOMY     . HX CHOLECYSTECTOMY     . HX COLONOSCOPY     . HX CYSTOCELE REPAIR  09/24/09   . HX HAND SURGERY  2012    for Carpal tunnel    . HX OOPHORECTOMY      left ovary removed   . HX TONSILLECTOMY     . HX TOTAL VAGINAL HYSTERECTOMY  1979   . HX WRIST FRACTURE [place]  2007    [place] injury   . PB REVISE ULNAR NERVE AT ELBOW  1979   . PB UPPER GI ENDOSCOPY,BIOPSY  12/19/07    patulous GE junction zone, erythema, nonerosive GERD       Social History:  Social History     Social History   . Marital status: Single     Spouse name: N/A   . Number of children: N/A   . Years of education: N/A     Occupational  History   . 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 Smoking status: Former Smoker     Packs/day: 2.00     Years: 40.00     Types: Cigarettes     Quit date: 01/22/2005   . Smokeless tobacco: Never Used   . Alcohol use No   . Drug use: No   . Sexual activity: Not on file     Other Topics Concern   .  Uses Cane No   . Uses Walker No   . Uses Wheelchair No   . Right Hand Dominant Yes   . Left Hand Dominant No   . Ambidextrous No   . Ability To Walk 1 Flight Of Steps Without Sob/Cp Yes   . Routine Exercise Yes     walk   . Ability To Walk 2 Flight Of Steps Without Sob/Cp Yes   . Ability To Do Own Adl's Yes   . Shift Work No   . Unusual Sleep-Wake Schedule No     Social History Narrative       Family History:  Family Medical History:     Problem Relation (Age of Onset)    Bipolar Disorder Daughter    Breast Cancer Paternal Aunt, Paternal Aunt, Paternal 3, Paternal 73, Paternal 56, Other    Cancer Paternal Aunt, Paternal 51, Paternal 21, Paternal 40, Paternal 58, Other (16)    Congestive Heart Failure Father (8)    Coronary Artery Disease Father, Mother (68)    Diabetes Mother    Heart Attack Father, Sister    High Cholesterol Mother    Hypertension Mother, Brother, Sister, Sister    Kidney Disease Sister    Leukemia Paternal Uncle, Paternal Uncle    Stroke Paternal Grandmother    Thyroid Disease Sister              Physical Exam:  All nurse's notes reviewed.  Filed Vitals:    08/02/17 1013   BP: (!) 150/92   Pulse: 84   Resp: 18   Temp: 36.7 C (98.1 F)   SpO2: 96%        Constitutional: NAD. A+Ox3   HENT:    Head: NC AT    Mouth/Throat: Oropharynx is clear and moist.    Eyes: PERRL, EOMI, Conjunctivae without discharge   Neck: Supple.    Cardiovascular: RRR, No murmurs, rubs or gallops.    Pulmonary/Chest: BS equal bilaterally, good air movement. No respiratory distress. No wheezes, rales or chest tenderness.    Abdominal: R suprapubic TTP. No rebound or guarding. BS +. Abdomen soft.   Musculoskeletal: No obvious deformity or swelling noted.     Skin: Warm and dry. No rash, erythema, pallor or cyanosis   Psychiatric: Behavior is normal. Mood and affect congruent.     Neurological: Alert&Ox3. Grossly intact.     Labs:  Results for orders placed or performed during the hospital encounter  of 08/02/17 (from the past 24 hour(s))   CBC/DIFF    Narrative    The following orders were created for panel order CBC/DIFF.  Procedure                               Abnormality         Status                     ---------                               -----------         ------  CBC WITH DIFF[[id number]]                Abnormal            Final result                 Please view results for these tests on the individual orders.   BASIC METABOLIC PANEL, NON-FASTING   Result Value Ref Range    SODIUM 137 136 - 145 mmol/L    POTASSIUM 3.9 3.5 - 5.1 mmol/L    CHLORIDE 105 96 - 111 mmol/L    CO2 TOTAL 24 22 - 32 mmol/L    ANION GAP 8 4 - 13 mmol/L    CALCIUM 10.1 8.5 - 10.2 mg/dL    GLUCOSE 96 65 - 139 mg/dL    BUN 14 8 - 25 mg/dL    CREATININE 1.01 0.49 - 1.10 mg/dL    BUN/CREA RATIO 14 6 - 22    ESTIMATED GFR 56 (L) >59 mL/min/1.75m2   URINALYSIS, MACROSCOPIC AND MICROSCOPIC W/CULTURE REFLEX    Narrative    The following orders were created for panel order URINALYSIS, MACROSCOPIC AND MICROSCOPIC W/CULTURE REFLEX.  Procedure                               Abnormality         Status                     ---------                               -----------         ------                     URINALYSIS, MACROSCOPIC[[id number]]      Abnormal            Final result               URINALYSIS, MICROSCOPIC[[id number]]      Abnormal            Final result                 Please view results for these tests on the individual orders.   CBC WITH DIFF   Result Value Ref Range    WBC 7.8 3.5 - 11.0 x10^3/uL    RBC 5.03 (H) 3.63 - 4.92 x10^6/uL    HGB 14.5 11.2 - 15.2 g/dL    HCT 42.9 33.5 - 45.2 %    MCV 85.4 78.0 - 100.0 fL    MCH 28.9 27.4 - 33.0 pg    MCHC 33.9 32.5 - 35.8 g/dL    RDW 15.2 (H) 12.0 - 15.0 %    PLATELETS 414 140 - 450 x10^3/uL    MPV 7.3 (L) 7.5 - 11.5 fL    NEUTROPHIL % 65 %    LYMPHOCYTE % 26 %    MONOCYTE % 7 %    EOSINOPHIL % 1 %    BASOPHIL % 1 %    NEUTROPHIL # 5.13 1.50 - 7.70 x10^3/uL     LYMPHOCYTE # 2.02 1.00 - 4.80 x10^3/uL    MONOCYTE # 0.53 0.30 - 1.00 x10^3/uL    EOSINOPHIL # 0.09 0.00 - 0.50 x10^3/uL    BASOPHIL # 0.08 0.00 - 0.20 x10^3/uL   URINALYSIS, MACROSCOPIC  Result Value Ref Range    SPECIFIC GRAVITY 1.015 1.005 - 1.030    GLUCOSE Negative Negative mg/dL    PROTEIN Negative Negative mg/dL    BILIRUBIN Negative Negative mg/dL    UROBILINOGEN 2.0  (A) Negative mg/dL    PH 7.0 5.0 - 8.0    BLOOD Small (A) Negative mg/dL    KETONES Negative Negative mg/dL    NITRITE Negative Negative    LEUKOCYTES Moderate (A) Negative WBCs/uL    APPEARANCE Clear Clear    COLOR Normal (Yellow) Normal (Yellow)   URINALYSIS, MICROSCOPIC   Result Value Ref Range    WBCS 81.0 (H) <11.0 /hpf    RBCS 18.0 (H) <6.0 /hpf    BACTERIA Occasional or less Occasional or less /hpf    SQUAMOUS EPITHELIAL CELLS Occasional or less Occasional or less /lpf       Imaging:  Not Obtained          Orders Placed This Encounter   . URINE CULTURE   . CBC/DIFF   . BASIC METABOLIC PANEL, NON-FASTING   . CANCELED: LIPASE   . CANCELED: ALK PHOS (ALKALINE PHOSPHATASE)   . CANCELED: ALT (SGPT)   . CANCELED: AST (SGOT)   . CANCELED: BILIRUBIN, CONJUGATED (DIRECT)   . CANCELED: GAMMA GT   . URINALYSIS, MACROSCOPIC AND MICROSCOPIC W/CULTURE REFLEX   . CBC WITH DIFF   . URINALYSIS, MACROSCOPIC   . URINALYSIS, MICROSCOPIC   . INSERT & MAINTAIN PERIPHERAL IV ACCESS   . NS premix infusion   . phenazopyridine (PYRIDIUM) tablet       Abnormal Lab results:  Labs Reviewed   BASIC METABOLIC PANEL - Abnormal; Notable for the following:        Result Value    ESTIMATED GFR 56 (*)     All other components within normal limits   CBC WITH DIFF - Abnormal; Notable for the following:     RBC 5.03 (*)     RDW 15.2 (*)     MPV 7.3 (*)     All other components within normal limits   URINALYSIS, MACROSCOPIC - Abnormal; Notable for the following:     UROBILINOGEN 2.0  (*)     BLOOD Small (*)     LEUKOCYTES Moderate (*)     All other components within normal  limits   URINALYSIS, MICROSCOPIC - Abnormal; Notable for the following:     WBCS 81.0 (*)     RBCS 18.0 (*)     All other components within normal limits   URINE CULTURE   CBC/DIFF    Narrative:     The following orders were created for panel order CBC/DIFF.  Procedure                               Abnormality         Status                     ---------                               -----------         ------                     CBC WITH VQQV[[id number]]  Abnormal            Final result                 Please view results for these tests on the individual orders.   URINALYSIS, MACROSCOPIC AND MICROSCOPIC W/CULTURE REFLEX    Narrative:     The following orders were created for panel order URINALYSIS, MACROSCOPIC AND MICROSCOPIC W/CULTURE REFLEX.  Procedure                               Abnormality         Status                     ---------                               -----------         ------                     URINALYSIS, MACROSCOPIC[[id number]]      Abnormal            Final result               URINALYSIS, MICROSCOPIC[[id number]]      Abnormal            Final result                 Please view results for these tests on the individual orders.       EKG: not obtained.     Plan: Appropriate labs and imaging ordered. Medical Records reviewed.    Therapy/Procedures/Course/MDM:   Patient's vitals were stable throughout visit.    Imaging remarkable for: not obtained   Lab results as shown above.   Patient received: Pyridium  Results discussed with patient.  she had improvement with initial ED management. she was given the opportunity to ask questions.      Consults:    none     Impression:   Encounter Diagnosis   Name Primary?   . Acute cystitis with hematuria Yes       Disposition:  Discharged     Following the above history, physical exam, and studies, the patient was deemed stable and suitable for discharge.   she will follow up with PCP as soon as possible.     No medical therapy was  prescribed.   It was advised that the patient return to the ED with any new, concerning or worsening symptoms and follow up as directed.    The patient verbalized understanding of all instructions and had no further questions or concerns.     I am scribing for, and in the presence of, Emelda Brothers, APRN, ENP, [facility], for services provided on 08/02/2017.  Christian. D. Aris Everts     //Christian. Bethann Berkshire, SCRIBE-08/02/2017    I personally performed the services described in this documentation, as scribed  in my presence, and it is both accurate  and complete.    Aline August, FNP  The Physician was available in the Emergency Department and did not participate in care.  Aline August, FNP  08/04/2017, 08:58

## 2017-08-03 LAB — URINE CULTURE: URINE CULTURE: NO GROWTH

## 2017-08-03 MED ORDER — TRIAMCINOLONE ACETONIDE 0.5 % EX OINT
0.5 % | CUTANEOUS | 3 refills | Status: AC
Start: 2017-08-03 — End: 2017-08-10

## 2017-08-03 NOTE — Telephone Encounter (Signed)
I sent triamcinolone. Please let her know. Thanks.

## 2017-08-03 NOTE — Telephone Encounter (Signed)
If she has not tried betamethasone, fluocinolone let me know but if she has tried these and they have failed also, please redo PA to see if we can get approval. Thanks.

## 2017-08-03 NOTE — Telephone Encounter (Signed)
Halobetasol denied.  Must try triamcinolone, betamethasone or fluocinolone.

## 2017-08-03 NOTE — Telephone Encounter (Signed)
Pt aware. Said she has already tried triamcinolone and it doesn't work. The Halobetasol is the only thing that works for her.

## 2017-08-04 ENCOUNTER — Encounter: Attending: Family Medicine

## 2017-08-04 MED ORDER — BETAMETHASONE VALERATE 0.1 % EX CREA
0.1 % | CUTANEOUS | 0 refills | Status: AC
Start: 2017-08-04 — End: 2017-08-11

## 2017-08-04 NOTE — Telephone Encounter (Signed)
I sent in betamethasone. Thanks.

## 2017-08-04 NOTE — Telephone Encounter (Signed)
Pt has not tried betamethasone or fluocinolone. She is willing to try one of these as long as it does not have blue dye in it.

## 2017-08-15 ENCOUNTER — Ambulatory Visit: Admit: 2017-08-15 | Discharge: 2017-08-15 | Payer: MEDICAID | Attending: Family Medicine

## 2017-08-15 DIAGNOSIS — R300 Dysuria: Secondary | ICD-10-CM

## 2017-08-15 LAB — POCT URINALYSIS DIPSTICK W/O MICROSCOPE (AUTO)
Bilirubin, UA: NEGATIVE
Glucose, UA POC: NEGATIVE
Ketones, UA: NEGATIVE
Nitrite, UA: NEGATIVE
Protein, UA POC: NEGATIVE
Spec Grav, UA: 1.02
Urobilinogen, UA: 0.2
pH, UA: 5.5

## 2017-08-15 MED ORDER — SULFAMETHOXAZOLE-TRIMETHOPRIM 800-160 MG PO TABS
800-160 MG | ORAL_TABLET | Freq: Two times a day (BID) | ORAL | 0 refills | Status: AC
Start: 2017-08-15 — End: 2017-08-22

## 2017-08-15 NOTE — Progress Notes (Signed)
Subjective:     Patient: Mariah MccreedySusan Keatts is a 61 y.o. female     HPI     Health Maintenance Due   Topic Date Due   . HIV screen  05/28/1972   . Breast cancer screen  05/29/2007   . Shingles Vaccine (1 of 2 - 2 Dose Series) 05/29/2007   . Colon cancer screen colonoscopy  05/29/2007   . Flu vaccine (1) 06/25/2017     LAST COLONOSCOPY - 2010, NORMAL. [place] IN [place]  LAST MAM[place]GRAM - April 2018, N[place]AL[place]     HYPERTENSION   Compliant with medication, low sodium diet, exercise.  No chest pain, no SOB, no edema, has been having a recent headache, no dizziness  Lab Results   Component Value Date    NA 142 05/04/2017    K 3.8 05/04/2017    CL 105 05/04/2017    CO2 26 05/04/2017    BUN 14 05/04/2017    CREATININE 0.88 05/04/2017    GLUCOSE 92 05/04/2017    CALCIUM 10.2 05/04/2017        GERD = controlled on present medication.     Depression - compliant, stable on current medications.     Dysuria, frequency x 2 weeks. [place] [place] for U[place] with macrobid. Completed the course of antibiotics and felt a little better but then symptoms returned. Has been having fever and chills as well. No nausea or vomiting.     Review of Systems   Constitutional: Positive for chills and fever.   Respiratory: Negative for chest tightness and shortness of breath.    Cardiovascular: Negative for chest pain and palpitations.   Gastrointestinal: Positive for abdominal pain and constipation. Negative for nausea and vomiting.   Genitourinary: Positive for decreased urine volume, difficulty urinating, dysuria and frequency. Negative for hematuria.   Neurological: Positive for headaches. Negative for dizziness.        Allergies   Allergen Reactions   . Duloxetine      Rash on feet and hands   . Atorvastatin      Muscle pain   . Blue Dyes (Parenteral)      Told to avoid due to patch test result by [facility]   . Blueberry Flavor    . Cymbalta [Duloxetine Hcl]    . Nickel    . Nsaids    . Codeine  Itching     RASH   . Meloxicam Nausea And Vomiting   . Naproxen Nausea And Vomiting     Current Outpatient Prescriptions on File Prior to Visit   Medication Sig Dispense Refill   . hydrochlorothiazide (MICROZIDE) 12.5 MG capsule Take 1 capsule by mouth daily 30 capsule 5   . ranitidine (ZANTAC) 15 MG/ML syrup Take 150 mg by mouth     . oxyCODONE-acetaminophen (PERCOCET) 5-325 MG per tablet TAKE 1 TABLET BY MOUTH EVERY 6 HOURS  0   . nystatin (MYCOSTATIN) 100000 UNIT/GM powder Apply once or twice daily to PREVENT rash under breasts     . nitroGLYCERIN (NITROSTAT) 0.4 MG SL tablet Place 0.4 mg under the tongue     . metroNIDAZOLE (METROCREAM) 0.75 % cream Apply topically     . Emollient (AQUAPHOR EX) Apply topically     . Neomycin-Polymyxin-Dexameth (MAXITROL) 0.1 % OINT Apply to eye     . FLUoxetine (PROZAC) 20 MG tablet Take 20 mg by mouth     . doxycycline hyclate (VIBRAMYCIN)  100 MG capsule Take 100 mg by mouth     . azelaic acid (AZELEX) 20 % cream Apply topically     . aspirin 81 MG chewable tablet Take 81 mg by mouth daily     . fluticasone (FLONASE) 50 MCG/ACT nasal spray 2 sprays by Nasal route daily     . pantoprazole (PROTONIX) 40 MG tablet Take 40 mg by mouth daily     . gabapentin (NEURONTIN) 300 MG capsule Take 600 mg by mouth 3 times daily..       No current facility-administered medications on file prior to visit.       Past Medical History:   Diagnosis Date   . Depression    . GERD (gastroesophageal reflux disease)    . Hypertension    . PTSD (post-traumatic stress disorder)    . PTSD (post-traumatic stress disorder) 11/2016   . Squamous carcinoma    . Thyroid goiter       Past Surgical History:   Procedure Laterality Date   . APPENDECTOMY     . EYE SURGERY      bilateral    . HAND SURGERY      R   . HYSTERECTOMY, VAGINAL     . TONSILLECTOMY AND ADENOIDECTOMY     . TUMOR REMOVAL      neck      Family History   Problem Relation Age of Onset   . Heart Failure Mother    . Heart Attack Mother         x3    . High Blood Pressure Mother    . Diabetes Mother    . Arthritis Mother    . Heart Failure Father    . Arthritis Father    . High Blood Pressure Sister    . High Blood Pressure Brother    . Breast Cancer Paternal Aunt    . Cancer Paternal Uncle         leukemia      Social History   Substance Use Topics   . Smoking status: Former Smoker     Packs/day: 2.00     Years: 40.00     Quit date: 2007   . Smokeless tobacco: Never Used   . Alcohol use No          Objective:     Vitals:    08/15/17 1018   BP: 124/70   Site: Left Upper Arm   Position: Sitting   Cuff Size: Medium Adult   Pulse: 93   Temp: 97.9 F (36.6 C)   TempSrc: Temporal   SpO2: 97%   Weight: 192 lb 1.6 oz (87.1 kg)   Height: 5\' 5"  (1.651 m)       Physical Exam   Constitutional: She is oriented to person, place, and time. She appears well-nourished. No distress.   Cardiovascular: Normal rate, regular rhythm and normal heart sounds.  Exam reveals no gallop and no friction rub.    No murmur heard.  Pulmonary/Chest: Effort normal and breath sounds normal. No respiratory distress. She has no wheezes. She has no rales.   Abdominal: Soft. She exhibits no distension and no mass. There is tenderness (to suprapubic area and flanks. ). There is no rebound and no guarding.   Musculoskeletal: She exhibits no edema.   Neurological: She is alert and oriented to person, place, and time.   Skin: Skin is warm and dry. No erythema.   Psychiatric: She has a normal mood and  affect. Her behavior is normal.   Nursing note and vitals reviewed.      Assessment      1. Dysuria    2. Recurrent major depressive disorder, in partial remission (HCC)    3. Essential hypertension    4. Gastroesophageal reflux disease, esophagitis presence not specified         Plan      1. Dysuria  Office urine dip with blood and leukocytes.   Rx with bactrim course x 7 days.   Urine sent for culture.   - POCT Urinalysis No Micro (Auto)  - sulfamethoxazole-trimethoprim (BACTRIM DS) 800-160 MG per  tablet; Take 1 tablet by mouth 2 times daily for 7 days  Dispense: 14 tablet; Refill: 0    2. Recurrent major depressive disorder, in partial remission (HCC)  Stable  Continue present medications    3. Essential hypertension  Controlled  Continue present medication    4. Gastroesophageal reflux disease, esophagitis presence not specified  Controlled   Continue present medication.     Loreli Slot, MD  08/15/17  11:47 AM

## 2017-08-15 NOTE — Patient Instructions (Signed)
Patient Education          trimethoprim  Pronunciation:  trye METH oh prim  Brand:  Primsol  What is the most important information I should know about trimethoprim?  You should not use trimethoprim if you are allergic to it, or if you have a certain type of anemia caused by a folate (folic acid) deficiency.  Before using trimethoprim, tell your doctor if you have kidney disease, liver disease, or a folic acid deficiency.  Take this medicine for the full prescribed length of time. Your symptoms may improve before the infection is completely cleared. Skipping doses may also increase your risk of further infection that is resistant to antibiotics. Trimethoprim will not treat a viral infection such as the common cold or flu.  Avoid exposure to sunlight or tanning beds. Trimethoprim can make you sunburn more easily. Wear protective clothing and use sunscreen (SPF 30 or higher) when you are outdoors.  What is trimethoprim?  Trimethoprim is an antibiotic that fights bacteria in the body.  Trimethoprim is used to treat bladder or kidney infections, or ear infections caused by certain bacteria.  Trimethoprim may also be used for purposes not listed in this medication guide.  What should I discuss with my healthcare provider before taking trimethoprim?  You should not use trimethoprim if you are allergic to it, or if you have a certain type of anemia caused by a folate (folic acid) deficiency.  To make sure trimethoprim is safe for you, tell your doctor if you have any of these conditions:   a folate (folic acid) deficiency;   kidney disease;   liver disease; or   fever, sore throat, flu symptoms, pale skin, or purple or red pinpoint spots under your skin.  FDA pregnancy category C. It is not known whether trimethoprim will harm an unborn baby. Tell your doctor if you are pregnant or plan to become pregnant while using this medication.  Trimethoprim can pass into breast milk and may harm a nursing baby. Do not use this  medication without telling your doctor if you are breast-feeding a baby.  Trimethoprim should not be given to a child younger than 37 months old.  How should I take trimethoprim?  Follow the directions on your prescription label. Do not take this medicine in larger or smaller amounts or for longer than recommended.  Take your medicine with a full glass of water.  Measure liquid medicine with a special dose-measuring spoon or cup, not a regular table spoon. If you do not have a dose-measuring device, ask your pharmacist for one.  Take this medicine for the full prescribed length of time. Your symptoms may improve before the infection is completely cleared. Skipping doses may also increase your risk of further infection that is resistant to antibiotics. Trimethoprim will not treat a viral infection such as the common cold or flu.  This medication can cause unusual results with certain medical tests. Tell any doctor who treats you that you are using trimethoprim.  Store at room temperature away from moisture, heat, and light.  What happens if I miss a dose?  Take the missed dose as soon as you remember. Skip the missed dose if it is almost time for your next scheduled dose. Do not take extra medicine to make up the missed dose.  What happens if I overdose?  Seek emergency medical attention or call the Poison Help line at 1-505-344-5365.  Trimethoprim overdose can occur if you accidentally take too much at  one time. This can cause nausea, vomiting, dizziness, confusion, or depression.  Overdose can also occur slowly over a long period of time if your daily doses are too high. This can cause severe forms of some of the side effects listed in this medication guide.  What should I avoid while taking trimethoprim?  Avoid exposure to sunlight or tanning beds. Trimethoprim can make you sunburn more easily. Wear protective clothing and use sunscreen (SPF 30 or higher) when you are outdoors.  What are the possible side effects  of trimethoprim?  Get emergency medical help if you have any of these signs of an allergic reaction: hives; difficult breathing; swelling of your face, lips, tongue, or throat.  Call your doctor at once if you have a serious side effect such as:   fever, chills, body aches, flu symptoms, sores in your mouth and throat;   pale skin, easy bruising, unusual bleeding (nose, mouth, vagina, or rectum), purple or red pinpoint spots under your skin;   high potassium (slow heart rate, weak pulse, muscle weakness, tingly feeling); or   severe headache with muscle cramps, confusion, weakness, loss of coordination, feeling unsteady, and weak or shallow breathing.  Other common side effects may include:   stomach pain, vomiting, diarrhea;   sore or swollen tongue; or   mild itching or skin rash.  This is not a complete list of side effects and others may occur. Call your doctor for medical advice about side effects. You may report side effects to FDA at 1-800-FDA-1088.  What other drugs will affect trimethoprim?  Tell your doctor about all medications you use, and those you start or stop using during your treatment with trimethoprim, especially:   a diuretic (water pill);   dofetilide (Tikosyn);   heart or blood pressure medication such as procainamide (Procan, Pronestyl) and others;   leucovorin (folinic acid);   methotrexate (Rheumatrex, Trexall); or   phenytoin (Dilantin).  This list is not complete. Other drugs may interact with trimethoprim, including prescription, over-the-counter, vitamin, and herbal products. Not all possible interactions are listed in this medication guide.  Where can I get more information?  Your pharmacist can provide more information about trimethoprim.    Remember, keep this and all other medicines out of the reach of children, never share your medicines with others, and use this medication only for the indication prescribed.  Every effort has been made to ensure that the information  provided by [facility] is accurate, up-to-date, and complete, but no guarantee is made to that effect. Drug information contained herein may be time sensitive. [employer] information has been compiled for use by healthcare practitioners and consumers in the [place] and therefore [employer] does not warrant that uses outside of the [place] are appropriate, unless specifically indicated otherwise. [employer]'s drug information does not endorse drugs, diagnose patients or recommend therapy. [employer]'s drug information is an Scientist, research (physical sciences) to assist licensed healthcare practitioners in caring for their patients and/or to serve consumers viewing this service as a supplement to, and not a substitute for, the expertise, skill, knowledge and judgment of healthcare practitioners. The absence of a warning for a given drug or drug combination in no way should be construed to indicate that the drug or drug combination is safe, effective or appropriate for any given patient. [employer] does not assume any responsibility for any aspect of healthcare administered with the aid of information [employer] provides. The information contained herein is not intended to cover all possible  uses, directions, precautions, warnings, drug interactions, allergic reactions, or adverse effects. If you have questions about the drugs you are taking, check with your doctor, nurse or pharmacist.  Copyright 1996-2018 [employer] Version: 5.01. Revision date: 09/09/2011.  Care instructions adapted under license by [facility]. If you have questions about a medical condition or this instruction, always ask your healthcare professional. [employer] disclaims any warranty or liability for your use of this information.

## 2017-08-17 LAB — CULTURE, URINE: Urine Culture, Routine: NO GROWTH

## 2017-11-07 MED ORDER — FLUTICASONE PROPIONATE 50 MCG/ACT NA SUSP
50 MCG/ACT | Freq: Every day | NASAL | 0 refills | Status: DC
Start: 2017-11-07 — End: 2017-12-05

## 2017-11-07 MED ORDER — CETIRIZINE HCL 10 MG PO TABS
10 MG | ORAL_TABLET | Freq: Every day | ORAL | 0 refills | Status: AC
Start: 2017-11-07 — End: 2017-11-17

## 2017-11-07 NOTE — Telephone Encounter (Signed)
I sent in flonase with cetirizine to take daily to see if it will help to dry up any fluid behind the ears. Thanks.

## 2017-11-07 NOTE — Telephone Encounter (Signed)
Pt is c/o ear pain   Pt had an ear infection and was on amoxicillin 875 mg for 10 days and has finished it. Pt has also been taking advil for pain when needed and took mucinex all last week. Pt states that she is still having a lot of pressure and when she blows her nose it feels like her ears are popping.   Pt is asking if there is anything we can advise to help with symptoms   PCP to advise

## 2017-11-07 NOTE — Telephone Encounter (Signed)
Pt informed

## 2017-12-05 MED ORDER — FLUTICASONE PROPIONATE 50 MCG/ACT NA SUSP
50 MCG/ACT | NASAL | 11 refills | Status: AC
Start: 2017-12-05 — End: ?

## 2017-12-12 ENCOUNTER — Emergency Department (HOSPITAL_COMMUNITY): Payer: MEDICAID

## 2017-12-12 ENCOUNTER — Emergency Department (EMERGENCY_DEPARTMENT_HOSPITAL): Payer: MEDICAID

## 2017-12-12 ENCOUNTER — Emergency Department
Admission: EM | Admit: 2017-12-12 | Discharge: 2017-12-12 | Disposition: A | Discharge status: Home / Self Care | Payer: MEDICAID | Attending: Family | Admitting: Family

## 2017-12-12 DIAGNOSIS — K5790 Diverticulosis of intestine, part unspecified, without perforation or abscess without bleeding: Secondary | ICD-10-CM | POA: Insufficient documentation

## 2017-12-12 DIAGNOSIS — Z888 Allergy status to other drugs, medicaments and biological substances status: Secondary | ICD-10-CM | POA: Insufficient documentation

## 2017-12-12 DIAGNOSIS — Z886 Allergy status to analgesic agent status: Secondary | ICD-10-CM | POA: Insufficient documentation

## 2017-12-12 DIAGNOSIS — N390 Urinary tract infection, site not specified: Principal | ICD-10-CM | POA: Insufficient documentation

## 2017-12-12 DIAGNOSIS — M542 Cervicalgia: Secondary | ICD-10-CM | POA: Insufficient documentation

## 2017-12-12 DIAGNOSIS — Z7982 Long term (current) use of aspirin: Secondary | ICD-10-CM | POA: Insufficient documentation

## 2017-12-12 DIAGNOSIS — Z792 Long term (current) use of antibiotics: Secondary | ICD-10-CM | POA: Insufficient documentation

## 2017-12-12 DIAGNOSIS — R109 Unspecified abdominal pain: Secondary | ICD-10-CM

## 2017-12-12 DIAGNOSIS — R221 Localized swelling, mass and lump, neck: Secondary | ICD-10-CM

## 2017-12-12 DIAGNOSIS — E785 Hyperlipidemia, unspecified: Secondary | ICD-10-CM | POA: Insufficient documentation

## 2017-12-12 DIAGNOSIS — Z885 Allergy status to narcotic agent status: Secondary | ICD-10-CM | POA: Insufficient documentation

## 2017-12-12 DIAGNOSIS — I2510 Atherosclerotic heart disease of native coronary artery without angina pectoris: Secondary | ICD-10-CM

## 2017-12-12 DIAGNOSIS — Z79899 Other long term (current) drug therapy: Secondary | ICD-10-CM | POA: Insufficient documentation

## 2017-12-12 DIAGNOSIS — Z87891 Personal history of nicotine dependence: Secondary | ICD-10-CM | POA: Insufficient documentation

## 2017-12-12 DIAGNOSIS — I7090 Unspecified atherosclerosis: Secondary | ICD-10-CM | POA: Insufficient documentation

## 2017-12-12 LAB — HCG, SERUM QUALITATIVE, PREGNANCY: PREGNANCY, SERUM QUALITATIVE: NEGATIVE

## 2017-12-12 LAB — HEPATIC FUNCTION PANEL
ALBUMIN: 3.8 g/dL (ref 3.4–4.8)
ALKALINE PHOSPHATASE: 60 U/L (ref <150)
ALT (SGPT): 18 U/L (ref <55)
AST (SGOT): 20 U/L (ref 8–41)
BILIRUBIN DIRECT: 0.1 mg/dL (ref <0.3)
BILIRUBIN TOTAL: 0.3 mg/dL (ref 0.3–1.3)
PROTEIN TOTAL: 7.6 g/dL (ref 6.0–8.0)

## 2017-12-12 LAB — CBC WITH DIFF
BASOPHIL #: 0.07 x10ˆ3/uL (ref 0.00–0.20)
BASOPHIL %: 1 %
EOSINOPHIL #: 0.18 x10ˆ3/uL (ref 0.00–0.50)
EOSINOPHIL %: 2 %
HCT: 42.8 % (ref 33.5–45.2)
HGB: 14.6 g/dL (ref 11.2–15.2)
LYMPHOCYTE #: 2.31 x10ˆ3/uL (ref 1.00–4.80)
LYMPHOCYTE %: 28 %
MCH: 29.5 pg (ref 27.4–33.0)
MCHC: 34.1 g/dL (ref 32.5–35.8)
MCV: 86.5 fL (ref 78.0–100.0)
MONOCYTE #: 0.59 x10ˆ3/uL (ref 0.30–1.00)
MONOCYTE %: 7 %
MPV: 7 fL — ABNORMAL LOW (ref 7.5–11.5)
NEUTROPHIL #: 5.04 x10ˆ3/uL (ref 1.50–7.70)
NEUTROPHIL %: 62 %
PLATELETS: 404 x10ˆ3/uL (ref 140–450)
RBC: 4.95 x10ˆ6/uL — ABNORMAL HIGH (ref 3.63–4.92)
RDW: 14.5 % (ref 12.0–15.0)
RDW: 14.5 % (ref 12.0–15.0)
WBC: 8.2 x10ˆ3/uL (ref 3.5–11.0)

## 2017-12-12 LAB — BASIC METABOLIC PANEL
ANION GAP: 9 mmol/L (ref 4–13)
BUN/CREA RATIO: 15 (ref 6–22)
BUN: 14 mg/dL (ref 8–25)
CALCIUM: 10.1 mg/dL (ref 8.5–10.2)
CALCIUM: 10.1 mg/dL (ref 8.5–10.2)
CHLORIDE: 103 mmol/L (ref 96–111)
CO2 TOTAL: 26 mmol/L (ref 22–32)
CREATININE: 0.94 mg/dL (ref 0.49–1.10)
ESTIMATED GFR: >59 mL/min/1.73mˆ2 (ref >59)
GLUCOSE: 89 mg/dL (ref 65–139)
POTASSIUM: 3.7 mmol/L (ref 3.5–5.1)
SODIUM: 138 mmol/L (ref 136–145)

## 2017-12-12 LAB — DRUG SCREEN, NO CONFIRMATION, URINE
AMPHETAMINES URINE: NEGATIVE
BARBITURATES URINE: NEGATIVE
BENZODIAZEPINES URINE: NEGATIVE
BUPRENORPHINE URINE: NEGATIVE
CANNABINOIDS URINE: NEGATIVE
CREATININE RANDOM URINE: 67 mg/dL
ECSTASY/MDMA URINE: NEGATIVE
METHADONE URINE: NEGATIVE
OPIATES URINE (LOW CUTOFF): NEGATIVE
OXIDANT-ADULTERATION: NEGATIVE
PH-ADULTERATION: 5.6 (ref 4.5–9.0)
SPECIFIC GRAVITY-ADULTERATION: 1.023 g/mL (ref 1.005–1.030)

## 2017-12-12 LAB — URINALYSIS, MICROSCOPIC
RBCS: 22 /hpf — ABNORMAL HIGH (ref <6.0)
WBCS: 83 /hpf — ABNORMAL HIGH (ref <11.0)

## 2017-12-12 LAB — LIPASE: LIPASE: 62 U/L (ref 10–80)

## 2017-12-12 LAB — HIV1/HIV2 SCREEN, COMBINED ANTIGEN AND ANTIBODY: HIV SCREEN, COMBINED ANTIGEN & ANTIBODY: NEGATIVE

## 2017-12-12 LAB — URINALYSIS, MACROSCOPIC
BILIRUBIN: NEGATIVE mg/dL
COLOR: NORMAL
GLUCOSE: NEGATIVE mg/dL
KETONES: NEGATIVE mg/dL
NITRITE: NEGATIVE
PH: 5 (ref 5.0–8.0)
PROTEIN: NEGATIVE mg/dL
PROTEIN: NEGATIVE mg/dL
SPECIFIC GRAVITY: 1.014 (ref 1.005–1.030)
UROBILINOGEN: NEGATIVE mg/dL

## 2017-12-12 LAB — HEPATITIS C ANTIBODY SCREEN WITH REFLEX TO HCV PCR: HCV ANTIBODY QUALITATIVE: NEGATIVE

## 2017-12-12 MED ORDER — KETOROLAC 30 MG/ML (1 ML) INJECTION SOLUTION
30.00 mg | INTRAMUSCULAR | Status: AC
Start: 2017-12-12 — End: 2017-12-12
  Administered 2017-12-12: 30 mg via INTRAVENOUS
  Filled 2017-12-12: qty 1

## 2017-12-12 MED ORDER — DEXTROSE 5 % IN WATER (D5W) INTRAVENOUS SOLUTION
1.0000 g | INTRAVENOUS | Status: AC
Start: 2017-12-12 — End: 2017-12-12
  Administered 2017-12-12: 1 g via INTRAVENOUS
  Administered 2017-12-12: 0 g via INTRAVENOUS

## 2017-12-12 MED ORDER — IOVERSOL 320 MG IODINE/ML INTRAVENOUS SOLUTION
50.00 mL | INTRAVENOUS | Status: AC
Start: 2017-12-12 — End: 2017-12-12
  Administered 2017-12-12: 50 mL via INTRAVENOUS

## 2017-12-12 MED ORDER — IOVERSOL 320 MG IODINE/ML INTRAVENOUS SOLUTION
100.00 mL | INTRAVENOUS | Status: AC
Start: 2017-12-12 — End: 2017-12-12
  Administered 2017-12-12: 100 mL via INTRAVENOUS

## 2017-12-12 NOTE — ED Nurses Note (Signed)
Pt's IV removed with catheter intact. Pt given d/c instructions by RN.     Woody Seller [place], LPN  4/[id number]/[id number], 70:48

## 2017-12-12 NOTE — Discharge Instructions (Signed)
Please return to the ED with any new and/or worsening of your symptoms.    Please use over-the-counter medications as indicated in as directed for symptomatic relief of your discomfort.    Please take prescribed antibiotic as indicated in as directed.    Please follow up with your PCP as needed within 1 week due to recent ED visit.    Please follow up with ENT service and urology services when appointment made.  Referrals placed.    Please follow-up with your OBGYN in your hometown when possible as you have declined pelvic exam and workup in today's visit.    Please follow all discharge instructions including return precautions.    Thank you.

## 2017-12-12 NOTE — ED Provider Notes (Signed)
Department of Emergency Medicine  HPI - 12/12/2017    Attending Provider: Dr. Delight Ovens   Advanced Practice Provider: Franco Nones, APRN, NP-C    Chief Complaint:   abdominal pain    History of Present Illness:   [name], 62 y.o. female   Patient reports to the ED via POV with c/o c/o intermittent lower abdominal pain since October 2018. States she recently finished a 10 day course antibiotics for similar symptoms. Reports she has edema with R sided jaw, ear, and neck pain for the past 2 days. 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 Patient complains of dysuria, hematuria (notes unsure where her bleeding is coming from) and pain when urinating. Denies fever, chills, nausea, vomiting or any other issue or concerns at this time.  PMHx includes GERD, HLD, Minor CAD. PSHx includes cholecystectomy, appendectomy, hysterectomy.     Review of Systems:   Constitutional: No fever, chills, or weakness   Skin: No rashes or diaphoresis   HENT: No congestion, headaches +R jaw pain with edema +R ear with edema pain +R neck pain with edema    Eyes: No vision changes, discharge   Cardio: No chest pain, palpitations or leg swelling    Respiratory: No cough, wheezing or SOB   GI:  No nausea, vomiting, diarrhea, constipation +abdominal pain   GU:  +dysuria +hematuria +painful urination   MSK: No joint or back pain   Neuro: No loss of sensation, focal deficits,or LOC  All other symptoms reviewed and are negative    Medications:  Prior to Admission Medications   Prescriptions Last Dose Informant Patient Reported? Taking?   Azelaic Acid 20 % Cream prn  No No   Sig: Apply topically Twice daily   Clobetasol (TEMOVATE) 0.05 % Ointment prm  No No   Sig: Apply topically Twice daily To rash on hands and feet   FLUoxetine (PROZAC) 20 mg Oral Tablet 12/12/2017 at Unknown time  No Yes   Sig: Take 1 Tab (20 mg total) by mouth Once a day   HYDROcodone-acetaminophen (NORCO) 5-325  mg Oral Tablet   No No   Sig: Take 1 Tab by mouth Every 4 hours as needed for Pain   Halobetasol Propionate (ULTRAVATE) 0.05 % Cream prn  No No   Sig: Apply  topically. Three times a week   Metronidazole (METROCREAM) 0.75 % Cream prn  No No   Sig: Apply topically Twice daily   aspirin 81 mg Oral Tablet, Chewable 12/12/2017 at Unknown time  No Yes   Sig: Take 1 Tab (81 mg total) by mouth Once a day   doxycycline hyclate (VIBRAMYCIN) 100 mg Oral Capsule   No No   Sig: Take 1 Cap (100 mg total) by mouth Twice daily   gabapentin (NEURONTIN) 300 mg Oral Capsule 12/12/2017  No Yes   Sig: Take 2 Caps (600 mg total) by mouth Three times a day   hydroCHLOROthiazide (HYDRODIURIL) 12.5 mg Oral Tablet 12/12/2017 at Unknown time  No Yes   Sig: Take 1 Tab (12.5 mg total) by mouth Once a day   mineral oil-hydrophil petrolat (AQUAPHOR) Ointment prn  No No   Sig: by Apply Topically route Four times a day as needed   neomycin-polymyxin-dexamethasone (MAXITROL) 3.5 mg/g-10,000 unit/g-0.1 % Ophthalmic Ointment prn  No No   Sig: Instill into right eye Four times a day   nitroGLYCERIN (NITROSTAT) 0.4 mg Sublingual Tablet, Sublingual prn  No No  Sig: 1 Tab (0.4 mg total) by Sublingual route Every 5 minutes as needed for Chest pain for up to 3 doses for 3 doses over 15 minutes   nystatin (NYSTOP) 100,000 unit/gram Powder prn  No No   Sig: Apply once or twice daily to PREVENT rash under breasts   pantoprazole (PROTONIX) 40 mg Oral Tablet, Delayed Release (E.C.) 12/12/2017 at Unknown time  No Yes   Sig: Take 1 Tab (40 mg total) by mouth Every morning before breakfast   phenazopyridine (PYRIDIUM) 100 mg Oral Tablet   No No   Sig: Take 1 Tab (100 mg total) by mouth Three times a day as needed for Pain   raNITIdine (ZANTAC) 15 mg/mL Oral Syrup 12/11/2017 at Unknown time  No Yes   Sig: Take 10 mL (150 mg total) by mouth Every night   triamcinolone acetonide (ARISTOCORT A) 0.1 % Ointment prn  No No   Sig: by Apply Topically route Twice daily         Facility-Administered Medications: None       Allergies:  Allergies   Allergen Reactions   . Cymbalta [Duloxetine]      Rash on feet and hands   . Blue Dye      Told to avoid due to patch test result by Simone Curia   . Lipitor [Atorvastatin]      Muscle pain   . Nickel    . Codeine Itching     RASH   . Mobic [Meloxicam] Nausea/ Vomiting   . Naprosyn [Naproxen] Nausea/ Vomiting       Past Medical History:  Past Medical History:   Diagnosis Date   . Arthritis    . Depression    . GERD (gastroesophageal reflux disease)     controlled w/med   . Heart murmur 1979    benign   . Hyperlipidemia LDL goal < 100 08/29/2013   . MINOR CAD (coronary artery disease) 11/10/2012    no treatment other than cholesterol medications; follows regularly with Dr. Rodney Booze   . Multinodular goiter    . Rheumatic fever 17    no complications   . Shortness of breath     on exertion   . Squamous cell carcinoma 02/06/2015    right side of neck   . Vaginal prolapse 2012    surgery improved significantly   . Wears glasses            Past Surgical History:  Past Surgical History:   Procedure Laterality Date   . COLONOSCOPY  01/06/09    COLONOSCOPY performed by Loletta Parish F at [place]   . GASTROSCOPY  03/05/2010    GASTROSCOPY performed by Jocelyn Lamer at [place]   . GASTROSCOPY WITH BIOPSY  03/05/2010    GASTROSCOPY WITH BIOPSY performed by Jocelyn Lamer at [place]   . HX ADENOIDECTOMY     . HX ANKLE FRACTURE TX  2007    left distal fibula, casted   . HX APPENDECTOMY     . HX CHOLECYSTECTOMY     . HX COLONOSCOPY     . HX CYSTOCELE REPAIR  09/24/09   . HX HAND SURGERY  2012    for Carpal tunnel    . HX OOPHORECTOMY      left ovary removed   . HX TONSILLECTOMY     . HX TOTAL VAGINAL HYSTERECTOMY  1979   . HX WRIST FRACTURE TX  2007    [facility]  injury   . PB REVISE ULNAR NERVE AT ELBOW  1979   . PB UPPER GI ENDOSCOPY,BIOPSY  12/19/07    patulous GE junction zone, erythema, nonerosive GERD           Social History:  Social History     Socioeconomic  History   . Marital status: Divorced     Spouse name: Not on file   . Number of children: Not on file   . Years of education: Not on file   . Highest education level: Not on file   Social Needs   . Financial resource strain: Not on file   . Food insecurity - worry: Not on file   . Food insecurity - inability: Not on file   . Transportation needs - medical: Not on file   . Transportation needs - non-medical: Not on file   Occupational History   . 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 Smoking status: Former Smoker     Packs/day: 2.00     Years: 40.00     Pack years: 80.00     Types: Cigarettes     Last attempt to quit: 01/22/2005     Years since quitting: 12.8   . Smokeless tobacco: Never Used   Substance and Sexual Activity   . Alcohol use: No     Alcohol/week: 0.0 oz   . Drug use: No   . Sexual activity: Not on file   Other Topics Concern   . Abuse/Domestic Violence Not Asked   . Breast Self Exam Not Asked   . Caffeine Concern Not Asked   . Calcium intake adequate Not Asked   . Computer Use Not Asked   . Exercise Concern Not Asked   . Helmet Use Not Asked   . Seat Belt Not Asked   . Special Diet Not Asked   . Sunscreen used Not Asked   . Uses Cane No   . Uses walker No   . Uses wheelchair No   . Right hand dominant Yes   . Left hand dominant No   . Ambidextrous No   . Ability to Walk 1 Flight of Steps without SOB/CP Yes   . Routine Exercise Yes     Comment: walk   . Ability to Walk 2 Flight of Steps without SOB/CP Yes   . Unable to Ambulate Not Asked   . Total Care Not Asked   . Ability To Do Own ADL's Yes   . Uses Walker Not Asked   . Other Activity Level Not Asked   . Uses Cane Not Asked   . Drives Not Asked   . Shift Work No   . Unusual Sleep-Wake Schedule No   Social History Narrative   . Not on file       Family History:  Family Medical History:     Problem Relation (Age of Onset)    Bipolar Disorder Daughter    Breast Cancer Paternal Aunt,  Paternal Aunt, Paternal Aunt, Paternal Aunt, Paternal 9, Other    Cancer Paternal Aunt, Paternal 10, Paternal 29, Paternal 73, Paternal 74, Other (4)    Congestive Heart Failure Father (9)    Coronary Artery Disease Father, Mother (70)    Diabetes Mother    Heart Attack Father, Sister    High Cholesterol Mother    Hypertension Mother, Brother, Sister, Sister    Kidney Disease  Sister    Leukemia Paternal Uncle, Paternal Uncle    Stroke Paternal Grandmother    Thyroid Disease Sister              Physical Exam:  All nurse's notes reviewed.  Filed Vitals:    12/12/17 1341 12/12/17 1536 12/12/17 1812   BP: 124/82 139/85 (!) 140/85   Pulse: 96 70 52   Resp: 20 18 16    Temp: 36.1 C (97 F)     SpO2: 97% 98% 98%        Constitutional: NAD. A+Ox3   HENT:    Head: TTP and edema noted to the R jaw, R ear, R neck.    Mouth/Throat: Oropharynx is clear and moist.    Eyes: PERRL, EOMI, Conjunctivae without discharge   Neck: See head.   Cardiovascular: RRR, No murmurs, rubs or gallops.    Pulmonary/Chest: BS equal bilaterally, good air movement. No respiratory distress. No wheezes, rales or chest tenderness.    Abdominal:  Suprapubic TTP with no rebound or guarding. BS +. Abdomen soft.   Musculoskeletal: No obvious deformity or swelling noted.  No CVA tenderness.   Skin: Warm and dry. No rash, erythema, pallor or cyanosis   Psychiatric: Behavior is normal. Mood and affect congruent.     Neurological: Alert&Ox3. Grossly intact.     Labs:  Results for orders placed or performed during the hospital encounter of 12/12/17 (from the past 24 hour(s))   BASIC METABOLIC PANEL, NON-FASTING   Result Value Ref Range    SODIUM 138 136 - 145 mmol/L    POTASSIUM 3.7 3.5 - 5.1 mmol/L    CHLORIDE 103 96 - 111 mmol/L    CO2 TOTAL 26 22 - 32 mmol/L    ANION GAP 9 4 - 13 mmol/L    CALCIUM 10.1 8.5 - 10.2 mg/dL    GLUCOSE 89 65 - 139 mg/dL    BUN 14 8 - 25 mg/dL    CREATININE 0.94 0.49 - 1.10 mg/dL    BUN/CREA RATIO 15 6 - 22     ESTIMATED GFR >59 >59 mL/min/1.20m^2    Narrative    Hemolysis can alter results at this level (slight).  Lipemia can alter results at this level (slight).   CBC/DIFF    Narrative    The following orders were created for panel order CBC/DIFF.  Procedure                               Abnormality         Status                     ---------                               -----------         ------                     CBC WITH DIFF[[id number]]                Abnormal            Final result                 Please view results for these tests on the individual orders.   HCG, SERUM QUALITATIVE, PREGNANCY   Result Value Ref Range  PREGNANCY, SERUM QUALITATIVE Negative Negative    Narrative    Sensitivity of the qualitative pregnancy test is 20 mIU hCG/mL.  If pregnancy is strongly considered, perform quantitative hCG or repeat in 48  hours.   HEPATIC FUNCTION PANEL   Result Value Ref Range    ALBUMIN 3.8 3.4 - 4.8 g/dL    ALKALINE PHOSPHATASE 60 <150 U/L    ALT (SGPT) 18 <55 U/L    AST (SGOT) 20 8 - 41 U/L    BILIRUBIN TOTAL 0.3 0.3 - 1.3 mg/dL    BILIRUBIN DIRECT 0.1 <0.3 mg/dL    PROTEIN TOTAL 7.6 6.0 - 8.0 g/dL    Narrative    Lipemia can alter results at this level (slight).  Lipemia can alter results at this level (slight).  Hemolysis can alter results at this level (slight).   LIPASE   Result Value Ref Range    LIPASE 62 10 - 80 U/L   CBC WITH DIFF   Result Value Ref Range    WBC 8.2 3.5 - 11.0 x10^3/uL    RBC 4.95 (H) 3.63 - 4.92 x10^6/uL    HGB 14.6 11.2 - 15.2 g/dL    HCT 42.8 33.5 - 45.2 %    MCV 86.5 78.0 - 100.0 fL    MCH 29.5 27.4 - 33.0 pg    MCHC 34.1 32.5 - 35.8 g/dL    RDW 14.5 12.0 - 15.0 %    PLATELETS 404 140 - 450 x10^3/uL    MPV 7.0 (L) 7.5 - 11.5 fL    NEUTROPHIL % 62 %    LYMPHOCYTE % 28 %    MONOCYTE % 7 %    EOSINOPHIL % 2 %    BASOPHIL % 1 %    NEUTROPHIL # 5.04 1.50 - 7.70 x10^3/uL    LYMPHOCYTE # 2.31 1.00 - 4.80 x10^3/uL    MONOCYTE # 0.59 0.30 - 1.00 x10^3/uL    EOSINOPHIL # 0.18 0.00 -  0.50 x10^3/uL    BASOPHIL # 0.07 0.00 - 0.20 x10^3/uL   HEPATITIS C ANTIBODY SCREEN WITH REFLEX TO HCV PCR   Result Value Ref Range    HCV ANTIBODY QUALITATIVE Negative Negative   HIV1/HIV2 SCREEN, COMBINED ANTIGEN AND ANTIBODY   Result Value Ref Range    HIV SCREEN, COMBINED ANTIGEN & ANTIBODY Negative Negative   URINALYSIS, MACROSCOPIC AND MICROSCOPIC W/CULTURE REFLEX    Narrative    The following orders were created for panel order URINALYSIS, MACROSCOPIC AND MICROSCOPIC W/CULTURE REFLEX.  Procedure                               Abnormality         Status                     ---------                               -----------         ------                     URINALYSIS, MACROSCOPIC[[id number]]      Abnormal            Final result               URINALYSIS, MICROSCOPIC[[id number]]      Abnormal  Final result                 Please view results for these tests on the individual orders.   URINALYSIS, MACROSCOPIC   Result Value Ref Range    SPECIFIC GRAVITY 1.014 1.005 - 1.030    GLUCOSE Negative Negative mg/dL    PROTEIN Negative Negative mg/dL    BILIRUBIN Negative Negative mg/dL    UROBILINOGEN Negative Negative mg/dL    PH 5.0 5.0 - 8.0    BLOOD Small (A) Negative mg/dL    KETONES Negative Negative mg/dL    NITRITE Negative Negative    LEUKOCYTES Large (A) Negative WBCs/uL    APPEARANCE Clear Clear    COLOR Normal (Yellow) Normal (Yellow)   URINALYSIS, MICROSCOPIC   Result Value Ref Range    WBCS 83.0 (H) <11.0 /hpf    RBCS 22.0 (H) <6.0 /hpf    BACTERIA Occasional or less Occasional or less /hpf    SQUAMOUS EPITHELIAL CELLS Occasional or less Occasional or less /lpf    MUCOUS Light Light /lpf   DRUG SCREEN, LOW OPIATE CUTOFF, NO CONFIRMATION, URINE   Result Value Ref Range    BUPRENORPHINE URINE Negative Negative, Not Reported    CANNABINOIDS URINE Negative Negative, Not Reported    COCAINE METABOLITES URINE Negative Negative, Not Reported    METHADONE URINE Negative Negative, Not Reported    OPIATES  URINE (LOW CUTOFF) Negative Negative, Not Reported    OXYCODONE URINE Negative Negative, Not Reported    ECSTASY/MDMA URINE Negative Negative, Not Reported    CREATININE RANDOM URINE 67 No Reference Range Established mg/dL    AMPHETAMINES URINE Negative Negative, Not Reported    BARBITURATES URINE Negative Negative, Not Reported    BENZODIAZEPINES URINE Negative Negative, Not Reported    OXIDANT-ADULTERATION Negative Negative, Not Reported    PH-ADULTERATION 5.6 4.5 - 9.0    SPECIFIC GRAVITY-ADULTERATION 1.023 1.005 - 1.030 g/mL       Imaging:      Results for orders placed or performed during the hospital encounter of 12/12/17 (from the past 72 hour(s))   CT CHEST ABDOMEN PELVIS W IV CONTRAST     Status: None (Preliminary result)    Narrative    [place]  Female, 62 years old.    CT CHEST ABDOMEN PELVIS W IV CONTRAST performed on 12/12/2017 5:18 PM.    REASON FOR EXAM:  History of neck mass.  Lower abdominal pain with  recurrent UTIs.  Concern for malignancy  RADIATION DOSE: 1003.10 mGycm  CONTRAST: 100 ml's of Optiray 320    TECHNIQUE: Contrast-enhanced images of the chest abdomen and pelvis were  obtained at 3 to 5 mm slice thickness in the axial plane from the thoracic  inlet to the proximal thighs. Lung and soft tissue algorithms as well as  coronal and sagittal reformatting with maximum intensity projections were  then performed.    COMPARISON: CT PE 02/19/2016.    FINDINGS:   CHEST:  Airways and lungs: The tracheobronchial tree is patent. There is no  evidence of intraluminal mass or obstruction. The lungs are clear  bilaterally without evidence of focal consolidation or groundglass  opacification.    Pleura: No pleural effusion or pneumothorax is present.    Heart and great vessels: The heart is within normal limits for size. There  is no pericardial effusion. The ascending aorta is within normal limits for  size. The aorta demonstrates normal branching pattern. Scattered calcified  and  noncalcified  atherosclerotic plaque is visualized within the aortic  arch and into the great vessels. The pulmonary trunk is within normal  limits for size. No pulmonary embolism is appreciated within the main  pulmonary arteries.    Mediastinum: The thyroid demonstrates a small 5 x 11 mm hypoattenuating  area within the right lobe. This is not well characterized on the current  study. The esophagus is decompressed through the majority of the  mediastinum, limiting evaluation for wall thickening. No mediastinal  adenopathy is appreciated.    ABDOMEN/PELVIS:    Liver: The liver is within normal limits for size and demonstrates normal  contour. No enhancing lesions are visualized.    Gallbladder and biliary tree: The gallbladder is surgically absent. The  common bile duct is within normal limits for caliber. There is no evidence  of intra or extra hepatic biliary ductal dilatation.    Spleen: The spleen is within normal limits for size.    Pancreas: The pancreas is atrophic. No peripancreatic fluid or fat  stranding is appreciated.    Adrenals: The bilateral adrenal glands are without masses.    Kidneys, ureters, and bladder: The bilateral renal parenchyma demonstrates  symmetrical uptake. No perinephric stranding is present. No  hydroureteronephrosis or nephroureterolithiasis is appreciated bilaterally.  The bladder is partially decompressed, limiting evaluation for wall  thickening.    Stomach and bowel: Stomach is decompressed, limiting evaluation for wall  thickening. Scattered intraluminal bowel gas is visualized throughout the  abdomen to the level of the rectum in a nonobstructive pattern. No  abnormally dilated loops of small or large bowel are appreciated. The  appendix is surgically absent. Diverticulosis without diverticulitis is  visualized throughout the descending and rectosigmoid colon.    Reproductive organs: The uterus is surgically absent.    Lymph nodes: There is no renal, pelvic, mesenteric,  retroperitoneal,  mediastinal, or axillary adenopathy appreciated.    Vasculature: The abdominal aorta is nonaneurysmal. Scattered  atherosclerotic calcification is visualized within the aorta. The branch  vessels appear patent. The hepatic and portal veins appear patent.    Peritoneum: There is no intraperitoneal free air free fluid.    Bones and soft tissues: Redemonstration of previously noted left breast  cyst series 3 image 56. Mild degenerative changes are visualized within the  spine. There is no evidence of fracture or traumatic malalignment.      Impression    1.  No acute process is visualized within the chest, abdomen, or pelvis.  2.  Poorly characterized tiny hypoattenuating area within the right  thyroid. Recommend ultrasound evaluation for further characterization on  outpatient basis.  3.  Mild atherosclerotic disease.  4.  Diverticulosis without diverticulitis.     CT SOFT TISSUE NECK W IV CONTRAST     Status: None (Preliminary result)    Narrative    CT SOFT TISSUE NECK W IV CONTRAST performed on 12/12/2017 5:35 PM    INDICATION: 62 years old Female  Right-sided neck edema and discomfort history of mass resection    TECHNIQUE: Axial images from the lung apices to the skull base after  administration of IV contrast with reformatted coronal and sagittal images    CONTRAST: 50 cc of Optiray 320    RADIATION DOSE: 412.90 mGycm    COMPARISON: MRI C-spine dated 03/17/2010.    FINDINGS:  There are postsurgical changes related to right partial parotidectomy.  There is no discrete enhancing mass in the resection bed. The left parotid  gland is unremarkable. No adenopathy  is identified. The fat planes of the  neck are preserved.    The airway is patent. The large vessels of the neck are patent. The thyroid  gland is normal in size. Alignment of the C-spine is normal and there is  mild degenerative change.       Impression    Postsurgical changes related to right partial parotidectomy without  evidence of acute  abnormality.         Orders Placed This Encounter   . URINE CULTURE   . CANCELED: CT ABDOMEN PELVIS W IV CONTRAST   . CT SOFT TISSUE NECK W IV CONTRAST   . CT CHEST ABDOMEN PELVIS W IV CONTRAST   . BASIC METABOLIC PANEL, NON-FASTING   . CBC/DIFF   . HCG, SERUM QUALITATIVE, PREGNANCY   . HEPATIC FUNCTION PANEL   . LIPASE   . CBC WITH DIFF   . HEPATITIS C ANTIBODY SCREEN WITH REFLEX TO HCV PCR   . HIV1/HIV2 SCREEN, COMBINED ANTIGEN AND ANTIBODY   . URINALYSIS, MACROSCOPIC AND MICROSCOPIC W/CULTURE REFLEX   . URINALYSIS, MACROSCOPIC   . URINALYSIS, MICROSCOPIC   . DRUG SCREEN, LOW OPIATE CUTOFF, NO CONFIRMATION, URINE   . SCHEDULE FOLLOW-UP SURG SPEC - UROLOGY - PHYSICIAN OFFICE CENTER   . SCHEDULE FOLLOW-UP [facility]   . ketorolac (TORADOL) 30 mg/mL injection   . ioversol (OPTIRAY 320) infusion   . ioversol (OPTIRAY 320) infusion   . cefTRIAXone (ROCEPHIN) 1 g in D5W 50 mL IVPB       Abnormal Lab results:  Labs Reviewed   CBC WITH DIFF - Abnormal; Notable for the following components:       Result Value    RBC 4.95 (*)     MPV 7.0 (*)     All other components within normal limits   URINALYSIS, MACROSCOPIC - Abnormal; Notable for the following components:    BLOOD Small (*)     LEUKOCYTES Large (*)     All other components within normal limits   URINALYSIS, MICROSCOPIC - Abnormal; Notable for the following components:    WBCS 83.0 (*)     RBCS 22.0 (*)     All other components within normal limits   BASIC METABOLIC PANEL - Normal    Narrative:     Hemolysis can alter results at this level (slight).  Lipemia can alter results at this level (slight).   HCG, SERUM QUALITATIVE, PREGNANCY - Normal    Narrative:     Sensitivity of the qualitative pregnancy test is 20 mIU hCG/mL.  If pregnancy is strongly considered, perform quantitative hCG or repeat in 48  hours.   HEPATIC FUNCTION PANEL - Normal    Narrative:     Lipemia can alter results at this level (slight).  Lipemia can alter results at this level  (slight).  Hemolysis can alter results at this level (slight).   LIPASE - Normal   HEPATITIS C ANTIBODY SCREEN WITH REFLEX TO HCV PCR - Normal   HIV1/HIV2 SCREEN, COMBINED ANTIGEN AND ANTIBODY - Normal   URINE CULTURE   CBC/DIFF    Narrative:     The following orders were created for panel order CBC/DIFF.  Procedure                               Abnormality         Status                     ---------                               -----------         ------  CBC WITH DIFF[[id number]]                Abnormal            Final result                 Please view results for these tests on the individual orders.   URINALYSIS, MACROSCOPIC AND MICROSCOPIC W/CULTURE REFLEX    Narrative:     The following orders were created for panel order URINALYSIS, MACROSCOPIC AND MICROSCOPIC W/CULTURE REFLEX.  Procedure                               Abnormality         Status                     ---------                               -----------         ------                     URINALYSIS, MACROSCOPIC[[id number]]      Abnormal            Final result               URINALYSIS, MICROSCOPIC[[id number]]      Abnormal            Final result                 Please view results for these tests on the individual orders.   DRUG SCREEN, NO CONFIRMATION, URINE       EKG: N/a    Plan: Appropriate labs and imaging ordered. Medical Records reviewed.    Therapy/Procedures/Course/MDM:   Patient's vitals were stable throughout visit.    CT soft tissue neck W IV contrast:  See above.  CT chest abdomen pelvis W IV contrast:  See above.  Imaging showed no acute findings.  Nonacute findings discussed with patient in detail and regarding need of follow-up.  Lab results as shown above.   Medications   ketorolac (TORADOL) 30 mg/mL injection (30 mg Intravenous Given 12/12/17 1730)   ioversol (OPTIRAY 320) infusion (100 mL Intravenous Given 12/12/17 1719)   ioversol (OPTIRAY 320) infusion (50 mL Intravenous Given 12/12/17 1722)   cefTRIAXone  (ROCEPHIN) 1 g in D5W 50 mL IVPB (0 g Intravenous Stopped 12/12/17 1915)    UA - concerning for UTI.  Treated with Rocephin.  Will discharge home on Keflex.  Long conversation with the patient regarding reoccurring UTIs and neck pain near previous surgical site.  Discussed with patient although imaging shows no acute findings today, that follow-up with ENT service as well as urology service is recommended.  ENT service to re-evaluate previous postsurgical site in which appears to have normal postsurgical findings on imaging, however the patient is experiencing new pain to the area.  Patient states that she will follow up with ENT.  Patient also encouraged to follow up with Urology due to recurrent UTIs.  Patient states that she has had multiple UTI since October, and is unable to have been resolved.  Patient will be treated with Keflex, however encouraged follow-up with Urology in which she is agreeable.  Referrals placed both ENT and Neurology.  Offered patient further workup to include female examination such as  pelvic exam and pelvic ultrasound in which she declines at this time.  Patient states that with her UTI finding she is confident that her bleeding is coming from her urethra and that her symptoms are only that when she urinates.  Risk benefits were discussed with the patient regarding declining of workup.  Patient agreed.  Patient states that she will follow up with her OBGYN upon her return to her hometown in [place].  Patient states that if she is able to follow up with Urology and ENT services while she is in [place] she will, however otherwise she will follow up in [place].  Results discussed with patient.  she had improvement with initial ED management. she was given the opportunity to ask questions.  She was agreeable to discharge.  Patient's symptoms improved with medication management in the ED.  Over-the-counter medications as needed.  Antibiotics as prescribed.  Discharge instructions and return  precautions.  Discharged home.    Consults:    None    Impression:   Encounter Diagnoses   Name Primary?   . UTI (urinary tract infection) Yes   . Neck pain    . Abdominal pain, unspecified abdominal location        Disposition:  Discharged       Following the above history, physical exam, and studies, the patient was deemed stable and suitable for discharge.   she will follow up with PCP as needed.  OBGYN in hometown.  Referrals placed to ENT and urology.     Keflex was prescribed.  Medication instructions were discussed with the patient   It was advised that the patient return to the ED with any new, concerning or worsening symptoms and follow up as directed.    The patient verbalized understanding of all instructions and had no further questions or concerns.     I am scribing for, and in the presence of, Franco Nones, APRN, NP-C, for services provided on 12/12/2017.  Christian. D. Aris Everts     //Christian. Bethann Berkshire, SCRIBE-12/12/2017    I personally performed the services described in this documentation, as scribed  in my presence, and it is both accurate  and complete.    Fabio Asa, APRN,NP-C    The supervising physician was physically present and available for consultation, and did not physically see the patient.    Fabio Asa, APRN,NP-C  12/12/2017, 20:17

## 2017-12-12 NOTE — ED Nurses Note (Signed)
Pt. Here with c/o lower abd, pain that started again yesterday.  States she was seen here in October for the same problem and given antibiotics.  She said the same pain came back yesterday.  Pt. States she has to wear a pad because she is incontinent of urine.  States she has burning when she voids.  Pt. Also has pain on palpation of her abd.  She also states that when she wipes she noticed some blood on the toilet paper.  Also here with c/o pain and swelling behind her right ear that she has been dealing with for about a month.  She said it hurts if someone touches behind her right ear.  Pt. Is alert and oriented.  Provider was in to evaluate.

## 2017-12-12 NOTE — ED Nurses Note (Signed)
Placed 20g in LAC, labs and urine sent.

## 2017-12-12 NOTE — ED Nurses Note (Signed)
Pt. Returned from CT-placed in RP.  Gave 30mg  toradol for 10/10 lower abd. Pain.

## 2017-12-12 NOTE — ED Nurses Note (Signed)
Pt taken to CT.

## 2017-12-13 ENCOUNTER — Other Ambulatory Visit (HOSPITAL_COMMUNITY): Payer: Self-pay | Admitting: Medical

## 2017-12-13 MED ORDER — SULFAMETHOXAZOLE 800 MG-TRIMETHOPRIM 160 MG TABLET: 1 | Tab | Freq: Two times a day (BID) | ORAL | 0 refills | 0 days | Status: AC

## 2017-12-14 LAB — URINE CULTURE: URINE CULTURE: 80000 — AB

## 2017-12-29 MED ORDER — HYDROCHLOROTHIAZIDE 12.5 MG PO CAPS
12.5 MG | ORAL_CAPSULE | ORAL | 5 refills | Status: AC
Start: 2017-12-29 — End: ?

## 2018-01-31 ENCOUNTER — Encounter (HOSPITAL_BASED_OUTPATIENT_CLINIC_OR_DEPARTMENT_OTHER): Payer: Self-pay | Admitting: Family Medicine

## 2018-01-31 ENCOUNTER — Ambulatory Visit: Payer: MEDICAID | Attending: Family Medicine | Admitting: Family Medicine

## 2018-01-31 VITALS — BP 102/72 | HR 80 | Temp 98.2°F | Resp 16 | Ht 66.28 in | Wt 184.5 lb

## 2018-01-31 DIAGNOSIS — I10 Essential (primary) hypertension: Secondary | ICD-10-CM | POA: Insufficient documentation

## 2018-01-31 DIAGNOSIS — Z79899 Other long term (current) drug therapy: Secondary | ICD-10-CM | POA: Insufficient documentation

## 2018-01-31 DIAGNOSIS — F4310 Post-traumatic stress disorder, unspecified: Secondary | ICD-10-CM | POA: Insufficient documentation

## 2018-01-31 DIAGNOSIS — R309 Painful micturition, unspecified: Secondary | ICD-10-CM

## 2018-01-31 DIAGNOSIS — R32 Unspecified urinary incontinence: Secondary | ICD-10-CM

## 2018-01-31 DIAGNOSIS — R300 Dysuria: Secondary | ICD-10-CM | POA: Insufficient documentation

## 2018-01-31 DIAGNOSIS — M542 Cervicalgia: Secondary | ICD-10-CM | POA: Insufficient documentation

## 2018-01-31 DIAGNOSIS — Z1239 Encounter for other screening for malignant neoplasm of breast: Secondary | ICD-10-CM | POA: Insufficient documentation

## 2018-01-31 DIAGNOSIS — Z1231 Encounter for screening mammogram for malignant neoplasm of breast: Secondary | ICD-10-CM

## 2018-01-31 DIAGNOSIS — F331 Major depressive disorder, recurrent, moderate: Secondary | ICD-10-CM | POA: Insufficient documentation

## 2018-01-31 MED ORDER — GABAPENTIN 300 MG CAPSULE
600.0000 mg | ORAL_CAPSULE | Freq: Every day | ORAL | 1 refills | Status: DC
Start: 2018-01-31 — End: 2018-03-09

## 2018-01-31 MED ORDER — GABAPENTIN 300 MG CAPSULE
600.0000 mg | ORAL_CAPSULE | Freq: Every day | ORAL | 1 refills | Status: DC
Start: 2018-01-31 — End: 2018-01-31

## 2018-01-31 MED ORDER — PHENAZOPYRIDINE 100 MG TABLET
100.00 mg | ORAL_TABLET | Freq: Three times a day (TID) | ORAL | 0 refills | Status: DC
Start: 2018-01-31 — End: 2018-03-09

## 2018-01-31 MED ORDER — FLUOXETINE 20 MG TABLET
20.00 mg | ORAL_TABLET | Freq: Two times a day (BID) | ORAL | 1 refills | Status: DC
Start: 2018-01-31 — End: 2018-03-26

## 2018-01-31 NOTE — Progress Notes (Signed)
Department of Family Medicine  Outpatient Clinic Progress Note    Date: 01/31/2018  Name: [name]  MRN: [id number]  DOB: 1956/05/10    CC:   Chief Complaint   Patient presents with   . Medication Refill   . Referrals       S: Ms. [name] is a 62 y.o. female, here today for medication refills. She normally sees [facility] in [facility], [place]. She is establishing care with new PCP and her appt is 02/23/18. She does not plan on returning to previous physician office because she officially moved back to [facility].    For her MDD and PTSD, she takes Prozac 20 mg BID, has 1 day left of medication and needs a refill. She does believe it helps and allows her to get out of her house. She is interested in reestablished with therapy.     PHQ 9 Follow Up Questionnaire  Little interest or pleasure in doing things.: Not at all  Feeling down, depressed, or hopeless: Not at all  Trouble falling or staying asleep, or sleeping too much.: Nearly every day  Feeling tired or having little energy: Nearly every day  Poor appetite or overeating: Several days  Feeling bad about yourself/ that you are a failure in the past 2 weeks?: Not at all  Trouble concentrating on things in the past 2 weeks?: Several days  Moving/Speaking slowly or being fidgety or restless  in the past 2 weeks?: Not at all  Thoughts that you would be better off DEAD, or of hurting yourself in some way.: Not at all  PHQ 9 Total: 8  Interpretation of Total Score: 5-9 Mild depression  If you checked off any problems, how difficult have these problems made it for you to do your work, take care of things at home, or get along with other people?: Somewhat difficult    She also complains of recurrent dysuria, during her urination and when it gets really bad she has incontinence. She has been treated for multiple UTIs in the past 6 months. She has not seen anyone for it but has taken multiple episodes of antibiotics. She denies any blood in the urine,  has not tried anything to help the pain, denies fevers or chills. States that she has chronic suprapubic pain.    For her HTN, she takes HCTZ 12.5 mg, she lost a lot of weight. She is without CP, SOB, headaches or dizziness. She does not check her BP at home.     Past Medical, Surgical, Social, Family histories reviewed and updated where appropriate in the chart.     ROS:  Constitutional: No fever, chills, body aches, or weakness   Skin: No rash or bruising   HENT: No headaches, sore throat, difficulty swallowing, ear pain, or congestion  Eyes: No vision changes   Cardio: No chest pain, palpitations, or leg swelling   Respiratory: No cough, wheezing, or SOB  GI:  No nausea, vomiting, diarrhea, constipation, reflux, ++abdominal pain  GU:  ++dysuria, no hematuria, polyuria, or lesions  MSK: No joint pain, joint swelling, or back pain  Endo: No polydipsia, hot/cold intolerance, changes in hair or nails, mouth ulcers  Neuro: No loss of sensation, focal deficits, confusion, memory loss, dizziness, numbness, tingling  Psychiatric: No mood changes, irritability, SI or HI  All others negative except for those mentioned in the HPI.    O:   BP 102/72   Pulse 80   Temp 36.8  C (98.2 F) (Thermal Scan)   Resp 16   Ht 1.684 m (5' 6.28")   Wt 83.7 kg (184 lb 8.4 oz)   SpO2 100%   BMI 29.53 kg/m   Physical Exam   Constitutional: No distress.   Cardiovascular: Normal rate, regular rhythm and normal heart sounds.   Pulmonary/Chest: Breath sounds normal. She has no wheezes.   Abdominal: Soft. Bowel sounds are normal. There is tenderness in the suprapubic area. There is no guarding.   Musculoskeletal: She exhibits no edema.   Skin: Skin is warm and dry. Capillary refill takes less than 2 seconds. No rash noted.       Labs:   I have reviewed all relevant labs and discussed any abnormalities with the patient    Weight/BMI:  Wt Readings from Last 3 Encounters:   01/31/18 83.7 kg (184 lb 8.4 oz)   12/12/17 84.8 kg (186 lb 15.2 oz)    08/02/17 87.4 kg (192 lb 10.9 oz)      Body mass index is 29.53 kg/m.    Health maintenance:   Health Maintenance   Topic Date Due   . Shingles Vaccine (1 of 2) 05/29/2007   . Mammography  11/02/2017   . CT Lung Cancer Screening  12/12/2018   . Colonoscopy  01/07/2019   . Adult Tdap-Td (2 - Td) 06/04/2020   . Hepatitis C screening antibody  Completed   . HIV Screening  Completed   . Influenza Vaccine  Completed     A&P: Ms. [name] is a 61 y.o. 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 (CMS HCC)  (primary encounter diagnosis)  Plan:   - controlled  - PHQ9 = 8  - refill for Prozac 20 mg BID  - referral placed for CBT  - record request to be filled out by patient     (R30.9) Painful urination  Plan: POCT Urine dipstick, Refer to [facility] Urology  Urine Dip Results:   Time collected: 0927  Glucose: Negative  Bilirubin: Negative  Ketones: Negative  Urine Specific Gravity: 1.010  Blood (urine): Trace  pH: 7.0  Protein: Negative  Urobilinogen: Normal   Nitrite: Negative  Leukocytes: Negative  - low suspicion for UTI  - pt did not give enough sample for culture  - chronic condition for patient, requested Urology referral  - will give pyridium to aid in symptoms and instructed to use for no longer than 3 days     (Z12.31) Screening for breast cancer  Plan: MAMMO BILATERAL SCREENING-ADDL VIEWS/BREAST US AS REQ BY RAD          (I10) Hypertension  Plan:   - currently controlled   - states that she takes HCTZ 12.5 mg   - will continue without refill as BP is low/normal for her age    (R63) Urinary incontinence in female  Plan: Refer to [facility]  - referral placed    (M54.2) Neck pain  Plan: gabapentin (NEURONTIN) 300 mg Oral Capsule  - meds refilled for 600 mg qD  - pt to follow up with new PCP and establish for refills       Orders Placed This Encounter   . MAMMO BILATERAL SCREENING-ADDL VIEWS/BREAST US AS REQ BY RAD   . Refer to [facility]  Medicine   . Refer to [facility] Urology   . POCT Urine dipstick   . FLUoxetine (SARAFEM) 20 mg Oral Tablet   .  gabapentin (NEURONTIN) 300 mg Oral Capsule   . phenazopyridine (PYRIDIUM) 100 mg Oral Tablet       Return to clinic in 4-6 weeks with new PCP or sooner as needed if symptoms worsen or fail to improve      Sandria Senter, MD 01/31/2018, 09:33  PGY-3  Department of Family Medicine    Prior to the patient's discharge today, I saw and examined the patient. I agree with the resident's/fellow's/nurse practitioner's//trainer's assessment and plan. Any exceptions are noted. I have reviewed and discussed the prevention plan with the patient.    Mackie Pai, MD  Associate Professor  Director, Division of Sports Medicine  Dept of [place] of Medicine  Operated by [facility]

## 2018-01-31 NOTE — Nursing Note (Signed)
01/31/18 0900   Urine test  (Siemens Multistix 10 SG)   Time collected 0927   Color Amber   Clarity Clear   Glucose Negative   Bilirubin Negative   Ketones Negative   Urine Specific Gravity 1.010   Blood (urine) Trace   pH 7.0   Protein Negative   Urobilinogen Normal    Nitrite Negative   Leukocytes Negative   Bottle Number   (Siemens Multistix 10 SG) A160   Lot # [id number]   Expiration Date 02/21/18   Initials sw

## 2018-02-01 ENCOUNTER — Telehealth (HOSPITAL_BASED_OUTPATIENT_CLINIC_OR_DEPARTMENT_OTHER): Payer: Self-pay | Admitting: Family Medicine

## 2018-02-01 NOTE — Telephone Encounter (Signed)
PA needed for Fluoxetine  ID: [id number]   BIN: [id number]  PCN:    Contacted [facility]  Advised of pt's allergy to blue dye in capsules  PA approved through 1 yea  Pt notified via [place], [place]

## 2018-02-09 NOTE — Telephone Encounter (Signed)
Received notification for [facility] that Dr. Darin EngelsAbraham is not authorized under Medicaid to prescribe.    Pt notified that a new Dr would be needed in [place], via vm.

## 2018-02-16 ENCOUNTER — Encounter: Attending: Family Medicine

## 2018-02-22 ENCOUNTER — Encounter (HOSPITAL_BASED_OUTPATIENT_CLINIC_OR_DEPARTMENT_OTHER): Payer: Self-pay | Admitting: Family Medicine

## 2018-02-23 ENCOUNTER — Encounter (HOSPITAL_BASED_OUTPATIENT_CLINIC_OR_DEPARTMENT_OTHER): Payer: Self-pay

## 2018-02-23 ENCOUNTER — Ambulatory Visit (HOSPITAL_BASED_OUTPATIENT_CLINIC_OR_DEPARTMENT_OTHER): Payer: MEDICAID

## 2018-02-23 ENCOUNTER — Ambulatory Visit
Admission: RE | Admit: 2018-02-23 | Discharge: 2018-02-23 | Disposition: A | Discharge status: Home / Self Care | Payer: MEDICAID | Source: Ambulatory Visit | Attending: Family Medicine | Admitting: Family Medicine

## 2018-02-23 ENCOUNTER — Encounter (HOSPITAL_BASED_OUTPATIENT_CLINIC_OR_DEPARTMENT_OTHER): Payer: Self-pay | Admitting: Family Medicine

## 2018-02-23 ENCOUNTER — Ambulatory Visit (HOSPITAL_BASED_OUTPATIENT_CLINIC_OR_DEPARTMENT_OTHER): Payer: MEDICAID | Admitting: Family Medicine

## 2018-02-23 VITALS — BP 120/72 | HR 72 | Temp 96.3°F | Ht 66.0 in | Wt 184.5 lb

## 2018-02-23 DIAGNOSIS — Z1231 Encounter for screening mammogram for malignant neoplasm of breast: Secondary | ICD-10-CM | POA: Insufficient documentation

## 2018-02-23 DIAGNOSIS — K219 Gastro-esophageal reflux disease without esophagitis: Secondary | ICD-10-CM | POA: Insufficient documentation

## 2018-02-23 DIAGNOSIS — E049 Nontoxic goiter, unspecified: Secondary | ICD-10-CM

## 2018-02-23 DIAGNOSIS — E785 Hyperlipidemia, unspecified: Secondary | ICD-10-CM

## 2018-02-23 DIAGNOSIS — R197 Diarrhea, unspecified: Secondary | ICD-10-CM

## 2018-02-23 DIAGNOSIS — F331 Major depressive disorder, recurrent, moderate: Secondary | ICD-10-CM

## 2018-02-23 DIAGNOSIS — F4310 Post-traumatic stress disorder, unspecified: Secondary | ICD-10-CM

## 2018-02-23 DIAGNOSIS — F329 Major depressive disorder, single episode, unspecified: Secondary | ICD-10-CM | POA: Insufficient documentation

## 2018-02-23 DIAGNOSIS — Z1239 Encounter for other screening for malignant neoplasm of breast: Secondary | ICD-10-CM

## 2018-02-23 LAB — BASIC METABOLIC PANEL
ANION GAP: 8 mmol/L (ref 4–13)
BUN/CREA RATIO: 15 (ref 6–22)
BUN: 16 mg/dL (ref 8–25)
CALCIUM: 10.4 mg/dL — ABNORMAL HIGH (ref 8.5–10.2)
CHLORIDE: 101 mmol/L (ref 96–111)
CO2 TOTAL: 28 mmol/L (ref 22–32)
CREATININE: 1.1 mg/dL (ref 0.49–1.10)
ESTIMATED GFR: 51 mL/min/1.73mˆ2 — ABNORMAL LOW (ref >59)
GLUCOSE: 90 mg/dL (ref 65–139)
POTASSIUM: 4.2 mmol/L (ref 3.5–5.1)
SODIUM: 137 mmol/L (ref 136–145)

## 2018-02-23 LAB — LIPID PANEL
CHOL/HDL RATIO: 6
CHOLESTEROL: 257 mg/dL — ABNORMAL HIGH (ref <200)
HDL CHOL: 43 mg/dL — ABNORMAL LOW (ref >=49)
LDL CALC: 172 mg/dL — ABNORMAL HIGH (ref <=100)
NON-HDL: 214 mg/dL — ABNORMAL HIGH (ref <=190)
TRIGLYCERIDES: 209 mg/dL — ABNORMAL HIGH (ref <=150)
VLDL CALC: 42 mg/dL — ABNORMAL HIGH (ref <30)

## 2018-02-23 LAB — THYROID STIMULATING HORMONE (SENSITIVE TSH): TSH: 0.977 u[IU]/mL (ref 0.350–5.000)

## 2018-02-23 LAB — AST (SGOT): AST (SGOT): 18 U/L (ref 8–41)

## 2018-02-23 LAB — ALT (SGPT): ALT (SGPT): 18 U/L (ref <55)

## 2018-02-23 MED ORDER — PANTOPRAZOLE 40 MG TABLET,DELAYED RELEASE
40.00 mg | DELAYED_RELEASE_TABLET | Freq: Every morning | ORAL | 5 refills | Status: DC
Start: 2018-02-23 — End: 2018-08-21

## 2018-02-23 NOTE — Progress Notes (Signed)
[name]  Department of [place] of Medicine  Operated by [facility]    Progress Note  Subjective:  [name] is a 62 y.o. female patient here for depression. Her PCP is Dr Malena Peer but she has never seen her.   Just moved from [place] March 2019. Saw psychiatry there. Had gone to [facility] in 2017.   FHx: sister with psychosis, multiple personality d/o. Died at 33. Brother with schizophrenia.   Patient was diagnosed with MDD with PTSD. Takes Prozac 20mg  BID.   PMHx + for GERD, headaches, lipids.   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 Takes Neurontin 600mg  BID  ROS: recurrent bladder infections (sees urology 03/10/18); bowel changes with diarrhea q am x 2 weeks.    Other 10-point ROS are negative.   PFMSHx: I have reviewed and updated as appropriate the past medical, family and social history.       Objective:  Generally speaking, WNWD in NAD. BP 120/72   Pulse 72   Temp 35.7 C (96.3 F)   Ht 1.676 m (5\' 6" )   Wt 83.7 kg (184 lb 8.4 oz)   SpO2 100%   BMI 29.78 kg/m     HEENT NC/AT, PERRLA, EOMI, conjunctiva pink and moist; TM's clear bilaterally. Nasal mucosa normal. O/P no erythema, exudates. Neck supple FROM, no adenopathy, thyromegaly, bruits.  Lungs CTA no r/r/w  Cor RRR no m/g/r.   Extremities reveal no cyanosis, clubbing or edema. Pulses are intact. Sensation is normal. Good strength and tone. Full range of motion of joints.      Assessment:  1. Goiter  2. MDD  3. GERD      Plan:  1. Labs ordered  2. Her situation is complicated and she has seen [facility] in past. I will consult them for treatment options.  3. I will ask that she follow up with her PCP to go over labs in 3 weeks.    Mackie Pai, MD  Associate Professor  Director, Division of Sports Medicine  Dept of [place] of Medicine

## 2018-03-03 ENCOUNTER — Ambulatory Visit: Payer: MEDICAID | Attending: LICENSED CLINICAL SOCIAL WORKER | Admitting: LICENSED CLINICAL SOCIAL WORKER

## 2018-03-03 DIAGNOSIS — F4000 Agoraphobia, unspecified: Secondary | ICD-10-CM | POA: Insufficient documentation

## 2018-03-03 DIAGNOSIS — F339 Major depressive disorder, recurrent, unspecified: Secondary | ICD-10-CM | POA: Insufficient documentation

## 2018-03-03 DIAGNOSIS — F331 Major depressive disorder, recurrent, moderate: Secondary | ICD-10-CM
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
## 2018-03-08 NOTE — Progress Notes (Signed)
Department of Family Medicine    [name]  DOB: 1956/07/31, 62 y.o.   MRN: [id number]  Date of encounter: 03/09/2018    Chief Complaint   Patient presents with   . Follow Up   . Depression     Subjective:  New Patient Visit.   [name] is a 62 y.o. female with PMH of Multinodular Goiter, HLD, GERD, MDD, PTSD, HTN, chronic neck pain who presents today to establish care.    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    MDD  PTSD  - stable on Prozac 20mg  BID  - following with behavioral medicine, Barbara Blumish -LCSW  - PHQ9- 15  - denies suicidal and homicidal ideation    HTN  - stopped HCTZ 12.5mg  daily 4 weeks ago due to low blood pressure  - lost a lot of weight intentionally with exercise and healthy eating    Chronic Neck Pain  Right Facial Nerve Paralysis  - s/p right partial parotidectomy resulting in nerve injury  - using Gabapentin 600mg  daily    Hx Multinodular Goiter  - patient states she was getting thyroid US every 6 months for goiter, ENT was following her  - last [place] 08/03/12    Recurrent Bladder Infections  - urology appt tmrw    Past Medical History:   Diagnosis Date   . Arthritis    . Cancer (CMS HCC)     internal skin cancer   . Depression    . GERD (gastroesophageal reflux disease)     controlled w/med   . Heart murmur 1979    benign   . Hyperlipidemia LDL goal < 100 08/29/2013   . MINOR CAD (coronary artery disease) 11/10/2012    no treatment other than cholesterol medications; follows regularly with Dr. Rodney Booze   . Multinodular goiter    . Rheumatic fever 16    no  complications   . Shortness of breath     on exertion   . Squamous cell carcinoma 02/06/2015    right side of neck   . Vaginal prolapse 2012    surgery improved significantly   . Wears glasses      Past Surgical History:   Procedure Laterality Date   . COLONOSCOPY  01/06/09    COLONOSCOPY performed by Loletta Parish F at [facility]   . GASTROSCOPY  03/05/2010    GASTROSCOPY performed by Jocelyn Lamer at [facility]   . GASTROSCOPY WITH BIOPSY  03/05/2010    GASTROSCOPY WITH BIOPSY performed by Jocelyn Lamer at [facility]   . HX ADENOIDECTOMY     . HX ANKLE FRACTURE [place]  2007    left distal fibula, casted   . HX APPENDECTOMY     . HX CHOLECYSTECTOMY     . HX COLONOSCOPY     . HX CYSTOCELE REPAIR  09/24/09   . HX HAND SURGERY  2012    for Carpal tunnel    . HX OOPHORECTOMY      left ovary  removed   . HX TONSILLECTOMY     . HX TOTAL VAGINAL HYSTERECTOMY  1979   . HX WRIST FRACTURE [place]  2007    [place] injury   . PB REVISE ULNAR NERVE AT ELBOW  1979   . PB UPPER GI ENDOSCOPY,BIOPSY  12/19/07    patulous GE junction zone, erythema, nonerosive GERD     Allergies   Allergen Reactions   . Cymbalta [Duloxetine]      Rash on feet and hands   . Blue Dye      Told to avoid due to patch test result by Simone Curia   . Lipitor [Atorvastatin]      Muscle pain   . Nickel    . Codeine Itching     RASH   . Mobic [Meloxicam] Nausea/ Vomiting   . Naprosyn [Naproxen] Nausea/ Vomiting     Current Outpatient Medications   Medication Sig   . aspirin 81 mg Oral Tablet, Chewable Take 1 Tab (81 mg total) by mouth Once a day   . Azelaic Acid 20 % Cream Apply topically Twice daily   . Clobetasol (TEMOVATE) 0.05 % Ointment Apply topically Twice daily To rash on hands and feet   . FLUoxetine (SARAFEM) 20 mg Oral Tablet Take 1 Tab (20 mg total) by mouth Twice daily   . gabapentin (NEURONTIN) 300 mg Oral Capsule Take 2 Caps (600 mg total) by mouth Once a day   . Halobetasol Propionate (ULTRAVATE) 0.05 % Cream Apply  topically. Three times a week   . Metronidazole  (METROCREAM) 0.75 % Cream Apply topically Twice daily   . mineral oil-hydrophil petrolat (AQUAPHOR) Ointment by Apply Topically route Four times a day as needed   . nitroGLYCERIN (NITROSTAT) 0.4 mg Sublingual Tablet, Sublingual 1 Tab (0.4 mg total) by Sublingual route Every 5 minutes as needed for Chest pain for up to 3 doses for 3 doses over 15 minutes   . nystatin (NYSTOP) 100,000 unit/gram Powder Apply once or twice daily to PREVENT rash under breasts   . pantoprazole (PROTONIX) 40 mg Oral Tablet, Delayed Release (E.C.) Take 1 Tab (40 mg total) by mouth Every morning before breakfast   . phenazopyridine (PYRIDIUM) 100 mg Oral Tablet Take 1 Tab (100 mg total) by mouth Three times a day (Patient not taking: Reported on 02/23/2018)   . triamcinolone acetonide (ARISTOCORT A) 0.1 % Ointment by Apply Topically route Twice daily     Family Medical History:     Problem Relation (Age of Onset)    Bipolar Disorder Daughter    Breast Cancer Paternal Aunt, Paternal 64, Paternal Aunt, Paternal Aunt, Paternal Aunt, Other    Cancer Paternal Aunt, Paternal 97, Paternal 5, Paternal 65, Paternal 44, Other (21)    Congestive Heart Failure Father (6)    Coronary Artery Disease Father, Mother (74)    Diabetes Mother    Heart Attack Father, Sister    High Cholesterol Mother    Hypertension (High Blood Pressure) Mother, Brother, Sister, Sister    Kidney Disease Sister    Leukemia Paternal Uncle, Paternal Uncle    Stroke Paternal Grandmother    Thyroid Disease Sister        Social History     Tobacco Use   . Smoking status: Former Smoker     Packs/day: 2.00     Years: 40.00     Pack years: 80.00     Types: Cigarettes     Last attempt to quit: 01/22/2005  Years since quitting: 13.1   . Smokeless tobacco: Never Used   Substance Use Topics   . Alcohol use: No     Alcohol/week: 0.0 oz   . Drug use: No     Employed: [employer]  Review of Systems: Positive ROS discussed in HPI, otherwise all other systems negative.    Objective:  Blood  pressure 124/76, pulse 85, temperature 36.4 C (97.5 F), temperature source Thermal Scan, height 1.676 m (5' 5.98"), weight 86 kg (189 lb 9.5 oz), SpO2 97 %, not currently breastfeeding.Body mass index is 30.62 kg/m.  General Appearance: Appears stated age, alert and in no acute distress.   Eye: Pupils equal and round. Conjunctiva normal.  ENT: Neck supple. Trachea midline. Naso and oropharynx are normal and without edema/exudates. Bilateral tympanic membranes visualized, clear, no erythema and non-bulging with + light reflex bilaterally. No lymphadenopathy. No pain on palpation of mastoid.   Thyroid: palpable nodules present in both lobes, more prominent on the right  Respiratory:  non-labored breathing, clear to auscultation bilaterally  Cardiovascular: Normal rate, regular rhythm, no murmurs/gallops/rubs  GI: Bowel sounds are normal. Soft, non-tender, non-distended, no guarding.  Musculoskeletal: Exhibits no cyanosis or edema in lower extremities.   Skin: Skin is warm and dry. No rash noted.   Neuro: grossly normal, CN 2-12 intact, decreased right sided facial sensation   Psychiatric: appropriate affect and behavior    08/03/12 US Thyroid  Ultrasound of the thyroid is performed with comparison to an earlier examination of July 22, 2010.  Initial ultrasound was then October 2008.  History is multinodular goiter.  Ultrasound imaging shows the right lobe measures 5.6 x 1.7 x 1.7 cm.  The left lobe is 5 x 1.7 x 1.5 cm.  The isthmus is 3.9 mm in thickness.  The size of the thyroid is unchanged.  Multiple small nodules are again identified similar to the previous examination.  The largest is in the lower pole on the right measuring 11 x 5 x 10 mm which is unchanged.  Other smaller nodules are present within the right lobe measuring 6 mm and less in diameter.  The left lobe tiny nodules are seen measuring 5 mm and less in diameter.    IMPRESSION: No change in the size or appearance of the thyroid nor of the  small bilateral nodules.  These findings are consistent with multinodular goiter.    01/06/09 Colonoscopy  Impression: few diverticula in sigmoid colon  Repeat Colonoscopy in 5 years      The 10-year ASCVD risk score Mikey Bussing DC Jr., et al., 2013) is: 4.4%    Values used to calculate the score:      Age: 34 years      Sex: Female      Is Non-Hispanic African American: No      Diabetic: No      Tobacco smoker: No      Systolic Blood Pressure: 335 mmHg      Is BP treated: No      HDL Cholesterol: 43 mg/dL      Total Cholesterol: 257 mg/dL    Body mass index is 30.62 kg/m.    Assessment/Plan:  [name] 62 y.o. female     Mood Disorder  - stable on Prozac    HTN  - stable with lifestyle modifications    Chronic Neck Pain s/p Iatrogenic Nerve Injury (right partial parotidectomy)  - Narx Check reviewed and appropriate  Rx Gabapentin 300mg , take 2 tabs (600mg ) daily, #  60, 1 refill; not to be filled before March 25, 2018    Recurrent Bladder Infections  - establishing with urology 03/10/18    Hx Multinodular Goiter  - TSH 0.977  - CT neck 12/12/17 shows thyroid gland normal in size  - 08/03/12 US Thyroid    HLD  - no statin started at this time  - most current AHA guidelines regard statins show benefit in this patient without diabetes if ASCVD 41yr risk >7.5% or LDL >190; she does not meet these guidelines  - continue to monitor annually    Labs: T4, T3, Calcium, Vitamin D      Preventative Health  Colonoscopy- 12/27/08 with diverticula in sigmoid colon, repeat was supposed to be in 5 years, pt due for repeat now, will order  Mammogram- completed 02/23/18- BIRADS 2- benign, f/u in 1 year  Pap- partial hysterectomy 1979 with no pap smear afterwards; hysterectomy for multiple fibroids and menorrhagia      RTC 4weeks to monitor resolution of right facial pain, if still worsened than typical pain will consider further work up including but not limited to imaging at that time    Dennison Mascot, MD  PGY-1  Family Medicine    I  saw and examined the patient.  I reviewed the resident's note.  I agree with the findings and plan of care as documented in the resident's note.  Any exceptions/additions are edited/noted.    Salvatore Marvel, MD

## 2018-03-09 ENCOUNTER — Ambulatory Visit (HOSPITAL_BASED_OUTPATIENT_CLINIC_OR_DEPARTMENT_OTHER): Payer: MEDICAID

## 2018-03-09 ENCOUNTER — Ambulatory Visit: Payer: MEDICAID | Attending: Geriatric Medicine | Admitting: Family Medicine

## 2018-03-09 ENCOUNTER — Encounter (HOSPITAL_BASED_OUTPATIENT_CLINIC_OR_DEPARTMENT_OTHER): Payer: Self-pay | Admitting: Family Medicine

## 2018-03-09 VITALS — BP 124/76 | HR 85 | Temp 97.5°F | Ht 65.98 in | Wt 189.6 lb

## 2018-03-09 DIAGNOSIS — R519 Headache, unspecified: Secondary | ICD-10-CM

## 2018-03-09 DIAGNOSIS — K219 Gastro-esophageal reflux disease without esophagitis: Secondary | ICD-10-CM | POA: Insufficient documentation

## 2018-03-09 DIAGNOSIS — I10 Essential (primary) hypertension: Secondary | ICD-10-CM

## 2018-03-09 DIAGNOSIS — F4310 Post-traumatic stress disorder, unspecified: Secondary | ICD-10-CM | POA: Insufficient documentation

## 2018-03-09 DIAGNOSIS — Z8619 Personal history of other infectious and parasitic diseases: Secondary | ICD-10-CM | POA: Insufficient documentation

## 2018-03-09 DIAGNOSIS — M542 Cervicalgia: Secondary | ICD-10-CM | POA: Insufficient documentation

## 2018-03-09 DIAGNOSIS — Z1211 Encounter for screening for malignant neoplasm of colon: Secondary | ICD-10-CM

## 2018-03-09 DIAGNOSIS — F329 Major depressive disorder, single episode, unspecified: Secondary | ICD-10-CM | POA: Insufficient documentation

## 2018-03-09 DIAGNOSIS — R51 Headache: Secondary | ICD-10-CM

## 2018-03-09 DIAGNOSIS — Z79899 Other long term (current) drug therapy: Secondary | ICD-10-CM | POA: Insufficient documentation

## 2018-03-09 DIAGNOSIS — E042 Nontoxic multinodular goiter: Secondary | ICD-10-CM | POA: Insufficient documentation

## 2018-03-09 DIAGNOSIS — E049 Nontoxic goiter, unspecified: Secondary | ICD-10-CM

## 2018-03-09 DIAGNOSIS — E785 Hyperlipidemia, unspecified: Secondary | ICD-10-CM | POA: Insufficient documentation

## 2018-03-09 LAB — T3 (TRIIODOTHYRONINE), FREE, SERUM: T3 FREE: 2.7 pg/mL (ref 1.7–3.7)

## 2018-03-09 LAB — CALCIUM: CALCIUM: 9.6 mg/dL (ref 8.5–10.2)

## 2018-03-09 LAB — THYROXINE, FREE (FREE T4)
THYROXINE (T4), FREE: 1.08 ng/dL (ref 0.70–1.25)
THYROXINE (T4), FREE: 1.08 ng/dL (ref 0.70–1.25)

## 2018-03-09 MED ORDER — GABAPENTIN 300 MG CAPSULE
600.0000 mg | ORAL_CAPSULE | Freq: Every day | ORAL | 1 refills | Status: DC
Start: 2018-03-09 — End: 2018-03-09

## 2018-03-09 MED ORDER — GABAPENTIN 300 MG CAPSULE
600.0000 mg | ORAL_CAPSULE | Freq: Every day | ORAL | 1 refills | Status: DC
Start: 2018-03-09 — End: 2018-06-28

## 2018-03-09 NOTE — Nursing Note (Signed)
03/09/18 0900   PHQ 9 (follow up)   Little interest or pleasure in doing things. 1   Feeling down, depressed, or hopeless 1   PHQ 2 Total Follow up 2   Trouble falling or staying asleep, or sleeping too much. 3   Feeling tired or having little energy 3   Poor appetite or overeating 2   Feeling bad about yourself/ that you are a failure in the past 2 weeks? 0   Trouble concentrating on things in the past 2 weeks? 2   Moving/Speaking slowly or being fidgety or restless  in the past 2 weeks? 3   Thoughts that you would be better off DEAD, or of hurting yourself in some way. 0   If you checked off any problems, how difficult have these problems made it for you to do your work, take care of things at home, or get along with other people? Somewhat difficult   PHQ 9 Total 15   Interpretation of Total Score Moderate/Severe depression   Demetrios Isaacs, [place]  03/09/2018, 09:30

## 2018-03-10 ENCOUNTER — Ambulatory Visit: Payer: MEDICAID | Attending: Urology | Admitting: Urology

## 2018-03-10 ENCOUNTER — Encounter (INDEPENDENT_AMBULATORY_CARE_PROVIDER_SITE_OTHER): Payer: Self-pay | Admitting: Urology

## 2018-03-10 VITALS — BP 110/64 | HR 54 | Temp 95.7°F | Ht 65.0 in | Wt 189.8 lb

## 2018-03-10 DIAGNOSIS — R300 Dysuria: Secondary | ICD-10-CM | POA: Insufficient documentation

## 2018-03-10 DIAGNOSIS — R3915 Urgency of urination: Secondary | ICD-10-CM

## 2018-03-10 DIAGNOSIS — R309 Painful micturition, unspecified: Secondary | ICD-10-CM

## 2018-03-10 DIAGNOSIS — R319 Hematuria, unspecified: Secondary | ICD-10-CM

## 2018-03-10 DIAGNOSIS — Z87440 Personal history of urinary (tract) infections: Secondary | ICD-10-CM

## 2018-03-10 DIAGNOSIS — Z87891 Personal history of nicotine dependence: Secondary | ICD-10-CM | POA: Insufficient documentation

## 2018-03-10 DIAGNOSIS — R350 Frequency of micturition: Secondary | ICD-10-CM

## 2018-03-10 DIAGNOSIS — N3946 Mixed incontinence: Secondary | ICD-10-CM

## 2018-03-10 LAB — VITAMIN D 25, TOTAL: VITAMIN D, 25OH: 19 ng/mL — ABNORMAL LOW (ref 30–100)

## 2018-03-10 NOTE — H&P (Signed)
CHIEF COMPLAINT: Bladder leakage and recurrent UTI's      HPI: 62 y.o. WF referred for evaluation of urinary urgency, frequency, painful urination and "leakage of urine" after she urinates. Also C/O upper back pain when she urinates, "not all the time".    Also C/O two UTI's over the past year. She stated that she was told that she had microscopic blood in her urine. Denied gross hematuria.    C/O urinary incontinence with "bending over", laughing or coughing.  Three vaginal deliveries in her lifetime.    Patient counseled and case discussed regarding the need for urologic work-up. She agreed.      Past Medical History:   Diagnosis Date   . Arthritis    . Cancer (CMS HCC)     internal skin cancer   . Depression    . GERD (gastroesophageal reflux disease)     controlled w/med   . Heart murmur 1979    benign   . Hyperlipidemia LDL goal < 100 08/29/2013   . MINOR CAD (coronary artery disease) 11/10/2012    no treatment other than cholesterol medications; follows regularly with Dr. Rodney Booze   . Multinodular goiter    . Rheumatic fever 16    no complications   . Shortness of breath     on exertion   . Squamous cell carcinoma 02/06/2015    right side of neck   . Vaginal prolapse 2012    surgery improved significantly   . Wears glasses            Past Surgical History:   Procedure Laterality Date   . COLONOSCOPY  01/06/09    COLONOSCOPY performed by Loletta Parish F at [facility]   . GASTROSCOPY  03/05/2010    GASTROSCOPY performed by Jocelyn Lamer at [facility]   . GASTROSCOPY WITH BIOPSY  03/05/2010    GASTROSCOPY WITH BIOPSY performed by Jocelyn Lamer at [facility] [place]   . HX ADENOIDECTOMY     . HX ANKLE FRACTURE [place]  2007    left distal fibula, casted   . HX APPENDECTOMY     . HX CHOLECYSTECTOMY     . HX COLONOSCOPY     . HX CYSTOCELE REPAIR  09/24/09   . HX HAND SURGERY  2012    for Carpal tunnel    . HX OOPHORECTOMY      left ovary removed   . HX TONSILLECTOMY     . HX TOTAL VAGINAL HYSTERECTOMY  1979   . HX WRIST FRACTURE [place]   2007    [place] injury   . PB REVISE ULNAR NERVE AT ELBOW  1979   . PB UPPER GI ENDOSCOPY,BIOPSY  12/19/07    patulous GE junction zone, erythema, nonerosive GERD         Allergies   Allergen Reactions   . Cymbalta [Duloxetine]      Rash on feet and hands   . Blue Dye      Told to avoid due to patch test result by Simone Curia   . Lipitor [Atorvastatin]      Muscle pain   . Nickel    . Codeine Itching     RASH   . Mobic [Meloxicam] Nausea/ Vomiting   . Naprosyn [Naproxen] Nausea/ Vomiting       LMP: N/A      Outpatient Medications Prior to Visit:  aspirin 81 mg Oral Tablet, Chewable Take 1 Tab (81 mg total) by mouth Once  a day   Azelaic Acid 20 % Cream Apply topically Twice daily   Clobetasol (TEMOVATE) 0.05 % Ointment Apply topically Twice daily To rash on hands and feet   FLUoxetine (SARAFEM) 20 mg Oral Tablet Take 1 Tab (20 mg total) by mouth Twice daily   gabapentin (NEURONTIN) 300 mg Oral Capsule Take 2 Caps (600 mg total) by mouth Once a day   Halobetasol Propionate (ULTRAVATE) 0.05 % Cream Apply  topically. Three times a week   Metronidazole (METROCREAM) 0.75 % Cream Apply topically Twice daily   mineral oil-hydrophil petrolat (AQUAPHOR) Ointment by Apply Topically route Four times a day as needed   nitroGLYCERIN (NITROSTAT) 0.4 mg Sublingual Tablet, Sublingual 1 Tab (0.4 mg total) by Sublingual route Every 5 minutes as needed for Chest pain for up to 3 doses for 3 doses over 15 minutes   nystatin (NYSTOP) 100,000 unit/gram Powder Apply once or twice daily to PREVENT rash under breasts   pantoprazole (PROTONIX) 40 mg Oral Tablet, Delayed Release (E.C.) Take 1 Tab (40 mg total) by mouth Every morning before breakfast   triamcinolone acetonide (ARISTOCORT A) 0.1 % Ointment by Apply Topically route Twice daily     No facility-administered medications prior to visit.     Social History     Socioeconomic History   . Marital status: Divorced     Spouse name: Not on file   . Number of children: Not on file   . Years of  education: Not on file   . Highest education level: Not on file   Occupational History   . 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 Financial resource strain: Not on file   . Food insecurity:     Worry: Not on file     Inability: Not on file   . Transportation needs:     Medical: Not on file     Non-medical: Not on file   Tobacco Use   . Smoking status: Former Smoker     Packs/day: 2.00     Years: 40.00     Pack years: 80.00     Types: Cigarettes     Last attempt to quit: 01/22/2005     Years since quitting: 13.1   . Smokeless tobacco: Never Used   Substance and Sexual Activity   . Alcohol use: No     Alcohol/week: 0.0 oz   . Drug use: No   . Sexual activity: Not on file   Lifestyle   . Physical activity:     Days per week: Not on file     Minutes per session: Not on file   . Stress: Not on file   Relationships   . Social connections:     Talks on phone: Not on file     Gets together: Not on file     Attends religious service: Not on file     Active member of club or organization: Not on file     Attends meetings of clubs or organizations: Not on file     Relationship status: Not on file   . Intimate partner violence:     Fear of current or ex partner: Not on file     Emotionally abused: Not on file     Physically abused: Not on file     Forced sexual activity: Not on file   Other Topics Concern   . Abuse/Domestic  Violence Not Asked   . Breast Self Exam Not Asked   . Caffeine Concern Not Asked   . Calcium intake adequate Not Asked   . Computer Use Not Asked   . Exercise Concern Not Asked   . Helmet Use Not Asked   . Seat Belt Not Asked   . Special Diet Not Asked   . Sunscreen used Not Asked   . Uses Cane No   . Uses walker No   . Uses wheelchair No   . Right hand dominant Yes   . Left hand dominant No   . Ambidextrous No   . Ability to Walk 1 Flight of Steps without SOB/CP Yes   . Routine Exercise Yes     Comment: walk   . Ability to Walk 2 Flight of  Steps without SOB/CP Yes   . Unable to Ambulate Not Asked   . Total Care Not Asked   . Ability To Do Own ADL's Yes   . Uses Walker Not Asked   . Other Activity Level Not Asked   . Uses Cane Not Asked   . Drives Not Asked   . Shift Work No   . Unusual Sleep-Wake Schedule No   Social History Narrative   . Not on file       Family Medical History:     Problem Relation (Age of Onset)    Bipolar Disorder Daughter    Breast Cancer Paternal Aunt, Paternal Aunt, Paternal Aunt, Paternal 82, Paternal 30, Other    Cancer Paternal Aunt, Paternal 22, Paternal 2, Paternal 87, Paternal 21, Other (5)    Congestive Heart Failure Father (26)    Coronary Artery Disease Father, Mother (66)    Diabetes Mother    Heart Attack Father, Sister    High Cholesterol Mother    Hypertension (High Blood Pressure) Mother, Brother, Sister, Sister    Kidney Disease Sister    Leukemia Paternal Uncle, Paternal Uncle    Stroke Paternal Grandmother    Thyroid Disease Sister          SHx:  Unemployed, non-smoker, no ETOH      ROS:   All other systems negative.                        General/Constitutional-Neg.      Skin/Breast-Neg.       Eyes/Ears/Nose/Mouth/Throat-Neg.      Cardiovascular-Neg.      Respiratory -Neg.                        Gastrointestinal-Neg.                        Genitourinary -See HPI            Ob-Gyn- Neg.             Hematologic -Neg.       Endocrine -Neg.                         Musculoskeletal -Neg.                         Neurologic-Neg.                         Psychiatric -Neg.  Allergic/Immunologic -Neg.      OBJECTIVE:               Vital Signs-   BP             P              RR            T    BP 110/64   Pulse 54   Temp 35.4 C (95.7 F) (Thermal Scan)   Ht 1.651 m (5\' 5" )   Wt 86.1 kg (189 lb 13.1 oz)   SpO2 98%   BMI 31.59 kg/m                          Constitutional- Slightly obese lady in NAD, no weight loss, no fevers                         Skin- No rashes, no  ulcerations                         Eyes- Grossly intact, eyes clear                         Respiratory- Unlabored breathing                                       Genitourinary- Deferred                         Rectal- Deferred                         Musculoskeletal: Grossly intact, FROM x 4                           LABS:          U/A-    Urine Dip Results:            ASSESSMENT:   1) Mixed urinary incontinence   2) Recurrent UTI's   3) H/O microhematuria?      PLAN:  1) Schedule CT IVP, complex CMG, cystoscopy, vaginal speculum exam, pelvic bimanual examination on 2 [place].   2) Re-assess after studies.      I spent greater than 50% of 15 minute visit in counseling and or discussion of the need for urologic work-up .    Thurmon Fair, MD  03/10/2018, 13:48    Thurmon Fair, Brooke Bonito., MD, FACS  Associate Program Director, Urology  Associate Professor of [place] Department of Urology

## 2018-03-21 ENCOUNTER — Encounter (HOSPITAL_BASED_OUTPATIENT_CLINIC_OR_DEPARTMENT_OTHER): Payer: Self-pay

## 2018-03-21 ENCOUNTER — Ambulatory Visit (HOSPITAL_BASED_OUTPATIENT_CLINIC_OR_DEPARTMENT_OTHER): Payer: MEDICAID

## 2018-03-21 ENCOUNTER — Ambulatory Visit
Admission: RE | Admit: 2018-03-21 | Discharge: 2018-03-21 | Disposition: A | Discharge status: Home / Self Care | Payer: MEDICAID | Source: Ambulatory Visit | Attending: Family Medicine | Admitting: Family Medicine

## 2018-03-21 VITALS — BP 100/66 | HR 93 | Temp 97.7°F | Resp 20 | Ht 65.0 in | Wt 190.9 lb

## 2018-03-21 DIAGNOSIS — M50323 Other cervical disc degeneration at C6-C7 level: Secondary | ICD-10-CM

## 2018-03-21 DIAGNOSIS — Z79899 Other long term (current) drug therapy: Secondary | ICD-10-CM | POA: Insufficient documentation

## 2018-03-21 DIAGNOSIS — M79601 Pain in right arm: Principal | ICD-10-CM

## 2018-03-21 DIAGNOSIS — M25521 Pain in right elbow: Secondary | ICD-10-CM | POA: Insufficient documentation

## 2018-03-21 DIAGNOSIS — M50322 Other cervical disc degeneration at C5-C6 level: Secondary | ICD-10-CM

## 2018-03-21 MED ORDER — ACETAMINOPHEN 325 MG TABLET
650.00 mg | ORAL_TABLET | Freq: Three times a day (TID) | ORAL | 0 refills | Status: DC
Start: 2018-03-21 — End: 2018-03-21

## 2018-03-21 MED ORDER — ACETAMINOPHEN 325 MG TABLET
650.00 mg | ORAL_TABLET | Freq: Three times a day (TID) | ORAL | 0 refills | Status: DC
Start: 2018-03-21 — End: 2019-05-14

## 2018-03-21 MED ORDER — ACETAMINOPHEN 325 MG TABLET
325.00 mg | ORAL_TABLET | ORAL | 0 refills | Status: DC | PRN
Start: 2018-03-21 — End: 2018-03-21

## 2018-03-21 NOTE — Progress Notes (Signed)
[facility]   Department of Family Medicine   Clinic Progress Note    [name]  MRN: [id number]  DOB: 06-Mar-1956  Date of Service: 03/21/2018    CHIEF COMPLAINT  Chief Complaint   Patient presents with   . Right Arm Pain     first started elbow shooting pain to fingers - went to [facility] and now its shooting pain from back to shoulder to hand into each finger. No XR taken at [place].       SUBJECTIVE  [name] is a 62 y.o. female who presents to clinic for an acute visit.    Right arm pain    Started 2-3 weeks ago has right elbow pain. Pain was on the lateral side of her elbow and did not radiate at that time    She is now having shooting pains into her shoulder, back and neck and down intop her hands, affecting all fingers and thumb   Pain is worse with any movement of her arm and at imes of her neck    [facility] and was given lidocaine patches. These have been taking the edge off   Recently moved and has been lifting heave boxes. Otherwise denies any injury to her arm   Has been taking OTC ibuprofen 2 tabs at night    Also on gabapentin for other neuropathy and it has not been helping. She has tried to take an extra one and it has not helped.     Has been having trouble with grip as well           Review of Systems:  As per HPI, otherwise 10 point ROS is negative    Medications:     Outpatient Medications Prior to Visit:  aspirin 81 mg Oral Tablet, Chewable Take 1 Tab (81 mg total) by mouth Once a day   Azelaic Acid 20 % Cream Apply topically Twice daily   Clobetasol (TEMOVATE) 0.05 % Ointment Apply topically Twice daily To rash on hands and feet   FLUoxetine (SARAFEM) 20 mg Oral Tablet Take 1 Tab (20 mg total) by mouth Twice daily   gabapentin (NEURONTIN) 300 mg Oral Capsule Take 2 Caps (600 mg total) by mouth Once a day   Halobetasol Propionate (ULTRAVATE) 0.05 % Cream Apply  topically. Three times a week   Metronidazole (METROCREAM) 0.75 % Cream Apply  topically Twice daily   mineral oil-hydrophil petrolat (AQUAPHOR) Ointment by Apply Topically route Four times a day as needed (Patient not taking: Reported on 03/21/2018)   nitroGLYCERIN (NITROSTAT) 0.4 mg Sublingual Tablet, Sublingual 1 Tab (0.4 mg total) by Sublingual route Every 5 minutes as needed for Chest pain for up to 3 doses for 3 doses over 15 minutes   nystatin (NYSTOP) 100,000 unit/gram Powder Apply once or twice daily to PREVENT rash under breasts   pantoprazole (PROTONIX) 40 mg Oral Tablet, Delayed Release (E.C.) Take 1 Tab (40 mg total) by mouth Every morning before breakfast   triamcinolone acetonide (ARISTOCORT A) 0.1 % Ointment by Apply Topically route Twice daily     No facility-administered medications prior to visit.     Allergies:   Allergies   Allergen Reactions   . Cymbalta [Duloxetine]      Rash on feet and hands   . Blue Dye      Told to avoid due to patch test result by Simone Curia   . Lipitor [Atorvastatin]  Muscle pain   . Nickel    . Codeine Itching     RASH   . Mobic [Meloxicam] Nausea/ Vomiting   . Naprosyn [Naproxen] Nausea/ Vomiting         OBJECTIVE  BP 100/66   Pulse 93   Temp 36.5 C (97.7 F) (Thermal Scan)   Resp 20   Ht 1.651 m (5\' 5" )   Wt 86.6 kg (190 lb 14.7 oz)   SpO2 96%   BMI 31.77 kg/m   General: no distress  HENT: normocephalic, atraumatic; clear, mouth mucous membranes moist,  Lungs: clear to auscultation bilaterally  Cardiovascular: RRR, no murmur  Extremities: no cyanosis or edema  MSK:   Right Arm: swelling noted of forearm, no bruising, TTP all along her upper extremity   Neck: no cervical spine tenderness, some paraspinal muscle tightness noted   Skin: warm and dry, no rash  Neurologic: gait is normal, AOx3, CN 2-12 grossly intact  Psychiatric: normal affect and behavior    ASSESSMENT/PLAN  [name] is a 62 y.o. female presenting for an acute visit    (M79.601) Right arm pain  (primary encounter diagnosis)  Plan:  NCS/EMG, XR CERVICAL SPINE  SERIES W FLEX EXT         This seems to be due to nerve pain but it is unclear which nerve is aggravated and from where based on physical exam. Will get the above tests to look for source. In the meantime, recommended alternating tylenol and ibuprofen for pain control and can continue to use lidocaine patches. Recommended rest as well.       Orders Placed This Encounter   . XR CERVICAL SPINE SERIES W FLEX EXT   . NCS/EMG   . acetaminophen (TYLENOL) 325 mg Oral Tablet     Return in about 6 weeks (around 05/02/2018) for arm pain .      Erick Blinks, MD 03/21/2018, 11:17       Prior to the patient's discharge today, I saw and examined the patient. I agree with the resident's/fellow's/nurse practitioner's//trainer's assessment and plan. Any exceptions are noted. I have reviewed and discussed the prevention plan with the patient.    Mackie Pai, MD  Associate Professor  Director, Division of Sports Medicine  [facility]pt [facility] [place] [facility]  Operated by [facility]

## 2018-03-26 ENCOUNTER — Other Ambulatory Visit (HOSPITAL_BASED_OUTPATIENT_CLINIC_OR_DEPARTMENT_OTHER): Payer: Self-pay | Admitting: Family Medicine

## 2018-03-26 DIAGNOSIS — F331 Major depressive disorder, recurrent, moderate: Secondary | ICD-10-CM

## 2018-03-27 ENCOUNTER — Ambulatory Visit (HOSPITAL_BASED_OUTPATIENT_CLINIC_OR_DEPARTMENT_OTHER): Payer: Self-pay | Admitting: LICENSED CLINICAL SOCIAL WORKER

## 2018-03-27 MED ORDER — FLUOXETINE 20 MG TABLET
20.00 mg | ORAL_TABLET | Freq: Two times a day (BID) | ORAL | 5 refills | Status: DC
Start: 2018-03-27 — End: 2018-05-10

## 2018-03-27 NOTE — Telephone Encounter (Signed)
RX approved and encounter closed

## 2018-03-29 ENCOUNTER — Other Ambulatory Visit (HOSPITAL_COMMUNITY): Payer: Self-pay

## 2018-03-29 ENCOUNTER — Ambulatory Visit (HOSPITAL_COMMUNITY): Payer: Self-pay | Admitting: Student in an Organized Health Care Education/Training Program

## 2018-04-03 ENCOUNTER — Encounter (HOSPITAL_BASED_OUTPATIENT_CLINIC_OR_DEPARTMENT_OTHER): Payer: Self-pay | Admitting: Family Medicine

## 2018-04-03 ENCOUNTER — Ambulatory Visit
Admission: RE | Admit: 2018-04-03 | Discharge: 2018-04-03 | Disposition: A | Discharge status: Home / Self Care | Payer: MEDICAID | Source: Ambulatory Visit | Attending: Family Medicine | Admitting: Family Medicine

## 2018-04-03 ENCOUNTER — Ambulatory Visit (HOSPITAL_BASED_OUTPATIENT_CLINIC_OR_DEPARTMENT_OTHER): Payer: MEDICAID | Admitting: Family Medicine

## 2018-04-03 ENCOUNTER — Encounter (HOSPITAL_BASED_OUTPATIENT_CLINIC_OR_DEPARTMENT_OTHER): Payer: Self-pay

## 2018-04-03 ENCOUNTER — Other Ambulatory Visit (HOSPITAL_BASED_OUTPATIENT_CLINIC_OR_DEPARTMENT_OTHER): Payer: Self-pay

## 2018-04-03 VITALS — BP 112/66 | HR 102 | Temp 97.5°F | Resp 16 | Ht 65.0 in | Wt 192.2 lb

## 2018-04-03 DIAGNOSIS — R51 Headache: Secondary | ICD-10-CM | POA: Insufficient documentation

## 2018-04-03 DIAGNOSIS — E559 Vitamin D deficiency, unspecified: Secondary | ICD-10-CM

## 2018-04-03 DIAGNOSIS — Z1211 Encounter for screening for malignant neoplasm of colon: Secondary | ICD-10-CM | POA: Insufficient documentation

## 2018-04-03 DIAGNOSIS — M25521 Pain in right elbow: Secondary | ICD-10-CM | POA: Insufficient documentation

## 2018-04-03 DIAGNOSIS — F329 Major depressive disorder, single episode, unspecified: Secondary | ICD-10-CM | POA: Insufficient documentation

## 2018-04-03 DIAGNOSIS — Z87891 Personal history of nicotine dependence: Secondary | ICD-10-CM | POA: Insufficient documentation

## 2018-04-03 DIAGNOSIS — F4310 Post-traumatic stress disorder, unspecified: Secondary | ICD-10-CM | POA: Insufficient documentation

## 2018-04-03 DIAGNOSIS — R519 Headache, unspecified: Secondary | ICD-10-CM

## 2018-04-03 DIAGNOSIS — G8929 Other chronic pain: Secondary | ICD-10-CM | POA: Insufficient documentation

## 2018-04-03 DIAGNOSIS — Z7982 Long term (current) use of aspirin: Secondary | ICD-10-CM | POA: Insufficient documentation

## 2018-04-03 DIAGNOSIS — Z79899 Other long term (current) drug therapy: Secondary | ICD-10-CM | POA: Insufficient documentation

## 2018-04-03 MED ORDER — CHOLECALCIFEROL (VITAMIN D3) 25 MCG (1,000 UNIT) TABLET
1000.0000 [IU] | ORAL_TABLET | Freq: Every day | ORAL | 3 refills | Status: DC
Start: 2018-04-03 — End: 2018-08-08

## 2018-04-03 NOTE — Progress Notes (Signed)
Department of Family Medicine    [name]  DOB: [birth year], 62 y.o.   MRN: [id number]  Date of encounter: 04/03/2018    Chief Complaint   Patient presents with   . Facial Pain     follow up     Subjective:    62 y.o. female presenting for follow up on multiple medical concerns.    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  - taking Gabapentin 600mg  daily, will need refill May 25, 2018  - improved overall    Right Arm Pain  - seen for acute visit 03/21/18- 2-3wks of shooting pain on lateral side of elbow, could move into shoulder/bak/neck/down to hands and all fingers  - XR C Spine 03/21/18- mild multilevel degenerative disc and facet changes, most pronounced C5-C7  - NCS/EMG was ordered 03/21/18- not yet scheduled  - exercising arm without difficulty but shooting pain persists  - denies trauma to right arm    MDD  PTSD  - PHQ9 -13, improved from 03/09/18 (score of 15)  - denies SI/HI  - increased fatigue, poor sleep   - compliant with Prozac 20mg  BID  - new patient appointment with behavioral med 05/10/18      Med reconciliation completed in today's visit. No Refills needed.   Medical, familial, surgical, and social history updated in EMR where appropriate.     Allergies   Allergen Reactions   . Cymbalta [Duloxetine]      Rash on feet and hands   . Blue Dye      Told to avoid due to patch test result by Simone Curia   . Lipitor [Atorvastatin]      Muscle pain   . Nickel    . Codeine Itching     RASH   . Mobic [Meloxicam] Nausea/ Vomiting   . Naprosyn [Naproxen] Nausea/ Vomiting     Current Outpatient Medications   Medication Sig   . acetaminophen (TYLENOL) 325 mg Oral Tablet Take 2 Tabs (650 mg total) by mouth Three times a day   . aspirin 81 mg Oral Tablet, Chewable Take 1 Tab (81 mg total) by mouth Once a day   . Azelaic Acid 20 % Cream Apply topically Twice daily   . Clobetasol (TEMOVATE) 0.05 % Ointment Apply topically Twice daily To rash on hands and feet   .  FLUoxetine (SARAFEM) 20 mg Oral Tablet Take 1 Tab (20 mg total) by mouth Twice daily   . gabapentin (NEURONTIN) 300 mg Oral Capsule Take 2 Caps (600 mg total) by mouth Once a day   . Halobetasol Propionate (ULTRAVATE) 0.05 % Cream Apply  topically. Three times a week   . Metronidazole (METROCREAM) 0.75 % Cream Apply topically Twice daily   . mineral oil-hydrophil petrolat (AQUAPHOR) Ointment by Apply Topically route Four times a day as needed (Patient not taking: Reported on 03/21/2018)   . nitroGLYCERIN (NITROSTAT) 0.4 mg Sublingual Tablet, Sublingual 1 Tab (0.4 mg total) by Sublingual route Every 5 minutes as needed for Chest pain for up to 3 doses for 3 doses over 15 minutes   . nystatin (NYSTOP) 100,000 unit/gram Powder Apply once or twice daily to PREVENT rash under breasts   . pantoprazole (PROTONIX) 40 mg Oral Tablet, Delayed Release (E.C.) Take 1 Tab (40 mg total) by mouth Every morning before breakfast   . triamcinolone acetonide (ARISTOCORT A) 0.1 % Ointment by Apply Topically route Twice daily     Review  of Systems: Positive ROS discussed in HPI, otherwise all other systems negative.  Social History     Tobacco Use   . Smoking status: Former Smoker     Packs/day: 2.00     Years: 40.00     Pack years: 80.00     Types: Cigarettes     Last attempt to quit: 01/22/2005     Years since quitting: 13.2   . Smokeless tobacco: Never Used   Substance Use Topics   . Alcohol use: No     Alcohol/week: 0.0 oz   . Drug use: No     Objective:  Blood pressure 112/66, pulse (!) 102, temperature 36.4 C (97.5 F), temperature source Thermal Scan, resp. rate 16, height 1.651 m (5\' 5" ), weight 87.2 kg (192 lb 3.9 oz), SpO2 97 %, not currently breastfeeding.Body mass index is 31.99 kg/m.    Constitutional: appears stated age, in no acute distress  Eyes: pupils equal and round  Neck: supple, symmetric, trachea midline  Respiratory: non-labored breathing, clear to auscultation bilaterally  Cardiovascular: regular rate and rhythm,  no murmurs/gallops/rubs  Abdomen: soft, non-distended, non-tender, no guarding  Extremities: no cyanosis or edema, right lateral elbow tender to palpation  Skin: warm and dry, no rash  Neuro: grossly normal  Psychiatric: normal affect and behavior      Assessment/Plan:  [name] 62 y.o. female     Chronic Right Facial Pain  - stable  - continue Gabapentin    Right Arm Pain  - XR imaging ordered due to trauma  - referral to PT  - EMG ordered by another provider, pending scheduling    MDD  PTSD  - improving  - continue Prozac    Vitamin D Deficiency  - Vitamin D 19 on 03/09/18  - start Vitamin D 1000 units QD  - will monitor by rechecking levels in 3 months    Preventative  Colonoscopy- referral placed  Mammogram- 02/23/18 normal      RTC 3 months, follow up on vitamin D- labs, colonoscopy referral placed, elbow pain and PT referral    Dennison Mascot, MD  PGY-1 Family Medicine        I saw and examined the patient.  I reviewed the resident's note.  I agree with the findings and plan of care as documented in the resident's note.  Any exceptions/additions are edited/noted.    Verline Lema, MD

## 2018-04-04 ENCOUNTER — Other Ambulatory Visit (HOSPITAL_BASED_OUTPATIENT_CLINIC_OR_DEPARTMENT_OTHER): Payer: Self-pay | Admitting: Family Medicine

## 2018-04-04 DIAGNOSIS — M542 Cervicalgia: Secondary | ICD-10-CM

## 2018-04-05 NOTE — Telephone Encounter (Signed)
Received refill request from pharmacy for Gabapentin 300mg , take 2 caps by mouth once daily. Refused automatic refill request. Patient was seen in clinic recently and provided a printed prescription for 2 month supply, not to be filled prior to March 25, 2018.     Dennison Mascot, MD  [facility]  Department of [facility]  PGY-1

## 2018-04-05 NOTE — Telephone Encounter (Signed)
Regarding: refill request  ----- Message from Flonnie Hailstone II sent at 04/05/2018 12:28 PM EDT -----  Dennison Mascot, MD    Pharmacy calling in to request refill of the following medication. Thank you.    gabapentin (NEURONTIN) 300 mg Oral Capsule 60 Cap 1 03/09/2018    Sig - Route: Take 2 Caps (600 mg total) by mouth Once a day - Oral   Class: Print   Notes to Pharmacy: Do Not Fill Before March 25, 2018     Preferred Pharmacy     [id number] - [place], [place] - [address]    [address] [place] [address]    Phone: [phone] Fax: [phone]    Not a 24 hour pharmacy; exact hours not known.

## 2018-04-10 ENCOUNTER — Encounter (HOSPITAL_BASED_OUTPATIENT_CLINIC_OR_DEPARTMENT_OTHER): Payer: Self-pay | Admitting: Family Medicine

## 2018-04-10 ENCOUNTER — Ambulatory Visit: Payer: MEDICAID | Attending: LICENSED CLINICAL SOCIAL WORKER | Admitting: LICENSED CLINICAL SOCIAL WORKER

## 2018-04-10 DIAGNOSIS — F329 Major depressive disorder, single episode, unspecified: Secondary | ICD-10-CM | POA: Insufficient documentation

## 2018-04-10 DIAGNOSIS — F321 Major depressive disorder, single episode, moderate: Secondary | ICD-10-CM

## 2018-04-11 NOTE — Progress Notes (Signed)
FAMILY MEDICINE, [facility]  Operated by [facility]  [place] [address]  Dept: [phone]  Dept Fax: [phone]     [facility] MED    PATIENT NAME: Cathey Fredenburg   CHART NUMBER: [id number]   DATE OF BIRTH: 02/04/1956   DATE OF SERVICE: 04/10/2018  REFERRING PHYSICIAN:  Dr. Stephanie Coup  PROCEDURE: Individual therapy   TIME IN/OUT:     1:35 Pm- 2:05 PM       TOTAL TIME SPENT: 30 mins  SESSION # 1    PRIMARY CARE PSYCHOLOGY PROGRESS NOTE    SUBJECTIVE: [place] Baldwin reported that her mood has improved.  She likes where she is living and has begun to socialize with a neighbor.  She stated she has been increasingly frustrated and stressed as her HUD application was not processed in a timely manner.  Patient reported she advocated for herself and was able to get her housing voucher.She continues to have limited resources to do things she enjoys.  Patient stated she has multiple doctor's appointments and she is having increased pain in her arm.  She stated her sleep is poor.        OBJECTIVE:   The patient was a well-groomed, polite and cooperative white female. Rapport was easily established and maintained. Affect was flat. Speech and thought process were linear and coherent and thought content was devoid of psychotic features. Eye contact was appropriate. Mood was described as depressed . Concentration and memory are within normal limits. Patient displayed good judgment and insight. Suicidal ideation was denied. There was no evidence of mania, hallucinations, delusions, paranoia, obsessions, compulsions, or homicidal ideation.      DIAGNOSTIC IMPRESSION:   Depression    PROCEDURES/PLAN:   Praised patient for advocating for herself and obtaining her HUD vouchure. Discussed with patient behavior activation and developed a behavior activation plan.  Patient agreed to increase acuities outside her apartment. Patient was agreeable to exercising 2x/week.  Patient was agreeable to these recommendations and plans to return to clinic in 3 weeks to assess symptom status and progress toward goals. At the next session, we will continue CBT skills. Further treatment is warranted to obtain treatment goals. Prognosis is fair.        Samuella Bruin, MSW, [place]  Operated by [facility]  [place] [address]  Dept: [phone]  Dept Fax: [phone]

## 2018-05-01 ENCOUNTER — Other Ambulatory Visit (HOSPITAL_BASED_OUTPATIENT_CLINIC_OR_DEPARTMENT_OTHER): Payer: MEDICAID

## 2018-05-01 ENCOUNTER — Encounter (HOSPITAL_COMMUNITY): Payer: Self-pay

## 2018-05-01 ENCOUNTER — Ambulatory Visit
Admission: RE | Admit: 2018-05-01 | Discharge: 2018-05-01 | Disposition: A | Discharge status: Home / Self Care | Payer: MEDICAID | Source: Ambulatory Visit | Attending: Urology | Admitting: Urology

## 2018-05-01 ENCOUNTER — Encounter (HOSPITAL_COMMUNITY)
Admission: RE | Disposition: A | Discharge status: Home / Self Care | Payer: Self-pay | Source: Ambulatory Visit | Attending: Urology

## 2018-05-01 ENCOUNTER — Ambulatory Visit (HOSPITAL_BASED_OUTPATIENT_CLINIC_OR_DEPARTMENT_OTHER): Payer: MEDICAID | Admitting: Urology

## 2018-05-01 ENCOUNTER — Other Ambulatory Visit (HOSPITAL_COMMUNITY): Payer: Self-pay | Admitting: Urology

## 2018-05-01 DIAGNOSIS — R319 Hematuria, unspecified: Secondary | ICD-10-CM

## 2018-05-01 DIAGNOSIS — N3289 Other specified disorders of bladder: Secondary | ICD-10-CM

## 2018-05-01 DIAGNOSIS — Z85828 Personal history of other malignant neoplasm of skin: Secondary | ICD-10-CM | POA: Insufficient documentation

## 2018-05-01 DIAGNOSIS — R339 Retention of urine, unspecified: Secondary | ICD-10-CM | POA: Insufficient documentation

## 2018-05-01 DIAGNOSIS — E785 Hyperlipidemia, unspecified: Secondary | ICD-10-CM | POA: Insufficient documentation

## 2018-05-01 DIAGNOSIS — N390 Urinary tract infection, site not specified: Secondary | ICD-10-CM | POA: Insufficient documentation

## 2018-05-01 DIAGNOSIS — Z7982 Long term (current) use of aspirin: Secondary | ICD-10-CM | POA: Insufficient documentation

## 2018-05-01 DIAGNOSIS — Z79899 Other long term (current) drug therapy: Secondary | ICD-10-CM | POA: Insufficient documentation

## 2018-05-01 DIAGNOSIS — Z87891 Personal history of nicotine dependence: Secondary | ICD-10-CM | POA: Insufficient documentation

## 2018-05-01 DIAGNOSIS — N3946 Mixed incontinence: Secondary | ICD-10-CM | POA: Insufficient documentation

## 2018-05-01 DIAGNOSIS — I10 Essential (primary) hypertension: Secondary | ICD-10-CM | POA: Insufficient documentation

## 2018-05-01 DIAGNOSIS — N3941 Urge incontinence: Secondary | ICD-10-CM

## 2018-05-01 DIAGNOSIS — K219 Gastro-esophageal reflux disease without esophagitis: Secondary | ICD-10-CM | POA: Insufficient documentation

## 2018-05-01 DIAGNOSIS — Z87440 Personal history of urinary (tract) infections: Secondary | ICD-10-CM

## 2018-05-01 DIAGNOSIS — N3281 Overactive bladder: Secondary | ICD-10-CM

## 2018-05-01 DIAGNOSIS — F329 Major depressive disorder, single episode, unspecified: Secondary | ICD-10-CM | POA: Insufficient documentation

## 2018-05-01 SURGERY — CYSTOSCOPY WITH CYSTOMETROGRAM AND URODYNAMICS
Anesthesia: Local (Nurse-Monitored) | Site: Pelvis | Wound class: Clean Contaminated Wounds-The respiratory, GI, Genital, or urinary

## 2018-05-01 MED ORDER — OXYBUTYNIN CHLORIDE 5 MG TABLET
5.00 mg | ORAL_TABLET | Freq: Two times a day (BID) | ORAL | 2 refills | Status: AC | PRN
Start: 2018-05-01 — End: 2018-05-31

## 2018-05-01 MED ORDER — SULFAMETHOXAZOLE 800 MG-TRIMETHOPRIM 160 MG TABLET
1.0000 | ORAL_TABLET | Freq: Once | ORAL | Status: AC
Start: 2018-05-01 — End: 2018-05-01
  Administered 2018-05-01: 160 mg via ORAL
  Filled 2018-05-01: qty 1

## 2018-05-01 MED ORDER — IOVERSOL 320 MG IODINE/ML INTRAVENOUS SOLUTION
100.00 mL | INTRAVENOUS | Status: AC
Start: 2018-05-01 — End: 2018-05-01
  Administered 2018-05-01 (×2): 100 mL via INTRAVENOUS

## 2018-05-01 MED ORDER — FUROSEMIDE 10 MG/ML INJECTION SOLUTION
10.0000 mg | Freq: Once | INTRAMUSCULAR | Status: AC
Start: 2018-05-01 — End: 2018-05-01
  Administered 2018-05-01: 10 mg via INTRAVENOUS
  Filled 2018-05-01: qty 2

## 2018-05-01 MED ORDER — LIDOCAINE 2 % MUCOSAL JELLY IN APPLICATOR
10.0000 mL | Freq: Once | Status: DC | PRN
Start: 2018-05-01 — End: 2018-05-01
  Administered 2018-05-01: 10 mL via URETHRAL

## 2018-05-01 SURGICAL SUPPLY — 34 items
BASIN MED GRAD 32OZ BLU STRL (MISCELLANEOUS PT CARE ITEMS)
CATH URETH DOVER RBNL 12FR 16IN 2 STGR DRAIN EYE RND CLS TIP INT FNL CONN PVC STRL LF  DISP (UROLOGICAL SUPPLIES) IMPLANT
CATH URETH DV RBNL 12FR 16IN 2_STGR DRAIN EYE RND CLS TIP (UROLOGICAL SUPPLIES)
CATH URODY 7FR VAG RCTL ABD (UROLOGICAL SUPPLIES) ×1
CATH URODY 7FR VAGINAL RECTAL ABD (UROLOGICAL SUPPLIES) ×2 IMPLANT
CATH URODY AIR-CHRG 7FR 19.7IN SENSOR LUM BLADDER SHAFT URETHRA PE DISP (UROLOGICAL SUPPLIES) ×2 IMPLANT
CATH URODY AIR-CHRG 7FR 19.7IN_1 SNSR LUM BLA SHFT URETHRA (UROLOGICAL SUPPLIES) ×1
CATH URODY TDOC AIR-CHRG 7FR COUDE SENSOR INFUS PORT BAL PRESS BLADDER DISP (VASCULAR) IMPLANT
CATH URODY TDOC AIR-CHRG 7FR C_DE SNSR INFS PORT BAL PRESS (VASCULAR)
COLLECTOR MSRT 32OZ SLANT PNT_GRAD NONST WHT SPECI (PATU)
COLLECTOR MSRT SLANT GRAD C32OZ NONST WHT SPECI (PATU) IMPLANT
CONTAINR POLYPROP 32OZ PRNT GRAD FROST PNL UNBRK SPECI 3ANG (Cautery Accessories) ×2 IMPLANT
CONV USE ITEM 328276 - ELECTRODE TAPE PREATTACH LEADWIRE PED DDRGL EKG NONST LF  NTRD2 CLR DISP (Electrical Supplies) ×2 IMPLANT
CONV USE ITEM 337909 - PACK SURG MIN CYSTO STRL DISP LTX (CUSTOM TRAYS & PACK) ×2 IMPLANT
CYL GRAD POLYPROP 32OZ FROST P_NL TRANSLUC 3ANG (Cautery Accessories) ×1
DEVICE SECURE STATLK FOLEY ANCH PAD STAB TRCT SIL CATH ADULT STRL LF  DISP (UROLOGICAL SUPPLIES) IMPLANT
DEVICE SECURE STATLK FOLEY ANC_H PAD STAB TRCT SIL CATH ADLT (UROLOGICAL SUPPLIES)
DISC USE ITEM 163322 - SYRINGE LL 20ML LTX STRL MED (NEEDLES & SYRINGE SUPPLIES) IMPLANT
DISCONTINUED USE 338708 - BASIN MED GRAD 32OZ BLU STRL (MISCELLANEOUS PT CARE ITEMS)
DISCONTINUED USE 340506 - BASIN MED GRAD 32OZ BLU STRL (MISCELLANEOUS PT CARE ITEMS) IMPLANT
DISCONTINUED USE ITEM 328361 - JELLY LUB EZ BCTRST H2O SOL NG_RS FLPTP TUBE STRL 2OZ LF (MISCELLANEOUS PT CARE ITEMS) ×2 IMPLANT
ELECTRODE TAPE PREATTACH LEADW_IRE PED DDRGL EKG NONST LF (Electrical Supplies) ×1
JELLY LUB EZ BCTRST H2O SOL NG_RS FLPTP TUBE STRL 2OZ LF (MISCELLANEOUS PT CARE ITEMS) ×3
PACK CYSTO MINOR (CUSTOM TRAYS & PACK) ×1
PACK SURG MIN CYSTO STRL DISP LTX (CUSTOM TRAYS & PACK) ×2
SET 81IN REG CLAMP N-PYRG IRRG 10 GTT/ML STR CSCP DEHP BLADDER STRL LF (IV TUBING & ACCESSORIES) ×2 IMPLANT
SET 81IN REG CLAMP N-PYRG IRRG_10 GTT/ML STR CSCP DEHP (IV TUBING & ACCESSORIES) ×2
SET TUBING W/INFUSION LINE_TUB500 BX/25 (Drains/Resovoirs) ×1
SOL IV 0.9% NACL PVC 1000ML PH 5 PLASTIC CONTAINR PRSV FR VIAFLEX LF (SOLUTIONS) ×2 IMPLANT
SOLUTION IV NS 1000CC 2B1324X_14/CS (SOLUTIONS) ×1
SPONGE GAUZE STRL 4 X 4IN TUB_6939 1280/CS (WOUND CARE SUPPLY) ×2 IMPLANT
SPONGE GAUZE STRL 4 X 4IN TUB_6939 1280/CS (WOUND CARE/ENTEROSTOMAL SUPPLY) ×1
SYRINGE LL 20ML LTX STRL MED (NEEDLES & SYRINGE SUPPLIES)
TUBING URODY 400CM SIL PVC PUMP INFUS LINE STRL LF (Drains/Resovoirs) ×2 IMPLANT

## 2018-05-01 NOTE — Nurses Notes (Signed)
After Visit Summary (AVS) and education hand-outs were given to patient after instructional information was reviewed & understood.  Ambulated from Area B in stable condition accompanied by friend.

## 2018-05-01 NOTE — Discharge Summary (Signed)
[facility]  Department of Surgery  Discharge Day Note    [name]  Date of service: 05/01/2018  Date of Admission:  05/01/2018  Hospital Day:  LOS: 0 days     Examination at discharge:  Vital Signs: Temperature: 36.6 C (97.9 F) (05/01/18 1024)  Heart Rate: 62 (05/01/18 1024)  BP (Non-Invasive): (!) 147/76 (05/01/18 1024)  Respiratory Rate: 20 (05/01/18 1024)  SpO2: 98 % (05/01/18 1024)  Pain Score (Numeric, Faces): 1 (05/01/18 1024)  General: No distress    Assessment:   62 y.o. female s/p cystometrogram, uroflowmetry, voiding pressure studies, rigid cystourethroscopy    1. Mixed urinary incontinence  2. Recurrent Urinary Tract Infections  3. H/O Microhematuria    Disposition/Plan :   -Home discharge  -Start Ditropan 5mg  Oral BID PRN  -Follow up in 3 months for reevaluation    Alyce Pagan, MD  05/01/2018, 14:00    ATTENDING POST-OP NOTE    Patient did well post-procedure.   VS stable, patient feeling well without complaints.  Condition: Stable  Disposition: Discharge to home today.     Thurmon Fair, MD  05/01/2018, 14:16  Thurmon Fair, Brooke Bonito., MD, FACS  Associate Program Director, Urology  Associate Professor of Urology  [facility] Department of Urology

## 2018-05-01 NOTE — Discharge Instructions (Signed)
SURGICAL DISCHARGE INSTRUCTIONS     Dr. Thurmon Fair, MD  performed your CYSTOSCOPY WITH CYSTOMETROGRAM AND URODYNAMICS, PELVIC EXAM today at [facility]:  Monday through Friday from 6 a.m. - 7 p.m.: [phone]  Between 7 p.m. - 6 a.m., weekends and holidays:  Call [facility] at [phone] or [phone].    PLEASE SEE WRITTEN HANDOUTS AS DISCUSSED BY YOUR NURSE:      SIGNS AND SYMPTOMS OF A WOUND / INCISION INFECTION   Be sure to watch for the following:   Increase in redness or red streaks near or around the wound or incision.   Increase in pain that is intense or severe and cannot be relieved by the pain medication that your doctor has given you.   Increase in swelling that cannot be relieved by elevation of a body part, or by applying ice, if permitted.   Increase in drainage, or if yellow / green in color and smells bad. This could be on a dressing or a cast.   Increase in fever for longer than 24 hours, or an increase that is higher than 101 degrees Fahrenheit (normal body temperature is 98 degrees Fahrenheit). The incision may feel warm to the touch.    **CALL YOUR DOCTOR IF ONE OR MORE OF THESE SIGNS / SYMPTOMS SHOULD OCCUR.    ANESTHESIA INFORMATION   LOCAL ANESTHETIC:  You have receieved a local anesthetic, the effects should disappear in a few hours.    REMEMBER   If you experience any difficulty breathing, chest pain, bleeding that you feel is excessive, persistent nausea or vomiting or for any other concerns:  Call your physician Dr. Matthew Folks at [phone] or [phone]. You may also ask to have the Urology doctor on call paged. They are available to you 24 hours a day.    SPECIAL INSTRUCTIONS / COMMENTS       FOLLOW-UP APPOINTMENTS   Please call patient services at [phone] or [phone] to schedule a date / time of return. They are open Monday - Friday from 7:30 am - 5:00 pm.

## 2018-05-01 NOTE — H&P (Signed)
[facility]  Department of Urologic Surgery   Pre-operative History and Physical    [name]  [id number]  12/02/1955    CC:   History of:  1. Mixed urinary incontinence  2. Recurrent Urinary Tract Infections  3. H/O Microhematuria    HPI: [name] is a 62 y.o. female with a history as per above who presents for diagnostic cystoscopy, CMG, and pelvic bimanual exam. She reportedly has urgency, frequency, dysuria, and stress incontinence. No new issues. Denies any gross hematuria.    PMHx:   Past Medical History:   Diagnosis Date   . Arthritis    . Cancer (CMS HCC)     internal skin cancer   . Depression    . Essential hypertension 02/16/2017   . GERD (gastroesophageal reflux disease)     controlled w/med   . Heart murmur 1979    benign   . Hyperlipidemia LDL goal < 100 08/29/2013   . MINOR CAD (coronary artery disease) 11/10/2012    no treatment other than cholesterol medications; follows regularly with Dr. Rodney Booze   . Multinodular goiter    . Rheumatic fever 16    no complications   . Shortness of breath     on exertion   . Squamous cell carcinoma 02/06/2015    right side of neck   . Vaginal prolapse 2012    surgery improved significantly   . Wears glasses           PSHx:   Past Surgical History:   Procedure Laterality Date   . COLONOSCOPY  01/06/09    COLONOSCOPY performed by Loletta Parish F at [place]   . GASTROSCOPY  03/05/2010    GASTROSCOPY performed by Jocelyn Lamer at [place]   . GASTROSCOPY WITH BIOPSY  03/05/2010    GASTROSCOPY WITH BIOPSY performed by Jocelyn Lamer at [place]   . HX ADENOIDECTOMY     . HX ANKLE FRACTURE TX  2007    left distal fibula, casted   . HX APPENDECTOMY     . HX CHOLECYSTECTOMY     . HX COLONOSCOPY     . HX CYSTOCELE REPAIR  09/24/09   . HX HAND SURGERY  2012    for Carpal tunnel    . HX OOPHORECTOMY      left ovary removed   . HX TONSILLECTOMY     . HX TOTAL VAGINAL HYSTERECTOMY  1979   . HX WRIST FRACTURE [place]  2007    [place] injury   .  PB REVISE ULNAR NERVE AT ELBOW  1979   . PB UPPER GI ENDOSCOPY,BIOPSY  12/19/07    patulous GE junction zone, erythema, nonerosive GERD         Family Hx: Marland Kitchen  Family Medical History:     Problem Relation (Age of Onset)    Bipolar Disorder Daughter    Breast Cancer Paternal Aunt, Paternal Aunt, Paternal Aunt, Paternal Aunt, Paternal 35, Other    Cancer Paternal Aunt, Paternal 65, Paternal 84, Paternal 74, Paternal 68, Other (59)    Congestive Heart Failure Father (19)    Coronary Artery Disease Father, Mother (17)    Diabetes Mother    Heart Attack Father, Sister    High Cholesterol Mother    Hypertension (High Blood Pressure) Mother, Brother, Sister, Sister    Kidney Disease Sister    Leukemia Paternal Uncle, Paternal Uncle    Stroke Paternal Grandmother  Thyroid Disease Sister            Social Hx:   Social History     Socioeconomic History   . Marital status: Divorced     Spouse name: Not on file   . Number of children: Not on file   . Years of education: Not on file   . Highest education level: Not on file   Occupational History   . 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 Financial resource strain: Not on file   . Food insecurity:     Worry: Not on file     Inability: Not on file   . Transportation needs:     Medical: Not on file     Non-medical: Not on file   Tobacco Use   . Smoking status: Former Smoker     Packs/day: 2.00     Years: 40.00     Pack years: 80.00     Types: Cigarettes     Last attempt to quit: 01/22/2005     Years since quitting: 13.2   . Smokeless tobacco: Never Used   Substance and Sexual Activity   . Alcohol use: No     Alcohol/week: 0.0 oz   . Drug use: No   . Sexual activity: Not on file   Lifestyle   . Physical activity:     Days per week: Not on file     Minutes per session: Not on file   . Stress: Not on file   Relationships   . Social connections:     Talks on phone: Not on file     Gets together: Not on file      Attends religious service: Not on file     Active member of club or organization: Not on file     Attends meetings of clubs or organizations: Not on file     Relationship status: Not on file   . Intimate partner violence:     Fear of current or ex partner: Not on file     Emotionally abused: Not on file     Physically abused: Not on file     Forced sexual activity: Not on file   Other Topics Concern   . Abuse/Domestic Violence Not Asked   . Breast Self Exam Not Asked   . Caffeine Concern Not Asked   . Calcium intake adequate Not Asked   . Computer Use Not Asked   . Exercise Concern Not Asked   . Helmet Use Not Asked   . Seat Belt Not Asked   . Special Diet Not Asked   . Sunscreen used Not Asked   . Uses Cane No   . Uses walker No   . Uses wheelchair No   . Right hand dominant Yes   . Left hand dominant No   . Ambidextrous No   . Ability to Walk 1 Flight of Steps without SOB/CP Yes   . Routine Exercise Yes     Comment: walk   . Ability to Walk 2 Flight of Steps without SOB/CP Yes   . Unable to Ambulate Not Asked   . Total Care Not Asked   . Ability To Do Own ADL's Yes   . Uses Walker Not Asked   . Other Activity Level Not Asked   . Uses Cane Not Asked   . Drives Not Asked   .  Shift Work No   . Unusual Sleep-Wake Schedule No   Social History Narrative   . Not on file     Medications:   No current outpatient medications on file.     Allergies:   Allergies   Allergen Reactions   . Cymbalta [Duloxetine]      Rash on feet and hands   . Blue Dye      Told to avoid due to patch test result by Simone Curia   . Flavoring Agent    . Lipitor [Atorvastatin]      Muscle pain   . Nickel    . Nsaids (Non-Steroidal Anti-Inflammatory Drug)    . Codeine Itching     RASH   . Mobic [Meloxicam] Nausea/ Vomiting   . Naprosyn [Naproxen] Nausea/ Vomiting       ROS:  All 10 systems were reviewed.  There were pertinent positives in: Genital urinary system  There are no new changes to the prior reviewed review of systems.    Physical Exam:  BP  140/73   Pulse 69   Temp 36.1 C (97 F)   Resp 20   Ht 1.651 m (5\' 5" )   Wt 89.1 kg (196 lb 6.9 oz)   SpO2 97%   BMI 32.69 kg/m       General - Alert/oriented; NAD  Neck - Supple, trachea midline  Lungs - Respirations are unlabored, good inspiratory effort  Abdomen - Non-distended   Musculoskeletal - 4 extremities intact  Neurological - CN II-XII Grossly intact  GU system - Deferred. Plan to examine in OR    Assessment:  62 y.o. female with:     1. Mixed urinary incontinence  2. Recurrent Urinary Tract Infections  3. H/O Microhematuria    Plan:  To OR for diagnostic cystoscopy, CMG and bimanual pelvic examination    Consent obtained  The patient understands the risks of the procedure, which include bleeding, infection, recurrence, damage to the surrounding structures, failure to correct pathology, loss of sensation, and the risks of anesthesia (heart attack, stroke, death, etc.)    All questions of patient and family answered    Alyce Pagan, MD 05/01/2018, 08:42  [facility]  Department of Urology Resident       I saw and examined the patient.  I reviewed the resident's note.  I agree with the findings and plan of care as documented in the resident's note.  Any exceptions/additions are edited/noted.    Thurmon Fair, MD  Thurmon Fair, Brooke Bonito., MD, [place]  Associate Program Director, Urology  Associate Professor of Urology  [facility] Department of Urology

## 2018-05-01 NOTE — Discharge Summary (Deleted)
[facility]  Department of Surgery  Discharge Day Note    [name]  Date of service: 05/01/2018  Date of Admission:  08/06/2016  Hospital Day:  LOS: 0 days     Examination at discharge:  Vital Signs:    General: No distress    Assessment:   62 y.o. female s/p cystoscopy, cystometrogram, uroflowometry, and voiding studies which showed small capacity bladder with overactive bladder syndrome.     Disposition/Plan :   -Home discharge    -Start Ditropan 5mg  BID PRN  -Follow up in 3 months for re-evaluation      Alyce Pagan, MD  05/01/2018, 13:51

## 2018-05-01 NOTE — OR Surgeon (Signed)
[place] OF UROLOGY   OPERATION SUMMARY     PATIENT NAME: [name] NUMBER: [id number]   DATE OF SERVICE: 05/01/2018   DATE OF BIRTH: Aug 17, 1956     PREOPERATIVE DIAGNOSIS:   1. H/O Urinary Incontinence  2. H/O Recurrent UTI's- by hx  3. H/O micrhematuria    POSTOPERATIVE DIAGNOSIS:   1. Overactive Bladder (OAB)  2. Small capacity spastic bladder   3. Urge incontinence    NAME OF PROCEDURE:   1. Complex Cystometrogram    2. Complex Uroflowmetry    3. Voiding pressure studies    4. Rigid cystourethroscopy with bladder barbatoge    FINDINGS:   1. Small capacity spastic bladder/OAB  2. Evidence of involuntary detrusor contractions (spontaneous or provoked) during the filling phase  3. Evidence of involuntary urinary leaking during bladder filling or with provocation (i.e. valsalva and cough)  First leak noted at 29mL  4. Small capacity bladder (90 mL's)  5. Incomplete bladder emptying   6. No masses, lesions, or stones noted in bladder  7. Bilateral UOs in normal anatomic position  8. Invalid uroflowmetry as a result of less than 221mL to be voided      SURGEON(S): Thurmon Fair, MD   RESIDENT(S): Alyce Pagan, MD    ANESTHESIA: 2% lidocaine gel.   ESTIMATED BLOOD LOSS: None.   COMPLICATIONS: None.    INDICATIONS FOR PROCEDURE: [name] is a 62 y.o. female with a history of mixed urinary incontinence, recurrent Urinary Tract Infection, and H/O Microhematuria who presents for diagnostic cystoscopy and CMG. She has urgency, frequency, dysuria, and stress incontinence. No new issues. Denies any gross hematuria.    DESCRIPTION OF PROCEDURE: The patient was consented preoperatively and all risks and benefits of the procedure were explained including bleeding, infection, pain, bladder and urethral injury. She was then brought back to the cystoscopy suite and positioned for the urodynamic study. A 7-French urodynamics catheter was placed through the urethra and  secured to the thigh with a piece of tape. Patch EMG electrodes were placed on either side of the perineum and a small rectal tube was placed to monitor abdominal pressure. Medium fill cystometry was undertaken.     First sensation    20 mL's   First desire    32 mL's   Strong desire    45 mL's   Maximum capacity   73 mL's  Able to void   73 mL's total (47mL with catheter in place)  Peak velocity (Qmax)  7.2 mL/sec   Peak detrusor pressure  34.5 cm/H2O   PVR     23 mL's     Following urodynamics, she was laid in the lithotomy position and her genitalia were prepped and draped in the standard sterile fashion. 10 ml of 2% Lidocaine Uro-Jet were infused into the urethra. We passed a 17-French rigid cystoscope through the female urethra and into the bladder under direct visualization. Upon entering the bladder, panendoscopic views were obtained. Findings noted above. A bladder barbatoge was performed. The scope was then pulled back to examine the urethra.  The scope was then withdrawn, visualizing the entire urethra. There were no urethral abnormalities noted.     The patient tolerated this procedure well and was then taken back to the post-operative area in stable condition. Dr. Thurmon Fair was present and participated in the entire procedure.    DISPOSITION: The patient will be discharged home. She was given post-procedure antibiotics.  She was discharged with information regarding overactive bladder and will follow up in 3 months. She will be started on Ditropan 5mg  BID PRN.    Alyce Pagan, MD   [facility]  Division of Urology   05/01/2018, 10:39     I was present, participated and supervised the entire procedure.  Small capacity spastic bladder/OAB. Bladder capacity only 90 ml.  Begin Ditropan 5 mg bid prn. RTC in 3 months for reevaluation.    Thurmon Fair, MD  05/01/2018, 11:24  Raymond Gurney., MD, FACS  Associate Program Director, Urology  Associate Professor of Urology  [facility] Department of  Urology

## 2018-05-02 LAB — HISTORICAL URINARY CYTOLOGY

## 2018-05-04 ENCOUNTER — Ambulatory Visit: Payer: MEDICAID | Attending: LICENSED CLINICAL SOCIAL WORKER | Admitting: LICENSED CLINICAL SOCIAL WORKER

## 2018-05-04 DIAGNOSIS — F321 Major depressive disorder, single episode, moderate: Secondary | ICD-10-CM | POA: Insufficient documentation

## 2018-05-04 NOTE — Progress Notes (Signed)
FAMILY MEDICINE, [facility]  Operated by [facility]  [place] [address]  Dept: [phone]  Dept Fax: [phone]     [facility] MED [facility] MED    PATIENT NAME: [name]   CHART NUMBER: [id number]   DATE OF BIRTH: 06-21-1956   DATE OF SERVICE: 05/04/2018  REFERRING PHYSICIAN:  Dr. Stephanie Coup  PROCEDURE: Individual therapy [phone]  TIME IN/OUT:  2:44 Pm to 3:10PM       TOTAL TIME SPENT: 27 minutes  SESSION # 3    PRIMARY CARE PSYCHOLOGY PROGRESS NOTE    SUBJECTIVE: Brynlie Daza reported she was able to obtain HUD.  She innidiated her stress over her financial situation has been reduced.  Patient is very proud her has her own apartment.  She stated she feels safe in her apartment and expressed not having a safe place to live prior to her current location.  Patient reports her mood has been good.  She attends bingo with her friends.  She and her friends when on a shopping trip and out to eat.  She expressed she was angry with her friends as she feels "she walks over me".  Patient has been able to enjoy time with her daughter and her grandchildren.      OBJECTIVE:   The patient was a well-groomed, polite and cooperative white female. Rapport was easily established and maintained. Affect was broad and spontaneous and congruent with thought content. Speech and thought process were linear and coherent and thought content was devoid of psychotic features. Eye contact was appropriate. Mood was described as depressed. Concentration and memory are within normal limits. Patient displayed good judgment and insight. Suicidal ideation was denied. There was no evidence of mania, hallucinations, delusions, paranoia, obsessions, compulsions, or homicidal ideation.      DIAGNOSTIC IMPRESSION:   Moderate episode of recurrent major depressive disorder; hx of agoraphobia in remission.      PROCEDURES/PLAN:   Praised patient for her accomplishments.  Discussed with patient  communicating her feelings using "I" statements. Patient will practice communication techniques discussed in today's session.  Patient was agreeable to these recommendations and plans to return to clinic in 4weeks to assess symptom status and progress toward goals.At the next session, we will continue communication skills. Further treatment is warranted to obtain treatment goals. Prognosis is good.        Samuella Bruin, MSW, [place]  Operated by [facility]  [place] [address]  Dept: [phone]  Dept Fax: [phone]

## 2018-05-08 ENCOUNTER — Encounter (HOSPITAL_BASED_OUTPATIENT_CLINIC_OR_DEPARTMENT_OTHER): Payer: Self-pay | Admitting: Family Medicine

## 2018-05-10 ENCOUNTER — Ambulatory Visit (INDEPENDENT_AMBULATORY_CARE_PROVIDER_SITE_OTHER): Payer: MEDICAID | Admitting: Psychiatry

## 2018-05-10 DIAGNOSIS — F331 Major depressive disorder, recurrent, moderate: Secondary | ICD-10-CM

## 2018-05-10 DIAGNOSIS — F4310 Post-traumatic stress disorder, unspecified: Secondary | ICD-10-CM

## 2018-05-10 MED ORDER — FLUOXETINE 20 MG TABLET
20.00 mg | ORAL_TABLET | Freq: Two times a day (BID) | ORAL | 5 refills | Status: DC
Start: 2018-05-10 — End: 2018-07-18
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
## 2018-05-16 ENCOUNTER — Ambulatory Visit
Admission: RE | Admit: 2018-05-16 | Discharge: 2018-05-16 | Disposition: A | Discharge status: Home / Self Care | Payer: MEDICAID | Source: Ambulatory Visit | Attending: Family Medicine | Admitting: Family Medicine

## 2018-05-16 DIAGNOSIS — M79601 Pain in right arm: Secondary | ICD-10-CM

## 2018-05-22 ENCOUNTER — Ambulatory Visit (INDEPENDENT_AMBULATORY_CARE_PROVIDER_SITE_OTHER): Payer: Self-pay | Admitting: Neurology

## 2018-05-26 DIAGNOSIS — H66009 Acute suppurative otitis media without spontaneous rupture of ear drum, unspecified ear: Secondary | ICD-10-CM | POA: Insufficient documentation

## 2018-06-02 ENCOUNTER — Encounter (INDEPENDENT_AMBULATORY_CARE_PROVIDER_SITE_OTHER): Payer: Self-pay | Admitting: Neurology

## 2018-06-02 ENCOUNTER — Ambulatory Visit: Payer: MEDICAID | Attending: Neurology | Admitting: Neurology

## 2018-06-02 VITALS — BP 122/70 | HR 70 | Ht 65.0 in | Wt 197.3 lb

## 2018-06-02 DIAGNOSIS — Z87891 Personal history of nicotine dependence: Secondary | ICD-10-CM | POA: Insufficient documentation

## 2018-06-02 DIAGNOSIS — G9050 Complex regional pain syndrome I, unspecified: Secondary | ICD-10-CM | POA: Insufficient documentation

## 2018-06-02 DIAGNOSIS — G90511 Complex regional pain syndrome I of right upper limb: Secondary | ICD-10-CM

## 2018-06-02 MED ORDER — LIDOCAINE 5 % TOPICAL PATCH
1.0000 | MEDICATED_PATCH | Freq: Every day | CUTANEOUS | 0 refills | Status: AC
Start: 2018-06-02 — End: 2018-06-09

## 2018-06-05 ENCOUNTER — Ambulatory Visit (HOSPITAL_BASED_OUTPATIENT_CLINIC_OR_DEPARTMENT_OTHER): Payer: MEDICAID | Admitting: LICENSED CLINICAL SOCIAL WORKER

## 2018-06-05 ENCOUNTER — Encounter (HOSPITAL_BASED_OUTPATIENT_CLINIC_OR_DEPARTMENT_OTHER): Payer: Self-pay | Admitting: Family Medicine

## 2018-06-05 ENCOUNTER — Ambulatory Visit: Payer: MEDICAID | Attending: Family Medicine | Admitting: Family Medicine

## 2018-06-05 VITALS — BP 104/74 | HR 90 | Temp 97.7°F | Wt 198.4 lb

## 2018-06-05 DIAGNOSIS — K219 Gastro-esophageal reflux disease without esophagitis: Secondary | ICD-10-CM | POA: Insufficient documentation

## 2018-06-05 DIAGNOSIS — I10 Essential (primary) hypertension: Secondary | ICD-10-CM | POA: Insufficient documentation

## 2018-06-05 DIAGNOSIS — H66009 Acute suppurative otitis media without spontaneous rupture of ear drum, unspecified ear: Secondary | ICD-10-CM | POA: Insufficient documentation

## 2018-06-05 DIAGNOSIS — Z79899 Other long term (current) drug therapy: Secondary | ICD-10-CM | POA: Insufficient documentation

## 2018-06-05 MED ORDER — AMOXICILLIN 875 MG-POTASSIUM CLAVULANATE 125 MG TABLET
1.00 | ORAL_TABLET | Freq: Two times a day (BID) | ORAL | 0 refills | Status: AC
Start: 2018-06-05 — End: 2018-06-15

## 2018-06-05 MED ORDER — CIPROFLOXACIN 0.3 %-DEXAMETHASONE 0.1 % EAR DROPS,SUSPENSION
4.00 [drp] | Freq: Two times a day (BID) | OTIC | 0 refills | Status: AC
Start: 2018-06-05 — End: 2018-06-12

## 2018-06-05 NOTE — H&P (Addendum)
[facility]  Outpatient History and Physical    Date:  06/02/2018  Name: [name]  Age:  62 y.o.  Referring Physician:   Drema Balzarine, MD  [address]  [place], [place] [address]    History of Present Illness  History obtained from:  patient   [name] is a 62 year old right-handed Caucasian woman with a significant history of parotid gland tumor s/p surgery done and history of depression.  She comes with the complaint of pain in the right forearm pain associated with swelling.  These symptoms have been ongoing for over a year.      For the above complaints, she also had an EMG nerve conduction study done on May 16, 2018.  Her EMG nerve conduction study of right upper extremity was normal.  There was no evidence of median neuropathy at the right wrist or right ulnar neuropathy. Her symptoms get worse with activity of the right hand and get better with rest.  She denies any skin color change.  She denies any tingling or numbness in the hand or fingers.  She denies any muscle wasting.  She denies any history of neck trauma.  She described this pain as a continuous sharp pain around her elbow and occasionally in the right forearm.  She also complains of a burning sensation.      She has been on gabapentin and lidocaine patch as given by her primary care physician.  She denies bowel and bladder incontinence.  She denies any weakness in the lower extremities.  She denies any muscle spasms.  Occasionally, she complains of pain in the right part of the neck.  It occasionally radiates in the right hand.  She has never had MRI imaging of her cervical spine in the past.      In the past she was on Cymbalta, but she broke out with blisters she is allergic to this medication.     PAST MEDICAL HISTORY:  Depression.     PAST SURGICAL HISTORY:  Significant for left ulnar nerve repair surgery and surgery done for right parotid gland, hysterectomy, cholecystectomy, appendectomy.     SOCIAL  HISTORY:  She is single and has 3 children.  She is a nonsmoker, no IV drugs, no recreational drug use, and nonalcoholic.  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     FAMILY HISTORY:  She denies any family history of CMT.       [name] is a 62 y.o. female who presents with a chief complaint of   Chief Complaint   Patient presents with   . Swelling In Limb     RUE   . Neck Pain        The patient reports: complaint(s) of:  numbness or tingling.     Past Medical History  Current Outpatient Medications   Medication Sig   . acetaminophen (TYLENOL) 325 mg Oral Tablet Take 2 Tabs (650 mg total) by mouth Three times a day   . amoxicillin (AMOXIL) 500 mg Oral Capsule 500 mg Three times a day Take x10 days   . amoxicillin-pot clavulanate (AUGMENTIN) 875-125 mg Oral Tablet Take 1 Tab by mouth Twice daily for 10 days   . aspirin 81 mg Oral Tablet, Chewable Take 1 Tab (81 mg total) by mouth Once a day   . Azelaic Acid 20 % Cream Apply topically Twice daily   . cholecalciferol, vitamin D3, 1,000 unit Oral Tablet  Take 1 Tab (1,000 Units total) by mouth Once a day   . ciprofloxacin-dexamethasone (CIPRODEX) 0.3-0.1 % Otic Drops, Suspension Instill 4 Drops into both ears Twice daily for 7 days   . Clobetasol (TEMOVATE) 0.05 % Ointment Apply topically Twice daily To rash on hands and feet   . FLUoxetine (SARAFEM) 20 mg Oral Tablet Take 1 Tab (20 mg total) by mouth Twice daily   . gabapentin (NEURONTIN) 300 mg Oral Capsule Take 2 Caps (600 mg total) by mouth Once a day   . Halobetasol Propionate (ULTRAVATE) 0.05 % Cream Apply  topically. Three times a week   . lidocaine (LIDODERM) 5 % Adhesive Patch, Medicated 1 Patch (700 mg total) by Transdermal route Once a day for 7 days   . Metronidazole (METROCREAM) 0.75 % Cream Apply topically Twice daily   . nitroGLYCERIN (NITROSTAT) 0.4 mg Sublingual Tablet, Sublingual 1 Tab (0.4 mg total) by Sublingual route Every 5 minutes as needed for Chest pain for up to 3  doses for 3 doses over 15 minutes   . nystatin (NYSTOP) 100,000 unit/gram Powder Apply once or twice daily to PREVENT rash under breasts   . pantoprazole (PROTONIX) 40 mg Oral Tablet, Delayed Release (E.C.) Take 1 Tab (40 mg total) by mouth Every morning before breakfast   . triamcinolone acetonide (ARISTOCORT A) 0.1 % Ointment by Apply Topically route Twice daily     Allergies   Allergen Reactions   . Cymbalta [Duloxetine]      Rash on feet and hands   . Blue Dye      Told to avoid due to patch test result by Simone Curia   . Flavoring Agent    . Lipitor [Atorvastatin]      Muscle pain   . Nickel    . Nsaids (Non-Steroidal Anti-Inflammatory Drug)    . Codeine Itching     RASH   . Mobic [Meloxicam] Nausea/ Vomiting   . Naprosyn [Naproxen] Nausea/ Vomiting     Past Medical History:   Diagnosis Date   . Arthritis    . Cancer (CMS HCC)     internal skin cancer   . Depression    . Essential hypertension 02/16/2017   . GERD (gastroesophageal reflux disease)     controlled w/med   . Heart murmur 1979    benign   . Hyperlipidemia LDL goal < 100 08/29/2013   . MINOR CAD (coronary artery disease) 11/10/2012    no treatment other than cholesterol medications; follows regularly with Dr. Rodney Booze   . Multinodular goiter    . Rheumatic fever 16    no complications   . Shortness of breath     on exertion   . Squamous cell carcinoma 02/06/2015    right side of neck   . Vaginal prolapse 2012    surgery improved significantly   . Wears glasses          Past Surgical History:   Procedure Laterality Date   . COLONOSCOPY  01/06/09    COLONOSCOPY performed by Loletta Parish F at [facility]   . GASTROSCOPY  03/05/2010    GASTROSCOPY performed by Jocelyn Lamer at [facility]   . GASTROSCOPY WITH BIOPSY  03/05/2010    GASTROSCOPY WITH BIOPSY performed by Jocelyn Lamer at [facility]   . HX ADENOIDECTOMY     . HX ANKLE FRACTURE [place]  2007    left distal fibula, casted   . HX APPENDECTOMY     .  HX CHOLECYSTECTOMY     . HX COLONOSCOPY     . HX CYSTOCELE  REPAIR  09/24/09   . HX HAND SURGERY  2012    for Carpal tunnel    . HX OOPHORECTOMY      left ovary removed   . HX TONSILLECTOMY     . HX TOTAL VAGINAL HYSTERECTOMY  1979   . HX WRIST FRACTURE [place]  2007    [place] injury   . PB REVISE ULNAR NERVE AT ELBOW  1979   . PB UPPER GI ENDOSCOPY,BIOPSY  12/19/07    patulous GE junction zone, erythema, nonerosive GERD         Family Medical History:     Problem Relation (Age of Onset)    Bipolar Disorder Daughter    Breast Cancer Paternal Aunt, Paternal Aunt, Paternal Aunt, Paternal Aunt, Paternal 52, Other    Cancer Paternal Aunt, Paternal 86, Paternal 52, Paternal 25, Paternal 29, Other (68)    Congestive Heart Failure Father (74)    Coronary Artery Disease Father, Mother (42)    Diabetes Mother    Heart Attack Father, Sister    High Cholesterol Mother    Hypertension (High Blood Pressure) Mother, Brother, Sister, Sister    Kidney Disease Sister    Leukemia Paternal Uncle, Paternal Uncle    Stroke Paternal Grandmother    Thyroid Disease Sister            Social History     Socioeconomic History   . Marital status: Divorced     Spouse name: Not on file   . Number of children: Not on file   . Years of education: Not on file   . Highest education level: Not on file   Occupational History   . 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 Smoking status: Former Smoker     Packs/day: 2.00     Years: 40.00     Pack years: 80.00     Types: Cigarettes     Last attempt to quit: 01/22/2005     Years since quitting: 13.3   . Smokeless tobacco: Never Used   Substance and Sexual Activity   . Alcohol use: No     Alcohol/week: 0.0 standard drinks   . Drug use: No   Other Topics Concern   . Uses Cane No   . Uses walker No   . Uses wheelchair No   . Right hand dominant Yes   . Left hand dominant No   . Ambidextrous No   . Ability to Walk 1 Flight of Steps without SOB/CP Yes   . Routine Exercise Yes     Comment: walk   . Ability  to Walk 2 Flight of Steps without SOB/CP Yes   . Ability To Do Own ADL's Yes   . Shift Work No   . Unusual Sleep-Wake Schedule No       Review of Systems  Other than ROS in the HPI, all other systems were negative.    Examination:    Vitals: BP 122/70   Pulse 70   Ht 1.651 m (5\' 5" )   Wt 89.5 kg (197 lb 5 oz)   SpO2 95%   BMI 32.83 kg/m          GENERAL: Appears in good health.  HEENT:  Atraumatic, no pallor, no icterus.  NECK:  No nuchal rigidity.  MUSCULOSKELETAL:  Normal.  SKIN:  No rashes.  CARDIOVASCULAR:  S1 and S2 heard.  No murmurs noted.  RESPIRATORY:  Bilateral bronchovascular breath sounds appreciated.  No crackles or wheezes.  NEUROLOGIC:  Comprehensive neurologic exam was performed.  She is alert and oriented to time, place, and person.  Good recall.  Good registration.  Normal episodic, semantic, and procedural memory.  No hallucinations or delusions.  Mood was congruent.  CRANIAL NERVES Ophthalmoscopic: fundus exam showed no disc swelling . Visual acuity 20/30 without correction.  Visual fields were normal. Pupils are reactive to light. There is no INO no APD.Extraocular movements, normal full versions and ductions with normal pursuits and saccades. Normal facial sensation and strength in V1, V2, and V3 area.  No dysarthria.  Muscles of mastication normal.  Tongue movements were normal.  Normal hearing to finger rub. Cranial nerve 9 and 10 is intact gag reflex intact.Palate elevates in midline. Uvula in center.   MOTOR:  Gait was normal with normal arm swing and stride. Rapid alternating movement was intact. Strength 5/5 in both upper and lower extremity. Wrist extension on the right, she has a component of give-away weakness due to pain.  Again, on right shoulder abduction, she complained of give-away weakness due to pain, otherwise motor and sensory 5/5 throughout.  There is no muscle wasting noted.  Tone and bulk appear normal.  Abductor pollicis brevis muscle strength is 4/5 on the right  side as compared to the left.  Palmar and dorsal interossei muscle strength is 5/5 bilaterally.    Tone and bulk appear normal. Muscle Tone: Normal  COORDINATION:intact for finger to nose test, heel to shin test, tandem walk intact.  Muscle exam  Arm Right Left Leg Right Left   Deltoid 5/5 5/5 Iliopsoas 5/5 5/5   Biceps 5/5 5/5 Quads 5/5 5/5   Triceps 5/5 5/5 Hamstrings 5/5 5/5   Wrist Extension 5/5 5/5 Ankle Dorsi Flexion 5/5 5/5   Wrist Flexion 5/5 5/5 Ankle Plantar Flexion 5/5 5/5   Interossei 5/5 5/5 Ankle Eversion 5/5 5/5   APB 5/5 5/5 Ankle Inversion 5/5 5/5         Reflexes    RJ BJ TJ KJ AJ Plantars Hoffman's   Right 2+ 2+ 2+ 3+ 2+ downgoing Not present   Left 2+ 2+ 2+ 3+ 2+ downgoing Not present    Sensory examination was intact for light touch, pinprick. vibration sensation and position sense  was intact.      I have reviewed the following: outside records    Assessment and Plan    1. Complex regional pain syndrome i of right upper limb      Orders Placed This Encounter   . MRI SPINE CERVICAL W/WO CONTRAST   . lidocaine (LIDODERM) 5 % Adhesive Patch, Medicated   Ahmani Daoud is a 62 year old right-handed Caucasian woman with a significant history of parotid gland tumor s/p surgery done and history of depression.  She comes with the complaint of pain in the right forearm pain associated with swelling.  These symptoms have been ongoing for over a year.      For the above complaints, she also had an EMG nerve conduction study done on May 16, 2018.  Her EMG nerve conduction study of right upper extremity was normal.  There was no evidence of median neuropathy at the right wrist or right ulnar neuropathy. Her symptoms get worse with activity of the right hand and get better with rest.  She  denies any skin color change.  She denies any tingling or numbness in the hand or fingers.  She denies any muscle wasting.  She denies any history of neck trauma.  She described this pain as a continuous sharp pain  around her elbow and occasionally in the right forearm.  She also complains of a burning sensation.  Differential includes tenosynovitis of the right elbow area versus chronic regional pain disorder.  We recommend that patient continue on gabapentin at 600 mg 2 times a day.  We also encouraged the patient to apply lidocaine patch.  She occasionally complains of pain radiating from her neck to the right upper extremity.  We will get an MRI of the cervical spine to look for any disk bulge or any cord signal change.    She will follow with her primary care physician.  We are going to see her in 3-4 months and as needed.Will get MRI Cervical spine to look for disc disease,continue on gabapentin and lidocaine patch.  Catalina Gravel, MD 06/02/2018, 2200

## 2018-06-05 NOTE — Progress Notes (Signed)
Subjective:     Patient ID:  [name] is an 62 y.o. female   Chief Complaint:    Chief Complaint   Patient presents with   . Ear Infection     [facility], [facility] today         Hypertension   This is a chronic problem. The problem is controlled. Pertinent negatives include no chest pain, palpitations or shortness of breath. The current treatment provides significant improvement. There are no compliance problems.    Ear Drainage   This is a new problem. Pertinent negatives include no chest pain and no shortness of breath. Associated symptoms comments: Ear pain, postnasal drip, sinus pressure. The symptoms are relieved by medications. Treatments tried: amoxicillin. The treatment provided no relief.     GERD - active and stable on medications. No complaints.    Review of Systems   HENT: Positive for ear discharge, ear pain, postnasal drip and sinus pressure.    Respiratory: Negative for cough, shortness of breath and wheezing.    Cardiovascular: Negative for chest pain, palpitations and leg swelling.   Gastrointestinal: Negative for blood in stool, constipation, diarrhea, nausea and vomiting.   Psychiatric/Behavioral: Negative.      Past Medical History:   Diagnosis Date   . Arthritis    . Cancer (CMS HCC)     internal skin cancer   . Depression    . Essential hypertension 02/16/2017   . GERD (gastroesophageal reflux disease)     controlled w/med   . Heart murmur 1979    benign   . Hyperlipidemia LDL goal < 100 08/29/2013   . MINOR CAD (coronary artery disease) 11/10/2012    no treatment other than cholesterol medications; follows regularly with Dr. Rodney Booze   . Multinodular goiter    . Rheumatic fever 16    no complications   . Shortness of breath     on exertion   . Squamous cell carcinoma 02/06/2015    right side of neck   . Vaginal prolapse 2012    surgery improved significantly   . Wears glasses      Current Outpatient Medications   Medication Sig   . acetaminophen (TYLENOL) 325 mg Oral  Tablet Take 2 Tabs (650 mg total) by mouth Three times a day   . amoxicillin (AMOXIL) 500 mg Oral Capsule 500 mg Three times a day Take x10 days   . aspirin 81 mg Oral Tablet, Chewable Take 1 Tab (81 mg total) by mouth Once a day   . Azelaic Acid 20 % Cream Apply topically Twice daily   . cholecalciferol, vitamin D3, 1,000 unit Oral Tablet Take 1 Tab (1,000 Units total) by mouth Once a day   . Clobetasol (TEMOVATE) 0.05 % Ointment Apply topically Twice daily To rash on hands and feet   . FLUoxetine (SARAFEM) 20 mg Oral Tablet Take 1 Tab (20 mg total) by mouth Twice daily   . gabapentin (NEURONTIN) 300 mg Oral Capsule Take 2 Caps (600 mg total) by mouth Once a day   . Halobetasol Propionate (ULTRAVATE) 0.05 % Cream Apply  topically. Three times a week   . lidocaine (LIDODERM) 5 % Adhesive Patch, Medicated 1 Patch (700 mg total) by Transdermal route Once a day for 7 days   . Metronidazole (METROCREAM) 0.75 % Cream Apply topically Twice daily   . nitroGLYCERIN (NITROSTAT) 0.4 mg Sublingual Tablet, Sublingual 1 Tab (0.4 mg total) by Sublingual route Every 5 minutes as  needed for Chest pain for up to 3 doses for 3 doses over 15 minutes   . nystatin (NYSTOP) 100,000 unit/gram Powder Apply once or twice daily to PREVENT rash under breasts   . pantoprazole (PROTONIX) 40 mg Oral Tablet, Delayed Release (E.C.) Take 1 Tab (40 mg total) by mouth Every morning before breakfast   . triamcinolone acetonide (ARISTOCORT A) 0.1 % Ointment by Apply Topically route Twice daily     Social History     Socioeconomic History   . Marital status: Divorced     Spouse name: Not on file   . Number of children: Not on file   . Years of education: Not on file   . Highest education level: Not on file   Occupational History   . 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 Smoking status: Former Smoker     Packs/day: 2.00     Years: 40.00     Pack years: 80.00     Types: Cigarettes      Last attempt to quit: 01/22/2005     Years since quitting: 13.3   . Smokeless tobacco: Never Used   Substance and Sexual Activity   . Alcohol use: No     Alcohol/week: 0.0 standard drinks   . Drug use: No   Other Topics Concern   . Uses Cane No   . Uses walker No   . Uses wheelchair No   . Right hand dominant Yes   . Left hand dominant No   . Ambidextrous No   . Ability to Walk 1 Flight of Steps without SOB/CP Yes   . Routine Exercise Yes     Comment: walk   . Ability to Walk 2 Flight of Steps without SOB/CP Yes   . Ability To Do Own ADL's Yes   . Shift Work No   . Unusual Sleep-Wake Schedule No     Family Medical History:     Problem Relation (Age of Onset)    Bipolar Disorder Daughter    Breast Cancer Paternal Aunt, Paternal Aunt, Paternal Aunt, Paternal Aunt, Paternal 38, Other    Cancer Paternal Aunt, Paternal 68, Paternal 25, Paternal 29, Paternal 58, Other (72)    Congestive Heart Failure Father (8)    Coronary Artery Disease Father, Mother (67)    Diabetes Mother    Heart Attack Father, Sister    High Cholesterol Mother    Hypertension (High Blood Pressure) Mother, Brother, Sister, Sister    Kidney Disease Sister    Leukemia Paternal Uncle, Paternal Uncle    Stroke Paternal Grandmother    Thyroid Disease Sister            Objective:   Physical Exam   Constitutional: She appears well-developed. No distress.   HENT:   Head: Normocephalic and atraumatic.   Right Ear: There is drainage and tenderness. Tympanic membrane is injected, erythematous and bulging.   Left Ear: Tympanic membrane and ear canal normal.   Nose: Nose normal.   Eyes: Pupils are equal, round, and reactive to light. EOM are normal. Right eye exhibits no discharge. Left eye exhibits no discharge.   Neck: Normal range of motion. Neck supple.   Cardiovascular: Normal rate, regular rhythm and normal heart sounds.   Pulmonary/Chest: Effort normal and breath sounds normal. No respiratory distress.   Lymphadenopathy:     She has cervical  adenopathy.   Skin:  She is not diaphoretic.     Ortho Exam    Vitals:    06/05/18 1329   BP: 104/74   Pulse: 90   Temp: 36.5 C (97.7 F)   TempSrc: Thermal Scan   SpO2: 99%   Weight: 90 kg (198 lb 6.6 oz)       Assessment & Plan:       ICD-10-CM    1. Essential hypertension I10    2. Acute suppurative otitis media without spontaneous rupture of ear drum H66.009    3. Gastroesophageal reflux disease, esophagitis presence not specified K21.9      1.HTN   - well controlled, chronic   - continue meds    2. GERD    - stable, chronic   - continue meds    3. Ear infection   - oral abx and topical as outer ear is ttp and pinna movement is very painful.           Theophilus Kinds, MD  06/05/2018, 13:53

## 2018-06-06 ENCOUNTER — Ambulatory Visit (INDEPENDENT_AMBULATORY_CARE_PROVIDER_SITE_OTHER): Payer: Self-pay | Admitting: Neurology

## 2018-06-06 NOTE — Telephone Encounter (Signed)
-----   Message from Judd Lien sent at 06/06/2018  1:20 PM EDT -----  ----- Message from Lajuana Ripple sent at 06/06/2018  1:12 PM EDT -----  Pt's pharmacy called in regards to lidocaine (LIDODERM) 5 % Adhesive Patch, Medicated. She stated that the insurance will not pay for it because it is coming back that Dr. Cipriano Mile is not enrolled in [facility]. Thanks

## 2018-06-06 NOTE — Nursing Note (Signed)
LMOM- advised can change RX to [employer], RN  06/06/2018, 16:46

## 2018-06-21 ENCOUNTER — Encounter (HOSPITAL_BASED_OUTPATIENT_CLINIC_OR_DEPARTMENT_OTHER): Payer: Self-pay | Admitting: Family Medicine

## 2018-06-27 ENCOUNTER — Encounter (HOSPITAL_BASED_OUTPATIENT_CLINIC_OR_DEPARTMENT_OTHER): Payer: Self-pay | Admitting: Physician Assistant

## 2018-06-27 ENCOUNTER — Ambulatory Visit: Payer: MEDICAID | Attending: Physician Assistant | Admitting: Physician Assistant

## 2018-06-27 VITALS — BP 106/68 | HR 88 | Temp 98.2°F | Resp 16 | Ht 65.0 in | Wt 202.6 lb

## 2018-06-27 DIAGNOSIS — H9201 Otalgia, right ear: Principal | ICD-10-CM | POA: Insufficient documentation

## 2018-06-27 DIAGNOSIS — Z87891 Personal history of nicotine dependence: Secondary | ICD-10-CM | POA: Insufficient documentation

## 2018-06-27 NOTE — Progress Notes (Signed)
Subjective:     Patient ID:  [name] is an 62 y.o. female   Chief Complaint:    Chief Complaint   Patient presents with   . Ear Infection       HPI    The patient is complaining of right ear pain. At times the pressure radiates to the right side of her face. She feels her right eye and right side her neck are swollen. She denies drainage from the right ear, fever, congestion, or sinus pressure.Her symptoms began 1 to 2 months ago. She states she was seen at [facility] and our clinic for the same symptoms. She was given Amoxicillin and and Ciprodex drops without any relief. She has tried Flonase and hot compresses with some relief. Tylenol does not give her any relief. 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       Current Outpatient Medications   Medication Sig   . acetaminophen (TYLENOL) 325 mg Oral Tablet Take 2 Tabs (650 mg total) by mouth Three times a day   . amoxicillin (AMOXIL) 500 mg Oral Capsule 500 mg Three times a day Take x10 days   . aspirin 81 mg Oral Tablet, Chewable Take 1 Tab (81 mg total) by mouth Once a day   . Azelaic Acid 20 % Cream Apply topically Twice daily   . cholecalciferol, vitamin D3, 1,000 unit Oral Tablet Take 1 Tab (1,000 Units total) by mouth Once a day   . Clobetasol (TEMOVATE) 0.05 % Ointment Apply topically Twice daily To rash on hands and feet   . FLUoxetine (SARAFEM) 20 mg Oral Tablet Take 1 Tab (20 mg total) by mouth Twice daily   . gabapentin (NEURONTIN) 300 mg Oral Capsule Take 2 Caps (600 mg total) by mouth Once a day   . Halobetasol Propionate (ULTRAVATE) 0.05 % Cream Apply  topically. Three times a week   . Metronidazole (METROCREAM) 0.75 % Cream Apply topically Twice daily   . nitroGLYCERIN (NITROSTAT) 0.4 mg Sublingual Tablet, Sublingual 1 Tab (0.4 mg total) by Sublingual route Every 5 minutes as needed for Chest pain for up to 3 doses for 3 doses over 15 minutes   . nystatin (NYSTOP) 100,000  unit/gram Powder Apply once or twice daily to PREVENT rash under breasts   . pantoprazole (PROTONIX) 40 mg Oral Tablet, Delayed Release (E.C.) Take 1 Tab (40 mg total) by mouth Every morning before breakfast   . triamcinolone acetonide (ARISTOCORT A) 0.1 % Ointment by Apply Topically route Twice daily     Allergies   Allergen Reactions   . Cymbalta [Duloxetine]      Rash on feet and hands   . Blue Dye      Told to avoid due to patch test result by Simone Curia   . Flavoring Agent    . Lipitor [Atorvastatin] Myalgia     Muscle pain   . Nickel    . Nsaids (Non-Steroidal Anti-Inflammatory Drug)    . Codeine Itching     RASH   . Mobic [Meloxicam] Nausea/ Vomiting   . Naprosyn [Naproxen] Nausea/ Vomiting     Past Medical History:   Diagnosis Date   . Arthritis    . Cancer (CMS HCC)     internal skin cancer   . Depression    . Essential hypertension 02/16/2017   . GERD (gastroesophageal reflux disease)     controlled w/med   .  Heart murmur 1979    benign   . Hyperlipidemia LDL goal < 100 08/29/2013   . MINOR CAD (coronary artery disease) 11/10/2012    no treatment other than cholesterol medications; follows regularly with Dr. Rodney Booze   . Multinodular goiter    . Rheumatic fever 16    no complications   . Shortness of breath     on exertion   . Squamous cell carcinoma 02/06/2015    right side of neck   . Vaginal prolapse 2012    surgery improved significantly   . Wears glasses      Social History     Tobacco Use   . Smoking status: Former Smoker     Packs/day: 2.00     Years: 40.00     Pack years: 80.00     Types: Cigarettes     Last attempt to quit: 01/22/2005     Years since quitting: 13.4   . Smokeless tobacco: Never Used   Substance Use Topics   . Alcohol use: No     Alcohol/week: 0.0 standard drinks   . Drug use: No     Family Medical History:     Problem Relation (Age of Onset)    Bipolar Disorder Daughter    Breast Cancer Paternal Aunt, Paternal Aunt, Paternal Aunt, Paternal 58, Paternal 75, Other    Cancer Paternal  Aunt, Paternal 42, Paternal 41, Paternal 36, Paternal 50, Other (55)    Congestive Heart Failure Father (14)    Coronary Artery Disease Father, Mother (29)    Diabetes Mother    Heart Attack Father, Sister    High Cholesterol Mother    Hypertension (High Blood Pressure) Mother, Brother, Sister, Sister    Kidney Disease Sister    Leukemia Paternal Uncle, Paternal Uncle    Stroke Paternal Grandmother    Thyroid Disease Sister        Review of Systems   Constitutional: Negative.    HENT: Positive for ear pain.         Right side of neck swollen.    Eyes:        Swollen right eye.    Respiratory: Negative.    Cardiovascular: Negative.    Gastrointestinal: Negative.    Endocrine: Negative.    Genitourinary: Negative.    Musculoskeletal: Negative.    Skin: Negative.    Neurological: Negative.    Psychiatric/Behavioral: Negative.      Objective:   Physical Exam   Constitutional: She is oriented to person, place, and time. She appears well-developed and well-nourished.   HENT:   Head: Normocephalic and atraumatic.   Right Ear: Tympanic membrane and ear canal normal. There is swelling and tenderness.   Left Ear: Tympanic membrane, external ear and ear canal normal.   Nose: Nose normal.   Mouth/Throat: Uvula is midline, oropharynx is clear and moist and mucous membranes are normal.   Mild amount of clear drainage present in the posterior oropharynx. Right outer ear is slightly swollen, but non-erythematous.    Eyes: Pupils are equal, round, and reactive to light.   Neck: Normal range of motion.   Cardiovascular: Normal rate, regular rhythm and normal heart sounds.   Pulmonary/Chest: Effort normal and breath sounds normal.   Lymphadenopathy:     She has no cervical adenopathy.   Neurological: She is alert and oriented to person, place, and time.   Skin: Skin is warm and dry.   Vitals reviewed.    Vitals:  06/27/18 1402   BP: 106/68   Pulse: 88   Resp: 16   Temp: 36.8 C (98.2 F)   TempSrc: Thermal Scan   SpO2: 97%      Weight: 91.9 kg (202 lb 9.6 oz)   Height: 1.651 m (5\' 5" )   BMI: 33.79       Ortho Exam    Assessment & Plan:     1. (H92.01) Right ear pain  (primary encounter diagnosis)  Plan: Will place referral to ENT for further evaluation.  May use Zyrtec and Flonase daily.  May alternate Ibuprofen and Tylenol PRN for pain..  May apply warm compresses PRN.  To call office if symptoms persist or worsen.    Patient seen independently with cosigning physician present in clinic.    Lorn Junes, PA-C    [place], [place]  06/27/2018, 14:46    ___________________________________________________________________  The Physician Assistant has seen the above patient independently while I was present in clinic.  I have reviewed their note, and have documented any exceptions or additions.    Martha Clan, DO 06/27/2018, 14:54  Assistant Professor  [facility]  [facility] [place]

## 2018-06-28 ENCOUNTER — Other Ambulatory Visit (HOSPITAL_BASED_OUTPATIENT_CLINIC_OR_DEPARTMENT_OTHER): Payer: Self-pay | Admitting: Family Medicine

## 2018-06-28 DIAGNOSIS — M542 Cervicalgia: Secondary | ICD-10-CM

## 2018-06-28 NOTE — Telephone Encounter (Signed)
-----   Message from Nolen Mu, RN sent at 06/28/2018  9:26 AM EDT -----  Regarding: FW: medication needed   ----- Message from [place] sent at 06/28/2018  9:09 AM EDT -----        Pt is calling in and never got her gabapentin (NEURONTIN) 600 mg Oral Capsule 2 times daily. It was not received by pharmacy. Please send.    Thanks [facility]    Preferred Pharmacy     [id number] - [address] [place] [address]    Phone: [phone] Fax: [phone]    Not a 24 hour pharmacy; exact hours not known.

## 2018-06-28 NOTE — Telephone Encounter (Signed)
Orders pended. Chanika Byland E Jesaiah Fabiano, RN

## 2018-06-29 NOTE — Telephone Encounter (Signed)
Regarding: Strick pt//Rx refill  ----- Message from Bennett Scrape sent at 06/29/2018 11:45 AM EDT -----  Dennison Mascot, MD    Pt's pharmacy called requesting a refill of the following Rx, sent to the below pharmacy.  Phone number has been verified.    gabapentin (NEURONTIN) 300 mg Oral Capsule 60 Cap 1 03/09/2018    Sig - Route: Take 2 Caps (600 mg total) by mouth Once a day - Oral   Class: Print     Preferred Pharmacy     [id number] - [address] [place] - [address]    [address] [place] [address]    Phone: [phone] Fax: [phone]    Not a 24 hour pharmacy; exact hours not known.      Thank you,  Bennett Scrape

## 2018-06-30 ENCOUNTER — Ambulatory Visit (INDEPENDENT_AMBULATORY_CARE_PROVIDER_SITE_OTHER): Payer: MEDICAID | Admitting: Family

## 2018-06-30 ENCOUNTER — Encounter (INDEPENDENT_AMBULATORY_CARE_PROVIDER_SITE_OTHER): Payer: Self-pay | Admitting: Family

## 2018-06-30 VITALS — BP 112/70 | HR 73 | Temp 97.1°F | Ht 65.0 in | Wt 201.3 lb

## 2018-06-30 DIAGNOSIS — J309 Allergic rhinitis, unspecified: Secondary | ICD-10-CM

## 2018-06-30 DIAGNOSIS — M26629 Arthralgia of temporomandibular joint, unspecified side: Secondary | ICD-10-CM

## 2018-06-30 DIAGNOSIS — H9201 Otalgia, right ear: Secondary | ICD-10-CM

## 2018-06-30 MED ORDER — GABAPENTIN 300 MG CAPSULE
600.0000 mg | ORAL_CAPSULE | Freq: Every day | ORAL | 0 refills | Status: DC
Start: 2018-06-30 — End: 2018-07-20

## 2018-06-30 NOTE — H&P (Signed)
[facility]  CLINIC H&P    Name: [name], 62 y.o. female  MRN: [id number]  Date of Birth: Apr 14, 1956  Date of Service: 06/30/2018    Chief Complaint:    Chief Complaint   Patient presents with   . Ear Infection     History of Present Illness: [name] is a 62 y.o. female who presents today with right sided otalgia and pressure for 1-2 months. She has been treated for 2 ear infections with Amoxicillin, Augmentin, and Ciprodex drops without any symptom relief. The pain and pressure will radiate up the side of her jaw and cause blurred vision and eye pain. The pain is a sharp, intermittent, throbbing pain that is worse in the evenings or if cold air hits her face. Heat application does seem to improve the pain. She denies any change in hearing, otorrhea, tinnitus, aural fullness, recurrent ear infections, vertigo, or facial weakness. 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      A secondary concern she has are allergies. Over the past month she has had increased sneezing, itchy/watery eyes, and clear nasal drainage. She does not currently manage her allergies with any medication. No other concerns at this time.       Past Medical History:  Past Medical History:   Diagnosis Date   . Arthritis    . Cancer (CMS HCC)     internal skin cancer   . Depression    . Essential hypertension 02/16/2017   . GERD (gastroesophageal reflux disease)     controlled w/med   . Heart murmur 1979    benign   . Hyperlipidemia LDL goal < 100 08/29/2013   . MINOR CAD (coronary artery disease) 11/10/2012    no treatment other than cholesterol medications; follows regularly with Dr. Rodney Booze   . Multinodular goiter    . Rheumatic fever 16    no complications   . Shortness of breath     on exertion   . Squamous cell carcinoma 02/06/2015    right side of neck   . Vaginal prolapse 2012    surgery improved significantly   . Wears glasses              Past Surgical History:  Past Surgical History:   Procedure Laterality Date   . Colonoscopy  01/06/09   . Gastroscopy  03/05/2010   . Gastroscopy with biopsy  03/05/2010   . Hx adenoidectomy     . Hx ankle fracture tx  2007   . Hx appendectomy     . Hx cholecystectomy     . Hx colonoscopy     . Hx cystocele repair  09/24/09   . Hx hand surgery  2012   . Hx oophorectomy     . Hx tonsillectomy     . Hx total vaginal hysterectomy  1979   . Hx wrist fracture tx  2007   . Pb revise ulnar nerve at elbow  1979   . Pb upper gi endoscopy,biopsy  12/19/07     Medications:  Outpatient Medications Marked as Taking for the 06/30/18 encounter (Office Visit) with Junious Dresser, APRN,NP-C   Medication Sig   . acetaminophen (TYLENOL) 325 mg Oral Tablet Take 2 Tabs (650 mg total) by mouth Three times a day   . amoxicillin (AMOXIL) 500 mg Oral Capsule 500 mg Three times a day Take x10 days   .  aspirin 81 mg Oral Tablet, Chewable Take 1 Tab (81 mg total) by mouth Once a day   . Azelaic Acid 20 % Cream Apply topically Twice daily   . cholecalciferol, vitamin D3, 1,000 unit Oral Tablet Take 1 Tab (1,000 Units total) by mouth Once a day   . Clobetasol (TEMOVATE) 0.05 % Ointment Apply topically Twice daily To rash on hands and feet   . Halobetasol Propionate (ULTRAVATE) 0.05 % Cream Apply  topically. Three times a week   . Metronidazole (METROCREAM) 0.75 % Cream Apply topically Twice daily   . nitroGLYCERIN (NITROSTAT) 0.4 mg Sublingual Tablet, Sublingual 1 Tab (0.4 mg total) by Sublingual route Every 5 minutes as needed for Chest pain for up to 3 doses for 3 doses over 15 minutes   . nystatin (NYSTOP) 100,000 unit/gram Powder Apply once or twice daily to PREVENT rash under breasts   . pantoprazole (PROTONIX) 40 mg Oral Tablet, Delayed Release (E.C.) Take 1 Tab (40 mg total) by mouth Every morning before breakfast   . triamcinolone acetonide (ARISTOCORT A) 0.1 % Ointment by Apply Topically route Twice daily   . [DISCONTINUED] gabapentin  (NEURONTIN) 300 mg Oral Capsule Take 2 Caps (600 mg total) by mouth Once a day      Family History:  Family Medical History:     Problem Relation (Age of Onset)    Bipolar Disorder Daughter    Breast Cancer Paternal Aunt, Paternal Aunt, Paternal Aunt, Paternal Aunt, Paternal Aunt, Other    Cancer Paternal Aunt, Paternal 49, Paternal 22, Paternal 17, Paternal 51, Other (19)    Congestive Heart Failure Father (72)    Coronary Artery Disease Father, Mother (29)    Diabetes Mother    Heart Attack Father, Sister    High Cholesterol Mother    Hypertension (High Blood Pressure) Mother, Brother, Sister, Sister    Kidney Disease Sister    Leukemia Paternal Uncle, Paternal Uncle    Stroke Paternal Grandmother    Thyroid Disease Sister            Social History:  Social History     Occupational History   . 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 Smoking status: Former Smoker     Packs/day: 2.00     Years: 40.00     Pack years: 80.00     Types: Cigarettes     Last attempt to quit: 01/22/2005     Years since quitting: 13.4   . Smokeless tobacco: Never Used   Substance and Sexual Activity   . Alcohol use: No     Alcohol/week: 0.0 standard drinks   . Drug use: No   . Sexual activity: Not on file     Allergies:  Allergies   Allergen Reactions   . Cymbalta [Duloxetine]      Rash on feet and hands   . Blue Dye      Told to avoid due to patch test result by Simone Curia   . Flavoring Agent    . Lipitor [Atorvastatin] Myalgia     Muscle pain   . Nickel    . Nsaids (Non-Steroidal Anti-Inflammatory Drug)    . Codeine Itching     RASH   . Mobic [Meloxicam] Nausea/ Vomiting   . Naprosyn [Naproxen] Nausea/ Vomiting       Review of Systems:    Do you have any fevers: yes Explain Fever:  at night Any weight change: no   Change in your vision: yes Explain Vision: blurry  Chest Pain: no   Shortness of Breath: no   Stomach pain: no   Urinary difficulity: yes   Joint Pain: yes    Skin Problems: yes   Weakness or Numbness: no   Easy Bruising or Bleeding: yes   Excessive Thirst: no   Seasonal Allergies: yes    All other systems reviewed and found to be negative.    Physical Exam:  BP (Non-Invasive): 112/70 Temperature: 36.2 C (97.1 F) Heart Rate: 73      Height: 165.1 cm (5\' 5" ) Weight: 91.3 kg (201 lb 4.5 oz) Body mass index is 33.49 kg/m.    General Appearance: Pleasant, cooperative, healthy, and in no acute distress.  Eyes: Conjunctivae/corneas clear, PERRLA, EOM's intact.  Head and Face: Normocephalic, atraumatic.  Face symmetric, no obvious lesions.   Pinnae: Normal shape and position.   External auditory canals:  Patent without inflammation.  Tympanic membranes:  Intact, translucent, midposition, middle ear aerated.  Nose:  External pyramid midline. Septum midline. Mucosa normal. No purulence, polyps, or crusts.   Oral Cavity/Oropharynx: No mucosal lesions, masses, or pharyngeal asymmetry. + clicking bilaterally at TMJ; pain elicited during palpation at the right TMJ  Hypopharynx/Larynx: Deferred and voice normal.  Neck:  No palpable thyroid, salivary gland, or neck masses.  Heme/Lymph:  No cervical adenopathy.  Cardiovascular:  Good perfusion of upper extremities.  No cyanosis of the hands or fingers.  Lungs: No apparent stridorous breathing. No acute distress. Equal work of breathing without any tachypnea or accessory muscle use   Skin: Skin warm and dry.  Neurologic: Cranial nerves:  grossly intact.  Psychiatric:  Alert and oriented.    Review of Information:  None    Procedure:  No notes on file    Assessment:   1. Right otalgia- TMJ verse neuroma   2. Allergic rhinitis     Plan:  1. Discussed TMJ management with NSAID, heat application, and soft diet for 10-14 days. If otalgia does not resolve with treatment will refer her to head and neck for concern of possible neuroma   2. Recommend Flonase, saline spray, and oral antihistamine for allergic rhinitis  3. Follow up in 4 weeks  for re-evaluation    Junious Dresser, APRN,NP-C 06/30/2018 17:05     Seen independently by    Junious Dresser, APRN,NP-C with Dr. Roslyn Smiling as cosigning faculty.       Lenoard Aden, MD  07/03/2018, 08:42      CC:    PCP Dennison Mascot, MD  [id number]  [place] [address]   Referring Provider Lorn Junes, PA-C  [address]  [place], [place] [address]

## 2018-06-30 NOTE — Telephone Encounter (Signed)
Called in script as written to [place]    Hedy Camara, LPN  0/12/4740, 59:56

## 2018-07-06 ENCOUNTER — Ambulatory Visit: Payer: MEDICAID | Attending: LICENSED CLINICAL SOCIAL WORKER | Admitting: LICENSED CLINICAL SOCIAL WORKER

## 2018-07-06 DIAGNOSIS — F331 Major depressive disorder, recurrent, moderate: Secondary | ICD-10-CM | POA: Insufficient documentation

## 2018-07-07 NOTE — Progress Notes (Signed)
[facility], [facility]  [facility]  [place]  [place] [address]  Dept: [phone]  Dept Fax: [phone]     [facility]    PATIENT NAME: [name]   CHART NUMBER: [id number]   DATE OF BIRTH: 09-18-1956   DATE OF SERVICE: 07/06/2018  REFERRING PHYSICIAN:  Dr. Stephanie Coup  PROCEDURE: Individual therapy [phone]  TIME IN/OUT:  1:35 pm- 2:05pm          TOTAL TIME SPENT: 17min      PRIMARY CARE PSYCHOLOGY PROGRESS NOTE    SUBJECTIVE: [name] reported that her mood has improved.  She noted she feels successful and safe in her own apartment.  She reports having nightmare for the last two nights.  She relates the nightmares to her stress over her relationship with her friend.  She noted she feels her friend "puts her down".  Patient feels angry that her friend called her a "side kick".  Patient reports her relationship with her daughters has improved.  She was able to babysit her grandchildren.  She noted she enjoyment in spending time with her grandchildren.  Patient reports her children's father passed away in 03-27-2023.  She feels this has brought her children closer to her. Patient has been attending church and bingo.      OBJECTIVE:   The patient was a well-groomed, polite and cooperative white female. Rapport was easily established and maintained. Affect was broad and spontaneous and congruent with thought content. Speech and thought process were linear and coherent and thought content was devoid of psychotic features. Eye contact was appropriate. Mood was described as "angry". Concentration and memory are within normal limits. Patient displayed good judgment and insight. Suicidal ideation was denied. There was no evidence of mania, hallucinations, delusions, paranoia, obsessions, compulsions, or homicidal ideation.      DIAGNOSTIC IMPRESSION:   Depression    PROCEDURES/PLAN:   Provided supportive counseling to allow patient to express her  feeling regarding her relationship with her friend.  Practiced use on "I" statement for patient to communicate her feeling without becoming angry.  Brainstormed with patient the characteristics of a positive relationship. Patient will increase involvement in activities with others where she feels their is a positive relationship.Patient was agreeable to these recommendations and plans to return to clinic in 3 weeks to assess symptom status and progress toward goals.At the next session, we will continue CBT skills. Further treatment is warranted to obtain treatment goals. Prognosis is good.        Samuella Bruin, MSW, [place]  [facility]  [place]  [place] [address]  Dept: [phone]  Dept Fax: [phone]

## 2018-07-12 IMAGING — DX DG CHEST 1V PORT
1 series · 1 of 1 positions shown · non-contrast
Comparison: None.

CLINICAL DATA: Pt states mid CP started this am and is getting
worse. Pt also says she feels like someone is sitting on her chest.
Former smoker. Hx of HTN. Former smoker

EXAM:
PORTABLE CHEST 1 VIEW

[chest ap]
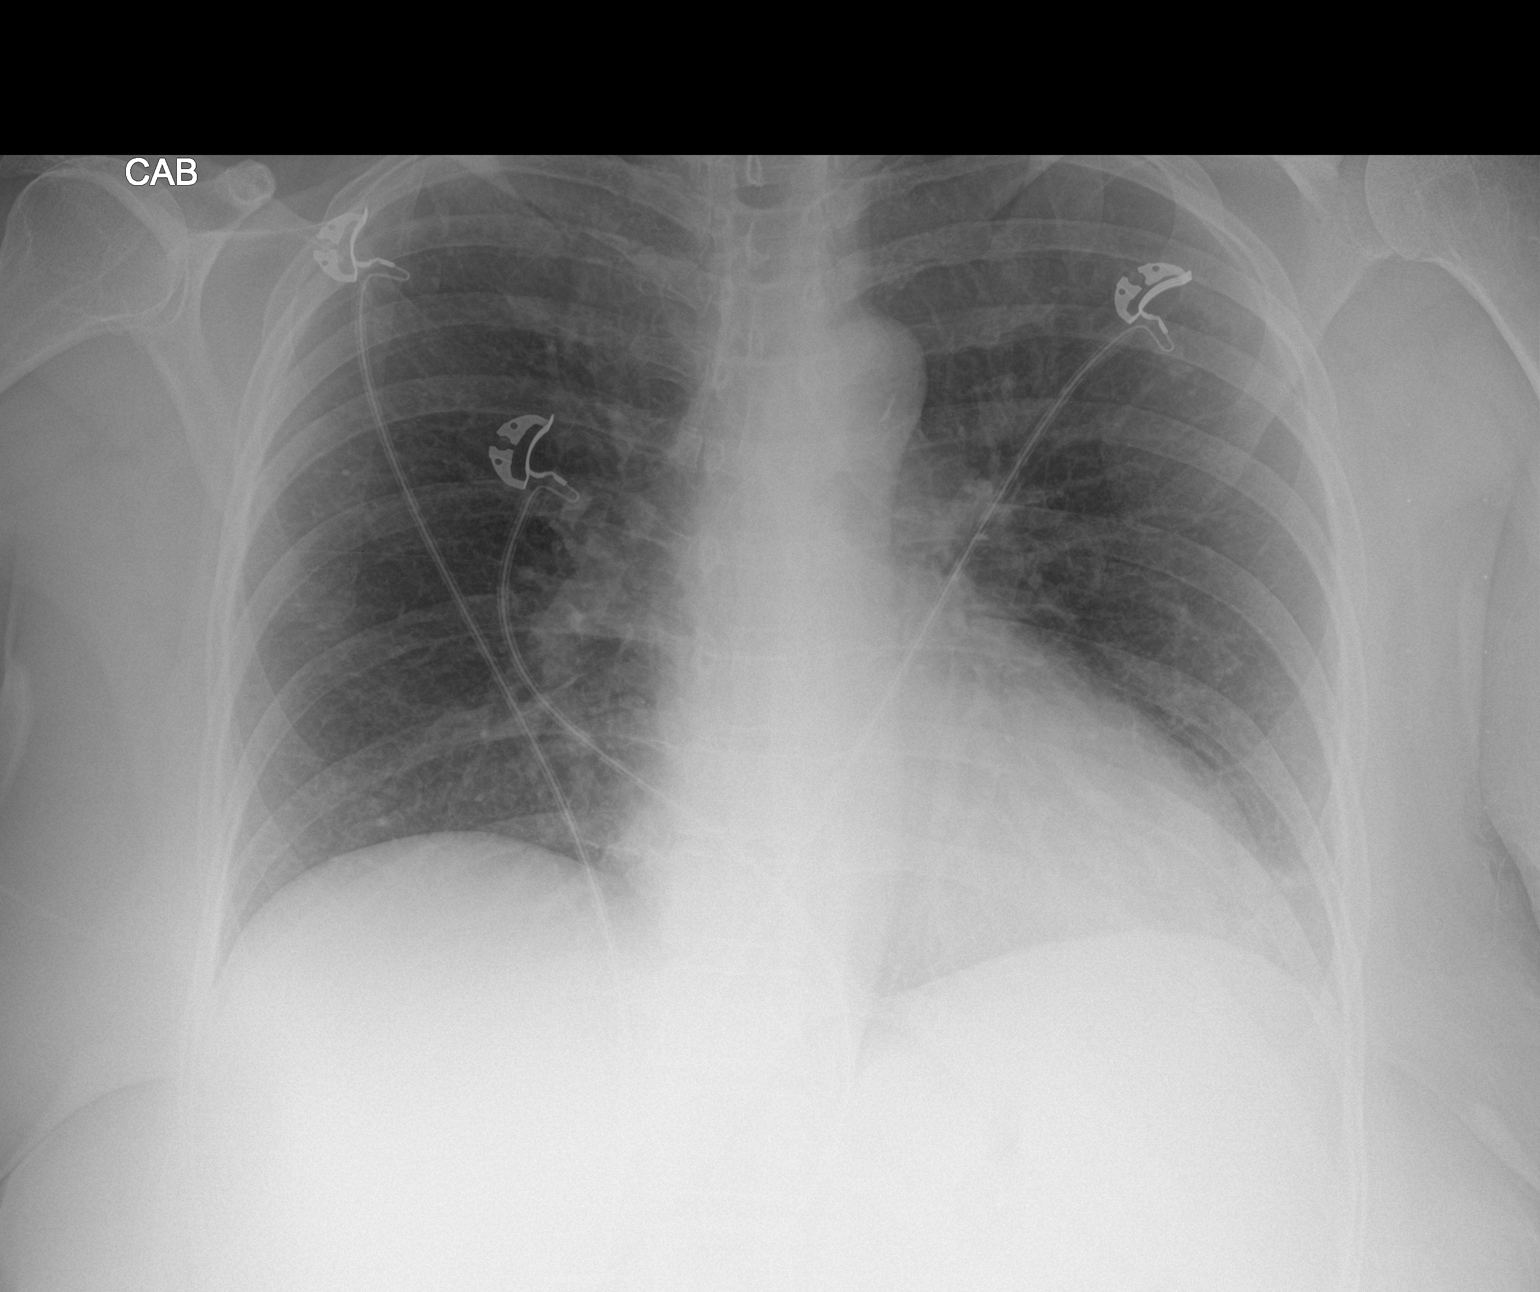

[1 of 1 positions shown; findings below may reference images not displayed]

FINDINGS: The heart, mediastinum and hila are unremarkable.

The lungs are clear.

No pleural effusion.  No pneumothorax.

Skeletal structures are grossly intact.
IMPRESSION: No active disease.

## 2018-07-18 ENCOUNTER — Ambulatory Visit (HOSPITAL_COMMUNITY): Payer: Self-pay | Admitting: Psychiatry

## 2018-07-18 DIAGNOSIS — F331 Major depressive disorder, recurrent, moderate: Secondary | ICD-10-CM

## 2018-07-18 DIAGNOSIS — G518 Other disorders of facial nerve: Secondary | ICD-10-CM | POA: Insufficient documentation

## 2018-07-18 MED ORDER — FLUOXETINE 20 MG TABLET
20.00 mg | ORAL_TABLET | Freq: Two times a day (BID) | ORAL | 5 refills | Status: DC
Start: 2018-07-18 — End: 2019-01-29

## 2018-07-18 NOTE — Progress Notes (Signed)
Department of Family Medicine    [name]  DOB: [birth year], 62 y.o.   MRN: [id number]  Date of encounter: 07/20/2018    Chief Complaint   Patient presents with   . Right Arm Pain   . Vitamin D Deficiency     Subjective:    62 y.o. female presenting for follow up on multiple medical concerns.    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 Ardeen Jourdain for counseling with FM behavioral medicine  - states mood usually worsens during winter, tries to keep herself busy with crafts which helps some     PHQ Questionnaire  Little interest or pleasure in doing things.: More than half the days  Feeling down, depressed, or hopeless: More than half the days  Trouble falling or staying asleep, or sleeping too much.: Nearly every day  Feeling tired or having little energy: Nearly every day  Poor appetite or overeating: Not at all  Feeling bad about yourself/ that you are a failure in the past 2 weeks?: Not at all  Trouble concentrating on things in the past 2 weeks?: Nearly every day  Moving/Speaking slowly or being fidgety or restless  in the past 2 weeks?: Not at all  Thoughts that you would be better off DEAD, or of hurting yourself in some way.: Not at all  If you checked off any problems, how difficult have these problems made it for you to do your work, take care of  things at home, or get along with other people?: Not difficult at all  PHQ 9 Total: 13  Interpretation of Total Score: 10-14 Moderate depression      Pulmonary Nodule  - 05/01/18 Lung Nodule 25mm at left base  - recommend repeat CT in 6 months  - smoking history- none currently, quit smoking in 2006, was smoking 1-2ppd for 40years     Bilateral LE Pain at Night  - has been going on for several months  - much worse when she lays down at night  - is an aching sensation relieve with Advil       Med reconciliation completed in today's visit. No Refills needed.   Medical, familial, surgical, and social history updated in EMR where appropriate.     Allergies   Allergen Reactions   . Cymbalta [Duloxetine]      Rash on feet and hands   . Blue Dye      Told to avoid due to patch test result by Simone Curia   . Flavoring Agent    . Lipitor [Atorvastatin] Myalgia     Muscle pain   . Nickel    . Nsaids (Non-Steroidal  Anti-Inflammatory Drug)    . Codeine Itching     RASH   . Mobic [Meloxicam] Nausea/ Vomiting   . Naprosyn [Naproxen] Nausea/ Vomiting     Current Outpatient Medications   Medication Sig   . acetaminophen (TYLENOL) 325 mg Oral Tablet Take 2 Tabs (650 mg total) by mouth Three times a day   . amoxicillin (AMOXIL) 500 mg Oral Capsule 500 mg Three times a day Take x10 days   . aspirin 81 mg Oral Tablet, Chewable Take 1 Tab (81 mg total) by mouth Once a day   . Azelaic Acid 20 % Cream Apply topically Twice daily   . cholecalciferol, vitamin D3, 1,000 unit Oral Tablet Take 1 Tab (1,000 Units total) by mouth Once a day   . Clobetasol (TEMOVATE) 0.05 % Ointment Apply topically Twice daily To rash on hands and feet   . FLUoxetine (SARAFEM) 20 mg Oral Tablet Take 1 Tab (20 mg total) by mouth Twice daily   . gabapentin (NEURONTIN) 300 mg Oral Capsule Take 2 Caps (600 mg total) by mouth Once a day   . Halobetasol Propionate (ULTRAVATE) 0.05 % Cream Apply  topically. Three times a week   . Metronidazole (METROCREAM) 0.75 % Cream Apply  topically Twice daily   . nitroGLYCERIN (NITROSTAT) 0.4 mg Sublingual Tablet, Sublingual 1 Tab (0.4 mg total) by Sublingual route Every 5 minutes as needed for Chest pain for up to 3 doses for 3 doses over 15 minutes   . nystatin (NYSTOP) 100,000 unit/gram Powder Apply once or twice daily to PREVENT rash under breasts   . pantoprazole (PROTONIX) 40 mg Oral Tablet, Delayed Release (E.C.) Take 1 Tab (40 mg total) by mouth Every morning before breakfast   . triamcinolone acetonide (ARISTOCORT A) 0.1 % Ointment by Apply Topically route Twice daily     Review of Systems: Positive ROS discussed in HPI, otherwise all other systems negative.  Social History     Tobacco Use   . Smoking status: Former Smoker     Packs/day: 2.00     Years: 40.00     Pack years: 80.00     Types: Cigarettes     Last attempt to quit: 01/22/2005     Years since quitting: 13.4   . Smokeless tobacco: Never Used   Substance Use Topics   . Alcohol use: No     Alcohol/week: 0.0 standard drinks   . Drug use: No     Objective:  Blood pressure 110/72, pulse 81, temperature 36.5 C (97.7 F), temperature source Thermal Scan, height 1.651 m (5\' 5" ), weight 92.4 kg (203 lb 11.3 oz), SpO2 96 %, not currently breastfeeding.Body mass index is 33.9 kg/m.    Constitutional: appears stated age, in no acute distress  Eyes: pupils equal and round  Neck: supple, symmetric, trachea midline  Respiratory: non-labored breathing, clear to auscultation bilaterally  Cardiovascular: regular rate and rhythm, no murmurs/gallops/rubs  Abdomen: soft, non-distended, non-tender, no guarding  Extremities: no cyanosis or edema, right lateral elbow tender to palpation  Skin: warm and dry, no rash  Neuro: grossly normal  Psychiatric: normal affect and behavior    The 10-year ASCVD risk score Mikey Bussing DC Jr., et al., 2013) is: 3.8%    Values used to calculate the score:      Age: 67 years      Sex: Female      Is Non-Hispanic African American: No      Diabetic: No      Tobacco smoker:  No      Systolic Blood Pressure: 130 mmHg      Is BP treated: No      HDL Cholesterol: 43 mg/dL      Total Cholesterol: 257 mg/dL      Assessment/Plan:  [name] 62 y.o. female     Chronic Right Facial Pain  - stable  - continue Gabapentin    MDD  PTSD  - stable  - continue Prozac    Lung Nodule  - ordered repeat CT chest w/o IV contrast for January 2020    Hx Multinodular Goiter  - 12/12/17 CT soft tissue neck- postsurgical changes related to R partial parotidectomy, thyroid gland normal in size  - 08/03/12 Thyroid US- multiple small nodules identified, stable from prior imaging, findings consistent with multinodular goiter  - 02/23/18 TSH 0.977  - per recommendations, goiter should be monitored with TSH annually and thyroid US every 2-5 years once proven stable  - will order repeat thyroid US       Vitamin D Deficiency  - Vitamin D 19 on 03/09/18  - continue Vitamin D 1000 units QD    Preventative  Colonoscopy- 01/06/09 colonoscopy showed diverticula and recommended to be done again in 5 years; was DUE 2015   Mammogram- 02/23/18 normal      RTC 6 months    Dennison Mascot, MD  [facility]  Department of [facility] Medicine  PGY-2      I saw and examined the patient.  I reviewed the resident's note.  I agree with the findings and plan of care as documented in the resident's note.  Any exceptions/additions are edited/noted.    Martha Clan, DO

## 2018-07-18 NOTE — Telephone Encounter (Signed)
-----   Message from [name] sent at 07/17/2018 10:14 AM EDT -----  [name] pt    Pt is claiming that she ran out of Prozac early and the pharmacy shorted her. I do not see this medication in the chart.       Preferred Pharmacy     [id number] - [place], [place] - [address]    [address] [place] [address]    Phone: [phone] Fax: [phone]    Not a 24 hour pharmacy; exact hours not known.

## 2018-07-20 ENCOUNTER — Encounter (HOSPITAL_BASED_OUTPATIENT_CLINIC_OR_DEPARTMENT_OTHER): Payer: Self-pay | Admitting: Family Medicine

## 2018-07-20 ENCOUNTER — Ambulatory Visit: Payer: MEDICAID | Attending: Family Medicine | Admitting: Family Medicine

## 2018-07-20 VITALS — BP 110/72 | HR 81 | Temp 97.7°F | Ht 65.0 in | Wt 203.7 lb

## 2018-07-20 DIAGNOSIS — Z87891 Personal history of nicotine dependence: Secondary | ICD-10-CM | POA: Insufficient documentation

## 2018-07-20 DIAGNOSIS — F331 Major depressive disorder, recurrent, moderate: Secondary | ICD-10-CM

## 2018-07-20 DIAGNOSIS — F4310 Post-traumatic stress disorder, unspecified: Secondary | ICD-10-CM | POA: Insufficient documentation

## 2018-07-20 DIAGNOSIS — G518 Other disorders of facial nerve: Secondary | ICD-10-CM

## 2018-07-20 DIAGNOSIS — M542 Cervicalgia: Secondary | ICD-10-CM

## 2018-07-20 DIAGNOSIS — R51 Headache: Secondary | ICD-10-CM | POA: Insufficient documentation

## 2018-07-20 DIAGNOSIS — IMO0001 Reserved for inherently not codable concepts without codable children: Secondary | ICD-10-CM

## 2018-07-20 DIAGNOSIS — E042 Nontoxic multinodular goiter: Secondary | ICD-10-CM | POA: Insufficient documentation

## 2018-07-20 DIAGNOSIS — I10 Essential (primary) hypertension: Secondary | ICD-10-CM

## 2018-07-20 DIAGNOSIS — R911 Solitary pulmonary nodule: Secondary | ICD-10-CM | POA: Insufficient documentation

## 2018-07-20 DIAGNOSIS — F329 Major depressive disorder, single episode, unspecified: Secondary | ICD-10-CM | POA: Insufficient documentation

## 2018-07-20 DIAGNOSIS — Z23 Encounter for immunization: Secondary | ICD-10-CM

## 2018-07-20 MED ORDER — GABAPENTIN 300 MG CAPSULE
600.0000 mg | ORAL_CAPSULE | Freq: Every day | ORAL | 5 refills | Status: DC
Start: 2018-07-20 — End: 2018-12-06

## 2018-07-20 NOTE — Nursing Note (Signed)
1. Are you 61 years of age or older? yes  2. Have you ever had a severe reaction to a flu shot? no  3. Are you allergic to eggs? no  4. Are you allergic to latex? no  5. Are you allergic to Thimerosol? no  6. Are you experiencing acute illness symptoms or have you been running a fever? no  7. Do you have a medical condition or taking medications that suppress your immune system? no  8. Have you ever had Guillain-Barre syndrome or other neurologic disorder? no  9. Are you pregnant or breastfeeding? no    Immunization administered     Name Date Dose VIS Date Route    INFLUENZA VACCINE IM  Incomplete 0.5 mL 06/08/2018 Intramuscular    Site: Left arm    Given By: Vaughan Basta, MA    Manufacturer: [employer]oSmith[employer]    Lot: [id number]    [place]: [id number]          Vaughan Basta, [place] 07/20/2018, 13:37

## 2018-07-25 ENCOUNTER — Ambulatory Visit
Admission: RE | Admit: 2018-07-25 | Discharge: 2018-07-25 | Disposition: A | Discharge status: Home / Self Care | Payer: MEDICAID | Source: Ambulatory Visit | Attending: Family Medicine | Admitting: Family Medicine

## 2018-07-25 DIAGNOSIS — E042 Nontoxic multinodular goiter: Secondary | ICD-10-CM | POA: Insufficient documentation

## 2018-07-31 ENCOUNTER — Encounter (HOSPITAL_BASED_OUTPATIENT_CLINIC_OR_DEPARTMENT_OTHER): Payer: Self-pay | Admitting: Family Medicine

## 2018-07-31 ENCOUNTER — Encounter (INDEPENDENT_AMBULATORY_CARE_PROVIDER_SITE_OTHER): Payer: Self-pay | Admitting: Family

## 2018-07-31 DIAGNOSIS — IMO0001 Reserved for inherently not codable concepts without codable children: Secondary | ICD-10-CM | POA: Insufficient documentation

## 2018-07-31 DIAGNOSIS — R911 Solitary pulmonary nodule: Secondary | ICD-10-CM | POA: Insufficient documentation

## 2018-08-03 ENCOUNTER — Ambulatory Visit: Payer: MEDICAID | Attending: NURSE PRACTITIONER, FAMILY | Admitting: Urology

## 2018-08-03 ENCOUNTER — Encounter (INDEPENDENT_AMBULATORY_CARE_PROVIDER_SITE_OTHER): Payer: Self-pay | Admitting: Urology

## 2018-08-03 VITALS — BP 110/60 | HR 74 | Temp 97.3°F | Ht 65.0 in | Wt 203.9 lb

## 2018-08-03 DIAGNOSIS — Z87891 Personal history of nicotine dependence: Secondary | ICD-10-CM | POA: Insufficient documentation

## 2018-08-03 DIAGNOSIS — N3281 Overactive bladder: Secondary | ICD-10-CM | POA: Insufficient documentation

## 2018-08-03 DIAGNOSIS — R32 Unspecified urinary incontinence: Secondary | ICD-10-CM | POA: Insufficient documentation

## 2018-08-03 DIAGNOSIS — Z79899 Other long term (current) drug therapy: Secondary | ICD-10-CM | POA: Insufficient documentation

## 2018-08-03 MED ORDER — TOLTERODINE 2 MG TABLET
2.00 mg | ORAL_TABLET | Freq: Two times a day (BID) | ORAL | 11 refills | Status: DC
Start: 2018-08-03 — End: 2018-08-18

## 2018-08-03 NOTE — Progress Notes (Signed)
[facility]  OF UROLOGY  HISTORY & PHYSICAL      Patient Name: [name] Number: [id number]  Date of Birth: [birth year]  Date of Service: 08/03/2018    Chief Complaint:   Chief Complaint   Patient presents with   . Other     overactive bladder       HPI: [name] is a 62 y.o. female who presents here today for complaints of  Unable to take Ditropan due to it causing her to go into urinary retention.  She has she tried it for about a week and she was barely able to get her urine out of her bladder.  She states she would like to try different medication to see if it has the same affect on her bladder.  She has not had any bladder infections or gross hematuria.  She states that she does wear a safety pad throughout the day in case she can't make it to the bathroom in time.  She did have a recent cysto CMG workup done 3 months ago.    PMHx:   Past Medical History:   Diagnosis Date   . Arthritis    . Cancer (CMS HCC)     internal skin cancer   . Depression    . Essential hypertension 02/16/2017   . GERD (gastroesophageal reflux disease)     controlled w/med   . Heart murmur 1979    benign   . Hyperlipidemia LDL goal < 100 08/29/2013   . MINOR CAD (coronary artery disease) 11/10/2012    no treatment other than cholesterol medications; follows regularly with Dr. Rodney Booze   . Multinodular goiter    . Rheumatic fever 16    no complications   . Shortness of breath     on exertion   . Squamous cell carcinoma 02/06/2015    right side of neck   . Vaginal prolapse 2012    surgery improved significantly   . Wears glasses            PSHx:   Past Surgical History:   Procedure Laterality Date   . COLONOSCOPY  01/06/09    COLONOSCOPY performed by Loletta Parish F at [facility]   . GASTROSCOPY  03/05/2010    GASTROSCOPY performed by Jocelyn Lamer at [place]   . GASTROSCOPY WITH BIOPSY  03/05/2010    GASTROSCOPY WITH BIOPSY performed by Jocelyn Lamer at [place]   . HX  ADENOIDECTOMY     . HX ANKLE FRACTURE [place]  2007    left distal fibula, casted   . HX APPENDECTOMY     . HX CHOLECYSTECTOMY     . HX COLONOSCOPY     . HX CYSTOCELE REPAIR  09/24/09   . HX HAND SURGERY  2012    for Carpal tunnel    . HX OOPHORECTOMY      left ovary removed   . HX TONSILLECTOMY     . HX TOTAL VAGINAL HYSTERECTOMY  1979   . HX WRIST FRACTURE [place]  2007    [place] injury   . PB REVISE ULNAR NERVE AT ELBOW  1979   . PB UPPER GI ENDOSCOPY,BIOPSY  12/19/07    patulous GE junction zone, erythema, nonerosive GERD           Family Hx:   Family Medical History:     Problem Relation (Age of Onset)    Bipolar Disorder  Daughter    Breast Cancer Paternal Aunt, Paternal Aunt, Paternal Aunt, Paternal 35, Paternal Aunt, Other    Cancer Paternal Aunt, Paternal 23, Paternal 98, Paternal 66, Paternal 52, Other (52)    Congestive Heart Failure Father (26)    Coronary Artery Disease Father, Mother (72)    Diabetes Mother    Heart Attack Father, Sister    High Cholesterol Mother    Hypertension (High Blood Pressure) Mother, Brother, Sister, Sister    Kidney Disease Sister    Leukemia Paternal Uncle, Paternal Uncle    Stroke Paternal Grandmother    Thyroid Disease Sister              Social Hx:   Social History     Socioeconomic History   . Marital status: Divorced     Spouse name: Not on file   . Number of children: Not on file   . Years of education: Not on file   . Highest education level: Not on file   Occupational History   . 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 Financial resource strain: Not on file   . Food insecurity:     Worry: Not on file     Inability: Not on file   . Transportation needs:     Medical: Not on file     Non-medical: Not on file   Tobacco Use   . Smoking status: Former Smoker     Packs/day: 2.00     Years: 40.00     Pack years: 80.00     Types: Cigarettes     Last attempt to quit: 01/22/2005     Years since quitting: 13.5   .  Smokeless tobacco: Never Used   Substance and Sexual Activity   . Alcohol use: No     Alcohol/week: 0.0 standard drinks   . Drug use: No   . Sexual activity: Not on file   Lifestyle   . Physical activity:     Days per week: Not on file     Minutes per session: Not on file   . Stress: Not on file   Relationships   . Social connections:     Talks on phone: Not on file     Gets together: Not on file     Attends religious service: Not on file     Active member of club or organization: Not on file     Attends meetings of clubs or organizations: Not on file     Relationship status: Not on file   . Intimate partner violence:     Fear of current or ex partner: Not on file     Emotionally abused: Not on file     Physically abused: Not on file     Forced sexual activity: Not on file   Other Topics Concern   . Abuse/Domestic Violence Not Asked   . Breast Self Exam Not Asked   . Caffeine Concern Not Asked   . Calcium intake adequate Not Asked   . Computer Use Not Asked   . Exercise Concern Not Asked   . Helmet Use Not Asked   . Seat Belt Not Asked   . Special Diet Not Asked   . Sunscreen used Not Asked   . Uses Cane No   . Uses walker No   . Uses wheelchair No   . Right hand dominant  Yes   . Left hand dominant No   . Ambidextrous No   . Ability to Walk 1 Flight of Steps without SOB/CP Yes   . Routine Exercise Yes     Comment: walk   . Ability to Walk 2 Flight of Steps without SOB/CP Yes   . Unable to Ambulate Not Asked   . Total Care Not Asked   . Ability To Do Own ADL's Yes   . Uses Walker Not Asked   . Other Activity Level Not Asked   . Uses Cane Not Asked   . Drives Not Asked   . Shift Work No   . Unusual Sleep-Wake Schedule No   Social History Narrative   . Not on file       Medications:   Current Outpatient Medications   Medication Sig   . acetaminophen (TYLENOL) 325 mg Oral Tablet Take 2 Tabs (650 mg total) by mouth Three times a day   . amoxicillin (AMOXIL) 500 mg Oral Capsule 500 mg Three times a day Take x10 days   .  aspirin 81 mg Oral Tablet, Chewable Take 1 Tab (81 mg total) by mouth Once a day   . Azelaic Acid 20 % Cream Apply topically Twice daily   . cholecalciferol, vitamin D3, 1,000 unit Oral Tablet Take 1 Tab (1,000 Units total) by mouth Once a day   . Clobetasol (TEMOVATE) 0.05 % Ointment Apply topically Twice daily To rash on hands and feet   . FLUoxetine (SARAFEM) 20 mg Oral Tablet Take 1 Tab (20 mg total) by mouth Twice daily   . gabapentin (NEURONTIN) 300 mg Oral Capsule Take 2 Caps (600 mg total) by mouth Once a day   . Halobetasol Propionate (ULTRAVATE) 0.05 % Cream Apply  topically. Three times a week   . Metronidazole (METROCREAM) 0.75 % Cream Apply topically Twice daily   . nitroGLYCERIN (NITROSTAT) 0.4 mg Sublingual Tablet, Sublingual 1 Tab (0.4 mg total) by Sublingual route Every 5 minutes as needed for Chest pain for up to 3 doses for 3 doses over 15 minutes   . nystatin (NYSTOP) 100,000 unit/gram Powder Apply once or twice daily to PREVENT rash under breasts   . pantoprazole (PROTONIX) 40 mg Oral Tablet, Delayed Release (E.C.) Take 1 Tab (40 mg total) by mouth Every morning before breakfast   . tolterodine (DETROL) 2 mg Oral Tablet Take 1 Tab (2 mg total) by mouth Twice daily   . triamcinolone acetonide (ARISTOCORT A) 0.1 % Ointment by Apply Topically route Twice daily       Allergies:   Allergies   Allergen Reactions   . Cymbalta [Duloxetine]      Rash on feet and hands   . Blue Dye      Told to avoid due to patch test result by Simone Curia   . Flavoring Agent    . Lipitor [Atorvastatin] Myalgia     Muscle pain   . Nickel    . Nsaids (Non-Steroidal Anti-Inflammatory Drug)    . Codeine Itching     RASH   . Mobic [Meloxicam] Nausea/ Vomiting   . Naprosyn [Naproxen] Nausea/ Vomiting       ROS:  Constitutional- Denies any weight loss/gain, fatigue, fever or chills  Allergic/Immunologics: Denies any hay fever, HIV/AIDS  Integumentary: Denies any skin color changes, itching, rash/hives  Eyes: Denies any vision  changes, dryness, pain  ENT: Denies any tinnitus, discharge, obstruction, pain  Cardiovascular: Denies any chest pain upon exertion, palpations, angina  Respiratory: Denies any cough, infection, sputum, wheezing  Musculoskeletal: Denies any pain, weakness, cramps, or joint pain  Gastrointestinal: Denies any bloating, nausea, vomiting, diarrhea  Neurological: Denies any seizures, vertigo, memory loss, tremors  Genitourinary:  Denies any gross hematuria or dysuria  Psychiatric: Denies any depression, anxiety, mood changes    All 12 systems were completed.     Physical Exam:  Constitutional: Appropriate attire BP 110/60   Pulse 74   Temp 36.3 C (97.3 F) (Thermal Scan)   Ht 1.651 m (5\' 5" )   Wt 92.5 kg (203 lb 14.8 oz)   SpO2 96% Comment: room air  BMI 33.93 kg/m       Eyes - conjunctiva clear, no drainage noted  HENT - normocephalic,  no nasal/throat drainage, no discharge from ears  Respiratory - Unlabored breathing, no use of accessory muscles, no wheezing heard  Gastrointestinal - soft, NT/ND, no CVAT  Musculoskeletal - no clubbing, strength appropriate, ROM appropriate  Genitourinary-  deferred  Skin - warm and dry, no skin tears noted  Psychiatric - Normal affect, appropriate use of language, alert/oriented  Hematological/Lymphatic: No lymphadenopathy, no active bleeding    All 10 systems were completed.     Urine Dip Results:  Urine Dip Results:   Time collected: 1127  Glucose: Negative  Bilirubin: Negative  Ketones: Negative  Urine Specific Gravity: 1.005  Blood (urine): Negative  pH: 6.5  Protein: (!) Trace  Urobilinogen: Normal   Nitrite: Negative  Leukocytes: Negative          Labs/Diagnostic Imagining:    Lab Results   Component Value Date/Time    COLOR Normal (Yellow) 12/12/2017 03:52 PM    SPECGRAVUR 1.014 12/12/2017 03:52 PM    PHURINE 5.0 12/12/2017 03:52 PM    PROTEIN Negative 12/12/2017 03:52 PM    GLUCOSE Negative 12/12/2017 03:52 PM    KETONES Negative 12/12/2017 03:52 PM    UROBILINOGEN  Negative 12/12/2017 03:52 PM    LEUKOCYTES Large (A) 12/12/2017 03:52 PM    NITRITE Negative 12/12/2017 03:52 PM    WBC 8.2 12/12/2017 03:52 PM    WBC 7.0 01/23/2015 11:03 AM    RBC 4.95 (H) 12/12/2017 03:52 PM    RBC 4.92 01/23/2015 11:03 AM    BACTERIA Occasional or less 12/12/2017 03:52 PM    BILIRUBIN Negative 12/12/2017 03:52 PM    HGBURINE Small (A) 12/12/2017 03:52 PM       Basic Metabolic Profile    Lab Results   Component Value Date/Time    SODIUM 137 02/23/2018 02:50 PM    SODIUM 137 01/23/2015 11:03 AM    POTASSIUM 4.2 02/23/2018 02:50 PM    POTASSIUM 4.3 01/23/2015 11:03 AM    CHLORIDE 101 02/23/2018 02:50 PM    CHLORIDE 104 01/23/2015 11:03 AM    CO2 28 02/23/2018 02:50 PM    CO2 23 01/23/2015 11:03 AM    ANIONGAP 8 02/23/2018 02:50 PM    ANIONGAP 10 01/23/2015 11:03 AM    Lab Results   Component Value Date/Time    BUN 16 02/23/2018 02:50 PM    BUN 15 01/23/2015 11:03 AM    CREATININE 1.10 02/23/2018 02:50 PM    CREATININE 1.15 (H) 01/23/2015 11:03 AM    GLUCOSEFAST 104 08/29/2013 09:41 AM    GLUCOSENF 90 02/23/2018 02:50 PM    GLUCOSENF 87 01/23/2015 11:03 AM        Assessment:  62 y.o. female with   1. overactive bladder  2. urinary incontinence  Plan:   Ditropan caused the patient to have urinary retention so she did stop that medication on her preferred medication list it does come up as Toviaz as the next medication but she has a severe allergy to blue dye and that is an active ingredients in that medication so I am trying her on Detrol 2 mg a day.  I told the patient that if she does get urinary retention to stop the medication immediately she may just be very sensitive disease classification of medicines.  SHe has a lot of other health issues going on at this time shows a nodule in her thyroid as well as her right breast.  I made her 6 month appointment at this medicine is doing well for her and total we can just see her back once a year and she can cancel that appointment in 6 months if the  medication does the same thing and she wants to try any of the other medications I told her to give me a call and I can switch her out to a different medication the lowest dose of course the that she does not have any side effects with that    [name]  was offered the opportunity to voice questions or concerns regarding today's visit, the plan of care set forth to them by provider, and actions that have been or will be taken for them on their behalf. [name] did not have any questions or concerns at the end of the visit today, and furthermore, [name] was satisfied with the above mentioned plan of care and follow up. [name] is agreeable to the above plan of care and will follow up with urology or his PCP sooner should their condition worsen or the need arise.     Patient seen independently.    Katha Cabal, APRN,NP-C 08/03/2018, 11:57  Katha Cabal APRN, FNP-C  Dr. Matthew Folks Staff.   [facility] Medicine  Department of Urology  [phone]  Fax- [phone]  Pager- [phone]    The patient was evaluated independently in the clinic by the advanced practice professional. I was available for consultation but did not participate in the evaluation or management of this patient.    Raymond Gurney., MD, FACS  Associate Program Director, Urology  Associate Professor of Urology  [facility] Department of Urology

## 2018-08-08 ENCOUNTER — Ambulatory Visit (INDEPENDENT_AMBULATORY_CARE_PROVIDER_SITE_OTHER): Payer: Self-pay | Admitting: Family

## 2018-08-08 ENCOUNTER — Other Ambulatory Visit (HOSPITAL_BASED_OUTPATIENT_CLINIC_OR_DEPARTMENT_OTHER): Payer: Self-pay | Admitting: Family Medicine

## 2018-08-08 ENCOUNTER — Encounter (INDEPENDENT_AMBULATORY_CARE_PROVIDER_SITE_OTHER): Payer: Self-pay | Admitting: Family

## 2018-08-08 VITALS — BP 134/68 | HR 69 | Temp 97.0°F | Ht 65.0 in | Wt 207.2 lb

## 2018-08-08 DIAGNOSIS — E559 Vitamin D deficiency, unspecified: Secondary | ICD-10-CM

## 2018-08-08 DIAGNOSIS — Z5329 Procedure and treatment not carried out because of patient's decision for other reasons: Secondary | ICD-10-CM

## 2018-08-08 DIAGNOSIS — Z91199 Patient's noncompliance with other medical treatment and regimen due to unspecified reason: Secondary | ICD-10-CM

## 2018-08-08 NOTE — Progress Notes (Signed)
Pt needs re-scheduled with head and neck. Appointment scheduled for Friday with Dr. Posey Pronto.

## 2018-08-09 ENCOUNTER — Ambulatory Visit (INDEPENDENT_AMBULATORY_CARE_PROVIDER_SITE_OTHER): Payer: MEDICAID | Admitting: Psychiatry

## 2018-08-09 DIAGNOSIS — F331 Major depressive disorder, recurrent, moderate: Secondary | ICD-10-CM

## 2018-08-09 DIAGNOSIS — F4310 Post-traumatic stress disorder, unspecified: Secondary | ICD-10-CM

## 2018-08-09 NOTE — Progress Notes (Signed)
[place]  Department of Behavioral Medicine and Psychiatry    Outpatient Progress Note      Dorina Ribaudo   [id number]  1956/07/04    DOS: 08/09/2018    Chief complaint:   Chief Complaint   Patient presents with   . Medication Check   . Depressed Mood        ID:  [name] is a 62 y.o. female who  has a past medical history of Arthritis, Cancer (CMS [place]), Depression, Essential hypertension (02/16/2017), GERD (gastroesophageal reflux disease), Heart murmur (1979), Hyperlipidemia LDL goal < 100 (08/29/2013), MINOR CAD (coronary artery disease) (11/10/2012), Multinodular goiter, Rheumatic fever (16), Shortness of breath, Squamous cell carcinoma (02/06/2015), Vaginal prolapse (2012), and Wears glasses.    Subjective:  Patient presents alone for a follow-up visit for management of  depression. Patient was last seen in clinic on 07/18/2018.    Patient reports medications have been working well.  She has some distress about her health.  She received news that she has a mass on her thyroid as well as a single nodule discovered her lung.  She has a history of a multinodular goiter and is scheduled to have her thyroid biopsied and also has a follow-up CT exam of her long in January.  She has been spending her time organizing her home.  She is also looking forward to spending time with crafts.      Reported side effects: no insomnia, no changes in appetite or weight, no galactorrhea or EPS, no tremor, no rash     Collateral obtained/reviewed:     Medical Hx: no changes since last  visit   Social Hx: no changes since last visit  Previous medication trials:    -     Current Medications:  Current Outpatient Medications   Medication Sig   . acetaminophen (TYLENOL) 325 mg Oral Tablet Take 2 Tabs (650 mg total) by mouth Three times a day   . amoxicillin (AMOXIL) 500 mg Oral Capsule 500 mg Three times a day Take x10 days   . aspirin 81 mg Oral Tablet, Chewable Take 1 Tab (81 mg total) by mouth Once a  day   . Azelaic Acid 20 % Cream Apply topically Twice daily   . cholecalciferol, vitamin D3, 1,000 unit (25 mcg) Oral Tablet Take 1 Tab (1,000 Units total) by mouth Once a day   . Clobetasol (TEMOVATE) 0.05 % Ointment Apply topically Twice daily To rash on hands and feet   . FLUoxetine (SARAFEM) 20 mg Oral Tablet Take 1 Tab (20 mg total) by mouth Twice daily   . gabapentin (NEURONTIN) 300 mg Oral Capsule Take 2 Caps (600 mg total) by mouth Once a day   . Halobetasol Propionate (ULTRAVATE) 0.05 % Cream Apply  topically. Three times a week   . Metronidazole (METROCREAM) 0.75 % Cream Apply topically Twice daily   . nitroGLYCERIN (NITROSTAT) 0.4 mg Sublingual Tablet, Sublingual 1 Tab (0.4 mg total) by Sublingual route Every 5 minutes as needed for Chest pain for up to 3 doses for 3 doses over 15 minutes   . nystatin (NYSTOP) 100,000 unit/gram Powder Apply once or twice daily to PREVENT rash under breasts   . pantoprazole (PROTONIX) 40 mg Oral Tablet, Delayed Release (E.C.) Take 1 Tab (40 mg total) by mouth Every morning before breakfast   . tolterodine (DETROL) 2 mg Oral Tablet Take 1 Tab (2 mg total) by mouth Twice daily   . triamcinolone  acetonide (ARISTOCORT A) 0.1 % Ointment by Apply Topically route Twice daily         Review of systems:   Constitutional: no insomnia, no changes in appetite, no weight loss/gain  Cardiac: no chest pain/palpitations  Resp: no shortness of breath  GI: no n/v/d/c  GU: no urinary retention, no incontinence   Msk: no arthralgias/myalgias, no rigidity, no tremulousness, no abnormal movements   Neuro: no headaches, no blurry vision, no balance issues, no falls    Physical Exam:   There were no vitals filed for this visit.      There is no height or weight on file to calculate BMI.    General:  Healthy-appearing, no acute distress  Neuro:  Self ambulatory, normal gait, no cogwheeling rigidity, no tremors, no abnormal or involuntary movements  Skin: no rashes or lesions noted on exposed  skin      Mental Status Exam:  This is an alert and oriented 62 y.o. female  who appears  stated age, wearing street clothes and well groomed.  she is cooperative, pleasant, and makes good eye contact. No psychomotor abnormalities present. Speech is regular rate and volume, clear and articulate. Mood is ok and affect is consistent and within normal range. Thought process is goal-directed with appropriate thought content. Denies SI/HI. No hallucinations, delusions, or paranoia voiced or suspected. Patient does not appear to be responding to internal stimuli. Attention is adequate for the conversation and she displays a fair ability to think abstractly. Judgement and insight is fair.       Assessment:  This is a 62 y.o. female with a history of depression and PTSD presenting for follow-up.  Patient was  staying with a sister in [place] but recently was able to move into her own apartment in [place] with HUD assistance.  She receives SSI disability.  She lives by herself.  Stable with 40 mg daily of fluoxetine for management of depression.  She has had depression since age 70 with multiple episodes of depression.  She has had SI but no attempts.  One hospitalization.  Stable at this time.  No med changes today.    Diagnosis:  ENCOUNTER DIAGNOSES     ICD-10-CM   1. 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 PTSD (post-traumatic stress disorder) F43.10             Plan:  1. Continue fluoxetine 40 mg daily - she will utilize the tablets without blue dye due to an allergy.    Medication Orders   No Medications ordered       Laboratory Studies:  None    Therapy[facility]     Safety: Low risk for imminent harm to self.    Follow up: Return to clinic in 3 months . Patient advised to call in the interim with questions/concerns and to go to the ER if safety concerns arise.         Jasmine Pang, MD  [facility]  08/09/2018      I saw and examined the patient on 08/09/18.  I reviewed the resident's note.  I agree with the findings and plan of care as documented in the resident's note.  Any exceptions/additions are edited/noted.    Meade Maw, MD

## 2018-08-11 ENCOUNTER — Ambulatory Visit: Payer: MEDICAID | Attending: Family Medicine | Admitting: LICENSED CLINICAL SOCIAL WORKER

## 2018-08-11 ENCOUNTER — Encounter (HOSPITAL_BASED_OUTPATIENT_CLINIC_OR_DEPARTMENT_OTHER): Payer: Self-pay | Admitting: Otolaryngology

## 2018-08-11 ENCOUNTER — Ambulatory Visit (HOSPITAL_BASED_OUTPATIENT_CLINIC_OR_DEPARTMENT_OTHER): Payer: MEDICAID | Admitting: Otolaryngology

## 2018-08-11 VITALS — BP 148/81 | HR 71 | Temp 97.8°F | Resp 18 | Ht 65.0 in | Wt 208.3 lb

## 2018-08-11 DIAGNOSIS — H9201 Otalgia, right ear: Secondary | ICD-10-CM | POA: Insufficient documentation

## 2018-08-11 DIAGNOSIS — E041 Nontoxic single thyroid nodule: Secondary | ICD-10-CM

## 2018-08-11 DIAGNOSIS — Z87891 Personal history of nicotine dependence: Secondary | ICD-10-CM | POA: Insufficient documentation

## 2018-08-11 DIAGNOSIS — Z79899 Other long term (current) drug therapy: Secondary | ICD-10-CM | POA: Insufficient documentation

## 2018-08-11 DIAGNOSIS — Z90710 Acquired absence of both cervix and uterus: Secondary | ICD-10-CM | POA: Insufficient documentation

## 2018-08-11 DIAGNOSIS — Z90721 Acquired absence of ovaries, unilateral: Secondary | ICD-10-CM | POA: Insufficient documentation

## 2018-08-11 DIAGNOSIS — Z7982 Long term (current) use of aspirin: Secondary | ICD-10-CM | POA: Insufficient documentation

## 2018-08-11 DIAGNOSIS — K219 Gastro-esophageal reflux disease without esophagitis: Secondary | ICD-10-CM | POA: Insufficient documentation

## 2018-08-11 DIAGNOSIS — R911 Solitary pulmonary nodule: Secondary | ICD-10-CM | POA: Insufficient documentation

## 2018-08-11 DIAGNOSIS — F331 Major depressive disorder, recurrent, moderate: Secondary | ICD-10-CM

## 2018-08-11 DIAGNOSIS — E042 Nontoxic multinodular goiter: Secondary | ICD-10-CM | POA: Insufficient documentation

## 2018-08-11 DIAGNOSIS — E785 Hyperlipidemia, unspecified: Secondary | ICD-10-CM | POA: Insufficient documentation

## 2018-08-11 DIAGNOSIS — Z85828 Personal history of other malignant neoplasm of skin: Secondary | ICD-10-CM | POA: Insufficient documentation

## 2018-08-11 NOTE — Progress Notes (Signed)
[facility], [facility]  Operated by [address]  [place] [address]  Dept: [phone]  Dept Fax: [phone]     [facility]    PATIENT NAME: [name] NUMBER: [id number]   DATE OF BIRTH: 1956/01/13   DATE OF SERVICE: 08/11/2018  REFERRING PHYSICIAN:  Dr. Stephanie Coup  PROCEDURE: Individual therapy [phone]  TIME IN/OUT:  10am to 10:30 am          TOTAL TIME SPENT: 30 mins      PRIMARY CARE PSYCHOLOGY PROGRESS NOTE    SUBJECTIVE: [name] reported that her mood has been stabile for the last 30 days.  She remains compliant with medication. Patient indicated she has to "fight" her depression daily but can see improvements.  She continues to engage in activities with friends and enjoys living in her own apartment.  She reports she has a good support group and has demonstrated her ability to set boundaries with her friends. Patient reports she is tired from attending multiple doctor appointments. She noted she will need to have a biopsy. She reports she is concerned about a nodule that was found on her lung.  Patient indicated she finds attending bible study has helped her cope with her depression.  Patient indicated the change in seasons has impacted her mood.  Patient reports improved sleep.       OBJECTIVE:   The patient was a well-groomed, polite and cooperative white female. Rapport was easily established and maintained. Affect was broad and spontaneous and congruent with thought content. Speech and thought process were linear and coherent and thought content was devoid of psychotic features. Eye contact was appropriate. Mood was described as "good but tired" . Concentration and memory are within normal limits. Patient displayed good judgment and insight. Suicidal ideation was denied. There was no evidence of mania, hallucinations, delusions, paranoia, obsessions, compulsions, or homicidal ideation.      DIAGNOSTIC IMPRESSION:      Depression; PTSD    PROCEDURES/PLAN:   Praised patient for continuing to successfully manage her depression.  Today's therapy session focused on coping with medical stressors.  Discussed with patient using full spectrum lighting in her apartment to assist her in adjusting to weather changes. Patient to return in 30 days, discussed with patient assessing patient's need to continued therapy at that time. Patient was agreeable to these recommendations and plans to return to clinic in 3 weeks to assess symptom status and progress toward goals.At the next session, we will continue CBT skills. Further treatment is warranted to obtain treatment goals. Prognosis is good.        Samuella Bruin, MSW, [facility]  Operated by [address]  [place]  [place] [address]  Dept: [phone]  Dept Fax: [phone]

## 2018-08-11 NOTE — Progress Notes (Signed)
ENT [facility] [address]  [id number]    Patient Name: [name]   MRN:  [id number]   DOB: 28-Jan-1956   Date of Service:  08/11/2018     ENT HEAD AND NECK ONCOLOGY SURGERY AND RECONSTRUCTION     FOLLOW UP NOTE     HPI: This is a 62 y.o. female who presents with concerns of right-sided otalgia and thyroid nodules.  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  The patient reports sharp intermittent pain in and behind her right ear for the last 2-3 months.  The pain is worse at night.  She was treated for otitis media with Amoxicillin, Augmentin, and Ciprodex drops and did not see any improvement in her symptoms.  She has since been using heating pads and NSAIDS which have been helpful.  She had a recent thyroid US 07/25/18 which showed numerous tiny cysts and nodules bilaterally, and a dominant nodule in the right thyroid gland measuring 1.7 x 0.7 cm.  The right nodule was previously noted to be 1.1 x 0.5 x 1 cm in 2013.  She states this was biopsied in the past and found to be benign.  She reports new dysphagia over the last month, and frequently feels like food is getting stuck.  She does not have any issues with swallowing liquids.  She denies change in voice and difficulty breathing.  Of note, her sister recently underwent hemithyroidectomy for thyroid cancer.  She denies any history of radiation exposure.    Past Medical History:  Past Medical History:   Diagnosis Date   . Arthritis    . Cancer (CMS HCC)     internal skin cancer   . Depression    . Essential hypertension 02/16/2017   . GERD (gastroesophageal reflux disease)     controlled w/med   . Heart murmur 1979    benign   . Hyperlipidemia LDL goal < 100 08/29/2013   . MINOR CAD (coronary artery disease) 11/10/2012    no treatment other than cholesterol medications; follows regularly with Dr. Rodney Booze   . Multinodular goiter    . Rheumatic fever 16    no complications   . Shortness  of breath     on exertion   . Squamous cell carcinoma 02/06/2015    right side of neck   . Vaginal prolapse 2012    surgery improved significantly   . Wears glasses            Past Surgical History:  Past Surgical History:   Procedure Laterality Date   . COLONOSCOPY  01/06/09    COLONOSCOPY performed by Loletta Parish F at [place] [facility]   . GASTROSCOPY  03/05/2010    GASTROSCOPY performed by Jocelyn Lamer at [facility]   . GASTROSCOPY WITH BIOPSY  03/05/2010    GASTROSCOPY WITH BIOPSY performed by Jocelyn Lamer at [facility]   . HX ADENOIDECTOMY     . HX ANKLE FRACTURE [place]  2007    left distal fibula, casted   . HX APPENDECTOMY     . HX CHOLECYSTECTOMY     . HX COLONOSCOPY     . HX CYSTOCELE REPAIR  09/24/09   . HX HAND SURGERY  2012    for Carpal tunnel    . HX OOPHORECTOMY      left ovary removed   . HX TONSILLECTOMY     . HX TOTAL VAGINAL  HYSTERECTOMY  1979   . HX WRIST FRACTURE [place]  2007    [place] injury   . PB REVISE ULNAR NERVE AT ELBOW  1979   . PB UPPER GI ENDOSCOPY,BIOPSY  12/19/07    patulous GE junction zone, erythema, nonerosive GERD           Family History: Patient's family history was reviewed in writing and with the patient with findings as noted below. Unless otherwise noted, patient has no family history of similar problems.   Family Medical History:     Problem Relation (Age of Onset)    Bipolar Disorder Daughter    Breast Cancer Paternal Aunt, Paternal Aunt, Paternal Aunt, Paternal 69, Paternal Aunt, Other    Cancer Paternal Aunt, Paternal 20, Paternal 56, Paternal 67, Paternal 47, Other (43)    Congestive Heart Failure Father (42)    Coronary Artery Disease Father, Mother (56)    Diabetes Mother    Heart Attack Father, Sister    High Cholesterol Mother    Hypertension (High Blood Pressure) Mother, Brother, Sister, Sister    Kidney Disease Sister    Leukemia Paternal Uncle, Paternal Uncle    Stroke Paternal Grandmother    Thyroid Disease Sister               MEDICATIONS:    Current Outpatient  Medications:   .  acetaminophen (TYLENOL) 325 mg Oral Tablet, Take 2 Tabs (650 mg total) by mouth Three times a day, Disp: 180 Tab, Rfl: 0  .  amoxicillin (AMOXIL) 500 mg Oral Capsule, 500 mg Three times a day Take x10 days, Disp: , Rfl: 0  .  aspirin 81 mg Oral Tablet, Chewable, Take 1 Tab (81 mg total) by mouth Once a day, Disp: 30 Tab, Rfl: 11  .  Azelaic Acid 20 % Cream, Apply topically Twice daily, Disp: 50 g, Rfl: 0  .  cholecalciferol, vitamin D3, 1,000 unit (25 mcg) Oral Tablet, Take 1 Tab (1,000 Units total) by mouth Once a day, Disp: 30 Tab, Rfl: 3  .  Clobetasol (TEMOVATE) 0.05 % Ointment, Apply topically Twice daily To rash on hands and feet, Disp: 60 g, Rfl: 0  .  FLUoxetine (SARAFEM) 20 mg Oral Tablet, Take 1 Tab (20 mg total) by mouth Twice daily, Disp: 60 Tab, Rfl: 5  .  gabapentin (NEURONTIN) 300 mg Oral Capsule, Take 2 Caps (600 mg total) by mouth Once a day, Disp: 60 Cap, Rfl: 5  .  Halobetasol Propionate (ULTRAVATE) 0.05 % Cream, Apply  topically. Three times a week, Disp: 15 g, Rfl: 2  .  Metronidazole (METROCREAM) 0.75 % Cream, Apply topically Twice daily, Disp: 45 g, Rfl: 1  .  nitroGLYCERIN (NITROSTAT) 0.4 mg Sublingual Tablet, Sublingual, 1 Tab (0.4 mg total) by Sublingual route Every 5 minutes as needed for Chest pain for up to 3 doses for 3 doses over 15 minutes, Disp: 20 Tab, Rfl: 0  .  nystatin (NYSTOP) 100,000 unit/gram Powder, Apply once or twice daily to PREVENT rash under breasts, Disp: 60 g, Rfl: 11  .  pantoprazole (PROTONIX) 40 mg Oral Tablet, Delayed Release (E.C.), Take 1 Tab (40 mg total) by mouth Every morning before breakfast, Disp: 30 Tab, Rfl: 5  .  tolterodine (DETROL) 2 mg Oral Tablet, Take 1 Tab (2 mg total) by mouth Twice daily, Disp: 30 Tab, Rfl: 11  .  triamcinolone acetonide (ARISTOCORT A) 0.1 % Ointment, by Apply Topically route Twice daily, Disp: 80 g, Rfl: 2  ALLERGIES:  Allergy History as of 08/11/18     CODEINE       Noted Status Severity Type Reaction     12/10/15 0941 Pantojas, Gerster  Active Low  Itching    Comments:  RASH     09/19/13 1430 Wallace Cullens Joy  Deleted Low  Itching    Comments:  RASH     09/04/09 Ginette Pitman  Active   Itching    Comments:  RASH     10/10/07   Active       Comments:  RASH           STRAWBERRY       Noted Status Severity Type Reaction    03/18/11 1634 Johnnette Barrios  Deleted       10/10/08 [name], MD  Deleted       10/10/07   Active             MELOXICAM       Noted Status Severity Type Reaction    12/10/15 0941 Pantojas, Kristy 06/26/09 Active Low  Nausea/ Vomiting    06/26/09 Corky Sing Nacole 06/26/09 Active   Nausea/ Vomiting          NICKEL       Noted Status Severity Type Reaction    07/24/12 1041 [name], MD 07/24/12 Active  Topical           BLUE DYE       Noted Status Severity Type Reaction    07/24/12 1041 [name], MD 07/24/12 Active  Topical     Comments:  Told to avoid due to patch test result by [facility]           DULOXETINE       Noted Status Severity Type Reaction    07/24/12 1052 [name], MD 07/24/12 Active Medium Systemic     Comments:  Rash on feet and hands           NAPROXEN       Noted Status Severity Type Reaction    11/01/12 0913 Sanjuana Letters, MA 11/01/12 Active Low  Nausea/ Vomiting          ATORVASTATIN       Noted Status Severity Type Reaction    06/19/18 2149 Erskine Squibb, RX STUDENT 01/27/15 Active  Side Effect Myalgia    Comments:  Muscle pain     01/27/15 1035 [name], MD 01/27/15 Active  Side Effect     Comments:  Muscle pain           FLAVORING AGENT       Noted Status Severity Type Reaction    04/03/18 1905 Suella Grove, MA 05/03/17 Active             NSAIDS (NON-STEROIDAL ANTI-INFLAMMATORY DRUG)       Noted Status Severity Type Reaction    04/03/18 1905 Suella [place], MA 05/03/17 Active                    SOCIAL HISTORY:  Social History     Socioeconomic History   . Marital status: Divorced     Spouse name: Not  on file   . Number of children: Not on file   . Years of education: Not on file   . Highest education level: Not on file   Occupational History   . 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 Financial resource strain: Not on file   . Food insecurity:     Worry: Not on file     Inability: Not on file   . Transportation needs:     Medical: Not on file     Non-medical: Not on file   Tobacco Use   . Smoking status: Former Smoker     Packs/day: 2.00     Years: 40.00     Pack years: 80.00     Types: Cigarettes     Last attempt to quit: 01/22/2005     Years since quitting: 13.5   . Smokeless tobacco: Never Used   Substance and Sexual Activity   . Alcohol use: No     Alcohol/week: 0.0 standard drinks   . Drug use: No   . Sexual activity: Not on file   Lifestyle   . Physical activity:     Days per week: Not on file     Minutes per session: Not on file   . Stress: Not on file   Relationships   . Social connections:     Talks on phone: Not on file     Gets together: Not on file     Attends religious service: Not on file     Active member of club or organization: Not on file     Attends meetings of clubs or organizations: Not on file     Relationship status: Not on file   . Intimate partner violence:     Fear of current or ex partner: Not on file     Emotionally abused: Not on file     Physically abused: Not on file     Forced sexual activity: Not on file   Other Topics Concern   . Abuse/Domestic Violence Not Asked   . Breast Self Exam Not Asked   . Caffeine Concern Not Asked   . Calcium intake adequate Not Asked   . Computer Use Not Asked   . Exercise Concern Not Asked   . Helmet Use Not Asked   . Seat Belt Not Asked   . Special Diet Not Asked   . Sunscreen used Not Asked   . Uses Cane No   . Uses walker No   . Uses wheelchair No   . Right hand dominant Yes   . Left hand dominant No   . Ambidextrous No   . Ability to Walk 1 Flight of Steps without SOB/CP Yes   .  Routine Exercise Yes     Comment: walk   . Ability to Walk 2 Flight of Steps without SOB/CP Yes   . Unable to Ambulate Not Asked   . Total Care Not Asked   . Ability To Do Own ADL's Yes   . Uses Walker Not Asked   . Other Activity Level Not Asked   . Uses Cane Not Asked   . Drives Not Asked   . Shift Work No   . Unusual Sleep-Wake Schedule No   Social History Narrative   . Not on file       PHYSICAL EXAM:  BP (!) 148/81   Pulse 71   Temp 36.6 C (97.8 F) (Thermal Scan)   Resp 18   Ht 1.651 m (5\' 5" )   Wt 94.5 kg (208 lb 5.4 oz)   BMI 34.67 kg/m       Body mass index is 34.67 kg/m.  General Appearance: Pleasant,  cooperative, healthy, and in no acute distress.  Eyes: Conjunctivae/corneas clear, EOM's intact.  Head and Face: Normocephalic, atraumatic.  Face symmetric, no obvious lesions.   Ears: Normal shape and position. TMs intact, translucent, midposition, middle ear aerated.  Nose:  External pyramid midline. Septum midline.   Oral Cavity/Oropharynx: No mucosal lesions, masses, or pharyngeal asymmetry.  Dentition/Mouth Opening: Edentulous.  >35 mm mouth opening.   Hypopharynx/Larynx: Voice normal.  See scope exam.  Neck: Right neck with well-healed scar.  No palpable thyroid, salivary gland, or neck masses.  No cervical adenopathy.  Cardiovascular: Good perfusion of upper extremities.  No cyanosis of the hands or fingers.  Lungs: No apparent stridorous breathing. No acute distress.  Neurologic: Cranial nerves: grossly intact.  Psychiatric:  Alert and oriented x 3.  LABS:     PATHOLOGY: No results found for this or any previous visit (from the past 720 hour(s)).   IMAGING:   Recent Results (from the past 720 hour(s))   US THYROID     Status: None    Narrative    Alpha  Female, 62 years old.    US THYROID performed on 07/25/2018 4:03 PM.    REASON FOR EXAM:  E04.2: Multinodular goiter    COMPARISON: None    FINDINGS:  THYROID: The right thyroid gland measures 5.7 x 1.8 x 1.8 cm, length,  AP,  transverse. The left thyroid gland measures 5.2 x 1.4 x 1.5 cm., length,  AP, transverse. Vascular flow to the glands are unremarkable. The thyroid  parenchyma is mildly heterogeneous in appearance..    The isthmus measures 2 mm in thickness.    NODULES:  Multiple nodules and cysts are seen in the right thyroid gland  with the smaller cyst and nodules measuring between 2 to 6 mm in size. The  dominant nodule in the right thyroid gland is minimally hypoechoic  measuring 1.7 x 0.7 cm.    In the isthmus is a hypoechoic nodule measuring 0.7 x 0.3 cm. There is a 3  mm hypoechoic nodule in the upper and mid left thyroid gland. 3 mm cyst and  a 3 mm hypoechoic nodule are also seen within the lower thyroid gland.        Impression    1. Thyroid is normal in size with numerous tiny cysts and nodules in the  bilateral glands and isthmus ranging between 2 to 7 mm.  2. Dominant nodule in the right thyroid gland measures 1.7 cm.       MEDIA:  Procedure: Flexible Laryngoscopy, Diagnostic (18563)  Name: Naiomi Musto  DOB: 04/27/1956  Date: 08/11/2018     Attending: Delfino Lovett, MD  Operator: Logan Bores, PA-C  Indications: Due to patient's symptoms and because an indirect mirror examination was inadequate, flexible fiberoptic transnasal laryngoscopy is required to visualize any possible lesions, masses or other abnormalities which may be present in the nasopharynx, hypopharynx and larynx.  After the nasal cavity was anesthetized/decongested with topical lidocaine and oxymetazoline, flexible laryngoscope was passed.     Findings:    Nasopharynx: Normal mucosa and no lesions of the nasopharyngeal walls.  The Eustachian tube orifices are normal.     Oropharynx/Hypopharynx: No lesions of the epiglottis, posterior pharyngeal walls or piriform mucosa.  There is no pooling of secretions.    Larynx: The vocal cords are mobile and adduct to midline bilaterally.  There are no lesions of the aryepiglottic folds, arytenoid mucosa,  interarytenoid mucosa, false vocal folds, or true vocal  folds.  There is good abduction with sniff.  The airway is patent.      The patient tolerated the procedure well.    I personally performed the scope exam.  Delfino Lovett, MD supervised the procedure.    Logan Bores, PA-C      ASSESSMENT/PLAN:   This is a 62 y.o. 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  Advised that her intermittent otalgia may be related to post-surgical scarring or neuroma.  Due to patient's first degree relative with history of thyroid cancer, she would like to proceed with biopsy of the dominant right thyroid nodule.  Will place order for US-guided FNA biopsy.  Will plan to call her with results once available and discuss treatment plan moving forward.  Patient to RTC in the meantime if new concerns arise.    A shared visit was performed with the co-signing physician.    Logan Bores, PA-C  08/11/2018, 10:11    I personally saw and evaluated the patient. See mid-level's note for additional details. My findings/participation are: Patient with prior pleomorphic adenoma resection with clear margins. Also has thyroid nodule that has increased in size in one dimension. Discussed biopsy given family history. This will be scheduled and will call with results.     Delfino Lovett, MD

## 2018-08-11 NOTE — Procedures (Signed)
Procedure: Flexible Laryngoscopy, Diagnostic (84859)  Name: [name]  DOB: 11/01/1955  Date: 08/11/2018     Attending: Delfino Lovett, MD  Operator: Logan Bores, PA-C  Indications: Due to patient's symptoms and because an indirect mirror examination was inadequate, flexible fiberoptic transnasal laryngoscopy is required to visualize any possible lesions, masses or other abnormalities which may be present in the nasopharynx, hypopharynx and larynx.  After the nasal cavity was anesthetized/decongested with topical lidocaine and oxymetazoline, flexible laryngoscope was passed.     Findings:    Nasopharynx: Normal mucosa and no lesions of the nasopharyngeal walls.  The Eustachian tube orifices are normal.     Oropharynx/Hypopharynx: No lesions of the epiglottis, posterior pharyngeal walls or piriform mucosa.  There is no pooling of secretions.    Larynx: The vocal cords are mobile and adduct to midline bilaterally.  There are no lesions of the aryepiglottic folds, arytenoid mucosa, interarytenoid mucosa, false vocal folds, or true vocal folds.  There is good abduction with sniff.  The airway is patent.      The patient tolerated the procedure well.    I personally performed the scope exam.  Delfino Lovett, MD supervised the procedure.    Logan Bores, PA-C

## 2018-08-18 ENCOUNTER — Ambulatory Visit (INDEPENDENT_AMBULATORY_CARE_PROVIDER_SITE_OTHER): Payer: Self-pay | Admitting: Urology

## 2018-08-18 ENCOUNTER — Other Ambulatory Visit (INDEPENDENT_AMBULATORY_CARE_PROVIDER_SITE_OTHER): Payer: Self-pay | Admitting: Urology

## 2018-08-18 NOTE — Nursing Note (Signed)
Katha Cabal, APRN,NP-C  Roselyn Meier, RN            That is fine with me.    Previous Messages      ----- Message -----   From: Roselyn Meier, RN   Sent: 08/18/2018 10:20 AM EDT   To: Roselyn Meier, RN, Katha Cabal, APRN,NP-C     [place],     This patient was changed from Oxybutynin to Detrol due to urinary retention. Her insurance requires she trial Toviaz for 30 days prior to coverage for Detrol. Do you approve a change?     Lattie Haw     ----- Message -----   From: Judd Gaudier   Sent: 08/17/2018  7:40 AM EDT   To: [facility] [place] R[facility]             Message from Judd Gaudier sent at 08/17/2018 7:40 AM EDT     [facility] called on behalf of a prior authorization for this "F" patient for generic Detrol, tolteridine 2 mg tablet, takes 2 x day. She left a VM message to call back.   Call History      Type Contact Phone User   08/17/2018 07:38 AM Phone (Incoming) Doroteo Bradford (w/[facility]) [phone] Judd Gaudier         Prescription prepped and routed to [place] for review and signature.    Roselyn Meier, RN  08/18/2018, 11:45

## 2018-08-18 NOTE — Nursing Note (Signed)
Roselyn Meier, RN  [place], [place], APRN,NP-C Cc: Roselyn Meier, RN   Caller: Unspecified (Today, 11:41 AM)            That is very interesting. I will take care of it.     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   ----- Message -----   From: Roselyn Meier, RN   Sent: 08/18/2018  2:26 PM EDT   To: Katha Cabal, APRN,NP-C     ----- Message from Roselyn Meier, RN sent at 08/18/2018 2:26 PM EDT -----   [place],   Please try to run again.   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   ----- Message -----   From: Roselyn Meier, RN   Sent: 08/18/2018 11:43 AM EDT   To: Katha Cabal, APRN,NP-C     ----- Message from Roselyn Meier, RN sent at 08/18/2018 11:43 AM EDT -----   Marye Round,   Please review and alter if needed.   Thanks, Lattie Haw            I spoke with Gerald Stabs at [facility] at [phone] to initiate prior authorization with above information.  They request documentation of blue dye allergy to be faxed to them at [phone].    Roselyn Meier, RN  08/18/2018, 15:09

## 2018-08-18 NOTE — Nursing Note (Signed)
Prior authorization approved for Detrol for 08/18/2018 - 08/12/2019.  Erika from [place] was notified.    Roselyn Meier, RN  08/18/2018, 17:52

## 2018-08-21 ENCOUNTER — Other Ambulatory Visit (HOSPITAL_BASED_OUTPATIENT_CLINIC_OR_DEPARTMENT_OTHER): Payer: Self-pay | Admitting: Family Medicine

## 2018-08-21 MED ORDER — PANTOPRAZOLE 40 MG TABLET,DELAYED RELEASE
40.00 mg | DELAYED_RELEASE_TABLET | Freq: Every morning | ORAL | 5 refills | Status: DC
Start: 2018-08-21 — End: 2019-02-26

## 2018-08-31 ENCOUNTER — Other Ambulatory Visit (HOSPITAL_COMMUNITY): Payer: Self-pay | Admitting: Diagnostic Radiology

## 2018-08-31 ENCOUNTER — Ambulatory Visit
Admission: RE | Admit: 2018-08-31 | Discharge: 2018-08-31 | Disposition: A | Discharge status: Home / Self Care | Payer: MEDICAID | Source: Ambulatory Visit | Attending: Diagnostic Radiology | Admitting: Diagnostic Radiology

## 2018-08-31 DIAGNOSIS — R896 Abnormal cytological findings in specimens from other organs, systems and tissues: Secondary | ICD-10-CM

## 2018-08-31 DIAGNOSIS — E041 Nontoxic single thyroid nodule: Secondary | ICD-10-CM

## 2018-09-06 LAB — HISTORICAL CYTOPATHOLOGY-FINE NEEDLE ASPIRATE

## 2018-09-08 ENCOUNTER — Ambulatory Visit (HOSPITAL_BASED_OUTPATIENT_CLINIC_OR_DEPARTMENT_OTHER): Payer: Self-pay | Admitting: LICENSED CLINICAL SOCIAL WORKER

## 2018-09-08 ENCOUNTER — Ambulatory Visit (HOSPITAL_BASED_OUTPATIENT_CLINIC_OR_DEPARTMENT_OTHER): Payer: MEDICAID | Admitting: LICENSED CLINICAL SOCIAL WORKER

## 2018-09-09 ENCOUNTER — Telehealth (HOSPITAL_BASED_OUTPATIENT_CLINIC_OR_DEPARTMENT_OTHER): Payer: Self-pay | Admitting: Otolaryngology

## 2018-09-09 NOTE — Telephone Encounter (Signed)
Called patient about thyroid biopsy. 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 We discussed diagnostic hemithyroidectomy versus waiting for Afirma. Will call patient back when Afirma is back.

## 2018-09-11 ENCOUNTER — Ambulatory Visit: Payer: MEDICAID | Attending: Family Medicine | Admitting: LICENSED CLINICAL SOCIAL WORKER

## 2018-09-11 DIAGNOSIS — Z87891 Personal history of nicotine dependence: Secondary | ICD-10-CM | POA: Insufficient documentation

## 2018-09-11 DIAGNOSIS — Z79899 Other long term (current) drug therapy: Secondary | ICD-10-CM | POA: Insufficient documentation

## 2018-09-11 DIAGNOSIS — F331 Major depressive disorder, recurrent, moderate: Secondary | ICD-10-CM | POA: Insufficient documentation

## 2018-09-13 NOTE — Progress Notes (Signed)
[facility], [facility]  Operated by [facility]  [place]  [place] [address]  Dept: [phone]  Dept Fax: [phone]     [facility]    PATIENT NAME: [name] NUMBER: [id number]   DATE OF BIRTH: [birth year]   DATE OF SERVICE: 09/11/2018  REFERRING PHYSICIAN:  Dr. Stephanie Coup  PROCEDURE: Individual therapy [phone]  TIME IN/OUT:  2:45 pm to 3:24 pm          TOTAL TIME SPENT: 39 minutes      PRIMARY CARE PSYCHOLOGY PROGRESS NOTE    SUBJECTIVE: [name] reported that her mood has been "ok".  She is stressed as she feels she may have thyroid cancer. She reported that her Dr informed her she has suspicious cells. 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  She reported she continues to have issues with her family. Patient continues to take her medication.      OBJECTIVE:   The patient was a well-groomed, polite and cooperative white female. Rapport was easily established and maintained. Affect was broad and spontaneous and congruent with thought content. Speech and thought process were linear and coherent and thought content was devoid of psychotic features. Eye contact was appropriate. Mood was described as "OK". Concentration and memory are within normal limits. Patient displayed good judgment and insight. Suicidal ideation was denied. There was no evidence of mania, hallucinations, delusions, paranoia, obsessions, compulsions, or homicidal ideation.      DIAGNOSTIC IMPRESSION:   Major Depression Disorder    PROCEDURES/PLAN:   Today's session assisted patient on focusing on positives in her life. Utilized prompts to allow patient to identify personal  achievements. Patient's mood has been stable for period of 60 days. Patient will continue to reflect on pattern of stable behavior.  Patient was agreeable to these recommendations and plans to return to clinic in 3 weeks to  assess symptom status and progress toward goals.At the next session, we will continue positive therapy. Further treatment is warranted to obtain treatment goals. Prognosis is good.        Samuella Bruin, MSW, [place]  [facility]ated [facility]  [place]  [place] [address]  Dept: [phone]  Dept Fax: [phone]

## 2018-09-26 ENCOUNTER — Encounter (HOSPITAL_BASED_OUTPATIENT_CLINIC_OR_DEPARTMENT_OTHER): Payer: Self-pay | Admitting: Otolaryngology

## 2018-09-27 ENCOUNTER — Telehealth (HOSPITAL_BASED_OUTPATIENT_CLINIC_OR_DEPARTMENT_OTHER): Payer: Self-pay | Admitting: Otolaryngology

## 2018-09-27 NOTE — Telephone Encounter (Signed)
Called patient with Afirma results. Discussed that this was benign, though FNA was AUS. We discussed options of observation versus malignancy. The patient does have a sister with a history of thyroid cancer. I was clear that the most definitive diagnosis would be made with a hemithyroidectomy. Will set up appointment for patient to discuss surgery in early 2020.

## 2018-09-29 ENCOUNTER — Emergency Department: Admission: EM | Admit: 2018-09-29 | Discharge: 2018-09-29 | Discharge status: Against Medical Advice | Payer: MEDICAID

## 2018-09-29 DIAGNOSIS — Z5321 Procedure and treatment not carried out due to patient leaving prior to being seen by health care provider: Secondary | ICD-10-CM | POA: Insufficient documentation

## 2018-10-24 ENCOUNTER — Encounter (HOSPITAL_BASED_OUTPATIENT_CLINIC_OR_DEPARTMENT_OTHER): Payer: Self-pay | Admitting: Family Medicine

## 2018-10-24 ENCOUNTER — Ambulatory Visit: Payer: MEDICAID | Attending: Family Medicine | Admitting: Family Medicine

## 2018-10-24 VITALS — BP 130/80 | HR 88 | Temp 97.9°F | Resp 18 | Ht 65.0 in | Wt 209.9 lb

## 2018-10-24 DIAGNOSIS — M7671 Peroneal tendinitis, right leg: Principal | ICD-10-CM | POA: Insufficient documentation

## 2018-10-24 MED ORDER — DICLOFENAC SODIUM 75 MG TABLET,DELAYED RELEASE
75.00 mg | DELAYED_RELEASE_TABLET | Freq: Two times a day (BID) | ORAL | 0 refills | Status: DC
Start: 2018-10-24 — End: 2018-11-15

## 2018-10-24 NOTE — Nursing Note (Signed)
Patient D/C from clinic at 1345 and Refused a WC. I assisted patient to the elevator and she insisted to go from there and that her best friend was waiting outside for her.   [name], LPN

## 2018-10-24 NOTE — Progress Notes (Signed)
Subjective:    [name] is a 62 y.o. female who presents for   Chief Complaint   Patient presents with   . Foot Pain     x 4 days right ankle pain/ edema       [name] is a 62 year old female who presents for evaluation of right foot pain.  PCP: Dr. Malena Peer  PMH: HTN, HLD, GERD, Hx rheumatic fever, MDD, Migraines, CAD, Multinodular goiter, Pulmonary nodules, Neuropathy, OA, PTSD, Hx squamous cell carcinoma    Right ankle pain  -Symptom onset 3-4 days ago  -No preceding injury  -First noticed when doing housework at home.   -Worsening  -Constant  -Wakes her from sleep when she moves her foot   -Sharp, shoots up to toes and up lateral right leg  -Worse with plantar flexion   -Hx of broken bone in right ankle many years ago  -Feels swollen.   -Not warm or red  -No paresthesias or weakness in foot  -Has taken Tylenol and Advil. Dulls the pain but does not alleviate it.  -Has applied ice. Helps for a short time.   -Endorses history of stomach ulcer from overuse of Motrin many years ago. No further issues since then with periodic use of NSAIDs. No renal dysfunction.       Interval past medical, surgical, family and social history reviewed and updated in chart.     acetaminophen (TYLENOL) 325 mg Oral Tablet, Take 2 Tabs (650 mg total) by mouth Three times a day  amoxicillin (AMOXIL) 500 mg Oral Capsule, 500 mg Three times a day Take x10 days  aspirin 81 mg Oral Tablet, Chewable, Take 1 Tab (81 mg total) by mouth Once a day  Azelaic Acid 20 % Cream, Apply topically Twice daily  cholecalciferol, vitamin D3, 1,000 unit (25 mcg) Oral Tablet, Take 1 Tab (1,000 Units total) by mouth Once a day  Clobetasol (TEMOVATE) 0.05 % Ointment, Apply topically Twice daily To rash on hands and feet  FLUoxetine (SARAFEM) 20 mg Oral Tablet, Take 1 Tab (20 mg total) by mouth Twice daily  gabapentin (NEURONTIN) 300 mg Oral Capsule, Take 2 Caps (600 mg total) by mouth Once a day  Halobetasol Propionate (ULTRAVATE) 0.05 % Cream,  Apply  topically. Three times a week  Metronidazole (METROCREAM) 0.75 % Cream, Apply topically Twice daily  nitroGLYCERIN (NITROSTAT) 0.4 mg Sublingual Tablet, Sublingual, 1 Tab (0.4 mg total) by Sublingual route Every 5 minutes as needed for Chest pain for up to 3 doses for 3 doses over 15 minutes  nystatin (NYSTOP) 100,000 unit/gram Powder, Apply once or twice daily to PREVENT rash under breasts  pantoprazole (PROTONIX) 40 mg Oral Tablet, Delayed Release (E.C.), Take 1 Tab (40 mg total) by mouth Every morning before breakfast  tolterodine (DETROL) 2 mg Oral Tablet, Take 2 mg by mouth Once a day  triamcinolone acetonide (ARISTOCORT A) 0.1 % Ointment, by Apply Topically route Twice daily    No facility-administered medications prior to visit.       The patient's medications were reviewed and updated at the end of the clinic visit today.    Allergies   Allergen Reactions   . Cymbalta [Duloxetine]      Rash on feet and hands   . Blue Dye      Told to avoid due to patch test result by Simone Curia   . Flavoring Agent    . Lipitor [Atorvastatin] Myalgia     Muscle pain   .  Nickel    . Nsaids (Non-Steroidal Anti-Inflammatory Drug)    . Codeine Itching     RASH   . Mobic [Meloxicam] Nausea/ Vomiting   . Naprosyn [Naproxen] Nausea/ Vomiting       ROS:   Skin: No erythema or warmth of right ankle  Neurologic: No paresthesias or weakness or right foot  Musculoskeletal: + right ankle pain    Objective:  BP 130/80 (Site: Left, Patient Position: Sitting, Cuff Size: Adult Large)   Pulse 88   Temp 36.6 C (97.9 F) (Thermal Scan)   Resp 18   Ht 1.651 m (5\' 5" )   Wt 95.2 kg (209 lb 14.1 oz)   SpO2 96%   BMI 34.93 kg/m   Physical Exam:  General: Well appearing female. In acute pain with movement of right ankle  Psychiatric: Normal affect and behavior.  Neurologic: Cranial nerves II-XII grossly intact.  Skin: Warm and dry   HEENT: Normocephalic, atraumatic.   Musculoskeletal:   Right foot: Skin intact with no gross deformity.  Mild edema to lateral ankle. No erythema or warmth. Peripheral pulses intact. Sensation to light touch intact. AROM and strength intact with plantar and dorsiflexion, eversion and inversion. Pain elicited with plantar flexion and eversion of foot. Pain with palpation of lateral ankle posterior to lateral malleolus as well as along lateral side of foot.      Assessment and Plan:    [name] is a 62 y.o. female here for:      ICD-10-CM    1. Peroneal tendinitis of right lower extremity M76.71 diclofenac sodium (VOLTAREN) 75 mg Oral Tablet, Delayed Release (E.C.)     1. Peroneal tendinitis of right lower extremity  -Start Dicolfenac 75 mg bid. Patient instructed to take scheduled for 1 week then prn. Medication counseling provided including risk of GI irritation/bleed; patient advised to take with food. Patient advised to avoid other NSAIDs. Patient to continue home Protonix which will provide additional protection against gastric irritation.  -Recommended application of ice for 15 minutes at least three times daily  -Recommended ankle taping which was demonstrated for patient in clinic today. Alternatively recommended ankle sleeve if she prefers.   -Handout provided on eccentric exercises including isometric contraction against wall with everted foot and heel drop and raise on step.   -Return to clinic in 6 weeks   - diclofenac sodium (VOLTAREN) 75 mg Oral Tablet, Delayed Release (E.C.); Take 1 Tab (75 mg total) by mouth Twice daily Take 1 tablet twice daily for 1 week then as needed up to twice daily  Dispense: 30 Tab; Refill: 0      Return in about 6 weeks (around 12/05/2018) for follow up peroneal tendonitis.      Cassandria Anger, MD  [place]  [facility]  [place]  [place] [address]  Dept: [phone]  Dept Fax: [phone]     I discussed the patient's care with the Resident prior to the patient leaving the clinic. Any  significant discussion points are noted .    Konrad Saha, MD

## 2018-10-26 NOTE — Addendum Note (Signed)
Addended by: Cassandria Anger on: 10/26/2018 12:08 PM     Modules accepted: Level of Service

## 2018-11-01 ENCOUNTER — Encounter (HOSPITAL_BASED_OUTPATIENT_CLINIC_OR_DEPARTMENT_OTHER): Payer: Self-pay | Admitting: Otolaryngology

## 2018-11-01 ENCOUNTER — Ambulatory Visit: Payer: MEDICAID | Attending: Otolaryngology | Admitting: Otolaryngology

## 2018-11-01 VITALS — BP 131/71 | HR 80 | Temp 97.8°F | Resp 18 | Ht 65.0 in | Wt 210.5 lb

## 2018-11-01 DIAGNOSIS — Z79899 Other long term (current) drug therapy: Secondary | ICD-10-CM | POA: Insufficient documentation

## 2018-11-01 DIAGNOSIS — Z87891 Personal history of nicotine dependence: Secondary | ICD-10-CM | POA: Insufficient documentation

## 2018-11-01 DIAGNOSIS — Z09 Encounter for follow-up examination after completed treatment for conditions other than malignant neoplasm: Secondary | ICD-10-CM | POA: Insufficient documentation

## 2018-11-01 DIAGNOSIS — Z90721 Acquired absence of ovaries, unilateral: Secondary | ICD-10-CM | POA: Insufficient documentation

## 2018-11-01 DIAGNOSIS — E041 Nontoxic single thyroid nodule: Secondary | ICD-10-CM | POA: Insufficient documentation

## 2018-11-01 DIAGNOSIS — Z853 Personal history of malignant neoplasm of breast: Secondary | ICD-10-CM | POA: Insufficient documentation

## 2018-11-01 DIAGNOSIS — K219 Gastro-esophageal reflux disease without esophagitis: Secondary | ICD-10-CM | POA: Insufficient documentation

## 2018-11-01 DIAGNOSIS — E785 Hyperlipidemia, unspecified: Secondary | ICD-10-CM | POA: Insufficient documentation

## 2018-11-01 DIAGNOSIS — Z800 Family history of malignant neoplasm of digestive organs: Secondary | ICD-10-CM | POA: Insufficient documentation

## 2018-11-01 DIAGNOSIS — Z90710 Acquired absence of both cervix and uterus: Secondary | ICD-10-CM | POA: Insufficient documentation

## 2018-11-01 DIAGNOSIS — Z7982 Long term (current) use of aspirin: Secondary | ICD-10-CM | POA: Insufficient documentation

## 2018-11-01 NOTE — Progress Notes (Signed)
ENT [place] [address]  [phone]    Patient Name: [name]   MRN:  [id number]   DOB: [birth year]   Date of Service:  11/01/2018     ENT HEAD AND NECK ONCOLOGY SURGERY AND RECONSTRUCTION     FOLLOW UP NOTE     HPI: This is a 63 y.o. female who presents for follow-up for a thyroid nodule. She comes in today for followup of a thyroid nodule.  This has been stable on the right side and measured about 1.7 cm.  She had a recent biopsy which showed atypical cells of uncertain significance Afirma was benign.  She has a sister who had a history of thyroid cancer.  She comes in today to discuss observation versus surgery.  No recent changes.  She feels like her voice is intermittently hoarse.  Her scope exam on her last visit was normal.      Past Medical History:  Past Medical History:   Diagnosis Date   . Arthritis    . Cancer (CMS HCC)     internal skin cancer   . Depression    . Essential hypertension 02/16/2017   . GERD (gastroesophageal reflux disease)     controlled w/med   . Heart murmur 1979    benign   . Hyperlipidemia LDL goal < 100 08/29/2013   . MINOR CAD (coronary artery disease) 11/10/2012    no treatment other than cholesterol medications; follows regularly with Dr. Rodney Booze   . Multinodular goiter    . Rheumatic fever 16    no complications   . Shortness of breath     on exertion   . Squamous cell carcinoma 02/06/2015    right side of neck   . Vaginal prolapse 2012    surgery improved significantly   . Wears glasses            Past Surgical History:  Past Surgical History:   Procedure Laterality Date   . COLONOSCOPY  01/06/09    COLONOSCOPY performed by Loletta Parish F at [facility]   . GASTROSCOPY  03/05/2010    GASTROSCOPY performed by Jocelyn Lamer at [facility]   . GASTROSCOPY WITH BIOPSY  03/05/2010    GASTROSCOPY WITH BIOPSY performed by Jocelyn Lamer at [facility]   . HX ADENOIDECTOMY     . HX ANKLE FRACTURE [place]  2007    left distal fibula, casted   . HX  APPENDECTOMY     . HX CHOLECYSTECTOMY     . HX COLONOSCOPY     . HX CYSTOCELE REPAIR  09/24/09   . HX HAND SURGERY  2012    for Carpal tunnel    . HX OOPHORECTOMY      left ovary removed   . HX TONSILLECTOMY     . HX TOTAL VAGINAL HYSTERECTOMY  1979   . HX WRIST FRACTURE [place]  2007    [place] injury   . PB REVISE ULNAR NERVE AT ELBOW  1979   . PB UPPER GI ENDOSCOPY,BIOPSY  12/19/07    patulous GE junction zone, erythema, nonerosive GERD           Family History: Patient's family history was reviewed in writing and with the patient with findings as noted below. Unless otherwise noted, patient has no family history of similar problems.   Family Medical History:     Problem Relation (Age of Onset)    Bipolar  Disorder Daughter    Breast Cancer Paternal Aunt, Paternal 85, Paternal Aunt, Paternal Aunt, Paternal Aunt, Other    Cancer Paternal Aunt, Paternal 1, Paternal 68, Paternal 70, Paternal 59, Other (6)    Congestive Heart Failure Father (33)    Coronary Artery Disease Father, Mother (15)    Diabetes Mother    Heart Attack Father, Sister    High Cholesterol Mother    Hypertension (High Blood Pressure) Mother, Brother, Sister, Sister    Kidney Disease Sister    Leukemia Paternal Uncle, Paternal Uncle    Stroke Paternal Grandmother    Thyroid Disease Sister               MEDICATIONS:    Current Outpatient Medications:   .  acetaminophen (TYLENOL) 325 mg Oral Tablet, Take 2 Tabs (650 mg total) by mouth Three times a day, Disp: 180 Tab, Rfl: 0  .  amoxicillin (AMOXIL) 500 mg Oral Capsule, 500 mg Three times a day Take x10 days, Disp: , Rfl: 0  .  aspirin 81 mg Oral Tablet, Chewable, Take 1 Tab (81 mg total) by mouth Once a day, Disp: 30 Tab, Rfl: 11  .  Azelaic Acid 20 % Cream, Apply topically Twice daily, Disp: 50 g, Rfl: 0  .  cholecalciferol, vitamin D3, 1,000 unit (25 mcg) Oral Tablet, Take 1 Tab (1,000 Units total) by mouth Once a day, Disp: 30 Tab, Rfl: 3  .  Clobetasol (TEMOVATE) 0.05 % Ointment, Apply  topically Twice daily To rash on hands and feet, Disp: 60 g, Rfl: 0  .  diclofenac sodium (VOLTAREN) 75 mg Oral Tablet, Delayed Release (E.C.), Take 1 Tab (75 mg total) by mouth Twice daily Take 1 tablet twice daily for 1 week then as needed up to twice daily, Disp: 30 Tab, Rfl: 0  .  FLUoxetine (SARAFEM) 20 mg Oral Tablet, Take 1 Tab (20 mg total) by mouth Twice daily, Disp: 60 Tab, Rfl: 5  .  gabapentin (NEURONTIN) 300 mg Oral Capsule, Take 2 Caps (600 mg total) by mouth Once a day, Disp: 60 Cap, Rfl: 5  .  Halobetasol Propionate (ULTRAVATE) 0.05 % Cream, Apply  topically. Three times a week, Disp: 15 g, Rfl: 2  .  Metronidazole (METROCREAM) 0.75 % Cream, Apply topically Twice daily, Disp: 45 g, Rfl: 1  .  nitroGLYCERIN (NITROSTAT) 0.4 mg Sublingual Tablet, Sublingual, 1 Tab (0.4 mg total) by Sublingual route Every 5 minutes as needed for Chest pain for up to 3 doses for 3 doses over 15 minutes, Disp: 20 Tab, Rfl: 0  .  nystatin (NYSTOP) 100,000 unit/gram Powder, Apply once or twice daily to PREVENT rash under breasts, Disp: 60 g, Rfl: 11  .  pantoprazole (PROTONIX) 40 mg Oral Tablet, Delayed Release (E.C.), Take 1 Tab (40 mg total) by mouth Every morning before breakfast, Disp: 30 Tab, Rfl: 5  .  tolterodine (DETROL) 2 mg Oral Tablet, Take 2 mg by mouth Once a day, Disp: , Rfl:   .  triamcinolone acetonide (ARISTOCORT A) 0.1 % Ointment, by Apply Topically route Twice daily, Disp: 80 g, Rfl: 2     ALLERGIES:  Allergy History as of 11/01/18     CODEINE       Noted Status Severity Type Reaction    12/10/15 0941 Pantojas, [place]  Active Low  Itching    Comments:  RASH     09/19/13 1430 Wallace Cullens Joy  Deleted Low  Itching    Comments:  RASH     09/04/09 Ginette Pitman  Active   Itching    Comments:  RASH     10/10/07   Active       Comments:  RASH           STRAWBERRY       Noted Status Severity Type Reaction    03/18/11 1634 Johnnette Barrios  Deleted       10/10/08 [name], MD  Deleted       10/10/07    Active             MELOXICAM       Noted Status Severity Type Reaction    12/10/15 0941 Pantojas, Kristy 06/26/09 Active Low  Nausea/ Vomiting    06/26/09 Corky Sing N[place] 06/26/09 Active   Nausea/ Vomiting          NICKEL       Noted Status Severity Type Reaction    07/24/12 1041 [name], MD 07/24/12 Active  Topical           BLUE DYE       Noted Status Severity Type Reaction    07/24/12 1041 [name], MD 07/24/12 Active  Topical     Comments:  Told to avoid due to patch test result by Derm            DULOXETINE       Noted Status Severity Type Reaction    07/24/12 1052 [name], MD 07/24/12 Active Medium Systemic     Comments:  Rash on feet and hands           NAPROXEN       Noted Status Severity Type Reaction    11/01/12 0913 Sanjuana Letters, MA 11/01/12 Active Low  Nausea/ Vomiting          ATORVASTATIN       Noted Status Severity Type Reaction    06/19/18 2149 Erskine Squibb, RX STUDENT 01/27/15 Active  Side Effect Myalgia    Comments:  Muscle pain     01/27/15 1035 [name], MD 01/27/15 Active  Side Effect     Comments:  Muscle pain           FLAVORING AGENT       Noted Status Severity Type Reaction    04/03/18 1905 Suella [place], MA 05/03/17 Active             NSAIDS (NON-STEROIDAL ANTI-INFLAMMATORY DRUG)       Noted Status Severity Type Reaction    04/03/18 1905 Suella [place], MA 05/03/17 Active                    SOCIAL HISTORY:  Social History     Socioeconomic History   . Marital status: Divorced     Spouse name: Not on file   . Number of children: Not on file   . Years of education: Not on file   . Highest education level: Not on file   Occupational History   . 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 Financial resource strain: Not on file   . Food insecurity:     Worry: Not on file     Inability: Not on file   . Transportation needs:     Medical: Not on file      Non-medical: Not on file  Tobacco Use   . Smoking status: Former Smoker     Packs/day: 2.00     Years: 40.00     Pack years: 80.00     Types: Cigarettes     Last attempt to quit: 01/22/2005     Years since quitting: 13.7   . Smokeless tobacco: Never Used   Substance and Sexual Activity   . Alcohol use: No     Alcohol/week: 0.0 standard drinks   . Drug use: No   . Sexual activity: Not on file   Lifestyle   . Physical activity:     Days per week: Not on file     Minutes per session: Not on file   . Stress: Not on file   Relationships   . Social connections:     Talks on phone: Not on file     Gets together: Not on file     Attends religious service: Not on file     Active member of club or organization: Not on file     Attends meetings of clubs or organizations: Not on file     Relationship status: Not on file   . Intimate partner violence:     Fear of current or ex partner: Not on file     Emotionally abused: Not on file     Physically abused: Not on file     Forced sexual activity: Not on file   Other Topics Concern   . Abuse/Domestic Violence Not Asked   . Breast Self Exam Not Asked   . Caffeine Concern Not Asked   . Calcium intake adequate Not Asked   . Computer Use Not Asked   . Exercise Concern Not Asked   . Helmet Use Not Asked   . Seat Belt Not Asked   . Special Diet Not Asked   . Sunscreen used Not Asked   . Uses Cane No   . Uses walker No   . Uses wheelchair No   . Right hand dominant Yes   . Left hand dominant No   . Ambidextrous No   . Ability to Walk 1 Flight of Steps without SOB/CP Yes   . Routine Exercise Yes     Comment: walk   . Ability to Walk 2 Flight of Steps without SOB/CP Yes   . Unable to Ambulate Not Asked   . Total Care Not Asked   . Ability To Do Own ADL's Yes   . Uses Walker Not Asked   . Other Activity Level Not Asked   . Uses Cane Not Asked   . Drives Not Asked   . Shift Work No   . Unusual Sleep-Wake Schedule No   Social History Narrative   . Not on file         PHYSICAL EXAM:  BP 131/71    Pulse 80   Temp 36.6 C (97.8 F) (Thermal Scan)   Resp 18   Ht 1.651 m (5\' 5" )   Wt 95.5 kg (210 lb 8.6 oz)   BMI 35.04 kg/m       Body mass index is 35.04 kg/m.  General Appearance: Pleasant, cooperative, healthy, and in no acute distress.  Eyes: Conjunctivae/corneas clear, EOM's intact.  Head and Face: Normocephalic, atraumatic.  Face symmetric, no obvious lesions.   Ears: Normal shape and position.   Nose:  External pyramid midline. Septum midline.   Oral Cavity/Oropharynx: No mucosal lesions, masses, or pharyngeal asymmetry.  Neck:  No palpable thyroid,  salivary gland, or neck masses. No cervical adenopathy.  Cardiovascular:  Good perfusion of upper extremities.  No cyanosis of the hands or fingers.  Lungs: No apparent stridorous breathing. No acute distress.  Neurologic: Cranial nerves: grossly intact.  Psychiatric:  Alert and oriented x 3.      ASSESSMENT/PLAN: The patient comes in today with a history of thyroid nodule, as well as a sister with a history of thyroid cancer.  Her nodule showed atypical cells of uncertain significance at this turn.  I discussed the risks and benefits of hemithyroidectomy versus observation.  I did discuss that given the pathology findings as well as her family history of thyroid cancer, hemithyroidectomy is certainly reasonable.  I went over the risks of pain, bleeding, recurrent laryngeal nerve injury, and hypothyroidism.  The patient understood.  She would like to think about it.  I will catch up with her in a week and see where she is at with this.        Delfino Lovett, MD

## 2018-11-06 ENCOUNTER — Ambulatory Visit
Admission: RE | Admit: 2018-11-06 | Discharge: 2018-11-06 | Disposition: A | Discharge status: Home / Self Care | Payer: MEDICAID | Source: Ambulatory Visit | Attending: Family Medicine | Admitting: Family Medicine

## 2018-11-06 ENCOUNTER — Ambulatory Visit (HOSPITAL_BASED_OUTPATIENT_CLINIC_OR_DEPARTMENT_OTHER): Payer: MEDICAID | Admitting: LICENSED CLINICAL SOCIAL WORKER

## 2018-11-06 DIAGNOSIS — I2510 Atherosclerotic heart disease of native coronary artery without angina pectoris: Secondary | ICD-10-CM | POA: Insufficient documentation

## 2018-11-06 DIAGNOSIS — F331 Major depressive disorder, recurrent, moderate: Secondary | ICD-10-CM

## 2018-11-06 DIAGNOSIS — R918 Other nonspecific abnormal finding of lung field: Secondary | ICD-10-CM | POA: Insufficient documentation

## 2018-11-06 DIAGNOSIS — E041 Nontoxic single thyroid nodule: Secondary | ICD-10-CM

## 2018-11-06 DIAGNOSIS — R911 Solitary pulmonary nodule: Secondary | ICD-10-CM

## 2018-11-06 DIAGNOSIS — IMO0001 Reserved for inherently not codable concepts without codable children: Secondary | ICD-10-CM

## 2018-11-09 NOTE — Progress Notes (Signed)
FAMILY [facility]IC[facility], [facility]  Operated by [facility]  [place]  [place] [address]  Dept: [phone]  Dept Fax: [phone]     [facility] MED    PATIENT NAME: [name]   CHART NUMBER: [id number]   DATE OF BIRTH: 1956-03-21   DATE OF SERVICE: 11/06/2018  REFERRING PHYSICIAN:  Dr. Stephanie Coup  PROCEDURE: Individual therapy [phone]  TIME IN/OUT: 1:09pm to 1:33 pm           TOTAL TIME SPENT: 23 minutes      PRIMARY CARE PSYCHOLOGY PROGRESS NOTE    SUBJECTIVE: [name] reported that her mood continues to be stable.  Patient reports she is feeling tired but contributes her feels to issues with her thyroid. Patient noted she has reestablished positive relationships with her daughters.  She feels that they have been supportive of her.  Her involvement with her family has increased, patient feels less isolated. She continues her involvement with church activities and friends.  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 Patient noted she continues to have financial concerns but has been able to cope with the stress.       OBJECTIVE:   The patient was a well-groomed, polite and cooperative white female. Rapport was easily established and maintained. Affect was broad and spontaneous and congruent with thought content. Speech and thought process were linear and coherent and thought content was devoid of psychotic features. Eye contact was appropriate. Mood was described as "good". Concentration and memory are within normal limits. Patient displayed good judgment and insight. Suicidal ideation was denied. There was no evidence of mania, hallucinations, delusions, paranoia, obsessions, compulsions, or homicidal ideation.      DIAGNOSTIC IMPRESSION:     PROCEDURES/PLAN:   Today's session focused on reviewing patient's progress.[name] is satisfied with his current level of treatment success and as such it was mutually agreed to discontinue treatment at this time. Further  treatment is no longer warranted but is available in the future if desired. Prognosis is good. Patient was agreeable to these recommendations . Patient will return PRN.      [name], MSW, [employer]  Operated by [facility]  [place]  [place] [address]  Dept: [phone]  Dept Fax: [phone]

## 2018-11-10 ENCOUNTER — Ambulatory Visit (INDEPENDENT_AMBULATORY_CARE_PROVIDER_SITE_OTHER): Payer: MEDICAID | Admitting: Psychiatry

## 2018-11-10 DIAGNOSIS — F331 Major depressive disorder, recurrent, moderate: Secondary | ICD-10-CM

## 2018-11-10 DIAGNOSIS — F4310 Post-traumatic stress disorder, unspecified: Secondary | ICD-10-CM

## 2018-11-10 MED ORDER — TRAZODONE 50 MG TABLET
ORAL_TABLET | ORAL | 3 refills | Status: DC
Start: 2018-11-10 — End: 2020-09-30

## 2018-11-10 NOTE — Progress Notes (Signed)
[facility]  Department of Behavioral Medicine and Psychiatry    Outpatient Progress Note      [name]   [id number]  1956/03/06    DOS: 11/10/2018    Chief complaint:   Chief Complaint   Patient presents with   . Sleep Problem        ID:  [name] is a 63 y.o. female who  has a past medical history of Arthritis, Cancer (CMS [place]), Depression, Essential hypertension (02/16/2017), GERD (gastroesophageal reflux disease), Heart murmur (1979), Hyperlipidemia LDL goal < 100 (08/29/2013), MINOR CAD (coronary artery disease) (11/10/2012), Multinodular goiter, Rheumatic fever (16), Shortness of breath, Squamous cell carcinoma (02/06/2015), Vaginal prolapse (2012), and Wears glasses.    Subjective:  Patient presents alone for a follow-up visit for management of  depression. Patient was last seen in clinic on 08/09/2018      She has continued concerns about her health and specifically her thyroid nodule and pulmonary nodules.  She is going to have an MRI for further investigation of her spinal stenosis.  Other stressors include increasing cost of her rent.  She has struggled in falling asleep and her mood has worsened.  She has low energy but there is no anhedonia, appetite is normal, and concentration is ok.  Suicidal thoughts denied.        Reported side effects: no insomnia, no changes in appetite or weight, no galactorrhea or EPS, no tremor, no rash     Collateral obtained/reviewed:     Medical Hx: no changes since last  visit   Social Hx: no changes since last visit  Previous medication trials:    -     Current Medications:  Current Outpatient Medications   Medication Sig   . acetaminophen (TYLENOL) 325 mg Oral Tablet Take 2 Tabs (650 mg total) by mouth Three times a day   . amoxicillin (AMOXIL) 500 mg Oral Capsule 500 mg Three times a day Take x10 days   . aspirin 81 mg Oral Tablet, Chewable Take 1 Tab (81 mg total) by mouth Once a day   . Azelaic Acid 20 % Cream Apply topically Twice  daily   . cholecalciferol, vitamin D3, 1,000 unit (25 mcg) Oral Tablet Take 1 Tab (1,000 Units total) by mouth Once a day   . Clobetasol (TEMOVATE) 0.05 % Ointment Apply topically Twice daily To rash on hands and feet   . diclofenac sodium (VOLTAREN) 75 mg Oral Tablet, Delayed Release (E.C.) Take 1 Tab (75 mg total) by mouth Twice daily Take 1 tablet twice daily for 1 week then as needed up to twice daily   . FLUoxetine (SARAFEM) 20 mg Oral Tablet Take 1 Tab (20 mg total) by mouth Twice daily   . gabapentin (NEURONTIN) 300 mg Oral Capsule Take 2 Caps (600 mg total) by mouth Once a day   . Halobetasol Propionate (ULTRAVATE) 0.05 % Cream Apply  topically. Three times a week   . Metronidazole (METROCREAM) 0.75 % Cream Apply topically Twice daily   . nitroGLYCERIN (NITROSTAT) 0.4 mg Sublingual Tablet, Sublingual 1 Tab (0.4 mg total) by Sublingual route Every 5 minutes as needed for Chest pain for up to 3 doses for 3 doses over 15 minutes   . nystatin (NYSTOP) 100,000 unit/gram Powder Apply once or twice daily to PREVENT rash under breasts   . pantoprazole (PROTONIX) 40 mg Oral Tablet, Delayed Release (E.C.) Take 1 Tab (40 mg total) by mouth Every morning before breakfast   .  tolterodine (DETROL) 2 mg Oral Tablet Take 2 mg by mouth Once a day   . triamcinolone acetonide (ARISTOCORT A) 0.1 % Ointment by Apply Topically route Twice daily         Review of systems:   Constitutional: no insomnia, no changes in appetite, no weight loss/gain  Cardiac: no chest pain/palpitations  Resp: no shortness of breath  GI: no n/v/d/c  GU: no urinary retention, no incontinence   Msk: no arthralgias/myalgias, no rigidity, no tremulousness, no abnormal movements   Neuro: no headaches, no blurry vision, no balance issues, no falls    Physical Exam:   There were no vitals filed for this visit.      There is no height or weight on file to calculate BMI.    General:  Healthy-appearing, no acute distress  Neuro:  Self ambulatory, normal gait, no  cogwheeling rigidity, no tremors, no abnormal or involuntary movements  Skin: no rashes or lesions noted on exposed skin      Mental Status Exam:  This is an alert and oriented 63 y.o. female  who appears  stated age, wearing street clothes and well groomed.  she is cooperative, pleasant, and makes good eye contact. No psychomotor abnormalities present. Speech is regular rate and volume, clear and articulate. Mood is ok and affect is consistent and within normal range. Thought process is goal-directed with appropriate thought content. Denies SI/HI. No hallucinations, delusions, or paranoia voiced or suspected. Patient does not appear to be responding to internal stimuli. Attention is adequate for the conversation and she displays a fair ability to think abstractly. Judgement and insight is fair.       Assessment:  This is a 63 y.o. female with a history of depression and PTSD presenting for follow-up.  Patient was staying with a sister in [place] but recently was able to move into her own apartment in [place] with HUD assistance.  She receives SSI disability.  She lives by herself.  She takes prozac 40 mg daily for management of depression.  She has had depression since age 22 with multiple episodes of depression.  She has had SI but no attempts.  One hospitalization.  Current stressors include finances and health problems.  Sleep has worsened with difficult falling asleep and will add trazodone.         Diagnosis:  ENCOUNTER DIAGNOSES     ICD-10-CM   1. Current moderate episode of major depressive disorder, unspecified whether recurrent (CMS HCC) F32.1   2. PTSD (post-traumatic stress disorder) F43.10         Plan:  1. Continue fluoxetine 40 mg daily - she will utilize the tablets without blue dye due to an allergy.  2. Start trazodone 25-50 mg nightly    Medication Orders   Medications   . traZODone (DESYREL) 50 mg Oral Tablet     Sig: Take 1/2 to full tablet as needed for sleep.     Dispense:  30 Tab     Refill:   3       Laboratory Studies:  None    Therapy[facility] blum[facility]     Safety: Low risk for imminent harm to self.    Follow up: Return to clinic in 3 months . Patient advised to call in the interim with questions/concerns and to go to the ER if safety concerns arise.         Barbara Pang, MD  [facility] Medicine  Department of Behavioral  Medicine and Psychiatry  11/10/2018       Late entry for 11/10/18. I saw and examined the patient.  I reviewed the resident's note.  I agree with the findings and plan of care as documented in the resident's note.  Any exceptions/additions are edited/noted.    William Hamburger, MD

## 2018-11-14 ENCOUNTER — Ambulatory Visit (HOSPITAL_BASED_OUTPATIENT_CLINIC_OR_DEPARTMENT_OTHER): Payer: Self-pay | Admitting: Otolaryngology

## 2018-11-14 NOTE — Telephone Encounter (Signed)
Phone call to patient to follow up about her decision on surgery or observation.  Patient states she would like to have surgery.  Appointment with Dr. Posey Pronto scheduled 11/24/18 at 0745 to sign consents for surgery.  I will arrange pre-admission testing the same day. Message forwarded to Dr. Posey Pronto.  Bud Face, RN

## 2018-11-15 ENCOUNTER — Other Ambulatory Visit (HOSPITAL_BASED_OUTPATIENT_CLINIC_OR_DEPARTMENT_OTHER): Payer: Self-pay | Admitting: Family Medicine

## 2018-11-15 DIAGNOSIS — M7671 Peroneal tendinitis, right leg: Secondary | ICD-10-CM

## 2018-11-17 NOTE — Telephone Encounter (Signed)
-----   Message from Alphonzo Grieve sent at 11/17/2018  9:32 AM EST -----  Dennison Mascot, MD    Pharmacy calling to request a refill on the following. Thank you.    diclofenac sodium (VOLTAREN) 75 mg Oral Tablet, Delayed Release (E.C.)    Preferred Pharmacy     [id number] - [place], [place] - [address]    [address] [place] [address]    Phone: [phone] Fax: [phone]    Not a 24 hour pharmacy; exact hours not known.

## 2018-11-24 ENCOUNTER — Encounter (HOSPITAL_COMMUNITY): Payer: Self-pay

## 2018-11-24 ENCOUNTER — Ambulatory Visit
Admission: RE | Admit: 2018-11-24 | Discharge: 2018-11-24 | Disposition: A | Discharge status: Home / Self Care | Payer: MEDICAID | Source: Ambulatory Visit | Attending: Otolaryngology | Admitting: Otolaryngology

## 2018-11-24 ENCOUNTER — Ambulatory Visit (HOSPITAL_BASED_OUTPATIENT_CLINIC_OR_DEPARTMENT_OTHER): Payer: MEDICAID | Admitting: Otolaryngology

## 2018-11-24 ENCOUNTER — Encounter (HOSPITAL_BASED_OUTPATIENT_CLINIC_OR_DEPARTMENT_OTHER): Payer: Self-pay | Admitting: Otolaryngology

## 2018-11-24 VITALS — BP 113/78 | HR 91 | Temp 96.7°F | Resp 20 | Ht 65.87 in | Wt 210.1 lb

## 2018-11-24 VITALS — BP 137/72 | HR 95 | Temp 98.0°F | Resp 18 | Ht 65.0 in | Wt 209.7 lb

## 2018-11-24 DIAGNOSIS — Z803 Family history of malignant neoplasm of breast: Secondary | ICD-10-CM | POA: Insufficient documentation

## 2018-11-24 DIAGNOSIS — Z79899 Other long term (current) drug therapy: Secondary | ICD-10-CM | POA: Insufficient documentation

## 2018-11-24 DIAGNOSIS — E785 Hyperlipidemia, unspecified: Secondary | ICD-10-CM | POA: Insufficient documentation

## 2018-11-24 DIAGNOSIS — Z90721 Acquired absence of ovaries, unilateral: Secondary | ICD-10-CM | POA: Insufficient documentation

## 2018-11-24 DIAGNOSIS — I10 Essential (primary) hypertension: Secondary | ICD-10-CM | POA: Insufficient documentation

## 2018-11-24 DIAGNOSIS — Z01818 Encounter for other preprocedural examination: Secondary | ICD-10-CM

## 2018-11-24 DIAGNOSIS — F329 Major depressive disorder, single episode, unspecified: Secondary | ICD-10-CM | POA: Insufficient documentation

## 2018-11-24 DIAGNOSIS — I2510 Atherosclerotic heart disease of native coronary artery without angina pectoris: Secondary | ICD-10-CM

## 2018-11-24 DIAGNOSIS — E041 Nontoxic single thyroid nodule: Secondary | ICD-10-CM

## 2018-11-24 DIAGNOSIS — K219 Gastro-esophageal reflux disease without esophagitis: Secondary | ICD-10-CM | POA: Insufficient documentation

## 2018-11-24 DIAGNOSIS — Z791 Long term (current) use of non-steroidal anti-inflammatories (NSAID): Secondary | ICD-10-CM | POA: Insufficient documentation

## 2018-11-24 DIAGNOSIS — Z85828 Personal history of other malignant neoplasm of skin: Secondary | ICD-10-CM | POA: Insufficient documentation

## 2018-11-24 DIAGNOSIS — Z87891 Personal history of nicotine dependence: Secondary | ICD-10-CM | POA: Insufficient documentation

## 2018-11-24 DIAGNOSIS — E042 Nontoxic multinodular goiter: Secondary | ICD-10-CM | POA: Insufficient documentation

## 2018-11-24 DIAGNOSIS — R9431 Abnormal electrocardiogram [ECG] [EKG]: Secondary | ICD-10-CM

## 2018-11-24 DIAGNOSIS — Z90710 Acquired absence of both cervix and uterus: Secondary | ICD-10-CM | POA: Insufficient documentation

## 2018-11-24 DIAGNOSIS — Z806 Family history of leukemia: Secondary | ICD-10-CM | POA: Insufficient documentation

## 2018-11-24 LAB — POC BLOOD GLUCOSE (RESULTS): GLUCOSE, POC: 121 mg/dL — ABNORMAL HIGH (ref 70–105)

## 2018-11-24 LAB — ECG 12-LEAD (PERFORMED IN PREADMISSION UNIT ONLY)
Atrial Rate: 71 {beats}/min
Calculated P Axis: 44 degrees
Calculated R Axis: -16 degrees
Calculated T Axis: -4 degrees
PR Interval: 148 ms
QRS Duration: 86 ms
QT Interval: 398 ms
QTC Calculation: 432 ms
Ventricular rate: 71 {beats}/min

## 2018-11-24 NOTE — Anesthesia Preprocedure Evaluation (Addendum)
ANESTHESIA PRE-OP EVALUATION  Planned Procedure: HEMITHYROIDECTOMY (Right Neck)  Review of Systems     Physical Assessment      Airway       Mallampati: I    TM distance: >3 FB    Neck ROM: limited  Mouth Opening: good.  No Facial hair  No Beard  No endotracheal tube present  No Tracheostomy present    Dental           (+) upper dentures, lower dentures, edentulous           Pulmonary    Breath sounds clear to auscultation  (-) no rhonchi, no decreased breath sounds, no wheezes, no rales and no stridor     Cardiovascular    Rhythm: regular  Rate: Normal  (-) no friction rub and no murmur     Other findings            Plan  ASA 3     Planned anesthesia type: general              Intravenous induction       Anesthetic plan and risks discussed with patient.          Patient's NPO status is appropriate for Anesthesia.           Plan discussed with CRNA.               DOS: 01/18/2019       LOS: 230 mins    EKG: 11/24/2018  Normal sinus rhythm  Possible Anterolateral infarct (cited on or before 24-Nov-2018)  Abnormal ECG  When compared with ECG of 22-Jul-2016 09:45,  No significant change was found  Preliminary by fellow PATEL MD, Verlon Setting ([id number]) on 11/24/2018 3:58:30 PM  Confirmed by Pasty Spillers MD, ANTHONY ([id number]) on 11/24/2018 7:35:05 PM    MPS:02/20/16  1.  Mildly decreased anterior apical wall activity on stress images, normal at rest.  This may relate to shifting breast attenuation artifact, though mild ischemia cannot be excluded.  2.  Otherwise normal myocardial perfusion.  3.  Normal cardiac function, with calculated LVEF of 61%.    Echo: 02/20/16  Left ventricle: LV wall thickness is mildly increased. Concentric hypertrophy present. The systolic function is normal with an ejection fraction range of 55% - 60%. LV diastolic function is normal. There is no evidence of increased ventricular filling pressure by Doppler parameters.    Cardiac cath 10/2012  1. Normal left ventricular function. (EF 6-65%)  2. Mild coronary artery  disease    CXR: not ordered   CT chest without IV contrast 11/06/2018  IMPRESSION:  1. Multiple bilateral pulmonary nodules. Increased definition of a  groundglass nodule in the left lower lobe. While this is not particularly  suspicious, an additional follow-up in 12 months is suggested.  2. Interval increase in size of thyroid nodule. Ultrasound should be  considered if not previously performed.  3. Coronary artery calcifications    Labs: CBC, BMP, type and screen for 01/05/2019 by [facility]. Type and screen ordered and held for day of surgery by [facility].     Consults: none     Other: none     Patient provided anesthesia consent in [place]. Instructed to review consent prior to OR date. Educated that consent will be signed morning of surgery with anesthesiologist.  Patient instructed to avoid NSAIDs, vitamins, supplements, and fish oil 7 days prior to surgery including: Vitamin D, Voltaren.   Patient instructed to take the following medications the  morning of procedure: as needed Tylenol, Fluoxetine, Neurontin, Protonix, Detrol.   Patient instructed to hold the following medications the morning of procedure: Azelaic Acid, Ultravate, Metrocream, Aquaphor, Arisotcort and Temovate cream, Nystatin powder.   Aspirin directions per prescribing provider. Self stopped 12/07/2018.            Visit by Melodye Ped, APRN, FNP-BC  ECG: 11/24/2018    Chest CT:11/06/18  IMPRESSION:  1. Multiple bilateral pulmonary nodules. Increased definition of a  groundglass nodule in the left lower lobe. While this is not particularly  suspicious, an additional follow-up in 12 months is suggested.  2. Interval increase in size of thyroid nodule. Ultrasound should be  considered if not previously performed.  3. Coronary artery calcifications    Labs:11/24/2018      MPS:02/20/16  IMPRESSION:     1.  Mildly decreased anterior apical wall activity on stress images, normal at rest.  This may relate to shifting breast attenuation artifact, though mild ischemia  cannot be excluded.  2.  Otherwise normal myocardial perfusion.  3.  Normal cardiac function, with calculated LVEF of 61%.    Echo:02/20/16   Left ventricle: LV wall thickness is mildly increased. Concentric hypertrophy present. The systolic function is normal with an ejection fraction range of 55% - 60%. LV diastolic function is normal. There is no evidence of increased ventricular filling pressure by Doppler parameters.    Cardiac cath 10/2012  1. Normal left ventricular function. (EF 6-65%)  2. Mild coronary artery disease    Patient instructed to take the following medications the morning of surgery   Patient instructed to hold the following medications the morning of surgery   Patient instructed to hold nsaids, vitamins, fish oil, and herbal supplements 7 days prior to the procedure.    Patient provided anesthesia consent in PAT. Instructed to review consent prior to OR date. Educated that consent will be signed morning of surgery with anesthesiologist      PEC evaluation not completed. Patient surgery 01-18-2019 and decided to come back closer to her surgery date.

## 2018-11-24 NOTE — Patient Instructions (Signed)
Surgery Instructions      . Someone will contact you via telephone the day before surgery between 2 pm and 5 pm to discuss what time to arrive the day of the surgery.    . Please stop Herbal Supplements, Fish oil, NSAIDs 7 days prior to your surgery date.    . Plavix, Coumadin, Aspirin, and Eliquis will need to be stopped with the instructions of the prescribing physician (your family physician or cardiologist).    . Your Pre-Operative testing appointment is scheduled today, 11/24/18  at 830 am in the [facility]    . Your surgery date is pending, We will call you with a date.    . If you have any questions please call me at [phone] and ask to speak with Wyona Almas RN.

## 2018-11-24 NOTE — Progress Notes (Signed)
[facility] [place] [id number]  [phone]    Patient Name: [name]   MRN:  [id number]   DOB: 06/13/1956   Date of Service:  11/24/2018     ENT HEAD AND NECK ONCOLOGY SURGERY AND RECONSTRUCTION     FOLLOW UP NOTE     HPI: This is a 63 y.o. female who presents Followup of a thyroid nodule.  To summarize, the patient has a known right thyroid nodule.  It has been stable in size. Did a recent biopsy which showed atypical cells.  She does have a first-degree relative, her sister who has known papillary thyroid cancer.  We have been talking about observation versus repeat biopsy versus a hemithyroidectomy. She wanted to think about the option of hemithyroidectomy for definitive diagnosis.  She has since thought about it and would like to proceed with surgery. No recent changes in her health history that we know of.        Past Medical History:  Past Medical History:   Diagnosis Date   . Arthritis    . Cancer (CMS HCC)     internal skin cancer   . Depression    . Essential hypertension 02/16/2017   . GERD (gastroesophageal reflux disease)     controlled w/med   . Heart murmur 1979    benign   . Hyperlipidemia LDL goal < 100 08/29/2013   . MINOR CAD (coronary artery disease) 11/10/2012    no treatment other than cholesterol medications; follows regularly with Dr. Rodney Booze   . Multinodular goiter    . Rheumatic fever 16    no complications   . Shortness of breath     on exertion   . Squamous cell carcinoma 02/06/2015    right side of neck   . Vaginal prolapse 2012    surgery improved significantly   . Wears glasses            Past Surgical History:  Past Surgical History:   Procedure Laterality Date   . COLONOSCOPY  01/06/09    COLONOSCOPY performed by Loletta Parish F at [place]   . GASTROSCOPY  03/05/2010    GASTROSCOPY performed by Jocelyn Lamer at [facility]   . GASTROSCOPY WITH BIOPSY  03/05/2010    GASTROSCOPY WITH BIOPSY performed by Jocelyn Lamer at [place]   . HX  ADENOIDECTOMY     . HX ANKLE FRACTURE [place]  2007    left distal fibula, casted   . HX APPENDECTOMY     . HX CHOLECYSTECTOMY     . HX COLONOSCOPY     . HX CYSTOCELE REPAIR  09/24/09   . HX HAND SURGERY  2012    for Carpal tunnel    . HX OOPHORECTOMY      left ovary removed   . HX TONSILLECTOMY     . HX TOTAL VAGINAL HYSTERECTOMY  1979   . HX WRIST FRACTURE [place]  2007    [place] injury   . PB REVISE ULNAR NERVE AT ELBOW  1979   . PB UPPER GI ENDOSCOPY,BIOPSY  12/19/07    patulous GE junction zone, erythema, nonerosive GERD           Family History: Patient's family history was reviewed in writing and with the patient with findings as noted below. Unless otherwise noted, patient has no family history of similar problems.   Family Medical History:     Problem Relation (  Age of Onset)    Bipolar Disorder Daughter    Breast Cancer Paternal Aunt, Paternal Aunt, Paternal Aunt, Paternal 52, Paternal 19, Other    Cancer Paternal Aunt, Paternal 77, Paternal 79, Paternal 48, Paternal 31, Other (51)    Congestive Heart Failure Father (61)    Coronary Artery Disease Father, Mother (11)    Diabetes Mother    Heart Attack Father, Sister    High Cholesterol Mother    Hypertension (High Blood Pressure) Mother, Brother, Sister, Sister    Kidney Disease Sister    Leukemia Paternal Uncle, Paternal Uncle    Stroke Paternal Grandmother    Thyroid Disease Sister               MEDICATIONS:    Current Outpatient Medications:   .  acetaminophen (TYLENOL) 325 mg Oral Tablet, Take 2 Tabs (650 mg total) by mouth Three times a day, Disp: 180 Tab, Rfl: 0  .  Azelaic Acid 20 % Cream, Apply topically Twice daily, Disp: 50 g, Rfl: 0  .  cholecalciferol, vitamin D3, 1,000 unit (25 mcg) Oral Tablet, Take 1 Tab (1,000 Units total) by mouth Once a day, Disp: 30 Tab, Rfl: 3  .  Clobetasol (TEMOVATE) 0.05 % Ointment, Apply topically Twice daily To rash on hands and feet, Disp: 60 g, Rfl: 0  .  diclofenac sodium (VOLTAREN) 75 mg Oral Tablet, Delayed  Release (E.C.), Take 1 Tab (75 mg total) by mouth Once per day as needed, Disp: 30 Tab, Rfl: 0  .  FLUoxetine (SARAFEM) 20 mg Oral Tablet, Take 1 Tab (20 mg total) by mouth Twice daily, Disp: 60 Tab, Rfl: 5  .  gabapentin (NEURONTIN) 300 mg Oral Capsule, Take 2 Caps (600 mg total) by mouth Once a day, Disp: 60 Cap, Rfl: 5  .  Halobetasol Propionate (ULTRAVATE) 0.05 % Cream, Apply  topically. Three times a week, Disp: 15 g, Rfl: 2  .  Metronidazole (METROCREAM) 0.75 % Cream, Apply topically Twice daily, Disp: 45 g, Rfl: 1  .  nitroGLYCERIN (NITROSTAT) 0.4 mg Sublingual Tablet, Sublingual, 1 Tab (0.4 mg total) by Sublingual route Every 5 minutes as needed for Chest pain for up to 3 doses for 3 doses over 15 minutes, Disp: 20 Tab, Rfl: 0  .  nystatin (NYSTOP) 100,000 unit/gram Powder, Apply once or twice daily to PREVENT rash under breasts, Disp: 60 g, Rfl: 11  .  pantoprazole (PROTONIX) 40 mg Oral Tablet, Delayed Release (E.C.), Take 1 Tab (40 mg total) by mouth Every morning before breakfast, Disp: 30 Tab, Rfl: 5  .  tolterodine (DETROL) 2 mg Oral Tablet, Take 2 mg by mouth Once a day, Disp: , Rfl:   .  traZODone (DESYREL) 50 mg Oral Tablet, Take 1/2 to full tablet as needed for sleep., Disp: 30 Tab, Rfl: 3  .  triamcinolone acetonide (ARISTOCORT A) 0.1 % Ointment, by Apply Topically route Twice daily, Disp: 80 g, Rfl: 2     ALLERGIES:  Allergy History as of 11/24/18     CODEINE       Noted Status Severity Type Reaction    12/10/15 0941 Pantojas, Murphy  Active Low  Itching    Comments:  RASH     09/19/13 1430 Wallace Cullens Joy  Deleted Low  Itching    Comments:  RASH     09/04/09 Ginette Pitman  Active   Itching    Comments:  RASH     10/10/07   Active  Comments:  RASH           STRAWBERRY       Noted Status Severity Type Reaction    03/18/11 1634 Johnnette Barrios  Deleted       10/10/08 [name], MD  Deleted       10/10/07   Active             MELOXICAM       Noted Status Severity Type Reaction     12/10/15 0941 Pantojas, Marita [place] 06/26/09 Active Low  Nausea/ Vomiting    06/26/09 Corky Sing [place] 06/26/09 Active   Nausea/ Vomiting          NICKEL       Noted Status Severity Type Reaction    07/24/12 1041 [name], MD 07/24/12 Active  Topical           BLUE DYE       Noted Status Severity Type Reaction    07/24/12 1041 [name], MD 07/24/12 Active  Topical     Comments:  Told to avoid due to patch test result by [facility]           DULOXETINE       Noted Status Severity Type Reaction    07/24/12 1052 [name], MD 07/24/12 Active Medium Systemic     Comments:  Rash on feet and hands           NAPROXEN       Noted Status Severity Type Reaction    11/01/12 0913 Sanjuana Letters, MA 11/01/12 Active Low  Nausea/ Vomiting          ATORVASTATIN       Noted Status Severity Type Reaction    06/19/18 2149 Erskine Squibb, RX STUDENT 01/27/15 Active  Side Effect Myalgia    Comments:  Muscle pain     01/27/15 1035 [name], MD 01/27/15 Active  Side Effect     Comments:  Muscle pain           FLAVORING AGENT       Noted Status Severity Type Reaction    04/03/18 1905 Suella [place], MA 05/03/17 Active             NSAIDS (NON-STEROIDAL ANTI-INFLAMMATORY DRUG)       Noted Status Severity Type Reaction    04/03/18 1905 Suella [place], MA 05/03/17 Active                    SOCIAL HISTORY:  Social History     Socioeconomic History   . Marital status: Divorced     Spouse name: Not on file   . Number of children: Not on file   . Years of education: Not on file   . Highest education level: Not on file   Occupational History   . 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 Financial resource strain: Not on file   . Food insecurity:     Worry: Not on file     Inability: Not on file   . Transportation needs:     Medical: Not on file     Non-medical: Not on file   Tobacco Use   . Smoking status: Former Smoker      Packs/day: 2.00     Years: 40.00     Pack years: 80.00     Types: Cigarettes  Last attempt to quit: 01/22/2005     Years since quitting: 13.8   . Smokeless tobacco: Never Used   Substance and Sexual Activity   . Alcohol use: No     Alcohol/week: 0.0 standard drinks   . Drug use: No   . Sexual activity: Not on file   Lifestyle   . Physical activity:     Days per week: Not on file     Minutes per session: Not on file   . Stress: Not on file   Relationships   . Social connections:     Talks on phone: Not on file     Gets together: Not on file     Attends religious service: Not on file     Active member of club or organization: Not on file     Attends meetings of clubs or organizations: Not on file     Relationship status: Not on file   . Intimate partner violence:     Fear of current or ex partner: Not on file     Emotionally abused: Not on file     Physically abused: Not on file     Forced sexual activity: Not on file   Other Topics Concern   . Abuse/Domestic Violence Not Asked   . Breast Self Exam Not Asked   . Caffeine Concern Not Asked   . Calcium intake adequate Not Asked   . Computer Use Not Asked   . Exercise Concern Not Asked   . Helmet Use Not Asked   . Seat Belt Not Asked   . Special Diet Not Asked   . Sunscreen used Not Asked   . Uses Cane No   . Uses walker No   . Uses wheelchair No   . Right hand dominant Yes   . Left hand dominant No   . Ambidextrous No   . Ability to Walk 1 Flight of Steps without SOB/CP Yes   . Routine Exercise Yes     Comment: walk   . Ability to Walk 2 Flight of Steps without SOB/CP Yes   . Unable to Ambulate Not Asked   . Total Care Not Asked   . Ability To Do Own ADL's Yes   . Uses Walker Not Asked   . Other Activity Level Not Asked   . Uses Cane Not Asked   . Drives Not Asked   . Shift Work No   . Unusual Sleep-Wake Schedule No   Social History Narrative   . Not on file         PHYSICAL EXAM:  BP 137/72   Pulse 95   Temp 36.7 C (98 F) (Thermal Scan)   Resp 18   Ht 1.651 m  (5\' 5" )   Wt 95.1 kg (209 lb 10.5 oz)   BMI 34.89 kg/m       Body mass index is 34.89 kg/m.  General Appearance: Pleasant, cooperative, healthy, and in no acute distress.  Eyes: Conjunctivae/corneas clear, EOM's intact.  Head and Face: Normocephalic, atraumatic.  Face symmetric, no obvious lesions.   Ears: Normal shape and position.   Nose:  External pyramid midline. Septum midline.   Cardiovascular:  Good perfusion of upper extremities.  No cyanosis of the hands or fingers.  Lungs: No apparent stridorous breathing. No acute distress.  Neurologic: Cranial nerves: grossly intact.  Psychiatric:  Alert and oriented x 3.    Procedure: Flexible Laryngoscopy, Diagnostic (89381)  Name: [name]  DOB:  June 26, 1956  Date: 08/11/2018     Attending: Delfino Lovett, MD  Operator: Logan Bores, PA-C  Indications: Due to patient's symptoms and because an indirect mirror examination was inadequate, flexible fiberoptic transnasal laryngoscopy is required to visualize any possible lesions, masses or other abnormalities which may be present in the nasopharynx, hypopharynx and larynx.  After the nasal cavity was anesthetized/decongested with topical lidocaine and oxymetazoline, flexible laryngoscope was passed.     Findings:    Nasopharynx: Normal mucosa and no lesions of the nasopharyngeal walls.  The Eustachian tube orifices are normal.     Oropharynx/Hypopharynx: No lesions of the epiglottis, posterior pharyngeal walls or piriform mucosa.  There is no pooling of secretions.    Larynx: The vocal cords are mobile and adduct to midline bilaterally.  There are no lesions of the aryepiglottic folds, arytenoid mucosa, interarytenoid mucosa, false vocal folds, or true vocal folds.  There is good abduction with sniff.  The airway is patent.      The patient tolerated the procedure well.    I personally performed the scope exam.  Delfino Lovett, MD supervised the procedure.    Logan Bores, PA-C    LABS:   03/09/18:  TSH:  0.977  T4: 1.08  Vitamin D: 19     PATHOLOGY:   Final Cytologic Diagnosis  A. Thyroid, Right Mid, Fine Needle Aspiration with Cell Block:    - Satisfactory for evaluation.    - Atypia of undetermined significance.  - Follicular cells with focal cytologic and architectural atypia, and thick  colloid (see Comment).      Comment  Specimen has been sent for Afirma testing and results will be issued in an  addendum.    IMAGING: [place]  Female, 63 years old.    US THYROID performed on 07/25/2018 4:03 PM.    REASON FOR EXAM:  E04.2: Multinodular goiter    COMPARISON: None    FINDINGS:  THYROID: The right thyroid gland measures 5.7 x 1.8 x 1.8 cm, length, AP,  transverse. The left thyroid gland measures 5.2 x 1.4 x 1.5 cm., length,  AP, transverse. Vascular flow to the glands are unremarkable. The thyroid  parenchyma is mildly heterogeneous in appearance..    The isthmus measures 2 mm in thickness.    NODULES:  Multiple nodules and cysts are seen in the right thyroid gland  with the smaller cyst and nodules measuring between 2 to 6 mm in size. The  dominant nodule in the right thyroid gland is minimally hypoechoic  measuring 1.7 x 0.7 cm.    In the isthmus is a hypoechoic nodule measuring 0.7 x 0.3 cm. There is a 3  mm hypoechoic nodule in the upper and mid left thyroid gland. 3 mm cyst and  a 3 mm hypoechoic nodule are also seen within the lower thyroid gland.      IMPRESSION:  1. Thyroid is normal in size with numerous tiny cysts and nodules in the  bilateral glands and isthmus ranging between 2 to 7 mm.  2. Dominant nodule in the right thyroid gland measures 1.7 cm.        ASSESSMENT/PLAN: The patient comes in for evaluation of a right thyroid nodule with biopsy showing atypical cells of uncertain significance.  We talked about surgery in detail, specifically, I went over the risks of pain, bleeding, recurrent laryngeal nerve injury, hypothyroidism, as well as need for additional  surgery.  The patient understood and consents were signed.  Delfino Lovett, MD

## 2018-12-04 ENCOUNTER — Encounter (HOSPITAL_BASED_OUTPATIENT_CLINIC_OR_DEPARTMENT_OTHER): Payer: Self-pay | Admitting: Family Medicine

## 2018-12-05 ENCOUNTER — Other Ambulatory Visit: Payer: Self-pay

## 2018-12-05 ENCOUNTER — Encounter (HOSPITAL_BASED_OUTPATIENT_CLINIC_OR_DEPARTMENT_OTHER): Payer: Self-pay | Admitting: Family Medicine

## 2018-12-05 ENCOUNTER — Ambulatory Visit: Payer: MEDICAID | Attending: Family Medicine | Admitting: Family Medicine

## 2018-12-05 VITALS — BP 122/68 | HR 85 | Temp 97.2°F | Resp 18 | Ht 65.12 in | Wt 207.9 lb

## 2018-12-05 DIAGNOSIS — Z6834 Body mass index (BMI) 34.0-34.9, adult: Secondary | ICD-10-CM

## 2018-12-05 DIAGNOSIS — F17200 Nicotine dependence, unspecified, uncomplicated: Secondary | ICD-10-CM

## 2018-12-05 DIAGNOSIS — R911 Solitary pulmonary nodule: Secondary | ICD-10-CM | POA: Insufficient documentation

## 2018-12-05 DIAGNOSIS — Z87891 Personal history of nicotine dependence: Secondary | ICD-10-CM | POA: Insufficient documentation

## 2018-12-05 DIAGNOSIS — M7671 Peroneal tendinitis, right leg: Secondary | ICD-10-CM | POA: Insufficient documentation

## 2018-12-05 NOTE — Progress Notes (Signed)
Department of Family Medicine    [name]  DOB: 25-Aug-1956, 63 y.o.   MRN: [id number]  Date of encounter: 12/05/2018    Chief Complaint   Patient presents with   . Follow-up After Testing     Left lung CT   . Foot Pain     right     Subjective:    63 y.o. female presenting for follow up on right peroneal tendinitis.    R Peroneal Tendonitis  - diagnosed 10/24/18  - Rx voltaren 75mg  BID scheduled for 1 week and then PRN, avoid other NSAIDs  - instructed how to tape ankle and given option for ankle sleeve  - given hand out on eccentric exercises at appt  - exam at that time demonstrated pain with plantar flexion and eversion, pain on palpation posterior to lateral malleolus  - patient stopped using Voltaren as it bothered her stomach  - she tried to do exercises at home but wasn't able to do a lot due to pain  - she has been waling more thinking it would help stretch it out  - she does ice occasionally with good relief  - notes that swelling seems somewhat improved    Tobacco Use Disorder  - former smoker   - discussed CT lung screen results, answered all questions      Med reconciliation completed in today's visit.   Medical, familial, surgical, and social history updated in EMR where appropriate.     Allergies   Allergen Reactions   . Cymbalta [Duloxetine]      Rash on feet and hands   . Blue Dye      Told to avoid due to patch test result by [name]   . Flavoring Agent    . Lipitor [Atorvastatin] Myalgia     Muscle pain   . Nickel      Blisters     . Codeine Itching     RASH   . Mobic [Meloxicam] Nausea/ Vomiting     Nervous and shakes    . Naprosyn [Naproxen] Nausea/ Vomiting   . Nsaids (Non-Steroidal Anti-Inflammatory Drug) Nausea/ Vomiting     Current Outpatient Medications   Medication Sig   . acetaminophen (TYLENOL) 325 mg Oral Tablet Take 2 Tabs (650 mg total) by mouth Three times a day (Patient taking differently: Take 650 mg by mouth Every night )   . aspirin (ECOTRIN) 81 mg Oral Tablet, Delayed  Release (E.C.) Take 81 mg by mouth Once a day   . Azelaic Acid 20 % Cream Apply topically Twice daily (Patient taking differently: Apply topically Twice per day as needed )   . cholecalciferol, vitamin D3, 1,000 unit (25 mcg) Oral Tablet Take 1 Tab (1,000 Units total) by mouth Once a day   . Clobetasol (TEMOVATE) 0.05 % Ointment Apply topically Twice daily To rash on hands and feet (Patient taking differently: Apply topically Twice per day as needed To rash on hands and feet)   . clobetasoL (TEMOVATE) 0.05 % Ointment Apply topically   . diclofenac sodium (VOLTAREN) 75 mg Oral Tablet, Delayed Release (E.C.) Take 1 Tab (75 mg total) by mouth Once per day as needed   . FLUoxetine (SARAFEM) 20 mg Oral Tablet Take 1 Tab (20 mg total) by mouth Twice daily   . gabapentin (NEURONTIN) 300 mg Oral Capsule Take 2 Caps (600 mg total) by mouth Once a day   . Halobetasol Propionate (ULTRAVATE) 0.05 % Cream Apply  topically. Three times  a week (Patient taking differently: Twice per day as needed Apply  topically. Three times a week)   . Halobetasol Propionate (ULTRAVATE) 0.05 % Cream Apply topically   . Metronidazole (METROCREAM) 0.75 % Cream Apply topically Twice daily (Patient taking differently: Apply topically Twice per day as needed )   . Metronidazole (METROCREAM) 0.75 % Cream Apply topically   . mineral oil-hydrophil petrolat (AQUAPHOR) Ointment Apply topically   . nitroGLYCERIN (NITROSTAT) 0.4 mg Sublingual Tablet, Sublingual 1 Tab (0.4 mg total) by Sublingual route Every 5 minutes as needed for Chest pain for up to 3 doses for 3 doses over 15 minutes   . nystatin (NYSTOP) 100,000 unit/gram Powder Apply once or twice daily to PREVENT rash under breasts   . nystatin (NYSTOP) 100,000 unit/gram Powder Apply 1 g topically   . pantoprazole (PROTONIX) 40 mg Oral Tablet, Delayed Release (E.C.) Take 1 Tab (40 mg total) by mouth Every morning before breakfast   . tolterodine (DETROL) 2 mg Oral Tablet Take 2 mg by mouth Once a day      . traZODone (DESYREL) 50 mg Oral Tablet Take 1/2 to full tablet as needed for sleep.   Marland Kitchen triamcinolone acetonide (ARISTOCORT A) 0.1 % Ointment by Apply Topically route Twice daily (Patient taking differently: by Apply Topically route Twice per day as needed )   . triamcinolone acetonide (ARISTOCORT A) 0.1 % Ointment Apply topically     Social History     Tobacco Use   . Smoking status: Former Smoker     Packs/day: 2.00     Years: 40.00     Pack years: 80.00     Types: Cigarettes     Last attempt to quit: 01/22/2005     Years since quitting: 13.8   . Smokeless tobacco: Never Used   Substance Use Topics   . Alcohol use: No     Alcohol/week: 0.0 standard drinks   . Drug use: No       Review of Systems: Positive ROS discussed in HPI, otherwise all other systems negative.    Objective:  Blood pressure 122/68, pulse 85, temperature 36.2 C (97.2 F), temperature source Thermal Scan, resp. rate 18, height 1.654 m (5' 5.12"), weight 94.3 kg (207 lb 14.3 oz), SpO2 97 %, not currently breastfeeding.Body mass index is 34.47 kg/m.    Constitutional: appears stated age, in no acute distress  Eyes: pupils equal and round  Neck: supple, symmetric, trachea midline  Respiratory: non-labored breathing, clear to auscultation bilaterally  Cardiovascular: regular rate and rhythm, no murmurs/gallops/rubs  Abdomen: soft, non-distended, non-tender, no guarding  Extremities: no cyanosis or edema  Skin: warm and dry, no rash  Neuro: grossly normal  Psychiatric: normal affect and behavior  MSK: no pain on palpation of right ankle and foot, minimal lateral malleolus edema, full ROM, pain with plantar flexion      Assessment/Plan:  [name] 63 y.o. female     R Peroneal Tendonitis   - ref to physical therapy provided  - encouraged rest, ice, compression    Tobacco Use Disorder  - stopped smoking years ago  - LDCT due in 1 year to f/u ground glass nodule      RTC 34months for chronic follow up    Dennison Mascot, MD  PGY-2 Family  Medicine          Prior to the patient's discharge today, I saw and examined the patient. I agree with the resident's/fellow's/nurse practitioner's//trainer's assessment and plan. Any exceptions  are noted. I have reviewed and discussed the prevention plan with the patient.    Mackie Pai, MD  Associate Professor  Director, Division of Sports Medicine  [facility]  Operated by [facility]

## 2018-12-06 ENCOUNTER — Ambulatory Visit (HOSPITAL_BASED_OUTPATIENT_CLINIC_OR_DEPARTMENT_OTHER): Payer: MEDICAID | Admitting: Neurology

## 2018-12-06 ENCOUNTER — Ambulatory Visit
Admission: RE | Admit: 2018-12-06 | Discharge: 2018-12-06 | Disposition: A | Discharge status: Home / Self Care | Payer: MEDICAID | Source: Ambulatory Visit | Attending: Neurology | Admitting: Neurology

## 2018-12-06 VITALS — BP 126/84 | HR 73 | Ht 65.12 in | Wt 207.9 lb

## 2018-12-06 DIAGNOSIS — G5601 Carpal tunnel syndrome, right upper limb: Secondary | ICD-10-CM | POA: Insufficient documentation

## 2018-12-06 DIAGNOSIS — Z79899 Other long term (current) drug therapy: Secondary | ICD-10-CM | POA: Insufficient documentation

## 2018-12-06 DIAGNOSIS — M47812 Spondylosis without myelopathy or radiculopathy, cervical region: Secondary | ICD-10-CM

## 2018-12-06 DIAGNOSIS — M542 Cervicalgia: Secondary | ICD-10-CM | POA: Insufficient documentation

## 2018-12-06 DIAGNOSIS — G5641 Causalgia of right upper limb: Secondary | ICD-10-CM | POA: Insufficient documentation

## 2018-12-06 DIAGNOSIS — M50323 Other cervical disc degeneration at C6-C7 level: Secondary | ICD-10-CM

## 2018-12-06 DIAGNOSIS — Z87891 Personal history of nicotine dependence: Secondary | ICD-10-CM | POA: Insufficient documentation

## 2018-12-06 DIAGNOSIS — M50322 Other cervical disc degeneration at C5-C6 level: Secondary | ICD-10-CM

## 2018-12-06 DIAGNOSIS — G90511 Complex regional pain syndrome I of right upper limb: Secondary | ICD-10-CM

## 2018-12-06 DIAGNOSIS — M5090 Cervical disc disorder, unspecified, unspecified cervical region: Secondary | ICD-10-CM

## 2018-12-06 DIAGNOSIS — M4692 Unspecified inflammatory spondylopathy, cervical region: Secondary | ICD-10-CM

## 2018-12-06 MED ORDER — GABAPENTIN 600 MG TABLET: 600 mg | Tab | Freq: Two times a day (BID) | ORAL | 4 refills | 0 days | Status: AC

## 2018-12-06 MED ORDER — GADOBUTROL 10 MMOL/10 ML (1 MMOL/ML) INTRAVENOUS SOLUTION
9.50 mL | INTRAVENOUS | Status: AC
Start: 2018-12-06 — End: 2018-12-06
  Administered 2018-12-06: 12:00:00 9.5 mL via INTRAVENOUS

## 2018-12-06 MED ADMIN — gadobutroL 10 mmol/10 mL (1 mmol/mL) intravenous solution: INTRAVENOUS | @ 12:00:00

## 2018-12-07 NOTE — Progress Notes (Signed)
[facility]  Outpatient History and Physical    Date:  12/06/2018  Name: [name]  Age:  63 y.o.  Referring Physician:   Drema Balzarine, MD  [address]  [place], [place] [address]  Dear Dr.Sedlmeyer,    I had the pleasure of seeingMs.Williamswho present to the neurology clinic at [facility] for thereturn visit. For your convenience, I have written my impression and recommendations first with the full history written beneath. Please do not hesitate to call with any further questions or concerns.    On the last visit, we recommend that the patient get an MRI imaging of the cervical spine, which was done from August 2019, which showed disk bulge at C5-C6-C6-C7 indenting the thecal sac without significant cord impingement.  We are going to get a neurosurgical referral for the patient to look for any recommendation for hercervical disc disease with impingement on thecal sac.  We are also going to give her a script for the physical therapy.      We are going to increase the dose of gabapentin at mg 600mg  two times a day and after 1 week increase and maintain at 600mg  three times a day.    Sincerely,   Catalina Gravel, MD   Assistant Professor of Neurology  History of Present Illness  History obtained from:  patient   PROGRESS NOTE     SUBJECTIVE:  [name] is a 63 year old right-handed Caucasian woman who comes to return visit at the neurology clinic.  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      On the last visit, we got an EMG nerve conduction study of her upper extremities. There was no plexopathy, radiculopathy noted on the EMG nerve conduction study from July 2019.  There was no evidence of median neuropathy or at the right wrist or right ulna neuropathy.  We also recommend  the patient to get MRI imaging of the cervical spine.  This was done from August 2019, which showed significant disk bulge at C5-C6 and C6-C7, impinging on the thecal sac without significant cord compression.  She continued to have tingling and numbness in the hand, finger and a radicular pain in the right side in the upper extremities.  With gabapentin 600 mg daily, she tolerated the medication well.  She also takes Advil as needed.      On the last visit, we recommended the patient will also benefit from physical therapy for underlying synovitis of the right elbow versus chronic regional pain syndrome.  Her symptoms get worse with activity of the right hand and get better with rest.  She denies any skin color change.  She denies any tingling or numbness in the hand or fingers.  She denies any muscle wasting.  She denies any history of neck trauma.  She described this pain as a continuous sharp pain around her elbow and occasionally in the right forearm.  She also complains of a burning sensation.       She has been on gabapentin and lidocaine patch as given by her primary care physician.  She denies bowel and bladder incontinence.  She denies any weakness in the lower extremities.  She denies any muscle spasms.  Occasionally, she complains of pain in the right part of  the neck.  It occasionally radiates in the right hand.  In the past she was on Cymbalta, but she broke out with blisters she is allergic to this medication.     PAST MEDICAL HISTORY:  Depression.     PAST SURGICAL HISTORY:  Significant for left ulnar nerve repair surgery and surgery done for right parotid gland, hysterectomy, cholecystectomy, appendectomy.     SOCIAL HISTORY:  She is single and has 3 children.  She is a nonsmoker, no IV drugs, no recreational drug use, and nonalcoholic.  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     FAMILY HISTORY:  She denies any family history of CMT.    [name] is a 63 y.o. female who  presents with a chief complaint of   Chief Complaint   Patient presents with   . Follow Up 6 Months        The patient reports: complaint(s) of:  numbness or tingling and neck pain.     Past Medical History  Current Outpatient Medications   Medication Sig   . acetaminophen (TYLENOL) 325 mg Oral Tablet Take 2 Tabs (650 mg total) by mouth Three times a day (Patient taking differently: Take 650 mg by mouth Every night )   . aspirin (ECOTRIN) 81 mg Oral Tablet, Delayed Release (E.C.) Take 81 mg by mouth Once a day   . Azelaic Acid 20 % Cream Apply topically Twice daily (Patient taking differently: Apply topically Twice per day as needed )   . cholecalciferol, vitamin D3, 1,000 unit (25 mcg) Oral Tablet Take 1 Tab (1,000 Units total) by mouth Once a day   . Clobetasol (TEMOVATE) 0.05 % Ointment Apply topically Twice daily To rash on hands and feet (Patient taking differently: Apply topically Twice per day as needed To rash on hands and feet)   . clobetasoL (TEMOVATE) 0.05 % Ointment Apply topically   . diclofenac sodium (VOLTAREN) 75 mg Oral Tablet, Delayed Release (E.C.) Take 1 Tab (75 mg total) by mouth Once per day as needed   . FLUoxetine (SARAFEM) 20 mg Oral Tablet Take 1 Tab (20 mg total) by mouth Twice daily   . gabapentin (NEURONTIN) 600 mg Oral Tablet Take 1 Tab (600 mg total) by mouth Twice daily   . Halobetasol Propionate (ULTRAVATE) 0.05 % Cream Apply  topically. Three times a week (Patient taking differently: Twice per day as needed Apply  topically. Three times a week)   . Halobetasol Propionate (ULTRAVATE) 0.05 % Cream Apply topically   . Metronidazole (METROCREAM) 0.75 % Cream Apply topically Twice daily (Patient taking differently: Apply topically Twice per day as needed )   . Metronidazole (METROCREAM) 0.75 % Cream Apply topically   . mineral oil-hydrophil petrolat (AQUAPHOR) Ointment Apply topically   . nitroGLYCERIN (NITROSTAT) 0.4 mg Sublingual Tablet, Sublingual 1 Tab (0.4 mg total) by Sublingual  route Every 5 minutes as needed for Chest pain for up to 3 doses for 3 doses over 15 minutes   . nystatin (NYSTOP) 100,000 unit/gram Powder Apply once or twice daily to PREVENT rash under breasts   . nystatin (NYSTOP) 100,000 unit/gram Powder Apply 1 g topically   . pantoprazole (PROTONIX) 40 mg Oral Tablet, Delayed Release (E.C.) Take 1 Tab (40 mg total) by mouth Every morning before breakfast   . tolterodine (DETROL) 2 mg Oral Tablet Take 2 mg by mouth Once a day   . traZODone (DESYREL) 50 mg Oral Tablet Take 1/2 to  full tablet as needed for sleep.   Marland Kitchen triamcinolone acetonide (ARISTOCORT A) 0.1 % Ointment by Apply Topically route Twice daily (Patient taking differently: by Apply Topically route Twice per day as needed )   . triamcinolone acetonide (ARISTOCORT A) 0.1 % Ointment Apply topically     Allergies   Allergen Reactions   . Cymbalta [Duloxetine]      Rash on feet and hands   . Blue Dye      Told to avoid due to patch test result by Simone Curia   . Flavoring Agent    . Lipitor [Atorvastatin] Myalgia     Muscle pain   . Nickel      Blisters     . Codeine Itching     RASH   . Mobic [Meloxicam] Nausea/ Vomiting     Nervous and shakes    . Naprosyn [Naproxen] Nausea/ Vomiting   . Nsaids (Non-Steroidal Anti-Inflammatory Drug) Nausea/ Vomiting     Past Medical History:   Diagnosis Date   . Arthritis    . Cancer (CMS HCC)     internal skin cancer   . Depression    . Essential hypertension 02/16/2017   . GERD (gastroesophageal reflux disease)     controlled w/med   . Heart murmur 1979    benign   . Hyperlipidemia LDL goal < 100 08/29/2013   . MINOR CAD (coronary artery disease) 11/10/2012    no treatment other than cholesterol medications; follows regularly with Dr. Rodney Booze   . Multinodular goiter    . Rheumatic fever 16    no complications   . Shortness of breath     on exertion   . Squamous cell carcinoma 02/06/2015    right side of neck   . Vaginal prolapse 2012    surgery improved significantly   . Wears glasses             Past Surgical History:   Procedure Laterality Date   . COLONOSCOPY  01/06/09    COLONOSCOPY performed by Loletta Parish F at [place]   . GASTROSCOPY  03/05/2010    GASTROSCOPY performed by Jocelyn Lamer at [place]   . GASTROSCOPY WITH BIOPSY  03/05/2010    GASTROSCOPY WITH BIOPSY performed by Jocelyn Lamer at [place]   . HX ADENOIDECTOMY     . HX ANKLE FRACTURE [place]  2007    left distal fibula, casted   . HX APPENDECTOMY     . HX CHOLECYSTECTOMY     . HX COLONOSCOPY     . HX CYSTOCELE REPAIR  09/24/09   . HX HAND SURGERY  2012    for Carpal tunnel    . HX OOPHORECTOMY      left ovary removed   . HX TONSILLECTOMY     . HX TOTAL VAGINAL HYSTERECTOMY  1979   . HX WRIST FRACTURE [place]  2007    [place] injury   . PB REVISE ULNAR NERVE AT ELBOW Left 1979   . PB UPPER GI ENDOSCOPY,BIOPSY  12/19/07    patulous GE junction zone, erythema, nonerosive GERD         Family Medical History:     Problem Relation (Age of Onset)    Bipolar Disorder Daughter    Breast Cancer Paternal Aunt, Paternal Aunt, Paternal Aunt, Paternal Aunt, Paternal 18, Other    Cancer Paternal Aunt, Paternal 24, Paternal 32, Paternal 34, Paternal Aunt, Other (55)    Congestive Heart  Failure Father (58)    Coronary Artery Disease Father, Mother (11)    Diabetes Mother    Heart Attack Father, Sister    High Cholesterol Mother    Hypertension (High Blood Pressure) Mother, Brother, Sister, Sister    Kidney Disease Sister    Leukemia Paternal Uncle, Paternal Uncle    Stroke Paternal Grandmother    Thyroid Disease Sister            Social History     Socioeconomic History   . Marital status: Divorced     Spouse name: Not on file   . Number of children: Not on file   . Years of education: Not on file   . Highest education level: Not on file   Occupational History   . 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 Smoking status: Former Smoker     Packs/day: 2.00     Years: 40.00     Pack  years: 80.00     Types: Cigarettes     Last attempt to quit: 01/22/2005     Years since quitting: 13.8   . Smokeless tobacco: Never Used   Substance and Sexual Activity   . Alcohol use: No     Alcohol/week: 0.0 standard drinks   . Drug use: No   Other Topics Concern   . Uses Cane No   . Uses walker No   . Uses wheelchair No   . Right hand dominant Yes   . Left hand dominant No   . Ambidextrous No   . Ability to Walk 1 Flight of Steps without SOB/CP Yes   . Routine Exercise Yes     Comment: walk   . Ability to Walk 2 Flight of Steps without SOB/CP Yes   . Ability To Do Own ADL's Yes   . Shift Work No   . Unusual Sleep-Wake Schedule No       Review of Systems  Other than ROS in the HPI, all other systems were negative.    Examination:    Vitals: BP 126/84   Pulse 73   Ht 1.654 m (5' 5.12")   Wt 94.3 kg (207 lb 14.3 oz)   SpO2 96%   BMI 34.47 kg/m       General: appears in good health  HEENT: Atraumatic, no pallor, no icterus.  NECK: No nuchal rigidity.  MUSCULOSKELETAL: Normal.  SKIN: No rashes.  CARDIOVASCULAR: S1 and S2 heard. No murmurs noted.  RESPIRATORY: Bilateral bronchovascular breath sounds appreciated. No crackles or wheezes.  NEUROLOGIC: Comprehensive neurologic exam was performed. She is alert and oriented to time, place, and person. Good recall. Good registration. Normal episodic, semantic, and procedural memory. No hallucinations or delusions. Mood was congruent.Sppech is fluent and comprehensive.Fund of knowledge intact.  CRANIAL NERVES Ophthalmoscopic: fundus exam showed no disc swelling. Visual acuity 20/30 without correction. Visual fields were normal. Pupils arereactive to light. There is no INO no APD.Extraocular movements, normal full versions and ductions with normal pursuits and saccades. Normal facial sensation and strength in V1, V2, and V3 area. No dysarthria. Muscles of mastication normal. Tongue movements were normal. Normal hearing to finger rub. Cranial nerve 9  and 10 is intact gag reflex intact.Palate elevates in midline. Uvula in center.   MOTOR: Gait was normal with normal arm swing and stride. Rapid alternating movement was intact. Strength 5/5 in both upper and lower extremity. Wrist  extension on the right, she has a component of give-away weakness due to pain. Again, on right shoulder abduction, she complained of give-away weakness due to pain, otherwise motor and sensory 5/5 throughout. There is no muscle wasting noted. Tone and bulk appear normal. Abductor pollicis brevis muscle strength is 4/5 on the right side as compared to the left. Palmar and dorsal interossei muscle strength is 5/5 bilaterally.   Tone and bulk appear normal. Muscle Tone: Normal  COORDINATION:intact for finger to nose test, heel to shin test, tandem walk intact.  Muscle exam  Arm Right Left Leg Right Left   Deltoid 5/5 5/5 Iliopsoas 5/5 5/5   Biceps 5/5 5/5 Quads 5/5 5/5   Triceps 5/5 5/5 Hamstrings 5/5 5/5   Wrist Extension 5/5 5/5 Ankle Dorsi Flexion 5/5 5/5   Wrist Flexion 5/5 5/5 Ankle Plantar Flexion 5/5 5/5   Interossei 4/5 5/5 Ankle Eversion 5/5 5/5   APB 4/5 5/5 Ankle Inversion 5/5 5/5       Reflexes   RJ BJ TJ KJ AJ Plantars Hoffman's   Right 2+ 2+ 2+ 3+ 2+ downgoing Not present   Left 2+ 2+ 2+ 3+ 2+ downgoing Not present   Sensory examination was intact for light touch, pinprick.vibration sensation and position sense was intact.      I have reviewed the following: images    Assessment and Plan    1. Complex regional pain syndrome type 2 of right upper extremity    2. Cervical neck pain with evidence of disc disease    3. Neck pain    4. Carpal tunnel syndrome of right wrist      Orders Placed This Encounter   . Referral to Neurosurgery   . External Referral to Physical Therapy   . gabapentin (NEURONTIN) 600 mg Oral Tablet     ASSESSMENT AND PLAN:  [name] is a 63 year old right-handed Caucasian woman with a significant history of parotid gland tumor s/p  surgery done and history of depression. She comes for follow up visit the complaint of pain in the right forearm pain associated with swelling. These symptoms have been ongoing for over a year.      On the last visit, we recommend that the patient get an MRI imaging of the cervical spine, which was done from August 2019, which showed disk bulge at C5-C6-C6-C7 indenting the thecal sac without significant cord impingement.  We are going to get a neurosurgical referral for the patient to look for any recommendation for hercervical disc disease with impingement on thecal sac.  We are also going to give her a script for the physical therapy.      We are going to increase the dose of gabapentin at mg 600mg  two times a day and after 1 week increase and maintain at 600mg  three times a day.Side effects of medication including weight gain ,sedation was discussed.   We will see the patient in 3-4 months or as needed.  The patient agrees with the plan.   Total face-to-face time by staff:  40 minutes. Greater than 50% of that time (25 minutes) was spent on counseling/coordination of care regarding:ceevical radiculopathy,periepheral neuropathy,compelx regional pain syndrome,tenosynovisits,  treatment ,counselling ,mri imaging study     Catalina Gravel, MD 12/06/2018, 15:17

## 2018-12-18 ENCOUNTER — Other Ambulatory Visit (HOSPITAL_BASED_OUTPATIENT_CLINIC_OR_DEPARTMENT_OTHER): Payer: Self-pay | Admitting: Family Medicine

## 2018-12-18 DIAGNOSIS — M7671 Peroneal tendinitis, right leg: Secondary | ICD-10-CM

## 2018-12-21 ENCOUNTER — Other Ambulatory Visit (HOSPITAL_BASED_OUTPATIENT_CLINIC_OR_DEPARTMENT_OTHER): Payer: Self-pay | Admitting: Family Medicine

## 2018-12-21 DIAGNOSIS — E559 Vitamin D deficiency, unspecified: Secondary | ICD-10-CM

## 2019-01-05 ENCOUNTER — Encounter (HOSPITAL_COMMUNITY): Payer: Self-pay

## 2019-01-05 ENCOUNTER — Ambulatory Visit (INDEPENDENT_AMBULATORY_CARE_PROVIDER_SITE_OTHER): Payer: MEDICAID | Admitting: Rheumatology

## 2019-01-05 ENCOUNTER — Ambulatory Visit
Admission: RE | Admit: 2019-01-05 | Discharge: 2019-01-05 | Disposition: A | Discharge status: Home / Self Care | Payer: MEDICAID | Source: Ambulatory Visit | Attending: NURSE PRACTITIONER, FAMILY | Admitting: NURSE PRACTITIONER, FAMILY

## 2019-01-05 ENCOUNTER — Other Ambulatory Visit: Payer: Self-pay

## 2019-01-05 ENCOUNTER — Ambulatory Visit (HOSPITAL_COMMUNITY)
Admission: RE | Admit: 2019-01-05 | Discharge: 2019-01-05 | Disposition: A | Discharge status: Home / Self Care | Payer: MEDICAID | Source: Ambulatory Visit

## 2019-01-05 VITALS — BP 126/82 | HR 84 | Temp 97.4°F | Resp 16 | Ht 64.84 in | Wt 211.2 lb

## 2019-01-05 DIAGNOSIS — E042 Nontoxic multinodular goiter: Secondary | ICD-10-CM

## 2019-01-05 DIAGNOSIS — Z01818 Encounter for other preprocedural examination: Secondary | ICD-10-CM

## 2019-01-05 HISTORY — DX: Other forms of dyspnea: R06.09

## 2019-01-05 HISTORY — DX: Localized edema: R60.0

## 2019-01-05 HISTORY — DX: Rash and other nonspecific skin eruption: R21

## 2019-01-05 HISTORY — DX: Headache, unspecified: R51.9

## 2019-01-05 HISTORY — DX: Obesity, unspecified: E66.9

## 2019-01-05 HISTORY — DX: Anxiety disorder, unspecified: F41.9

## 2019-01-05 HISTORY — DX: Muscle weakness (generalized): M62.81

## 2019-01-05 HISTORY — DX: Dyspnea, unspecified: R06.00

## 2019-01-05 HISTORY — DX: Edema, unspecified: R60.9

## 2019-01-05 LAB — BASIC METABOLIC PANEL
ANION GAP: 8 mmol/L (ref 4–13)
BUN/CREA RATIO: 11 (ref 6–22)
BUN: 12 mg/dL (ref 8–25)
CALCIUM: 9.8 mg/dL (ref 8.5–10.2)
CHLORIDE: 105 mmol/L (ref 96–111)
CO2 TOTAL: 26 mmol/L (ref 22–32)
CREATININE: 1.05 mg/dL (ref 0.49–1.10)
ESTIMATED GFR: 57 mL/min/{1.73_m2} — ABNORMAL LOW (ref >60)
GLUCOSE: 94 mg/dL (ref 65–139)
POTASSIUM: 4.5 mmol/L (ref 3.5–5.1)
SODIUM: 139 mmol/L (ref 136–145)

## 2019-01-05 LAB — CBC
HCT: 44.3 % (ref 34.8–46.0)
HGB: 14.4 g/dL (ref 11.5–16.0)
MCH: 28.5 pg (ref 26.0–32.0)
MCHC: 32.5 g/dL (ref 31.0–35.5)
MCV: 87.5 fL (ref 78.0–100.0)
MPV: 8.9 fL (ref 8.7–12.5)
PLATELETS: 393 10*3/uL (ref 150–400)
RBC: 5.06 10*6/uL (ref 3.85–5.22)
RDW-CV: 14.6 % (ref 11.5–15.5)
RDW-CV: 14.6 % (ref 11.5–15.5)
WBC: 6.6 10*3/uL (ref 3.7–11.0)

## 2019-01-05 LAB — TYPE AND SCREEN
ABO/RH(D): B POS
ANTIBODY SCREEN: NEGATIVE

## 2019-01-05 LAB — POC BLOOD GLUCOSE (RESULTS): GLUCOSE, POC: 108 mg/dL — ABNORMAL HIGH (ref 70–105)

## 2019-01-14 ENCOUNTER — Other Ambulatory Visit (HOSPITAL_BASED_OUTPATIENT_CLINIC_OR_DEPARTMENT_OTHER): Payer: Self-pay | Admitting: Family Medicine

## 2019-01-14 DIAGNOSIS — M7671 Peroneal tendinitis, right leg: Secondary | ICD-10-CM

## 2019-01-15 ENCOUNTER — Encounter (INDEPENDENT_AMBULATORY_CARE_PROVIDER_SITE_OTHER): Payer: Self-pay

## 2019-01-15 ENCOUNTER — Other Ambulatory Visit: Payer: Self-pay

## 2019-01-15 ENCOUNTER — Ambulatory Visit (INDEPENDENT_AMBULATORY_CARE_PROVIDER_SITE_OTHER): Payer: MEDICAID

## 2019-01-15 ENCOUNTER — Ambulatory Visit (HOSPITAL_COMMUNITY): Payer: Self-pay

## 2019-01-15 VITALS — BP 137/81 | HR 88 | Temp 98.6°F | Resp 16 | Ht 65.0 in | Wt 212.1 lb

## 2019-01-15 DIAGNOSIS — R05 Cough: Principal | ICD-10-CM

## 2019-01-15 DIAGNOSIS — R059 Cough, unspecified: Secondary | ICD-10-CM

## 2019-01-15 DIAGNOSIS — J019 Acute sinusitis, unspecified: Secondary | ICD-10-CM

## 2019-01-15 MED ORDER — AMOXICILLIN 875 MG-POTASSIUM CLAVULANATE 125 MG TABLET
1.00 | ORAL_TABLET | Freq: Two times a day (BID) | ORAL | 0 refills | Status: AC
Start: 2019-01-15 — End: 2019-01-22

## 2019-01-15 NOTE — Progress Notes (Signed)
[facility]gent [facility]  [place] [address]  [id number]    Attending physician: Ricci Barker, MD  Provider: Ronni Rumble, MD      History of Present Illness: [name] is a 63 y.o. female who presents to the [facility] today with chief complaint of    Chief Complaint            Sinus Pressure sinus pain x 3 days    Facial Pain right side pain       .     HPI:   Patient reports having pain into the right side of her face and behind her right ear which started 3 days ago. She denies any runny nose but reports nasal  Congestion. She feels she has a sinus infection. She denies any sore throat. No fever. Occasional cough.    She denies any chest pain, shortness of breath, nausea, vomiting, or diarrhea.    She has been using Flonase nasal spray.    Her past medical hx is significant for hypertension, GERD, and anxiety and depression.    Review of Systems:    General: no fever and no myalgias  ENT:  no sore throat and nasal congestion, right sided facial pressure, and right ear fullness  Eyes:  no pain, no redness and no visual change  Pulmonary:   no wheezing, no SOB and occasional cough  Cardiovascular:  no chest pain  Gastrointestinal:  no nausea, no vomiting and no diarrhea  Neurologic:  headache  Skin:  no rash  Heme/Lymph:  no swollen lymph glands    I reviewed and confirmed the patient's past medical history taken by the nurse or medical assistant with the addition of the following:    Past Medical History:    Past Medical History:   Diagnosis Date   . Anxiety    . Arthritis    . Cancer (CMS HCC)     internal skin cancer   . Depression    . Dyspnea on exertion    . Essential hypertension 02/16/2017    patient denies   . GERD (gastroesophageal reflux disease)     controlled w/med   . Headache    . Heart murmur 1979    benign   . Hyperlipidemia LDL goal < 100 08/29/2013   . MINOR CAD (coronary artery disease) 11/10/2012    no treatment other than cholesterol  medications; follows regularly with Dr. Rodney Booze   . Multinodular goiter    . Muscle weakness     neck    . Obesity    . Peripheral edema    . Rash     face only eczema   . Rheumatic fever 16    no complications   . Squamous cell carcinoma 02/06/2015    right side of neck   . Vaginal prolapse 2012    surgery improved significantly   . Wears glasses          Past Surgical History:    Past Surgical History:   Procedure Laterality Date   . COLONOSCOPY  01/06/09    COLONOSCOPY performed by Loletta Parish F at [place]   . GASTROSCOPY  03/05/2010    GASTROSCOPY performed by Jocelyn Lamer at [place]   . GASTROSCOPY WITH BIOPSY  03/05/2010    GASTROSCOPY WITH BIOPSY performed by Jocelyn Lamer at [place]   . HX ADENOIDECTOMY     . HX ANKLE FRACTURE  [place]  2007    left distal fibula, casted   . HX APPENDECTOMY     . HX CHOLECYSTECTOMY     . HX COLONOSCOPY     . HX CYSTOCELE REPAIR  09/24/09   . HX HAND SURGERY  2012    for Carpal tunnel    . HX OOPHORECTOMY      left ovary removed   . HX TONSILLECTOMY     . HX TOTAL VAGINAL HYSTERECTOMY  1979   . HX WRIST FRACTURE [place]  2007    [place] injury   . PB REVISE ULNAR NERVE AT ELBOW Left 1979   . PB UPPER GI ENDOSCOPY,BIOPSY  12/19/07    patulous GE junction zone, erythema, nonerosive GERD         Allergies:  Allergies   Allergen Reactions   . Cymbalta [Duloxetine]      Rash on feet and hands   . Blue Dye      Told to avoid due to patch test result by Simone Curia   . Flavoring Agent    . Lipitor [Atorvastatin] Myalgia     Muscle pain   . Nickel      Blisters     . Codeine Itching     RASH   . Mobic [Meloxicam] Nausea/ Vomiting     Nervous and shakes    . Naprosyn [Naproxen] Nausea/ Vomiting   . Nsaids (Non-Steroidal Anti-Inflammatory Drug) Nausea/ Vomiting     Medications:    Current Outpatient Medications   Medication Sig   . acetaminophen (TYLENOL) 325 mg Oral Tablet Take 2 Tabs (650 mg total) by mouth Three times a day (Patient taking differently: Take 650 mg by mouth Every 4 hours  as needed )   . amoxicillin-pot clavulanate (AUGMENTIN) 875-125 mg Oral Tablet Take 1 Tab by mouth Every 12 hours for 7 days   . aspirin (ECOTRIN) 81 mg Oral Tablet, Delayed Release (E.C.) Take 81 mg by mouth Once a day   . Azelaic Acid 20 % Cream Apply topically Twice daily (Patient taking differently: Apply topically Twice per day as needed )   . cholecalciferol, vitamin D3, 25 mcg (1,000 unit) Oral Tablet Take 1 Tab (1,000 Units total) by mouth Once a day   . Clobetasol (TEMOVATE) 0.05 % Ointment Apply topically Twice daily To rash on hands and feet (Patient taking differently: Apply topically Twice per day as needed To rash on hands and feet)   . clobetasoL (TEMOVATE) 0.05 % Ointment Apply topically   . diclofenac sodium (VOLTAREN) 75 mg Oral Tablet, Delayed Release (E.C.) Take 1 Tab (75 mg total) by mouth Once per day as needed (Patient not taking: Reported on 01/15/2019)   . FLUoxetine (SARAFEM) 20 mg Oral Tablet Take 1 Tab (20 mg total) by mouth Twice daily   . gabapentin (NEURONTIN) 600 mg Oral Tablet Take 1 Tab (600 mg total) by mouth Twice daily   . Halobetasol Propionate (ULTRAVATE) 0.05 % Cream Apply  topically. Three times a week (Patient taking differently: Twice per day as needed Apply  topically. Three times a week)   . Halobetasol Propionate (ULTRAVATE) 0.05 % Cream Apply topically   . Metronidazole (METROCREAM) 0.75 % Cream Apply topically Twice daily (Patient taking differently: Apply topically Twice per day as needed )   . Metronidazole (METROCREAM) 0.75 % Cream Apply topically   . mineral oil-hydrophil petrolat (AQUAPHOR) Ointment Apply topically   . nitroGLYCERIN (NITROSTAT) 0.4 mg Sublingual Tablet, Sublingual 1 Tab (0.4 mg  total) by Sublingual route Every 5 minutes as needed for Chest pain for up to 3 doses for 3 doses over 15 minutes   . nystatin (NYSTOP) 100,000 unit/gram Powder Apply once or twice daily to PREVENT rash under breasts   . nystatin (NYSTOP) 100,000 unit/gram Powder Apply 1 g  topically   . pantoprazole (PROTONIX) 40 mg Oral Tablet, Delayed Release (E.C.) Take 1 Tab (40 mg total) by mouth Every morning before breakfast   . tolterodine (DETROL) 2 mg Oral Tablet Take 2 mg by mouth Once a day   . traZODone (DESYREL) 50 mg Oral Tablet Take 1/2 to full tablet as needed for sleep.   Marland Kitchen triamcinolone acetonide (ARISTOCORT A) 0.1 % Ointment by Apply Topically route Twice daily (Patient taking differently: by Apply Topically route Twice per day as needed )   . triamcinolone acetonide (ARISTOCORT A) 0.1 % Ointment Apply topically     Social History:    Social History     Tobacco Use   . Smoking status: Former Smoker     Packs/day: 2.00     Years: 40.00     Pack years: 80.00     Types: Cigarettes     Last attempt to quit: 01/22/2005     Years since quitting: 13.9   . Smokeless tobacco: Never Used   Substance Use Topics   . Alcohol use: No     Alcohol/week: 0.0 standard drinks   . Drug use: No     Family History: No significant family history.  Family Medical History:     Problem Relation (Age of Onset)    Bipolar Disorder Daughter    Breast Cancer Paternal Aunt, Paternal 65, Paternal 54, Paternal 5, Paternal 31, Other    Cancer Paternal Aunt, Paternal 9, Paternal 71, Paternal 40, Paternal 18, Other (22)    Congestive Heart Failure Father (107)    Coronary Artery Disease Father, Mother (33)    Diabetes Mother    Heart Attack Father, Sister    High Cholesterol Mother    Hypertension (High Blood Pressure) Mother, Brother, Sister, Sister    Kidney Disease Sister    Leukemia Paternal Uncle, Paternal Uncle    Stroke Paternal Grandmother    Thyroid Disease Sister              Physical Exam:  Vital signs:   Vitals:    01/15/19 1325   BP: 137/81   Pulse: 88   Resp: 16   Temp: 37 C (98.6 F)   TempSrc: Tympanic   SpO2: 98%   Weight: 96.2 kg (212 lb 1.3 oz)   Height: 1.651 m (5\' 5" )   BMI: 35.37           General:  appears acutely ill, no distress  Head:  NC AT, right maxillary and frontal  sinus TTP  Eyes:  PERRL, EOMI and normal conjunctiva  ENT:  normal TM's, normal pharynx/tonsils and nasal mucosa erythematous  Neck:  supple and no lymphadenopathy  Pulmonary:  clear to auscultation bilaterally and no wheezes  Cardiovascular:  regular rate/rhythm, normal S1/S2 and no murmur/rub/gallop  Skin:  warm/dry and no rash  Psychiatric:  Appropriate affect and behavior  Neurologic:   Alert and oriented x 3  Hem/Lymph:  No lymphadenopathy    Data Reviewed:    Not applicable    Course: Condition at discharge: Good     Differential Diagnosis: Acute sinusitis vs viral URI vs allergies    Assessment:   1. Acute  sinusitis    This is a 63yr old female with 3 day history of right sided facial pressure and nasal congestion. No fever. Exam with erythematous nasal mucosa and maxillary and frontal sinuses TTP consistent with acute sinusitis.    Plan:    -Rx for Augmentin 875-125mg  BID x 7 days  -Continue flonase  -Discussed supportive measures with OTC decongestants as needed    Orders Placed This Encounter   . amoxicillin-pot clavulanate (AUGMENTIN) 875-125 mg Oral Tablet             Current Discharge Medication List          Accurate as of January 15, 2019  2:00 PM. If you have any questions, ask your nurse or doctor.            START taking these medications.      Details   amoxicillin-pot clavulanate 875-125 mg Tablet  Commonly known as:  Augmentin  Started by:  [facility]   1 Tab, Oral, EVERY 12 HOURS  Qty:  14 Tab  Refills:  0        CONTINUE these medications which have CHANGED during your visit.      Details   acetaminophen 325 mg Tablet  Commonly known as:  TYLENOL  What changed:     when to take this   reasons to take this   650 mg, Oral, 3 TIMES DAILY  Qty:  180 Tab  Refills:  0     Azelaic Acid 20 % Cream  What changed:     when to take this   reasons to take this   Topical, 2 TIMES DAILY  Qty:  50 g  Refills:  0     * clobetasoL 0.05 % Ointment  Commonly known as:  TEMOVATE  What changed:  Another  medication with the same name was changed. Make sure you understand how and when to take each.   Topical  Refills:  0     * clobetasoL 0.05 % Ointment  Commonly known as:  TEMOVATE  What changed:     when to take this   reasons to take this   Topical, 2 TIMES DAILY, To rash on hands and feet  Qty:  60 g  Refills:  0     * Halobetasol Propionate 0.05 % Cream  Commonly known as:  ULTRAVATE  What changed:  Another medication with the same name was changed. Make sure you understand how and when to take each.   Topical  Refills:  0     * Halobetasol Propionate 0.05 % Cream  Commonly known as:  ULTRAVATE  What changed:     when to take this   reasons to take this   Apply  topically. Three times a week  Qty:  15 g  Refills:  2     * Metronidazole 0.75 % Cream  Commonly known as:  METROCREAM  What changed:  Another medication with the same name was changed. Make sure you understand how and when to take each.   Topical  Refills:  0     * Metronidazole 0.75 % Cream  Commonly known as:  METROCREAM  What changed:     when to take this   reasons to take this   Topical, 2 TIMES DAILY  Qty:  45 g  Refills:  1     * triamcinolone acetonide 0.1 % Ointment  Commonly known as:  ARISTOCORT A  What changed:  Another medication with the same name was changed. Make sure you understand how and when to take each.   Topical  Refills:  0     * triamcinolone acetonide 0.1 % Ointment  Commonly known as:  ARISTOCORT A  What changed:     when to take this   reasons to take this   Apply Topically, 2 TIMES DAILY  Qty:  80 g  Refills:  2         * This list has 8 medication(s) that are the same as other medications prescribed for you. Read the directions carefully, and ask your doctor or other care provider to review them with you.            CONTINUE these medications - NO CHANGES were made during your visit.      Details   Aquaphor Ointment  Generic drug:  mineral oil-hydrophil petrolat   Topical  Refills:  0     aspirin 81 mg Tablet,  Delayed Release (E.C.)  Commonly known as:  ECOTRIN   81 mg, Oral, DAILY  Refills:  0     cholecalciferol (vitamin D3) 25 mcg (1,000 unit) Tablet   1,000 Units, Oral, DAILY  Qty:  30 Tab  Refills:  3     diclofenac sodium 75 mg Tablet, Delayed Release (E.C.)  Commonly known as:  VOLTAREN   75 mg, Oral, DAILY PRN  Qty:  30 Tab  Refills:  0     FLUoxetine 20 mg Tablet  Commonly known as:  SARAFEM   20 mg, Oral, 2 TIMES DAILY  Qty:  60 Tab  Refills:  5     gabapentin 600 mg Tablet  Commonly known as:  NEURONTIN   600 mg, Oral, 2 TIMES DAILY  Qty:  60 Tab  Refills:  4     nitroGLYCERIN 0.4 mg Tablet, Sublingual  Commonly known as:  NITROSTAT   0.4 mg, Sublingual, EVERY 5 MIN PRN, for 3 doses over 15 minutes  Qty:  20 Tab  Refills:  0     * nystatin 100,000 unit/gram Powder  Commonly known as:  NYSTOP   1 g, Topical  Refills:  0     * nystatin 100,000 unit/gram Powder  Commonly known as:  NYSTOP   Apply once or twice daily to PREVENT rash under breasts  Qty:  60 g  Refills:  11     pantoprazole 40 mg Tablet, Delayed Release (E.C.)  Commonly known as:  PROTONIX   40 mg, Oral, EVERY MORNING BEFORE BREAKFAST  Qty:  30 Tab  Refills:  5     tolterodine 2 mg Tablet  Commonly known as:  DETROL   2 mg, Oral, DAILY  Refills:  0     traZODone 50 mg Tablet  Commonly known as:  DESYREL   Take 1/2 to full tablet as needed for sleep.  Qty:  30 Tab  Refills:  3         * This list has 2 medication(s) that are the same as other medications prescribed for you. Read the directions carefully, and ask your doctor or other care provider to review them with you.                Go to Emergency Department immediately for further work up if any concerning symptoms.    Plan was discussed and patient verbalized understanding.  If symptoms are worsening or not improving the patient should return to the clinic for  further evaluation.      Ronni Rumble, MD 01/15/2019, 14:00     I saw and examined the patient.  I reviewed the resident's note.  I agree  with the findings and plan of care as documented in the resident's note.  Any exceptions/additions are edited/noted.    Aura Fey, MD

## 2019-01-15 NOTE — Patient Instructions (Signed)
Acute Bacterial Rhinosinusitis (ABRS)    Acute bacterial rhinosinusitis (ABRS) is an infection of your nasal cavity and sinuses. It's caused by bacteria. Acute means that you've had symptoms for less than 4 weeks, but possibly up to 12 weeks.  Understanding your sinuses  The nasal cavity is the large air-filled space behind your nose. The sinuses are a group of spaces formed by the bones of your face. They connect with your nasal cavity. ABRS causes the tissue lining these spaces to become inflamed. Mucus may not drain normally. This leads to facial pain and other symptoms.  What causes ABRS?  ABRS most often follows an upper respiratory infection caused by a virus. Bacteria then infect the lining of your nasal cavity and sinuses. But you can also get ABRS if you have:   Nasal allergies   Long-term nasal swelling and congestion not caused by allergies   Blockage in the nose  Symptoms of ABRS  The symptoms of ABRS may be different for each person and include:   Nasal congestion or blockage   Pain or pressure in the face   Thick, colored drainage from the nose  Other symptoms may include:   Runny nose   Fluid draining from the nose down the throat (postnasal drip)   Headache   Cough   Pain   Fever  Diagnosing ABRS  ABRS may be diagnosed if you've had an upper respiratory infection like a cold and cough for 10 or more days without improvement or with worsening symptoms. Your healthcare provider will ask about your symptoms and your medical history. The provider will check your vital signs, including your temperature. You'll have a physical exam. The healthcare provider will check your ears, nose, and throat. You likely won't need any tests. If ABRS comes back, you may have a culture or other tests.  Treatment for ABRS  Treatment may include:   Antibiotic medicine. This is for symptoms that last for at least 10 to 14 days.   Nasal corticosteroid medicine. Drops or spray used in the nose can lessen  swelling and congestion.   Over-the-counter pain medicine. This is to lessen sinus pain and pressure.   Nasal decongestant medicine. Spray or drops may help to lessen congestion. Do not use them for more than a few days.   Salt wash (saline irrigation). This can help to loosen mucus.  Possible complications of ABRS  ABRS may come back or become long-term (chronic). In rare cases, ABRS may cause complications such as:   Inflamed tissue around the brain and spinal cord (meningitis)   Inflamed tissue around the eyes (orbital cellulitis)   Inflamed bones around the sinuses (osteitis)  These problems may need to be treated in a hospital with intravenous (IV) antibiotic medicine or surgery.  When to call the healthcare provider  Call your healthcare provider if you have any of the following:   Symptoms that don't get better, or get worse   Symptoms that don't get better after 3 to 5 days on antibiotics   Trouble seeing   Swelling around your eyes   Confusion or trouble staying awake   [employer] last reviewed this educational content on 02/23/2016   2000-2019 [employer] [address], [place] [address]. All rights reserved. This information is not intended as a substitute for professional medical care. Always follow your healthcare professional's instructions.

## 2019-01-15 NOTE — Telephone Encounter (Signed)
Additional Information  . Negative: Difficulty breathing or unusual sweating (e.g., sweating without exertion)  . Negative: Can't close the mouth fully (i.e., "mouth is locked open", patient will have difficulty talking)  . Negative: Fever and localized red rash  . Negative: Fever and area of face is swollen  . Negative: Chest pain  . Negative: Sinus pain and congestion  . Negative: Headache or pain in upper forehead  . Negative: Toothache is the main symptom  . Negative: Followed a face injury  . Negative: Shock suspected (e.g., cold/pale/clammy skin, too weak to stand)  . Negative: Similar pain previously and it was from 'heart attack'  . Negative: Similar pain previously and it was from 'angina' and not relieved by nitroglycerin  . Negative: Sounds like a life-threatening emergency to the triager  . Negative: Patient sounds very sick or weak to the triager  . Swelling around the eye    Protocols used: FACE PAIN-ADULT-OH      COVID-19 SCREENING NOTE    Primary Care Provider: Dennison Mascot, MD     Has the patient had recent travel to/from or exposure to someone with recent travel to a COVID-19 "hot spot"? No travel, no exposure  Has the patient had recent exposure to a known case of COVID-19: No  Has the patient had fever? No  Has the patient had cough? No  Has the patient had shortness of breath? No     Patient Active Problem List   Diagnosis   . Migraine Headaches   . GERD not well controlled   . Major depressive disorder, recurrent episode (CMS HCC)   . Patellofemoral pain syndrome   . Multinodular goiter   . Microscopic hematuria   . Neck pain   . Well woman exam   . Lichen sclerosus et atrophicus of the vulva   . Carpal tunnel syndrome on right   . Osteoarthritis, knee   . Left knee pain   . Chest pain at rest   . MINOR CAD (coronary artery disease)   . Hyperlipidemia LDL goal < 100   . Otitis media of both ears   . Mass of parotid gland, right   . Squamous cell carcinoma   . Chest pain   . Dysuria   .  Urinary frequency   . Multiple pulmonary nodules determined by computed tomography of lung   . History of rheumatic fever   . Neuropathy (CMS HCC)   . PTSD (post-traumatic stress disorder)   . Recurrent major depressive disorder, in partial remission (CMS HCC)   . Essential hypertension   . Acute suppurative otitis media without spontaneous rupture of ear drum   . 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 Lung nodule < 6cm on CT       Disposition:   Patient encouraged to go to urgent care for eval and treatment for facial swelling.     Athena Masse, CLINICAL NAVIGATOR  01/15/2019, 12:16

## 2019-01-29 ENCOUNTER — Ambulatory Visit (HOSPITAL_COMMUNITY): Payer: Self-pay | Admitting: Psychiatry

## 2019-01-29 ENCOUNTER — Other Ambulatory Visit (HOSPITAL_COMMUNITY): Payer: Self-pay | Admitting: Psychiatry

## 2019-01-29 DIAGNOSIS — F331 Major depressive disorder, recurrent, moderate: Secondary | ICD-10-CM

## 2019-01-29 MED ORDER — FLUOXETINE 20 MG TABLET
20.00 mg | ORAL_TABLET | Freq: Two times a day (BID) | ORAL | 5 refills | Status: DC
Start: 2019-01-29 — End: 2019-06-28

## 2019-01-29 NOTE — Telephone Encounter (Signed)
-----   Message from [name] sent at 01/29/2019 10:29 AM EDT -----  Dr Doylene Canning     The patient called back stating she will be out of her medication after today.   Please advise the patient at [phone].   Thank you    ----- Message from [name] sent at 01/25/2019 10:19 AM EDT -----  Safire Gordin pt    Pt is calling to get a refill. Thank you.     Current Outpatient Medications:  FLUoxetine (SARAFEM) 20 mg Oral Tablet, Take 1 Tab (20 mg total) by mouth Twice daily    Preferred Pharmacy     [id number] - [place], [place] - [address]    [address]  [place] [address]    Phone: [phone] Fax: [phone]    Not a 24 hour pharmacy; exact hours not known.

## 2019-02-01 ENCOUNTER — Ambulatory Visit (HOSPITAL_COMMUNITY): Payer: Self-pay

## 2019-02-01 ENCOUNTER — Encounter (INDEPENDENT_AMBULATORY_CARE_PROVIDER_SITE_OTHER): Payer: Self-pay | Admitting: Urology

## 2019-02-01 NOTE — Telephone Encounter (Signed)
Prior Authorization    Prior Auth Approved 02/01/2019    Medication/Strength:  FLUoxetine (SARAFEM) 20 mg Oral Tablet 60 Tab 5 01/29/2019   Sig - Route: Take 1 Tab (20 mg total) by mouth Twice daily     Insurance:[place] Medicaid #[id number]    Authorization through:   02/01/20    Trixie Rude, CASE MANAGER  02/01/2019, 10:15

## 2019-02-01 NOTE — Telephone Encounter (Signed)
-----   Message from [facility] [place] sent at 02/01/2019  9:46 AM EDT -----  Regarding: FW: prior Josem Kaufmann - [place] pt  ----- Message from [name] sent at 02/01/2019  9:45 AM EDT -----  Dr Doylene Canning pt    [facility] called stating the pt's insurance will not pay for her to take two tablets of Fluoxetine daily.  She is asking if you would do a prior auth due to the insurance limit.   Please contact the pharmacy at [phone].   Thank you    [facility] Medicaid  #[id number]      FLUoxetine (SARAFEM) 20 mg Oral Tablet 60 Tab 5 01/29/2019    Sig - Route: Take 1 Tab (20 mg total) by mouth Twice daily       Preferred Pharmacy     [id number] - [place], [place] - [address]    [address] [place] [address]    Phone: [phone] Fax: [phone]    Not a 24 hour pharmacy; exact hours not known.

## 2019-02-02 ENCOUNTER — Encounter (INDEPENDENT_AMBULATORY_CARE_PROVIDER_SITE_OTHER): Payer: Self-pay | Admitting: Neurological Surgery

## 2019-02-02 ENCOUNTER — Ambulatory Visit (HOSPITAL_COMMUNITY): Payer: Self-pay

## 2019-02-02 ENCOUNTER — Other Ambulatory Visit: Payer: Self-pay

## 2019-02-02 ENCOUNTER — Ambulatory Visit: Payer: MEDICAID | Attending: Neurological Surgery | Admitting: Neurological Surgery

## 2019-02-02 ENCOUNTER — Telehealth: Payer: MEDICAID | Admitting: Psychiatry

## 2019-02-02 VITALS — BP 128/88 | HR 101 | Temp 97.1°F | Ht 66.1 in | Wt 193.3 lb

## 2019-02-02 DIAGNOSIS — M50122 Cervical disc disorder at C5-C6 level with radiculopathy: Secondary | ICD-10-CM | POA: Insufficient documentation

## 2019-02-02 DIAGNOSIS — F4310 Post-traumatic stress disorder, unspecified: Secondary | ICD-10-CM

## 2019-02-02 DIAGNOSIS — E041 Nontoxic single thyroid nodule: Secondary | ICD-10-CM | POA: Insufficient documentation

## 2019-02-02 DIAGNOSIS — G5601 Carpal tunnel syndrome, right upper limb: Secondary | ICD-10-CM | POA: Insufficient documentation

## 2019-02-02 DIAGNOSIS — G4700 Insomnia, unspecified: Secondary | ICD-10-CM

## 2019-02-02 DIAGNOSIS — Z87891 Personal history of nicotine dependence: Secondary | ICD-10-CM | POA: Insufficient documentation

## 2019-02-02 DIAGNOSIS — M62830 Muscle spasm of back: Secondary | ICD-10-CM | POA: Insufficient documentation

## 2019-02-02 DIAGNOSIS — M4722 Other spondylosis with radiculopathy, cervical region: Secondary | ICD-10-CM | POA: Insufficient documentation

## 2019-02-02 DIAGNOSIS — F419 Anxiety disorder, unspecified: Secondary | ICD-10-CM

## 2019-02-02 DIAGNOSIS — M5090 Cervical disc disorder, unspecified, unspecified cervical region: Secondary | ICD-10-CM

## 2019-02-02 DIAGNOSIS — F331 Major depressive disorder, recurrent, moderate: Secondary | ICD-10-CM

## 2019-02-02 DIAGNOSIS — G5641 Causalgia of right upper limb: Secondary | ICD-10-CM | POA: Insufficient documentation

## 2019-02-02 MED ORDER — HYDROXYZINE HCL 50 MG TABLET
50.0000 mg | ORAL_TABLET | Freq: Every evening | ORAL | 1 refills | Status: DC
Start: 2019-02-02 — End: 2019-03-26

## 2019-02-02 MED ORDER — METHOCARBAMOL 500 MG TABLET
500.00 mg | ORAL_TABLET | Freq: Four times a day (QID) | ORAL | 0 refills | Status: DC
Start: 2019-02-02 — End: 2020-07-12

## 2019-02-02 NOTE — H&P (Signed)
[facility] Department of Neurosurgery  New Outpatient/Consult    [name]  Date of Service: 02/02/2019  Referring physician: Catalina Gravel, MD  [place]  [place],  [id number]    Gender: female    Chief Complaint:   Chief Complaint   Patient presents with   . New Patient     Cerv disc disease w/MRI co 12/06/18     History is provided by patient, health care provider and history reviewed via medical record    History of Present Illness  (Location/Quality/Severity/Duration/Timing/Context/Modifying factors/Associated S&S)    [name] is a 62 y.o. female who presents with neck pain with RLE radiculopathy.    Onset: June 2019; was "moving stuff around". No big accident.  Location: neck pain   Initially was pain from elbow into all fingers. Then it moved up to incorporate shoulder and neck.   Worst pain is back of neck to the shoulder/scapula; "feel stiff"   At times gives occipital headaches  Duration: constant  Characteristics: achy in neck; shooting, tingling, burning pain RUE at times  Aggravating factors: weather/temperature change, exercise  Alleviating factors: heat  Associated symptoms:    Denies any focal weakness as from neck.   Denies any bowel/bladder incontinence or urinary retention. Has overactive bladder.  Physical therapy or chiropractor: has not done PT (has script)  Medications: gabapentin TID, advil  Injections: never  TENS unit: used it in past on lower back but not neck  Disability or social security benefits: social security 2/2 depression  EMG 05/16/18: no radiculopathy in right arm; no R medium or ulnar neuropathy  Goal: to be able to turn head without neck pain  Prior CTR on R.    Past History  Current Outpatient Medications   Medication Sig   . acetaminophen (TYLENOL) 325 mg Oral Tablet Take 2 Tabs (650 mg total) by mouth Three times a day (Patient taking differently: Take 650 mg by mouth Every 4 hours as needed )   . aspirin (ECOTRIN) 81 mg Oral Tablet,  Delayed Release (E.C.) Take 81 mg by mouth Once a day   . Azelaic Acid 20 % Cream Apply topically Twice daily (Patient taking differently: Apply topically Twice per day as needed )   . cholecalciferol, vitamin D3, 25 mcg (1,000 unit) Oral Tablet Take 1 Tab (1,000 Units total) by mouth Once a day   . Clobetasol (TEMOVATE) 0.05 % Ointment Apply topically Twice daily To rash on hands and feet (Patient taking differently: Apply topically Twice per day as needed To rash on hands and feet)   . clobetasoL (TEMOVATE) 0.05 % Ointment Apply topically   . diclofenac sodium (VOLTAREN) 75 mg Oral Tablet, Delayed Release (E.C.) Take 1 Tab (75 mg total) by mouth Once per day as needed (Patient not taking: Reported on 01/15/2019)   . FLUoxetine (SARAFEM) 20 mg Oral Tablet Take 1 Tab (20 mg total) by mouth Twice daily   . gabapentin (NEURONTIN) 600 mg Oral Tablet Take 1 Tab (600 mg total) by mouth Twice daily   . Halobetasol Propionate (ULTRAVATE) 0.05 % Cream Apply  topically. Three times a week (Patient taking differently: Twice per day as needed Apply  topically. Three times a week)   . Halobetasol Propionate (ULTRAVATE) 0.05 % Cream Apply topically   . hydrOXYzine HCL (ATARAX) 50 mg Oral Tablet Take 1 Tab (50 mg total) by mouth Every night   . methocarbamoL (ROBAXIN) 500 mg Oral Tablet Take 1 Tab (  500 mg total) by mouth Four times a day For muscle spasms   . Metronidazole (METROCREAM) 0.75 % Cream Apply topically Twice daily (Patient taking differently: Apply topically Twice per day as needed )   . Metronidazole (METROCREAM) 0.75 % Cream Apply topically   . mineral oil-hydrophil petrolat (AQUAPHOR) Ointment Apply topically   . nitroGLYCERIN (NITROSTAT) 0.4 mg Sublingual Tablet, Sublingual 1 Tab (0.4 mg total) by Sublingual route Every 5 minutes as needed for Chest pain for up to 3 doses for 3 doses over 15 minutes   . nystatin (NYSTOP) 100,000 unit/gram Powder Apply once or twice daily to PREVENT rash under breasts   . nystatin  (NYSTOP) 100,000 unit/gram Powder Apply 1 g topically   . pantoprazole (PROTONIX) 40 mg Oral Tablet, Delayed Release (E.C.) Take 1 Tab (40 mg total) by mouth Every morning before breakfast   . tolterodine (DETROL) 2 mg Oral Tablet Take 2 mg by mouth Once a day   . traZODone (DESYREL) 50 mg Oral Tablet Take 1/2 to full tablet as needed for sleep. (Patient not taking: Reported on 02/02/2019)   . triamcinolone acetonide (ARISTOCORT A) 0.1 % Ointment by Apply Topically route Twice daily (Patient taking differently: by Apply Topically route Twice per day as needed )   . triamcinolone acetonide (ARISTOCORT A) 0.1 % Ointment Apply topically     Allergies   Allergen Reactions   . Cymbalta [Duloxetine]      Rash on feet and hands   . Blue Dye      Told to avoid due to patch test result by [name]   . Flavoring Agent    . Lipitor [Atorvastatin] Myalgia     Muscle pain   . Nickel      Blisters     . Codeine Itching     RASH   . Mobic [Meloxicam] Nausea/ Vomiting     Nervous and shakes    . Naprosyn [Naproxen] Nausea/ Vomiting   . Nsaids (Non-Steroidal Anti-Inflammatory Drug) Nausea/ Vomiting     Past Medical History:   Diagnosis Date   . Anxiety    . Arthritis    . Cancer (CMS HCC)     internal skin cancer   . Depression    . Dyspnea on exertion    . Essential hypertension 02/16/2017    patient denies   . GERD (gastroesophageal reflux disease)     controlled w/med   . Headache    . Heart murmur 1979    benign   . Hyperlipidemia LDL goal < 100 08/29/2013   . MINOR CAD (coronary artery disease) 11/10/2012    no treatment other than cholesterol medications; follows regularly with Dr. Rodney Gardner   . Multinodular goiter    . Muscle weakness     neck    . Obesity    . Peripheral edema    . Rash     face only eczema   . Rheumatic fever 17    no complications   . Squamous cell carcinoma 02/06/2015    right side of neck   . Vaginal prolapse 2012    surgery improved significantly   . Wears glasses          Past Surgical History:      Procedure Laterality Date   . COLONOSCOPY  01/06/09    COLONOSCOPY performed by [name] at [facility]   . GASTROSCOPY  03/05/2010    GASTROSCOPY performed by Jocelyn Lamer at [facility]   .  GASTROSCOPY WITH BIOPSY  03/05/2010    GASTROSCOPY WITH BIOPSY performed by Jocelyn Lamer at [facility]   . HX ADENOIDECTOMY     . HX ANKLE FRACTURE [place]  2007    left distal fibula, casted   . HX APPENDECTOMY     . HX CHOLECYSTECTOMY     . HX COLONOSCOPY     . HX CYSTOCELE REPAIR  09/24/09   . HX HAND SURGERY  2012    for Carpal tunnel    . HX OOPHORECTOMY      left ovary removed   . HX TONSILLECTOMY     . HX TOTAL VAGINAL HYSTERECTOMY  1979   . HX WRIST FRACTURE [place]  2007    [facility] injury   . PB REVISE ULNAR NERVE AT ELBOW Left 1979   . PB UPPER GI ENDOSCOPY,BIOPSY  12/19/07    patulous GE junction zone, erythema, nonerosive GERD           Family History  Family Medical History:     Problem Relation (Age of Onset)    Bipolar Disorder Daughter    Breast Cancer Paternal Aunt, Paternal Aunt, Paternal Aunt, Paternal Aunt, Paternal 23, Other    Cancer Paternal Aunt, Paternal 75, Paternal 26, Paternal 23, Paternal 43, Other (38)    Congestive Heart Failure Father (66)    Coronary Artery Disease Father, Mother (16)    Diabetes Mother    Heart Attack Father, Sister    High Cholesterol Mother    Hypertension (High Blood Pressure) Mother, Brother, Sister, Sister    Kidney Disease Sister    Leukemia Paternal Uncle, Paternal Uncle    Stroke Paternal Grandmother    Thyroid Disease Sister              Social History  Social History     Socioeconomic History   . Marital status: Divorced     Spouse name: Not on file   . Number of children: Not on file   . Years of education: Not on file   . Highest education level: Not on file   Occupational History   . 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 Smoking status: Former Smoker     Packs/day: 2.00     Years: 40.00     Pack  years: 80.00     Types: Cigarettes     Last attempt to quit: 01/22/2005     Years since quitting: 14.0   . Smokeless tobacco: Never Used   Substance and Sexual Activity   . Alcohol use: No     Alcohol/week: 0.0 standard drinks   . Drug use: No   . Sexual activity: Yes     Partners: Male   Other Topics Concern   . Uses Cane No   . Uses walker No   . Uses wheelchair No   . Right hand dominant Yes   . Left hand dominant No   . Ambidextrous No   . Ability to Walk 1 Flight of Steps without SOB/CP Yes   . Routine Exercise Yes     Comment: walk   . Ability to Walk 2 Flight of Steps without SOB/CP No     Comment: shortness of breath, no cp    . Ability To Do Own ADL's Yes   . Shift Work No   . Unusual Sleep-Wake Schedule No  Review of Systems  Other than ROS in the HPI, all other systems were negative.    Examination  BP 128/88   Pulse (!) 101   Temp 36.2 C (97.1 Gardner) (Temporal)   Ht 1.679 m (5' 6.1")   Wt 87.7 kg (193 lb 5.5 oz)   SpO2 94%   BMI 31.11 kg/m         Constitutional  General appearance: Normal  Eyes: Ophthalmic exam of optic discs and posterior segments: Normal  Appears stated age, NAD  A&Ox3  GCS 4 6 5   Fluent speech  Appropriate fund of knowledge  Appropriate attention span & concentration  Appropriate recent and remote memory  Sclera white  Trachea midline  Regular respirations  Skin will perfused  Mouth symmetric  CN 2 PERRL  CN 3 4 6  EOMI  CN 5 facial sensation grossly intact  CN 7 Face symmetric  CN 8 Hearing grossly intact  CN 11 shrug symmetric  CN 12 Tongue midline  Muscle Strength 5/5 BUE and BLE  Muscle tone WNL  SILT BUE and BLE  No drift  BL hoffman   Gait station not assessed   Biceps reflex 2+  TTP neck pain in paraspinal muscles    Data reviewed  MRI Cervical 12/06/18: Multilevel cervical spondyloarthropathy worst at C5-6  XR C spine Flex/ex 03/21/18: no intersegmental hypermobility    Discussions with other providers:     Assessment:      ICD-10-CM    1. 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 Cervical neck pain with evidence of disc disease M50.90 Referral to Neurosurgery   3. Complex regional pain syndrome type 2 of right upper extremity G56.41 Referral to Neurosurgery   4. 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 female who presents with neck pain and RUE radiculopathy with C5-6 cervical spondylosis and HNP as well as component of neck pain/spasms.    Treatment Plan  -- Start PT (already has script)  -- Robaxin script given  -- Patient also planning to get surgery for R thyroid nodule by Dr. Posey Pronto.   -- RTC in 8 weeks; may consider referral to pain management at that time depending on patient's symptoms vs. ACDF C5-6 vs C5-7 depending on symptoms and patient preferences.     Patient seen and discussed with Dr. Jeral Fruit (Gene) Orie Fisherman, MD  02/02/2019, 13:52  Sharolyn Douglas MD  PGY-6 Neurosurgery       Late entry for 02/02/19. I saw and examined the patient.  I reviewed the resident's note.  I agree with the findings and plan of care as documented in the resident's note.  Any exceptions/additions are edited/noted.    Lindie Spruce, MD

## 2019-02-02 NOTE — Telephone Encounter (Signed)
CM called pharmacy and informed them medication has been approved and they need to call RDT if they are still getting a rejection     Trixie Rude, CASE MANAGER  02/02/2019, 10:56

## 2019-02-02 NOTE — Telephone Encounter (Signed)
-----   Message from [name] sent at 02/02/2019 10:48 AM EDT -----  Minette Brine - Dr Doylene Canning pt     The patient called back stating her fluoxetine has not been approved per her pharmacy.  She has been out of this medication for four days.   Please advise the patient at [phone].   Thank you        Preferred Pharmacy     [id number] - [address], [place] [address]    Phone: [phone] Fax: [phone]    Not a 24 hour pharmacy; exact hours not known.

## 2019-02-02 NOTE — Progress Notes (Addendum)
[name]  Department of Behavioral Medicine and Psychiatry    Outpatient Progress Note      [name]   [id number]  December 10, 1955    DOS: 02/02/2019    Phone   I personally offered the service to the patient, and obtained verbal consent to provide this service.    Jasmine Pang, MD    TELEMEDICINE DOCUMENTATION:    Patient Location:  MyChart video visit from home address: [address]  [place] [address]    Patient/family aware of provider location:  yes  Patient/family consent for telemedicine:  yes  Examination observed and performed by:  Jasmine Pang, MD      Chief complaint:   Chief Complaint   Patient presents with   . Sleep Problem        ID:  [name] is a 63 y.o. female who  has a past medical history of Anxiety, Arthritis, Cancer (CMS [place]), Depression, Dyspnea on exertion, Essential hypertension (02/16/2017), GERD (gastroesophageal reflux disease), Headache, Heart murmur (1979), Hyperlipidemia LDL goal < 100 (08/29/2013), MINOR CAD (coronary artery disease) (11/10/2012), Multinodular goiter, Muscle weakness, Obesity, Peripheral edema, Rash, Rheumatic fever (17), Squamous cell carcinoma (02/06/2015), Vaginal prolapse (2012), and Wears glasses.    Subjective:  Patient presents alone for a follow-up visit for management of  depression. Patient was last seen in clinic on 11/10/2018.    At that time, trazodone was started for sleep problems.  Today, patient reports continued difficulty with sleep.  She believes the trazodone worsens sleep and anxiety.  She does report staying inside and social distancing during [place] pandemic.  She is going for walks out doors and getting exercise.   She does not believe this is related to past trauma. She denied nightmares.  She falls asleep ok but is waking up multiple times in the night.      Reported side effects: no insomnia, no changes in appetite or weight, no galactorrhea or EPS, no tremor, no rash     Collateral  obtained/reviewed:     Medical Hx: no changes since last  visit   Social Hx: no changes since last visit  Previous medication trials:    -     Current Medications:  Current Outpatient Medications   Medication Sig   . acetaminophen (TYLENOL) 325 mg Oral Tablet Take 2 Tabs (650 mg total) by mouth Three times a day (Patient taking differently: Take 650 mg by mouth Every 4 hours as needed )   . aspirin (ECOTRIN) 81 mg Oral Tablet, Delayed Release (E.C.) Take 81 mg by mouth Once a day   . Azelaic Acid 20 % Cream Apply topically Twice daily (Patient taking differently: Apply topically Twice per day as needed )   . cholecalciferol, vitamin D3, 25 mcg (1,000 unit) Oral Tablet Take 1 Tab (1,000 Units total) by mouth Once a day   . Clobetasol (TEMOVATE) 0.05 % Ointment Apply topically Twice daily To rash on hands and feet (Patient taking differently: Apply topically Twice per day as needed To rash on hands and feet)   . clobetasoL (TEMOVATE) 0.05 % Ointment Apply topically   . diclofenac sodium (VOLTAREN) 75 mg Oral Tablet, Delayed Release (E.C.) Take 1 Tab (75 mg total) by mouth Once per day as needed (Patient not taking: Reported on 01/15/2019)   . FLUoxetine (SARAFEM) 20 mg Oral Tablet Take 1 Tab (20 mg total) by mouth Twice daily   . gabapentin (NEURONTIN) 600  mg Oral Tablet Take 1 Tab (600 mg total) by mouth Twice daily   . Halobetasol Propionate (ULTRAVATE) 0.05 % Cream Apply  topically. Three times a week (Patient taking differently: Twice per day as needed Apply  topically. Three times a week)   . Halobetasol Propionate (ULTRAVATE) 0.05 % Cream Apply topically   . hydrOXYzine HCL (ATARAX) 50 mg Oral Tablet Take 1 Tab (50 mg total) by mouth Every night   . methocarbamoL (ROBAXIN) 500 mg Oral Tablet Take 1 Tab (500 mg total) by mouth Four times a day For muscle spasms   . Metronidazole (METROCREAM) 0.75 % Cream Apply topically Twice daily (Patient taking differently: Apply topically Twice per day as needed )   .  Metronidazole (METROCREAM) 0.75 % Cream Apply topically   . mineral oil-hydrophil petrolat (AQUAPHOR) Ointment Apply topically   . nitroGLYCERIN (NITROSTAT) 0.4 mg Sublingual Tablet, Sublingual 1 Tab (0.4 mg total) by Sublingual route Every 5 minutes as needed for Chest pain for up to 3 doses for 3 doses over 15 minutes   . nystatin (NYSTOP) 100,000 unit/gram Powder Apply once or twice daily to PREVENT rash under breasts   . nystatin (NYSTOP) 100,000 unit/gram Powder Apply 1 g topically   . pantoprazole (PROTONIX) 40 mg Oral Tablet, Delayed Release (E.C.) Take 1 Tab (40 mg total) by mouth Every morning before breakfast   . tolterodine (DETROL) 2 mg Oral Tablet Take 2 mg by mouth Once a day   . traZODone (DESYREL) 50 mg Oral Tablet Take 1/2 to full tablet as needed for sleep. (Patient not taking: Reported on 02/02/2019)   . triamcinolone acetonide (ARISTOCORT A) 0.1 % Ointment by Apply Topically route Twice daily (Patient taking differently: by Apply Topically route Twice per day as needed )   . triamcinolone acetonide (ARISTOCORT A) 0.1 % Ointment Apply topically         Review of systems:   Constitutional: no insomnia, no changes in appetite, no weight loss/gain  Cardiac: no chest pain/palpitations  Resp: no shortness of breath  GI: no n/v/d/c  GU: no urinary retention, no incontinence   Msk: no arthralgias/myalgias, no rigidity, no tremulousness, no abnormal movements   Neuro: no headaches, no blurry vision, no balance issues, no falls    Physical Exam:   There were no vitals filed for this visit.      There is no height or weight on file to calculate BMI.    none      Mental Status Exam:  This is an alert  and grossly oriented 63 y.o. female  and she is cooperative and pleasant. Speech is regular rate and volume, clear and articulate. Mood is sad. Thought process is goal-directed with appropriate thought content. Denies SI/HI. No hallucinations, delusions, or paranoia voiced or suspected. Attention is adequate for  the conversation and she displays a fair ability to think abstractly. Judgement and insight is fair.         Assessment:  This is a 63 y.o. female with a history of depression and PTSD presenting for follow-up.  Patient was staying with a sister in [place] but recently was able to move into her own apartment in [place] with HUD assistance.  She receives SSI disability.  She lives by herself.  She takes prozac 40 mg daily for management of depression.  She has had depression since age 82 with multiple episodes of depression.  She has had SI but no attempts.  One hospitalization.  Current stressors include finances and  health problems.  Sleep has worsened with difficult staying asleep and will add trazodone.   Trazodone was not helpful.  Will try vistaril.  She has had a sleep study over 10 years ago but may need another.        Diagnosis:  ENCOUNTER DIAGNOSES     ICD-10-CM   1. 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 PTSD (post-traumatic stress disorder) F43.10   3. Anxiety F41.9   4. 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 Continue fluoxetine 40 mg daily - she will utilize the tablets without blue dye due to an allergy.  1. Taken as 2 20 tabs daily.  2. Stopped trazodone 25-50 mg nightly.  3. Start atarax 50 mg qhs for sleep - keep formulation - can not have blue due due to allergy    Medication Orders   Medications   . hydrOXYzine HCL (ATARAX) 50 mg Oral Tablet     Sig: Take 1 Tab (50 mg total) by mouth Every night     Dispense:  30 Tab     Refill:  1     DAW, please keep this formulation, patient has blue dye allergy.       Laboratory Studies:  None    Therapy[facility]umish [facility]     Safety: Low risk for imminent harm to self.    Follow up: Return to clinic in 3 months . Patient advised to call in the interim with questions/concerns and to go to the ER if safety concerns arise.         Jasmine Pang, MD  [facility]  and Psychiatry  02/05/2019     ATTENDING PHYSICIAN ATTESTATION:  I discussed the patient's care with the resident.  I reviewed the resident's note.  I agree with the findings and plan of care as documented in the resident's note.  Any exceptions/additions are edited/noted.    William Hamburger, MD  02/05/2019, 10:26    William Hamburger, MD  02/05/2019, 15:53

## 2019-02-05 ENCOUNTER — Encounter (HOSPITAL_BASED_OUTPATIENT_CLINIC_OR_DEPARTMENT_OTHER): Payer: Self-pay | Admitting: Family Medicine

## 2019-02-05 ENCOUNTER — Other Ambulatory Visit (HOSPITAL_BASED_OUTPATIENT_CLINIC_OR_DEPARTMENT_OTHER): Payer: Self-pay | Admitting: Family Medicine

## 2019-02-05 DIAGNOSIS — M7671 Peroneal tendinitis, right leg: Secondary | ICD-10-CM

## 2019-02-08 ENCOUNTER — Encounter (INDEPENDENT_AMBULATORY_CARE_PROVIDER_SITE_OTHER): Payer: Self-pay | Admitting: Urology

## 2019-02-12 ENCOUNTER — Encounter (HOSPITAL_BASED_OUTPATIENT_CLINIC_OR_DEPARTMENT_OTHER): Payer: Self-pay | Admitting: Family Medicine

## 2019-02-12 NOTE — Progress Notes (Signed)
FAMILY MEDICINE, [place] [address]  Operated by [facility]  Telephone Visit    Name:  [name] MRN: [id number]   Date:  02/13/2019 Age:   63 y.o.     The patient/family initiated a request for telephone service.  Verbal consent for this service was obtained from the patient/family.    Last office visit in this department: 12/05/2018      Reason for call: Chronic Med Follow Up  Call notes:  63yo F with PMH multinodular goiter, HLD, GERD, GAD, arthritis.    States that her allergic rhinitis is flaring right now. Has tried Zyrtec in the past but has blue dye allergy and did not do well with it. Does well with Flonase but needs new prescription. Has not tried singulair before but is agreeable.     Cervical Spondylosis and Radiculopathy  Complex Regional Pain Syndrome  - follows with neurology, last appt 12/06/18   - continue Gabapentin, increased dose   - NSGY referral   - PT script provided  - follows with neurosurgery, established 02/02/19   - Rx Robaxin 500mg  QID for spasms   - patient to start PT   - f/u in 8wks, consider ACDF vs pain management    Multinodular Goiter  R dominant nodule  - following with ENT, last appt 11/24/18   - plan for hemithyroidectomy, scheduled 01/18/19   - surgery delayed due to pandemic    Mood Disorder  - following with [facility] psychiatry, last appt 02/02/19   - MDD, GAD, PTSD, insomnia   - continue Prozac, stop trazodone, start atarax   - f/u in 3 months      ICD-10-CM    1. Screening for breast cancer Z12.39 MAMMO BILATERAL SCREENING-ADDL VIEWS/BREAST US AS REQ BY RAD   2. Screening for diabetes mellitus Z13.1 GLUCOSE FASTING   3. Hyperlipidemia, unspecified hyperlipidemia type E78.5 AST (SGOT)     ALT (SGPT)     LIPID PANEL   4. Vitamin D deficiency E55.9 VITAMIN D 25, TOTAL   5. Thyroid nodule E04.1 TSH     THYROXINE, FREE (FREE T4)     T3 (TRIIODOTHYRONINE), FREE, SERUM   6. Complex regional pain syndrome I  G90.50    7. Cervical radiculopathy M54.12    8. Mood disorder (CMS HCC) F39    9. Screen for colon cancer Z12.11 ENDOSCOPY REQUEST ([facility] ONLY)   10. Allergic rhinitis J30.9        Complex Regional Pain Syndrome  Cervical Radiculopathy  - continue to follow with neurology and NSGY    Mood Disorder  - continue to follow with [facility] psychiatry    R Thyroid Nodule  - continued to follow with ENT  - surgery will need rescheduled    Multiple Pulmonary Nodules  - 11/06/18 CT Chest wo- multiple b/l pulmonary nodules, increased groundglass nodule in LLL, f/u in 76months  - will need repeat CT Chest wo in January 2021    Preventative Medicine  - ordered lipid panel, fasting glucose, AST/ALT, Vitamin D, TSH, free T4, free T3  - reviewed CBC and non-fasting BMP from 01/05/19  - Mammogram- 02/23/18 negative, f/u in 1 year- ordered  - Cervical Cancer Screening- s/p total vaginal hysterectomy 1979  - Colon Cancer Screening- 01/06/09 colonoscopy with diverticula and recommended repeat in 5 years, DUE NOW, endoscopy referral placed      F/U in 81months for chronic med check.  Total  provider time spent with the patient on the phone: 12 minutes.    Dennison Mascot, MD      I provided Direct Supervision during this virtual medicine encounter by concurrently monitoring patient care through the EHR and appropriate telecommunication technology, reviewing the documentation from the encounter upon its completion, and discussing the details of the encounter with the resident. Please see resident documentation for details.  Mariam Dollar, MD  02/13/2019, 11:35

## 2019-02-13 ENCOUNTER — Encounter (HOSPITAL_BASED_OUTPATIENT_CLINIC_OR_DEPARTMENT_OTHER): Payer: Self-pay | Admitting: Family Medicine

## 2019-02-13 ENCOUNTER — Telehealth: Payer: MEDICAID | Admitting: Family Medicine

## 2019-02-13 DIAGNOSIS — J309 Allergic rhinitis, unspecified: Secondary | ICD-10-CM

## 2019-02-13 DIAGNOSIS — M5412 Radiculopathy, cervical region: Secondary | ICD-10-CM

## 2019-02-13 DIAGNOSIS — E559 Vitamin D deficiency, unspecified: Secondary | ICD-10-CM

## 2019-02-13 DIAGNOSIS — F39 Unspecified mood [affective] disorder: Secondary | ICD-10-CM

## 2019-02-13 DIAGNOSIS — Z1211 Encounter for screening for malignant neoplasm of colon: Secondary | ICD-10-CM

## 2019-02-13 DIAGNOSIS — E041 Nontoxic single thyroid nodule: Secondary | ICD-10-CM

## 2019-02-13 DIAGNOSIS — G9050 Complex regional pain syndrome I, unspecified: Secondary | ICD-10-CM

## 2019-02-13 DIAGNOSIS — Z131 Encounter for screening for diabetes mellitus: Secondary | ICD-10-CM

## 2019-02-13 DIAGNOSIS — E785 Hyperlipidemia, unspecified: Secondary | ICD-10-CM

## 2019-02-13 DIAGNOSIS — Z1239 Encounter for other screening for malignant neoplasm of breast: Secondary | ICD-10-CM

## 2019-02-13 MED ORDER — FLUTICASONE PROPIONATE 50 MCG/ACTUATION NASAL SPRAY,SUSPENSION
1.0000 | Freq: Every day | NASAL | 3 refills | Status: DC
Start: 2019-02-13 — End: 2019-11-05

## 2019-02-13 MED ORDER — MONTELUKAST 10 MG TABLET
10.00 mg | ORAL_TABLET | Freq: Every evening | ORAL | 3 refills | Status: DC
Start: 2019-02-13 — End: 2019-07-31

## 2019-02-25 ENCOUNTER — Other Ambulatory Visit (HOSPITAL_BASED_OUTPATIENT_CLINIC_OR_DEPARTMENT_OTHER): Payer: Self-pay | Admitting: Family Medicine

## 2019-03-02 ENCOUNTER — Other Ambulatory Visit (HOSPITAL_BASED_OUTPATIENT_CLINIC_OR_DEPARTMENT_OTHER): Payer: Self-pay | Admitting: Otolaryngology

## 2019-03-02 DIAGNOSIS — E041 Nontoxic single thyroid nodule: Secondary | ICD-10-CM

## 2019-03-02 DIAGNOSIS — Z01818 Encounter for other preprocedural examination: Secondary | ICD-10-CM

## 2019-03-05 ENCOUNTER — Ambulatory Visit: Payer: MEDICAID | Attending: Family Medicine

## 2019-03-05 ENCOUNTER — Other Ambulatory Visit: Payer: Self-pay

## 2019-03-05 DIAGNOSIS — Z131 Encounter for screening for diabetes mellitus: Secondary | ICD-10-CM

## 2019-03-05 DIAGNOSIS — Z01818 Encounter for other preprocedural examination: Secondary | ICD-10-CM | POA: Insufficient documentation

## 2019-03-05 DIAGNOSIS — E041 Nontoxic single thyroid nodule: Secondary | ICD-10-CM

## 2019-03-05 DIAGNOSIS — E785 Hyperlipidemia, unspecified: Secondary | ICD-10-CM | POA: Insufficient documentation

## 2019-03-05 DIAGNOSIS — E559 Vitamin D deficiency, unspecified: Principal | ICD-10-CM | POA: Insufficient documentation

## 2019-03-05 LAB — BASIC METABOLIC PANEL
ANION GAP: 9 mmol/L (ref 4–13)
BUN/CREA RATIO: 8 (ref 6–22)
BUN: 9 mg/dL (ref 8–25)
CALCIUM: 9.2 mg/dL (ref 8.5–10.2)
CHLORIDE: 108 mmol/L (ref 96–111)
CO2 TOTAL: 23 mmol/L (ref 22–32)
CREATININE: 1.08 mg/dL (ref 0.49–1.10)
ESTIMATED GFR: 56 mL/min/{1.73_m2} — ABNORMAL LOW (ref >60)
GLUCOSE: 101 mg/dL (ref 65–139)
POTASSIUM: 4.2 mmol/L (ref 3.5–5.1)
SODIUM: 140 mmol/L (ref 136–145)

## 2019-03-05 LAB — CBC
HCT: 42.5 % (ref 34.8–46.0)
HGB: 14 g/dL (ref 11.5–16.0)
MCH: 28.9 pg (ref 26.0–32.0)
MCHC: 32.9 g/dL (ref 31.0–35.5)
MCV: 87.6 fL (ref 78.0–100.0)
MPV: 9.2 fL (ref 8.7–12.5)
PLATELETS: 380 10*3/uL (ref 150–400)
RBC: 4.85 10*6/uL (ref 3.85–5.22)
RDW-CV: 14.6 % (ref 11.5–15.5)
WBC: 6 10*3/uL (ref 3.7–11.0)
WBC: 6 x10?3/uL (ref 3.7–11.0)

## 2019-03-05 LAB — LIPID PANEL
CHOL/HDL RATIO: 5.5
CHOLESTEROL: 214 mg/dL — ABNORMAL HIGH (ref <200)
HDL CHOL: 39 mg/dL — ABNORMAL LOW (ref >=49)
LDL CALC: 136 mg/dL — ABNORMAL HIGH (ref <=100)
NON-HDL: 175 mg/dL (ref <=190)
TRIGLYCERIDES: 193 mg/dL — ABNORMAL HIGH (ref <=150)
VLDL CALC: 39 mg/dL — ABNORMAL HIGH (ref <30)

## 2019-03-05 LAB — T3 (TRIIODOTHYRONINE), FREE, SERUM: T3 FREE: 2.9 pg/mL (ref 1.7–3.7)

## 2019-03-05 LAB — THYROID STIMULATING HORMONE (SENSITIVE TSH)
TSH: 1.197 uIU/mL (ref 0.350–5.000)
TSH: 1.197 u[IU]/mL (ref 0.350–5.000)

## 2019-03-05 LAB — THYROXINE, FREE (FREE T4): THYROXINE (T4), FREE: 1.01 ng/dL (ref 0.70–1.25)

## 2019-03-05 LAB — ALT (SGPT): ALT (SGPT): 24 U/L (ref <55)

## 2019-03-05 LAB — AST (SGOT): AST (SGOT): 22 U/L (ref 8–41)

## 2019-03-05 LAB — VITAMIN D 25, TOTAL: VITAMIN D, 25OH: 25 ng/mL — ABNORMAL LOW (ref 30–100)

## 2019-03-20 ENCOUNTER — Encounter (INDEPENDENT_AMBULATORY_CARE_PROVIDER_SITE_OTHER): Payer: Self-pay | Admitting: Neurology

## 2019-03-20 ENCOUNTER — Ambulatory Visit: Payer: MEDICAID | Attending: Neurology | Admitting: Neurology

## 2019-03-20 ENCOUNTER — Other Ambulatory Visit: Payer: Self-pay

## 2019-03-20 VITALS — BP 118/80 | HR 99 | Ht 65.0 in | Wt 216.9 lb

## 2019-03-20 DIAGNOSIS — Z87891 Personal history of nicotine dependence: Secondary | ICD-10-CM | POA: Insufficient documentation

## 2019-03-20 DIAGNOSIS — G5641 Causalgia of right upper limb: Secondary | ICD-10-CM

## 2019-03-20 DIAGNOSIS — F329 Major depressive disorder, single episode, unspecified: Secondary | ICD-10-CM | POA: Insufficient documentation

## 2019-03-20 DIAGNOSIS — Z79899 Other long term (current) drug therapy: Secondary | ICD-10-CM | POA: Insufficient documentation

## 2019-03-20 DIAGNOSIS — Z9889 Other specified postprocedural states: Secondary | ICD-10-CM | POA: Insufficient documentation

## 2019-03-20 DIAGNOSIS — M5090 Cervical disc disorder, unspecified, unspecified cervical region: Secondary | ICD-10-CM

## 2019-03-20 DIAGNOSIS — G90511 Complex regional pain syndrome I of right upper limb: Secondary | ICD-10-CM | POA: Insufficient documentation

## 2019-03-20 MED ORDER — GABAPENTIN 600 MG TABLET
600.0000 mg | ORAL_TABLET | Freq: Two times a day (BID) | ORAL | 4 refills | Status: DC
Start: 2019-03-20 — End: 2020-04-04

## 2019-03-21 NOTE — Progress Notes (Signed)
[facility]  Outpatient History and Physical    Date:  03/20/2019  Name: [name]  Age:  63 y.o.  Referring Physician:   Drema Balzarine, [address]  [place], [place] [address]    History of Present Illness  History obtained from:  patient   PROGRESS NOTE     SUBJECTIVE:  [name] is a 63 year old right-handed Caucasian woman who comes to return visit at the neurology clinic.  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 She also had neurosurgesry evaluation and recommendation was to try conservative therapy for now.      On the last visit, we got an EMG nerve conduction study of her upper extremities. There was no plexopathy, radiculopathy noted on the EMG nerve conduction study from July 2019.  There was no evidence of median neuropathy or at the right wrist or right ulna neuropathy.  We also recommend the patient to get MRI imaging of the cervical spine.  This was done from August 2019, which showed significant disk bulge at C5-C6 and C6-C7, impinging on the thecal sac without significant cord compression.  She continued to have tingling and numbness in the hand, finger and a radicular pain in the right side in the upper extremities.  With gabapentin 600 mg daily, she tolerated the medication well.  She also takes Advil as needed.      On the last visit, we recommended the patient will also benefit from physical therapy for underlying synovitis of the right elbow versus chronic regional pain syndrome.  Her symptoms get worse with activity of the right hand and get better with rest.  She denies any skin color change.  She denies any tingling or numbness in the hand or fingers.  She denies any muscle wasting.  She denies any history of neck trauma.  She described this pain as  a continuous sharp pain around her elbow and occasionally in the right forearm.  She also complains of a burning sensation.       She has been on gabapentin and lidocaine patch as given by her primary care physician.  She denies bowel and bladder incontinence.  She denies any weakness in the lower extremities.  She denies any muscle spasms.  Occasionally, she complains of pain in the right part of the neck.  It occasionally radiates in the right hand.  In the past she was on Cymbalta, but she broke out with blisters she is allergic to this medication.     PAST MEDICAL HISTORY:  Depression.     PAST SURGICAL HISTORY:  Significant for left ulnar nerve repair surgery and surgery done for right parotid gland, hysterectomy, cholecystectomy, appendectomy.     SOCIAL HISTORY:  She is single and has 3 children.  She is a nonsmoker, no IV drugs, no recreational drug use, and nonalcoholic.  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     FAMILY HISTORY:  She denies any family history of CMT.    [name] is a 63 y.o. female who presents with a chief complaint of   Chief Complaint   Patient presents with   . Neurologic Problem        The patient reports: complaint(s) of:  numbness or tingling and pain in neck.     Past Medical History  Current Outpatient Medications   Medication Sig   . acetaminophen (TYLENOL) 325 mg Oral Tablet Take 2 Tabs (650 mg total) by mouth Three times a day (Patient taking differently: Take 650 mg by mouth Every 4 hours as needed )   . aspirin (ECOTRIN) 81 mg Oral Tablet, Delayed Release (E.C.) Take 81 mg by mouth Once a day   . Azelaic Acid 20 % Cream Apply topically Twice daily (Patient taking differently: Apply topically Twice per day as needed )   . cholecalciferol, vitamin D3, 25 mcg (1,000 unit) Oral Tablet Take 1 Tab (1,000 Units total) by mouth Once a day   . Clobetasol (TEMOVATE) 0.05 % Ointment Apply topically Twice daily To rash on hands and feet (Patient taking  differently: Apply topically Twice per day as needed To rash on hands and feet)   . clobetasoL (TEMOVATE) 0.05 % Ointment Apply topically   . diclofenac sodium (VOLTAREN) 75 mg Oral Tablet, Delayed Release (E.C.) Take 1 Tab (75 mg total) by mouth Twice daily   . FLUoxetine (SARAFEM) 20 mg Oral Tablet Take 1 Tab (20 mg total) by mouth Twice daily   . fluticasone propionate (FLONASE) 50 mcg/actuation Nasal Spray, Suspension 1 Spray by Each Nostril route Once a day   . gabapentin (NEURONTIN) 600 mg Oral Tablet Take 1 Tab (600 mg total) by mouth Twice daily   . Halobetasol Propionate (ULTRAVATE) 0.05 % Cream Apply  topically. Three times a week (Patient taking differently: Twice per day as needed Apply  topically. Three times a week)   . Halobetasol Propionate (ULTRAVATE) 0.05 % Cream Apply topically   . hydrOXYzine HCL (ATARAX) 50 mg Oral Tablet Take 1 Tab (50 mg total) by mouth Every night   . methocarbamoL (ROBAXIN) 500 mg Oral Tablet Take 1 Tab (500 mg total) by mouth Four times a day For muscle spasms   . Metronidazole (METROCREAM) 0.75 % Cream Apply topically Twice daily (Patient taking differently: Apply topically Twice per day as needed )   . Metronidazole (METROCREAM) 0.75 % Cream Apply topically   . mineral oil-hydrophil petrolat (AQUAPHOR) Ointment Apply topically   . montelukast (SINGULAIR) 10 mg Oral Tablet Take 1 Tab (10 mg total) by mouth Every evening   . nitroGLYCERIN (NITROSTAT) 0.4 mg Sublingual Tablet, Sublingual 1 Tab (0.4 mg total) by Sublingual route Every 5 minutes as needed for Chest pain for up to 3 doses for 3 doses over 15 minutes   . nystatin (NYSTOP) 100,000 unit/gram Powder Apply once or twice daily to PREVENT rash under breasts   . nystatin (NYSTOP) 100,000 unit/gram Powder Apply 1 g topically   . pantoprazole (PROTONIX) 40 mg Oral Tablet, Delayed Release (E.C.) Take 1 Tab (40 mg total) by mouth Every morning before breakfast   . tolterodine (DETROL) 2 mg Oral Tablet Take 2 mg by mouth  Once a day   . traZODone (DESYREL) 50 mg Oral Tablet Take 1/2 to full tablet as needed for sleep. (Patient not taking: Reported on 02/02/2019)   . triamcinolone acetonide (ARISTOCORT A) 0.1 % Ointment by Apply Topically route Twice daily (Patient taking differently: by Apply Topically route Twice per day as needed )   . triamcinolone acetonide (ARISTOCORT A) 0.1 % Ointment Apply topically     Allergies   Allergen Reactions   . Cymbalta [Duloxetine]      Rash on feet and hands   . Blue Dye  Told to avoid due to patch test result by Simone Curia   . Flavoring Agent    . Lipitor [Atorvastatin] Myalgia     Muscle pain   . Nickel      Blisters     . Codeine Itching     RASH   . Mobic [Meloxicam] Nausea/ Vomiting     Nervous and shakes    . Naprosyn [Naproxen] Nausea/ Vomiting   . Nsaids (Non-Steroidal Anti-Inflammatory Drug) Nausea/ Vomiting     Past Medical History:   Diagnosis Date   . Anxiety    . Arthritis    . Cancer (CMS HCC)     internal skin cancer   . Depression    . Dyspnea on exertion    . Essential hypertension 02/16/2017    patient denies   . GERD (gastroesophageal reflux disease)     controlled w/med   . Headache    . Heart murmur 1979    benign   . Hyperlipidemia LDL goal < 100 08/29/2013   . MINOR CAD (coronary artery disease) 11/10/2012    no treatment other than cholesterol medications; follows regularly with Dr. Rodney Booze   . Multinodular goiter    . Muscle weakness     neck    . Obesity    . Peripheral edema    . Rash     face only eczema   . Rheumatic fever 16    no complications   . Squamous cell carcinoma 02/06/2015    right side of neck   . Vaginal prolapse 2012    surgery improved significantly   . Wears glasses          Past Surgical History:   Procedure Laterality Date   . COLONOSCOPY  01/06/09    COLONOSCOPY performed by Loletta Parish F at [facility]   . GASTROSCOPY  03/05/2010    GASTROSCOPY performed by Jocelyn Lamer at [facility]   . GASTROSCOPY WITH BIOPSY  03/05/2010    GASTROSCOPY WITH BIOPSY  performed by Jocelyn Lamer at [facility]   . HX ADENOIDECTOMY     . HX ANKLE FRACTURE [place]  2007    left distal fibula, casted   . HX APPENDECTOMY     . HX CHOLECYSTECTOMY     . HX COLONOSCOPY     . HX CYSTOCELE REPAIR  09/24/09   . HX HAND SURGERY  2012    for Carpal tunnel    . HX OOPHORECTOMY      left ovary removed   . HX TONSILLECTOMY     . HX TOTAL VAGINAL HYSTERECTOMY  1979   . HX WRIST FRACTURE [place]  2007    [place] injury   . PB REVISE ULNAR NERVE AT ELBOW Left 1979   . PB UPPER GI ENDOSCOPY,BIOPSY  12/19/07    patulous GE junction zone, erythema, nonerosive GERD         Family Medical History:     Problem Relation (Age of Onset)    Bipolar Disorder Daughter    Breast Cancer Paternal Aunt, Paternal Aunt, Paternal Aunt, Paternal Aunt, Paternal Aunt, Other    Cancer Paternal Aunt, Paternal 41, Paternal 17, Paternal 61, Paternal 31, Other (55)    Congestive Heart Failure Father (84)    Coronary Artery Disease Father, Mother (70)    Diabetes Mother    Heart Attack Father, Sister    High Cholesterol Mother    Hypertension (High Blood Pressure) Mother, Brother, Sister,  Sister    Kidney Disease Sister    Leukemia Paternal Uncle, Paternal Uncle    Stroke Paternal Grandmother    Thyroid Disease Sister            Social History     Socioeconomic History   . Marital status: Divorced     Spouse name: Not on file   . Number of children: Not on file   . Years of education: Not on file   . Highest education level: Not on file   Occupational History   . 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 Smoking status: Former Smoker     Packs/day: 2.00     Years: 40.00     Pack years: 80.00     Types: Cigarettes     Last attempt to quit: 01/22/2005     Years since quitting: 14.1   . Smokeless tobacco: Never Used   Substance and Sexual Activity   . Alcohol use: No     Alcohol/week: 0.0 standard drinks   . Drug use: No   . Sexual activity: Yes     Partners: Male   Other  Topics Concern   . Uses Cane No   . Uses walker No   . Uses wheelchair No   . Right hand dominant Yes   . Left hand dominant No   . Ambidextrous No   . Ability to Walk 1 Flight of Steps without SOB/CP Yes   . Routine Exercise Yes     Comment: walk   . Ability to Walk 2 Flight of Steps without SOB/CP No     Comment: shortness of breath, no cp    . Ability To Do Own ADL's Yes   . Shift Work No   . Unusual Sleep-Wake Schedule No       Review of Systems  Other than ROS in the HPI, all other systems were negative.    Examination:    Vitals: BP 118/80   Pulse 99   Ht 1.651 m (5\' 5" )   Wt 98.4 kg (216 lb 14.9 oz)   SpO2 98%   BMI 36.10 kg/m       General: appears in good health  HEENT:  Atraumatic, no pallor, no icterus.  NECK:  No nuchal rigidity.  MUSCULOSKELETAL:  Normal.  SKIN:  No rashes.  CARDIOVASCULAR:  S1 and S2 heard.  No murmurs noted.  RESPIRATORY:  Bilateral bronchovascular breath sounds appreciated.  No crackles or wheezes.  NEUROLOGIC:  Comprehensive neurologic exam was performed.  She is alert and oriented to time, place, and person.  Good recall.  Good registration.  Normal episodic, semantic, and procedural memory.  No hallucinations or delusions.  Mood was congruent.Sppech is fluent and comprehensive.Fund of knowledge intact.  CRANIAL NERVES Ophthalmoscopic: fundus exam showed no disc pallor . Visual acuity 20/30 without correction.  Visual fields were normal. Pupils are reactive to light. There is no INO no APD.Extraocular movements, normal full versions and ductions with normal pursuits and saccades. Normal facial sensation and strength in V1, V2, and V3 area.  No dysarthria.  Muscles of mastication normal.  Tongue movements were normal.  Normal hearing to finger rub. Cranial nerve 9 and 10 is intact gag reflex intact.Palate elevates in midline. Uvula in center.   MOTOR:  Gait was normal with normal arm swing and stride. Rapid alternating movement was intact. Strength 5/5 in both  upper and lower  extremity. Wrist extension on the right, she has a component of give-away weakness due to pain.  Again, on right shoulder abduction, she complained of give-away weakness due to pain, otherwise motor and sensory 5/5 throughout.  There is no muscle wasting noted.  Tone and bulk appear normal.  Abductor pollicis brevis muscle strength is 4/5 on the right side as compared to the left.  Palmar and dorsal interossei muscle strength is 5/5 bilaterally.    Tone and bulk appear normal. Muscle Tone: Normal  COORDINATION:intact for finger to nose test, heel to shin test, tandem walk intact.  Muscle exam  Arm Right Left Leg Right Left   Deltoid 5/5 5/5 Iliopsoas 5/5 5/5   Biceps 5/5 5/5 Quads 5/5 5/5   Triceps 5/5 5/5 Hamstrings 5/5 5/5   Wrist Extension 5/5 5/5 Ankle Dorsi Flexion 5/5 5/5   Wrist Flexion 5/5 5/5 Ankle Plantar Flexion 5/5 5/5   Interossei 4/5 5/5 Ankle Eversion 5/5 5/5   APB 4/5 5/5 Ankle Inversion 5/5 5/5         Reflexes    RJ BJ TJ KJ AJ Plantars Hoffman's   Right 2+ 2+ 2+ 3+ 2+ downgoing Not present   Left 2+ 2+ 2+ 3+ 2+ downgoing Not present    Sensory examination was intact for light touch, pinprick. vibration sensation and position sense  was intact.  I have reviewed the following: images    Assessment and Plan    1. Cervical neck pain with evidence of disc disease    2. Complex regional pain syndrome type 2 of right upper extremity      Orders Placed This Encounter   . gabapentin (NEURONTIN) 600 mg Oral Tablet     ASSESSMENT AND PLAN:  Curlie Sittner is a 63 year old right-handed Caucasian woman with a significant history of parotid gland tumor s/p surgery done and history of depression.  She comes for follow up visit the complaint of pain in the right forearm pain associated with swelling.  These symptoms have been ongoing for over a year.       On the last visit, we recommend that the patient get an MRI imaging of the cervical spine, which was done from August 2019, which showed disk bulge at  C5-C6-C6-C7 indenting the thecal sac without significant cord impingement.As per neurosurgery recommendation to see if physical therapy improves her symptoms if not then she willl get ACDF cervical spine fusion surgery.She is also schedule for thyroid surgery for enlarged thyroid in June 2020.  Encouraged physical therapy and Neurontin 600mg  bid.     We are going to increase the dose of gabapentin at mg 600mg  two times a day .Side effects of medication including weight gain ,sedation was discussed.   We will see the patient in 3-4 months or as needed.  The patient agrees with the plan.  Total face-to-face time by staff:70minutes. Greater than 50% of that time (25 minutes)was spent on counseling/coordination of care regarding:ceevical radiculopathy,periepheral neuropathy,compelx regional pain syndrome,tenosynovisits, treatment ,counselling,mri imaging study    Catalina Gravel, MD 03/20/2019, [phone]

## 2019-03-24 ENCOUNTER — Other Ambulatory Visit (HOSPITAL_COMMUNITY): Payer: Self-pay | Admitting: Psychiatry

## 2019-03-27 ENCOUNTER — Encounter (HOSPITAL_BASED_OUTPATIENT_CLINIC_OR_DEPARTMENT_OTHER): Payer: Self-pay | Admitting: Otolaryngology

## 2019-03-27 NOTE — Progress Notes (Signed)
03/27/2019    Date of Surgery: 04/05/19  COVID ASYMPTOMIC SURGERY REQUIREMENTS & INSTRUCTIONS  High Risk Environments & list of Surgical/Procedures below will require  a 'COVID-19 Screening - PREOP' order to be placed & testing to be done 48-72 HOURS PRIOR to surgery at a [facility] testing tent or other facility.  If an outside facility is used the results MUST BE brought the day of surgery.      Home Environment:    Nursing Home with [facility]   Patients who have travelled out of state or community with high prevalence rate within previous 14 days   Patient with household contact to a COVID-positive person    Surgical/Procedural Factors: Procedures involving the following procedural anatomic sites:   Intranasal/paranasal: including sinuses, septum, turbinates   Mastoid surgery which involves drilling   Oral Cavity: including dental, oral surgery   Oropharynx: including tonsils, EGD, TEE (done as intra-op or stand alone procedure)   Hypopharynx   Larynx and trachea: including bronchoscopy   Lung resections   Esophageal resections   Transplant surgeries: solid organ and BMT   CPAP titration in sleep lab studies   Thoracic Cryotherapy    If at all possible self-quarantine until surgery/procedure which includes:  no visitors, frequent hand washing, & social distance, if possible, from other household members.  WEAR MASK while in public/around others.    Bud Face, RN      Patient instructed on self quarantine before surgery.  Advised she will not need Covid 19 testing prior to surgery.  She states she is not taking aspirin at this time, and will restart her aspirin after surgery since she takes it for good heart health, not prescribed by her doctor.    Bud Face, RN

## 2019-03-29 ENCOUNTER — Ambulatory Visit (INDEPENDENT_AMBULATORY_CARE_PROVIDER_SITE_OTHER): Payer: Self-pay | Admitting: Neurological Surgery

## 2019-03-29 NOTE — Telephone Encounter (Signed)
I called and spoke to [name] in regards to her coming in to see [name]. [name] has been unable to do PT due to COVID-19. [name] will call us once that gets done. [email or web address] 03-29-19

## 2019-03-29 NOTE — Telephone Encounter (Signed)
Regarding: [name]  ----- Message from Azucena Kuba sent at 03/29/2019  9:37 AM EDT -----  -needs to r/s with [name], no PT yet due to COVID. No schedule for provider.

## 2019-04-02 ENCOUNTER — Other Ambulatory Visit: Payer: Self-pay

## 2019-04-02 ENCOUNTER — Inpatient Hospital Stay (HOSPITAL_COMMUNITY)
Admission: RE | Admit: 2019-04-02 | Discharge: 2019-04-02 | Disposition: A | Discharge status: Home / Self Care | Payer: MEDICAID | Source: Ambulatory Visit

## 2019-04-02 ENCOUNTER — Encounter (HOSPITAL_COMMUNITY): Payer: Self-pay

## 2019-04-04 ENCOUNTER — Encounter (HOSPITAL_BASED_OUTPATIENT_CLINIC_OR_DEPARTMENT_OTHER): Payer: Self-pay | Admitting: Physician Assistant

## 2019-04-04 ENCOUNTER — Ambulatory Visit: Payer: MEDICAID | Attending: Physician Assistant | Admitting: Physician Assistant

## 2019-04-04 ENCOUNTER — Other Ambulatory Visit: Payer: Self-pay

## 2019-04-04 VITALS — BP 126/74 | HR 72 | Temp 97.7°F | Ht 65.39 in | Wt 218.7 lb

## 2019-04-04 DIAGNOSIS — Z87891 Personal history of nicotine dependence: Secondary | ICD-10-CM | POA: Insufficient documentation

## 2019-04-04 DIAGNOSIS — Z79899 Other long term (current) drug therapy: Secondary | ICD-10-CM | POA: Insufficient documentation

## 2019-04-04 DIAGNOSIS — Z9189 Other specified personal risk factors, not elsewhere classified: Secondary | ICD-10-CM | POA: Insufficient documentation

## 2019-04-04 DIAGNOSIS — H00011 Hordeolum externum right upper eyelid: Secondary | ICD-10-CM | POA: Insufficient documentation

## 2019-04-04 DIAGNOSIS — Z23 Encounter for immunization: Secondary | ICD-10-CM | POA: Insufficient documentation

## 2019-04-04 DIAGNOSIS — Z1239 Encounter for other screening for malignant neoplasm of breast: Secondary | ICD-10-CM | POA: Insufficient documentation

## 2019-04-04 DIAGNOSIS — Z1159 Encounter for screening for other viral diseases: Secondary | ICD-10-CM | POA: Insufficient documentation

## 2019-04-04 MED ORDER — ERYTHROMYCIN 5 MG/GRAM (0.5 %) EYE OINTMENT
TOPICAL_OINTMENT | Freq: Two times a day (BID) | OPHTHALMIC | 0 refills | Status: DC
Start: 2019-04-04 — End: 2021-05-18

## 2019-04-04 NOTE — Progress Notes (Signed)
Subjective:     Patient ID:  [name] is an 63 y.o. female   Chief Complaint:    Chief Complaint   Patient presents with   . Eye Swelling     right       HPI     The patient is complaining of a painful swollen area on her upper right eyelid. She does feel it is making her right eye sore and a little blurry. She denies vision changes, drainage from the eye, or itching. It is more painful when she closes her eyes. Her symptoms began last night. She has tried hot compresses this morning without any relief. She does not wear contacts. She does have environmental allergies and uses Flonase daily and takes Atarax nightly.     Current Outpatient Medications   Medication Sig   . acetaminophen (TYLENOL) 325 mg Oral Tablet Take 2 Tabs (650 mg total) by mouth Three times a day (Patient taking differently: Take 650 mg by mouth Every 4 hours as needed )   . aspirin (ECOTRIN) 81 mg Oral Tablet, Delayed Release (E.C.) Take 81 mg by mouth Once a day   . Azelaic Acid 20 % Cream Apply topically Twice daily (Patient taking differently: Apply topically Twice per day as needed )   . cholecalciferol, vitamin D3, 25 mcg (1,000 unit) Oral Tablet Take 1 Tab (1,000 Units total) by mouth Once a day (Patient not taking: Reported on 04/02/2019)   . Clobetasol (TEMOVATE) 0.05 % Ointment Apply topically Twice daily To rash on hands and feet (Patient taking differently: Apply topically Twice per day as needed To rash on hands and feet)   . clobetasoL (TEMOVATE) 0.05 % Ointment Apply topically   . diclofenac sodium (VOLTAREN) 75 mg Oral Tablet, Delayed Release (E.C.) Take 1 Tab (75 mg total) by mouth Twice daily (Patient not taking: Reported on 04/02/2019)   . FLUoxetine (SARAFEM) 20 mg Oral Tablet Take 1 Tab (20 mg total) by mouth Twice daily   . fluticasone propionate (FLONASE) 50 mcg/actuation Nasal Spray, Suspension 1 Spray by Each Nostril route Once a day   . gabapentin (NEURONTIN) 600 mg Oral Tablet Take 1 Tab (600 mg total) by mouth  Twice daily   . Halobetasol Propionate (ULTRAVATE) 0.05 % Cream Apply  topically. Three times a week (Patient taking differently: Twice per day as needed Apply  topically. Three times a week)   . Halobetasol Propionate (ULTRAVATE) 0.05 % Cream Apply topically   . hydrOXYzine HCL (ATARAX) 50 mg Oral Tablet TAKE 1 TABLET BY MOUTH EVERY NIGHT   . methocarbamoL (ROBAXIN) 500 mg Oral Tablet Take 1 Tab (500 mg total) by mouth Four times a day For muscle spasms   . Metronidazole (METROCREAM) 0.75 % Cream Apply topically Twice daily (Patient taking differently: Apply topically Twice per day as needed )   . Metronidazole (METROCREAM) 0.75 % Cream Apply topically   . mineral oil-hydrophil petrolat (AQUAPHOR) Ointment Apply topically   . montelukast (SINGULAIR) 10 mg Oral Tablet Take 1 Tab (10 mg total) by mouth Every evening (Patient not taking: Reported on 04/02/2019)   . nitroGLYCERIN (NITROSTAT) 0.4 mg Sublingual Tablet, Sublingual 1 Tab (0.4 mg total) by Sublingual route Every 5 minutes as needed for Chest pain for up to 3 doses for 3 doses over 15 minutes   . nystatin (NYSTOP) 100,000 unit/gram Powder Apply once or twice daily to PREVENT rash under breasts   . nystatin (NYSTOP) 100,000 unit/gram Powder Apply 1 g topically   .  pantoprazole (PROTONIX) 40 mg Oral Tablet, Delayed Release (E.C.) Take 1 Tab (40 mg total) by mouth Every morning before breakfast   . tolterodine (DETROL) 2 mg Oral Tablet Take 2 mg by mouth Once a day   . traZODone (DESYREL) 50 mg Oral Tablet Take 1/2 to full tablet as needed for sleep. (Patient not taking: Reported on 02/02/2019)   . triamcinolone acetonide (ARISTOCORT A) 0.1 % Ointment by Apply Topically route Twice daily (Patient taking differently: by Apply Topically route Twice per day as needed )   . triamcinolone acetonide (ARISTOCORT A) 0.1 % Ointment Apply topically     Allergies   Allergen Reactions   . Cymbalta [Duloxetine]      Rash on feet and hands   . Blue Dye      Told to avoid due to  patch test result by Simone Curia   . Flavoring Agent    . Lipitor [Atorvastatin] Myalgia     Muscle pain   . Nickel      Blisters     . Codeine Itching     RASH   . Mobic [Meloxicam] Nausea/ Vomiting     Nervous and shakes    . Naprosyn [Naproxen] Nausea/ Vomiting   . Nsaids (Non-Steroidal Anti-Inflammatory Drug) Nausea/ Vomiting     Past Medical History:   Diagnosis Date   . Anxiety    . Arthritis    . Cancer (CMS HCC)     internal skin cancer   . Depression    . Dyspnea on exertion    . Essential hypertension 02/16/2017    patient denies   . GERD (gastroesophageal reflux disease)     controlled w/med   . Headache    . Heart murmur 1979    benign   . Hyperlipidemia LDL goal < 100 08/29/2013   . MINOR CAD (coronary artery disease) 11/10/2012    no treatment other than cholesterol medications; follows regularly with Dr. Rodney Booze   . Multinodular goiter    . Muscle weakness     neck    . Obesity    . Peripheral edema    . Rash     face only eczema   . Rheumatic fever 16    no complications   . Squamous cell carcinoma 02/06/2015    right side of neck   . Vaginal prolapse 2012    surgery improved significantly   . Wears glasses      Social History     Tobacco Use   . Smoking status: Former Smoker     Packs/day: 2.00     Years: 40.00     Pack years: 80.00     Types: Cigarettes     Last attempt to quit: 01/22/2005     Years since quitting: 14.2   . Smokeless tobacco: Never Used   Substance Use Topics   . Alcohol use: No     Alcohol/week: 0.0 standard drinks   . Drug use: No     Family Medical History:     Problem Relation (Age of Onset)    Bipolar Disorder Daughter    Breast Cancer Paternal Aunt, Paternal Aunt, Paternal Aunt, Paternal Aunt, Paternal 52, Other    Cancer Paternal Aunt, Paternal 54, Paternal 21, Paternal 39, Paternal Aunt, Other (41)    Congestive Heart Failure Father (32)    Coronary Artery Disease Father, Mother (82)    Diabetes Mother    Heart Attack Father, Sister    High Cholesterol  Mother     Hypertension (High Blood Pressure) Mother, Brother, Sister, Sister    Kidney Disease Sister    Leukemia Paternal Uncle, Paternal Uncle    Stroke Paternal Grandmother    Thyroid Disease Sister          Review of Systems   Constitutional: Negative.    HENT: Negative.    Eyes: Positive for pain and redness.        Right upper eyelid swelling.    Respiratory: Negative.    Cardiovascular: Negative.    Gastrointestinal: Negative.    Endocrine: Negative.    Genitourinary: Negative.    Musculoskeletal: Negative.    Skin: Negative.    Neurological: Negative.    Psychiatric/Behavioral: Negative.      Objective:   Physical Exam  Vitals signs reviewed.   Constitutional:       Appearance: Normal appearance.   HENT:      Head: Normocephalic and atraumatic.      Nose: Nose normal.   Eyes:      General: Gaze aligned appropriately.         Right eye: Hordeolum present. No discharge.         Left eye: No discharge.      Extraocular Movements: Extraocular movements intact.      Conjunctiva/sclera: Conjunctivae normal.      Right eye: Right conjunctiva is not injected.      Left eye: Left conjunctiva is not injected.      Pupils: Pupils are equal, round, and reactive to light.   Neck:      Musculoskeletal: Normal range of motion.   Cardiovascular:      Rate and Rhythm: Normal rate and regular rhythm.      Heart sounds: Normal heart sounds.   Pulmonary:      Effort: Pulmonary effort is normal.      Breath sounds: Normal breath sounds.   Skin:     General: Skin is warm and dry.   Neurological:      General: No focal deficit present.      Mental Status: She is alert and oriented to person, place, and time.   Psychiatric:         Mood and Affect: Mood normal.         Behavior: Behavior normal.       Ortho Exam     Vitals:    04/04/19 1111   BP: 126/74   Pulse: 72   Temp: 36.5 C (97.7 F)   TempSrc: Thermal Scan   SpO2: 97%   Weight: 99.2 kg (218 lb 11.1 oz)   Height: 1.661 m (5' 5.39")   BMI: 36.03       Assessment & Plan:     1.  (H00.011) Hordeolum externum of right upper eyelid  (primary encounter diagnosis)  Plan: May apply Erythromycin 0.5% ophthalmic ointment to right upper eyelid BID for 5 to 7 days.  To apply warm moist compresses to affected eye several times a day.  To call office if symptoms persist or worsen.         2. (Z12.39) Breast cancer screening  Plan: Placed order for routine mammogram.  Will notify of results.  To call office if questions or concerns arise.     3. (Z23) Need for shingles vaccine  Plan: Patient received first Shingrix vaccination today in clinic and tolerated well.  Placed future order for her second Shingrix in 2 months.  To call office if questions or  concerns arise.      Patient seen independently with cosigning physician present in clinic.    Lorn Junes, PA-C    [place], [place]  04/04/2019, 11:51

## 2019-04-05 ENCOUNTER — Observation Stay
Admission: RE | Admit: 2019-04-05 | Discharge: 2019-04-06 | Disposition: A | Discharge status: Home / Self Care | Payer: MEDICAID | Source: Ambulatory Visit | Attending: Otolaryngology | Admitting: Otolaryngology

## 2019-04-05 ENCOUNTER — Other Ambulatory Visit (HOSPITAL_COMMUNITY): Payer: Self-pay | Admitting: Otolaryngology

## 2019-04-05 ENCOUNTER — Ambulatory Visit (HOSPITAL_BASED_OUTPATIENT_CLINIC_OR_DEPARTMENT_OTHER): Payer: MEDICAID | Admitting: Certified Registered"

## 2019-04-05 ENCOUNTER — Other Ambulatory Visit: Payer: Self-pay

## 2019-04-05 ENCOUNTER — Encounter (HOSPITAL_COMMUNITY)
Admission: RE | Disposition: A | Discharge status: Home / Self Care | Payer: Self-pay | Source: Ambulatory Visit | Attending: Otolaryngology

## 2019-04-05 ENCOUNTER — Ambulatory Visit (HOSPITAL_COMMUNITY): Payer: MEDICAID

## 2019-04-05 ENCOUNTER — Encounter (HOSPITAL_COMMUNITY): Payer: Self-pay

## 2019-04-05 ENCOUNTER — Observation Stay (HOSPITAL_COMMUNITY): Payer: MEDICAID | Admitting: Otolaryngology

## 2019-04-05 DIAGNOSIS — Z8349 Family history of other endocrine, nutritional and metabolic diseases: Secondary | ICD-10-CM | POA: Insufficient documentation

## 2019-04-05 DIAGNOSIS — E890 Postprocedural hypothyroidism: Secondary | ICD-10-CM | POA: Diagnosis present

## 2019-04-05 DIAGNOSIS — Z808 Family history of malignant neoplasm of other organs or systems: Secondary | ICD-10-CM

## 2019-04-05 DIAGNOSIS — Z806 Family history of leukemia: Secondary | ICD-10-CM | POA: Insufficient documentation

## 2019-04-05 DIAGNOSIS — F4310 Post-traumatic stress disorder, unspecified: Secondary | ICD-10-CM | POA: Insufficient documentation

## 2019-04-05 DIAGNOSIS — G519 Disorder of facial nerve, unspecified: Secondary | ICD-10-CM | POA: Insufficient documentation

## 2019-04-05 DIAGNOSIS — Z7982 Long term (current) use of aspirin: Secondary | ICD-10-CM | POA: Insufficient documentation

## 2019-04-05 DIAGNOSIS — F3341 Major depressive disorder, recurrent, in partial remission: Secondary | ICD-10-CM | POA: Insufficient documentation

## 2019-04-05 DIAGNOSIS — Z85828 Personal history of other malignant neoplasm of skin: Secondary | ICD-10-CM | POA: Insufficient documentation

## 2019-04-05 DIAGNOSIS — Z841 Family history of disorders of kidney and ureter: Secondary | ICD-10-CM | POA: Insufficient documentation

## 2019-04-05 DIAGNOSIS — Z9889 Other specified postprocedural states: Secondary | ICD-10-CM | POA: Diagnosis present

## 2019-04-05 DIAGNOSIS — I10 Essential (primary) hypertension: Secondary | ICD-10-CM | POA: Insufficient documentation

## 2019-04-05 DIAGNOSIS — Z823 Family history of stroke: Secondary | ICD-10-CM | POA: Insufficient documentation

## 2019-04-05 DIAGNOSIS — I2510 Atherosclerotic heart disease of native coronary artery without angina pectoris: Secondary | ICD-10-CM | POA: Insufficient documentation

## 2019-04-05 DIAGNOSIS — Z90710 Acquired absence of both cervix and uterus: Secondary | ICD-10-CM | POA: Insufficient documentation

## 2019-04-05 DIAGNOSIS — Z833 Family history of diabetes mellitus: Secondary | ICD-10-CM | POA: Insufficient documentation

## 2019-04-05 DIAGNOSIS — E042 Nontoxic multinodular goiter: Secondary | ICD-10-CM

## 2019-04-05 DIAGNOSIS — G5601 Carpal tunnel syndrome, right upper limb: Secondary | ICD-10-CM | POA: Insufficient documentation

## 2019-04-05 DIAGNOSIS — M1710 Unilateral primary osteoarthritis, unspecified knee: Secondary | ICD-10-CM | POA: Insufficient documentation

## 2019-04-05 DIAGNOSIS — K219 Gastro-esophageal reflux disease without esophagitis: Secondary | ICD-10-CM | POA: Insufficient documentation

## 2019-04-05 DIAGNOSIS — E669 Obesity, unspecified: Secondary | ICD-10-CM | POA: Insufficient documentation

## 2019-04-05 DIAGNOSIS — Z8249 Family history of ischemic heart disease and other diseases of the circulatory system: Secondary | ICD-10-CM | POA: Insufficient documentation

## 2019-04-05 DIAGNOSIS — M542 Cervicalgia: Secondary | ICD-10-CM | POA: Insufficient documentation

## 2019-04-05 DIAGNOSIS — Z803 Family history of malignant neoplasm of breast: Secondary | ICD-10-CM | POA: Insufficient documentation

## 2019-04-05 DIAGNOSIS — Z87891 Personal history of nicotine dependence: Secondary | ICD-10-CM | POA: Insufficient documentation

## 2019-04-05 DIAGNOSIS — Z79899 Other long term (current) drug therapy: Secondary | ICD-10-CM | POA: Insufficient documentation

## 2019-04-05 DIAGNOSIS — Z9089 Acquired absence of other organs: Secondary | ICD-10-CM | POA: Insufficient documentation

## 2019-04-05 DIAGNOSIS — D34 Benign neoplasm of thyroid gland: Secondary | ICD-10-CM

## 2019-04-05 DIAGNOSIS — Z9049 Acquired absence of other specified parts of digestive tract: Secondary | ICD-10-CM | POA: Insufficient documentation

## 2019-04-05 DIAGNOSIS — Z791 Long term (current) use of non-steroidal anti-inflammatories (NSAID): Secondary | ICD-10-CM | POA: Insufficient documentation

## 2019-04-05 DIAGNOSIS — Z6836 Body mass index (BMI) 36.0-36.9, adult: Secondary | ICD-10-CM | POA: Insufficient documentation

## 2019-04-05 HISTORY — PX: HX PARTIAL THYROIDECTOMY: SHX18

## 2019-04-05 LAB — TYPE AND SCREEN
ABO/RH(D): B POS
ANTIBODY SCREEN: NEGATIVE

## 2019-04-05 SURGERY — HEMITHYROIDECTOMY
Anesthesia: General | Site: Neck | Laterality: Right | Wound class: Clean Wound: Uninfected operative wounds in which no inflammation occurred

## 2019-04-05 MED ORDER — FENTANYL (PF) 50 MCG/ML INJECTION SOLUTION
25.0000 ug | INTRAMUSCULAR | Status: AC | PRN
Start: 2019-04-05 — End: 2019-04-05
  Administered 2019-04-05 (×4): 25 ug via INTRAVENOUS
  Filled 2019-04-05: qty 2

## 2019-04-05 MED ORDER — SUCCINYLCHOLINE(PF)200 MG/10 ML(20 MG/ML)-NACL,ISO INTRAVENOUS SYRINGE
INJECTION | Freq: Once | INTRAVENOUS | Status: DC | PRN
Start: 2019-04-05 — End: 2019-04-05
  Administered 2019-04-05: 160 mg via INTRAVENOUS

## 2019-04-05 MED ORDER — SODIUM CHLORIDE 0.9 % (FLUSH) INJECTION SYRINGE
2.0000 mL | INJECTION | INTRAMUSCULAR | Status: DC | PRN
Start: 2019-04-05 — End: 2019-04-06

## 2019-04-05 MED ORDER — CHOLECALCIFEROL (VITAMIN D3) 25 MCG (1,000 UNIT) TABLET
1000.0000 [IU] | ORAL_TABLET | Freq: Every day | ORAL | Status: DC
Start: 2019-04-06 — End: 2019-04-06
  Administered 2019-04-06: 1000 [IU] via ORAL
  Filled 2019-04-05: qty 1

## 2019-04-05 MED ORDER — BACITRACIN 500 UNIT/G OINTMENT TUBE
TOPICAL_OINTMENT | Freq: Once | CUTANEOUS | Status: DC | PRN
Start: 2019-04-05 — End: 2019-04-05

## 2019-04-05 MED ORDER — LACTATED RINGERS INTRAVENOUS SOLUTION
INTRAVENOUS | Status: DC
Start: 2019-04-05 — End: 2019-04-05
  Administered 2019-04-05: 15:00:00 via INTRAVENOUS

## 2019-04-05 MED ORDER — REMIFENTANIL 50 MCG/ML INFUSION - FOR ANES
INTRAVENOUS | Status: DC | PRN
Start: 2019-04-05 — End: 2019-04-05
  Administered 2019-04-05: 0 ug/kg/min via INTRAVENOUS
  Administered 2019-04-05: 0.1 ug/kg/min via INTRAVENOUS

## 2019-04-05 MED ORDER — SODIUM CHLORIDE 0.9 % INTRAVENOUS SOLUTION
INTRAVENOUS | Status: DC | PRN
Start: 2019-04-05 — End: 2019-04-05
  Administered 2019-04-05: 13:00:00 via INTRAVENOUS

## 2019-04-05 MED ORDER — PROPOFOL 10 MG/ML IV BOLUS
INJECTION | Freq: Once | INTRAVENOUS | Status: DC | PRN
Start: 2019-04-05 — End: 2019-04-05
  Administered 2019-04-05: 180 mg via INTRAVENOUS
  Administered 2019-04-05: 20 mg via INTRAVENOUS

## 2019-04-05 MED ORDER — EPHEDRINE SULFATE 50 MG/ML INJECTION SOLUTION
Freq: Once | INTRAMUSCULAR | Status: DC | PRN
Start: 2019-04-05 — End: 2019-04-05
  Administered 2019-04-05 (×2): 10 mg via INTRAVENOUS

## 2019-04-05 MED ORDER — SODIUM CHLORIDE 0.9 % (FLUSH) INJECTION SYRINGE
2.0000 mL | INJECTION | Freq: Three times a day (TID) | INTRAMUSCULAR | Status: DC
Start: 2019-04-05 — End: 2019-04-06
  Administered 2019-04-05: 21:00:00 2 mL
  Administered 2019-04-05: 09:00:00
  Administered 2019-04-05 – 2019-04-06 (×2): 0 mL

## 2019-04-05 MED ORDER — HYDROCODONE 5 MG-ACETAMINOPHEN 325 MG TABLET
1.0000 | ORAL_TABLET | ORAL | 0 refills | Status: DC | PRN
Start: 2019-04-05 — End: 2019-05-14

## 2019-04-05 MED ORDER — PANTOPRAZOLE 40 MG TABLET,DELAYED RELEASE
40.00 mg | DELAYED_RELEASE_TABLET | Freq: Every morning | ORAL | Status: DC
Start: 2019-04-06 — End: 2019-04-06
  Administered 2019-04-06: 40 mg via ORAL
  Filled 2019-04-05: qty 1

## 2019-04-05 MED ORDER — TRAZODONE 50 MG TABLET
50.00 mg | ORAL_TABLET | Freq: Every evening | ORAL | Status: DC | PRN
Start: 2019-04-05 — End: 2019-04-06

## 2019-04-05 MED ORDER — FLUTICASONE PROPIONATE 50 MCG/ACTUATION NASAL SPRAY,SUSPENSION
1.0000 | Freq: Every day | NASAL | Status: DC
Start: 2019-04-05 — End: 2019-04-06
  Administered 2019-04-05 – 2019-04-06 (×2): 1 via NASAL
  Filled 2019-04-05: qty 16

## 2019-04-05 MED ORDER — LIDOCAINE (PF) 100 MG/5 ML (2 %) INTRAVENOUS SYRINGE
INJECTION | Freq: Once | INTRAVENOUS | Status: DC | PRN
Start: 2019-04-05 — End: 2019-04-05
  Administered 2019-04-05: 100 mg via INTRAVENOUS

## 2019-04-05 MED ORDER — ENOXAPARIN 40 MG/0.4 ML SUBCUTANEOUS SYRINGE
40.0000 mg | INJECTION | SUBCUTANEOUS | Status: DC
Start: 2019-04-06 — End: 2019-04-06
  Administered 2019-04-06: 40 mg via SUBCUTANEOUS
  Filled 2019-04-05: qty 0.4

## 2019-04-05 MED ORDER — PHENYLEPHRINE 50 MG/250 ML (200 MCG/ML) IN 0.9 % SODIUM CHLORIDE IV
INTRAVENOUS | Status: DC | PRN
Start: 2019-04-05 — End: 2019-04-05
  Administered 2019-04-05: 0 ug/kg/min via INTRAVENOUS
  Administered 2019-04-05: .2 ug/kg/min via INTRAVENOUS
  Administered 2019-04-05: .1 ug/kg/min via INTRAVENOUS

## 2019-04-05 MED ORDER — LIDOCAINE 1 %-EPINEPHRINE 1:100,000 INJECTION SOLUTION
5.0000 mL | Freq: Once | INTRAMUSCULAR | Status: DC | PRN
Start: 2019-04-05 — End: 2019-04-05
  Administered 2019-04-05 (×2): 10 mL via INTRAMUSCULAR

## 2019-04-05 MED ORDER — ROCURONIUM 10 MG/ML INTRAVENOUS SOLUTION
Freq: Once | INTRAVENOUS | Status: DC | PRN
Start: 2019-04-05 — End: 2019-04-05
  Administered 2019-04-05: 10 mg via INTRAVENOUS

## 2019-04-05 MED ORDER — SODIUM CHLORIDE 0.9 % (FLUSH) INJECTION SYRINGE
2.0000 mL | INJECTION | Freq: Three times a day (TID) | INTRAMUSCULAR | Status: DC
Start: 2019-04-05 — End: 2019-04-06
  Administered 2019-04-05 (×2): 2 mL
  Administered 2019-04-06: 06:00:00 0 mL

## 2019-04-05 MED ORDER — GABAPENTIN 300 MG CAPSULE
600.00 mg | ORAL_CAPSULE | Freq: Two times a day (BID) | ORAL | Status: DC
Start: 2019-04-05 — End: 2019-04-06
  Administered 2019-04-05 – 2019-04-06 (×2): 600 mg via ORAL
  Filled 2019-04-05 (×2): qty 2

## 2019-04-05 MED ORDER — FENTANYL (PF) 50 MCG/ML INJECTION SOLUTION
Freq: Once | INTRAMUSCULAR | Status: DC | PRN
Start: 2019-04-05 — End: 2019-04-05
  Administered 2019-04-05: 100 ug via INTRAVENOUS

## 2019-04-05 MED ORDER — LACTATED RINGERS INTRAVENOUS SOLUTION
INTRAVENOUS | Status: DC
Start: 2019-04-05 — End: 2019-04-05
  Administered 2019-04-05: 09:00:00 via INTRAVENOUS

## 2019-04-05 MED ORDER — FLUOXETINE 20 MG CAPSULE
20.00 mg | ORAL_CAPSULE | Freq: Two times a day (BID) | ORAL | Status: DC
Start: 2019-04-05 — End: 2019-04-05

## 2019-04-05 MED ORDER — SODIUM CHLORIDE 0.9 % (FLUSH) INJECTION SYRINGE
2.0000 mL | INJECTION | Freq: Three times a day (TID) | INTRAMUSCULAR | Status: DC
Start: 2019-04-05 — End: 2019-04-06
  Administered 2019-04-05: 2 mL
  Administered 2019-04-05 – 2019-04-06 (×2): 0 mL

## 2019-04-05 MED ORDER — ACETAMINOPHEN 1,000 MG/100 ML (10 MG/ML) INTRAVENOUS SOLUTION
Freq: Once | INTRAVENOUS | Status: DC | PRN
Start: 2019-04-05 — End: 2019-04-05
  Administered 2019-04-05: 1000 mg via INTRAVENOUS

## 2019-04-05 MED ORDER — METHOCARBAMOL 500 MG TABLET
500.00 mg | ORAL_TABLET | Freq: Four times a day (QID) | ORAL | Status: DC
Start: 2019-04-05 — End: 2019-04-06
  Administered 2019-04-05 – 2019-04-06 (×3): 500 mg via ORAL
  Filled 2019-04-05 (×3): qty 1

## 2019-04-05 MED ORDER — LACTATED RINGERS INTRAVENOUS SOLUTION
INTRAVENOUS | Status: DC | PRN
Start: 2019-04-05 — End: 2019-04-05
  Administered 2019-04-05: 11:00:00 via INTRAVENOUS

## 2019-04-05 MED ORDER — HYDROXYZINE HCL 10 MG/5 ML ORAL SOLUTION
50.00 mg | Freq: Every evening | ORAL | Status: DC
Start: 2019-04-05 — End: 2019-04-06
  Administered 2019-04-05: 21:00:00 50 mg via ORAL
  Filled 2019-04-05 (×2): qty 25

## 2019-04-05 MED ORDER — ONDANSETRON HCL (PF) 4 MG/2 ML INJECTION SOLUTION
4.0000 mg | Freq: Four times a day (QID) | INTRAMUSCULAR | Status: DC | PRN
Start: 2019-04-05 — End: 2019-04-06

## 2019-04-05 MED ORDER — ACETAMINOPHEN 325 MG TABLET
650.00 mg | ORAL_TABLET | ORAL | Status: DC | PRN
Start: 2019-04-05 — End: 2019-04-06

## 2019-04-05 MED ORDER — SODIUM CHLORIDE 0.9 % INTRAVENOUS SOLUTION
INTRAVENOUS | Status: DC | PRN
Start: 2019-04-05 — End: 2019-04-05
  Administered 2019-04-05 (×4): via INTRAVENOUS

## 2019-04-05 MED ORDER — MIDAZOLAM 1 MG/ML INJECTION SOLUTION
Freq: Once | INTRAMUSCULAR | Status: DC | PRN
Start: 2019-04-05 — End: 2019-04-05
  Administered 2019-04-05: 2 mg via INTRAVENOUS

## 2019-04-05 MED ORDER — MICROFIBRILLAR COLLAGEN HEMOSTAT POWDER
CUTANEOUS | Status: AC
Start: 2019-04-05 — End: 2019-04-05
  Filled 2019-04-05: qty 1

## 2019-04-05 MED ORDER — TOLTERODINE 2 MG TABLET
2.0000 mg | ORAL_TABLET | Freq: Every day | ORAL | Status: DC
Start: 2019-04-06 — End: 2019-04-06
  Administered 2019-04-06: 08:00:00 2 mg via ORAL
  Filled 2019-04-05: qty 1

## 2019-04-05 MED ORDER — DEXAMETHASONE SODIUM PHOSPHATE 4 MG/ML INJECTION SOLUTION
Freq: Once | INTRAMUSCULAR | Status: DC | PRN
Start: 2019-04-05 — End: 2019-04-05
  Administered 2019-04-05: 10 mg via INTRAVENOUS

## 2019-04-05 MED ORDER — HYDROCODONE 5 MG-ACETAMINOPHEN 325 MG TABLET
1.0000 | ORAL_TABLET | ORAL | Status: DC | PRN
Start: 2019-04-05 — End: 2019-04-06
  Administered 2019-04-05 – 2019-04-06 (×3): 1 via ORAL
  Filled 2019-04-05 (×3): qty 1

## 2019-04-05 MED ORDER — MICROFIBRILLAR COLLAGEN HEMOSTAT POWDER
1.0000 g | Freq: Once | CUTANEOUS | Status: DC | PRN
Start: 2019-04-05 — End: 2019-04-05
  Administered 2019-04-05: 1 g via TOPICAL

## 2019-04-05 MED ORDER — FENTANYL (PF) 50 MCG/ML INJECTION SOLUTION
12.5000 ug | INTRAMUSCULAR | Status: DC | PRN
Start: 2019-04-05 — End: 2019-04-05

## 2019-04-05 MED ORDER — FLUOXETINE 20 MG/5 ML (4 MG/ML) ORAL SOLUTION
20.00 mg | Freq: Two times a day (BID) | ORAL | Status: DC
Start: 2019-04-05 — End: 2019-04-06
  Administered 2019-04-05 – 2019-04-06 (×2): 20 mg via ORAL
  Filled 2019-04-05 (×3): qty 5

## 2019-04-05 MED ORDER — MONTELUKAST 10 MG TABLET
10.00 mg | ORAL_TABLET | Freq: Every evening | ORAL | Status: DC
Start: 2019-04-05 — End: 2019-04-06
  Administered 2019-04-05: 10 mg via ORAL
  Filled 2019-04-05: qty 1

## 2019-04-05 MED ADMIN — ePHEDrine sulfate 50 mg/mL injection solution: INTRAVENOUS | @ 11:00:00

## 2019-04-05 MED ADMIN — sodium chloride 0.9 % intravenous solution: INTRAVENOUS | @ 11:00:00

## 2019-04-05 MED ADMIN — propofol 10 mg/mL intravenous emulsion: INTRAVENOUS | @ 11:00:00

## 2019-04-05 MED ADMIN — sodium chloride 0.9 % intravenous solution: INTRAVENOUS | @ 13:00:00

## 2019-04-05 MED ADMIN — fentaNYL (PF) 50 mcg/mL injection solution: INTRAVENOUS | @ 14:00:00

## 2019-04-05 MED ADMIN — phenylephrine 50 mg/250 mL (200 mcg/mL) in 0.9 % sodium chloride IV: INTRAVENOUS | @ 12:00:00

## 2019-04-05 MED ADMIN — HYDROcodone 5 mg-acetaminophen 325 mg tablet: ORAL | @ 19:00:00

## 2019-04-05 MED ADMIN — sodium chloride 0.9 % (flush) injection syringe: @ 09:00:00

## 2019-04-05 MED ADMIN — gabapentin 300 mg capsule: ORAL | @ 21:00:00

## 2019-04-05 MED ADMIN — remifentaniL 1 mg intravenous solution: INTRAVENOUS | @ 11:00:00

## 2019-04-05 MED ADMIN — FLUoxetine 20 mg/5 mL (4 mg/mL) oral solution: ORAL | @ 21:00:00

## 2019-04-05 MED ADMIN — lidocaine 1 %-epinephrine 1:100,000 injection solution: INTRAMUSCULAR | @ 11:00:00

## 2019-04-05 MED ADMIN — sodium chloride 0.9 % (flush) injection syringe: @ 21:00:00

## 2019-04-05 MED ADMIN — methocarbamoL 500 mg tablet: ORAL | @ 17:00:00

## 2019-04-05 MED ADMIN — midazolam 1 mg/mL injection solution: INTRAVENOUS | @ 11:00:00

## 2019-04-05 MED ADMIN — phenylephrine 50 mg/250 mL (200 mcg/mL) in 0.9 % sodium chloride IV: INTRAVENOUS | @ 13:00:00

## 2019-04-05 MED ADMIN — lactated Ringers intravenous solution: INTRAVENOUS | @ 15:00:00

## 2019-04-05 MED ADMIN — ePHEDrine sulfate 50 mg/mL injection solution: INTRAVENOUS | @ 12:00:00

## 2019-04-05 MED ADMIN — sodium chloride 0.9 % (flush) injection syringe: @ 14:00:00

## 2019-04-05 MED ADMIN — HYDROcodone 5 mg-acetaminophen 325 mg tablet: ORAL | @ 14:00:00

## 2019-04-05 MED ADMIN — fentaNYL (PF) 50 mcg/mL injection solution: INTRAVENOUS | @ 11:00:00

## 2019-04-05 MED ADMIN — succinylcholine(PF)200 mg/10 mL(20 mg/mL)-NaCl,iso intravenous syringe: INTRAVENOUS | @ 11:00:00

## 2019-04-05 MED ADMIN — rocuronium 10 mg/mL intravenous solution: INTRAVENOUS | @ 11:00:00

## 2019-04-05 MED ADMIN — lactated Ringers intravenous solution: INTRAVENOUS | @ 11:00:00

## 2019-04-05 MED ADMIN — methocarbamoL 500 mg tablet: ORAL | @ 21:00:00

## 2019-04-05 MED ADMIN — microfibrillar collagen hemostat powder: TOPICAL | @ 13:00:00

## 2019-04-05 MED ADMIN — lactated Ringers intravenous solution: INTRAVENOUS | @ 09:00:00

## 2019-04-05 SURGICAL SUPPLY — 45 items
APPLIER PREM SRGCLP II SUP INTLK 9.75IN ATO INTERNAL CLIP VAS LF  DISP ENDOS RADGR MRK 20 MED TI (ENDOSCOPIC SUPPLIES) IMPLANT
APPLIER PREM SRGCLP III 9IN 20 CLIP TI SM INTERNAL CLIP DISP (ENDOSCOPIC SUPPLIES) ×1 IMPLANT
APPLIER SURGICLIP MED TI 9.75_134051 6EA/BX (INSTRUMENTS ENDOMECHANICAL)
APPLIER SURGICLIP MULTIPLE SM_133650 6EA/BX (INSTRUMENTS ENDOMECHANICAL) ×1
BLANKET MISTRAL-AIR ADULT LWR BODY 55.9X40.2IN FRC AIR HI VOL BLWR INTUITIVE CONTROL PNL LRG LED (MISCELLANEOUS PT CARE ITEMS) ×1 IMPLANT
BLANKET WARMER 55.9INW X 40.2I_LOWER BODY MISTRAL-AIR (MISCELLANEOUS PT CARE ITEMS) ×1
CLIP INTNL WECK HZN TI SM_CHEVRON 6 CART LGT (INSTRUMENTS ENDOMECHANICAL) ×5
CLIP SM CHEVRON INTERNAL WECK HORIZON TI 6 CART LGT TRIANGULATE XSECT (ENDOSCOPIC SUPPLIES) ×5 IMPLANT
CONV USE 338643 - PACK SURG HEAD NK NONST DISP LF (CUSTOM TRAYS & PACK) ×1 IMPLANT
CONV USE ITEM 337890 - PACK SURG BSIN 2 STRL LF  DISP (CUSTOM TRAYS & PACK) ×1 IMPLANT
COVER WAND RFD STRL 50EA/CS_01-0020 (EQUIPMENT MINOR)
COVER WND RF DETECT STRL CLR EQP (EQUIPMENT MINOR) IMPLANT
DRAIN FLAT PERFERATED_10MM X 4MM 0070440 (Drains/Resovoirs)
DRAIN INCS 20CMX10MM SIL RADOPQ FULL PRFR HBLS STRL LF  FLAT DISP (Drains/Resovoirs) IMPLANT
DRAPE POUCH INSTRU 2 POCKET_1018 10EA/BX (EQUIPMENT MINOR) ×1
DRESS TRNSPR 4.75X4IN ADH FILM WTPRF DIAMOND PTRN TGDRM STRL LF (WOUND CARE SUPPLY) ×1 IMPLANT
DRESSING TEGADERM 4 X 4_1686 TEGADERM 50/BX (WOUND CARE/ENTEROSTOMAL SUPPLY) ×1
DUPE USE ITEM 319476 - SUTURE 4-0 PS2 MONOCRYL + MTPS_27IN UNDYED MONOF ABS (SUTURE/WOUND CLOSURE) ×1 IMPLANT
ELECTRODE ESURG 360D BLADE PNC_L STD 10FT 3/32IN VLAB STRL (CAUTERY SUPPLIES) ×1
ELECTRODE ESURG 360D PNCL STD 10FT 3/32IN VLAB STRL DISP SMOKE EVAC ATTACH CORD HLSTR (CAUTERY SUPPLIES) ×1 IMPLANT
ELECTRODE ESURG NEEDLE 4CM 3/32IN COLORADO STRL TUNG DISP INSL STR SLEEVE MCDSCT (NEEDLES & SYRINGE SUPPLIES) ×1 IMPLANT
ELECTRODE ESURG NEEDLE 4CM 3/3_2IN COLORADO STRL TUNG DISP (NEEDLES & SYRINGE SUPPLIES) ×1
GOWN SURG XL AAMI L3 NONREINFO_RCE HKLP CLSR STRL LTX PNK SMS (DGOW)
GOWN SURG XL L3 NONREINFORCE HKLP CLSR STRL LTX PNK SMS 47IN (DGOW) IMPLANT
HOOK RETRACTR 14X16MM LONESTAR SM CURVE BLUNT 2 FINGER ELAS STAY RAKE SLF RTN STRL LF  DISP (ENDOSCOPIC SUPPLIES) ×1 IMPLANT
HOOK RETRACTR 14X16MM LONESTAR_SM CURVE BLUNT 2 FINGER ELAS (INSTRUMENTS ENDOMECHANICAL) ×1
PACK BASIN DBL CUSTOM (CUSTOM TRAYS & PACK) ×1
PACK CUSTOM HEAD AND NECK (CUSTOM TRAYS & PACK) ×1
PACK SURG TBG STRL DISP 30IN SIL LF (Connecting Tubes/Misc) ×1 IMPLANT
POUCH 11X7IN 2 ADH STRP 2 CMPRT STRDRP INSTR PLASTIC STRL (EQUIPMENT MINOR) ×1 IMPLANT
RESERVOIR DRAIN SIL JP BULB 100CC STRL LF  DISP (WOUND CARE SUPPLY) IMPLANT
RESERVOIR DRAIN SIL JP BULB 10_0CC STRL LF DISP (WOUND CARE/ENTEROSTOMAL SUPPLY)
SPONGE DISSECTOR KITTNER STRL MDS73512 1/4X9/16IN 100PK5/CS (WOUND CARE/ENTEROSTOMAL SUPPLY) ×7
SPONGE LAP 9/16X.25IN DSCT RADOPQ FBR STRL (WOUND CARE SUPPLY) ×7 IMPLANT
STIM HANDHELD LOCATOR VRSTIM3 NERVE STD SUBDERMIS (SURGICAL INSTRUMENTS) ×1 IMPLANT
STIM HANDHELD LOCATOR VRSTIM3_NERVE STD SUBDERMIS LF (INSTRUMENTS) ×1
SUTURE 4-0 PS2 MONOCRYL + MTPS_27IN UNDYED MONOF ABS (SUTURE/WOUND CLOSURE) ×1
SUTURE SILK 2-0 SH PERMAHAND 30IN BLK BRD NONAB (SUTURE/WOUND CLOSURE) IMPLANT
SUTURE SILK 2-0 SH PERMAHAND 3_0IN BLK BRD NONAB (SUTURE/WOUND CLOSURE)
TAPE ADH 3IN 1538-3 BX/4 ROLLS (MED/SURG TAPES) ×2 IMPLANT
TRAY CATH 16FR LUBRISIL CMP CR 2 CONN ADV FOLEY SAF FLOW URMTR SILVER LF (Drains/Resovoirs) IMPLANT
TRAY CATH 16FR LUBRISIL CMP CR_2 CONN ADV FOLEY SAF FLOW (Drains/Resovoirs)
TRAY SKIN SCRUB 8IN VNYL COTTON 6 WNG 6 SPONGE STICK 2 TIP APPL DRY STRL LF (KITS & TRAYS (DISPOSABLE)) ×1 IMPLANT
TRAY SURG PREP SCR CR ESTM (KITS & TRAYS (DISPOSABLE)) ×1
TUBING SILICONE 30IN (Connecting Tubes/Misc) ×1

## 2019-04-05 NOTE — H&P (Signed)
[facility]  OTOLARYNGOLOGY DEPARTMENT  H&P    Date: 04/05/2019  Name: [name], 63 y.o. female  MRN: [id number]  DOB: 18-Dec-1955    HPI: 63 year old female with bilateral subcentimeter thyroid nodules as well as a dominant 1.7 cm nodule on the right. She has a sister with history of papillary thyroid carcinoma.     Past Medical History:   Diagnosis Date   . Anxiety    . Arthritis    . Cancer (CMS HCC)     internal skin cancer   . Depression    . Dyspnea on exertion    . Essential hypertension 02/16/2017    patient denies   . GERD (gastroesophageal reflux disease)     controlled w/med   . Headache    . Heart murmur 1979    benign   . Hyperlipidemia LDL goal < 100 08/29/2013   . MINOR CAD (coronary artery disease) 11/10/2012    no treatment other than cholesterol medications; follows regularly with Dr. Rodney Booze   . Multinodular goiter    . Muscle weakness     neck    . Obesity    . Peripheral edema    . Rash     face only eczema   . Rheumatic fever 16    no complications   . Squamous cell carcinoma 02/06/2015    right side of neck   . Vaginal prolapse 2012    surgery improved significantly   . Wears glasses          Past Surgical History:   Procedure Laterality Date   . Colonoscopy  01/06/09   . Gastroscopy  03/05/2010   . Gastroscopy with biopsy  03/05/2010   . Hx adenoidectomy     . Hx ankle fracture tx  2007   . Hx appendectomy     . Hx cholecystectomy     . Hx colonoscopy     . Hx cystocele repair  09/24/09   . Hx hand surgery  2012   . Hx oophorectomy     . Hx tonsillectomy     . Hx total vaginal hysterectomy  1979   . Hx wrist fracture tx  2007   . Pb revise ulnar nerve at elbow Left 1979   . Pb upper gi endoscopy,biopsy  12/19/07     Social History     Socioeconomic History   . Marital status: Divorced     Spouse name: Not on file   . Number of children: Not on file   . Years of education: Not on file   . Highest education level: Not on file   Occupational History   . 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 Smoking status: Former Smoker     Packs/day: 2.00     Years: 40.00     Pack years: 80.00     Types: Cigarettes     Last attempt to quit: 01/22/2005     Years since quitting: 14.2   . Smokeless tobacco: Never Used   Substance and Sexual Activity   . Alcohol use: No     Alcohol/week: 0.0 standard drinks   . Drug use: No   . Sexual activity: Yes     Partners: Male   Other Topics Concern   . Uses Cane No   . Uses walker No   . Uses wheelchair No   .  Right hand dominant Yes   . Left hand dominant No   . Ambidextrous No   . Ability to Walk 1 Flight of Steps without SOB/CP Yes   . Routine Exercise Yes     Comment: walk   . Ability to Walk 2 Flight of Steps without SOB/CP No     Comment: shortness of breath, no cp    . Ability To Do Own ADL's Yes   . Shift Work No   . Unusual Sleep-Wake Schedule No     Allergies   Allergen Reactions   . Cymbalta [Duloxetine]      Rash on feet and hands   . Blue Dye      Told to avoid due to patch test result by Simone Curia   . Flavoring Agent    . Lipitor [Atorvastatin] Myalgia     Muscle pain   . Nickel      Blisters     . Codeine Itching     RASH   . Mobic [Meloxicam] Nausea/ Vomiting     Nervous and shakes    . Naprosyn [Naproxen] Nausea/ Vomiting   . Nsaids (Non-Steroidal Anti-Inflammatory Drug) Nausea/ Vomiting     Family Medical History:     Problem Relation (Age of Onset)    Bipolar Disorder Daughter    Breast Cancer Paternal Aunt, Paternal Aunt, Paternal Aunt, Paternal 12, Paternal 71, Other    Cancer Paternal Aunt, Paternal 33, Paternal 3, Paternal 94, Paternal 48, Other (41)    Congestive Heart Failure Father (52)    Coronary Artery Disease Father, Mother (77)    Diabetes Mother    Heart Attack Father, Sister    High Cholesterol Mother    Hypertension (High Blood Pressure) Mother, Brother, Sister, Sister    Kidney Disease Sister    Leukemia Paternal Uncle, Paternal Uncle    Stroke  Paternal Grandmother    Thyroid Disease Sister            No current outpatient medications on file.       Physical Examination: No acute distress.  No respiratory distress no stridor.  No cyanosis of the upper extremities.  No epistaxis.  No otorrhea.  Moves all extremities.    Pre-Surgical H & P.  1. Change in medications: No      Comments: None    2.  Patient continues to be appropiate candidate for planned surgical procedure. YES    Fernande Boyden, MD 04/05/2019 10:18       I saw and examined the patient.  I reviewed the resident's note.  I agree with the findings and plan of care as documented in the resident's note.  Any exceptions/additions are edited/noted. Patient with right thyroid nodule and AUS. Discussed surgery as right hemithyroid and risks of pain, bleeding, recurrent laryngeal nerve injury, need for additional surgery. Patient understood and wished to proceed.     Delfino Lovett, MD

## 2019-04-05 NOTE — Brief Op Note (Signed)
[facility]                                                     BRIEF OPERATIVE NOTE    Patient Name: [name], [name] Number: [id number]  Date of Service: 04/05/2019   Date of Birth: 1955/12/27    All elements must be documented.    Pre-Operative Diagnosis: bilateral thyroid nodules, largest on right    Post-Operative Diagnosis: Same   Procedure(s)/Description:  Right hemithyroidectomy with intraoperative neuromonitoring   Findings/Complexity (inherent to the procedure performed): Procedure performed as listed. Good stimulation of right recurrent laryngeal nerve at completion of operation.      Attending Surgeon: Posey Pronto   Assistant(s): Mancel Bale     Anesthesia Type: General  Estimated Blood Loss:  less than 50 ml  Blood Given: none  Fluids Given: crystalloid per anesthesia   Complications (not routinely expected or not inherent to difficulty/nature of procedure):none  Characteristic Event (routinely expected or inherent to the difficulty/nature of the procedure): none  Did the use of current and/or prior Anticoagulants impact the outcome of the case? no  Wound Class: Clean Wound: Uninfected operative wounds in which no inflammation occurred    Tubes: None  Drains: None  Specimens/ Cultures: right hemithyroid, suture in superior pole   Implants: none           Disposition: PACU - hemodynamically stable.  Condition: stable    Fernande Boyden, MD      Please discharge patient from PACU once discharge criteria are met.           I was present for all key and/or critical portions and immediately available at all times for the surgical portion of the case. Additionally, I was present for the entire viewing portion from insertion to removal of scope for the endoscopy portion of the case.    Delfino Lovett, MD 04/05/2019, 13:55

## 2019-04-05 NOTE — Anesthesia Postprocedure Evaluation (Signed)
Anesthesia Post Op Evaluation    Patient: [name]  Procedure(s):  HEMITHYROIDECTOMY    Last Vitals:Temperature: 36.4 C (97.5 F) (04/05/19 1336)  Heart Rate: (!) 107 (04/05/19 1336)  BP (Non-Invasive): (!) 150/81 (04/05/19 1336)  Respiratory Rate: (!) 21 (04/05/19 1336)  SpO2: 100 % (04/05/19 1336)      Patient location during evaluation: PACU       Patient participation: complete - patient participated  Level of consciousness: awake    Pain management: adequate  Airway patency: patent    Anesthetic complications: no  Cardiovascular status: acceptable  Respiratory status: acceptable  Hydration status: acceptable    PONV Status: Absent

## 2019-04-05 NOTE — Anesthesia Transfer of Care (Signed)
ANESTHESIA TRANSFER OF CARE   [name] is a 63 y.o. ,female, Weight: 98.5 kg (217 lb 2.5 oz)   had Procedure(s):  HEMITHYROIDECTOMY  performed  04/05/19   Primary Service: Delfino Lovett, MD    Past Medical History:   Diagnosis Date   . Anxiety    . Arthritis    . Cancer (CMS HCC)     internal skin cancer   . Depression    . Dyspnea on exertion    . Essential hypertension 02/16/2017    patient denies   . GERD (gastroesophageal reflux disease)     controlled w/med   . Headache    . Heart murmur 1979    benign   . Hyperlipidemia LDL goal < 100 08/29/2013   . MINOR CAD (coronary artery disease) 11/10/2012    no treatment other than cholesterol medications; follows regularly with Dr. Rodney Booze   . Multinodular goiter    . Muscle weakness     neck    . Obesity    . Peripheral edema    . Rash     face only eczema   . Rheumatic fever 16    no complications   . Squamous cell carcinoma 02/06/2015    right side of neck   . Vaginal prolapse 2012    surgery improved significantly   . Wears glasses       Allergy History as of 04/05/19     CODEINE       Noted Status Severity Type Reaction    12/10/15 0941 Pantojas, [place]  Active Low  Itching    Comments:  RASH     09/19/13 1430 Wallace Cullens Joy  Deleted Low  Itching    Comments:  RASH     09/04/09 Ginette Pitman  Active   Itching    Comments:  RASH     10/10/07   Active       Comments:  RASH           STRAWBERRY       Noted Status Severity Type Reaction    03/18/11 1634 Johnnette Barrios  Deleted       10/10/08 [name], MD  Deleted       10/10/07   Active             MELOXICAM       Noted Status Severity Type Reaction    11/24/18 0857 Kisner, Bishop Limbo, RN 06/26/09 Active Low  Nausea/ Vomiting    Comments:  Nervous and shakes      12/10/15 0941 Pantojas, Marita [place] 06/26/09 Active Low  Nausea/ Vomiting    06/26/09 Waldon Reining 06/26/09 Active   Nausea/ Vomiting          NICKEL       Noted Status Severity Type Reaction    11/24/18 0856 Kisner, Bishop Limbo, RN 07/24/12 Active  Topical     Comments:  Blisters       07/24/12 1041 [name], MD 07/24/12 Active  Topical           BLUE DYE       Noted Status Severity Type Reaction    07/24/12 1041 [name], MD 07/24/12 Active  Topical     Comments:  Told to avoid due to patch test result by Simone Curia           DULOXETINE       Noted Status Severity Type Reaction  07/24/12 1052 [name], MD 07/24/12 Active Medium Systemic     Comments:  Rash on feet and hands           NAPROXEN       Noted Status Severity Type Reaction    11/01/12 0913 Sanjuana Letters, MA 11/01/12 Active Low  Nausea/ Vomiting          ATORVASTATIN       Noted Status Severity Type Reaction    06/19/18 2149 Erskine Squibb, RX STUDENT 01/27/15 Active  Side Effect Myalgia    Comments:  Muscle pain     01/27/15 1035 [name], MD 01/27/15 Active  Side Effect     Comments:  Muscle pain           FLAVORING AGENT       Noted Status Severity Type Reaction    04/03/18 1905 Suella [place], MA 05/03/17 Active             NSAIDS (NON-STEROIDAL ANTI-INFLAMMATORY DRUG)       Noted Status Severity Type Reaction    11/24/18 0857 Kisner, Bishop Limbo, RN 05/03/17 Active Low  Nausea/ Vomiting    04/03/18 1905 Suella [place], MA 05/03/17 Active                 I completed my transfer of care / handoff to the receiving personnel during which we discussed:  Access, All key/critical aspects of case discussed, Antibiotics, Fluids/Product, PMHx, Gave opportunity for questions and acknowledgement of understanding, Expectation of post procedure, Analgesia and Airway                                                                    Last OR Temp: Temperature: 36.4 C (97.5 F)  ABG:  PCO2 (VENOUS)   Date Value Ref Range Status   02/19/2016 36.00 (L) 41.00 - 51.00 mm/Hg Final     PO2 (VENOUS)   Date Value Ref Range Status   02/19/2016 43.0 35.0 - 50.0 mm/Hg Final     POTASSIUM   Date Value Ref Range Status   03/05/2019 4.2 3.5 -  5.1 mmol/L Final   01/23/2015 4.3 3.5 - 5.1 mmol/L Final     KETONES   Date Value Ref Range Status   12/12/2017 Negative Negative mg/dL Final     WHOLE BLOOD POTASSIUM   Date Value Ref Range Status   02/19/2016 3.9 3.5 - 5.0 mmol/L Final     CALCIUM   Date Value Ref Range Status   03/05/2019 9.2 8.5 - 10.2 mg/dL Final   01/23/2015 9.8 8.5 - 10.4 mg/dL Final     Comment:     Test performed by [facility], [address], [place],  [place] [address]       Calculated P Axis   Date Value Ref Range Status   11/24/2018 44 degrees Final     Calculated R Axis   Date Value Ref Range Status   11/24/2018 -16 degrees Final     Calculated T Axis   Date Value Ref Range Status   11/24/2018 -4 degrees Final     IONIZED CALCIUM   Date Value Ref Range Status   02/19/2016 1.19 1.10 - 1.36 mmol/L Final     GLUCOSE,  POINT OF CARE   Date Value Ref Range Status   02/06/2015 104 70 - 105 mg/dL Final     HEMOGLOBIN   Date Value Ref Range Status   02/19/2016 13.9 12.0 - 18.0 g/dL Final     OXYHEMOGLOBIN   Date Value Ref Range Status   02/19/2016 83.5 (H) 40.0 - 80.0 % Final     CARBOXYHEMOGLOBIN   Date Value Ref Range Status   02/19/2016 1.9 0.0 - 2.5 % Final     MET-HEMOGLOBIN   Date Value Ref Range Status   02/19/2016 0.1 0.0 - 3.5 % Final     BASE EXCESS   Date Value Ref Range Status   02/19/2016 0.6 -3.0 - 3.0 mmol/L Final     BICARBONATE (VENOUS)   Date Value Ref Range Status   02/19/2016 25.0 22.0 - 26.0 mmol/L Final     %FIO2 (VENOUS)   Date Value Ref Range Status   02/19/2016 28.0 % Final     Airway:* No LDAs found *  Blood pressure (!) 150/81, pulse (!) 107, temperature 36.4 C (97.5 F), resp. rate (!) 21, height 1.651 m (5' 5" ), weight 98.5 kg (217 lb 2.5 oz), SpO2 100 %, not currently breastfeeding.

## 2019-04-05 NOTE — Care Plan (Signed)
Problem: Adult Inpatient Plan of Care  Goal: Plan of Care Review  Outcome: Ongoing (see interventions/notes)  Goal: Patient-Specific Goal (Individualized)  Outcome: Ongoing (see interventions/notes)  Goal: Absence of Hospital-Acquired Illness or Injury  Outcome: Ongoing (see interventions/notes)  Goal: Optimal Comfort and Wellbeing  Outcome: Ongoing (see interventions/notes)  Goal: Rounds/Family Conference  Outcome: Ongoing (see interventions/notes)     Problem: Depression  Goal: Improved Mood  Outcome: Ongoing (see interventions/notes)     Problem: Pain Acute  Goal: Optimal Pain Control  Outcome: Ongoing (see interventions/notes)     Problem: Fall Injury Risk  Goal: Absence of Fall and Fall-Related Injury  Outcome: Ongoing (see interventions/notes)       Patient admitted to 9E 963 from PACU today after right thyroidectomy. Patient alert and oriented x4. VSS, heart rate tachycardiac, baseline for patient. Respirations even and unlabored on room air. Patient tolerating a regular diet well. Denies N/V/D. Up with assist x1 to bathroom with nursing staff. Unsteady from anesthesia. High fall precautions initiated. Medicated per MAR. Updated on plan of care. Admission assessment complete. Patient educated on unit expectations and orientation. Bed is in locked/lowest position. Call bell within reach. Will continue to monitor.    Era Skeen, RN  04/05/2019, 17:54

## 2019-04-05 NOTE — OR Surgeon (Signed)
[facility]   OTOLARYNGOLOGY DEPARTMENT   OPERATIVE NOTE    Name: [name] 63 y.o. female   MRN: [id number]   DOB: Jun 18, 1956   Date of Surgery: 04/05/19     PRE/ POST OPERATIVE DIAGNOSIS:   1.  1.7 cm nodule in the right thyroid gland   2.  Sub-centimeter thyroid nodules bilaterally   3.  History of differentiated thyroid cancer in sibling   NAME OF PROCEDURES:   1. Right thyroid lobectomy with intraoperative monitoring  SURGEON: Lilly Cove MD (resident surgeon), Dr. Posey Pronto (staff)   ANESTHESIA: General via endotracheal tube.   OPERATIVE FINDINGS:   1. Palpable thyroid nodules, largest 1.7 cm. Otherwise grossly normal-appearing thyroid.    2. 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  3. Right recurrent laryngeal nerve was identified and stimulated at 0.64m at the conclusion of the case.  SPECIMENS: Right thyroid lobe with stitch in the superior portion of the gland.  DRAIN:  None  EBL:  minimal  COMPLICATIONS: None.   INDICATIONS FOR PROCEDURE: [name] a 63y.o. female with a history of multiple thyroid nodules bilaterally, the largest of which is 1.7 cm on the right. The patient was felt to be a good candidate for right thyroid lobectomy.     DESCRIPTION OF PROCEDURE:  The patient was met in the preoperative holding area and final questions were answered. Informed consent was verified and the patient was escorted to the operative suite, where general endotracheal anesthesia was induced. The head of the bed was then turned 180 degrees and a shoulder roll was placed. A head wrap and rigid goggles were also placed. The neck was inspected and a planned incision was marked in a neck crease with a marking pen.  This was then injected with 1% lidocaine with 1:100,000 epinephrine.  The neck was then prepped and draped in the usual fashion.  Prior to initiating surgery, a surgical pause was observed.     Using a 15 blade, the soft tissue and  skin of the neck were sharply incised and dissection was carried down to the platysma.  Then using Bovie cautery, the platysma was sharply divided, and an inferior sub-platysmal flap was raised. Dissection was carried down to the straps. The anterior jugular veins were identified and preserved bilaterally.  The straps were then divided in the midline and reflected laterally over the right side of the thyroid. Soft tissues were bluntly dissected off the thyroid using a Kittner, a McCabe forceps, and a bipolar electrocautery. The thyroid was freed laterally and inferiorly in this manner. The superior pole vessels were isolated, surgical clips were applied, and the vessels were divided as close to the surface of the thyroid as possible. The superior and inferior parathyroid glands were identified and carefully dissected off the thyroid lobe.  The right thyroid lobe was then delivered through the soft tissues of the neck and dissected layer by layer until it was reflected onto the trachea. It was reflected toward the right side of the trachea using a combination of blunt dissection with McCabe forceps and bipolar electrocautery. The recurrent laryngeal nerve was identified and stimulated well. The dissection was then carried towards Berry ligament and removed off the trachea using a combination of tonsil clamp and bipolar electrocautery. Once this was completed, the right lobe of the thyroid was removed from the surgical field and saved for permanent pathology with a suture in the superior pole.  The wound was then irrigated and suctioned dry. Hemostasis was obtained using bipolar cautery. The right recurrent laryngeal nerve stimulated well. No drain was placed. The straps were then reapproximated using 3-0 Vicryl. The platysma and deep dermal layers were also closed with 3-0 vicryl. A running Monocryl subcuticular stitch was then used to approximate the skin edges. The neck was then cleaned and dried. Mastisol and  Steri-Strips were placed onto the neck wound.    The patient was then turned over to anesthesia.  The patient was awakened and extubated without difficulty and transferred to PACU in stable condition. The patient tolerated the procedure well, without complication. Dr. Posey Pronto was present for the key and critical portions of the case.      Barbara Boyden, MD 04/05/2019 22:04        I was present for all key and/or critical portions and immediately available at all times for the surgical portion of the case. Additionally, I was present for the entire viewing portion from insertion to removal of scope for the endoscopy portion of the case.    Delfino Lovett, MD 04/06/2019, 09:17

## 2019-04-05 NOTE — Discharge Instructions (Signed)
SURGICAL DISCHARGE INSTRUCTIONS     Dr. Delfino Lovett, MD  performed your HEMITHYROIDECTOMY today at the [facility]:  Monday through Friday from 6 a.m. - 7 p.m.: [phone]  Between 7 p.m. - 6 a.m., weekends and holidays:  Call [facility] at [phone] or [phone].    PLEASE SEE WRITTEN HANDOUTS AS DISCUSSED BY YOUR NURSE:      SIGNS AND SYMPTOMS OF A WOUND / INCISION INFECTION   Be sure to watch for the following:   Increase in redness or red streaks near or around the wound or incision.   Increase in pain that is intense or severe and cannot be relieved by the pain medication that your doctor has given you.   Increase in swelling that cannot be relieved by elevation of a body part, or by applying ice, if permitted.   Increase in drainage, or if yellow / green in color and smells bad. This could be on a dressing or a cast.   Increase in fever for longer than 24 hours, or an increase that is higher than 101 degrees Fahrenheit (normal body temperature is 98 degrees Fahrenheit). The incision may feel warm to the touch.    **CALL YOUR DOCTOR IF ONE OR MORE OF THESE SIGNS / SYMPTOMS SHOULD OCCUR.    ANESTHESIA INFORMATION   ANESTHESIA -- ADULT PATIENTS:  You have received intravenous sedation / general anesthesia, and you may feel drowsy and light-headed for several hours. You may even experience some forgetfulness of the procedure. DO NOT DRIVE A MOTOR VEHICLE or perform any activity requiring complete alertness or coordination until you feel fully awake in about 24-48 hours. Do not drink alcoholic beverages for at least 24 hours. Do not stay alone, you must have a responsible adult available to be with you. You may also experience a dry mouth or nausea for 24 hours. This is a normal side effect and will disappear as the effects of the medication wear off.    REMEMBER   If you experience any difficulty breathing, chest pain, bleeding that you feel is excessive,  persistent nausea or vomiting or for any other concerns:  Call your physician Dr. Posey Pronto at [phone] or [phone]. You may also ask to have the General surgery doctor on call paged. They are available to you 24 hours a day.    SPECIAL INSTRUCTIONS / COMMENTS   See handouts.      FOLLOW-UP APPOINTMENTS   Please call patient services at [phone] or [phone] to schedule a date / time of return. They are open Monday - Friday from 7:30 am - 5:00 pm.

## 2019-04-06 DIAGNOSIS — E041 Nontoxic single thyroid nodule: Secondary | ICD-10-CM

## 2019-04-06 LAB — HISTORICAL SURGICAL PATHOLOGY SPECIMEN

## 2019-04-06 MED ADMIN — fluticasone propionate 50 mcg/actuation nasal spray,suspension: NASAL | @ 08:00:00

## 2019-04-06 MED ADMIN — cholecalciferol (vitamin D3) 25 mcg (1,000 unit) tablet: ORAL | @ 08:00:00

## 2019-04-06 MED ADMIN — tolterodine 2 mg tablet: ORAL | @ 08:00:00

## 2019-04-06 MED ADMIN — sodium chloride 0.9 % (flush) injection syringe: @ 06:00:00

## 2019-04-06 MED ADMIN — FLUoxetine 20 mg/5 mL (4 mg/mL) oral solution: ORAL | @ 08:00:00

## 2019-04-06 NOTE — Progress Notes (Signed)
[facility] - HEAD AND NECK SURGERY  INPATIENT PROGRESS NOTE             Current Date: 04/06/2019, 05:47  Name: [name], 63 y.o. female  MRN: [id number]  DOB: 16-Apr-1956  Date of Admission: 04/05/2019    SUBJECTIVE:   No acute issues overnight. Afebrile. Vitals stable.     OBJECTIVE:   Vitals:   Temp (24hrs) Max:36.9 C (98.4 F)  Temperature: 36.5 C (97.7 F)  BP (Non-Invasive): 128/70  MAP (Non-Invasive): 76 mmHG  Heart Rate: 65  Respiratory Rate: 18  SpO2: 93 %   Today's Physical Exam:  NAD, breathing comfortably.   Neck incision c/d/i, steristrips intact, neck soft  I/O:    I/O: Last 24 hours  06/11 0700 - 06/12 0659  In: 1470 [P.O.:80; I.V.:1390]  Out: -     I/O: Last Shift  06/11 1900 - 06/12 0659  In: 80 [P.O.:80]  Out: -         Labs/Imaging/Studies:  Lab Results   Component Value Date    WBC 6.0 03/05/2019    WBC 7.0 01/23/2015    HGB 14.0 03/05/2019    HGB 14.0 01/23/2015    HCT 42.5 03/05/2019    HCT 42.2 01/23/2015    PLTCNT 380 03/05/2019    PLTCNT 392 01/23/2015    PMNS 62 12/12/2017    PMNS 54 11/28/2014       No results for input(s): SODIUM, POTASSIUM, CHLORIDE, BICARBONATE, BUN, CREATININE, GLUCOSE, CALCIUM, MAGNESIUM, PHOSPHORUS in the last 72 hours.       ASSESSMENT & PLAN:  [name] is a 63 y.o. female,  LOS: 1 day  who is 1 Day Post-Op s/p right hemithyroidectomy.     Plan:   Vitals q 4  Regular diet  Pain control: acetaminophen, Norco  Ambulate as tolerated    Annye English, MD  04/06/2019, 05:47
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
## 2019-04-06 NOTE — Discharge Summary (Signed)
[facility]  DISCHARGE SUMMARY    PATIENT NAME:  [name], [name]  MRN:  [id number]  DOB:  27-Aug-1956    ENCOUNTER DATE:  04/05/2019  INPATIENT ADMISSION DATE:   DISCHARGE DATE:  04/06/2019    ATTENDING PHYSICIAN: Delfino Lovett, MD  SERVICE: OTOLARYNGOLOGY  PRIMARY CARE PHYSICIAN: Dennison Mascot, MD     LAY CAREGIVER:  ,  ,      PRIMARY DISCHARGE DIAGNOSIS:   Active Hospital Problems    Diagnosis Date Noted   . S/P thyroidectomy [Z98.890] 04/05/2019      Resolved Hospital Problems   No resolved problems to display.     Active Non-Hospital Problems    Diagnosis Date Noted   . Encounter for HCV screening test for high risk patient 04/04/2019   . Lung nodule < 6cm on CT 07/31/2018   . 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 Acute suppurative otitis media without spontaneous rupture of ear drum 05/26/2018   . Neuropathy (CMS HCC) 05/04/2017   . PTSD (post-traumatic stress disorder) 05/04/2017   . Recurrent major depressive disorder, in partial remission (CMS [place]) 05/04/2017   . History of rheumatic fever 02/16/2017   . Essential hypertension 02/16/2017   . Multiple pulmonary nodules determined by computed tomography of lung 02/23/2016   . Dysuria 02/20/2016   . Chest pain 02/19/2016   . Urinary frequency 02/19/2016   . Squamous cell carcinoma 02/06/2015   . Mass of parotid gland, 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   . Otitis media of both ears 09/02/2013   . Hyperlipidemia LDL goal < 100 08/29/2013   . MINOR CAD (coronary artery disease) 11/10/2012   . Left knee pain 10/04/2012   . Chest pain at rest 10/04/2012   . Osteoarthritis, knee 09/07/2011   . Carpal tunnel syndrome on right 12/15/2010   . Well woman exam 06/04/2010   . Lichen sclerosus et atrophicus of the vulva 06/04/2010   . Neck pain 02/11/2010   . Microscopic hematuria 10/10/2008   . Multinodular goiter    . Patellofemoral pain syndrome 05/03/2008   . Major depressive disorder, recurrent episode (CMS [place]) 12/21/2006   . GERD not well controlled  07/23/2004   . Migraine Headaches 02/28/2001        DISCHARGE MEDICATIONS:     Current Discharge Medication List      START taking these medications.      Details   HYDROcodone-acetaminophen 5-325 mg Tablet  Commonly known as:  NORCO   1 Tab, Oral, EVERY 4 HOURS PRN  Qty:  20 Tab  Refills:  0        CONTINUE these medications which have CHANGED during your visit.      Details   acetaminophen 325 mg Tablet  Commonly known as:  TYLENOL  What changed:     when to take this   reasons to take this   650 mg, Oral, 3 TIMES DAILY  Qty:  180 Tab  Refills:  0     Azelaic Acid 20 % Cream  What changed:     when to take this   reasons to take this   Topical, 2 TIMES DAILY  Qty:  50 g  Refills:  0     * clobetasoL 0.05 % Ointment  Commonly known as:  TEMOVATE  What changed:  Another medication with the same name was changed. Make sure you understand how and when to take each.   Topical  Refills:  0     *  clobetasoL 0.05 % Ointment  Commonly known as:  TEMOVATE  What changed:     when to take this   reasons to take this   Topical, 2 TIMES DAILY, To rash on hands and feet  Qty:  60 g  Refills:  0     * Halobetasol Propionate 0.05 % Cream  Commonly known as:  ULTRAVATE  What changed:  Another medication with the same name was changed. Make sure you understand how and when to take each.   Topical  Refills:  0     * Halobetasol Propionate 0.05 % Cream  Commonly known as:  ULTRAVATE  What changed:     when to take this   reasons to take this   Apply  topically. Three times a week  Qty:  15 g  Refills:  2     * Metronidazole 0.75 % Cream  Commonly known as:  METROCREAM  What changed:  Another medication with the same name was changed. Make sure you understand how and when to take each.   Topical  Refills:  0     * Metronidazole 0.75 % Cream  Commonly known as:  METROCREAM  What changed:     when to take this   reasons to take this   Topical, 2 TIMES DAILY  Qty:  45 g  Refills:  1     * triamcinolone acetonide 0.1 %  Ointment  Commonly known as:  ARISTOCORT A  What changed:  Another medication with the same name was changed. Make sure you understand how and when to take each.   Topical  Refills:  0     * triamcinolone acetonide 0.1 % Ointment  Commonly known as:  ARISTOCORT A  What changed:     when to take this   reasons to take this   Apply Topically, 2 TIMES DAILY  Qty:  80 g  Refills:  2         * This list has 8 medication(s) that are the same as other medications prescribed for you. Read the directions carefully, and ask your doctor or other care provider to review them with you.            CONTINUE these medications - NO CHANGES were made during your visit.      Details   Aquaphor Ointment  Generic drug:  mineral oil-hydrophil petrolat   Topical  Refills:  0     aspirin 81 mg Tablet, Delayed Release (E.C.)  Commonly known as:  ECOTRIN   81 mg, Oral, DAILY  Refills:  0     cholecalciferol (vitamin D3) 25 mcg (1,000 unit) Tablet   1,000 Units, Oral, DAILY  Qty:  30 Tab  Refills:  3     diclofenac sodium 75 mg Tablet, Delayed Release (E.C.)  Commonly known as:  VOLTAREN   75 mg, Oral, 2 TIMES DAILY  Qty:  30 Tab  Refills:  0     erythromycin 5 mg/gram (0.5 %) Ointment  Commonly known as:  ROMYCIN   Right Eye, 2 TIMES DAILY  Qty:  1 g  Refills:  0     FLUoxetine 20 mg Tablet  Commonly known as:  SARAFEM   20 mg, Oral, 2 TIMES DAILY  Qty:  60 Tab  Refills:  5     fluticasone propionate 50 mcg/actuation Spray, Suspension  Commonly known as:  FLONASE   1 Spray, Each Nostril, DAILY  Qty:  16 g  Refills:  3     gabapentin 600 mg Tablet  Commonly known as:  NEURONTIN   600 mg, Oral, 2 TIMES DAILY  Qty:  60 Tab  Refills:  4     hydrOXYzine HCL 50 mg Tablet  Commonly known as:  ATARAX   TAKE 1 TABLET BY MOUTH EVERY NIGHT  Qty:  30 Tab  Refills:  1     methocarbamoL 500 mg Tablet  Commonly known as:  ROBAXIN   500 mg, Oral, 4 TIMES DAILY, For muscle spasms  Qty:  60 Tab  Refills:  0     montelukast 10 mg Tablet  Commonly known as:   SINGULAIR   10 mg, Oral, EVERY EVENING  Qty:  30 Tab  Refills:  3     nitroGLYCERIN 0.4 mg Tablet, Sublingual  Commonly known as:  NITROSTAT   0.4 mg, Sublingual, EVERY 5 MIN PRN, for 3 doses over 15 minutes  Qty:  20 Tab  Refills:  0     * nystatin 100,000 unit/gram Powder  Commonly known as:  NYSTOP   1 g, Topical  Refills:  0     * nystatin 100,000 unit/gram Powder  Commonly known as:  NYSTOP   Apply once or twice daily to PREVENT rash under breasts  Qty:  60 g  Refills:  11     pantoprazole 40 mg Tablet, Delayed Release (E.C.)  Commonly known as:  PROTONIX   40 mg, Oral, EVERY MORNING BEFORE BREAKFAST  Qty:  30 Tab  Refills:  5     tolterodine 2 mg Tablet  Commonly known as:  DETROL   2 mg, Oral, DAILY  Refills:  0     traZODone 50 mg Tablet  Commonly known as:  DESYREL   Take 1/2 to full tablet as needed for sleep.  Qty:  30 Tab  Refills:  3         * This list has 2 medication(s) that are the same as other medications prescribed for you. Read the directions carefully, and ask your doctor or other care provider to review them with you.              Discharge med list refreshed?  YES     ALLERGIES:  Allergies   Allergen Reactions   . Cymbalta [Duloxetine]      Rash on feet and hands   . Blue Dye      Told to avoid due to patch test result by Simone Curia   . Flavoring Agent    . Lipitor [Atorvastatin] Myalgia     Muscle pain   . Nickel      Blisters     . Codeine Itching     RASH   . Mobic [Meloxicam] Nausea/ Vomiting     Nervous and shakes    . Naprosyn [Naproxen] Nausea/ Vomiting   . Nsaids (Non-Steroidal Anti-Inflammatory Drug) Nausea/ Vomiting       HOSPITAL PROCEDURE(S):   Bedside Procedures:  No orders of the defined types were placed in this encounter.    Surgical Procedure(s):  HEMITHYROIDECTOMY    REASON FOR HOSPITALIZATION AND HOSPITAL COURSE     BRIEF HPI:  This is a 63 y.o., female with thyroid nodules admitted for scheduled right hemithyroidectomy    [place]: patient with  Bilateral  thyroid nodules, larger on right who presented on 04/05/2019 for a scheduled right hemithyroidectomy with intraop neuromonitoring performed by Dr. Posey Pronto. The operation occurred without complication,  the patient was extubated post-operatively and admitted to floor. There were no acute events during hospitalization    The remainder of the patient's hospitalization was uneventful, and she was determined safe for discharge on 04/06/19.  Prior to discharge the patient was tolerating a regular diet, voiding on her own, her pain was well controlled on pain medications, and she was ambulating well and ENT felt she was safe for discharge home.  The patient will follow up with ENT in 1 weeks for reevaluation.  All questions were answered prior to discharge and the patient agreed to discharge at this time.  Instructions on diet, activity, and wound care were given to them. The patient was instructed to follow up sooner for new or concerning symptoms.      TRANSITION/POST DISCHARGE CARE/PENDING TESTS/REFERRALS: none        CONDITION ON DISCHARGE:  A. Ambulation: Full ambulation  B. Self-care Ability: Complete  C. Cognitive Status Alert and Oriented x 3  D. Code status at discharge:   Code Status Information     Code Status    Full Code              LINES/DRAINS/WOUNDS AT DISCHARGE:   Patient Lines/Drains/Airways Status    Active Line / Dialysis Catheter / Dialysis Graft / Drain / Airway / Wound     Name: Placement date: Placement time: Site: Days:    Peripheral IV Left Wrist  04/05/19   0914   1    Surgical Incision Mid;Anterior Neck  04/05/19   -   1                DISCHARGE DISPOSITION:  Home discharge      DISCHARGE INSTRUCTIONS:  Follow-up Information     [facility], [place] .    Specialty:  Otolaryngology  Contact information:  [place] [address]  [phone]  Additional information:  The [facility], also known as [place]) is located on the [facility] in [place], [place]. Below are driving directions, however, if you need additional information, please call [phone]-CARE [phone]) or you may visit our website at [email or web address].*From [place] or South:*Take exit [address]155 toward [place]. Continue on [address] Make a left onto [address] ([address]) at the [place]. At fourth traffic light, make right onto [address]. At the traffic circle, keep right, towards [facility]. *From [place] or South:*Take Exit #7, [address]. Follow signs for [place] and [place]. Continue up the hill and drive past the airport (will be on your left), bear right at the roundabout onto [address], following [address] and turn left onto [address]. Turn right at the next traffic light, [address], Continue past [facility] and follow sign to the [place].*The [facility] entrance is located on the right side after the guard booth and parking garage. Please enter the facility under the canopy directly behind the flagpoles. Valet parking is available to patients at [place] outpatient clinics free of charge and tipping is not required. Please pull under canopy to take advantage of this service.                  DISCHARGE INSTRUCTION - MISC    How do I care for the incision?    All sutures that are placed will dissolve,  unless you were told differently. Some of the sutures may poke through the skin. We can cut these in clinic or you can cut these off at the skin at home.     Showering: It is okay to shower with the following instructions:  Use non-irritating soap and pat the incision dry once you are done  Do not submerge the incision  or graft donor site under water (no baths or swimming).     Steri-Strips: It is okay if the steri-strips fall off. You can gently peel them off if they fall off  before your follow up appointment. If the steri-strips fall off, please care for your incision as outlined below:    After bathing, apply a healing cream (Aquaphor or Vitamin A&D ointment).  It is not necessary to use an antibiotic ointment unless otherwise instructed.  Reapply as needed during the day to keep the incision moist.  You do not need to keep the incision covered.       What activities can I do?    For the first week after surgery, avoid any strenuous activity such as heavy lifting (more than 10 pounds), running/jogging or bicycling.    You SHOULD be walking as much as possible.  This will help prevent potentially lethal blood clots that could develop if you do not engage in regular activity. The physical therapists may have additional instructions for you as you leave the   hospital.     What medications will I be given?    In addition to your normal medications, you will be given the following:  Pain medication: Most patients do not require narcotic pain medications after two weeks. You will be given a combination pill that contains a narcotic and Tylenol. It is important that you do not take any additional Tylenol with this medication.  Stool softner: Narcotic pain medications frequently cause constipation. You may wish to take over the counter stool softners to prevent this. Miralax is the preferred stool softer for after surgery.        What are concerning signs I should look for?    Fevers > 101.0 F  Increasing pain   Redness or swelling at the incision.   Foul drainage.   Sudden shortness of breath.   Tingling around the mouth, numbness in fingers or toes, significant muscle cramps - These are symptoms of low calcium and you should call the hospital or go to the nearest ER for evaluation.     Should any of these signs occur, call the hospital operator and ask to speak to the ENT resident on call.     SCHEDULE FOLLOW-UP - [facility] - HEAD AND NECK SURGERY     Follow-up in: [place]    Reason for visit:  POST-OP VISIT    Follow-up reason: s/p right hemithyroidectomy    Provider: Tomie China, APRN,FNP-BC    Copies sent to Care Team       Relationship Specialty Notifications Start End    Dennison Mascot, MD PCP - General GENERAL  03/09/18     Phone: [phone] Fax: [phone]         [address] [place] [address]    Leland Johns, MD PCP - Managed Care/Insurance FAMILY PRACTICE  02/12/19     Phone: [phone] Fax: [place] [phone]  Referring providers can utilize [email or web address] to access their referred [place] patient's information.

## 2019-04-06 NOTE — Care Plan (Addendum)
Alert and oriented x4. Resting comfortable in bed. Speech is hoarse due to some throat discomfort from surgery.  Assessments charted per flowsheet.Medications reviewed with patient. Medicated appropriately with PRN pain meds for neck/throat pain. VSS. Patient is on room air. Neck incision CDI, with steri strips.   Pt remains mod fall and is up with standby. Call bell within reach. Educated on call, don't fall. Will continue to monitor.

## 2019-04-06 NOTE — Nurses Notes (Signed)
Patient discharged home with family.  AVS reviewed with patient.  A written copy of the AVS and discharge instructions was given to the patient. Questions sufficiently answered as needed.  Patient/care giver encouraged to follow up with PCP as indicated.  In the event of an emergency, patient/care giver instructed to call 911 or go to the nearest emergency room. IV catheter removed intact. Patient verbalized no additional concerns/questions.     Era Skeen, RN  04/06/2019, 10:33

## 2019-04-06 NOTE — Nurses Notes (Signed)
Patient alert and oriented x4 this morning. Post op right thyroidectomy 04/05/19. VSS, respirations even and unlabored on room air. Denies N/V/D. Complains of incisional pain in neck mainly while eating or swallowing. Medicated per MAR. Updated on plan of care. Patient being discharged this morning. Ride available to patient when discharge is complete. Will continue to monitor.    Era Skeen, RN  04/06/2019, 09:29

## 2019-04-06 NOTE — Care Plan (Signed)
CCC met with pt at bedside for initial assessment and evaluation of discharge planning. Pt s/p right hemithyroidectomy for nodules. Pt alert and oriented x4. Lives alone in apartment. Denies any home accessibility problems. Independent with self care/ADLs and medication management. Reports her daughter is able to provide her transportation home on discharge. Denies any DME use. No active or previous home health services. Demographics, health insurance, pharmacy, and PCP verified. Reports does not have a MPOA, declined information when offered. Lay caregiver addressed by bedside nurse. Anticipate pt discharge home when medically stable. No care management needs identified at this time.

## 2019-04-13 ENCOUNTER — Other Ambulatory Visit: Payer: Self-pay

## 2019-04-13 ENCOUNTER — Encounter (HOSPITAL_BASED_OUTPATIENT_CLINIC_OR_DEPARTMENT_OTHER): Payer: Self-pay | Admitting: Otolaryngology

## 2019-04-13 ENCOUNTER — Ambulatory Visit (HOSPITAL_BASED_OUTPATIENT_CLINIC_OR_DEPARTMENT_OTHER): Payer: MEDICAID | Admitting: Otolaryngology

## 2019-04-13 ENCOUNTER — Encounter (INDEPENDENT_AMBULATORY_CARE_PROVIDER_SITE_OTHER): Payer: Self-pay | Admitting: Neurological Surgery

## 2019-04-13 ENCOUNTER — Ambulatory Visit
Admission: RE | Admit: 2019-04-13 | Discharge: 2019-04-13 | Disposition: A | Discharge status: Home / Self Care | Payer: MEDICAID | Source: Ambulatory Visit | Attending: Otolaryngology | Admitting: Otolaryngology

## 2019-04-13 VITALS — BP 114/82 | HR 98 | Temp 98.1°F | Ht 65.0 in | Wt 213.8 lb

## 2019-04-13 DIAGNOSIS — D34 Benign neoplasm of thyroid gland: Secondary | ICD-10-CM | POA: Insufficient documentation

## 2019-04-13 DIAGNOSIS — G8918 Other acute postprocedural pain: Secondary | ICD-10-CM | POA: Insufficient documentation

## 2019-04-13 DIAGNOSIS — E041 Nontoxic single thyroid nodule: Secondary | ICD-10-CM

## 2019-04-13 DIAGNOSIS — Z87891 Personal history of nicotine dependence: Secondary | ICD-10-CM | POA: Insufficient documentation

## 2019-04-13 DIAGNOSIS — Z483 Aftercare following surgery for neoplasm: Secondary | ICD-10-CM | POA: Insufficient documentation

## 2019-04-13 MED ORDER — AMOXICILLIN 875 MG-POTASSIUM CLAVULANATE 125 MG TABLET
1.00 | ORAL_TABLET | Freq: Two times a day (BID) | ORAL | 0 refills | Status: DC
Start: 2019-04-13 — End: 2019-05-03

## 2019-04-13 NOTE — Addendum Note (Signed)
Addended by: Delfino Lovett on: 04/13/2019 02:25 PM     Modules accepted: Orders

## 2019-04-13 NOTE — Addendum Note (Signed)
Addended by: Delfino Lovett on: 04/13/2019 11:33 AM     Modules accepted: Orders

## 2019-04-13 NOTE — Progress Notes (Addendum)
[facility], [place]  [place] [address]  [phone]    Patient Name: [name]   MRN:  [id number]   DOB: [birth year]   Date of Service:  04/13/2019     ENT HEAD AND NECK ONCOLOGY SURGERY AND RECONSTRUCTION     FOLLOW UP NOTE     HPI: This is a 63 y.o. female who presents for post-op follow-up after an uncomplicated right hemithyroidectomy. She was observed overnight after her surgery and did well. Since then she has had pain over he incision site and is having pain with coughing and eating. She is able to eat eggs and other softer foods. The pain became worse over the last day. The patient denies difficulty breathing and has not had any fevers at home.      Past Medical History:   Diagnosis Date   . Anxiety    . Arthritis    . Cancer (CMS HCC)     internal skin cancer   . Depression    . Dyspnea on exertion    . Essential hypertension 02/16/2017    patient denies   . GERD (gastroesophageal reflux disease)     controlled w/med   . Headache    . Heart murmur 1979    benign   . Hyperlipidemia LDL goal < 100 08/29/2013   . MINOR CAD (coronary artery disease) 11/10/2012    no treatment other than cholesterol medications; follows regularly with Dr. Rodney Booze   . Multinodular goiter    . Muscle weakness     neck    . Obesity    . Peripheral edema    . Rash     face only eczema   . Rheumatic fever 16    no complications   . Squamous cell carcinoma 02/06/2015    right side of neck   . Vaginal prolapse 2012    surgery improved significantly   . Wears glasses            Past Surgical History:  Past Surgical History:   Procedure Laterality Date   . COLONOSCOPY  01/06/09    COLONOSCOPY performed by Loletta Parish F at [facility]   . GASTROSCOPY  03/05/2010    GASTROSCOPY performed by Jocelyn Lamer at [facility]   . GASTROSCOPY WITH BIOPSY  03/05/2010    GASTROSCOPY WITH BIOPSY performed by Jocelyn Lamer at [facility]   . HX ADENOIDECTOMY     . HX ANKLE FRACTURE [place]  2007    left  distal fibula, casted   . HX APPENDECTOMY     . HX CHOLECYSTECTOMY     . HX COLONOSCOPY     . HX CYSTOCELE REPAIR  09/24/09   . HX HAND SURGERY  2012    for Carpal tunnel    . HX OOPHORECTOMY      left ovary removed   . HX TONSILLECTOMY     . HX TOTAL VAGINAL HYSTERECTOMY  1979   . HX WRIST FRACTURE [place]  2007    Marathon injury   . PB REVISE ULNAR NERVE AT ELBOW Left 1979   . PB UPPER GI ENDOSCOPY,BIOPSY  12/19/07    patulous GE junction zone, erythema, nonerosive GERD           Family History: Patient's family history was reviewed in writing and with the patient with findings as noted below. Unless otherwise noted, patient has no family history of  similar problems.   Family Medical History:     Problem Relation (Age of Onset)    Bipolar Disorder Daughter    Breast Cancer Paternal Aunt, Paternal Aunt, Paternal Aunt, Paternal 45, Paternal 41, Other    Cancer Paternal Aunt, Paternal 67, Paternal 35, Paternal 34, Paternal 87, Other (62)    Congestive Heart Failure Father (32)    Coronary Artery Disease Father, Mother (38)    Diabetes Mother    Heart Attack Father, Sister    High Cholesterol Mother    Hypertension (High Blood Pressure) Mother, Brother, Sister, Sister    Kidney Disease Sister    Leukemia Paternal Uncle, Paternal Uncle    Stroke Paternal Grandmother    Thyroid Disease Sister               MEDICATIONS:    Current Outpatient Medications:   .  acetaminophen (TYLENOL) 325 mg Oral Tablet, Take 2 Tabs (650 mg total) by mouth Three times a day (Patient taking differently: Take 650 mg by mouth Every 4 hours as needed ), Disp: 180 Tab, Rfl: 0  .  aspirin (ECOTRIN) 81 mg Oral Tablet, Delayed Release (E.C.), Take 81 mg by mouth Once a day, Disp: , Rfl:   .  Azelaic Acid 20 % Cream, Apply topically Twice daily (Patient taking differently: Apply topically Twice per day as needed ), Disp: 50 g, Rfl: 0  .  cholecalciferol, vitamin D3, 25 mcg (1,000 unit) Oral Tablet, Take 1 Tab (1,000 Units total) by mouth Once a  day (Patient not taking: Reported on 04/02/2019), Disp: 30 Tab, Rfl: 3  .  Clobetasol (TEMOVATE) 0.05 % Ointment, Apply topically Twice daily To rash on hands and feet (Patient taking differently: Apply topically Twice per day as needed To rash on hands and feet), Disp: 60 g, Rfl: 0  .  clobetasoL (TEMOVATE) 0.05 % Ointment, Apply topically, Disp: , Rfl:   .  diclofenac sodium (VOLTAREN) 75 mg Oral Tablet, Delayed Release (E.C.), Take 1 Tab (75 mg total) by mouth Twice daily (Patient not taking: Reported on 04/02/2019), Disp: 30 Tab, Rfl: 0  .  erythromycin (ROMYCIN) 5 mg/gram (0.5 %) Ophthalmic Ointment, Instill into right eye Twice daily, Disp: 1 g, Rfl: 0  .  FLUoxetine (SARAFEM) 20 mg Oral Tablet, Take 1 Tab (20 mg total) by mouth Twice daily, Disp: 60 Tab, Rfl: 5  .  fluticasone propionate (FLONASE) 50 mcg/actuation Nasal Spray, Suspension, 1 Spray by Each Nostril route Once a day, Disp: 16 g, Rfl: 3  .  gabapentin (NEURONTIN) 600 mg Oral Tablet, Take 1 Tab (600 mg total) by mouth Twice daily, Disp: 60 Tab, Rfl: 4  .  Halobetasol Propionate (ULTRAVATE) 0.05 % Cream, Apply  topically. Three times a week (Patient taking differently: Twice per day as needed Apply  topically. Three times a week), Disp: 15 g, Rfl: 2  .  Halobetasol Propionate (ULTRAVATE) 0.05 % Cream, Apply topically, Disp: , Rfl:   .  HYDROcodone-acetaminophen (NORCO) 5-325 mg Oral Tablet, Take 1 Tab by mouth Every 4 hours as needed, Disp: 20 Tab, Rfl: 0  .  hydrOXYzine HCL (ATARAX) 50 mg Oral Tablet, TAKE 1 TABLET BY MOUTH EVERY NIGHT, Disp: 30 Tab, Rfl: 1  .  methocarbamoL (ROBAXIN) 500 mg Oral Tablet, Take 1 Tab (500 mg total) by mouth Four times a day For muscle spasms, Disp: 60 Tab, Rfl: 0  .  Metronidazole (METROCREAM) 0.75 % Cream, Apply topically Twice daily (Patient taking differently: Apply topically  Twice per day as needed ), Disp: 45 g, Rfl: 1  .  Metronidazole (METROCREAM) 0.75 % Cream, Apply topically, Disp: , Rfl:   .  mineral  oil-hydrophil petrolat (AQUAPHOR) Ointment, Apply topically, Disp: , Rfl:   .  montelukast (SINGULAIR) 10 mg Oral Tablet, Take 1 Tab (10 mg total) by mouth Every evening (Patient not taking: Reported on 04/02/2019), Disp: 30 Tab, Rfl: 3  .  nitroGLYCERIN (NITROSTAT) 0.4 mg Sublingual Tablet, Sublingual, 1 Tab (0.4 mg total) by Sublingual route Every 5 minutes as needed for Chest pain for up to 3 doses for 3 doses over 15 minutes, Disp: 20 Tab, Rfl: 0  .  nystatin (NYSTOP) 100,000 unit/gram Powder, Apply once or twice daily to PREVENT rash under breasts, Disp: 60 g, Rfl: 11  .  nystatin (NYSTOP) 100,000 unit/gram Powder, Apply 1 g topically, Disp: , Rfl:   .  pantoprazole (PROTONIX) 40 mg Oral Tablet, Delayed Release (E.C.), Take 1 Tab (40 mg total) by mouth Every morning before breakfast, Disp: 30 Tab, Rfl: 5  .  tolterodine (DETROL) 2 mg Oral Tablet, Take 2 mg by mouth Once a day, Disp: , Rfl:   .  traZODone (DESYREL) 50 mg Oral Tablet, Take 1/2 to full tablet as needed for sleep. (Patient not taking: Reported on 02/02/2019), Disp: 30 Tab, Rfl: 3  .  triamcinolone acetonide (ARISTOCORT A) 0.1 % Ointment, by Apply Topically route Twice daily (Patient taking differently: by Apply Topically route Twice per day as needed ), Disp: 80 g, Rfl: 2  .  triamcinolone acetonide (ARISTOCORT A) 0.1 % Ointment, Apply topically, Disp: , Rfl:      ALLERGIES:  Allergy History as of 04/13/19     CODEINE       Noted Status Severity Type Reaction    12/10/15 0941 Pantojas, [place]  Active Low  Itching    Comments:  RASH     09/19/13 1430 Wallace Cullens Joy  Deleted Low  Itching    Comments:  RASH     09/04/09 Ginette Pitman  Active   Itching    Comments:  RASH     10/10/07   Active       Comments:  RASH           STRAWBERRY       Noted Status Severity Type Reaction    03/18/11 1634 Johnnette Barrios  Deleted       10/10/08 [name], MD  Deleted       10/10/07   Active             MELOXICAM       Noted Status Severity Type Reaction     11/24/18 0857 Kisner, Bishop Limbo, RN 06/26/09 Active Low  Nausea/ Vomiting    Comments:  Nervous and shakes      12/10/15 0941 Pantojas, Kristy 06/26/09 Active Low  Nausea/ Vomiting    06/26/09 Corky Sing Nacole 06/26/09 Active   Nausea/ Vomiting          NICKEL       Noted Status Severity Type Reaction    11/24/18 0856 Kisner, Bishop Limbo, RN 07/24/12 Active  Topical     Comments:  Blisters       07/24/12 1041 [name], MD 07/24/12 Active  Topical           BLUE DYE       Noted Status Severity Type Reaction    07/24/12 1041 [name], MD  07/24/12 Active  Topical     Comments:  Told to avoid due to patch test result by Simone Curia           DULOXETINE       Noted Status Severity Type Reaction    07/24/12 1052 [name], MD 07/24/12 Active Medium Systemic     Comments:  Rash on feet and hands           NAPROXEN       Noted Status Severity Type Reaction    11/01/12 0913 Sanjuana Letters, MA 11/01/12 Active Low  Nausea/ Vomiting          ATORVASTATIN       Noted Status Severity Type Reaction    06/19/18 2149 Erskine Squibb, RX STUDENT 01/27/15 Active  Side Effect Myalgia    Comments:  Muscle pain     01/27/15 1035 [name], MD 01/27/15 Active  Side Effect     Comments:  Muscle pain           FLAVORING AGENT       Noted Status Severity Type Reaction    04/03/18 1905 Suella [place], MA 05/03/17 Active             NSAIDS (NON-STEROIDAL ANTI-INFLAMMATORY DRUG)       Noted Status Severity Type Reaction    11/24/18 0857 Kisner, Bishop Limbo, RN 05/03/17 Active Low  Nausea/ Vomiting    04/03/18 1905 Suella [place], MA 05/03/17 Active                    SOCIAL HISTORY:  Social History     Socioeconomic History   . Marital status: Divorced     Spouse name: Not on file   . Number of children: Not on file   . Years of education: Not on file   . Highest education level: Not on file   Occupational History   . 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 Financial resource strain: Not on file   . Food insecurity     Worry: Not on file     Inability: Not on file   . Transportation needs     Medical: Not on file     Non-medical: Not on file   Tobacco Use   . Smoking status: Former Smoker     Packs/day: 2.00     Years: 40.00     Pack years: 80.00     Types: Cigarettes     Last attempt to quit: 01/22/2005     Years since quitting: 14.2   . Smokeless tobacco: Never Used   Substance and Sexual Activity   . Alcohol use: No     Alcohol/week: 0.0 standard drinks   . Drug use: No   . Sexual activity: Yes     Partners: Male   Lifestyle   . Physical activity     Days per week: Not on file     Minutes per session: Not on file   . Stress: Not on file   Relationships   . Social Product manager on phone: Not on file     Gets together: Not on file     Attends religious service: Not on file     Active member of club or organization: Not on file     Attends meetings of clubs or organizations: Not on  file     Relationship status: Not on file   . Intimate partner violence     Fear of current or ex partner: Not on file     Emotionally abused: Not on file     Physically abused: Not on file     Forced sexual activity: Not on file   Other Topics Concern   . Abuse/Domestic Violence Not Asked   . Breast Self Exam Not Asked   . Caffeine Concern Not Asked   . Calcium intake adequate Not Asked   . Computer Use Not Asked   . Exercise Concern Not Asked   . Helmet Use Not Asked   . Seat Belt Not Asked   . Special Diet Not Asked   . Sunscreen used Not Asked   . Uses Cane No   . Uses walker No   . Uses wheelchair No   . Right hand dominant Yes   . Left hand dominant No   . Ambidextrous No   . Ability to Walk 1 Flight of Steps without SOB/CP Yes   . Routine Exercise Yes     Comment: walk   . Ability to Walk 2 Flight of Steps without SOB/CP No     Comment: shortness of breath, no cp    . Unable to Ambulate Not Asked   . Total Care Not Asked   . Ability To  Do Own ADL's Yes   . Uses Walker Not Asked   . Other Activity Level Not Asked   . Uses Cane Not Asked   . Drives Not Asked   . Shift Work No   . Unusual Sleep-Wake Schedule No   Social History Narrative   . Not on file         PHYSICAL EXAM:  BP 114/82   Pulse 98   Temp 36.7 C (98.1 F) (Thermal Scan)   Ht 1.651 m (5\' 5" )   Wt 97 kg (213 lb 13.5 oz)   BMI 35.59 kg/m       Body mass index is 35.59 kg/m.  General Appearance: Pleasant, cooperative, healthy, and in no acute distress.  Eyes: Conjunctivae/corneas clear, EOM's intact.  Head and Face: Normocephalic, atraumatic.  Face symmetric, no obvious lesions.   Ears: Normal shape and position.   Nose:  External pyramid midline. Septum midline.   Oral Cavity/Oropharynx: No mucosal lesions, masses, or pharyngeal asymmetry.  Hypopharynx/Larynx: See scope exam.   Neck: Incision flat. Some mild erythema of the skin. No fluctuance. Incision is intact without separation of dehiscence when probed. Some firmness inferior to incision.   Cardiovascular:  Good perfusion of upper extremities.  No cyanosis of the hands or fingers.  Lungs: No apparent stridorous breathing. No acute distress.  Neurologic: Cranial nerves: grossly intact.  Psychiatric:  Alert and oriented x 3.          Procedure: Flexible laryngoscopy  Pre-op Dx: Pain s/p hemithyroidectomy      The bilateral nasal cavities were anesthetized and decongested. A flexible scope was inserted and carried back to the nasopharynx. The nasal cavities, nasopharynx and hypopharynx were evaluated. The base of tongue and supraglottis were examined. The vocal cords and pyriform sinuses were examined for appearance and motion. Significant findings are as noted: No laryngeal edema. VF mobile. Mucosa pink.         PATHOLOGY:   Recent Results (from the past 720 hour(s))   INITIAL SURGICAL PATHOLOGY SPECIMENS    Narrative  [facility]       [place], [place]  [address]       Phone: [phone]                   Fax: [phone]              Surgical Pathology Report             Patient Name: BRENTNEY, GOLDBACH. Accession #: [id number] Med. Rec. #: [id number]  Client: [place]: 04/05/2019 DOB: [birth year] (Age: 18) Location: 5NPP  Received: 04/05/2019 Gender:  F Service: Otolaryngology Reported: 04/06/2019 12:08    Billing #: [id number]   Physician(s):  Delfino Lovett, M.D.    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          Comment  There is a solitary nodule with a thin fibrous capsule favoring adenoma over  hyperplastic nodule which is also a consideration.  No capsular or vascular  invasion is seen (entire nodule submitted).              hjw - 04/06/2019 Electronically Signed By: Viviana Simpler, MD  04/06/2019 12:08           Microscopic  Performed.          Clinical History  Pre-Op Diagnosis:  Right thyroid nodule.   OR Room and Phone #: OR [phone]   Surgical Procedure:  Right hemithyroidectomy.   OR Location->RU[facility] 5N              Gross Description  Part A is received in formalin labeled "Suella, Cogar" and "right  hemithyroidectomy."  It consists of a 7.6 g right hemithyroidectomy specimen  with a stitch indicating the superior pole.  The specimen measures 5.2 cm from  superior to inferior, 2.5 cm from medial to lateral and 1.0 cm from anterior to  posterior.  There is a 1.0 x 0.4 x 0.3 cm attached portion of isthmus.  The  overlying capsule is intact except 1.0 cm defect on the posterior capsule  consistent with iatrogenic injury. The specimen is differentially inked  (anterior-blue, posterior-black, isthmus-orange) and serially sectioned from  superior to inferior to reveal a 1.2 x 1.1 x 0.6 cm tan, ovoid, encapsulated  mass involving the lower pole. The mass abuts but does not involve the  anterior  capsule, comes to within 0.1 cm of the posterior capsule and is 0.8 cm from the  isthmus.  The uninvolved parenchyma is dark red-brown and beefy.  No other  lesions are identified grossly. Representative sections are submitted as  follows:   A1:  Upper pole, two full-thickness sections  A2:  Midpole two full-thickness sections  A3-A5:  Entire mass, full-thickness section  A6:  Lower pole, two full-thickness sections, to include nearest isthmus.               tjwpa - 04/05/2019  Conchita Paris, [place] (ASCP)           ASSESSMENT/PLAN: Patient s/p right hemithyroidectomy with new and increased anterior neck pain since after surgery about a week ago. There is no obvious fluctuance on exam and scope exam WNL. We obtained imaging today to rule out underlying process. The scan was reviewed by myself with our reading neuroradiologist. They discussed expected post-operative changes without obvious fluid collection. I discussed with patient; will plan on antibiotics in case there is a cellulitis and very close follow-up in 3 days. The patient knows that she should start feeling better over the next day but if she doesn't to call or come in to the ED. Pictures taken for the chart. I also called the patient's daughter and went over our instructions as well, and was clear that with any sign of worsening symptoms the patient should come into the ED over the weekend.     Delfino Lovett, MD

## 2019-04-16 ENCOUNTER — Encounter (HOSPITAL_BASED_OUTPATIENT_CLINIC_OR_DEPARTMENT_OTHER): Payer: Self-pay | Admitting: Physician Assistant

## 2019-04-16 ENCOUNTER — Ambulatory Visit (HOSPITAL_BASED_OUTPATIENT_CLINIC_OR_DEPARTMENT_OTHER): Payer: Self-pay | Admitting: Family Medicine

## 2019-04-16 ENCOUNTER — Other Ambulatory Visit: Payer: Self-pay

## 2019-04-16 ENCOUNTER — Ambulatory Visit: Payer: MEDICAID | Attending: Otolaryngology | Admitting: Physician Assistant

## 2019-04-16 VITALS — BP 137/90 | HR 82 | Temp 98.0°F | Resp 18 | Ht 65.0 in | Wt 216.1 lb

## 2019-04-16 DIAGNOSIS — Z86018 Personal history of other benign neoplasm: Secondary | ICD-10-CM | POA: Insufficient documentation

## 2019-04-16 DIAGNOSIS — Z09 Encounter for follow-up examination after completed treatment for conditions other than malignant neoplasm: Secondary | ICD-10-CM | POA: Insufficient documentation

## 2019-04-16 DIAGNOSIS — Z1211 Encounter for screening for malignant neoplasm of colon: Secondary | ICD-10-CM

## 2019-04-16 DIAGNOSIS — Z87891 Personal history of nicotine dependence: Secondary | ICD-10-CM | POA: Insufficient documentation

## 2019-04-16 DIAGNOSIS — Z85828 Personal history of other malignant neoplasm of skin: Secondary | ICD-10-CM | POA: Insufficient documentation

## 2019-04-16 DIAGNOSIS — Z9889 Other specified postprocedural states: Secondary | ICD-10-CM | POA: Insufficient documentation

## 2019-04-16 DIAGNOSIS — E890 Postprocedural hypothyroidism: Secondary | ICD-10-CM

## 2019-04-16 DIAGNOSIS — Z79899 Other long term (current) drug therapy: Secondary | ICD-10-CM | POA: Insufficient documentation

## 2019-04-16 DIAGNOSIS — Z9009 Acquired absence of other part of head and neck: Secondary | ICD-10-CM

## 2019-04-16 NOTE — Progress Notes (Signed)
[facility], [place]  [place] [address]  [phone]    Patient Name: [name]   MRN:  [id number]   DOB: Mar 03, 1956   Date of Service:  04/16/2019     ENT HEAD AND NECK ONCOLOGY SURGERY AND RECONSTRUCTION     FOLLOW UP NOTE     HPI: This is a 63 y.o. 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  She was seen for post-op visit 04/13/19.  At that time she reported an acute increase in pain over the incision site, and pain with coughing and eating.  CT neck 04/13/19 was reviewed with our reading neuroradiologist, demonstrating expected post-operative changes without obvious fluid collection.  She was prescribed Augmentin x 10 days for potential cellulitis.  Patient states her pain has improved greatly since her last visit.  She has stopped taking her narcotic pain medication and is now taking OTC ibuprofen.  She denies purulent drainage or fevers.     Past Medical History:   Diagnosis Date   . Anxiety    . Arthritis    . Cancer (CMS HCC)     internal skin cancer   . Depression    . Dyspnea on exertion    . Essential hypertension 02/16/2017    patient denies   . GERD (gastroesophageal reflux disease)     controlled w/med   . Headache    . Heart murmur 1979    benign   . Hyperlipidemia LDL goal < 100 08/29/2013   . MINOR CAD (coronary artery disease) 11/10/2012    no treatment other than cholesterol medications; follows regularly with Dr. Rodney Booze   . Multinodular goiter    . Muscle weakness     neck    . Obesity    . Peripheral edema    . Rash     face only eczema   . Rheumatic fever 16    no complications   . Squamous cell carcinoma 02/06/2015    right side of neck   . Vaginal prolapse 2012    surgery improved significantly   . Wears glasses      Past Surgical History:  Past Surgical History:   Procedure Laterality Date   . COLONOSCOPY  01/06/09    COLONOSCOPY performed by Loletta Parish F at [place]   .  GASTROSCOPY  03/05/2010    GASTROSCOPY performed by Jocelyn Lamer at [facility]   . GASTROSCOPY WITH BIOPSY  03/05/2010    GASTROSCOPY WITH BIOPSY performed by Jocelyn Lamer at [facility]   . HX ADENOIDECTOMY     . HX ANKLE FRACTURE [place]  2007    left distal fibula, casted   . HX APPENDECTOMY     . HX CHOLECYSTECTOMY     . HX COLONOSCOPY     . HX CYSTOCELE REPAIR  09/24/09   . HX HAND SURGERY  2012    for Carpal tunnel    . HX OOPHORECTOMY      left ovary removed   . HX TONSILLECTOMY     . HX TOTAL VAGINAL HYSTERECTOMY  1979   . HX WRIST FRACTURE [place]  2007    [place] injury   . PB REVISE ULNAR NERVE AT ELBOW Left 1979   . PB UPPER GI ENDOSCOPY,BIOPSY  12/19/07    patulous GE junction zone, erythema, nonerosive GERD     Family History: Patient's family history was reviewed  in writing and with the patient with findings as noted below. Unless otherwise noted, patient has no family history of similar problems.   Family Medical History:     Problem Relation (Age of Onset)    Bipolar Disorder Daughter    Breast Cancer Paternal Aunt, Paternal Aunt, Paternal Aunt, Paternal 26, Paternal 29, Other    Cancer Paternal 84, Paternal 29, Paternal 29, Paternal 61, Paternal 26, Other (22)    Congestive Heart Failure Father (58)    Coronary Artery Disease Father, Mother (73)    Diabetes Mother    Heart Attack Father, Sister    High Cholesterol Mother    Hypertension (High Blood Pressure) Mother, Brother, Sister, Sister    Kidney Disease Sister    Leukemia Paternal Uncle, Paternal Uncle    Stroke Paternal Grandmother    Thyroid Disease Sister        MEDICATIONS:    Current Outpatient Medications:   .  acetaminophen (TYLENOL) 325 mg Oral Tablet, Take 2 Tabs (650 mg total) by mouth Three times a day (Patient taking differently: Take 650 mg by mouth Every 4 hours as needed ), Disp: 180 Tab, Rfl: 0  .  amoxicillin-pot clavulanate (AUGMENTIN) 875-125 mg Oral Tablet, Take 1 Tab by mouth Every 12 hours, Disp: 20 Tab, Rfl: 0  .  aspirin  (ECOTRIN) 81 mg Oral Tablet, Delayed Release (E.C.), Take 81 mg by mouth Once a day, Disp: , Rfl:   .  Azelaic Acid 20 % Cream, Apply topically Twice daily (Patient taking differently: Apply topically Twice per day as needed ), Disp: 50 g, Rfl: 0  .  cholecalciferol, vitamin D3, 25 mcg (1,000 unit) Oral Tablet, Take 1 Tab (1,000 Units total) by mouth Once a day (Patient not taking: Reported on 04/02/2019), Disp: 30 Tab, Rfl: 3  .  Clobetasol (TEMOVATE) 0.05 % Ointment, Apply topically Twice daily To rash on hands and feet (Patient taking differently: Apply topically Twice per day as needed To rash on hands and feet), Disp: 60 g, Rfl: 0  .  clobetasoL (TEMOVATE) 0.05 % Ointment, Apply topically, Disp: , Rfl:   .  diclofenac sodium (VOLTAREN) 75 mg Oral Tablet, Delayed Release (E.C.), Take 1 Tab (75 mg total) by mouth Twice daily (Patient not taking: Reported on 04/02/2019), Disp: 30 Tab, Rfl: 0  .  erythromycin (ROMYCIN) 5 mg/gram (0.5 %) Ophthalmic Ointment, Instill into right eye Twice daily, Disp: 1 g, Rfl: 0  .  FLUoxetine (SARAFEM) 20 mg Oral Tablet, Take 1 Tab (20 mg total) by mouth Twice daily, Disp: 60 Tab, Rfl: 5  .  fluticasone propionate (FLONASE) 50 mcg/actuation Nasal Spray, Suspension, 1 Spray by Each Nostril route Once a day, Disp: 16 g, Rfl: 3  .  gabapentin (NEURONTIN) 600 mg Oral Tablet, Take 1 Tab (600 mg total) by mouth Twice daily, Disp: 60 Tab, Rfl: 4  .  Halobetasol Propionate (ULTRAVATE) 0.05 % Cream, Apply  topically. Three times a week (Patient taking differently: Twice per day as needed Apply  topically. Three times a week), Disp: 15 g, Rfl: 2  .  Halobetasol Propionate (ULTRAVATE) 0.05 % Cream, Apply topically, Disp: , Rfl:   .  HYDROcodone-acetaminophen (NORCO) 5-325 mg Oral Tablet, Take 1 Tab by mouth Every 4 hours as needed, Disp: 20 Tab, Rfl: 0  .  hydrOXYzine HCL (ATARAX) 50 mg Oral Tablet, TAKE 1 TABLET BY MOUTH EVERY NIGHT, Disp: 30 Tab, Rfl: 1  .  methocarbamoL (ROBAXIN) 500 mg Oral  Tablet,  Take 1 Tab (500 mg total) by mouth Four times a day For muscle spasms, Disp: 60 Tab, Rfl: 0  .  Metronidazole (METROCREAM) 0.75 % Cream, Apply topically Twice daily (Patient taking differently: Apply topically Twice per day as needed ), Disp: 45 g, Rfl: 1  .  Metronidazole (METROCREAM) 0.75 % Cream, Apply topically, Disp: , Rfl:   .  mineral oil-hydrophil petrolat (AQUAPHOR) Ointment, Apply topically, Disp: , Rfl:   .  montelukast (SINGULAIR) 10 mg Oral Tablet, Take 1 Tab (10 mg total) by mouth Every evening (Patient not taking: Reported on 04/02/2019), Disp: 30 Tab, Rfl: 3  .  nitroGLYCERIN (NITROSTAT) 0.4 mg Sublingual Tablet, Sublingual, 1 Tab (0.4 mg total) by Sublingual route Every 5 minutes as needed for Chest pain for up to 3 doses for 3 doses over 15 minutes, Disp: 20 Tab, Rfl: 0  .  nystatin (NYSTOP) 100,000 unit/gram Powder, Apply once or twice daily to PREVENT rash under breasts, Disp: 60 g, Rfl: 11  .  nystatin (NYSTOP) 100,000 unit/gram Powder, Apply 1 g topically, Disp: , Rfl:   .  pantoprazole (PROTONIX) 40 mg Oral Tablet, Delayed Release (E.C.), Take 1 Tab (40 mg total) by mouth Every morning before breakfast, Disp: 30 Tab, Rfl: 5  .  tolterodine (DETROL) 2 mg Oral Tablet, Take 2 mg by mouth Once a day, Disp: , Rfl:   .  traZODone (DESYREL) 50 mg Oral Tablet, Take 1/2 to full tablet as needed for sleep. (Patient not taking: Reported on 02/02/2019), Disp: 30 Tab, Rfl: 3  .  triamcinolone acetonide (ARISTOCORT A) 0.1 % Ointment, by Apply Topically route Twice daily (Patient taking differently: by Apply Topically route Twice per day as needed ), Disp: 80 g, Rfl: 2  .  triamcinolone acetonide (ARISTOCORT A) 0.1 % Ointment, Apply topically, Disp: , Rfl:      ALLERGIES:  Allergy History as of 04/16/19     CODEINE       Noted Status Severity Type Reaction    12/10/15 0941 Pantojas, [place]  Active Low  Itching    Comments:  RASH     09/19/13 1430 Wallace Cullens Joy  Deleted Low  Itching    Comments:   RASH     09/04/09 Ginette Pitman  Active   Itching    Comments:  RASH     10/10/07   Active       Comments:  RASH           STRAWBERRY       Noted Status Severity Type Reaction    03/18/11 1634 Johnnette Barrios  Deleted       10/10/08 [name], MD  Deleted       10/10/07   Active             MELOXICAM       Noted Status Severity Type Reaction    11/24/18 0857 Kisner, [place] Limbo, RN 06/26/09 Active Low  Nausea/ Vomiting    Comments:  Nervous and shakes      12/10/15 0941 Pantojas, Kristy 06/26/09 Active Low  Nausea/ Vomiting    06/26/09 Corky Sing [place] 06/26/09 Active   Nausea/ Vomiting          NICKEL       Noted Status Severity Type Reaction    11/24/18 0856 Kisner, [place] Limbo, RN 07/24/12 Active  Topical     Comments:  Blisters       07/24/12 1041 [name], MD  07/24/12 Active  Topical           BLUE DYE       Noted Status Severity Type Reaction    07/24/12 1041 [name], MD 07/24/12 Active  Topical     Comments:  Told to avoid due to patch test result by Derm            DULOXETINE       Noted Status Severity Type Reaction    07/24/12 1052 [name], MD 07/24/12 Active Medium Systemic     Comments:  Rash on feet and hands           NAPROXEN       Noted Status Severity Type Reaction    11/01/12 0913 Sanjuana Letters, MA 11/01/12 Active Low  Nausea/ Vomiting          ATORVASTATIN       Noted Status Severity Type Reaction    06/19/18 2149 Erskine Squibb, RX STUDENT 01/27/15 Active  Side Effect Myalgia    Comments:  Muscle pain     01/27/15 1035 [name], MD 01/27/15 Active  Side Effect     Comments:  Muscle pain           FLAVORING AGENT       Noted Status Severity Type Reaction    04/03/18 1905 Suella [place], MA 05/03/17 Active             NSAIDS (NON-STEROIDAL ANTI-INFLAMMATORY DRUG)       Noted Status Severity Type Reaction    11/24/18 0857 Kisner, Bishop Limbo, RN 05/03/17 Active Low  Nausea/ Vomiting    04/03/18 1905 Suella [place], MA  05/03/17 Active                  SOCIAL HISTORY:  Social History     Socioeconomic History   . Marital status: Divorced     Spouse name: Not on file   . Number of children: Not on file   . Years of education: Not on file   . Highest education level: Not on file   Occupational History   . 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 Financial resource strain: Not on file   . Food insecurity     Worry: Not on file     Inability: Not on file   . Transportation needs     Medical: Not on file     Non-medical: Not on file   Tobacco Use   . Smoking status: Former Smoker     Packs/day: 2.00     Years: 40.00     Pack years: 80.00     Types: Cigarettes     Last attempt to quit: 01/22/2005     Years since quitting: 14.2   . Smokeless tobacco: Never Used   Substance and Sexual Activity   . Alcohol use: No     Alcohol/week: 0.0 standard drinks   . Drug use: No   . Sexual activity: Yes     Partners: Male   Lifestyle   . Physical activity     Days per week: Not on file     Minutes per session: Not on file   . Stress: Not on file   Relationships   . Social Product manager on phone: Not on file     Gets together: Not on file  Attends religious service: Not on file     Active member of club or organization: Not on file     Attends meetings of clubs or organizations: Not on file     Relationship status: Not on file   . Intimate partner violence     Fear of current or ex partner: Not on file     Emotionally abused: Not on file     Physically abused: Not on file     Forced sexual activity: Not on file   Other Topics Concern   . Abuse/Domestic Violence Not Asked   . Breast Self Exam Not Asked   . Caffeine Concern Not Asked   . Calcium intake adequate Not Asked   . Computer Use Not Asked   . Exercise Concern Not Asked   . Helmet Use Not Asked   . Seat Belt Not Asked   . Special Diet Not Asked   . Sunscreen used Not Asked   . Uses Cane No   . Uses walker No   . Uses  wheelchair No   . Right hand dominant Yes   . Left hand dominant No   . Ambidextrous No   . Ability to Walk 1 Flight of Steps without SOB/CP Yes   . Routine Exercise Yes     Comment: walk   . Ability to Walk 2 Flight of Steps without SOB/CP No     Comment: shortness of breath, no cp    . Unable to Ambulate Not Asked   . Total Care Not Asked   . Ability To Do Own ADL's Yes   . Uses Walker Not Asked   . Other Activity Level Not Asked   . Uses Cane Not Asked   . Drives Not Asked   . Shift Work No   . Unusual Sleep-Wake Schedule No   Social History Narrative   . Not on file     PHYSICAL EXAM:  BP 137/90   Pulse 82   Temp 36.7 C (98 F) (Thermal Scan)   Resp 18   Ht 1.651 m (5\' 5" )   Wt 98 kg (216 lb 0.8 oz)   BMI 35.95 kg/m       Body mass index is 35.95 kg/m.  General Appearance: Pleasant, cooperative, healthy, and in no acute distress.  Eyes: Conjunctivae/corneas clear, EOM's intact.  Head and Face: Normocephalic, atraumatic.  Face symmetric, no obvious lesions.   Ears: Normal shape and position.   Nose:  External pyramid midline. Septum midline.   Oral Cavity/Oropharynx: No mucosal lesions, masses, or pharyngeal asymmetry.  Hypopharynx/Larynx: Voice normal.  Neck: Anterior neck incision c/d/i with small area of infraincisional edema at the left lateral edge that is non-fluctuant to palpation.  No purulence or drainage expressed with palpation.  Previously noted erythema improved.  Cardiovascular:  Good perfusion of upper extremities.  No cyanosis of the hands or fingers.  Lungs: No apparent stridorous breathing. No acute distress.  Neurologic: Cranial nerves: grossly intact.  Psychiatric:  Alert and oriented x 3.            PATHOLOGY:   Recent Results (from the past 720 hour(s))   [facility]       [place],  [address]       Phone: [phone]  Fax: [phone]               Surgical Pathology Report             Patient Name: [name], [name]. Accession #: [id number] Med. Rec. #: [id number]  Client: [place] : 04/05/2019 DOB: 12-16-1955 (Age: 52) Location: [facility]  Received: 04/05/2019 Gender:  F Service: Otolaryngology Reported: 04/06/2019 12:08    Billing #: [id number]   Physician(s):  Delfino Lovett, M.D.    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          Comment  There is a solitary nodule with a thin fibrous capsule favoring adenoma over  hyperplastic nodule which is also a consideration.  No capsular or vascular  invasion is seen (entire nodule submitted).              hjw - 04/06/2019 Electronically Signed By: Viviana Simpler, MD  04/06/2019 12:08           Microscopic  Performed.          Clinical History  Pre-Op Diagnosis:  Right thyroid nodule.   OR Room and Phone #: OR [phone] [phone]   Surgical Procedure:  Right hemithyroidectomy.   OR Location->[facility]              Gross Description  Part A is received in formalin labeled "[name], [name]" and "right  hemithyroidectomy."  It consists of a 7.6 g right hemithyroidectomy specimen  with a stitch indicating the superior pole.  The specimen measures 5.2 cm from  superior to inferior, 2.5 cm from medial to lateral and 1.0 cm from anterior to  posterior.  There is a 1.0 x 0.4 x 0.3 cm attached portion of isthmus.  The  overlying capsule is intact except 1.0 cm defect on the posterior capsule  consistent with iatrogenic injury. The specimen is differentially inked  (anterior-blue, posterior-black, isthmus-orange) and serially sectioned from  superior to inferior to reveal a 1.2 x 1.1 x 0.6 cm tan, ovoid, encapsulated  mass involving the lower pole. The mass abuts but does not involve the anterior  capsule, comes to within 0.1 cm of the posterior capsule and is 0.8 cm from  the  isthmus.  The uninvolved parenchyma is dark red-brown and beefy.  No other  lesions are identified grossly. Representative sections are submitted as  follows:   A1:  Upper pole, two full-thickness sections  A2:  Midpole two full-thickness sections  A3-A5:  Entire mass, full-thickness section  A6:  Lower pole, two full-thickness sections, to include nearest isthmus.               tjwpa - 04/05/2019  Larae Grooms  [place], [place] (ASCP)           ASSESSMENT/PLAN:   This is a 63 y.o. female who presents with increased post-op pain, now resolved, s/p right hemithyroidectomy 2/37/02 for follicular adenoma.  Patient much improved on exam today.  Will have her continue antibiotics as prescribed.  Plan to see her back in 1 week or sooner if new concerns arise.    The patient was seen independently.    Logan Bores, PA-C  04/16/2019, 13:06

## 2019-04-16 NOTE — Telephone Encounter (Signed)
Regarding: Colonoscopy  ----- Message from Julian Reil sent at 04/16/2019  7:33 AM EDT -----  Dennison Mascot, MD    Per pt's MyChart message she is requesting an order for a colonoscopy. Please advise.

## 2019-04-23 ENCOUNTER — Ambulatory Visit: Payer: MEDICAID | Attending: Physician Assistant | Admitting: Otolaryngology

## 2019-04-23 ENCOUNTER — Encounter (HOSPITAL_BASED_OUTPATIENT_CLINIC_OR_DEPARTMENT_OTHER): Payer: Self-pay | Admitting: Otolaryngology

## 2019-04-23 ENCOUNTER — Other Ambulatory Visit: Payer: Self-pay

## 2019-04-23 VITALS — BP 130/82 | HR 113 | Temp 98.2°F | Resp 18 | Ht 65.0 in | Wt 217.4 lb

## 2019-04-23 DIAGNOSIS — Z79899 Other long term (current) drug therapy: Secondary | ICD-10-CM | POA: Insufficient documentation

## 2019-04-23 DIAGNOSIS — E890 Postprocedural hypothyroidism: Secondary | ICD-10-CM

## 2019-04-23 DIAGNOSIS — Z87891 Personal history of nicotine dependence: Secondary | ICD-10-CM | POA: Insufficient documentation

## 2019-04-23 DIAGNOSIS — D34 Benign neoplasm of thyroid gland: Secondary | ICD-10-CM

## 2019-04-23 DIAGNOSIS — Z9889 Other specified postprocedural states: Secondary | ICD-10-CM

## 2019-04-23 DIAGNOSIS — Z09 Encounter for follow-up examination after completed treatment for conditions other than malignant neoplasm: Secondary | ICD-10-CM | POA: Insufficient documentation

## 2019-04-23 LAB — THYROID STIMULATING HORMONE (SENSITIVE TSH): TSH: 2.067 u[IU]/mL (ref 0.350–5.000)

## 2019-04-23 NOTE — Progress Notes (Signed)
[facility], [place]  [place] [address]  [phone]    Patient Name: [name]   MRN:  [id number]   DOB: July 05, 1956   Date of Service:  04/23/2019     ENT HEAD AND NECK ONCOLOGY SURGERY AND RECONSTRUCTION     FOLLOW UP NOTE     HPI: This is a 63 y.o. 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  She was seen for post-op visit 04/13/19, at which time she reported an acute increase in pain over the incision site, and pain with coughing and eating.  CT neck 04/13/19 was reviewed with our reading neuroradiologist, demonstrating expected post-operative changes without obvious fluid collection.  She was prescribed Augmentin x 10 days for potential cellulitis.  The patient states her pain has completely resolved.  She denies fever, purulent drainage, change in voice, difficulty swallowing, and dyspnea.    Past Medical History:   Diagnosis Date   . Anxiety    . Arthritis    . Cancer (CMS HCC)     internal skin cancer   . Depression    . Dyspnea on exertion    . Essential hypertension 02/16/2017    patient denies   . GERD (gastroesophageal reflux disease)     controlled w/med   . Headache    . Heart murmur 1979    benign   . Hyperlipidemia LDL goal < 100 08/29/2013   . MINOR CAD (coronary artery disease) 11/10/2012    no treatment other than cholesterol medications; follows regularly with Dr. Rodney Booze   . Multinodular goiter    . Muscle weakness     neck    . Obesity    . Peripheral edema    . Rash     face only eczema   . Rheumatic fever 16    no complications   . Squamous cell carcinoma 02/06/2015    right side of neck   . Vaginal prolapse 2012    surgery improved significantly   . Wears glasses      Past Surgical History:  Past Surgical History:   Procedure Laterality Date   . COLONOSCOPY  01/06/09    COLONOSCOPY performed by Loletta Parish F at [facility]   . GASTROSCOPY  03/05/2010    GASTROSCOPY performed by Jocelyn Lamer at [facility]   . GASTROSCOPY WITH BIOPSY  03/05/2010    GASTROSCOPY WITH BIOPSY performed by Jocelyn Lamer at [facility]   . HX ADENOIDECTOMY     . HX ANKLE FRACTURE [place]  2007    left distal fibula, casted   . HX APPENDECTOMY     . HX CHOLECYSTECTOMY     . HX COLONOSCOPY     . HX CYSTOCELE REPAIR  09/24/09   . HX HAND SURGERY  2012    for Carpal tunnel    . HX OOPHORECTOMY      left ovary removed   . HX TONSILLECTOMY     . HX TOTAL VAGINAL HYSTERECTOMY  1979   . HX WRIST FRACTURE [place]  2007    Garibaldi injury   . PB REVISE ULNAR NERVE AT ELBOW Left 1979   . PB UPPER GI ENDOSCOPY,BIOPSY  12/19/07    patulous GE junction zone, erythema, nonerosive GERD     Family History: Patient's family history was reviewed in writing and with the patient with findings as noted below. Unless otherwise noted,  patient has no family history of similar problems.   Family Medical History:     Problem Relation (Age of Onset)    Bipolar Disorder Daughter    Breast Cancer Paternal Aunt, Paternal Aunt, Paternal Aunt, Paternal 10, Paternal 83, Other    Cancer Paternal 30, Paternal 75, Paternal 65, Paternal 71, Paternal 83, Other (37)    Congestive Heart Failure Father (90)    Coronary Artery Disease Father, Mother (5)    Diabetes Mother    Heart Attack Father, Sister    High Cholesterol Mother    Hypertension (High Blood Pressure) Mother, Brother, Sister, Sister    Kidney Disease Sister    Leukemia Paternal Uncle, Paternal Uncle    Stroke Paternal Grandmother    Thyroid Disease Sister        MEDICATIONS:    Current Outpatient Medications:   .  acetaminophen (TYLENOL) 325 mg Oral Tablet, Take 2 Tabs (650 mg total) by mouth Three times a day (Patient taking differently: Take 650 mg by mouth Every 4 hours as needed ), Disp: 180 Tab, Rfl: 0  .  amoxicillin-pot clavulanate (AUGMENTIN) 875-125 mg Oral Tablet, Take 1 Tab by mouth Every 12 hours, Disp: 20 Tab, Rfl: 0  .  aspirin (ECOTRIN) 81 mg Oral Tablet, Delayed Release (E.C.), Take  81 mg by mouth Once a day, Disp: , Rfl:   .  Azelaic Acid 20 % Cream, Apply topically Twice daily (Patient taking differently: Apply topically Twice per day as needed ), Disp: 50 g, Rfl: 0  .  cholecalciferol, vitamin D3, 25 mcg (1,000 unit) Oral Tablet, Take 1 Tab (1,000 Units total) by mouth Once a day (Patient not taking: Reported on 04/02/2019), Disp: 30 Tab, Rfl: 3  .  Clobetasol (TEMOVATE) 0.05 % Ointment, Apply topically Twice daily To rash on hands and feet (Patient taking differently: Apply topically Twice per day as needed To rash on hands and feet), Disp: 60 g, Rfl: 0  .  clobetasoL (TEMOVATE) 0.05 % Ointment, Apply topically, Disp: , Rfl:   .  diclofenac sodium (VOLTAREN) 75 mg Oral Tablet, Delayed Release (E.C.), Take 1 Tab (75 mg total) by mouth Twice daily (Patient not taking: Reported on 04/02/2019), Disp: 30 Tab, Rfl: 0  .  erythromycin (ROMYCIN) 5 mg/gram (0.5 %) Ophthalmic Ointment, Instill into right eye Twice daily, Disp: 1 g, Rfl: 0  .  FLUoxetine (SARAFEM) 20 mg Oral Tablet, Take 1 Tab (20 mg total) by mouth Twice daily, Disp: 60 Tab, Rfl: 5  .  fluticasone propionate (FLONASE) 50 mcg/actuation Nasal Spray, Suspension, 1 Spray by Each Nostril route Once a day, Disp: 16 g, Rfl: 3  .  gabapentin (NEURONTIN) 600 mg Oral Tablet, Take 1 Tab (600 mg total) by mouth Twice daily, Disp: 60 Tab, Rfl: 4  .  Halobetasol Propionate (ULTRAVATE) 0.05 % Cream, Apply  topically. Three times a week (Patient taking differently: Twice per day as needed Apply  topically. Three times a week), Disp: 15 g, Rfl: 2  .  Halobetasol Propionate (ULTRAVATE) 0.05 % Cream, Apply topically, Disp: , Rfl:   .  HYDROcodone-acetaminophen (NORCO) 5-325 mg Oral Tablet, Take 1 Tab by mouth Every 4 hours as needed, Disp: 20 Tab, Rfl: 0  .  hydrOXYzine HCL (ATARAX) 50 mg Oral Tablet, TAKE 1 TABLET BY MOUTH EVERY NIGHT, Disp: 30 Tab, Rfl: 1  .  methocarbamoL (ROBAXIN) 500 mg Oral Tablet, Take 1 Tab (500 mg total) by mouth Four times a day  For muscle  spasms, Disp: 60 Tab, Rfl: 0  .  Metronidazole (METROCREAM) 0.75 % Cream, Apply topically Twice daily (Patient taking differently: Apply topically Twice per day as needed ), Disp: 45 g, Rfl: 1  .  Metronidazole (METROCREAM) 0.75 % Cream, Apply topically, Disp: , Rfl:   .  mineral oil-hydrophil petrolat (AQUAPHOR) Ointment, Apply topically, Disp: , Rfl:   .  montelukast (SINGULAIR) 10 mg Oral Tablet, Take 1 Tab (10 mg total) by mouth Every evening (Patient not taking: Reported on 04/02/2019), Disp: 30 Tab, Rfl: 3  .  nitroGLYCERIN (NITROSTAT) 0.4 mg Sublingual Tablet, Sublingual, 1 Tab (0.4 mg total) by Sublingual route Every 5 minutes as needed for Chest pain for up to 3 doses for 3 doses over 15 minutes, Disp: 20 Tab, Rfl: 0  .  nystatin (NYSTOP) 100,000 unit/gram Powder, Apply once or twice daily to PREVENT rash under breasts, Disp: 60 g, Rfl: 11  .  nystatin (NYSTOP) 100,000 unit/gram Powder, Apply 1 g topically, Disp: , Rfl:   .  pantoprazole (PROTONIX) 40 mg Oral Tablet, Delayed Release (E.C.), Take 1 Tab (40 mg total) by mouth Every morning before breakfast, Disp: 30 Tab, Rfl: 5  .  tolterodine (DETROL) 2 mg Oral Tablet, Take 2 mg by mouth Once a day, Disp: , Rfl:   .  traZODone (DESYREL) 50 mg Oral Tablet, Take 1/2 to full tablet as needed for sleep. (Patient not taking: Reported on 02/02/2019), Disp: 30 Tab, Rfl: 3  .  triamcinolone acetonide (ARISTOCORT A) 0.1 % Ointment, by Apply Topically route Twice daily (Patient taking differently: by Apply Topically route Twice per day as needed ), Disp: 80 g, Rfl: 2  .  triamcinolone acetonide (ARISTOCORT A) 0.1 % Ointment, Apply topically, Disp: , Rfl:      ALLERGIES:  Allergy History as of 04/23/19     CODEINE       Noted Status Severity Type Reaction    12/10/15 0941 Pantojas, Kelly  Active Low  Itching    Comments:  RASH     09/19/13 1430 Wallace Cullens Joy  Deleted Low  Itching    Comments:  RASH     09/04/09 Ginette Pitman  Active   Itching     Comments:  RASH     10/10/07   Active       Comments:  RASH           STRAWBERRY       Noted Status Severity Type Reaction    03/18/11 1634 Johnnette Barrios  Deleted       10/10/08 [name], MD  Deleted       10/10/07   Active             MELOXICAM       Noted Status Severity Type Reaction    11/24/18 0857 Kisner, Bishop Limbo, RN 06/26/09 Active Low  Nausea/ Vomiting    Comments:  Nervous and shakes      12/10/15 0941 Pantojas, Kristy 06/26/09 Active Low  Nausea/ Vomiting    06/26/09 Corky Sing Nacole 06/26/09 Active   Nausea/ Vomiting          NICKEL       Noted Status Severity Type Reaction    11/24/18 0856 Kisner, Bishop Limbo, RN 07/24/12 Active  Topical     Comments:  Blisters       07/24/12 1041 [name], MD 07/24/12 Active  Topical  BLUE DYE       Noted Status Severity Type Reaction    07/24/12 1041 [name], MD 07/24/12 Active  Topical     Comments:  Told to avoid due to patch test result by [facility] [place]           DULOXETINE       Noted Status Severity Type Reaction    07/24/12 1052 [name], MD 07/24/12 Active Medium Systemic     Comments:  Rash on feet and hands           NAPROXEN       Noted Status Severity Type Reaction    11/01/12 0913 Sanjuana Letters, MA 11/01/12 Active Low  Nausea/ Vomiting          ATORVASTATIN       Noted Status Severity Type Reaction    06/19/18 2149 Erskine Squibb, RX STUDENT 01/27/15 Active  Side Effect Myalgia    Comments:  Muscle pain     01/27/15 1035 [name], MD 01/27/15 Active  Side Effect     Comments:  Muscle pain           FLAVORING AGENT       Noted Status Severity Type Reaction    04/03/18 1905 Suella Grove, MA 05/03/17 Active             NSAIDS (NON-STEROIDAL ANTI-INFLAMMATORY DRUG)       Noted Status Severity Type Reaction    11/24/18 0857 Kisner, Bishop Limbo, RN 05/03/17 Active Low  Nausea/ Vomiting    04/03/18 1905 Suella [place], MA 05/03/17 Active                  SOCIAL HISTORY:  Social  History     Socioeconomic History   . Marital status: Divorced     Spouse name: Not on file   . Number of children: Not on file   . Years of education: Not on file   . Highest education level: Not on file   Occupational History   . 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 Financial resource strain: Not on file   . Food insecurity     Worry: Not on file     Inability: Not on file   . Transportation needs     Medical: Not on file     Non-medical: Not on file   Tobacco Use   . Smoking status: Former Smoker     Packs/day: 2.00     Years: 40.00     Pack years: 80.00     Types: Cigarettes     Last attempt to quit: 01/22/2005     Years since quitting: 14.2   . Smokeless tobacco: Never Used   Substance and Sexual Activity   . Alcohol use: No     Alcohol/week: 0.0 standard drinks   . Drug use: No   . Sexual activity: Yes     Partners: Male   Lifestyle   . Physical activity     Days per week: Not on file     Minutes per session: Not on file   . Stress: Not on file   Relationships   . Social Product manager on phone: Not on file     Gets together: Not on file     Attends religious service: Not on file     Active  member of club or organization: Not on file     Attends meetings of clubs or organizations: Not on file     Relationship status: Not on file   . Intimate partner violence     Fear of current or ex partner: Not on file     Emotionally abused: Not on file     Physically abused: Not on file     Forced sexual activity: Not on file   Other Topics Concern   . Abuse/Domestic Violence Not Asked   . Breast Self Exam Not Asked   . Caffeine Concern Not Asked   . Calcium intake adequate Not Asked   . Computer Use Not Asked   . Exercise Concern Not Asked   . Helmet Use Not Asked   . Seat Belt Not Asked   . Special Diet Not Asked   . Sunscreen used Not Asked   . Uses Cane No   . Uses walker No   . Uses wheelchair No   . Right hand dominant Yes   . Left hand  dominant No   . Ambidextrous No   . Ability to Walk 1 Flight of Steps without SOB/CP Yes   . Routine Exercise Yes     Comment: walk   . Ability to Walk 2 Flight of Steps without SOB/CP No     Comment: shortness of breath, no cp    . Unable to Ambulate Not Asked   . Total Care Not Asked   . Ability To Do Own ADL's Yes   . Uses Walker Not Asked   . Other Activity Level Not Asked   . Uses Cane Not Asked   . Drives Not Asked   . Shift Work No   . Unusual Sleep-Wake Schedule No   Social History Narrative   . Not on file     PHYSICAL EXAM:  BP 130/82   Pulse (!) 113   Temp 36.8 C (98.2 F) (Thermal Scan)   Resp 18   Ht 1.651 m (5\' 5" )   Wt 98.6 kg (217 lb 6 oz)   BMI 36.17 kg/m       Body mass index is 36.17 kg/m.  General Appearance: Pleasant, cooperative, healthy, and in no acute distress.  Eyes: Conjunctivae/corneas clear, EOM's intact.  Head and Face: Normocephalic, atraumatic.  Face symmetric, no obvious lesions.   Ears: Normal shape and position.   Nose:  External pyramid midline. Septum midline.   Hypopharynx/Larynx: Voice normal.  Neck: Anterior neck incision healing well with minimal edema.  No erythema or warmth.    Cardiovascular:  Good perfusion of upper extremities.  No cyanosis of the hands or fingers.  Lungs: No apparent stridorous breathing. No acute distress.  Neurologic: Cranial nerves: grossly intact.  Psychiatric:  Alert and oriented x 3.    PATHOLOGY:   Recent Results (from the past 720 hour(s))   [facility]       [place], [place] [address]       Phone: [phone]                   Fax: [phone]              Surgical Pathology Report  Patient Name: SHERRITA, RIEDERER. Accession #: [id number] Med. Rec. #: [id number]  Client: [place]: 04/05/2019 DOB: 1956-10-10 (Age: 41) Location: [facility]  Received: 04/05/2019 Gender:  F Service: Otolaryngology Reported:  04/06/2019 12:08    Billing #: [id number]   Physician(s):  Delfino Lovett, M.D.    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          Comment  There is a solitary nodule with a thin fibrous capsule favoring adenoma over  hyperplastic nodule which is also a consideration.  No capsular or vascular  invasion is seen (entire nodule submitted).              hjw - 04/06/2019 Electronically Signed By: Viviana Simpler, MD  04/06/2019 12:08           Microscopic  Performed.          Clinical History  Pre-Op Diagnosis:  Right thyroid nodule.   OR Room and Phone #: OR [phone] [phone]   Surgical Procedure:  Right hemithyroidectomy.   OR Location->[facility] 5[facility]              Gross Description  Part A is received in formalin labeled "[place], [place]ilde[place]" and "right  hemithyroidectomy."  It consists of a 7.6 g right hemithyroidectomy specimen  with a stitch indicating the superior pole.  The specimen measures 5.2 cm from  superior to inferior, 2.5 cm from medial to lateral and 1.0 cm from anterior to  posterior.  There is a 1.0 x 0.4 x 0.3 cm attached portion of isthmus.  The  overlying capsule is intact except 1.0 cm defect on the posterior capsule  consistent with iatrogenic injury. The specimen is differentially inked  (anterior-blue, posterior-black, isthmus-orange) and serially sectioned from  superior to inferior to reveal a 1.2 x 1.1 x 0.6 cm tan, ovoid, encapsulated  mass involving the lower pole. The mass abuts but does not involve the anterior  capsule, comes to within 0.1 cm of the posterior capsule and is 0.8 cm from the  isthmus.  The uninvolved parenchyma is dark red-brown and beefy.  No other  lesions are identified grossly. Representative sections are submitted as  follows:   A1:  Upper pole, two full-thickness sections  A2:  Midpole two full-thickness sections  A3-A5:  Entire  mass, full-thickness section  A6:  Lower pole, two full-thickness sections, to include nearest isthmus.               tjwpa - 04/05/2019                                                                                                     Conchita Paris, PA (ASCP)           ASSESSMENT/PLAN:  This is a 63 y.o. female who presents for follow-up of increased post-op pain s/p right hemithyroidectomy 7/79/39 for follicular adenoma.  Patient doing well, pain has resolved.  Will place orders to check TSH today and repeat thyroid US in 07/2019 (1 year from previous).  Plan to see her back same day as imaging or sooner if new concerns arise.     A shared visit was performed with the co-signing physician.    Logan Bores, PA-C  04/23/2019, 14:04      I saw and examined the patient.  I reviewed the resident's note.  I agree with the findings and plan of care as documented in the resident's note.  Any exceptions/additions are edited/noted.    Delfino Lovett, MD

## 2019-04-24 ENCOUNTER — Telehealth (HOSPITAL_BASED_OUTPATIENT_CLINIC_OR_DEPARTMENT_OTHER): Payer: Self-pay | Admitting: Physician Assistant

## 2019-04-24 NOTE — Telephone Encounter (Signed)
Called patient to advise that TSH was WNL.  Patient expressed good understanding.    Logan Bores, PA-C  04/24/2019, 13:06

## 2019-04-25 ENCOUNTER — Other Ambulatory Visit (HOSPITAL_BASED_OUTPATIENT_CLINIC_OR_DEPARTMENT_OTHER): Payer: Self-pay | Admitting: Family Medicine

## 2019-04-25 ENCOUNTER — Ambulatory Visit
Admission: RE | Admit: 2019-04-25 | Discharge: 2019-04-25 | Disposition: A | Discharge status: Home / Self Care | Payer: MEDICAID | Source: Ambulatory Visit | Attending: Physician Assistant | Admitting: Physician Assistant

## 2019-04-25 ENCOUNTER — Encounter (HOSPITAL_BASED_OUTPATIENT_CLINIC_OR_DEPARTMENT_OTHER): Payer: Self-pay

## 2019-04-25 ENCOUNTER — Other Ambulatory Visit: Payer: Self-pay

## 2019-04-25 DIAGNOSIS — Z1231 Encounter for screening mammogram for malignant neoplasm of breast: Secondary | ICD-10-CM

## 2019-04-25 DIAGNOSIS — Z1239 Encounter for other screening for malignant neoplasm of breast: Secondary | ICD-10-CM

## 2019-04-25 DIAGNOSIS — E559 Vitamin D deficiency, unspecified: Secondary | ICD-10-CM

## 2019-05-03 ENCOUNTER — Other Ambulatory Visit: Payer: Self-pay

## 2019-05-03 ENCOUNTER — Ambulatory Visit: Payer: MEDICAID | Attending: Otolaryngology | Admitting: Physician Assistant

## 2019-05-03 ENCOUNTER — Other Ambulatory Visit (INDEPENDENT_AMBULATORY_CARE_PROVIDER_SITE_OTHER): Payer: Self-pay

## 2019-05-03 ENCOUNTER — Encounter (HOSPITAL_BASED_OUTPATIENT_CLINIC_OR_DEPARTMENT_OTHER): Payer: Self-pay | Admitting: Physician Assistant

## 2019-05-03 VITALS — BP 123/66 | HR 96 | Temp 97.5°F | Resp 18 | Ht 65.0 in | Wt 218.7 lb

## 2019-05-03 DIAGNOSIS — Z87891 Personal history of nicotine dependence: Secondary | ICD-10-CM | POA: Insufficient documentation

## 2019-05-03 DIAGNOSIS — R221 Localized swelling, mass and lump, neck: Secondary | ICD-10-CM

## 2019-05-03 DIAGNOSIS — T8149XA Infection following a procedure, other surgical site, initial encounter: Secondary | ICD-10-CM | POA: Insufficient documentation

## 2019-05-03 DIAGNOSIS — Z86018 Personal history of other benign neoplasm: Secondary | ICD-10-CM | POA: Insufficient documentation

## 2019-05-03 DIAGNOSIS — L0211 Cutaneous abscess of neck: Secondary | ICD-10-CM

## 2019-05-03 MED ORDER — SULFAMETHOXAZOLE 800 MG-TRIMETHOPRIM 160 MG TABLET: 1.0000 | ORAL_TABLET | Freq: Two times a day (BID) | ORAL | 0 refills | Status: DC

## 2019-05-07 NOTE — Progress Notes (Addendum)
[facility], [place]  [place] [address]  [phone]    Patient Name: [name]   MRN:  [id number]   DOB: 07/01/1956   Date of Service:  05/03/2019     ENT HEAD AND NECK ONCOLOGY SURGERY AND RECONSTRUCTION     FOLLOW UP NOTE     HPI: This is a 63 y.o. 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  She was seen for post-op visit 04/13/19, at which time she reported an acute increase in pain over the incision site, and pain with coughing and eating.  CT neck 04/13/19 was reviewed with our reading neuroradiologist, demonstrating expected post-operative changes without obvious fluid collection.  She was prescribed Augmentin x 10 days for potential cellulitis.  The patient states she was doing well until about 2-3 days after completing the antibiotic, at which time she began to have pain again.  When she woke up this morning, her incision site was red and swollen.  She denies fever, purulent drainage, dysphagia, and difficulty breathing.    Past Medical History:   Diagnosis Date   . Anxiety    . Arthritis    . Cancer (CMS HCC)     internal skin cancer   . Depression    . Dyspnea on exertion    . Essential hypertension 02/16/2017    patient denies   . GERD (gastroesophageal reflux disease)     controlled w/med   . Headache    . Heart murmur 1979    benign   . Hyperlipidemia LDL goal < 100 08/29/2013   . MINOR CAD (coronary artery disease) 11/10/2012    no treatment other than cholesterol medications; follows regularly with Dr. Rodney Booze   . Multinodular goiter    . Muscle weakness     neck    . Obesity    . Peripheral edema    . Rash     face only eczema   . Rheumatic fever 16    no complications   . Squamous cell carcinoma 02/06/2015    right side of neck   . Vaginal prolapse 2012    surgery improved significantly   . Wears glasses      Past Surgical History:  Past Surgical  History:   Procedure Laterality Date   . COLONOSCOPY  01/06/09    COLONOSCOPY performed by Loletta Parish F at [place]   . GASTROSCOPY  03/05/2010    GASTROSCOPY performed by Jocelyn Lamer at [place]   . GASTROSCOPY WITH BIOPSY  03/05/2010    GASTROSCOPY WITH BIOPSY performed by Jocelyn Lamer at [place]   . HX ADENOIDECTOMY     . HX ANKLE FRACTURE [place]  2007    left distal fibula, casted   . HX APPENDECTOMY     . HX CHOLECYSTECTOMY     . HX COLONOSCOPY     . HX CYSTOCELE REPAIR  09/24/09   . HX HAND SURGERY  2012    for Carpal tunnel    . HX OOPHORECTOMY      left ovary removed   . HX TONSILLECTOMY     . HX TOTAL VAGINAL HYSTERECTOMY  1979   . HX WRIST FRACTURE [place]  2007    [place] injury   . PB REVISE ULNAR NERVE AT ELBOW Left 1979   . PB UPPER GI ENDOSCOPY,BIOPSY  12/19/07  patulous GE junction zone, erythema, nonerosive GERD     Family History: Patient's family history was reviewed in writing and with the patient with findings as noted below. Unless otherwise noted, patient has no family history of similar problems.   Family Medical History:     Problem Relation (Age of Onset)    Bipolar Disorder Daughter    Breast Cancer Paternal Aunt, Paternal Aunt, Paternal Aunt, Paternal 60, Paternal 46, Other    Cancer Paternal Aunt, Paternal 31, Paternal 56, Paternal 58, Paternal 34, Other (66)    Congestive Heart Failure Father (67)    Coronary Artery Disease Father, Mother (1)    Diabetes Mother    Heart Attack Father, Sister    High Cholesterol Mother    Hypertension (High Blood Pressure) Mother, Brother, Sister, Sister    Kidney Disease Sister    Leukemia Paternal Uncle, Paternal Uncle    Stroke Paternal Grandmother    Thyroid Disease Sister        MEDICATIONS:    Current Outpatient Medications:   .  acetaminophen (TYLENOL) 325 mg Oral Tablet, Take 2 Tabs (650 mg total) by mouth Three times a day (Patient taking differently: Take 650 mg by mouth Every 4 hours as needed ), Disp: 180 Tab, Rfl: 0  .  aspirin  (ECOTRIN) 81 mg Oral Tablet, Delayed Release (E.C.), Take 81 mg by mouth Once a day, Disp: , Rfl:   .  Azelaic Acid 20 % Cream, Apply topically Twice daily (Patient taking differently: Apply topically Twice per day as needed ), Disp: 50 g, Rfl: 0  .  cholecalciferol, vitamin D3, (VITAMIN D) 25 mcg (1,000 unit) Oral Tablet, Take 1 Tab (1,000 Units total) by mouth Once a day, Disp: 30 Tab, Rfl: 3  .  Clobetasol (TEMOVATE) 0.05 % Ointment, Apply topically Twice daily To rash on hands and feet (Patient taking differently: Apply topically Twice per day as needed To rash on hands and feet), Disp: 60 g, Rfl: 0  .  clobetasoL (TEMOVATE) 0.05 % Ointment, Apply topically, Disp: , Rfl:   .  diclofenac sodium (VOLTAREN) 75 mg Oral Tablet, Delayed Release (E.C.), Take 1 Tab (75 mg total) by mouth Twice daily (Patient not taking: Reported on 04/02/2019), Disp: 30 Tab, Rfl: 0  .  erythromycin (ROMYCIN) 5 mg/gram (0.5 %) Ophthalmic Ointment, Instill into right eye Twice daily, Disp: 1 g, Rfl: 0  .  FLUoxetine (SARAFEM) 20 mg Oral Tablet, Take 1 Tab (20 mg total) by mouth Twice daily, Disp: 60 Tab, Rfl: 5  .  fluticasone propionate (FLONASE) 50 mcg/actuation Nasal Spray, Suspension, 1 Spray by Each Nostril route Once a day, Disp: 16 g, Rfl: 3  .  gabapentin (NEURONTIN) 600 mg Oral Tablet, Take 1 Tab (600 mg total) by mouth Twice daily, Disp: 60 Tab, Rfl: 4  .  Halobetasol Propionate (ULTRAVATE) 0.05 % Cream, Apply  topically. Three times a week (Patient taking differently: Twice per day as needed Apply  topically. Three times a week), Disp: 15 g, Rfl: 2  .  Halobetasol Propionate (ULTRAVATE) 0.05 % Cream, Apply topically, Disp: , Rfl:   .  HYDROcodone-acetaminophen (NORCO) 5-325 mg Oral Tablet, Take 1 Tab by mouth Every 4 hours as needed, Disp: 20 Tab, Rfl: 0  .  hydrOXYzine HCL (ATARAX) 50 mg Oral Tablet, TAKE 1 TABLET BY MOUTH EVERY NIGHT, Disp: 30 Tab, Rfl: 1  .  methocarbamoL (ROBAXIN) 500 mg Oral Tablet, Take 1 Tab (500 mg total)  by mouth Four  times a day For muscle spasms, Disp: 60 Tab, Rfl: 0  .  Metronidazole (METROCREAM) 0.75 % Cream, Apply topically Twice daily (Patient taking differently: Apply topically Twice per day as needed ), Disp: 45 g, Rfl: 1  .  Metronidazole (METROCREAM) 0.75 % Cream, Apply topically, Disp: , Rfl:   .  mineral oil-hydrophil petrolat (AQUAPHOR) Ointment, Apply topically, Disp: , Rfl:   .  montelukast (SINGULAIR) 10 mg Oral Tablet, Take 1 Tab (10 mg total) by mouth Every evening, Disp: 30 Tab, Rfl: 3  .  nitroGLYCERIN (NITROSTAT) 0.4 mg Sublingual Tablet, Sublingual, 1 Tab (0.4 mg total) by Sublingual route Every 5 minutes as needed for Chest pain for up to 3 doses for 3 doses over 15 minutes, Disp: 20 Tab, Rfl: 0  .  nystatin (NYSTOP) 100,000 unit/gram Powder, Apply once or twice daily to PREVENT rash under breasts, Disp: 60 g, Rfl: 11  .  nystatin (NYSTOP) 100,000 unit/gram Powder, Apply 1 g topically, Disp: , Rfl:   .  pantoprazole (PROTONIX) 40 mg Oral Tablet, Delayed Release (E.C.), Take 1 Tab (40 mg total) by mouth Every morning before breakfast, Disp: 30 Tab, Rfl: 5  .  tolterodine (DETROL) 2 mg Oral Tablet, Take 2 mg by mouth Once a day, Disp: , Rfl:   .  traZODone (DESYREL) 50 mg Oral Tablet, Take 1/2 to full tablet as needed for sleep., Disp: 30 Tab, Rfl: 3  .  triamcinolone acetonide (ARISTOCORT A) 0.1 % Ointment, by Apply Topically route Twice daily (Patient taking differently: by Apply Topically route Twice per day as needed ), Disp: 80 g, Rfl: 2  .  triamcinolone acetonide (ARISTOCORT A) 0.1 % Ointment, Apply topically, Disp: , Rfl:   .  trimethoprim-sulfamethoxazole (BACTRIM DS) 160-800mg  per tablet, Take 1 Tab (160 mg total) by mouth Every 12 hours for 7 days, Disp: 14 Tab, Rfl: 0     ALLERGIES:  Allergy History as of 05/07/19     CODEINE       Noted Status Severity Type Reaction    12/10/15 0941 Pantojas, Whalan  Active Low  Itching    Comments:  RASH     09/19/13 1430 Wallace Cullens Joy   Deleted Low  Itching    Comments:  RASH     09/04/09 Ginette Pitman  Active   Itching    Comments:  RASH     10/10/07   Active       Comments:  RASH           STRAWBERRY       Noted Status Severity Type Reaction    03/18/11 1634 Johnnette Barrios  Deleted       10/10/08 [name], MD  Deleted       10/10/07   Active             MELOXICAM       Noted Status Severity Type Reaction    11/24/18 0857 Kisner, Bishop Limbo, RN 06/26/09 Active Low  Nausea/ Vomiting    Comments:  Nervous and shakes      12/10/15 0941 Pantojas, Kristy 06/26/09 Active Low  Nausea/ Vomiting    06/26/09 Corky Sing Nacole 06/26/09 Active   Nausea/ Vomiting          NICKEL       Noted Status Severity Type Reaction    11/24/18 0856 Kisner, Bishop Limbo, RN 07/24/12 Active  Topical     Comments:  Blisters  07/24/12 1041 [name], MD 07/24/12 Active  Topical           BLUE DYE       Noted Status Severity Type Reaction    07/24/12 1041 [name], MD 07/24/12 Active  Topical     Comments:  Told to avoid due to patch test result by Derm [place]           DULOXETINE       Noted Status Severity Type Reaction    07/24/12 1052 [name], MD 07/24/12 Active Medium Systemic     Comments:  Rash on feet and hands           NAPROXEN       Noted Status Severity Type Reaction    11/01/12 0913 Sanjuana Letters, MA 11/01/12 Active Low  Nausea/ Vomiting          ATORVASTATIN       Noted Status Severity Type Reaction    06/19/18 2149 Erskine Squibb, RX STUDENT 01/27/15 Active  Side Effect Myalgia    Comments:  Muscle pain     01/27/15 1035 [name], MD 01/27/15 Active  Side Effect     Comments:  Muscle pain           FLAVORING AGENT       Noted Status Severity Type Reaction    04/03/18 1905 Suella Grove, MA 05/03/17 Active             NSAIDS (NON-STEROIDAL ANTI-INFLAMMATORY DRUG)       Noted Status Severity Type Reaction    11/24/18 0857 Kisner, Bishop Limbo, RN 05/03/17 Active Low  Nausea/ Vomiting     04/03/18 1905 Suella [place], MA 05/03/17 Active                  SOCIAL HISTORY:  Social History     Socioeconomic History   . Marital status: Divorced     Spouse name: Not on file   . Number of children: Not on file   . Years of education: Not on file   . Highest education level: Not on file   Occupational History   . 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 Financial resource strain: Not on file   . Food insecurity     Worry: Not on file     Inability: Not on file   . Transportation needs     Medical: Not on file     Non-medical: Not on file   Tobacco Use   . Smoking status: Former Smoker     Packs/day: 2.00     Years: 40.00     Pack years: 80.00     Types: Cigarettes     Last attempt to quit: 01/22/2005     Years since quitting: 14.2   . Smokeless tobacco: Never Used   Substance and Sexual Activity   . Alcohol use: No     Alcohol/week: 0.0 standard drinks   . Drug use: No   . Sexual activity: Yes     Partners: Male   Lifestyle   . Physical activity     Days per week: Not on file     Minutes per session: Not on file   . Stress: Not on file   Relationships   . Social connections     Talks on phone: Not on file  Gets together: Not on file     Attends religious service: Not on file     Active member of club or organization: Not on file     Attends meetings of clubs or organizations: Not on file     Relationship status: Not on file   . Intimate partner violence     Fear of current or ex partner: Not on file     Emotionally abused: Not on file     Physically abused: Not on file     Forced sexual activity: Not on file   Other Topics Concern   . Abuse/Domestic Violence Not Asked   . Breast Self Exam Not Asked   . Caffeine Concern Not Asked   . Calcium intake adequate Not Asked   . Computer Use Not Asked   . Exercise Concern Not Asked   . Helmet Use Not Asked   . Seat Belt Not Asked   . Special Diet Not Asked   . Sunscreen used Not Asked   . Uses Cane  No   . Uses walker No   . Uses wheelchair No   . Right hand dominant Yes   . Left hand dominant No   . Ambidextrous No   . Ability to Walk 1 Flight of Steps without SOB/CP Yes   . Routine Exercise Yes     Comment: walk   . Ability to Walk 2 Flight of Steps without SOB/CP No     Comment: shortness of breath, no cp    . Unable to Ambulate Not Asked   . Total Care Not Asked   . Ability To Do Own ADL's Yes   . Uses Walker Not Asked   . Other Activity Level Not Asked   . Uses Cane Not Asked   . Drives Not Asked   . Shift Work No   . Unusual Sleep-Wake Schedule No   Social History Narrative   . Not on file     PHYSICAL EXAM:  BP 123/66   Pulse 96   Temp 36.4 C (97.5 F) (Thermal Scan)   Resp 18   Ht 1.651 m (5\' 5" )   Wt 99.2 kg (218 lb 11.1 oz)   BMI 36.39 kg/m     Body mass index is 36.39 kg/m.  General Appearance: Pleasant, cooperative, healthy, and in no acute distress.  Eyes: Conjunctivae/corneas clear, EOM's intact.  Head and Face: Normocephalic, atraumatic.  Face symmetric, no obvious lesions.   Ears: Normal shape and position.   Nose:  External pyramid midline. Septum midline.   Hypopharynx/Larynx: Voice normal.  Neck: Anterior neck with firm edema and erythema, no palpable fluctuance.  Abscess was drained and wound packed with ribbon gauze.  Cardiovascular:  Good perfusion of upper extremities.  No cyanosis of the hands or fingers.  Lungs: No apparent stridorous breathing. No acute distress.  Neurologic: Cranial nerves: grossly intact.  Psychiatric:  Alert and oriented x 3.        PROCEDURES:  ENT Procedure Note    Pre-Op Diagnosis: Anterior neck abscess  Post-Op Diagnosis: Anterior neck abscess    Procedure: The area to be drained was cleansed with betadine and anesthetized using 1% lidocaine with epinephrine.  Consent was signed and time-out was performed.  The abscess was opened using a 15 blade scalpel and purulent drainage was expressed.  Culture swabs were collected.  The wound was irrigated with  normal saline and packed with ribbon gauze.  There were no complications.  The  patient tolerated the procedure well.    Logan Bores, PA-C    PATHOLOGY:   No results found for this or any previous visit (from the past 720 hour(s)).     ASSESSMENT/PLAN:   This is a 63 y.o. 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  Abscess drained and packed in clinic.  Culture swabs collected and sent to lab.  Will prescribe Bactrim x 7 days and adjust as necessary based on sensitivity results.  Wound packing supplies provided to patient for daily packing and dressing changes.  Plan to see her back in 5 days for follow up or sooner if new concerns arise.    A shared visit was performed with the co-signing physician.    Logan Bores, PA-C      I saw and examined the patient.  I reviewed the resident's note.  I agree with the findings and plan of care as documented in the resident's note.  Any exceptions/additions are edited/noted. Patient with anterior neck fluctuance and purulence from incision line. Drained in clinic. No airway symptoms. Will treat with packing and abx.     Delfino Lovett, MD

## 2019-05-09 ENCOUNTER — Encounter (HOSPITAL_BASED_OUTPATIENT_CLINIC_OR_DEPARTMENT_OTHER): Payer: Self-pay | Admitting: Otolaryngology

## 2019-05-09 ENCOUNTER — Other Ambulatory Visit: Payer: Self-pay

## 2019-05-09 ENCOUNTER — Encounter (HOSPITAL_BASED_OUTPATIENT_CLINIC_OR_DEPARTMENT_OTHER): Payer: Self-pay | Admitting: Family Medicine

## 2019-05-09 ENCOUNTER — Ambulatory Visit: Payer: MEDICAID | Attending: Otolaryngology | Admitting: Otolaryngology

## 2019-05-09 VITALS — BP 133/72 | HR 86 | Temp 97.8°F | Resp 18 | Ht 65.0 in | Wt 219.6 lb

## 2019-05-09 DIAGNOSIS — Z79891 Long term (current) use of opiate analgesic: Secondary | ICD-10-CM | POA: Insufficient documentation

## 2019-05-09 DIAGNOSIS — Z87891 Personal history of nicotine dependence: Secondary | ICD-10-CM | POA: Insufficient documentation

## 2019-05-09 DIAGNOSIS — Z79899 Other long term (current) drug therapy: Secondary | ICD-10-CM | POA: Insufficient documentation

## 2019-05-09 DIAGNOSIS — T148XXA Other injury of unspecified body region, initial encounter: Secondary | ICD-10-CM

## 2019-05-09 DIAGNOSIS — Z7982 Long term (current) use of aspirin: Secondary | ICD-10-CM | POA: Insufficient documentation

## 2019-05-09 DIAGNOSIS — E785 Hyperlipidemia, unspecified: Secondary | ICD-10-CM | POA: Insufficient documentation

## 2019-05-09 DIAGNOSIS — Z791 Long term (current) use of non-steroidal anti-inflammatories (NSAID): Secondary | ICD-10-CM | POA: Insufficient documentation

## 2019-05-09 DIAGNOSIS — I2510 Atherosclerotic heart disease of native coronary artery without angina pectoris: Secondary | ICD-10-CM | POA: Insufficient documentation

## 2019-05-09 DIAGNOSIS — F419 Anxiety disorder, unspecified: Secondary | ICD-10-CM | POA: Insufficient documentation

## 2019-05-09 DIAGNOSIS — K219 Gastro-esophageal reflux disease without esophagitis: Secondary | ICD-10-CM | POA: Insufficient documentation

## 2019-05-09 DIAGNOSIS — F329 Major depressive disorder, single episode, unspecified: Secondary | ICD-10-CM | POA: Insufficient documentation

## 2019-05-09 DIAGNOSIS — L089 Local infection of the skin and subcutaneous tissue, unspecified: Secondary | ICD-10-CM

## 2019-05-09 DIAGNOSIS — E042 Nontoxic multinodular goiter: Secondary | ICD-10-CM | POA: Insufficient documentation

## 2019-05-09 DIAGNOSIS — D34 Benign neoplasm of thyroid gland: Secondary | ICD-10-CM | POA: Insufficient documentation

## 2019-05-09 MED ORDER — SULFAMETHOXAZOLE 800 MG-TRIMETHOPRIM 160 MG TABLET
1.00 | ORAL_TABLET | Freq: Two times a day (BID) | ORAL | 0 refills | Status: DC
Start: 2019-05-09 — End: 2019-05-14

## 2019-05-09 NOTE — Progress Notes (Signed)
[facility], [place]  [place] [id number]  [phone]    Patient Name: [name]   MRN:  [id number]   DOB: [birth year]   Date of Service:  05/09/2019     ENT HEAD AND NECK ONCOLOGY SURGERY AND RECONSTRUCTION     FOLLOW UP NOTE     HPI: This is a 63 y.o. female who presents for follow-up. The patient underwent a hemithyroidectomy 04/06/2019. Her post-operative course was complicated by a post-operative infection that was initially treated with Augmentin and improved. She later returned with a fluid collection and purulence expressed from the incision. The patient was sent home on antibiotics and wound packing instructions. She comes in today for follow-up. She has not been changing the packing twice a day and has been doing it every two days. She has not had any difficulty breathing. Occasional difficulty swallowing.     Past Medical History:  Past Medical History:   Diagnosis Date   . Anxiety    . Arthritis    . Cancer (CMS HCC)     internal skin cancer   . Depression    . Dyspnea on exertion    . Essential hypertension 02/16/2017    patient denies   . GERD (gastroesophageal reflux disease)     controlled w/med   . Headache    . Heart murmur 1979    benign   . Hyperlipidemia LDL goal < 100 08/29/2013   . MINOR CAD (coronary artery disease) 11/10/2012    no treatment other than cholesterol medications; follows regularly with Dr. Rodney Booze   . Multinodular goiter    . Muscle weakness     neck    . Obesity    . Peripheral edema    . Rash     face only eczema   . Rheumatic fever 16    no complications   . Squamous cell carcinoma 02/06/2015    right side of neck   . Vaginal prolapse 2012    surgery improved significantly   . Wears glasses            Past Surgical History:  Past Surgical History:   Procedure Laterality Date   . COLONOSCOPY  01/06/09    COLONOSCOPY performed by Loletta Parish F at [place]   . GASTROSCOPY  03/05/2010    GASTROSCOPY performed by Jocelyn Lamer at [place]   . GASTROSCOPY WITH BIOPSY  03/05/2010    GASTROSCOPY WITH BIOPSY performed by Jocelyn Lamer at [place]   . HX ADENOIDECTOMY     . HX ANKLE FRACTURE [place]  2007    left distal fibula, casted   . HX APPENDECTOMY     . HX CHOLECYSTECTOMY     . HX COLONOSCOPY     . HX CYSTOCELE REPAIR  09/24/09   . HX HAND SURGERY  2012    for Carpal tunnel    . HX OOPHORECTOMY      left ovary removed   . HX TONSILLECTOMY     . HX TOTAL VAGINAL HYSTERECTOMY  1979   . HX WRIST FRACTURE [place]  2007    [place] injury   . PB REVISE ULNAR NERVE AT ELBOW Left 1979   . PB UPPER GI ENDOSCOPY,BIOPSY  12/19/07    patulous GE junction zone, erythema, nonerosive GERD           Family History: Patient's family history was reviewed in  writing and with the patient with findings as noted below. Unless otherwise noted, patient has no family history of similar problems.   Family Medical History:     Problem Relation (Age of Onset)    Bipolar Disorder Daughter    Breast Cancer Paternal Aunt, Paternal Aunt, Paternal Aunt, Paternal 32, Paternal 81, Other    Cancer Paternal Aunt, Paternal 22, Paternal 83, Paternal 41, Paternal 52, Other (80)    Congestive Heart Failure Father (65)    Coronary Artery Disease Father, Mother (41)    Diabetes Mother    Heart Attack Father, Sister    High Cholesterol Mother    Hypertension (High Blood Pressure) Mother, Brother, Sister, Sister    Kidney Disease Sister    Leukemia Paternal Uncle, Paternal Uncle    Stroke Paternal Grandmother    Thyroid Disease Sister               MEDICATIONS:    Current Outpatient Medications:   .  acetaminophen (TYLENOL) 325 mg Oral Tablet, Take 2 Tabs (650 mg total) by mouth Three times a day (Patient taking differently: Take 650 mg by mouth Every 4 hours as needed ), Disp: 180 Tab, Rfl: 0  .  aspirin (ECOTRIN) 81 mg Oral Tablet, Delayed Release (E.C.), Take 81 mg by mouth Once a day, Disp: , Rfl:   .  Azelaic Acid 20 % Cream, Apply topically Twice daily (Patient taking  differently: Apply topically Twice per day as needed ), Disp: 50 g, Rfl: 0  .  cholecalciferol, vitamin D3, (VITAMIN D) 25 mcg (1,000 unit) Oral Tablet, Take 1 Tab (1,000 Units total) by mouth Once a day, Disp: 30 Tab, Rfl: 3  .  Clobetasol (TEMOVATE) 0.05 % Ointment, Apply topically Twice daily To rash on hands and feet (Patient taking differently: Apply topically Twice per day as needed To rash on hands and feet), Disp: 60 g, Rfl: 0  .  clobetasoL (TEMOVATE) 0.05 % Ointment, Apply topically, Disp: , Rfl:   .  diclofenac sodium (VOLTAREN) 75 mg Oral Tablet, Delayed Release (E.C.), Take 1 Tab (75 mg total) by mouth Twice daily (Patient not taking: Reported on 04/02/2019), Disp: 30 Tab, Rfl: 0  .  erythromycin (ROMYCIN) 5 mg/gram (0.5 %) Ophthalmic Ointment, Instill into right eye Twice daily, Disp: 1 g, Rfl: 0  .  FLUoxetine (SARAFEM) 20 mg Oral Tablet, Take 1 Tab (20 mg total) by mouth Twice daily, Disp: 60 Tab, Rfl: 5  .  fluticasone propionate (FLONASE) 50 mcg/actuation Nasal Spray, Suspension, 1 Spray by Each Nostril route Once a day, Disp: 16 g, Rfl: 3  .  gabapentin (NEURONTIN) 600 mg Oral Tablet, Take 1 Tab (600 mg total) by mouth Twice daily, Disp: 60 Tab, Rfl: 4  .  Halobetasol Propionate (ULTRAVATE) 0.05 % Cream, Apply  topically. Three times a week (Patient taking differently: Twice per day as needed Apply  topically. Three times a week), Disp: 15 g, Rfl: 2  .  Halobetasol Propionate (ULTRAVATE) 0.05 % Cream, Apply topically, Disp: , Rfl:   .  HYDROcodone-acetaminophen (NORCO) 5-325 mg Oral Tablet, Take 1 Tab by mouth Every 4 hours as needed, Disp: 20 Tab, Rfl: 0  .  hydrOXYzine HCL (ATARAX) 50 mg Oral Tablet, TAKE 1 TABLET BY MOUTH EVERY NIGHT, Disp: 30 Tab, Rfl: 1  .  methocarbamoL (ROBAXIN) 500 mg Oral Tablet, Take 1 Tab (500 mg total) by mouth Four times a day For muscle spasms, Disp: 60 Tab, Rfl: 0  .  Metronidazole (METROCREAM) 0.75 % Cream, Apply topically Twice daily (Patient taking differently:  Apply topically Twice per day as needed ), Disp: 45 g, Rfl: 1  .  Metronidazole (METROCREAM) 0.75 % Cream, Apply topically, Disp: , Rfl:   .  mineral oil-hydrophil petrolat (AQUAPHOR) Ointment, Apply topically, Disp: , Rfl:   .  montelukast (SINGULAIR) 10 mg Oral Tablet, Take 1 Tab (10 mg total) by mouth Every evening, Disp: 30 Tab, Rfl: 3  .  nitroGLYCERIN (NITROSTAT) 0.4 mg Sublingual Tablet, Sublingual, 1 Tab (0.4 mg total) by Sublingual route Every 5 minutes as needed for Chest pain for up to 3 doses for 3 doses over 15 minutes, Disp: 20 Tab, Rfl: 0  .  nystatin (NYSTOP) 100,000 unit/gram Powder, Apply once or twice daily to PREVENT rash under breasts, Disp: 60 g, Rfl: 11  .  nystatin (NYSTOP) 100,000 unit/gram Powder, Apply 1 g topically, Disp: , Rfl:   .  pantoprazole (PROTONIX) 40 mg Oral Tablet, Delayed Release (E.C.), Take 1 Tab (40 mg total) by mouth Every morning before breakfast, Disp: 30 Tab, Rfl: 5  .  tolterodine (DETROL) 2 mg Oral Tablet, Take 2 mg by mouth Once a day, Disp: , Rfl:   .  traZODone (DESYREL) 50 mg Oral Tablet, Take 1/2 to full tablet as needed for sleep., Disp: 30 Tab, Rfl: 3  .  triamcinolone acetonide (ARISTOCORT A) 0.1 % Ointment, by Apply Topically route Twice daily (Patient taking differently: by Apply Topically route Twice per day as needed ), Disp: 80 g, Rfl: 2  .  triamcinolone acetonide (ARISTOCORT A) 0.1 % Ointment, Apply topically, Disp: , Rfl:   .  trimethoprim-sulfamethoxazole (BACTRIM DS) 160-800mg  per tablet, Take 1 Tab (160 mg total) by mouth Every 12 hours for 7 days, Disp: 14 Tab, Rfl: 0     ALLERGIES:  Allergy History as of 05/09/19     CODEINE       Noted Status Severity Type Reaction    12/10/15 0941 Pantojas, [place]  Active Low  Itching    Comments:  RASH     09/19/13 1430 Wallace Cullens Joy  Deleted Low  Itching    Comments:  RASH     09/04/09 Ginette Pitman  Active   Itching    Comments:  RASH     10/10/07   Active       Comments:  RASH           STRAWBERRY        Noted Status Severity Type Reaction    03/18/11 1634 Johnnette Barrios  Deleted       10/10/08 [name], MD  Deleted       10/10/07   Active             MELOXICAM       Noted Status Severity Type Reaction    11/24/18 0857 Kisner, Bishop Limbo, RN 06/26/09 Active Low  Nausea/ Vomiting    Comments:  Nervous and shakes      12/10/15 0941 Pantojas, Kristy 06/26/09 Active Low  Nausea/ Vomiting    06/26/09 Corky Sing N[place]ole 06/26/09 Active   Nausea/ Vomiting          NICKEL       Noted Status Severity Type Reaction    11/24/18 0856 Kisner, Bishop Limbo, RN 07/24/12 Active  Topical     Comments:  Blisters       07/24/12 1041 [name], MD 07/24/12 Active  Topical  BLUE DYE       Noted Status Severity Type Reaction    07/24/12 1041 [name], MD 07/24/12 Active  Topical     Comments:  Told to avoid due to patch test result by Derm Laughlin           DULOXETINE       Noted Status Severity Type Reaction    07/24/12 1052 [name], MD 07/24/12 Active Medium Systemic     Comments:  Rash on feet and hands           NAPROXEN       Noted Status Severity Type Reaction    11/01/12 0913 Sanjuana Letters, MA 11/01/12 Active Low  Nausea/ Vomiting          ATORVASTATIN       Noted Status Severity Type Reaction    06/19/18 2149 Erskine Squibb, RX STUDENT 01/27/15 Active  Side Effect Myalgia    Comments:  Muscle pain     01/27/15 1035 [name], MD 01/27/15 Active  Side Effect     Comments:  Muscle pain           FLAVORING AGENT       Noted Status Severity Type Reaction    04/03/18 1905 Suella [place], MA 05/03/17 Active             NSAIDS (NON-STEROIDAL ANTI-INFLAMMATORY DRUG)       Noted Status Severity Type Reaction    11/24/18 0857 Kisner, Bishop Limbo, RN 05/03/17 Active Low  Nausea/ Vomiting    04/03/18 1905 Suella [place], MA 05/03/17 Active                    SOCIAL HISTORY:  Social History     Socioeconomic History   . Marital status: Divorced     Spouse name: Not  on file   . Number of children: Not on file   . Years of education: Not on file   . Highest education level: Not on file   Occupational History   . 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 Financial resource strain: Not on file   . Food insecurity     Worry: Not on file     Inability: Not on file   . Transportation needs     Medical: Not on file     Non-medical: Not on file   Tobacco Use   . Smoking status: Former Smoker     Packs/day: 2.00     Years: 40.00     Pack years: 80.00     Types: Cigarettes     Last attempt to quit: 01/22/2005     Years since quitting: 14.3   . Smokeless tobacco: Never Used   Substance and Sexual Activity   . Alcohol use: No     Alcohol/week: 0.0 standard drinks   . Drug use: No   . Sexual activity: Yes     Partners: Male   Lifestyle   . Physical activity     Days per week: Not on file     Minutes per session: Not on file   . Stress: Not on file   Relationships   . Social Product manager on phone: Not on file     Gets together: Not on file     Attends religious service: Not on file  Active member of club or organization: Not on file     Attends meetings of clubs or organizations: Not on file     Relationship status: Not on file   . Intimate partner violence     Fear of current or ex partner: Not on file     Emotionally abused: Not on file     Physically abused: Not on file     Forced sexual activity: Not on file   Other Topics Concern   . Abuse/Domestic Violence Not Asked   . Breast Self Exam Not Asked   . Caffeine Concern Not Asked   . Calcium intake adequate Not Asked   . Computer Use Not Asked   . Exercise Concern Not Asked   . Helmet Use Not Asked   . Seat Belt Not Asked   . Special Diet Not Asked   . Sunscreen used Not Asked   . Uses Cane No   . Uses walker No   . Uses wheelchair No   . Right hand dominant Yes   . Left hand dominant No   . Ambidextrous No   . Ability to Walk 1 Flight of Steps without SOB/CP Yes     . Routine Exercise Yes     Comment: walk   . Ability to Walk 2 Flight of Steps without SOB/CP No     Comment: shortness of breath, no cp    . Unable to Ambulate Not Asked   . Total Care Not Asked   . Ability To Do Own ADL's Yes   . Uses Walker Not Asked   . Other Activity Level Not Asked   . Uses Cane Not Asked   . Drives Not Asked   . Shift Work No   . Unusual Sleep-Wake Schedule No   Social History Narrative   . Not on file         PHYSICAL EXAM:  There were no vitals taken for this visit.      There is no height or weight on file to calculate BMI.  General Appearance: Pleasant, cooperative, healthy, and in no acute distress.  Eyes: Conjunctivae/corneas clear, EOM's intact.  Head and Face: Normocephalic, atraumatic.  Face symmetric, no obvious lesions.   Ears: Normal shape and position.   Nose:  External pyramid midline. Septum midline.   Neck: Left neck with decreased erythema. No fluctuance or purulence. Nu-gauze removed with minimal purulence. Minimal tunneling (about 62mm). Nu-gauze replaced.   Cardiovascular:  Good perfusion of upper extremities.  No cyanosis of the hands or fingers.  Lungs: No apparent stridorous breathing. No acute distress.  Neurologic: Cranial nerves: grossly intact.  Psychiatric:  Alert and oriented x 3.    LABS:  Status:  Preliminary result Visible to patient:  No (Blocked) Next appt:  Today at 09:45 AM in Otolaryngology Posey Pronto, Dudley Major, MD) Dx:  Neck abscess   Specimen Information: Abcess; Other       FLC 1+ Rare Staphylococcus epidermidis, Coagulase negative StaphylococcusAbnormal              GRAM STAIN  2+ Few PMNs      No Organisms Seen                  Specimen Collected: 05/03/19 12:57 Last Resulted: 05/06/19 03:51                 ASSESSMENT/PLAN: Patient comes in for follow-up for right hemithyroidectomy and post-operative wound infection. Final pathology showed follicular adenoma. Patient's  wound is healing well. Advised to continue abx and packing twice a day. Will re-check  Monday 05/14/19 in clinic. Patient understood plan.     Delfino Lovett, MD

## 2019-05-14 ENCOUNTER — Ambulatory Visit: Payer: MEDICAID | Attending: Otolaryngology | Admitting: Otolaryngology

## 2019-05-14 ENCOUNTER — Other Ambulatory Visit: Payer: Self-pay

## 2019-05-14 VITALS — BP 144/74 | HR 84 | Temp 97.5°F | Resp 16 | Ht 65.0 in | Wt 219.8 lb

## 2019-05-14 DIAGNOSIS — E890 Postprocedural hypothyroidism: Secondary | ICD-10-CM | POA: Insufficient documentation

## 2019-05-14 DIAGNOSIS — Z09 Encounter for follow-up examination after completed treatment for conditions other than malignant neoplasm: Secondary | ICD-10-CM | POA: Insufficient documentation

## 2019-05-14 DIAGNOSIS — Z87891 Personal history of nicotine dependence: Secondary | ICD-10-CM | POA: Insufficient documentation

## 2019-05-14 DIAGNOSIS — B999 Unspecified infectious disease: Secondary | ICD-10-CM

## 2019-05-14 NOTE — Progress Notes (Signed)
ENT Clinic, [place]  [place] [address]  [phone]    Patient Name: [name]   MRN:  [id number]   DOB: 09-03-1956   Date of Service:  05/14/2019     ENT HEAD AND NECK ONCOLOGY SURGERY AND RECONSTRUCTION     FOLLOW UP NOTE     HPI: This is a 63 y.o. female who presents for follow-up. The patient underwent a hemithyroidectomy 04/06/2019. Her post-operative course was complicated by a post-operative infection that was initially treated with Augmentin and improved. She later returned with a fluid collection and purulence expressed from the incision. The patient was sent home on antibiotics and wound packing instructions.     She comes in today for follow-up. She is doing well and has noticed that the wound has closed significantly.     Past Medical History:  Past Medical History:   Diagnosis Date   . Anxiety    . Arthritis    . Cancer (CMS HCC)     internal skin cancer   . Depression    . Dyspnea on exertion    . Essential hypertension 02/16/2017    patient denies   . GERD (gastroesophageal reflux disease)     controlled w/med   . Headache    . Heart murmur 1979    benign   . Hyperlipidemia LDL goal < 100 08/29/2013   . MINOR CAD (coronary artery disease) 11/10/2012    no treatment other than cholesterol medications; follows regularly with Dr. Rodney Booze   . Multinodular goiter    . Muscle weakness     neck    . Obesity    . Peripheral edema    . Rash     face only eczema   . Rheumatic fever 16    no complications   . Squamous cell carcinoma 02/06/2015    right side of neck   . 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 Vaginal prolapse 2012    surgery improved significantly   . Wears glasses            Past Surgical History:  Past Surgical History:   Procedure Laterality Date   . COLONOSCOPY  01/06/09    COLONOSCOPY performed by Loletta Parish F at [place]   . GASTROSCOPY  03/05/2010    GASTROSCOPY performed by Jocelyn Lamer at [place]   . GASTROSCOPY  WITH BIOPSY  03/05/2010    GASTROSCOPY WITH BIOPSY performed by Jocelyn Lamer at [place]   . HX ADENOIDECTOMY     . HX ANKLE FRACTURE [place]  2007    left distal fibula, casted   . HX APPENDECTOMY     . HX CHOLECYSTECTOMY     . HX COLONOSCOPY     . HX CYSTOCELE REPAIR  09/24/09   . HX HAND SURGERY  2012    for Carpal tunnel    . HX OOPHORECTOMY      left ovary removed   . HX PARTIAL THYROIDECTOMY Right 04/05/2019    R hemithyroidectomy for nodule, final path follicular adenoma   . HX TONSILLECTOMY     . HX TOTAL VAGINAL HYSTERECTOMY  1979   . HX WRIST FRACTURE [facility]  2007    [place] injury   . PB REVISE ULNAR NERVE AT ELBOW Left 1979   . PB UPPER GI ENDOSCOPY,BIOPSY  12/19/07    patulous GE junction zone, erythema, nonerosive GERD  Family History: Patient's family history was reviewed in writing and with the patient with findings as noted below. Unless otherwise noted, patient has no family history of similar problems.   Family Medical History:     Problem Relation (Age of Onset)    Bipolar Disorder Daughter    Breast Cancer Paternal Aunt, Paternal Aunt, Paternal Aunt, Paternal 44, Paternal 24, Other    Cancer Paternal Aunt, Paternal 41, Paternal 64, Paternal 59, Paternal 9, Other (66)    Congestive Heart Failure Father (72)    Coronary Artery Disease Father, Mother (67)    Diabetes Mother    Heart Attack Father, Sister    High Cholesterol Mother    Hypertension (High Blood Pressure) Mother, Brother, Sister, Sister    Kidney Disease Sister    Leukemia Paternal Uncle, Paternal Uncle    Stroke Paternal Grandmother    Thyroid Disease Sister               MEDICATIONS:    Current Outpatient Medications:   .  aspirin (ECOTRIN) 81 mg Oral Tablet, Delayed Release (E.C.), Take 81 mg by mouth Once a day, Disp: , Rfl:   .  Azelaic Acid 20 % Cream, Apply topically Twice daily (Patient taking differently: Apply topically Twice per day as needed ), Disp: 50 g, Rfl: 0  .  cholecalciferol, vitamin D3, (VITAMIN D) 25 mcg  (1,000 unit) Oral Tablet, Take 1 Tab (1,000 Units total) by mouth Once a day, Disp: 30 Tab, Rfl: 3  .  Clobetasol (TEMOVATE) 0.05 % Ointment, Apply topically Twice daily To rash on hands and feet (Patient taking differently: Apply topically Twice per day as needed To rash on hands and feet), Disp: 60 g, Rfl: 0  .  clobetasoL (TEMOVATE) 0.05 % Ointment, Apply topically, Disp: , Rfl:   .  erythromycin (ROMYCIN) 5 mg/gram (0.5 %) Ophthalmic Ointment, Instill into right eye Twice daily, Disp: 1 g, Rfl: 0  .  FLUoxetine (SARAFEM) 20 mg Oral Tablet, Take 1 Tab (20 mg total) by mouth Twice daily, Disp: 60 Tab, Rfl: 5  .  fluticasone propionate (FLONASE) 50 mcg/actuation Nasal Spray, Suspension, 1 Spray by Each Nostril route Once a day, Disp: 16 g, Rfl: 3  .  gabapentin (NEURONTIN) 600 mg Oral Tablet, Take 1 Tab (600 mg total) by mouth Twice daily, Disp: 60 Tab, Rfl: 4  .  Halobetasol Propionate (ULTRAVATE) 0.05 % Cream, Apply  topically. Three times a week (Patient taking differently: Twice per day as needed Apply  topically. Three times a week), Disp: 15 g, Rfl: 2  .  Halobetasol Propionate (ULTRAVATE) 0.05 % Cream, Apply topically, Disp: , Rfl:   .  hydrOXYzine HCL (ATARAX) 50 mg Oral Tablet, TAKE 1 TABLET BY MOUTH EVERY NIGHT, Disp: 30 Tab, Rfl: 1  .  methocarbamoL (ROBAXIN) 500 mg Oral Tablet, Take 1 Tab (500 mg total) by mouth Four times a day For muscle spasms, Disp: 60 Tab, Rfl: 0  .  Metronidazole (METROCREAM) 0.75 % Cream, Apply topically Twice daily (Patient taking differently: Apply topically Twice per day as needed ), Disp: 45 g, Rfl: 1  .  Metronidazole (METROCREAM) 0.75 % Cream, Apply topically, Disp: , Rfl:   .  mineral oil-hydrophil petrolat (AQUAPHOR) Ointment, Apply topically, Disp: , Rfl:   .  montelukast (SINGULAIR) 10 mg Oral Tablet, Take 1 Tab (10 mg total) by mouth Every evening, Disp: 30 Tab, Rfl: 3  .  nitroGLYCERIN (NITROSTAT) 0.4 mg Sublingual Tablet, Sublingual, 1 Tab (  0.4 mg total) by  Sublingual route Every 5 minutes as needed for Chest pain for up to 3 doses for 3 doses over 15 minutes, Disp: 20 Tab, Rfl: 0  .  nystatin (NYSTOP) 100,000 unit/gram Powder, Apply once or twice daily to PREVENT rash under breasts, Disp: 60 g, Rfl: 11  .  nystatin (NYSTOP) 100,000 unit/gram Powder, Apply 1 g topically, Disp: , Rfl:   .  pantoprazole (PROTONIX) 40 mg Oral Tablet, Delayed Release (E.C.), Take 1 Tab (40 mg total) by mouth Every morning before breakfast, Disp: 30 Tab, Rfl: 5  .  tolterodine (DETROL) 2 mg Oral Tablet, Take 2 mg by mouth Once a day, Disp: , Rfl:   .  traZODone (DESYREL) 50 mg Oral Tablet, Take 1/2 to full tablet as needed for sleep., Disp: 30 Tab, Rfl: 3  .  triamcinolone acetonide (ARISTOCORT A) 0.1 % Ointment, by Apply Topically route Twice daily (Patient taking differently: by Apply Topically route Twice per day as needed ), Disp: 80 g, Rfl: 2  .  triamcinolone acetonide (ARISTOCORT A) 0.1 % Ointment, Apply topically, Disp: , Rfl:      ALLERGIES:  Allergy History as of 05/14/19     CODEINE       Noted Status Severity Type Reaction    12/10/15 0941 Pantojas, [place]  Active Low  Itching    Comments:  RASH     09/19/13 1430 Wallace Cullens Joy  Deleted Low  Itching    Comments:  RASH     09/04/09 Ginette Pitman  Active   Itching    Comments:  RASH     10/10/07   Active       Comments:  RASH           STRAWBERRY       Noted Status Severity Type Reaction    03/18/11 1634 Johnnette Barrios  Deleted       10/10/08 [name], MD  Deleted       10/10/07   Active             MELOXICAM       Noted Status Severity Type Reaction    11/24/18 0857 Kisner, Bishop Limbo, RN 06/26/09 Active Low  Nausea/ Vomiting    Comments:  Nervous and shakes      12/10/15 0941 Pantojas, Kristy 06/26/09 Active Low  Nausea/ Vomiting    06/26/09 Corky Sing Nacole 06/26/09 Active   Nausea/ Vomiting          NICKEL       Noted Status Severity Type Reaction    11/24/18 0856 Kisner, Bishop Limbo, RN 07/24/12  Active  Topical     Comments:  Blisters       07/24/12 1041 [name], MD 07/24/12 Active  Topical           BLUE DYE       Noted Status Severity Type Reaction    07/24/12 1041 [name], MD 07/24/12 Active  Topical     Comments:  Told to avoid due to patch test result by Derm Almyra           DULOXETINE       Noted Status Severity Type Reaction    07/24/12 1052 [name], MD 07/24/12 Active Medium Systemic     Comments:  Rash on feet and hands           NAPROXEN       Noted Status Severity  Type Reaction    11/01/12 0913 Sanjuana Letters, MA 11/01/12 Active Low  Nausea/ Vomiting          ATORVASTATIN       Noted Status Severity Type Reaction    06/19/18 2149 Erskine Squibb, RX STUDENT 01/27/15 Active  Side Effect Myalgia    Comments:  Muscle pain     01/27/15 1035 [name], MD 01/27/15 Active  Side Effect     Comments:  Muscle pain           FLAVORING AGENT       Noted Status Severity Type Reaction    04/03/18 1905 Suella Grove, MA 05/03/17 Active             NSAIDS (NON-STEROIDAL ANTI-INFLAMMATORY DRUG)       Noted Status Severity Type Reaction    11/24/18 0857 Kisner, Bishop Limbo, RN 05/03/17 Active Low  Nausea/ Vomiting    04/03/18 1905 Suella Grove, MA 05/03/17 Active                    SOCIAL HISTORY:  Social History     Socioeconomic History   . Marital status: Divorced     Spouse name: Not on file   . Number of children: Not on file   . Years of education: Not on file   . Highest education level: Not on file   Occupational History   . 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 Financial resource strain: Not on file   . Food insecurity     Worry: Not on file     Inability: Not on file   . Transportation needs     Medical: Not on file     Non-medical: Not on file   Tobacco Use   . Smoking status: Former Smoker     Packs/day: 2.00     Years: 40.00     Pack years: 80.00     Types: Cigarettes     Last  attempt to quit: 01/22/2005     Years since quitting: 14.3   . Smokeless tobacco: Never Used   Substance and Sexual Activity   . Alcohol use: No     Alcohol/week: 0.0 standard drinks   . Drug use: No   . Sexual activity: Yes     Partners: Male   Lifestyle   . Physical activity     Days per week: Not on file     Minutes per session: Not on file   . Stress: Not on file   Relationships   . Social Product manager on phone: Not on file     Gets together: Not on file     Attends religious service: Not on file     Active member of club or organization: Not on file     Attends meetings of clubs or organizations: Not on file     Relationship status: Not on file   . Intimate partner violence     Fear of current or ex partner: Not on file     Emotionally abused: Not on file     Physically abused: Not on file     Forced sexual activity: Not on file   Other Topics Concern   . Abuse/Domestic Violence Not Asked   . Breast Self Exam Not Asked   . Caffeine Concern Not Asked   .  Calcium intake adequate Not Asked   . Computer Use Not Asked   . Exercise Concern Not Asked   . Helmet Use Not Asked   . Seat Belt Not Asked   . Special Diet Not Asked   . Sunscreen used Not Asked   . Uses Cane No   . Uses walker No   . Uses wheelchair No   . Right hand dominant Yes   . Left hand dominant No   . Ambidextrous No   . Ability to Walk 1 Flight of Steps without SOB/CP Yes   . Routine Exercise Yes     Comment: walk   . Ability to Walk 2 Flight of Steps without SOB/CP No     Comment: shortness of breath, no cp    . Unable to Ambulate Not Asked   . Total Care Not Asked   . Ability To Do Own ADL's Yes   . Uses Walker Not Asked   . Other Activity Level Not Asked   . Uses Cane Not Asked   . Drives Not Asked   . Shift Work No   . Unusual Sleep-Wake Schedule No   Social History Narrative   . Not on file         PHYSICAL EXAM:  BP (!) 144/74   Pulse 84   Temp 36.4 C (97.5 F)   Resp 16   Ht 1.651 m (5\' 5" )   Wt 99.7 kg (219 lb 12.8 oz)   BMI  36.58 kg/m       Body mass index is 36.58 kg/m.  General Appearance: Pleasant, cooperative, healthy, and in no acute distress.  Eyes: Conjunctivae/corneas clear, EOM's intact.  Head and Face: Normocephalic, atraumatic.  Face symmetric, no obvious lesions.   Ears: Normal shape and position.   Nose:  External pyramid midline. Septum midline.   Neck: Left neck with minimal packing. No tunneling at packing site. Firm tissue around incision consistent with scar. No fluctuance.    Cardiovascular:  Good perfusion of upper extremities.  No cyanosis of the hands or fingers.  Lungs: No apparent stridorous breathing. No acute distress.  Neurologic: Cranial nerves: grossly intact.  Psychiatric:  Alert and oriented x 3.    LABS:  Status:  Preliminary result Visible to patient:  No (Blocked) Next appt:  Today at 09:45 AM in Otolaryngology Posey Pronto, Dudley Major, MD) Dx:  Neck abscess   Specimen Information: Abcess; Other       FLC 1+ Rare Staphylococcus epidermidis, Coagulase negative StaphylococcusAbnormal              GRAM STAIN  2+ Few PMNs      No Organisms Seen                  Specimen Collected: 05/03/19 12:57 Last Resulted: 05/06/19 03:51                 ASSESSMENT/PLAN: Patient comes in for follow-up for right hemithyroidectomy and post-operative wound infection. This appears to have healed completely. Will see back in 1 month for follow-up, sooner if the patient notices any new symptoms or concerns.     Delfino Lovett, MD

## 2019-05-15 LAB — STERILE SITE CULTURE AND GRAM STAIN, AEROBIC: GRAM STAIN: NONE SEEN

## 2019-05-23 ENCOUNTER — Other Ambulatory Visit (HOSPITAL_COMMUNITY): Payer: Self-pay | Admitting: Psychiatry

## 2019-05-29 ENCOUNTER — Inpatient Hospital Stay (HOSPITAL_COMMUNITY)
Admission: RE | Admit: 2019-05-29 | Discharge: 2019-05-29 | Disposition: A | Discharge status: Home / Self Care | Payer: MEDICAID | Source: Ambulatory Visit

## 2019-05-29 ENCOUNTER — Other Ambulatory Visit: Payer: Self-pay

## 2019-05-29 ENCOUNTER — Encounter (HOSPITAL_COMMUNITY): Payer: Self-pay

## 2019-05-29 HISTORY — DX: Other chronic pain: G89.29

## 2019-06-04 ENCOUNTER — Ambulatory Visit (HOSPITAL_COMMUNITY): Payer: MEDICAID

## 2019-06-04 ENCOUNTER — Ambulatory Visit: Payer: MEDICAID

## 2019-06-04 ENCOUNTER — Ambulatory Visit (HOSPITAL_BASED_OUTPATIENT_CLINIC_OR_DEPARTMENT_OTHER): Payer: Self-pay | Admitting: Family Medicine

## 2019-06-04 ENCOUNTER — Other Ambulatory Visit: Payer: Self-pay

## 2019-06-04 DIAGNOSIS — R05 Cough: Secondary | ICD-10-CM | POA: Insufficient documentation

## 2019-06-04 DIAGNOSIS — Z20828 Contact with and (suspected) exposure to other viral communicable diseases: Secondary | ICD-10-CM | POA: Insufficient documentation

## 2019-06-04 DIAGNOSIS — R059 Cough, unspecified: Secondary | ICD-10-CM

## 2019-06-04 DIAGNOSIS — J3089 Other allergic rhinitis: Secondary | ICD-10-CM

## 2019-06-04 MED ORDER — IPRATROPIUM 0.5 MG-ALBUTEROL 3 MG (2.5 MG BASE)/3 ML NEBULIZATION SOLN
3.00 mL | INHALATION_SOLUTION | Freq: Four times a day (QID) | RESPIRATORY_TRACT | 2 refills | Status: AC
Start: 2019-06-04 — End: ?

## 2019-06-04 MED ORDER — LORATADINE 5 MG-PSEUDOEPHEDRINE ER 120 MG TABLET,EXTENDED RELEASE,12HR
1.00 | ORAL_TABLET | Freq: Two times a day (BID) | ORAL | 1 refills | Status: DC
Start: 2019-06-04 — End: 2020-04-30

## 2019-06-04 NOTE — Telephone Encounter (Signed)
Returned call to patient at this time. She is agreeable to video visit. Apt scheduled.     Lemar Livings, RN  06/04/2019, 10:31

## 2019-06-04 NOTE — Telephone Encounter (Signed)
Regarding: Strick Pt \\ covid test order request  ----- Message from [place] sent at 06/04/2019  9:47 AM EDT -----  Dennison Mascot, MD    Pt calling in stating that she was just told yesterday that she was around someone on the 2nd of this month who tested positive for covid. Pt states that she was wearing a mask at the time, but since then she has developed a dry cough. She denies any fever or SOB, and states she has been taking her temperature frequently. She would like to have an order for testing put in place.    Thanks,  [employer]

## 2019-06-04 NOTE — Progress Notes (Signed)
FAMILY [facility]ICINE, [place] [address]  Operated by [facility]  Video Visit     Name: [name]  MRN: [id number]    Date: 06/04/2019  Age: 63 y.o.                             Patient's location: [place] [address]   Patient/family aware of provider location: Yes  Patient/family consent for video visit: Yes  Interview and observation performed by: Traci Sermon, DO    Chief Complaint: Cough    History of Present Illness:  [name] is a 63 y.o. female calling with concern for COVID exposure and symptoms.   - exposed last Sunday at church (8 days ago)  - developed cough and body aches two days ago  - afebrile  - denies continued contact with others, except for her grandson, who has not been ill  - quit smoking 13 years ago  - no sputum production and no underlying pulmonary disease  - does have seasonal allergies; having PND and feels as though there is something stuck in her throat  - also reporting two days of abdominal pain; denies diarrhea and vomiting     Past Medical History:  She has a past medical history of Anxiety, Arthritis, Cancer (CMS [place]), Chronic pain, Depression, Dyspnea on exertion, Essential hypertension (02/16/2017), GERD (gastroesophageal reflux disease), Headache, Heart murmur (1979), Hyperlipidemia LDL goal < 100 (08/29/2013), MINOR CAD (coronary artery disease) (11/10/2012), Multinodular goiter, Muscle weakness, Obesity, Peripheral edema, Rash, Rheumatic fever (16), Squamous cell carcinoma (06/22/9370), 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 She also has no past medical history of Allergic rhinitis, Anemia, Anesthesia complication, Angina at rest (CMS [facility]), Angina pectoris (CMS [facility]), Aortic aneurysm (CMS HCC), Asthma, Asthma, Automatic implantable cardioverter-defibrillator in situ, Awareness under anesthesia, Back problem, Blood transfusion, without  reported diagnosis, Breast CA (CMS HCC), Bruises easily, Cataract, Cervical cancer (CMS HCC), Chronic bronchitis (CMS HCC), Chronic obstructive airway disease (CMS HCC), Clotting disorder (CMS HCC), Cognitive dysfunction, Colon cancer (CMS HCC), Congenital anomaly of heart, Congestive heart failure, unspecified, COPD (chronic obstructive pulmonary disease) (CMS [place]), CVA (cerebrovascular accident) (CMS [place]), Deep vein thrombosis (DVT) (CMS [place]), Diabetes, Diabetes mellitus (CMS [place]), Diabetes mellitus type II, Diabetes mellitus, type 1, Diabetes mellitus, type 2 (CMS [place]), Difficult intubation, Difficult intubation, Difficulty waking, Disorder of liver, Dysrhythmias, Endometrial cancer (CMS HCC), Glaucoma, H/O hearing loss, H/O urinary tract infection, Hard to intubate, Hearing loss, Hepatic cirrhosis (CMS HCC), Hepatitis A virus infection, HTN, breast cancer, coronary artery bypass graft, transfusion, Kidney disease, Loss of weight, Malignant hyperthermia, MI (myocardial infarction) (CMS HCC), Myocardial infarction (CMS HCC), Nausea with vomiting, Neck problem, Other convulsions, Ovarian cancer (CMS [place]), Pacemaker, Palpitations, Panic attack, Personal history of irradiation, Personal history of malignant neoplasm of prostate, PONV (postoperative nausea and vomiting), PONV (postoperative nausea and vomiting), Problems with swallowing, Pseudocholinesterase deficiency, Pulmonary embolism (CMS HCC), Pulmonary emphysema (CMS HCC), Renal failure, Seizure (CMS HCC), Seizures, Shortness of breath, Sleep apnea, Spinal headache, STD (sexually transmitted disease), Stented coronary artery, Stroke (CMS HCC), Thyroid disease, Thyroid disorder, Treatment, Type 2 diabetes mellitus, Type 2 diabetes mellitus (CMS [place]), Type I diabetes mellitus (CMS [place]), Upper respiratory infection, Uterine cancer (CMS [place]), Valvular disease, Viral hepatitis B, or Viral hepatitis C.    Past Surgical History:  She has a past surgical history that  includes pb upper gi endoscopy,biopsy (12/19/07); hx ankle fracture tx (2007); hx wrist fracture tx (2007); hx total vaginal hysterectomy (1979); colonoscopy (01/06/09); hx cystocele repair (09/24/09); hx gall bladder surgery/chole; hx adenoidectomy; hx tonsillectomy; gastroscopy (03/05/2010); gastroscopy with biopsy (03/05/2010); pb revise ulnar nerve at elbow (Left, 1979); hx appendectomy; hx colonoscopy; hx hand surgery (2012); hx oophorectomy; hx partial thyroidectomy (Right, 04/05/2019); and hx heart catheterization.    Problem List:  She has Migraine Headaches; GERD not well controlled; Major depressive disorder, recurrent episode (CMS HCC); Patellofemoral pain syndrome; Multinodular goiter; Microscopic hematuria; Neck pain; Well woman exam; Lichen sclerosus et atrophicus of the vulva; Carpal tunnel syndrome on right; Osteoarthritis, knee; Left knee pain; Chest pain at rest; MINOR CAD (coronary artery disease); Hyperlipidemia LDL goal < 100; Otitis media of both ears; Mass of parotid gland, right; Squamous cell carcinoma; Chest pain; Dysuria; Urinary frequency; Multiple pulmonary nodules determined by computed tomography of lung; History of rheumatic fever; Neuropathy (CMS HCC); PTSD (post-traumatic stress disorder); Recurrent major depressive disorder, in partial remission (CMS HCC); Essential hypertension; Acute suppurative otitis media without spontaneous rupture of ear drum; 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    Medications:  .  aspirin  .  Azelaic Acid  .  cholecalciferol (vitamin D3)  .  clobetasoL  .  clobetasoL  .  erythromycin  .  FLUoxetine  .  fluticasone propionate  .  gabapentin  .  Halobetasol Propionate  .  Halobetasol Propionate  .  hydrOXYzine HCL  .  ipratropium-albuteroL  .  loratadine-pseudoephedrine  .  methocarbamoL  .  Metronidazole  .  Metronidazole  .   Aquaphor  .  montelukast  .  nitroGLYCERIN  .  nystatin  .  nystatin  .  pantoprazole  .  tolterodine  .  traZODone  .  triamcinolone acetonide  .  triamcinolone acetonide     Review of Systems:  - denies headaches, neurologic changes, visual changes  - reports chronic MSK pain; no recent injuries, trauma, or falls  - denies new environmental changes or exposures in home  - denies chest pain, wheezing, SOB, and dizziness  - no sputum production  - no trouble swallowing     Observational Exam:   - patient is ill appearing  - has loud barking cough  - no signs of respiratory distress  - moving all extremities, no obvious neurologic deficits     Data Reviewed:   - patient's med list  - patient's problem list  - recent cultures of neck wound    Assessment/Plan:      ICD-10-CM    1. Cough R05 COVID-19 SCREENING - SEND-OUT     DME - NEBULIZER SUPPLIES AND KIT   2. Environmental and seasonal allergies J30.89      - patient to go to [place] at [facility] to be swabbed; counseled on quarantine, contacts, mask, and goggles at all times in public  - possible allergic component given PND and abdominal pain, in setting of excess drainage  - start Claritin-D qMorning  - duonebs QID prn  - DME order for nebulizer kit to be printed at [facility] for pick up      Orders Placed This Encounter   . COVID-19 SCREENING - SEND-OUT   . ipratropium-albuterol 0.5 mg-3 mg(2.5 mg base)/3 mL Solution for Nebulization   . loratadine-pseudoephedrine (CLARITIN-D) 5-120 mg  Oral Tablet Sustained Release 12 hr   . DME - NEBULIZER SUPPLIES AND KIT     Follow Up:  prn    Traci Sermon, DO  06/04/2019, 11:43    I have discussed the patient's case with the resident and I agree with the assessment and plan.    Yetta Glassman. Alton Revere, MD

## 2019-06-07 LAB — COVID-19 SCREENING - SEND-OUT: SARS-COV-2 RNA: NOT DETECTED

## 2019-06-12 MED ADMIN — DETOX STANDARD: INTRAVENOUS | @ 21:00:00 | NDC 00338004904

## 2019-06-15 ENCOUNTER — Ambulatory Visit: Payer: MEDICAID | Attending: Otolaryngology | Admitting: Otolaryngology

## 2019-06-15 ENCOUNTER — Encounter (HOSPITAL_BASED_OUTPATIENT_CLINIC_OR_DEPARTMENT_OTHER): Payer: Self-pay | Admitting: Otolaryngology

## 2019-06-15 ENCOUNTER — Other Ambulatory Visit: Payer: Self-pay

## 2019-06-15 VITALS — BP 158/99 | HR 92 | Temp 97.3°F | Resp 18 | Ht 65.0 in | Wt 220.9 lb

## 2019-06-15 DIAGNOSIS — Z9889 Other specified postprocedural states: Secondary | ICD-10-CM | POA: Insufficient documentation

## 2019-06-15 DIAGNOSIS — E890 Postprocedural hypothyroidism: Secondary | ICD-10-CM | POA: Insufficient documentation

## 2019-06-15 DIAGNOSIS — E041 Nontoxic single thyroid nodule: Secondary | ICD-10-CM | POA: Insufficient documentation

## 2019-06-15 NOTE — Progress Notes (Signed)
[facility], [place]  [place] [id number]  [phone]    Patient Name: [name]   MRN:  [id number]   DOB: 05-Oct-1956   Date of Service:  06/15/2019     ENT HEAD AND NECK ONCOLOGY SURGERY AND RECONSTRUCTION     FOLLOW UP NOTE     HPI: This is a 63 y.o. female who presents for follow-up. The patient underwent a hemithyroidectomy 04/06/2019. Her post-operative course was complicated by a post-operative infection that was initially treated with Augmentin and improved. She later returned with a fluid collection and purulence expressed from the incision. The patient was sent home on antibiotics and wound packing instructions.     She comes in today for follow-up. She has healed up completely. No new issues or concerns. She is on albuterol right now for what sounds like a sinus flare.     Past Medical History:  Past Medical History:   Diagnosis Date   . Anxiety    . Arthritis    . Cancer (CMS HCC)     internal skin cancer   . Chronic pain     lower back   . Depression    . Dyspnea on exertion    . Essential hypertension 02/16/2017    patient denies   . GERD (gastroesophageal reflux disease)     controlled w/med   . Headache    . Heart murmur 1979    benign   . Hyperlipidemia LDL goal < 100 08/29/2013    denies   . MINOR CAD (coronary artery disease) 11/10/2012    no treatment other than cholesterol medications; follows regularly with Dr. Rodney Booze   . Multinodular goiter    . Muscle weakness     neck    . Obesity    . Peripheral edema    . Rash     face only eczema   . Rheumatic fever 16    no complications   . Squamous cell carcinoma 02/06/2015    right side of neck   . 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 Vaginal prolapse 2012    surgery improved significantly   . Wears glasses            Past Surgical History:  Past Surgical History:   Procedure Laterality Date   . COLONOSCOPY  01/06/09    COLONOSCOPY performed by Loletta Parish F at [place]   .  GASTROSCOPY  03/05/2010    GASTROSCOPY performed by Jocelyn Lamer at [place]   . GASTROSCOPY WITH BIOPSY  03/05/2010    GASTROSCOPY WITH BIOPSY performed by Jocelyn Lamer at [place]   . HX ADENOIDECTOMY     . HX ANKLE FRACTURE TX  2007    left distal fibula, casted   . HX APPENDECTOMY     . HX CHOLECYSTECTOMY     . HX COLONOSCOPY     . HX CYSTOCELE REPAIR  09/24/09   . HX HAND SURGERY  2012    for Carpal tunnel    . HX HEART CATHETERIZATION     . HX OOPHORECTOMY      left ovary removed   . HX PARTIAL THYROIDECTOMY Right 04/05/2019    R hemithyroidectomy for nodule, final path follicular adenoma   . HX TONSILLECTOMY     . HX TOTAL VAGINAL HYSTERECTOMY  1979   . HX WRIST FRACTURE TX  2007  [place] injury   . PB REVISE ULNAR NERVE AT ELBOW Left 1979   . PB UPPER GI ENDOSCOPY,BIOPSY  12/19/07    patulous GE junction zone, erythema, nonerosive GERD           Family History: Patient's family history was reviewed in writing and with the patient with findings as noted below. Unless otherwise noted, patient has no family history of similar problems.   Family Medical History:     Problem Relation (Age of Onset)    Bipolar Disorder Daughter    Breast Cancer Paternal Aunt, Paternal Aunt, Paternal Aunt, Paternal 30, Paternal 57, Other    Cancer Paternal Aunt, Paternal 62, Paternal 52, Paternal 68, Paternal 47, Other (54)    Congestive Heart Failure Father (39)    Coronary Artery Disease Father, Mother (24)    Diabetes Mother    Heart Attack Father, Sister    High Cholesterol Mother    Hypertension (High Blood Pressure) Mother, Brother, Sister, Sister    Kidney Disease Sister    Leukemia Paternal Uncle, Paternal Uncle    Stroke Paternal Grandmother    Thyroid Disease Sister               MEDICATIONS:    Current Outpatient Medications:   .  aspirin (ECOTRIN) 81 mg Oral Tablet, Delayed Release (E.C.), Take 81 mg by mouth Once a day, Disp: , Rfl:   .  Azelaic Acid 20 % Cream, Apply topically Twice daily (Patient taking  differently: Apply topically Twice per day as needed ), Disp: 50 g, Rfl: 0  .  cholecalciferol, vitamin D3, (VITAMIN D) 25 mcg (1,000 unit) Oral Tablet, Take 1 Tab (1,000 Units total) by mouth Once a day, Disp: 30 Tab, Rfl: 3  .  Clobetasol (TEMOVATE) 0.05 % Ointment, Apply topically Twice daily To rash on hands and feet (Patient taking differently: Apply topically Twice per day as needed To rash on hands and feet), Disp: 60 g, Rfl: 0  .  clobetasoL (TEMOVATE) 0.05 % Ointment, Apply topically, Disp: , Rfl:   .  erythromycin (ROMYCIN) 5 mg/gram (0.5 %) Ophthalmic Ointment, Instill into right eye Twice daily, Disp: 1 g, Rfl: 0  .  FLUoxetine (SARAFEM) 20 mg Oral Tablet, Take 1 Tab (20 mg total) by mouth Twice daily, Disp: 60 Tab, Rfl: 5  .  fluticasone propionate (FLONASE) 50 mcg/actuation Nasal Spray, Suspension, 1 Spray by Each Nostril route Once a day, Disp: 16 g, Rfl: 3  .  gabapentin (NEURONTIN) 600 mg Oral Tablet, Take 1 Tab (600 mg total) by mouth Twice daily, Disp: 60 Tab, Rfl: 4  .  Halobetasol Propionate (ULTRAVATE) 0.05 % Cream, Apply  topically. Three times a week (Patient taking differently: Twice per day as needed Apply  topically. Three times a week), Disp: 15 g, Rfl: 2  .  Halobetasol Propionate (ULTRAVATE) 0.05 % Cream, Apply topically, Disp: , Rfl:   .  hydrOXYzine HCL (ATARAX) 50 mg Oral Tablet, TAKE 1 TABLET BY MOUTH EVERY DAY AT NIGHT, Disp: 30 Tab, Rfl: 1  .  ipratropium-albuterol 0.5 mg-3 mg(2.5 mg base)/3 mL Solution for Nebulization, 3 mL by Nebulization route Four times a day, Disp: 3 mL, Rfl: 2  .  loratadine-pseudoephedrine (CLARITIN-D) 5-120 mg Oral Tablet Sustained Release 12 hr, Take 1 Tab by mouth Twice daily, Disp: 30 Tab, Rfl: 1  .  methocarbamoL (ROBAXIN) 500 mg Oral Tablet, Take 1 Tab (500 mg total) by mouth Four times a day For muscle spasms, Disp:  60 Tab, Rfl: 0  .  Metronidazole (METROCREAM) 0.75 % Cream, Apply topically Twice daily (Patient taking differently: Apply topically  Twice per day as needed ), Disp: 45 g, Rfl: 1  .  Metronidazole (METROCREAM) 0.75 % Cream, Apply topically, Disp: , Rfl:   .  mineral oil-hydrophil petrolat (AQUAPHOR) Ointment, Apply topically, Disp: , Rfl:   .  montelukast (SINGULAIR) 10 mg Oral Tablet, Take 1 Tab (10 mg total) by mouth Every evening, Disp: 30 Tab, Rfl: 3  .  nitroGLYCERIN (NITROSTAT) 0.4 mg Sublingual Tablet, Sublingual, 1 Tab (0.4 mg total) by Sublingual route Every 5 minutes as needed for Chest pain for up to 3 doses for 3 doses over 15 minutes, Disp: 20 Tab, Rfl: 0  .  nystatin (NYSTOP) 100,000 unit/gram Powder, Apply once or twice daily to PREVENT rash under breasts, Disp: 60 g, Rfl: 11  .  nystatin (NYSTOP) 100,000 unit/gram Powder, Apply 1 g topically, Disp: , Rfl:   .  pantoprazole (PROTONIX) 40 mg Oral Tablet, Delayed Release (E.C.), Take 1 Tab (40 mg total) by mouth Every morning before breakfast, Disp: 30 Tab, Rfl: 5  .  tolterodine (DETROL) 2 mg Oral Tablet, Take 2 mg by mouth Once a day, Disp: , Rfl:   .  traZODone (DESYREL) 50 mg Oral Tablet, Take 1/2 to full tablet as needed for sleep., Disp: 30 Tab, Rfl: 3  .  triamcinolone acetonide (ARISTOCORT A) 0.1 % Ointment, by Apply Topically route Twice daily (Patient taking differently: by Apply Topically route Twice per day as needed ), Disp: 80 g, Rfl: 2  .  triamcinolone acetonide (ARISTOCORT A) 0.1 % Ointment, Apply topically, Disp: , Rfl:      ALLERGIES:  Allergy History as of 06/15/19     CODEINE       Noted Status Severity Type Reaction    12/10/15 0941 Pantojas, [place]  Active Low  Itching    Comments:  RASH     09/19/13 1430 Wallace Cullens Joy  Deleted Low  Itching    Comments:  RASH     09/04/09 Ginette Pitman  Active   Itching    Comments:  RASH     10/10/07   Active       Comments:  RASH           STRAWBERRY       Noted Status Severity Type Reaction    03/18/11 1634 Johnnette Barrios  Deleted       10/10/08 [name], MD  Deleted       10/10/07   Active              MELOXICAM       Noted Status Severity Type Reaction    11/24/18 0857 Kisner, Bishop Limbo, RN 06/26/09 Active Low  Nausea/ Vomiting    Comments:  Nervous and shakes      12/10/15 0941 Pantojas, Kristy 06/26/09 Active Low  Nausea/ Vomiting    06/26/09 Corky Sing N[place]ole 06/26/09 Active   Nausea/ Vomiting          NICKEL       Noted Status Severity Type Reaction    11/24/18 0856 Kisner, Bishop Limbo, RN 07/24/12 Active  Topical     Comments:  Blisters       07/24/12 1041 [name], MD 07/24/12 Active  Topical           BLUE DYE       Noted Status Severity Type Reaction  07/24/12 1041 [name], MD 07/24/12 Active  Topical     Comments:  Told to avoid due to patch test result by [facility] [place]           DULOXETINE       Noted Status Severity Type Reaction    07/24/12 1052 [name], MD 07/24/12 Active Medium Systemic     Comments:  Rash on feet and hands           NAPROXEN       Noted Status Severity Type Reaction    11/01/12 0913 Sanjuana Letters, MA 11/01/12 Active Low  Nausea/ Vomiting          ATORVASTATIN       Noted Status Severity Type Reaction    06/19/18 2149 Erskine Squibb, RX STUDENT 01/27/15 Active  Side Effect Myalgia    Comments:  Muscle pain     01/27/15 1035 [name], MD 01/27/15 Active  Side Effect     Comments:  Muscle pain           FLAVORING AGENT       Noted Status Severity Type Reaction    04/03/18 1905 Suella Grove, MA 05/03/17 Active             NSAIDS (NON-STEROIDAL ANTI-INFLAMMATORY DRUG)       Noted Status Severity Type Reaction    11/24/18 0857 Kisner, Bishop Limbo, RN 05/03/17 Active Low  Nausea/ Vomiting    04/03/18 1905 Suella Grove, MA 05/03/17 Active                    SOCIAL HISTORY:  Social History     Socioeconomic History   . Marital status: Divorced     Spouse name: Not on file   . Number of children: Not on file   . Years of education: Not on file   . Highest education level: Not on file   Occupational History   . 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 Financial resource strain: Not on file   . Food insecurity     Worry: Not on file     Inability: Not on file   . Transportation needs     Medical: Not on file     Non-medical: Not on file   Tobacco Use   . Smoking status: Former Smoker     Packs/day: 2.00     Years: 40.00     Pack years: 80.00     Types: Cigarettes     Last attempt to quit: 01/22/2005     Years since quitting: 14.4   . Smokeless tobacco: Never Used   Substance and Sexual Activity   . Alcohol use: No     Alcohol/week: 0.0 standard drinks   . Drug use: No   . Sexual activity: Yes     Partners: Male   Lifestyle   . Physical activity     Days per week: Not on file     Minutes per session: Not on file   . Stress: Not on file   Relationships   . Social Product manager on phone: Not on file     Gets together: Not on file     Attends religious service: Not on file     Active member of club or organization: Not on file     Attends meetings  of clubs or organizations: Not on file     Relationship status: Not on file   . Intimate partner violence     Fear of current or ex partner: Not on file     Emotionally abused: Not on file     Physically abused: Not on file     Forced sexual activity: Not on file   Other Topics Concern   . Abuse/Domestic Violence Not Asked   . Breast Self Exam Not Asked   . Caffeine Concern Not Asked   . Calcium intake adequate Not Asked   . Computer Use Not Asked   . Exercise Concern Not Asked   . Helmet Use Not Asked   . Seat Belt Not Asked   . Special Diet Not Asked   . Sunscreen used Not Asked   . Uses Cane No   . Uses walker No   . Uses wheelchair No   . Right hand dominant Yes   . Left hand dominant No   . Ambidextrous No   . Ability to Walk 1 Flight of Steps without SOB/CP Yes   . Routine Exercise Yes     Comment: walk   . Ability to Walk 2 Flight of Steps without SOB/CP No     Comment: shortness of breath, no cp    . Unable to  Ambulate No   . Total Care No   . Ability To Do Own ADL's Yes   . Uses Walker No   . Other Activity Level Not Asked   . Uses Cane No   . Drives Not Asked   . Shift Work No   . Unusual Sleep-Wake Schedule No   Social History Narrative   . Not on file         PHYSICAL EXAM:  BP (!) 158/99   Pulse 92   Temp 36.3 C (97.3 F) (Thermal Scan)   Resp 18   Ht 1.651 m (5\' 5" )   Wt 100 kg (220 lb 14.4 oz)   BMI 36.76 kg/m       Body mass index is 36.76 kg/m.  General Appearance: Pleasant, cooperative, healthy, and in no acute distress.  Eyes: Conjunctivae/corneas clear, EOM's intact.  Head and Face: Normocephalic, atraumatic.  Face symmetric, no obvious lesions.   Ears: Normal shape and position.   Nose:  External pyramid midline. Septum midline.   Neck: Left neck incision well healed.   Cardiovascular:  Good perfusion of upper extremities.  No cyanosis of the hands or fingers.  Lungs: No apparent stridorous breathing. No acute distress.  Neurologic: Cranial nerves: grossly intact.  Psychiatric:  Alert and oriented x 3.      ASSESSMENT/PLAN: Patient comes in for follow-up for right hemithyroidectomy and post-operative wound infection. She has healed completely. Will see back in October for planned follow-up with ultrasound, sooner if having questions or concerns.     Delfino Lovett, MD

## 2019-06-25 ENCOUNTER — Encounter (HOSPITAL_BASED_OUTPATIENT_CLINIC_OR_DEPARTMENT_OTHER): Payer: Self-pay | Admitting: Otolaryngology

## 2019-06-28 ENCOUNTER — Ambulatory Visit (HOSPITAL_COMMUNITY): Payer: Self-pay | Admitting: Student in an Organized Health Care Education/Training Program

## 2019-06-28 DIAGNOSIS — F331 Major depressive disorder, recurrent, moderate: Secondary | ICD-10-CM

## 2019-06-28 MED ORDER — FLUOXETINE 20 MG TABLET
20.00 mg | ORAL_TABLET | Freq: Two times a day (BID) | ORAL | 1 refills | Status: DC
Start: 2019-06-28 — End: 2019-09-02

## 2019-06-28 NOTE — Telephone Encounter (Signed)
Regarding: rx refill request  ----- Message from Jackey Loge sent at 06/27/2019 10:28 AM EDT -----  Pt is requesting a refill on the following medication. Please call to advise.     Current Outpatient Medications:  Prozac 20mg  in morning and 20mg  in night.    Preferred Pharmacy     [id number] - [place], [place] - [address]    [address] [place] [address]    Phone: [phone] Fax: [phone]    Not a 24 hour pharmacy; exact hours not known.        Thanks!  Quillian Quince

## 2019-07-05 ENCOUNTER — Ambulatory Visit: Payer: MEDICAID | Attending: Family Medicine

## 2019-07-05 ENCOUNTER — Other Ambulatory Visit: Payer: Self-pay

## 2019-07-05 ENCOUNTER — Encounter (INDEPENDENT_AMBULATORY_CARE_PROVIDER_SITE_OTHER): Payer: Self-pay | Admitting: Neurology

## 2019-07-05 DIAGNOSIS — Z23 Encounter for immunization: Secondary | ICD-10-CM | POA: Insufficient documentation

## 2019-07-17 ENCOUNTER — Other Ambulatory Visit (HOSPITAL_COMMUNITY): Payer: Self-pay | Admitting: Psychiatry

## 2019-07-17 ENCOUNTER — Encounter (HOSPITAL_BASED_OUTPATIENT_CLINIC_OR_DEPARTMENT_OTHER): Payer: Self-pay | Admitting: Family Medicine

## 2019-07-19 ENCOUNTER — Other Ambulatory Visit: Payer: Self-pay

## 2019-07-19 ENCOUNTER — Encounter (HOSPITAL_COMMUNITY): Payer: Self-pay

## 2019-07-19 ENCOUNTER — Inpatient Hospital Stay (HOSPITAL_COMMUNITY)
Admission: RE | Admit: 2019-07-19 | Discharge: 2019-07-19 | Disposition: A | Discharge status: Home / Self Care | Payer: MEDICAID | Source: Ambulatory Visit

## 2019-07-23 ENCOUNTER — Encounter (HOSPITAL_COMMUNITY)
Admission: RE | Disposition: A | Discharge status: Home / Self Care | Payer: Self-pay | Source: Ambulatory Visit | Attending: Gastroenterology

## 2019-07-23 ENCOUNTER — Ambulatory Visit (HOSPITAL_COMMUNITY): Payer: MEDICAID | Admitting: Anesthesiology

## 2019-07-23 ENCOUNTER — Ambulatory Visit (HOSPITAL_BASED_OUTPATIENT_CLINIC_OR_DEPARTMENT_OTHER): Payer: MEDICAID | Admitting: Anesthesiology

## 2019-07-23 ENCOUNTER — Other Ambulatory Visit: Payer: Self-pay

## 2019-07-23 ENCOUNTER — Encounter (HOSPITAL_COMMUNITY): Payer: Self-pay | Admitting: Gastroenterology

## 2019-07-23 ENCOUNTER — Inpatient Hospital Stay
Admission: RE | Admit: 2019-07-23 | Discharge: 2019-07-23 | Disposition: A | Discharge status: Home / Self Care | Payer: MEDICAID | Source: Ambulatory Visit | Attending: Gastroenterology | Admitting: Gastroenterology

## 2019-07-23 DIAGNOSIS — K6289 Other specified diseases of anus and rectum: Secondary | ICD-10-CM

## 2019-07-23 DIAGNOSIS — Z1211 Encounter for screening for malignant neoplasm of colon: Secondary | ICD-10-CM

## 2019-07-23 DIAGNOSIS — D120 Benign neoplasm of cecum: Secondary | ICD-10-CM | POA: Insufficient documentation

## 2019-07-23 DIAGNOSIS — D123 Benign neoplasm of transverse colon: Secondary | ICD-10-CM

## 2019-07-23 DIAGNOSIS — K621 Rectal polyp: Secondary | ICD-10-CM

## 2019-07-23 DIAGNOSIS — K635 Polyp of colon: Secondary | ICD-10-CM

## 2019-07-23 DIAGNOSIS — Z0289 Encounter for other administrative examinations: Secondary | ICD-10-CM

## 2019-07-23 DIAGNOSIS — B3789 Other sites of candidiasis: Secondary | ICD-10-CM | POA: Insufficient documentation

## 2019-07-23 SURGERY — COLONOSCOPY
Anesthesia: Monitor Anesthesia Care | Site: Anus | Wound class: Clean Contaminated Wounds-The respiratory, GI, Genital, or urinary

## 2019-07-23 MED ORDER — SODIUM CHLORIDE 0.9 % (FLUSH) INJECTION SYRINGE
2.0000 mL | INJECTION | INTRAMUSCULAR | Status: DC | PRN
Start: 2019-07-23 — End: 2019-07-23

## 2019-07-23 MED ORDER — SODIUM CHLORIDE 0.9 % (FLUSH) INJECTION SYRINGE
2.0000 mL | INJECTION | Freq: Three times a day (TID) | INTRAMUSCULAR | Status: DC
Start: 2019-07-23 — End: 2019-07-23
  Administered 2019-07-23 (×2)

## 2019-07-23 MED ORDER — SODIUM CHLORIDE 0.9 % (FLUSH) INJECTION SYRINGE
2.0000 mL | INJECTION | Freq: Three times a day (TID) | INTRAMUSCULAR | Status: DC
Start: 2019-07-23 — End: 2019-07-23
  Administered 2019-07-23 (×2)

## 2019-07-23 MED ORDER — LIDOCAINE (PF) 100 MG/5 ML (2 %) INTRAVENOUS SYRINGE
INJECTION | Freq: Once | INTRAVENOUS | Status: DC | PRN
Start: 2019-07-23 — End: 2019-07-23
  Administered 2019-07-23: 100 mg via INTRAVENOUS

## 2019-07-23 MED ORDER — PROPOFOL 10 MG/ML IV BOLUS
INJECTION | Freq: Once | INTRAVENOUS | Status: DC | PRN
Start: 2019-07-23 — End: 2019-07-23
  Administered 2019-07-23: 20 mg via INTRAVENOUS

## 2019-07-23 MED ORDER — FENTANYL (PF) 50 MCG/ML INJECTION SOLUTION
25.0000 ug | INTRAMUSCULAR | Status: DC | PRN
Start: 2019-07-23 — End: 2019-07-23

## 2019-07-23 MED ORDER — ONDANSETRON HCL (PF) 4 MG/2 ML INJECTION SOLUTION
Freq: Once | INTRAMUSCULAR | Status: DC | PRN
Start: 2019-07-23 — End: 2019-07-23
  Administered 2019-07-23: 09:00:00 4 mg via INTRAVENOUS

## 2019-07-23 MED ORDER — ONDANSETRON HCL (PF) 4 MG/2 ML INJECTION SOLUTION
4.0000 mg | Freq: Once | INTRAMUSCULAR | Status: DC | PRN
Start: 2019-07-23 — End: 2019-07-23

## 2019-07-23 MED ORDER — FENTANYL (PF) 50 MCG/ML INJECTION SOLUTION
12.5000 ug | INTRAMUSCULAR | Status: DC | PRN
Start: 2019-07-23 — End: 2019-07-23

## 2019-07-23 MED ORDER — FENTANYL (PF) 50 MCG/ML INJECTION SOLUTION
Freq: Once | INTRAMUSCULAR | Status: DC | PRN
Start: 2019-07-23 — End: 2019-07-23
  Administered 2019-07-23: 100 ug via INTRAVENOUS

## 2019-07-23 MED ORDER — LACTATED RINGERS INTRAVENOUS SOLUTION
INTRAVENOUS | Status: DC
Start: 2019-07-23 — End: 2019-07-23
  Administered 2019-07-23: 08:00:00

## 2019-07-23 MED ORDER — PROPOFOL 10 MG/ML INTRAVENOUS EMULSION
INTRAVENOUS | Status: DC | PRN
Start: 2019-07-23 — End: 2019-07-23
  Administered 2019-07-23: 100 ug/kg/min via INTRAVENOUS
  Administered 2019-07-23: 0 ug/kg/min via INTRAVENOUS
  Administered 2019-07-23: 200 ug/kg/min via INTRAVENOUS
  Administered 2019-07-23: 09:00:00 75 ug/kg/min via INTRAVENOUS

## 2019-07-23 MED ORDER — PHENYLEPHRINE 0.5 MG/5 ML (100 MCG/ML)IN 0.9 % SOD.CHLORIDE IV SYRINGE
INJECTION | Freq: Once | INTRAVENOUS | Status: DC | PRN
Start: 2019-07-23 — End: 2019-07-23
  Administered 2019-07-23 (×6): 100 ug via INTRAVENOUS

## 2019-07-23 MED ORDER — LACTATED RINGERS INTRAVENOUS SOLUTION
INTRAVENOUS | Status: DC
Start: 2019-07-23 — End: 2019-07-23
  Administered 2019-07-23 (×2): via INTRAVENOUS

## 2019-07-23 SURGICAL SUPPLY — 86 items
ATTACHMENT ENDOSCP 4MM 13.4MM RND EDG STRL LF DISP (GENE) ×1
ATTACHMENT ENDOSCP 4MM 13.4MM RND EDGE STRL LF  DISP (GENE) ×1 IMPLANT
ATTACHMENT ENDOSCP 4MM 15MM RN D EDG STRL LF DISP (INSTRUMENTS ENDOMECHANICAL)
ATTACHMENT ENDOSCP 4MM 15MM RND EDGE STRL LF  DISP (ENDOSCOPIC SUPPLIES) IMPLANT
CATH CRE 10-11-12MM 7.5FR 5.5CM 240CM 2.8MM 3.2MM LOW PROF GW BAL DIL ESOPH PYL BIL PEBAX STRL LF (BALLOON) IMPLANT
CATH ELHMST GLD PROBE 10FR 300CM BIPOLAR RND DIST TIP STD CONN FIRM SHAFT HMGLD STRL DISP 3.7MM MN (DIAGNOSTIC) IMPLANT
CATH ELHMST GLD PROBE 7FR 300CM BIPOLAR RND DIST TIP STD CONN FIRM SHAFT HMGLD STRL DISP 2.8MM MN (DIAGNOSTIC) IMPLANT
CLIP HMST MR CONDITIONAL BRD CATH ROT CONTROL KNOB NO SHEATH RSL 360 235CM 2.8MM 11MM OPN (SURGICAL INSTRUMENTS) IMPLANT
CONV USE ITEM 343591 - SOLIDIFY FLUID 1500ML DSPNSR L_Q TX SOLIDIFY SFTP LTS+ DISP (STER) IMPLANT
DEVICE HEMOSTASIS CLIP 360_235CM RESOLUTION (INSTRUMENTS)
DILATOR ENDOS CRE 240CM 5.5CM 11-13.5-15MM 7.5FR ESOPH PYL BIL BAL LOW PROF GW PEBAX STRL LF  DISP (BALLOON) IMPLANT
DILATOR ENDOS CRE 240CM 5.5CM 15-16.5-18MM 7.5FR ESOPH PYL BIL BAL LOW PROF GW PEBAX STRL LF  DISP (BALLOON) IMPLANT
DILATOR ENDOS CRE 240CM 5.5CM_11-13.5-15MM 7.5FR ESOPH PYL (BALLOON)
DILATOR ENDOS CRE 240CM 5.5CM_15-16.5-18MM 7.5FR ESOPH PYL (BALLOON)
DILATOR HERCULES 10-11-12_M00558680 EA (BALLOON)
DISCONTINUED USE ITEM 309153 - TRAP SPECI ARGYLE 40CC GRAD SC_REW ON CAP REM MALE CONN (Cautery Accessories) IMPLANT
DISCONTINUED USE ITEM 339015 - CONTAINR STRL 10% NEUT BF FRMLN POLYPROP GRAD LEAK RST ORNG PREFL SCREW CAP FSHR HLTHCR PRTCL GRN (CHEM) ×3 IMPLANT
DISCONTINUED USE ITEM 82101 - TUBING OXYGEN 50/CS 001302 (TUBE/TUBING & SUCTION SUPPLIES) IMPLANT
ELECTRODE ESURG 165CM ITKNIFE NANO STRL CERM 3.5MM DISP INSL TIP LF  ACPT 2.8MM CHNL ENDOS (ENDOSCOPIC SUPPLIES) IMPLANT
ELECTRODE ESURG 165CM TRIANGLETIP KNIFE STRL CERM 4.5MM DISP LF  ACPT 2.8MM CHNL ENDOS SUBMUCOSAL (ENDOSCOPIC SUPPLIES) IMPLANT
ELECTRODE ESURG 230CM DUALKNIFE J STRL CERM 1.5MM DISP BUIL IN HNDL FLUID INJ LF  ACPT 2.8MM CHNL (Cautery Accessories) IMPLANT
ELECTRODE ESURG 230CM ITKNIFE NANO STRL CERM 3.5MM DISP INSL TIP LF  ACPT 2.8MM CHNL ENDOS (CAUTERY SUPPLIES) IMPLANT
ELECTRODE PATIENT RTN 9FT VLAB C30- LB RM PHSV ACRL FOAM CORD NONIRRITATE NONSENSITIZE ADH STRP (CAUTERY SUPPLIES) IMPLANT
ELECTRODE PATIENT RTN 9FT VLAB_REM C30- LB PLHSV ACRL FOAM (CAUTERY SUPPLIES)
ENDO KIT_CS/20 (ENDOSCOPIC SUPPLIES) ×1 IMPLANT
ENDO KIT_CS/20 (INSTRUMENTS ENDOMECHANICAL) ×1
FORCEPS BIOPSY 240CM 2.4MM RJ 4 2.8MM LRG CPC DISP ORNG (GUIDING)
FORCEPS BIOPSY MICROMESH TTH STREAMLINE CATH 240CM 2.4MM RJ 4 SS LRG CPC STRL DISP ORNG 2.8MM WRK (GUIDING) IMPLANT
FORCEPS BIOPSY NEEDLE 160CM 1.8MM RJ 4 DISP YW 2MM WRK CHNL GSPED (SURGICAL INSTRUMENTS) IMPLANT
FORCEPS BIOPSY NEEDLE 240CM 2.2MM RJ 4 2.8MM STD CPC STRL DISP ORNG (SURGICAL INSTRUMENTS) IMPLANT
FORCEPS BIOPSY NEEDLE 240CM 2._2MM RJ 4 2.8MM STD CPC STRL (INSTRUMENTS)
FORCEPS ENDOS 240CM 2.8MM RJ 4 JMB DISP (SURGICAL INSTRUMENTS) ×1 IMPLANT
FORCEPS ENDOS 240CM 2.8MM RJ 4_JMB DISP (INSTRUMENTS) ×1
FORCEPS ENDOS HMST GRSPR MONOP_OL 165CM 2.75MM COAGRASPER 2.8 (INSTRUMENTS ENDOMECHANICAL)
FORCEPS ESURG 165X5MM COAGRASPER HMST STRL LF  DISP (ENDOSCOPIC SUPPLIES) IMPLANT
FORCEPS ESURG 230X4MM COAGRASPER HMST STRL LF  DISP (ENDOSCOPIC SUPPLIES) IMPLANT
FORMALIN 10% BUFF 8ML_23032059 100EA/PK (CHEM) ×3
GOWN SURG XL AAMI L3 NONREINFO RCE HKLP CLSR STRL LTX PNK SMS (DGOW)
GOWN SURG XL L3 NONREINFORCE HKLP CLSR STRL LTX PNK SMS 47IN (DGOW) IMPLANT
JELLY LUB PDI BCTRST H2O SOL N ONSTAIN NGRS STRL GLYC MTHY (SURGICAL INSTRUMENTS) IMPLANT
KIT ENDOS ENDOZIME BDSD SLR SPONGE ENZM DETERGENT 500ML (DIS) IMPLANT
KIT ENDOS ENZM BDSD SLR SPONGE_ENZM DTRG 500ML (DIS)
KNIFE ENDOS 165CM 1.7MM .9MM I TKNIFE NANO CERM SM INSL TIP (INSTRUMENTS ENDOMECHANICAL)
KNIFE ESURG 2300MM 2.8MM DUALK_NIFEJ BUIL IN HNDL FLUID INJ (Cautery Accessories)
KNIFE ESURG 230CM 1.7MM .9MM I_TKNIFE NANO SHORT LGTH CERM (CAUTERY SUPPLIES)
LIGATOR 2300MM 30MM PLLP PRELD_DEV INT HNDL DISP ENDOS (INSTRUMENTS ENDOMECHANICAL)
LIGATOR DEV STRL DISP ENDOS LF (ENDOSCOPIC SUPPLIES) IMPLANT
LINER SUCT RD CRD MEDIVAC TW L OCK LID SHTOF VALVE CAN FLTR (Suction)
LINER SUCT RD CRD MEDIVAC TW LOCK LID SHTOF VALVE CAN FILTER 1500CC LF  DISP (Suction) IMPLANT
LINER SUCT RD CRD MEDIVAC TW L_OCK LID SHTOF VALVE CAN FLTR (Suction)
LOOP AUTOCLAV HLDR DTCH 30MM S URG NYL PLPCTM NONST DISP (INSTRUMENTS ENDOMECHANICAL)
LOOP HLDR DTCH AUTOCLAV 30MM SURG NYL PLPCTM NONST LF  DISP (ENDOSCOPIC SUPPLIES) IMPLANT
MARKER ENDOS SPOT EX PERM IND DRK SYRG 5ML (GENE) IMPLANT
MARKER ENDOS SPOT EX PREFL PRE_ASSEMBLE SYRG PERM (GENE)
NEEDLE ENDOS 5MM 25GA 2.5MM SS TFLN 230CM INJ PRJ SHEATH LL SPRG LD HNDL CRLK STRL LF  DISP (NEEDLES & SYRINGE SUPPLIES) IMPLANT
NEEDLE ENDOS 5MM 25GA 2.5MM SS_TFLN 230CM INJ PRJ SHTH LL (NEEDLES & SYRINGE SUPPLIES)
NEEDLE SCLRTX 25GA 2.3MM OPTC TIP SHTH STRL LF DISP YW (NEEDLES & SYRINGE SUPPLIES) IMPLANT
NET SPEC RETR 160CM 3MM RTHNT MAXI SHEATH 8X4CM NONST LF  DISP (Dilators) IMPLANT
NET SPEC RETR 160MM 1.8MM RTHN_T MINI 4.5X2CM PED NONST LF (Dilators)
NET SPEC RETR 8X4CM 3MM RTHNT_MAXI 160CM NONST LF DISP (Dilators)
PROBE COAG 7.2FT 6.9FR FIAPC CRCMF PLUG PLAY FUNCTIONALITY FILTER (ENDOSCOPIC SUPPLIES) IMPLANT
PROBE ESURG 220CM 2.3MM FIAPC FLXB STR FIRE STRL DISP (CAUTERY SUPPLIES) IMPLANT
PROBE ESURG 220CM 2.3MM FIAPC_FLXB STR FIRE ARGON PLAS COAG (CAUTERY SUPPLIES)
PROBE ESURG 7.2FT 6.9FR FIAPC_CRCMF PLUG PLAY FUNCTIONALITY (INSTRUMENTS ENDOMECHANICAL)
RETRIEVER 230CM 2.5MM ALT WV N T ENDOS NONST RTHNT 6X3CM (INSTRUMENTS ENDOMECHANICAL)
RETRIEVER ENDOS 160CM 1.8MM RTHNT MINI SM CATH SHEATH 4.5X2CM NONST (Dilators) IMPLANT
RETRIEVER ENDOS 230CM 2.5MM RTHNT ALT WV 6X3CM (ENDOSCOPIC SUPPLIES) IMPLANT
SNARE 9MM 230CM 2.4MM EXACTO COLD BRD WRE CLEAN CUT ENDOS PLC RESCT STRL LF  DISP (ENDOSCOPIC SUPPLIES) IMPLANT
SNARE 9MM 230CM 2.4MM EXACTO C_OLD BRD WRE CLEAN CUT ENDOS (INSTRUMENTS ENDOMECHANICAL)
SNARE MED OVAL 240CM 2.4MM SNS LOOP SHRTHRW FLXB ENDOS 2.8MM WRK CHNL PLPCTM 27MM STRL DISP (ENDOSCOPIC SUPPLIES) IMPLANT
SNARE MICRO 240IN 13MM CAPTIVATR PLPCTM HEX ENDOS STRL DISP (ENDOSCOPIC SUPPLIES) ×1 IMPLANT
SNARE SM HEX 240CM 2.4MM CAPTI_VATR LOOP STF ENDOS PLPCTM 13 (INSTRUMENTS ENDOMECHANICAL) ×2
SNARE SM OVAL 240CM 2.4MM SNS LOOP SHRTHRW FLXB ENDOS PLPCTM 13MM STRL LF  DISP (DIAGNOSTIC) IMPLANT
SOLIDIFY FLUID 1500ML DSPNSR L_Q TX SOLIDIFY SFTP LTS+ DISP (STER)
SOLUTION IRRG H2O 500CC 2F7113 (SOLUTIONS) ×1
SYRINGE INFLAT ALN II GA STRL DISP 60ML (NEEDLES & SYRINGE SUPPLIES) IMPLANT
SYRINGE INFLAT ALN II GA STRL_DISP 60ML (NEEDLES & SYRINGE SUPPLIES)
SYRINGE LL 50ML LF  STRL GRAD N-PYRG DEHP-FR PVC FREE MED DISP CLR (NEEDLES & SYRINGE SUPPLIES) IMPLANT
TRAP SPEC RETR 15CM ETRAP PLPC_TM STRL DISP (INSTRUMENTS) ×1
TRAP SPEC RETR ETRAP MAGNIFY W IND MSR GUIDE PLYP LF DISP (GI LAB SUPPLIES) IMPLANT
TRAP SPECI ARGYLE 40CC GRAD SC_REW ON CAP REM MALE CONN (Cautery Accessories)
TRAP SPECI REM 15CM ETRAP MAGNIFY WIND MSR GUIDE PLPCTM STRL LF  DISP (SURGICAL INSTRUMENTS) ×1 IMPLANT
TUBING OXYGEN 50/CS 001302 (TUBE/TUBING & SUCTION SUPPLIES)
TUBING SUCT CLR 20FT 9/32IN MEDIVAC NCDTV M/M CONN STRL LF (Suction) IMPLANT
TUBING SUCT CONN 20FT LONG STRL N720A (Suction)
WATER STRL 500ML PLASTIC PR BTL LF (SOLUTIONS) ×1 IMPLANT

## 2019-07-23 NOTE — Nurses Notes (Signed)
Reviewed discharge instructions with Diane, friend, verbalized understanding. Discharged via wheelchair.

## 2019-07-23 NOTE — H&P (Addendum)
[facility]  GI Admission History and Physical      [name], [name]   MRN:  [id number]  Date of Birth:  Oct 03, 1956    Date of Procedure:  07/23/2019    Chief Complaint: Screening colonoscopy    HPI: [name], [name] is a 63 y.o. year old female who presents today for Colonoscopy.   This is the patient's 2nd colonoscopy.  Her 1st colonoscopy was in 2010 were no polyps were found.  There is no family history of colon cancer, advanced adenoma, advanced adenoma or advanced serrated adenoma.    This procedure is being done to evaluate No GI symptoms    The patient denies any hematological, cardiovascular, pulmonary, or gastrointestinal symptoms    Past Medical History:   Diagnosis Date   . Anxiety    . Arthritis    . Cancer (CMS HCC)     internal skin cancer   . Chronic pain     lower back   . Depression    . Dyspnea on exertion    . Essential hypertension 02/16/2017    patient denies   . GERD (gastroesophageal reflux disease)     controlled w/med   . Headache    . Heart murmur 1979    benign   . Hyperlipidemia LDL goal < 100 08/29/2013    denies   . MINOR CAD (coronary artery disease) 11/10/2012    no treatment other than cholesterol medications; follows regularly with Dr. Rodney Booze   . Multinodular goiter    . Muscle weakness     neck    . Obesity    . Peripheral edema    . Rash     face only eczema   . Rheumatic fever 16    no complications   . Squamous cell carcinoma 02/06/2015    right side of neck   . 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 Vaginal prolapse 2012    surgery improved significantly   . Wears glasses            Allergies   Allergen Reactions   . Cymbalta [Duloxetine]      Rash on feet and hands   . Blue Dye      Told to avoid due to patch test result by Simone [facility]   . Flavoring Agent  Other Adverse Reaction (Add comment)     Patient unsure of reaction   . Lipitor [Atorvastatin] Myalgia     Muscle pain   . Nickel      Blisters  "Allergic to all kinds of metal"    . Codeine Itching      RASH   . Mobic [Meloxicam] Nausea/ Vomiting     Nervous and shakes    . Naprosyn [Naproxen] Nausea/ Vomiting   . Nsaids (Non-Steroidal Anti-Inflammatory Drug) Nausea/ Vomiting       Prior to Admission medications    Medication Sig Start Date End Date Taking? Authorizing Provider   aspirin (ECOTRIN) 81 mg Oral Tablet, Delayed Release (E.C.) Take 81 mg by mouth Once a day    Provider, Historical   Azelaic Acid 20 % Cream Apply topically Twice daily  Patient taking differently: Apply topically Twice per day as needed  07/29/15   Mannie Stabile, MD   cholecalciferol, vitamin D3, (VITAMIN D) 25 mcg (1,000 unit) Oral Tablet Take 1 Tab (1,000 Units total) by mouth Once a day 04/26/19   Dennison Mascot, MD   Clobetasol (TEMOVATE) 0.05 %  Ointment Apply topically Twice daily To rash on hands and feet  Patient taking differently: Apply topically Twice per day as needed To rash on hands and feet 08/19/14   [name], MD   clobetasoL (TEMOVATE) 0.05 % Ointment Apply topically    Provider, Historical   erythromycin (ROMYCIN) 5 mg/gram (0.5 %) Ophthalmic Ointment Instill into right eye Twice daily  Patient not taking: Reported on 07/19/2019 04/04/19   Lorn Junes, PA-C   FLUoxetine (SARAFEM) 20 mg Oral Tablet Take 1 Tab (20 mg total) by mouth Twice daily 06/28/19   Andria Rhein, MD   fluticasone propionate [facility]) 50 mcg/actuation Nasal Spray, Suspension 1 Spray by Each Nostril route Once a day 02/13/19   Dennison Mascot, MD   gabapentin (NEURONTIN) 600 mg Oral Tablet Take 1 Tab (600 mg total) by mouth Twice daily 03/20/19   Catalina Gravel, MD   Halobetasol Propionate (ULTRAVATE) 0.05 % Cream Apply  topically. Three times a week  Patient taking differently: Twice per day as needed Apply  topically. Three times a week 04/23/16   McCready, Natalie, PA-C   Halobetasol Propionate (ULTRAVATE) 0.05 % Cream Apply topically    Provider, Historical   hydrOXYzine HCL (ATARAX) 50 mg Oral Tablet TAKE 1 TABLET BY MOUTH  EVERY DAY AT NIGHT  Patient taking differently: Take 50 mg by mouth Every evening  07/17/19   Jasmine Pang, MD   ipratropium-albuterol 0.5 mg-3 mg(2.5 mg base)/3 mL Solution for Nebulization 3 mL by Nebulization route Four times a day 06/04/19   Traci Sermon, DO   loratadine-pseudoephedrine (CLARITIN-D) 5-120 mg Oral Tablet Sustained Release 12 hr Take 1 Tab by mouth Twice daily 06/04/19   Traci Sermon, DO   methocarbamoL (ROBAXIN) 500 mg Oral Tablet Take 1 Tab (500 mg total) by mouth Four times a day For muscle spasms 02/02/19   Tomasa Rand (Gene), MD   Metronidazole (METROCREAM) 0.75 % Cream Apply topically Twice daily  Patient taking differently: Apply topically Twice per day as needed  07/29/15   Mannie Stabile, MD   Metronidazole (METROCREAM) 0.75 % Cream Apply topically    Provider, Historical   mineral oil-hydrophil petrolat (AQUAPHOR) Ointment Apply topically Once per day as needed     Provider, Historical   montelukast (SINGULAIR) 10 mg Oral Tablet Take 1 Tab (10 mg total) by mouth Every evening 02/13/19   Dennison Mascot, MD   nitroGLYCERIN (NITROSTAT) 0.4 mg Sublingual Tablet, Sublingual 1 Tab (0.4 mg total) by Sublingual route Every 5 minutes as needed for Chest pain for up to 3 doses for 3 doses over 15 minutes 02/21/16   Tia Alert, MD   nystatin (NYSTOP) 100,000 unit/gram Powder Apply once or twice daily to PREVENT rash under breasts 06/21/16   [name], MD   nystatin (NYSTOP) 100,000 unit/gram Powder Apply 1 g topically    Provider, Historical   pantoprazole (PROTONIX) 40 mg Oral Tablet, Delayed Release (E.C.) Take 1 Tab (40 mg total) by mouth Every morning before breakfast 02/26/19   Dennison Mascot, MD   tolterodine (DETROL) 2 mg Oral Tablet Take 2 mg by mouth Once a day 10/13/18   Provider, Historical   traZODone (DESYREL) 50 mg Oral Tablet Take 1/2 to full tablet as needed for sleep.  Patient not taking: Reported on 07/19/2019 11/10/18   Jasmine Pang, MD   triamcinolone  acetonide (ARISTOCORT A) 0.1 % Ointment by Apply Topically route Twice daily  Patient taking differently: by Apply Topically route Twice per day as needed  07/29/15   Mannie Stabile, MD   triamcinolone acetonide (ARISTOCORT A) 0.1 % Ointment Apply topically    Provider, Historical        Past Surgical History:   Procedure Laterality Date   . COLONOSCOPY  01/06/09    COLONOSCOPY performed by Loletta Parish F at [place]   . GASTROSCOPY  03/05/2010    GASTROSCOPY performed by Jocelyn Lamer at [place]   . GASTROSCOPY WITH BIOPSY  03/05/2010    GASTROSCOPY WITH BIOPSY performed by Jocelyn Lamer at [place]   . HX ADENOIDECTOMY     . HX ANKLE FRACTURE TX  2007    left distal fibula, casted   . HX APPENDECTOMY     . HX CHOLECYSTECTOMY     . HX COLONOSCOPY     . HX CYSTOCELE REPAIR  09/24/09   . HX HAND SURGERY  2012    for Carpal tunnel    . HX HEART CATHETERIZATION     . HX OOPHORECTOMY      left ovary removed   . HX PAROTIDECTOMY       "surgical clamp"    . HX PARTIAL THYROIDECTOMY Right 04/05/2019    R hemithyroidectomy for nodule, final path follicular adenoma   . HX TONSILLECTOMY     . HX TOTAL VAGINAL HYSTERECTOMY  1979   . HX WRIST FRACTURE [place]  2007    [place] injury   . PB REVISE ULNAR NERVE AT ELBOW Left 1979   . PB UPPER GI ENDOSCOPY,BIOPSY  12/19/07    patulous GE junction zone, erythema, nonerosive GERD           Physical Exam:  HEENT: Airway patent, Trachea midline, No large goiters or neck lymphadenopathy  Heart: Regular rate and rhythm, no murmurs  Lungs: Clear to auscultation bilaterally without decreased breath sounds, rhonchi, or wheezes  Abdomen: Soft, nondistended, positive bowel sounds, nontender      Assessment:  Appropriate candidate for colonoscopy    Plan:  Proceed with colonoscopy    Orders Placed This Encounter   . OXYGEN - NASAL CANNULA   . [place]   . INSERT & MAINTAIN PERIPHERAL IV ACCESS   . INSERT & MAINTAIN PERIPHERAL IV ACCESS   . AND Linked Order Group    . NS flush syringe       . NS flush syringe   . LR premix infusion   . fentaNYL (SUBLIMAZE) 50 mcg/mL injection   . fentaNYL (SUBLIMAZE) 50 mcg/mL injection   . AND Linked Order Group    . NS flush syringe    . NS flush syringe   . LR premix infusion   . ondansetron (ZOFRAN) 2 mg/mL injection         Virgel Manifold, MD

## 2019-07-23 NOTE — Anesthesia Preprocedure Evaluation (Signed)
ANESTHESIA PRE-OP EVALUATION  Planned Procedure: COLONOSCOPY (N/A Anus)  Review of Systems                   Pulmonary     Cardiovascular    No peripheral edema,        GI/Hepatic/Renal        Endo/Other          Neuro/Psych/MS        Cancer                     Physical Assessment      Airway       Mallampati: I    TM distance: >3 FB    Neck ROM: limited  Mouth Opening: good.  No Facial hair  No Beard  No endotracheal tube present  No Tracheostomy present    Dental           (+) upper dentures, lower dentures, edentulous           Pulmonary    Breath sounds clear to auscultation  (-) no rhonchi, no decreased breath sounds, no wheezes, no rales and no stridor     Cardiovascular    Rhythm: regular  Rate: Normal  (-) no friction rub, carotid bruit is not present, no peripheral edema and no murmur     Other findings            Plan  ASA 3     Planned anesthesia type: MAC              Intravenous induction     Anesthesia issues/risks discussed are: Dental Injuries, PONV, Post-op Pain Management, Intraoperative Awareness/ Recall and Cardiac Events/MI.  Anesthetic plan and risks discussed with patient.          Patient's NPO status is appropriate for Anesthesia.           Plan discussed with CRNA.                     Other Studies:   Lab results from outside facility :      Consults:     Patient instructed to

## 2019-07-23 NOTE — Discharge Instructions (Signed)
SURGICAL DISCHARGE INSTRUCTIONS     Dr. Virgel Manifold, MD  performed your COLONOSCOPY today at the [facility]:  Monday through Friday from 6 a.m. - 7 p.m.: [phone]  Between 7 p.m. - 6 a.m., weekends and holidays:  Call [facility] at [phone] or [phone].    PLEASE SEE WRITTEN HANDOUTS AS DISCUSSED BY YOUR NURSE:        SIGNS AND SYMPTOMS OF A WOUND / INCISION INFECTION   Be sure to watch for the following:   Increase in redness or red streaks near or around the wound or incision.   Increase in pain that is intense or severe and cannot be relieved by the pain medication that your doctor has given you.   Increase in swelling that cannot be relieved by elevation of a body part, or by applying ice, if permitted.   Increase in drainage, or if yellow / green in color and smells bad. This could be on a dressing or a cast.   Increase in fever for longer than 24 hours, or an increase that is higher than 101 degrees Fahrenheit (normal body temperature is 98 degrees Fahrenheit). The incision may feel warm to the touch.    **CALL YOUR DOCTOR IF ONE OR MORE OF THESE SIGNS / SYMPTOMS SHOULD OCCUR.    ANESTHESIA INFORMATION   ANESTHESIA -- ADULT PATIENTS:  You have received intravenous sedation / general anesthesia, and you may feel drowsy and light-headed for several hours. You may even experience some forgetfulness of the procedure. DO NOT DRIVE A MOTOR VEHICLE or perform any activity requiring complete alertness or coordination until you feel fully awake in about 24-48 hours. Do not drink alcoholic beverages for at least 24 hours. Do not stay alone, you must have a responsible adult available to be with you. You may also experience a dry mouth or nausea for 24 hours. This is a normal side effect and will disappear as the effects of the medication wear off.    REMEMBER   If you experience any difficulty breathing, chest pain, bleeding that you feel is excessive,  persistent nausea or vomiting or for any other concerns:  Call your physician Dr. Larey Seat at [phone] or [phone]. You may also ask to have the surgery doctor on call paged. They are available to you 24 hours a day.    SPECIAL INSTRUCTIONS / COMMENTS       FOLLOW-UP APPOINTMENTS   Please call patient services at [phone] or [phone] to schedule a date / time of return. They are open Monday - Friday from 7:30 am - 5:00 pm.

## 2019-07-23 NOTE — Anesthesia Transfer of Care (Signed)
ANESTHESIA TRANSFER OF CARE   [name] is a 63 y.o. ,female, Weight: 100 kg (221 lb 1.9 oz)   had Procedure(s):  COLONOSCOPY  COLONOSCOPY WITH POLYPECTOMY  COLONOSCOPY WITH BIOPSY  performed  07/23/19   Primary Service: Virgel Manifold, MD    Past Medical History:   Diagnosis Date   . Anxiety    . Arthritis    . Cancer (CMS HCC)     internal skin cancer   . Chronic pain     lower back   . Depression    . Dyspnea on exertion    . Essential hypertension 02/16/2017    patient denies   . GERD (gastroesophageal reflux disease)     controlled w/med   . Headache    . Heart murmur 1979    benign   . Hyperlipidemia LDL goal < 100 08/29/2013    denies   . MINOR CAD (coronary artery disease) 11/10/2012    no treatment other than cholesterol medications; follows regularly with Dr. Rodney Booze   . Multinodular goiter    . Muscle weakness     neck    . Obesity    . Peripheral edema    . Rash     face only eczema   . Rheumatic fever 16    no complications   . Squamous cell carcinoma 02/06/2015    right side of neck   . 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 Vaginal prolapse 2012    surgery improved significantly   . Wears glasses       Allergy History as of 07/23/19     CODEINE       Noted Status Severity Type Reaction    12/10/15 0941 Pantojas, Clifton  Active Low  Itching    Comments:  RASH     09/19/13 1430 Wallace Cullens Joy  Deleted Low  Itching    Comments:  RASH     09/04/09 Ginette Pitman  Active   Itching    Comments:  RASH     10/10/07   Active       Comments:  RASH           STRAWBERRY       Noted Status Severity Type Reaction    03/18/11 1634 Johnnette Barrios  Deleted       10/10/08 [name], MD  Deleted       10/10/07   Active             MELOXICAM       Noted Status Severity Type Reaction    11/24/18 0857 Kisner, Bishop Limbo, RN 06/26/09 Active Low  Nausea/ Vomiting    Comments:  Nervous and shakes      12/10/15 0941 Pantojas, Marita [place] 06/26/09 Active Low  Nausea/ Vomiting    06/26/09  Waldon Reining 06/26/09 Active   Nausea/ Vomiting          NICKEL       Noted Status Severity Type Reaction    07/19/19 0959 Linnell Fulling, RN 07/24/12 Active  Topical     Comments:  Blisters  "Allergic to all kinds of metal"      11/24/18 0856 Kisner, Bishop Limbo, RN 07/24/12 Active  Topical     Comments:  Blisters       07/24/12 1041 [name], MD 07/24/12 Active  Topical           BLUE DYE  Noted Status Severity Type Reaction    07/24/12 1041 [name], MD 07/24/12 Active  Topical     Comments:  Told to avoid due to patch test result by [facility] [place]           DULOXETINE       Noted Status Severity Type Reaction    07/24/12 1052 [name], MD 07/24/12 Active Medium Systemic     Comments:  Rash on feet and hands           NAPROXEN       Noted Status Severity Type Reaction    11/01/12 0913 Sanjuana Letters, MA 11/01/12 Active Low  Nausea/ Vomiting          ATORVASTATIN       Noted Status Severity Type Reaction    06/19/18 2149 Erskine Squibb, RX STUDENT 01/27/15 Active  Side Effect Myalgia    Comments:  Muscle pain     01/27/15 1035 [name], MD 01/27/15 Active  Side Effect     Comments:  Muscle pain           FLAVORING AGENT       Noted Status Severity Type Reaction    07/19/19 0958 Linnell Fulling, RN 05/03/17 Active    Other Adverse Reaction (Add comment)    Comments:  Patient unsure of reaction     04/03/18 1905 Suella Grove, MA 05/03/17 Active             NSAIDS (NON-STEROIDAL ANTI-INFLAMMATORY DRUG)       Noted Status Severity Type Reaction    11/24/18 0857 Kisner, Bishop Limbo, RN 05/03/17 Active Low  Nausea/ Vomiting    04/03/18 1905 Suella Grove, MA 05/03/17 Active                 I completed my transfer of care / handoff to the receiving personnel during which we discussed:  Access, Airway, All key/critical aspects of case discussed, Analgesia, Antibiotics, Expectation of post procedure, Fluids/Product, Gave opportunity for  questions and acknowledgement of understanding, Labs and PMHx                                              Additional Info:Report given to RN in area E                      Last OR Temp: Temperature: 36 C (96.8 F)  ABG:  PCO2 (VENOUS)   Date Value Ref Range Status   02/19/2016 36.00 (L) 41.00 - 51.00 mm/Hg Final     PO2 (VENOUS)   Date Value Ref Range Status   02/19/2016 43.0 35.0 - 50.0 mm/Hg Final     POTASSIUM   Date Value Ref Range Status   03/05/2019 4.2 3.5 - 5.1 mmol/L Final   01/23/2015 4.3 3.5 - 5.1 mmol/L Final     KETONES   Date Value Ref Range Status   12/12/2017 Negative Negative mg/dL Final     WHOLE BLOOD POTASSIUM   Date Value Ref Range Status   02/19/2016 3.9 3.5 - 5.0 mmol/L Final     CALCIUM   Date Value Ref Range Status   03/05/2019 9.2 8.5 - 10.2 mg/dL Final   01/23/2015 9.8 8.5 - 10.4 mg/dL Final     Comment:     Test performed by [facility],  [address], [place],  [place] [address]       Calculated P Axis   Date Value Ref Range Status   11/24/2018 44 degrees Final     Calculated R Axis   Date Value Ref Range Status   11/24/2018 -16 degrees Final     Calculated T Axis   Date Value Ref Range Status   11/24/2018 -4 degrees Final     IONIZED CALCIUM   Date Value Ref Range Status   02/19/2016 1.19 1.10 - 1.36 mmol/L Final     GLUCOSE, POINT OF CARE   Date Value Ref Range Status   02/06/2015 104 70 - 105 mg/dL Final     HEMOGLOBIN   Date Value Ref Range Status   02/19/2016 13.9 12.0 - 18.0 g/dL Final     OXYHEMOGLOBIN   Date Value Ref Range Status   02/19/2016 83.5 (H) 40.0 - 80.0 % Final     CARBOXYHEMOGLOBIN   Date Value Ref Range Status   02/19/2016 1.9 0.0 - 2.5 % Final     MET-HEMOGLOBIN   Date Value Ref Range Status   02/19/2016 0.1 0.0 - 3.5 % Final     BASE EXCESS   Date Value Ref Range Status   02/19/2016 0.6 -3.0 - 3.0 mmol/L Final     BICARBONATE (VENOUS)   Date Value Ref Range Status   02/19/2016 25.0 22.0 - 26.0 mmol/L Final     %FIO2 (VENOUS)   Date Value Ref Range Status      02/19/2016 28.0 % Final     Airway:* No LDAs found *  Blood pressure 100/72, pulse 73, temperature 36 C (96.8 F), resp. rate 16, height 1.651 m (5' 5" ), weight 100 kg (221 lb 1.9 oz), SpO2 93 %, not currently breastfeeding.

## 2019-07-23 NOTE — Anesthesia Postprocedure Evaluation (Signed)
Anesthesia Post Op Evaluation    Patient: [name]  Procedure(s):  COLONOSCOPY  COLONOSCOPY WITH POLYPECTOMY  COLONOSCOPY WITH BIOPSY    Last Vitals:Temperature: 36 C (96.8 F) (07/23/19 0916)  Heart Rate: 73 (07/23/19 0916)  BP (Non-Invasive): 100/72 (07/23/19 0916)  Respiratory Rate: 16 (07/23/19 0916)  SpO2: 93 % (07/23/19 0916)    Patient is sufficiently recovered from the effects of anesthesia to participate in the evaluation and has returned to their pre-procedure level.  Patient location during evaluation: PACU       Patient participation: complete - patient participated  Level of consciousness: awake and alert and responsive to verbal stimuli    Pain management: adequate  Airway patency: patent    Anesthetic complications: no  Cardiovascular status: acceptable  Respiratory status: acceptable  Hydration status: acceptable  Patient post-procedure temperature: Pt Normothermic   PONV Status: Absent

## 2019-07-24 LAB — SURGICAL PATHOLOGY SPECIMEN

## 2019-07-25 ENCOUNTER — Encounter (INDEPENDENT_AMBULATORY_CARE_PROVIDER_SITE_OTHER): Payer: Self-pay | Admitting: Gastroenterology

## 2019-07-27 ENCOUNTER — Other Ambulatory Visit (HOSPITAL_BASED_OUTPATIENT_CLINIC_OR_DEPARTMENT_OTHER): Payer: Self-pay | Admitting: Family Medicine

## 2019-07-27 DIAGNOSIS — J309 Allergic rhinitis, unspecified: Secondary | ICD-10-CM

## 2019-08-13 ENCOUNTER — Telehealth: Payer: MEDICAID | Admitting: Psychiatry

## 2019-08-13 ENCOUNTER — Other Ambulatory Visit: Payer: Self-pay

## 2019-08-13 DIAGNOSIS — F4310 Post-traumatic stress disorder, unspecified: Secondary | ICD-10-CM

## 2019-08-13 DIAGNOSIS — F419 Anxiety disorder, unspecified: Secondary | ICD-10-CM

## 2019-08-13 DIAGNOSIS — F331 Major depressive disorder, recurrent, moderate: Secondary | ICD-10-CM

## 2019-08-13 MED ORDER — BUPROPION HCL XL 150 MG 24 HR TABLET, EXTENDED RELEASE
150.0000 mg | ORAL_TABLET | Freq: Every day | ORAL | 2 refills | Status: DC
Start: 2019-08-13 — End: 2019-11-06

## 2019-08-13 NOTE — Progress Notes (Signed)
[facility]  Department of Behavioral Medicine and Psychiatry    Outpatient Progress Note      [name]   [id number]  Jan 01, 1956    DOS: 08/13/2019    Phone   I personally offered the service to the patient, and obtained verbal consent to provide this service.    Jasmine Pang, MD  15 minutes on the phone    TELEMEDICINE DOCUMENTATION:    Patient Location:  MyChart video visit from home address: [address]  [place] [address]    Patient/family aware of provider location:  yes  Patient/family consent for telemedicine:  yes  Examination observed and performed by:  Jasmine Pang, MD      Chief complaint:   Chief Complaint   Patient presents with   . Major Depression        ID:  [name] is a 63 y.o. female who  has a past medical history of Anxiety, Arthritis, Cancer (CMS [place]), Chronic pain, Depression, Dyspnea on exertion, Essential hypertension (02/16/2017), GERD (gastroesophageal reflux disease), Headache, Heart murmur (1979), Hyperlipidemia LDL goal < 100 (08/29/2013), MINOR CAD (coronary artery disease) (11/10/2012), Multinodular goiter, Muscle weakness, Obesity, Peripheral edema, Rash, Rheumatic fever (16), Squamous cell carcinoma (123456), 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    Subjective:  Patient presents alone for a follow-up visit for management of  depression.      Feeling down lately with low energy.   Mood has been low.  Not getting outdoors.  Therapist is not  Available to talk as she was told they are no longer employed at that office.  She does get support from talking to friends on the phone.  No suicidal thoughts.        Reported side effects: no insomnia, no changes in appetite or weight, no galactorrhea or EPS, no tremor, no rash     Collateral obtained/reviewed:     Medical Hx: no changes since last  visit   Social Hx: no changes since last visit  Previous medication trials:    -         Current Medications:  Current Outpatient Medications   Medication Sig   . aspirin (ECOTRIN) 81 mg Oral Tablet, Delayed Release (E.C.) Take 81 mg by mouth Once a day   . Azelaic Acid 20 % Cream Apply topically Twice daily (Patient taking differently: Apply topically Twice per day as needed )   . cholecalciferol, vitamin D3, (VITAMIN D) 25 mcg (1,000 unit) Oral Tablet Take 1 Tab (1,000 Units total) by mouth Once a day   . Clobetasol (TEMOVATE) 0.05 % Ointment Apply topically Twice daily To rash on hands and feet (Patient taking differently: Apply topically Twice per day as needed To rash on hands and feet)   . clobetasoL (TEMOVATE) 0.05 % Ointment Apply topically   . erythromycin (ROMYCIN) 5 mg/gram (0.5 %) Ophthalmic Ointment Instill into right eye Twice daily (Patient not taking: Reported on 07/19/2019)   . FLUoxetine (SARAFEM) 20 mg Oral Tablet Take 1 Tab (20 mg total) by mouth Twice daily   . fluticasone propionate (FLONASE) 50 mcg/actuation Nasal Spray, Suspension 1 Spray by Each Nostril route Once a day   . gabapentin (NEURONTIN) 600 mg Oral Tablet Take 1 Tab (600 mg total) by mouth Twice daily   . Halobetasol Propionate (ULTRAVATE) 0.05 % Cream Apply  topically. Three times a week (Patient taking differently: Twice per day  as needed Apply  topically. Three times a week)   . hydrOXYzine HCL (ATARAX) 50 mg Oral Tablet TAKE 1 TABLET BY MOUTH EVERY DAY AT NIGHT (Patient taking differently: Take 50 mg by mouth Every evening )   . ipratropium-albuterol 0.5 mg-3 mg(2.5 mg base)/3 mL Solution for Nebulization 3 mL by Nebulization route Four times a day   . loratadine-pseudoephedrine (CLARITIN-D) 5-120 mg Oral Tablet Sustained Release 12 hr Take 1 Tab by mouth Twice daily   . methocarbamoL (ROBAXIN) 500 mg Oral Tablet Take 1 Tab (500 mg total) by mouth Four times a day For muscle spasms   . Metronidazole (METROCREAM) 0.75 % Cream Apply topically Twice daily (Patient taking differently: Apply topically Twice per day  as needed )   . Metronidazole (METROCREAM) 0.75 % Cream Apply topically   . mineral oil-hydrophil petrolat (AQUAPHOR) Ointment Apply topically Once per day as needed    . montelukast (SINGULAIR) 10 mg Oral Tablet Take 1 Tab (10 mg total) by mouth Once a day   . nitroGLYCERIN (NITROSTAT) 0.4 mg Sublingual Tablet, Sublingual 1 Tab (0.4 mg total) by Sublingual route Every 5 minutes as needed for Chest pain for up to 3 doses for 3 doses over 15 minutes   . nystatin (NYSTOP) 100,000 unit/gram Powder Apply once or twice daily to PREVENT rash under breasts   . pantoprazole (PROTONIX) 40 mg Oral Tablet, Delayed Release (E.C.) Take 1 Tab (40 mg total) by mouth Every morning before breakfast   . tolterodine (DETROL) 2 mg Oral Tablet Take 2 mg by mouth Once a day   . traZODone (DESYREL) 50 mg Oral Tablet Take 1/2 to full tablet as needed for sleep. (Patient not taking: Reported on 07/19/2019)   . triamcinolone acetonide (ARISTOCORT A) 0.1 % Ointment by Apply Topically route Twice daily (Patient taking differently: by Apply Topically route Twice per day as needed )         Review of systems:   Constitutional: no insomnia, no changes in appetite, no weight loss/gain  Cardiac: no chest pain/palpitations  Resp: no shortness of breath  GI: no n/v/d/c  GU: no urinary retention, no incontinence   Msk: no arthralgias/myalgias, no rigidity, no tremulousness, no abnormal movements   Neuro: no headaches, no blurry vision, no balance issues, no falls    Physical Exam:   There were no vitals filed for this visit.      There is no height or weight on file to calculate BMI.    none      Mental Status Exam:  This is an alert  and grossly oriented 63 y.o. female  and she is cooperative and pleasant. Speech is regular rate and volume, clear and articulate. Mood is sad. Thought process is goal-directed with appropriate thought content. Denies SI/HI. No hallucinations, delusions, or paranoia voiced or suspected. Attention is adequate for the  conversation and she displays a fair ability to think abstractly. Judgement and insight is fair.         Assessment:  This is a 63 y.o. female with a history of depression and PTSD presenting for follow-up.  Patient was staying with a sister in [place] but recently was able to move into her own apartment in [place] with HUD assistance.  She receives SSI disability.  She lives by herself.  She takes prozac 40 mg daily for management of depression.  She has had depression since age 65 with multiple episodes of depression.  She has had SI but no attempts.  One hospitalization.  Current stressors include finances and health problems.  Sleep has worsened with difficult staying asleep that responded to atarax..  She has had a sleep study over 10 years ago but may need another.      Energy has been low with poor mood and she is agreeable to Wellbutrin.        Diagnosis:  ENCOUNTER DIAGNOSES     ICD-10-CM   1. PTSD (post-traumatic stress disorder)  F43.10   2. 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 Anxiety  F41.9       Plan:  1. Continue fluoxetine 40 mg daily - she will utilize the tablets without blue dye due to an allergy.  1. Taken as 2 20 tabs daily.  2. Start Wellbutrin XL 150 mg daily for low energy  3. Continue atarax 50 mg qhs for sleep - keep formulation - can not have blue due due to allergy    Medication Orders   Medications   . buPROPion (WELLBUTRIN XL) 150 mg extended release 24 hr tablet     Sig: Take 1 Tab (150 mg total) by mouth Once a day     Dispense:  30 Tab     Refill:  2       Laboratory Studies:  None    [facility]     Safety: Low risk for imminent harm to self.    Follow up: Return to clinic in 3 months . Patient advised to call in the interim with questions/concerns and to go to the ER if safety concerns arise.         Jasmine Pang, MD  [facility]  08/13/2019              I saw and examined the patient.  I reviewed the resident's note.  I agree with the findings and plan of care as documented in the resident's note.  Any exceptions/additions are edited/noted. Seen by video for 3 minutes    Galen Manila, MD

## 2019-08-14 ENCOUNTER — Encounter (HOSPITAL_BASED_OUTPATIENT_CLINIC_OR_DEPARTMENT_OTHER): Payer: Self-pay | Admitting: Family Medicine

## 2019-08-21 ENCOUNTER — Other Ambulatory Visit: Payer: Self-pay

## 2019-08-21 ENCOUNTER — Ambulatory Visit: Payer: MEDICAID | Attending: Neurology | Admitting: Neurology

## 2019-08-21 VITALS — BP 142/88 | HR 98 | Ht 65.0 in | Wt 221.8 lb

## 2019-08-21 DIAGNOSIS — G90511 Complex regional pain syndrome I of right upper limb: Secondary | ICD-10-CM

## 2019-08-21 DIAGNOSIS — M79601 Pain in right arm: Secondary | ICD-10-CM

## 2019-08-21 DIAGNOSIS — M5412 Radiculopathy, cervical region: Secondary | ICD-10-CM | POA: Insufficient documentation

## 2019-08-21 DIAGNOSIS — M542 Cervicalgia: Secondary | ICD-10-CM

## 2019-08-21 NOTE — Progress Notes (Addendum)
[facility]  Outpatient Progress Note    Date:  08/21/2019  Name: [name]  Age:  63 y.o.  Referring Physician:   Drema Balzarine, [place]  [place], [place] [address]    History of Present Illness  History obtained from:  patient   PROGRESS NOTE     SUBJECTIVE:  [name] is a 63 year old right-handed Caucasian woman who comes to return visit at the neurology clinic. 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     Today she admits to recent swelling in the right submandibular region of her neck after a right hemithyroidectomy on April 05 2019 for a nodule on the right thyroid lobe. She also has shooting pain when turning her head to the right or left. 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 She has occasional pain in her right 5th digit and right 5th toe that improves with activity. She also mentioned having intermittent headaches that do not appear to be getting worse.     Pt mentioned that Psychiatry started her on Wellbutrin qAM last week.     There was no plexopathy, radiculopathy noted on the EMG nerve conduction study from July 2019. There was no evidence of median neuropathy or at the right wrist or right ulna neuropathy.      PAST MEDICAL HISTORY:  Depression.  Benign Thyroid Nodule  Sacroiliitis      PAST SURGICAL HISTORY:  Significant for left ulnar nerve repair surgery and surgery done for right parotid gland, hysterectomy, cholecystectomy, appendectomy.    Right Hemithyroidectomy on 04/05/19       [name] is a 63 y.o. female who presents with a chief complaint of   Chief Complaint   Patient presents with   . Follow Up 3 Months        The patient reports: complaint(s) of:  numbness or tingling  and pain in neck.     Past Medical History  Current Outpatient Medications   Medication Sig   . aspirin (ECOTRIN) 81 mg Oral Tablet, Delayed Release (E.C.) Take 81 mg by mouth Once a day   . Azelaic Acid 20 % Cream Apply topically Twice daily (Patient taking differently: Apply topically Twice per day as needed )   . buPROPion (WELLBUTRIN XL) 150 mg extended release 24 hr tablet Take 1 Tab (150 mg total) by mouth Once a day   . cholecalciferol, vitamin D3, (VITAMIN D) 25 mcg (1,000 unit) Oral Tablet Take 1 Tab (1,000 Units total) by mouth Once a day   . Clobetasol (TEMOVATE) 0.05 % Ointment Apply topically Twice daily To rash on hands and feet (Patient taking differently: Apply topically Twice per day as needed To rash on hands and feet)   . clobetasoL (TEMOVATE) 0.05 % Ointment Apply topically   . erythromycin (ROMYCIN) 5 mg/gram (0.5 %) Ophthalmic Ointment Instill into right eye Twice daily (Patient not taking: Reported on 07/19/2019)   . FLUoxetine (SARAFEM) 20 mg Oral Tablet Take 1 Tab (20 mg total) by mouth Twice daily   . fluticasone propionate (FLONASE) 50 mcg/actuation Nasal Spray, Suspension 1 Spray by Each Nostril route Once a day   . gabapentin (NEURONTIN) 600 mg Oral Tablet Take  1 Tab (600 mg total) by mouth Twice daily   . Halobetasol Propionate (ULTRAVATE) 0.05 % Cream Apply  topically. Three times a week (Patient taking differently: Twice per day as needed Apply  topically. Three times a week)   . hydrOXYzine HCL (ATARAX) 50 mg Oral Tablet TAKE 1 TABLET BY MOUTH EVERY DAY AT NIGHT (Patient taking differently: Take 50 mg by mouth Every evening )   . ipratropium-albuterol 0.5 mg-3 mg(2.5 mg base)/3 mL Solution for Nebulization 3 mL by Nebulization route Four times a day   . loratadine-pseudoephedrine (CLARITIN-D) 5-120 mg Oral Tablet Sustained Release 12 hr Take 1 Tab by mouth Twice daily   . methocarbamoL (ROBAXIN) 500 mg Oral Tablet Take 1 Tab (500 mg total) by mouth Four times a day For muscle spasms      . Metronidazole (METROCREAM) 0.75 % Cream Apply topically Twice daily (Patient taking differently: Apply topically Twice per day as needed )   . Metronidazole (METROCREAM) 0.75 % Cream Apply topically   . mineral oil-hydrophil petrolat (AQUAPHOR) Ointment Apply topically Once per day as needed    . montelukast (SINGULAIR) 10 mg Oral Tablet Take 1 Tab (10 mg total) by mouth Once a day   . nitroGLYCERIN (NITROSTAT) 0.4 mg Sublingual Tablet, Sublingual 1 Tab (0.4 mg total) by Sublingual route Every 5 minutes as needed for Chest pain for up to 3 doses for 3 doses over 15 minutes   . nystatin (NYSTOP) 100,000 unit/gram Powder Apply once or twice daily to PREVENT rash under breasts   . pantoprazole (PROTONIX) 40 mg Oral Tablet, Delayed Release (E.C.) Take 1 Tab (40 mg total) by mouth Every morning before breakfast   . tolterodine (DETROL) 2 mg Oral Tablet Take 2 mg by mouth Once a day   . traZODone (DESYREL) 50 mg Oral Tablet Take 1/2 to full tablet as needed for sleep. (Patient not taking: Reported on 07/19/2019)   . triamcinolone acetonide (ARISTOCORT A) 0.1 % Ointment by Apply Topically route Twice daily (Patient taking differently: by Apply Topically route Twice per day as needed )     Allergies   Allergen Reactions   . Cymbalta [Duloxetine]      Rash on feet and hands   . Blue Dye      Told to avoid due to patch test result by Simone Curia   . Flavoring Agent  Other Adverse Reaction (Add comment)     Patient unsure of reaction   . Lipitor [Atorvastatin] Myalgia     Muscle pain   . Nickel      Blisters  "Allergic to all kinds of metal"    . Codeine Itching     RASH   . Mobic [Meloxicam] Nausea/ Vomiting     Nervous and shakes    . Naprosyn [Naproxen] Nausea/ Vomiting   . Nsaids (Non-Steroidal Anti-Inflammatory Drug) Nausea/ Vomiting     Past Medical History:   Diagnosis Date   . Anxiety    . Arthritis    . Cancer (CMS HCC)     internal skin cancer   . Chronic pain     lower back   . Depression    . Dyspnea on exertion     . Essential hypertension 02/16/2017    patient denies   . GERD (gastroesophageal reflux disease)     controlled w/med   . Headache    . Heart murmur 1979    benign   . Hyperlipidemia LDL goal < 100 08/29/2013  denies   . MINOR CAD (coronary artery disease) 11/10/2012    no treatment other than cholesterol medications; follows regularly with Dr. Rodney Booze   . Multinodular goiter    . Muscle weakness     neck    . Obesity    . Peripheral edema    . Rash     face only eczema   . Rheumatic fever 16    no complications   . Squamous cell carcinoma 02/06/2015    right side of neck   . 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 Vaginal prolapse 2012    surgery improved significantly   . Wears glasses          Past Surgical History:   Procedure Laterality Date   . COLONOSCOPY  01/06/09    COLONOSCOPY performed by Loletta Parish F at [facility]   . GASTROSCOPY  03/05/2010    GASTROSCOPY performed by Jocelyn Lamer at [facility]   . GASTROSCOPY WITH BIOPSY  03/05/2010    GASTROSCOPY WITH BIOPSY performed by Jocelyn Lamer at [facility]   . HX ADENOIDECTOMY     . HX ANKLE FRACTURE [place]  2007    left distal fibula, casted   . HX APPENDECTOMY     . HX CHOLECYSTECTOMY     . HX COLONOSCOPY     . HX CYSTOCELE REPAIR  09/24/09   . HX HAND SURGERY  2012    for Carpal tunnel    . HX HEART CATHETERIZATION     . HX OOPHORECTOMY      left ovary removed   . HX PAROTIDECTOMY       "surgical clamp"    . HX PARTIAL THYROIDECTOMY Right 04/05/2019    R hemithyroidectomy for nodule, final path follicular adenoma   . HX TONSILLECTOMY     . HX TOTAL VAGINAL HYSTERECTOMY  1979   . HX WRIST FRACTURE [place]  2007    [place] injury   . PB REVISE ULNAR NERVE AT ELBOW Left 1979   . PB UPPER GI ENDOSCOPY,BIOPSY  12/19/07    patulous GE junction zone, erythema, nonerosive GERD         Family Medical History:     Problem Relation (Age of Onset)    Bipolar Disorder Daughter    Breast Cancer Paternal Aunt, Paternal Aunt, Paternal Aunt, Paternal Aunt, Paternal 48,  Other    Cancer Paternal Aunt, Paternal 72, Paternal 73, Paternal 66, Paternal 59, Other (22)    Congestive Heart Failure Father (45)    Coronary Artery Disease Father, Mother (66)    Diabetes Mother    Heart Attack Father, Sister    High Cholesterol Mother    Hypertension (High Blood Pressure) Mother, Brother, Sister, Sister    Kidney Disease Sister    Leukemia Paternal Uncle, Paternal Uncle    Stroke Paternal Grandmother    Thyroid Disease Sister            Social History     Socioeconomic History   . Marital status: Divorced     Spouse name: Not on file   . Number of children: Not on file   . Years of education: Not on file   . Highest education level: Not on file   Occupational History   . 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 Smoking status: Former Smoker  Packs/day: 2.00     Years: 40.00     Pack years: 80.00     Types: Cigarettes     Last attempt to quit: 01/22/2005     Years since quitting: 14.5   . Smokeless tobacco: Never Used   Substance and Sexual Activity   . Alcohol use: No     Alcohol/week: 0.0 standard drinks   . Drug use: No   . Sexual activity: Yes     Partners: Male   Other Topics Concern   . Uses Cane No   . Uses walker No   . Uses wheelchair No   . Right hand dominant Yes   . Left hand dominant No   . Ambidextrous No   . Ability to Walk 1 Flight of Steps without SOB/CP Yes   . Routine Exercise Yes     Comment: walking 1 time weekly for 2 hours    . Ability to Walk 2 Flight of Steps without SOB/CP No     Comment: shortness of breath, no cp    . Unable to Ambulate No   . Total Care No   . Ability To Do Own ADL's Yes   . Uses Walker No   . Other Activity Level Yes     Comment: housework    . Uses Cane No   . Shift Work No   . Unusual Sleep-Wake Schedule No       Review of Systems  General: no fever  Urinary: no dysuria  Neurological: headaches are stable  Respiratory: no dyspnea  Cardiovascular: no orthopnea or chest  pain  Endocrine: no polyuria or polydipsia  Hematological: no easy bruising or bleeding  Integument: no new rashes or lesions      Examination:    Vitals: BP (!) 142/88   Pulse 98   Ht 1.651 m (5\' 5" )   Wt 101 kg (221 lb 12.5 oz)   BMI 36.91 kg/m       General: appears in good health  HEENT:  Atraumatic, no pallor, no icterus.  NECK:  Significant tenderness with neck AROM/PROM in all directions; right neck edema  MUSCULOSKELETAL:  tenderness with knee/shoulder AROM/PROM  SKIN:  No rashes.  CARDIOVASCULAR: regular rhythm with no murmurs, gallops, or rubs  RESPIRATORY:  lungs clear to auscultation bilaterally  NEUROLOGIC: She is alert and oriented to person, place, and date.  Good recall.  Good registration.  Normal episodic, semantic, and procedural memory.  No hallucinations or delusions.  Mood was congruent. Speech is fluent and comprehensive. Fund of knowledge intact.  CRANIAL NERVES: Fundus:no disc pallor.Visual fields were normal. Pupils are equal and reactive to light. There is no INO no APD. Extraocular movements, normal full versions and ductions with normal pursuits and saccades. Normal facial sensation and strength in V2 and V3 but reduced sensation in V1 on the right.  No dysarthria.  Muscles of mastication normal.  Tongue movements were normal.  Normal hearing to finger rub. Cranial nerve 9 and 10 is intact gag reflex intact.Palate elevates in midline. Uvula in center.   Tone and bulk appear normal. Muscle Tone: Normal  COORDINATION:intact for finger to nose test, heel to shin test, tandem walk intact.  Muscle exam  Arm Right Left Leg Right Left   Deltoid 5/5 5/5 Iliopsoas 5/5 5/5   Biceps 5/5 5/5 Quads 5/5 5/5   Triceps 5/5 5/5 Hamstrings 5/5 5/5   Wrist Extension 5/5 5/5 Ankle Dorsi Flexion 5/5 5/5   Wrist Flexion 5/5  5/5 Ankle Plantar Flexion 5/5 5/5   Interossei 4/5 5/5 Ankle Eversion 5/5 5/5   APB 4/5 5/5 Ankle Inversion 5/5 5/5       Reflexes   RJ BJ TJ KJ AJ Plantars Hoffman's   Right 2+ 2+  2+ 3+ 2+ downgoing Not present   Left 2+ 2+ 2+ 3+ 2+ downgoing Not present   Sensory examination was intact for light touch, pinprick.vibration sensation and position sense was intact.        Assessment and Plan    1. Right arm pain    2. Cervical radiculopathy at C5    3. Neck pain    4. 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    ASSESSMENT AND PLAN:  [name] is a 63 year old right-handed Caucasian woman with a significant history of parotid gland tumor s/p surgery done and history of depression.  She comes for follow up visit  for  pain in the right forearm  associated with swelling.  These symptoms have been ongoing for over a year. MRI imaging of the cervical spine, which was done  which showed disk bulge at C5-C6-C6-C7 indenting the thecal sac without significant cord impingement. As per neurosurgery recommendation to see if physical therapy improves her symptoms if not then she willl get ACDF cervical spine fusion surgery. She was advised to follow up with neurosurgery.    Continue physical therapy and Neurontin 600mg  bid.    Follow up with Neurology in 1 year    Dayna Barker MS3  Total face-to-face time by staff:77minutes. Greater than 50% of that time (61minutes)was spent on counseling/coordination of care regarding:ceevical radiculopathy,periepheral neuropathy,compelx regional pain syndrome,tenosynovisits,treatment ,counselling,mri imaging study    Barbara Sloniker,MD

## 2019-08-24 ENCOUNTER — Ambulatory Visit: Payer: MEDICAID | Attending: Otolaryngology | Admitting: Otolaryngology

## 2019-08-24 ENCOUNTER — Encounter (HOSPITAL_BASED_OUTPATIENT_CLINIC_OR_DEPARTMENT_OTHER): Payer: Self-pay

## 2019-08-24 ENCOUNTER — Encounter (HOSPITAL_BASED_OUTPATIENT_CLINIC_OR_DEPARTMENT_OTHER): Payer: Self-pay | Admitting: Otolaryngology

## 2019-08-24 ENCOUNTER — Ambulatory Visit (HOSPITAL_BASED_OUTPATIENT_CLINIC_OR_DEPARTMENT_OTHER)
Admission: RE | Admit: 2019-08-24 | Discharge: 2019-08-24 | Disposition: A | Discharge status: Home / Self Care | Payer: MEDICAID | Source: Ambulatory Visit

## 2019-08-24 ENCOUNTER — Other Ambulatory Visit: Payer: Self-pay

## 2019-08-24 VITALS — BP 148/92 | HR 96 | Temp 97.0°F | Resp 18 | Ht 65.0 in | Wt 221.3 lb

## 2019-08-24 DIAGNOSIS — Z7982 Long term (current) use of aspirin: Secondary | ICD-10-CM | POA: Insufficient documentation

## 2019-08-24 DIAGNOSIS — Z4800 Encounter for change or removal of nonsurgical wound dressing: Secondary | ICD-10-CM | POA: Insufficient documentation

## 2019-08-24 DIAGNOSIS — Z9889 Other specified postprocedural states: Secondary | ICD-10-CM | POA: Insufficient documentation

## 2019-08-24 DIAGNOSIS — E785 Hyperlipidemia, unspecified: Secondary | ICD-10-CM | POA: Insufficient documentation

## 2019-08-24 DIAGNOSIS — D34 Benign neoplasm of thyroid gland: Secondary | ICD-10-CM | POA: Insufficient documentation

## 2019-08-24 DIAGNOSIS — M542 Cervicalgia: Secondary | ICD-10-CM | POA: Insufficient documentation

## 2019-08-24 DIAGNOSIS — R5383 Other fatigue: Secondary | ICD-10-CM

## 2019-08-24 DIAGNOSIS — Z79899 Other long term (current) drug therapy: Secondary | ICD-10-CM | POA: Insufficient documentation

## 2019-08-24 DIAGNOSIS — Z09 Encounter for follow-up examination after completed treatment for conditions other than malignant neoplasm: Secondary | ICD-10-CM

## 2019-08-24 DIAGNOSIS — Z803 Family history of malignant neoplasm of breast: Secondary | ICD-10-CM | POA: Insufficient documentation

## 2019-08-24 DIAGNOSIS — K219 Gastro-esophageal reflux disease without esophagitis: Secondary | ICD-10-CM | POA: Insufficient documentation

## 2019-08-24 DIAGNOSIS — Z9009 Acquired absence of other part of head and neck: Secondary | ICD-10-CM

## 2019-08-24 DIAGNOSIS — E041 Nontoxic single thyroid nodule: Secondary | ICD-10-CM

## 2019-08-24 DIAGNOSIS — I10 Essential (primary) hypertension: Secondary | ICD-10-CM | POA: Insufficient documentation

## 2019-08-24 DIAGNOSIS — E890 Postprocedural hypothyroidism: Secondary | ICD-10-CM

## 2019-08-24 DIAGNOSIS — F419 Anxiety disorder, unspecified: Secondary | ICD-10-CM | POA: Insufficient documentation

## 2019-08-24 DIAGNOSIS — F329 Major depressive disorder, single episode, unspecified: Secondary | ICD-10-CM | POA: Insufficient documentation

## 2019-08-24 LAB — THYROID STIMULATING HORMONE WITH FREE T4 REFLEX: TSH: 2.354 u[IU]/mL (ref 0.350–5.000)

## 2019-08-24 NOTE — Progress Notes (Signed)
[facility], [facility]  [place] [address]  [phone]    Patient Name: [name]   MRN:  [id number]   DOB: 05/03/1956   Date of Service:  08/24/2019     ENT HEAD AND NECK ONCOLOGY SURGERY AND RECONSTRUCTION     FOLLOW UP NOTE     HPI: This is a 63 y.o. female who presents for follow-up. The patient underwent a hemithyroidectomy 04/06/2019. Her post-operative course was complicated by a post-operative infection that was initially treated with Augmentin and improved. She later returned with a fluid collection and purulence expressed from the incision. The patient was sent home on antibiotics and wound packing instructions.     She comes in today for follow-up and with an ultarsound. . Struggling with neck pain.  She saw a neurosurgeon for this and was recommended physical therapy.  Unfortunately, she was unable to the go through the physical therapy, since she was also told to have a lifting restriction.  She is having pain along her right neck, where she had her prior parotidectomy for a pleomorphic adenoma.  She is not currently on any thyroid hormone.        Past Medical History:  Past Medical History:   Diagnosis Date   . Anxiety    . Arthritis    . Cancer (CMS HCC)     internal skin cancer   . Chronic pain     lower back   . Depression    . Dyspnea on exertion    . Essential hypertension 02/16/2017    patient denies   . GERD (gastroesophageal reflux disease)     controlled w/med   . Headache    . Heart murmur 1979    benign   . Hyperlipidemia LDL goal < 100 08/29/2013    denies   . MINOR CAD (coronary artery disease) 11/10/2012    no treatment other than cholesterol medications; follows regularly with Dr. Rodney Booze   . Multinodular goiter    . Muscle weakness     neck    . Obesity    . Peripheral edema    . Rash     face only eczema   . Rheumatic fever 16    no complications   . Squamous cell carcinoma 02/06/2015    right side of neck   . 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 Vaginal prolapse 2012    surgery improved significantly   . Wears glasses            Past Surgical History:  Past Surgical History:   Procedure Laterality Date   . COLONOSCOPY  01/06/09    COLONOSCOPY performed by Loletta Parish F at [facility]   . GASTROSCOPY  03/05/2010    GASTROSCOPY performed by Jocelyn Lamer at [facility]   . GASTROSCOPY WITH BIOPSY  03/05/2010    GASTROSCOPY WITH BIOPSY performed by Jocelyn Lamer at [facility]   . HX ADENOIDECTOMY     . HX ANKLE FRACTURE [place]  2007    left distal fibula, casted   . HX APPENDECTOMY     . HX CHOLECYSTECTOMY     . HX COLONOSCOPY     . HX CYSTOCELE REPAIR  09/24/09   . HX HAND SURGERY  2012    for Carpal tunnel    . HX HEART CATHETERIZATION     . HX OOPHORECTOMY  left ovary removed   . HX PAROTIDECTOMY       "surgical clamp"    . HX PARTIAL THYROIDECTOMY Right 04/05/2019    R hemithyroidectomy for nodule, final path follicular adenoma   . HX TONSILLECTOMY     . HX TOTAL VAGINAL HYSTERECTOMY  1979   . HX WRIST FRACTURE [place]  2007    [place] injury   . PB REVISE ULNAR NERVE AT ELBOW Left 1979   . PB UPPER GI ENDOSCOPY,BIOPSY  12/19/07    patulous GE junction zone, erythema, nonerosive GERD           Family History: Patient's family history was reviewed in writing and with the patient with findings as noted below. Unless otherwise noted, patient has no family history of similar problems.   Family Medical History:     Problem Relation (Age of Onset)    Bipolar Disorder Daughter    Breast Cancer Paternal Aunt, Paternal Aunt, Paternal Aunt, Paternal 33, Paternal 63, Other    Cancer Paternal Aunt, Paternal 74, Paternal 17, Paternal 11, Paternal 82, Other (55)    Congestive Heart Failure Father (78)    Coronary Artery Disease Father, Mother (45)    Diabetes Mother    Heart Attack Father, Sister    High Cholesterol Mother    Hypertension (High Blood Pressure) Mother, Brother, Sister, Sister    Kidney Disease Sister    Leukemia Paternal  Uncle, Paternal Uncle    Stroke Paternal Grandmother    Thyroid Disease Sister               MEDICATIONS:    Current Outpatient Medications:   .  aspirin (ECOTRIN) 81 mg Oral Tablet, Delayed Release (E.C.), Take 81 mg by mouth Once a day, Disp: , Rfl:   .  Azelaic Acid 20 % Cream, Apply topically Twice daily (Patient taking differently: Apply topically Twice per day as needed ), Disp: 50 g, Rfl: 0  .  buPROPion (WELLBUTRIN XL) 150 mg extended release 24 hr tablet, Take 1 Tab (150 mg total) by mouth Once a day, Disp: 30 Tab, Rfl: 2  .  cholecalciferol, vitamin D3, (VITAMIN D) 25 mcg (1,000 unit) Oral Tablet, Take 1 Tab (1,000 Units total) by mouth Once a day, Disp: 30 Tab, Rfl: 3  .  Clobetasol (TEMOVATE) 0.05 % Ointment, Apply topically Twice daily To rash on hands and feet (Patient taking differently: Apply topically Twice per day as needed To rash on hands and feet), Disp: 60 g, Rfl: 0  .  clobetasoL (TEMOVATE) 0.05 % Ointment, Apply topically, Disp: , Rfl:   .  erythromycin (ROMYCIN) 5 mg/gram (0.5 %) Ophthalmic Ointment, Instill into right eye Twice daily (Patient not taking: Reported on 07/19/2019), Disp: 1 g, Rfl: 0  .  FLUoxetine (SARAFEM) 20 mg Oral Tablet, Take 1 Tab (20 mg total) by mouth Twice daily, Disp: 60 Tab, Rfl: 1  .  fluticasone propionate (FLONASE) 50 mcg/actuation Nasal Spray, Suspension, 1 Spray by Each Nostril route Once a day, Disp: 16 g, Rfl: 3  .  gabapentin (NEURONTIN) 600 mg Oral Tablet, Take 1 Tab (600 mg total) by mouth Twice daily, Disp: 60 Tab, Rfl: 4  .  Halobetasol Propionate (ULTRAVATE) 0.05 % Cream, Apply  topically. Three times a week (Patient taking differently: Twice per day as needed Apply  topically. Three times a week), Disp: 15 g, Rfl: 2  .  hydrOXYzine HCL (ATARAX) 50 mg Oral Tablet, TAKE 1 TABLET BY MOUTH EVERY DAY  AT NIGHT (Patient taking differently: Take 50 mg by mouth Every evening ), Disp: 30 Tab, Rfl: 1  .  ipratropium-albuterol 0.5 mg-3 mg(2.5 mg base)/3 mL Solution  for Nebulization, 3 mL by Nebulization route Four times a day, Disp: 3 mL, Rfl: 2  .  loratadine-pseudoephedrine (CLARITIN-D) 5-120 mg Oral Tablet Sustained Release 12 hr, Take 1 Tab by mouth Twice daily, Disp: 30 Tab, Rfl: 1  .  methocarbamoL (ROBAXIN) 500 mg Oral Tablet, Take 1 Tab (500 mg total) by mouth Four times a day For muscle spasms, Disp: 60 Tab, Rfl: 0  .  Metronidazole (METROCREAM) 0.75 % Cream, Apply topically Twice daily (Patient taking differently: Apply topically Twice per day as needed ), Disp: 45 g, Rfl: 1  .  Metronidazole (METROCREAM) 0.75 % Cream, Apply topically, Disp: , Rfl:   .  mineral oil-hydrophil petrolat (AQUAPHOR) Ointment, Apply topically Once per day as needed , Disp: , Rfl:   .  montelukast (SINGULAIR) 10 mg Oral Tablet, Take 1 Tab (10 mg total) by mouth Once a day, Disp: 30 Tab, Rfl: 3  .  nitroGLYCERIN (NITROSTAT) 0.4 mg Sublingual Tablet, Sublingual, 1 Tab (0.4 mg total) by Sublingual route Every 5 minutes as needed for Chest pain for up to 3 doses for 3 doses over 15 minutes, Disp: 20 Tab, Rfl: 0  .  nystatin (NYSTOP) 100,000 unit/gram Powder, Apply once or twice daily to PREVENT rash under breasts, Disp: 60 g, Rfl: 11  .  pantoprazole (PROTONIX) 40 mg Oral Tablet, Delayed Release (E.C.), Take 1 Tab (40 mg total) by mouth Every morning before breakfast, Disp: 30 Tab, Rfl: 5  .  tolterodine (DETROL) 2 mg Oral Tablet, Take 2 mg by mouth Once a day, Disp: , Rfl:   .  traZODone (DESYREL) 50 mg Oral Tablet, Take 1/2 to full tablet as needed for sleep. (Patient not taking: Reported on 07/19/2019), Disp: 30 Tab, Rfl: 3  .  triamcinolone acetonide (ARISTOCORT A) 0.1 % Ointment, by Apply Topically route Twice daily (Patient taking differently: by Apply Topically route Twice per day as needed ), Disp: 80 g, Rfl: 2     ALLERGIES:  Allergy History as of 08/24/19     CODEINE       Noted Status Severity Type Reaction    12/10/15 0941 Pantojas, [place]  Active Low  Itching    Comments:  RASH      09/19/13 1430 Wallace Cullens Joy  Deleted Low  Itching    Comments:  RASH     09/04/09 Ginette Pitman  Active   Itching    Comments:  RASH     10/10/07   Active       Comments:  RASH           STRAWBERRY       Noted Status Severity Type Reaction    03/18/11 1634 Johnnette Barrios  Deleted       10/10/08 [name], MD  Deleted       10/10/07   Active             MELOXICAM       Noted Status Severity Type Reaction    11/24/18 0857 Kisner, Bishop Limbo, RN 06/26/09 Active Low  Nausea/ Vomiting    Comments:  Nervous and shakes      12/10/15 0941 Pantojas, Kristy 06/26/09 Active Low  Nausea/ Vomiting    06/26/09 Waldon Reining 06/26/09 Active   Nausea/ Vomiting  NICKEL       Noted Status Severity Type Reaction    07/19/19 0959 Linnell Fulling, RN 07/24/12 Active  Topical     Comments:  Blisters  "Allergic to all kinds of metal"      11/24/18 0856 Kisner, Bishop Limbo, RN 07/24/12 Active  Topical     Comments:  Blisters       07/24/12 1041 [name], MD 07/24/12 Active  Topical           BLUE DYE       Noted Status Severity Type Reaction    07/24/12 1041 [name], MD 07/24/12 Active  Topical     Comments:  Told to avoid due to patch test result by [facility] [place]           DULOXETINE       Noted Status Severity Type Reaction    07/24/12 1052 [name], MD 07/24/12 Active Medium Systemic     Comments:  Rash on feet and hands           NAPROXEN       Noted Status Severity Type Reaction    11/01/12 0913 Sanjuana Letters, MA 11/01/12 Active Low  Nausea/ Vomiting          ATORVASTATIN       Noted Status Severity Type Reaction    06/19/18 2149 Erskine Squibb, RX STUDENT 01/27/15 Active  Side Effect Myalgia    Comments:  Muscle pain     01/27/15 1035 [name], MD 01/27/15 Active  Side Effect     Comments:  Muscle pain           FLAVORING AGENT       Noted Status Severity Type Reaction    07/19/19 0958 Linnell Fulling, RN 05/03/17 Active    Other Adverse  Reaction (Add comment)    Comments:  Patient unsure of reaction     04/03/18 1905 Suella Grove, MA 05/03/17 Active             NSAIDS (NON-STEROIDAL ANTI-INFLAMMATORY DRUG)       Noted Status Severity Type Reaction    11/24/18 0857 Kisner, Bishop Limbo, RN 05/03/17 Active Low  Nausea/ Vomiting    04/03/18 1905 Suella Grove, MA 05/03/17 Active                    SOCIAL HISTORY:  Social History     Socioeconomic History   . Marital status: Divorced     Spouse name: Not on file   . Number of children: Not on file   . Years of education: Not on file   . Highest education level: Not on file   Occupational History   . 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 Financial resource strain: Not on file   . Food insecurity     Worry: Not on file     Inability: Not on file   . Transportation needs     Medical: Not on file     Non-medical: Not on file   Tobacco Use   . Smoking status: Former Smoker     Packs/day: 2.00     Years: 40.00     Pack years: 80.00     Types: Cigarettes     Last attempt to quit: 01/22/2005     Years since quitting: 14.5   . Smokeless  tobacco: Never Used   Substance and Sexual Activity   . Alcohol use: No     Alcohol/week: 0.0 standard drinks   . Drug use: No   . Sexual activity: Yes     Partners: Male   Lifestyle   . Physical activity     Days per week: Not on file     Minutes per session: Not on file   . Stress: Not on file   Relationships   . Social Product manager on phone: Not on file     Gets together: Not on file     Attends religious service: Not on file     Active member of club or organization: Not on file     Attends meetings of clubs or organizations: Not on file     Relationship status: Not on file   . Intimate partner violence     Fear of current or ex partner: Not on file     Emotionally abused: Not on file     Physically abused: Not on file     Forced sexual activity: Not on file   Other Topics Concern   .  Abuse/Domestic Violence Not Asked   . Breast Self Exam Not Asked   . Caffeine Concern Not Asked   . Calcium intake adequate Not Asked   . Computer Use Not Asked   . Exercise Concern Not Asked   . Helmet Use Not Asked   . Seat Belt Not Asked   . Special Diet Not Asked   . Sunscreen used Not Asked   . Uses Cane No   . Uses walker No   . Uses wheelchair No   . Right hand dominant Yes   . Left hand dominant No   . Ambidextrous No   . Ability to Walk 1 Flight of Steps without SOB/CP Yes   . Routine Exercise Yes     Comment: walking 1 time weekly for 2 hours    . Ability to Walk 2 Flight of Steps without SOB/CP No     Comment: shortness of breath, no cp    . Unable to Ambulate No   . Total Care No   . Ability To Do Own ADL's Yes   . Uses Walker No   . Other Activity Level Yes     Comment: housework    . Uses Cane No   . Drives Not Asked   . Shift Work No   . Unusual Sleep-Wake Schedule No   Social History Narrative   . Not on file         PHYSICAL EXAM:  BP (!) 148/92   Pulse 96   Temp 36.1 C (97 F) (Thermal Scan)   Resp 18   Ht 1.651 m (5\' 5" )   Wt 100 kg (221 lb 5.5 oz)   BMI 36.83 kg/m       Body mass index is 36.83 kg/m.  General Appearance: Pleasant, cooperative, healthy, and in no acute distress.  Eyes: Conjunctivae/corneas clear, EOM's intact.  Ears: Normal shape and position. Clear TM bilaterally.   Head and Face: Normocephalic, atraumatic.  Face symmetric, no obvious lesions.   Ears: Normal shape and position.   Nose:  External pyramid midline. Septum midline.   Neck: Anterior neck well healed. Tenderness along SCM on the right. Incision well healed.   Cardiovascular:  Good perfusion of upper extremities.  No cyanosis of the hands or fingers.  Lungs: No apparent  stridorous breathing. No acute distress.  Neurologic: Cranial nerves: grossly intact.  Psychiatric:  Alert and oriented x 3.     IMAGING: Thyroid ultrasound reviewed.  No obvious nodularity.  Official radiology read is  pending.      ASSESSMENT/PLAN: Patient comes in for follow-up for right hemithyroidectomy and post-operative wound infection.She has been doing well and her ultrasound today was reassuring.  I discussed the patient's neck pain with her.  I do not feel like this is related to her prior parotidectomy, though I would like for her to monitor this.  I will check in again when I call her with the ultrasound results.  In the meantime, I told her I would discuss the physical therapy issue with her neurosurgery team and see if she can get started.  The patient understood.      Delfino Lovett, MD

## 2019-08-25 ENCOUNTER — Encounter (HOSPITAL_BASED_OUTPATIENT_CLINIC_OR_DEPARTMENT_OTHER): Payer: Self-pay | Admitting: Otolaryngology

## 2019-08-26 ENCOUNTER — Other Ambulatory Visit (HOSPITAL_COMMUNITY): Payer: Self-pay | Admitting: Student in an Organized Health Care Education/Training Program

## 2019-08-26 ENCOUNTER — Other Ambulatory Visit (HOSPITAL_BASED_OUTPATIENT_CLINIC_OR_DEPARTMENT_OTHER): Payer: Self-pay | Admitting: Family Medicine

## 2019-08-26 DIAGNOSIS — F331 Major depressive disorder, recurrent, moderate: Secondary | ICD-10-CM

## 2019-08-26 DIAGNOSIS — E559 Vitamin D deficiency, unspecified: Secondary | ICD-10-CM

## 2019-10-27 ENCOUNTER — Encounter (HOSPITAL_BASED_OUTPATIENT_CLINIC_OR_DEPARTMENT_OTHER): Payer: Self-pay | Admitting: Family Medicine

## 2019-10-29 ENCOUNTER — Telehealth: Payer: MEDICAID | Admitting: Psychiatry

## 2019-10-29 ENCOUNTER — Ambulatory Visit: Payer: MEDICAID | Attending: Family Medicine | Admitting: Family Medicine

## 2019-10-29 ENCOUNTER — Encounter (HOSPITAL_BASED_OUTPATIENT_CLINIC_OR_DEPARTMENT_OTHER): Payer: Self-pay | Admitting: Family Medicine

## 2019-10-29 ENCOUNTER — Other Ambulatory Visit: Payer: Self-pay

## 2019-10-29 DIAGNOSIS — F4310 Post-traumatic stress disorder, unspecified: Secondary | ICD-10-CM

## 2019-10-29 DIAGNOSIS — F419 Anxiety disorder, unspecified: Secondary | ICD-10-CM

## 2019-10-29 DIAGNOSIS — G4700 Insomnia, unspecified: Secondary | ICD-10-CM

## 2019-10-29 DIAGNOSIS — J329 Chronic sinusitis, unspecified: Secondary | ICD-10-CM | POA: Insufficient documentation

## 2019-10-29 DIAGNOSIS — N904 Leukoplakia of vulva: Secondary | ICD-10-CM

## 2019-10-29 DIAGNOSIS — R918 Other nonspecific abnormal finding of lung field: Secondary | ICD-10-CM

## 2019-10-29 MED ORDER — HALOBETASOL PROPIONATE 0.05 % TOPICAL CREAM
TOPICAL_CREAM | Freq: Two times a day (BID) | CUTANEOUS | 1 refills | Status: AC | PRN
Start: 2019-10-29 — End: ?

## 2019-10-29 MED ORDER — AMOXICILLIN 875 MG-POTASSIUM CLAVULANATE 125 MG TABLET
1.00 | ORAL_TABLET | Freq: Two times a day (BID) | ORAL | 0 refills | Status: AC
Start: 2019-10-29 — End: 2019-11-08

## 2019-10-29 MED ORDER — MIRTAZAPINE 7.5 MG TABLET
7.50 mg | ORAL_TABLET | Freq: Every evening | ORAL | 1 refills | Status: DC
Start: 2019-10-29 — End: 2019-12-24

## 2019-10-29 NOTE — Progress Notes (Signed)
[facility]  Department of Behavioral Medicine and Psychiatry    Outpatient Progress Note      Tacia Vargason   [id number]  1956-06-25    DOS: 10/29/2019      TELEPHONE VISIT      I personally offered the service to the patient, and obtained verbal consent to provide this service.    Jasmine Pang, MD    15 minutes on the phone      TELEMEDICINE DOCUMENTATION:      Patient Location:  Phone visit from home address: [address]  [place] [address]    Patient/family aware of provider location:  yes  Patient/family consent for telemedicine:  yes  Examination observed and performed by:  Jasmine Pang, MD      Chief complaint:   Chief Complaint   Patient presents with   . Fatigue   . Sleep Problem        ID:  Kassidi Decasas is a 64 y.o. 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    Subjective:  Patient presents alone for a follow-up visit for management of  depression.    At last visit, we discussed problems with low energy.  Wellbutrin was started.  Patient does note some improvement but still has problem sleeping at night.  She was taking Atarax for this.  We discussed side effects associated with long-term use of Atarax and agreed to switch to Remeron.  Side effects discussed.      Reported side effects: no insomnia, no changes in appetite or weight, no galactorrhea or EPS, no tremor, no rash     Collateral obtained/reviewed:     Medical Hx: no changes since last  visit      Social Hx: no changes since last visit  Previous medication trials:    -     Current Medications:  Current Outpatient Medications   Medication Sig   . amoxicillin-pot clavulanate (AUGMENTIN) 875-125 mg Oral Tablet Take 1 Tab by mouth Twice daily for 10 days   . aspirin (ECOTRIN) 81 mg Oral Tablet, Delayed Release (E.C.) Take 81 mg by mouth Once a day   . Azelaic Acid 20 % Cream Apply topically Twice daily (Patient taking differently: Apply topically Twice per day as needed )   . buPROPion (WELLBUTRIN XL) 150 mg extended release 24 hr tablet Take 1 Tab (150 mg total) by mouth Once a day   . Clobetasol (TEMOVATE) 0.05 % Ointment Apply topically Twice daily To rash on hands and feet (Patient taking differently: Apply topically Twice per day as needed To rash on hands and feet)   . clobetasoL (TEMOVATE) 0.05 % Ointment Apply topically   . erythromycin (ROMYCIN) 5 mg/gram (0.5 %) Ophthalmic Ointment Instill into right eye Twice daily (Patient not taking: Reported on 07/19/2019)   . FLUoxetine 20 mg Oral Tablet TAKE 1 TABLET BY MOUTH TWICE A DAY   . fluticasone propionate (FLONASE) 50 mcg/actuation Nasal Spray, Suspension 1 Spray by Each Nostril route Once a day   . gabapentin (NEURONTIN) 600 mg Oral Tablet Take 1 Tab (600  mg total) by mouth Twice daily   . Halobetasol Propionate (ULTRAVATE) 0.05 % Cream Apply topically Twice per day as needed Apply  topically. Three times a week   . hydrOXYzine HCL (ATARAX) 50 mg Oral Tablet TAKE 1 TABLET BY MOUTH EVERY DAY AT NIGHT (Patient taking differently: Take 50 mg by mouth Every evening )   . ipratropium-albuterol 0.5 mg-3 mg(2.5 mg base)/3 mL Solution for Nebulization 3 mL by Nebulization route Four times a day   . loratadine-pseudoephedrine (CLARITIN-D) 5-120 mg Oral Tablet Sustained Release 12 hr Take 1 Tab by mouth Twice daily   . methocarbamoL (ROBAXIN) 500 mg Oral Tablet Take 1 Tab (500 mg total) by mouth Four times a day For muscle spasms   . Metronidazole (METROCREAM)  0.75 % Cream Apply topically Twice daily (Patient taking differently: Apply topically Twice per day as needed )   . Metronidazole (METROCREAM) 0.75 % Cream Apply topically   . mineral oil-hydrophil petrolat (AQUAPHOR) Ointment Apply topically Once per day as needed    . Mirtazapine (REMERON) 7.5 mg Oral Tablet Take 1 Tab (7.5 mg total) by mouth Every night   . montelukast (SINGULAIR) 10 mg Oral Tablet Take 1 Tab (10 mg total) by mouth Once a day   . nitroGLYCERIN (NITROSTAT) 0.4 mg Sublingual Tablet, Sublingual 1 Tab (0.4 mg total) by Sublingual route Every 5 minutes as needed for Chest pain for up to 3 doses for 3 doses over 15 minutes   . nystatin (NYSTOP) 100,000 unit/gram Powder Apply once or twice daily to PREVENT rash under breasts   . pantoprazole (PROTONIX) 40 mg Oral Tablet, Delayed Release (E.C.) TAKE 1 TAB (40 MG TOTAL) BY MOUTH EVERY MORNING BEFORE BREAKFAST   . tolterodine (DETROL) 2 mg Oral Tablet Take 2 mg by mouth Once a day   . traZODone (DESYREL) 50 mg Oral Tablet Take 1/2 to full tablet as needed for sleep. (Patient not taking: Reported on 07/19/2019)   . triamcinolone acetonide (ARISTOCORT A) 0.1 % Ointment by Apply Topically route Twice daily (Patient taking differently: by Apply Topically route Twice per day as needed )   . VITAMIN D 25 mcg (1,000 unit) Oral Tablet TAKE 1 TABLET BY MOUTH ONCE A DAY       Review of systems:   Constitutional: no insomnia, no changes in appetite, no weight loss/gain  Cardiac: no chest pain/palpitations  Resp: no shortness of breath  GI: no n/v/d/c  GU: no urinary retention, no incontinence   MSK: no arthralgias/myalgias, no rigidity, no tremulousness, no abnormal movements   Neuro: no headaches, no blurry vision, no balance issues, no falls    Physical Exam:   There were no vitals filed for this visit.    There is no height or weight on file to calculate BMI.    none      Mental Status Exam:  This is an alert  and grossly oriented 64 y.o. female  and she is  cooperative and pleasant. Speech is regular rate and volume, clear and articulate. Mood is sad. Thought process is goal-directed with appropriate thought content. Denies SI/HI. No hallucinations, delusions, or paranoia voiced or suspected. Attention is adequate for the conversation and she displays a fair ability to think abstractly. Judgement and insight is fair.       Assessment:  This is a 64 y.o. female with a history of depression and PTSD presenting for follow-up.  Patient was staying with a sister in [place] but recently was able to move  into her own apartment in [place] with HUD assistance.  She receives SSI disability.  She lives by herself.  She takes Prozac 40 mg daily for management of depression.  She has had depression since age 89 with multiple episodes of depression.  She has had SI but no attempts.  One hospitalization.  Current stressors include finances and health problems.  Sleep has worsened with difficult staying asleep that responded to atarax..  She has had a sleep study over 10 years ago but may need another.    Will energy responded to Wellbutrin.  While sleep has responded to Atarax was switched to remeron.      Diagnosis:  ENCOUNTER DIAGNOSES     ICD-10-CM   1. 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 PTSD (post-traumatic stress disorder)  F43.10   3. Anxiety  F41.9       Plan:  1. Continue fluoxetine 40 mg daily - she will utilize the tablets without blue dye due to an allergy.  1. Taken as 2 20 tabs daily.  2. Start Wellbutrin XL 150 mg daily for low energy  3. Stop atarax  4. Start remeron 7.5 for sleep problem    Medication Orders   Medications   . Mirtazapine (REMERON) 7.5 mg Oral Tablet     Sig: Take 1 Tab (7.5 mg total) by mouth Every night     Dispense:  30 Tab     Refill:  1       Laboratory Studies:  None    The[facility]     Safety: Low risk for imminent harm to self.    Follow up: Return to clinic in 3 months . Patient advised to call in the  interim with questions/concerns and to go to the ER if safety concerns arise.       Late Entry for 10/29/2019.      Jasmine Pang, MD  [facility]  Department of Behavioral Medicine and Psychiatry  10/30/2019       Late Entry for 10/29/2019.  I spoke with the patient on the telephone from 15:55 to 16:00 for a total of 5 minutes, along with the resident.  The telephone visit was conducted secondary to the pandemic response to COVID-19.  I was present and participated in the development of the treatment plan. I reviewed the resident's note. I agree with the findings and plan of care, as documented in the resident's note.  Any exceptions/ additions are edited/noted.    Indigo Barbian N. Jeannetta Ellis, M.D.   Staff Psychiatrist  Orpah Hausner Meribeth Mattes, MD  10/31/2019, 10:41

## 2019-10-29 NOTE — Progress Notes (Signed)
FAMILY MEDICINE, [place] [address]  Operated by [place]  Telephone Visit    Name:  [name] MRN: [id number]   Date:  10/29/2019 Age:   64 y.o.     The patient/family initiated a request for telephone service.  Verbal consent for this service was obtained from the patient/family.    Last office visit in this department: 04/04/2019      Reason for call: sinus pain  Call notes:  Patient states she has had sinus pain and pressure for over 1 week. This feels like her usual sinus headaches that she has. Having lots of rhinorrhea with occasional streaks of blood after Flonase use.    Patient also requests CT chest ordered for annual pulmonary nodule follow up.    She has history of Lichen Sclerosis that responded well to Halobetasol cream. She was given betamethasone instead due to insurance coverage. Her symptoms have persisted and worsened with betamethasone and is requesting to go back to Halobetasol.      ICD-10-CM    1. Pulmonary nodules  R91.8 CT CHEST WO IV CONTRAST   2. Sinusitis, unspecified chronicity, unspecified location  J32.9 amoxicillin-pot clavulanate (AUGMENTIN) 875-125 mg Oral Tablet   3. Lichen sclerosus et atrophicus of the vulva  N90.4 Halobetasol Propionate (ULTRAVATE) 0.05 % Cream       Total provider time spent with the patient on the phone: 10 minutes.    Dennison Mascot, MD    I provided Direct Supervision during this virtual medicine encounter by concurrently monitoring patient care through the EHR and appropriate telecommunication technology, reviewing the documentation from the encounter upon its completion, and discussing the details of the encounter with the resident. Please see resident documentation for details.  Verline Lema, MD  10/29/2019, 15:56

## 2019-10-31 ENCOUNTER — Telehealth (HOSPITAL_BASED_OUTPATIENT_CLINIC_OR_DEPARTMENT_OTHER): Payer: Self-pay | Admitting: Family Medicine

## 2019-10-31 NOTE — Telephone Encounter (Signed)
PA required by insurance for [facility] for Lichen Sclerosis.    Patient had prior multi week trial of Clobetasol in 2013, 2014 and again in 2015.  She tried Betamethasone for several weeks in 2018.  Trial of Triamcinolone in 2016 for several weeks.    All trials of Clobetasol, Betamethasone and Triamcinolone were inadequate and patient was continuously placed back on Halobetasol.    As patient has proven she responds to [facility] and does not respond to alternate insurance preferred therapy trialed off and on over several years due to insurance preference, it is my request as her physician to be able to continue Halobetasol without further trials hindering patient care.    Dennison Mascot, MD  [facility]  Department of [facility] Medicine  PGY-3

## 2019-10-31 NOTE — Telephone Encounter (Signed)
PA needed for Halobetasol  ID: [id number]  BIN: [id number]  PCN:    Non-preferred agents require five (5) day trials of one (1) form of EACH preferred unique active ingredient in the corresponding potency  group before they will be approved    Preferred agents:  betamethasone dipropionate cream  betamethasone valerate cream  betamethasone valerate lotion  betamethasone valerate oint  clobetasol propionate  cream/gel/ointment/solution  clobetasol emollient  clobetasol propionate shampoo  fluocinonide gel  triamcinolone acetonide cream, ointment  triamcinolone acetonide lotion

## 2019-11-02 ENCOUNTER — Other Ambulatory Visit (HOSPITAL_COMMUNITY): Payer: Self-pay | Admitting: Psychiatry

## 2019-11-05 ENCOUNTER — Other Ambulatory Visit (HOSPITAL_BASED_OUTPATIENT_CLINIC_OR_DEPARTMENT_OTHER): Payer: Self-pay | Admitting: Family Medicine

## 2019-11-05 DIAGNOSIS — J309 Allergic rhinitis, unspecified: Secondary | ICD-10-CM

## 2019-11-09 ENCOUNTER — Encounter (HOSPITAL_BASED_OUTPATIENT_CLINIC_OR_DEPARTMENT_OTHER): Payer: Self-pay | Admitting: Family Medicine

## 2019-11-15 ENCOUNTER — Encounter (HOSPITAL_BASED_OUTPATIENT_CLINIC_OR_DEPARTMENT_OTHER): Payer: Self-pay | Admitting: Family Medicine

## 2019-11-16 NOTE — Telephone Encounter (Signed)
Faxed appeal letter from provider to [employer]    Aileen Pilot, MA

## 2019-11-20 ENCOUNTER — Ambulatory Visit (HOSPITAL_COMMUNITY): Payer: Self-pay | Admitting: Student in an Organized Health Care Education/Training Program

## 2019-11-20 DIAGNOSIS — F331 Major depressive disorder, recurrent, moderate: Secondary | ICD-10-CM

## 2019-11-20 MED ORDER — FLUOXETINE 20 MG TABLET
ORAL_TABLET | ORAL | 1 refills | Status: DC
Start: 2019-11-20 — End: 2019-12-31

## 2019-11-20 NOTE — Telephone Encounter (Signed)
-----   Message from Lesly Rubenstein sent at 11/20/2019 10:16 AM EST -----  Pt is requesting a refill on the following medication. Please call to advise    Current Outpatient Medications:  .  FLUoxetine 20 mg Oral Tablet, TAKE 1 TABLET BY MOUTH TWICE A DAY    Preferred Pharmacy     [id number] [place], [place] - [address]    [address] [place] [address]    Phone: [phone] Fax: [phone]    Not a 24 hour pharmacy; exact hours not known.        Thanks!  Lesly Rubenstein  11/20/2019, 10:15

## 2019-11-22 ENCOUNTER — Other Ambulatory Visit (HOSPITAL_BASED_OUTPATIENT_CLINIC_OR_DEPARTMENT_OTHER): Payer: Self-pay | Admitting: Family Medicine

## 2019-11-22 DIAGNOSIS — J309 Allergic rhinitis, unspecified: Secondary | ICD-10-CM

## 2019-11-23 ENCOUNTER — Other Ambulatory Visit (HOSPITAL_BASED_OUTPATIENT_CLINIC_OR_DEPARTMENT_OTHER): Payer: Self-pay

## 2019-11-27 NOTE — Telephone Encounter (Signed)
Regarding: Strick Pt \\ Rx request  ----- Message from [place] sent at 11/27/2019 12:56 PM EST -----  Dennison Mascot, MD    Pharmacy called in requesting a refill for the following Rx:          .  ipratropium-albuterol 0.5 mg-3 mg(2.5 mg base)/3 mL Solution for Nebulization, 3 mL by Nebulization route Four times a day       Preferred Pharmacy     [id number] [place], [place] - [address]    [address] [place]  [address]    Phone: [phone] Fax: [phone]    Not a 24 hour pharmacy; exact hours not known.        Thanks,  [employer]

## 2019-11-27 NOTE — Telephone Encounter (Signed)
Additional info wanted on letter  Letter placed in provider's mailbox    Aileen Pilot, [place]

## 2019-12-21 ENCOUNTER — Other Ambulatory Visit: Payer: Self-pay

## 2019-12-21 ENCOUNTER — Ambulatory Visit
Admission: RE | Admit: 2019-12-21 | Discharge: 2019-12-21 | Disposition: A | Discharge status: Home / Self Care | Payer: MEDICAID | Source: Ambulatory Visit | Attending: Family Medicine | Admitting: Family Medicine

## 2019-12-21 DIAGNOSIS — R918 Other nonspecific abnormal finding of lung field: Secondary | ICD-10-CM

## 2019-12-23 ENCOUNTER — Other Ambulatory Visit (HOSPITAL_BASED_OUTPATIENT_CLINIC_OR_DEPARTMENT_OTHER): Payer: Self-pay | Admitting: Family Medicine

## 2019-12-23 ENCOUNTER — Other Ambulatory Visit (HOSPITAL_COMMUNITY): Payer: Self-pay | Admitting: Psychiatry

## 2019-12-23 DIAGNOSIS — E559 Vitamin D deficiency, unspecified: Secondary | ICD-10-CM

## 2019-12-31 ENCOUNTER — Telehealth: Payer: MEDICAID | Admitting: Psychiatry

## 2019-12-31 ENCOUNTER — Other Ambulatory Visit: Payer: Self-pay

## 2019-12-31 DIAGNOSIS — F4310 Post-traumatic stress disorder, unspecified: Secondary | ICD-10-CM

## 2019-12-31 DIAGNOSIS — G4700 Insomnia, unspecified: Secondary | ICD-10-CM

## 2019-12-31 DIAGNOSIS — F419 Anxiety disorder, unspecified: Secondary | ICD-10-CM

## 2019-12-31 DIAGNOSIS — F331 Major depressive disorder, recurrent, moderate: Secondary | ICD-10-CM

## 2019-12-31 MED ORDER — FLUOXETINE 20 MG TABLET
ORAL_TABLET | ORAL | 3 refills | Status: DC
Start: 2019-12-31 — End: 2020-02-04

## 2019-12-31 MED ORDER — MIRTAZAPINE 7.5 MG TABLET
7.50 mg | ORAL_TABLET | Freq: Every evening | ORAL | 3 refills | Status: DC
Start: 2019-12-31 — End: 2020-09-30

## 2019-12-31 MED ORDER — BUPROPION HCL XL 150 MG 24 HR TABLET, EXTENDED RELEASE
150.0000 mg | ORAL_TABLET | Freq: Every day | ORAL | 3 refills | Status: DC
Start: 2019-12-31 — End: 2020-03-10

## 2019-12-31 NOTE — Progress Notes (Signed)
[place]  Department of Behavioral Medicine and Psychiatry    Outpatient Progress Note      [name]   [id number]  June 30, 1956    DOS: 12/31/2019        TELEMEDICINE DOCUMENTATION:      Patient Location:  My chart Video visit from home address: [address]  [address] [place] [address]    Patient/family aware of provider location:  yes  Patient/family consent for telemedicine:  yes  Examination observed and performed by:  Jasmine Pang, MD      Chief complaint:   Chief Complaint   Patient presents with    Anxiety    Depression        ID:  [name] is a 64 y.o. 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    Subjective:  Patient presents alone for a follow-up visit for management of  Depression    - She is out of Wellbutrin so mood and energy is low.  - She sleeps about 5 hours each night.    - She is taking Remeron 7.5.  Does not want to change dose at this time.  This was started at last appointment and she is compliant.      Reported side effects: no insomnia, no changes in appetite or weight, no galactorrhea or EPS, no tremor, no rash     Collateral obtained/reviewed:     Medical Hx: no changes since last  visit   Social Hx: no changes since last visit  Previous medication trials:    -     Current Medications:  Current Outpatient Medications   Medication Sig    aspirin (ECOTRIN) 81 mg Oral Tablet, Delayed Release (E.C.) Take 81 mg by mouth Once a day    Azelaic Acid 20  % Cream Apply topically Twice daily (Patient taking differently: Apply topically Twice per day as needed )    buPROPion (WELLBUTRIN XL) 150 mg extended release 24 hr tablet TAKE 1 TABLET BY MOUTH EVERY DAY    cholecalciferol, vitamin D3, (VITAMIN D) 25 mcg (1,000 unit) Oral Tablet Take 1 Tablet (1,000 Units total) by mouth Once a day    Clobetasol (TEMOVATE) 0.05 % Ointment Apply topically Twice daily To rash on hands and feet (Patient taking differently: Apply topically Twice per day as needed To rash on hands and feet)    clobetasoL (TEMOVATE) 0.05 % Ointment Apply topically    erythromycin (ROMYCIN) 5 mg/gram (0.5 %) Ophthalmic Ointment Instill into right eye Twice daily (Patient not taking: Reported on 07/19/2019)    FLUoxetine 20 mg Oral Tablet TAKE 1 TABLET BY MOUTH TWICE A DAY    fluticasone propionate (FLONASE) 50 mcg/actuation Nasal Spray, Suspension 1 Spray by Each Nostril route Once a day    gabapentin (NEURONTIN) 600 mg Oral Tablet Take 1 Tab (600 mg total) by mouth Twice daily    Halobetasol Propionate (ULTRAVATE) 0.05 % Cream Apply topically Twice per day as needed Apply  topically. Three times a week    hydrOXYzine HCL (ATARAX) 50 mg Oral Tablet TAKE 1 TABLET BY  MOUTH EVERY DAY AT NIGHT (Patient taking differently: Take 50 mg by mouth Every evening )    ipratropium-albuterol 0.5 mg-3 mg(2.5 mg base)/3 mL Solution for Nebulization 3 mL by Nebulization route Four times a day    loratadine-pseudoephedrine (CLARITIN-D) 5-120 mg Oral Tablet Sustained Release 12 hr Take 1 Tab by mouth Twice daily    methocarbamoL (ROBAXIN) 500 mg Oral Tablet Take 1 Tab (500 mg total) by mouth Four times a day For muscle spasms    Metronidazole (METROCREAM) 0.75 % Cream Apply topically Twice daily (Patient taking differently: Apply topically Twice per day as needed )    Metronidazole (METROCREAM) 0.75 % Cream Apply topically    mineral oil-hydrophil petrolat (AQUAPHOR) Ointment Apply topically Once per day as needed      Mirtazapine (REMERON) 7.5 mg Oral Tablet TAKE 1 TAB (7.5 MG TOTAL) BY MOUTH EVERY NIGHT    montelukast (SINGULAIR) 10 mg Oral Tablet Take 1 Tab (10 mg total) by mouth Once a day    nitroGLYCERIN (NITROSTAT) 0.4 mg Sublingual Tablet, Sublingual 1 Tab (0.4 mg total) by Sublingual route Every 5 minutes as needed for Chest pain for up to 3 doses for 3 doses over 15 minutes    nystatin (NYSTOP) 100,000 unit/gram Powder Apply once or twice daily to PREVENT rash under breasts    pantoprazole (PROTONIX) 40 mg Oral Tablet, Delayed Release (E.C.) TAKE 1 TAB (40 MG TOTAL) BY MOUTH EVERY MORNING BEFORE BREAKFAST    tolterodine (DETROL) 2 mg Oral Tablet Take 2 mg by mouth Once a day    traZODone (DESYREL) 50 mg Oral Tablet Take 1/2 to full tablet as needed for sleep. (Patient not taking: Reported on 07/19/2019)    triamcinolone acetonide (ARISTOCORT A) 0.1 % Ointment by Apply Topically route Twice daily (Patient taking differently: by Apply Topically route Twice per day as needed )       Review of systems:   Constitutional: no insomnia, no changes in appetite, no weight loss/gain  Cardiac: no chest pain/palpitations  Resp: no shortness of breath  GI: no n/v/d/c  GU: no urinary retention, no incontinence   MSK: no arthralgias/myalgias, no rigidity, no tremulousness, no abnormal movements   Neuro: no headaches, no blurry vision, no balance issues, no falls    Physical Exam:   There were no vitals filed for this visit.    There is no height or weight on file to calculate BMI.    none      Mental Status Exam:  This is an alert  and grossly oriented 64 y.o. female  and she is cooperative and pleasant. Speech is regular rate and volume, clear and articulate. Mood is sad. Thought process is goal-directed with appropriate thought content. Denies SI/HI. No hallucinations, delusions, or paranoia voiced or suspected. Attention is adequate for the conversation and she displays a fair ability to think abstractly. Judgement and insight is  fair.       Assessment:  This is a 64 y.o. female with a history of depression and PTSD presenting for follow-up.  Patient was staying with a sister in [place] but recently was able to move into her own apartment in [place] with HUD assistance.  She receives SSI disability.  She lives by herself.  She takes Prozac 40 mg daily for management of depression.  She has had depression since age 45 with multiple episodes of depression.  She has had SI but no attempts.  One hospitalization.  Current stressors include finances and health problems.  Sleep has worsened with difficult staying asleep that responded to atarax..  She has had a sleep study over 10 years ago but may need another.    Will energy responded to Wellbutrin.  While sleep has responded to Atarax was switched to remeron.      Diagnosis:  ENCOUNTER DIAGNOSES     ICD-10-CM   1. PTSD (post-traumatic stress disorder)  F43.10   2. 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 Anxiety  F41.9   4. 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  Taken as 2 20 tabs daily.  Continue Wellbutrin XL 150 mg daily for low energy  Stop atarax  Continue remeron 7.5 for sleep problem    Medication Orders   Medications    buPROPion (WELLBUTRIN XL) 150 mg extended release 24 hr tablet     Sig: Take 1 Tablet (150 mg total) by mouth Once a day     Dispense:  90 Tablet     Refill:  3    Mirtazapine (REMERON) 7.5 mg Oral Tablet     Sig: Take 1 Tablet (7.5 mg total) by mouth Every night     Dispense:  90 Tablet     Refill:  3    FLUoxetine 20 mg Oral Tablet     Sig: TAKE 1 TABLET BY MOUTH TWICE A DAY     Dispense:  180 Tablet     Refill:  3       Laboratory Studies:  None    Therapy: barb blumish [facility]     Safety: Low risk for imminent harm to self.    Follow up: Return to clinic in 3 months . Patient advised to call in the interim with  questions/concerns and to go to the ER if safety concerns arise.       Late Entry for 12/31/2019.      Jasmine Pang, MD  [place]  Department of Behavioral Medicine and Psychiatry  12/31/2019      Late Entry for 12/31/2019.  I saw and examined the patient virtually via [place], along with the resident.  I met the patient via VIDEO from 14:55 to 15:00 for a total of 5 minutes.  The VIDEO visit was conducted secondary to the pandemic response to COVID-19.  I was present and participated in the development of the treatment plan. I reviewed the resident's note. I agree with the findings and plan of care, as documented in the resident's note.  Any exceptions/ additions are edited/noted.    Traci Plemons N. Jeannetta Ellis, M.D.   Staff Psychiatrist  Stewart Pimenta Meribeth Mattes, MD  01/04/2020, 09:41

## 2020-01-11 ENCOUNTER — Ambulatory Visit (VACCINATION_CLINIC): Payer: MEDICAID

## 2020-02-01 ENCOUNTER — Ambulatory Visit (VACCINATION_CLINIC): Payer: MEDICAID

## 2020-02-04 ENCOUNTER — Ambulatory Visit (HOSPITAL_COMMUNITY): Payer: Self-pay | Admitting: Psychiatry

## 2020-02-04 DIAGNOSIS — F331 Major depressive disorder, recurrent, moderate: Secondary | ICD-10-CM

## 2020-02-04 MED ORDER — FLUOXETINE 20 MG TABLET
ORAL_TABLET | ORAL | 3 refills | Status: DC
Start: 2020-02-04 — End: 2020-09-30

## 2020-02-04 NOTE — Telephone Encounter (Signed)
-----   Message from [name] sent at 01/31/2020  9:46 AM EDT -----  Pt calling back regarding this. Stating the pharmacy told her it was being refused, but stating she will be out today. Please advise. Thank you!     ----- Message from [name] sent at 01/29/2019 10:29 AM EDT -----  Dr Doylene Canning     The patient called back stating she will be out of her medication after today.   Please advise the patient at [phone].   Thank you    ----- Message from [name] sent at 01/25/2019 10:19 AM EDT -----  [name] pt    Pt is calling to get a refill. Thank you.     Current Outpatient Medications:  FLUoxetine (SARAFEM) 20 mg Oral Tablet, Take 1 Tab (20 mg total) by mouth Twice daily    Preferred Pharmacy     [id number] [place] [place] - [address]    [address] [place]  [address]    Phone: [phone] Fax: [phone]    Not a 24 hour pharmacy; exact hours not known.

## 2020-02-07 ENCOUNTER — Ambulatory Visit (HOSPITAL_COMMUNITY): Payer: Self-pay

## 2020-02-07 NOTE — Telephone Encounter (Signed)
Medicaid will not cover a tablet only a capsule. Message sent to Dr Doylene Canning.    Lottie Rater, CASE MANAGER  02/07/2020, 12:47

## 2020-02-07 NOTE — Telephone Encounter (Signed)
Regarding: Prior auth Request Dr. Doylene Canning Pt  ----- Message from Sherryl Barters sent at 02/07/2020 12:13 PM EDT -----  Pt is requesting a prior auth on the following medication. Please call to advise    Current Outpatient Medications:  FLUoxetine 20 mg Oral Tablet, TAKE 1 TABLET BY MOUTH TWICE A DAY    Preferred Pharmacy     [id number] [place] [place] [address]    Phone: [phone] Fax: [phone]    Hours: Not open 24 hours     Thanks!  Dorothea Ogle

## 2020-02-23 ENCOUNTER — Other Ambulatory Visit (HOSPITAL_BASED_OUTPATIENT_CLINIC_OR_DEPARTMENT_OTHER): Payer: Self-pay | Admitting: Family Medicine

## 2020-03-03 ENCOUNTER — Encounter (HOSPITAL_COMMUNITY): Payer: Self-pay | Admitting: Psychiatry

## 2020-03-10 ENCOUNTER — Telehealth: Payer: MEDICAID | Admitting: Psychiatry

## 2020-03-10 DIAGNOSIS — F331 Major depressive disorder, recurrent, moderate: Secondary | ICD-10-CM

## 2020-03-10 DIAGNOSIS — F4310 Post-traumatic stress disorder, unspecified: Secondary | ICD-10-CM

## 2020-03-10 DIAGNOSIS — F419 Anxiety disorder, unspecified: Secondary | ICD-10-CM

## 2020-03-10 MED ORDER — BUPROPION HCL XL 300 MG 24 HR TABLET, EXTENDED RELEASE
300.00 mg | ORAL_TABLET | Freq: Every day | ORAL | 3 refills | Status: DC
Start: 2020-03-10 — End: 2020-09-30

## 2020-03-10 NOTE — Progress Notes (Signed)
[place]  Department of Behavioral Medicine and Psychiatry    Outpatient Progress Note    Phylisha Jha   [id number]  [birth year]    DOS: 03/10/2020        TELEMEDICINE DOCUMENTATION:      Patient Location:  Phone visit from home address: [address]  [address] [place] [address]    Patient/family aware of provider location:  yes  Patient/family consent for telemedicine:  yes  Examination observed and performed by:  Jasmine Pang, MD    20 minutes on phone    Video visit failed.  Patient logged in but provider could not.     Chief complaint:   Chief Complaint   Patient presents with    Depression        ID:  [name] is a 64 y.o. 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    Subjective:  Patient presents alone for a follow-up visit for management of  Depression    Restarted Wellbutrin.  This helps energy.  Mood has been up and down every few hours.  Lows are a low mood.  At least 50% of the last two weeks have been depressed.  She notes lows in mood are marked by deep sadness.  She has tearfulness low energy and no motivation during those depressed periods but notes no suicidal thoughts.  She is still able to find pleasures during the day and enjoys hobbies.   She does not want to adjust dose of Prozac but would be willing to increase Wellbutrin.      Sleep is ok with Remeron.     Reported side effects: no insomnia, no changes in appetite or weight, no galactorrhea or EPS,  no tremor, no rash     Collateral obtained/reviewed:     Medical Hx: no changes since last  visit   Social Hx: no changes since last visit  Previous medication trials:    -     Current Medications:  Current Outpatient Medications   Medication Sig    aspirin (ECOTRIN) 81 mg Oral Tablet, Delayed Release (E.C.) Take 81 mg by mouth Once a day    Azelaic Acid 20 % Cream Apply topically Twice daily (Patient taking differently: Apply topically Twice per day as needed )    buPROPion (WELLBUTRIN XL) 150 mg extended release 24 hr tablet Take 1 Tablet (150 mg total) by mouth Once a day    cholecalciferol, vitamin D3, (VITAMIN D) 25 mcg (1,000 unit) Oral Tablet Take 1 Tablet (1,000 Units total) by mouth Once a day    Clobetasol (TEMOVATE) 0.05 % Ointment Apply topically Twice daily To rash on hands and feet (Patient taking differently: Apply topically Twice per day as needed To rash on hands and feet)    clobetasoL (TEMOVATE) 0.05 % Ointment Apply topically    erythromycin (ROMYCIN) 5 mg/gram (0.5 %) Ophthalmic Ointment Instill into right eye Twice daily (Patient not taking: Reported on 07/19/2019)    FLUoxetine 20 mg Oral Tablet TAKE 1 TABLET BY MOUTH TWICE A DAY    fluticasone propionate (FLONASE) 50  mcg/actuation Nasal Spray, Suspension 1 Spray by Each Nostril route Once a day    gabapentin (NEURONTIN) 600 mg Oral Tablet Take 1 Tab (600 mg total) by mouth Twice daily    Halobetasol Propionate (ULTRAVATE) 0.05 % Cream Apply topically Twice per day as needed Apply  topically. Three times a week    hydrOXYzine HCL (ATARAX) 50 mg Oral Tablet TAKE 1 TABLET BY MOUTH EVERY DAY AT NIGHT (Patient taking differently: Take 50 mg by mouth Every evening )    ipratropium-albuterol 0.5 mg-3 mg(2.5 mg base)/3 mL Solution for Nebulization 3 mL by Nebulization route Four times a day    loratadine-pseudoephedrine (CLARITIN-D) 5-120 mg Oral Tablet Sustained Release 12 hr Take 1 Tab by mouth Twice daily    methocarbamoL (ROBAXIN) 500 mg Oral  Tablet Take 1 Tab (500 mg total) by mouth Four times a day For muscle spasms    Metronidazole (METROCREAM) 0.75 % Cream Apply topically Twice daily (Patient taking differently: Apply topically Twice per day as needed )    Metronidazole (METROCREAM) 0.75 % Cream Apply topically    mineral oil-hydrophil petrolat (AQUAPHOR) Ointment Apply topically Once per day as needed     Mirtazapine (REMERON) 7.5 mg Oral Tablet Take 1 Tablet (7.5 mg total) by mouth Every night    montelukast (SINGULAIR) 10 mg Oral Tablet Take 1 Tab (10 mg total) by mouth Once a day    nitroGLYCERIN (NITROSTAT) 0.4 mg Sublingual Tablet, Sublingual 1 Tab (0.4 mg total) by Sublingual route Every 5 minutes as needed for Chest pain for up to 3 doses for 3 doses over 15 minutes    nystatin (NYSTOP) 100,000 unit/gram Powder Apply once or twice daily to PREVENT rash under breasts    pantoprazole (PROTONIX) 40 mg Oral Tablet, Delayed Release (E.C.) Take 1 Tablet (40 mg total) by mouth Every morning before breakfast    tolterodine (DETROL) 2 mg Oral Tablet Take 2 mg by mouth Once a day    traZODone (DESYREL) 50 mg Oral Tablet Take 1/2 to full tablet as needed for sleep. (Patient not taking: Reported on 07/19/2019)    triamcinolone acetonide (ARISTOCORT A) 0.1 % Ointment by Apply Topically route Twice daily (Patient taking differently: by Apply Topically route Twice per day as needed )       Review of systems:   Constitutional: no insomnia, no changes in appetite, no weight loss/gain  Cardiac: no chest pain/palpitations  Resp: no shortness of breath  GI: no n/v/d/c  GU: no urinary retention, no incontinence   MSK: no arthralgias/myalgias, no rigidity, no tremulousness, no abnormal movements   Neuro: no headaches, no blurry vision, no balance issues, no falls    Physical Exam:   There were no vitals filed for this visit.    There is no height or weight on file to calculate BMI.    none      Mental Status Exam:  This is an alert  and grossly oriented 64 y.o.  female  and she is cooperative and pleasant. Speech is regular rate and volume, clear and articulate. Mood is sad. Thought process is goal-directed with appropriate thought content. Denies SI/HI. No hallucinations, delusions, or paranoia voiced or suspected. Attention is adequate for the conversation and she displays a fair ability to think abstractly. Judgement and insight is fair.       Assessment:  This is a 64 y.o. female with a history of depression and PTSD presenting for follow-up.  Patient was staying with a sister in  [place] but recently was able to move into her own apartment in [place] with HUD assistance.  She receives SSI disability.  She lives by herself.  She takes Prozac 40 mg daily for management of depression.  She has had depression since age 27 with multiple episodes of depression.  She has had SI but no attempts.  One hospitalization.  Current stressors include finances and health problems.  Sleep worsened with difficult staying asleep that responded to remeron..  She has had a sleep study over 10 years ago but may need another.    Mood and energy problems responded to Wellbutrin.       Diagnosis:  ENCOUNTER DIAGNOSES     ICD-10-CM   1. Anxiety  F41.9   2. 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 PTSD (post-traumatic stress disorder)  F43.10       Plan:  Continue fluoxetine 40 mg daily - she will utilize the tablets without blue dye due to an allergy.  Taken as 2 20 mg tabs daily.  Increase Wellbutrin XL 300 mg daily for low energy  Continue remeron 7.5 for sleep problem    Medication Orders   Medications    buPROPion (WELLBUTRIN XL) 300 mg extended release 24 hr tablet     Sig: Take 1 Tablet (300 mg total) by mouth Once a day     Dispense:  90 Tablet     Refill:  3       Laboratory Studies:  None    Therapy: barb blumish [facility]     Safety: Low risk for imminent harm to self.    Follow up: Return to clinic in 3 months . Patient  advised to call in the interim with questions/concerns and to go to the ER if safety concerns arise.         Jasmine Pang, MD  [facility] of Behavioral Medicine and Psychiatry  03/10/2020      Late Entry for 03/10/2020.  I spoke with the patient on the telephone from 15:25 to 15:30 for a total of 5 minutes, along with the resident.  The telephone visit was conducted secondary to the pandemic response to COVID-19.  I was present and participated in the development of the treatment plan. I reviewed the resident's note. I agree with the findings and plan of care, as documented in the resident's note.  Any exceptions/ additions are edited/noted.     Dilip N. Jeannetta Ellis, M.D.   Staff Psychiatrist    Dilip Meribeth Mattes, MD  03/14/2020, 19:24

## 2020-03-25 ENCOUNTER — Encounter (HOSPITAL_BASED_OUTPATIENT_CLINIC_OR_DEPARTMENT_OTHER): Payer: Self-pay | Admitting: Family Medicine

## 2020-04-03 ENCOUNTER — Other Ambulatory Visit (HOSPITAL_BASED_OUTPATIENT_CLINIC_OR_DEPARTMENT_OTHER): Payer: Self-pay | Admitting: Family Medicine

## 2020-04-03 NOTE — Telephone Encounter (Signed)
Regarding: Strick/ med request  ----- Message from Nolen Mu sent at 04/03/2020 10:13 AM EDT -----  Dennison Mascot, MD    Hello    Patient requesting a refill on this medication.    gabapentin (NEURONTIN) 600 mg Oral Tablet 60 Tab 4 03/20/2019    Sig - Route: Take 1 Tab (600 mg total) by mouth Twice daily - Oral   Sent to pharmacy as: gabapentin 600 mg tablet (NEURONTIN)   Class: E-Rx   Non-formulary Exception Code: AIDKS/MM Formulary Info Available   E-Prescribing Status: Receipt confirmed by pharmacy (03/20/2019 3:47 PM EDT)   Pharmacy    Preferred Pharmacy     [id number] - [place], [place] - [address]    [address] [place] [address]    Phone: [phone] Fax: [phone]    Hours: Not open 24 hours      Thanks  Enid Derry

## 2020-04-04 ENCOUNTER — Ambulatory Visit: Payer: MEDICAID | Attending: FAMILY PRACTICE | Admitting: Family Medicine

## 2020-04-04 ENCOUNTER — Other Ambulatory Visit: Payer: Self-pay

## 2020-04-04 ENCOUNTER — Ambulatory Visit (HOSPITAL_BASED_OUTPATIENT_CLINIC_OR_DEPARTMENT_OTHER): Payer: Self-pay | Admitting: Family Medicine

## 2020-04-04 ENCOUNTER — Encounter (HOSPITAL_BASED_OUTPATIENT_CLINIC_OR_DEPARTMENT_OTHER): Payer: Self-pay | Admitting: Family Medicine

## 2020-04-04 VITALS — BP 142/88 | HR 109 | Temp 97.5°F | Resp 16 | Ht 65.0 in | Wt 216.3 lb

## 2020-04-04 DIAGNOSIS — G501 Atypical facial pain: Secondary | ICD-10-CM | POA: Insufficient documentation

## 2020-04-04 DIAGNOSIS — G518 Other disorders of facial nerve: Secondary | ICD-10-CM

## 2020-04-04 DIAGNOSIS — E041 Nontoxic single thyroid nodule: Secondary | ICD-10-CM | POA: Insufficient documentation

## 2020-04-04 MED ORDER — GABAPENTIN 600 MG TABLET
600.00 mg | ORAL_TABLET | Freq: Two times a day (BID) | ORAL | 2 refills | Status: DC
Start: 2020-04-04 — End: 2020-10-03

## 2020-04-04 NOTE — Nursing Note (Signed)
Patient called stating she is out of her gabapentin and her throat feels like she is being choked. Pt also states she has been vomiting since Tuesday 04/01/2020. Pt refuses ED because she is "not sitting for 4-5 hours"  Mayra Reel, RN took over the call. Patient scheduled with Dr. Tiburcio Pea today at 3:30 pm advised if condition worsens to immediately go to the emergency room. Hyacinth Meeker, LPN  9/37/1696, 78:93

## 2020-04-04 NOTE — Progress Notes (Signed)
[facility]  Department of Family Medicine    [name]  Date of Service: 04/04/2020    Chief Complaint:   Chief Complaint   Patient presents with    Throat Symptoms       History of Present Illness  [name] is a 64 y.o. female who presents to clinic for throat issues.  She states she started with diarrhea Tuesday into Wednesday. She is now having nausea. She had hx of thyroid surgery for which she is on gabapentin 600 mg BID for this. She ran out of gabapentin about 1.5 weeks ago. She states that difficulty swallowing is similar to before starting the gabapentin.  Denies fevers, chills, chest pain, SOB, changes in bowel or bladder habits.     Past History  Current Outpatient Medications   Medication Sig    aspirin (ECOTRIN) 81 mg Oral Tablet, Delayed Release (E.C.) Take 81 mg by mouth Once a day    Azelaic Acid 20 % Cream Apply topically Twice daily (Patient taking differently: Apply topically Twice per day as needed )    buPROPion (WELLBUTRIN XL) 300 mg extended release 24 hr tablet Take 1 Tablet (300 mg total) by mouth Once a day    cholecalciferol, vitamin D3, (VITAMIN D) 25 mcg (1,000 unit) Oral Tablet Take 1 Tablet (1,000 Units total) by mouth Once a day    Clobetasol (TEMOVATE) 0.05 % Ointment Apply topically Twice daily To rash on hands and feet (Patient taking differently: Apply topically Twice per day as needed To rash on hands and feet)    clobetasoL (TEMOVATE) 0.05 % Ointment Apply topically    erythromycin (ROMYCIN) 5 mg/gram (0.5 %) Ophthalmic Ointment Instill into right eye Twice daily (Patient not taking: Reported on 07/19/2019)    FLUoxetine 20 mg Oral Tablet TAKE 1 TABLET BY MOUTH TWICE A DAY    fluticasone propionate (FLONASE) 50 mcg/actuation Nasal Spray, Suspension 1 Spray by Each Nostril route Once a day    gabapentin (NEURONTIN) 600 mg Oral Tablet Take 1 Tab (600 mg total) by mouth Twice daily    Halobetasol Propionate (ULTRAVATE) 0.05 % Cream Apply topically Twice per  day as needed Apply  topically. Three times a week    hydrOXYzine HCL (ATARAX) 50 mg Oral Tablet TAKE 1 TABLET BY MOUTH EVERY DAY AT NIGHT (Patient taking differently: Take 50 mg by mouth Every evening )    ipratropium-albuterol 0.5 mg-3 mg(2.5 mg base)/3 mL Solution for Nebulization 3 mL by Nebulization route Four times a day    loratadine-pseudoephedrine (CLARITIN-D) 5-120 mg Oral Tablet Sustained Release 12 hr Take 1 Tab by mouth Twice daily    methocarbamoL (ROBAXIN) 500 mg Oral Tablet Take 1 Tab (500 mg total) by mouth Four times a day For muscle spasms    Metronidazole (METROCREAM) 0.75 % Cream Apply topically Twice daily (Patient taking differently: Apply topically Twice per day as needed )    Metronidazole (METROCREAM) 0.75 % Cream Apply topically    mineral oil-hydrophil petrolat (AQUAPHOR) Ointment Apply topically Once per day as needed     Mirtazapine (REMERON) 7.5 mg Oral Tablet Take 1 Tablet (7.5 mg total) by mouth Every night    montelukast (SINGULAIR) 10 mg Oral Tablet Take 1 Tab (10 mg total) by mouth Once a day    nitroGLYCERIN (NITROSTAT) 0.4 mg Sublingual Tablet, Sublingual 1 Tab (0.4 mg total) by Sublingual route Every 5 minutes as needed for Chest pain for up to 3 doses for 3 doses over  15 minutes    nystatin (NYSTOP) 100,000 unit/gram Powder Apply once or twice daily to PREVENT rash under breasts    pantoprazole (PROTONIX) 40 mg Oral Tablet, Delayed Release (E.C.) Take 1 Tablet (40 mg total) by mouth Every morning before breakfast    tolterodine (DETROL) 2 mg Oral Tablet Take 2 mg by mouth Once a day    traZODone (DESYREL) 50 mg Oral Tablet Take 1/2 to full tablet as needed for sleep. (Patient not taking: Reported on 07/19/2019)    triamcinolone acetonide (ARISTOCORT A) 0.1 % Ointment by Apply Topically route Twice daily (Patient taking differently: by Apply Topically route Twice per day as needed )     Allergies   Allergen Reactions    Cymbalta [Duloxetine]      Rash on feet and hands    Blue  Dye      Told to avoid due to patch test result by [facility]  Other Adverse Reaction (Add comment)     Patient unsure of reaction    Lipitor [Atorvastatin] Myalgia     Muscle pain    Nickel      Blisters  "Allergic to all kinds of metal"     Codeine Itching     RASH    Mobic [Meloxicam] Nausea/ Vomiting     Nervous and shakes     Naprosyn [Naproxen] Nausea/ Vomiting    Nsaids (Non-Steroidal Anti-Inflammatory Drug) Nausea/ Vomiting     Past Medical History:   Diagnosis Date    Anxiety     Arthritis     Cancer ([facility] [place])     internal skin cancer    Chronic pain     lower back    Depression     Dyspnea on exertion     Essential hypertension 02/16/2017    patient denies    GERD (gastroesophageal reflux disease)     controlled w/med    Headache     Heart murmur 1979    benign    Hyperlipidemia LDL goal < 100 08/29/2013    denies    MINOR CAD (coronary artery disease) 11/10/2012    no treatment other than cholesterol medications; follows regularly with Dr. 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    Vaginal prolapse 2012    surgery improved significantly    Wears glasses          Past Surgical History:   Procedure Laterality Date    COLONOSCOPY  01/06/09    COLONOSCOPY performed by Loletta Parish F at [facility]  03/05/2010    GASTROSCOPY performed by Delrae Alfred, SWATI at [facility] [facility]  03/05/2010    GASTROSCOPY WITH BIOPSY performed by Jocelyn Lamer at [place]  2007    left distal fibula, casted    HX APPENDECTOMY      HX CHOLECYSTECTOMY      HX COLONOSCOPY      HX CYSTOCELE REPAIR  09/24/09  HX HAND SURGERY  2012    for Carpal tunnel     HX HEART CATHETERIZATION      HX OOPHORECTOMY      left  ovary removed    HX PAROTIDECTOMY       "surgical clamp"     HX PARTIAL THYROIDECTOMY Right 04/05/2019    R hemithyroidectomy for nodule, final path follicular adenoma    HX TONSILLECTOMY      HX TOTAL VAGINAL HYSTERECTOMY  1979    HX WRIST FRACTURE [place]  2007    [place] injury    PB REVISE ULNAR NERVE AT [place] Left 1979    PB UPPER GI ENDOSCOPY,BIOPSY  12/19/07    patulous GE junction zone, erythema, nonerosive GERD         Family History  Family Medical History:       Problem Relation (Age of Onset)    Bipolar Disorder Daughter    Breast Cancer Paternal Aunt, Paternal Aunt, Paternal 40, Paternal 53, Paternal Aunt, Other    Cancer Paternal Aunt, Paternal 85, Paternal 69, Paternal 79, Paternal 65, Other (31)    Congestive Heart Failure Father (81)    Coronary Artery Disease Father, Mother (40)    Diabetes Mother    Heart Attack Father, Sister    High Cholesterol Mother    Hypertension (High Blood Pressure) Mother, Brother, Sister, Sister    Kidney Disease Sister    Leukemia Paternal Uncle, Paternal Uncle    Stroke Paternal Grandmother    Thyroid Disease Sister              Social History  Social History     Socioeconomic History    Marital status: Divorced     Spouse name: Not on file    Number of children: Not on file    Years of education: Not on file    Highest education level: Not on file   Occupational History    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     Employer: NOT EMPLOYED     Comment: occasionally   Tobacco Use    Smoking status: Former Smoker     Packs/day: 2.00     Years: 40.00     Pack years: 80.00     Types: Cigarettes     Quit date: 01/22/2005     Years since quitting: 15.2    Smokeless tobacco: Never Used   Vaping Use    Vaping Use: Never used   Substance and Sexual Activity    Alcohol use: No     Alcohol/week: 0.0 standard drinks    Drug use: No    Sexual activity: Yes     Partners: Male   Other Topics Concern    Uses Cane No    Uses walker No    Uses wheelchair No    Right hand dominant Yes     Left hand dominant No    Ambidextrous No    Ability to Walk 1 Flight of Steps without SOB/CP Yes    Routine Exercise Yes     Comment: walking 1 time weekly for 2 hours     Ability to Walk 2 Flight of Steps without SOB/CP No     Comment: shortness of breath, no cp     Unable to Ambulate No    Total Care No    Ability To Do Own ADL's Yes    Uses Walker No    Other Activity Level Yes     Comment: housework  Uses Cane No    Shift Work No    Unusual Sleep-Wake Schedule No     Social Determinants of Company secretary Strain:     Difficulty of Paying Living Expenses:    Food Insecurity:     Worried About Charity fundraiser in the Last Year:     Arboriculturist in the Last Year:    Transportation Needs:     Film/video editor (Medical):     Lack of Transportation (Non-Medical):    Physical Activity:     Days of Exercise per Week:     Minutes of Exercise per Session:    Stress:     Feeling of Stress :    Intimate Partner Violence:     Fear of Current or Ex-Partner:     Emotionally Abused:     Physically Abused:     Sexually Abused:      Review of Systems  Other than ROS in the HPI, all other systems were negative.  Examination  Vitals: BP (!) 142/88 (Site: Left, Patient Position: Sitting, Cuff Size: Adult Large)    Pulse (!) 109    Temp 36.4 C (97.5 F) (Thermal Scan)    Resp 16    Ht 1.651 m (5\' 5" )    Wt 98.1 kg (216 lb 4.3 oz)    SpO2 99%    BMI 35.99 kg/m   General: appears in good health  Eyes: Conjunctiva clear.  HENT:Mouth without ulceration.  Neck: No thyromegaly or lymphadenopathy  Cardiovascular: RRR without murmur.  Lungs: clear to auscultation bilaterally.   Abdomen: soft, non-tender and bowel sounds normal  Genito-urinary: Deferred  Extremities: no cyanosis or edema  Skin: No rashes or lesions  Neurologic: Up to exam table without assistance  Lymphatics: no lymphadenopathy  Psychiatric: normal affect, behavior, memory, thought content, judgement, and speech.    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 Thyroid nodule      Orders Placed This Encounter    gabapentin (NEURONTIN) 600 mg Oral Tablet     Facial Nerve Pain  - Restart Gabapentin 600 mg BID    Follow up - PCP in 3 month    Jaynee Eagles, MD    I discussed the patient's care with the Resident prior to the patient leaving the clinic. Any significant discussion points are noted .    Leland Johns, MD

## 2020-04-04 NOTE — Telephone Encounter (Signed)
Patient called into clinic stating that she has been vomiting and having issues with her throat since Tuesday. She reports like she feels like she is being choked. Patient stated she has issues with a nerve in her neck that can affect her swallowing. Patient denied issues with her airway being blocked. Patient refused to go to the ED. patient agreeable to come into clinic. ED precautions again reviewed with patient. Apt scheduled.    Lemar Livings, RN  04/04/2020, 14:35

## 2020-04-15 ENCOUNTER — Encounter (HOSPITAL_BASED_OUTPATIENT_CLINIC_OR_DEPARTMENT_OTHER): Payer: Self-pay | Admitting: Family Medicine

## 2020-04-15 ENCOUNTER — Other Ambulatory Visit: Payer: Self-pay

## 2020-04-15 ENCOUNTER — Ambulatory Visit: Payer: MEDICAID | Attending: Family Medicine | Admitting: Family Medicine

## 2020-04-15 VITALS — BP 122/84 | HR 100 | Temp 97.7°F | Wt 217.6 lb

## 2020-04-15 DIAGNOSIS — N904 Leukoplakia of vulva: Secondary | ICD-10-CM | POA: Insufficient documentation

## 2020-04-15 DIAGNOSIS — J329 Chronic sinusitis, unspecified: Secondary | ICD-10-CM | POA: Insufficient documentation

## 2020-04-15 DIAGNOSIS — H109 Unspecified conjunctivitis: Secondary | ICD-10-CM | POA: Insufficient documentation

## 2020-04-15 MED ORDER — OFLOXACIN 0.3 % EYE DROPS
1.00 [drp] | Freq: Four times a day (QID) | OPHTHALMIC | 0 refills | Status: AC
Start: 2020-04-15 — End: 2020-04-22

## 2020-04-15 MED ORDER — AMOXICILLIN 875 MG-POTASSIUM CLAVULANATE 125 MG TABLET
1.00 | ORAL_TABLET | Freq: Two times a day (BID) | ORAL | 0 refills | Status: AC
Start: 2020-04-15 — End: 2020-04-25

## 2020-04-15 MED ORDER — HALOBETASOL PROPIONATE 0.05 % TOPICAL CREAM
TOPICAL_CREAM | Freq: Two times a day (BID) | CUTANEOUS | 1 refills | Status: DC | PRN
Start: 2020-04-15 — End: 2022-05-27

## 2020-04-15 NOTE — Progress Notes (Signed)
Department of Family Medicine    [name]  DOB: Apr 03, 1956, 64 y.o.   MRN: [id number]  Date of encounter: 04/15/2020    Chief Complaint   Patient presents with    Blurred Vision    Eye Pain     Subjective:    64 y.o. female presenting for follow up on eye pain.    Patient states she has had pain in both eyes for a few weeks. She is waking up with matting and yellow drainage from R eye but now feels it is in her left. She did not take the erythromycin prescribed for her eyes. She hasn't had new glasses for 3 years and notices blurry vision and straining when reading.      Med reconciliation completed in today's visit.  Medical, familial, surgical, and social history updated in EMR where appropriate.   Allergies   Allergen Reactions    Cymbalta [Duloxetine]      Rash on feet and hands    Blue Dye      Told to avoid due to patch test result by [facility]    Flavoring Agent  Other Adverse Reaction (Add comment)     Patient unsure of reaction    Lipitor [Atorvastatin] Myalgia     Muscle pain    Nickel      Blisters  "Allergic to all kinds of metal"     Codeine Itching     RASH    Mobic [Meloxicam] Nausea/ Vomiting     Nervous and shakes     Naprosyn [Naproxen] Nausea/ Vomiting    Nsaids (Non-Steroidal Anti-Inflammatory Drug) Nausea/ Vomiting     Current Outpatient Medications   Medication Sig    amoxicillin-pot clavulanate (AUGMENTIN) 875-125 mg Oral Tablet Take 1 Tablet by mouth Twice daily for 10 days    aspirin (ECOTRIN) 81 mg Oral Tablet, Delayed Release (E.C.) Take 81 mg by mouth Once a day    Azelaic Acid 20 % Cream Apply topically Twice daily (Patient taking differently: Apply topically Twice per day as needed )    buPROPion (WELLBUTRIN XL) 300 mg extended release 24 hr tablet Take 1 Tablet (300 mg total) by mouth Once a day    cholecalciferol, vitamin D3, (VITAMIN D) 25 mcg (1,000 unit) Oral Tablet Take 1 Tablet (1,000 Units total) by mouth Once a day    Clobetasol (TEMOVATE) 0.05 % Ointment Apply topically  Twice daily To rash on hands and feet (Patient taking differently: Apply topically Twice per day as needed To rash on hands and feet)    clobetasoL (TEMOVATE) 0.05 % Ointment Apply topically    erythromycin (ROMYCIN) 5 mg/gram (0.5 %) Ophthalmic Ointment Instill into right eye Twice daily    FLUoxetine 20 mg Oral Tablet TAKE 1 TABLET BY MOUTH TWICE A DAY    fluticasone propionate (FLONASE) 50 mcg/actuation Nasal Spray, Suspension 1 Spray by Each Nostril route Once a day    gabapentin (NEURONTIN) 600 mg Oral Tablet Take 1 Tablet (600 mg total) by mouth Twice daily    Halobetasol Propionate (ULTRAVATE) 0.05 % Cream Apply topically Twice per day as needed Apply  topically. Three times a week    hydrOXYzine HCL (ATARAX) 50 mg Oral Tablet TAKE 1 TABLET BY MOUTH EVERY DAY AT NIGHT (Patient taking differently: Take 50 mg by mouth Every evening )    ipratropium-albuterol 0.5 mg-3 mg(2.5 mg base)/3 mL Solution for Nebulization 3 mL by Nebulization route Four times a day    loratadine-pseudoephedrine (CLARITIN-D)  5-120 mg Oral Tablet Sustained Release 12 hr Take 1 Tab by mouth Twice daily    methocarbamoL (ROBAXIN) 500 mg Oral Tablet Take 1 Tab (500 mg total) by mouth Four times a day For muscle spasms    Metronidazole (METROCREAM) 0.75 % Cream Apply topically Twice daily (Patient taking differently: Apply topically Twice per day as needed )    Metronidazole (METROCREAM) 0.75 % Cream Apply topically    mineral oil-hydrophil petrolat (AQUAPHOR) Ointment Apply topically Once per day as needed     Mirtazapine (REMERON) 7.5 mg Oral Tablet Take 1 Tablet (7.5 mg total) by mouth Every night    montelukast (SINGULAIR) 10 mg Oral Tablet Take 1 Tab (10 mg total) by mouth Once a day    nitroGLYCERIN (NITROSTAT) 0.4 mg Sublingual Tablet, Sublingual 1 Tab (0.4 mg total) by Sublingual route Every 5 minutes as needed for Chest pain for up to 3 doses for 3 doses over 15 minutes    nystatin (NYSTOP) 100,000 unit/gram Powder Apply once or  twice daily to PREVENT rash under breasts    ofloxacin (OCUFLOX) 0.3 % Ophthalmic Drops Instill 1-2 Drops into both eyes Four times a day for 7 days    pantoprazole (PROTONIX) 40 mg Oral Tablet, Delayed Release (E.C.) Take 1 Tablet (40 mg total) by mouth Every morning before breakfast    tolterodine (DETROL) 2 mg Oral Tablet Take 2 mg by mouth Once a day    traZODone (DESYREL) 50 mg Oral Tablet Take 1/2 to full tablet as needed for sleep.    triamcinolone acetonide (ARISTOCORT A) 0.1 % Ointment by Apply Topically route Twice daily (Patient taking differently: by Apply Topically route Twice per day as needed )     Social History     Tobacco Use    Smoking status: Former Smoker     Packs/day: 2.00     Years: 40.00     Pack years: 80.00     Types: Cigarettes     Quit date: 01/22/2005     Years since quitting: 15.2    Smokeless tobacco: Never Used   Vaping Use    Vaping Use: Never used   Substance Use Topics    Alcohol use: No     Alcohol/week: 0.0 standard drinks    Drug use: No       Review of Systems: Positive ROS discussed in HPI, otherwise all other systems negative.    Objective:  Blood pressure 122/84, pulse 100, temperature 36.5 C (97.7 F), temperature source Thermal Scan, weight 98.7 kg (217 lb 9.5 oz), SpO2 96 %, not currently breastfeeding. Body mass index is 36.21 kg/m.    Constitutional: appears stated age, in no acute distress  Eyes: pupils equal and round, reactive to light, no abnormalities on ophthalmoscope exam; mild discharge from both eyes  Oral: no pharyngeal erythema or edema  Neck: supple, symmetric, trachea midline  Respiratory: non-labored breathing, clear to auscultation bilaterally  Cardiovascular: regular rate and rhythm, no murmurs/gallops/rubs  Abdomen: soft, non-distended, non-tender, no guarding  Extremities: no cyanosis or edema  Skin: warm and dry, no rash  Neuro: grossly normal  Psychiatric: normal affect and behavior      Assessment/Plan:  [name] 64 y.o. female        ICD-10-CM    1. Sinusitis, unspecified chronicity, unspecified location  J32.9 amoxicillin-pot clavulanate (AUGMENTIN) 875-125 mg Oral Tablet   2. Lichen sclerosus et atrophicus of the vulva  N90.4 Halobetasol Propionate (ULTRAVATE) 0.05 % Cream  3. Bacterial conjunctivitis of both eyes  H10.9 ofloxacin (OCUFLOX) 0.3 % Ophthalmic Drops     Recommended to have optometry appointment for new eyeglass prescription.      RTC 2 weeks to ensure resolution    Dennison Mascot, MD  PGY-3 Family Medicine          Prior to the patient's discharge today, I saw and examined the patient. I agree with the resident's/fellow's/nurse practitioner's//trainer's assessment and plan. Any exceptions are noted. I have reviewed and discussed the prevention plan with the patient.    Mackie Pai, MD  Associate Professor  Director, Division of Sports Medicine  Dept of [place] [facility]  Operated by [facility]

## 2020-04-22 ENCOUNTER — Telehealth (HOSPITAL_BASED_OUTPATIENT_CLINIC_OR_DEPARTMENT_OTHER): Payer: Self-pay | Admitting: Family Medicine

## 2020-04-22 NOTE — Telephone Encounter (Signed)
PA needed for halobetasol propionate  ID: [id number]  BIN: [id number]  PCN:    Non-preferred agents require five (5) day trials of one (1) form of EACH preferred unique active ingredient in the corresponding potency  group Preferred agents:        Aileen Pilot, MA

## 2020-04-23 ENCOUNTER — Other Ambulatory Visit (HOSPITAL_BASED_OUTPATIENT_CLINIC_OR_DEPARTMENT_OTHER): Payer: Self-pay | Admitting: Family Medicine

## 2020-04-23 DIAGNOSIS — E559 Vitamin D deficiency, unspecified: Secondary | ICD-10-CM

## 2020-04-23 NOTE — Telephone Encounter (Signed)
90-day supply sent while covering for previous PCP, needs to re-establish for further refills.

## 2020-04-29 ENCOUNTER — Encounter (HOSPITAL_BASED_OUTPATIENT_CLINIC_OR_DEPARTMENT_OTHER): Payer: Self-pay

## 2020-04-29 ENCOUNTER — Other Ambulatory Visit: Payer: Self-pay

## 2020-04-29 ENCOUNTER — Encounter (HOSPITAL_BASED_OUTPATIENT_CLINIC_OR_DEPARTMENT_OTHER): Payer: Self-pay | Admitting: Family Medicine

## 2020-04-29 ENCOUNTER — Ambulatory Visit
Admission: RE | Admit: 2020-04-29 | Discharge: 2020-04-29 | Disposition: A | Discharge status: Home / Self Care | Payer: MEDICAID | Source: Ambulatory Visit | Attending: Family Medicine | Admitting: Family Medicine

## 2020-04-29 DIAGNOSIS — Z1239 Encounter for other screening for malignant neoplasm of breast: Secondary | ICD-10-CM | POA: Insufficient documentation

## 2020-04-29 DIAGNOSIS — Z1231 Encounter for screening mammogram for malignant neoplasm of breast: Secondary | ICD-10-CM

## 2020-04-30 ENCOUNTER — Ambulatory Visit (HOSPITAL_BASED_OUTPATIENT_CLINIC_OR_DEPARTMENT_OTHER): Payer: MEDICAID

## 2020-04-30 ENCOUNTER — Ambulatory Visit
Admission: RE | Admit: 2020-04-30 | Discharge: 2020-04-30 | Disposition: A | Discharge status: Home / Self Care | Payer: MEDICAID | Source: Ambulatory Visit | Attending: Physician Assistant | Admitting: Physician Assistant

## 2020-04-30 ENCOUNTER — Ambulatory Visit (HOSPITAL_BASED_OUTPATIENT_CLINIC_OR_DEPARTMENT_OTHER): Payer: MEDICAID | Admitting: Physician Assistant

## 2020-04-30 ENCOUNTER — Encounter (HOSPITAL_BASED_OUTPATIENT_CLINIC_OR_DEPARTMENT_OTHER): Payer: Self-pay | Admitting: Physician Assistant

## 2020-04-30 ENCOUNTER — Other Ambulatory Visit (HOSPITAL_BASED_OUTPATIENT_CLINIC_OR_DEPARTMENT_OTHER): Payer: Self-pay | Admitting: Family Medicine

## 2020-04-30 VITALS — BP 102/70 | HR 100 | Temp 97.9°F | Wt 216.5 lb

## 2020-04-30 DIAGNOSIS — E890 Postprocedural hypothyroidism: Secondary | ICD-10-CM

## 2020-04-30 DIAGNOSIS — Z0000 Encounter for general adult medical examination without abnormal findings: Secondary | ICD-10-CM

## 2020-04-30 DIAGNOSIS — J3089 Other allergic rhinitis: Secondary | ICD-10-CM

## 2020-04-30 DIAGNOSIS — R928 Other abnormal and inconclusive findings on diagnostic imaging of breast: Secondary | ICD-10-CM | POA: Insufficient documentation

## 2020-04-30 DIAGNOSIS — R109 Unspecified abdominal pain: Secondary | ICD-10-CM | POA: Insufficient documentation

## 2020-04-30 DIAGNOSIS — Z9889 Other specified postprocedural states: Secondary | ICD-10-CM

## 2020-04-30 DIAGNOSIS — N904 Leukoplakia of vulva: Secondary | ICD-10-CM

## 2020-04-30 DIAGNOSIS — Z7689 Persons encountering health services in other specified circumstances: Secondary | ICD-10-CM

## 2020-04-30 DIAGNOSIS — K5790 Diverticulosis of intestine, part unspecified, without perforation or abscess without bleeding: Secondary | ICD-10-CM | POA: Insufficient documentation

## 2020-04-30 DIAGNOSIS — H538 Other visual disturbances: Secondary | ICD-10-CM

## 2020-04-30 DIAGNOSIS — H5713 Ocular pain, bilateral: Secondary | ICD-10-CM | POA: Insufficient documentation

## 2020-04-30 DIAGNOSIS — K5900 Constipation, unspecified: Secondary | ICD-10-CM

## 2020-04-30 DIAGNOSIS — R140 Abdominal distension (gaseous): Secondary | ICD-10-CM

## 2020-04-30 MED ORDER — CETIRIZINE 10 MG TABLET
10.00 mg | ORAL_TABLET | Freq: Every day | ORAL | 5 refills | Status: DC
Start: 2020-04-30 — End: 2023-02-01

## 2020-04-30 NOTE — Progress Notes (Signed)
Department of Family Medicine   History and Physical      [name]  MRN: [id number]  DOB: 05/17/1956  Date of Service: 04/30/2020    CHIEF COMPLAINT  Chief Complaint   Patient presents with   . Follow-up     eyes still hurting       HISTORY OF PRESENT ILLNESS  [name] is a 64 y.o. female presenting to clinic to establish care.    Patient was most recently seen and treated for a sinus infection. She states that her eyes are still bothering her. She says that they become swollen, droopy, and puffy, sometimes obstucting her vision. She endorses a strong history of allergies, but only takes Flonase for management of them. She does report difficulty pain in her eyes as well that she describes as pressure and blurred vision making it difficult to focus on things. This seems to be worse at night. Does wear glasses regularly, but has not seen an eye doctor for approx 3 years. Denies floaters.     She also recently received the results of her mammogram and states that she needs an ultrasound for further investigation. She denies symptoms of breast pain, nipple discharge, skin changes, and weight loss.     Additionally, she has been having lower belly pain when she eats and uses the bathroom for a few months. She describes it as sharp and stabbing at times. Notices improvement after BMs. States that her stools were solid previously, but are now loose. Denies black, bloody, tarry, or pale colored stool. Most recent colonoscopy was September 2020. Does not recall results. Admits to poor fiber intake. States she drinks a lot of water. Denies fevers, chills, nausea, or vomiting.     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 She was initially treated with halbetasol that worked very well for her and only required a small amount for treatment. She has been trying to get refills, but was told she needed a prior authorization. Upon chart review, prior Josem Kaufmann was attempted,  but denied. She states that she experiences bleeding and pain when she does not have the halbetasol. Has tried clabetasol previously, but states that she requires a larger volume of the medication.        PAST MEDICAL HISTORY  Past Medical History:   Diagnosis Date   . Anxiety    . Arthritis    . Cancer (CMS HCC)     internal skin cancer   . Chronic pain     lower back   . Depression    . Dyspnea on exertion    . Essential hypertension 02/16/2017    patient denies   . GERD (gastroesophageal reflux disease)     controlled w/med   . Headache    . Heart murmur 1979    benign   . Hyperlipidemia LDL goal < 100 08/29/2013    denies   . MINOR CAD (coronary artery disease) 11/10/2012    no treatment other than cholesterol medications; follows regularly with Dr. Rodney Booze   . Multinodular goiter    . Muscle weakness     neck    . Obesity    . Peripheral edema    . Rash     face only eczema   . Rheumatic fever 16    no complications   . Squamous cell carcinoma 02/06/2015    right side of neck   . 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 Vaginal prolapse 2012    surgery improved significantly   . Wears glasses            MEDICATIONS  aspirin (ECOTRIN) 81 mg Oral Tablet, Delayed Release (E.C.), Take 81 mg by mouth Once a day  Azelaic Acid 20 % Cream, Apply topically Twice daily (Patient taking differently: Apply topically Twice per day as needed )  buPROPion (WELLBUTRIN XL) 300 mg extended release 24 hr tablet, Take 1 Tablet (300 mg total) by mouth Once a day  cholecalciferol, vitamin D3, (VITAMIN D) 25 mcg (1,000 unit) Oral Tablet, Take 1 Tablet (1,000 Units total) by mouth Once a day  Clobetasol (TEMOVATE) 0.05 % Ointment, Apply topically Twice daily To rash on hands and feet (Patient taking differently: Apply topically Twice per day as needed To rash on hands and feet)  clobetasoL (TEMOVATE) 0.05 % Ointment, Apply topically  erythromycin (ROMYCIN) 5 mg/gram (0.5 %) Ophthalmic Ointment, Instill into right eye Twice  daily  FLUoxetine 20 mg Oral Tablet, TAKE 1 TABLET BY MOUTH TWICE A DAY  fluticasone propionate (FLONASE) 50 mcg/actuation Nasal Spray, Suspension, 1 Spray by Each Nostril route Once a day  gabapentin (NEURONTIN) 600 mg Oral Tablet, Take 1 Tablet (600 mg total) by mouth Twice daily  Halobetasol Propionate (ULTRAVATE) 0.05 % Cream, Apply topically Twice per day as needed Apply  topically. Three times a week  hydrOXYzine HCL (ATARAX) 50 mg Oral Tablet, TAKE 1 TABLET BY MOUTH EVERY DAY AT NIGHT (Patient taking differently: Take 50 mg by mouth Every evening )  ipratropium-albuterol 0.5 mg-3 mg(2.5 mg base)/3 mL Solution for Nebulization, 3 mL by Nebulization route Four times a day  loratadine-pseudoephedrine (CLARITIN-D) 5-120 mg Oral Tablet Sustained Release 12 hr, Take 1 Tab by mouth Twice daily  methocarbamoL (ROBAXIN) 500 mg Oral Tablet, Take 1 Tab (500 mg total) by mouth Four times a day For muscle spasms  Metronidazole (METROCREAM) 0.75 % Cream, Apply topically Twice daily (Patient taking differently: Apply topically Twice per day as needed )  Metronidazole (METROCREAM) 0.75 % Cream, Apply topically  mineral oil-hydrophil petrolat (AQUAPHOR) Ointment, Apply topically Once per day as needed   Mirtazapine (REMERON) 7.5 mg Oral Tablet, Take 1 Tablet (7.5 mg total) by mouth Every night  montelukast (SINGULAIR) 10 mg Oral Tablet, Take 1 Tab (10 mg total) by mouth Once a day  nitroGLYCERIN (NITROSTAT) 0.4 mg Sublingual Tablet, Sublingual, 1 Tab (0.4 mg total) by Sublingual route Every 5 minutes as needed for Chest pain for up to 3 doses for 3 doses over 15 minutes  nystatin (NYSTOP) 100,000 unit/gram Powder, Apply once or twice daily to PREVENT rash under breasts  pantoprazole (PROTONIX) 40 mg Oral Tablet, Delayed Release (E.C.), Take 1 Tablet (40 mg total) by mouth Every morning before breakfast  tolterodine (DETROL) 2 mg Oral Tablet, Take 2 mg by mouth Once a day  traZODone (DESYREL) 50 mg Oral Tablet, Take 1/2 to  full tablet as needed for sleep.  triamcinolone acetonide (ARISTOCORT A) 0.1 % Ointment, by Apply Topically route Twice daily (Patient taking differently: by Apply Topically route Twice per day as needed )    No facility-administered medications prior to visit.      ALLERGIES  Allergies   Allergen Reactions   . Cymbalta [Duloxetine]      Rash on feet and hands   . Blue Dye      Told to avoid due to patch test result by Simone Curia   . Flavoring Agent  Other Adverse Reaction (Add comment)     Patient unsure of reaction   . Lipitor [Atorvastatin] Myalgia     Muscle pain   . Nickel      Blisters  "Allergic to all kinds of metal"    . Codeine Itching     RASH   . Mobic [Meloxicam] Nausea/ Vomiting     Nervous and shakes    . Naprosyn [Naproxen] Nausea/ Vomiting   . Nsaids (Non-Steroidal Anti-Inflammatory Drug) Nausea/ Vomiting       PAST SURGICAL HISTORY  Past Surgical History:   Procedure Laterality Date   . COLONOSCOPY  01/06/09    COLONOSCOPY performed by Loletta Parish F at [facility]   . GASTROSCOPY  03/05/2010    GASTROSCOPY performed by Jocelyn Lamer at [facility]   . GASTROSCOPY WITH BIOPSY  03/05/2010    GASTROSCOPY WITH BIOPSY performed by Jocelyn Lamer at [facility]   . HX ADENOIDECTOMY     . HX ANKLE FRACTURE [place]  2007    left distal fibula, casted   . HX APPENDECTOMY     . HX CHOLECYSTECTOMY     . HX COLONOSCOPY     . HX CYSTOCELE REPAIR  09/24/09   . HX HAND SURGERY  2012    for Carpal tunnel    . HX HEART CATHETERIZATION     . HX OOPHORECTOMY      left ovary removed   . HX PAROTIDECTOMY       "surgical clamp"    . HX PARTIAL THYROIDECTOMY Right 04/05/2019    R hemithyroidectomy for nodule, final path follicular adenoma   . HX TONSILLECTOMY     . HX TOTAL VAGINAL HYSTERECTOMY  1979   . HX WRIST FRACTURE [place]  2007    [place] injury   . PB REVISE ULNAR NERVE AT ELBOW Left 1979   . PB UPPER GI ENDOSCOPY,BIOPSY  12/19/07    patulous GE junction zone, erythema, nonerosive GERD           IMMUNIZATIONS  Immunization History    Administered Date(s) Administered   . DIPTH,PERTUSSIS-ACEL,TETANUS (ADACEL) >11 YRS OLD    (ADMIN) 06/04/2010   . H1n1 Flu Vaccine Injection (Admin) 10/10/2008   . INFLUENZA VIRUS VACCINE (ADMIN) 09/11/2001, 07/24/2012, 10/11/2013   . Influenza Vaccine, 6 month-adult 09/09/2010, 09/07/2011, 07/24/2012, 10/11/2013, 08/19/2014, 08/28/2015, 06/19/2016, 07/14/2017, 07/20/2018, 07/12/2019   . Pneumovax 03/29/2013   . Shingrix - Zoster Vaccine (Admin) 04/04/2019, 07/05/2019   . Tetanus,Diptheria,Pertussis(BOOSTRIX)(ADMIN) 06/04/2010, 03/12/2020       FAMILY HISTORY  Family Medical History:     Problem Relation (Age of Onset)    Bipolar Disorder Daughter, Paternal Uncle    Breast Cancer Paternal Aunt, Paternal Aunt, Paternal Aunt, Paternal Aunt, Paternal Aunt, Other    Cancer Paternal Aunt, Paternal 81, Paternal 30, Paternal 14, Paternal 78, Other (49)    Congestive Heart Failure Father (45)    Coronary Artery Disease Father, Mother (27)    Diabetes Mother    Heart Attack Father, Sister    High Cholesterol Mother    Hypertension (High Blood Pressure) Mother, Brother, Sister, Sister    Kidney Disease Sister    Leukemia Paternal Uncle    Stroke Paternal Grandmother    Thyroid Disease Sister              SOCIAL HISTORY  Social History     Socioeconomic History   . Marital status: Divorced     Spouse name: Not  on file   . Number of children: Not on file   . Years of education: Not on file   . Highest education level: Not on file   Occupational History   . 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 Smoking status: Former Smoker     Packs/day: 2.00     Years: 40.00     Pack years: 80.00     Types: Cigarettes     Quit date: 01/22/2005     Years since quitting: 15.2   . Smokeless tobacco: Never Used   Vaping Use   . Vaping Use: Never used   Substance and Sexual Activity   . Alcohol use: No     Alcohol/week: 0.0 standard drinks   . Drug use: No   . Sexual  activity: Yes     Partners: Male   Other Topics Concern   . Uses Cane No   . Uses walker No   . Uses wheelchair No   . Right hand dominant Yes   . Left hand dominant No   . Ambidextrous No   . Ability to Walk 1 Flight of Steps without SOB/CP Yes   . Routine Exercise Yes     Comment: walking 1 time weekly for 2 hours    . Ability to Walk 2 Flight of Steps without SOB/CP No     Comment: shortness of breath, no cp    . Unable to Ambulate No   . Total Care No   . Ability To Do Own ADL's Yes   . Uses Walker No   . Other Activity Level Yes     Comment: housework    . Uses Cane No   . Shift Work No   . Unusual Sleep-Wake Schedule No     Social Determinants of Health     Financial Resource Strain:    . Difficulty of Paying Living Expenses:    Food Insecurity:    . Worried About Charity fundraiser in the Last Year:    . Arboriculturist in the Last Year:    Transportation Needs:    . Film/video editor (Medical):    Marland Kitchen Lack of Transportation (Non-Medical):    Physical Activity:    . Days of Exercise per Week:    . Minutes of Exercise per Session:    Stress:    . Feeling of Stress :    Intimate Partner Violence:    . Fear of Current or Ex-Partner:    . Emotionally Abused:    Marland Kitchen Physically Abused:    . Sexually Abused:        REVIEW OF SYSTEMS  Positive ROS discussed in HPI, otherwise all other systems negative.      PHYSICAL EXAM  Vitals: Blood pressure 102/70, pulse 100, temperature 36.6 C (97.9 F), temperature source Thermal Scan, weight 98.2 kg (216 lb 7.9 oz), SpO2 97 %, not currently breastfeeding. Body mass index is 36.03 kg/m.  General: appears stated age, no acute distress  HEENT: conjunctiva clear; pupils equal and round; normal appearing TMs bilaterally; mouth mucus membranes moist; pharynx appears normal without exudate or erythema  Neck: no thyromegaly or lymphadenopathy  Cardiovascular: RRR, no murmur, no carotid bruits  Lungs: clear to auscultation bilaterally  Abdomen: soft, bowel sounds normal, diffuse  tenderness to light and deep palpation of lower abdomen, no rebound tenderness or guarding  Genito-urinary: Deferred  Extremities: no cyanosis or edema  Skin: no rashes or lesions  Neurologic: gait normal, CN 2-12 grossly intact, AOx3  Psychiatric: normal affect and behavior    ASSESSMENT AND PLAN  (Z76.89) Encounter to establish care  (primary encounter diagnosis)  Plan:   Health maintenance reviewed   Medications reviewed and reconciled  LIPID PANEL, COMPREHENSIVE METABOLIC PANEL,         NON-FASTING    (H57.13) Eye pain, bilateral  (H53.8) Blurred vision, bilateral  Plan:   Worsening  DDx includes incorrect corrective prescription for glasses vs open-angle glaucoma vs xeropthalmia   Refer to [facility] for further evaluation     (R92.8) Abnormal mammogram  Plan:   Acute finding  US BREAST RIGHT-ADDL VIEWS/BREAST US AS REQ BY RAD    (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  Plan:   THYROID STIMULATING HORMONE WITH FREE T4 REFLEX for monitoring     (R10.9) Abdominal pain, unspecified abdominal location  (K57.90) Diverticulosis  Plan:   Acute complaint   Concer for diverticulitis given colonoscopy findings of pandiverticulosis found upon chart review   XR KUB AND UPRIGHT ABDOMEN  Encouraged increased fiber intake   ED precautions given   Will notify of results    (L75.3) Lichen sclerosus et atrophicus of the vulva  Plan:   Chronic   Will look into prior auth of halobetasol  Will consider alternative option if denies    (J30.89) Environmental and seasonal allergies  Plan:   Chronic   Suspect complaints of "puffyness of eyes" are exacerbated by uncontrolled allergy symptoms  Trial cetirizine (ZYRTEC) 10 mg Oral Tablet           Health Maintanence    BMI Screening  Appropriate range based off of the patient's age is 18.5-24.9  BMI addressed: Advised on diet, weight loss, and exercise to reduce above normal BMI.    Wt Readings from Last 3 Encounters:   04/30/20 98.2 kg (216 lb 7.9 oz)   04/15/20 98.7 kg (217 lb 9.5 oz)    04/04/20 98.1 kg (216 lb 4.3 oz)      Discussed healthy diet, regular exercise, appropriate weight loss, and referral to dietician.     Type II DM Screening    Lab Results   Component Value Date    GLUCOSEFAST 104 08/29/2013      Lab Results   Component Value Date    HA1C 5.4 02/19/2016       Blood Pressure Screening  Goal is < 150/90   Elevated Blood Pressure Plan of Care:  AT goal        Dyslipidemia Screening   Lab Results   Component Value Date    CHOLESTEROL 214 (H) 03/05/2019    LDLCHOL 136 (H) 03/05/2019    TRIG 193 (H) 03/05/2019   Ordered.    Colorectal Cancer Screening   Last colonoscopy was performed September 2020.        Depression Screening   PHQ Questionnaire  Little interest or pleasure in doing things.: More than half the days  Feeling down, depressed, or hopeless: Several Days  PHQ 2 Total: 3  Trouble falling or staying asleep, or sleeping too much.: More than half the days  Feeling tired or having little energy: More than half the days  Poor appetite or overeating: Several Days  Feeling bad about yourself/ that you are a failure in the past 2 weeks?: Several Days  Trouble concentrating on things in the past 2 weeks?: Not at  all  Moving/Speaking slowly or being fidgety or restless  in the past 2 weeks?: Not at all  Thoughts that you would be better off DEAD, or of hurting yourself in some way.: Not at all  If you checked off any problems, how difficult have these problems made it for you to do your work, take care of things at home, or get along with other people?: Somewhat difficult  PHQ 9 Total: 9  Interpretation of Total Score: 5-9 Mild depression    Depression screening is positive. Follow up plan of care: Existing diagnosis. Follows with behavioral med.        Breast Cancer Screening   Recent Results (from the past 17520 hour(s))   MAMMO BILATERAL SCREENING-ADDL VIEWS/BREAST US AS REQ BY RAD    Collection Time: 04/29/20 10:47 AM    Narrative    Computer aided detection (CAD) technology has  been applied to the standard   (CC and MLO) images.    3D tomosynthesis and 2D images were acquired and reviewed    FILMS COMPARED:  Compared to: 04/25/2019 MAMMO BILATERAL SCREENING-ADDL VIEWS/BREAST US AS   REQ BY RAD, 02/23/2018 MAMMO BILATERAL SCREENING-ADDL VIEWS/BREAST US AS   REQ BY RAD, and 11/03/2015 MAMMO BILATERAL SCREENING    HISTORY:  Procedure: MAMMO BILATERAL SCREENING W TOMO-ADDL VIEWS/BREAST US AS REQ BY   RAD  Reason for exam: Screening for breast cancer      FINDINGS:  The breasts have scattered areas of fibroglandular density.    Right  Mass: There is an 8 mm oval mass seen in the right breast at 8 o'clock in   the posterior depth, 10 cm from the nipple.     Left  There is no evidence of suspicious masses, calcifications, or other   abnormal findings.      Impression    Ultrasound is recommended for the right breast.  Follow up mammogram in 1 year is recommended for the left breast.    BI-RADS ATLAS category (left): 1 Negative  BI-RADS ATLAS category (right): 0 Incomplete: Needs Additional Imaging   Evaluation       Cervical Cancer Screening  Last pap smear: hx of partial hysterectomy          Orders Placed This Encounter   . XR KUB AND UPRIGHT ABDOMEN   . US BREAST RIGHT-ADDL VIEWS/BREAST US AS REQ BY RAD   . THYROID STIMULATING HORMONE WITH FREE T4 REFLEX   . LIPID PANEL   . COMPREHENSIVE METABOLIC PANEL, NON-FASTING   . Refer to [facility] [employer]   . cetirizine (ZYRTEC) 10 mg Oral Tablet       Return in about 6 months (around 10/31/2020), or if symptoms worsen or fail to improve.    Bodee Lafoe Hasis, PA-C 04/30/2020, 18:40

## 2020-05-05 ENCOUNTER — Other Ambulatory Visit: Payer: Self-pay

## 2020-05-05 ENCOUNTER — Ambulatory Visit: Payer: MEDICAID | Attending: Physician Assistant

## 2020-05-05 DIAGNOSIS — Z7689 Persons encountering health services in other specified circumstances: Secondary | ICD-10-CM | POA: Insufficient documentation

## 2020-05-05 DIAGNOSIS — Z9889 Other specified postprocedural states: Secondary | ICD-10-CM | POA: Insufficient documentation

## 2020-05-05 DIAGNOSIS — E890 Postprocedural hypothyroidism: Secondary | ICD-10-CM

## 2020-05-05 LAB — COMPREHENSIVE METABOLIC PANEL, NON-FASTING
ALBUMIN: 3.7 g/dL (ref 3.4–4.8)
ALKALINE PHOSPHATASE: 63 U/L (ref 50–130)
ALT (SGPT): 17 U/L (ref 8–22)
ANION GAP: 7 mmol/L (ref 4–13)
AST (SGOT): 18 U/L (ref 8–45)
BILIRUBIN TOTAL: 0.3 mg/dL (ref 0.3–1.3)
BUN/CREA RATIO: 9 (ref 6–22)
BUN: 9 mg/dL (ref 8–25)
CALCIUM: 9.2 mg/dL (ref 8.8–10.2)
CHLORIDE: 108 mmol/L (ref 96–111)
CO2 TOTAL: 23 mmol/L (ref 23–31)
CREATININE: 0.95 mg/dL (ref 0.60–1.05)
ESTIMATED GFR: 64 mL/min/BSA (ref >=60)
GLUCOSE: 73 mg/dL (ref 65–125)
POTASSIUM: 4.2 mmol/L (ref 3.5–5.1)
PROTEIN TOTAL: 7.2 g/dL (ref 6.0–8.0)
SODIUM: 138 mmol/L (ref 136–145)

## 2020-05-05 LAB — LIPID PANEL
CHOL/HDL RATIO: 5.9
CHOLESTEROL: 214 mg/dL — ABNORMAL HIGH (ref 100–200)
HDL CHOL: 36 mg/dL — ABNORMAL LOW (ref >=50)
LDL CALC: 130 mg/dL — ABNORMAL HIGH (ref <100)
NON-HDL: 178 mg/dL (ref <=190)
TRIGLYCERIDES: 241 mg/dL — ABNORMAL HIGH (ref <150)
VLDL CALC: 48 mg/dL — ABNORMAL HIGH (ref <30)

## 2020-05-05 LAB — THYROID STIMULATING HORMONE WITH FREE T4 REFLEX: TSH: 1.829 u[IU]/mL (ref 0.430–3.550)

## 2020-05-08 ENCOUNTER — Other Ambulatory Visit (HOSPITAL_BASED_OUTPATIENT_CLINIC_OR_DEPARTMENT_OTHER): Payer: Self-pay | Admitting: Physician Assistant

## 2020-05-08 ENCOUNTER — Encounter (HOSPITAL_BASED_OUTPATIENT_CLINIC_OR_DEPARTMENT_OTHER): Payer: Self-pay | Admitting: Physician Assistant

## 2020-05-08 DIAGNOSIS — N904 Leukoplakia of vulva: Secondary | ICD-10-CM

## 2020-05-08 MED ORDER — CLOBETASOL 0.05 % TOPICAL OINTMENT
TOPICAL_OINTMENT | Freq: Two times a day (BID) | CUTANEOUS | 3 refills | Status: DC
Start: 2020-05-08 — End: 2020-09-29

## 2020-05-08 NOTE — Progress Notes (Signed)
Requesting refill of clabetasol. Approved and sent to pharmacy.     Jaiyla Granados Hasis, PA-C

## 2020-05-13 ENCOUNTER — Encounter

## 2020-06-03 ENCOUNTER — Ambulatory Visit
Admission: RE | Admit: 2020-06-03 | Discharge: 2020-06-03 | Disposition: A | Discharge status: Home / Self Care | Payer: MEDICAID | Source: Ambulatory Visit | Attending: Physician Assistant | Admitting: Physician Assistant

## 2020-06-03 ENCOUNTER — Other Ambulatory Visit (HOSPITAL_COMMUNITY): Payer: Self-pay

## 2020-06-03 ENCOUNTER — Other Ambulatory Visit: Payer: Self-pay

## 2020-06-03 DIAGNOSIS — R928 Other abnormal and inconclusive findings on diagnostic imaging of breast: Secondary | ICD-10-CM | POA: Insufficient documentation

## 2020-06-03 DIAGNOSIS — N6001 Solitary cyst of right breast: Secondary | ICD-10-CM

## 2020-06-21 ENCOUNTER — Other Ambulatory Visit (HOSPITAL_BASED_OUTPATIENT_CLINIC_OR_DEPARTMENT_OTHER): Payer: Self-pay | Admitting: Student in an Organized Health Care Education/Training Program

## 2020-06-21 ENCOUNTER — Other Ambulatory Visit (HOSPITAL_COMMUNITY): Payer: Self-pay

## 2020-06-21 DIAGNOSIS — E559 Vitamin D deficiency, unspecified: Secondary | ICD-10-CM

## 2020-06-21 LAB — EXTERNAL COVID-19 MOLECULAR RESULT: External 2019-n-CoV/SARS-CoV-2: POSITIVE — AB

## 2020-06-24 ENCOUNTER — Ambulatory Visit (HOSPITAL_BASED_OUTPATIENT_CLINIC_OR_DEPARTMENT_OTHER): Payer: Self-pay | Admitting: Physician Assistant

## 2020-06-24 ENCOUNTER — Other Ambulatory Visit (INDEPENDENT_AMBULATORY_CARE_PROVIDER_SITE_OTHER): Payer: Self-pay

## 2020-06-24 NOTE — Telephone Encounter (Signed)
Regarding: Hasis/ health concern  ----- Message from Jules Husbands sent at 06/23/2020  9:07 AM EDT -----  Steffanie Dunn Hasis, PA-C    Pt is Covid pt and [place] states.  Requesting a call back to discuss concerns.  Please advise    Thanks  Jules Husbands

## 2020-06-24 NOTE — Telephone Encounter (Signed)
Called and spoke with patient -  Visiting her Sister in [place] was tested for COVID she is COVID +. Tested on 08/25  She reports that she is feeling better that she did when she tested. She is taking Mucinex for congestion and Advil for fevers. She denies any Chest pain or Shortness of breath.. Reviewed symptomatic treated and quarantine information. ED precautions were reviewed with patient.   Wayland Salinas, RN

## 2020-06-25 ENCOUNTER — Ambulatory Visit (INDEPENDENT_AMBULATORY_CARE_PROVIDER_SITE_OTHER): Payer: Self-pay | Admitting: Optometrist

## 2020-07-12 ENCOUNTER — Other Ambulatory Visit: Payer: Self-pay

## 2020-07-12 ENCOUNTER — Encounter (HOSPITAL_COMMUNITY): Payer: Self-pay

## 2020-07-12 ENCOUNTER — Emergency Department (EMERGENCY_DEPARTMENT_HOSPITAL): Payer: MEDICAID

## 2020-07-12 ENCOUNTER — Emergency Department (HOSPITAL_COMMUNITY): Payer: MEDICAID

## 2020-07-12 ENCOUNTER — Emergency Department
Admission: EM | Admit: 2020-07-12 | Discharge: 2020-07-12 | Disposition: A | Discharge status: Home / Self Care | Payer: MEDICAID | Attending: Emergency Medicine | Admitting: Emergency Medicine

## 2020-07-12 DIAGNOSIS — W19XXXA Unspecified fall, initial encounter: Secondary | ICD-10-CM

## 2020-07-12 DIAGNOSIS — M79605 Pain in left leg: Secondary | ICD-10-CM

## 2020-07-12 DIAGNOSIS — M25521 Pain in right elbow: Secondary | ICD-10-CM

## 2020-07-12 DIAGNOSIS — Z87891 Personal history of nicotine dependence: Secondary | ICD-10-CM | POA: Insufficient documentation

## 2020-07-12 DIAGNOSIS — M1711 Unilateral primary osteoarthritis, right knee: Secondary | ICD-10-CM

## 2020-07-12 DIAGNOSIS — S3993XA Unspecified injury of pelvis, initial encounter: Secondary | ICD-10-CM

## 2020-07-12 DIAGNOSIS — M25571 Pain in right ankle and joints of right foot: Secondary | ICD-10-CM

## 2020-07-12 DIAGNOSIS — T148XXA Other injury of unspecified body region, initial encounter: Secondary | ICD-10-CM | POA: Insufficient documentation

## 2020-07-12 DIAGNOSIS — R202 Paresthesia of skin: Secondary | ICD-10-CM | POA: Insufficient documentation

## 2020-07-12 DIAGNOSIS — M7989 Other specified soft tissue disorders: Secondary | ICD-10-CM

## 2020-07-12 DIAGNOSIS — M25562 Pain in left knee: Secondary | ICD-10-CM | POA: Insufficient documentation

## 2020-07-12 DIAGNOSIS — W010XXA Fall on same level from slipping, tripping and stumbling without subsequent striking against object, initial encounter: Secondary | ICD-10-CM | POA: Insufficient documentation

## 2020-07-12 DIAGNOSIS — M25862 Other specified joint disorders, left knee: Secondary | ICD-10-CM

## 2020-07-12 DIAGNOSIS — M25572 Pain in left ankle and joints of left foot: Secondary | ICD-10-CM | POA: Insufficient documentation

## 2020-07-12 MED ORDER — FENTANYL (PF) 50 MCG/ML INJECTION SOLUTION
100.0000 ug | INTRAMUSCULAR | Status: AC
Start: 2020-07-12 — End: 2020-07-12
  Administered 2020-07-12: 100 ug via INTRAVENOUS
  Filled 2020-07-12: qty 2

## 2020-07-12 MED ORDER — METHOCARBAMOL 500 MG TABLET
500.0000 mg | ORAL_TABLET | Freq: Four times a day (QID) | ORAL | 0 refills | Status: DC | PRN
Start: 2020-07-12 — End: 2020-07-17

## 2020-07-12 MED ORDER — KETOROLAC 30 MG/ML (1 ML) INJECTION SOLUTION
30.0000 mg | INTRAMUSCULAR | Status: DC
Start: 2020-07-12 — End: 2020-07-12

## 2020-07-12 MED ORDER — FENTANYL (PF) 50 MCG/ML INJECTION SOLUTION
50.0000 ug | INTRAMUSCULAR | Status: AC
Start: 2020-07-12 — End: 2020-07-12
  Administered 2020-07-12: 50 ug via INTRAVENOUS
  Filled 2020-07-12: qty 2

## 2020-07-12 NOTE — ED Nurses Note (Signed)
Patient returned from Xray without complications.

## 2020-07-12 NOTE — ED Nurses Note (Signed)
Fitted for crutches with instructions with return demonstration .

## 2020-07-12 NOTE — ED Provider Notes (Signed)
[facility]  Emergency Department  Provider Note    Name: [name]  Age and Gender: 64 y.o. female  Date of Birth: [birth year]  Date of Service: 07/12/2020   MRN: [id number]  PCP: Christella Hartigan, PA-C    Chief Complaint   Patient presents with   . Fall     states she tripped and fell 2 days ago landing on left side. states pain to left ankle, left toe. denies hitting or loc.        HPI:  Arrival: The patient arrived by private car and is alone  History Limitations: none    [name] is a 64 y.o. female presenting with left ankle & knee pain s/p fall. Patient states she was walking in a parking lot and stumbled off a curb, landing sideways on her left knee & right elbow as well as twisting her left ankle. She denies chest pain, SOB, palpitations, dizziness, vision changes or LOC prior to fall. She denies head injury, memory loss or LOC after fall. Patient reports immediate pain in her left ankle & knee which she has tried to manage with icing & Alieve without effect. She states she has a sharp, stabbing pain in her left great toe that radiates all the way up to her hip, caused by movement. She denies numbness or tingling, focal weakness or pallor, obvious deformity. She states she has not been able to bear weight on her LLE and has been primarily using her RLE to ambulate, now experiencing new right ankle pain. She also reports her right elbow hurts, no obvious deformity, no loss of motion but states she "had a big knot" on the back of her arm. Patient reports going to [facility] yesterday for pain & paresthesias but was in the waiting room for too long so went home. Of note, she reports previous left ankle fracture treated non-surgically.       ROS:  Constitutional: No fever, chills or weakness   Skin: No rash or diaphoresis  HENT: No headaches, or congestion  Eyes: No vision changes or photophobia   Cardio: No chest pain, palpitations or leg swelling   Respiratory: No cough, wheezing or SOB  GI:   No nausea, vomiting or stool changes  GU:  No dysuria, hematuria, or increased frequency  MSK: Positive muscle & joint aches. No back pain  Neuro: Positive paresthesias LLE. No seizures, LOC, numbness, tingling, or focal weakness  Psychiatric: No depression, SI or substance abuse  All other systems reviewed and are negative.      Below pertinent information reviewed with patient and/or EMR:  Past Medical History:   Diagnosis Date   . Anxiety    . Arthritis    . Cancer (CMS HCC)     internal skin cancer   . Chronic pain     lower back   . Depression    . Dyspnea on exertion    . Essential hypertension 02/16/2017    patient denies   . GERD (gastroesophageal reflux disease)     controlled w/med   . Headache    . Heart murmur 1979    benign   . Hyperlipidemia LDL goal < 100 08/29/2013    denies   . MINOR CAD (coronary artery disease) 11/10/2012    no treatment other than cholesterol medications; follows regularly with Dr. Rodney Booze   . Multinodular goiter    . Muscle weakness     neck    . Obesity    .  Peripheral edema    . Rash     face only eczema   . Rheumatic fever 16    no complications   . Squamous cell carcinoma 02/06/2015    right side of neck   . 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 Vaginal prolapse 2012    surgery improved significantly   . Wears glasses      Medications Prior to Admission     Prescriptions    aspirin (ECOTRIN) 81 mg Oral Tablet, Delayed Release (E.C.)    Take 81 mg by mouth Once a day    Azelaic Acid 20 % Cream    Apply topically Twice daily    Patient taking differently:  Apply topically Twice per day as needed     buPROPion (WELLBUTRIN XL) 300 mg extended release 24 hr tablet    Take 1 Tablet (300 mg total) by mouth Once a day    cetirizine (ZYRTEC) 10 mg Oral Tablet    Take 1 Tablet (10 mg total) by mouth Once a day    cholecalciferol, vitamin D3, (VITAMIN D) 25 mcg (1,000 unit) Oral Tablet    Take 1 Tablet (1,000 Units total) by mouth Once a day for 90 days    clobetasoL  (TEMOVATE) 0.05 % Ointment    by Apply Topically route Twice daily for 90 days    erythromycin (ROMYCIN) 5 mg/gram (0.5 %) Ophthalmic Ointment    Instill into right eye Twice daily    FLUoxetine 20 mg Oral Tablet    TAKE 1 TABLET BY MOUTH TWICE A DAY    fluticasone propionate (FLONASE) 50 mcg/actuation Nasal Spray, Suspension    1 Spray by Each Nostril route Once a day    gabapentin (NEURONTIN) 600 mg Oral Tablet    Take 1 Tablet (600 mg total) by mouth Twice daily    Halobetasol Propionate (ULTRAVATE) 0.05 % Cream    Apply topically Twice per day as needed Apply  topically. Three times a week    hydrOXYzine HCL (ATARAX) 50 mg Oral Tablet    TAKE 1 TABLET BY MOUTH EVERY DAY AT NIGHT    Patient taking differently:  Take 50 mg by mouth Every evening     ipratropium-albuterol 0.5 mg-3 mg(2.5 mg base)/3 mL Solution for Nebulization    3 mL by Nebulization route Four times a day    methocarbamoL (ROBAXIN) 500 mg Oral Tablet    Take 1 Tab (500 mg total) by mouth Four times a day For muscle spasms    Metronidazole (METROCREAM) 0.75 % Cream    Apply topically Twice daily    Patient taking differently:  Apply topically Twice per day as needed     Metronidazole (METROCREAM) 0.75 % Cream    Apply topically    mineral oil-hydrophil petrolat (AQUAPHOR) Ointment    Apply topically Once per day as needed     Mirtazapine (REMERON) 7.5 mg Oral Tablet    Take 1 Tablet (7.5 mg total) by mouth Every night    montelukast (SINGULAIR) 10 mg Oral Tablet    Take 1 Tab (10 mg total) by mouth Once a day    nitroGLYCERIN (NITROSTAT) 0.4 mg Sublingual Tablet, Sublingual    1 Tab (0.4 mg total) by Sublingual route Every 5 minutes as needed for Chest pain for up to 3 doses for 3 doses over 15 minutes    nystatin (NYSTOP) 100,000 unit/gram Powder    Apply once or twice daily to PREVENT rash under  breasts    pantoprazole (PROTONIX) 40 mg Oral Tablet, Delayed Release (E.C.)    Take 1 Tablet (40 mg total) by mouth Every morning before breakfast     tolterodine (DETROL) 2 mg Oral Tablet    Take 2 mg by mouth Once a day    traZODone (DESYREL) 50 mg Oral Tablet    Take 1/2 to full tablet as needed for sleep.    triamcinolone acetonide (ARISTOCORT A) 0.1 % Ointment    by Apply Topically route Twice daily    Patient taking differently:  by Apply Topically route Twice per day as needed         Allergies   Allergen Reactions   . Cymbalta [Duloxetine]      Rash on feet and hands   . Blue Dye      Told to avoid due to patch test result by Simone Curia   . Flavoring Agent  Other Adverse Reaction (Add comment)     Patient unsure of reaction   . Lipitor [Atorvastatin] Myalgia     Muscle pain   . Nickel      Blisters  "Allergic to all kinds of metal"    . Codeine Itching     RASH   . Mobic [Meloxicam] Nausea/ Vomiting     Nervous and shakes    . Naprosyn [Naproxen] Nausea/ Vomiting   . Nsaids (Non-Steroidal Anti-Inflammatory Drug) Nausea/ Vomiting     Past Surgical History:   Procedure Laterality Date   . COLONOSCOPY  01/06/09    COLONOSCOPY performed by Loletta Parish F at [place]   . GASTROSCOPY  03/05/2010    GASTROSCOPY performed by Jocelyn Lamer at [place]   . GASTROSCOPY WITH BIOPSY  03/05/2010    GASTROSCOPY WITH BIOPSY performed by Jocelyn Lamer at [place]   . HX ADENOIDECTOMY     . HX ANKLE FRACTURE [place]  2007    left distal fibula, casted   . HX APPENDECTOMY     . HX CHOLECYSTECTOMY     . HX COLONOSCOPY     . HX CYSTOCELE REPAIR  09/24/09   . HX HAND SURGERY  2012    for Carpal tunnel    . HX HEART CATHETERIZATION     . HX OOPHORECTOMY      left ovary removed   . HX PAROTIDECTOMY       "surgical clamp"    . HX PARTIAL THYROIDECTOMY Right 04/05/2019    R hemithyroidectomy for nodule, final path follicular adenoma   . HX TONSILLECTOMY     . HX TOTAL VAGINAL HYSTERECTOMY  1979   . HX WRIST FRACTURE [place]  2007    Page injury   . PB REVISE ULNAR NERVE AT ELBOW Left 1979   . PB UPPER GI ENDOSCOPY,BIOPSY  12/19/07    patulous GE junction zone, erythema, nonerosive GERD      Family Medical History:     Problem Relation (Age of Onset)    Bipolar Disorder Daughter, Paternal Uncle    Breast Cancer Paternal Aunt, Paternal Aunt, Paternal Aunt, Paternal Aunt, Paternal Aunt, Other    Cancer Paternal Aunt, Paternal 65, Paternal 8, Paternal 67, Paternal 10, Other (51)    Congestive Heart Failure Father (49)    Coronary Artery Disease Father, Mother (37)    Diabetes Mother    Heart Attack Father, Sister    High Cholesterol Mother    Hypertension (High Blood Pressure) Mother, Brother, Sister, Sister  Kidney Disease Sister    Leukemia Paternal Uncle    Stroke Paternal Grandmother    Thyroid Disease Sister        Social History     Tobacco Use   . Smoking status: Former Smoker     Packs/day: 2.00     Years: 40.00     Pack years: 80.00     Types: Cigarettes     Quit date: 01/22/2005     Years since quitting: 15.4   . Smokeless tobacco: Never Used   Vaping Use   . Vaping Use: Never used   Substance Use Topics   . Alcohol use: No     Alcohol/week: 0.0 standard drinks   . Drug use: No       Objective:  ED Triage Vitals [07/12/20 0846]   BP (Non-Invasive) (!) 137/93   Heart Rate 95   Respiratory Rate 18   Temperature 36.3 C (97.3 F)   SpO2 98 %   Weight 97.4 kg (214 lb 11.7 oz)   Height 1.676 m (5\' 6" )     Nursing notes and vital signs reviewed.    Constitutional:  64 y.o. female who appears stated age in fair to good health and in mild distress. Normal color, no cyanosis.   HENT:   Head: Normocephalic and atraumatic.   Mouth/Throat: Oropharynx is clear and moist.   Eyes: EOMI, PERRL   Neck: Trachea midline. Neck supple.  Cardiovascular: RRR, No murmurs, rubs or gallops. Intact distal pulses.  Pulmonary/Chest: BS equal bilaterally. No respiratory distress. No wheezes, rales or chest tenderness.   Abdominal: BS +. Abdomen soft, no tenderness, rebound or guarding.  Back: No midline spinal tenderness, no paraspinal tenderness, no CVA tenderness.   Musculoskeletal: No obviousl deformities.  Bruising around left knee & anterior tibia with abrasion, mild swelling. BL LE neurovascualrly intact. No focal step offs. Gross tenderness to left knee, left ankle, anterior left tibia, more so around popliteal fossa and inferior to the medial malleolus. Full active & passive ROM LLE. No joint instability on provocation.   Right ankle without deformity or swelling, focal tenderness along the anterior ankle joint. Full active & passive ROM. No joint instability.  Right elbow without deformity or edema, tender to palpation posteriorly along olecranon. Full active & passive ROM. RUE neurovascularly intact.  Skin: warm and dry. No rash, erythema, pallor or cyanosis  Psychiatric: normal mood and affect. Behavior is normal.   Neurological: Patient keenly alert and responsive, CN II-XII grossly intact, moving all extremities equally and fully, normal gait    Labs:   Labs Reviewed - No data to display    Imaging:  XR TIBIA-FIBULA LEFT   Final Result   No acute osseous abnormality is identified. Joint spaces and   alignment are preserved.          XR FOOT LEFT   Final Result   No acute osseous abnormality is identified. Joint spaces and   alignment are preserved. The soft tissues are unremarkable.       XR HUMERUS RIGHT   Final Result   No acute osseous abnormality is identified. Joint spaces and   alignment are preserved. The soft tissues are unremarkable.          XR ELBOW RIGHT   Final Result   No acute osseous abnormality is identified. Joint spaces and   alignment are preserved.  No joint effusion is visible.      XR FEMUR LEFT   Final Result  No acute osseous abnormality is identified. Degenerative joint   space narrowing is appreciated about the left knee. Alignment is    preserved.          XR ANKLE RIGHT   Final Result   No acute osseous abnormality is identified. Joint spaces and   alignment are preserved.          XR PELVIS   Final Result   No evidence of acute traumatic injury to the osseous pelvis.         XR  KNEE LEFT TRAUMA SERIES   Final Result   No acute osseous abnormality is identified. Mild   tricompartmental joint space narrowing is appreciated however alignment   remains preserved.       XR ANKLE LEFT   Final Result   No acute osseous abnormality is identified. Joint spaces and   alignment are preserved. Mild swelling is noted overlying the medial   malleolus.           MDM/Course:  [name] is a 64 y.o. female who presented with left knee & ankle pain s/p fall.     Patient seen by and discussed with attending physician, Dr. Saunders Revel.   Given patients history, current symptoms, and exam; concern for, but not limited to, contusion, sprain, fracture, dislocation   Xray left foot, ankle, tib-fib, knee, femur, pelvis negative for acute fracture or dislocaiton   Xray right anklenegative for acute fracture or dislocaiton   Xray right elbow, humerus negative for acute fracture or dislocaiton    S/p Fenanyl   Discussed imaging findings with patient including negative workup for fracture or disclocation. Given nonfocal tenderness and pain with active ROM, discussed diagnosis of strain & contusion. Patient agreeable to assessment & plan, including discharge to home with Tylenol PRN for mild-moderate pain & Robaxin for severe pain. Crutches to aid with ambulation. Patient to follow up with PCP, to return for increased pain, swelling or worsening of symptoms.     ED Course as of Jul 12 1248   Sat Jul 12, 2020   1231 XR ELBOW RIGHT [NP]   1231 XR ANKLE RIGHT [NP]   5945 XR KNEE LEFT TRAUMA SERIES [NP]   1231 XR ANKLE LEFT [NP]      ED Course User Index  [NP] Lendon Collar, MD       Clinical Impression:     Encounter Diagnoses   Name Primary?   . Contusion Yes   . Fall        Medications given:  Medications   fentaNYL (SUBLIMAZE) 50 mcg/mL injection (50 mcg Intravenous Given 07/12/20 0934)   fentaNYL (SUBLIMAZE) 50 mcg/mL injection (100 mcg Intravenous Given 07/12/20 1225)       Following the below history,  physical exam, and studies, the patient was deemed stable and suitable for discharge. The patient was advised to return to the ED for any new or worsening symptoms. Discharge medications, and follow-up instructions were discussed with the patient in detail, who verbalizes understanding. The patient is in agreement and comfortable with the plan of care.     Disposition: Discharged       Current Discharge Medication List      CONTINUE these medications which have CHANGED during your visit.      Details   methocarbamoL 500 mg Tablet  Commonly known as: ROBAXIN  What changed:    when to take this   reasons to take this   500 mg, Oral, EVERY 6  HOURS PRN, For muscle spasms  Qty: 15 Tablet  Refills: 0        CONTINUE these medications - NO CHANGES were made during your visit.      Details   Aquaphor Ointment  Generic drug: mineral oil-hydrophil petrolat   Topical, DAILY PRN  Refills: 0     aspirin 81 mg Tablet, Delayed Release (E.C.)  Commonly known as: ECOTRIN   81 mg, Oral, DAILY  Refills: 0     Azelaic Acid 20 % Cream   Topical, 2 TIMES DAILY  Qty: 50 g  Refills: 0     buPROPion 300 mg Tablet Sustained Release 24 hr  Commonly known as: WELLBUTRIN XL   300 mg, Oral, DAILY  Qty: 90 Tablet  Refills: 3     cetirizine 10 mg Tablet  Commonly known as: ZYRTEC   10 mg, Oral, DAILY  Qty: 30 Tablet  Refills: 5     cholecalciferol (vitamin D3) 25 mcg (1,000 unit) Tablet  Commonly known as: Vitamin D   1,000 Units, Oral, DAILY  Qty: 90 Tablet  Refills: 3     clobetasoL 0.05 % Ointment  Commonly known as: TEMOVATE   Apply Topically, 2 TIMES DAILY  Qty: 60 g  Refills: 3     erythromycin 5 mg/gram (0.5 %) Ointment  Commonly known as: ROMYCIN   Right Eye, 2 TIMES DAILY  Qty: 1 g  Refills: 0     FLUoxetine 20 mg Tablet   TAKE 1 TABLET BY MOUTH TWICE A DAY  Qty: 180 Tablet  Refills: 3     fluticasone propionate 50 mcg/actuation Spray, Suspension  Commonly known as: FLONASE   1 Spray, Each Nostril, DAILY  Qty: 48 mL  Refills: 3      gabapentin 600 mg Tablet  Commonly known as: NEURONTIN   600 mg, Oral, 2 TIMES DAILY  Qty: 60 Tablet  Refills: 2     Halobetasol Propionate 0.05 % Cream  Commonly known as: ULTRAVATE   Topical, 2 TIMES DAILY PRN, Apply  topically. Three times a week  Qty: 50 g  Refills: 1     hydrOXYzine HCL 50 mg Tablet  Commonly known as: ATARAX   TAKE 1 TABLET BY MOUTH EVERY DAY AT NIGHT  Qty: 30 Tab  Refills: 1     ipratropium-albuteroL 0.5 mg-3 mg(2.5 mg base)/3 mL Solution for Nebulization  Commonly known as: DUONEB   3 mL, Nebulization, 4 TIMES DAILY  Qty: 360 mL  Refills: 5     * Metronidazole 0.75 % Cream  Commonly known as: METROCREAM   Topical  Refills: 0     * Metronidazole 0.75 % Cream  Commonly known as: METROCREAM   Topical, 2 TIMES DAILY  Qty: 45 g  Refills: 1     Mirtazapine 7.5 mg Tablet  Commonly known as: REMERON   7.5 mg, Oral, NIGHTLY  Qty: 90 Tablet  Refills: 3     montelukast 10 mg Tablet  Commonly known as: SINGULAIR   10 mg, Oral, DAILY  Qty: 90 Tab  Refills: 3     nitroGLYCERIN 0.4 mg Tablet, Sublingual  Commonly known as: NITROSTAT   0.4 mg, Sublingual, EVERY 5 MIN PRN, for 3 doses over 15 minutes  Qty: 20 Tab  Refills: 0     nystatin 100,000 unit/gram Powder  Commonly known as: NYSTOP   Apply once or twice daily to PREVENT rash under breasts  Qty: 60 g  Refills: 11  pantoprazole 40 mg Tablet, Delayed Release (E.C.)  Commonly known as: PROTONIX   40 mg, Oral, EVERY MORNING BEFORE BREAKFAST  Qty: 30 Tablet  Refills: 5     tolterodine 2 mg Tablet  Commonly known as: DETROL   2 mg, Oral, DAILY  Refills: 0     traZODone 50 mg Tablet  Commonly known as: DESYREL   Take 1/2 to full tablet as needed for sleep.  Qty: 30 Tab  Refills: 3     triamcinolone acetonide 0.1 % Ointment  Commonly known as: ARISTOCORT A   Apply Topically, 2 TIMES DAILY  Qty: 80 g  Refills: 2         * This list has 2 medication(s) that are the same as other medications prescribed for you. Read the directions carefully, and ask your  doctor or other care provider to review them with you.                Follow up:    Florene Glen  [address]  [place] [id number]  [phone]    In 1 week        Parts of this patients chart were completed in a retrospective fashion due to simultaneous direct patient care activities in the Emergency Department.   This note was partially generated using MModal Fluency Direct system, and there may be some incorrect words, spellings, and punctuation that were not noted in checking the note before saving.      Lendon Collar, DO   PGY-1 [facility] [place] [employer] [facility]

## 2020-07-12 NOTE — ED Attending Note (Signed)
Department of Emergency Medicine    Date: 07/12/2020   Time: 9:11 AM    Medical Decision Making:   During the patient's stay in the emergency department, images and/or labs were performed to assist with medical decision making and were reviewed by myself.  Patient seen and examined with the assistance of Dr. Chancy Hurter.      Mechanical fall 2 days prior to presentation with no head injury or LOC.  Complaining predominantly of left ankle and leg pain in addition to right upper extremity pain.  X-rays obtained which did not demonstrate acute fracture, the patient has been able to ambulate since the event and do not suspect an occult fracture at this time, will provide crutches for support, recommend conservative treatment, return for repeat x-rays in 1 week if symptoms unimproved, earlier for signs or symptoms of vascular insult or infection.  Amenable stable for discharge    Impression:   Encounter Diagnoses   Name Primary?    Contusion Yes    Fall          Critical care time: none  I was physically present and directly supervised this patients care. Patient seen and examined with the resident, Dr. Chancy Hurter, and history and exam reviewed. Key elements in addition to and/or correction of that documentation are as follows:  Patient is a 64 y.o.  female presenting to the ED with mechanical fall, two days PTA.  Landed on forearm and L knee/ankle.  No head injury.  No LOC.  Difficulty bearing weight since incident.      ROS: Otherwise negative, if commented on in the HPI.   Filed Vitals:    07/12/20 1000 07/12/20 1040 07/12/20 1225 07/12/20 1230   BP: 126/74   130/79   Pulse: 76 (!) 31  61   Resp: 18 18  18    Temp:       SpO2: 95% 93% 97% 94%       PMH, PSH, medications, allergies, SH, and FH per resident/MLP note. Important aspects of these fields pertaining to today's visit taken into consideration during history/physical and MDM.    Physical Exam:   Gen: NAD  CV: NRR  Pulm: No focal rales  Ext: Palpable pulses, limited ROM  ankle 2/2 pain, sensation intact    Please see primary note for physical exam findings. Additional findings of my own are documented above.     Chart completed after conclusion of patient care due to time constraints of direct patient care during shift.

## 2020-07-12 NOTE — ED Nurses Note (Signed)
Patient secured a ride home

## 2020-07-17 ENCOUNTER — Other Ambulatory Visit: Payer: Self-pay

## 2020-07-17 ENCOUNTER — Encounter (HOSPITAL_BASED_OUTPATIENT_CLINIC_OR_DEPARTMENT_OTHER): Payer: Self-pay | Admitting: Physician Assistant

## 2020-07-17 ENCOUNTER — Ambulatory Visit
Admission: RE | Admit: 2020-07-17 | Discharge: 2020-07-17 | Disposition: A | Discharge status: Home / Self Care | Payer: MEDICAID | Source: Ambulatory Visit | Attending: Physician Assistant | Admitting: Physician Assistant

## 2020-07-17 ENCOUNTER — Ambulatory Visit (HOSPITAL_BASED_OUTPATIENT_CLINIC_OR_DEPARTMENT_OTHER): Payer: MEDICAID | Admitting: Physician Assistant

## 2020-07-17 VITALS — BP 132/78 | HR 103 | Temp 97.5°F | Resp 20 | Ht 66.0 in | Wt 211.9 lb

## 2020-07-17 DIAGNOSIS — Z9181 History of falling: Secondary | ICD-10-CM | POA: Insufficient documentation

## 2020-07-17 DIAGNOSIS — M545 Low back pain, unspecified: Secondary | ICD-10-CM

## 2020-07-17 DIAGNOSIS — S8992XA Unspecified injury of left lower leg, initial encounter: Secondary | ICD-10-CM

## 2020-07-17 DIAGNOSIS — G5792 Unspecified mononeuropathy of left lower limb: Secondary | ICD-10-CM | POA: Insufficient documentation

## 2020-07-17 MED ORDER — METHOCARBAMOL 500 MG TABLET
500.0000 mg | ORAL_TABLET | Freq: Four times a day (QID) | ORAL | 0 refills | Status: AC | PRN
Start: 2020-07-17 — End: 2020-07-31

## 2020-07-17 MED ORDER — LIDOCAINE 5 % TOPICAL PATCH
1.0000 | MEDICATED_PATCH | Freq: Every day | CUTANEOUS | 0 refills | Status: AC
Start: 2020-07-17 — End: 2020-07-27

## 2020-07-17 NOTE — Progress Notes (Signed)
Department of Family Medicine   Progress Note    [name]  MRN: [id number]  DOB: December 21, 1955  Date of Service: 07/17/2020    CHIEF COMPLAINT  Chief Complaint   Patient presents with    Emergency Room Follow Up       SUBJECTIVE  [name] is a 64 y.o. female who presents to clinic for ED follow up status post fall causing injury to her left leg.     Per chart review, she was walking in a parking lot and stumbled off a curb landing on her left knee and right elbow. She also twisted her left ankle. No head injury or LOC. She was evaluated in the ED and ruled out for acute fracture or traumatic process. She was discharged with robaxin that she has not picked up. She has been resting and using a crutch for stablity when walking. Her injuries from her fall are improving, however, she did suffer an acute injury to her back when bending over to pick up a piece of trash. She states that the pain hit her lower middle back while bent over. Describes it as sharp, stabbing that radiates across her back. Denes numbness/ tingling/ weakness of BLE. She does note since her back injury, feeling like her left foot/ heel is "swollen" or like its "going to pop" even though it is not swollen. Denies loss of sensation. She has been icing her back with little pain relief.            Review of Systems:  Positive ROS discussed in HPI, otherwise all other systems negative.      Medications:   aspirin (ECOTRIN) 81 mg Oral Tablet, Delayed Release (E.C.), Take 81 mg by mouth Once a day  Azelaic Acid 20 % Cream, Apply topically Twice daily (Patient taking differently: Apply topically Twice per day as needed )  buPROPion (WELLBUTRIN XL) 300 mg extended release 24 hr tablet, Take 1 Tablet (300 mg total) by mouth Once a day  cetirizine (ZYRTEC) 10 mg Oral Tablet, Take 1 Tablet (10 mg total) by mouth Once a day  cholecalciferol, vitamin D3, (VITAMIN D) 25 mcg (1,000 unit) Oral Tablet, Take 1 Tablet (1,000 Units total) by mouth  Once a day for 90 days  clobetasoL (TEMOVATE) 0.05 % Ointment, by Apply Topically route Twice daily for 90 days  erythromycin (ROMYCIN) 5 mg/gram (0.5 %) Ophthalmic Ointment, Instill into right eye Twice daily  FLUoxetine 20 mg Oral Tablet, TAKE 1 TABLET BY MOUTH TWICE A DAY  fluticasone propionate (FLONASE) 50 mcg/actuation Nasal Spray, Suspension, 1 Spray by Each Nostril route Once a day  gabapentin (NEURONTIN) 600 mg Oral Tablet, Take 1 Tablet (600 mg total) by mouth Twice daily  Halobetasol Propionate (ULTRAVATE) 0.05 % Cream, Apply topically Twice per day as needed Apply  topically. Three times a week  hydrOXYzine HCL (ATARAX) 50 mg Oral Tablet, TAKE 1 TABLET BY MOUTH EVERY DAY AT NIGHT (Patient taking differently: Take 50 mg by mouth Every evening )  ipratropium-albuterol 0.5 mg-3 mg(2.5 mg base)/3 mL Solution for Nebulization, 3 mL by Nebulization route Four times a day  methocarbamoL (ROBAXIN) 500 mg Oral Tablet, Take 1 Tablet (500 mg total) by mouth Every 6 hours as needed (severe pain) For muscle spasms  Metronidazole (METROCREAM) 0.75 % Cream, Apply topically Twice daily (Patient taking differently: Apply topically Twice per day as needed )  Metronidazole (METROCREAM) 0.75 % Cream, Apply topically  mineral oil-hydrophil petrolat (AQUAPHOR) Ointment, Apply topically Once  per day as needed   Mirtazapine (REMERON) 7.5 mg Oral Tablet, Take 1 Tablet (7.5 mg total) by mouth Every night  montelukast (SINGULAIR) 10 mg Oral Tablet, Take 1 Tab (10 mg total) by mouth Once a day  nitroGLYCERIN (NITROSTAT) 0.4 mg Sublingual Tablet, Sublingual, 1 Tab (0.4 mg total) by Sublingual route Every 5 minutes as needed for Chest pain for up to 3 doses for 3 doses over 15 minutes  nystatin (NYSTOP) 100,000 unit/gram Powder, Apply once or twice daily to PREVENT rash under breasts  pantoprazole (PROTONIX) 40 mg Oral Tablet, Delayed Release (E.C.), Take 1 Tablet (40 mg total) by mouth Every morning before breakfast  tolterodine  (DETROL) 2 mg Oral Tablet, Take 2 mg by mouth Once a day  traZODone (DESYREL) 50 mg Oral Tablet, Take 1/2 to full tablet as needed for sleep.  triamcinolone acetonide (ARISTOCORT A) 0.1 % Ointment, by Apply Topically route Twice daily (Patient taking differently: by Apply Topically route Twice per day as needed )    No facility-administered medications prior to visit.      Allergies:   Allergies   Allergen Reactions    Cymbalta [Duloxetine]      Rash on feet and hands    Blue Dye      Told to avoid due to patch test result by [facility]    Flavoring Agent  Other Adverse Reaction (Add comment)     Patient unsure of reaction    Lipitor [Atorvastatin] Myalgia     Muscle pain    Nickel      Blisters  "Allergic to all kinds of metal"     Codeine Itching     RASH    Mobic [Meloxicam] Nausea/ Vomiting     Nervous and shakes     Naprosyn [Naproxen] Nausea/ Vomiting    Nsaids (Non-Steroidal Anti-Inflammatory Drug) Nausea/ Vomiting         OBJECTIVE  BP 132/78 (Site: Left, Patient Position: Sitting, Cuff Size: Adult)    Pulse (!) 103    Temp 36.4 C (97.5 F) (Thermal Scan)    Resp 20    Ht 1.676 m (5\' 6" )    Wt 96.1 kg (211 lb 13.8 oz)    SpO2 97%    BMI 34.20 kg/m       General: no distress when sitting, moderate distress with standing and ambulation  HENT: normocephalic, atraumatic, EOMs intact, nares patent, trachea midline, neck supple  Lungs: clear to auscultation bilaterally  Cardiovascular: RRR, no murmur  Spine: no visualized deformities, edema, or ecchymosis to thoracic or lumbar spine. Tenderness to palpation over proximal lumbar spine. Strength of lower extremities 5/5.   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      Matraca Hunkins, PA-C 07/17/2020, 10:53

## 2020-07-22 ENCOUNTER — Encounter (HOSPITAL_BASED_OUTPATIENT_CLINIC_OR_DEPARTMENT_OTHER): Payer: Self-pay | Admitting: Physician Assistant

## 2020-07-30 ENCOUNTER — Encounter (INDEPENDENT_AMBULATORY_CARE_PROVIDER_SITE_OTHER): Payer: Self-pay | Admitting: Optometrist

## 2020-07-30 ENCOUNTER — Other Ambulatory Visit: Payer: Self-pay

## 2020-07-30 ENCOUNTER — Ambulatory Visit: Payer: MEDICAID | Attending: Optometrist | Admitting: Optometrist

## 2020-07-30 DIAGNOSIS — H2513 Age-related nuclear cataract, bilateral: Secondary | ICD-10-CM

## 2020-07-30 DIAGNOSIS — H538 Other visual disturbances: Secondary | ICD-10-CM | POA: Insufficient documentation

## 2020-07-30 DIAGNOSIS — H5203 Hypermetropia, bilateral: Secondary | ICD-10-CM | POA: Insufficient documentation

## 2020-07-30 NOTE — Progress Notes (Addendum)
[facility]  [place] [address]  Operated by [facility]         Patient Name: [name]  MRN#: [id number]  [place]: Mar 01, 1956    Date of Service: 07/30/2020    Chief Complaint     Blurred Vision          [name] is a 64 y.o. female who presents today for evaluation/consultation of:  HPI     patient states for the past 2 months her vision in both eyes is blurry,even with the the glasses on. blurriness it comes and goes. No pain.     Last edited by Delfin Gant, COMT on 07/30/2020  9:42 AM. (History)        ROS     Positive for: Eyes    Negative for: Constitutional, Gastrointestinal, Neurological, Skin, Genitourinary, Musculoskeletal, HENT, Endocrine, Cardiovascular, Respiratory, Psychiatric, Allergic/Imm, Heme/Lymph    Last edited by Delfin Gant, COMT on 07/30/2020  9:42 AM. (History)         All other systems Negative    Erin E Wotring, COMT 07/30/2020, 09:42    MD Addition to HPI: Patient's glasses are 63 years old. She has been having blurry vision.           ENCOUNTER DIAGNOSES     ICD-10-CM   1. Age-related nuclear cataract of both eyes  H25.13   2. Blurred vision, bilateral  H53.8   3. Hyperopia, bilateral  H52.03     No orders of the defined types were placed in this encounter.      Ophthalmic Plan of Care:    1. NS OU   -Patient educated on today's findings  -mild cataracts, pre-surgical at this time  -Monitor again in 1 year     2. Blurred vision OU  3. Hyperopia  -Patient educated on today's findings  -New glasses Rx given today  -Ed. Patient on possible adaptation period to new Rx  -monitor yearly      Follow up:    I have asked [name] to follow up 1 year for [facility]           I have seen and examined the above patient. I discussed the above diagnoses listed in the assessment and the above ophthalmic plan of care with the patient and patient's family. All questions were answered. I reviewed and, when necessary,  made changes to the technician/resident note, documented ophthalmology exam, chief complaint, history of present illness, allergies, review of systems, past medical, past surgical, family and social history. I personally reviewed and interpreted all testing and/or imaging performed at this visit and agree with the resident's or fellow's interpretation. Any exceptions/additions are edited/noted in the relevant encounter fields.      Morton Stall, OD  07/30/2020, 10:25    Base Eye Exam     Visual Acuity (Snellen - Linear)       Right Left    Dist cc 20/40 20/60    Correction: Glasses          Tonometry (Tonopen, 9:54 AM)       Right Left    Pressure 12 13          Pupils       APD    Right None    Left None          Visual Fields       Right Left     Full  Full          Extraocular Movement       Right Left     Full Full          Neuro/Psych     Oriented x3: Yes    Mood/Affect: Normal          Dilation     Both eyes: 1.0% Mydriacyl, 2.5% Phenylephrine @ 9:54 AM            Slit Lamp and Fundus Exam     External Exam       Right Left    External Normal Normal          Slit Lamp Exam       Right Left    Lids/Lashes Normal Normal    Conjunctiva/Sclera White and quiet White and quiet    Cornea Clear Clear    Anterior Chamber Deep and quiet Deep and quiet    Iris Round and reactive Round and reactive    Lens trace NS trace NS          Fundus Exam       Right Left    Vitreous Normal Normal    Disc Normal Normal    C/D Ratio 0.35 0.3    Macula Normal Normal    Vessels Normal Normal    Periphery Normal Normal            Refraction     Manifest Refraction (Auto)       Sphere Cylinder Axis Dist VA    Right +1.75 +1.25 005 20/25    Left +1.25 +0.75 180 20/20          Cycloplegic Refraction       Sphere Cylinder Axis Dist VA Add    Right +1.75 +1.25 005 20/25 +2.50    Left +1.25 +0.75 180 20/20 +2.50          Final Rx       Sphere Cylinder Axis Dist VA Add    Right +1.75 +1.25 005 20/25 +2.50    Left +1.25 +0.75 180 20/20 +2.50     Expiration Date: 07/31/2022

## 2020-08-19 ENCOUNTER — Other Ambulatory Visit (HOSPITAL_BASED_OUTPATIENT_CLINIC_OR_DEPARTMENT_OTHER): Payer: Self-pay | Admitting: Family Medicine

## 2020-08-20 ENCOUNTER — Encounter (INDEPENDENT_AMBULATORY_CARE_PROVIDER_SITE_OTHER): Payer: Self-pay | Admitting: Neurology

## 2020-08-20 ENCOUNTER — Other Ambulatory Visit: Payer: Self-pay

## 2020-08-20 ENCOUNTER — Ambulatory Visit: Payer: MEDICAID | Attending: Neurology | Admitting: Neurology

## 2020-08-20 VITALS — BP 143/82 | HR 94 | Ht 65.0 in | Wt 211.4 lb

## 2020-08-20 DIAGNOSIS — M5090 Cervical disc disorder, unspecified, unspecified cervical region: Secondary | ICD-10-CM | POA: Insufficient documentation

## 2020-08-20 DIAGNOSIS — M542 Cervicalgia: Secondary | ICD-10-CM | POA: Insufficient documentation

## 2020-08-20 DIAGNOSIS — G5641 Causalgia of right upper limb: Secondary | ICD-10-CM

## 2020-08-23 NOTE — Progress Notes (Signed)
[facility]  Outpatient History and Physical    Date:  08/20/2020  Name: [name]  Age:  64 y.o.  Referring Physician:   Drema Balzarine, MD  [address]  [place],   [address]    History of Present Illness  History obtained from:  patient  History of Present Illness  History obtained from:  patient   PROGRESS NOTE    SUBJECTIVE:  [name] is a 64 year old right-handed Caucasian woman who comes to return visit at the neurology clinic. 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     Today she admits to recent swelling in the right submandibular region of her neck after a right hemithyroidectomy on April 05 2019 for a nodule on the right thyroid lobe. She also has shooting pain when turning her head to the right or left. 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 She has occasional pain in her right 5th digit and right 5th toe that improves with activity. She also mentioned having intermittent headaches that do not appear to be getting worse.     Pt mentioned that Psychiatry started her on Wellbutrin qAM last week.     There was no plexopathy, radiculopathy noted on the EMG nerve conduction study from July 2019. There was no evidence of median neuropathy or at the right wrist or right ulna neuropathy.     PAST MEDICAL HISTORY:  Depression.  Benign Thyroid Nodule  Sacroiliitis     PAST SURGICAL HISTORY:  Significant for left ulnar nerve repair surgery and surgery done for right parotid gland, hysterectomy, cholecystectomy, appendectomy,partial thyroid resection.  [name] is a 64 y.o. female who presents with a chief complaint of   Chief Complaint   Patient presents with    Follow Up        The  patient reports: complaint(s) of:  neck pain.     Past Medical History  Current Outpatient Medications   Medication Sig    aspirin (ECOTRIN) 81 mg Oral Tablet, Delayed Release (E.C.) Take 81 mg by mouth Once a day    Azelaic Acid 20 % Cream Apply topically Twice daily (Patient taking differently: Apply topically Twice per day as needed )    buPROPion (WELLBUTRIN XL) 300 mg extended release 24 hr tablet Take 1 Tablet (300 mg total) by mouth Once a day    cetirizine (ZYRTEC) 10 mg Oral Tablet Take 1 Tablet (10 mg total) by mouth Once a day    cholecalciferol, vitamin D3, (VITAMIN D) 25 mcg (1,000 unit) Oral Tablet Take 1 Tablet (1,000 Units total) by mouth Once a day for 90 days    erythromycin (ROMYCIN) 5 mg/gram (0.5 %) Ophthalmic Ointment Instill into right eye Twice daily    FLUoxetine 20 mg Oral Tablet TAKE 1 TABLET BY MOUTH TWICE A DAY    fluticasone propionate (FLONASE) 50 mcg/actuation Nasal Spray, Suspension 1 Spray by Each Nostril route Once a day    gabapentin (NEURONTIN) 600 mg Oral Tablet Take 1 Tablet (600 mg total) by mouth Twice daily    Halobetasol Propionate (ULTRAVATE) 0.05 % Cream Apply topically Twice per day as needed Apply  topically. Three times a week    hydrOXYzine  HCL (ATARAX) 50 mg Oral Tablet TAKE 1 TABLET BY MOUTH EVERY DAY AT NIGHT (Patient taking differently: Take 50 mg by mouth Every evening )    ipratropium-albuterol 0.5 mg-3 mg(2.5 mg base)/3 mL Solution for Nebulization 3 mL by Nebulization route Four times a day    Metronidazole (METROCREAM) 0.75 % Cream Apply topically Twice daily (Patient taking differently: Apply topically Twice per day as needed )    Metronidazole (METROCREAM) 0.75 % Cream Apply topically    mineral oil-hydrophil petrolat (AQUAPHOR) Ointment Apply topically Once per day as needed     Mirtazapine (REMERON) 7.5 mg Oral Tablet Take 1 Tablet (7.5 mg total) by mouth Every night    montelukast (SINGULAIR) 10 mg Oral Tablet Take 1 Tab (10 mg total) by  mouth Once a day    nitroGLYCERIN (NITROSTAT) 0.4 mg Sublingual Tablet, Sublingual 1 Tab (0.4 mg total) by Sublingual route Every 5 minutes as needed for Chest pain for up to 3 doses for 3 doses over 15 minutes    nystatin (NYSTOP) 100,000 unit/gram Powder Apply once or twice daily to PREVENT rash under breasts    pantoprazole (PROTONIX) 40 mg Oral Tablet, Delayed Release (E.C.) Take 1 Tablet (40 mg total) by mouth Once a day    tolterodine (DETROL) 2 mg Oral Tablet Take 2 mg by mouth Once a day    traZODone (DESYREL) 50 mg Oral Tablet Take 1/2 to full tablet as needed for sleep.    triamcinolone acetonide (ARISTOCORT A) 0.1 % Ointment by Apply Topically route Twice daily (Patient taking differently: by Apply Topically route Twice per day as needed )     Allergies   Allergen Reactions    Cymbalta [Duloxetine]      Rash on feet and hands    Blue Dye      Told to avoid due to patch test result by [facility] [place]    Flavoring Agent  Other Adverse Reaction (Add comment)     Patient unsure of reaction    Lipitor [Atorvastatin] Myalgia     Muscle pain    Nickel      Blisters  "Allergic to all kinds of metal"     Codeine Itching     RASH    Mobic [Meloxicam] Nausea/ Vomiting     Nervous and shakes     Naprosyn [Naproxen] Nausea/ Vomiting    Nsaids (Non-Steroidal Anti-Inflammatory Drug) Nausea/ Vomiting     Past Medical History:   Diagnosis Date    Anxiety     Arthritis     Cancer (CMS HCC)     internal skin cancer    Chronic pain     lower back    Depression     Dyspnea on exertion     Essential hypertension 02/16/2017    patient denies    GERD (gastroesophageal reflux disease)     controlled w/med    Headache     Heart murmur 1979    benign    Hyperlipidemia LDL goal < 100 08/29/2013    denies    MINOR CAD (coronary artery disease) 11/10/2012    no treatment other than cholesterol medications; follows regularly with Dr. 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    Vaginal prolapse 2012    surgery improved significantly    Wears glasses          Past Surgical History:   Procedure Laterality Date    COLONOSCOPY  01/06/09    COLONOSCOPY performed by Loletta Parish F at [facility]  03/05/2010    GASTROSCOPY performed by Delrae Alfred, SWATI at [facility]  03/05/2010    GASTROSCOPY WITH BIOPSY performed by Jocelyn Lamer at [facility]  2007    left distal fibula, casted    HX APPENDECTOMY      HX CHOLECYSTECTOMY      HX COLONOSCOPY      HX CYSTOCELE REPAIR  09/24/09    HX HAND SURGERY  2012    for Carpal tunnel     HX HEART CATHETERIZATION      HX OOPHORECTOMY      left ovary removed    HX PAROTIDECTOMY       "surgical clamp"     HX PARTIAL THYROIDECTOMY Right 04/05/2019    R hemithyroidectomy for nodule, final path follicular adenoma    HX TONSILLECTOMY      HX TOTAL VAGINAL HYSTERECTOMY  1979    HX WRIST FRACTURE TX  2007    [place] injury    PB REVISE ULNAR NERVE AT ELBOW Left 1979    PB UPPER GI ENDOSCOPY,BIOPSY  12/19/07    patulous GE junction zone, erythema, nonerosive GERD         Family Medical History:     Problem Relation (Age of Onset)    Bipolar Disorder Daughter, Paternal Uncle    Breast Cancer Paternal Aunt, Paternal 59, Paternal 75, Paternal 69, Paternal 65, Other    Cancer Paternal 43, Paternal 21, Paternal 25, Paternal 60, Paternal 28, Other (87)    Congestive Heart Failure Father (57)    Coronary Artery Disease Father, Mother (81)    Diabetes Mother    Heart Attack Father, Sister    High Cholesterol Mother    Hypertension (High Blood Pressure) Mother, Brother, Sister, Sister    Kidney Disease Sister    Leukemia Paternal Uncle    Stroke Paternal  Grandmother    Thyroid Disease Sister            Social History     Socioeconomic History    Marital status: Divorced     Spouse name: Not on file    Number of children: Not on file    Years of education: Not on file    Highest education level: Not on file   Occupational History    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     Employer: NOT EMPLOYED     Comment: occasionally   Tobacco Use    Smoking status: Former Smoker     Packs/day: 2.00     Years: 40.00     Pack years: 80.00     Types: Cigarettes     Quit date: 01/22/2005     Years since quitting: 15.5    Smokeless tobacco: Never Used   Vaping Use    Vaping Use: Never used   Substance and Sexual Activity    Alcohol use: No     Alcohol/week: 0.0 standard drinks  Drug use: No    Sexual activity: Yes     Partners: Male   Other Topics Concern    Uses Cane No    Uses walker No    Uses wheelchair No    Right hand dominant Yes    Left hand dominant No    Ambidextrous No    Ability to Walk 1 Flight of Steps without SOB/CP Yes    Routine Exercise Yes     Comment: walking 1 time weekly for 2 hours     Ability to Walk 2 Flight of Steps without SOB/CP No     Comment: shortness of breath, no cp     Unable to Ambulate No    Total Care No    Ability To Do Own ADL's Yes    Uses Walker No    Other Activity Level Yes     Comment: housework     Uses Cane No    Shift Work No    Unusual Sleep-Wake Schedule No     Social Determinants of Company secretary Strain:     Difficulty of Paying Living Expenses:    Food Insecurity:     Worried About Charity fundraiser in the Last Year:     Arboriculturist in the Last Year:    Transportation Needs:     Film/video editor (Medical):     Lack of Transportation (Non-Medical):    Physical Activity:     Days of Exercise per Week:     Minutes of Exercise per Session:    Stress:     Feeling of Stress :    Intimate Partner Violence:     Fear of Current or Ex-Partner:     Emotionally Abused:      Physically Abused:     Sexually Abused:        Review of Systems  Other than ROS in the HPI, all other systems were negative.    Examination:    Vitals: BP (!) 143/82    Pulse 94    Ht 1.651 m (5\' 5" )    Wt 95.9 kg (211 lb 6.7 oz)    BMI 35.18 kg/m       General: appears in good health HEENT: Atraumatic, no pallor, no icterus.  NECK: Significant tenderness with neck AROM/PROM in all directions; right neck edema  MUSCULOSKELETAL: tenderness with knee/shoulder AROM/PROM  SKIN: No rashes.  CARDIOVASCULAR: regular rhythm with no murmurs, gallops, or rubs  RESPIRATORY: lungs clear to auscultation bilaterally  NEUROLOGIC: She is alert and oriented to person, place, and date. Good recall. Good registration. Normal episodic, semantic, and procedural memory. No hallucinations or delusions. Mood was congruent. Speech is fluent and comprehensive. Fund of knowledge intact.  CRANIAL NERVES: Fundus:no disc pallor.Visual fields were normal. Pupils areequal and reactive to light. There is no INO no APD. Extraocular movements, normal full versions and ductions with normal pursuits and saccades. Normal facial sensation and strength in V2 and V3 but reduced sensation in V1 on the right. No dysarthria. Muscles of mastication normal. Tongue movements were normal. Normal hearing to finger rub. Cranial nerve 9 and 10 is intact gag reflex intact.Palate elevates in midline. Uvula in center.   Tone and bulk appear normal. Muscle Tone: Normal  COORDINATION:intact for finger to nose test, heel to shin test, tandem walk intact.  Muscle exam  Arm Right Left Leg Right Left   Deltoid 5/5 5/5 Iliopsoas 5/5 5/5  Biceps 5/5 5/5 Quads 5/5 5/5   Triceps 5/5 5/5 Hamstrings 5/5 5/5   Wrist Extension 5/5 5/5 Ankle Dorsi Flexion 5/5 5/5   Wrist Flexion 5/5 5/5 Ankle Plantar Flexion 5/5 5/5   Interossei 5-/5 5/5 Ankle Eversion 5/5 5/5   APB 5-/5 5/5 Ankle Inversion 5/5 5/5       Reflexes   RJ BJ TJ KJ AJ Plantars Hoffman's   Right 1+ 1+  1+ 2+ 2+ downgoing Not present   Left 1+ 1+ 1+ 2+ 2+ downgoing Not present   Sensory examination was intact for light touch, pinprick.vibration sensation and position sense was intact.          I have reviewed the following: images    Assessment and Plan    1. Neck pain    2. Complex regional pain syndrome type 2 of right upper extremity    3. Cervical disc disease      Orders Placed This Encounter    External Referral to Physical Therapy     [name] is a 64 year old right-handed Caucasian woman with a significant history of parotid gland tumor s/p surgery done and history of depression. She comes for follow up visit for  pain in the right forearmassociated with swelling. These symptoms have been ongoing for over a year. MRI imaging of the cervical spine, which was done  which showed disk bulge at C5-C6-C6-C7 indenting the thecal sac without significant cord impingement. As per neurosurgery recommendation to see if physical therapy improves her symptoms if not then she willl get ACDF cervical spine fusion surgery. She was advised to follow up with neurosurgery.Continue physical therapy.  On the day of the encounter, a total of  30 minutes was spent on this patient encounter including review of historical information, examination, documentation and post-visit activities.   Catalina Gravel, MD 08/20/2020, 12:35

## 2020-09-29 ENCOUNTER — Other Ambulatory Visit (HOSPITAL_BASED_OUTPATIENT_CLINIC_OR_DEPARTMENT_OTHER): Payer: Self-pay | Admitting: Physician Assistant

## 2020-09-29 DIAGNOSIS — N904 Leukoplakia of vulva: Secondary | ICD-10-CM

## 2020-09-30 ENCOUNTER — Ambulatory Visit (INDEPENDENT_AMBULATORY_CARE_PROVIDER_SITE_OTHER): Payer: MEDICAID | Admitting: Student in an Organized Health Care Education/Training Program

## 2020-09-30 ENCOUNTER — Other Ambulatory Visit: Payer: Self-pay

## 2020-09-30 VITALS — BP 140/91 | HR 96 | Ht 65.0 in | Wt 210.8 lb

## 2020-09-30 DIAGNOSIS — F411 Generalized anxiety disorder: Secondary | ICD-10-CM | POA: Insufficient documentation

## 2020-09-30 DIAGNOSIS — F332 Major depressive disorder, recurrent severe without psychotic features: Secondary | ICD-10-CM

## 2020-09-30 DIAGNOSIS — F4310 Post-traumatic stress disorder, unspecified: Secondary | ICD-10-CM

## 2020-09-30 DIAGNOSIS — F331 Major depressive disorder, recurrent, moderate: Secondary | ICD-10-CM

## 2020-09-30 MED ORDER — MIRTAZAPINE 15 MG TABLET
15.0000 mg | ORAL_TABLET | Freq: Every evening | ORAL | 2 refills | Status: DC
Start: 2020-09-30 — End: 2020-12-31

## 2020-09-30 MED ORDER — BUPROPION HCL XL 300 MG 24 HR TABLET, EXTENDED RELEASE
300.0000 mg | ORAL_TABLET | Freq: Every day | ORAL | 3 refills | Status: DC
Start: 2020-09-30 — End: 2020-12-31

## 2020-09-30 MED ORDER — FLUOXETINE 20 MG TABLET
ORAL_TABLET | ORAL | 3 refills | Status: DC
Start: 2020-09-30 — End: 2020-11-10
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
## 2020-10-03 ENCOUNTER — Encounter (INDEPENDENT_AMBULATORY_CARE_PROVIDER_SITE_OTHER): Payer: Self-pay | Admitting: Neurology

## 2020-10-03 ENCOUNTER — Other Ambulatory Visit (HOSPITAL_BASED_OUTPATIENT_CLINIC_OR_DEPARTMENT_OTHER): Payer: Self-pay | Admitting: Family Medicine

## 2020-10-03 DIAGNOSIS — E041 Nontoxic single thyroid nodule: Secondary | ICD-10-CM

## 2020-10-03 DIAGNOSIS — G518 Other disorders of facial nerve: Secondary | ICD-10-CM

## 2020-10-07 ENCOUNTER — Encounter (HOSPITAL_BASED_OUTPATIENT_CLINIC_OR_DEPARTMENT_OTHER): Payer: Self-pay | Admitting: Physician Assistant

## 2020-10-07 NOTE — Telephone Encounter (Signed)
Regarding: Hasis Pt  \  script inquiry  ----- Message from [place] sent at 10/06/2020 12:00 PM EST -----  Steffanie Dunn Hasis, PA-C    Pt stating their pharmacy won't fill their script for gabapentin (NEURONTIN) 600 mg Oral Tablet until "they hear from the doctor".  Please advise.    Thank you so very much for all your time and assistance!  Have a GREAT day!!!  --Estill Bamberg

## 2020-10-07 NOTE — Telephone Encounter (Signed)
Orders approved on 10/05/2020 as phone in. Called and spoke with [place] requesting Supervising provider information. Information provided.   Wayland Salinas, RN

## 2020-10-29 ENCOUNTER — Other Ambulatory Visit: Payer: Self-pay

## 2020-10-29 ENCOUNTER — Encounter (HOSPITAL_BASED_OUTPATIENT_CLINIC_OR_DEPARTMENT_OTHER): Payer: Self-pay | Admitting: Physician Assistant

## 2020-10-29 ENCOUNTER — Ambulatory Visit: Payer: MEDICAID | Attending: Physician Assistant | Admitting: Physician Assistant

## 2020-10-29 ENCOUNTER — Ambulatory Visit (HOSPITAL_BASED_OUTPATIENT_CLINIC_OR_DEPARTMENT_OTHER): Payer: MEDICAID

## 2020-10-29 VITALS — BP 120/82 | HR 120 | Temp 96.6°F | Ht 65.0 in | Wt 204.8 lb

## 2020-10-29 DIAGNOSIS — N3281 Overactive bladder: Secondary | ICD-10-CM

## 2020-10-29 DIAGNOSIS — R3915 Urgency of urination: Secondary | ICD-10-CM

## 2020-10-29 DIAGNOSIS — Z23 Encounter for immunization: Secondary | ICD-10-CM | POA: Insufficient documentation

## 2020-10-29 DIAGNOSIS — M5412 Radiculopathy, cervical region: Secondary | ICD-10-CM

## 2020-10-29 NOTE — Progress Notes (Signed)
Department of Family Medicine   Progress Note    Esmer Echard  MRN: [id number]  DOB: 1956/07/15  Date of Service: 10/29/2020    CHIEF COMPLAINT  Chief Complaint   Patient presents with   . Follow Up 6 Months       SUBJECTIVE  Calvina Weddington is a 65 y.o. female who presents to clinic for 6 month follow up.     Patient states she started having worsening pain following her first PT evaluation for chronic neck and back pain. She has since started having numbness in her left arm radiating into her hand and down the lateral aspect of her left leg radiating into her foot. She also reports having muscle spasms and "jerks." She says that it is worse when she is sitting and resting. She was previously following with neuro surgery and would like to go back for consultation.     She also has concerns with her bladder. She often has the urge to pee, but only a little comes out. States this only happens during the day. She states that she goes more frequently through the night, often waking up 2-3 times nightly. She feels she empties her bladder fully during those times, but she does note a strong urine odor. She denies pelvic pain, fevers, chills, nausea, and vomiting. Denies vaginal itching or burning.         Review of Systems:  Positive ROS discussed in HPI, otherwise all other systems negative.      Medications:   aspirin (ECOTRIN) 81 mg Oral Tablet, Delayed Release (E.C.), Take 81 mg by mouth Once a day  Azelaic Acid 20 % Cream, Apply topically Twice daily (Patient taking differently: Apply topically Twice per day as needed )  buPROPion (WELLBUTRIN XL) 300 mg extended release 24 hr tablet, Take 1 Tablet (300 mg total) by mouth Once a day  cetirizine (ZYRTEC) 10 mg Oral Tablet, Take 1 Tablet (10 mg total) by mouth Once a day  clobetasoL (TEMOVATE) 0.05 % Ointment, APPLY TOPICALLY ROUTE TWICE DAILY FOR 90 DAYS  erythromycin (ROMYCIN) 5 mg/gram (0.5 %) Ophthalmic Ointment, Instill into right eye Twice  daily  FLUoxetine 20 mg Oral Tablet, Take 3 tablets daily (60 mg total)  fluticasone propionate (FLONASE) 50 mcg/actuation Nasal Spray, Suspension, 1 Spray by Each Nostril route Once a day  gabapentin (NEURONTIN) 600 mg Oral Tablet, Take 1 Tablet (600 mg total) by mouth Twice daily  Halobetasol Propionate (ULTRAVATE) 0.05 % Cream, Apply topically Twice per day as needed Apply  topically. Three times a week  hydrOXYzine HCL (ATARAX) 50 mg Oral Tablet, TAKE 1 TABLET BY MOUTH EVERY DAY AT NIGHT (Patient taking differently: Take 50 mg by mouth Every evening )  ipratropium-albuterol 0.5 mg-3 mg(2.5 mg base)/3 mL Solution for Nebulization, 3 mL by Nebulization route Four times a day  Metronidazole (METROCREAM) 0.75 % Cream, Apply topically Twice daily (Patient taking differently: Apply topically Twice per day as needed )  Metronidazole (METROCREAM) 0.75 % Cream, Apply topically  mineral oil-hydrophil petrolat (AQUAPHOR) Ointment, Apply topically Once per day as needed  (Patient not taking: Reported on 10/29/2020 )  mirtazapine (REMERON) 15 mg Oral Tablet, Take 1 Tablet (15 mg total) by mouth Every night  montelukast (SINGULAIR) 10 mg Oral Tablet, Take 1 Tab (10 mg total) by mouth Once a day  nitroGLYCERIN (NITROSTAT) 0.4 mg Sublingual Tablet, Sublingual, 1 Tab (0.4 mg total) by Sublingual route Every 5 minutes as needed for Chest pain for up to  3 doses for 3 doses over 15 minutes  nystatin (NYSTOP) 100,000 unit/gram Powder, Apply once or twice daily to PREVENT rash under breasts  pantoprazole (PROTONIX) 40 mg Oral Tablet, Delayed Release (E.C.), Take 1 Tablet (40 mg total) by mouth Once a day  tolterodine (DETROL) 2 mg Oral Tablet, Take 2 mg by mouth Once a day  triamcinolone acetonide (ARISTOCORT A) 0.1 % Ointment, by Apply Topically route Twice daily (Patient taking differently: by Apply Topically route Twice per day as needed )    No facility-administered medications prior to visit.      Allergies:   Allergies   Allergen  Reactions   . Cymbalta [Duloxetine]      Rash on feet and hands   . Blue Dye      Told to avoid due to patch test result by Abundio Miu   . Flavoring Agent  Other Adverse Reaction (Add comment)     Patient unsure of reaction   . Lipitor [Atorvastatin] Myalgia     Muscle pain   . Nickel      Blisters  "Allergic to all kinds of metal"    . Codeine Itching     RASH   . Mobic [Meloxicam] Nausea/ Vomiting     Nervous and shakes    . Naprosyn [Naproxen] Nausea/ Vomiting   . Nsaids (Non-Steroidal Anti-Inflammatory Drug) Nausea/ Vomiting         OBJECTIVE  BP 120/82 (Site: Right, Patient Position: Sitting, Cuff Size: Adult)   Pulse (!) 120   Temp 35.9 C (96.6 F) (Thermal Scan)   Ht 1.651 m (5\' 5" )   Wt 92.9 kg (204 lb 12.9 oz)   SpO2 96%   BMI 34.08 kg/m       General: no distress  HENT: TMs clear, mouth mucous membranes moist, pharynx without injection or exudate   Lungs: clear to auscultation bilaterally  Cardiovascular: RRR, no murmur  Abdomen: soft, non tender, bowel sounds present  Extremities: no cyanosis or edema   Numbness and tingling elicited with abduction to 90 degrees of LUE  Skin: warm and dry, no rash  Neurologic: gait is normal, AOx3  Psychiatric: normal affect and behavior    ASSESSMENT/PLAN  (M54.12) Cervical radiculopathy  (primary encounter diagnosis)  Plan:   Chronic, worsening   XR CERVICAL SPINE COMPLETE (4 OR MORE VIEWS),         Refer to [facility]  Con't gabapentin 600 mg BID     (R39.15) Urgency of urination  (N32.81) Overactive bladder  Plan:   Chronic, worsening   OAB vs pelvic floor weakness   POCT Urine dipstick: clinically insignificant   Reports no longer taking detrol due to side effects   Encouraged regular voiding trials throughout the day  Refer to [facility] for repeat urodynamic studies         Return in about 6 months (around 04/28/2021), or if symptoms worsen or fail to improve.      Tenlee Wollin Hasis, PA-C 10/29/2020, 17:03

## 2020-10-29 NOTE — Nursing Note (Signed)
10/29/20 1700   Required: Location Test Performed At:   [facility] [address], [place], [place] [address]   Urine test  (S[employer] Multistix 10 SG)   Time collected 1735   Color (Ref Range: Yellow) Yellow   Clarity (Ref Range: Clear) Clear   Glucose (Ref Range: Negative mg/dL) Negative   Bilirubin (Ref Range: Negative mg/dL) Negative   Ketones (Ref Range: Negative mg/dL) (!) Small (15 mg/dl)   Urine Specific Gravity (Ref Range: 1.005 - 1.030) 1.030   Blood (urine) (Ref Range: Negative mg/dL) (!) Trace Hemolyzed   pH (Ref Range: 5.0 - 8.0) 6.0   Protein (Ref Range: Negative mg/dL) (!) Trace   Urobilinogen (Ref Range: Negative mg/dL) 0.2mg /dL (Normal)   Nitrite (Ref Range: Negative) Negative   Leukocytes (Ref Range: Negative WBC's/uL) Negative   Performed Status Manual   Bottle Number   (Siemens Multistix 10 SG) 11   Lot # [id number]   Expiration Date 06/24/21   Initials srw

## 2020-10-29 NOTE — Progress Notes (Deleted)
Department of Family Medicine   Progress Note    [name]  MRN: [id number]  DOB: [birth year]  Date of Service: 10/29/2020    CHIEF COMPLAINT  Chief Complaint   Patient presents with   . Follow Up 6 Months       SUBJECTIVE  [name] is a 65 y.o. female who presents to clinic for 6 month follow up.     Patient states she started having pain following her first PT evaluation. She has since starting having numbness on the left side of her body, mostly her left arm into her hand and down her left left into her foot. She also reports having muscle spasms and "jerks." She says that it is worse when she is sitting and resting. Her neck pain is constant. She was previously following with neuro surgery.     She also has concerns with her bladder. She often has the urge to pee, but only a little comes out. She notices if she adjust her posture, it will improve, but if she's hunched over, she cannot go. She attributes dysuria. She states that she does note a strong urine odor.             Review of Systems:  Positive ROS discussed in HPI, otherwise all other systems negative.      Medications:   aspirin (ECOTRIN) 81 mg Oral Tablet, Delayed Release (E.C.), Take 81 mg by mouth Once a day  Azelaic Acid 20 % Cream, Apply topically Twice daily (Patient taking differently: Apply topically Twice per day as needed )  buPROPion (WELLBUTRIN XL) 300 mg extended release 24 hr tablet, Take 1 Tablet (300 mg total) by mouth Once a day  cetirizine (ZYRTEC) 10 mg Oral Tablet, Take 1 Tablet (10 mg total) by mouth Once a day  clobetasoL (TEMOVATE) 0.05 % Ointment, APPLY TOPICALLY ROUTE TWICE DAILY FOR 90 DAYS  erythromycin (ROMYCIN) 5 mg/gram (0.5 %) Ophthalmic Ointment, Instill into right eye Twice daily  FLUoxetine 20 mg Oral Tablet, Take 3 tablets daily (60 mg total)  fluticasone propionate (FLONASE) 50 mcg/actuation Nasal Spray, Suspension, 1 Spray by Each Nostril route Once a day  gabapentin (NEURONTIN) 600 mg Oral Tablet,  Take 1 Tablet (600 mg total) by mouth Twice daily  Halobetasol Propionate (ULTRAVATE) 0.05 % Cream, Apply topically Twice per day as needed Apply  topically. Three times a week  hydrOXYzine HCL (ATARAX) 50 mg Oral Tablet, TAKE 1 TABLET BY MOUTH EVERY DAY AT NIGHT (Patient taking differently: Take 50 mg by mouth Every evening )  ipratropium-albuterol 0.5 mg-3 mg(2.5 mg base)/3 mL Solution for Nebulization, 3 mL by Nebulization route Four times a day  Metronidazole (METROCREAM) 0.75 % Cream, Apply topically Twice daily (Patient taking differently: Apply topically Twice per day as needed )  Metronidazole (METROCREAM) 0.75 % Cream, Apply topically  mineral oil-hydrophil petrolat (AQUAPHOR) Ointment, Apply topically Once per day as needed  (Patient not taking: Reported on 10/29/2020 )  mirtazapine (REMERON) 15 mg Oral Tablet, Take 1 Tablet (15 mg total) by mouth Every night  montelukast (SINGULAIR) 10 mg Oral Tablet, Take 1 Tab (10 mg total) by mouth Once a day  nitroGLYCERIN (NITROSTAT) 0.4 mg Sublingual Tablet, Sublingual, 1 Tab (0.4 mg total) by Sublingual route Every 5 minutes as needed for Chest pain for up to 3 doses for 3 doses over 15 minutes  nystatin (NYSTOP) 100,000 unit/gram Powder, Apply once or twice daily to PREVENT rash under breasts  pantoprazole (PROTONIX) 40 mg  Oral Tablet, Delayed Release (E.C.), Take 1 Tablet (40 mg total) by mouth Once a day  tolterodine (DETROL) 2 mg Oral Tablet, Take 2 mg by mouth Once a day  triamcinolone acetonide (ARISTOCORT A) 0.1 % Ointment, by Apply Topically route Twice daily (Patient taking differently: by Apply Topically route Twice per day as needed )    No facility-administered medications prior to visit.      Allergies:   Allergies   Allergen Reactions   . Cymbalta [Duloxetine]      Rash on feet and hands   . Blue Dye      Told to avoid due to patch test result by Simone Curia   . Flavoring Agent  Other Adverse Reaction (Add comment)     Patient unsure of reaction   .  Lipitor [Atorvastatin] Myalgia     Muscle pain   . Nickel      Blisters  "Allergic to all kinds of metal"    . Codeine Itching     RASH   . Mobic [Meloxicam] Nausea/ Vomiting     Nervous and shakes    . Naprosyn [Naproxen] Nausea/ Vomiting   . Nsaids (Non-Steroidal Anti-Inflammatory Drug) Nausea/ Vomiting         OBJECTIVE  There were no vitals taken for this visit.      General: no distress  HENT: TMs clear, mouth mucous membranes moist, pharynx without injection or exudate   Lungs: clear to auscultation bilaterally  Cardiovascular: RRR, no murmur  Abdomen: soft, non tender, bowel sounds present  Extremities: no cyanosis or edema  Skin: warm and dry, no rash  Neurologic: gait is normal, AOx3, CN 2-12 grossly intact  Psychiatric: normal affect and behavior    ASSESSMENT/PLAN  No diagnosis found.      No orders of the defined types were placed in this encounter.        No follow-ups on file.    On the day of the encounter, a total of  *** [id number], [id number], [id number]) minutes was spent on this patient encounter including review of historical information, examination, documentation and post-visit activities.     Kyngston Pickelsimer Hasis, PA-C 10/29/2020, 17:03

## 2020-10-29 NOTE — Patient Instructions (Signed)
VACCINE INFORMATION FACT SHEET FOR RECIPIENTS AND CAREGIVERS ABOUT COMIRNATY (COVID-19 VACCINE, mRNA)  AND PFIZER-BIONTECH COVID-19 VACCINE TO PREVENT CORONAVIRUS DISEASE 2019 (COVID-19) FOR USE IN INDIVIDUALS 64 YEARS OF AGE AND OLDER      You are being offered either COMIRNATY (COVID-19 Vaccine, mRNA) or the Pfizer-BioNTech COVID-19 Vaccine to prevent Coronavirus Disease 2019 (COVID-19) caused by SARS-CoV-2.    This Vaccine Information Fact Sheet for Recipients and Caregivers comprises the Fact Sheet for the authorized Pfizer-BioNTech COVID-19 Vaccine and also includes information about the FDA-licensed vaccine, COMIRNATY (COVID-19 Vaccine, mRNA) for use in individuals 70 years of age and older.    The FDA-approved COMIRNATY (COVID-19 Vaccine, mRNA) and the two formulations of Pfizer-BioNTech COVID-19 Vaccine authorized for Emergency Use Authorization (EUA) for individuals 43 years of age and older, when prepared according to their respective instructions for use, can be used interchangeably. [1]    COMIRNATY (COVID-19 Vaccine, mRNA) is an FDA-approved COVID-19 vaccine made by [employer] for [employer]. It is approved as a 2-dose series for prevention of COVID-19 in individuals 27 years of age and older. It is also authorized under EUA to provide:   a 2-dose primary series to individuals 12 through 64 years of age;   a third primary series dose to individuals 39 years of age and older who have been determined to have certain kinds of immunocompromise;   a single booster dose to individuals 36 years of age and older who have completed a primary series with Pfizer-BioNTech COVID-19 or [place]; and   a single booster does individuals 50 years of age and older who have completed primary vaccination with a different authorized COVID-19 vaccine. The booster schedule is based on the labeling information of the vaccine used for the primary series.    The Pfizer-BioNTech COVID-19 Vaccine has received EUA from  FDA to provide:   a 2-dose primary series to individuals 35 years of age and older;   a third primary series dose to individuals 29 years of age and older who have been determined to have certain kinds of immunocompromise;   a single booster dose in individuals 54 years of age and older who have completed a primary series with Pfizer-BioNTech COVID-19 Vaccine or COMIRNATY; and   a single booster dose to individuals 64 years of age and Albin Fischer have completed primary vaccination with a different authorized COVID-19 vaccine. Booster eligibility and schedule are based on the labeling information of the vaccine used for the primary series.    1 When prepared according to their respective instructions for use, the FDA-approved COMIRNATy (COVID-19 Vaccine, mRNA) and the two EUA-authorized formulations of Pfizer-BioNTech COVID-19 Vaccine for individuals 35 years of age and older can be used interchangeably without presenting any safety or effectiveness concerns.  _____________________________      This Vaccine Information Fact Sheet contains information to help you understand the risks and benefits of COMIRNATY (COVID-19 Vaccine, mRNA) and the Pfizer-BioNTech COVID-19 Vaccine, which you may receive because there is currently a pandemic of COVID-19. Talk to your vaccination provider if you have questions.    This Fact Sheet may have been updated. For the most recent Fact Sheet, please see [email or web address].hu.    WHAT YOU NEED TO KNOW BEFORE YOU GET THIS VACCINE    WHAT IS COVID-19?  COVID-19 disease is caused by a coronavirus called SARS-CoV-2. You can get COVID-19 through contact with another person who has the virus. It is predominantly a respiratory illness that can affect other organs. People with COVID-19 have  had a wide range of symptoms reported, ranging from mild symptoms to severe illness leading to death. Symptoms may appear 2 to 14 days after exposure to the virus. Symptoms may include: fever or  chills; cough; shortness of breath; fatigue; muscle or body aches; headache; new loss of taste or smell; sore throat; congestion or runny nose; nausea or vomiting; diarrhea.    WHAT IS COMIRNATY (COVID-19 VACCINE, mRNA) AND HOW IS IT RELATED TO THE PFIZER-BIONTECH COVID-19 VACCINE?    COMIRNATY (COVID-19 Vaccine, mRNA) and the Pfizer-BioNTech COVID-19 Vaccine when prepared according to their respective instructions for use, can be used interchangeably.    For more information on EUA, see the "What is an Emergency Use Authorization (EUA)?" section at the end of this Fact Sheet.    WHAT SHOULD YOU MENTION TO YOUR VACCINATION PROVIDER BEFORE YOU GET THE VACCINE?  Tell the vaccination provider about all of your medical conditions, including if you:   have any allergies   have had myocarditis (inflammation of the heart muscle) or pericarditis (inflammation of the lining outside the heart)   have a fever   have a bleeding disorder or are on a blood thinner   are immunocompromised or are on a medicine that affects your immune system   are pregnant or plan to become pregnant   are breastfeeding   have received another COVID-19 vaccine   have ever fainted in association with an injection    HOW IS THE VACCINE GIVEN?  The Pfizer-BioNTech COVID-19 Vaccine or COMIRNATY will be given to you as an injection into the muscle.    Primary Series: The vaccine is administered as a 2-dose series, 3 weeks apart. A third primary series dose may be administered at least 4 weeks after the second dose to individuals who are determined to have certain kinds of immunocompromise.    Booster Dose:   A single booster dose of the vaccine may be administered at least 6 months after completion of a primary series to individuals 8 years of age and older   A single booster dose of the vaccine may be administered to2 yearsof age and older who have completed primary vaccination with a different authorized COVID-19 vaccine. Please  check with your healthcare provider regarding eligibility for and timing of the booster dose.    The vaccine may not protect everyone.    WHO SHOULD NOT GET THE VACCINE?  You should not get the vaccine if you:   had a severe allergic reaction after a previous dose of this vaccine   had a severe allergic reaction to any ingredient of this vaccine.    WHAT ARE THE INGREDIENTS IN THE VACCINE?  COMIRNATY (COVID-19 Vaccine, mRNA) and the authorized formulations of the vaccine include the following ingredients:   mRNA, lipids ((4- hydroxybutyl)azanediyl)bis(hexane-6,1-diyl)bis(2-hexyldecanoate), 2 [(polyethylene glycol)-2000]-N,N-ditetradecylacetamide, 1,2-Distearoyl-sn-glycero-3-phosphocholine, and cholesterol), potassium chloride, monobasic potassium phosphate, sodium chloride, dibasic sodium phosphate dihydrate, and sucrose.    Pfizer-BioNTech COVID-19 vaccines for individuals 20 years of age and older contain 1 of the following sets of additional ingredients; ask the vaccination provider which version is being administered:   potassium chloride, monobasic potassium phosphate, sodium chloride, dibasic sodium phosphate dihydrate, and sucrose  OR   tromethamine, tromethamine hydrochloride and sucrose    COMIRNATY (COVID-19 Vaccine, mRNA) contains the following additional ingredients:  potassium chloride, monobasic potassium phosphate, sodium chloride, dibasic sodium phosphate dihydrate, and sucrose.    HAS THE VACCINE BEEN USED BEFORE?  Yes. In clinical trials, approximately 23,000 individuals 12 years  of age and older have received at least 1 dose of the vaccine. Data from these clinical trials supported the Emergency Use Authorization of the Pfizer-BioNTech COVID-19 Vaccine and the approval of COMIRNATY (COVID-19 Vaccine, mRNA). Millions of individuals have received the vaccine under EUA since October 05, 2019. The vaccine that is authorized for use in individuals 49 years of age and older includes two  formulations; one that was studied in clinical trials and used under EUA, and one with the same mRNA and lipids but different inactive ingredients. The use of the different inactive ingredients help stabilize the vaccine under refrigerated temperatures and the formulations can be administered without dilution.    WHAT ARE THE BENEFITS OF THE VACCINE?  The vaccine has been shown to prevent COVID-19.    The duration of protection against COVID-19 is currently unknown.    WHAT ARE THE RISKS OF THE VACCINE?  There is a remote chance that the vaccine could cause a severe allergic reaction. A severe allergic reaction would usually occur within a few minutes to one hour after getting a dose of the vaccine. For this reason, your vaccination provider may ask you to stay at the place where you received your vaccine for monitoring after vaccination.  Signs of a severe allergic reaction can include:   Difficulty breathing   Swelling of your face and throat   A fast heartbeat   A bad rash all over your body   Dizziness and weakness    Myocarditis (inflammation of the heart muscle) and pericarditis (inflammation of the lining outside the heart) have occurred in some people who have received the vaccine, more commonly in males under 90 years of age than among females and older males.In most of these people, symptoms began within a few days following receipt of the second dose of vaccine. The chance of having this occur is very low. You should seek  medical attention right away if you have any of the following symptoms after receiving the vaccine:   Chest pain   Shortness of breath   Feelings of having a fast-beating, fluttering, or pounding heart    Side effects that have been reported with the vaccine include:   severe allergic reactions   non-severe allergic reactions such as rash, itching, hives, or swelling of the face   myocarditis (inflammation of the heart muscle)   pericarditis (inflammation of the  lining outside the heart)   injection site pain   tiredness   headache   muscle pain   chills   joint pain   fever   injection site swelling   injection site redness   nausea   feeling unwell   swollen lymph nodes (lymphadenopathy)   decreased appetite   diarrhea   vomiting   arm pain   fainting in association with injection of the vaccine    These may not be all the possible side effects of the vaccine. Serious and unexpected side effects may occur. The possible side effects of the vaccine are still being studied in clinical trials.    WHAT SHOULD I DO ABOUT SIDE EFFECTS?  If you experience a severe allergic reaction, call [phone], or go to the nearest hospital.    Call the vaccination provider or your healthcare provider if you have any side effects that bother you or do not go away.    Report vaccine side effects to FDA/CDC Vaccine Adverse Event Reporting System (VAERS). The VAERS toll-free number is 936-468-6151 or report online to [email or web address].[email or web address]/.  Please include either "COMIRNATY (COVID-19 Vaccine, mRNA)" or "Pfizer-BioNTech COVID-19 Vaccine EUA", as appropriate, in the first line of box #18 of the report form.  In addition, you can report side effects to [employer]. at the contact information provided below.        You may also be given an option to enroll in v-safe. V-safe is a new voluntary smartphone-based tool that uses text messaging and web surveys to check in with people who have been vaccinated to identify potential side effects after COVID-19 vaccination. V-safe asks questions that help CDC monitor the safety of COVID-19 vaccines. V-safe also provides second-dose reminders if needed and live telephone follow-up by CDC if participants report a significant health impact following COVID-19 vaccination. For more information on how to sign up, visit: [email or web address].    WHAT IF I DECIDE NOT TO GET COMIRNATY (COVID-19 VACCINE, mRNA) OR THE PFIZER-BIONTECH  COVID-19 VACCINE?  Under the EUA, it is your choice to receive or not receive the vaccine. Should you decide not to receive it, it will not change your standard medical care.    ARE OTHER CHOICES AVAILABLE FOR PREVENTING COVID-19 BESIDES COMIRNATY (COVID-19 VACCINE, mRNA) OR PFIZER-BIONTECH COVID-19 VACCINE?  Other vaccines to prevent COVID-19 may be available under Emergency Use Authorization.    CAN I RECEIVE THE COMIRNATY (COVID-19 VACCINE, mRNA) OR PFIZER-BIONTECH COVID-19 VACCINE AT THE SAME TIME AS OTHER VACCINES?  Data have not yet been submitted to FDA on administration of COMIRNATY (COVID-19 Vaccine, mRNA) or the Pfizer-BioNTech COVID-19 Vaccine at the same time with other vaccines. If you are considering receiving COMIRNATY (COVID-19 Vaccine, mRNA) or the Pfizer-BioNTech COVID-19 Vaccine with other vaccines, discuss your options with your healthcare provider.    WHAT IF I AM IMMUNOCOMPROMISED?  If you are immunocompromised, you may receive a third dose of the vaccine. The third dose may still not provide full immunity to COVID-19 in people who are immunocompromised, and you should continue to maintain physical precautions to help prevent COVID-19. In addition, your close contacts should be vaccinated as appropriate.    WHAT IF I AM PREGNANT OR BREASTFEEDING?  If you are pregnant or breastfeeding, discuss your options with your healthcare provider.    WILL THE VACCINE GIVE ME COVID-19?  No. The vaccine does not contain SARS-CoV-2 and cannot give you COVID-19.    KEEP YOUR VACCINATION CARD  When you get your first dose, you will get a vaccination card to show you when to return for your next dose(s) of the vaccine. Remember to bring your card when you return.    ADDITIONAL INFORMATION  If you have questions, visit the website or call the telephone number provided below. To access the most recent Fact Sheets, please scan the QR code provided below.        HOW CAN I LEARN MORE?   Ask the vaccination  provider.  Visit CDC at BeginnerSteps.be.  Visit FDA at DirectoryExclusive.com.cy- legal-regulatory-and-policy-framework/emergency-use-authorization.   Contact your local or state public health department.    WHERE WILL MY VACCINATION INFORMATION BE RECORDED?  The vaccination provider may include your vaccination information in your state/local jurisdiction's Immunization Information System (IIS) or other designated system. This will ensure that you receive the same vaccine when you return for the second dose. For more information about IISs visit: [email or web address].    CAN I BE CHARGED AN ADMINISTRATION FEE FOR RECEIPT OF THE COVID-19 VACCINE?  No. At this time, the provider cannot charge you for a vaccine dose and you cannot  be charged an out-of-pocket vaccine administration fee or any other fee if only receiving a COVID-19 vaccination. However, vaccination providers may seek appropriate reimbursement from a program or plan that covers COVID-19 vaccine administration fees for the vaccine recipient (private insurance, Medicare, Medicaid, [place] [HRSA] COVID-19 Uninsured Program for non- insured recipients).    WHERE CAN I REPORT CASES OF SUSPECTED FRAUD?  Individuals becoming aware of any potential violations of the CDC COVID-19 Vaccination Program requirements are encouraged to report them to the Office of the [employer], U.S. Department of Health and [employer], at 1-800-HHS-TIPS or https://TIPS.HHS.GOV.    WHAT IS THE COUNTERMEASURES INJURY COMPENSATION PROGRAM?  The Countermeasures Injury Compensation Program (CICP) is a federal program that may help pay for costs of medical care and other specific expenses of certain people who have been seriously injured by certain medicines or vaccines, including this vaccine. Generally, a claim must be submitted to  the CICP within one (1) year from the date of receiving the vaccine. To learn more about this program, visit https://www.bennett.info/ or call 854-492-1111.    WHAT IS AN EMERGENCY USE AUTHORIZATION (EUA)?  An Emergency Use Authorization (EUA) is a mechanism to facilitate the availability and use of medical products, including vaccines, during public health emergencies, such as the current COVID-19 pandemic. An EUA is supported by a Risk manager (HHS) declaration that circumstances exist to justify the emergency use of drugs and biological products during the COVID-19 pandemic.    The FDA may issue an EUA when certain criteria are met, which includes that there are no adequate, approved, available alternatives. In addition, the FDA decision is based on the totality of scientific evidence available showing that the product may be effective to prevent COVID-19 during the COVID-19 pandemic and that the known and potential benefits of the product outweigh the known and potential risks of the product. All of these criteria must be met to allow for the product to be used in the treatment of patients during the COVID-19 pandemic.    This EUA for the Pfizer-BioNTech COVID-19 Vaccine and COMIRNATY will end when the Secretary of HHS determines that the circumstances justifying the EUA no longer  exist or when there is a change in the approval status of the product such that an EUA is no longer needed.        Manufactured by  [employer] [place] [address]      Manufactured for  [place] [address]  [place], [place] [id number]  Revised: 9December 2021

## 2020-11-08 ENCOUNTER — Other Ambulatory Visit (HOSPITAL_BASED_OUTPATIENT_CLINIC_OR_DEPARTMENT_OTHER): Payer: Self-pay

## 2020-11-08 ENCOUNTER — Other Ambulatory Visit (HOSPITAL_COMMUNITY): Payer: Self-pay | Admitting: Student in an Organized Health Care Education/Training Program

## 2020-11-08 DIAGNOSIS — E041 Nontoxic single thyroid nodule: Secondary | ICD-10-CM

## 2020-11-08 DIAGNOSIS — G518 Other disorders of facial nerve: Secondary | ICD-10-CM

## 2020-11-08 DIAGNOSIS — F331 Major depressive disorder, recurrent, moderate: Secondary | ICD-10-CM

## 2020-11-10 ENCOUNTER — Other Ambulatory Visit (HOSPITAL_BASED_OUTPATIENT_CLINIC_OR_DEPARTMENT_OTHER): Payer: Self-pay

## 2020-11-10 ENCOUNTER — Other Ambulatory Visit (HOSPITAL_COMMUNITY): Payer: Self-pay | Admitting: Student in an Organized Health Care Education/Training Program

## 2020-11-10 DIAGNOSIS — E041 Nontoxic single thyroid nodule: Secondary | ICD-10-CM

## 2020-11-10 DIAGNOSIS — F331 Major depressive disorder, recurrent, moderate: Secondary | ICD-10-CM

## 2020-11-10 DIAGNOSIS — G518 Other disorders of facial nerve: Secondary | ICD-10-CM

## 2020-11-10 MED ORDER — FLUOXETINE 20 MG TABLET
ORAL_TABLET | ORAL | 3 refills | Status: DC
Start: 2020-11-10 — End: 2020-12-31
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
## 2020-11-11 ENCOUNTER — Encounter (HOSPITAL_COMMUNITY): Payer: MEDICAID | Admitting: Student in an Organized Health Care Education/Training Program

## 2020-11-11 MED ORDER — GABAPENTIN 600 MG TABLET
600.0000 mg | ORAL_TABLET | Freq: Two times a day (BID) | ORAL | 0 refills | Status: DC
Start: 2020-11-11 — End: 2021-02-03

## 2020-11-17 ENCOUNTER — Ambulatory Visit: Payer: MEDICAID

## 2020-11-25 ENCOUNTER — Encounter (HOSPITAL_COMMUNITY): Payer: MEDICAID | Admitting: Student in an Organized Health Care Education/Training Program

## 2020-11-27 ENCOUNTER — Encounter (INDEPENDENT_AMBULATORY_CARE_PROVIDER_SITE_OTHER): Payer: Self-pay | Admitting: Urology

## 2020-11-27 ENCOUNTER — Other Ambulatory Visit: Payer: Self-pay

## 2020-11-27 ENCOUNTER — Ambulatory Visit: Payer: MEDICAID | Attending: Physician Assistant | Admitting: Urology

## 2020-11-27 VITALS — BP 116/72 | HR 55 | Temp 97.2°F | Ht 65.0 in | Wt 206.1 lb

## 2020-11-27 DIAGNOSIS — R300 Dysuria: Secondary | ICD-10-CM

## 2020-11-27 DIAGNOSIS — R3915 Urgency of urination: Secondary | ICD-10-CM | POA: Insufficient documentation

## 2020-11-27 DIAGNOSIS — N3281 Overactive bladder: Secondary | ICD-10-CM

## 2020-11-27 LAB — URINALYSIS, MACROSCOPIC
BILIRUBIN: NEGATIVE mg/dL
BLOOD: NEGATIVE mg/dL
COLOR: NORMAL
GLUCOSE: NEGATIVE mg/dL
KETONES: NEGATIVE mg/dL
NITRITE: NEGATIVE
PH: 6 (ref 5.0–8.0)
PROTEIN: 30 mg/dL — AB
SPECIFIC GRAVITY: 1.025 (ref 1.005–1.030)
UROBILINOGEN: NEGATIVE mg/dL

## 2020-11-27 LAB — URINALYSIS, MICROSCOPIC
RBCS: 0 /hpf (ref <6.0)
WBCS: 26 /hpf — ABNORMAL HIGH (ref <11.0)

## 2020-11-27 MED ORDER — SULFAMETHOXAZOLE 800 MG-TRIMETHOPRIM 160 MG TABLET
1.0000 | ORAL_TABLET | Freq: Two times a day (BID) | ORAL | 0 refills | Status: DC
Start: 2020-11-27 — End: 2020-12-31

## 2020-11-27 NOTE — Progress Notes (Signed)
[facility]     PATIENT NAME: [name] NUMBER: [id number]   DATE OF SERVICE: 11/27/2020   DATE OF BIRTH: 12/22/1955      Chief Complaint:   Chief Complaint   Patient presents with    Overactive Bladder    Urinary Urgency       HPI: [name] is a 65 y.o. female here today for bothersome urinary symptoms. She has been seen previously and underwent CMG in 2019 remearkable for small capacity overactive bladder. She was on various anticholinergics for some time but has not been for the last several months. She has had resolution of her urinary frequency and nocturia over the last year or so and has not been bothered by symptoms. This was the case up until the last 2 weeks where she started experiencing dysuria and more urinary frequency. She denies any fevers or gross hematuria. If it were not for the recent onset of dysuria she would be happy with symptoms.     I personally reviewed outside records and summarized in HPI      PMHx:   Past Medical History:   Diagnosis Date    Anxiety     Arthritis     Cancer (CMS [facility])     internal skin cancer    Chronic pain     lower back    Depression     Dyspnea on exertion     Essential hypertension 02/16/2017    patient denies    GERD (gastroesophageal reflux disease)     controlled w/med    Headache     Heart murmur 1979    benign    Hyperlipidemia LDL goal < 100 08/29/2013    denies    MINOR CAD (coronary artery disease) 11/10/2012    no treatment other than cholesterol medications; follows regularly with Dr. 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    Vaginal prolapse 2012    surgery improved significantly    Wears glasses            PSHx:   Past Surgical  History:   Procedure Laterality Date    COLONOSCOPY  01/06/09    COLONOSCOPY performed by Loletta Parish F at [place]  03/05/2010    GASTROSCOPY performed by Delrae Alfred, SWATI at [facility]  03/05/2010    GASTROSCOPY WITH BIOPSY performed by Jocelyn Lamer at [facility]  2007    left distal fibula, casted    HX APPENDECTOMY      HX CHOLECYSTECTOMY      HX COLONOSCOPY      HX CYSTOCELE REPAIR  09/24/09    HX HAND SURGERY  2012    for Carpal tunnel     HX HEART CATHETERIZATION      HX OOPHORECTOMY      left ovary removed    HX PAROTIDECTOMY       "  surgical clamp"     HX PARTIAL THYROIDECTOMY Right 04/05/2019    R hemithyroidectomy for nodule, final path follicular adenoma    HX TONSILLECTOMY      HX TOTAL VAGINAL HYSTERECTOMY  1979    HX WRIST FRACTURE [place]  2007    [place] injury    PB REVISE ULNAR NERVE AT ELBOW Left 1979    PB UPPER GI ENDOSCOPY,BIOPSY  12/19/07    patulous GE junction zone, erythema, nonerosive GERD           Family Hx: Marland Kitchen  Family Medical History:       Problem Relation (Age of Onset)    Bipolar Disorder Daughter, Paternal Uncle    Breast Cancer Paternal Aunt, Paternal 9, Paternal 69, Paternal 51, Paternal 43, Other    Cancer Paternal 36, Paternal 6, Paternal 56, Paternal 66, Paternal 47, Other (22)    Congestive Heart Failure Father (35)    Coronary Artery Disease Father, Mother (18)    Diabetes Mother    Heart Attack Father, Sister    High Cholesterol Mother    Hypertension (High Blood Pressure) Mother, Brother, Sister, Sister    Kidney Disease Sister    Leukemia Paternal Uncle    Stroke Paternal Grandmother    Thyroid Disease Sister              Social Hx:   Social History     Socioeconomic History    Marital status: Divorced     Spouse name: Not on file    Number of children: Not on file    Years of education: Not on file    Highest education level: Not on file   Occupational History    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     Employer: NOT EMPLOYED     Comment: occasionally   Tobacco Use    Smoking status: Former Smoker     Packs/day: 2.00     Years: 40.00     Pack years: 80.00     Types: Cigarettes     Quit date: 01/22/2005     Years since quitting: 15.8    Smokeless tobacco: Never Used   Vaping Use    Vaping Use: Never used   Substance and Sexual Activity    Alcohol use: No     Alcohol/week: 0.0 standard drinks    Drug use: No    Sexual activity: Yes     Partners: Male   Other Topics Concern    Abuse/Domestic Violence Not Asked    Breast Self Exam Not Asked    Caffeine Concern Not Asked    Calcium intake adequate Not Asked    Computer Use Not Asked    Exercise Concern Not Asked    Helmet Use Not Asked    Seat Belt Not Asked    Special Diet Not Asked    Sunscreen used Not Asked    Uses Cane No    Uses walker No    Uses wheelchair No    Right hand dominant Yes    Left hand dominant No    Ambidextrous No    Ability to Walk 1 Flight of Steps without SOB/CP Yes    Routine Exercise Yes     Comment: walking 1 time weekly for 2 hours     Ability to Walk 2 Flight of Steps without SOB/CP No     Comment: shortness of breath, no cp     Unable to Ambulate No    Total Care  No    Ability To Do Own ADL's Yes    Uses Walker No    Other Activity Level Yes     Comment: housework     Uses Cane No    Drives Not Asked    Shift Work No    Unusual Sleep-Wake Schedule No   Social History Narrative    Not on file     Social Determinants of Health     Financial Resource Strain: Not on file   Food Insecurity: Not on file   Transportation Needs: Not on file   Physical Activity: Not on file   Stress: Not on file   Intimate Partner Violence: Not on file   Housing Stability: Not on file       Medications:   Current Outpatient Medications   Medication Sig    aspirin (ECOTRIN) 81 mg Oral Tablet, Delayed Release (E.C.) Take 81 mg by mouth Once a day    Azelaic Acid 20 % Cream Apply topically Twice daily (Patient taking differently: Apply  topically Twice per day as needed)    buPROPion (WELLBUTRIN XL) 300 mg extended release 24 hr tablet Take 1 Tablet (300 mg total) by mouth Once a day    cetirizine (ZYRTEC) 10 mg Oral Tablet Take 1 Tablet (10 mg total) by mouth Once a day    clobetasoL (TEMOVATE) 0.05 % Ointment APPLY TOPICALLY ROUTE TWICE DAILY FOR 90 DAYS    erythromycin (ROMYCIN) 5 mg/gram (0.5 %) Ophthalmic Ointment Instill into right eye Twice daily    FLUoxetine 20 mg Oral Tablet Take 3 tablets daily (60 mg total)    fluticasone propionate (FLONASE) 50 mcg/actuation Nasal Spray, Suspension 1 Spray by Each Nostril route Once a day    gabapentin (NEURONTIN) 600 mg Oral Tablet Take 1 Tablet (600 mg total) by mouth Twice daily    Halobetasol Propionate (ULTRAVATE) 0.05 % Cream Apply topically Twice per day as needed Apply  topically. Three times a week    hydrOXYzine HCL (ATARAX) 50 mg Oral Tablet TAKE 1 TABLET BY MOUTH EVERY DAY AT NIGHT (Patient taking differently: Take 50 mg by mouth Every evening)    ipratropium-albuterol 0.5 mg-3 mg(2.5 mg base)/3 mL Solution for Nebulization 3 mL by Nebulization route Four times a day    Metronidazole (METROCREAM) 0.75 % Cream Apply topically Twice daily (Patient taking differently: Apply topically Twice per day as needed )    Metronidazole (METROCREAM) 0.75 % Cream Apply topically    mineral oil-hydrophil petrolat (AQUAPHOR) Ointment Apply topically Once per day as needed  (Patient not taking: No sig reported)    mirtazapine (REMERON) 15 mg Oral Tablet Take 1 Tablet (15 mg total) by mouth Every night    montelukast (SINGULAIR) 10 mg Oral Tablet Take 1 Tab (10 mg total) by mouth Once a day    nitroGLYCERIN (NITROSTAT) 0.4 mg Sublingual Tablet, Sublingual 1 Tab (0.4 mg total) by Sublingual route Every 5 minutes as needed for Chest pain for up to 3 doses for 3 doses over 15 minutes    nystatin (NYSTOP) 100,000 unit/gram Powder Apply once or twice daily to PREVENT rash under breasts    pantoprazole (PROTONIX)  40 mg Oral Tablet, Delayed Release (E.C.) Take 1 Tablet (40 mg total) by mouth Once a day    tolterodine (DETROL) 2 mg Oral Tablet Take 2 mg by mouth Once a day    triamcinolone acetonide (ARISTOCORT A) 0.1 % Ointment by Apply Topically route Twice daily (Patient taking differently: Apply  topically Twice per day as needed)    trimethoprim-sulfamethoxazole (BACTRIM DS) 160-800mg  per tablet Take 1 Tablet (160 mg total) by mouth Twice daily for 5 days       Allergies:   Allergies   Allergen Reactions    Cymbalta [Duloxetine]      Rash on feet and hands    Blue Dye      Told to avoid due to patch test result by [facility]    Flavoring Agent  Other Adverse Reaction (Add comment)     Patient unsure of reaction    Lipitor [Atorvastatin] Myalgia     Muscle pain    Nickel      Blisters  "Allergic to all kinds of metal"     Codeine Itching     RASH    Mobic [Meloxicam] Nausea/ Vomiting     Nervous and shakes     Naprosyn [Naproxen] Nausea/ Vomiting    Nsaids (Non-Steroidal Anti-Inflammatory Drug) Nausea/ Vomiting         ROS:  There were pertinent positives in:  GU.  All other systems are negative.      Physical Exam:  BP 116/72   Pulse 55   Temp 36.2 C (97.2 F) (Thermal Scan)   Ht 1.651 m (5\' 5" )   Wt 93.5 kg (206 lb 2.1 oz)   SpO2 92%   BMI 34.30 kg/m       General - alert/oriented; NAD  Eyes - conjunctiva clear   HENT - NCAT   Lungs - Unlabored breathing   GI - soft, NT/ND. no CVAT.  Musculoskeletal - FROM in all 4 extremities  Neurological - CN II-XII Grossly intact  GU system - no SP fullness or tenderness   Skin - warm and dry   Psych - Normal affect    Urine Dip Results:  Urine Dip Results:   Time collected: 0937  Glucose (Ref Range: Negative mg/dL): Negative  Bilirubin (Ref Range: Negative mg/dL): Negative  Ketones (Ref Range: Negative mg/dL): Negative  Urine Specific Gravity (Ref Range: 1.005 - 1.030): 1.015  Blood (urine) (Ref Range: Negative mg/dL): (!) Trace Non Hemolyzed   pH (Ref Range: 5.0 - 8.0):  6.5  Protein (Ref Range: Negative mg/dL): Negative  Urobilinogen (Ref Range: Negative mg/dL): 0.2mg /dL (Normal)  Nitrite (Ref Range: Negative): Negative  Leukocytes (Ref Range: Negative WBC's/uL): (!) Small      Assessment:  65 y.o. female with     1. Dysuria   2. Pyuria   3. Bothersome LUTS    Plan:  -discussed with the patient that she does appear to be emptying her bladder well with PVR of 0 today   -her UA is indicative of some inflammation with leukocytes present indicative of a possible UTI with her associated dysuria   -we will plan to treat her for UTI--bactrim DS x 5 days prescribed to pharmacy   -urine sent for culture and microscopic analysis today given trace blood on dipstick   -no need for anticholinergic at this point given her lack of bother prior to dysuria in setting of pyuria   -will see back following treatment to see if symptoms resolved, if not will assess further   -will follow up urine studies and address hematuria as necessary if it persists     Shalaya Swailes  was offered the opportunity to voice questions or concerns regarding today's visit, the plan of care set forth to them by provider, and actions that have been or will be  taken for them on their behalf. [name] did not have any questions or concerns at the end of the visit today, and furthermore, Jasmin Winberry was satisfied with the above mentioned plan of care and follow up. Rylinn Linzy is agreeable to the above plan of care and will follow up with urology or his PCP sooner should their condition worsen or the need arise.     Edythe Clarity, MD 11/27/2020, 09:38      I saw and examined the patient.  I reviewed the resident's note.  I agree with the findings and plan of care as documented in the resident's note.  Any exceptions/additions are edited/noted.    Thurmon Fair, MD  Raymond Gurney., MD, FACS  Director, Division of General Urology  Associate Residency Program Director  Associate Professor of  Urology  [facility] Department of Urology

## 2020-11-28 LAB — URINE CULTURE: URINE CULTURE: NO GROWTH

## 2020-12-01 ENCOUNTER — Other Ambulatory Visit: Payer: Self-pay

## 2020-12-01 ENCOUNTER — Encounter (INDEPENDENT_AMBULATORY_CARE_PROVIDER_SITE_OTHER): Payer: Self-pay | Admitting: PHYSICIAN ASSISTANT

## 2020-12-01 ENCOUNTER — Ambulatory Visit: Payer: MEDICAID | Attending: Physician Assistant | Admitting: PHYSICIAN ASSISTANT

## 2020-12-01 ENCOUNTER — Ambulatory Visit (HOSPITAL_BASED_OUTPATIENT_CLINIC_OR_DEPARTMENT_OTHER)
Admission: RE | Admit: 2020-12-01 | Discharge: 2020-12-01 | Disposition: A | Discharge status: Home / Self Care | Payer: MEDICAID | Source: Ambulatory Visit

## 2020-12-01 VITALS — BP 134/87 | HR 63 | Temp 98.3°F | Ht 65.0 in | Wt 203.5 lb

## 2020-12-01 DIAGNOSIS — M47812 Spondylosis without myelopathy or radiculopathy, cervical region: Secondary | ICD-10-CM | POA: Insufficient documentation

## 2020-12-01 DIAGNOSIS — M47892 Other spondylosis, cervical region: Secondary | ICD-10-CM

## 2020-12-01 DIAGNOSIS — M542 Cervicalgia: Secondary | ICD-10-CM | POA: Insufficient documentation

## 2020-12-01 DIAGNOSIS — M5412 Radiculopathy, cervical region: Secondary | ICD-10-CM
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
## 2020-12-04 ENCOUNTER — Encounter (INDEPENDENT_AMBULATORY_CARE_PROVIDER_SITE_OTHER): Payer: Self-pay | Admitting: PHYSICIAN ASSISTANT

## 2020-12-11 ENCOUNTER — Encounter (INDEPENDENT_AMBULATORY_CARE_PROVIDER_SITE_OTHER): Payer: Self-pay | Admitting: Urology

## 2020-12-22 ENCOUNTER — Other Ambulatory Visit: Payer: Self-pay

## 2020-12-22 ENCOUNTER — Ambulatory Visit
Admission: RE | Admit: 2020-12-22 | Discharge: 2020-12-22 | Disposition: A | Discharge status: Home / Self Care | Payer: MEDICAID | Source: Ambulatory Visit | Attending: PHYSICIAN ASSISTANT | Admitting: PHYSICIAN ASSISTANT

## 2020-12-22 DIAGNOSIS — M47812 Spondylosis without myelopathy or radiculopathy, cervical region: Secondary | ICD-10-CM

## 2020-12-22 DIAGNOSIS — M4802 Spinal stenosis, cervical region: Secondary | ICD-10-CM

## 2020-12-22 DIAGNOSIS — M542 Cervicalgia: Secondary | ICD-10-CM

## 2020-12-22 DIAGNOSIS — M5412 Radiculopathy, cervical region: Secondary | ICD-10-CM

## 2020-12-23 ENCOUNTER — Encounter (INDEPENDENT_AMBULATORY_CARE_PROVIDER_SITE_OTHER): Payer: Self-pay | Admitting: Anesthesiology

## 2020-12-23 ENCOUNTER — Ambulatory Visit: Payer: MEDICAID | Attending: Family | Admitting: Anesthesiology

## 2020-12-23 VITALS — BP 122/78 | HR 68 | Temp 97.5°F | Ht 65.0 in | Wt 204.4 lb

## 2020-12-23 DIAGNOSIS — M47812 Spondylosis without myelopathy or radiculopathy, cervical region: Secondary | ICD-10-CM | POA: Insufficient documentation

## 2020-12-23 DIAGNOSIS — M542 Cervicalgia: Secondary | ICD-10-CM | POA: Insufficient documentation

## 2020-12-23 DIAGNOSIS — M5412 Radiculopathy, cervical region: Secondary | ICD-10-CM | POA: Insufficient documentation
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
## 2020-12-31 ENCOUNTER — Ambulatory Visit (INDEPENDENT_AMBULATORY_CARE_PROVIDER_SITE_OTHER): Payer: MEDICAID | Admitting: Student in an Organized Health Care Education/Training Program

## 2020-12-31 ENCOUNTER — Other Ambulatory Visit: Payer: Self-pay

## 2020-12-31 VITALS — BP 140/80 | HR 93 | Ht 65.0 in | Wt 201.7 lb

## 2020-12-31 DIAGNOSIS — F332 Major depressive disorder, recurrent severe without psychotic features: Secondary | ICD-10-CM

## 2020-12-31 DIAGNOSIS — F331 Major depressive disorder, recurrent, moderate: Secondary | ICD-10-CM

## 2020-12-31 DIAGNOSIS — F411 Generalized anxiety disorder: Secondary | ICD-10-CM

## 2020-12-31 DIAGNOSIS — F4310 Post-traumatic stress disorder, unspecified: Secondary | ICD-10-CM

## 2020-12-31 MED ORDER — MIRTAZAPINE 15 MG TABLET
15.0000 mg | ORAL_TABLET | Freq: Every evening | ORAL | 5 refills | Status: DC
Start: 2020-12-31 — End: 2021-03-17

## 2020-12-31 MED ORDER — FLUOXETINE 20 MG TABLET
ORAL_TABLET | ORAL | 5 refills | Status: DC
Start: 2020-12-31 — End: 2021-03-17

## 2020-12-31 MED ORDER — BUPROPION HCL XL 300 MG 24 HR TABLET, EXTENDED RELEASE
300.0000 mg | ORAL_TABLET | Freq: Every day | ORAL | 2 refills | Status: DC
Start: 2020-12-31 — End: 2021-03-17
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
## 2021-01-02 ENCOUNTER — Encounter (INDEPENDENT_AMBULATORY_CARE_PROVIDER_SITE_OTHER): Payer: Self-pay | Admitting: Urology

## 2021-01-02 ENCOUNTER — Other Ambulatory Visit: Payer: Self-pay

## 2021-01-02 ENCOUNTER — Telehealth (INDEPENDENT_AMBULATORY_CARE_PROVIDER_SITE_OTHER): Payer: Self-pay | Admitting: Family

## 2021-01-02 ENCOUNTER — Ambulatory Visit: Payer: MEDICAID | Attending: Urology | Admitting: Urology

## 2021-01-02 VITALS — BP 118/70 | Temp 97.3°F | Ht 65.0 in | Wt 203.9 lb

## 2021-01-02 DIAGNOSIS — N3281 Overactive bladder: Secondary | ICD-10-CM

## 2021-01-02 DIAGNOSIS — R399 Unspecified symptoms and signs involving the genitourinary system: Secondary | ICD-10-CM

## 2021-01-02 DIAGNOSIS — R300 Dysuria: Secondary | ICD-10-CM | POA: Insufficient documentation

## 2021-01-02 DIAGNOSIS — Z87891 Personal history of nicotine dependence: Secondary | ICD-10-CM

## 2021-01-02 DIAGNOSIS — N3941 Urge incontinence: Secondary | ICD-10-CM

## 2021-01-02 NOTE — Progress Notes (Signed)
[facility]     PATIENT NAME: [name] NUMBER: [id number]   DATE OF SERVICE: 01/02/2021   DATE OF BIRTH: [birth year]    Chief Complaint:  Chief Complaint   Patient presents with   . Pain on Urination       HPI: [name] is a 65 y.o. female here today for bothersome urinary symptoms. She has been seen previously and underwent CMG in 2019 remarkable for small capacity (90 mL) overactive bladder with urge incontinence. She was on various anticholinergics for some time.  Currently taking Detrol 2 mg q.d.  She has had resolution of her urinary frequency and nocturia over the last year or so and has not been bothered by symptoms. This was the case up until mid January where she started experiencing dysuria and more urinary frequency.  Patient was evaluated by Dr. Matthew Folks on November 27, 2020, patient was started on an empiric course of Bactrim for 5 days due to leukocytes on urine dip and was instructed to follow up today.  Patient admits that her urinary symptoms improved, but once she finished a course of antibiotics her symptoms of dysuria resumed approximately 5 days thereafter.  Patient reports improvement of symptoms with over-the-counter azo. She denies any fevers or gross hematuria.  Patient also admits to urge incontinence, goes through approximately 2 pads per day and will leak urine if she does not make it to the bathroom on time, denies leaking urine with straining, coughing, laughing, sneezing.    I personally reviewed outside records and summarized in HPI    PMHx:   Past Medical History:   Diagnosis Date   . Anxiety    . Arthritis    . Cancer (CMS [facility])     internal skin cancer   . Chronic pain     lower back   . Depression    . Dyspnea on exertion    . Essential hypertension 02/16/2017    patient denies   . GERD (gastroesophageal reflux disease)     controlled w/med   . Headache    . Heart murmur 1979    benign   . Hyperlipidemia LDL goal <  100 08/29/2013    denies   . MINOR CAD (coronary artery disease) 11/10/2012    no treatment other than cholesterol medications; follows regularly with Dr. Rodney Booze   . Multinodular goiter    . Muscle weakness     neck    . Obesity    . Peripheral edema    . Rash     face only eczema   . Rheumatic fever 17    no complications   . S/P thyroidectomy    . Squamous cell carcinoma 02/06/2015    right side of neck   . 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 Vaginal prolapse 2012    surgery improved significantly   . Wears glasses        PSHx:   Past Surgical History:   Procedure Laterality Date   . COLONOSCOPY  01/06/09    COLONOSCOPY performed by Loletta Parish F at [place]   . GASTROSCOPY  03/05/2010    GASTROSCOPY performed by Jocelyn Lamer at [place]   . GASTROSCOPY WITH BIOPSY  03/05/2010    GASTROSCOPY WITH BIOPSY performed by Jocelyn Lamer at [place]   . HX ADENOIDECTOMY     . [name]  2007    left distal fibula, casted   . HX APPENDECTOMY     . HX CHOLECYSTECTOMY     . HX COLONOSCOPY     . HX CYSTOCELE REPAIR  09/24/09   . HX HAND SURGERY  2012    for Carpal tunnel : right side treated   . HX HEART CATHETERIZATION     . HX OOPHORECTOMY      left ovary removed   . HX PAROTIDECTOMY       "surgical clamp"    . HX PARTIAL THYROIDECTOMY Right 04/05/2019    R hemithyroidectomy for nodule, final path follicular adenoma   . HX TONSILLECTOMY     . HX TOTAL VAGINAL HYSTERECTOMY  1979   . HX WRIST FRACTURE [place]  2007    [place] injury   . PB REVISE ULNAR NERVE AT ELBOW Left 1979   . PB UPPER GI ENDOSCOPY,BIOPSY  12/19/07    patulous GE junction zone, erythema, nonerosive GERD       Family Hx: Marland Kitchen  Family Medical History:     Problem Relation (Age of Onset)    Bipolar Disorder Daughter, Paternal Uncle    Breast Cancer Paternal Aunt, Paternal 49, Paternal 19, Paternal 36, Paternal 49, Other    Cancer Paternal Aunt, Paternal 75, Paternal 74, Paternal 27, Paternal 37, Other (51)    Congestive  Heart Failure Father (60)    Coronary Artery Disease Father, Mother (63)    Diabetes Mother    Heart Attack Father, Sister    High Cholesterol Mother    Hypertension (High Blood Pressure) Mother, Brother, Sister, Sister    Kidney Disease Sister    Leukemia Paternal Uncle    Stroke Paternal Grandmother    Thyroid Disease Sister          Social Hx:   Social History     Socioeconomic History   . Marital status: Divorced     Spouse name: Not on file   . Number of children: 3   . Years of education: Not on file   . Highest education level: Not on file   Occupational History   . 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 Smoking status: Former Smoker     Packs/day: 2.00     Years: 40.00     Pack years: 80.00     Types: Cigarettes     Quit date: 01/22/2005     Years since quitting: 15.9   . Smokeless tobacco: Never Used   Vaping Use   . Vaping Use: Never used   Substance and Sexual Activity   . Alcohol use: No     Alcohol/week: 0.0 standard drinks   . Drug use: No   . Sexual activity: Yes     Partners: Male   Other Topics Concern   . Abuse/Domestic Violence Not Asked   . Breast Self Exam Not Asked   . Caffeine Concern Not Asked   . Calcium intake adequate Not Asked   . Computer Use Not Asked   . Exercise Concern Not Asked   . Helmet Use Not Asked   . Seat Belt Not Asked   . Special Diet Not Asked   . Sunscreen used Not Asked   . Uses Cane No   . Uses walker No   . Uses wheelchair No   . Right hand dominant Yes   . Left hand  dominant No   . Ambidextrous No   . Ability to Walk 1 Flight of Steps without SOB/CP Yes   . Routine Exercise Yes     Comment: walking 1 time weekly for 2 hours    . Ability to Walk 2 Flight of Steps without SOB/CP No     Comment: shortness of breath, no cp    . Unable to Ambulate No   . Total Care No   . Ability To Do Own ADL's Yes   . Uses Walker No   . Other Activity Level Yes     Comment: housework    . Uses Cane No   . Drives Not Asked    . Shift Work No   . Unusual Sleep-Wake Schedule No   Social History Narrative    Right handed.      Social Determinants of Health     Financial Resource Strain: Not on file   Food Insecurity: Not on file   Transportation Needs: Not on file   Physical Activity: Not on file   Stress: Not on file   Intimate Partner Violence: Not on file   Housing Stability: Not on file       Medications:   Current Outpatient Medications   Medication Sig   . aspirin (ECOTRIN) 81 mg Oral Tablet, Delayed Release (E.C.) Take 81 mg by mouth Once a day   . Azelaic Acid 20 % Cream Apply topically Twice daily (Patient taking differently: Apply topically Twice per day as needed)   . buPROPion (WELLBUTRIN XL) 300 mg extended release 24 hr tablet Take 1 Tablet (300 mg total) by mouth Once a day   . cetirizine (ZYRTEC) 10 mg Oral Tablet Take 1 Tablet (10 mg total) by mouth Once a day   . clobetasoL (TEMOVATE) 0.05 % Ointment APPLY TOPICALLY ROUTE TWICE DAILY FOR 90 DAYS   . erythromycin (ROMYCIN) 5 mg/gram (0.5 %) Ophthalmic Ointment Instill into right eye Twice daily   . FLUoxetine 20 mg Oral Tablet Take 3 tablets daily (60 mg total)   . fluticasone propionate (FLONASE) 50 mcg/actuation Nasal Spray, Suspension 1 Spray by Each Nostril route Once a day   . gabapentin (NEURONTIN) 600 mg Oral Tablet Take 1 Tablet (600 mg total) by mouth Twice daily   . Halobetasol Propionate (ULTRAVATE) 0.05 % Cream Apply topically Twice per day as needed Apply  topically. Three times a week   . ipratropium-albuterol 0.5 mg-3 mg(2.5 mg base)/3 mL Solution for Nebulization 3 mL by Nebulization route Four times a day   . Metronidazole (METROCREAM) 0.75 % Cream Apply topically Twice daily (Patient taking differently: Apply topically Twice per day as needed )   . Metronidazole (METROCREAM) 0.75 % Cream Apply topically   . mineral oil-hydrophil petrolat (AQUAPHOR) Ointment Apply topically Once per day as needed   . mirtazapine (REMERON) 15 mg Oral Tablet Take 1 Tablet  (15 mg total) by mouth Every night   . montelukast (SINGULAIR) 10 mg Oral Tablet Take 1 Tab (10 mg total) by mouth Once a day   . nitroGLYCERIN (NITROSTAT) 0.4 mg Sublingual Tablet, Sublingual 1 Tab (0.4 mg total) by Sublingual route Every 5 minutes as needed for Chest pain for up to 3 doses for 3 doses over 15 minutes   . nystatin (NYSTOP) 100,000 unit/gram Powder Apply once or twice daily to PREVENT rash under breasts   . pantoprazole (PROTONIX) 40 mg Oral Tablet, Delayed Release (E.C.) Take 1 Tablet (40 mg total) by mouth Once  a day   . tolterodine (DETROL) 2 mg Oral Tablet Take 2 mg by mouth Once a day   . triamcinolone acetonide (ARISTOCORT A) 0.1 % Ointment by Apply Topically route Twice daily (Patient taking differently: Apply topically Twice per day as needed)       Allergies:   Allergies   Allergen Reactions   . Cymbalta [Duloxetine]      Rash on feet and hands   . Blue Dye      Told to avoid due to patch test result by Simone Curia   . Flavoring Agent  Other Adverse Reaction (Add comment)     Patient unsure of reaction   . Lipitor [Atorvastatin] Myalgia     Muscle pain   . Nickel      Blisters  "Allergic to all kinds of metal"    . Codeine Itching     RASH   . Mobic [Meloxicam] Nausea/ Vomiting     Nervous and shakes    . Naprosyn [Naproxen] Nausea/ Vomiting   . Nsaids (Non-Steroidal Anti-Inflammatory Drug) Nausea/ Vomiting         ROS:  There were pertinent positives in:  GU.  All other systems are negative.      Physical Exam:  BP 118/70   Temp 36.3 C (97.3 F) (Thermal Scan)   Ht 1.651 m (5\' 5" )   Wt 92.5 kg (203 lb 14.8 oz)   BMI 33.93 kg/m     General - alert/oriented; NAD  Eyes - conjunctiva clear   HENT - NCAT   Lungs - Unlabored breathing   GI - soft, NT/ND. no CVAT.  Musculoskeletal - FROM in all 4 extremities  Neurological - CN II-XII Grossly intact  GU system - no SP fullness or tenderness   Skin - warm and dry   Psych - Normal affect      LABS:     URINE CULTURE  Order: [id number]   Status:  Final result    Visible to patient: Yes (seen)    Next appt: 01/07/2021 at 04:30 PM in Radiology ([place])    Dx: Dysuria; Urgency of urination; Overac...   Specimen Information: Urine, Clean Catch        0 Result Notes    URINE CULTURE No Growth 18-24 hrs.                  Specimen Collected: 11/27/20 09:41 Last Resulted: 11/28/20 15:03             Results for GAEL, DELUDE (MRN [id number]) as of 01/02/2021 15:29   Ref. Range 11/27/2020 09:41   APPEARANCE Latest Ref Range: Clear  Clear   COLOR Latest Ref Range: Normal (Yellow)  Normal (Yellow)   SPECIFIC GRAVITY, URINE Latest Ref Range: 1.005 - 1.030  1.025   GLUCOSE Latest Ref Range: Negative, 30  mg/dL Negative   BILIRUBIN Latest Ref Range: Negative mg/dL Negative   KETONES Latest Ref Range: Negative mg/dL Negative   BLOOD Latest Ref Range: Negative mg/dL Negative   PH URINE Latest Ref Range: 5.0 - 8.0  6.0   PROTEIN Latest Ref Range: Negative, 10  mg/dL 30 (A)   UROBILINOGEN Latest Ref Range: Negative mg/dL Negative   NITRITE Latest Ref Range: Negative  Negative   LEUKOCYTES Latest Ref Range: Negative WBCs/uL Large (A)   RBC'S Latest Ref Range: <6.0 /hpf 0.0   WBC'S Latest Ref Range: <11.0 /hpf 26.0 (H)   BACTERIA Latest Ref Range: Occasional or  less /hpf Occasional or less   MUCOUS Latest Ref Range: Light /lpf Light   RENAL EPITHELIAL Latest Ref Range: Occasional or less /lpf Few (A)   SQUAMOUS EPITHELIAL CELLS Latest Ref Range: Occasional or less /lpf Occasional or less       Urine Dip Results:  Urine Dip Results:   Time collected: 1240  Glucose (Ref Range: Negative mg/dL): Negative  Bilirubin (Ref Range: Negative mg/dL): Negative  Ketones (Ref Range: Negative mg/dL): Negative  Urine Specific Gravity (Ref Range: 1.005 - 1.030): 1.020  Blood (urine) (Ref Range: Negative mg/dL): Negative  pH (Ref Range: 5.0 - 8.0): 6.5  Protein (Ref Range: Negative mg/dL): Negative  Urobilinogen (Ref Range: Negative mg/dL): Normal   Nitrite (Ref Range:  Negative): Negative  Leukocytes (Ref Range: Negative WBC's/uL): Negative    Assessment:  65 y.o. female with     1. Dysuria without current infectious etiology  2. Bothersome LUTS  3. Overactive bladder  4. Small capacity bladder (87mL)  5. Urge incontinence    Plan:  -discussed with the patient that she does does not appear to have an infectious etiology of her symptoms, most recent urine culture reviewed and is negative, urine dip today is entirely unremarkable  -current symptoms of urinary urgency and dysuria may be due to interstitial cystitis versus infection versus urethritis due to frequent voiding secondary to small capacity bladder  -I attempted to prescribe Pyridium 200 mg however patient has an allergy to blue dye which causes blisters of her palms and soles.  I recommended that patient continue with over-the-counter azo until further evaluation and recommendations by Dr. Vena Rua.  -patient encouraged to follow-up with Dr. Vena Rua for evaluation for potential interstitial cystitis as clinical symptoms of urinary urgency and dysuria do not currently correlate with infectious etiology    Eugena Rhue  was offered the opportunity to voice questions or concerns regarding today's visit, the plan of care set forth to them by provider, and actions that have been or will be taken for them on their behalf. [name] did not have any questions or concerns at the end of the visit today, and furthermore, Prosperity Darrough was satisfied with the above mentioned plan of care and follow up. Viveka Wilmeth is agreeable to the above plan of care and will follow up with urology or his PCP sooner should their condition worsen or the need arise.     Roselyn Reef, PA-C  01/02/2021, 13:29

## 2021-01-02 NOTE — Telephone Encounter (Addendum)
Peer to peer completed today and approved [id number] for CT Cervical spine.    Luisa Dago, APRN,NP-C  01/02/2021, 15:45        ----- Message from Laure Kidney sent at 12/29/2020  8:20 AM EST -----  Regarding: Peer to peer  The insurance is not wanting to approve the CT Cervical Spine ordered on 3/1 due to:    Needing to know why a scan is needed so close to a similar study (MRI Cervical Spine done on 2/28)    You may do a peer to peer by calling [phone]    Thank you,  [facility]

## 2021-01-07 ENCOUNTER — Ambulatory Visit
Admission: RE | Admit: 2021-01-07 | Discharge: 2021-01-07 | Disposition: A | Discharge status: Home / Self Care | Payer: MEDICAID | Source: Ambulatory Visit | Attending: Family | Admitting: Family

## 2021-01-07 ENCOUNTER — Other Ambulatory Visit: Payer: Self-pay

## 2021-01-07 DIAGNOSIS — M50323 Other cervical disc degeneration at C6-C7 level: Secondary | ICD-10-CM

## 2021-01-07 DIAGNOSIS — M50321 Other cervical disc degeneration at C4-C5 level: Secondary | ICD-10-CM

## 2021-01-07 DIAGNOSIS — M4802 Spinal stenosis, cervical region: Secondary | ICD-10-CM

## 2021-01-07 DIAGNOSIS — M542 Cervicalgia: Secondary | ICD-10-CM | POA: Insufficient documentation

## 2021-01-07 DIAGNOSIS — M47812 Spondylosis without myelopathy or radiculopathy, cervical region: Secondary | ICD-10-CM | POA: Insufficient documentation

## 2021-01-07 DIAGNOSIS — M50322 Other cervical disc degeneration at C5-C6 level: Secondary | ICD-10-CM

## 2021-01-07 DIAGNOSIS — M5412 Radiculopathy, cervical region: Secondary | ICD-10-CM | POA: Insufficient documentation

## 2021-01-19 ENCOUNTER — Encounter (INDEPENDENT_AMBULATORY_CARE_PROVIDER_SITE_OTHER): Payer: Self-pay | Admitting: Urology

## 2021-01-19 ENCOUNTER — Ambulatory Visit: Payer: MEDICAID | Attending: Urology | Admitting: Urology

## 2021-01-19 ENCOUNTER — Other Ambulatory Visit: Payer: Self-pay

## 2021-01-19 VITALS — BP 116/78 | HR 80 | Temp 97.5°F | Ht 65.0 in | Wt 204.1 lb

## 2021-01-19 DIAGNOSIS — R300 Dysuria: Secondary | ICD-10-CM | POA: Insufficient documentation

## 2021-01-19 DIAGNOSIS — Z6833 Body mass index (BMI) 33.0-33.9, adult: Secondary | ICD-10-CM

## 2021-01-19 MED ORDER — CIPROFLOXACIN 500 MG TABLET
500.0000 mg | ORAL_TABLET | Freq: Two times a day (BID) | ORAL | 4 refills | Status: DC
Start: 2021-01-19 — End: 2021-07-21

## 2021-01-19 NOTE — Progress Notes (Signed)
S:  Returns for follow up, history of small capacity bladder -- UroD 7.2019 -- 90 cc.    Tried anticholinergics, little improvement.  Biggest symptom is dysuria.    O:  Examination:  Well-developed, well-nourished woman  in no apparent distress.  Abdomen is free of masses, tenderness or hepatosplenomegaly  CVA tenderness: none .  No suprapubic fullness.    A:  Dysuria    P:  Trial of Cipro 400 mg BID -- 3-5 day courses when symptomatic.    The patient was told of three important components they can undertake to improve their voiding:  They are:  (1)  Scheduled voiding, (2) Limiting liquids prior to bedtime, and (3) Avoiding bladder irritants.  I discussed each component with the patient in detail as written below:     First, [name] was instructed to assist their voiding by voiding on a schedule of approximately every 2-3 hours even if they do not have to void.  In addition, they are instructed to double-void.  This means they should void in the bathroom and then attempt to void again a few minutes later, this may allow for elimination of some more left over urine.  This maneuver can improve bladder function.    Second,  [name] was instructed to limit liquids approximately 2 hours prior to bedtime.  This is because the increased fluid intake can lead to increased urine output overnight.  I advised the patient that is normal to void up to 2 times per night.      Finally, [name] was told about reduction of bladder irritants.  This may include maneuvers such as limiting the the intake of acid foods, spicy foods and caffeinated beverages.  In some patients, these foods/beverages can cause bladder irritation and symptoms of urgency, frequency, pelvic and urethral pain.  If such foods cause problems, they should be avoided.    Return in 4-6 months.    Woodfin Ganja, MD, MBA, FACS  Professor and [facility]  Department of Urology    Credentialed Pain Management Practitioner  [employer]  [employer]

## 2021-01-21 ENCOUNTER — Encounter (HOSPITAL_BASED_OUTPATIENT_CLINIC_OR_DEPARTMENT_OTHER): Payer: Self-pay | Admitting: Physician Assistant

## 2021-01-21 ENCOUNTER — Other Ambulatory Visit (HOSPITAL_BASED_OUTPATIENT_CLINIC_OR_DEPARTMENT_OTHER): Payer: Self-pay | Admitting: Physician Assistant

## 2021-01-21 ENCOUNTER — Other Ambulatory Visit: Payer: Self-pay

## 2021-01-21 ENCOUNTER — Ambulatory Visit: Payer: MEDICAID | Attending: Physician Assistant | Admitting: Physician Assistant

## 2021-01-21 ENCOUNTER — Ambulatory Visit (HOSPITAL_BASED_OUTPATIENT_CLINIC_OR_DEPARTMENT_OTHER): Payer: Self-pay | Admitting: Family Medicine

## 2021-01-21 VITALS — BP 126/80 | HR 97 | Temp 98.1°F | Resp 20 | Ht 65.0 in | Wt 203.3 lb

## 2021-01-21 DIAGNOSIS — G518 Other disorders of facial nerve: Secondary | ICD-10-CM

## 2021-01-21 DIAGNOSIS — M5416 Radiculopathy, lumbar region: Secondary | ICD-10-CM

## 2021-01-21 DIAGNOSIS — Z6833 Body mass index (BMI) 33.0-33.9, adult: Secondary | ICD-10-CM

## 2021-01-21 DIAGNOSIS — M545 Low back pain, unspecified: Secondary | ICD-10-CM

## 2021-01-21 DIAGNOSIS — E041 Nontoxic single thyroid nodule: Secondary | ICD-10-CM

## 2021-01-21 MED ORDER — PREDNISONE 50 MG TABLET
50.0000 mg | ORAL_TABLET | Freq: Every day | ORAL | 0 refills | Status: DC
Start: 2021-01-21 — End: 2021-01-27

## 2021-01-21 MED ORDER — KETOROLAC 60 MG/2 ML INTRAMUSCULAR SOLUTION
60.0000 mg | INTRAMUSCULAR | 0 refills | Status: DC
Start: 2021-01-21 — End: 2021-01-27

## 2021-01-21 MED ORDER — METHOCARBAMOL 500 MG TABLET
500.0000 mg | ORAL_TABLET | Freq: Four times a day (QID) | ORAL | 0 refills | Status: DC
Start: 2021-01-21 — End: 2021-10-28

## 2021-01-21 NOTE — Progress Notes (Signed)
Department of Family Medicine   Progress Note    [name]  MRN: [id number]  DOB: Feb 12, 1956  Date of Service: 01/21/2021    CHIEF COMPLAINT  Chief Complaint   Patient presents with   . Leg Pain     Started after playing a game at the community center Sunday   . Back Pain       SUBJECTIVE  [name] is a 65 y.o. female who presents to clinic for acutely worsening back pain.    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 Patient explains that she participated in a church even this weekend where she was doing a lot of moving around. Currently endorses shooting pains starting in her lower back and radiating down her left leg. She has been taking daily gabapentin as prescribed as well as alternating Tylenol and ibuprofen 800 mg with little relief. Standing straight up and walking exacerbate symptoms. Denies worsening numbness and tingling. No loss of bladder or bowel control.         Review of Systems:  Positive ROS discussed in HPI, otherwise all other systems negative.      Medications:   aspirin (ECOTRIN) 81 mg Oral Tablet, Delayed Release (E.C.), Take 81 mg by mouth Once a day  Azelaic Acid 20 % Cream, Apply topically Twice daily (Patient taking differently: Apply topically Twice per day as needed)  buPROPion (WELLBUTRIN XL) 300 mg extended release 24 hr tablet, Take 1 Tablet (300 mg total) by mouth Once a day  cetirizine (ZYRTEC) 10 mg Oral Tablet, Take 1 Tablet (10 mg total) by mouth Once a day  ciprofloxacin HCl (CIPRO) 500 mg Oral Tablet, Take 1 Tablet (500 mg total) by mouth Twice daily  clobetasoL (TEMOVATE) 0.05 % Ointment, APPLY TOPICALLY ROUTE TWICE DAILY FOR 90 DAYS  erythromycin (ROMYCIN) 5 mg/gram (0.5 %) Ophthalmic Ointment, Instill into right eye Twice daily  FLUoxetine 20 mg Oral Tablet, Take 3 tablets daily (60 mg total)  fluticasone propionate  (FLONASE) 50 mcg/actuation Nasal Spray, Suspension, 1 Spray by Each Nostril route Once a day  gabapentin (NEURONTIN) 600 mg Oral Tablet, Take 1 Tablet (600 mg total) by mouth Twice daily  Halobetasol Propionate (ULTRAVATE) 0.05 % Cream, Apply topically Twice per day as needed Apply  topically. Three times a week  ipratropium-albuterol 0.5 mg-3 mg(2.5 mg base)/3 mL Solution for Nebulization, 3 mL by Nebulization route Four times a day  Metronidazole (METROCREAM) 0.75 % Cream, Apply topically Twice daily (Patient taking differently: Apply topically Twice per day as needed )  Metronidazole (METROCREAM) 0.75 % Cream, Apply topically  mineral oil-hydrophil petrolat (AQUAPHOR) Ointment, Apply topically Once per day as needed  mirtazapine (REMERON) 15 mg Oral Tablet, Take 1 Tablet (15 mg total) by mouth Every night  montelukast (SINGULAIR) 10 mg Oral Tablet, Take 1 Tab (10 mg total) by mouth Once a day  nitroGLYCERIN (NITROSTAT) 0.4 mg Sublingual Tablet, Sublingual, 1 Tab (0.4 mg total) by Sublingual route Every 5 minutes as needed for Chest pain for up to 3 doses for 3 doses over 15 minutes  nystatin (NYSTOP) 100,000 unit/gram Powder, Apply once or twice daily to PREVENT rash under breasts  pantoprazole (PROTONIX) 40 mg Oral Tablet, Delayed Release (E.C.), Take 1 Tablet (40 mg total) by mouth Once a day  tolterodine (DETROL) 2 mg Oral Tablet, Take 2 mg by mouth Once a day  triamcinolone acetonide (ARISTOCORT A)  0.1 % Ointment, by Apply Topically route Twice daily (Patient taking differently: Apply topically Twice per day as needed)    No facility-administered medications prior to visit.      Allergies:   Allergies   Allergen Reactions   . Cymbalta [Duloxetine]      Rash on feet and hands   . Blue Dye      Told to avoid due to patch test result by Simone Curia   . Flavoring Agent  Other Adverse Reaction (Add comment)     Patient unsure of reaction   . Lipitor [Atorvastatin] Myalgia     Muscle pain   . Nickel       Blisters  "Allergic to all kinds of metal"    . Codeine Itching     RASH   . Mobic [Meloxicam] Nausea/ Vomiting     Nervous and shakes    . Naprosyn [Naproxen] Nausea/ Vomiting   . Nsaids (Non-Steroidal Anti-Inflammatory Drug) Nausea/ Vomiting         OBJECTIVE  BP 126/80   Pulse 97   Temp 36.7 C (98.1 F) (Thermal Scan)   Resp 20   Ht 1.651 m (5\' 5" )   Wt 92.2 kg (203 lb 4.2 oz)   SpO2 97%   BMI 33.82 kg/m       General: moderate distress   HENT: normocephalic, atraumatic, EOMs intact, nares patent, trachea midline, neck supple   Lungs: clear to auscultation bilaterally  Cardiovascular: RRR, no murmur  Extremities: no cyanosis or edema   Exam of ROM and strength poorly tolerated due to pain. Hip abduction and flexion 5/5. PMS intact bilaterally   Skin: warm and dry, no rash  Neurologic: gait is normal, AOx3  Psychiatric: normal affect and behavior    ASSESSMENT/PLAN  (M54.50) Lower back pain  (primary encounter diagnosis)  (M54.16) Lumbar radiculopathy  Plan:  Chronic with acute exacerbation   methocarbamoL (ROBAXIN) 500 mg Oral Tablet, ketorolac (TORADOL) 60 mg/2 mL IntraMUSCULAR Solution, predniSONE (DELTASONE) 50 mg Oral        Tablet for suspected muscle spasm and inflammation   Encouraged heat to assist with relaxation   Reviewed importance of gradually increasing mobility  F/u with neurology in approx 1 week         Return if symptoms worsen or fail to improve.      Alvis Lemmings, PA-C 01/21/2021, 19:05

## 2021-01-21 NOTE — Nursing Note (Signed)
01/21/21 1900   Medication Administration   Initials mhoffa   Medication  Toradol   Medication Dose Given 60mg /60ml   Route of Administration IM   Site Right Gluteus   NDC # [phone]   LOT # [id number]   Expiration date 09/23/22   Manufacturer fosun   Clinic Supplied Yes   Patient Supplied No

## 2021-01-27 ENCOUNTER — Encounter (INDEPENDENT_AMBULATORY_CARE_PROVIDER_SITE_OTHER): Payer: Self-pay | Admitting: Anesthesiology

## 2021-01-27 ENCOUNTER — Ambulatory Visit: Payer: MEDICAID | Attending: NURSE PRACTITIONER | Admitting: Anesthesiology

## 2021-01-27 ENCOUNTER — Ambulatory Visit (HOSPITAL_BASED_OUTPATIENT_CLINIC_OR_DEPARTMENT_OTHER)
Admission: RE | Admit: 2021-01-27 | Discharge: 2021-01-27 | Disposition: A | Discharge status: Home / Self Care | Payer: MEDICAID | Source: Ambulatory Visit

## 2021-01-27 ENCOUNTER — Other Ambulatory Visit: Payer: Self-pay

## 2021-01-27 ENCOUNTER — Ambulatory Visit (HOSPITAL_BASED_OUTPATIENT_CLINIC_OR_DEPARTMENT_OTHER): Payer: MEDICAID

## 2021-01-27 VITALS — BP 143/86 | HR 89 | Temp 96.9°F | Ht 65.0 in | Wt 203.0 lb

## 2021-01-27 DIAGNOSIS — Z6833 Body mass index (BMI) 33.0-33.9, adult: Secondary | ICD-10-CM

## 2021-01-27 DIAGNOSIS — G5601 Carpal tunnel syndrome, right upper limb: Secondary | ICD-10-CM | POA: Insufficient documentation

## 2021-01-27 DIAGNOSIS — M79604 Pain in right leg: Secondary | ICD-10-CM

## 2021-01-27 DIAGNOSIS — M542 Cervicalgia: Secondary | ICD-10-CM | POA: Insufficient documentation

## 2021-01-27 DIAGNOSIS — M4722 Other spondylosis with radiculopathy, cervical region: Secondary | ICD-10-CM | POA: Insufficient documentation

## 2021-01-27 DIAGNOSIS — M4696 Unspecified inflammatory spondylopathy, lumbar region: Secondary | ICD-10-CM

## 2021-01-27 DIAGNOSIS — R292 Abnormal reflex: Secondary | ICD-10-CM | POA: Insufficient documentation

## 2021-01-27 DIAGNOSIS — M79643 Pain in unspecified hand: Secondary | ICD-10-CM | POA: Insufficient documentation

## 2021-01-27 DIAGNOSIS — M79641 Pain in right hand: Secondary | ICD-10-CM

## 2021-01-27 DIAGNOSIS — G5641 Causalgia of right upper limb: Secondary | ICD-10-CM | POA: Insufficient documentation

## 2021-01-27 DIAGNOSIS — M19041 Primary osteoarthritis, right hand: Secondary | ICD-10-CM

## 2021-01-27 DIAGNOSIS — M5090 Cervical disc disorder, unspecified, unspecified cervical region: Secondary | ICD-10-CM | POA: Insufficient documentation

## 2021-01-27 DIAGNOSIS — M545 Low back pain, unspecified: Secondary | ICD-10-CM | POA: Insufficient documentation

## 2021-01-27 DIAGNOSIS — M47812 Spondylosis without myelopathy or radiculopathy, cervical region: Secondary | ICD-10-CM | POA: Insufficient documentation

## 2021-01-27 DIAGNOSIS — M5412 Radiculopathy, cervical region: Secondary | ICD-10-CM | POA: Insufficient documentation

## 2021-01-27 DIAGNOSIS — M25741 Osteophyte, right hand: Secondary | ICD-10-CM

## 2021-01-27 NOTE — Progress Notes (Signed)
I personally saw and evaluated the patient. See mid-level's note for additional details. My findings/participation are : has not had the cervical SNRB (no one called). She has also developed a right L5 radiculopathy, with pain shooting down to the right big toe. She remains hyperreflexive without being myelopathic. The right grip is significantly weaker as compared to the left. She had had CTR on the right, about 10 years ago. 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 I will get a right C6 SNRB and for her right leg radiculopathy a MRI lumbar. I will also refer her to the hand surgeons for evaluation of CTR and will see her back in 4 weeks. Plain lumbar films today.  On the day of the encounter, a total of  16 minutes was spent on this patient encounter including review of historical information, examination, documentation and post-visit activities.       Debbora Dus, MD
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
## 2021-02-03 ENCOUNTER — Other Ambulatory Visit (HOSPITAL_BASED_OUTPATIENT_CLINIC_OR_DEPARTMENT_OTHER): Payer: Self-pay | Admitting: Physician Assistant

## 2021-02-03 DIAGNOSIS — E041 Nontoxic single thyroid nodule: Secondary | ICD-10-CM

## 2021-02-03 DIAGNOSIS — G518 Other disorders of facial nerve: Secondary | ICD-10-CM

## 2021-02-05 ENCOUNTER — Encounter (HOSPITAL_BASED_OUTPATIENT_CLINIC_OR_DEPARTMENT_OTHER): Payer: Self-pay | Admitting: Physician Assistant

## 2021-02-05 MED ORDER — GABAPENTIN 600 MG TABLET
600.0000 mg | ORAL_TABLET | Freq: Two times a day (BID) | ORAL | 0 refills | Status: DC
Start: 2021-02-05 — End: 2021-03-30

## 2021-02-07 ENCOUNTER — Encounter (HOSPITAL_BASED_OUTPATIENT_CLINIC_OR_DEPARTMENT_OTHER): Payer: Self-pay | Admitting: Physician Assistant

## 2021-02-10 ENCOUNTER — Encounter (INDEPENDENT_AMBULATORY_CARE_PROVIDER_SITE_OTHER): Payer: Self-pay | Admitting: NURSE PRACTITIONER

## 2021-02-10 ENCOUNTER — Encounter (HOSPITAL_COMMUNITY): Payer: MEDICAID | Admitting: Student in an Organized Health Care Education/Training Program

## 2021-02-13 ENCOUNTER — Encounter (HOSPITAL_COMMUNITY): Payer: MEDICAID | Admitting: Student in an Organized Health Care Education/Training Program

## 2021-02-17 ENCOUNTER — Encounter (HOSPITAL_COMMUNITY): Payer: Self-pay | Admitting: Student in an Organized Health Care Education/Training Program

## 2021-02-18 ENCOUNTER — Ambulatory Visit (HOSPITAL_COMMUNITY): Payer: Self-pay | Admitting: Student in an Organized Health Care Education/Training Program

## 2021-02-18 NOTE — Telephone Encounter (Addendum)
Covering for Dr. Marvel Plan. Called the pharmacy. Medicaid is stating that the prior auth needs renewed. Sent message to case management.     Nelva Nay, MD 02/18/2021 11:48  PGY3, Dept of [facility]          ----- Message from Anette Riedel sent at 02/18/2021 10:41 AM EDT -----  Pharmacy is calling in checking on the statues of prior auth. Please advise. Thank you.    Current Outpatient Medications:    FLUoxetine 20 mg Oral Tablet, Take 3 tablets daily (60 mg total)          Preferred Pharmacy     [id number] - [place], [place] [address]    Phone: [phone] Fax: [phone]    Hours: Not open 24 hours        Thanks!  Andee Poles

## 2021-02-23 ENCOUNTER — Other Ambulatory Visit: Payer: Self-pay

## 2021-02-23 ENCOUNTER — Ambulatory Visit
Admission: RE | Admit: 2021-02-23 | Discharge: 2021-02-23 | Disposition: A | Discharge status: Home / Self Care | Payer: MEDICAID | Source: Ambulatory Visit | Attending: NURSE PRACTITIONER | Admitting: NURSE PRACTITIONER

## 2021-02-23 DIAGNOSIS — M79604 Pain in right leg: Secondary | ICD-10-CM

## 2021-02-23 DIAGNOSIS — M545 Low back pain, unspecified: Secondary | ICD-10-CM

## 2021-02-23 DIAGNOSIS — M47816 Spondylosis without myelopathy or radiculopathy, lumbar region: Secondary | ICD-10-CM

## 2021-02-23 DIAGNOSIS — R292 Abnormal reflex: Secondary | ICD-10-CM

## 2021-02-24 ENCOUNTER — Encounter (INDEPENDENT_AMBULATORY_CARE_PROVIDER_SITE_OTHER): Payer: Self-pay | Admitting: Anesthesiology

## 2021-02-24 ENCOUNTER — Ambulatory Visit: Payer: MEDICAID | Attending: Nurse Practitioner | Admitting: Anesthesiology

## 2021-02-24 VITALS — BP 142/80 | HR 91 | Temp 97.2°F | Ht 65.0 in | Wt 203.0 lb

## 2021-02-24 DIAGNOSIS — M25551 Pain in right hip: Secondary | ICD-10-CM | POA: Insufficient documentation

## 2021-02-24 DIAGNOSIS — M79604 Pain in right leg: Secondary | ICD-10-CM | POA: Insufficient documentation

## 2021-02-24 DIAGNOSIS — M5090 Cervical disc disorder, unspecified, unspecified cervical region: Secondary | ICD-10-CM | POA: Insufficient documentation

## 2021-02-24 DIAGNOSIS — M79643 Pain in unspecified hand: Secondary | ICD-10-CM | POA: Insufficient documentation

## 2021-02-24 DIAGNOSIS — Z6833 Body mass index (BMI) 33.0-33.9, adult: Secondary | ICD-10-CM

## 2021-02-24 DIAGNOSIS — M545 Low back pain, unspecified: Secondary | ICD-10-CM | POA: Insufficient documentation

## 2021-02-24 MED ORDER — MELOXICAM 7.5 MG TABLET
7.5000 mg | ORAL_TABLET | Freq: Every day | ORAL | 0 refills | Status: AC
Start: 2021-02-24 — End: 2021-03-17

## 2021-02-24 NOTE — Progress Notes (Unsigned)
[facility]  Return Outpatient Note    Date: 02/24/21  Age:  65 y.o.  Referring Physician:   No referring provider defined for this encounter.         Subjective:   Chief Complaint:   Chief Complaint   Patient presents with   . Follow Up       [name] is a 65 y.o. right handed female who presents for follow up with MRI lumbosacral to review.   She endorses years of chronic cervical and lumbar pain. 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 She was last seen in clinic on 01/27/21 where she endorsed burning neck pain, right hand pain, and lumbar pain in L5 distribution. MRI and CT showed severe right C6 foraminal stenosis. She was encouraged to undergo right C6 SNRB and obtain Lumbar MRI for further evaluation.     Today the patient is here for follow up with lumbar imaging to review. She did not have cervical injection performed and states that her insurance denied it. Her main complaint today is 9/10 right hip pain that radiates down the right leg into all toes of the right foot. Pain will radiate into the right groin and right hip is tender to palpation. Pain is aggravated by walking and is slightly improved with rest. She continues to have left scapular pain and numbness/burning in right wrist. She has noticed right grip weakness. Denies falls or bowel/bladder dysfunction.     Conservative: PT was stopped by the therapist after 3 sessions due to the shooting spine pain. She is on Neurontin (helps with the "choking feeling" that she has had since undergoing a thyroidectomy). No cervical spine injection therapy.Hx lumbar spine injections.Heat and ice help some.      Current Outpatient Medications:   .  aspirin (ECOTRIN) 81 mg Oral Tablet, Delayed Release (E.C.), Take 81 mg by mouth Once a day, Disp: , Rfl:   .  Azelaic Acid 20 % Cream, Apply topically Twice daily (Patient taking differently: Apply topically Twice  per day as needed), Disp: 50 g, Rfl: 0  .  buPROPion (WELLBUTRIN XL) 300 mg extended release 24 hr tablet, Take 1 Tablet (300 mg total) by mouth Once a day, Disp: 90 Tablet, Rfl: 2  .  cetirizine (ZYRTEC) 10 mg Oral Tablet, Take 1 Tablet (10 mg total) by mouth Once a day, Disp: 30 Tablet, Rfl: 5  .  ciprofloxacin HCl (CIPRO) 500 mg Oral Tablet, Take 1 Tablet (500 mg total) by mouth Twice daily, Disp: 30 Tablet, Rfl: 4  .  clobetasoL (TEMOVATE) 0.05 % Ointment, APPLY TOPICALLY ROUTE TWICE DAILY FOR 90 DAYS, Disp: 60 g, Rfl: 3  .  erythromycin (ROMYCIN) 5 mg/gram (0.5 %) Ophthalmic Ointment, Instill into right eye Twice daily, Disp: 1 g, Rfl: 0  .  FLUoxetine 20 mg Oral Tablet, Take 3 tablets daily (60 mg total), Disp: 90 Tablet, Rfl: 5  .  fluticasone propionate (FLONASE) 50 mcg/actuation Nasal Spray, Suspension, 1 Spray by Each Nostril route Once a day, Disp: 48 mL, Rfl: 3  .  gabapentin (NEURONTIN) 600 mg Oral Tablet, Take 1 Tablet (600 mg total) by mouth Twice daily, Disp: 60 Tablet, Rfl: 0  .  Halobetasol Propionate (ULTRAVATE) 0.05 % Cream, Apply topically Twice per day as needed Apply  topically. Three times a week, Disp: 50 g, Rfl: 1  .  ipratropium-albuterol 0.5 mg-3 mg(2.5 mg base)/3 mL  Solution for Nebulization, 3 mL by Nebulization route Four times a day, Disp: 360 mL, Rfl: 5  .  meloxicam (MOBIC) 7.5 mg Oral Tablet, Take 1 Tablet (7.5 mg total) by mouth Once a day for 21 days, Disp: 21 Tablet, Rfl: 0  .  methocarbamoL (ROBAXIN) 500 mg Oral Tablet, Take 1 Tablet (500 mg total) by mouth Four times a day, Disp: 30 Tablet, Rfl: 0  .  Metronidazole (METROCREAM) 0.75 % Cream, Apply topically, Disp: , Rfl:   .  mineral oil-hydrophil petrolat (AQUAPHOR) Ointment, Apply topically Once per day as needed, Disp: , Rfl:   .  mirtazapine (REMERON) 15 mg Oral Tablet, Take 1 Tablet (15 mg total) by mouth Every night, Disp: 30 Tablet, Rfl: 5  .  montelukast (SINGULAIR) 10 mg Oral Tablet, Take 1 Tab (10 mg total) by mouth  Once a day, Disp: 90 Tab, Rfl: 3  .  nitroGLYCERIN (NITROSTAT) 0.4 mg Sublingual Tablet, Sublingual, 1 Tab (0.4 mg total) by Sublingual route Every 5 minutes as needed for Chest pain for up to 3 doses for 3 doses over 15 minutes, Disp: 20 Tab, Rfl: 0  .  nystatin (NYSTOP) 100,000 unit/gram Powder, Apply once or twice daily to PREVENT rash under breasts, Disp: 60 g, Rfl: 11  .  pantoprazole (PROTONIX) 40 mg Oral Tablet, Delayed Release (E.C.), Take 1 Tablet (40 mg total) by mouth Once a day, Disp: 90 Tablet, Rfl: 3  .  tolterodine (DETROL) 2 mg Oral Tablet, Take 2 mg by mouth Once a day, Disp: , Rfl:   .  triamcinolone acetonide (ARISTOCORT A) 0.1 % Ointment, by Apply Topically route Twice daily (Patient taking differently: Apply topically Twice per day as needed), Disp: 80 g, Rfl: 2     Objective:   Vital Signs:  BP (!) 142/80   Pulse 91   Temp 36.2 C (97.2 F)   Ht 1.651 m (5\' 5" )   Wt 92.1 kg (203 lb 0.7 oz)   BMI 33.79 kg/m       Examination  Constitutional: Well groomed, in no apparent distress  Skin:  Intact   Head:  NC/AT   Eyes: PERRL, EOMI, conjunctiva clear  ENT:  Tongue midline, trachea midline  Cardiovascular:    Peripheral vascular system: Pulses palpable, no peripheral edema   Respiratory: Unlabored  Musculoskeletal  Gait and Station: slow, antalgic gait.   Muscle strength (upper extremities): 5/5 bilaterally except for 4/5 right grip   Muscle strength (lower extremities): 4+/5 bilaterally (pain limited)  Muscle tone (upper extremities): WNL  Muscle tone (lower extremities): WNL  Sensory: Sensory exam in the upper and lower extremities is normal  DTR's upper and lower extremities: 3+ bilaterally  Hoffman's reflex: Not present bilaterally  Ankle clonus: Negative bilaterally  Babinski: Downgoing bilaterally  Straight leg raise: Positive right leg   Musculoskeletal tenderness: Positive in center of low back, right lateral hip, and left scapula area   Neurological  Level of consciousness: Alert and  oriented  Recent and remote memory: Good recall and able to follow commands  Attention span and concentration: Normal in conversation  Language/Speech: No aphasia or dysarthria  Fund of knowledge: Appropriate in this setting  Cranial nerves II to XII grossly intact.       Data reviewed  - Reviewed MRI Lumbosacral wo done 02/23/21 in [place] radiology report pending  - See Dr. Laroy Apple interpretation  - Reviewed XR Lumbar Spine with Flex/Ext done 01/27/21 [facility]   -  Impression: Vertebral body height and alignment is preserved. No abnormal intersegmental mobility on flexion or extension. There is facet arthropathy in the lower lumbar spine.   - Reviewed XR Right hand done 01/27/21 on [place] PACS   - Impression: There is no evidence of fracture or dislocation. Mild scattered PIP and DIP joint osteophytosis is noted throughout, somewhat greater in the second and third DIP joints. 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 Overall alignment is unremarkable. There is a tiny rounded calcification or small ossicle adjacent to the ulnar styloid unchanged. No other abnormal soft tissue calcifications are seen.  - Reviewed MRI Cervical wo performed on 12/22/20 on [place] PACS   - Impression: Degenerative spondylosis of the cervical spine, as above. There is up to mild spinal canal stenosis at C5-6 and C6-7. Moderate to severe neural foraminal stenosis on the right at C5-6  - Previous charts reviewed      Assessment:  [name] is a 65 y.o. right handed female who presents for follow up with MRI lumbosacral to review. Lumbar MRI does not show significant pathology and no lumbar surgery is indicated. Her presentation is concerning for hip bursitis. We will obtain right hip differential and prescribe trial of Mobic then see her back for evaluation. For her cervical spine Dr. Margart Sickles recommends she undergo injection that was previously ordered. We will try to get injection improved.     ICD-10-CM    1. Right hip pain   M25.551 CASE REQUEST IR LAB: IR ARTHROGRAM - HIP RIGHT   2. Hand pain  M79.643    3. Cervical neck pain with evidence of disc disease  M50.90    4. Lumbar pain with radiation down right leg  M54.50     M79.604        Recommendations  - Patient's history and films from today's visit were reviewed. Recommendations and plan were explained and discussed during the visit. The patient was given the opportunity to ask questions and they were addressed. Victorian Gunn is in agreement with the following plan:    - Order placed for right hip differential with Dr. Freddi Che  - E-scribed 3 week course of Mobic  - Will help to schedule previously ordered cervical injection   - RTC in 6 weeks, or sooner if problems develop.    - The patient has been advised to follow up with their PCP in regards any chronic medical conditions and any non-neurosurgical symptoms that they may have. A copy of the note from today's clinic appointment will be sent to the patient's PCP Alvis Lemmings, PA-C on file (confirmed during visit)    The patient was seen as a shared visit with the co-signing faculty.    Georga Hacking, APRN,NP-C 02/25/2021, 09:07    With Dr. Margart Sickles

## 2021-02-24 NOTE — Progress Notes (Unsigned)
I personally saw and evaluated the patient. See mid-level's note for additional details. My findings/participation are : Right hip and leg pain. MRI does not show any significant disease. Patrick's test is strongly positive and she had tenderness over the trochanter suggestive of bursitis. I will give her an anti-inflammatory (Mobic 7.5 mg daily x 3 weeks) and will get a right hip differential block with Dr Freddi Che. FU in 6 weeks.  Meanwhile for the C6 SNRB we will try to do a peer-review with the insurance as they thought that it was not medically necessary.    On the day of the encounter, a total of  17 minutes was spent on this patient encounter including review of historical information, examination, documentation and post-visit activities.       Debbora Dus, MD

## 2021-02-25 DIAGNOSIS — M79643 Pain in unspecified hand: Secondary | ICD-10-CM | POA: Insufficient documentation

## 2021-02-25 DIAGNOSIS — M545 Low back pain, unspecified: Secondary | ICD-10-CM | POA: Insufficient documentation

## 2021-02-25 DIAGNOSIS — M25551 Pain in right hip: Secondary | ICD-10-CM | POA: Insufficient documentation

## 2021-02-25 DIAGNOSIS — M5090 Cervical disc disorder, unspecified, unspecified cervical region: Secondary | ICD-10-CM | POA: Insufficient documentation

## 2021-03-05 ENCOUNTER — Other Ambulatory Visit (INDEPENDENT_AMBULATORY_CARE_PROVIDER_SITE_OTHER): Payer: Self-pay | Admitting: Nurse Practitioner

## 2021-03-05 DIAGNOSIS — M25551 Pain in right hip: Secondary | ICD-10-CM

## 2021-03-06 ENCOUNTER — Other Ambulatory Visit: Payer: Self-pay

## 2021-03-06 ENCOUNTER — Ambulatory Visit
Admission: RE | Admit: 2021-03-06 | Discharge: 2021-03-06 | Disposition: A | Discharge status: Home / Self Care | Payer: MEDICAID | Source: Ambulatory Visit | Attending: Nurse Practitioner | Admitting: Nurse Practitioner

## 2021-03-06 DIAGNOSIS — M25551 Pain in right hip: Secondary | ICD-10-CM

## 2021-03-06 DIAGNOSIS — G8929 Other chronic pain: Secondary | ICD-10-CM

## 2021-03-06 MED ORDER — ROPIVACAINE (PF) 5 MG/ML (0.5 %) INJECTION SOLUTION
4.0000 mL | Freq: Once | INTRAMUSCULAR | Status: AC
Start: 2021-03-06 — End: 2021-03-06
  Administered 2021-03-06 (×2): 4 mL via INTRA_ARTICULAR

## 2021-03-06 MED ORDER — TRIAMCINOLONE ACETONIDE 40 MG/ML SUSPENSION FOR INJECTION
40.0000 mg | Freq: Once | INTRAMUSCULAR | Status: AC
Start: 2021-03-06 — End: 2021-03-06
  Administered 2021-03-06: 40 mg via INTRA_ARTICULAR

## 2021-03-06 MED ORDER — IOVERSOL 320 MG IODINE/ML INTRAVENOUS SOLUTION
1.0000 mL | INTRAVENOUS | Status: AC
Start: 2021-03-06 — End: 2021-03-06
  Administered 2021-03-06: 1 mL via INTRAMUSCULAR

## 2021-03-06 MED ORDER — ROPIVACAINE (PF) 5 MG/ML (0.5 %) INJECTION SOLUTION
5.0000 mL | Freq: Once | INTRAMUSCULAR | Status: AC
Start: 2021-03-06 — End: 2021-03-06
  Administered 2021-03-06: 5 mL

## 2021-03-06 NOTE — Discharge Instructions (Signed)
Discharge Instructions following Hip Arthrogram    You may experience temporary numbness or weakness in your leg after the injection. When the numbing medication wears off, this should go away.  You should limit your activity and not drive for the remainder of the day.  You may experience a temporary increase in pain for the first 24-72 hours after the injection. This occurs because the local anesthetics were injected into an area where there is already inflammation. The anti-inflammatory effect from the steroid may take several days to take effect.   If the site of the injection is painful, you may apply an ice pack to the area to reduce the discomfort or take pain medication as directed by your physician.  You may resume all of your medications immediately following the injection, unless directed otherwise by your physician.   Please seek medical attention (PCP or local Emergency Dept.) if you experience fever, chills, redness or swelling at the injection site, increased pain, weakness or sensory changes or, rarely, changes in bladder and/or bowel function.    If it is indicated to call your physician, call [facility] at [phone], and ask the operator to page the Radiology Resident.  When calling after 4:00pm or on weekends, ask the operator to page the Radiology Resident On-Call.    If you have any further questions or concerns related to your post-procedure care, you may contact the Radiology Nurse Monday thru Friday, 7:00am-4:00pm at [phone].

## 2021-03-12 ENCOUNTER — Ambulatory Visit (INDEPENDENT_AMBULATORY_CARE_PROVIDER_SITE_OTHER): Payer: Self-pay | Admitting: Anesthesiology

## 2021-03-12 NOTE — Telephone Encounter (Addendum)
Routing to provider.  Shelda Pal, RN, CCRN 03/12/2021 12:19    Regarding: [name]   ----- Message from Elease Hashimoto sent at 03/12/2021 12:00 PM EDT -----  [name] - Patient is  Calling stating that she got her shot last Friday her hip is doing good but her toe is hurting really bad it is shooting pain all the way back in her foot please call     Thanks

## 2021-03-13 NOTE — Telephone Encounter (Signed)
Drue Flirt, [place], APRN,NP-C  You          Yes, I think that is appropriate.     - Arden     Message text      You  Georga Hacking, APRN,NP-C      [place]    PCP?      --------------------------------------------------------  Pt will need to reach out to PCP for new onset toe pain per service.  Shelda Pal, RN, CCRN 03/13/2021 11:44

## 2021-03-16 ENCOUNTER — Encounter (HOSPITAL_BASED_OUTPATIENT_CLINIC_OR_DEPARTMENT_OTHER): Payer: Self-pay | Admitting: Physician Assistant

## 2021-03-17 ENCOUNTER — Ambulatory Visit (INDEPENDENT_AMBULATORY_CARE_PROVIDER_SITE_OTHER): Payer: MEDICAID | Admitting: Student in an Organized Health Care Education/Training Program

## 2021-03-17 ENCOUNTER — Other Ambulatory Visit: Payer: Self-pay

## 2021-03-17 VITALS — BP 130/73 | HR 94 | Ht 65.0 in | Wt 201.7 lb

## 2021-03-17 DIAGNOSIS — F4310 Post-traumatic stress disorder, unspecified: Secondary | ICD-10-CM

## 2021-03-17 DIAGNOSIS — F411 Generalized anxiety disorder: Secondary | ICD-10-CM

## 2021-03-17 DIAGNOSIS — G8929 Other chronic pain: Secondary | ICD-10-CM

## 2021-03-17 DIAGNOSIS — F331 Major depressive disorder, recurrent, moderate: Secondary | ICD-10-CM

## 2021-03-17 DIAGNOSIS — F332 Major depressive disorder, recurrent severe without psychotic features: Secondary | ICD-10-CM

## 2021-03-17 MED ORDER — BUPROPION HCL XL 300 MG 24 HR TABLET, EXTENDED RELEASE
300.0000 mg | ORAL_TABLET | Freq: Every day | ORAL | 2 refills | Status: DC
Start: 2021-03-17 — End: 2021-07-21

## 2021-03-17 MED ORDER — MIRTAZAPINE 15 MG TABLET
15.0000 mg | ORAL_TABLET | Freq: Every evening | ORAL | 5 refills | Status: DC
Start: 2021-03-17 — End: 2021-07-21

## 2021-03-17 MED ORDER — FLUOXETINE 20 MG TABLET
ORAL_TABLET | ORAL | 5 refills | Status: DC
Start: 2021-03-17 — End: 2021-07-21
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
## 2021-03-27 ENCOUNTER — Other Ambulatory Visit (HOSPITAL_BASED_OUTPATIENT_CLINIC_OR_DEPARTMENT_OTHER): Payer: Self-pay | Admitting: Physician Assistant

## 2021-03-27 DIAGNOSIS — G518 Other disorders of facial nerve: Secondary | ICD-10-CM

## 2021-03-27 DIAGNOSIS — E041 Nontoxic single thyroid nodule: Secondary | ICD-10-CM

## 2021-03-30 ENCOUNTER — Other Ambulatory Visit (HOSPITAL_BASED_OUTPATIENT_CLINIC_OR_DEPARTMENT_OTHER): Payer: Self-pay | Admitting: Physician Assistant

## 2021-03-30 ENCOUNTER — Ambulatory Visit: Payer: MEDICAID | Attending: Family Medicine | Admitting: Family Medicine

## 2021-03-30 ENCOUNTER — Encounter (HOSPITAL_BASED_OUTPATIENT_CLINIC_OR_DEPARTMENT_OTHER): Payer: Self-pay | Admitting: Family Medicine

## 2021-03-30 ENCOUNTER — Other Ambulatory Visit: Payer: Self-pay

## 2021-03-30 VITALS — BP 124/82 | HR 99 | Temp 97.8°F | Resp 20 | Ht 65.0 in | Wt 201.1 lb

## 2021-03-30 DIAGNOSIS — E041 Nontoxic single thyroid nodule: Secondary | ICD-10-CM

## 2021-03-30 DIAGNOSIS — M79671 Pain in right foot: Secondary | ICD-10-CM | POA: Insufficient documentation

## 2021-03-30 DIAGNOSIS — M130 Polyarthritis, unspecified: Secondary | ICD-10-CM | POA: Insufficient documentation

## 2021-03-30 DIAGNOSIS — G518 Other disorders of facial nerve: Secondary | ICD-10-CM | POA: Insufficient documentation

## 2021-03-30 DIAGNOSIS — M79641 Pain in right hand: Secondary | ICD-10-CM | POA: Insufficient documentation

## 2021-03-30 DIAGNOSIS — M79672 Pain in left foot: Secondary | ICD-10-CM | POA: Insufficient documentation

## 2021-03-30 DIAGNOSIS — G5601 Carpal tunnel syndrome, right upper limb: Secondary | ICD-10-CM | POA: Insufficient documentation

## 2021-03-30 DIAGNOSIS — Z6833 Body mass index (BMI) 33.0-33.9, adult: Secondary | ICD-10-CM

## 2021-03-30 DIAGNOSIS — M79606 Pain in leg, unspecified: Secondary | ICD-10-CM | POA: Insufficient documentation

## 2021-03-30 MED ORDER — GABAPENTIN 600 MG TABLET
600.0000 mg | ORAL_TABLET | Freq: Two times a day (BID) | ORAL | 0 refills | Status: DC
Start: 2021-03-30 — End: 2021-05-18

## 2021-03-30 MED ORDER — DICLOFENAC 1 % TOPICAL GEL
Freq: Four times a day (QID) | CUTANEOUS | 2 refills | Status: DC
Start: 2021-03-30 — End: 2021-08-04

## 2021-03-30 NOTE — Progress Notes (Signed)
[facility]   Department of Family Medicine   Acute Visit  Note    [name]  MRN: [id number]  DOB: 1956-08-13  Date of Service: 03/30/2021  PCP: Alvis Lemmings, PA-C    CHIEF COMPLAINT  Chief Complaint   Patient presents with   . Medication Check   . Chronic Pain     Neck, shoulder, knee   . Foot Pain     Right side - at toes and radiates up leg     SUBJECTIVE  [name] is a 65 y.o. female who presents to clinic for med refill.     She needs a refill of Gabapentin. PCP has been out with surgery to have refilled    Neurosurgery placed her on Mobic (on allergy list since 2010 - nausea and vomiting)  - This caused her nausea and vomiting as well as facial blistering  - She reached out to their clinic but nothing was done  - She is able to take Advil, which takes the sharpness off    She's had a sharp migrating neuropathic pain in her toes for two weeks  Voltaren did not work for her - ended up causing GI ulcers    Past Medical History:   Diagnosis Date   . Anxiety    . Arthritis    . Cancer (CMS HCC)     internal skin cancer   . Chronic pain     lower back   . Depression    . Dyspnea on exertion    . Essential hypertension 02/16/2017    patient denies   . GERD (gastroesophageal reflux disease)     controlled w/med   . Headache    . Heart murmur 1979    benign   . Hyperlipidemia LDL goal < 100 08/29/2013    denies   . MINOR CAD (coronary artery disease) 11/10/2012    no treatment other than cholesterol medications; follows regularly with Dr. Rodney Booze   . Multinodular goiter    . Muscle weakness     neck    . Obesity    . Peripheral edema    . Rash     face only eczema   . Rheumatic fever 16    no complications   . S/P thyroidectomy    . Squamous cell carcinoma 02/06/2015    right side of neck   . 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 Vaginal prolapse 2012    surgery improved significantly   . Wears glasses      Review of Systems:   With exception to systems documented in HPI, all  other ROS were negative per patient.    Medications:   aspirin (ECOTRIN) 81 mg Oral Tablet, Delayed Release (E.C.), Take 81 mg by mouth Once a day  Azelaic Acid 20 % Cream, Apply topically Twice daily (Patient taking differently: Apply topically Twice per day as needed)  buPROPion (WELLBUTRIN XL) 300 mg extended release 24 hr tablet, Take 1 Tablet (300 mg total) by mouth Once a day  cetirizine (ZYRTEC) 10 mg Oral Tablet, Take 1 Tablet (10 mg total) by mouth Once a day  ciprofloxacin HCl (CIPRO) 500 mg Oral Tablet, Take 1 Tablet (500 mg total) by mouth Twice daily  clobetasoL (TEMOVATE) 0.05 % Ointment, APPLY TOPICALLY ROUTE TWICE DAILY FOR 90 DAYS  erythromycin (ROMYCIN) 5 mg/gram (0.5 %) Ophthalmic Ointment, Instill into right eye Twice daily  FLUoxetine 20 mg Oral Tablet, Take 3 tablets  daily (60 mg total)  fluticasone propionate (FLONASE) 50 mcg/actuation Nasal Spray, Suspension, 1 Spray by Each Nostril route Once a day  Halobetasol Propionate (ULTRAVATE) 0.05 % Cream, Apply topically Twice per day as needed Apply  topically. Three times a week  ipratropium-albuterol 0.5 mg-3 mg(2.5 mg base)/3 mL Solution for Nebulization, 3 mL by Nebulization route Four times a day  methocarbamoL (ROBAXIN) 500 mg Oral Tablet, Take 1 Tablet (500 mg total) by mouth Four times a day  Metronidazole (METROCREAM) 0.75 % Cream, Apply topically  mineral oil-hydrophil petrolat (AQUAPHOR) Ointment, Apply topically Once per day as needed  mirtazapine (REMERON) 15 mg Oral Tablet, Take 1 Tablet (15 mg total) by mouth Every night  montelukast (SINGULAIR) 10 mg Oral Tablet, Take 1 Tab (10 mg total) by mouth Once a day  nitroGLYCERIN (NITROSTAT) 0.4 mg Sublingual Tablet, Sublingual, 1 Tab (0.4 mg total) by Sublingual route Every 5 minutes as needed for Chest pain for up to 3 doses for 3 doses over 15 minutes  nystatin (NYSTOP) 100,000 unit/gram Powder, Apply once or twice daily to PREVENT rash under breasts  pantoprazole (PROTONIX) 40 mg Oral  Tablet, Delayed Release (E.C.), Take 1 Tablet (40 mg total) by mouth Once a day  tolterodine (DETROL) 2 mg Oral Tablet, Take 2 mg by mouth Once a day  triamcinolone acetonide (ARISTOCORT A) 0.1 % Ointment, by Apply Topically route Twice daily (Patient taking differently: Apply topically Twice per day as needed)  gabapentin (NEURONTIN) 600 mg Oral Tablet, Take 1 Tablet (600 mg total) by mouth Twice daily    No facility-administered medications prior to visit.    Allergies:  Allergies   Allergen Reactions   . Cymbalta [Duloxetine]      Rash on feet and hands   . Blue Dye      Told to avoid due to patch test result by Simone Curia   . Flavoring Agent  Other Adverse Reaction (Add comment)     Patient unsure of reaction   . Lipitor [Atorvastatin] Myalgia     Muscle pain   . Nickel      Blisters  "Allergic to all kinds of metal"    . Codeine Itching     RASH   . Mobic [Meloxicam] Nausea/ Vomiting     Nervous and shakes    . Naprosyn [Naproxen] Nausea/ Vomiting   . Nsaids (Non-Steroidal Anti-Inflammatory Drug) Nausea/ Vomiting     OBJECTIVE  BP 124/82   Pulse 99   Temp 36.6 C (97.8 F) (Thermal Scan)   Resp 20   Ht 1.651 m (5\' 5" )   Wt 91.2 kg (201 lb 1 oz)   SpO2 98%   BMI 33.46 kg/m     Constitutional/General: moderate  distress, Obese in weight, well kempt  HEENT:  external ear without deformity  Lungs: Symmetric chest rise BIL   Cardiovascular: Well perfused   Extremities: no cyanosis or edema  Skin: warm and dry, no rash  Neurologic: gait is normal, AOx3, CN 2-12 grossly intact  Psychiatric: normal affect and behavior    ASSESSMENT/PLAN  [name] a 65 y.o. female presenting for APV     2 ICD-10-CM    1. 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 Carpal tunnel syndrome of right wrist  G56.01 Refer to [place]   3. Right hand pain  M79.641 Refer to [place]   4. Lower extremity pain  M79.606  ANKLE BRACHIAL INDICES   5. Arthritis of multiple  sites  M13.0 diclofenac sodium (VOLTAREN) 1 % Gel   6. Bilateral foot pain  M79.671 Referral to External Provider (AMB)    [phone]      Neuropathy  Refill Neurontin placed, further refills per PCP    Lower extremity pain - will get ABIs to test for arterial insufficiency    For arthritis pain - her NSAID availability is very limited by insensitivities - will try topical Voltaren    Foot pain - will send to [facility] as a provider there told her he'd be able to help with the nerve pain    Hand pain/carpal tunnel - will refer to ortho hand    Hardie Pulley, MD 03/30/2021, 15:42  Department of Family Medicine  Assistant Professor

## 2021-03-30 NOTE — Telephone Encounter (Signed)
Regarding: [name] Pt// Rx Refill and is having issues with Rx that Neuro gave her, Pt is allergic  ----- Message from Velna Hatchet sent at 03/30/2021 10:51 AM EDT -----  Alvis Lemmings, PA-C    Pt called in requesting a refill for the following Rx. Pt states that she has been trying to reach Dr. Kirk Ruths since 5/23, her message is below. Please advise.    --gabapentin (NEURONTIN) 600 mg Oral Tablet, Take 1 Tablet (600 mg total) by mouth Twice daily     "I have been dealing with a lot of pain in the Right foot .Marland KitchenMarland KitchenMarland KitchenI got a shot in my hip.....and it is helping with the pain in my hip .Marland Kitchen...but not in my foot  the pain intensified and the anti inflammatory medication he put me on ...is Mobic .Marland Kitchen...which I have been getting sick on had to stop taking them...Marland KitchenMarland KitchenI have try to get a hold of him called and left Message....never returned.Marland KitchenMarland KitchenMarland KitchenCan you please refer me to [facility]....the pain gets unbearable at times and Advil is not working with it ...thank you [name].."    Preferred Pharmacy     [id number] - [address], [place] [address]    Phone: [phone] Fax: [phone]    Hours: Not open 24 hours        Thanks,  Autumn Whisper Pearline Cables

## 2021-04-03 ENCOUNTER — Other Ambulatory Visit: Payer: Self-pay

## 2021-04-03 ENCOUNTER — Ambulatory Visit
Admission: RE | Admit: 2021-04-03 | Discharge: 2021-04-03 | Disposition: A | Discharge status: Home / Self Care | Payer: MEDICAID | Source: Ambulatory Visit | Attending: Family Medicine | Admitting: Family Medicine

## 2021-04-03 DIAGNOSIS — M79606 Pain in leg, unspecified: Secondary | ICD-10-CM | POA: Insufficient documentation

## 2021-04-03 DIAGNOSIS — I739 Peripheral vascular disease, unspecified: Secondary | ICD-10-CM

## 2021-04-10 ENCOUNTER — Other Ambulatory Visit: Payer: Self-pay

## 2021-04-10 ENCOUNTER — Ambulatory Visit
Admission: RE | Admit: 2021-04-10 | Discharge: 2021-04-10 | Disposition: A | Discharge status: Home / Self Care | Payer: MEDICAID | Source: Ambulatory Visit | Attending: ORTHOPEDIC, SPORTS MEDICINE | Admitting: ORTHOPEDIC, SPORTS MEDICINE

## 2021-04-10 ENCOUNTER — Ambulatory Visit (HOSPITAL_BASED_OUTPATIENT_CLINIC_OR_DEPARTMENT_OTHER): Payer: MEDICAID | Admitting: Student in an Organized Health Care Education/Training Program

## 2021-04-10 ENCOUNTER — Ambulatory Visit (HOSPITAL_BASED_OUTPATIENT_CLINIC_OR_DEPARTMENT_OTHER): Payer: MEDICAID | Admitting: ORTHOPEDIC, SPORTS MEDICINE

## 2021-04-10 ENCOUNTER — Encounter (HOSPITAL_BASED_OUTPATIENT_CLINIC_OR_DEPARTMENT_OTHER): Payer: Self-pay | Admitting: ORTHOPEDIC, SPORTS MEDICINE

## 2021-04-10 VITALS — BP 134/75 | HR 92 | Temp 97.7°F | Ht 65.32 in | Wt 203.3 lb

## 2021-04-10 VITALS — BP 139/79 | HR 94 | Temp 97.2°F | Ht 65.16 in | Wt 203.0 lb

## 2021-04-10 DIAGNOSIS — M7742 Metatarsalgia, left foot: Secondary | ICD-10-CM

## 2021-04-10 DIAGNOSIS — G5601 Carpal tunnel syndrome, right upper limb: Secondary | ICD-10-CM

## 2021-04-10 DIAGNOSIS — M7741 Metatarsalgia, right foot: Secondary | ICD-10-CM | POA: Insufficient documentation

## 2021-04-10 DIAGNOSIS — M79641 Pain in right hand: Secondary | ICD-10-CM

## 2021-04-10 DIAGNOSIS — M79672 Pain in left foot: Secondary | ICD-10-CM

## 2021-04-10 DIAGNOSIS — Z6833 Body mass index (BMI) 33.0-33.9, adult: Secondary | ICD-10-CM

## 2021-04-10 DIAGNOSIS — M25521 Pain in right elbow: Secondary | ICD-10-CM

## 2021-04-10 DIAGNOSIS — M79671 Pain in right foot: Secondary | ICD-10-CM

## 2021-04-10 NOTE — H&P (Signed)
PATIENT NAME: [name], [name] [facility] NUMBER:  [id number]  DATE OF SERVICE: 04/10/2021  DATE OF BIRTH:  [birth year]    HISTORY AND PHYSICAL    CHIEF COMPLAINT:  Right elbow and right hand pain.    HISTORY OF PRESENT ILLNESS:  [name] is a 65 year old female, new patient.  She is right-hand dominant.  The patient was referred by Dr. Hardie Pulley.  The patient reports a 2-week history of new onset right hand pain.  She reports a history of cervical stenosis and has been seen by surgical spine for this.  Patient states she was supposed to receive a nerve block for her cervical neck problems but her insurance would not approve it. She reports constant pain in the right hand that is diffuse in nature, but most of it in the thenar muscle area.  She reports nighttime throbbing of the hand.  Quality of discomfort is reported as "aching and stabbing" with a pain scale rating of an "8".  The patient has undergone an x-ray of the right hand on January 27, 2021, and results from that show mild scattered PIP and DIP joint osteophytosis greater in the second and third DIP joints.  Mild radiocarpal osteoarthritis, tiny rounded calcification or small ossicle adjacent to the ulnar styloid, which is unchanged.  Back in 2012, Dr. Alma Downs performed a right carpal tunnel release.  In July 2019,  the patient underwent an EMG nerve conduction study of the right arm which was limited due to poor relaxation.  There was no electrodiagnostic evidence suggestive of median neuropathy at the right wrist or right ulnar neuropathy.  No definitive electrodiagnostic evidence of cervical radiculopathy.    PAST MEDICAL HISTORY:  Acid reflux, thyroid disease, heart disease, depression, and osteoarthritis.    PAST SURGICAL HISTORY:  2012 a right carpal tunnel release.  Per Epic, partial thyroidectomy, gastroscopy, cystocele repair, colonoscopy, surgery for an ankle fracture, total vaginal hysterectomy, revision ulnar nerve at the elbow on the left,  tonsillectomy and adenoidectomy, cholecystectomy, heart catheterization, parotidectomy, and oophorectomy.    ALLERGIES:  1. CYMBALTA.  2. BLUE DYE FLAVORING AGENT.  3. LIPITOR.  4. NICKLE.  5. CODEINE.  6. MOBIC.  7. NAPROSYN.  8. NSAIDS.    MEDICATIONS:  1. Nystop powder.  2. MetroCream.  3. Fluoxetine.  4. Temovate ointment.  5. Nitroglycerin.  6. Flonase nasal spray.  7. Singulair.  8. Zyrtec.  9. Remeron.    SOCIAL HISTORY:  Disabled.  Denies smoking or the use of other nicotine products.  Denies alcohol use.  Denies recreational drug use.    FAMILY HISTORY:  Negative for blood clots, bleeding problems or problems with anesthesia.    Per Epic, father deceased, history of congestive heart failure, coronary artery disease.  Mother deceased, history of diabetes, elevated cholesterol, hypertension, and coronary artery disease.    REVIEW OF SYSTEMS:  Positive for numbness and tingling in her feet, depression, frequent urination and urgency, easy bruising.    PHYSICAL EXAMINATION:  General appearance:  Appears healthy, well developed, well nourished, in no acute distress.   Vital signs:  Blood pressure 134/75, pulse 92, temperature 36.5 degrees Celsius.   Skin: Warm, dry, and intact.  No evidence of bruising.   Head:  Normocephalic without evidence of trauma.    Eyes ears, nose and throat:  Eyes and ears are symmetrical.   Chest:  Breathing is nonlabored.  Respirations are easy and regular.   Cardiac:  Radial pulse is regular.  Abdomen:  Soft nondistended.   Musculoskeletal:  Exam is limited to bilateral upper extremities.  Motor function of both hands are intact.  Able to extend her thumbs, make an O, cross 2 fingers and spread them out.  She was very sensitive to touch in the wrist area and had a positive Tinel in the right hand with shooting pains that radiated into her fingers.  Her carpal compression exam was inconsistent as she declared that she had increased sensation of tingling in the small finger.   Two-point discrimination was done on the right hand.  The thumb was a 0, index 6, middle, 5, ring 5, small 4.  She seems to have within normal limits of wrist motion, flexion, extension, supination, and pronation.   Neurological:  Alert and oriented x4.    Psychological:  Affect is appropriate to today's visit.    IMAGING:  X-rays were reviewed from April, and do show the arthritis as described above in the HPI.    ASSESSMENT:  65 year old female, new patient, presenting today with somewhat diffuse right hand pain, as well as right elbow pain, which we feel is tennis elbow.      PLAN:   1. Discussed in regard to her hand wearing a wrist support to see if that will provide her with some measure of comfort.  The patient was agreeable with this and the order was placed.  Patient was prescribed a cock up splint for right wrist pain. The patient has pain of her right upper extremity which requires stabilization from this semi-rigid/rigid orthosis to improve their function.   2. Discussed sending patient to physical therapy for her hand and elbow but patient refused and stated she could not go to therapy because of her neck.   3. Dr. Estill Cotta showed patient how to do stretching for tennis elbow symptoms.  4. Patient was offered an injection to wrist/elbow but refused.  5. Our followup will be p.r.n.  6. [name] was given the opportunity to ask questions today and appeared satisfied with the treatment plan.        A shared visit with the cosigning physician was performed.    [place], APRN, NP-C  [place]     I personally interviewed and examined this patient.  I made the diagnosis and recommended the plan of care.  The note was documented by Dena Billet who saw and examined the patient with me.  My findings are consistent with those documented in the note above. I made changes to the above note where appropriate.     Discussed the multiple treatment options for her  multiple complaints today.  We elected to proceed with self-directed stretching exercises for her right tennis elbow.  We also discussed the utility of a repeat nerve study, steroid injection for her carpal tunnel today or continued bracing.  The patient deferred the repeat nerve study and steroid injection elect to continue with the brace.    Frutoso Chase, MD  [facility]                  DD:  04/10/2021 13:34:20  DT:  04/10/2021 14:57:41 [place]  D#:  [id number]

## 2021-04-10 NOTE — Progress Notes (Unsigned)
Name: [name]  MEDICAL RECORD [id number]  DATE OF BIRTH: 1955-12-16  DATE OF SERVICE: 04/10/2021       SUBJECTIVE:  [name] is a 65 y.o. year old who presents to clinic today for evaluation of Foot Pain (Right)    She reports about a 3-week history of pain in the right foot that started around the base of the great toe as a sharp pain.  She then had pain off and on in that toe and then it seemed to persist and spread to all of her toes.  She really points across the MTP joints as the location of pain today and then more recently started having pain as what she describes as tingling across the bottom of her foot.  It just seems to go across the arch from the ball of her foot to her heel.  She denies any injury.  She denies any change in footwear.  She notes that they start to the tingling across the arch when she has been resting.        PAST MEDICAL HISTORY  Past Medical History:   Diagnosis Date   . Anxiety    . Arthritis    . Cancer (CMS HCC)     internal skin cancer   . Chronic pain     lower back   . Depression    . Dyspnea on exertion    . Essential hypertension 02/16/2017    patient denies   . GERD (gastroesophageal reflux disease)     controlled w/med   . Headache    . Heart murmur 1979    benign   . Hyperlipidemia LDL goal < 100 08/29/2013    denies   . MINOR CAD (coronary artery disease) 11/10/2012    no treatment other than cholesterol medications; follows regularly with Dr. Rodney Booze   . Multinodular goiter    . Muscle weakness     neck    . Obesity    . Peripheral edema    . Rash     face only eczema   . Rheumatic fever 16    no complications   . S/P thyroidectomy    . Squamous cell carcinoma 02/06/2015    right side of neck   . 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 Vaginal prolapse 2012    surgery improved significantly   . Wears glasses       PAST SURGICAL HISTORY  Past Surgical History:   Procedure Laterality Date   . Colonoscopy  01/06/09   . Gastroscopy  03/05/2010   .  Gastroscopy with biopsy  03/05/2010   . Hx adenoidectomy     . Hx ankle fracture tx  2007   . Hx appendectomy     . Hx cholecystectomy     . Hx colonoscopy     . Hx cystocele repair  09/24/09   . Hx hand surgery  2012   . Hx heart catheterization     . Hx oophorectomy     . Hx parotidectomy      . Hx partial thyroidectomy Right 04/05/2019   . Hx tonsillectomy     . Hx total vaginal hysterectomy  1979   . Hx wrist fracture tx  2007   . Pb revise ulnar nerve at elbow Left 1979   . Pb upper gi endoscopy,biopsy  12/19/07     MEDICATIONS  Current Outpatient Medications   Medication Sig   . aspirin (ECOTRIN) 81  mg Oral Tablet, Delayed Release (E.C.) Take 81 mg by mouth Once a day   . Azelaic Acid 20 % Cream Apply topically Twice daily (Patient taking differently: Apply topically Twice per day as needed)   . buPROPion (WELLBUTRIN XL) 300 mg extended release 24 hr tablet Take 1 Tablet (300 mg total) by mouth Once a day   . cetirizine (ZYRTEC) 10 mg Oral Tablet Take 1 Tablet (10 mg total) by mouth Once a day   . ciprofloxacin HCl (CIPRO) 500 mg Oral Tablet Take 1 Tablet (500 mg total) by mouth Twice daily   . clobetasoL (TEMOVATE) 0.05 % Ointment APPLY TOPICALLY ROUTE TWICE DAILY FOR 90 DAYS   . diclofenac sodium (VOLTAREN) 1 % Gel Apply topically Four times a day - before meals and bedtime   . erythromycin (ROMYCIN) 5 mg/gram (0.5 %) Ophthalmic Ointment Instill into right eye Twice daily   . FLUoxetine 20 mg Oral Tablet Take 3 tablets daily (60 mg total)   . fluticasone propionate (FLONASE) 50 mcg/actuation Nasal Spray, Suspension 1 Spray by Each Nostril route Once a day   . gabapentin (NEURONTIN) 600 mg Oral Tablet Take 1 Tablet (600 mg total) by mouth Twice daily   . Halobetasol Propionate (ULTRAVATE) 0.05 % Cream Apply topically Twice per day as needed Apply  topically. Three times a week   . ipratropium-albuterol 0.5 mg-3 mg(2.5 mg base)/3 mL Solution for Nebulization 3 mL by Nebulization route Four times a day   .  methocarbamoL (ROBAXIN) 500 mg Oral Tablet Take 1 Tablet (500 mg total) by mouth Four times a day   . Metronidazole (METROCREAM) 0.75 % Cream Apply topically   . mineral oil-hydrophil petrolat (AQUAPHOR) Ointment Apply topically Once per day as needed   . mirtazapine (REMERON) 15 mg Oral Tablet Take 1 Tablet (15 mg total) by mouth Every night   . montelukast (SINGULAIR) 10 mg Oral Tablet Take 1 Tab (10 mg total) by mouth Once a day   . nitroGLYCERIN (NITROSTAT) 0.4 mg Sublingual Tablet, Sublingual 1 Tab (0.4 mg total) by Sublingual route Every 5 minutes as needed for Chest pain for up to 3 doses for 3 doses over 15 minutes   . nystatin (NYSTOP) 100,000 unit/gram Powder Apply once or twice daily to PREVENT rash under breasts   . pantoprazole (PROTONIX) 40 mg Oral Tablet, Delayed Release (E.C.) Take 1 Tablet (40 mg total) by mouth Once a day   . tolterodine (DETROL) 2 mg Oral Tablet Take 2 mg by mouth Once a day   . triamcinolone acetonide (ARISTOCORT A) 0.1 % Ointment by Apply Topically route Twice daily (Patient taking differently: Apply topically Twice per day as needed)     ALLERGIES  Allergies   Allergen Reactions   . Cymbalta [Duloxetine]      Rash on feet and hands   . Blue Dye      Told to avoid due to patch test result by Simone Curia   . Flavoring Agent  Other Adverse Reaction (Add comment)     Patient unsure of reaction   . Lipitor [Atorvastatin] Myalgia     Muscle pain   . Nickel      Blisters  "Allergic to all kinds of metal"    . Codeine Itching     RASH   . Mobic [Meloxicam] Nausea/ Vomiting     Nervous and shakes    . Naprosyn [Naproxen] Nausea/ Vomiting   . Nsaids (Non-Steroidal Anti-Inflammatory Drug) Nausea/ Vomiting  PHYSICAL EXAM  BP 139/79   Pulse 94   Temp 36.2 C (97.2 F) (Thermal Scan)   Ht 1.655 m (5' 5.16")   Wt 92.1 kg (203 lb 0.7 oz)   BMI 33.63 kg/m        In no acute distress with appropriate mood and affect.     CV: Pulses palpable and equal distally.   DERM: Skin is warm and  dry to touch.  right foot:  She is tender to palpation at the first through third MTP joints.  Squeeze test across this area is minimally positive.  She is also tender to palpation across the plantar fashion and over the medial calcaneal eminence with increased pain with passive dorsiflexion of the ankle.  Gait is normal.  She is grossly neurologically intact to sensation across the lower extremity.        RADIOGRAPHY:  Bilateral foot films are obtained and demonstrate mild degenerative arthrosis at the MTP joint of the great toe, otherwise, no significant abnormality.        ASSESSMENT:    2 ICD-10-CM    1. Pain in both feet  M79.671 Bilateral Feet Weight Bearing x-ray    M79.672 AMB CON/REF PT ORTHO SHOULDER/SPORTS   2. Metatarsalgia of both feet  M77.41 AMB CON/REF PT ORTHO SHOULDER/SPORTS    M77.42      PLAN:  At today's visit, we discussed treatment options.   She seems to have 2 separate focuses of pain in her foot some of which seems to be due to metatarsalgia and she also has some mild plantar fasciitis. I would like for her to try a formal physical therapy.  Additionally, we have  given her some metatarsal pads to try to see if this alleviates some of her pain more at the forefoot.        If the patient has any questions or concerns prior to next visit, she can contact our office.        Corena Pilgrim, MD 04/10/2021, 16:49

## 2021-04-12 ENCOUNTER — Other Ambulatory Visit: Payer: Self-pay

## 2021-04-12 ENCOUNTER — Ambulatory Visit (INDEPENDENT_AMBULATORY_CARE_PROVIDER_SITE_OTHER): Payer: MEDICAID

## 2021-04-12 ENCOUNTER — Encounter (INDEPENDENT_AMBULATORY_CARE_PROVIDER_SITE_OTHER): Payer: Self-pay

## 2021-04-12 VITALS — BP 126/82 | HR 98 | Temp 98.2°F | Resp 18 | Ht 65.0 in | Wt 203.9 lb

## 2021-04-12 DIAGNOSIS — K047 Periapical abscess without sinus: Secondary | ICD-10-CM

## 2021-04-12 DIAGNOSIS — Z6833 Body mass index (BMI) 33.0-33.9, adult: Secondary | ICD-10-CM

## 2021-04-12 MED ORDER — AMOXICILLIN 875 MG-POTASSIUM CLAVULANATE 125 MG TABLET
1.0000 | ORAL_TABLET | Freq: Two times a day (BID) | ORAL | 0 refills | Status: DC
Start: 2021-04-12 — End: 2021-05-18
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
## 2021-04-12 NOTE — Progress Notes (Unsigned)
Right ear pain that started last night  Pt also states she has some swelling along her R neck  No fever  No ringing in ears or ear discharge  Has been taking Advil as needed  No other associated symptoms including nasal congestion, cough, n/v  No covid 19 exposures  No trauma or injury to ear  Pt states she has history of parotid gland removal surgery

## 2021-04-14 ENCOUNTER — Other Ambulatory Visit: Payer: Self-pay

## 2021-04-14 ENCOUNTER — Ambulatory Visit: Payer: MEDICAID | Attending: Anesthesiology | Admitting: Anesthesiology

## 2021-04-14 ENCOUNTER — Other Ambulatory Visit (INDEPENDENT_AMBULATORY_CARE_PROVIDER_SITE_OTHER): Payer: Self-pay

## 2021-04-14 ENCOUNTER — Ambulatory Visit (HOSPITAL_BASED_OUTPATIENT_CLINIC_OR_DEPARTMENT_OTHER): Payer: Self-pay | Admitting: Physician Assistant

## 2021-04-14 ENCOUNTER — Encounter (INDEPENDENT_AMBULATORY_CARE_PROVIDER_SITE_OTHER): Payer: Self-pay | Admitting: Anesthesiology

## 2021-04-14 VITALS — BP 135/116 | HR 92 | Temp 97.2°F | Ht 65.0 in | Wt 203.9 lb

## 2021-04-14 DIAGNOSIS — R220 Localized swelling, mass and lump, head: Secondary | ICD-10-CM | POA: Insufficient documentation

## 2021-04-14 DIAGNOSIS — Z1239 Encounter for other screening for malignant neoplasm of breast: Secondary | ICD-10-CM

## 2021-04-14 DIAGNOSIS — M542 Cervicalgia: Secondary | ICD-10-CM | POA: Insufficient documentation

## 2021-04-14 DIAGNOSIS — Z7189 Other specified counseling: Secondary | ICD-10-CM

## 2021-04-14 DIAGNOSIS — M25551 Pain in right hip: Secondary | ICD-10-CM | POA: Insufficient documentation

## 2021-04-14 DIAGNOSIS — M2550 Pain in unspecified joint: Secondary | ICD-10-CM | POA: Insufficient documentation

## 2021-04-14 DIAGNOSIS — M5412 Radiculopathy, cervical region: Secondary | ICD-10-CM | POA: Insufficient documentation

## 2021-04-14 DIAGNOSIS — Z6833 Body mass index (BMI) 33.0-33.9, adult: Secondary | ICD-10-CM

## 2021-04-14 DIAGNOSIS — M47812 Spondylosis without myelopathy or radiculopathy, cervical region: Secondary | ICD-10-CM | POA: Insufficient documentation

## 2021-04-14 NOTE — Nursing Note (Signed)
Patient's chart reviewed for potential enrollment into CCM program. At present, patient not appropriate for CCM enrollment.    Gwenneth Whiteman, RN

## 2021-04-14 NOTE — Progress Notes (Signed)
[facility]  Return Outpatient Note    Date:  04/14/2021  Age:  65 y.o.  Referring Physician:   No referring provider defined for this encounter.     SPINE F/U    Subjective:   65 year old female who was seen in the APP clinic on 12/01/20 and by Dr. Margart Sickles on 02/24/21.    Lumbar MRI had shown no significant pathology to explain her right hip/leg pain. She had a positive Patrick's test and was tender over the trochanter. She underwent a right hip differential on 03/06/21: excellent relief x 1 week. She is allergic to Mobic. She saw podiatry: foot symptoms better with metatarsal shoe lift.     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 A C6 SNRB had been ordered but denied. C/o sharp pain in the posterior neck with associated h/a. She gets a burning pain into the posterior left shoulder with intermittent global numbness in the left arm and leg. No regular RUE symptoms (h/o right CTR). She was recently diagnosed with "right tennis elbow": saw Ortho. The proximal pain is the worst. 8/10 in intensity today. Symptoms worsen with lying down, lifting, sitting, running, using stairs, and exercises. Symptoms may improve temporarily with medication, heat, and ice. She falls intermittently. She has longstanding bladder issues: sees Urology. During her first PT session, cervical traction caused pain along the spine. Therapy was halted.     She notes a 3 day h/o pain below the right ear with facial swelling. Urgent care started abx. She has a h/o right hemithyroidectomy and parotidectomy (side not specified). No tobacco since 2006.       Objective:   Vital Signs:  BP (!) 135/116    Pulse 92    Temp 36.2 C (97.2 F)    Ht 1.651 m (5\' 5" )    Wt 92.5 kg (203 lb 14.8 oz)    BMI 33.93 kg/m       Constitutional  General appearance: Normal  Cardiovascular:   Peripheral vascular system: Normal  HEENT:  Heent: trace edema right face/lateral  neck  Musculoskeletal  Gait and Station: : Normal  Muscle strength (upper extremities): : Normal (splint right wrist)  Muscle strength (lower extremities): : Normal  Muscle tone (upper extremities): : Normal  Muscle tone (lower extremities): : Normal  Sensation: Normal  Deep tendon reflexes upper and lower extremities: Normal   Coordination: Normal   Tender posterior neck  Negative lhermitte's, babinski, ankle clonus, hoffman's  Neurological  Orientation: Normal  Recent and remote memory: Normal  Attention span and concentration: Normal  Language: Normal  Fund of knowledge: Normal    Data reviewed  Prior imaging was reviewed.     Assessment:      ICD-10-CM    1. Right hip pain  M25.551 Refer to [facility]   2. 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 Joint pain  M25.50 Referral to Rheumatology   4. Swelling of right side of face  R22.0 Refer to [facility]     CT SOFT TISSUE NECK WO IV CONTRAST     CT SINUSES WO IV CONTRAST   5. Neck pain  M54.2 CT CERVICAL NERVE ROOT BLOCK (C1-C7)   6. Cervical radiculopathy  M54.12 CT CERVICAL NERVE ROOT BLOCK (C1-C7)       Recommendations  -The natural history, film findings, and indications for treatment were discussed.  -  Her primary complaint continues to be the neck pain radiating into the shoulder and arm: therapist stopped PT due to adverse symptoms.  -The C6 SNRB will be re-ordered.  -A referral to Rheumatology for multiple joint pain has been made.  -A referral to Ortho for right hip pain (+response to hip differential) has been made.  -Referral to ENT for right facial swelling in setting of prior thyroidectomy and parotidectomy (CT sinuses wo contrast and CT soft tissue neck wo contrast should be done prior: ordered).  -RTC here in 3 months to reassess after the above.    -A copy of the note from today's clinic appointment will be sent via fax or mail to the patient's PCP and/or referring physician on file.      Sharman Crate, PA-C 04/16/2021, 08:53    The patient was seen as a shared visit with the co-signing faculty.

## 2021-04-14 NOTE — Telephone Encounter (Signed)
Regarding: dal[place] pt // order request   ----- Message from N[place] sent at 04/14/2021 11:27 AM EDT -----  Alvis Lemmings, PA-C    Patient calling in wanting to know if an order can be placed so she can get her Mammogram.    Thank you,  Jinny Blossom

## 2021-04-14 NOTE — Progress Notes (Unsigned)
I personally saw and evaluated the patient. See mid-level's note for additional details. My findings/participation are : Pt states that she has had excellent relief with the hip injection for a week and then her pain has gradually returned. She also has wrist and shoulder pain and foot pain. She is wearing a right wrist splint. Recently she developed pain just below her right ear abd facial swelling and had to go to urgent care where she was prescribed antibiotics. Her neck pain remains as before with radiation into the shoulder and arm. I will get a CT soft tissue neck and sinuses and will refer her to our ENT colleagues. I will also refer to rheumatology and an orthopedics surgeon for her right hip pain. I will re-order a right C6 SNRB. FU in three months.    Debbora Dus, MD

## 2021-04-15 DIAGNOSIS — M19072 Primary osteoarthritis, left ankle and foot: Secondary | ICD-10-CM

## 2021-04-15 DIAGNOSIS — M19071 Primary osteoarthritis, right ankle and foot: Secondary | ICD-10-CM

## 2021-04-23 ENCOUNTER — Other Ambulatory Visit (INDEPENDENT_AMBULATORY_CARE_PROVIDER_SITE_OTHER): Payer: Self-pay | Admitting: Adult Health

## 2021-04-23 DIAGNOSIS — R220 Localized swelling, mass and lump, head: Secondary | ICD-10-CM

## 2021-04-28 ENCOUNTER — Ambulatory Visit (HOSPITAL_BASED_OUTPATIENT_CLINIC_OR_DEPARTMENT_OTHER)
Admission: RE | Admit: 2021-04-28 | Discharge: 2021-04-28 | Disposition: A | Discharge status: Home / Self Care | Payer: MEDICAID | Source: Ambulatory Visit

## 2021-04-28 ENCOUNTER — Ambulatory Visit (HOSPITAL_BASED_OUTPATIENT_CLINIC_OR_DEPARTMENT_OTHER): Payer: MEDICAID

## 2021-04-28 ENCOUNTER — Encounter (HOSPITAL_BASED_OUTPATIENT_CLINIC_OR_DEPARTMENT_OTHER): Payer: Self-pay | Admitting: Physician Assistant

## 2021-04-28 ENCOUNTER — Other Ambulatory Visit (HOSPITAL_BASED_OUTPATIENT_CLINIC_OR_DEPARTMENT_OTHER): Payer: MEDICAID

## 2021-04-28 ENCOUNTER — Ambulatory Visit: Payer: MEDICAID | Attending: Physician Assistant | Admitting: Physician Assistant

## 2021-04-28 ENCOUNTER — Ambulatory Visit (HOSPITAL_BASED_OUTPATIENT_CLINIC_OR_DEPARTMENT_OTHER): Payer: Self-pay | Admitting: Physician Assistant

## 2021-04-28 ENCOUNTER — Other Ambulatory Visit: Payer: Self-pay

## 2021-04-28 VITALS — BP 116/76 | HR 110 | Temp 97.3°F | Wt 201.7 lb

## 2021-04-28 DIAGNOSIS — I10 Essential (primary) hypertension: Secondary | ICD-10-CM | POA: Insufficient documentation

## 2021-04-28 DIAGNOSIS — E041 Nontoxic single thyroid nodule: Secondary | ICD-10-CM

## 2021-04-28 DIAGNOSIS — E785 Hyperlipidemia, unspecified: Secondary | ICD-10-CM

## 2021-04-28 DIAGNOSIS — E890 Postprocedural hypothyroidism: Secondary | ICD-10-CM

## 2021-04-28 DIAGNOSIS — Z6833 Body mass index (BMI) 33.0-33.9, adult: Secondary | ICD-10-CM

## 2021-04-28 DIAGNOSIS — R220 Localized swelling, mass and lump, head: Secondary | ICD-10-CM

## 2021-04-28 LAB — THYROID STIMULATING HORMONE WITH FREE T4 REFLEX: TSH: 2.156 u[IU]/mL (ref 0.430–3.550)

## 2021-04-28 LAB — BASIC METABOLIC PANEL
ANION GAP: 7 mmol/L (ref 4–13)
BUN/CREA RATIO: 12 (ref 6–22)
BUN: 13 mg/dL (ref 8–25)
CALCIUM: 9.4 mg/dL (ref 8.8–10.2)
CHLORIDE: 109 mmol/L (ref 96–111)
CO2 TOTAL: 23 mmol/L (ref 23–31)
CREATININE: 1.13 mg/dL — ABNORMAL HIGH (ref 0.60–1.05)
ESTIMATED GFR: 55 mL/min/BSA — ABNORMAL LOW (ref >=60)
GLUCOSE: 90 mg/dL (ref 65–125)
POTASSIUM: 4.6 mmol/L (ref 3.5–5.1)
SODIUM: 139 mmol/L (ref 136–145)

## 2021-04-28 LAB — LIPID PANEL
CHOL/HDL RATIO: 6.3
CHOLESTEROL: 228 mg/dL — ABNORMAL HIGH (ref 100–200)
HDL CHOL: 36 mg/dL — ABNORMAL LOW (ref >=50)
LDL CALC: 139 mg/dL — ABNORMAL HIGH (ref <100)
NON-HDL: 192 mg/dL — ABNORMAL HIGH (ref <=190)
TRIGLYCERIDES: 267 mg/dL — ABNORMAL HIGH (ref <150)
VLDL CALC: 53 mg/dL — ABNORMAL HIGH (ref <30)

## 2021-04-28 MED ORDER — IOPAMIDOL 300 MG IODINE/ML (61 %) INTRAVENOUS SOLUTION
100.0000 mL | INTRAVENOUS | Status: AC
Start: 2021-04-28 — End: 2021-04-28
  Administered 2021-04-28: 100 mL via INTRAVENOUS

## 2021-04-28 NOTE — Progress Notes (Deleted)
Department of Family Medicine   Progress Note    [name]  MRN: [id number]  DOB: 1956-07-23  Date of Service: 04/28/2021    CHIEF COMPLAINT  Chief Complaint   Patient presents with   . Follow Up 6 Months     General 6 month follow up.  On antibiotic for sinuses past week, reports not working.       SUBJECTIVE  [name] is a 65 y.o. female who presents to clinic for ***.             Review of Systems:  Positive ROS discussed in HPI, otherwise all other systems negative.      Medications:   amoxicillin-pot clavulanate (AUGMENTIN) 875-125 mg Oral Tablet, Take 1 Tablet by mouth Every 12 hours  aspirin (ECOTRIN) 81 mg Oral Tablet, Delayed Release (E.C.), Take 81 mg by mouth Once a day  Azelaic Acid 20 % Cream, Apply topically Twice daily (Patient taking differently: Apply topically Twice per day as needed)  buPROPion (WELLBUTRIN XL) 300 mg extended release 24 hr tablet, Take 1 Tablet (300 mg total) by mouth Once a day  cetirizine (ZYRTEC) 10 mg Oral Tablet, Take 1 Tablet (10 mg total) by mouth Once a day  ciprofloxacin HCl (CIPRO) 500 mg Oral Tablet, Take 1 Tablet (500 mg total) by mouth Twice daily  clobetasoL (TEMOVATE) 0.05 % Ointment, APPLY TOPICALLY ROUTE TWICE DAILY FOR 90 DAYS  diclofenac sodium (VOLTAREN) 1 % Gel, Apply topically Four times a day - before meals and bedtime  erythromycin (ROMYCIN) 5 mg/gram (0.5 %) Ophthalmic Ointment, Instill into right eye Twice daily  FLUoxetine 20 mg Oral Tablet, Take 3 tablets daily (60 mg total)  fluticasone propionate (FLONASE) 50 mcg/actuation Nasal Spray, Suspension, 1 Spray by Each Nostril route Once a day  gabapentin (NEURONTIN) 600 mg Oral Tablet, Take 1 Tablet (600 mg total) by mouth Twice daily  Halobetasol Propionate (ULTRAVATE) 0.05 % Cream, Apply topically Twice per day as needed Apply  topically. Three times a week  ipratropium-albuterol 0.5 mg-3 mg(2.5 mg base)/3 mL Solution for Nebulization, 3 mL by Nebulization route Four times a  day  methocarbamoL (ROBAXIN) 500 mg Oral Tablet, Take 1 Tablet (500 mg total) by mouth Four times a day  Metronidazole (METROCREAM) 0.75 % Cream, Apply topically  mineral oil-hydrophil petrolat (AQUAPHOR) Ointment, Apply topically Once per day as needed  mirtazapine (REMERON) 15 mg Oral Tablet, Take 1 Tablet (15 mg total) by mouth Every night  montelukast (SINGULAIR) 10 mg Oral Tablet, Take 1 Tab (10 mg total) by mouth Once a day  nitroGLYCERIN (NITROSTAT) 0.4 mg Sublingual Tablet, Sublingual, 1 Tab (0.4 mg total) by Sublingual route Every 5 minutes as needed for Chest pain for up to 3 doses for 3 doses over 15 minutes  nystatin (NYSTOP) 100,000 unit/gram Powder, Apply once or twice daily to PREVENT rash under breasts  pantoprazole (PROTONIX) 40 mg Oral Tablet, Delayed Release (E.C.), Take 1 Tablet (40 mg total) by mouth Once a day  tolterodine (DETROL) 2 mg Oral Tablet, Take 2 mg by mouth Once a day  triamcinolone acetonide (ARISTOCORT A) 0.1 % Ointment, by Apply Topically route Twice daily (Patient taking differently: Apply topically Twice per day as needed)    No facility-administered medications prior to visit.      Allergies:   Allergies   Allergen Reactions   . Cymbalta [Duloxetine]      Rash on feet and hands   . Blue Dye  Told to avoid due to patch test result by Simone Curia   . Flavoring Agent  Other Adverse Reaction (Add comment)     Patient unsure of reaction   . Lipitor [Atorvastatin] Myalgia     Muscle pain   . Nickel      Blisters  "Allergic to all kinds of metal"    . Codeine Itching     RASH   . Mobic [Meloxicam] Nausea/ Vomiting     Nervous and shakes    . Naprosyn [Naproxen] Nausea/ Vomiting   . Nsaids (Non-Steroidal Anti-Inflammatory Drug) Nausea/ Vomiting         OBJECTIVE  BP 116/76 (Site: Left, Patient Position: Sitting, Cuff Size: Adult Large)   Pulse (!) 110   Temp 36.3 C (97.3 F) (Thermal Scan)   Wt 91.5 kg (201 lb 11.5 oz)   SpO2 96%   BMI 33.57 kg/m       General: no  distress  HENT: TMs clear, mouth mucous membranes moist, pharynx without injection or exudate   Lungs: clear to auscultation bilaterally  Cardiovascular: RRR, no murmur  Abdomen: soft, non tender, bowel sounds present  Extremities: no cyanosis or edema  Skin: warm and dry, no rash  Neurologic: gait is normal, AOx3, CN 2-12 grossly intact  Psychiatric: normal affect and behavior    ASSESSMENT/PLAN  No diagnosis found.      No orders of the defined types were placed in this encounter.        No follow-ups on file.    On the day of the encounter, a total of  *** ([phone], [phone], [phone]) minutes was spent on this patient encounter including review of historical information, examination, documentation and post-visit activities.     Alvis Lemmings, PA-C 04/28/2021, 10:35

## 2021-04-28 NOTE — Telephone Encounter (Signed)
Regarding: [place] Pt  \\  inquiry  ----- Message from Latimer Ever sent at 04/28/2021  4:06 PM EDT -----  Alvis Lemmings, PA-C    Pt was ordered a CT SOFT TISSUE NECK WO IV CONTRAST from their neurologist and now want to know if their PCP wants them to have it.  Please advise.    Thank you so much for all your time and assistance!  Have a GREAT day!!!  --Estill Bamberg

## 2021-04-29 ENCOUNTER — Other Ambulatory Visit (HOSPITAL_BASED_OUTPATIENT_CLINIC_OR_DEPARTMENT_OTHER): Payer: MEDICAID

## 2021-04-29 ENCOUNTER — Encounter (HOSPITAL_COMMUNITY): Payer: Self-pay | Admitting: Diagnostic Radiology

## 2021-04-29 DIAGNOSIS — R220 Localized swelling, mass and lump, head: Secondary | ICD-10-CM

## 2021-04-30 ENCOUNTER — Other Ambulatory Visit: Payer: Self-pay

## 2021-04-30 ENCOUNTER — Other Ambulatory Visit (HOSPITAL_BASED_OUTPATIENT_CLINIC_OR_DEPARTMENT_OTHER): Payer: MEDICAID

## 2021-04-30 ENCOUNTER — Ambulatory Visit: Payer: MEDICAID | Attending: PHYSICIAN ASSISTANT | Admitting: NURSE PRACTITIONER, FAMILY

## 2021-04-30 ENCOUNTER — Ambulatory Visit (HOSPITAL_BASED_OUTPATIENT_CLINIC_OR_DEPARTMENT_OTHER): Payer: MEDICAID | Admitting: Audiologist

## 2021-04-30 VITALS — BP 121/76 | HR 94 | Temp 98.7°F | Ht 65.0 in | Wt 201.7 lb

## 2021-04-30 DIAGNOSIS — H903 Sensorineural hearing loss, bilateral: Secondary | ICD-10-CM

## 2021-04-30 DIAGNOSIS — R220 Localized swelling, mass and lump, head: Secondary | ICD-10-CM

## 2021-04-30 DIAGNOSIS — Z889 Allergy status to unspecified drugs, medicaments and biological substances status: Secondary | ICD-10-CM

## 2021-04-30 DIAGNOSIS — H9201 Otalgia, right ear: Secondary | ICD-10-CM

## 2021-04-30 DIAGNOSIS — J309 Allergic rhinitis, unspecified: Secondary | ICD-10-CM

## 2021-04-30 DIAGNOSIS — E890 Postprocedural hypothyroidism: Secondary | ICD-10-CM

## 2021-04-30 DIAGNOSIS — Z822 Family history of deafness and hearing loss: Secondary | ICD-10-CM

## 2021-04-30 DIAGNOSIS — R1310 Dysphagia, unspecified: Secondary | ICD-10-CM

## 2021-04-30 DIAGNOSIS — H9193 Unspecified hearing loss, bilateral: Secondary | ICD-10-CM | POA: Insufficient documentation

## 2021-04-30 DIAGNOSIS — Z9049 Acquired absence of other specified parts of digestive tract: Secondary | ICD-10-CM

## 2021-04-30 DIAGNOSIS — Z6833 Body mass index (BMI) 33.0-33.9, adult: Secondary | ICD-10-CM

## 2021-04-30 MED ORDER — FLUTICASONE PROPIONATE 50 MCG/ACTUATION NASAL SPRAY,SUSPENSION
2.0000 | Freq: Every day | NASAL | 11 refills | Status: DC
Start: 2021-04-30 — End: 2022-08-16

## 2021-04-30 MED ORDER — LORATADINE 10 MG TABLET
10.0000 mg | ORAL_TABLET | Freq: Every day | ORAL | 3 refills | Status: DC
Start: 2021-04-30 — End: 2022-11-11

## 2021-04-30 NOTE — Progress Notes (Signed)
PATIENT NAME:  [name]  MRN:  [id number]  DOB:  [birth year]  DATE OF SERVICE: 04/30/2021    HPI:  Lucile Hillmann is a 65 y.o. year old female seen in the clinic today for evaluation of ear pain.  She states her symptoms started with right-sided ear pain approximately April 12, 2021.  She was seen for probable ear infection and treated with an oral antibiotic for 10 days.  After 3 days on the antibiotic, her pain ceased and she began to have swelling of the right side of her face.  She comments on having shooting pains in the right ear.  She also has noticed some difficulty swallowing.  She states that this has been a chronic problem but has worsened lately.  She notes solids getting stuck in the back of her throat at times.  No history of swallow study.     The patient also questions results of a CT ordered by her neurologist performed on April 28, 2021.  She states that she had the CT soft tissue of the neck.  She has a history of a right hemithyroidectomy and right parotidectomy.  She states that she was examined and there was no evidence of dental abscess.  She does have seasonal allergies, particularly worse in the summer.  Signs and symptoms of allergies are sneezing, stuffy nose, and sinus congestion.  She states that the swelling in her face is worse typically in the morning and goes down as the day goes on.  She notices mid face and right parietal skull area headaches.  Had some blurry vision at times.  She has not been seen by her eye doctor.     Denies testing for allergies.  She states that she was on Singulair, but stopped this on her own during the wintertime.  Summer is typically her worst season for seasonal allergies.  Has taken Singulair and Flonase.  Denies use of saline rinses.     History of hearing loss in the right ear over the past 2 months.     Family history of hearing loss for mother and father and paternal uncles.  Occasional tinnitus bilaterally.  Denies surgery for her ears.   Denies trauma or injury to the head or ears.  History of frequent ear infections as a child and early adulthood, but these stopped several years ago when she stopped smoking.  Denies noise exposure.          Physical Exam:  Blood pressure 121/76, pulse 94, temperature 37.1 C (98.7 F), height 1.651 m (5\' 5" ), weight 91.5 kg (201 lb 11.5 oz), not currently breastfeeding.  Body mass index is 33.57 kg/m.  General Appearance: Pleasant, cooperative and in no acute distress.  Wearing mask.   Eyes: Conjunctivae/corneas clear, PERRLA.  Wearing glasses.    Head and Face: Normocephalic, atraumatic.  Face symmetric, no obvious lesions.   Pinnae: Normal shape and position.   External auditory canals:  Patent without inflammation AU.  Tympanic membranes:  Right and Left:  Intact, translucent and middle ear aerated.  Nose:  External pyramid midline. Septum midline. Mucosa normal/clear. No purulence, polyps, or crusts.   Oral Cavity/Oropharynx: No mucosal lesions, masses, or pharyngeal asymmetry.  Thick oropharynx and large uvula.  Missing lower teeth. Upper denture plate.  High arched palate.    Hypopharynx/Larynx: Unsuccessful attempt at mirror indirect exam with gagging with thick oropharynx and large uvula and voice normal.  Neck:  No palpable thyroid, salivary gland, or neck masses.  Supple.  Heme/Lymph:  No cervical adenopathy.  Cardiovascular:  Good perfusion of upper extremities.  No cyanosis of the hands or fingers.  Lungs: No apparent stridorous breathing. No acute distress.  Skin: Skin warm and dry.  Neurologic: Cranial nerves:  grossly intact.  Psychiatric:  Alert and oriented x 3.      Procedure:  N/A      Data Reviewed:     Study Result    1.  Narrative & Impression   KELLIS TOPETE  Female, 65 years old.    CT SOFT TISSUE NECK W IV CONTRAST performed on 04/28/2021 4:40 PM.    REASON FOR EXAM:  R22.0: Swelling of right side of face    RADIATION DOSE: 388.10 mGycm  CONTRAST: ml's of    COMPARISON: CT scan of  the neck 04/13/2019   FINDINGS: There are are are postsurgical changes of right-sided thyroidectomy. The fat and fascial planes in the neck are preserved. There is no mass. There is no fluid collection. The pharyngeal and laryngeal morphology is preserved. There are no lymph nodes that demonstrate abnormal features. Visualized lung apices are clear.    The cervical vasculature is preserved. There is mild cervical spondylosis.      IMPRESSION:  There is a right parotid resection and right hemithyroidectomy are present. No acute abnormality is identified. No neck mass is identified.       2.  Audiogram with tympanogram performed during the office visit today.  The patient has normal physiological ear canal volumes bilaterally.  Type A tympanogram with normal middle ear pressure mobility in both ears.  SRT is 50 decibels right ear and 30 decibels left ear.  Good word recognition, 84% at 90 decibels right ear, and excellent word recognition, 96% at 70 decibels in the left ear.  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  Discussed possible amplification if the patient is interested following medical clearance.  The patient is interested in pursuing hearing aid evaluation.        Assessment:  1. Swelling of right side of face    2. Otalgia of right ear    3. Allergic rhinitis    4. Dysphagia, unspecified type    5. SNHL (sensory-neural hearing loss), asymmetrical    6. History of seasonal allergies    7. Family history of hearing loss    8. History of thyroidectomy    9. History of parotidectomy        Plan:  Orders Placed This Encounter   . FLUORO ESOPHAGRAM, MODIFIED SWALLOW   . AUDIOLOGY EVALUATION(REF AUDIOLOGY)-POC   . fluticasone propionate (FLONASE) 50 mcg/actuation Nasal Spray, Suspension   . loratadine (CLARITIN) 10 mg Oral Tablet     1.  Audiogram:   See Data Review above.  2.  Audiogram repeat in 6 months to monitor asymmetrical hearing  3. Saline nasal rinses twice a day and more if needed  4.  Restart Flonase 2 sprays each nostril daily  5.  Continue Singulair 10 mg daily  6.  Claritin 10 mg daily   7.  Schedule HAE with Bobette Mo, AuD.as patient desires.   8.  MBSS and follow-up with Donia Pounds, APRN for results  9.  Add Claritin 10 mg po to allergy management.  10.  If acute or recurrent facial swelling or ear pain, follow-up  in ED or office ASAP.  Discussed importance of monitoring swelling especially facial and orbital swelling.    11.  Patient verbalized understanding of instructions and plan of care and follow-up as discussed.    Kern Reap, APRN,FNP-BC 04/30/2021, 16:18    PCP:  Alvis Lemmings, PA-C  [place] [id number]   REF:  Sharman Crate, PA-C  [address]  [address]  [place] [id number]         Portions of this note may be dictated using voice recognition software. Variances in spelling and vocabulary are possible and unintentional. Not all errors are caught/corrected. Please notify the [facility] if any discrepancies are noted or if the meaning of any statement is not clear.

## 2021-04-30 NOTE — Patient Instructions (Addendum)
Saline nasal rinses twice a day and more if needed  Restart Flonase 2 sprays each nostril daily  Continue Singulair 10 mg daily  Claritin 10 mg daily                                NASAL IRRIGATION/RINSES    Benefits:  1.  When you irrigate, the saline (saltwater solution) acts as a solvent and washes the mucus crusts and other debris from the nose.  2.  The sinus passages are moisturized.  3.  Studies have also shown that a nasal irrigation improves cell function (the cells that have fine filaments to move the mucus out work better)    The Recipe:  *Choose a one quart jar or container that is thoroughly cleansed.    *Fill with water that has been distilled, previously boiled, or otherwise sterilized.  *Add 1 to 1.5 heaping teaspoons of pickling/canning salt, NOT table salt, as it contains a large number of additives.    *Add 1 teaspoon baking soda (pure bicarbonate).  *Mix ingredients together and store at room temperature.  Discard after one week.    The Instructions:    You should plan to irrigate your nose with buffered saline 2 to 3 times per day.  You may use a sinus rinse bottle (for example [employer] or [employer])  Always pour the amount of fluid you plan to use into a clean container/bowl.   Many people prefer to warm the solution slightly in the microwave, but be sure that the solution is NOT HOT. Transfer cooled solution to rinse bottle.  Bend over the sink (some people prefer to do this in the shower) and squirt the solution into each side of your nose, aiming the stream toward the back of your head, NOT the top of your head.  It will not harm you if you swallow a little.      Some people experience a little burning sensation the first few time they use buffered saline solution, but this usually goes away after they adapt to it.      After irrigating, apply a lubricant (such as vaseline) to the inside of the nose with a clean cotton swab.  If told to do so by your healthcare provider, you may apply  mupirocin ointment instead (also with a cotton swab).

## 2021-04-30 NOTE — Progress Notes (Signed)
Chief complaint: Hearing loss  Assessment: Sensitivity   Right Ear: Mild to moderately severe downward sloping sensorineural hearing loss   Left Ear: Mild to moderately severe downward sloping sensorineural hearing loss   Speech Understanding  Right Ear: Speech understanding at enhanced levels was 84% in quiet.  Left Ear: Speech understanding at enhanced levels was  92% in quiet.   Recommendations: ENT evaluation/follow-up  Retest as needed, Amplification if interested pending medical clearance

## 2021-05-01 ENCOUNTER — Ambulatory Visit
Admission: RE | Admit: 2021-05-01 | Discharge: 2021-05-01 | Disposition: A | Discharge status: Home / Self Care | Payer: MEDICAID | Source: Ambulatory Visit | Attending: Physician Assistant | Admitting: Physician Assistant

## 2021-05-01 ENCOUNTER — Encounter (HOSPITAL_COMMUNITY): Payer: Self-pay

## 2021-05-01 DIAGNOSIS — Z1239 Encounter for other screening for malignant neoplasm of breast: Secondary | ICD-10-CM

## 2021-05-01 DIAGNOSIS — Z1231 Encounter for screening mammogram for malignant neoplasm of breast: Secondary | ICD-10-CM | POA: Insufficient documentation

## 2021-05-07 ENCOUNTER — Ambulatory Visit (INDEPENDENT_AMBULATORY_CARE_PROVIDER_SITE_OTHER): Payer: Self-pay | Admitting: NURSE PRACTITIONER, FAMILY

## 2021-05-07 ENCOUNTER — Emergency Department
Admission: EM | Admit: 2021-05-07 | Discharge: 2021-05-07 | Discharge status: Against Medical Advice | Payer: MEDICAID | Source: Ambulatory Visit

## 2021-05-07 ENCOUNTER — Other Ambulatory Visit (HOSPITAL_BASED_OUTPATIENT_CLINIC_OR_DEPARTMENT_OTHER): Payer: MEDICAID

## 2021-05-07 DIAGNOSIS — Z5321 Procedure and treatment not carried out due to patient leaving prior to being seen by health care provider: Secondary | ICD-10-CM | POA: Insufficient documentation

## 2021-05-07 NOTE — Telephone Encounter (Signed)
Regarding: [name]  ----- Message from [place] sent at 05/07/2021 11:20 AM EDT -----  Pt called and said she has been taking the medication for the sinus and she feels great but she is having issues with her R eyebrow drooping and is puffy over her eye. Should she come to the clinic to be seen or go to the ER

## 2021-05-07 NOTE — Telephone Encounter (Signed)
Pt stated pt is having facial drooping. Pt advised to go to nearest ED. Pt stated understanding. No further questions noted.

## 2021-05-14 ENCOUNTER — Ambulatory Visit: Payer: MEDICAID | Attending: ORTHOPEDIC, SPORTS MEDICINE | Admitting: ORTHOPEDIC, SPORTS MEDICINE

## 2021-05-14 ENCOUNTER — Encounter (HOSPITAL_BASED_OUTPATIENT_CLINIC_OR_DEPARTMENT_OTHER): Payer: Self-pay | Admitting: ORTHOPEDIC, SPORTS MEDICINE

## 2021-05-14 ENCOUNTER — Other Ambulatory Visit: Payer: Self-pay

## 2021-05-14 VITALS — BP 136/87 | HR 83 | Temp 96.4°F

## 2021-05-14 DIAGNOSIS — M25551 Pain in right hip: Secondary | ICD-10-CM

## 2021-05-14 DIAGNOSIS — M7742 Metatarsalgia, left foot: Secondary | ICD-10-CM | POA: Insufficient documentation

## 2021-05-14 DIAGNOSIS — M79672 Pain in left foot: Secondary | ICD-10-CM

## 2021-05-14 DIAGNOSIS — M7741 Metatarsalgia, right foot: Secondary | ICD-10-CM | POA: Insufficient documentation

## 2021-05-14 DIAGNOSIS — M79671 Pain in right foot: Secondary | ICD-10-CM | POA: Insufficient documentation

## 2021-05-14 NOTE — Progress Notes (Signed)
Name: [name]  DATE OF BIRTH: [birth year]  DATE OF SERVICE: 05/14/2021    SUBJECTIVE:  [name] presents to clinic today for follow up of Foot Pain (Right)  When I last saw her, she had a combination of some plantar fascia pain and metatarsalgia.  We had given her a metatarsal pad.  Discussed change in footwear and recommended some physical therapy.  She has not been able to go to physical therapy due to some other medical issues.  At this point, though, she is doing better since she has changed her shoes.  She is using arch supports and feels that the heel pain in the forefoot pain have both improved significantly, although not completely resolved.        MEDICATIONS  Current Outpatient Medications   Medication Sig   . amoxicillin-pot clavulanate (AUGMENTIN) 875-125 mg Oral Tablet Take 1 Tablet by mouth Every 12 hours (Patient not taking: No sig reported)   . aspirin (ECOTRIN) 81 mg Oral Tablet, Delayed Release (E.C.) Take 81 mg by mouth Once a day   . Azelaic Acid 20 % Cream Apply topically Twice daily (Patient taking differently: Apply topically Twice per day as needed)   . buPROPion (WELLBUTRIN XL) 300 mg extended release 24 hr tablet Take 1 Tablet (300 mg total) by mouth Once a day   . cetirizine (ZYRTEC) 10 mg Oral Tablet Take 1 Tablet (10 mg total) by mouth Once a day   . ciprofloxacin HCl (CIPRO) 500 mg Oral Tablet Take 1 Tablet (500 mg total) by mouth Twice daily   . clobetasoL (TEMOVATE) 0.05 % Ointment APPLY TOPICALLY ROUTE TWICE DAILY FOR 90 DAYS   . diclofenac sodium (VOLTAREN) 1 % Gel Apply topically Four times a day - before meals and bedtime   . erythromycin (ROMYCIN) 5 mg/gram (0.5 %) Ophthalmic Ointment Instill into right eye Twice daily   . FLUoxetine 20 mg Oral Tablet Take 3 tablets daily (60 mg total)   . fluticasone propionate (FLONASE) 50 mcg/actuation Nasal Spray, Suspension Administer 2 Sprays into each nostril Once a day   . gabapentin  (NEURONTIN) 600 mg Oral Tablet Take 1 Tablet (600 mg total) by mouth Twice daily   . Halobetasol Propionate (ULTRAVATE) 0.05 % Cream Apply topically Twice per day as needed Apply  topically. Three times a week   . ipratropium-albuterol 0.5 mg-3 mg(2.5 mg base)/3 mL Solution for Nebulization 3 mL by Nebulization route Four times a day   . loratadine (CLARITIN) 10 mg Oral Tablet Take 1 Tablet (10 mg total) by mouth Once a day   . methocarbamoL (ROBAXIN) 500 mg Oral Tablet Take 1 Tablet (500 mg total) by mouth Four times a day   . Metronidazole (METROCREAM) 0.75 % Cream Apply topically   . mineral oil-hydrophil petrolat (AQUAPHOR) Ointment Apply topically Once per day as needed   . mirtazapine (REMERON) 15 mg Oral Tablet Take 1 Tablet (15 mg total) by mouth Every night   . montelukast (SINGULAIR) 10 mg Oral Tablet Take 1 Tab (10 mg total) by mouth Once a day   . nitroGLYCERIN (NITROSTAT) 0.4 mg Sublingual Tablet, Sublingual 1 Tab (0.4 mg total) by Sublingual route Every 5 minutes as needed for Chest pain for up to 3 doses for 3 doses over 15 minutes   . nystatin (NYSTOP) 100,000 unit/gram Powder Apply once or twice daily to PREVENT rash under breasts   . pantoprazole (PROTONIX) 40 mg Oral Tablet, Delayed Release (E.C.) Take  1 Tablet (40 mg total) by mouth Once a day   . tolterodine (DETROL) 2 mg Oral Tablet Take 2 mg by mouth Once a day   . triamcinolone acetonide (ARISTOCORT A) 0.1 % Ointment by Apply Topically route Twice daily (Patient taking differently: Apply topically Twice per day as needed)     ALLERGIES  Allergies   Allergen Reactions   . Cymbalta [Duloxetine]      Rash on feet and hands   . Blue Dye      Told to avoid due to patch test result by [facility]   . Flavoring Agent  Other Adverse Reaction (Add comment)     Patient unsure of reaction   . Lipitor [Atorvastatin] Myalgia     Muscle pain   . Nickel      Blisters  "Allergic to all kinds of metal"    . Codeine Itching     RASH   . Mobic [Meloxicam] Nausea/  Vomiting     Nervous and shakes    . Naprosyn [Naproxen] Nausea/ Vomiting   . Nsaids (Non-Steroidal Anti-Inflammatory Drug) Nausea/ Vomiting       PHYSICAL EXAM  BP 136/87   Pulse 83   Temp 35.8 C (96.4 F) (Thermal Scan)        Right foot:  She is minimally tender at the dorsum of the second and third MTP joints, but has no pain with passive or active motion of the joints.  She is tender to palpation over the plantar fascia, but this starts about a centimeter or so distal to the calcaneus and extends to the mid plantar fascia.  The remainder of the foot and ankle exam is unremarkable.  Gait is normal.  ASSESSMENT:  1. Metatarsalgia of both feet    2. Right hip pain    3. Pain in both feet      PLAN:  At today's visit, we discussed treatment options.  Her feet are doing very well.  I encouraged her to continue with the arch supports that she is using.  She can use the metatarsal pad if she is having return of the metatarsalgia.  We will plan to see her back on an as-needed basis.      If the patient has any questions or concerns, she can contact our office.      Corena Pilgrim, MD 05/14/2021, 17:20

## 2021-05-17 NOTE — Progress Notes (Signed)
Department of Family Medicine   Progress Note    [name]  MRN: [id number]  DOB: [birth year]  Date of Service: 04/28/2021    CHIEF COMPLAINT  Chief Complaint   Patient presents with    Follow Up 6 Months     General 6 month follow up.  On antibiotic for sinuses past week, reports not working.       SUBJECTIVE  [name] is a 65 y.o. female who presents to clinic for her 6 month follow up.    Patient has previous diagnosis of hypertension. She does check blood pressures at home. They have been controlled. She is not on medication therapy for the treatment of hypertension. Patient denies headaches, vision changes, chest pains, palpitations.     Hyperlipidemia is currently not managed with mefication. Patient doing well without complaints. Last lipid panel was 04/2020.     She is following with neurosurgery for management of her cervical and lumbar radiculopathy. Symptoms are currently well managed and she has not had any acute flares recently.      Review of Systems:  Positive ROS discussed in HPI, otherwise all other systems negative.      Medications:   amoxicillin-pot clavulanate (AUGMENTIN) 875-125 mg Oral Tablet, Take 1 Tablet by mouth Every 12 hours  aspirin (ECOTRIN) 81 mg Oral Tablet, Delayed Release (E.C.), Take 81 mg by mouth Once a day  Azelaic Acid 20 % Cream, Apply topically Twice daily (Patient taking differently: Apply topically Twice per day as needed)  buPROPion (WELLBUTRIN XL) 300 mg extended release 24 hr tablet, Take 1 Tablet (300 mg total) by mouth Once a day  cetirizine (ZYRTEC) 10 mg Oral Tablet, Take 1 Tablet (10 mg total) by mouth Once a day  ciprofloxacin HCl (CIPRO) 500 mg Oral Tablet, Take 1 Tablet (500 mg total) by mouth Twice daily  clobetasoL (TEMOVATE) 0.05 % Ointment, APPLY TOPICALLY ROUTE TWICE DAILY FOR 90 DAYS  diclofenac sodium (VOLTAREN) 1 % Gel, Apply topically Four times a day - before meals and bedtime  erythromycin (ROMYCIN) 5 mg/gram (0.5 %) Ophthalmic  Ointment, Instill into right eye Twice daily  FLUoxetine 20 mg Oral Tablet, Take 3 tablets daily (60 mg total)  fluticasone propionate (FLONASE) 50 mcg/actuation Nasal Spray, Suspension, 1 Spray by Each Nostril route Once a day  gabapentin (NEURONTIN) 600 mg Oral Tablet, Take 1 Tablet (600 mg total) by mouth Twice daily  Halobetasol Propionate (ULTRAVATE) 0.05 % Cream, Apply topically Twice per day as needed Apply  topically. Three times a week  ipratropium-albuterol 0.5 mg-3 mg(2.5 mg base)/3 mL Solution for Nebulization, 3 mL by Nebulization route Four times a day  methocarbamoL (ROBAXIN) 500 mg Oral Tablet, Take 1 Tablet (500 mg total) by mouth Four times a day  Metronidazole (METROCREAM) 0.75 % Cream, Apply topically  mineral oil-hydrophil petrolat (AQUAPHOR) Ointment, Apply topically Once per day as needed  mirtazapine (REMERON) 15 mg Oral Tablet, Take 1 Tablet (15 mg total) by mouth Every night  montelukast (SINGULAIR) 10 mg Oral Tablet, Take 1 Tab (10 mg total) by mouth Once a day  nitroGLYCERIN (NITROSTAT) 0.4 mg Sublingual Tablet, Sublingual, 1 Tab (0.4 mg total) by Sublingual route Every 5 minutes as needed for Chest pain for up to 3 doses for 3 doses over 15 minutes  nystatin (NYSTOP) 100,000 unit/gram Powder, Apply once or twice daily to PREVENT rash under breasts  pantoprazole (PROTONIX) 40 mg Oral Tablet, Delayed Release (E.C.), Take 1 Tablet (40 mg total) by mouth  Once a day  tolterodine (DETROL) 2 mg Oral Tablet, Take 2 mg by mouth Once a day  triamcinolone acetonide (ARISTOCORT A) 0.1 % Ointment, by Apply Topically route Twice daily (Patient taking differently: Apply topically Twice per day as needed)    No facility-administered medications prior to visit.      Allergies:   Allergies   Allergen Reactions    Cymbalta [Duloxetine]      Rash on feet and hands    Blue Dye      Told to avoid due to patch test result by [facility] [place]    Flavoring Agent  Other Adverse Reaction (Add comment)     Patient  unsure of reaction    Lipitor [Atorvastatin] Myalgia     Muscle pain    Nickel      Blisters  "Allergic to all kinds of metal"     Codeine Itching     RASH    Mobic [Meloxicam] Nausea/ Vomiting     Nervous and shakes     Naprosyn [Naproxen] Nausea/ Vomiting    Nsaids (Non-Steroidal Anti-Inflammatory Drug) Nausea/ Vomiting         OBJECTIVE  BP 116/76 (Site: Left, Patient Position: Sitting, Cuff Size: Adult Large)    Pulse (!) 110    Temp 36.3 C (97.3 F) (Thermal Scan)    Wt 91.5 kg (201 lb 11.5 oz)    SpO2 96%    BMI 33.57 kg/m       General: no distress  HENT: TMs clear, mouth mucous membranes moist, pharynx without injection or exudate   Lungs: clear to auscultation bilaterally  Cardiovascular: RRR, no murmur  Abdomen: soft, non tender, bowel sounds present  Extremities: no cyanosis or edema  Skin: warm and dry, no rash  Neurologic: gait is normal, AOx3, CN 2-12 grossly intact  Psychiatric: normal affect and behavior    ASSESSMENT/PLAN  (I10) Essential hypertension  (primary encounter diagnosis)  Plan:   Improving   At goal today in clinic   BASIC METABOLIC PANEL    (0000000) H/O thyroidectomy  Plan:   Active   Previously stable without therapy   Will assess   THYROID STIMULATING HORMONE WITH FREE T4 REFLEX    (E78.5) Hyperlipidemia, unspecified hyperlipidemia type  Plan:   Chronic   Will assess with labs   LIPID PANEL              Orders Placed This Encounter    BASIC METABOLIC PANEL    LIPID PANEL    THYROID STIMULATING HORMONE WITH FREE T4 REFLEX         Return in 6 months (on 10/29/2021), or if symptoms worsen or fail to improve.      Barbara Lemmings, PA-C 04/28/2021, 10:35

## 2021-05-18 ENCOUNTER — Other Ambulatory Visit (HOSPITAL_BASED_OUTPATIENT_CLINIC_OR_DEPARTMENT_OTHER): Payer: Self-pay | Admitting: Physician Assistant

## 2021-05-18 ENCOUNTER — Encounter (HOSPITAL_BASED_OUTPATIENT_CLINIC_OR_DEPARTMENT_OTHER): Payer: Self-pay | Admitting: Physician Assistant

## 2021-05-18 ENCOUNTER — Encounter (INDEPENDENT_AMBULATORY_CARE_PROVIDER_SITE_OTHER): Payer: Self-pay | Admitting: Urology

## 2021-05-18 DIAGNOSIS — R7989 Other specified abnormal findings of blood chemistry: Secondary | ICD-10-CM

## 2021-05-18 DIAGNOSIS — G518 Other disorders of facial nerve: Secondary | ICD-10-CM

## 2021-05-19 ENCOUNTER — Other Ambulatory Visit: Payer: Self-pay

## 2021-05-19 ENCOUNTER — Inpatient Hospital Stay
Admission: RE | Admit: 2021-05-19 | Discharge: 2021-05-19 | Disposition: A | Discharge status: Home / Self Care | Payer: MEDICAID | Source: Ambulatory Visit | Attending: Nuclear Radiology | Admitting: Nuclear Radiology

## 2021-05-19 DIAGNOSIS — M5412 Radiculopathy, cervical region: Secondary | ICD-10-CM | POA: Insufficient documentation

## 2021-05-19 DIAGNOSIS — M542 Cervicalgia: Secondary | ICD-10-CM | POA: Insufficient documentation

## 2021-05-19 DIAGNOSIS — M47812 Spondylosis without myelopathy or radiculopathy, cervical region: Secondary | ICD-10-CM | POA: Insufficient documentation

## 2021-05-19 MED ORDER — ROPIVACAINE (PF) 2 MG/ML (0.2 %) INJECTION SOLUTION
1.0000 mL | Freq: Once | INTRAMUSCULAR | Status: AC
Start: 2021-05-19 — End: 2021-05-19
  Administered 2021-05-19 (×2): 1 mL via EPIDURAL

## 2021-05-19 MED ORDER — DEXAMETHASONE SODIUM PHOSPHATE (PF) 10 MG/ML INJECTION SOLUTION
10.0000 mg | Freq: Once | INTRAMUSCULAR | Status: AC
Start: 2021-05-19 — End: 2021-05-19
  Administered 2021-05-19: 10 mg via EPIDURAL

## 2021-05-19 MED ORDER — IOPAMIDOL 200 MG IODINE/ML (41 %) INTRATHECAL SOLUTION
1.0000 mL | INTRATHECAL | Status: AC
Start: 2021-05-19 — End: 2021-05-19
  Administered 2021-05-19: 18:00:00 1 mL via EPIDURAL

## 2021-05-19 MED ORDER — LIDOCAINE HCL 10 MG/ML (1 %) INJECTION SOLUTION
3.0000 mL | Freq: Once | INTRAMUSCULAR | Status: AC
Start: 2021-05-19 — End: 2021-05-19
  Administered 2021-05-19 (×2): 30 mg

## 2021-05-19 NOTE — Discharge Instructions (Signed)
Discharge Instructions following Cervical Nerve Root Block     You may experience temporary numbness or weakness in your arm after the injection. When the numbing medication wears off, this should go away.   You should limit your activity and not drive for the remainder of the day.   You may experience a temporary increase in pain for the first 24-72 hours after the injection. This occurs because the local anesthetics were injected into an area where there is already inflammation. The anti-inflammatory effect from the steroid may take several days to take effect.    If the site of the injection is painful, you may apply an ice pack to the area to reduce the discomfort or take pain medication as directed by your physician.   You may resume all of your medications immediately following the injection, unless directed otherwise by your physician.    Please seek medical attention (PCP or local Emergency Dept.) if you experience fever, chills, redness or swelling at the injection site, increased pain, weakness or sensory changes or, rarely, changes in bladder and/or bowel function.     If it is indicated to call your physician, call [facility] at [phone], and ask the operator to page the Radiology Resident.  When calling after 4:00pm or on weekends, ask the operator to page the Radiology Resident On-Call.    If you have any further questions or concerns related to your post-procedure care, you may contact the Radiology Nurse Monday thru Friday, 7:00am-4:00pm at [phone].

## 2021-05-20 MED ORDER — GABAPENTIN 600 MG TABLET
600.0000 mg | ORAL_TABLET | Freq: Two times a day (BID) | ORAL | 5 refills | Status: DC
Start: 2021-05-20 — End: 2022-02-01

## 2021-05-25 ENCOUNTER — Other Ambulatory Visit (INDEPENDENT_AMBULATORY_CARE_PROVIDER_SITE_OTHER): Payer: Self-pay | Admitting: Nurse Practitioner

## 2021-05-25 ENCOUNTER — Other Ambulatory Visit (HOSPITAL_BASED_OUTPATIENT_CLINIC_OR_DEPARTMENT_OTHER): Payer: Self-pay | Admitting: Physician Assistant

## 2021-06-05 ENCOUNTER — Encounter (HOSPITAL_BASED_OUTPATIENT_CLINIC_OR_DEPARTMENT_OTHER): Payer: MEDICAID | Admitting: Student in an Organized Health Care Education/Training Program

## 2021-06-18 ENCOUNTER — Encounter (HOSPITAL_COMMUNITY): Payer: MEDICAID | Admitting: Student in an Organized Health Care Education/Training Program

## 2021-06-26 ENCOUNTER — Encounter (HOSPITAL_BASED_OUTPATIENT_CLINIC_OR_DEPARTMENT_OTHER): Payer: MEDICAID | Admitting: Student in an Organized Health Care Education/Training Program

## 2021-06-30 ENCOUNTER — Other Ambulatory Visit: Payer: Self-pay

## 2021-06-30 ENCOUNTER — Ambulatory Visit: Payer: MEDICAID | Attending: Audiologist | Admitting: Audiologist

## 2021-06-30 DIAGNOSIS — H903 Sensorineural hearing loss, bilateral: Secondary | ICD-10-CM | POA: Insufficient documentation

## 2021-06-30 NOTE — Progress Notes (Signed)
Patient seen today for hearing aid evaluation. Discussed styles, technologies, prices, and monaural vs binaural. Patient has Medicaid insurance and no hearing aid benefit. Provided her with information about care credit. She is not working and does not intend to go back to work. Reviewed the $1800 cost option of devices through [place] which she is interested in pursuing if she can get approved through care credit. If she is approved she would like to get the [employer] 30 RIC rechargeable aids in dark gray with non custom domes. She will contact the office to let us know if she is approved and if she would like to move forward.

## 2021-07-14 ENCOUNTER — Encounter (HOSPITAL_BASED_OUTPATIENT_CLINIC_OR_DEPARTMENT_OTHER): Payer: Self-pay | Admitting: Student in an Organized Health Care Education/Training Program

## 2021-07-14 ENCOUNTER — Ambulatory Visit
Payer: MEDICAID | Attending: PHYSICIAN ASSISTANT | Admitting: Student in an Organized Health Care Education/Training Program

## 2021-07-14 ENCOUNTER — Ambulatory Visit (HOSPITAL_BASED_OUTPATIENT_CLINIC_OR_DEPARTMENT_OTHER): Payer: MEDICAID

## 2021-07-14 DIAGNOSIS — M2550 Pain in unspecified joint: Secondary | ICD-10-CM

## 2021-07-14 DIAGNOSIS — Z6834 Body mass index (BMI) 34.0-34.9, adult: Secondary | ICD-10-CM

## 2021-07-14 LAB — COMPREHENSIVE METABOLIC PANEL, NON-FASTING
ALBUMIN: 3.7 g/dL (ref 3.4–4.8)
ALKALINE PHOSPHATASE: 70 U/L (ref 50–130)
ALT (SGPT): 21 U/L (ref 8–22)
ANION GAP: 7 mmol/L (ref 4–13)
AST (SGOT): 21 U/L (ref 8–45)
BILIRUBIN TOTAL: 0.4 mg/dL (ref 0.3–1.3)
BUN/CREA RATIO: 15 (ref 6–22)
BUN: 14 mg/dL (ref 8–25)
CALCIUM: 9 mg/dL (ref 8.8–10.2)
CHLORIDE: 109 mmol/L (ref 96–111)
CO2 TOTAL: 24 mmol/L (ref 23–31)
CREATININE: 0.94 mg/dL (ref 0.60–1.05)
ESTIMATED GFR: 68 mL/min/BSA (ref >=60)
GLUCOSE: 95 mg/dL (ref 65–125)
POTASSIUM: 4.4 mmol/L (ref 3.5–5.1)
PROTEIN TOTAL: 7.3 g/dL (ref 6.0–8.0)
SODIUM: 140 mmol/L (ref 136–145)

## 2021-07-14 LAB — CBC WITH DIFF
BASOPHIL #: <0.1 10*3/uL (ref <=0.20)
BASOPHIL %: 1 %
EOSINOPHIL #: <0.1 10*3/uL (ref <=0.50)
EOSINOPHIL %: 1 %
HCT: 42.9 % (ref 34.8–46.0)
HGB: 13.8 g/dL (ref 11.5–16.0)
IMMATURE GRANULOCYTE #: <0.1 10*3/uL (ref <0.10)
IMMATURE GRANULOCYTE %: 0 % (ref 0–1)
LYMPHOCYTE #: 1.79 10*3/uL (ref 1.00–4.80)
LYMPHOCYTE %: 29 %
MCH: 28.8 pg (ref 26.0–32.0)
MCHC: 32.2 g/dL (ref 31.0–35.5)
MCV: 89.6 fL (ref 78.0–100.0)
MONOCYTE #: 0.55 10*3/uL (ref 0.20–1.10)
MONOCYTE %: 9 %
MPV: 9.3 fL (ref 8.7–12.5)
NEUTROPHIL #: 3.78 10*3/uL (ref 1.50–7.70)
NEUTROPHIL %: 60 %
PLATELETS: 405 10*3/uL — ABNORMAL HIGH (ref 150–400)
RBC: 4.79 10*6/uL (ref 3.85–5.22)
RDW-CV: 14.2 % (ref 11.5–15.5)
WBC: 6.3 10*3/uL (ref 3.7–11.0)

## 2021-07-14 LAB — URIC ACID: URIC ACID: 4.7 mg/dL (ref 2.9–6.3)

## 2021-07-14 LAB — RHEUMATOID FACTOR, SERUM: RHEUMATOID FACTOR: <13 IU/mL (ref <=30)

## 2021-07-14 LAB — C-REACTIVE PROTEIN(CRP),INFLAMMATION: CRP INFLAMMATION: 15.3 mg/L — ABNORMAL HIGH (ref <8.0)

## 2021-07-14 LAB — SEDIMENTATION RATE: ERYTHROCYTE SEDIMENTATION RATE (ESR): 37 mm/hr — ABNORMAL HIGH (ref 0–20)

## 2021-07-14 NOTE — Progress Notes (Signed)
DIVISION OF RHEUMATOLOGY  DEPARTMENT OF MEDICINE  [facility]    NEW PATIENT VISIT    Name: [name]  DOB: May 17, 1956  Encounter Date: 07/14/2021  Patient's PCP: Alvis Lemmings, PA-C    Reason for visit:  Polyarthralgia    HPI:  This is 65 years old female patient with past medical history of spinal stenosis and plantar fasciitis the referred by PCP for polyarthralgia.  Patient's note joint pain involving hands, feet, knees, ankles without edema or swelling.  Morning stiffness less than our improved with movement.  She feels her grip is weak and unable to open jars.  Denies oral/genital/nasal ulcers, uveitis, VTE, Raynaud's.  Report history of rosacea, 1st trimester miscarriage and dry eyes.  She previously underwent parathyroidectomy and thyroidectomy.  Denies history of gout and psoriasis.  Family history positive for 2 sisters with psoriatic arthritis.  Patient had previously [facility] and received corticosteroid injection to SI joint.  She was also assessed by Orthopedic surgery for plantar fasciitis and prescribed new orthotics that improved her symptoms significantly.     Relevant Labs:  04/2021 creatinine 1.13, UA positive for protein and negative for blood (previously positive)    Relevant Imaging:  03/2021 X-ray feet bilateral mild degenerative arthropathy  MRI spine mild spondylosis   XR lumber spine unremarkable   XR hand right degenerative arthritis      Relevant Medications:  Voltaren gel  Tylenol p.r.n.    Marland Kitchen aspirin (ECOTRIN) 81 mg Oral Tablet, Delayed Release (E.C.) Take 81 mg by mouth Once a day   . Azelaic Acid 20 % Cream Apply topically Twice daily (Patient taking differently: Apply topically Twice per day as needed)   . buPROPion (WELLBUTRIN XL) 300 mg extended release 24 hr tablet Take 1 Tablet (300 mg total) by mouth Once a day   . cetirizine (ZYRTEC) 10 mg Oral Tablet Take 1 Tablet (10 mg total) by mouth Once a day   . clobetasoL (TEMOVATE) 0.05 % Ointment  APPLY TOPICALLY ROUTE TWICE DAILY FOR 90 DAYS   . diclofenac sodium (VOLTAREN) 1 % Gel Apply topically Four times a day - before meals and bedtime   . FLUoxetine 20 mg Oral Tablet Take 3 tablets daily (60 mg total)   . fluticasone propionate (FLONASE) 50 mcg/actuation Nasal Spray, Suspension Administer 2 Sprays into each nostril Once a day   . gabapentin (NEURONTIN) 600 mg Oral Tablet Take 1 Tablet (600 mg total) by mouth Twice daily   . Halobetasol Propionate (ULTRAVATE) 0.05 % Cream Apply topically Twice per day as needed Apply  topically. Three times a week   . ipratropium-albuterol 0.5 mg-3 mg(2.5 mg base)/3 mL Solution for Nebulization 3 mL by Nebulization route Four times a day   . loratadine (CLARITIN) 10 mg Oral Tablet Take 1 Tablet (10 mg total) by mouth Once a day   . methocarbamoL (ROBAXIN) 500 mg Oral Tablet Take 1 Tablet (500 mg total) by mouth Four times a day   . mineral oil-hydrophil petrolat (AQUAPHOR) Ointment Apply topically Once per day as needed   . mirtazapine (REMERON) 15 mg Oral Tablet Take 1 Tablet (15 mg total) by mouth Every night   . montelukast (SINGULAIR) 10 mg Oral Tablet Take 1 Tab (10 mg total) by mouth Once a day   . nitroGLYCERIN (NITROSTAT) 0.4 mg Sublingual Tablet, Sublingual 1 Tab (0.4 mg total) by Sublingual route Every 5 minutes as needed for Chest pain for up to 3 doses for 3  doses over 15 minutes   . nystatin (NYSTOP) 100,000 unit/gram Powder Apply once or twice daily to PREVENT rash under breasts   . pantoprazole (PROTONIX) 40 mg Oral Tablet, Delayed Release (E.C.) Take 1 Tablet (40 mg total) by mouth Once a day   . tolterodine (DETROL) 2 mg Oral Tablet Take 2 mg by mouth Once a day   . triamcinolone acetonide (ARISTOCORT A) 0.1 % Ointment by Apply Topically route Twice daily (Patient taking differently: Apply topically Twice per day as needed)       Physical:  Vitals:    07/14/21 1000   BP: 110/76   Pulse: (!) 102   SpO2: 98%   Weight: 93.6 kg (206 lb 5.6 oz)   Height:  1.651 m (5\' 5" )   BMI: 34.41         HEENT: normal  Skin: no visible rash, no tophi  Hands: No MCP or PIP synovitis.  Wrists: Normal ROM. No swelling or warmth.  Elbows: Normal flexion/extension. No swelling or effusion.   Shoulders: Normal ROM.   Knees: No effusion, tenderness, warmth.  Ankles: No swelling. Normal ROM.  Feet: Negative MTP squeeze.    Diagnoses during this visit:    ENCOUNTER DIAGNOSES     ICD-10-CM   1. Joint pain  M25.50       Assessment/Plan:    This this is a 65 years old female patient with past medical history of spinal stenosis and plantar fasciitis the referred by PCP for polyarthralgia.  Based on history of negative synovitis and morning stiffness less than hours low suspicion for inflammatory arthritis.  Will obtain further workup to rule out rheumatoid arthritis and imaging of the hands and wrist.     Orders Placed This Encounter   . XR HAND AND WRIST BI, ARTHRITIS SERIES   . Rheumatoid Factor   . CYCLIC CITRULLINATED PEPTIDE ANTIBODIES, IGG, SERUM   . C-REACTIVE PROTEIN(CRP),INFLAMMATION   . Sedimentation Rate   . Comprehensive Metabolic Panel, Nf   . Cbc/Diff   . Uric Acid     Return if symptoms worsen or fail to improve.    Deitra Mayo, MD   Rheumatology Fellow     Rheumatology attending attestation:    I saw and examined the patient along with Dr Faustino Congress. I reviewed Dr Al-Harthi's note and agree with the findings and plan of care as documented in the note above.    Dannial Monarch, MD  Assistant Professor  Department of Medicine, Section of Rheumatology  [facility]  Work cell: [phone]

## 2021-07-15 LAB — CYCLIC CITRULLINATED PEPTIDE ANTIBODIES, IGG, SERUM
CYCLIC CITRULLINATED PEPTIDE ANTIBODY IGG QUAL: NEGATIVE
CYCLIC CITRULLINATED PEPTIDE ANTIBODY IGG QUANT: <0.5 U/mL (ref <3.0)

## 2021-07-16 ENCOUNTER — Encounter (HOSPITAL_BASED_OUTPATIENT_CLINIC_OR_DEPARTMENT_OTHER): Payer: Self-pay | Admitting: Student in an Organized Health Care Education/Training Program

## 2021-07-21 ENCOUNTER — Encounter (INDEPENDENT_AMBULATORY_CARE_PROVIDER_SITE_OTHER): Payer: Self-pay | Admitting: Anesthesiology

## 2021-07-21 ENCOUNTER — Ambulatory Visit: Payer: MEDICAID | Attending: NURSE PRACTITIONER | Admitting: Anesthesiology

## 2021-07-21 ENCOUNTER — Ambulatory Visit (INDEPENDENT_AMBULATORY_CARE_PROVIDER_SITE_OTHER): Payer: MEDICAID | Admitting: Student in an Organized Health Care Education/Training Program

## 2021-07-21 ENCOUNTER — Other Ambulatory Visit: Payer: Self-pay

## 2021-07-21 VITALS — BP 146/72 | HR 87 | Temp 97.0°F | Ht 65.0 in | Wt 207.2 lb

## 2021-07-21 VITALS — BP 134/70 | HR 92 | Ht 65.0 in | Wt 208.1 lb

## 2021-07-21 DIAGNOSIS — M25551 Pain in right hip: Secondary | ICD-10-CM | POA: Insufficient documentation

## 2021-07-21 DIAGNOSIS — M545 Low back pain, unspecified: Secondary | ICD-10-CM | POA: Insufficient documentation

## 2021-07-21 DIAGNOSIS — M47812 Spondylosis without myelopathy or radiculopathy, cervical region: Secondary | ICD-10-CM | POA: Insufficient documentation

## 2021-07-21 DIAGNOSIS — M5090 Cervical disc disorder, unspecified, unspecified cervical region: Secondary | ICD-10-CM | POA: Insufficient documentation

## 2021-07-21 DIAGNOSIS — G5601 Carpal tunnel syndrome, right upper limb: Secondary | ICD-10-CM | POA: Insufficient documentation

## 2021-07-21 DIAGNOSIS — F331 Major depressive disorder, recurrent, moderate: Secondary | ICD-10-CM

## 2021-07-21 DIAGNOSIS — F4310 Post-traumatic stress disorder, unspecified: Secondary | ICD-10-CM

## 2021-07-21 DIAGNOSIS — F332 Major depressive disorder, recurrent severe without psychotic features: Secondary | ICD-10-CM

## 2021-07-21 DIAGNOSIS — Z6834 Body mass index (BMI) 34.0-34.9, adult: Secondary | ICD-10-CM

## 2021-07-21 DIAGNOSIS — M542 Cervicalgia: Secondary | ICD-10-CM | POA: Insufficient documentation

## 2021-07-21 DIAGNOSIS — M2550 Pain in unspecified joint: Secondary | ICD-10-CM | POA: Insufficient documentation

## 2021-07-21 DIAGNOSIS — M79604 Pain in right leg: Secondary | ICD-10-CM | POA: Insufficient documentation

## 2021-07-21 DIAGNOSIS — R220 Localized swelling, mass and lump, head: Secondary | ICD-10-CM | POA: Insufficient documentation

## 2021-07-21 DIAGNOSIS — M5412 Radiculopathy, cervical region: Secondary | ICD-10-CM | POA: Insufficient documentation

## 2021-07-21 DIAGNOSIS — G5641 Causalgia of right upper limb: Secondary | ICD-10-CM | POA: Insufficient documentation

## 2021-07-21 DIAGNOSIS — F411 Generalized anxiety disorder: Secondary | ICD-10-CM

## 2021-07-21 MED ORDER — FLUOXETINE 20 MG TABLET
ORAL_TABLET | ORAL | 2 refills | Status: DC
Start: 2021-07-21 — End: 2021-10-20

## 2021-07-21 MED ORDER — BUPROPION HCL XL 300 MG 24 HR TABLET, EXTENDED RELEASE
300.0000 mg | ORAL_TABLET | Freq: Every day | ORAL | 2 refills | Status: DC
Start: 2021-07-21 — End: 2021-10-20

## 2021-07-21 MED ORDER — MIRTAZAPINE 15 MG TABLET
15.0000 mg | ORAL_TABLET | Freq: Every evening | ORAL | 2 refills | Status: DC
Start: 2021-07-21 — End: 2021-10-20

## 2021-07-21 NOTE — Progress Notes (Signed)
I personally saw and evaluated the patient. See mid-level's note for additional details. My findings/participation are : overall well. Her hip pain has resolved with the hip arthrogram. Her neck symptoms are also improved. She c/o of pain in the base of the neck going up towards the skull, mainly when she is trying to sleep. Also has bilateral knee pain. No surgical indications. ESR and CRP elevated ib recent workup, she will discuss with rheumatology. FU in 6 months.    Debbora Dus, MD

## 2021-07-21 NOTE — Progress Notes (Signed)
NEUROSURGERY, PHYSICIAN OFFICE CENTER  [place] [id number]  Operated by [facility]  Progress Note    Name: [name] MRN:  [id number]   Date: 07/21/2021 Age: 65 y.o.       Referring Provider:   No referring provider defined for this encounter.          Subjective:   [name] is a 65 year old female returning to clinic for 3 month follow up after C6 SNRB.   She was last seen 04/14/21 - per note:    "Lumbar MRI had shown no significant pathology to explain her right hip/leg pain. She had a positive Patrick's test and was tender over the trochanter. She underwent a right hip differential on 03/06/21: excellent relief x 1 week. She is allergic to Mobic. She saw podiatry: foot symptoms better with metatarsal shoe lift.     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 A C6 SNRB had been ordered but denied."     She reports good results from injection. She does have neck pain in am described as sharp with radiation to left scapula with numbness and  tingling. Right arm symptoms resolved.   She reports constant lumbar pain described as sharp, worse with prolonged walking. Leaning over shopping cart helps. No leg pain/ numbness. Reports chronic overactive bladder.       Current Outpatient Medications   Medication Sig   . aspirin (ECOTRIN) 81 mg Oral Tablet, Delayed Release (E.C.) Take 81 mg by mouth Once a day   . Azelaic Acid 20 % Cream Apply topically Twice daily (Patient taking differently: Apply topically Twice per day as needed)   . buPROPion (WELLBUTRIN XL) 300 mg extended release 24 hr tablet Take 1 Tablet (300 mg total) by mouth Once a day   . cetirizine (ZYRTEC) 10 mg Oral Tablet Take 1 Tablet (10 mg total) by mouth Once a day   . ciprofloxacin HCl (CIPRO) 500 mg Oral Tablet Take 1 Tablet (500 mg total) by mouth Twice daily   . clobetasoL (TEMOVATE) 0.05 % Ointment APPLY TOPICALLY ROUTE TWICE DAILY FOR 90  DAYS   . diclofenac sodium (VOLTAREN) 1 % Gel Apply topically Four times a day - before meals and bedtime   . FLUoxetine 20 mg Oral Tablet Take 3 tablets daily (60 mg total)   . fluticasone propionate (FLONASE) 50 mcg/actuation Nasal Spray, Suspension Administer 2 Sprays into each nostril Once a day   . gabapentin (NEURONTIN) 600 mg Oral Tablet Take 1 Tablet (600 mg total) by mouth Twice daily   . Halobetasol Propionate (ULTRAVATE) 0.05 % Cream Apply topically Twice per day as needed Apply  topically. Three times a week   . ipratropium-albuterol 0.5 mg-3 mg(2.5 mg base)/3 mL Solution for Nebulization 3 mL by Nebulization route Four times a day   . loratadine (CLARITIN) 10 mg Oral Tablet Take 1 Tablet (10 mg total) by mouth Once a day   . methocarbamoL (ROBAXIN) 500 mg Oral Tablet Take 1 Tablet (500 mg total) by mouth Four times a day   . Metronidazole (METROCREAM) 0.75 % Cream Apply topically   . mineral oil-hydrophil petrolat (AQUAPHOR) Ointment Apply topically Once per day as needed   . mirtazapine (REMERON) 15 mg Oral Tablet Take 1 Tablet (15 mg total) by mouth Every night   . montelukast (SINGULAIR) 10 mg Oral Tablet Take 1 Tab (10 mg total) by mouth Once  a day   . nitroGLYCERIN (NITROSTAT) 0.4 mg Sublingual Tablet, Sublingual 1 Tab (0.4 mg total) by Sublingual route Every 5 minutes as needed for Chest pain for up to 3 doses for 3 doses over 15 minutes   . nystatin (NYSTOP) 100,000 unit/gram Powder Apply once or twice daily to PREVENT rash under breasts   . pantoprazole (PROTONIX) 40 mg Oral Tablet, Delayed Release (E.C.) Take 1 Tablet (40 mg total) by mouth Once a day   . tolterodine (DETROL) 2 mg Oral Tablet Take 2 mg by mouth Once a day   . triamcinolone acetonide (ARISTOCORT A) 0.1 % Ointment by Apply Topically route Twice daily (Patient taking differently: Apply topically Twice per day as needed)     Objective:   Vital Signs:  BP (!) 146/72   Pulse 87   Temp 36.1 C (97 F)   Ht 1.651 m (5\' 5" )   Wt 94  kg (207 lb 3.7 oz)   BMI 34.49 kg/m       Constitutional  General appearance: In no acute distress   HEENT:  Heent:  normocephalic   Musculoskeletal  Gait and Station: : Not done , seated during exam   Muscle strength (upper extremities): : Normal  Muscle strength (lower extremities): : Normal  Muscle tone (upper extremities): : Normal  Muscle tone (lower extremities): : Normal  Sensation: Normal  Deep tendon reflexes upper and lower extremities: Normal  Hoffman's reflex: Left: negative Right: negative  Musculoskeletal tenderness: positive    Neurological  Orientation: Normal  Recent and remote memory: Normal  Attention span and concentration: Normal  Language: Normal  Fund of knowledge: Normal    Data reviewed  05/19/21 CT cervical [place] PACS:  DISCUSSION: Prior to the procedure, the patient reported pain in the right posterior lateral neck and the right occiput with intensity of 7 of 10. Following the procedure, the patient reported relief of pain with intensity of 0 of 10.    IMPRESSION:  Right C6 transforaminal nerve block.    Discussions with other providers:   Reviewed chart notes     Assessment:    1. Cervical spondylosis    2. Arthralgia, unspecified joint    3. Cervical radiculopathy    4. Lumbar pain with radiation down right leg    5. Cervical neck pain with evidence of disc disease    6. Neck pain    7. 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 Carpal tunnel syndrome of right wrist    9. Swelling of right side of face    10. Right hip pain        Recommendations:  [name] is a 65 year old female returning to clinic for 3 month follow up after C6 SNRB.   She was last seen 04/14/21:    -Return to clinic in 6 months for clinical follow up     Cheral Marker, APRN,FNP-BC  As a shared visit with Dr. Margart Sickles
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
## 2021-07-22 ENCOUNTER — Ambulatory Visit
Admission: RE | Admit: 2021-07-22 | Discharge: 2021-07-22 | Disposition: A | Discharge status: Home / Self Care | Payer: MEDICAID | Source: Ambulatory Visit | Attending: Internal Medicine | Admitting: Internal Medicine

## 2021-07-22 DIAGNOSIS — M2550 Pain in unspecified joint: Secondary | ICD-10-CM | POA: Insufficient documentation

## 2021-07-24 ENCOUNTER — Encounter (HOSPITAL_BASED_OUTPATIENT_CLINIC_OR_DEPARTMENT_OTHER): Payer: Self-pay | Admitting: Student in an Organized Health Care Education/Training Program

## 2021-07-24 ENCOUNTER — Ambulatory Visit
Payer: MEDICAID | Attending: Student in an Organized Health Care Education/Training Program | Admitting: Student in an Organized Health Care Education/Training Program

## 2021-07-24 ENCOUNTER — Other Ambulatory Visit: Payer: Self-pay

## 2021-07-24 VITALS — Temp 97.7°F | Ht 66.38 in | Wt 207.9 lb

## 2021-07-24 DIAGNOSIS — M79641 Pain in right hand: Secondary | ICD-10-CM | POA: Insufficient documentation

## 2021-07-24 DIAGNOSIS — Z6833 Body mass index (BMI) 33.0-33.9, adult: Secondary | ICD-10-CM

## 2021-07-24 DIAGNOSIS — M19049 Primary osteoarthritis, unspecified hand: Secondary | ICD-10-CM

## 2021-07-24 NOTE — Progress Notes (Signed)
[facility]  [address] [place], [place] [address]       Patient: [name]  DOB: 1956-02-06  MRN: [id number]    Date: 07/24/2021      CHIEF COMPLAINT:  follow up of right elbow and hand pain      HISTORY OF PRESENT ILLNESS:  [name] is a 65 yo female who presents for follow up of right elbow and hand pain. Asked patient how her symptoms were doing and she stated "they are doing great."     Per chart review, Dr.Bhatia performed a right C6 transforaminal nerve block due to C5-C6 moderate to severe neural foraminal stenosis that completely took away the patients elbow and hand pain after the injection.She follows up with Neurology in 6 months to see if they are going to do surgery or not for her degenerative disc disease, stenosis C5-6, C6-7 and foraminal stenosis as mentioned above. She does wear the brace we ordered for her last visit stating that when it rains it causes her hand and wrist to ache, but when she applies the brace to her wrist for 3 days it takes away the pain for weeks due to the compression it provides and keeping her wrist straightened. Per patient, she did see rheumatology 2 days ago where they took xrays of her hands stating she has bad arthritis at her DIP joints. She does apply Voltaren gel and it helps her arthritic pain in her hands as well as her back and knees. Denies numbness and tingling today.     PHYSICAL EXAM:  Awake, alert, no acute distress  Temp 36.5 C (97.7 F)   Ht 1.686 m (5' 6.38")   Wt 94.3 kg (207 lb 14.3 oz)   BMI 33.17 kg/m   Symmetric nonlabored breathing, no acute respiratory distress  Exposed skin intact w/ no rashes or lesions  RUE muscle tightness in her shoulder up through her neck, able to flex and extend wrist, no tenderness  Sensation intact to light touch grossly over base of dorsum of thumb, as well as volar tips of IF/SF  Motor intact - can give thumbs up, cross IF/MF, and make an O w/ IF and  thumb  Warm and well perfused digits      IMAGING  no images    ASSESSMENT/PLAN:   [name] is a 65 yo female with improved right elbow and hand pain secondary to a right C6 transforaminal nerve block due to C5-C6 moderate to severe neural foraminal stenosis.    1. Discussed with patient that we are happy that her neurologist was able to find out what were causing her symptoms.   2. She is always welcome to come back to our clinic if her arthritis worsens and she would like surgical intervention. For now, she can follow up as needed.   3. Continue applying Voltaren gel as needed for her arthritis.   4. Continue wearing right wrist cock up splint as needed.  5. Patient was given the opportunity to ask questions and all questions were answered.    Follow up as needed    Patient was seen and evaluated in clinic today with the cosigning physician.     Court Joy, PA-C  07/24/2021, 09:14    I personally interviewed and examined this patient. The note was documented by Court Joy PA-C who saw and examined the patient with me.  My findings are consistent with those documented in the note above. I made  changes to the above note where appropriate. Patient is doing well and will F/U PRN.     Frutoso Chase, MD  [facility] Orthopaedic Surgery

## 2021-07-27 ENCOUNTER — Encounter (INDEPENDENT_AMBULATORY_CARE_PROVIDER_SITE_OTHER): Payer: Self-pay | Admitting: Urology

## 2021-07-27 ENCOUNTER — Other Ambulatory Visit: Payer: Self-pay

## 2021-07-27 ENCOUNTER — Ambulatory Visit: Payer: MEDICAID | Attending: Urology | Admitting: Urology

## 2021-07-27 VITALS — BP 128/82 | HR 95 | Temp 97.0°F | Ht 66.38 in | Wt 206.8 lb

## 2021-07-27 DIAGNOSIS — M19041 Primary osteoarthritis, right hand: Secondary | ICD-10-CM

## 2021-07-27 DIAGNOSIS — I10 Essential (primary) hypertension: Secondary | ICD-10-CM

## 2021-07-27 DIAGNOSIS — R300 Dysuria: Secondary | ICD-10-CM | POA: Insufficient documentation

## 2021-07-27 DIAGNOSIS — Z6833 Body mass index (BMI) 33.0-33.9, adult: Secondary | ICD-10-CM

## 2021-07-27 DIAGNOSIS — M19042 Primary osteoarthritis, left hand: Secondary | ICD-10-CM

## 2021-07-27 DIAGNOSIS — Z87448 Personal history of other diseases of urinary system: Secondary | ICD-10-CM

## 2021-07-27 NOTE — Progress Notes (Signed)
NAME: [name]  AGE: 65 y.o.  DOB: 1956/05/22  SERVICE: Urology        Chief Complaint   Patient presents with   . Pain on Urination       S: This is a 65 y.o. female here for follow up of lower urinary tract symptoms and dysuria.  She was previously on Detrol for her voiding symptoms but has been off this due to development of lower extremity edema.  She was last here about 6 months ago when she was complaining of intermittent dysuria.  She was recommended to take Cipro 3-5 days p.r.n. symptoms.  She is here today for routine follow-up.  She has only had 1 flare-up of her symptoms that occurred a couple weeks ago.  The symptoms resolved with 3 days of Cipro.  She is currently asymptomatic.     Past Medical History  Past Medical History:   Diagnosis Date   . Anxiety    . Arthritis    . Cancer (CMS HCC)     internal skin cancer   . Chronic pain     lower back   . Depression    . Dyspnea on exertion    . Essential hypertension 02/16/2017    patient denies   . GERD (gastroesophageal reflux disease)     controlled w/med   . Headache    . Heart murmur 1979    benign   . Hyperlipidemia LDL goal < 100 08/29/2013    denies   . MINOR CAD (coronary artery disease) 11/10/2012    no treatment other than cholesterol medications; follows regularly with Dr. Rodney Booze   . Multinodular goiter    . Muscle weakness     neck    . Obesity    . Peripheral edema    . Rash     face only eczema   . Rheumatic fever 16    no complications   . S/P thyroidectomy    . Squamous cell carcinoma 02/06/2015    right side of neck   . 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 Vaginal prolapse 2012    surgery improved significantly   . Wears glasses            Past Surgical History  Past Surgical History:   Procedure Laterality Date   . COLONOSCOPY  01/06/09    COLONOSCOPY performed by Loletta Parish F at [facility]   . GASTROSCOPY  03/05/2010    GASTROSCOPY performed by Jocelyn Lamer at [facility] [place]   . GASTROSCOPY WITH BIOPSY  03/05/2010     GASTROSCOPY WITH BIOPSY performed by Jocelyn Lamer at [facility]   . HX ADENOIDECTOMY     . HX ANKLE FRACTURE [place]  2007    left distal fibula, casted   . HX APPENDECTOMY     . HX CHOLECYSTECTOMY     . HX COLONOSCOPY     . HX CYSTOCELE REPAIR  09/24/09   . HX HAND SURGERY  2012    for Carpal tunnel : right side treated   . HX HEART CATHETERIZATION     . HX OOPHORECTOMY      left ovary removed   . HX PAROTIDECTOMY       "surgical clamp"    . HX PARTIAL THYROIDECTOMY Right 04/05/2019    R hemithyroidectomy for nodule, final path follicular adenoma   . HX TONSILLECTOMY     . HX TOTAL VAGINAL HYSTERECTOMY  1979   .  HX WRIST FRACTURE [place]  2007    [place] injury   . PB REVISE ULNAR NERVE AT ELBOW Left 1979   . PB UPPER GI ENDOSCOPY,BIOPSY  12/19/07    patulous GE junction zone, erythema, nonerosive GERD       Allergies:  Allergies   Allergen Reactions   . Cymbalta [Duloxetine]      Rash on feet and hands   . Blue Dye      Told to avoid due to patch test result by Simone Curia   . Flavoring Agent  Other Adverse Reaction (Add comment)     Patient unsure of reaction   . Lipitor [Atorvastatin] Myalgia     Muscle pain   . Nickel      Blisters  "Allergic to all kinds of metal"    . Codeine Itching     RASH   . Mobic [Meloxicam] Nausea/ Vomiting     Nervous and shakes    . Naprosyn [Naproxen] Nausea/ Vomiting   . Nsaids (Non-Steroidal Anti-Inflammatory Drug) Nausea/ Vomiting       Meds:  aspirin (ECOTRIN) 81 mg Oral Tablet, Delayed Release (E.C.), Take 81 mg by mouth Once a day  Azelaic Acid 20 % Cream, Apply topically Twice daily (Patient taking differently: Apply topically Twice per day as needed)  buPROPion (WELLBUTRIN XL) 300 mg extended release 24 hr tablet, Take 1 Tablet (300 mg total) by mouth Once a day  cetirizine (ZYRTEC) 10 mg Oral Tablet, Take 1 Tablet (10 mg total) by mouth Once a day  clobetasoL (TEMOVATE) 0.05 % Ointment, APPLY TOPICALLY ROUTE TWICE DAILY FOR 90 DAYS  diclofenac sodium (VOLTAREN) 1 % Gel, Apply topically  Four times a day - before meals and bedtime  FLUoxetine 20 mg Oral Tablet, Take 3 tablets daily (60 mg total)  fluticasone propionate (FLONASE) 50 mcg/actuation Nasal Spray, Suspension, Administer 2 Sprays into each nostril Once a day  gabapentin (NEURONTIN) 600 mg Oral Tablet, Take 1 Tablet (600 mg total) by mouth Twice daily  Halobetasol Propionate (ULTRAVATE) 0.05 % Cream, Apply topically Twice per day as needed Apply  topically. Three times a week  ipratropium-albuterol 0.5 mg-3 mg(2.5 mg base)/3 mL Solution for Nebulization, 3 mL by Nebulization route Four times a day  loratadine (CLARITIN) 10 mg Oral Tablet, Take 1 Tablet (10 mg total) by mouth Once a day  methocarbamoL (ROBAXIN) 500 mg Oral Tablet, Take 1 Tablet (500 mg total) by mouth Four times a day  mineral oil-hydrophil petrolat (AQUAPHOR) Ointment, Apply topically Once per day as needed  mirtazapine (REMERON) 15 mg Oral Tablet, Take 1 Tablet (15 mg total) by mouth Every night  montelukast (SINGULAIR) 10 mg Oral Tablet, Take 1 Tab (10 mg total) by mouth Once a day  nitroGLYCERIN (NITROSTAT) 0.4 mg Sublingual Tablet, Sublingual, 1 Tab (0.4 mg total) by Sublingual route Every 5 minutes as needed for Chest pain for up to 3 doses for 3 doses over 15 minutes  nystatin (NYSTOP) 100,000 unit/gram Powder, Apply once or twice daily to PREVENT rash under breasts  pantoprazole (PROTONIX) 40 mg Oral Tablet, Delayed Release (E.C.), Take 1 Tablet (40 mg total) by mouth Once a day  tolterodine (DETROL) 2 mg Oral Tablet, Take 2 mg by mouth Once a day  triamcinolone acetonide (ARISTOCORT A) 0.1 % Ointment, by Apply Topically route Twice daily (Patient taking differently: Apply topically Twice per day as needed)    No facility-administered medications prior to visit.       ROS:  Please see HPI.  All other systems were reviewed by me and are negative.    O:           PHYSICAL EXAM:    BP 128/82   Pulse 95   Temp 36.1 C (97 F)   Ht 1.686 m (5' 6.38")   Wt 93.8 kg (206  lb 12.7 oz)   SpO2 98%   BMI 33.00 kg/m       General:  NAD.  Vital signs reviewed.  Skin:  Warm, dry, and without suspicious lesion.    HEENT:  Head is normocephalic.  Hearing adequate for conversation.  Pulmonary:  Respirations unlabored.  No audible wheeze.     Cardiovascular:  No JVD.   MS:  Ambulates without difficulty.  No CVAT.  GI: The abdomen is soft, nontender and nondistended.       Urine::   Urine Dip Results:         Labs:   07/14/2021-creatinine 0.94   07/14/2021-WBC 6.3, hemoglobin 13.8, platelet 405      A:    1. Dysuria  2. History of lower urinary tract symptoms  3. Hypertension       P:  She will continue to treat with a few days of Cipro p.r.n. symptoms.  We will see her back in 1 year.  She will stay off tolterodine.    No orders of the defined types were placed in this encounter.      Dossie Arbour, APRN,FNP-BC  07/27/2021, 11:32     Dossie Arbour, [facility]  Department of Urology  [phone]  Fax- [phone]

## 2021-07-27 NOTE — Addendum Note (Signed)
Addended by: Dutch Quint on: 07/27/2021 12:26 PM     Modules accepted: Orders

## 2021-08-04 ENCOUNTER — Ambulatory Visit (HOSPITAL_BASED_OUTPATIENT_CLINIC_OR_DEPARTMENT_OTHER): Payer: Self-pay | Admitting: Physician Assistant

## 2021-08-04 ENCOUNTER — Other Ambulatory Visit (HOSPITAL_BASED_OUTPATIENT_CLINIC_OR_DEPARTMENT_OTHER): Payer: Self-pay | Admitting: Family Medicine

## 2021-08-04 DIAGNOSIS — M130 Polyarthritis, unspecified: Secondary | ICD-10-CM

## 2021-08-04 NOTE — Telephone Encounter (Signed)
Duplicate request.  Riccardo Holeman, RN

## 2021-08-04 NOTE — Telephone Encounter (Signed)
Regarding: Dalton Pt// rx refill  ----- Message from Jama Flavors sent at 08/04/2021 12:54 PM EDT -----  Alvis Lemmings, PA-C    Pharmacy called in requesting a refill for the following Rx:                          diclofenac sodium (VOLTAREN) 1 % Gel, Apply topically Four times a day - before meals and bedtime           Preferred Pharmacy     [id number] [address], [place] [address]    Phone: [phone] Fax: [phone]    Hours: Not open 24 hours        Thanks,  Ramona K Postelwait

## 2021-08-20 ENCOUNTER — Other Ambulatory Visit: Payer: Self-pay

## 2021-08-20 ENCOUNTER — Encounter (INDEPENDENT_AMBULATORY_CARE_PROVIDER_SITE_OTHER): Payer: Self-pay | Admitting: Student in an Organized Health Care Education/Training Program

## 2021-08-20 ENCOUNTER — Ambulatory Visit: Payer: MEDICAID | Attending: Neurology | Admitting: Student in an Organized Health Care Education/Training Program

## 2021-08-20 ENCOUNTER — Encounter (INDEPENDENT_AMBULATORY_CARE_PROVIDER_SITE_OTHER): Payer: Self-pay | Admitting: Neurology

## 2021-08-20 VITALS — BP 149/78 | HR 94 | Ht 65.0 in | Wt 211.0 lb

## 2021-08-20 DIAGNOSIS — Z6835 Body mass index (BMI) 35.0-35.9, adult: Secondary | ICD-10-CM

## 2021-08-20 DIAGNOSIS — M542 Cervicalgia: Secondary | ICD-10-CM | POA: Insufficient documentation

## 2021-08-20 DIAGNOSIS — Z9889 Other specified postprocedural states: Secondary | ICD-10-CM

## 2021-08-20 NOTE — Progress Notes (Signed)
[facility] [facility]  by [facility]  Return Outpatient Note      Date:  08/20/2021  Patient Name: [name]  MRN: [id number]  Age:  65 y.o.  Referring Physician:   Drema Balzarine, MD  [place],  [place] [address]    CC: Neurologic Problem (1 year follow up)      History obtained from: patient and review of medical record    HPI  65 y.o.female with PMH of mood disorder, depression, anxiety, peripheral neuropathy, CAD, allergic rhinitis who presents to [facility] neurology clinic for 1 year follow up of he neck pain and RUE shooting pains    Interval history  Patient reports that she received a right cervical nerve block with radiology on 05/19/21. Patient states her neck is able to move a lot more now after her nerve block. Patient reports feeling much better at this point. Patient currently uses Voltaren gel that seems to help her significantly.       Historically  [name] is a 65 year old right-handed Caucasian woman who comes to return visit at the neurology clinic. 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      Today she admits to recent swelling in the right submandibular region of her neck after a right hemithyroidectomy on April 05 2019 for a nodule on the right thyroid lobe. She also has shooting pain when turning her head to the right or left. 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 She has occasional pain in her right 5th digit and right 5th toe that improves with activity. She also mentioned having intermittent headaches that do not appear to be getting worse.      Pt mentioned that Psychiatry started her on Wellbutrin qAM last week.      There was no plexopathy, radiculopathy noted on the EMG nerve  conduction study from July 2019.  There was no evidence of median neuropathy or at the right wrist or right ulna neuropathy.       PAST MEDICAL HISTORY:  Depression.  Benign Thyroid Nodule  Sacroiliitis      PAST SURGICAL HISTORY:  Significant for left ulnar nerve repair surgery and surgery done for right parotid gland, hysterectomy, cholecystectomy, appendectomy,partial thyroid resection.    PHYSICAL EXAM  BP (!) 149/78   Pulse 94   Ht 1.651 m (5\' 5" )   Wt 95.7 kg (210 lb 15.7 oz)   BMI 35.11 kg/m       Appearance: no acute distress  Ophthalmoscopic: disc appears flat, normal fundus, no hemorrhages or papilledema (however un-dilated examination)  Carotid/Heart/Peripheral Vascular: no peripheral edema  Orientation: oriented to person, place and time  Mental status:   Memory: good based on conversation with the patient   Attention: normal   Knowledge: appropriate   Language: good fluency, comprehension and repetition   Speech: no dysarthria  Cranial Nerves:   2: visual fields grossly intact   3,4,6: EOMI, PERRL, no gaze deviation or restriction, no nystagmus   5: facial sensation intact   7: no facial asymmetry   8: hearing grossly intact   9,10: palate symmetric   11: good shoulder shrug   12: tongue appears midline with  no deviation  Gait: stable, good stride, symmetric arm swing  Coordination: no ataxia or dysmetria with finger to nose testing  Movement: no resting tremor, no action tremor, able to perform RAM without difficulty in BUE, no abnormal involuntary movements noted during examination  Sensory: intact and symmetric to pinprick, light touch, and vibration  Muscle Tone: WNL  Muscle exam:  Arm Right Left Leg Right Left   Deltoid 5/5 5/5 Iliopsoas 5/5 5/5   Biceps 5/5 5/5 Quads 5/5 5/5   Triceps 5/5 5/5 Hamstrings 5/5 5/5   Wrist Extension 5/5 5/5 Ankle Dorsi Flexion 5/5 5/5   Wrist Flexion 5/5 5/5 Ankle Plantar Flexion 5/5 5/5   Interossei 5/5 5/5 Ankle Eversion 5/5 5/5   APB 5/5 5/5 Ankle Inversion  5/5 5/5     Reflexes:   RJ BJ TJ KJ AJ Plantars Hoffman's   Right 2+ 2+ 2+ 2+ 2+ Downgoing Not present   Left 2+ 2+ 2+ 2+ 2+ Downgoing Not present       Personal review of Diabetes Monitors  A1C - Glucose - Lipids Microalbumin   Results in Last 18 Months   Lab Test 04/28/21  1136   CHOLESTEROL 228*   HDLCHOL 36*   LDLCHOL 139*   TRIG 267*    No results for input(s): MICALBRNUR, MICALBCRERAT in the last 13140 hours.     Diabetic foot exam:  Both feet without edema or ulcerations. Pulses normal bilaterally. Sensation normal bilaterally                       IMAGING & REPORTS  04/28/21 CT Soft Tissue Neck W Contrast   IMPRESSION:  There is a right parotid resection and right hemithyroidectomy are present. No acute abnormality is identified. No neck mass is identified.    02/23/21 MRI LS -spine WO contrast  IMPRESSION:  Mild lumbar spondylosis without any spinal or foraminal stenosis.    01/07/21 CT C-spine WO contrast  IMPRESSION:  1. Multilevel facet and uncovertebral degenerative changes resulting in neural foraminal stenosis at multiple levels. The most severe level is the C5-6 level with there is severe right-sided neural foraminal stenosis.    12/22/20 MRI C-spine WO contrast  IMPRESSION:  Degenerative spondylosis of the cervical spine, as above. There is up to mild spinal canal stenosis at C5-6 and C6-7. Moderate to severe neural foraminal stenosis on the right at C5-6.    Independent Review of Images and Reports    ASSESSMENT & PLAN    ICD-10-CM    1. Neck pain  M54.2         No orders of the defined types were placed in this encounter.    [name] is a 65 year old right-handed Caucasian woman with a significant history of parotid gland tumor s/p surgery done and history of depression.  She comes for follow up visit for pain in the right forearm  associated with swelling. MRI imaging of the cervical spine, which was done  which showed disk bulge at C5-C6-C6-C7 indenting the thecal sac without significant  cord impingement. Patient has been following with sports medicine and neurosurgery for this issue as well. Patient recently have a cervical nerve block completed on 05/19/21 which provided significant relief. Patient reports her neck pain has significantly improved as well and the Voltaren cream helps as well greatly. Informed patient to continue her follow ups with Neurosurgery and sports medicine.     RTC PRN    Teofilo Pod, MD  08/20/2021    I saw  the patient.  I reviewed the resident's note.  I agree with the findings and plan of care as documented in the resident's note.  Any exceptions/additions are edited/noted.    Virgina [place], MD

## 2021-08-26 ENCOUNTER — Other Ambulatory Visit (HOSPITAL_BASED_OUTPATIENT_CLINIC_OR_DEPARTMENT_OTHER): Payer: Self-pay | Admitting: Physician Assistant

## 2021-09-23 ENCOUNTER — Ambulatory Visit (INDEPENDENT_AMBULATORY_CARE_PROVIDER_SITE_OTHER): Payer: Self-pay | Admitting: Optometrist

## 2021-09-23 NOTE — Telephone Encounter (Signed)
Regarding: [name]  ----- Message from Namon Cirri sent at 09/23/2021 11:52 AM EST -----  Patient having issues with blurred vision and has a bump under her left eye.    She would like to be seen as soon as possible.  [phone]

## 2021-09-23 NOTE — Telephone Encounter (Signed)
Spoke to pt. Pt stated that she has been having bilateral blurry vision for about 2 weeks. Pt also has a knot under her left eye that is painful when she pushes on it. Scheduled pt in open slot for 09/24/2021 at 9:15 AM with Dr. Lucia Gaskins. Garey Ham, RN  09/23/2021, 12:46

## 2021-09-24 ENCOUNTER — Ambulatory Visit: Payer: MEDICAID | Attending: Optometrist | Admitting: Optometrist

## 2021-09-24 ENCOUNTER — Encounter (INDEPENDENT_AMBULATORY_CARE_PROVIDER_SITE_OTHER): Payer: Self-pay | Admitting: Optometrist

## 2021-09-24 ENCOUNTER — Inpatient Hospital Stay (HOSPITAL_BASED_OUTPATIENT_CLINIC_OR_DEPARTMENT_OTHER)
Admission: RE | Admit: 2021-09-24 | Discharge: 2021-09-24 | Disposition: A | Discharge status: Home / Self Care | Payer: MEDICAID | Source: Ambulatory Visit

## 2021-09-24 ENCOUNTER — Other Ambulatory Visit: Payer: Self-pay

## 2021-09-24 DIAGNOSIS — H35341 Macular cyst, hole, or pseudohole, right eye: Secondary | ICD-10-CM

## 2021-09-24 DIAGNOSIS — H538 Other visual disturbances: Secondary | ICD-10-CM

## 2021-09-24 DIAGNOSIS — H2513 Age-related nuclear cataract, bilateral: Secondary | ICD-10-CM | POA: Insufficient documentation

## 2021-09-24 DIAGNOSIS — H0015 Chalazion left lower eyelid: Secondary | ICD-10-CM | POA: Insufficient documentation

## 2021-09-24 NOTE — Progress Notes (Addendum)
[facility]  [place] [id number]  Operated by [place]         Patient Name: [name]  MRN#: [id number]  [place]: Mar 11, 1956    Date of Service: 09/24/2021    Chief Complaint    Cataract         Collyn Selk is a 65 y.o. female who presents today for evaluation/consultation of:  HPI    1 year follow up cataract OU patient states her vision is blurry. She has a bump on left lower eye lid that hurst when she presses on it. Seems to be getting bigger.   Art tears PRN   Last edited by Fuller Canada E, COMT on 09/24/2021  9:00 AM.        ROS    Positive for: Eyes  Negative for: Constitutional, Gastrointestinal, Neurological, Skin, Genitourinary, Musculoskeletal, HENT, Endocrine, Cardiovascular, Respiratory, Psychiatric, Allergic/Imm, Heme/Lymph  Last edited by Delfin Gant, COMT on 09/24/2021  9:00 AM.         All other systems Negative    Erin E Wotring, COMT  09/24/2021, 09:01    OD Addition to HPI: Patient has a bump on her lower left eyelid. It is sore to touch. She has been using warm compresses. She has had it for a month or two. Her right eye is blurry x couple weeks.           ENCOUNTER DIAGNOSES     ICD-10-CM   1. Macular hole of right eye  H35.341   2. Chalazion of left lower eyelid  H00.15   3. 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      Ophthalmic Plan of Care:    Macular hole OD  VA: 20/80  -mac OCT 09/24/2021: full thickness mac hole OD, normal OS  -2 weeks duration?  -referral to surgical retina     Chalazion OS LL  -2 months duration  -continue warm compresses with massage  -patient would like to be seen by oculoplastics     NS OU  -Patient educated on today's findings  -mild cataracts, pre-surgical at this time  -Monitor again in 1 year       Follow up:    I have asked Wadie Mattie to follow up with retina within 2 weeks and oculoplastics next  available          I have seen and examined the above patient. I discussed the above diagnoses listed in the assessment and the above ophthalmic plan of care with the patient and patient's family. All questions were answered. I reviewed and, when necessary, made changes to the technician/resident note, documented ophthalmology exam, chief complaint, history of present illness, allergies, review of systems, past medical, past surgical, family and social history. I personally reviewed and interpreted all testing and/or imaging performed at this visit and agree with the resident's or fellow's interpretation. Any exceptions/additions are edited/noted in the relevant encounter fields.      Morton Stall, OD  09/24/2021, 09:10    Base Eye Exam     Visual Acuity (Snellen - Linear)       Right Left    Dist cc 20/80 20/30          Tonometry (Tonopen, 10:15 AM)       Right Left    Pressure 13 13  Pupils       Pupils    Right PERRL    Left PERRL          Visual Fields (Counting fingers)       Right Left     Full Full          Extraocular Movement       Right Left     Full Full          Neuro/Psych     Oriented x3: Yes    Mood/Affect: Normal          Dilation     Both eyes: 1.0% Mydriacyl @ 10:16 AM            Slit Lamp and Fundus Exam     External Exam       Right Left    External Normal Normal          Slit Lamp Exam       Right Left    Lids/Lashes Normal chalazion LL    Conjunctiva/Sclera White and quiet White and quiet    Cornea Clear Clear    Anterior Chamber Deep and quiet Deep and quiet    Iris Round and reactive Round and reactive    Lens trace NS trace NS          Fundus Exam       Right Left    Vitreous Normal Normal    Disc Normal Normal    C/D Ratio 0.35 0.3    Macula mac hole Normal    Vessels Normal Normal    Periphery Normal Normal            Refraction     Wearing Rx       Sphere Cylinder Axis Add    Right +2.25 +1.75 173 +2.50    Left +1.50 +0.50 013 +2.50          Manifest Refraction (Auto)        Sphere Cylinder Axis    Right +1.75 +1.25 011    Left +1.50 +0.75 178

## 2021-10-07 ENCOUNTER — Inpatient Hospital Stay (HOSPITAL_BASED_OUTPATIENT_CLINIC_OR_DEPARTMENT_OTHER)
Admission: RE | Admit: 2021-10-07 | Discharge: 2021-10-07 | Disposition: A | Discharge status: Home / Self Care | Payer: MEDICAID | Source: Ambulatory Visit

## 2021-10-07 ENCOUNTER — Ambulatory Visit: Payer: MEDICAID | Attending: Ophthalmology | Admitting: Ophthalmology

## 2021-10-07 ENCOUNTER — Other Ambulatory Visit: Payer: Self-pay

## 2021-10-07 ENCOUNTER — Encounter (INDEPENDENT_AMBULATORY_CARE_PROVIDER_SITE_OTHER): Payer: Self-pay | Admitting: Ophthalmology

## 2021-10-07 DIAGNOSIS — H35341 Macular cyst, hole, or pseudohole, right eye: Secondary | ICD-10-CM | POA: Insufficient documentation

## 2021-10-07 DIAGNOSIS — H2513 Age-related nuclear cataract, bilateral: Secondary | ICD-10-CM

## 2021-10-07 NOTE — Progress Notes (Addendum)
[facility]  [place] [address]  Operated by [facility]         Patient Name: [name]  MRN#: [id number]  [place]: October 08, 1956    Date of Service: 10/07/2021    Chief Complaint    Blurred Vision         [name] is a 65 y.o. female who presents today for evaluation/consultation of:  HPI     Blurred Vision    In right eye.  Onset was gradual.  Vision is blurred.  Severity is moderate (Pt states sometimes severe).  This started 2 months ago.  Occurring constantly.  Context:  distance vision, mid-range vision and near vision.  Since onset it is gradually worsening.  Associated symptoms include eye pain (corner of right eye nasally constantly) and tearing.  Negative for glare, haloes, double vision, a need for brighter lights, dryness, redness, trauma, pain with eye movement, abnormal color vision, headache, scalp tenderness, jaw claudication, shoulder/hip pain, fever, weight loss and fatigue.  Treatments tried include eye drops and glasses (Pt states using clear eyes regularly).  Response to treatment was significant improvement. Additional comments: Referred by Dr Lucia Gaskins          Last edited by Crist Fat, [place] on 10/07/2021  7:31 AM.        ROS    Positive for: Eyes  Negative for: Constitutional, Gastrointestinal, Neurological, Skin, Genitourinary, Musculoskeletal, HENT, Endocrine, Cardiovascular, Respiratory, Psychiatric, Allergic/Imm, Heme/Lymph  Last edited by Crist Fat, [place] on 10/07/2021  7:31 AM.         All other systems Negative    Crist Fat, COA  10/07/2021, 07:33        Base Eye Exam     Visual Acuity (Snellen - Linear)       Right Left    Dist cc 20/400 +1 20/30 -1    Dist ph cc NI     Correction: Glasses          Tonometry (Tonopen, 7:41 AM)       Right Left    Pressure 13 14          Pupils       APD    Right None    Left None          Visual Fields       Right Left     Full Full   Pt states black dot covering tech  eye           Extraocular Movement       Right Left     Full Full          Neuro/Psych     Oriented x3: Yes    Mood/Affect: Normal          Dilation     Both eyes: 1.0% Mydriacyl, 2.5% Phenylephrine @ 7:41 AM            Slit Lamp and Fundus Exam     External Exam       Right Left    External Normal Normal          Slit Lamp Exam       Right Left    Lids/Lashes Normal chalazion LL    Conjunctiva/Sclera White and quiet White and quiet    Cornea Clear Clear    Anterior Chamber Deep and quiet Deep and quiet    Iris Round and reactive Round and reactive  Lens trace NS trace NS          Fundus Exam       Right Left    Posterior Vitreous Normal Normal    Disc Normal Normal    C/D Ratio 0.35 0.3    Macula FTMH Normal    Vessels Normal Normal    Periphery Normal Normal            Refraction     Wearing Rx       Sphere Cylinder Axis Add    Right +2.25 +1.75 173 +2.50    Left +1.50 +0.50 013 +2.50                MD Addition to HPI: Pt presents today referred by Dr Lucia Gaskins for a macular hole in the right eye. Pt reports gradual onset constant decreased vision in the right eye that began 2 months ago.           ENCOUNTER DIAGNOSES     ICD-10-CM   1. Full thickness macular hole, right  H35.341   2. Nuclear sclerotic cataract of both eyes  H25.13     Orders Placed This Encounter   Procedures   . OPH OCT BI   . OPH FUNDUS PHOTOS       Ophthalmic Plan of Care:    [place] OD   - VA 20/400 today on presentatio, longstanding ~2 months   - discussed R/B/A of [place] repair, including the risk of RD and risk of failure to close requiring additional surgeries as well as required positioning, also reviewed guarded visual prognosis   - pt expresses understanding of the above and wishes to proceed   - will schedule PPV/FTMH repair/Gas    Follow up:    I have asked Darria Corvera to follow up post-op           I have seen and examined the above patient. I discussed the above diagnoses listed in the assessment and the above ophthalmic plan of  care with the patient and patient's family. All questions were answered. I reviewed and, when necessary, made changes to the technician/resident note, documented ophthalmology exam, chief complaint, history of present illness, allergies, review of systems, past medical, past surgical, family and social history. I personally reviewed and interpreted all testing and/or imaging performed at this visit and agree with the resident's or fellow's interpretation. Any exceptions/additions are edited/noted in the relevant encounter fields.      I am scribing for, and in the presence of Dr. Gala Murdoch for services provided on 10/07/2021.  Loman [place], SCRIBE   [place], [place]  10/07/2021, 07:44      SURGERY    Procedure: RIGHT 25 G PPV/FTMH repair/Gas    Diagnosis:     ICD-10-CM    1. Full thickness macular hole, right  H35.341 OPH OCT BI     OPH FUNDUS PHOTOS      2. Nuclear sclerotic cataract of both eyes  H25.13           Anesthesia: MAC RETROBULBAR    Length of time: 90 minutes    Special Needs: 11/02/21, ICG     Risks, benefits, and alternatives explained.    Pt understands and elects to proceed with surgery/desires for surgery  When a scribe documentaion is present , I attest that I personally performed the services described in this documentation, as scribed  in my presence, and it is both accurate  and complete.  When a resident  or fellow documentation is present, I attest that I saw and examined the patient.  I reviewed the resident's note.  I agree with the findings and plan of care as documented in the resident's note.  Any exceptions/additions are edited/noted.  When performed, I also attest that I reviewed the image(s)  and testing(s)  and agree with the interpretation and report as documented by the resident/fellow.  Exceptions as noted.    Con Memos, MD

## 2021-10-13 ENCOUNTER — Ambulatory Visit (HOSPITAL_BASED_OUTPATIENT_CLINIC_OR_DEPARTMENT_OTHER): Payer: Self-pay | Admitting: Student in an Organized Health Care Education/Training Program

## 2021-10-14 ENCOUNTER — Inpatient Hospital Stay (HOSPITAL_COMMUNITY)
Admission: RE | Admit: 2021-10-14 | Discharge: 2021-10-14 | Disposition: A | Discharge status: Home / Self Care | Payer: MEDICAID | Source: Ambulatory Visit

## 2021-10-14 ENCOUNTER — Encounter (INDEPENDENT_AMBULATORY_CARE_PROVIDER_SITE_OTHER): Payer: Self-pay | Admitting: Ophthalmology

## 2021-10-14 ENCOUNTER — Ambulatory Visit: Payer: MEDICAID | Attending: Ophthalmology | Admitting: Ophthalmology

## 2021-10-14 ENCOUNTER — Ambulatory Visit (HOSPITAL_BASED_OUTPATIENT_CLINIC_OR_DEPARTMENT_OTHER): Payer: MEDICAID | Admitting: Physician Assistant

## 2021-10-14 ENCOUNTER — Other Ambulatory Visit: Payer: Self-pay

## 2021-10-14 VITALS — BP 118/64 | HR 92 | Temp 97.0°F | Ht 65.0 in | Wt 211.2 lb

## 2021-10-14 DIAGNOSIS — H0015 Chalazion left lower eyelid: Secondary | ICD-10-CM

## 2021-10-14 DIAGNOSIS — H01009 Unspecified blepharitis unspecified eye, unspecified eyelid: Secondary | ICD-10-CM | POA: Insufficient documentation

## 2021-10-14 DIAGNOSIS — I10 Essential (primary) hypertension: Secondary | ICD-10-CM

## 2021-10-14 DIAGNOSIS — H35341 Macular cyst, hole, or pseudohole, right eye: Secondary | ICD-10-CM | POA: Insufficient documentation

## 2021-10-14 DIAGNOSIS — H2513 Age-related nuclear cataract, bilateral: Secondary | ICD-10-CM | POA: Insufficient documentation

## 2021-10-14 DIAGNOSIS — Z01818 Encounter for other preprocedural examination: Secondary | ICD-10-CM | POA: Insufficient documentation

## 2021-10-14 DIAGNOSIS — H01006 Unspecified blepharitis left eye, unspecified eyelid: Secondary | ICD-10-CM

## 2021-10-14 DIAGNOSIS — Z8669 Personal history of other diseases of the nervous system and sense organs: Secondary | ICD-10-CM

## 2021-10-14 DIAGNOSIS — K219 Gastro-esophageal reflux disease without esophagitis: Secondary | ICD-10-CM

## 2021-10-14 DIAGNOSIS — Z6835 Body mass index (BMI) 35.0-35.9, adult: Secondary | ICD-10-CM

## 2021-10-14 DIAGNOSIS — G629 Polyneuropathy, unspecified: Secondary | ICD-10-CM

## 2021-10-14 DIAGNOSIS — H1189 Other specified disorders of conjunctiva: Secondary | ICD-10-CM

## 2021-10-14 DIAGNOSIS — H119 Unspecified disorder of conjunctiva: Secondary | ICD-10-CM | POA: Insufficient documentation

## 2021-10-14 DIAGNOSIS — E785 Hyperlipidemia, unspecified: Secondary | ICD-10-CM

## 2021-10-14 MED ORDER — NEOMYCIN 3.5 MG/G-POLYMYXIN B 10,000 UNIT/G-DEXAMETH 0.1 % EYE OINT
TOPICAL_OINTMENT | Freq: Four times a day (QID) | OPHTHALMIC | 2 refills | Status: DC
Start: 2021-10-14 — End: 2022-05-27

## 2021-10-14 NOTE — Progress Notes (Addendum)
[facility]  [place] [address]  Operated by [facility]    10/14/2021    Patient Name: [name]    Date of Birth:  Aug 26, 1956    Referring Provider:  Unknown, Provider  No address on file    Ophthalmologist/Optometrist:      Chief Complaint    Stye         HPI     Stye     Additional comments: LLL             Comments    New pt visit for LLL chalazion occurring for around 2 months, referred here by Dr. Lucia Gaskins. Pt states she has never had a stye before this. Pt has been using WC BID right side for around 2 months, which hasn't seemed to help.   Per pt, she feels the chalazion is increasing in size.   Pt states having some blurry vision OS, thought to be r/t the chalazion.   Pt using AT PRN OU.   Pt denies any drainage/crusting left side, since onset of chalazion.     Pt states no changes with floaters OD, pt currently waiting sx with Dr. Darnell Level for mac hole, right eye.             Last edited by Ringer, Stana Bunting, COA on 10/14/2021  8:54 AM.          ROS    Positive for: Eyes (chalazion LLL)  Negative for: Constitutional, Gastrointestinal, Neurological, Skin, Genitourinary, Musculoskeletal, HENT, Endocrine, Cardiovascular, Respiratory, Psychiatric, Allergic/Imm, Heme/Lymph  Last edited by Ringer, Stana Bunting, COA on 10/14/2021  8:54 AM.              Past Ocular history:   Past Surgical History:   Procedure Laterality Date   . COLONOSCOPY N/A 07/23/2019    Performed by Virgel Manifold, MD at [facility]   . COLONOSCOPY N/A 01/06/2009    Performed by Tama High, MD at [facility]   . COLONOSCOPY WITH BIOPSY N/A 07/23/2019    Performed by Virgel Manifold, MD at [facility]   . COLONOSCOPY WITH POLYPECTOMY N/A 07/23/2019    Performed by Virgel Manifold, MD at [facility]   . CYSTOSCOPY N/A 09/24/2009    Performed by Domingo Sep, MD at [facility]   . CYSTOSCOPY WITH CYSTOMETROGRAM AND URODYNAMICS N/A  05/01/2018    Performed by Thurmon Fair, MD at [facility]   . EYELID RETRACTION REPAIR WITH EAR CARTILAGE GRAFT Right 08/10/2016    Performed by [name], MD at [facility] [place]   . EYELID RETRACTION REPAIR WITH EAR CARTILAGE GRAFT Bilateral 01/28/2015    Performed by [name], MD at [facility]   . GASTROSCOPY Left 03/05/2010    Performed by Jocelyn Lamer, MD at [facility]   . GASTROSCOPY Left 12/19/2007    Performed by Laurine Blazer, MD at [facility]   . GASTROSCOPY WITH BIOPSY Left 03/05/2010    Performed by Jocelyn Lamer, MD at [facility]   . HEMITHYROIDECTOMY Right 04/05/2019    Performed by Delfino Lovett, MD at [facility]   . HX ADENOIDECTOMY     . HX ANKLE FRACTURE TX  2007    left distal fibula, casted   . HX APPENDECTOMY     .  HX CHOLECYSTECTOMY     . HX COLONOSCOPY     . HX CYSTOCELE REPAIR  09/24/09   . HX HAND SURGERY  2012    for Carpal tunnel : right side treated   . HX HEART CATHETERIZATION     . HX OOPHORECTOMY      left ovary removed   . HX PAROTIDECTOMY       "surgical clamp"    . HX PARTIAL THYROIDECTOMY Right 04/05/2019    R hemithyroidectomy for nodule, final path follicular adenoma   . HX TONSILLECTOMY     . HX TOTAL VAGINAL HYSTERECTOMY  1979   . HX WRIST FRACTURE [place]  2007    [place] injury   . MANOMETRY ESOPHAGEAL N/A 01/03/2008    Performed by Laurine Blazer, MD at [facility]   . PAROTIDECTOMY SUPERFICIAL WITH FACIAL NERVE DISSECTION Right 02/06/2015    Performed by Delfino Lovett, MD at [facility]   . PB REVISE ULNAR NERVE AT ELBOW Left 1979   . PB UPPER GI ENDOSCOPY,BIOPSY  12/19/07    patulous GE junction zone, erythema, nonerosive GERD   . [place] PROBE N/A 01/03/2008    Performed by Laurine Blazer, MD at [facility]   . RELEASE CARPAL TUNNEL Right 11/30/2010    Performed by Stoney Bang, MD at [facility]   . REPAIR ANTERIOR AND POSTERIOR N/A 09/24/2009    Performed by Domingo Sep, MD at [facility]   . REPAIR ECTROPION BILATERAL Bilateral 01/28/2015     Performed by [name], MD at [facility]   . SUSPENSION VAGINAL VAULT N/A 09/24/2009    Performed by Domingo Sep, MD at [facility]   . VAGINECTOMY PARTIAL N/A 09/24/2009    Performed by Domingo Sep, MD at [place] Ringer, COA 10/14/2021, 16:53    MD Addition to HPI:    NPV, referred by Dr. Lucia Gaskins for Chalazion LLL that has been ongoing for about 2 months. Pt states it was initially painful but has now subsided. Pt feels like it is continuing to grow in size despite using warm compresses.Endorses occ blurry VA and hx of floaters. Denies new FOL/floaters, diplopia, HA, and pain. Pt has been using AT OU PRN and WC PRN with no resolve.     Impression(s):      ICD-10-CM    1. Chalazion of left lower eyelid  H00.15 OPH EXTERNAL PHOTOS      2. Blepharitis  H01.009       3. Conjunctival lesion  H11.9       4. Macular hole of right eye  H35.341       5. Full thickness macular hole, right  H35.341       6. Nuclear sclerotic cataract of both eyes  H25.13           Plan(s)/Recommendation(s):    Chalazion LLL blepharitis with Caruncle lesions L  - will monitor and see if it improves, can consider excision at next appointment if it is not better  - start Maxitrol ung TID-QID  - cont WC  - RTC 1 month    [employer] OU  - not visually significant  - cont to monitor and follow with Dr. Lucia Gaskins    Hx of macular hole OD  Vision stable  Observe    Imaging (CT/MRI) Reviewed:  Results Reviewed:       I personally performed the services described in this documentation, as scribed  in my presence, and it is both accurate  and complete.  [name], MD 10/14/2021  16:53    I am scribing for, and in the presence of Dr. Alfonse Spruce for services provided on 10/14/2021.  [name], SCRIBE   [name], [place]  10/14/2021, 09:37    I saw and examined the patient.  I reviewed the technician/resident's note.  I agree with the findings and plan of care as documented in the resident's note.  Any  exceptions/additions are edited/noted.    [name], MD 10/14/2021  16:53      Eye Examination:     Neuro/Psych     Oriented x3: Yes    Mood/Affect: Normal        Visual Acuity     Visual Acuity (Snellen - Linear)       Right Left    Dist cc 20/200 -1 20/25 -1    Dist ph cc NI     Correction: Glasses          Edited by: Ringer, Stana Bunting, COA            Not recorded       Not recorded       Not recorded       Confrontational Visual Fields     Visual Fields (Counting fingers)       Right Left      Full   OD: Missing/Large Black Dot central vision OD             Edited by: Ringer, Stana Bunting, COA            Pupils       Pupils APD    Right PERRL None    Left PERRL None        Extraocular Movement     Extraocular Movement       Right Left     Full Full          Edited by: Ringer, Stana Bunting, COA                               Sensation   V1: Intact bilaterally   V2: Intact bilaterally   V3: Intact bilaterally    Facial Contours:     Hertel:                            OD:                            OS:                            Base:    Eyelids:    Bell's:      Lid edema AL:PFXT OS: None Conj chem OD: None KW:IOXB   Lid inject OD: None OS: None Conj inject OD: None OS: None             PF   MRD                       LF   LC  USS   LSS                    LAG            Main Ophthalmology Exam     External Exam       Right Left    External Normal Normal          Slit Lamp Exam       Right Left    Lids/Lashes Dermatochalasis - upper lid chalazion LL, Dermatochalasis - upper lid, Blepharitis    Conjunctiva/Sclera White and quiet cacruncle lesion ?oncocytoma    Cornea Clear Clear    Anterior Chamber Deep and quiet Deep and quiet    Iris Round and reactive Round and reactive    Lens trace NS trace NS                IOP straight Tonometry     Tonometry (Tonopen, 9:03 AM)       Right Left    Pressure 13 13          Edited by: Ringer, Stana Bunting, COA                Upgaze     5 down      Past  Medical / Surgical / Family / Social History:      has a past medical history of Anxiety, Arthritis, Cancer (CMS [place]), Chronic pain, Depression, Dyspnea on exertion, Essential hypertension (02/16/2017), GERD (gastroesophageal reflux disease), Headache, Heart murmur (1979), Hyperlipidemia LDL goal < 100 (08/29/2013), MINOR CAD (coronary artery disease) (11/10/2012), Multinodular goiter, Muscle weakness, Obesity, Peripheral edema, Rash, Rheumatic fever (16), S/P thyroidectomy, Squamous cell carcinoma (2/40/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    She has no past medical history of Allergic rhinitis, Anemia, Anesthesia complication, Angina at rest (CMS [facility]), Angina pectoris (CMS [facility]), Aortic aneurysm (CMS [place]), Asthma, Asthma, Automatic implantable cardioverter-defibrillator in situ, Awareness under anesthesia, Back problem, BiPAP (biphasic positive airway pressure) dependence, Blood transfusion, without reported diagnosis, Breast CA (CMS [facility]), Bruises easily, Cataract, Cervical cancer (CMS HCC), Chronic bronchitis (CMS HCC), Chronic obstructive airway disease (CMS HCC), Clotting disorder (CMS HCC), Cognitive dysfunction, Colon cancer (CMS HCC), Congenital anomaly of heart, Congestive heart failure, unspecified, COPD (chronic obstructive pulmonary disease) (CMS HCC), CPAP (continuous positive airway pressure) dependence, CVA (cerebrovascular accident) (CMS [place]), Deep vein thrombosis (DVT) (CMS [place]), Diabetes, Diabetes mellitus (CMS [place]), Diabetes mellitus type II, Diabetes mellitus, type 1, Diabetes mellitus, type 2 (CMS [place]), Difficult intubation, Difficult intubation, Difficulty waking, Disorder of liver, Dysrhythmias, Endometrial cancer (CMS HCC), Glaucoma, H/O hearing loss, H/O urinary tract infection, Hard to intubate, Hearing loss, Hepatic cirrhosis (CMS HCC), Hepatitis A virus infection, HTN, breast cancer, coronary artery bypass graft, transfusion,  Kidney disease, Loss of weight, Malignant hyperthermia, MI (myocardial infarction) (CMS HCC), Myocardial infarction (CMS HCC), Nausea with vomiting, Neck problem, Other convulsions, Ovarian cancer (CMS [place]), Pacemaker, Palpitations, Panic attack, Personal history of irradiation, Personal history of malignant neoplasm of prostate, PONV (postoperative nausea and vomiting), PONV (postoperative nausea and vomiting), Problems with swallowing, Pseudocholinesterase deficiency, Pulmonary embolism (CMS HCC), Pulmonary emphysema (CMS HCC), Renal failure, Seizure (CMS HCC), Seizures, Shortness of breath, Sleep apnea, Spinal headache, STD (sexually transmitted disease), Stented coronary artery, Stroke (CMS HCC), Thyroid disorder, Treatment, Type 2 diabetes mellitus, Type 2 diabetes mellitus (CMS [place]), Type I diabetes mellitus (CMS [place]), Upper respiratory infection, Uterine cancer (CMS [place]), Valvular disease, Viral hepatitis B,  or Viral hepatitis C.    Past Surgical History:   Procedure Laterality Date   . COLONOSCOPY  01/06/09    COLONOSCOPY performed by Loletta Parish F at [facility]   . GASTROSCOPY  03/05/2010    GASTROSCOPY performed by Jocelyn Lamer at [facility]   . GASTROSCOPY WITH BIOPSY  03/05/2010    GASTROSCOPY WITH BIOPSY performed by Jocelyn Lamer at [facility]   . HX ADENOIDECTOMY     . HX ANKLE FRACTURE TX  2007    left distal fibula, casted   . HX APPENDECTOMY     . HX CHOLECYSTECTOMY     . HX COLONOSCOPY     . HX CYSTOCELE REPAIR  09/24/09   . HX HAND SURGERY  2012    for Carpal tunnel : right side treated   . HX HEART CATHETERIZATION     . HX OOPHORECTOMY      left ovary removed   . HX PAROTIDECTOMY       "surgical clamp"    . HX PARTIAL THYROIDECTOMY Right 04/05/2019    R hemithyroidectomy for nodule, final path follicular adenoma   . HX TONSILLECTOMY     . HX TOTAL VAGINAL HYSTERECTOMY  1979   . HX WRIST FRACTURE [place]  2007    [place] injury   . PB REVISE ULNAR NERVE AT ELBOW Left 1979   . PB UPPER GI ENDOSCOPY,BIOPSY   12/19/07    patulous GE junction zone, erythema, nonerosive GERD           Family Medical History:     Problem Relation (Age of Onset)    Bipolar Disorder Daughter, Paternal Uncle    Breast Cancer Paternal Aunt, Paternal Aunt, Paternal Aunt, Paternal Aunt, Paternal Aunt, Other    Cancer Paternal Aunt, Paternal 73, Paternal 43, Paternal 66, Paternal 32, Other (76)    Congestive Heart Failure Father (27)    Coronary Artery Disease Father, Mother (52)    Diabetes Mother    Heart Attack Father, Sister    High Cholesterol Mother    Hypertension (High Blood Pressure) Mother, Brother, Sister, Sister    Kidney Disease Sister    Leukemia Paternal Uncle    Stroke Paternal Grandmother    Thyroid Disease Sister            Social History     Tobacco Use   . Smoking status: Former     Packs/day: 2.00     Years: 40.00     Pack years: 80.00     Types: Cigarettes     Quit date: 01/22/2005     Years since quitting: 16.7   . Smokeless tobacco: Never   Vaping Use   . Vaping Use: Never used   Substance Use Topics   . Alcohol use: No     Alcohol/week: 0.0 standard drinks   . Drug use: No       I have seen and examined the above patient. I discussed the above diagnoses listed in the assessment and the above ophthalmic plan of care with the patient and patient's family. All questions were answered. I reviewed and, when necessary, made changes to the technician/resident note, documented ophthalmology exam, chief complaint, history of present illness, allergies, review of systems, past medical, past surgical, family and social history.    [name], MD 10/14/2021  16:53

## 2021-10-14 NOTE — Progress Notes (Signed)
[name]  MRN: [id number]  DOB: 24-Feb-1956  Date: 10/14/2021      CC: Preoperative Clearance    Requesting MD: Dr. Normand Sloop    HPI:  65 y.o. female here as a consultation visit for preoperative clearance for full thickness macular hole repair.     PMH:   Surgeries:  Past Surgical History:   Procedure Laterality Date    COLONOSCOPY  01/06/09    COLONOSCOPY performed by Augustin Schooling F at [facility] OR ENDO    GASTROSCOPY  03/05/2010    GASTROSCOPY performed by Knox Saliva at [facility] OR ENDO    GASTROSCOPY WITH BIOPSY  03/05/2010    GASTROSCOPY WITH BIOPSY performed by Knox Saliva at [facility] OR ENDO    HX ADENOIDECTOMY      HX ANKLE FRACTURE TX  2007    left distal fibula, casted    HX APPENDECTOMY      HX CHOLECYSTECTOMY      HX COLONOSCOPY      HX CYSTOCELE REPAIR  09/24/09    HX HAND SURGERY  2012    for Carpal tunnel : right side treated    HX HEART CATHETERIZATION      HX OOPHORECTOMY      left ovary removed    HX PAROTIDECTOMY       "surgical clamp"     HX PARTIAL THYROIDECTOMY Right 04/05/2019    R hemithyroidectomy for nodule, final path follicular adenoma    HX TONSILLECTOMY      HX TOTAL VAGINAL HYSTERECTOMY  1979    HX WRIST FRACTURE TX  2007    FOOSH injury    PB REVISE ULNAR NERVE AT ELBOW Left 1979    PB UPPER GI ENDOSCOPY,BIOPSY  12/19/07    patulous GE junction zone, erythema, nonerosive GERD         Illnesses:   Patient Active Problem List   Diagnosis    Migraine Headaches    GERD not well controlled    Patellofemoral pain syndrome    Multinodular goiter    Microscopic hematuria    Neck pain    Lichen sclerosus et atrophicus of the vulva    Carpal tunnel syndrome on right    Osteoarthritis, knee    Left knee pain    Chest pain at rest    MINOR CAD (coronary artery disease)    Hyperlipidemia LDL goal < 100    Otitis media of both ears    Mass of parotid gland, right    Squamous cell carcinoma    Chest pain    Dysuria    Multiple pulmonary nodules determined by computed  tomography of lung    History of rheumatic fever    Neuropathy (CMS HCC)    PTSD (post-traumatic stress disorder)    Essential hypertension    Acute suppurative otitis media without spontaneous rupture of ear drum    Pain due to neuropathy of facial nerve    Lung nodule < 6cm on CT    Encounter for HCV screening test for high risk patient    S/P thyroidectomy    GAD (generalized anxiety disorder)    Severe episode of recurrent major depressive disorder, 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)    Right hip pain    Hand pain    Cervical neck pain with evidence of disc disease    Lumbar pain with radiation down right leg     Medications:   Current Outpatient Medications   Medication Sig  aspirin (ECOTRIN) 81 mg Oral Tablet, Delayed Release (E.C.) Take 81 mg by mouth Once a day (Patient not taking: Reported on 10/14/2021)    Azelaic Acid 20 % Cream Apply topically Twice daily    buPROPion (WELLBUTRIN XL) 300 mg extended release 24 hr tablet Take 1 Tablet (300 mg total) by mouth Once a day    cetirizine (ZYRTEC) 10 mg Oral Tablet Take 1 Tablet (10 mg total) by mouth Once a day    diclofenac sodium (VOLTAREN) 1 % Gel Apply topically Four times a day - before meals and bedtime    FLUoxetine 20 mg Oral Tablet Take 3 tablets daily (60 mg total)    fluticasone propionate (FLONASE) 50 mcg/actuation Nasal Spray, Suspension Administer 2 Sprays into each nostril Once a day    gabapentin (NEURONTIN) 600 mg Oral Tablet Take 1 Tablet (600 mg total) by mouth Twice daily    Halobetasol Propionate (ULTRAVATE) 0.05 % Cream Apply topically Twice per day as needed Apply  topically. Three times a week    ipratropium-albuterol 0.5 mg-3 mg(2.5 mg base)/3 mL Solution for Nebulization 3 mL by Nebulization route Four times a day    loratadine (CLARITIN) 10 mg Oral Tablet Take 1 Tablet (10 mg total) by mouth Once a day    methocarbamoL (ROBAXIN) 500 mg Oral Tablet Take 1 Tablet (500 mg total) by mouth Four times a day  (Patient not taking: Reported on 10/14/2021)    mineral oil-hydrophil petrolat (AQUAPHOR) Ointment Apply topically Once per day as needed    mirtazapine (REMERON) 15 mg Oral Tablet Take 1 Tablet (15 mg total) by mouth Every night    montelukast (SINGULAIR) 10 mg Oral Tablet Take 1 Tab (10 mg total) by mouth Once a day (Patient not taking: Reported on 10/14/2021)    neomycin-polymyxin-dexamethasone (MAXITROL) 3.5 mg/g-10,000 unit/g-0.1 % Ophthalmic Ointment Apply to left eye Four times a day    nitroGLYCERIN (NITROSTAT) 0.4 mg Sublingual Tablet, Sublingual 1 Tab (0.4 mg total) by Sublingual route Every 5 minutes as needed for Chest pain for up to 3 doses for 3 doses over 15 minutes    nystatin (NYSTOP) 100,000 unit/gram Powder Apply once or twice daily to PREVENT rash under breasts    pantoprazole (PROTONIX) 40 mg Oral Tablet, Delayed Release (E.C.) Take 1 Tablet (40 mg total) by mouth Once a day    tolterodine (DETROL) 2 mg Oral Tablet Take 2 mg by mouth Once a day    triamcinolone acetonide (ARISTOCORT A) 0.1 % Ointment by Apply Topically route Twice daily (Patient taking differently: Apply topically Twice per day as needed)     Allergies:   Allergies   Allergen Reactions    Cymbalta [Duloxetine]      Rash on feet and hands    Blue Dye      Told to avoid due to patch test result by [facility]    Flavoring Agent  Other Adverse Reaction (Add comment)     Patient unsure of reaction    Lipitor [Atorvastatin] Myalgia     Muscle pain    Nickel      Blisters  "Allergic to all kinds of metal"     Codeine Itching     RASH    Mobic [Meloxicam] Nausea/ Vomiting     Nervous and shakes     Naprosyn [Naproxen] Nausea/ Vomiting    Nsaids (Non-Steroidal Anti-Inflammatory Drug) Nausea/ Vomiting     Immunizations: Tetanus    Family History:   Family Medical History:  Problem Relation (Age of Onset)    Bipolar Disorder Daughter, Paternal Uncle    Breast Cancer Paternal Aunt, Paternal Aunt, Paternal Aunt, Paternal  Aunt, Paternal Aunt, Other    Cancer Paternal Aunt, Paternal Aunt, Paternal Aunt, Paternal Aunt, Paternal Aunt, Other (37)    Congestive Heart Failure Father (50)    Coronary Artery Disease Father, Mother (19)    Diabetes Mother    Heart Attack Father, Sister    High Cholesterol Mother    Hypertension (High Blood Pressure) Mother, Brother, Sister, Sister    Kidney Disease Sister    Leukemia Paternal Uncle    Stroke Paternal Grandmother    Thyroid Disease Sister          No history of malignant hyperthermia  No history of anesthesia complications    Social History:   Social History     Tobacco Use    Smoking status: Former     Packs/day: 2.00     Years: 40.00     Pack years: 80.00     Types: Cigarettes     Quit date: 01/22/2005     Years since quitting: 16.7    Smokeless tobacco: Never   Substance Use Topics    Alcohol use: No     Alcohol/week: 0.0 standard drinks     Recreational Drug Use:      ROS:   Bleeding: negative  Chest Pain: negative  Shortness of Breath with Exertion: negative   GERD:  Controlled   No loose teeth    Functional Capacity:   Can patient perform 4 METS (metabolic energy expenditure unit) without cardiac symptoms. Commonly used items are the ability to walk up 1 flight of stairs holding a bag of groceries or the ability to walk on level ground at 4 miles per hour (or 1 mile in 15 minutes). If patient has good functional capacity (4 METS or higher) an intermediate risk procedure can proceed without further evaluation. If patient has symptoms or if functional capacity cannot be properly evaluated, the next step is to evaluate clinical risk factors.   Patient can walk up one flight of stairs holding a bag of groceries without experiencing chest pain or pressure.   Patient can walk on level ground at 4 miles per hour without experiencing chest pain or pressure.     Physical Exam:   Vitals:    10/14/21 1523   BP: 118/64   Pulse: 92   Temp: 36.1 C (97 F)   TempSrc: Thermal Scan   SpO2: 98%    Weight: 95.8 kg (211 lb 3.2 oz)   Height: 1.651 m (5\' 5" )   BMI: 35.22             Labs/Studies:   Chest X-ray, ECG, CBC with diff, BMP, U/A, PT/INR, and PTT to be completed during pre-admission testing if deemed appropriate.    ASSESSMENT/PLAN:   1. Preoperative Consultation for full thickness macular hole repair. Patient has 0  risk factors for major cardiac complications by the Revised Goldman Cardiac Risk Index. This puts him/her at approximately 0.4% risk of a major cardiac complication during the planned procedure. Patient has been informed of this risk, is willing to proceed, and thus is medically cleared for the planned procedure.   2. Reason for surgery: full thickness macular hole   3. Other issues:  Hypertension, hyperlipidemia, neuropathy, GERD  4. Patient is cleared for surgery from a primary care standpoint.  To complete preop testing on 10/22/21.  Revised Goldman Cardiac Risk Index:  Six independent predictors of major cardiac complications (defined as myocardial infarction, pulmonary edema, ventricular fibrillation or primary cardiac arrest, and complete heart block)  High-risk type of surgery (intraperitoneal, intrathoracic, or suprainguinal vascular surgery)   History of ischemic heart disease (history of MI or a positive exercise test, current complaint of chest pain considered to be secondary to myocardial ischemia, use of nitrate therapy, or ECG with pathological Q waves; do not count prior coronary revascularization procedure unless one of the other criteria for ischemic heart disease is present)   History of HF   History of cerebrovascular disease   Diabetes mellitus requiring treatment with insulin   Preoperative serum creatinine >2.0 mg/dL (413 mol/L)   These predictors were then validated in a cohort of 1422 patients. The predictive value was significant in all types of elective major noncardiac surgery except for abdominal aortic aneurysm surgery (show figure 1).  The risk of major  cardiac complications varied according to the number of risk factors. If one includes only cardiac death, nonfatal MI, and nonfatal cardiac arrest as major cardiac events in the [place] cohort, the following rates of adverse outcomes were seen [7]:  No risk factors -- 0.4 percent (95% CI 0.1-0.8 percent)   One risk factor -- 1.0 percent (95% CI 0.5-1.4 percent) (Consider noninvasive cardiac testing)  Two risk factors -- 2.4 percent (95% CI 1.3-3.5 percent) (Consider noninvasive cardiac testing)  Three or more risk factors -- 5.4 percent (95% CI 2.8-7.9 percent)       Jahzion Brogden Dalton, PA-C  10/14/2021, 16:15

## 2021-10-15 ENCOUNTER — Encounter (HOSPITAL_BASED_OUTPATIENT_CLINIC_OR_DEPARTMENT_OTHER): Payer: Self-pay | Admitting: Physician Assistant

## 2021-10-20 ENCOUNTER — Other Ambulatory Visit: Payer: Self-pay

## 2021-10-20 ENCOUNTER — Ambulatory Visit (INDEPENDENT_AMBULATORY_CARE_PROVIDER_SITE_OTHER): Payer: MEDICAID | Admitting: Student in an Organized Health Care Education/Training Program

## 2021-10-20 VITALS — BP 141/84 | HR 95 | Resp 18 | Ht 65.0 in | Wt 212.7 lb

## 2021-10-20 DIAGNOSIS — Z6835 Body mass index (BMI) 35.0-35.9, adult: Secondary | ICD-10-CM

## 2021-10-20 DIAGNOSIS — F332 Major depressive disorder, recurrent severe without psychotic features: Secondary | ICD-10-CM

## 2021-10-20 DIAGNOSIS — F4310 Post-traumatic stress disorder, unspecified: Secondary | ICD-10-CM

## 2021-10-20 DIAGNOSIS — F331 Major depressive disorder, recurrent, moderate: Secondary | ICD-10-CM

## 2021-10-20 DIAGNOSIS — F411 Generalized anxiety disorder: Secondary | ICD-10-CM

## 2021-10-20 MED ORDER — FLUOXETINE 20 MG TABLET
ORAL_TABLET | ORAL | 2 refills | Status: DC
Start: 2021-10-20 — End: 2022-02-04

## 2021-10-20 MED ORDER — MIRTAZAPINE 15 MG TABLET
15.0000 mg | ORAL_TABLET | Freq: Every evening | ORAL | 2 refills | Status: DC
Start: 2021-10-20 — End: 2022-02-04

## 2021-10-20 MED ORDER — BUPROPION HCL XL 300 MG 24 HR TABLET, EXTENDED RELEASE
300.0000 mg | ORAL_TABLET | Freq: Every day | ORAL | 2 refills | Status: DC
Start: 2021-10-20 — End: 2022-02-04
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
## 2021-10-22 ENCOUNTER — Encounter (HOSPITAL_COMMUNITY): Payer: Self-pay

## 2021-10-22 ENCOUNTER — Inpatient Hospital Stay (HOSPITAL_COMMUNITY)
Admission: RE | Admit: 2021-10-22 | Discharge: 2021-10-22 | Disposition: A | Discharge status: Home / Self Care | Payer: MEDICAID | Source: Ambulatory Visit

## 2021-10-22 ENCOUNTER — Other Ambulatory Visit: Payer: Self-pay

## 2021-10-22 ENCOUNTER — Inpatient Hospital Stay
Admission: RE | Admit: 2021-10-22 | Discharge: 2021-10-22 | Disposition: A | Discharge status: Home / Self Care | Payer: MEDICAID | Source: Ambulatory Visit | Attending: Ophthalmology | Admitting: Ophthalmology

## 2021-10-22 ENCOUNTER — Other Ambulatory Visit (INDEPENDENT_AMBULATORY_CARE_PROVIDER_SITE_OTHER): Payer: MEDICAID | Admitting: Rheumatology

## 2021-10-22 VITALS — BP 137/84 | HR 62 | Temp 97.2°F | Resp 18 | Ht 65.0 in | Wt 212.7 lb

## 2021-10-22 DIAGNOSIS — Z01818 Encounter for other preprocedural examination: Secondary | ICD-10-CM

## 2021-10-22 DIAGNOSIS — Z6835 Body mass index (BMI) 35.0-35.9, adult: Secondary | ICD-10-CM

## 2021-10-22 LAB — ECG 12-LEAD (PERFORMED IN PREADMISSION UNIT ONLY)
Atrial Rate: 69 {beats}/min
Calculated P Axis: 69 degrees
Calculated T Axis: 27 degrees
PR Interval: 158 ms
QRS Duration: 76 ms
QT Interval: 398 ms
QTC Calculation: 426 ms
Ventricular rate: 69 {beats}/min

## 2021-10-22 LAB — BASIC METABOLIC PANEL
ANION GAP: 7 mmol/L (ref 4–13)
BUN/CREA RATIO: 12 (ref 6–22)
BUN: 14 mg/dL (ref 8–25)
CALCIUM: 9.1 mg/dL (ref 8.8–10.2)
CHLORIDE: 105 mmol/L (ref 96–111)
CO2 TOTAL: 24 mmol/L (ref 23–31)
CREATININE: 1.14 mg/dL — ABNORMAL HIGH (ref 0.60–1.05)
ESTIMATED GFR: 54 mL/min/BSA — ABNORMAL LOW (ref >=60)
GLUCOSE: 86 mg/dL (ref 65–125)
POTASSIUM: 4.3 mmol/L (ref 3.5–5.1)
SODIUM: 136 mmol/L (ref 136–145)

## 2021-10-22 LAB — POC BLOOD GLUCOSE (RESULTS): GLUCOSE, POC: 84 mg/dl (ref 70–105)

## 2021-10-22 NOTE — Anesthesia Preprocedure Evaluation (Addendum)
ANESTHESIA PRE-OP EVALUATION  [name]  Planned Procedure: VITRECTOMY POSTERIOR (Right: Eye)  INJECTION EXCHANGE GAS (Right)  Review of Systems                   Pulmonary     Cardiovascular    No peripheral edema,        GI/Hepatic/Renal           Endo/Other         Neuro/Psych/MS        Cancer                      Physical Assessment      Airway       Mallampati: II    TM distance: >3 FB    Neck ROM: limited  Mouth Opening: good.  No Facial hair  No Beard  No endotracheal tube present  No Tracheostomy present    Dental           (+) upper dentures, lower dentures, edentulous, missing           Pulmonary    Breath sounds clear to auscultation  (-) no rhonchi, no decreased breath sounds, no wheezes, no rales and no stridor     Cardiovascular    Rhythm: regular  Rate: Normal  (-) no friction rub, carotid bruit is not present, no peripheral edema and no murmur     Other findings            Plan  ASA 3     Planned anesthesia type: MAC                                                             DOS: 11/02/2021       LOS: 90 mins    EKG: 10/22/21 by [facility]  EKG preliminary read by NP    CXR: not ordered    Labs: 10/22/21 by [facility]: BMP    Most Recent Vitals    Flowsheet Row Return Patient Visit from 10/22/2021 in Preoperative   [facility], [place]   Temperature 36.2 C (97.2 F) filed at... 10/22/2021 1138   Heart Rate 62 filed at... 10/22/2021 1138   Respiratory Rate 18 filed at... 10/22/2021 1138   BP (Non-Invasive) 137/84 filed at... 10/22/2021 1138   SpO2 97 % filed at... 10/22/2021 1138   Height 1.651 m (_0 ) filed at... 10/22/2021 1138   Weight 96.5 kg (212 lb 11.9 oz) filed at... 10/22/2021 1138   BMI (Calculated) 35.48 filed at... 10/22/2021 1138   BSA (Calculated) 2.1 filed at... 10/22/2021 1138   fsbs 84    Patient provided anesthesia consent in [place]. Instructed to review consent prior to OR date. Educated that consent will be signed morning of surgery with anesthesiologist.  Patient  instructed to avoid NSAIDs, vitamins, supplements, and fish oil 7 days prior to surgery including: Voltaren gel  Patient instructed to take the following medications the morning of procedure: as needed Tylenol, Wellbutrin, Zyrtec, Prozac, Flonase, Gabapentin, DuoNeb, Claritin, Robaxin, Protonix  Aspirin- last dose 08/2021.   Nothing on skin the morning of surgery.

## 2021-10-27 ENCOUNTER — Encounter (INDEPENDENT_AMBULATORY_CARE_PROVIDER_SITE_OTHER): Payer: Self-pay | Admitting: Ophthalmology

## 2021-10-28 ENCOUNTER — Other Ambulatory Visit: Payer: Self-pay

## 2021-10-28 ENCOUNTER — Encounter (HOSPITAL_BASED_OUTPATIENT_CLINIC_OR_DEPARTMENT_OTHER): Payer: Self-pay | Admitting: Physician Assistant

## 2021-10-28 ENCOUNTER — Ambulatory Visit: Payer: MEDICAID | Attending: Physician Assistant | Admitting: Physician Assistant

## 2021-10-28 VITALS — BP 118/64 | HR 95 | Temp 97.5°F | Ht 65.0 in | Wt 206.8 lb

## 2021-10-28 DIAGNOSIS — G629 Polyneuropathy, unspecified: Secondary | ICD-10-CM

## 2021-10-28 DIAGNOSIS — R059 Cough, unspecified: Secondary | ICD-10-CM

## 2021-10-28 DIAGNOSIS — I10 Essential (primary) hypertension: Secondary | ICD-10-CM | POA: Insufficient documentation

## 2021-10-28 MED ORDER — MOXIFLOXACIN 0.5 % EYE DROPS
OPHTHALMIC | 0 refills | Status: DC
Start: 2021-10-28 — End: 2022-08-05
  Filled 2021-10-28: qty 3, 3d supply, fill #0

## 2021-10-28 MED ORDER — CYCLOPENTOLATE 1 % EYE DROPS
OPHTHALMIC | 0 refills | Status: DC
Start: 2021-10-28 — End: 2022-05-27
  Filled 2021-10-28: qty 2, 2d supply, fill #0

## 2021-10-28 MED ORDER — PREDNISOLONE ACETATE 1 % EYE DROPS,SUSPENSION
OPHTHALMIC | 0 refills | Status: DC
Start: 2021-10-28 — End: 2022-08-05
  Filled 2021-10-28: qty 5, 5d supply, fill #0

## 2021-10-28 NOTE — Progress Notes (Addendum)
Department of Family Medicine   Progress Note    [name]  MRN: [id number]  DOB: December 08, 1955  Date of Service: 10/28/2021    CHIEF COMPLAINT  Chief Complaint   Patient presents with    Check Up       SUBJECTIVE  [name] is a 66 y.o. female who presents to clinic for 6 months  check up. Patient stated she started to have cough, runny nose and vomiting on Saturday. She used Flonase and her symptoms improved. She mentioned she still has some small cough. She will have surgery on her right eye on January 9th and she wanted to make sure if these cough not stop her from doing surgery. Patient denied fever,chills and productive cough.      Review of Systems:  Positive ROS discussed in HPI, otherwise all other systems negative.      Medications:   Azelaic Acid 20 % Cream, Apply topically Twice daily  buPROPion (WELLBUTRIN XL) 300 mg extended release 24 hr tablet, Take 1 Tablet (300 mg total) by mouth Once a day  cetirizine (ZYRTEC) 10 mg Oral Tablet, Take 1 Tablet (10 mg total) by mouth Once a day  cyclopentolate (CYCLOGYL) 1 % Ophthalmic Drops, FOR USE IN THE SURGICAL SUITE AND 3 TIMES A DAY INTO RIGHT EYE AS DIRECTED  diclofenac sodium (VOLTAREN) 1 % Gel, Apply topically Four times a day - before meals and bedtime  FLUoxetine 20 mg Oral Tablet, Take 3 tablets daily (60 mg total)  fluticasone propionate (FLONASE) 50 mcg/actuation Nasal Spray, Suspension, Administer 2 Sprays into each nostril Once a day  gabapentin (NEURONTIN) 600 mg Oral Tablet, Take 1 Tablet (600 mg total) by mouth Twice daily  Halobetasol Propionate (ULTRAVATE) 0.05 % Cream, Apply topically Twice per day as needed Apply  topically. Three times a week  loratadine (CLARITIN) 10 mg Oral Tablet, Take 1 Tablet (10 mg total) by mouth Once a day  mineral oil-hydrophil petrolat (AQUAPHOR) Ointment, Apply topically Once per day as needed  mirtazapine (REMERON) 15 mg Oral Tablet, Take 1 Tablet (15 mg total) by mouth Every night  moxifloxacin  (VIGAMOX) 0.5 % Ophthalmic Drops, FOR USE IN THE SURGICAL SUITE AND 4 TIMES A DAY INTO RIGHT EYE AS DIRECTED  neomycin-polymyxin-dexamethasone (MAXITROL) 3.5 mg/g-10,000 unit/g-0.1 % Ophthalmic Ointment, Apply to left eye Four times a day  nitroGLYCERIN (NITROSTAT) 0.4 mg Sublingual Tablet, Sublingual, 1 Tab (0.4 mg total) by Sublingual route Every 5 minutes as needed for Chest pain for up to 3 doses for 3 doses over 15 minutes  nystatin (NYSTOP) 100,000 unit/gram Powder, Apply once or twice daily to PREVENT rash under breasts  pantoprazole (PROTONIX) 40 mg Oral Tablet, Delayed Release (E.C.), Take 1 Tablet (40 mg total) by mouth Once a day  prednisoLONE acetate (PRED FORTE) 1 % Ophthalmic Drops, Suspension, FOR USE IN THE SURGICAL SUITE AND 4 TIMES A DAY INTO RIGHT EYE AS DIRECTED  triamcinolone acetonide (ARISTOCORT A) 0.1 % Ointment, by Apply Topically route Twice daily (Patient taking differently: Apply topically Twice per day as needed)  aspirin (ECOTRIN) 81 mg Oral Tablet, Delayed Release (E.C.), Take 81 mg by mouth Once a day (Patient not taking: Reported on 10/14/2021)  ipratropium-albuterol 0.5 mg-3 mg(2.5 mg base)/3 mL Solution for Nebulization, 3 mL by Nebulization route Four times a day (Patient not taking: Reported on 10/22/2021)  methocarbamoL (ROBAXIN) 500 mg Oral Tablet, Take 1 Tablet (500 mg total) by mouth Four times a day (Patient not taking: Reported on  10/14/2021)  montelukast (SINGULAIR) 10 mg Oral Tablet, Take 1 Tab (10 mg total) by mouth Once a day (Patient not taking: Reported on 10/14/2021)  tolterodine (DETROL) 2 mg Oral Tablet, Take 2 mg by mouth Once a day (Patient not taking: Reported on 10/22/2021)    No facility-administered medications prior to visit.      Allergies:   Allergies   Allergen Reactions    Cymbalta [Duloxetine]      Rash on feet and hands    Blue Dye      Told to avoid due to patch test result by Derm Jessamine    Flavoring Agent  Other Adverse Reaction (Add comment)      Patient unsure of reaction    Lipitor [Atorvastatin] Myalgia     Muscle pain    Nickel      Blisters  "Allergic to all kinds of metal"     Codeine Itching     RASH    Mobic [Meloxicam] Nausea/ Vomiting     Nervous and shakes     Naprosyn [Naproxen] Nausea/ Vomiting    Nsaids (Non-Steroidal Anti-Inflammatory Drug) Nausea/ Vomiting         OBJECTIVE  BP 118/64 (Site: Left, Patient Position: Sitting, Cuff Size: Adult)    Pulse 95    Temp 36.4 C (97.5 F) (Thermal Scan)    Ht 1.651 m (5\' 5" )    Wt 93.8 kg (206 lb 12.7 oz)    SpO2 97%    BMI 34.41 kg/m       General: no distress  HENT: TMs clear, mouth mucous membranes moist, pharynx without injection or exudate   Lungs: clear to auscultation bilaterally  Cardiovascular: RRR, no murmur  Abdomen: soft, non tender, bowel sounds present  Extremities: no cyanosis or edema  Skin: warm and dry, no rash  Neurologic: gait is normal, AOx3  Psychiatric: normal affect and behavior        ASSESSMENT/PLAN    (I10) Essential hypertension  Plan:   Chronic, well controlled   Continue current regimen      (R05.9) Cough, unspecified type  Plan:   Acute complaint  Likely viral in seems to be improving   Patient not undergoing general anesthesia and may continue with surgery as scheduled pending symptoms do not worsen        Return in about 6 months (around 04/27/2022), or if symptoms worsen or fail to improve.    On the day of the encounter, a total of  20 minutes was spent on this patient encounter including review of historical information, examination, documentation and post-visit activities.     Marliss Coots, STUDENT PHYSICIAN ASSISTANT 10/28/2021, 13:18   I have seen and examined the patient in concert with the PA student as noted above.  The above note has been modified as necessary by me to reflect my own, personal collection/validation of the patient's HPI, patient history, physical examination, and assessment and plan.    Alvis Lemmings, PA-C  11/05/2021, 23:18

## 2021-10-28 NOTE — Progress Notes (Deleted)
Department of Family Medicine   Progress Note    [name]  MRN: [id number]  DOB: 1956/10/06  Date of Service: 10/28/2021    CHIEF COMPLAINT  Chief Complaint   Patient presents with   . Check Up       SUBJECTIVE  [name] is a 66 y.o. female who presents to clinic for ***.             Review of Systems:  Positive ROS discussed in HPI, otherwise all other systems negative.      Medications:   aspirin (ECOTRIN) 81 mg Oral Tablet, Delayed Release (E.C.), Take 81 mg by mouth Once a day (Patient not taking: Reported on 10/14/2021)  Azelaic Acid 20 % Cream, Apply topically Twice daily  buPROPion (WELLBUTRIN XL) 300 mg extended release 24 hr tablet, Take 1 Tablet (300 mg total) by mouth Once a day  cetirizine (ZYRTEC) 10 mg Oral Tablet, Take 1 Tablet (10 mg total) by mouth Once a day  cyclopentolate (CYCLOGYL) 1 % Ophthalmic Drops, FOR USE IN THE SURGICAL SUITE AND 3 TIMES A DAY INTO RIGHT EYE AS DIRECTED  diclofenac sodium (VOLTAREN) 1 % Gel, Apply topically Four times a day - before meals and bedtime  FLUoxetine 20 mg Oral Tablet, Take 3 tablets daily (60 mg total)  fluticasone propionate (FLONASE) 50 mcg/actuation Nasal Spray, Suspension, Administer 2 Sprays into each nostril Once a day  gabapentin (NEURONTIN) 600 mg Oral Tablet, Take 1 Tablet (600 mg total) by mouth Twice daily  Halobetasol Propionate (ULTRAVATE) 0.05 % Cream, Apply topically Twice per day as needed Apply  topically. Three times a week  ipratropium-albuterol 0.5 mg-3 mg(2.5 mg base)/3 mL Solution for Nebulization, 3 mL by Nebulization route Four times a day (Patient not taking: Reported on 10/22/2021)  loratadine (CLARITIN) 10 mg Oral Tablet, Take 1 Tablet (10 mg total) by mouth Once a day  methocarbamoL (ROBAXIN) 500 mg Oral Tablet, Take 1 Tablet (500 mg total) by mouth Four times a day (Patient not taking: Reported on 10/14/2021)  mineral oil-hydrophil petrolat (AQUAPHOR) Ointment, Apply topically Once per day as  needed  mirtazapine (REMERON) 15 mg Oral Tablet, Take 1 Tablet (15 mg total) by mouth Every night  montelukast (SINGULAIR) 10 mg Oral Tablet, Take 1 Tab (10 mg total) by mouth Once a day (Patient not taking: Reported on 10/14/2021)  moxifloxacin (VIGAMOX) 0.5 % Ophthalmic Drops, FOR USE IN THE SURGICAL SUITE AND 4 TIMES A DAY INTO RIGHT EYE AS DIRECTED  neomycin-polymyxin-dexamethasone (MAXITROL) 3.5 mg/g-10,000 unit/g-0.1 % Ophthalmic Ointment, Apply to left eye Four times a day  nitroGLYCERIN (NITROSTAT) 0.4 mg Sublingual Tablet, Sublingual, 1 Tab (0.4 mg total) by Sublingual route Every 5 minutes as needed for Chest pain for up to 3 doses for 3 doses over 15 minutes  nystatin (NYSTOP) 100,000 unit/gram Powder, Apply once or twice daily to PREVENT rash under breasts  pantoprazole (PROTONIX) 40 mg Oral Tablet, Delayed Release (E.C.), Take 1 Tablet (40 mg total) by mouth Once a day  prednisoLONE acetate (PRED FORTE) 1 % Ophthalmic Drops, Suspension, FOR USE IN THE SURGICAL SUITE AND 4 TIMES A DAY INTO RIGHT EYE AS DIRECTED  tolterodine (DETROL) 2 mg Oral Tablet, Take 2 mg by mouth Once a day (Patient not taking: Reported on 10/22/2021)  triamcinolone acetonide (ARISTOCORT A) 0.1 % Ointment, by Apply Topically route Twice daily (Patient taking differently: Apply topically Twice per day as needed)    No facility-administered medications prior to visit.  Allergies:   Allergies   Allergen Reactions   . Cymbalta [Duloxetine]      Rash on feet and hands   . Blue Dye      Told to avoid due to patch test result by Simone Curia   . Flavoring Agent  Other Adverse Reaction (Add comment)     Patient unsure of reaction   . Lipitor [Atorvastatin] Myalgia     Muscle pain   . Nickel      Blisters  "Allergic to all kinds of metal"    . Codeine Itching     RASH   . Mobic [Meloxicam] Nausea/ Vomiting     Nervous and shakes    . Naprosyn [Naproxen] Nausea/ Vomiting   . Nsaids (Non-Steroidal Anti-Inflammatory Drug) Nausea/ Vomiting          OBJECTIVE  BP 118/64 (Site: Left, Patient Position: Sitting, Cuff Size: Adult)   Pulse 95   Temp 36.4 C (97.5 F) (Thermal Scan)   Ht 1.651 m (5\' 5" )   Wt 93.8 kg (206 lb 12.7 oz)   SpO2 97%   BMI 34.41 kg/m       General: no distress  HENT: TMs clear, mouth mucous membranes moist, pharynx without injection or exudate   Lungs: clear to auscultation bilaterally  Cardiovascular: RRR, no murmur  Abdomen: soft, non tender, bowel sounds present  Extremities: no cyanosis or edema  Skin: warm and dry, no rash  Neurologic: gait is normal, AOx3, CN 2-12 grossly intact  Psychiatric: normal affect and behavior    ASSESSMENT/PLAN  No diagnosis found.      No orders of the defined types were placed in this encounter.        No follow-ups on file.    On the day of the encounter, a total of  *** ([phone], [phone], [phone]) minutes was spent on this patient encounter including review of historical information, examination, documentation and post-visit activities.     Alvis Lemmings, PA-C 10/28/2021, 12:58

## 2021-10-30 ENCOUNTER — Other Ambulatory Visit (HOSPITAL_BASED_OUTPATIENT_CLINIC_OR_DEPARTMENT_OTHER): Payer: Self-pay | Admitting: Physician Assistant

## 2021-10-30 DIAGNOSIS — N904 Leukoplakia of vulva: Secondary | ICD-10-CM

## 2021-11-02 ENCOUNTER — Encounter (HOSPITAL_COMMUNITY)
Admission: RE | Disposition: A | Discharge status: Home / Self Care | Payer: Self-pay | Source: Ambulatory Visit | Attending: Ophthalmology

## 2021-11-02 ENCOUNTER — Ambulatory Visit (HOSPITAL_BASED_OUTPATIENT_CLINIC_OR_DEPARTMENT_OTHER): Payer: MEDICAID | Admitting: Anesthesiology

## 2021-11-02 ENCOUNTER — Encounter (HOSPITAL_COMMUNITY): Payer: Self-pay | Admitting: Ophthalmology

## 2021-11-02 ENCOUNTER — Ambulatory Visit (HOSPITAL_COMMUNITY): Payer: MEDICAID | Admitting: NURSE PRACTITIONER, FAMILY

## 2021-11-02 ENCOUNTER — Inpatient Hospital Stay
Admission: RE | Admit: 2021-11-02 | Discharge: 2021-11-02 | Disposition: A | Discharge status: Home / Self Care | Payer: MEDICAID | Source: Ambulatory Visit | Attending: Ophthalmology | Admitting: Ophthalmology

## 2021-11-02 ENCOUNTER — Encounter (HOSPITAL_COMMUNITY): Payer: MEDICAID | Admitting: Ophthalmology

## 2021-11-02 ENCOUNTER — Other Ambulatory Visit: Payer: Self-pay

## 2021-11-02 DIAGNOSIS — H35341 Macular cyst, hole, or pseudohole, right eye: Secondary | ICD-10-CM | POA: Insufficient documentation

## 2021-11-02 DIAGNOSIS — Z6835 Body mass index (BMI) 35.0-35.9, adult: Secondary | ICD-10-CM

## 2021-11-02 HISTORY — PX: PARS PLANA VITRECTOMY: SHX2166

## 2021-11-02 SURGERY — INJECTION EXCHANGE GAS
Anesthesia: Monitor Anesthesia Care | Site: Eye | Laterality: Right | Wound class: Clean Wound: Uninfected operative wounds in which no inflammation occurred

## 2021-11-02 MED ORDER — DEXTROSE 5% IN WATER (D5W) FLUSH BAG - 250 ML
INTRAVENOUS | Status: DC | PRN
Start: 2021-11-02 — End: 2021-11-02

## 2021-11-02 MED ORDER — EPINEPHRINE HCL (PF) 1 MG/ML (1 ML) INJECTION SOLUTION
Freq: Once | INTRAOCULAR | Status: DC | PRN
Start: 2021-11-02 — End: 2021-11-02

## 2021-11-02 MED ORDER — CEFUROXIME 50 MG/0.5 ML OPHTH INJ
50.0000 mg | INJECTION | Freq: Once | Status: AC
Start: 2021-11-02 — End: 2021-11-02
  Administered 2021-11-02: 50 mg via SUBCONJUNCTIVAL

## 2021-11-02 MED ORDER — PHENYLEPHRINE 2.5 % EYE DROPS
1.0000 [drp] | OPHTHALMIC | Status: AC
Start: 2021-11-02 — End: 2021-11-02
  Administered 2021-11-02 (×3): 1 [drp] via OPHTHALMIC
  Filled 2021-11-02: qty 2

## 2021-11-02 MED ORDER — CHONDROITIN-SOD HYALURON 4 %-3 % (40 MG-30 MG/ML) OPHTHALMIC SOLUTION
1.0000 [drp] | INJECTION | Freq: Once | INTRAOCULAR | Status: DC | PRN
Start: 2021-11-02 — End: 2021-11-02
  Administered 2021-11-02: 1 [drp] via OPHTHALMIC

## 2021-11-02 MED ORDER — INDOCYANINE GREEN 25 MG SOLUTION FOR INJECTION
1.0000 | Freq: Once | INTRAMUSCULAR | Status: DC | PRN
Start: 2021-11-02 — End: 2021-11-02
  Administered 2021-11-02: 25 mg via INTRAVITREAL

## 2021-11-02 MED ORDER — SODIUM CHLORIDE 0.9 % (FLUSH) INJECTION SYRINGE
2.0000 mL | INJECTION | Freq: Three times a day (TID) | INTRAMUSCULAR | Status: DC
Start: 2021-11-02 — End: 2021-11-02

## 2021-11-02 MED ORDER — ONDANSETRON HCL (PF) 4 MG/2 ML INJECTION SOLUTION
INTRAMUSCULAR | Status: AC
Start: 2021-11-02 — End: 2021-11-02
  Filled 2021-11-02: qty 2

## 2021-11-02 MED ORDER — PROPARACAINE 0.5 % EYE DROPS
1.0000 [drp] | OPHTHALMIC | Status: DC
Start: 2021-11-02 — End: 2021-11-02

## 2021-11-02 MED ORDER — HYDROMORPHONE 1 MG/ML INJECTION WRAPPER
0.2000 mg | INJECTION | INTRAMUSCULAR | Status: DC | PRN
Start: 2021-11-02 — End: 2021-11-02

## 2021-11-02 MED ORDER — LIDOCAINE-EPINEPHRINE (PF) 2 %-1:200,000 INJECTION SOLUTION
Freq: Once | INTRAMUSCULAR | Status: DC | PRN
Start: 2021-11-02 — End: 2021-11-02

## 2021-11-02 MED ORDER — DEXAMETHASONE SODIUM PHOSPHATE 4 MG/ML INJECTION SOLUTION
4.0000 mg | Freq: Once | INTRAMUSCULAR | Status: DC | PRN
Start: 2021-11-02 — End: 2021-11-02

## 2021-11-02 MED ORDER — CYCLOPENTOLATE 1 % EYE DROPS
1.0000 [drp] | OPHTHALMIC | Status: AC | PRN
Start: 2021-11-02 — End: 2021-11-02
  Administered 2021-11-02 (×3): 1 [drp] via OPHTHALMIC

## 2021-11-02 MED ORDER — MIDAZOLAM 1 MG/ML INJECTION SOLUTION
INTRAMUSCULAR | Status: AC
Start: 2021-11-02 — End: 2021-11-02
  Filled 2021-11-02: qty 2

## 2021-11-02 MED ORDER — TRIAMCINOLONE ACETONIDE 40 MG/ML SUSPENSION FOR INJECTION
40.0000 mg | Freq: Once | INTRAMUSCULAR | Status: AC
Start: 2021-11-02 — End: 2021-11-02
  Administered 2021-11-02: 40 mg via INTRAMUSCULAR

## 2021-11-02 MED ORDER — SODIUM CHLORIDE 0.9 % (FLUSH) INJECTION SYRINGE
2.0000 mL | INJECTION | INTRAMUSCULAR | Status: DC | PRN
Start: 2021-11-02 — End: 2021-11-02

## 2021-11-02 MED ORDER — OXYCODONE 5 MG TABLET
10.0000 mg | ORAL_TABLET | Freq: Once | ORAL | Status: DC | PRN
Start: 2021-11-02 — End: 2021-11-02

## 2021-11-02 MED ORDER — OXYCODONE 5 MG TABLET
5.0000 mg | ORAL_TABLET | Freq: Once | ORAL | Status: DC | PRN
Start: 2021-11-02 — End: 2021-11-02

## 2021-11-02 MED ORDER — REMIFENTANIL 1 MG INTRAVENOUS SOLUTION
INTRAVENOUS | Status: AC
Start: 2021-11-02 — End: 2021-11-02
  Filled 2021-11-02: qty 1

## 2021-11-02 MED ORDER — SODIUM CHLORIDE 0.9% FLUSH BAG - 250 ML
INTRAVENOUS | Status: DC | PRN
Start: 2021-11-02 — End: 2021-11-02

## 2021-11-02 MED ORDER — PREDNISOLONE ACETATE 1 % EYE DROPS,SUSPENSION
1.0000 [drp] | Freq: Once | OPHTHALMIC | Status: AC
Start: 2021-11-02 — End: 2021-11-02
  Administered 2021-11-02: 1 [drp] via OPHTHALMIC

## 2021-11-02 MED ORDER — PROCHLORPERAZINE EDISYLATE 10 MG/2 ML (5 MG/ML) INJECTION SOLUTION
5.0000 mg | Freq: Once | INTRAMUSCULAR | Status: DC | PRN
Start: 2021-11-02 — End: 2021-11-02

## 2021-11-02 MED ORDER — TOBRAMYCIN-DEXAMETHASONE 0.3 %-0.1 % EYE OINTMENT
TOPICAL_OINTMENT | Freq: Once | OPHTHALMIC | Status: DC | PRN
Start: 2021-11-02 — End: 2021-11-02

## 2021-11-02 MED ORDER — LACTATED RINGERS INTRAVENOUS SOLUTION
INTRAVENOUS | Status: DC
Start: 2021-11-02 — End: 2021-11-02

## 2021-11-02 MED ORDER — DROPERIDOL 2.5 MG/ML INJECTION SOLUTION
0.6250 mg | Freq: Once | INTRAMUSCULAR | Status: DC | PRN
Start: 2021-11-02 — End: 2021-11-02

## 2021-11-02 MED ORDER — ONDANSETRON HCL (PF) 4 MG/2 ML INJECTION SOLUTION
Freq: Once | INTRAMUSCULAR | Status: DC | PRN
Start: 2021-11-02 — End: 2021-11-02
  Administered 2021-11-02: 4 mg via INTRAVENOUS

## 2021-11-02 MED ORDER — BALANCED SALT SOLUTION COMBINATION NO.2 INTRAOCULAR IRRIGATION
15.0000 mL | Freq: Once | INTRAOCULAR | Status: DC | PRN
Start: 2021-11-02 — End: 2021-11-02
  Administered 2021-11-02: 15 mL via OPHTHALMIC

## 2021-11-02 MED ORDER — MEPERIDINE (PF) 25 MG/ML INJECTION SOLUTION
12.5000 mg | INTRAMUSCULAR | Status: DC | PRN
Start: 2021-11-02 — End: 2021-11-02

## 2021-11-02 MED ORDER — REMIFENTANIL 1 MG INTRAVENOUS SOLUTION
Freq: Once | INTRAVENOUS | Status: DC | PRN
Start: 2021-11-02 — End: 2021-11-02
  Administered 2021-11-02: 25 ug via INTRAVENOUS
  Administered 2021-11-02: 75 ug via INTRAVENOUS

## 2021-11-02 MED ORDER — DEXAMETHASONE INJ OPHTH 2 MG / 0.5 ML
2.0000 mg | INJECTION | Freq: Once | Status: DC | PRN
Start: 2021-11-02 — End: 2021-11-02
  Administered 2021-11-02: 2 mg via SUBCONJUNCTIVAL

## 2021-11-02 MED ORDER — MIDAZOLAM (PF) 1 MG/ML INJECTION SOLUTION
Freq: Once | INTRAMUSCULAR | Status: DC | PRN
Start: 2021-11-02 — End: 2021-11-02
  Administered 2021-11-02: 2 mg via INTRAVENOUS

## 2021-11-02 MED ORDER — MOXIFLOXACIN 0.5 % EYE DROPS
1.0000 [drp] | OPHTHALMIC | Status: AC
Start: 2021-11-02 — End: 2021-11-02
  Administered 2021-11-02 (×3): 1 [drp] via OPHTHALMIC

## 2021-11-02 MED ORDER — HYDROMORPHONE 1 MG/ML INJECTION WRAPPER
0.4000 mg | INJECTION | INTRAMUSCULAR | Status: DC | PRN
Start: 2021-11-02 — End: 2021-11-02

## 2021-11-02 SURGICAL SUPPLY — 52 items
APPL COTTON WD 6IN SWBSTK STRL_LF DISP (MED SURG SUPPLIES) ×6 IMPLANT
ARMBOARD IV FM PSTNR (IV TUBING & ACCESSORIES) ×2
ARMBRD POSITION 20X8X2IN DVN FOAM (IV TUBING & ACCESSORIES) ×4 IMPLANT
CANNULA ANT CHMBR 27G X 7/8IN_585157 10EA/BX (MED SURG SUPPLIES)
CANNULA ASP 25 GA 1 1/4IN_M4477 10EA/BX (IV TUBING & ACCESSORIES) ×3 IMPLANT
CANNULA OPTH 25GA SFT TIP DISP (MED SURG SUPPLIES) ×2 IMPLANT
CANNULA OPTH GRY 7/8IN 27GA VSTC 45D RND CORT HDSCT CLEAVE 11MM STRL LF (MED SURG SUPPLIES) IMPLANT
CANNULA SOFT TIP 25GA 300825_6EA/BX (MED SURG SUPPLIES) ×1
CONV USE ITEM 32095 - NEEDLE HYPO  30GA .5IN STD REG BVL LF (MED SURG SUPPLIES) ×2 IMPLANT
CUP MED 2OZ PLASTIC GRAD STRL LF  DISP (MED SURG SUPPLIES) ×2 IMPLANT
CUP MED 2OZ PLASTIC GRAD STRL_LF DISP (MED SURG SUPPLIES) ×1
DISCONTINUED USE ITEM 342928 - SYRINGE .5IN 27GA 1ML LF  STRL DTCH NEEDLE LL TIP TB STD (MED SURG SUPPLIES) ×2 IMPLANT
DRAPE RESIGHT HEAD COVER_W/ELASTIC (DRAPE/PACKS/SHEETS/OR TOWEL) ×1
DUPE USE ITEM 135982 - DRAPE RESIGHT HEAD COVER_W/ELASTIC (DRAPE/PACKS/SHEETS/OR TOWEL) ×2 IMPLANT
GARTER EYE ASST_28-6536 (GLOVES AND ACCESSORIES) ×3 IMPLANT
HANDLE RIGID PLASTIC STRL LF  DISP DVN EZ HNDL SURG LIGHT (MED SURG SUPPLIES) ×2
HANDLE RIGID PLASTIC STRL LF_DISP DVN EZ HNDL SURG LIGHT (MED SURG SUPPLIES) ×1
LABEL E-Z STICK_STLEZP1 100EA/CS (MED SURG SUPPLIES) ×1
LABEL MED EZ PEEL MRKR LF (MED SURG SUPPLIES) ×2 IMPLANT
MBO USE ITEM 317672 - HANDLE RIGID PLASTIC STRL LF  DISP DVN EZ HNDL SURG LIGHT (MED SURG SUPPLIES) ×2 IMPLANT
NEEDLE HYPO  30GA .5IN STD REG BVL LF (MED SURG SUPPLIES) ×2
NEEDLE HYPO 30GA .5IN STD REG_BVL LF (MED SURG SUPPLIES) ×1
NEEDLE OPTH 1.5IN 25GA ATKNSN 22D RTRBLBR 1 BVL ANG PNT STRL LF  DISP (OPHTHALMIC SUPPLIES (NOT LENS)) ×2 IMPLANT
NEEDLE OPTH 1.5IN 25GA ATKNSN_RTRBLBR DISP (OPTHALMIC SUPPLIES (NOT LENS)) ×1
PACK EYE PREP (CUSTOM TRAYS & PACK) ×1
PACK SURG CSTM EYE PREP STRL DISP LF (CUSTOM TRAYS & PACK) ×2
PACK SURG CUSTOM EYE PREP STRL DISP LF (CUSTOM TRAYS & PACK) ×2 IMPLANT
PACK VITRECT CNSTL ENGAUGE V-LOCITY GAS FIL SYRG STRL LF  DISP (OPHTHALMIC SUPPLIES (NOT LENS)) ×2 IMPLANT
PACK VITRECTOMY 23GA PROBE_AS1016818 (CUSTOM TRAYS & PACK) ×3 IMPLANT
PACK VITRECTOMY 25GA PROBE_AS73089 4EA/BX (CUSTOM TRAYS & PACK) IMPLANT
PAD EYE 2.62X1.62IN SURECARE COTTON ABS LF  STRL (WOUND CARE SUPPLY) ×2 IMPLANT
PAD EYE SURE CARE GAUZE 82911_25EA/TY 24TY/CS (WOUND CARE/ENTEROSTOMAL SUPPLY) ×1
PAK CONSTELLATION AUTO GASFILL_8065751014 6EA/BX (OPTHALMIC SUPPLIES (NOT LENS)) ×1
Pinnacle 360 25Ga Tewari Omnicep Forceps ×3 IMPLANT
SCRAPER OPTH 23GA DDMS RTNA TANO STRL LF  DISP (OPHTHALMIC SUPPLIES (NOT LENS)) IMPLANT
SCRAPER OPTH 23GA DDMS RTNA TA_NO STRL LF DISP (OPTHALMIC SUPPLIES (NOT LENS))
SCRAPER OPTH 25GA DDMS TANO ST_RL LF DISP (OPTHALMIC SUPPLIES (NOT LENS)) ×1
SCRAPER OPTH 25GA RTNA DDMS TANO (OPHTHALMIC SUPPLIES (NOT LENS)) ×2 IMPLANT
SHIELD EYE 76.2X60.3MM ASST AL EY GRTR FOX CVR (OPHTHALMIC SUPPLIES (NOT LENS)) ×2 IMPLANT
SHIELD EYE 76.2X60.3MM ASST AL_EY GRTR FOX CVR (OPTHALMIC SUPPLIES (NOT LENS)) ×1
SOL IV VIAFLEX D5 PLASTIC CONTAINR 100ML (MEDICATIONS/SOLUTIONS) ×2 IMPLANT
SOLUTION IV D5W 100CC 2B0087_SINGLE PACK 96EA/CS (MEDICATIONS/SOLUTIONS) ×1
SUTURE 7-0 TG140-8 VICRYL MICROP 18IN VIOL 2 ARM BRD COAT ABS (SUTURE/WOUND CLOSURE) IMPLANT
SUTURE 7-0 TG140-8 VICRYL MICR_OP 18IN VIOL 2 ARM BRD COAT (SUTURE/WOUND CLOSURE)
SYRINGE .5IN 27GA 1ML LF  STRL DTCH NEEDLE LL TIP TB STD (MED SURG SUPPLIES) ×2
SYRINGE .5IN 27GA 1ML LF STRL_DTCH NEEDLE LL TIP TB STD (MED SURG SUPPLIES) ×1
SYRINGE BD LL 10ML LF STRL CO_NTROL CONCEN TIP PRGN FREE (MED SURG SUPPLIES) ×1
SYRINGE LL 10ML LF  STRL CONTROL CONCEN TIP PRGN FREE DEHP-FR MED DISP (MED SURG SUPPLIES) ×2 IMPLANT
TAPE ADH 10YDX1IN CURAD PPR HYPOALL LF  NONST WHT (WOUND CARE SUPPLY) ×2 IMPLANT
TAPE ADH 10YDX1IN CURAD PPR HY_POALL LF NONST WHT (WOUND CARE/ENTEROSTOMAL SUPPLY) ×1
TAPE TRANSPORE 1IN 12/BX_120/CS 15271 (WOUND CARE SUPPLY) ×2 IMPLANT
TAPE TRANSPORE 1IN 12/BX_120/CS 15271 (WOUND CARE/ENTEROSTOMAL SUPPLY) ×1

## 2021-11-02 NOTE — Anesthesia Postprocedure Evaluation (Signed)
Anesthesia Post Op Evaluation    Patient: [name]  Procedure(s) with comments:  INJECTION EXCHANGE GAS - SF6 20%  VITRECTOMY POSTERIOR WITH MEMBRANE PEEL    Last Vitals:Temperature: 36.5 C (97.7 F) (11/02/21 1432)  Heart Rate: 74 (11/02/21 1432)  BP (Non-Invasive): 124/70 (11/02/21 1432)  Respiratory Rate: 16 (11/02/21 1432)  SpO2: 92 % (11/02/21 1432)    No notable events documented.      Patient location during evaluation: bedside         Pain management: adequate  Airway patency: patent    Anesthetic complications: no  Cardiovascular status: acceptable  Respiratory status: acceptable  Hydration status: acceptable  Patient post-procedure temperature: Pt Normothermic

## 2021-11-02 NOTE — Discharge Instructions (Signed)
SURGICAL DISCHARGE INSTRUCTIONS     Dr. Gala Murdoch, Antonietta Jewel, MD  performed your INJECTION EXCHANGE GAS, VITRECTOMY POSTERIOR WITH MEMBRANE PEEL today at the [facility]:  Monday through Friday from 6 a.m. - 7 p.m.: [phone]  Between 7 p.m. - 6 a.m., weekends and holidays:  Call [facility] at [phone] or [phone].    PLEASE SEE WRITTEN HANDOUTS AS DISCUSSED BY YOUR NURSE:      SIGNS AND SYMPTOMS OF A WOUND / INCISION INFECTION   Be sure to watch for the following:  Increase in redness or red streaks near or around the wound or incision.  Increase in pain that is intense or severe and cannot be relieved by the pain medication that your doctor has given you.  Increase in swelling that cannot be relieved by elevation of a body part, or by applying ice, if permitted.  Increase in drainage, or if yellow / green in color and smells bad. This could be on a dressing or a cast.  Increase in fever for longer than 24 hours, or an increase that is higher than 101 degrees Fahrenheit (normal body temperature is 98 degrees Fahrenheit). The incision may feel warm to the touch.    **CALL YOUR DOCTOR IF ONE OR MORE OF THESE SIGNS / SYMPTOMS SHOULD OCCUR.    ANESTHESIA INFORMATION   LOCAL ANESTHETIC:  You have receieved a local anesthetic, the effects should disappear in a few hours.    REMEMBER   If you experience any difficulty breathing, chest pain, bleeding that you feel is excessive, persistent nausea or vomiting or for any other concerns:  Call your physician Dr. Gala Murdoch at [phone] or [phone]. You may also ask to have the Ophthalmology doctor on call paged. They are available to you 24 hours a day.    SPECIAL INSTRUCTIONS / COMMENTS   See Handouts     FOLLOW-UP APPOINTMENTS   Please call patient services at [phone] or [phone] to schedule a date / time of return. They are open Monday - Friday from 7:30 am - 5:00 pm.

## 2021-11-02 NOTE — Anesthesia Transfer of Care (Signed)
ANESTHESIA TRANSFER OF CARE   [name] is a 66 y.o. ,female, Weight: 96.1 kg (211 lb 13.8 oz)   had Procedure(s) with comments:  INJECTION EXCHANGE GAS - SF6 20%  VITRECTOMY POSTERIOR WITH MEMBRANE PEEL  performed  11/02/21   Primary Service: Elmer Bales*    Past Medical History:   Diagnosis Date   . Anxiety    . Arthritis    . Cancer (CMS HCC)     internal skin cancer   . Chronic pain     lower back   . Depression    . Dyspnea on exertion    . Essential hypertension 02/16/2017    patient denies   . GERD (gastroesophageal reflux disease)     controlled w/med   . Headache    . Heart murmur 1979    benign   . Hyperlipidemia LDL goal < 100 08/29/2013    denies   . MINOR CAD (coronary artery disease) 11/10/2012    no treatment other than cholesterol medications; follows regularly with K.Dalton, PA   . Multinodular goiter    . Muscle weakness     neck    . Obesity    . Peripheral edema    . Rash     face only eczema   . Rheumatic fever     in 1025 no complications   . S/P thyroidectomy    . Squamous cell carcinoma 02/06/2015    right side of neck   . 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 Vaginal prolapse 2012    surgery improved significantly   . Wears glasses       Allergy History as of 11/02/21     CODEINE       Noted Status Severity Type Reaction    12/10/15 0941 Pantojas, Pe[place]  Active Low  Itching    Comments: RASH     09/19/13 1430 Wallace Cullens Joy  Deleted Low  Itching    Comments: RASH     09/04/09 Ginette Pitman  Active   Itching    Comments: RASH     10/10/07   Active       Comments: RASH           STRAWBERRY       Noted Status Severity Type Reaction    03/18/11 1634 Johnnette Barrios  Deleted       10/10/08 [name], MD  Deleted       10/10/07   Active             MELOXICAM       Noted Status Severity Type Reaction    11/24/18 0857 Kisner, Bishop Limbo, RN 06/26/09 Active Low  Nausea/ Vomiting    Comments: Nervous and shakes      12/10/15 0941 Pantojas, Marita [place] 06/26/09  Active Low  Nausea/ Vomiting    06/26/09 Waldon Reining 06/26/09 Active   Nausea/ Vomiting          NICKEL       Noted Status Severity Type Reaction    07/19/19 0959 Linnell Fulling, RN 07/24/12 Active  Topical     Comments: Blisters  "Allergic to all kinds of metal"      11/24/18 0856 Kisner, Bishop Limbo, RN 07/24/12 Active  Topical     Comments: Blisters       07/24/12 1041 [name], MD 07/24/12 Active  Topical  BLUE DYE       Noted Status Severity Type Reaction    07/24/12 1041 [name], MD 07/24/12 Active  Topical     Comments: Told to avoid due to patch test result by Derm Clyde           DULOXETINE       Noted Status Severity Type Reaction    07/24/12 1052 [name], MD 07/24/12 Active Medium Systemic     Comments: Rash on feet and hands           NAPROXEN       Noted Status Severity Type Reaction    11/01/12 0913 Sanjuana Letters, MA 11/01/12 Active Low  Nausea/ Vomiting          ATORVASTATIN       Noted Status Severity Type Reaction    06/19/18 2149 Erskine Squibb, RX STUDENT 01/27/15 Active  Side Effect Myalgia    Comments: Muscle pain     01/27/15 1035 [name], MD 01/27/15 Active  Side Effect     Comments: Muscle pain           FLAVORING AGENT       Noted Status Severity Type Reaction    07/19/19 0958 Linnell Fulling, RN 05/03/17 Active    Other Adverse Reaction (Add comment)    Comments: Patient unsure of reaction     04/03/18 1905 Suella Grove, MA 05/03/17 Active             NSAIDS (NON-STEROIDAL ANTI-INFLAMMATORY DRUG)       Noted Status Severity Type Reaction    11/24/18 0857 Kisner, Bishop Limbo, RN 05/03/17 Active Low  Nausea/ Vomiting    04/03/18 1905 Suella [place], MA 05/03/17 Active                 I completed my transfer of care / handoff to the receiving personnel during which we discussed:  Access, Airway, All key/critical aspects of case discussed, Analgesia, Antibiotics, Expectation of post procedure,  Fluids/Product, Gave opportunity for questions and acknowledgement of understanding, Labs and PMHx    Post Location: PACU                                                                  Last OR Temp: Temperature: 36.6 C (97.9 F)  ABG:  PCO2 (VENOUS)   Date Value Ref Range Status   02/19/2016 36.00 (L) 41.00 - 51.00 mm/Hg Final     PO2 (VENOUS)   Date Value Ref Range Status   02/19/2016 43.0 35.0 - 50.0 mm/Hg Final     POTASSIUM   Date Value Ref Range Status   10/22/2021 4.3 3.5 - 5.1 mmol/L Final   01/23/2015 4.3 3.5 - 5.1 mmol/L Final     KETONES   Date Value Ref Range Status   11/27/2020 Negative Negative mg/dL Final     WHOLE BLOOD POTASSIUM   Date Value Ref Range Status   02/19/2016 3.9 3.5 - 5.0 mmol/L Final     CALCIUM   Date Value Ref Range Status   10/22/2021 9.1 8.8 - 10.2 mg/dL Final   01/23/2015 9.8 8.5 - 10.4 mg/dL Final     Comment:     Test performed by [facility],  [address], [place],  [place] [address]       Calculated P Axis   Date Value Ref Range Status   10/22/2021 69 degrees Final     Calculated R Axis   Date Value Ref Range Status   11/24/2018 -16 degrees Final     Calculated T Axis   Date Value Ref Range Status   10/22/2021 27 degrees Final     IONIZED CALCIUM   Date Value Ref Range Status   02/19/2016 1.19 1.10 - 1.36 mmol/L Final     GLUCOSE, POINT OF CARE   Date Value Ref Range Status   02/06/2015 104 70 - 105 mg/dL Final     HEMOGLOBIN   Date Value Ref Range Status   02/19/2016 13.9 12.0 - 18.0 g/dL Final     OXYHEMOGLOBIN   Date Value Ref Range Status   02/19/2016 83.5 (H) 40.0 - 80.0 % Final     CARBOXYHEMOGLOBIN   Date Value Ref Range Status   02/19/2016 1.9 0.0 - 2.5 % Final     MET-HEMOGLOBIN   Date Value Ref Range Status   02/19/2016 0.1 0.0 - 3.5 % Final     BASE EXCESS   Date Value Ref Range Status   02/19/2016 0.6 -3.0 - 3.0 mmol/L Final     BICARBONATE (VENOUS)   Date Value Ref Range Status   02/19/2016 25.0 22.0 - 26.0 mmol/L Final     %FIO2 (VENOUS)   Date Value Ref  Range Status   02/19/2016 28.0 % Final     Airway:* No LDAs found *  Blood pressure (!) 143/78, pulse 73, temperature 36.6 C (97.9 F), resp. rate 16, height 1.651 m (5' 5" ), weight 96.1 kg (211 lb 13.8 oz), SpO2 97 %, not currently breastfeeding.

## 2021-11-02 NOTE — H&P (Signed)
[facility]   Ophthalmology    History and Physical    Date of Service:  11/02/2021  [name], [name], 66 y.o. female  Date of Admission:  11/02/2021  Date of Birth:  Jul 19, 1956    HPI/Discussion:  Patient here for preop surgery evaluation.  No recent illnesses.  No chest pain or shortness of breath.  No new medications.      Past Medical History:   Diagnosis Date   . Anxiety    . Arthritis    . Cancer (CMS HCC)     internal skin cancer   . Chronic pain     lower back   . Depression    . Dyspnea on exertion    . Essential hypertension 02/16/2017    patient denies   . GERD (gastroesophageal reflux disease)     controlled w/med   . Headache    . Heart murmur 1979    benign   . Hyperlipidemia LDL goal < 100 08/29/2013    denies   . MINOR CAD (coronary artery disease) 11/10/2012    no treatment other than cholesterol medications; follows regularly with K.Dalton, PA   . Multinodular goiter    . Muscle weakness     neck    . Obesity    . Peripheral edema    . Rash     face only eczema   . Rheumatic fever     in 8527 no complications   . S/P thyroidectomy    . Squamous cell carcinoma 02/06/2015    right side of neck   . 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 Vaginal prolapse 2012    surgery improved significantly   . Wears glasses          Past Surgical History:   Procedure Laterality Date   . COLONOSCOPY  01/06/09    COLONOSCOPY performed by Loletta Parish F at [place]   . GASTROSCOPY  03/05/2010    GASTROSCOPY performed by Jocelyn Lamer at [place]   . GASTROSCOPY WITH BIOPSY  03/05/2010    GASTROSCOPY WITH BIOPSY performed by Jocelyn Lamer at [facility]   . HX ADENOIDECTOMY     . HX ANKLE FRACTURE [place]  2007    left distal fibula, casted   . HX APPENDECTOMY     . HX CHOLECYSTECTOMY     . HX COLONOSCOPY     . HX CYSTOCELE REPAIR  09/24/09   . HX HAND SURGERY  2012    for Carpal tunnel : right side treated   . HX HEART CATHETERIZATION     . HX OOPHORECTOMY      left ovary removed   . HX  PAROTIDECTOMY       "surgical clamp"    . HX PARTIAL THYROIDECTOMY Right 04/05/2019    R hemithyroidectomy for nodule, final path follicular adenoma   . HX TONSILLECTOMY     . HX TOTAL VAGINAL HYSTERECTOMY  1979   . HX WRIST FRACTURE [place]  2007    [place] injury   . PB REVISE ULNAR NERVE AT ELBOW Left 1979   . PB UPPER GI ENDOSCOPY,BIOPSY  12/19/07    patulous GE junction zone, erythema, nonerosive GERD           Prior to Admission Meds:  Medications Prior to Admission     Prescriptions    Azelaic Acid 20 % Cream    Apply topically Twice daily  buPROPion (WELLBUTRIN XL) 300 mg extended release 24 hr tablet    Take 1 Tablet (300 mg total) by mouth Once a day    cetirizine (ZYRTEC) 10 mg Oral Tablet    Take 1 Tablet (10 mg total) by mouth Once a day    cyclopentolate (CYCLOGYL) 1 % Ophthalmic Drops    FOR USE IN THE SURGICAL SUITE AND 3 TIMES A DAY INTO RIGHT EYE AS DIRECTED    Patient not taking:  Reported on 11/02/2021    diclofenac sodium (VOLTAREN) 1 % Gel    Apply topically Four times a day - before meals and bedtime    FLUoxetine 20 mg Oral Tablet    Take 3 tablets daily (60 mg total)    Patient not taking:  Reported on 11/02/2021    fluticasone propionate (FLONASE) 50 mcg/actuation Nasal Spray, Suspension    Administer 2 Sprays into each nostril Once a day    gabapentin (NEURONTIN) 600 mg Oral Tablet    Take 1 Tablet (600 mg total) by mouth Twice daily    Halobetasol Propionate (ULTRAVATE) 0.05 % Cream    Apply topically Twice per day as needed Apply  topically. Three times a week    loratadine (CLARITIN) 10 mg Oral Tablet    Take 1 Tablet (10 mg total) by mouth Once a day    mineral oil-hydrophil petrolat (AQUAPHOR) Ointment    Apply topically Once per day as needed    mirtazapine (REMERON) 15 mg Oral Tablet    Take 1 Tablet (15 mg total) by mouth Every night    moxifloxacin (VIGAMOX) 0.5 % Ophthalmic Drops    FOR USE IN THE SURGICAL SUITE AND 4 TIMES A DAY INTO RIGHT EYE AS DIRECTED    Patient not taking:  Reported  on 11/02/2021    neomycin-polymyxin-dexamethasone (MAXITROL) 3.5 mg/g-10,000 unit/g-0.1 % Ophthalmic Ointment    Apply to left eye Four times a day    Patient not taking:  Reported on 11/02/2021    nitroGLYCERIN (NITROSTAT) 0.4 mg Sublingual Tablet, Sublingual    1 Tab (0.4 mg total) by Sublingual route Every 5 minutes as needed for Chest pain for up to 3 doses for 3 doses over 15 minutes    nystatin (NYSTOP) 100,000 unit/gram Powder    Apply once or twice daily to PREVENT rash under breasts    Patient not taking:  Reported on 11/02/2021    pantoprazole (PROTONIX) 40 mg Oral Tablet, Delayed Release (E.C.)    Take 1 Tablet (40 mg total) by mouth Once a day    prednisoLONE acetate (PRED FORTE) 1 % Ophthalmic Drops, Suspension    FOR USE IN THE SURGICAL SUITE AND 4 TIMES A DAY INTO RIGHT EYE AS DIRECTED    Patient not taking:  Reported on 11/02/2021    triamcinolone acetonide (ARISTOCORT A) 0.1 % Ointment    by Apply Topically route Twice daily    Patient taking differently:  Apply topically Twice per day as needed        Allergies   Allergen Reactions   . Cymbalta [Duloxetine]      Rash on feet and hands   . Blue Dye      Told to avoid due to patch test result by Simone Curia   . Flavoring Agent  Other Adverse Reaction (Add comment)     Patient unsure of reaction   . Lipitor [Atorvastatin] Myalgia     Muscle pain   . Nickel  Blisters  "Allergic to all kinds of metal"    . Codeine Itching     RASH   . Mobic [Meloxicam] Nausea/ Vomiting     Nervous and shakes    . Naprosyn [Naproxen] Nausea/ Vomiting   . Nsaids (Non-Steroidal Anti-Inflammatory Drug) Nausea/ Vomiting     Social History     Tobacco Use   . Smoking status: Former     Packs/day: 2.00     Years: 40.00     Pack years: 80.00     Types: Cigarettes     Quit date: 01/22/2005     Years since quitting: 16.7   . Smokeless tobacco: Never   Substance Use Topics   . Alcohol use: No     Alcohol/week: 0.0 standard drinks     Family Medical History:     Problem Relation (Age of  Onset)    Bipolar Disorder Daughter, Paternal Uncle    Breast Cancer Paternal Aunt, Paternal Aunt, Paternal Aunt, Paternal 33, Paternal 1, Other    Cancer Paternal Aunt, Paternal 69, Paternal 24, Paternal 65, Paternal 21, Other (52)    Congestive Heart Failure Father (78)    Coronary Artery Disease Father, Mother (65)    Diabetes Mother    Heart Attack Father, Sister    High Cholesterol Mother    Hypertension (High Blood Pressure) Mother, Brother, Sister, Sister    Kidney Disease Sister    Leukemia Paternal Uncle    Stroke Paternal Grandmother    Thyroid Disease Sister            ROS: Otherwise negative except as above.     Exam:  Temperature: 36.5 C (97.7 F)  Heart Rate: 70  BP (Non-Invasive): (!) 142/82  Respiratory Rate: 19  SpO2: 98 %  General: appears in good health, comfortable.   Eyes:  Per most recent [facility] note.   HENT:Head atraumatic and normocephalic  Lungs: Breathing nonlabored.   Cardiovascular: regular rate and rhythm  Abdomen: Soft, non-tender  Extremities: No cyanosis or edema  Skin: Skin warm and dry  Neurologic: Grossly normal.  AAOx3.    A/P:   Patient appropriate for eye surgery.    Proceed as planned.     Abundio Miu, MD 11/02/2021, 13:06

## 2021-11-02 NOTE — Nurses Notes (Signed)
Pt adequate for discharge. Dressing clean, dry and intact. Pt and sibling given AVS & discharge instructions with questions answered. IV removed and dressing applied. Pt transported to vehicle via wheelchair by PCA.

## 2021-11-02 NOTE — OR Surgeon (Signed)
Operative Report  Patient: [name]  MRN: [id number]   Date of service: 11/02/2021    PREOPERATIVE DIAGNOSES: right eye  1. Full-thickness macular hole     POSTOPERATIVE DIAGNOSES:  Same    NAME OF PROCEDURES: right eye  1. Pars plana vitrectomy  2. Internal limiting membrane (ILM) and epiretinal membrane (ERM) peel  3. Injection of 20%  sf6 gas  4. Subtenon's injection of Cefuroxime 50 mg and Dexamethasone 2 mg     SURGEONS: Vella Raring MD (attending), Jacqlyn Larsen, MD (fellow)     ANESTHESIA: MAC RETROBULBAR    COMPLICATIONS: None    ESTIMATED BLOOD LOSS: Under 5 cc    DESCRIPTION OF PROCEDURE:   The patient was identified in the preoperative holding area. The patient's right eye was confirmed as the surgical eye. It was then marked and dilated. The surgical procedure was reviewed with the patient and all questions answered. The informed consent was confirmed to be located in the chart.     The patient was taken to the operating room and placed under monitored anesthesia care (MAC) with IV sedation A retrobulbar block consisting of a mixture of 0.75% Bupivacaine and 200 units of Vitrase was administered in the usual fashion without any complication. The eye was prepped with double diluted 10% Betadine solution and then thoroughly irrigated and dried. A sterile ophthalmic drape was placed over the patient and a speculum was placed in the eye.      The 25-gauge trocar-cannula system was used to make sclerotomies 3.5-4 mm posterior to the limbus inferotemporally, superotemporally and superonasally.  The infusion line was inserted into the inferotemporal port and viewed through the pupil to ensure proper positioning in the vitreous cavity before being turned on. A complete core vitrectomy with peripheral shaving was performed. Triamolone was injected into the eye to better visualize the vitreous and a PVD was pulled. Double-dilute ICG was injected over the posterior pole. Care was taken so as not to  create a jet towards the fovea. The ICG was washed off after 30-60 seconds. The [employer] officer, community and [employer] were used to remove the epiretinal membrane and internal limiting membrane throughout the posterior pole.  Peripheral retinal examination with scleral indentation was performed and found to be satisfactory. A fluid air exchange was performed.      20% sf6 gas was then exchanged for air using the [employer]. The ports were removed and  closed with 7-0 vicryl.  Cefuroxime 50 mg and Dexamethasone 2 mg were injected into the subtenon space. The eye pressure was palpated and felt to be appropriate. The lid speculum was removed. TobraDex ophthalmic ointment, 2 patches and 1 shield were placed on the eye. Patient was taken to recovery in stable condition. The patient was directed that face down positioning was necessary.       Jacqlyn Larsen, MD 11/02/2021, 15:50        D/C Summary  Patient meets discharge criteria.   Discharge to home   Take all drops as prescribed  RTC as scheduled or sooner prn any problem.   Head Positioning: face down     Jacqlyn Larsen, MD 11/02/2021, 40:10        "I certify that the services for which payment is claimed were medically necessary and that no qualified resident was available to perform due to other clinical or educational responsibilities.    The assistant helped with bimanual scleral indentation with peripheral shaving, as  well as peeling of membranes when performed.  I further understand that these services are subject to post-payment review by the Medicare carrier. Advanced assistance was utilized in the portions of the case where sophisticated manipulation of the globe or the biomicroscopic visualization system was required for meeting the surgical objectives."  .  Con Memos, MD  11/03/2021, 12:58

## 2021-11-02 NOTE — OR PreOp (Signed)
Patient to 5W pre op.  Pre op procedures complete.  Family at bedside.

## 2021-11-03 ENCOUNTER — Ambulatory Visit: Payer: MEDICAID | Attending: Ophthalmology | Admitting: Ophthalmology

## 2021-11-03 ENCOUNTER — Encounter (INDEPENDENT_AMBULATORY_CARE_PROVIDER_SITE_OTHER): Payer: Self-pay | Admitting: Ophthalmology

## 2021-11-03 DIAGNOSIS — Z9889 Other specified postprocedural states: Secondary | ICD-10-CM | POA: Insufficient documentation

## 2021-11-03 NOTE — Progress Notes (Addendum)
OPHTHALMOLOGY, [place]  [place] [address]  Operated by [place]         Patient Name: [name]  MRN#: [id number]  [place]: 03-Oct-1956    Date of Service: 11/03/2021        [name] is a 66 y.o. female who presents today for evaluation/consultation of:  HPI    Hx: 11/02/2021  right eye  Pars plana vitrectomy, nternal limiting membrane (ILM) and epiretinal membrane (ERM) peel, Injection of 20%  sf6 gas, Subtenon's injection of Cefuroxime 50 mg and Dexamethasone 2 mg       PDRTC 1 day PO OD  Doing well  Doing Head positioning as directed   Has gtts awaiting instructions   Denies pain OD  " just soreness "  Pain last evening 00/10  Pain at this time 00/10    Last edited by Robley Fries, [place] on 11/03/2021  8:23 AM.        ROS    Positive for: Eyes (Right eye  11/02/2021 : Pars plana vitrectomy, nternal limiting membrane (ILM) and epiretinal membrane (ERM) peel, Injection of 20%  sf6 gas, Subtenon's injection of Cefuroxime 50 mg and Dexamethasone 2 mg)  Last edited by Robley Fries, [place] on 11/03/2021  8:23 AM.         All other systems Negative    Robley Fries, COA  11/03/2021, 08:26        Base Eye Exam     Visual Acuity (Snellen - Linear)       Right Left    Dist sc CF 10-12 in     Dist cc  20/25    Dist ph sc NI           Tonometry ([facility], 8:23 AM)       Right Left    Pressure 15 17          Neuro/Psych     Oriented x3: Yes    Mood/Affect: Normal          Dilation     Both eyes: already Dilated @ 8:26 AM            Slit Lamp and Fundus Exam     External Exam       Right Left    External Normal Normal          Slit Lamp Exam       Right Left    Lids/Lashes Normal Normal    Conjunctiva/Sclera White and quiet White and quiet    Cornea Clear Clear    Anterior Chamber Deep and quiet Deep and quiet    Iris Round and reactive Round and reactive    Lens Trace NSC Trace NSC    Vitreous s/p PPV, 75% SF6 Normal          Fundus Exam       Right Left    Disc Normal      Macula s/p peel, attached     Vessels Normal     Periphery attached                 MD Addition to HPI: Pt presents today one day post-op           ENCOUNTER DIAGNOSES     ICD-10-CM   1.  s/p PPV/FTMH repair/SF6 OD on 11/02/21  Z98.890     No orders of the defined types were placed in this encounter.  Ophthalmic Plan of Care:    POD #1 s/p PPV/FTMH repair/SF6 OD on 11/02/21  - drops reviewed - vigamox and predforte QID, cyclo TID  - positioning reviewed (face down), wear eye shield at night/sleeping, no heavy lifting or bending below the waist  - RD precautions given      Follow up:    I have asked [name] to follow up 1 week DFE OCT OD        I have seen and examined the above patient. I discussed the above diagnoses listed in the assessment and the above ophthalmic plan of care with the patient and patient's family. All questions were answered. I reviewed and, when necessary, made changes to the technician/resident note, documented ophthalmology exam, chief complaint, history of present illness, allergies, review of systems, past medical, past surgical, family and social history. I personally reviewed and interpreted all testing and/or imaging performed at this visit and agree with the resident's or fellow's interpretation. Any exceptions/additions are edited/noted in the relevant encounter fields.      I am scribing for, and in the presence of Dr. Gala Murdoch for services provided on 11/03/2021.  Loman [place], SCRIBE   [place], SCRIBE  11/03/2021, 09:00      Late entry for 11-03-21  When a scribe documentaion is present , I attest that I personally performed the services described in this documentation, as scribed  in my presence, and it is both accurate  and complete.  When a resident or fellow documentation is present, I attest that I saw and examined the patient.  I reviewed the resident's note.  I agree with the findings and plan of care as documented in the resident's note.  Any  exceptions/additions are edited/noted.  When performed, I also attest that I reviewed the image(s)  and testing(s)  and agree with the interpretation and report as documented by the resident/fellow.  Exceptions as noted.    Con Memos, MD

## 2021-11-10 ENCOUNTER — Encounter (INDEPENDENT_AMBULATORY_CARE_PROVIDER_SITE_OTHER): Payer: Self-pay | Admitting: Ophthalmology

## 2021-11-10 ENCOUNTER — Other Ambulatory Visit: Payer: Self-pay

## 2021-11-10 ENCOUNTER — Inpatient Hospital Stay (HOSPITAL_BASED_OUTPATIENT_CLINIC_OR_DEPARTMENT_OTHER)
Admission: RE | Admit: 2021-11-10 | Discharge: 2021-11-10 | Disposition: A | Discharge status: Home / Self Care | Payer: MEDICAID | Source: Ambulatory Visit

## 2021-11-10 ENCOUNTER — Ambulatory Visit: Payer: MEDICAID | Attending: Ophthalmology | Admitting: Ophthalmology

## 2021-11-10 DIAGNOSIS — Z9889 Other specified postprocedural states: Secondary | ICD-10-CM

## 2021-11-10 DIAGNOSIS — Z8669 Personal history of other diseases of the nervous system and sense organs: Secondary | ICD-10-CM

## 2021-11-10 DIAGNOSIS — H3589 Other specified retinal disorders: Secondary | ICD-10-CM

## 2021-11-10 NOTE — Progress Notes (Addendum)
[facility]  [place] [address]  Operated by [place]         Patient Name: [name]  MRN#: [id number]  [place]: June 19, 1956    Date of Service: 11/10/2021    Chief Complaint    Post-op [place] is a 66 y.o. female who presents today for evaluation/consultation of:  HPI    Hx: s/p PPV/FTMH repair/SF6 OD on 11/02/21    PDRTC: 1 week  for DFE, OCT OD .  Doing well, Vision starting to return slowly.  Tapering gtts as directed  Pain : 09/10 after first visit  lasting 2 days.   Pain at this time: 00/10  Getting Pred 1%  TiD   Red top Tid til gone  Last edited by Robley Fries, [place] on 11/10/2021  9:26 AM.        ROS    Positive for: Eyes (Hx: s/p PPV/FTMH repair/SF6 OD on 11/02/21)  Last edited by Robley Fries, [place] on 11/10/2021  9:21 AM.         All other systems Negative    Jobe Spicer, COA  11/10/2021, 09:27        Base Eye Exam     Visual Acuity (Snellen - Linear)       Right Left    Dist cc 20/60 20/25    Dist ph cc 20/50     Correction: Glasses          Tonometry ([facility], 9:25 AM)       Right Left    Pressure 13.7 .12.5          Pupils       Dark Light Shape React APD    Right 7.5        Left 4 3 Round Brisk None          Extraocular Movement       Right Left     Full, Ortho Full, Ortho          Neuro/Psych     Oriented x3: Yes    Mood/Affect: Normal                MD Addition to HPI: Pt presents today for postop.           ENCOUNTER DIAGNOSES     ICD-10-CM   1. Post-operative state  Z98.890     Orders Placed This Encounter   Procedures   . OPH OCT BI       Ophthalmic Plan of Care:    POW #1 s/p PPV/FTMH repair/SF6 OD on 11/02/21  - mac hole closed today with persistent outer retinal irregularities   - drops reviewed - discontinue vigamox and taper predforte, cyclo TID until empty   - RD precautions given    Follow up:    I have asked Prabhleen Montemayor to follow up 6 weeks DFE OCT OU           I have seen and examined the above  patient. I discussed the above diagnoses listed in the assessment and the above ophthalmic plan of care with the patient and patient's family. All questions were answered. I reviewed and, when necessary, made changes to the technician/resident note, documented ophthalmology exam, chief complaint, history of present illness, allergies, review of systems, past medical, past surgical, family and social history. I personally reviewed and interpreted all testing and/or imaging performed  at this visit and agree with the resident's or fellow's interpretation. Any exceptions/additions are edited/noted in the relevant encounter fields.    I am scribing for, and in the presence of Dr. Gala Murdoch for services provided on 11/10/2021.  Loman [place], SCRIBE   [place], [place]  11/10/2021, 10:02    When a scribe documentaion is present , I attest that I personally performed the services described in this documentation, as scribed  in my presence, and it is both accurate  and complete.  When a resident or fellow documentation is present, I attest that I saw and examined the patient.  I reviewed the resident's note.  I agree with the findings and plan of care as documented in the resident's note.  Any exceptions/additions are edited/noted.  When performed, I also attest that I reviewed the image(s)  and testing(s)  and agree with the interpretation and report as documented by the resident/fellow.  Exceptions as noted.    Con Memos, MD

## 2021-11-16 ENCOUNTER — Ambulatory Visit (INDEPENDENT_AMBULATORY_CARE_PROVIDER_SITE_OTHER): Payer: Self-pay | Admitting: Ophthalmology

## 2021-11-16 NOTE — Telephone Encounter (Signed)
Regarding: [name]  ----- Message from Fredderick Severance sent at 11/16/2021 11:20 AM EST -----  Pt called to know what was going to happen at her appointment tomorrow and if she needs a driver

## 2021-11-16 NOTE — Telephone Encounter (Signed)
Spoke to pt. Advised per chart note. 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

## 2021-11-17 ENCOUNTER — Inpatient Hospital Stay (HOSPITAL_COMMUNITY)
Admission: RE | Admit: 2021-11-17 | Discharge: 2021-11-17 | Disposition: A | Discharge status: Home / Self Care | Payer: MEDICAID | Source: Ambulatory Visit

## 2021-11-17 ENCOUNTER — Ambulatory Visit: Payer: MEDICAID | Attending: Ophthalmology | Admitting: Ophthalmology

## 2021-11-17 ENCOUNTER — Other Ambulatory Visit: Payer: Self-pay

## 2021-11-17 DIAGNOSIS — H2513 Age-related nuclear cataract, bilateral: Secondary | ICD-10-CM | POA: Insufficient documentation

## 2021-11-17 DIAGNOSIS — H0015 Chalazion left lower eyelid: Secondary | ICD-10-CM

## 2021-11-17 DIAGNOSIS — Z9889 Other specified postprocedural states: Secondary | ICD-10-CM | POA: Insufficient documentation

## 2021-11-17 DIAGNOSIS — H35341 Macular cyst, hole, or pseudohole, right eye: Secondary | ICD-10-CM | POA: Insufficient documentation

## 2021-11-17 DIAGNOSIS — H119 Unspecified disorder of conjunctiva: Secondary | ICD-10-CM | POA: Insufficient documentation

## 2021-11-17 DIAGNOSIS — H01009 Unspecified blepharitis unspecified eye, unspecified eyelid: Secondary | ICD-10-CM | POA: Insufficient documentation

## 2021-11-17 NOTE — Progress Notes (Addendum)
[facility]  [place] [address]  Operated by [facility]    11/17/2021    Patient Name: [name]    Date of Birth:  10-29-1955    Referring Provider:  Alvis Lemmings, PA-C  [place],  [place] [address]    Ophthalmologist/Optometrist:      Chief Complaint    Follow Up         HPI     Follow Up     Additional comments: Pt here for a 1 month f/u for chalazion for LLL           Comments    Pt states that the chalazion has not gone away or gotten any better   Pt denies any changes to the eye irritation and watering   Pt reports that for the most part vision in OS has been stable, it will blur occasionally whenever the her eye waters   Pt using warm compresses and emycin           Last edited by Darrel Hoover, [place] on 11/17/2021 11:21 AM.          ROS    Positive for: Eyes  Negative for: Constitutional, Gastrointestinal, Neurological, Skin, Genitourinary, Musculoskeletal, HENT, Endocrine, Cardiovascular, Respiratory, Psychiatric, Allergic/Imm, Heme/Lymph  Last edited by Darrel Hoover, [place] on 11/17/2021 11:21 AM.          History of Sun Exposure:no If yes:   Excessive sun burn: no  History radiation to head: no  History of skin cancer:  Yes      Past Ocular history:   Past Surgical History:   Procedure Laterality Date   . COLONOSCOPY N/A 07/23/2019    Performed by Virgel Manifold, MD at [facility]   . COLONOSCOPY N/A 01/06/2009    Performed by Tama High, MD at [facility]   . COLONOSCOPY WITH BIOPSY N/A 07/23/2019    Performed by Virgel Manifold, MD at [facility]   . COLONOSCOPY WITH POLYPECTOMY N/A 07/23/2019    Performed by Virgel Manifold, MD at [facility]   . CYSTOSCOPY N/A 09/24/2009    Performed by Domingo Sep, MD at [facility]   . CYSTOSCOPY WITH CYSTOMETROGRAM AND URODYNAMICS N/A 05/01/2018    Performed by Thurmon Fair, MD at [facility]   . EYELID RETRACTION REPAIR WITH EAR  CARTILAGE GRAFT Right 08/10/2016    Performed by [name], MD at [facility]   . EYELID RETRACTION REPAIR WITH EAR CARTILAGE GRAFT Bilateral 01/28/2015    Performed by [name], MD at [facility]   . GASTROSCOPY Left 03/05/2010    Performed by Jocelyn Lamer, MD at [facility]   . GASTROSCOPY Left 12/19/2007    Performed by Laurine Blazer, MD at [facility]   . GASTROSCOPY WITH BIOPSY Left 03/05/2010    Performed by Jocelyn Lamer, MD at [facility]   . HEMITHYROIDECTOMY Right 04/05/2019    Performed by Delfino Lovett, MD at [facility]   . HX ADENOIDECTOMY     . HX ANKLE FRACTURE TX  2007    left distal fibula, casted   . HX APPENDECTOMY     . HX CHOLECYSTECTOMY     . HX COLONOSCOPY     . HX CYSTOCELE REPAIR  09/24/2009   . HX HAND SURGERY  2012    for Carpal tunnel : right side treated   . HX HEART CATHETERIZATION     . HX OOPHORECTOMY      left ovary removed   . HX PAROTIDECTOMY       "surgical clamp"    . HX PARTIAL THYROIDECTOMY Right 04/05/2019    R hemithyroidectomy for nodule, final path follicular adenoma   . HX TONSILLECTOMY     . HX TOTAL VAGINAL HYSTERECTOMY  1979   . HX WRIST FRACTURE [place]  2007    [place] injury   . INJECTION EXCHANGE GAS Right 11/02/2021    Performed by Con Memos, MD at [facility] N/A 01/03/2008    Performed by Laurine Blazer, MD at [facility]   . PAROTIDECTOMY SUPERFICIAL WITH FACIAL NERVE DISSECTION Right 02/06/2015    Performed by Delfino Lovett, MD at [facility]   . PARS PLANA VITRECTOMY Right 11/02/2021    Pars plana vitrectomy, nternal limiting membrane (ILM) and epiretinal membrane (ERM) peel, Injection of 20%  sf6 gas, Subtenon's injection of Cefuroxime 50 mg and Dexamethasone 2 mg   . PB REVISE ULNAR NERVE AT ELBOW Left 1979   . PB UPPER GI ENDOSCOPY,BIOPSY  12/19/2007    patulous GE junction zone, erythema, nonerosive GERD   . [place] PROBE N/A 01/03/2008    Performed by Laurine Blazer, MD at [facility]    . RELEASE CARPAL TUNNEL Right 11/30/2010    Performed by Stoney Bang, MD at [facility]   . REPAIR ANTERIOR AND POSTERIOR N/A 09/24/2009    Performed by Domingo Sep, MD at [facility]   . REPAIR ECTROPION BILATERAL Bilateral 01/28/2015    Performed by [name], MD at [facility]   . SUSPENSION VAGINAL VAULT N/A 09/24/2009    Performed by Domingo Sep, MD at [facility]   . VAGINECTOMY PARTIAL N/A 09/24/2009    Performed by Domingo Sep, MD at [facility]   . VITRECTOMY POSTERIOR WITH MEMBRANE PEEL Right 11/02/2021    Performed by Con Memos, MD at [facility] [place], [place] 11/17/2021, 15:26    MD Addition to HPI:    The patient is a 66 y.o. female here with complaint of:    RPV, 1 month f/u for Chalazion LLL. Pt has been using warm compresses and maxitrol TID without relief. Pt reports that he had full thickness mac hole repair with Dr. Darnell Level on 11/02/2021 with injection of SF 6 gas. Endorses stable VA, but notes OS will blur occasionally. Denies new FOL/floaters, diplopia, HA, and pain. Pt notes the stye has not resolved and would like to disc removal.      Assessment     Impression(s):      ICD-10-CM    1. Chalazion of left lower eyelid  H00.15 OPH EXTERNAL PHOTOS      2.  s/p PPV/FTMH repair/SF6 OD on 11/02/21  Z98.890       3. Blepharitis  H01.009       4. Conjunctival lesion  H11.9       5. Macular hole of right eye  H35.341       6. Nuclear sclerotic cataract of both eyes  H25.13  Plan(s)/Recommendation(s):    Chalazion LLL blepharitis with Caruncle lesions L  - cont WC PRN  - disc r/b/a of LLL chalazion excision - pt understands and consents     [place] OU  - not visually significant  - cont to monitor and follow with Dr. Lucia Gaskins    Hx of macular hole OD s/p repair  -continue to follow with Dr. Darnell Level     Reviewed risks, benefits and alternatives of Chalazion excision LLL. Potential risks include pain, bleeding, infection, failure of operation, need for  further procedures. Pt understands and elects/desires for surgery     SURGERY    Procedure: LLL chalazion excision and L caruncle lesion excision in [facility] (12/22/21)    Diagnosis:     ICD-10-CM    1. Chalazion of left lower eyelid  H00.15 OPH EXTERNAL PHOTOS      2.  s/p PPV/FTMH repair/SF6 OD on 11/02/21  Z98.890       3. Blepharitis  H01.009       4. Conjunctival lesion  H11.9       5. Macular hole of right eye  H35.341       6. Nuclear sclerotic cataract of both eyes  H25.13           Anesthesia: LOCAL    Length of time: 15 minutes    Special Needs:    Risks, benefits, and alternatives explained.    Pt understands and elects to proceed with surgery/desires for surgery  Imaging (CT/MRI) Reviewed:   Results Reviewed:       I personally performed the services described in this documentation, as scribed  in my presence, and it is both accurate  and complete.  [name], MD 11/17/2021  15:26    I am scribing for, and in the presence of Dr. Alfonse Spruce for services provided on 11/17/2021.  [name], SCRIBE   [name], [place]  11/17/2021, 11:59    I saw and examined the patient.  I reviewed the technician/resident's note.  I agree with the findings and plan of care as documented in the resident's note.  Any exceptions/additions are edited/noted.    [name], MD 11/17/2021  15:26      Eye Examination:     Neuro/Psych     Oriented x3: Yes    Mood/Affect: Normal        Visual Acuity     Visual Acuity (Snellen - Linear)       Right Left    Dist cc 20/60 -2 20/30    Dist ph cc NI     Correction: Glasses          Edited by: Darrel Hoover, COA            Not recorded       Not recorded       Not recorded       Confrontational Visual Fields     Visual Fields (Counting fingers)       Right Left     Full Full          Edited by: Darrel Hoover, COA            Pupils       Shape React APD    Right Round Minimal None    Left Round Slow None        Extraocular Movement     Extraocular Movement       Right Left     Full, Ortho  Full,  Philomena Doheny by: Darrel Hoover, COA                               Sensation   V1: Intact bilaterally   V2: Intact bilaterally   V3: Intact bilaterally    Facial Contours:     H[place]el:                            OD:                            OS:                            Base:    Eyelids:    Bell's:      Lid edema VJ:KQAS OS: None Conj chem OD: None UO:RVIF   Lid inject OD: None OS: None Conj inject OD: None OS: None             PF   MRD                       LF   LC                       USS   LSS                    LAG            Main Ophthalmology Exam     External Exam       Right Left    External Normal Normal          Slit Lamp Exam       Right Left    Lids/Lashes Normal caruncular cyst, lower lid post lamella chalazia with small cysts and whtiish appearance    Conjunctiva/Sclera injection White and quiet    Cornea Clear Clear    Anterior Chamber Deep and quiet Deep and quiet    Iris Round and reactive Round and reactive    Lens Trace NSC Trace NSC    Anterior Vitreous s/p PPV, 50% Normal          Fundus Exam       Right Left    Disc Normal     Macula s/p peel, attached     Vessels Normal     Periphery attached                 IOP straight Tonometry     Tonometry (Tonopen, 11:31 AM)       Right Left    Pressure 9 11          Edited by: Darrel Hoover, [place]     5 down      Past Medical / Surgical / Family / Social History:      has a past medical history of Anxiety, Arthritis, Cancer (CMS [place]), Chronic pain, Depression, Dyspnea on exertion, Essential hypertension (02/16/2017), GERD (gastroesophageal reflux disease), Headache, Heart murmur (1979), Hyperlipidemia LDL goal < 100 (08/29/2013), MINOR CAD (coronary artery disease) (11/10/2012), Multinodular goiter, Muscle weakness, Obesity, Peripheral edema, Rash, Rheumatic fever, S/P thyroidectomy, Squamous cell carcinoma (02/06/2015),  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    She has no past medical history of Allergic rhinitis, Anemia, Anesthesia complication, Angina at rest (CMS [facility]), Angina pectoris (CMS [facility]), Aortic aneurysm (CMS HCC), Asthma, Asthma, Automatic implantable cardioverter-defibrillator in situ, Awareness under anesthesia, Back problem, BiPAP (biphasic positive airway pressure) dependence, Blood transfusion, without reported diagnosis, Breast CA (CMS HCC), Bruises easily, Cataract, Cervical cancer (CMS HCC), Chronic bronchitis (CMS HCC), Chronic obstructive airway disease (CMS HCC), Clotting disorder (CMS HCC), Cognitive dysfunction, Colon cancer (CMS HCC), Congenital anomaly of heart, Congestive heart failure, unspecified, COPD (chronic obstructive pulmonary disease) (CMS HCC), CPAP (continuous positive airway pressure) dependence, CVA (cerebrovascular accident) (CMS HCC), Deep vein thrombosis (DVT) (CMS [place]), Diabetes, Diabetes mellitus (CMS [place]), Diabetes mellitus type II, Diabetes mellitus, type 1, Diabetes mellitus, type 2 (CMS HCC), Difficult intubation, Difficult intubation, Difficulty waking, Disorder of liver, Dysrhythmias, Endometrial cancer (CMS HCC), Glaucoma, H/O hearing loss, H/O urinary tract infection, Hard to intubate, Hearing loss, Hepatic cirrhosis (CMS HCC), Hepatitis A virus infection, HTN, breast cancer, coronary artery bypass graft, transfusion, Hyperthyroidism, Hypothyroid, Kidney disease, Loss of weight, Malignant hyperthermia, MI (myocardial infarction) (CMS HCC), Myocardial infarction (CMS HCC), Nausea with vomiting, Neck problem, Other convulsions, Ovarian cancer (CMS [place]), Pacemaker, Palpitations, Panic attack, Personal history of irradiation, Personal history of malignant neoplasm of prostate, PONV (postoperative nausea and vomiting), PONV (postoperative nausea and vomiting), Problems with swallowing, Pseudocholinesterase deficiency, Pulmonary embolism (CMS HCC), Pulmonary emphysema (CMS HCC), Renal failure, Seizure (CMS HCC),  Seizures, Shortness of breath, Sleep apnea, Spinal headache, STD (sexually transmitted disease), Stented coronary artery, Stroke (CMS HCC), Thyroid disorder, Treatment, Type 2 diabetes mellitus, Type 2 diabetes mellitus (CMS [place]), Type I diabetes mellitus (CMS [place]), Upper respiratory infection, Uterine cancer (CMS [place]), Valvular disease, Viral hepatitis B, or Viral hepatitis C.    Past Surgical History:   Procedure Laterality Date   . COLONOSCOPY  01/06/2009    COLONOSCOPY performed by Loletta Parish F at [facility]   . GASTROSCOPY  03/05/2010    GASTROSCOPY performed by Jocelyn Lamer at [facility]   . GASTROSCOPY WITH BIOPSY  03/05/2010    GASTROSCOPY WITH BIOPSY performed by Jocelyn Lamer at [facility]   . HX ADENOIDECTOMY     . HX ANKLE FRACTURE TX  2007    left distal fibula, casted   . HX APPENDECTOMY     . HX CHOLECYSTECTOMY     . HX COLONOSCOPY     . HX CYSTOCELE REPAIR  09/24/2009   . HX HAND SURGERY  2012    for Carpal tunnel : right side treated   . HX HEART CATHETERIZATION     . HX OOPHORECTOMY      left ovary removed   . HX PAROTIDECTOMY       "surgical clamp"    . HX PARTIAL THYROIDECTOMY Right 04/05/2019    R hemithyroidectomy for nodule, final path follicular adenoma   . HX TONSILLECTOMY     . HX TOTAL VAGINAL HYSTERECTOMY  1979   . HX WRIST FRACTURE [place]  2007    [place] injury   . PARS PLANA VITRECTOMY Right 11/02/2021    Pars plana vitrectomy, nternal limiting membrane (ILM) and epiretinal membrane (ERM) peel, Injection of 20%  sf6 gas, Subtenon's injection of Cefuroxime 50 mg and Dexamethasone 2 mg   . PB REVISE ULNAR NERVE AT ELBOW Left 1979   . PB UPPER GI ENDOSCOPY,BIOPSY  12/19/2007  patulous GE junction zone, erythema, nonerosive GERD           Family Medical History:     Problem Relation (Age of Onset)    Bipolar Disorder Daughter, Paternal Uncle    Breast Cancer Paternal Aunt, Paternal Aunt, Paternal Aunt, Paternal Aunt, Paternal Aunt, Other    Cancer Paternal Aunt, Paternal 73, Paternal  29, Paternal 44, Paternal 23, Other (7)    Congestive Heart Failure Father (30)    Coronary Artery Disease Father, Mother (65)    Diabetes Mother    Heart Attack Father, Sister    High Cholesterol Mother    Hypertension (High Blood Pressure) Mother, Brother, Sister, Sister    Kidney Disease Sister    Leukemia Paternal Uncle    Stroke Paternal Grandmother    Thyroid Disease Sister            Social History     Tobacco Use   . Smoking status: Former     Packs/day: 2.00     Years: 40.00     Pack years: 80.00     Types: Cigarettes     Quit date: 01/22/2005     Years since quitting: 16.8   . Smokeless tobacco: Never   Vaping Use   . Vaping Use: Never used   Substance Use Topics   . Alcohol use: No     Alcohol/week: 0.0 standard drinks   . Drug use: No       I have seen and examined the above patient. I discussed the above diagnoses listed in the assessment and the above ophthalmic plan of care with the patient and patient's family. All questions were answered. I reviewed and, when necessary, made changes to the technician/resident note, documented ophthalmology exam, chief complaint, history of present illness, allergies, review of systems, past medical, past surgical, family and social history.    [name], MD 11/17/2021  15:26

## 2021-11-26 ENCOUNTER — Encounter (HOSPITAL_COMMUNITY): Payer: MEDICAID | Admitting: Student in an Organized Health Care Education/Training Program

## 2021-11-28 ENCOUNTER — Encounter (INDEPENDENT_AMBULATORY_CARE_PROVIDER_SITE_OTHER): Payer: Self-pay | Admitting: Ophthalmology

## 2021-12-22 ENCOUNTER — Inpatient Hospital Stay (HOSPITAL_COMMUNITY)
Admission: RE | Admit: 2021-12-22 | Discharge: 2021-12-22 | Disposition: A | Discharge status: Home / Self Care | Payer: MEDICAID | Source: Ambulatory Visit

## 2021-12-22 ENCOUNTER — Ambulatory Visit (HOSPITAL_BASED_OUTPATIENT_CLINIC_OR_DEPARTMENT_OTHER): Payer: MEDICAID | Admitting: Ophthalmology

## 2021-12-22 ENCOUNTER — Ambulatory Visit: Payer: MEDICAID | Attending: Ophthalmology | Admitting: Ophthalmology

## 2021-12-22 ENCOUNTER — Encounter (INDEPENDENT_AMBULATORY_CARE_PROVIDER_SITE_OTHER): Payer: Self-pay | Admitting: Ophthalmology

## 2021-12-22 ENCOUNTER — Other Ambulatory Visit: Payer: Self-pay

## 2021-12-22 ENCOUNTER — Inpatient Hospital Stay (HOSPITAL_BASED_OUTPATIENT_CLINIC_OR_DEPARTMENT_OTHER)
Admission: RE | Admit: 2021-12-22 | Discharge: 2021-12-22 | Disposition: A | Discharge status: Home / Self Care | Payer: MEDICAID | Source: Ambulatory Visit

## 2021-12-22 DIAGNOSIS — Z9889 Other specified postprocedural states: Secondary | ICD-10-CM

## 2021-12-22 DIAGNOSIS — H119 Unspecified disorder of conjunctiva: Secondary | ICD-10-CM | POA: Insufficient documentation

## 2021-12-22 DIAGNOSIS — H029 Unspecified disorder of eyelid: Secondary | ICD-10-CM

## 2021-12-22 DIAGNOSIS — H0015 Chalazion left lower eyelid: Secondary | ICD-10-CM

## 2021-12-22 DIAGNOSIS — H3581 Retinal edema: Secondary | ICD-10-CM

## 2021-12-22 DIAGNOSIS — H0289 Other specified disorders of eyelid: Secondary | ICD-10-CM

## 2021-12-22 DIAGNOSIS — H1189 Other specified disorders of conjunctiva: Secondary | ICD-10-CM

## 2021-12-22 DIAGNOSIS — H3589 Other specified retinal disorders: Secondary | ICD-10-CM

## 2021-12-22 HISTORY — PX: CHALAZION EXCISION: SHX213

## 2021-12-22 NOTE — Progress Notes (Addendum)
[facility]  [place] [address]  Operated by [facility]         Patient Name: [name]  MRN#: [id number]  [place]: 25-Aug-1956    Date of Service: 12/22/2021    Chief Complaint    Follow Up - Retina Problem         Keir Foland is a 66 y.o. female who presents today for evaluation/consultation of:  HPI    Follow up 6 weeks DFE OCT OU   POW #2 s/p PPV/FTMH repair/SF6 OD on 11/02/21    Pt states; VA OD has improved. She doesn't have any floaters. Denies any flashes. Some soreness at the corner of OD. OS tears a lot throughout the day and VA becomes a blur.     Last edited by Tessie Fass on 12/22/2021  9:09 AM.        ROS    Positive for: Eyes  Negative for: Constitutional, Gastrointestinal, Neurological, Skin, Genitourinary, Musculoskeletal, HENT, Endocrine, Cardiovascular, Respiratory, Psychiatric, Allergic/Imm, Heme/Lymph  Last edited by Tessie Fass on 12/22/2021  9:05 AM.         All other systems Negative    Sophronia Simas Bog[place]  12/22/2021, 09:09        Base Eye Exam     Visual Acuity (Snellen - Linear)       Right Left    Dist cc 20/80 20/25    Dist ph cc 20/50 -1     Correction: Glasses          Tonometry (Tonopen, 9:17 AM)       Right Left    Pressure 13 14          Pupils       Pupils APD    Right PERRL None    Left PERRL None          Visual Fields (Counting fingers)       Right Left     Full Full          Extraocular Movement       Right Left     Full, Ortho Full, Ortho          Neuro/Psych     Oriented x3: Yes    Mood/Affect: Normal          Dilation     Both eyes: 2.5% Phenylephrine, 0.5% Proparacaine, 1.0% Mydriacyl @ 9:17 AM            Slit Lamp and Fundus Exam     External Exam       Right Left    External Normal Normal          Slit Lamp Exam       Right Left    Lids/Lashes Normal chalazion LL    Conjunctiva/Sclera White and quiet White and quiet    Cornea Clear Clear    Anterior Chamber Deep and quiet Deep  and quiet    Iris Round and reactive Round and reactive    Lens trace NS trace NS    Vitreous Normal Normal          Fundus Exam       Right Left    Disc Normal Normal    C/D Ratio 0.35 0.3    Macula s/p peel, attached Normal    Vessels Normal Normal    Periphery Normal Normal  MD Addition to HPI: Pt presents postop. She notes vision improved in the right eye.           ENCOUNTER DIAGNOSES     ICD-10-CM   1. Hx of vitrectomy  Z98.890     Orders Placed This Encounter   Procedures   . OPH OCT BI       Ophthalmic Plan of Care:    POM #1 s/p PPV/FTMH repair/SF6 OD on 11/02/21  - mac hole closed today with persistent outer retinal irregularities   - if subfoveal irregularities/SRF progress--consider Trusopt   - will continue to monitor     Follow up:    I have asked Reyne Falconi to follow up 6 weeks DFE OCT OU          I have seen and examined the above patient. I discussed the above diagnoses listed in the assessment and the above ophthalmic plan of care with the patient and patient's family. All questions were answered. I reviewed and, when necessary, made changes to the technician/resident note, documented ophthalmology exam, chief complaint, history of present illness, allergies, review of systems, past medical, past surgical, family and social history. I personally reviewed and interpreted all testing and/or imaging performed at this visit and agree with the resident's or fellow's interpretation. Any exceptions/additions are edited/noted in the relevant encounter fields.    I am scribing for, and in the presence of Dr. Gala Murdoch for services provided on 12/22/2021.  Loman Brooklyn, SCRIBE   [place], [place]  12/22/2021, 09:54        Late entry for 12/22/21  When a scribe documentaion is present , I attest that I personally performed the services described in this documentation, as scribed  in my presence, and it is both accurate  and complete.  When a resident or fellow documentation is present, I  attest that I saw and examined the patient.  I reviewed the resident's note.  I agree with the findings and plan of care as documented in the resident's note.  Any exceptions/additions are edited/noted.  When performed, I also attest that I reviewed the image(s)  and testing(s)  and agree with the interpretation and report as documented by the resident/fellow.  Exceptions as noted.    Con Memos, MD

## 2021-12-22 NOTE — Progress Notes (Addendum)
[facility]  [place] [address]  Operated by [facility]    12/22/2021    Patient Name: [name]    Date of Birth:  27-Oct-1955    Referring Provider:  Alvis Lemmings, PA-C  [place]  [place],  [place] [address]    Ophthalmologist/Optometrist:      Chief Complaint    Follow Up         HPI     Follow Up     Additional comments: Chalazion LLL           Comments    Pt here for f/u   Pt states that the bump is now bigger   Pt notes that there are 3 bumps now  Pt notes VA gets blurry OS  Denies gtt  Pt notes tearing and discharge  Pt has been trying WC for the last 6wks           Last edited by Matilde Sprang, [place] on 12/22/2021 10:08 AM.          ROS    Positive for: Eyes (Chalazion of left lower eyelid)  Negative for: Constitutional, Gastrointestinal, Neurological, Skin, Genitourinary, Musculoskeletal, HENT, Endocrine, Cardiovascular, Respiratory, Psychiatric, Allergic/Imm, Heme/Lymph  Last edited by Matilde Sprang, [place] on 12/22/2021 10:08 AM.          History of Sun Exposure:no If yes:   Excessive sun burn:   History radiation to head:   History of skin cancer:        Past Ocular history:   Past Surgical History:   Procedure Laterality Date   . COLONOSCOPY N/A 07/23/2019    Performed by Virgel Manifold, MD at [facility]   . COLONOSCOPY N/A 01/06/2009    Performed by Tama High, MD at [facility]   . COLONOSCOPY WITH BIOPSY N/A 07/23/2019    Performed by Virgel Manifold, MD at [facility]   . COLONOSCOPY WITH POLYPECTOMY N/A 07/23/2019    Performed by Virgel Manifold, MD at [facility]   . CYSTOSCOPY N/A 09/24/2009    Performed by Domingo Sep, MD at [facility] [place]   . CYSTOSCOPY WITH CYSTOMETROGRAM AND URODYNAMICS N/A 05/01/2018    Performed by Thurmon Fair, MD at [facility]   . EYELID RETRACTION REPAIR WITH EAR CARTILAGE GRAFT Right 08/10/2016    Performed by [name], MD at  [facility]   . EYELID RETRACTION REPAIR WITH EAR CARTILAGE GRAFT Bilateral 01/28/2015    Performed by [name], MD at [facility]   . GASTROSCOPY Left 03/05/2010    Performed by Jocelyn Lamer, MD at [employer]   . GASTROSCOPY Left 12/19/2007    Performed by Laurine Blazer, MD at [facility]   . GASTROSCOPY WITH BIOPSY Left 03/05/2010    Performed by Jocelyn Lamer, MD at [facility]   . HEMITHYROIDECTOMY Right 04/05/2019    Performed by Delfino Lovett, MD at [facility]   . HX ADENOIDECTOMY     . HX ANKLE FRACTURE [place]  2007    left distal fibula, casted   . HX APPENDECTOMY     . HX CHOLECYSTECTOMY     . HX COLONOSCOPY     . HX CYSTOCELE REPAIR  09/24/2009   . HX HAND SURGERY  2012  for Carpal tunnel : right side treated   . HX HEART CATHETERIZATION     . HX OOPHORECTOMY      left ovary removed   . HX PAROTIDECTOMY       "surgical clamp"    . HX PARTIAL THYROIDECTOMY Right 04/05/2019    R hemithyroidectomy for nodule, final path follicular adenoma   . HX TONSILLECTOMY     . HX TOTAL VAGINAL HYSTERECTOMY  1979   . HX WRIST FRACTURE [place]  2007    [place] injury   . INJECTION EXCHANGE GAS Right 11/02/2021    Performed by Con Memos, MD at [facility] N/A 01/03/2008    Performed by Laurine Blazer, MD at [facility]   . PAROTIDECTOMY SUPERFICIAL WITH FACIAL NERVE DISSECTION Right 02/06/2015    Performed by Delfino Lovett, MD at [facility]   . PARS PLANA VITRECTOMY Right 11/02/2021    Pars plana vitrectomy, nternal limiting membrane (ILM) and epiretinal membrane (ERM) peel, Injection of 20%  sf6 gas, Subtenon's injection of Cefuroxime 50 mg and Dexamethasone 2 mg   . PB REVISE ULNAR NERVE AT ELBOW Left 1979   . PB UPPER GI ENDOSCOPY,BIOPSY  12/19/2007    patulous GE junction zone, erythema, nonerosive GERD   . [place] PROBE N/A 01/03/2008    Performed by Laurine Blazer, MD at [facility]   . RELEASE CARPAL TUNNEL Right 11/30/2010    Performed by Stoney Bang, MD at [facility]   . REPAIR ANTERIOR AND POSTERIOR N/A 09/24/2009    Performed by Domingo Sep, MD at [facility]   . REPAIR ECTROPION BILATERAL Bilateral 01/28/2015    Performed by [name], MD at [facility]   . SUSPENSION VAGINAL VAULT N/A 09/24/2009    Performed by Domingo Sep, MD at [facility]   . VAGINECTOMY PARTIAL N/A 09/24/2009    Performed by Domingo Sep, MD at [facility]   . VITRECTOMY POSTERIOR WITH MEMBRANE PEEL Right 11/02/2021    Performed by Con Memos, MD at [facility] [place], [place] 12/22/2021, 15:35    MD Addition to HPI:    The patient is a 66 y.o. female here with complaint of:    RPV, returning for chalazion LLL. Pt has been doing well since the last visit. Pt reports the stye has gotten bigger and notes a concern of 3 new spots. Endorses blurry VA OS, discharge, and tearing. Denies new FOL/floaters, diplopia, HA, and pain.     Assessment     Impression(s):      ICD-10-CM    1. Chalazion of left lower eyelid  H00.15 OPH EXTERNAL PHOTOS     67800 - EXCISION OF CHALAZION; SINGLE (AMB ONLY)     SURGICAL PATHOLOGY SPECIMEN     SURGICAL PATHOLOGY SPECIMEN          Plan(s)/Recommendation(s):    Chalazion LLLblepharitis withCaruncle lesions L  - cont WC PRN  - disc r/b/a of LLL chalazion excision - consent obtained last visit  - removed chalazion's in clinic today    [place] OU  - not visually significant  - cont to monitor and follow with Dr. Lucia Gaskins    Hx of macular hole OD s/p repair  -continue to follow with Dr. Darnell Level     Imaging (  CT/MRI) Reviewed:   Results Reviewed:       I personally performed the services described in this documentation, as scribed  in my presence, and it is both accurate  and complete.  [name], MD 12/22/2021  15:35    I am scribing for, and in the presence of Dr. Alfonse Spruce for services provided on 12/22/2021.  [name], SCRIBE   [name], [place]  12/22/2021, 10:15    I saw and examined the patient.  I reviewed the  technician/resident's note.  I agree with the findings and plan of care as documented in the resident's note.  Any exceptions/additions are edited/noted.    [name], MD 12/22/2021  15:35      Eye Examination:     Neuro/Psych     Oriented x3: Yes    Mood/Affect: Normal        Visual Acuity     Visual Acuity (Snellen - Linear)       Right Left    Dist cc 20/80 20/25    Dist ph cc 20/50 -1     Correction: Glasses   Exam transposed from Dr Darnell Level visit earlier today  Matilde Sprang, COA  12/22/2021, 09:48             Edited by: Matilde Sprang, COA            Wearing Rx     Wearing Rx       Sphere Cylinder Axis Add    Right +2.25 +1.75 173 +2.50    Left +1.50 +0.50 013 +2.50          Edited by: Matilde Sprang, COA            Not recorded       Not recorded       Confrontational Visual Fields     Visual Fields (Counting fingers)       Right Left     Full Full          Edited by: Matilde Sprang, COA            Pupils       Pupils APD    Right PERRL None    Left PERRL None        Extraocular Movement     Extraocular Movement       Right Left     Full, Ortho Full, Ortho          Edited by: Matilde Sprang, COA                               Sensation   V1: Intact bilaterally   V2: Intact bilaterally   V3: Intact bilaterally    Facial Contours:     Hertel:                            OD:                            OS:                            Base:    Eyelids:    Bell's:      Lid edema DX:AJOI OS: None Conj chem OD: None NO:MVEH   Lid inject OD: None OS: None Conj inject OD:  None OS: None             PF   MRD                       LF   LC                       USS   LSS                    LAG            Main Ophthalmology Exam     External Exam       Right Left    External Normal Normal          Slit Lamp Exam       Right Left    Lids/Lashes Normal chalazion LL    Conjunctiva/Sclera White and quiet White and quiet    Cornea Clear Clear    Anterior Chamber Deep and quiet Deep and quiet    Iris Round and reactive Round and  reactive    Lens trace NS trace NS    Anterior Vitreous Normal Normal                IOP straight Tonometry     Tonometry (Tonopen, 9:49 AM)       Right Left    Pressure 13 14          Edited by: Matilde Sprang, COA                Upgaze     5 down      Past Medical / Surgical / Family / Social History:      has a past medical history of Anxiety, Arthritis, Cancer (CMS [place]), Chronic pain, Depression, Dyspnea on exertion, Essential hypertension (02/16/2017), GERD (gastroesophageal reflux disease), Headache, Heart murmur (1979), Hyperlipidemia LDL goal < 100 (08/29/2013), MINOR CAD (coronary artery disease) (11/10/2012), Multinodular goiter, Muscle weakness, Obesity, Peripheral edema, Rash, Rheumatic fever, S/P thyroidectomy, Squamous cell carcinoma (63/33/5456), 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    She has no past medical history of Allergic rhinitis, Anemia, Anesthesia complication, Angina at rest (CMS [facility]), Angina pectoris (CMS [facility]), Aortic aneurysm (CMS [place]), Asthma, Asthma, Automatic implantable cardioverter-defibrillator in situ, Awareness under anesthesia, Back problem, BiPAP (biphasic positive airway pressure) dependence, Blood transfusion, without reported diagnosis, Breast CA (CMS HCC), Bruises easily, Cataract, Cervical cancer (CMS HCC), Chronic bronchitis (CMS HCC), Chronic obstructive airway disease (CMS HCC), Clotting disorder (CMS HCC), Cognitive dysfunction, Colon cancer (CMS HCC), Congenital anomaly of heart, Congestive heart failure, unspecified, COPD (chronic obstructive pulmonary disease) (CMS HCC), CPAP (continuous positive airway pressure) dependence, CVA (cerebrovascular accident) (CMS [place]), Deep vein thrombosis (DVT) (CMS [place]), Diabetes, Diabetes mellitus (CMS [place]), Diabetes mellitus type II, Diabetes mellitus, type 1, Diabetes mellitus, type 2 (CMS [place]), Difficult intubation, Difficult intubation, Difficulty waking, Disorder of  liver, Dysrhythmias, Endometrial cancer (CMS HCC), Glaucoma, H/O hearing loss, H/O urinary tract infection, Hard to intubate, Hearing loss, Hepatic cirrhosis (CMS HCC), Hepatitis A virus infection, HTN, breast cancer, coronary artery bypass graft, transfusion, Hyperthyroidism, Hypothyroid, Kidney disease, Loss of weight, Malignant hyperthermia, MI (myocardial infarction) (CMS HCC), Myocardial infarction (CMS HCC), Nausea with vomiting, Neck problem, Other convulsions, Ovarian cancer (CMS [place]), Pacemaker, Palpitations, Panic attack, Personal history of irradiation, Personal history of malignant neoplasm of prostate, PONV (postoperative nausea and vomiting), PONV (postoperative nausea and vomiting), Problems  with swallowing, Pseudocholinesterase deficiency, Pulmonary embolism (CMS HCC), Pulmonary emphysema (CMS HCC), Renal failure, Seizure (CMS HCC), Seizures, Shortness of breath, Sleep apnea, Spinal headache, STD (sexually transmitted disease), Stented coronary artery, Stroke (CMS [facility]), Thyroid disorder, Treatment, Type 2 diabetes mellitus, Type 2 diabetes mellitus (CMS [place]), Type I diabetes mellitus (CMS [place]), Upper respiratory infection, Uterine cancer (CMS [place]), Valvular disease, Viral hepatitis B, or Viral hepatitis C.    Past Surgical History:   Procedure Laterality Date   . COLONOSCOPY  01/06/2009    COLONOSCOPY performed by Loletta Parish F at [facility]   . GASTROSCOPY  03/05/2010    GASTROSCOPY performed by Jocelyn Lamer at [facility]   . GASTROSCOPY WITH BIOPSY  03/05/2010    GASTROSCOPY WITH BIOPSY performed by Jocelyn Lamer at [facility]   . HX ADENOIDECTOMY     . HX ANKLE FRACTURE TX  2007    left distal fibula, casted   . HX APPENDECTOMY     . HX CHOLECYSTECTOMY     . HX COLONOSCOPY     . HX CYSTOCELE REPAIR  09/24/2009   . HX HAND SURGERY  2012    for Carpal tunnel : right side treated   . HX HEART CATHETERIZATION     . HX OOPHORECTOMY      left ovary removed   . HX PAROTIDECTOMY       "surgical clamp"     . HX PARTIAL THYROIDECTOMY Right 04/05/2019    R hemithyroidectomy for nodule, final path follicular adenoma   . HX TONSILLECTOMY     . HX TOTAL VAGINAL HYSTERECTOMY  1979   . HX WRIST FRACTURE [place]  2007    [place] injury   . PARS PLANA VITRECTOMY Right 11/02/2021    Pars plana vitrectomy, nternal limiting membrane (ILM) and epiretinal membrane (ERM) peel, Injection of 20%  sf6 gas, Subtenon's injection of Cefuroxime 50 mg and Dexamethasone 2 mg   . PB REVISE ULNAR NERVE AT ELBOW Left 1979   . PB UPPER GI ENDOSCOPY,BIOPSY  12/19/2007    patulous GE junction zone, erythema, nonerosive GERD           Family Medical History:     Problem Relation (Age of Onset)    Bipolar Disorder Daughter, Paternal Uncle    Breast Cancer Paternal Aunt, Paternal Aunt, Paternal Aunt, Paternal Aunt, Paternal Aunt, Other    Cancer Paternal Aunt, Paternal 2, Paternal 14, Paternal 21, Paternal 51, Other (3)    Congestive Heart Failure Father (32)    Coronary Artery Disease Father, Mother (33)    Diabetes Mother    Heart Attack Father, Sister    High Cholesterol Mother    Hypertension (High Blood Pressure) Mother, Brother, Sister, Sister    Kidney Disease Sister    Leukemia Paternal Uncle    Stroke Paternal Grandmother    Thyroid Disease Sister            Social History     Tobacco Use   . Smoking status: Former     Packs/day: 2.00     Years: 40.00     Pack years: 80.00     Types: Cigarettes     Quit date: 01/22/2005     Years since quitting: 16.9   . Smokeless tobacco: Never   Vaping Use   . Vaping Use: Never used   Substance Use Topics   . Alcohol use: No     Alcohol/week: 0.0 standard drinks   .  Drug use: No       I have seen and examined the above patient. I discussed the above diagnoses listed in the assessment and the above ophthalmic plan of care with the patient and patient's family. All questions were answered. I reviewed and, when necessary, made changes to the technician/resident note, documented ophthalmology exam, chief  complaint, history of present illness, allergies, review of systems, past medical, past surgical, family and social history.    [name], MD 12/22/2021  15:35

## 2021-12-22 NOTE — Procedures (Signed)
[facility]  [place] [address]  Operated by [facility]  Procedure Note    Name: [name] MRN:  [id number]   Date: 12/22/2021 Age: 66 y.o.       [phone] - EXCISION OF CHALAZION; SINGLE (AMB ONLY)  Performed by: Alden Benjamin, MD  Authorized by: [name], MD     Time Out:     Immediately before the procedure, a time out was called:  Yes    Patient verified:  Yes    Procedure Verified:  Yes    Site Verified:  Yes    Date of service:  12/22/2021    Name:  [name]  Chart#:  [id number]  DOB:  1956-08-24    MINOR ROOM OPERATIVE REPORT    Informed Consent Obtained?  yes    Surgical Pause:  yes  Time:  11:22     Prep:  Alcohol    Local Anesthesia:  2 ml of 2%      Procedure Note    Pre-op Dx:  L caruncle/conjunctival lesion  LLL lesion  Post-op Dx:  Same    Procedure:  Excision of lesion left lowereyelid, 8x6mm  Excision of L conjunctival lesion - 5x19mm    Surgeon:  [name], MD    Assistant:  Alden Benjamin, MD    Suture:  None    Complications:   None    Post-Op Ointment/Drop:  Erythromycin oitment     Alden Benjamin, MD 12/22/2021, 11:22    DESCRIPTION OF PROCEDURE:  After obtaining informed consent, the patient was brought to the operating suite and laid supine on the operating table.  2% lidocaine with 1:200,000 dilution of epinephrine were infiltrated into the area of chalazion on leftlower eyelid.  The face was prepped and draped in a sterile ophthalmic fashion.     LLL lesion excision:  A scleral shell was placed.  A chalazion clamp was placed to isolate the lesion.  The lid was everted.  A #11 blade was used to incise over the center of the lesion.  Curettes were used to remove the content of the lesion but it was empty.  0.5 forcep and [place] scissor were used to excise the lesion remnant and sent to pathology.   Hemostasis was maintained with bipolar cautery.  The clamp was removed.    L conjunctival lesion excision:  The medial conj lesion was grasped  and excised. Hemostasis was maintained with bipolar cautery.       Antibiotic ointment was applied over the wounds.  The patient tolerated the procedure well and was taken to the recovery room in stable condition.  The patient was instructed to use antibiotic ointment on the wounds q.i.d. for the next 7 days.  The patient will return to Dr. Lamont Snowball clinic in 1 week or sooner p.r.n.    Post-Procedure Note    Bleeding:    without any bleeding    Patient is stable, and ready for discharge from the clinic.    Follow-up in 2 weeks, sooner PRN     Alden Benjamin, MD 12/22/2021, 11:22              Alden Benjamin, MD  I was present for all key and/or critical portions of the case and immediately available at all times.      [name], MD 12/24/2021, 41:66

## 2021-12-29 ENCOUNTER — Other Ambulatory Visit: Payer: Self-pay

## 2021-12-29 ENCOUNTER — Inpatient Hospital Stay (HOSPITAL_COMMUNITY)
Admission: RE | Admit: 2021-12-29 | Discharge: 2021-12-29 | Disposition: A | Discharge status: Home / Self Care | Payer: MEDICAID | Source: Ambulatory Visit

## 2021-12-29 ENCOUNTER — Ambulatory Visit: Payer: MEDICAID | Attending: Ophthalmology | Admitting: Ophthalmology

## 2021-12-29 ENCOUNTER — Encounter (INDEPENDENT_AMBULATORY_CARE_PROVIDER_SITE_OTHER): Payer: Self-pay | Admitting: Ophthalmology

## 2021-12-29 DIAGNOSIS — Z9889 Other specified postprocedural states: Secondary | ICD-10-CM | POA: Insufficient documentation

## 2021-12-29 NOTE — Progress Notes (Addendum)
[facility]  [place] [id number]  Operated by [facility] Note    12/29/2021    Patient Name: [name]    Date of Birth:  [birth year]    Referring Provider:  Alvis Lemmings, PA-C  [place]  [place],  [place] [address]    Ophthalmologist/Optometrist:      Chief Complaint    Post-op Eye         HPI     Post-op Eye    In left eye.  Pain was noted as 0/10.           Comments    Pt is here for post op check after procedure to remove Chalazion from LLL and Conj lesion near caruncle OS on 12/22/21.  Pt notes that she is doing well  No eye pain   No bruising   Finished the ointment            Last edited by Zettie Cooley, COA on 12/29/2021  9:40 AM.          ROS    Positive for: Eyes (pov)  Negative for: Constitutional, Gastrointestinal, Neurological, Skin, Genitourinary, Musculoskeletal, HENT, Endocrine, Cardiovascular, Respiratory, Psychiatric, Allergic/Imm, Heme/Lymph  Last edited by Zettie Cooley, COA on 12/29/2021  9:40 AM.          History of Sun Exposure:no If yes:   Excessive sun burn:   History radiation to head:   History of skin cancer:        Past Ocular history:   Past Surgical History:   Procedure Laterality Date   . CHALAZION EXCISION Left 12/22/2021    and conj lesion excision from caruncle   . COLONOSCOPY N/A 07/23/2019    Performed by Virgel Manifold, MD at [facility]   . COLONOSCOPY N/A 01/06/2009    Performed by Tama High, MD at [facility]   . COLONOSCOPY WITH BIOPSY N/A 07/23/2019    Performed by Virgel Manifold, MD at [facility]   . COLONOSCOPY WITH POLYPECTOMY N/A 07/23/2019    Performed by Virgel Manifold, MD at [facility]   . CYSTOSCOPY N/A 09/24/2009    Performed by Domingo Sep, MD at [facility]   . CYSTOSCOPY WITH CYSTOMETROGRAM AND URODYNAMICS N/A 05/01/2018    Performed by Thurmon Fair, MD at [facility]   . EYELID RETRACTION REPAIR WITH EAR CARTILAGE GRAFT Right  08/10/2016    Performed by [name], MD at [facility]   . EYELID RETRACTION REPAIR WITH EAR CARTILAGE GRAFT Bilateral 01/28/2015    Performed by [name], MD at [facility]   . GASTROSCOPY Left 03/05/2010    Performed by Jocelyn Lamer, MD at [facility]   . GASTROSCOPY Left 12/19/2007    Performed by Laurine Blazer, MD at [facility]   . GASTROSCOPY WITH BIOPSY Left 03/05/2010    Performed by Jocelyn Lamer, MD at [facility]   . HEMITHYROIDECTOMY Right 04/05/2019    Performed by Delfino Lovett, MD at [facility]   . HX ADENOIDECTOMY     . HX ANKLE FRACTURE [place]  2007    left distal fibula, casted   . HX APPENDECTOMY     . HX CHOLECYSTECTOMY     . HX COLONOSCOPY     .  HX CYSTOCELE REPAIR  09/24/2009   . HX HAND SURGERY  2012    for Carpal tunnel : right side treated   . HX HEART CATHETERIZATION     . HX OOPHORECTOMY      left ovary removed   . HX PAROTIDECTOMY       "surgical clamp"    . HX PARTIAL THYROIDECTOMY Right 04/05/2019    R hemithyroidectomy for nodule, final path follicular adenoma   . HX TONSILLECTOMY     . HX TOTAL VAGINAL HYSTERECTOMY  1979   . HX WRIST FRACTURE [place]  2007    [place] injury   . INJECTION EXCHANGE GAS Right 11/02/2021    Performed by Con Memos, MD at [facility] N/A 01/03/2008    Performed by Laurine Blazer, MD at [facility]   . PAROTIDECTOMY SUPERFICIAL WITH FACIAL NERVE DISSECTION Right 02/06/2015    Performed by Delfino Lovett, MD at [facility]   . PARS PLANA VITRECTOMY Right 11/02/2021    Pars plana vitrectomy, nternal limiting membrane (ILM) and epiretinal membrane (ERM) peel, Injection of 20%  sf6 gas, Subtenon's injection of Cefuroxime 50 mg and Dexamethasone 2 mg   . PB REVISE ULNAR NERVE AT ELBOW Left 1979   . PB UPPER GI ENDOSCOPY,BIOPSY  12/19/2007    patulous GE junction zone, erythema, nonerosive GERD   . [place] PROBE N/A 01/03/2008    Performed by Laurine Blazer, MD at [place]   . RELEASE CARPAL  TUNNEL Right 11/30/2010    Performed by Stoney Bang, MD at [place]   . REPAIR ANTERIOR AND POSTERIOR N/A 09/24/2009    Performed by Domingo Sep, MD at [facility]   . REPAIR ECTROPION BILATERAL Bilateral 01/28/2015    Performed by [name], MD at [facility]   . SUSPENSION VAGINAL VAULT N/A 09/24/2009    Performed by Domingo Sep, MD at [facility]   . VAGINECTOMY PARTIAL N/A 09/24/2009    Performed by Domingo Sep, MD at [facility]   . VITRECTOMY POSTERIOR WITH MEMBRANE PEEL Right 11/02/2021    Performed by Con Memos, MD at [place], [place] 12/29/2021, 17:16    MD Addition to HPI:    The patient is a 66 y.o. female here with complaint of:    RPV, s/p LLL and conj lesion removal (12/22/21). Pt has been doing well since the last visit. Endorses drainage qPM and crusting qAM. Pt notes soreness near procedure site. Denies new FOL/floaters, diplopia, HA, and pain.     Assessment     Impression(s):      ICD-10-CM    1. Postsurgical state, eye  Z98.890 OPH EXTERNAL PHOTOS          Plan(s)/Recommendation(s):    s/p LLL and conj lesion removal (12/22/21)  - healing well  - pathology hasn't finalized yet  - cont emycin qPM  - cont AT OU PRN  - rec WC  - no restrictions - rec to not rub area  - informed pt stitches will dissolve  - RTC 2-3 wks    [place] OU  - not visually significant  - cont to monitor and follow with Dr. Lucia Gaskins    Hx of macular hole ODs/p repair  -continue to follow with Dr. Darnell Level  Imaging (CT/MRI) Reviewed:   Results Reviewed:       I personally performed the services described in this documentation, as scribed  in my presence, and it is both accurate  and complete.  [name], MD 12/29/2021  17:16    I am scribing for, and in the presence of Dr. Alfonse Spruce for services provided on 12/29/2021.  [name], [place], [place]  12/29/2021, 09:56    I saw and examined the patient.  I reviewed the technician/resident's note.  I agree  with the findings and plan of care as documented in the resident's note.  Any exceptions/additions are edited/noted.    [name], MD 12/29/2021  17:16      Eye Examination:     Neuro/Psych     Oriented x3: Yes    Mood/Affect: Normal        Visual Acuity     Visual Acuity (Snellen - Linear)       Right Left    Dist cc 20/80 -2 20/20    Dist ph cc 20/60 -2     Correction: Glasses          Edited by: Zettie Cooley, COA            Not recorded       Not recorded       Not recorded       Confrontational Visual Fields     Visual Fields       Right Left     Full Full          Edited by: Magdalene Patricia, MD            Pupils       Pupils APD    Right PERRL None    Left PERRL None        Extraocular Movement     Extraocular Movement       Right Left     Full Full          Edited by: Zettie Cooley, COA                               Sensation   V1: Intact bilaterally   V2: Intact bilaterally   V3: Intact bilaterally    Facial Contours:     Hertel:                            OD:                            OS:                            Base:    Eyelids:    Bell's:      Lid edema OD: OS:  Conj chem OD:  OS:   Lid inject OD:  OS:  Conj inject OD:  OS:              PF   MRD                       LF   LC  USS   LSS                    LAG            Main Ophthalmology Exam     External Exam       Right Left    External Normal Periorbital edema          Slit Lamp Exam       Right Left    Lids/Lashes Normal Ecchymosis    Conjunctiva/Sclera White and quiet White and quiet    Cornea Clear Clear    Anterior Chamber Deep and quiet Deep and quiet    Iris Round and reactive Round and reactive    Lens trace NS trace NS    Anterior Vitreous Normal Normal                IOP straight Not recorded           Upgaze     5 down      Past Medical / Surgical / Family / Social History:      has a past medical history of Anxiety, Arthritis, Cancer (CMS [place]), Chronic pain, Depression, Dyspnea on  exertion, Essential hypertension (02/16/2017), GERD (gastroesophageal reflux disease), Headache, Heart murmur (1979), Hyperlipidemia LDL goal < 100 (08/29/2013), MINOR CAD (coronary artery disease) (11/10/2012), Multinodular goiter, Muscle weakness, Obesity, Peripheral edema, Rash, Rheumatic fever, S/P thyroidectomy, Squamous cell carcinoma (37/34/2876), 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    She has no past medical history of Allergic rhinitis, Anemia, Anesthesia complication, Angina at rest (CMS [facility]), Angina pectoris (CMS [facility]), Aortic aneurysm (CMS [facility]), Asthma, Asthma, Automatic implantable cardioverter-defibrillator in situ, Awareness under anesthesia, Back problem, BiPAP (biphasic positive airway pressure) dependence, Blood transfusion, without reported diagnosis, Breast CA (CMS [facility]), Bruises easily, Cataract, Cervical cancer (CMS [facility]), Chronic bronchitis (CMS [facility]), Chronic obstructive airway disease (CMS [facility]), Clotting disorder (CMS [facility]), Cognitive dysfunction, Colon cancer (CMS [facility]), Congenital anomaly of heart, Congestive heart failure, unspecified, COPD (chronic obstructive pulmonary disease) (CMS [facility]), CPAP (continuous positive airway pressure) dependence, CVA (cerebrovascular accident) (CMS [facility]), Deep vein thrombosis (DVT) (CMS [place]), Diabetes, Diabetes mellitus (CMS [place]), Diabetes mellitus type II, Diabetes mellitus, type 1, Diabetes mellitus, type 2 (CMS HCC), Difficult intubation, Difficult intubation, Difficulty waking, Disorder of liver, Dysrhythmias, Endometrial cancer (CMS [facility]), Glaucoma, H/O hearing loss, H/O urinary tract infection, Hard to intubate, Hearing loss, Hepatic cirrhosis (CMS [facility]), Hepatitis A virus infection, HTN, breast cancer, coronary artery bypass graft, transfusion, Hyperthyroidism, Hypothyroid, Kidney disease, Loss of weight, Malignant hyperthermia, MI (myocardial infarction) (CMS [facility]), Myocardial infarction (CMS [facility]),  Nausea with vomiting, Neck problem, Other convulsions, Ovarian cancer (CMS [place]), Pacemaker, Palpitations, Panic attack, Personal history of irradiation, Personal history of malignant neoplasm of prostate, PONV (postoperative nausea and vomiting), PONV (postoperative nausea and vomiting), Problems with swallowing, Pseudocholinesterase deficiency, Pulmonary embolism (CMS HCC), Pulmonary emphysema (CMS HCC), Renal failure, Seizure (CMS HCC), Seizures, Shortness of breath, Sleep apnea, Spinal headache, STD (sexually transmitted disease), Stented coronary artery, Stroke (CMS HCC), Thyroid disorder, Treatment, Type 2 diabetes mellitus, Type 2 diabetes mellitus (CMS [place]), Type I diabetes mellitus (CMS [place]), Upper respiratory infection, Uterine cancer (CMS [place]), Valvular disease, Viral hepatitis B, or Viral hepatitis C.    Past Surgical History:   Procedure Laterality Date   . CHALAZION EXCISION Left 12/22/2021    and conj lesion excision from caruncle   . COLONOSCOPY  01/06/2009    COLONOSCOPY  performed by Loletta Parish F at [facility]   . GASTROSCOPY  03/05/2010    GASTROSCOPY performed by Jocelyn Lamer at [facility]   . GASTROSCOPY WITH BIOPSY  03/05/2010    GASTROSCOPY WITH BIOPSY performed by Jocelyn Lamer at [facility]   . HX ADENOIDECTOMY     . HX ANKLE FRACTURE TX  2007    left distal fibula, casted   . HX APPENDECTOMY     . HX CHOLECYSTECTOMY     . HX COLONOSCOPY     . HX CYSTOCELE REPAIR  09/24/2009   . HX HAND SURGERY  2012    for Carpal tunnel : right side treated   . HX HEART CATHETERIZATION     . HX OOPHORECTOMY      left ovary removed   . HX PAROTIDECTOMY       "surgical clamp"    . HX PARTIAL THYROIDECTOMY Right 04/05/2019    R hemithyroidectomy for nodule, final path follicular adenoma   . HX TONSILLECTOMY     . HX TOTAL VAGINAL HYSTERECTOMY  1979   . HX WRIST FRACTURE [place]  2007    [place] injury   . PARS PLANA VITRECTOMY Right 11/02/2021    Pars plana vitrectomy, nternal limiting membrane (ILM) and epiretinal  membrane (ERM) peel, Injection of 20%  sf6 gas, Subtenon's injection of Cefuroxime 50 mg and Dexamethasone 2 mg   . PB REVISE ULNAR NERVE AT ELBOW Left 1979   . PB UPPER GI ENDOSCOPY,BIOPSY  12/19/2007    patulous GE junction zone, erythema, nonerosive GERD           Family Medical History:     Problem Relation (Age of Onset)    Bipolar Disorder Daughter, Paternal Uncle    Breast Cancer Paternal Aunt, Paternal Aunt, Paternal Aunt, Paternal Aunt, Paternal Aunt, Other    Cancer Paternal Aunt, Paternal 9, Paternal 50, Paternal 36, Paternal 43, Other (59)    Congestive Heart Failure Father (33)    Coronary Artery Disease Father, Mother (38)    Diabetes Mother    Heart Attack Father, Sister    High Cholesterol Mother    Hypertension (High Blood Pressure) Mother, Brother, Sister, Sister    Kidney Disease Sister    Leukemia Paternal Uncle    Stroke Paternal Grandmother    Thyroid Disease Sister            Social History     Tobacco Use   . Smoking status: Former     Packs/day: 2.00     Years: 40.00     Pack years: 80.00     Types: Cigarettes     Quit date: 01/22/2005     Years since quitting: 16.9   . Smokeless tobacco: Never   Vaping Use   . Vaping Use: Never used   Substance Use Topics   . Alcohol use: No     Alcohol/week: 0.0 standard drinks   . Drug use: No       I have seen and examined the above patient. I discussed the above diagnoses listed in the assessment and the above ophthalmic plan of care with the patient and patient's family. All questions were answered. I reviewed and, when necessary, made changes to the technician/resident note, documented ophthalmology exam, chief complaint, history of present illness, allergies, review of systems, past medical, past surgical, family and social history.    [name], MD 12/29/2021  17:16

## 2021-12-31 ENCOUNTER — Encounter (INDEPENDENT_AMBULATORY_CARE_PROVIDER_SITE_OTHER): Payer: Self-pay | Admitting: Ophthalmology

## 2021-12-31 DIAGNOSIS — H119 Unspecified disorder of conjunctiva: Secondary | ICD-10-CM

## 2021-12-31 DIAGNOSIS — H029 Unspecified disorder of eyelid: Secondary | ICD-10-CM

## 2021-12-31 LAB — SURGICAL PATHOLOGY SPECIMEN

## 2022-01-12 ENCOUNTER — Ambulatory Visit: Payer: MEDICAID | Attending: Ophthalmology | Admitting: Ophthalmology

## 2022-01-12 ENCOUNTER — Inpatient Hospital Stay (HOSPITAL_COMMUNITY)
Admission: RE | Admit: 2022-01-12 | Discharge: 2022-01-12 | Disposition: A | Discharge status: Home / Self Care | Payer: MEDICAID | Source: Ambulatory Visit

## 2022-01-12 ENCOUNTER — Encounter (INDEPENDENT_AMBULATORY_CARE_PROVIDER_SITE_OTHER): Payer: Self-pay | Admitting: Ophthalmology

## 2022-01-12 ENCOUNTER — Other Ambulatory Visit: Payer: Self-pay

## 2022-01-12 DIAGNOSIS — Z9889 Other specified postprocedural states: Secondary | ICD-10-CM

## 2022-01-12 DIAGNOSIS — H029 Unspecified disorder of eyelid: Secondary | ICD-10-CM | POA: Insufficient documentation

## 2022-01-12 NOTE — Progress Notes (Addendum)
[facility]  [place] [address]  Operated by [facility]    01/12/2022    Patient Name: [name]    Date of Birth:  [birth year]    Referring Provider:  Alvis Lemmings, PA-C  [place]  [place],  [place] [address]    Ophthalmologist/Optometrist:      Chief Complaint    Post Op         HPI    S/p LLL  patient states her vision is ok. The bump on her LLL is still there. No pain. Doesn't feel any stitches.   Last edited by Delfin Gant, COMT on 01/12/2022  7:27 AM.          ROS    Positive for: Constitutional (tired), Eyes (POV)  Negative for: Gastrointestinal, Neurological, Skin, Genitourinary, Musculoskeletal, HENT, Endocrine, Cardiovascular, Respiratory, Psychiatric, Allergic/Imm, Heme/Lymph  Last edited by Delfin Gant, COMT on 01/12/2022  7:27 AM.          History of Sun Exposure: If yes:   Excessive sun burn:   History radiation to head:   History of skin cancer:        Past Ocular history:   Past Surgical History:   Procedure Laterality Date    CHALAZION EXCISION Left 12/22/2021    and conj lesion excision from caruncle    COLONOSCOPY N/A 07/23/2019    Performed by Virgel Manifold, MD at [place] 01/06/2009    Performed by Tama High, MD at [place] N/A 07/23/2019    Performed by Virgel Manifold, MD at [place] N/A 07/23/2019    Performed by Virgel Manifold, MD at [place] N/A 09/24/2009    Performed by Domingo Sep, MD at [place] N/A 05/01/2018    Performed by Thurmon Fair, MD at [place] Right 08/10/2016    Performed by [name], MD at [place] Bilateral 01/28/2015    Performed by  [name], MD at [place] Left 03/05/2010    Performed by Jocelyn Lamer, MD at [place] Left 12/19/2007    Performed by Laurine Blazer, MD at [place] Left 03/05/2010    Performed by Jocelyn Lamer, MD at [place]    HEMITHYROIDECTOMY Right 04/05/2019    Performed by Delfino Lovett, MD at [place]    HX ADENOIDECTOMY      [place]  2007    left distal fibula, casted    HX APPENDECTOMY      HX CHOLECYSTECTOMY      HX COLONOSCOPY      HX CYSTOCELE REPAIR  09/24/2009    HX HAND SURGERY  2012    for Carpal tunnel : right side treated    HX HEART CATHETERIZATION      HX OOPHORECTOMY      left ovary removed    HX PAROTIDECTOMY       "surgical clamp"  HX PARTIAL THYROIDECTOMY Right 04/05/2019    R hemithyroidectomy for nodule, final path follicular adenoma    HX TONSILLECTOMY      HX TOTAL VAGINAL HYSTERECTOMY  1979    HX WRIST FRACTURE TX  2007    [place] injury    INJECTION EXCHANGE GAS Right 11/02/2021    Performed by Con Memos, MD at [place] 01/03/2008    Performed by Laurine Blazer, MD at [place] Right 02/06/2015    Performed by Delfino Lovett, MD at [place] Right 11/02/2021    Pars plana vitrectomy, nternal limiting membrane (ILM) and epiretinal membrane (ERM) peel, Injection of 20%  sf6 gas, Subtenon's injection of Cefuroxime 50 mg and Dexamethasone 2 mg    PB REVISE ULNAR NERVE AT ELBOW Left 1979    PB UPPER GI ENDOSCOPY,BIOPSY  12/19/2007    patulous GE junction zone, erythema, nonerosive GERD    PH PROBE N/A 01/03/2008    Performed by Laurine Blazer, MD at [place] Right 11/30/2010    Performed by Stoney Bang, MD at [place] N/A 09/24/2009    Performed by Domingo Sep, MD at [place] Bilateral 01/28/2015    Performed by [name], MD at [place] 09/24/2009    Performed by Domingo Sep, MD at [place]    VAGINECTOMY PARTIAL N/A 09/24/2009    Performed by Domingo Sep, MD at [place] Right 11/02/2021    Performed by Con Memos, MD at [place], [place] 01/12/2022, 17:14    MD Addition to HPI:    The patient is a 66 y.o. female here with complaint of:    RPV, 2 wk f/u s/p LLL and conj lesion removal (12/22/21). Pt has been doing well since the last visit. Pt reports that the LLL bump is still present. Denies new FOL/floaters, diplopia, HA, and pain.     Assessment     Impression(s):      ICD-10-CM    1. Postsurgical state, eye  Z98.890 OPH EXTERNAL PHOTOS      2. Lesion of left lower eyelid  H02.9           Plan(s)/Recommendation(s):      s/p LLL and conj lesion removal (12/22/21)  - healing well but persistent lesion seen  - pathology shows:   Left Conjunctiva, Biopsy:      - Skin and conjunctival tissue with chronic inflammation.      - A GMS stain is negative for fungal organisms (all controls stain appropriately).      Left Lower Eyelid, Biopsy:      - Benign skeletal muscle and cartilage.  - cont emycin qPM  - cont AT OU PRN  - no restrictions - rec to not rub area  - disc r/b/a of re-excision of LLL lesion, transcutaneous approach - pt understands and consents    [place] OU  - not visually significant  - cont to monitor and follow with Dr. Lucia Gaskins    Hx  of macular hole ODs/p repair  -continue to follow with Dr. Darnell Level    - Reviewed risks, benefits and alternatives of lesion excision. Potential risks include pain, bleeding, infection, scarring of eyelids, turning in/out of lids, damage to eye, loss of vision, need for further procedures. Pt understands and elects/desires for surgery     SURGERY    Procedure: LLL  excision of lesion transcutaneous approach    Diagnosis:     ICD-10-CM    1. Postsurgical state, eye  Z98.890 OPH EXTERNAL PHOTOS      2. Lesion of left lower eyelid  H02.9           Anesthesia: GENERAL    Length of time: 15-30 minutes    Special Needs:    Risks, benefits, and alternatives explained.    Pt understands and elects to proceed with surgery/desires for surgery  Imaging (CT/MRI) Reviewed:   Results Reviewed:       I personally performed the services described in this documentation, as scribed  in my presence, and it is both accurate  and complete.  [name], MD 01/12/2022  17:14    I am scribing for, and in the presence of, Dr. Alfonse Spruce for services provided on 01/12/2022.  [name], SCRIBE   [name], SCRIBE  01/12/2022, 07:45    I saw and examined the patient.  I reviewed the technician/resident's note.  I agree with the findings and plan of care as documented in the resident's note.  Any exceptions/additions are edited/noted.    [name], MD 01/12/2022  17:14      Eye Examination:     Neuro/Psych     Oriented x3: Yes    Mood/Affect: Normal        Visual Acuity     Visual Acuity (Snellen - Linear)       Right Left    Dist cc 20/200 20/25    Correction: Glasses          Edited by: Wotring, Erin E, COMT            Wearing Rx     Wearing Rx       Sphere Cylinder Axis Add    Right +2.25 +1.75 173 +2.50    Left +1.50 +0.50 013 +2.50          Edited by: Wotring, Dani Gobble, COMT            Not recorded       Not recorded       Confrontational Visual Fields     Visual Fields       Right Left                            Edited by: Wotring, Erin E, COMT            Pupils       APD    Right None    Left None        Extraocular Movement     Extraocular Movement       Right Left     Full, Ortho Full, Ortho          Edited by: Wotring, Erin E, COMT                               Sensation   V1: Intact bilaterally   V2: Intact bilaterally  V3: Intact bilaterally    Facial Contours:     Hertel:                             OD:                            OS:                            Base:    Eyelids:    Bell's:      Lid edema OD: OS:  Conj chem OD:  OS:   Lid inject OD:  OS:  Conj inject OD:  OS:              PF   MRD                       LF   LC                       USS   LSS                    LAG            Main Ophthalmology Exam     External Exam       Right Left    External Normal Normal          Slit Lamp Exam       Right Left    Lids/Lashes Normal c/d/i wound - healing but still persistent bump    Conjunctiva/Sclera White and quiet White and quiet    Cornea Clear Clear    Anterior Chamber Deep and quiet Deep and quiet    Iris Round and reactive Round and reactive    Lens trace NS trace NS    Anterior Vitreous Normal Normal                IOP straight Not recorded           Upgaze     5 down      Past Medical / Surgical / Family / Social History:      has a past medical history of Anxiety, Arthritis, Cancer (CMS [place]), Chronic pain, Depression, Dyspnea on exertion, Essential hypertension (02/16/2017), GERD (gastroesophageal reflux disease), Headache, Heart murmur (1979), Hyperlipidemia LDL goal < 100 (08/29/2013), MINOR CAD (coronary artery disease) (11/10/2012), Multinodular goiter, Muscle weakness, Obesity, Peripheral edema, Rash, Rheumatic fever, S/P thyroidectomy, Squamous cell carcinoma (65/99/3570), 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    She has no past medical history of Allergic rhinitis, Anemia, Anesthesia complication, Angina at rest (CMS [facility]), Angina pectoris (CMS [facility]), Aortic aneurysm (CMS [place]), Asthma, Asthma, Automatic implantable cardioverter-defibrillator in situ, Awareness under anesthesia, Back problem, BiPAP (biphasic positive airway pressure) dependence, Blood transfusion, without reported diagnosis, Breast CA (CMS [facility]), Bruises easily, Cataract, Cervical cancer (CMS HCC), Chronic bronchitis (CMS HCC), Chronic obstructive airway disease (CMS  HCC), Clotting disorder (CMS HCC), Cognitive dysfunction, Colon cancer (CMS [place]), Congenital anomaly of heart, Congestive heart failure, unspecified, COPD (chronic obstructive pulmonary disease) (CMS HCC), CPAP (continuous positive airway pressure) dependence, CVA (cerebrovascular accident) (CMS [place]), Deep vein thrombosis (DVT) (CMS [place]), Diabetes, Diabetes mellitus (CMS [place]), Diabetes mellitus type II, Diabetes mellitus, type 1, Diabetes mellitus, type 2 (CMS [place]), Difficult  intubation, Difficult intubation, Difficulty waking, Disorder of liver, Dysrhythmias, Endometrial cancer (CMS HCC), Glaucoma, H/O hearing loss, H/O urinary tract infection, Hard to intubate, Hearing loss, Hepatic cirrhosis (CMS HCC), Hepatitis A virus infection, HTN, breast cancer, coronary artery bypass graft, transfusion, Hyperthyroidism, Hypothyroid, Kidney disease, Loss of weight, Malignant hyperthermia, MI (myocardial infarction) (CMS HCC), Myocardial infarction (CMS HCC), Nausea with vomiting, Neck problem, Other convulsions, Ovarian cancer (CMS [place]), Pacemaker, Palpitations, Panic attack, Personal history of irradiation, Personal history of malignant neoplasm of prostate, PONV (postoperative nausea and vomiting), PONV (postoperative nausea and vomiting), Problems with swallowing, Pseudocholinesterase deficiency, Pulmonary embolism (CMS HCC), Pulmonary emphysema (CMS HCC), Renal failure, Seizure (CMS HCC), Seizures, Shortness of breath, Sleep apnea, Spinal headache, STD (sexually transmitted disease), Stented coronary artery, Stroke (CMS HCC), Thyroid disorder, Treatment, Type 2 diabetes mellitus, Type 2 diabetes mellitus (CMS [place]), Type I diabetes mellitus (CMS [place]), Upper respiratory infection, Uterine cancer (CMS [place]), Valvular disease, Viral hepatitis B, or Viral hepatitis C.    Past Surgical History:   Procedure Laterality Date    CHALAZION EXCISION Left 12/22/2021    and conj lesion excision from caruncle    COLONOSCOPY   01/06/2009    COLONOSCOPY performed by Murvin Donning, AHMED F at [place]  03/05/2010    GASTROSCOPY performed by Jocelyn Lamer at [place]  03/05/2010    GASTROSCOPY WITH BIOPSY performed by Jocelyn Lamer at [place]  2007    left distal fibula, casted    HX APPENDECTOMY      HX CHOLECYSTECTOMY      HX COLONOSCOPY      HX CYSTOCELE REPAIR  09/24/2009    HX HAND SURGERY  2012    for Carpal tunnel : right side treated    HX HEART CATHETERIZATION      HX OOPHORECTOMY      left ovary removed    HX PAROTIDECTOMY       "surgical clamp"     HX PARTIAL THYROIDECTOMY Right 04/05/2019    R hemithyroidectomy for nodule, final path follicular adenoma    HX TONSILLECTOMY      HX TOTAL VAGINAL HYSTERECTOMY  1979    HX WRIST FRACTURE TX  2007    [place] injury    PARS PLANA VITRECTOMY Right 11/02/2021    Pars plana vitrectomy, nternal limiting membrane (ILM) and epiretinal membrane (ERM) peel, Injection of 20%  sf6 gas, Subtenon's injection of Cefuroxime 50 mg and Dexamethasone 2 mg    PB REVISE ULNAR NERVE AT ELBOW Left 1979    PB UPPER GI ENDOSCOPY,BIOPSY  12/19/2007    patulous GE junction zone, erythema, nonerosive GERD           Family Medical History:     Problem Relation (Age of Onset)    Bipolar Disorder Daughter, Paternal Uncle    Breast Cancer Paternal Aunt, Paternal Aunt, Paternal 59, Paternal 52, Paternal 36, Other    Cancer Paternal Aunt, Paternal 72, Paternal 60, Paternal 101, Paternal 54, Other (22)    Congestive Heart Failure Father (28)    Coronary Artery Disease Father, Mother (69)    Diabetes Mother    Heart Attack Father, Sister    High Cholesterol Mother    Hypertension (High Blood Pressure) Mother, Brother, Sister, Sister    Kidney Disease Sister    Leukemia Paternal Uncle  Stroke Paternal Grandmother    Thyroid Disease Sister            Social History     Tobacco Use    Smoking status:  Former     Packs/day: 2.00     Years: 40.00     Pack years: 80.00     Types: Cigarettes     Quit date: 01/22/2005     Years since quitting: 16.9    Smokeless tobacco: Never   Vaping Use    Vaping Use: Never used   Substance Use Topics    Alcohol use: No     Alcohol/week: 0.0 standard drinks    Drug use: No       I have seen and examined the above patient. I discussed the above diagnoses listed in the assessment and the above ophthalmic plan of care with the patient and patient's family. All questions were answered. I reviewed and, when necessary, made changes to the technician/resident note, documented ophthalmology exam, chief complaint, history of present illness, allergies, review of systems, past medical, past surgical, family and social history.    [name], MD 01/12/2022  17:14

## 2022-01-19 ENCOUNTER — Encounter (INDEPENDENT_AMBULATORY_CARE_PROVIDER_SITE_OTHER): Payer: Self-pay | Admitting: Anesthesiology

## 2022-01-19 ENCOUNTER — Encounter (INDEPENDENT_AMBULATORY_CARE_PROVIDER_SITE_OTHER): Payer: Self-pay | Admitting: Ophthalmology

## 2022-01-19 NOTE — Progress Notes (Signed)
Tried calling patient to schedule surgery  With Dr Alfonse Spruce.  Left voicemail for patient to return call.

## 2022-01-21 ENCOUNTER — Encounter (HOSPITAL_COMMUNITY): Payer: MEDICAID | Admitting: Student in an Organized Health Care Education/Training Program

## 2022-01-26 ENCOUNTER — Encounter (INDEPENDENT_AMBULATORY_CARE_PROVIDER_SITE_OTHER): Payer: Self-pay | Admitting: Ophthalmology

## 2022-01-26 NOTE — Progress Notes (Signed)
Tried calling patient to schedule surgery with  Dr Nguyen.  Left voicemail for patient to return call.

## 2022-01-27 ENCOUNTER — Encounter (INDEPENDENT_AMBULATORY_CARE_PROVIDER_SITE_OTHER): Payer: Self-pay | Admitting: Ophthalmology

## 2022-02-01 ENCOUNTER — Other Ambulatory Visit: Payer: Self-pay

## 2022-02-01 ENCOUNTER — Ambulatory Visit: Payer: MEDICAID | Attending: Physician Assistant | Admitting: Physician Assistant

## 2022-02-01 ENCOUNTER — Encounter (HOSPITAL_BASED_OUTPATIENT_CLINIC_OR_DEPARTMENT_OTHER): Payer: Self-pay | Admitting: Physician Assistant

## 2022-02-01 VITALS — BP 135/84 | HR 93 | Temp 97.7°F | Resp 18 | Ht 65.0 in | Wt 216.1 lb

## 2022-02-01 DIAGNOSIS — G518 Other disorders of facial nerve: Secondary | ICD-10-CM | POA: Insufficient documentation

## 2022-02-01 DIAGNOSIS — E890 Postprocedural hypothyroidism: Secondary | ICD-10-CM | POA: Insufficient documentation

## 2022-02-01 DIAGNOSIS — Z6835 Body mass index (BMI) 35.0-35.9, adult: Secondary | ICD-10-CM

## 2022-02-01 DIAGNOSIS — E785 Hyperlipidemia, unspecified: Secondary | ICD-10-CM | POA: Insufficient documentation

## 2022-02-01 DIAGNOSIS — Z01818 Encounter for other preprocedural examination: Secondary | ICD-10-CM | POA: Insufficient documentation

## 2022-02-01 DIAGNOSIS — I10 Essential (primary) hypertension: Secondary | ICD-10-CM | POA: Insufficient documentation

## 2022-02-01 MED ORDER — GABAPENTIN 600 MG TABLET
600.0000 mg | ORAL_TABLET | Freq: Two times a day (BID) | ORAL | 5 refills | Status: DC
Start: 2022-02-01 — End: 2022-08-13

## 2022-02-01 NOTE — Addendum Note (Signed)
Addended by: Alvis Lemmings on: 02/01/2022 02:50 PM     Modules accepted: Orders

## 2022-02-01 NOTE — Progress Notes (Signed)
[name]  MRN: [id number]  DOB: [birth year]  Date: 02/01/2022      CC: Preoperative Clearance    Requesting MD: Dr. Cyndie Chime     HPI:  66 y.o. female here as a consultation visit for preoperative clearance for LLL excision of lesion transcutaneous approach.     PMH:   Surgeries:  Past Surgical History:   Procedure Laterality Date   . CHALAZION EXCISION Left 12/22/2021    and conj lesion excision from caruncle   . COLONOSCOPY  01/06/2009    COLONOSCOPY performed by Augustin Schooling F at [facility] OR ENDO   . GASTROSCOPY  03/05/2010    GASTROSCOPY performed by Knox Saliva at [facility] OR ENDO   . GASTROSCOPY WITH BIOPSY  03/05/2010    GASTROSCOPY WITH BIOPSY performed by Knox Saliva at [facility] OR ENDO   . HX ADENOIDECTOMY     . HX ANKLE FRACTURE TX  2007    left distal fibula, casted   . HX APPENDECTOMY     . HX CHOLECYSTECTOMY     . HX COLONOSCOPY     . HX CYSTOCELE REPAIR  09/24/2009   . HX HAND SURGERY  2012    for Carpal tunnel : right side treated   . HX HEART CATHETERIZATION     . HX OOPHORECTOMY      left ovary removed   . HX PAROTIDECTOMY       "surgical clamp"    . HX PARTIAL THYROIDECTOMY Right 04/05/2019    R hemithyroidectomy for nodule, final path follicular adenoma   . HX TONSILLECTOMY     . HX TOTAL VAGINAL HYSTERECTOMY  1979   . HX WRIST FRACTURE TX  2007    FOOSH injury   . PARS PLANA VITRECTOMY Right 11/02/2021    Pars plana vitrectomy, nternal limiting membrane (ILM) and epiretinal membrane (ERM) peel, Injection of 20%  sf6 gas, Subtenon's injection of Cefuroxime 50 mg and Dexamethasone 2 mg   . PB REVISE ULNAR NERVE AT ELBOW Left 1979   . PB UPPER GI ENDOSCOPY,BIOPSY  12/19/2007    patulous GE junction zone, erythema, nonerosive GERD         Illnesses:   Patient Active Problem List   Diagnosis   . Migraine Headaches   . GERD not well controlled   . Patellofemoral pain syndrome   . Multinodular goiter   . Microscopic hematuria   . Neck pain   . Lichen sclerosus et atrophicus of the vulva   . Carpal tunnel syndrome  on right   . Osteoarthritis, knee   . Left knee pain   . Chest pain at rest   . MINOR CAD (coronary artery disease)   . Hyperlipidemia LDL goal < 100   . Otitis media of both ears   . Mass of parotid gland, right   . Squamous cell carcinoma   . Chest pain   . Dysuria   . Multiple pulmonary nodules determined by computed tomography of lung   . History of rheumatic fever   . Neuropathy (CMS HCC)   . PTSD (post-traumatic stress disorder)   . Acute suppurative otitis media without spontaneous rupture of ear drum   . 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 Lung nodule < 6cm on CT   . Encounter for HCV screening test for high risk patient   . S/P thyroidectomy   . GAD (generalized anxiety disorder)   . Severe episode of recurrent major depressive disorder, without  psychotic features (CMS HCC)   . Right hip pain   . Hand pain   . Cervical neck pain with evidence of disc disease   . Lumbar pain with radiation down right leg     Medications:   Current Outpatient Medications   Medication Sig   . Azelaic Acid 20 % Cream Apply topically Twice daily   . buPROPion (WELLBUTRIN XL) 300 mg extended release 24 hr tablet Take 1 Tablet (300 mg total) by mouth Once a day   . cetirizine (ZYRTEC) 10 mg Oral Tablet Take 1 Tablet (10 mg total) by mouth Once a day   . clobetasoL (TEMOVATE) 0.05 % Ointment Apply topically Twice daily   . cyclopentolate (CYCLOGYL) 1 % Ophthalmic Drops FOR USE IN THE SURGICAL SUITE AND 3 TIMES A DAY INTO RIGHT EYE AS DIRECTED (Patient not taking: Reported on 12/29/2021)   . diclofenac sodium (VOLTAREN) 1 % Gel Apply topically Four times a day - before meals and bedtime   . FLUoxetine 20 mg Oral Tablet Take 3 tablets daily (60 mg total)   . fluticasone propionate (FLONASE) 50 mcg/actuation Nasal Spray, Suspension Administer 2 Sprays into each nostril Once a day   . gabapentin (NEURONTIN) 600 mg Oral Tablet Take 1 Tablet (600 mg total) by mouth Twice daily   . Halobetasol Propionate (ULTRAVATE) 0.05 % Cream  Apply topically Twice per day as needed Apply  topically. Three times a week   . loratadine (CLARITIN) 10 mg Oral Tablet Take 1 Tablet (10 mg total) by mouth Once a day   . mineral oil-hydrophil petrolat (AQUAPHOR) Ointment Apply topically Once per day as needed   . mirtazapine (REMERON) 15 mg Oral Tablet Take 1 Tablet (15 mg total) by mouth Every night   . moxifloxacin (VIGAMOX) 0.5 % Ophthalmic Drops FOR USE IN THE SURGICAL SUITE AND 4 TIMES A DAY INTO RIGHT EYE AS DIRECTED (Patient not taking: Reported on 12/29/2021)   . neomycin-polymyxin-dexamethasone (MAXITROL) 3.5 mg/g-10,000 unit/g-0.1 % Ophthalmic Ointment Apply to left eye Four times a day (Patient not taking: Reported on 12/29/2021)   . nitroGLYCERIN (NITROSTAT) 0.4 mg Sublingual Tablet, Sublingual 1 Tab (0.4 mg total) by Sublingual route Every 5 minutes as needed for Chest pain for up to 3 doses for 3 doses over 15 minutes   . nystatin (NYSTOP) 100,000 unit/gram Powder Apply once or twice daily to PREVENT rash under breasts   . pantoprazole (PROTONIX) 40 mg Oral Tablet, Delayed Release (E.C.) Take 1 Tablet (40 mg total) by mouth Once a day   . prednisoLONE acetate (PRED FORTE) 1 % Ophthalmic Drops, Suspension FOR USE IN THE SURGICAL SUITE AND 4 TIMES A DAY INTO RIGHT EYE AS DIRECTED (Patient not taking: Reported on 12/29/2021)     Allergies:   Allergies   Allergen Reactions   . Cymbalta [Duloxetine]      Rash on feet and hands   . Blue Dye      Told to avoid due to patch test result by Abundio Miu   . Flavoring Agent  Other Adverse Reaction (Add comment)     Patient unsure of reaction   . Lipitor [Atorvastatin] Myalgia     Muscle pain   . Nickel      Blisters  "Allergic to all kinds of metal"    . Codeine Itching     RASH   . Mobic [Meloxicam] Nausea/ Vomiting     Nervous and shakes    . Naprosyn [Naproxen] Nausea/ Vomiting   .  Nsaids (Non-Steroidal Anti-Inflammatory Drug) Nausea/ Vomiting     Immunizations: Tetanus    Family History:   Family Medical History:      Problem Relation (Age of Onset)    Bipolar Disorder Daughter, Paternal Uncle    Breast Cancer Paternal Aunt, Paternal Aunt, Paternal Aunt, Paternal Aunt, Paternal Aunt, Other    Cancer Paternal Aunt, Paternal Aunt, Paternal Aunt, Paternal Aunt, Paternal Aunt, Other (35)    Congestive Heart Failure Father (50)    Coronary Artery Disease Father, Mother (22)    Diabetes Mother    Heart Attack Father, Sister    High Cholesterol Mother    Hypertension (High Blood Pressure) Mother, Brother, Sister, Sister    Kidney Disease Sister    Leukemia Paternal Uncle    Stroke Paternal Grandmother    Thyroid Disease Sister          No history of malignant hyperthermia  No history of anesthesia complications    Social History:   Social History     Tobacco Use   . Smoking status: Former     Packs/day: 2.00     Years: 40.00     Pack years: 80.00     Types: Cigarettes     Quit date: 01/22/2005     Years since quitting: 17.0   . Smokeless tobacco: Never   Substance Use Topics   . Alcohol use: No     Alcohol/week: 0.0 standard drinks     Recreational Drug Use:  Negative     ROS:   Bleeding: negative   Chest Pain: negative   Shortness of Breath with Exertion: negative   GERD: stable  No loose teeth    Functional Capacity:   Can patient perform 4 METS (metabolic energy expenditure unit) without cardiac symptoms. Commonly used items are the ability to walk up 1 flight of stairs holding a bag of groceries or the ability to walk on level ground at 4 miles per hour (or 1 mile in 15 minutes). If patient has good functional capacity (4 METS or higher) an intermediate risk procedure can proceed without further evaluation. If patient has symptoms or if functional capacity cannot be properly evaluated, the next step is to evaluate clinical risk factors.   Patient can walk up one flight of stairs holding a bag of groceries without experiencing chest pain or pressure.   Patient can walk on level ground at 4 miles per hour without experiencing chest  pain or pressure.     Physical Exam:   Vitals:    02/01/22 1408   BP: 135/84   Pulse: 93   Resp: 18   Temp: 36.5 C (97.7 F)   TempSrc: Temporal   SpO2: 96%   Weight: 98 kg (216 lb 0.8 oz)   Height: 1.651 m (5\' 5" )   BMI: 36.03             Labs/Studies:   Scheduled for preoperative testing at the POC.     ASSESSMENT/PLAN:   1. Preoperative Consultation for LLL excision of lesion transcutaneous approach. Patient has 0  risk factors for major cardiac complications by the Revised Goldman Cardiac Risk Index. This puts him/her at approximately 0.4% risk of a major cardiac complication during the planned procedure. Patient has been informed of this risk, is willing to proceed, and thus is medically cleared for the planned procedure. Of note, preop labs have not been completed, thus creatinine cannot be reviewed.    2. Reason for surgery: lesion of left  lower eyelid   3. Other issues: chronic pain, neuropathy, hyperlipidemia, depression/ anxiety     Revised Goldman Cardiac Risk Index:  Six independent predictors of major cardiac complications (defined as myocardial infarction, pulmonary edema, ventricular fibrillation or primary cardiac arrest, and complete heart block)  High-risk type of surgery (intraperitoneal, intrathoracic, or suprainguinal vascular surgery)   History of ischemic heart disease (history of MI or a positive exercise test, current complaint of chest pain considered to be secondary to myocardial ischemia, use of nitrate therapy, or ECG with pathological Q waves; do not count prior coronary revascularization procedure unless one of the other criteria for ischemic heart disease is present)   History of HF   History of cerebrovascular disease   Diabetes mellitus requiring treatment with insulin   Preoperative serum creatinine >2.0 mg/dL (914 mol/L)   These predictors were then validated in a cohort of 1422 patients. The predictive value was significant in all types of elective major noncardiac surgery  except for abdominal aortic aneurysm surgery (show figure 1).  The risk of major cardiac complications varied according to the number of risk factors. If one includes only cardiac death, nonfatal MI, and nonfatal cardiac arrest as major cardiac events in the [place] cohort, the following rates of adverse outcomes were seen [7]:  No risk factors -- 0.4 percent (95% CI 0.1-0.8 percent)   One risk factor -- 1.0 percent (95% CI 0.5-1.4 percent) (Consider noninvasive cardiac testing)  Two risk factors -- 2.4 percent (95% CI 1.3-3.5 percent) (Consider noninvasive cardiac testing)  Three or more risk factors -- 5.4 percent (95% CI 2.8-7.9 percent)       Laquentin Loudermilk Dalton, PA-C  02/01/2022, 14:20

## 2022-02-03 ENCOUNTER — Inpatient Hospital Stay (HOSPITAL_BASED_OUTPATIENT_CLINIC_OR_DEPARTMENT_OTHER)
Admission: RE | Admit: 2022-02-03 | Discharge: 2022-02-03 | Disposition: A | Discharge status: Home / Self Care | Payer: MEDICAID | Source: Ambulatory Visit

## 2022-02-03 ENCOUNTER — Ambulatory Visit: Payer: MEDICAID | Attending: Ophthalmology | Admitting: Ophthalmology

## 2022-02-03 ENCOUNTER — Other Ambulatory Visit: Payer: Self-pay

## 2022-02-03 ENCOUNTER — Encounter (INDEPENDENT_AMBULATORY_CARE_PROVIDER_SITE_OTHER): Payer: Self-pay | Admitting: Ophthalmology

## 2022-02-03 DIAGNOSIS — H538 Other visual disturbances: Secondary | ICD-10-CM

## 2022-02-03 DIAGNOSIS — Z9889 Other specified postprocedural states: Secondary | ICD-10-CM | POA: Insufficient documentation

## 2022-02-03 DIAGNOSIS — H2513 Age-related nuclear cataract, bilateral: Secondary | ICD-10-CM | POA: Insufficient documentation

## 2022-02-03 NOTE — Progress Notes (Addendum)
[facility]  [place] [address]  Operated by [facility]         Patient Name: [name]  MRN#: [id number]  Frank: 10-28-1955    Date of Service: 02/03/2022    Chief Complaint    Follow Up - Retina Problem         [name] is a 66 y.o. female who presents today for evaluation/consultation of:  HPI    Follow up 6 weeks DFE OCT OU  POM #2 s/p PPV/FTMH repair/SF6 OD on 11/02/21  s/p LLL and conj lesion removal (12/22/21) with Dr. Alfonse Spruce.     Pt states; VA remains the same, no changes. Denies any new flashes, floaters or pain. Irritation in OD.       Last edited by Tessie Fass on 02/03/2022  9:38 AM.        ROS    Positive for: Eyes  Negative for: Constitutional, Gastrointestinal, Neurological, Skin, Genitourinary, Musculoskeletal, HENT, Endocrine, Cardiovascular, Respiratory, Psychiatric, Allergic/Imm, Heme/Lymph  Last edited by Tessie Fass on 02/03/2022  9:33 AM.         All other systems Negative    Tessie Fass  02/03/2022, 09:38        Base Eye Exam     Visual Acuity (Snellen - Linear)       Right Left    Dist cc 20/100 20/20 -2    Dist ph cc 20/70     Correction: Glasses          Tonometry (ICARE, 9:45 AM)       Right Left    Pressure 10 12          Pupils       Pupils APD    Right PERRL None    Left PERRL None          Extraocular Movement       Right Left     Full Full          Neuro/Psych     Oriented x3: Yes    Mood/Affect: Normal          Dilation     Right eye: 2.5% Phenylephrine, 0.5% Proparacaine, 1.0% Mydriacyl @ 9:45 AM   Pt only wanted OD dilated due to driving.              Slit Lamp and Fundus Exam     External Exam       Right Left    External Normal Normal          Slit Lamp Exam       Right Left    Lids/Lashes Normal persistent bump    Conjunctiva/Sclera White and quiet White and quiet    Cornea Clear Clear    Anterior Chamber Deep and quiet Deep and quiet    Iris Round and reactive Round and  reactive    Lens 2+ NS 1+ NS    Anterior Vitreous Normal Normal          Fundus Exam       Right Left    Disc Normal Normal    C/D Ratio 0.35 0.3    Macula s/p peel, attached Normal    Vessels Normal Normal    Periphery Normal Normal                MD Addition to HPI: Pt reports stable vision. Denies any worsening flashes, floaters, peripheral  vision changes or eye pain.           ENCOUNTER DIAGNOSES     ICD-10-CM   1. s/p PPV/FTMH repair/SF6 OD on 11/02/21  Z98.890   2. Nuclear sclerotic cataract of both eyes  H25.13     Orders Placed This Encounter   Procedures   . OPH OCT BI       Ophthalmic Plan of Care:    [place] OD>OS  - likely vs- VA 20/70 OD today  - will refer to Dr. Laurance Flatten next available     s/p PPV/FTMH repair/SF6 OD on 11/02/21  - mac hole closed today with persistent outer retinal irregularities  - if subfoveal irregularities/SRF progress--consider Trusopt   - will continue to monitor, fu in 6 months      Follow up:    I have asked [name] to follow up 6 months dfe/oct OU          I have seen and examined the above patient. I discussed the above diagnoses listed in the assessment and the above ophthalmic plan of care with the patient and patient's family. All questions were answered. I reviewed and, when necessary, made changes to the technician/resident note, documented ophthalmology exam, chief complaint, history of present illness, allergies, review of systems, past medical, past surgical, family and social history. I personally reviewed and interpreted all testing and/or imaging performed at this visit and agree with the resident's or fellow's interpretation. Any exceptions/additions are edited/noted in the relevant encounter fields.      I am scribing for, and in the presence of Dr. Gala Murdoch for services provided on 02/03/2022.  Lanell Matar, SCRIBE   Lanell Matar, [place]  02/03/2022, 10:23    When a scribe documentaion is present , I attest that I personally performed the services described in  this documentation, as scribed  in my presence, and it is both accurate  and complete.  When a resident or fellow documentation is present, I attest that I saw and examined the patient.  I reviewed the resident's note.  I agree with the findings and plan of care as documented in the resident's note.  Any exceptions/additions are edited/noted.  When performed, I also attest that I reviewed the image(s)  and testing(s)  and agree with the interpretation and report as documented by the resident/fellow.  Exceptions as noted.    Con Memos, MD

## 2022-02-03 NOTE — Addendum Note (Signed)
Addended by: Con Memos on: 02/03/2022 12:42 PM     Modules accepted: Level of Service

## 2022-02-04 ENCOUNTER — Encounter (HOSPITAL_COMMUNITY): Payer: Self-pay | Admitting: Student in an Organized Health Care Education/Training Program

## 2022-02-04 ENCOUNTER — Ambulatory Visit (INDEPENDENT_AMBULATORY_CARE_PROVIDER_SITE_OTHER): Payer: MEDICAID | Admitting: Student in an Organized Health Care Education/Training Program

## 2022-02-04 VITALS — BP 128/80 | HR 82 | Ht 65.0 in | Wt 216.7 lb

## 2022-02-04 DIAGNOSIS — F332 Major depressive disorder, recurrent severe without psychotic features: Secondary | ICD-10-CM

## 2022-02-04 DIAGNOSIS — Z6836 Body mass index (BMI) 36.0-36.9, adult: Secondary | ICD-10-CM

## 2022-02-04 DIAGNOSIS — F411 Generalized anxiety disorder: Secondary | ICD-10-CM

## 2022-02-04 DIAGNOSIS — F4310 Post-traumatic stress disorder, unspecified: Secondary | ICD-10-CM

## 2022-02-04 DIAGNOSIS — F331 Major depressive disorder, recurrent, moderate: Secondary | ICD-10-CM

## 2022-02-04 MED ORDER — BUPROPION HCL XL 450 MG 24 HR TABLET, EXTENDED RELEASE
450.0000 mg | ORAL_TABLET | Freq: Every day | ORAL | 3 refills | Status: DC
Start: 2022-02-04 — End: 2022-03-11

## 2022-02-04 MED ORDER — MIRTAZAPINE 15 MG TABLET
15.0000 mg | ORAL_TABLET | Freq: Every evening | ORAL | 2 refills | Status: DC
Start: 2022-02-04 — End: 2022-03-11

## 2022-02-04 MED ORDER — FLUOXETINE 20 MG TABLET
ORAL_TABLET | ORAL | 2 refills | Status: DC
Start: 2022-02-04 — End: 2022-03-11
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
## 2022-02-10 ENCOUNTER — Other Ambulatory Visit: Payer: Self-pay

## 2022-02-10 MED ORDER — ERYTHROMYCIN 5 MG/GRAM (0.5 %) EYE OINTMENT
TOPICAL_OINTMENT | OPHTHALMIC | 0 refills | Status: DC
Start: 2022-02-10 — End: 2022-02-17
  Filled 2022-02-10: qty 3.5, 4d supply, fill #0

## 2022-02-15 ENCOUNTER — Encounter (HOSPITAL_COMMUNITY): Payer: Self-pay

## 2022-02-15 ENCOUNTER — Inpatient Hospital Stay
Admission: RE | Admit: 2022-02-15 | Discharge: 2022-02-15 | Disposition: A | Discharge status: Home / Self Care | Payer: MEDICAID | Source: Ambulatory Visit | Attending: Ophthalmology | Admitting: Ophthalmology

## 2022-02-15 ENCOUNTER — Other Ambulatory Visit (INDEPENDENT_AMBULATORY_CARE_PROVIDER_SITE_OTHER): Payer: MEDICAID | Admitting: Rheumatology

## 2022-02-15 ENCOUNTER — Inpatient Hospital Stay (HOSPITAL_COMMUNITY)
Admission: RE | Admit: 2022-02-15 | Discharge: 2022-02-15 | Disposition: A | Discharge status: Home / Self Care | Payer: MEDICAID | Source: Ambulatory Visit

## 2022-02-15 ENCOUNTER — Other Ambulatory Visit: Payer: Self-pay

## 2022-02-15 DIAGNOSIS — I10 Essential (primary) hypertension: Secondary | ICD-10-CM

## 2022-02-15 DIAGNOSIS — E890 Postprocedural hypothyroidism: Secondary | ICD-10-CM

## 2022-02-15 DIAGNOSIS — E785 Hyperlipidemia, unspecified: Secondary | ICD-10-CM

## 2022-02-15 LAB — LIPID PANEL
CHOL/HDL RATIO: 4.9
CHOLESTEROL: 205 mg/dL — ABNORMAL HIGH (ref 100–200)
HDL CHOL: 42 mg/dL — ABNORMAL LOW (ref >=50)
LDL CALC: 137 mg/dL — ABNORMAL HIGH (ref <100)
NON-HDL: 163 mg/dL (ref <=190)
TRIGLYCERIDES: 145 mg/dL (ref <150)
VLDL CALC: 26 mg/dL (ref <30)

## 2022-02-15 LAB — COMPREHENSIVE METABOLIC PANEL, NON-FASTING
ALBUMIN: 3.5 g/dL (ref 3.4–4.8)
ALKALINE PHOSPHATASE: 64 U/L (ref 50–130)
ALT (SGPT): 22 U/L (ref 8–22)
ANION GAP: 8 mmol/L (ref 4–13)
AST (SGOT): 16 U/L (ref 8–45)
BILIRUBIN TOTAL: 0.3 mg/dL (ref 0.3–1.3)
BUN/CREA RATIO: 14 (ref 6–22)
BUN: 14 mg/dL (ref 8–25)
CALCIUM: 8.9 mg/dL (ref 8.8–10.2)
CHLORIDE: 109 mmol/L (ref 96–111)
CO2 TOTAL: 23 mmol/L (ref 23–31)
CREATININE: 1.03 mg/dL (ref 0.60–1.05)
ESTIMATED GFR: 61 mL/min/BSA (ref >=60)
GLUCOSE: 104 mg/dL (ref 65–125)
POTASSIUM: 3.9 mmol/L (ref 3.5–5.1)
PROTEIN TOTAL: 6.8 g/dL (ref 6.0–8.0)
SODIUM: 140 mmol/L (ref 136–145)

## 2022-02-15 LAB — POC BLOOD GLUCOSE (RESULTS): GLUCOSE, POC: 105 mg/dl (ref 70–105)

## 2022-02-15 LAB — THYROID STIMULATING HORMONE (SENSITIVE TSH): TSH: 1.769 u[IU]/mL (ref 0.430–3.550)

## 2022-02-15 NOTE — Anesthesia Preprocedure Evaluation (Addendum)
ANESTHESIA PRE-OP EVALUATION  [name]  Planned Procedure: EXCISION LESION EYELID (Left)  Review of Systems     anesthesia history negative     patient summary reviewed  nursing notes reviewed        Pulmonary   past history of smoking ,   Cardiovascular    Hypertension, well controlled, peripheral edema, CAD, ECG reviewed and hyperlipidemia , Exercise Tolerance: > or = 4 METS        GI/Hepatic/Renal    GERD and well controlled        Endo/Other    obesity,      Neuro/Psych/MS    headaches, anxiety, depression    peripheral neuropathy,  Cancer  CA,               Physical Assessment      Airway       Mallampati: II    TM distance: >3 FB    Neck ROM: full  Mouth Opening: good.            Dental           (+) upper dentures, missing           Pulmonary    Breath sounds clear to auscultation       Cardiovascular    Rhythm: regular  Rate: Normal  (+) peripheral edema present        Other findings            Plan  ASA 3     Planned anesthesia type: general     general anesthesia with laryngeal mask airway                PONV/POV Plan:  I plan to administer pharmcologic prophalaxis antiemetics  Intravenous induction     Anesthesia issues/risks discussed are: Dental Injuries, Post-op Cognitive Dysfunction, Post-op Agitation/Tantrum, Blood Loss and Sore Throat.  Anesthetic plan and risks discussed with patient and other  Signed consent obtained        Use of blood products discussed with patient and other who consented to blood products.     Patient's NPO status is appropriate for Anesthesia.           Plan discussed with CRNA.    (R/B Discussed )             DOS: 02/17/22     LOS: 65 min    EKG: 10/22/21  Normal sinus rhythm  Normal ECG    TTE 02/16/2017  - Left ventricle: The cavity size was normal. Wall thickness was   increased in a pattern of mild LVH. Systolic function was normal.   The estimated ejection fraction was in the range of 60% to 65%.   Wall motion was normal; there were no regional wall  motion   abnormalities. Doppler parameters are consistent with abnormal   left ventricular relaxation (grade 1 diastolic dysfunction).   - Right ventricle: The cavity size was normal. Systolic function   was normal.     Left heart cath 11/10/2012  IMPRESSION:  1.  Normal left ventricular function.  2.  Mild coronary artery disease.    Labs: CMP ordered by MD    Patient provided anesthesia consent in [place]. Instructed to review consent prior to OR date. Educated that consent will be signed morning of surgery with anesthesiologist.  Patient instructed to avoid NSAIDs, vitamins, supplements, and fish oil 7 days prior to surgery  Patient instructed to take the following medications the  morning of procedure: as needed Tylenol, Wellbutrin, Fluoxetine, Gabapentin, Protonix, as needed zyrtec, as needed claritin

## 2022-02-17 ENCOUNTER — Encounter (HOSPITAL_COMMUNITY): Payer: Self-pay | Admitting: Ophthalmology

## 2022-02-17 ENCOUNTER — Inpatient Hospital Stay
Admission: RE | Admit: 2022-02-17 | Discharge: 2022-02-17 | Disposition: A | Discharge status: Home / Self Care | Payer: MEDICAID | Source: Ambulatory Visit | Attending: Ophthalmology | Admitting: Ophthalmology

## 2022-02-17 ENCOUNTER — Ambulatory Visit (HOSPITAL_BASED_OUTPATIENT_CLINIC_OR_DEPARTMENT_OTHER): Payer: MEDICAID | Admitting: Certified Registered"

## 2022-02-17 ENCOUNTER — Encounter (HOSPITAL_COMMUNITY): Payer: MEDICAID | Admitting: Ophthalmology

## 2022-02-17 ENCOUNTER — Encounter (HOSPITAL_COMMUNITY)
Admission: RE | Disposition: A | Discharge status: Home / Self Care | Payer: Self-pay | Source: Ambulatory Visit | Attending: Ophthalmology

## 2022-02-17 ENCOUNTER — Ambulatory Visit (HOSPITAL_COMMUNITY): Payer: MEDICAID | Admitting: Student in an Organized Health Care Education/Training Program

## 2022-02-17 ENCOUNTER — Other Ambulatory Visit: Payer: Self-pay

## 2022-02-17 DIAGNOSIS — H019 Unspecified inflammation of eyelid: Secondary | ICD-10-CM

## 2022-02-17 DIAGNOSIS — Z6834 Body mass index (BMI) 34.0-34.9, adult: Secondary | ICD-10-CM

## 2022-02-17 SURGERY — EXCISION LESION EYELID
Anesthesia: General | Laterality: Left | Wound class: Clean Wound: Uninfected operative wounds in which no inflammation occurred

## 2022-02-17 MED ORDER — DEXAMETHASONE SODIUM PHOSPHATE 4 MG/ML INJECTION SOLUTION
Freq: Once | INTRAMUSCULAR | Status: DC | PRN
Start: 2022-02-17 — End: 2022-02-17
  Administered 2022-02-17: 4 mg via INTRAVENOUS

## 2022-02-17 MED ORDER — SODIUM CHLORIDE 0.9 % (FLUSH) INJECTION SYRINGE
2.0000 mL | INJECTION | Freq: Three times a day (TID) | INTRAMUSCULAR | Status: DC
Start: 2022-02-17 — End: 2022-02-17

## 2022-02-17 MED ORDER — SODIUM CHLORIDE 0.9% FLUSH BAG - 250 ML
INTRAVENOUS | Status: DC | PRN
Start: 2022-02-17 — End: 2022-02-17

## 2022-02-17 MED ORDER — FENTANYL (PF) 50 MCG/ML INJECTION SOLUTION
12.5000 ug | INTRAMUSCULAR | Status: DC | PRN
Start: 2022-02-17 — End: 2022-02-17

## 2022-02-17 MED ORDER — ONDANSETRON HCL (PF) 4 MG/2 ML INJECTION SOLUTION
Freq: Once | INTRAMUSCULAR | Status: DC | PRN
Start: 2022-02-17 — End: 2022-02-17
  Administered 2022-02-17: 4 mg via INTRAVENOUS

## 2022-02-17 MED ORDER — FENTANYL (PF) 50 MCG/ML INJECTION SOLUTION
25.0000 ug | INTRAMUSCULAR | Status: DC | PRN
Start: 2022-02-17 — End: 2022-02-17

## 2022-02-17 MED ORDER — LACTATED RINGERS INTRAVENOUS SOLUTION
INTRAVENOUS | Status: DC | PRN
Start: 2022-02-17 — End: 2022-02-17
  Administered 2022-02-17: 0 via INTRAVENOUS

## 2022-02-17 MED ORDER — ERYTHROMYCIN 5 MG/GRAM (0.5 %) EYE OINTMENT
TOPICAL_OINTMENT | Freq: Once | OPHTHALMIC | Status: DC | PRN
Start: 2022-02-17 — End: 2022-02-17

## 2022-02-17 MED ORDER — SODIUM CHLORIDE 0.9 % (FLUSH) INJECTION SYRINGE
2.0000 mL | INJECTION | INTRAMUSCULAR | Status: DC | PRN
Start: 2022-02-17 — End: 2022-02-17

## 2022-02-17 MED ORDER — LIDOCAINE 20 MG/ML (2 %)-EPINEPHRINE 1:100,000 INJECTION SOLUTION
1.0000 mL | Freq: Once | INTRAMUSCULAR | Status: DC
Start: 2022-02-17 — End: 2022-02-17

## 2022-02-17 MED ORDER — LIDOCAINE 20 MG/ML (2 %)-EPINEPHRINE 1:100,000 INJECTION SOLUTION
15.0000 mL | Freq: Once | INTRAMUSCULAR | Status: DC | PRN
Start: 2022-02-17 — End: 2022-02-17
  Administered 2022-02-17: 0.5 mL via INTRADERMAL

## 2022-02-17 MED ORDER — PROPARACAINE 0.5 % EYE DROPS
1.0000 [drp] | Freq: Once | OPHTHALMIC | Status: DC | PRN
Start: 2022-02-17 — End: 2022-02-17
  Administered 2022-02-17: 1 [drp] via OPHTHALMIC

## 2022-02-17 MED ORDER — TRIAMCINOLONE ACETONIDE 40 MG/ML SUSPENSION FOR INJECTION
INTRAMUSCULAR | Status: AC
Start: 2022-02-17 — End: 2022-02-17
  Filled 2022-02-17: qty 5

## 2022-02-17 MED ORDER — DEXTROSE 5% IN WATER (D5W) FLUSH BAG - 250 ML
INTRAVENOUS | Status: DC | PRN
Start: 2022-02-17 — End: 2022-02-17

## 2022-02-17 MED ORDER — PROPOFOL 10 MG/ML IV BOLUS
INJECTION | Freq: Once | INTRAVENOUS | Status: DC | PRN
Start: 2022-02-17 — End: 2022-02-17
  Administered 2022-02-17: 130 mg via INTRAVENOUS

## 2022-02-17 MED ORDER — FENTANYL (PF) 50 MCG/ML INJECTION SOLUTION
INTRAMUSCULAR | Status: AC
Start: 2022-02-17 — End: 2022-02-17
  Filled 2022-02-17: qty 2

## 2022-02-17 MED ORDER — LIDOCAINE (PF) 3.5 % EYE GEL
2.0000 [drp] | Freq: Once | OPHTHALMIC | Status: DC | PRN
Start: 2022-02-17 — End: 2022-02-17

## 2022-02-17 MED ORDER — FENTANYL (PF) 50 MCG/ML INJECTION SOLUTION
Freq: Once | INTRAMUSCULAR | Status: DC | PRN
Start: 2022-02-17 — End: 2022-02-17
  Administered 2022-02-17: 25 ug via INTRAVENOUS

## 2022-02-17 MED ORDER — MIDAZOLAM 1 MG/ML INJECTION SOLUTION
INTRAMUSCULAR | Status: AC
Start: 2022-02-17 — End: 2022-02-17
  Filled 2022-02-17: qty 2

## 2022-02-17 MED ORDER — LIDOCAINE (PF) 100 MG/5 ML (2 %) INTRAVENOUS SYRINGE
INJECTION | Freq: Once | INTRAVENOUS | Status: DC | PRN
Start: 2022-02-17 — End: 2022-02-17
  Administered 2022-02-17: 100 mg via INTRAVENOUS

## 2022-02-17 MED ORDER — LACTATED RINGERS INTRAVENOUS SOLUTION
INTRAVENOUS | Status: DC
Start: 2022-02-17 — End: 2022-02-17

## 2022-02-17 MED ORDER — KETOROLAC 30 MG/ML (1 ML) INJECTION SOLUTION
Freq: Once | INTRAMUSCULAR | Status: DC | PRN
Start: 2022-02-17 — End: 2022-02-17
  Administered 2022-02-17: 30 mg via INTRAVENOUS

## 2022-02-17 MED ORDER — TRIAMCINOLONE ACETONIDE 10 MG/ML SUSPENSION FOR INJECTION
40.0000 mg | Freq: Once | INTRAMUSCULAR | Status: AC
Start: 2022-02-17 — End: 2022-02-17
  Administered 2022-02-17: 0.5 mg via INTRAMUSCULAR
  Filled 2022-02-17: qty 5

## 2022-02-17 MED ORDER — MIDAZOLAM (PF) 1 MG/ML INJECTION SOLUTION
Freq: Once | INTRAMUSCULAR | Status: DC | PRN
Start: 2022-02-17 — End: 2022-02-17
  Administered 2022-02-17: 2 mg via INTRAVENOUS

## 2022-02-17 MED ORDER — ACETAMINOPHEN 1,000 MG/100 ML (10 MG/ML) INTRAVENOUS SOLUTION
Freq: Once | INTRAVENOUS | Status: DC | PRN
Start: 2022-02-17 — End: 2022-02-17
  Administered 2022-02-17: 1000 mg via INTRAVENOUS

## 2022-02-17 MED ORDER — ERYTHROMYCIN 5 MG/GRAM (0.5 %) EYE OINTMENT
TOPICAL_OINTMENT | Freq: Four times a day (QID) | OPHTHALMIC | 0 refills | Status: AC
Start: 2022-02-17 — End: 2022-02-24

## 2022-02-17 MED ORDER — PROPARACAINE 0.5 % EYE DROPS
1.0000 [drp] | Freq: Once | OPHTHALMIC | Status: DC | PRN
Start: 2022-02-17 — End: 2022-02-17

## 2022-02-17 SURGICAL SUPPLY — 28 items
ARMBOARD IV FM PSTNR (IV TUBING & ACCESSORIES) ×1
ARMBRD POSITION 20X8X2IN DVN FOAM (IV TUBING & ACCESSORIES) ×1 IMPLANT
CLOSURE SKIN STRIPS 1/2X4IN_R1547 6/PK 50PK/BX (WOUND CARE/ENTEROSTOMAL SUPPLY) ×1
CONV USE 337896 - PACK SURG EYE PLASTIC STRL DISP LF (CUSTOM TRAYS & PACK) ×1 IMPLANT
CONV USE ITEM 32095 - NEEDLE HYPO  30GA .5IN STD REG BVL LF (MED SURG SUPPLIES) ×1 IMPLANT
CORD BIPOLAR 12IN SILVERGLIDE STR CABLE STRL LF  DISP (ENDOSCOPIC SUPPLIES) ×1 IMPLANT
CORD BIPOLAR 12IN SILVERGLIDE_CABLE STRL LF DISP (INSTRUMENTS ENDOMECHANICAL) ×1
COVER WAND RFD STRL 50EA/CS_01-0020 (DRAPE/PACKS/SHEETS/OR TOWEL) ×1
COVER WND RF DETECT STRL CLR EQP (DRAPE/PACKS/SHEETS/OR TOWEL) ×1 IMPLANT
CUSTOM PACK PLASTICS EYE (CUSTOM TRAYS & PACK) ×1
DROPPER MED GLS 3IN 1.5ML STR_PIP STRL LF (MEDICATIONS/SOLUTIONS) ×1
DROPPER MED GLS STR STY PIP RUB BULB STRL DISP 3IN (MEDICATIONS/SOLUTIONS) ×1 IMPLANT
DUPE USE ITEM 319386 - SUTURE SILK 6-0 S-14 PERMAHAND_SABRELOC 18IN BLK 2 ARM BRD (SUTURE/WOUND CLOSURE) IMPLANT
DUPE USE ITEM 319458 - SUTURE 6-0 TG100-8 VICRYL MICR_OP 18IN VIOL 2 ARM BRD COAT (SUTURE/WOUND CLOSURE) IMPLANT
DUPE USE ITEM 319492 - SUTURE CHR GUT 7-0 TG140-8 MIC_ROP 18IN BLU 2 ARM MONOF ABS (SUTURE/WOUND CLOSURE) IMPLANT
GARTER EYE ASST_28-6536 (GLOVES AND ACCESSORIES) ×2 IMPLANT
NEEDLE HYPO  30GA .5IN STD REG BVL LF (MED SURG SUPPLIES) ×1
NEEDLE HYPO 30GA .5IN STD REG_BVL LF (MED SURG SUPPLIES) ×1
SHIELD EYE 76.2X60.3MM ASST AL EY GRTR FOX CVR (OPHTHALMIC SUPPLIES (NOT LENS)) ×1 IMPLANT
SHIELD EYE 76.2X60.3MM ASST AL_EY GRTR FOX CVR (OPTHALMIC SUPPLIES (NOT LENS)) ×1
SPONGE GAUZE 4X4IN MDCHC COTTON 12 PLY TY 7 LF  STRL DISP (WOUND CARE SUPPLY) ×1 IMPLANT
SPONGE GAUZE 4X4IN MDCHC COTTO_N 12 PLY TY 7 LF STRL DISP (WOUND CARE/ENTEROSTOMAL SUPPLY) ×1
STRIP 4X.5IN STRSTRP PLSTR REINF SKNCLS WHT STRL LF (WOUND CARE SUPPLY) ×1 IMPLANT
SUTURE 6-0 PS-6 VICRYL 18IN UNDYED BRD COAT ABS (SUTURE/WOUND CLOSURE) IMPLANT
SUTURE 6-0 PS-6 VICRYL 18IN UN_DYED BRD COAT ABS (SUTURE/WOUND CLOSURE)
SUTURE 6-0 TG100-8 VICRYL MICR_OP 18IN VIOL 2 ARM BRD COAT (SUTURE/WOUND CLOSURE)
SUTURE CHR GUT 7-0 TG140-8 MIC_ROP 18IN BLU 2 ARM MONOF ABS (SUTURE/WOUND CLOSURE)
SUTURE SILK 6-0 S-14 PERMAHAND_SABRELOC 18IN BLK 2 ARM BRD (SUTURE/WOUND CLOSURE)

## 2022-02-17 NOTE — H&P (Deleted)
[facility] OF OPHTHALMOLOGY - H&P UPDATE FORM    PATIENT NAME:  [name] NAME:  [id number]  DATE OF SERVICE:  02/17/2022  DATE OF BIRTH:  December 30, 1955    Outpatient Pre-surgical H&P updated the day of procedure.    1.  H&P (completed within 30 days of surgical procedure by Alvis Lemmings PA-C on 02/01/22) reviewed.    '[X]'$  H&P assessment remains unchanged based on completion of re-assessment    '[ ]'$  H&P assessment updated as follows:    '[ ]'$  Respiratory    '[ ]'$  Cardiovascular    '[ ]'$  GI    '[ ]'$  Neuro    '[ ]'$  Change in meds no    Comments      2.  Patient continues to be appropriate candidate for planned surgical procedure.  yes      Andre Lefort, DDS 02/17/2022, 10:03

## 2022-02-17 NOTE — Discharge Instructions (Signed)
SURGICAL DISCHARGE INSTRUCTIONS     Dr. [name], MD  performed your EXCISION LESION EYELID today at the [facility]:  Monday through Friday from 6 a.m. - 7 p.m.: [phone]  Between 7 p.m. - 6 a.m., weekends and holidays:  Call [facility] at [phone] or [phone].    PLEASE SEE WRITTEN HANDOUTS AS DISCUSSED BY YOUR NURSE:      SIGNS AND SYMPTOMS OF A WOUND / INCISION INFECTION   Be sure to watch for the following:  Increase in redness or red streaks near or around the wound or incision.  Increase in pain that is intense or severe and cannot be relieved by the pain medication that your doctor has given you.  Increase in swelling that cannot be relieved by elevation of a body part, or by applying ice, if permitted.  Increase in drainage, or if yellow / green in color and smells bad. This could be on a dressing or a cast.  Increase in fever for longer than 24 hours, or an increase that is higher than 101 degrees Fahrenheit (normal body temperature is 98 degrees Fahrenheit). The incision may feel warm to the touch.    **CALL YOUR DOCTOR IF ONE OR MORE OF THESE SIGNS / SYMPTOMS SHOULD OCCUR.    ANESTHESIA INFORMATION   ANESTHESIA -- ADULT PATIENTS:  You have received intravenous sedation / general anesthesia, and you may feel drowsy and light-headed for several hours. You may even experience some forgetfulness of the procedure. DO NOT DRIVE A MOTOR VEHICLE or perform any activity requiring complete alertness or coordination until you feel fully awake in about 24-48 hours. Do not drink alcoholic beverages for at least 24 hours. Do not stay alone, you must have a responsible adult available to be with you. You may also experience a dry mouth or nausea for 24 hours. This is a normal side effect and will disappear as the effects of the medication wear off.    REMEMBER   If you experience any difficulty breathing, chest pain, bleeding that you feel is excessive,  persistent nausea or vomiting or for any other concerns:  Call your physician Dr. Alfonse Spruce at [phone] or [phone]. You may also ask to have the Ophthalmology doctor on call paged. They are available to you 24 hours a day.    SPECIAL INSTRUCTIONS / COMMENTS       FOLLOW-UP APPOINTMENTS   Please call patient services at [phone] or [phone] to schedule a date / time of return. They are open Monday - Friday from 7:30 am - 5:00 pm.

## 2022-02-17 NOTE — Anesthesia Transfer of Care (Signed)
ANESTHESIA TRANSFER OF CARE   [name] is a 66 y.o. ,female, Weight: 98.3 kg (216 lb 11.4 oz)   had Procedure(s) with comments:  [place]  performed  02/17/22   Primary Service: [name], MD    Past Medical History:   Diagnosis Date   . Anxiety    . Arthritis    . Cancer (CMS HCC)     internal skin cancer   . Chronic pain     lower back   . Depression    . Dyspnea on exertion    . Essential hypertension 02/16/2017    patient denies   . GERD (gastroesophageal reflux disease)     controlled w/med   . Headache    . Heart murmur 1979    benign   . Hyperlipidemia LDL goal < 100 08/29/2013    denies   . MINOR CAD (coronary artery disease) 11/10/2012    no treatment other than cholesterol medications; follows regularly with K.Dalton, PA   . Multinodular goiter    . Muscle weakness     neck    . Obesity    . Peripheral edema    . Rash     face only eczema   . Rheumatic fever     in 3474 no complications   . S/P thyroidectomy    . Squamous cell carcinoma 02/06/2015    right side of neck   . 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 Vaginal prolapse 2012    surgery improved significantly   . Wears glasses       Allergy History as of 02/17/22     CODEINE       Noted Status Severity Type Reaction    12/10/15 0941 Pantojas, [place]  Active Low  Itching    Comments: RASH     09/19/13 1430 Wallace Cullens Joy  Deleted Low  Itching    Comments: RASH     09/04/09 Ginette Pitman  Active   Itching    Comments: RASH     10/10/07   Active       Comments: RASH           STRAWBERRY       Noted Status Severity Type Reaction    03/18/11 1634 Johnnette Barrios  Deleted       10/10/08 [name], MD  Deleted       10/10/07   Active             MELOXICAM       Noted Status Severity Type Reaction    11/24/18 0857 Kisner, Bishop Limbo, RN 06/26/09 Active Low  Nausea/ Vomiting    Comments: Nervous and shakes      12/10/15 0941 Pantojas, Marita [place] 06/26/09 Active Low  Nausea/ Vomiting     06/26/09 Waldon Reining 06/26/09 Active   Nausea/ Vomiting          NICKEL       Noted Status Severity Type Reaction    07/19/19 0959 Linnell Fulling, RN 07/24/12 Active  Topical     Comments: Blisters  "Allergic to all kinds of metal"      11/24/18 0856 Kisner, Bishop Limbo, RN 07/24/12 Active  Topical     Comments: Blisters       07/24/12 1041 [name], MD 07/24/12 Active  Topical           BLUE DYE  Noted Status Severity Type Reaction    07/24/12 1041 [name], MD 07/24/12 Active  Topical     Comments: Told to avoid due to patch test result by Derm            DULOXETINE       Noted Status Severity Type Reaction    07/24/12 1052 [name], MD 07/24/12 Active Medium Systemic     Comments: Rash on feet and hands           NAPROXEN       Noted Status Severity Type Reaction    11/01/12 0913 Sanjuana Letters, MA 11/01/12 Active Low  Nausea/ Vomiting          ATORVASTATIN       Noted Status Severity Type Reaction    06/19/18 2149 Erskine Squibb, RX STUDENT 01/27/15 Active  Side Effect Myalgia    Comments: Muscle pain     01/27/15 1035 [name], MD 01/27/15 Active  Side Effect     Comments: Muscle pain           FLAVORING AGENT       Noted Status Severity Type Reaction    07/19/19 0958 Linnell Fulling, RN 05/03/17 Active    Other Adverse Reaction (Add comment)    Comments: Patient unsure of reaction     04/03/18 1905 Suella Grove, MA 05/03/17 Active             NSAIDS (NON-STEROIDAL ANTI-INFLAMMATORY DRUG)       Noted Status Severity Type Reaction    11/24/18 0857 Kisner, Bishop Limbo, RN 05/03/17 Active Low  Nausea/ Vomiting    04/03/18 1905 Suella [place], MA 05/03/17 Active                 I completed my transfer of care / handoff to the receiving personnel during which we discussed:  Access, Airway, All key/critical aspects of case discussed, Analgesia, Antibiotics, Expectation of post procedure, Fluids/Product, Gave opportunity for  questions and acknowledgement of understanding, Labs and PMHx    Post Location: PACU                                          Additional Info:Pt transported to PACU with supplementary oxygen via simple face mask. Report given to RN; all questions answered. VSS, airway patent.                          Last OR Temp: Temperature: 37 C (98.6 F)  ABG:  PCO2 (VENOUS)   Date Value Ref Range Status   02/19/2016 36.00 (L) 41.00 - 51.00 mm/Hg Final     PO2 (VENOUS)   Date Value Ref Range Status   02/19/2016 43.0 35.0 - 50.0 mm/Hg Final     POTASSIUM   Date Value Ref Range Status   02/15/2022 3.9 3.5 - 5.1 mmol/L Final   01/23/2015 4.3 3.5 - 5.1 mmol/L Final     KETONES   Date Value Ref Range Status   11/27/2020 Negative Negative mg/dL Final     WHOLE BLOOD POTASSIUM   Date Value Ref Range Status   02/19/2016 3.9 3.5 - 5.0 mmol/L Final     CALCIUM   Date Value Ref Range Status   02/15/2022 8.9 8.8 - 10.2 mg/dL Final   01/23/2015 9.8 8.5 - 10.4 mg/dL Final  Comment:     Test performed by [facility], [address], [place],  [place] [address]       Calculated P Axis   Date Value Ref Range Status   10/22/2021 69 degrees Final     Calculated R Axis   Date Value Ref Range Status   11/24/2018 -16 degrees Final     Calculated T Axis   Date Value Ref Range Status   10/22/2021 27 degrees Final     IONIZED CALCIUM   Date Value Ref Range Status   02/19/2016 1.19 1.10 - 1.36 mmol/L Final     GLUCOSE, POINT OF CARE   Date Value Ref Range Status   02/06/2015 104 70 - 105 mg/dL Final     HEMOGLOBIN   Date Value Ref Range Status   02/19/2016 13.9 12.0 - 18.0 g/dL Final     OXYHEMOGLOBIN   Date Value Ref Range Status   02/19/2016 83.5 (H) 40.0 - 80.0 % Final     CARBOXYHEMOGLOBIN   Date Value Ref Range Status   02/19/2016 1.9 0.0 - 2.5 % Final     MET-HEMOGLOBIN   Date Value Ref Range Status   02/19/2016 0.1 0.0 - 3.5 % Final     BASE EXCESS   Date Value Ref Range Status   02/19/2016 0.6 -3.0 - 3.0 mmol/L Final     BICARBONATE  (VENOUS)   Date Value Ref Range Status   02/19/2016 25.0 22.0 - 26.0 mmol/L Final     %FIO2 (VENOUS)   Date Value Ref Range Status   02/19/2016 28.0 % Final     Airway:* No LDAs found *  Blood pressure 114/68, pulse 69, temperature 37 C (98.6 F), resp. rate 14, height 1.693 m (5' 6.65"), weight 98.3 kg (216 lb 11.4 oz), SpO2 100 %, not currently breastfeeding.

## 2022-02-17 NOTE — Anesthesia Postprocedure Evaluation (Signed)
Anesthesia Post Op Evaluation    Patient: [name]  Procedure(s) with comments:  EXCISION LESION EYELID - TRANSCUTANEOUS APPROACH    Last Vitals:Temperature: 36.7 C (98.1 F) (02/17/22 1320)  Heart Rate: 70 (02/17/22 1320)  BP (Non-Invasive): 134/78 (02/17/22 1320)  Respiratory Rate: 20 (02/17/22 1320)  SpO2: 97 % (02/17/22 1320)    No notable events documented.    Patient is sufficiently recovered from the effects of anesthesia to participate in the evaluation and has returned to their pre-procedure level.  Patient location during evaluation: PACU       Patient participation: complete - patient participated  Level of consciousness: awake and alert and responsive to verbal stimuli    Pain management: adequate  Airway patency: patent    Anesthetic complications: no  Cardiovascular status: acceptable  Respiratory status: acceptable  Hydration status: acceptable  Patient post-procedure temperature: Pt Normothermic   PONV Status: Absent

## 2022-02-17 NOTE — Brief Op Note (Signed)
[facility] OF OPHTHALMOLOGY - BRIEF OP-NOTE      PATIENT NAME:  [place] NAME:  [id number]  DATE OF SERVICE:  02/17/2022  DATE OF BIRTH:  1956-01-13      PRE-OP DX:  Lesion of left lower lid    POST-OP DX:  Same    PROCEDURE:  Excision of left lower lid lesion 1cm  Intralesional Kenalog injection    Surgeon:  [name], MD    Resident:  Ponciano Ort, MD; Andre Lefort, DDS    ANESTHESIA:  General    ESTIMATED BLOOD LOSS:  < 5 cc    Wound Class: Clean Wound: Uninfected operative wounds in which no inflammation occurred    COMPLICATIONS:  No    SPECIMENS:  Left lower lid lesion    IMPLANTS:  None    PLEASE DISCHARGE PATIENT TO HOME WHEN [place] CRITERIA ARE MET    Gennie Alma. Willa Frater, MD 02/17/2022 11:53   Otolaryngology, PGY-1

## 2022-02-17 NOTE — H&P (Signed)
[facility]  H&P Note    Date: 02/17/2022  Name: [name], 66 y.o. female  MRN: [id number]  DOB: [birth year]    HPI: [name] is a 65 y.o. female who presents today for excision of left eyelid lesion.    Past Medical History:   Diagnosis Date   . Anxiety    . Arthritis    . Cancer (CMS HCC)     internal skin cancer   . Chronic pain     lower back   . Depression    . Dyspnea on exertion    . Essential hypertension 02/16/2017    patient denies   . GERD (gastroesophageal reflux disease)     controlled w/med   . Headache    . Heart murmur 1979    benign   . Hyperlipidemia LDL goal < 100 08/29/2013    denies   . MINOR CAD (coronary artery disease) 11/10/2012    no treatment other than cholesterol medications; follows regularly with K.Dalton, PA   . Multinodular goiter    . Muscle weakness     neck    . Obesity    . Peripheral edema    . Rash     face only eczema   . Rheumatic fever     in 9417 no complications   . S/P thyroidectomy    . Squamous cell carcinoma 02/06/2015    right side of neck   . 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 Vaginal prolapse 2012    surgery improved significantly   . Wears glasses          Past Surgical History:   Procedure Laterality Date   . Chalazion excision Left 12/22/2021   . Colonoscopy  01/06/2009   . Gastroscopy  03/05/2010   . Gastroscopy with biopsy  03/05/2010   . Hx adenoidectomy     . Hx ankle fracture tx  2007   . Hx appendectomy     . Hx cholecystectomy     . Hx colonoscopy     . Hx cystocele repair  09/24/2009   . Hx hand surgery  2012   . Hx heart catheterization     . Hx oophorectomy     . Hx parotidectomy      . Hx partial thyroidectomy Right 04/05/2019   . Hx tonsillectomy     . Hx total vaginal hysterectomy  1979   . Hx wrist fracture tx  2007   . Pars plana vitrectomy Right 11/02/2021   . Pb revise ulnar nerve at elbow Left 1979   . Pb upper gi endoscopy,biopsy  12/19/2007     Social History     Socioeconomic History   .  Marital status: Divorced   . Number of children: 3   Occupational History   . 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 Smoking status: Former     Packs/day: 2.00     Years: 40.00     Pack years: 80.00     Types: Cigarettes     Quit date: 01/22/2005     Years since quitting: 17.0   . Smokeless tobacco: Never   Vaping Use   . Vaping Use: Never used   Substance and Sexual Activity   . Alcohol use: No     Alcohol/week: 0.0 standard drinks   . Drug use: No   .  Sexual activity: Yes     Partners: Male   Other Topics Concern   . Uses Cane No   . Uses walker No   . Uses wheelchair No   . Right hand dominant Yes   . Left hand dominant No   . Ambidextrous No   . Ability to Walk 1 Flight of Steps without SOB/CP Yes   . Routine Exercise No   . Ability to Walk 2 Flight of Steps without SOB/CP Yes   . Unable to Ambulate No   . Total Care No   . Ability To Do Own ADL's Yes   . Uses Walker No   . Other Activity Level Yes     Comment: housework    . Uses Cane No   . Shift Work No   . Unusual Sleep-Wake Schedule No   Social History Narrative    Right handed.      Allergies   Allergen Reactions   . Cymbalta [Duloxetine]      Rash on feet and hands   . Blue Dye      Told to avoid due to patch test result by Simone Curia   . Flavoring Agent  Other Adverse Reaction (Add comment)     Patient unsure of reaction   . Lipitor [Atorvastatin] Myalgia     Muscle pain   . Nickel      Blisters  "Allergic to all kinds of metal"    . Codeine Itching     RASH   . Mobic [Meloxicam] Nausea/ Vomiting     Nervous and shakes    . Naprosyn [Naproxen] Nausea/ Vomiting   . Nsaids (Non-Steroidal Anti-Inflammatory Drug) Nausea/ Vomiting     Family Medical History:     Problem Relation (Age of Onset)    Bipolar Disorder Daughter, Paternal Uncle    Breast Cancer Paternal Aunt, Paternal Aunt, Paternal 41, Paternal 10, Paternal 52, Other    Cancer Paternal Aunt, Paternal 32, Paternal 39, Paternal  74, Paternal 26, Other (51)    Congestive Heart Failure Father (54)    Coronary Artery Disease Father, Mother (48)    Diabetes Mother    Heart Attack Father, Sister    High Cholesterol Mother    Hypertension (High Blood Pressure) Mother, Brother, Sister, Sister    Kidney Disease Sister    Leukemia Paternal Uncle    Stroke Paternal Grandmother    Thyroid Disease Sister          No current outpatient medications on file.       Review of Systems: Denies fevers or chills. All other systems reviewed and were found to be negative.    Physical Examination: No acute distress.  No respiratory distress no stridor.  No cyanosis of the upper extremities.  No epistaxis.  No otorrhea.  Moves all extremities.    Pertinent labs and clinical data reviewed:   Nutritional status:  . Body mass index is 34.3 kg/m.  Marland Kitchen Weight: 98.3 kg (216 lb 11.4 oz)  . Ideal body weight: 60.8 kg (134 lb 0.8 oz)  . Adjusted ideal body weight: 75.8 kg (167 lb 1.8 oz)  Recent Labs   Labs - Last 6 Months 02/15/22  1013   ALBUMIN 3.5    Hepatic function:  Recent Labs   Labs - Last 6 Months 02/15/22  1013   TOTBILIRUBIN 0.3   AST 16   ALT 22   ALKPHOS 64      CBC and Coag function:  No results for input(s): WBC, HGB, HCT, PLTCNT, APTT, INR in the last 4383 hours.  BMP and Renal function:  Recent Labs   Labs - Last 6 Months 02/15/22  1013   SODIUM 140   POTASSIUM 3.9   CHLORIDE 109   CALCIUM 8.9   BUN 14   CREATININE 1.03   GFR 61            Pre-Surgical H & P updated the day of the procedure.  1.  H&P the patient has been examined, and no change has occured in the patients condition since the H&P was completed.       Change in medications: No      Comments: None    2.  Patient continues to be appropiate candidate for planned surgical procedure. YES    Ponciano Ort, MD 02/17/2022 09:59

## 2022-02-17 NOTE — OR Surgeon (Signed)
[facility] OF OPHTHALMOLOGY - OPERATIVE NOTE    PATIENT NAME:  [name] NAME:  [id number]  DATE OF SERVICE:  02/17/2022  DATE OF BIRTH:  [birth year]    Pre-Operative Diagnosis: Left lower eyelid lesion  Post-Operative Diagnosis: Same    Procedure(s)/Description:    1. Excision of left lower lid lesion via transcutaneous approach, 1cm  2. Intralesional Kenalog injection    The patient was brought back to the operating room and a time out procedure was performed. The left lower lid was then infiltrated with 2% lidocaine with epinephrine 1:200,000. The patient was then prepped and draped in the usual sterile ophthalmic fashion.     LLL lesion excision:  A scleral shell was placed.  An #11 blade was used to incise over the center of the lesion. [place] scissors and 0.5 forceps were used to remove the lesion. Lesion was sent to pathology.   Hemostasis was maintained with bipolar cautery. Area was infiltrated with '5mg'$  Kenalog. Area was closed with 5-0 fast suture in simple running fashion. Antibiotic ointment was applied over the wounds.  The patient tolerated the procedure well, was awoken by general anesthesia uneventfully and was taken to the recovery room in stable condition    Attending Surgeon: [name], MD  Assistant(s): Ponciano Ort, MD; Andre Lefort DDS    Anesthesia Type: General  Estimated Blood Loss:  <5cc  Blood Given: None  Fluids Given: Per anesthesia  Complications: None  Characteristic Event: None  Did the use of long term anticoagulant impact the outcome of the case? No  Wound Class: Clean  Tubes: None  Drains: None  Specimens/ Cultures: Left lower lid lesion  Implants: None            Disposition: PACU, then home  Condition: Stable    Ponciano Ort, MD  02/17/2022, 12:00  I was present for all key and/or critical portions of the case and immediately available at all times.      [name], MD 02/17/2022, 12:23                [facility] NOTE    PATIENT NAME:  [name] NAME:  [id number]  DATE OF SERVICE:  02/17/2022  DATE OF BIRTH:  1956/01/16    Patient meets discharge criteria.  Discharge to home  Use eye medications as prescribed  RTC as scheduled or sooner prn any problem.     Ponciano Ort, MD  02/17/2022, 12:00

## 2022-02-19 DIAGNOSIS — H019 Unspecified inflammation of eyelid: Secondary | ICD-10-CM

## 2022-02-19 DIAGNOSIS — Z6834 Body mass index (BMI) 34.0-34.9, adult: Secondary | ICD-10-CM

## 2022-02-19 LAB — SURGICAL PATHOLOGY SPECIMEN

## 2022-03-03 ENCOUNTER — Encounter (INDEPENDENT_AMBULATORY_CARE_PROVIDER_SITE_OTHER): Payer: Self-pay | Admitting: Ophthalmology

## 2022-03-03 ENCOUNTER — Other Ambulatory Visit: Payer: Self-pay

## 2022-03-03 ENCOUNTER — Inpatient Hospital Stay (HOSPITAL_COMMUNITY)
Admission: RE | Admit: 2022-03-03 | Discharge: 2022-03-03 | Disposition: A | Discharge status: Home / Self Care | Payer: MEDICAID | Source: Ambulatory Visit

## 2022-03-03 ENCOUNTER — Ambulatory Visit: Payer: MEDICAID | Attending: Ophthalmology | Admitting: Ophthalmology

## 2022-03-03 DIAGNOSIS — H2513 Age-related nuclear cataract, bilateral: Secondary | ICD-10-CM

## 2022-03-03 DIAGNOSIS — H029 Unspecified disorder of eyelid: Secondary | ICD-10-CM

## 2022-03-03 NOTE — Progress Notes (Addendum)
[facility]  [place] [address]  Operated by [facility] Note    03/03/2022    Patient Name: [name]    Date of Birth:  December 04, 1955    Referring Provider:  No referring provider defined for this encounter.    Ophthalmologist/Optometrist:      Chief Complaint    Post-op Eye         HPI     Post-op Eye    In left eye.  Pain was noted as 0/10.  Treatments tried include antibiotic ointment. Additional comments: S/p Excision of left lower lid lesion via transcutaneous approach, 1cm  2. Intralesional Kenalog injection 02/17/22           Comments    1wk POV  Pt states she is doing well  Denies pain  Using ung as directed   VA stable          Last edited by Matilde Sprang, [place] on 03/03/2022  8:59 AM.          ROS    Positive for: Eyes (POV)  Negative for: Constitutional, Gastrointestinal, Neurological, Skin, Genitourinary, Musculoskeletal, HENT, Endocrine, Cardiovascular, Respiratory, Psychiatric, Allergic/Imm, Heme/Lymph  Last edited by Matilde Sprang, [place] on 03/03/2022  8:59 AM.          History of Sun Exposure:no If yes:   Excessive sun burn:   History radiation to head:   History of skin cancer:        Past Ocular history:   Past Surgical History:   Procedure Laterality Date   . CHALAZION EXCISION Left 12/22/2021    and conj lesion excision from caruncle   . COLONOSCOPY N/A 07/23/2019    Performed by Virgel Manifold, MD at [place]   . COLONOSCOPY N/A 01/06/2009    Performed by Tama High, MD at [place]   . COLONOSCOPY WITH BIOPSY N/A 07/23/2019    Performed by Virgel Manifold, MD at [place]   . COLONOSCOPY WITH POLYPECTOMY N/A 07/23/2019    Performed by Virgel Manifold, MD at [facility]   . CYSTOSCOPY N/A 09/24/2009    Performed by Domingo Sep, MD at [place]   . CYSTOSCOPY WITH CYSTOMETROGRAM AND URODYNAMICS N/A 05/01/2018    Performed by Thurmon Fair, MD at [place]   . EXCISION LESION  EYELID Left 02/17/2022    Performed by [name], MD at [place]   . EYELID RETRACTION REPAIR WITH EAR CARTILAGE GRAFT Right 08/10/2016    Performed by [name], MD at [place]   . EYELID RETRACTION REPAIR WITH EAR CARTILAGE GRAFT Bilateral 01/28/2015    Performed by [name], MD at [place]   . GASTROSCOPY Left 03/05/2010    Performed by Jocelyn Lamer, MD at [place]   . GASTROSCOPY Left 12/19/2007    Performed by Laurine Blazer, MD at [place]   . GASTROSCOPY WITH BIOPSY Left 03/05/2010    Performed by Jocelyn Lamer, MD at [place]   . HEMITHYROIDECTOMY Right 04/05/2019    Performed by Delfino Lovett, MD at [place]   . HX ADENOIDECTOMY     . HX ANKLE FRACTURE [place]  2007    left distal fibula, casted   . HX APPENDECTOMY     . HX  CHOLECYSTECTOMY     . HX COLONOSCOPY     . HX CYSTOCELE REPAIR  09/24/2009   . HX HAND SURGERY  2012    for Carpal tunnel : right side treated   . HX HEART CATHETERIZATION     . HX OOPHORECTOMY      left ovary removed   . HX PAROTIDECTOMY       "surgical clamp"    . HX PARTIAL THYROIDECTOMY Right 04/05/2019    R hemithyroidectomy for nodule, final path follicular adenoma   . HX TONSILLECTOMY     . HX TOTAL VAGINAL HYSTERECTOMY  1979   . HX WRIST FRACTURE [place]  2007    [place] injury   . INJECTION EXCHANGE GAS Right 11/02/2021    Performed by Con Memos, MD at [place] N/A 01/03/2008    Performed by Laurine Blazer, MD at [place]   . PAROTIDECTOMY SUPERFICIAL WITH FACIAL NERVE DISSECTION Right 02/06/2015    Performed by Delfino Lovett, MD at [place]   . PARS PLANA VITRECTOMY Right 11/02/2021    Pars plana vitrectomy, nternal limiting membrane (ILM) and epiretinal membrane (ERM) peel, Injection of 20%  sf6 gas, Subtenon's injection of Cefuroxime 50 mg and Dexamethasone 2 mg   . PB REVISE ULNAR NERVE AT ELBOW Left 1979   . PB UPPER GI ENDOSCOPY,BIOPSY  12/19/2007    patulous GE junction zone,  erythema, nonerosive GERD   . [place] PROBE N/A 01/03/2008    Performed by Laurine Blazer, MD at [place] [facility]   . RELEASE CARPAL TUNNEL Right 11/30/2010    Performed by Stoney Bang, MD at [place]   . REPAIR ANTERIOR AND POSTERIOR N/A 09/24/2009    Performed by Domingo Sep, MD at [place]   . REPAIR ECTROPION BILATERAL Bilateral 01/28/2015    Performed by [name], MD at [place]   . SUSPENSION VAGINAL VAULT N/A 09/24/2009    Performed by Domingo Sep, MD at [place]   . VAGINECTOMY PARTIAL N/A 09/24/2009    Performed by Domingo Sep, MD at [place]   . VITRECTOMY POSTERIOR WITH MEMBRANE PEEL Right 11/02/2021    Performed by Con Memos, [facility] at [facility] [place], [place] 03/03/2022, 11:56    MD Addition to HPI:    The patient is a 66 y.o. female here with complaint of:    POW#1, S/p LLL lesion excision and intralesional kenalog injection (02/17/22). Pt has been doing well since the procedure. Endorses stable VA. Denies new FOL/floaters, diplopia, HA, and pain.     Assessment     Impression(s):      ICD-10-CM    1. Lesion of left lower eyelid  H02.9 OPH EXTERNAL PHOTOS          Plan(s)/Recommendation(s):   S/p LLL lesion excision and intralesional kenalog injection (02/17/22)  - healing well  - pathology shows:  A: SKIN, LEFT EYELID:                  Chronic inflammation, fibrous connective tissue, and skeletal muscle.                  No evidence of malignancy.    - cont emycin qPM  - cont AT OU PRN  - rec WC  -  no restrictions - rec to not rub area  - trimmed sutures in clinic today  - RTC PRN    s/p LLL and conj lesion removal (12/22/21)  - healing well but persistent lesion seen  - cont AT OU PRN    [place] OU  - not visually significant  - cont to monitor and follow with Dr. Lucia Gaskins    Hx of macular hole ODs/p repair  -continue to follow with Dr. Darnell Level    Imaging (CT/MRI) Reviewed:   Results Reviewed:       I personally performed the services described  in this documentation, as scribed  in my presence, and it is both accurate  and complete.  [name], MD 03/03/2022  11:56    I am scribing for, and in the presence of, Dr. Alfonse Spruce for services provided on 03/03/2022.  [name], [place], [place]  03/03/2022, 09:49    I saw and examined the patient.  I reviewed the technician/resident's note.  I agree with the findings and plan of care as documented in the resident's note.  Any exceptions/additions are edited/noted.    [name], MD 03/03/2022  11:56      Eye Examination:     Neuro/Psych     Oriented x3: Yes    Mood/Affect: Normal        Visual Acuity     Visual Acuity (Snellen - Linear)       Right Left    Dist cc 20/200 20/40    Dist ph cc 20/60 -2 20/30 -1    Correction: Glasses          Edited by: Matilde Sprang, COA            Wearing Rx     Wearing Rx       Sphere Cylinder Axis Add    Right +2.25 +1.75 173 +2.50    Left +1.50 +0.50 013 +2.50          Edited by: Matilde Sprang, COA            Not recorded       Not recorded       Confrontational Visual Fields     Visual Fields       Right Left     Full Full          Edited by: [name], MD            Pupils       Pupils APD    Right PERRL None    Left PERRL None        Extraocular Movement     Extraocular Movement       Right Left     Full Full          Edited by: Matilde Sprang, COA                               Sensation   V1: Intact bilaterally   V2: Intact bilaterally   V3: Intact bilaterally    Facial Contours:     Hertel:                            OD:                            OS:  Base:    Eyelids:    Bell's:      Lid edema FK:CLEX OS: None Conj chem OD: None NT:ZGYF   Lid inject OD: None OS: None Conj inject OD: None OS: None             PF   MRD                       LF   LC                       USS   LSS                    LAG            Main Ophthalmology Exam     External Exam       Right Left    External Normal Normal          Slit Lamp Exam        Right Left    Lids/Lashes Normal , Ecchymosis    Conjunctiva/Sclera White and quiet White and quiet    Cornea Clear Clear    Anterior Chamber Deep and quiet Deep and quiet    Iris Round and reactive Round and reactive    Lens 2+ NS 1+ NS    Anterior Vitreous Normal Normal          Fundus Exam       Right Left    Disc Normal Normal    C/D Ratio 0.35 0.3    Macula s/p peel, attached Normal    Vessels Normal Normal    Periphery Normal Normal                IOP straight Tonometry     Tonometry    Deferred            Edited by: Matilde Sprang, [place]     5 down      Past Medical / Surgical / Family / Social History:      has a past medical history of Anxiety, Arthritis, Cancer (CMS [place]), Chronic pain, Depression, Dyspnea on exertion, Essential hypertension (02/16/2017), GERD (gastroesophageal reflux disease), Headache, Heart murmur (1979), Hyperlipidemia LDL goal < 100 (08/29/2013), MINOR CAD (coronary artery disease) (11/10/2012), Multinodular goiter, Muscle weakness, Obesity, Peripheral edema, Rash, Rheumatic fever, S/P thyroidectomy, Squamous cell carcinoma (74/94/4967), 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    She has no past medical history of Allergic rhinitis, Anemia, Anesthesia complication, Angina at rest (CMS [facility]), Angina pectoris (CMS [facility]), Aortic aneurysm (CMS [place]), Asthma, Asthma, Automatic implantable cardioverter-defibrillator in situ, Awareness under anesthesia, Back problem, BiPAP (biphasic positive airway pressure) dependence, Blood transfusion, without reported diagnosis, Breast CA (CMS HCC), Bruises easily, Cataract, Cervical cancer (CMS HCC), Chronic bronchitis (CMS HCC), Chronic obstructive airway disease (CMS HCC), Clotting disorder (CMS HCC), Cognitive dysfunction, Colon cancer (CMS [place]), Congenital anomaly of heart, Congestive heart failure, unspecified, COPD (chronic obstructive pulmonary disease) (CMS HCC), CPAP  (continuous positive airway pressure) dependence, CVA (cerebrovascular accident) (CMS [place]), Deep vein thrombosis (DVT) (CMS [place]), Diabetes, Diabetes mellitus (CMS [place]), Diabetes mellitus type II, Diabetes mellitus, type 1, Diabetes mellitus, type 2 (CMS [place]), Difficult intubation, Difficult intubation, Difficulty waking, Disorder of liver, Dysrhythmias, Endometrial cancer (CMS HCC), Glaucoma, H/O hearing loss, H/O urinary tract infection,  Hard to intubate, Hearing loss, Hepatic cirrhosis (CMS HCC), Hepatitis A virus infection, HTN, breast cancer, coronary artery bypass graft, transfusion, Hyperthyroidism, Hypothyroid, Kidney disease, Loss of weight, Malignant hyperthermia, MI (myocardial infarction) (CMS HCC), Myocardial infarction (CMS HCC), Nausea with vomiting, Neck problem, Other convulsions, Ovarian cancer (CMS [place]), Pacemaker, Palpitations, Panic attack, Personal history of irradiation, Personal history of malignant neoplasm of prostate, PONV (postoperative nausea and vomiting), PONV (postoperative nausea and vomiting), Problems with swallowing, Pseudocholinesterase deficiency, Pulmonary embolism (CMS HCC), Pulmonary emphysema (CMS HCC), Renal failure, Seizure (CMS HCC), Seizures, Shortness of breath, Sleep apnea, Spinal headache, STD (sexually transmitted disease), Stented coronary artery, Stroke (CMS HCC), Thyroid disorder, Treatment, Type 2 diabetes mellitus, Type 2 diabetes mellitus (CMS [place]), Type I diabetes mellitus (CMS [place]), Upper respiratory infection, Uterine cancer (CMS [place]), Valvular disease, Viral hepatitis B, or Viral hepatitis C.    Past Surgical History:   Procedure Laterality Date   . CHALAZION EXCISION Left 12/22/2021    and conj lesion excision from caruncle   . COLONOSCOPY  01/06/2009    COLONOSCOPY performed by Loletta Parish F at [place]   . GASTROSCOPY  03/05/2010    GASTROSCOPY performed by Jocelyn Lamer at [facility] [place]   . GASTROSCOPY WITH BIOPSY  03/05/2010    GASTROSCOPY WITH BIOPSY  performed by Jocelyn Lamer at [place]   . HX ADENOIDECTOMY     . HX ANKLE FRACTURE [place]  2007    left distal fibula, casted   . HX APPENDECTOMY     . HX CHOLECYSTECTOMY     . HX COLONOSCOPY     . HX CYSTOCELE REPAIR  09/24/2009   . HX HAND SURGERY  2012    for Carpal tunnel : right side treated   . HX HEART CATHETERIZATION     . HX OOPHORECTOMY      left ovary removed   . HX PAROTIDECTOMY       "surgical clamp"    . HX PARTIAL THYROIDECTOMY Right 04/05/2019    R hemithyroidectomy for nodule, final path follicular adenoma   . HX TONSILLECTOMY     . HX TOTAL VAGINAL HYSTERECTOMY  1979   . HX WRIST FRACTURE [place]  2007    [place] injury   . PARS PLANA VITRECTOMY Right 11/02/2021    Pars plana vitrectomy, nternal limiting membrane (ILM) and epiretinal membrane (ERM) peel, Injection of 20%  sf6 gas, Subtenon's injection of Cefuroxime 50 mg and Dexamethasone 2 mg   . PB REVISE ULNAR NERVE AT ELBOW Left 1979   . PB UPPER GI ENDOSCOPY,BIOPSY  12/19/2007    patulous GE junction zone, erythema, nonerosive GERD           Family Medical History:     Problem Relation (Age of Onset)    Bipolar Disorder Daughter, Paternal Uncle    Breast Cancer Paternal Aunt, Paternal Aunt, Paternal Aunt, Paternal Aunt, Paternal Aunt, Other    Cancer Paternal Aunt, Paternal 78, Paternal 36, Paternal 59, Paternal 76, Other (83)    Congestive Heart Failure Father (56)    Coronary Artery Disease Father, Mother (58)    Diabetes Mother    Heart Attack Father, Sister    High Cholesterol Mother    Hypertension (High Blood Pressure) Mother, Brother, Sister, Sister    Kidney Disease Sister    Leukemia Paternal Uncle    Stroke Paternal Grandmother    Thyroid Disease Sister            Social  History     Tobacco Use   . Smoking status: Former     Packs/day: 2.00     Years: 40.00     Pack years: 80.00     Types: Cigarettes     Quit date: 01/22/2005     Years since quitting: 17.1   . Smokeless tobacco: Never   Vaping Use   . Vaping Use: Never used   Substance  Use Topics   . Alcohol use: No     Alcohol/week: 0.0 standard drinks   . Drug use: No       I have seen and examined the above patient. I discussed the above diagnoses listed in the assessment and the above ophthalmic plan of care with the patient and patient's family. All questions were answered. I reviewed and, when necessary, made changes to the technician/resident note, documented ophthalmology exam, chief complaint, history of present illness, allergies, review of systems, past medical, past surgical, family and social history.    [name], MD 03/03/2022  11:56

## 2022-03-09 ENCOUNTER — Ambulatory Visit (INDEPENDENT_AMBULATORY_CARE_PROVIDER_SITE_OTHER): Payer: Self-pay | Admitting: Anesthesiology

## 2022-03-11 ENCOUNTER — Other Ambulatory Visit: Payer: Self-pay

## 2022-03-11 ENCOUNTER — Ambulatory Visit (INDEPENDENT_AMBULATORY_CARE_PROVIDER_SITE_OTHER): Payer: MEDICAID | Admitting: Student in an Organized Health Care Education/Training Program

## 2022-03-11 DIAGNOSIS — F331 Major depressive disorder, recurrent, moderate: Secondary | ICD-10-CM

## 2022-03-11 DIAGNOSIS — F411 Generalized anxiety disorder: Secondary | ICD-10-CM

## 2022-03-11 DIAGNOSIS — F332 Major depressive disorder, recurrent severe without psychotic features: Secondary | ICD-10-CM

## 2022-03-11 DIAGNOSIS — F4310 Post-traumatic stress disorder, unspecified: Secondary | ICD-10-CM

## 2022-03-11 MED ORDER — BUPROPION HCL XL 450 MG 24 HR TABLET, EXTENDED RELEASE
450.0000 mg | ORAL_TABLET | Freq: Every day | ORAL | 3 refills | Status: DC
Start: 2022-03-11 — End: 2022-07-14

## 2022-03-11 MED ORDER — MIRTAZAPINE 15 MG TABLET
15.0000 mg | ORAL_TABLET | Freq: Every evening | ORAL | 2 refills | Status: DC
Start: 2022-03-11 — End: 2022-05-27

## 2022-03-11 MED ORDER — FLUOXETINE 20 MG TABLET
ORAL_TABLET | ORAL | 2 refills | Status: DC
Start: 2022-03-11 — End: 2022-07-14
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
## 2022-04-22 NOTE — ED Provider Notes (Signed)
Attending Note    This is a 66 y.o. presenting to the ED with pain to the lateral aspect of her left foot.  The patient struck her foot with a shopping buggy when she pulled it out from a roll of buggies.  It hit her foot and since that time she has had moderate constant pain to the lateral aspect of her left foot.  Is made worse with weightbearing.  She denies any other trauma.  She has no numb cool toes or other complaints.        ROS:  As per HPI    Physical Exam:  Gen: Well appearing, NAD  Chest: CTA bilaterally, no w/r/r  Cardiac: nl S1S2 s m/g/r, normal pulses and perfusion to the left foot  Skin: no rashes, no ecchymoses, no abrasions or lacerations  Neuro: normal gait, speech, and balance, normal sensation to the left foot  MSK: Tenderness to the base of the fifth metatarsal.  There is no deformity to the foot into the toes.  No tenderness to the lateral malleolus.  Otherwise normal ankle with normal range of motion, normal knee with normal range of motion  Psych: normal mood, thought, and affect      Assessment/Plan:  66 year old with foot pain after hitting her foot.  X-ray, I reviewed the images myself, demonstrates lucency at the base of the fifth metatarsal.  Patient has a hard sole shoe/cast shoe at home.  She will use this, weightbearing as tolerated and follow-up with primary care.  She will return for new pain, worsening pain or other concerns.         Chauncey Reading, MD  04/22/22 [phone]

## 2022-04-26 ENCOUNTER — Ambulatory Visit (INDEPENDENT_AMBULATORY_CARE_PROVIDER_SITE_OTHER): Payer: Self-pay | Admitting: Ophthalmology

## 2022-04-28 ENCOUNTER — Ambulatory Visit (HOSPITAL_BASED_OUTPATIENT_CLINIC_OR_DEPARTMENT_OTHER): Payer: Self-pay | Admitting: Physician Assistant

## 2022-04-29 ENCOUNTER — Other Ambulatory Visit (INDEPENDENT_AMBULATORY_CARE_PROVIDER_SITE_OTHER): Payer: Self-pay | Admitting: NURSE PRACTITIONER, FAMILY

## 2022-04-29 DIAGNOSIS — J309 Allergic rhinitis, unspecified: Secondary | ICD-10-CM

## 2022-04-29 NOTE — Telephone Encounter (Signed)
LOV 04/30/21, pt will need to see provider for additional refills

## 2022-05-04 ENCOUNTER — Ambulatory Visit (INDEPENDENT_AMBULATORY_CARE_PROVIDER_SITE_OTHER): Payer: Self-pay | Admitting: Anesthesiology

## 2022-05-25 ENCOUNTER — Other Ambulatory Visit (INDEPENDENT_AMBULATORY_CARE_PROVIDER_SITE_OTHER): Payer: Self-pay | Admitting: NURSE PRACTITIONER, FAMILY

## 2022-05-25 DIAGNOSIS — J309 Allergic rhinitis, unspecified: Secondary | ICD-10-CM

## 2022-05-25 NOTE — Telephone Encounter (Signed)
LOV 04/30/2021, patient needs to see provider for additional refills

## 2022-05-27 ENCOUNTER — Ambulatory Visit: Payer: Medicare Other | Attending: Physician Assistant | Admitting: Physician Assistant

## 2022-05-27 ENCOUNTER — Other Ambulatory Visit: Payer: Self-pay

## 2022-05-27 ENCOUNTER — Inpatient Hospital Stay (HOSPITAL_BASED_OUTPATIENT_CLINIC_OR_DEPARTMENT_OTHER)
Admission: RE | Admit: 2022-05-27 | Discharge: 2022-05-27 | Disposition: A | Discharge status: Home / Self Care | Payer: Medicare Other | Source: Ambulatory Visit

## 2022-05-27 ENCOUNTER — Encounter (HOSPITAL_BASED_OUTPATIENT_CLINIC_OR_DEPARTMENT_OTHER): Payer: Self-pay | Admitting: Physician Assistant

## 2022-05-27 VITALS — BP 110/72 | HR 102 | Temp 97.1°F | Ht 66.65 in | Wt 211.4 lb

## 2022-05-27 DIAGNOSIS — S92309A Fracture of unspecified metatarsal bone(s), unspecified foot, initial encounter for closed fracture: Secondary | ICD-10-CM

## 2022-05-27 DIAGNOSIS — K635 Polyp of colon: Secondary | ICD-10-CM

## 2022-05-27 DIAGNOSIS — D369 Benign neoplasm, unspecified site: Secondary | ICD-10-CM | POA: Insufficient documentation

## 2022-05-27 NOTE — Progress Notes (Signed)
Department of Family Medicine   Progress Note    Anhar Mcdermott  MRN: [id number]  DOB: 06-08-1956  Date of Service: 05/27/2022    CHIEF COMPLAINT  Chief Complaint   Patient presents with    Foot And Ankle Injury       SUBJECTIVE  [name] is a 66 y.o. female who presents to clinic for concern of left foot pain.     States that she fractured it when she was in [place]. It was diagnosed in an ED and she was given a boot to wear for 4-6 weeks. Has been compliant with this. States she is still having pain in the lateral aspect of her foot. Also having some numbness. Denies swelling, bruising, and redness.     Also received a letter regarding recommendation for an endoscopy. Thought she was not due for another 7 years.       Review of Systems:  Positive ROS discussed in HPI, otherwise all other systems negative.      Medications:   Azelaic Acid 20 % Cream, Apply topically Twice daily  buPROPion (WELLBUTRIN XL) 450 mg extended release 24 hr tablet, Take 1 Tablet (450 mg total) by mouth Once a day  cetirizine (ZYRTEC) 10 mg Oral Tablet, Take 1 Tablet (10 mg total) by mouth Once a day  clobetasoL (TEMOVATE) 0.05 % Ointment, Apply topically Twice daily  cyclopentolate (CYCLOGYL) 1 % Ophthalmic Drops, FOR USE IN THE SURGICAL SUITE AND 3 TIMES A DAY INTO RIGHT EYE AS DIRECTED (Patient not taking: Reported on 05/27/2022)  diclofenac sodium (VOLTAREN) 1 % Gel, Apply topically Four times a day - before meals and bedtime  FLUoxetine 20 mg Oral Tablet, Take 3 tablets daily (60 mg total)  fluticasone propionate (FLONASE) 50 mcg/actuation Nasal Spray, Suspension, Administer 2 Sprays into each nostril Once a day  gabapentin (NEURONTIN) 600 mg Oral Tablet, Take 1 Tablet (600 mg total) by mouth Twice daily  Halobetasol Propionate (ULTRAVATE) 0.05 % Cream, Apply topically Twice per day as needed Apply  topically. Three times a week (Patient not taking: Reported on 05/27/2022)  loratadine (CLARITIN) 10 mg Oral Tablet,  Take 1 Tablet (10 mg total) by mouth Once a day  mineral oil-hydrophil petrolat (AQUAPHOR) Ointment, Apply topically Once per day as needed (Patient not taking: Reported on 05/27/2022)  mirtazapine (REMERON) 15 mg Oral Tablet, Take 1 Tablet (15 mg total) by mouth Every night (Patient not taking: Reported on 05/27/2022)  moxifloxacin (VIGAMOX) 0.5 % Ophthalmic Drops, FOR USE IN THE SURGICAL SUITE AND 4 TIMES A DAY INTO RIGHT EYE AS DIRECTED  neomycin-polymyxin-dexamethasone (MAXITROL) 3.5 mg/g-10,000 unit/g-0.1 % Ophthalmic Ointment, Apply to left eye Four times a day (Patient not taking: Reported on 05/27/2022)  nitroGLYCERIN (NITROSTAT) 0.4 mg Sublingual Tablet, Sublingual, 1 Tab (0.4 mg total) by Sublingual route Every 5 minutes as needed for Chest pain for up to 3 doses for 3 doses over 15 minutes  nystatin (NYSTOP) 100,000 unit/gram Powder, Apply once or twice daily to PREVENT rash under breasts (Patient not taking: Reported on 05/27/2022)  pantoprazole (PROTONIX) 40 mg Oral Tablet, Delayed Release (E.C.), Take 1 Tablet (40 mg total) by mouth Once a day  prednisoLONE acetate (PRED FORTE) 1 % Ophthalmic Drops, Suspension, FOR USE IN THE SURGICAL SUITE AND 4 TIMES A DAY INTO RIGHT EYE AS DIRECTED    No facility-administered medications prior to visit.      Allergies:   Allergies   Allergen Reactions    Cymbalta [Duloxetine]  Rash on feet and hands    Blue Dye      Told to avoid due to patch test result by [employer] [facility]  Other Adverse Reaction (Add comment)     Patient unsure of reaction    Lipitor [Atorvastatin] Myalgia     Muscle pain    Nickel      Blisters  "Allergic to all kinds of metal"     Codeine Itching     RASH    Mobic [Meloxicam] Nausea/ Vomiting     Nervous and shakes     Naprosyn [Naproxen] Nausea/ Vomiting    Nsaids (Non-Steroidal Anti-Inflammatory Drug) Nausea/ Vomiting         OBJECTIVE  BP 110/72   Pulse (!) 102   Temp 36.2 C (97.1 F) (Thermal Scan)   Ht 1.693 m (5' 6.65")   Wt  95.9 kg (211 lb 6.7 oz)   SpO2 97%   BMI 33.46 kg/m       General: no distress  HENT: TMs clear, mouth mucous membranes moist, pharynx without injection or exudate   Lungs: clear to auscultation bilaterally  Cardiovascular: RRR, no murmur  Extremities: no cyanosis or edema   L foot without erythema, ecchymosis, or edema   Skin: warm and dry, no rash  Neurologic: gait is normal, AOx3  Psychiatric: normal affect and behavior      ASSESSMENT/PLAN  (S92.309A) Metatarsal fracture  (primary encounter diagnosis)  Plan:  Acute concern   Will repeat imaging to r/o nonhealing fracture   XR FOOT LEFT  Will progress to regular footwear as tolerated pending fracture is healed          (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        Alvis Lemmings, PA-C 05/27/2022, 10:26

## 2022-06-01 ENCOUNTER — Inpatient Hospital Stay (HOSPITAL_BASED_OUTPATIENT_CLINIC_OR_DEPARTMENT_OTHER)
Admission: RE | Admit: 2022-06-01 | Discharge: 2022-06-01 | Disposition: A | Discharge status: Home / Self Care | Payer: Medicare Other | Source: Ambulatory Visit

## 2022-06-01 ENCOUNTER — Other Ambulatory Visit: Payer: Self-pay

## 2022-06-01 ENCOUNTER — Encounter (INDEPENDENT_AMBULATORY_CARE_PROVIDER_SITE_OTHER): Payer: Self-pay | Admitting: Anesthesiology

## 2022-06-01 ENCOUNTER — Ambulatory Visit: Payer: Medicare Other | Attending: Anesthesiology | Admitting: Anesthesiology

## 2022-06-01 VITALS — BP 132/86 | HR 87 | Temp 97.5°F | Ht 65.0 in | Wt 214.9 lb

## 2022-06-01 DIAGNOSIS — M4722 Other spondylosis with radiculopathy, cervical region: Secondary | ICD-10-CM

## 2022-06-01 DIAGNOSIS — M47812 Spondylosis without myelopathy or radiculopathy, cervical region: Secondary | ICD-10-CM | POA: Insufficient documentation

## 2022-06-01 DIAGNOSIS — M542 Cervicalgia: Secondary | ICD-10-CM

## 2022-06-01 DIAGNOSIS — M4802 Spinal stenosis, cervical region: Secondary | ICD-10-CM | POA: Insufficient documentation

## 2022-06-01 DIAGNOSIS — M5412 Radiculopathy, cervical region: Secondary | ICD-10-CM

## 2022-06-01 DIAGNOSIS — S92302A Fracture of unspecified metatarsal bone(s), left foot, initial encounter for closed fracture: Secondary | ICD-10-CM

## 2022-06-01 NOTE — Progress Notes (Signed)
[facility]  Return Outpatient Note    Date:  06/01/2022  Age:  66 y.o.  Referring Physician:   Alvis Lemmings, PA-C  [name]  [name],  [place] [id number]    Follow Up:    Subjective: some previously noted  [name] is a 66 year old female returning to clinic for 1 year follow up after C6 SNRB.   She was last seen 07/21/2021.     "Lumbar MRI had shown no significant pathology to explain her right hip/leg pain. She had a positive Patrick's test and was tender over the trochanter. She underwent a right hip differential on 03/06/21: excellent relief x 1 week. She is allergic to Mobic. She saw podiatry: foot symptoms better with metatarsal shoe lift.      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 A C6 SNRB had been ordered but denied."      She reports good results from injection. She does have neck pain in am described as sharp with radiation to left scapula with numbness and  tingling. Right arm symptoms resolved.   She reports constant lumbar pain described as sharp, worse with prolonged walking. Leaning over shopping cart helps. No leg pain/ numbness. Reports chronic overactive bladder.     Today, the patient is doing worse. Pain is rated as a 8/10 described above. Patient denies fevers, nausea, vomiting, diarrhea, SOB and bowel or bladder dysfunction. No reported falls, seizures, blurry vision or headaches. She reports healing from a left foot fracture. She has no recent cervical imaging.  She is requesting another injection.     Objective:   Vital Signs:  BP 132/86   Pulse 87   Temp 36.4 C (97.5 F)   Ht 1.651 m ('5\' 5"'$ )   Wt 97.5 kg (214 lb 15.2 oz)   BMI 35.77 kg/m     Examination  Constitutional:Well groomed, in no apparent distress  Skin:  Warm, dry, good color.   Head:  Normocephalic/ Atraumatic.   Eyes: Conjunctiva clear, + red reflex, optic discs and posterior segments - difficult to visualize, no  obvious abnormalities;   ENT:  Tongue midline, trachea midline   Cardiovascular:    Peripheral vascular system: Pulses palpable, cap refill intact  Respiratory:  Unlabored  Musculoskeletal  Gait and Station: slow and steady with no ataxia, antalgic  Strength: UTA left foot, in walking boot, hx fracture.   Arm Right Left Leg Right Left   Deltoid (shoulder abduction) 5/5 5/5 Hip Flexion 5/5 5/5   Biceps (Elbow flexion) 5/5 5/5 Knee Flexion 5/5 5/5   Triceps (Elbow extension) 5/5 5/5 Knee Extension 5/5 5/5   Grip 5/5 5/5 Foot Dorsiflexion 5/5 UTA      Foot Plantarflexion 5/5 UTA   Muscle tone (upper extremities): WNL  Muscle tone (lower extremities): WNL  Sensory: intact to light touch  DTR's:    Brachio-  radialis Biceps Triceps Patellar Achilles  Hoffman's   Right 1+ 1+ 1+ 1+ 1+  Not present   Left 1+ 1+ 1+ 1+ 1+  Not present   Ankle clonus: Negative  Coordination: Normal rapid alternating movements and finger to nose intact. No pronator drift.   Straight leg raise: Negative   Musculoskeletal tenderness: negative  Spurling negative-  Lhermitte negative-  Neurological  Level of consciousness: Alert and oriented  Recent and remote memory: Good recall and able to follow commands  Attention span and concentration: Normal in  conversation  Language/Speech: No aphasia or dysarthria  Fund of knowledge: Appropriate in this setting  Cranial Nerves  2-12 appears intact  Data reviewed  No recent cervical imaging.     Discussions with other providers:   Prior notes and charts reviewed.    Assessment:      ICD-10-CM    1. Cervical spondylosis  M47.812 External Referral to Physical Therapy for Orthopaedics Spine Patients     XR CERVICAL SPINE AP AND LAT W FLEX EXT     MRI SPINE CERVICAL WO CONTRAST     XR SPINE SCOLIOSIS PA AND LAT      2. Cervical radiculopathy  M54.12 External Referral to Physical Therapy for Orthopaedics Spine Patients     XR CERVICAL SPINE AP AND LAT W FLEX EXT     MRI SPINE CERVICAL WO CONTRAST     XR SPINE  SCOLIOSIS PA AND LAT      3. Neck pain  M54.2 External Referral to Physical Therapy for Orthopaedics Spine Patients     XR CERVICAL SPINE AP AND LAT W FLEX EXT     MRI SPINE CERVICAL WO CONTRAST     XR SPINE SCOLIOSIS PA AND LAT      4. Cervical spinal stenosis  M48.02 External Referral to Physical Therapy for Orthopaedics Spine Patients     XR CERVICAL SPINE AP AND LAT W FLEX EXT     MRI SPINE CERVICAL WO CONTRAST     XR SPINE SCOLIOSIS PA AND LAT          Recommendations    Pt's history and films from today's visit were reviewed. Plan was explained and discussed during the visit. The pt was given the opportunity to ask questions and they were addressed. [name] is in agreement with the following plan:  no recent imaging or conservative management.    -A physical therapy prescription was provided to the patient today. XR ordered. MRI cervical wo ordered to be obtained after attempting PT.     - RTC in 6 weeks after above has been completed.   - Continue Medical Management (Diet, Exercise, Medication).      - The patient has been advised to follow up with their PCP in regards any chronic medical conditions and any non-neurosurgical symptoms that they may have. A copy of the note from today's clinic appointment will be sent to the patient's PCP Alvis Lemmings, PA-C on file (confirmed during visit)      Alysia Penna, APRN,FNP-BC 06/01/2022, 10:44  Seen independently with Dr. Margart Sickles available as needed.

## 2022-06-02 DIAGNOSIS — M50822 Other cervical disc disorders at C5-C6 level: Secondary | ICD-10-CM

## 2022-06-02 DIAGNOSIS — M50823 Other cervical disc disorders at C6-C7 level: Secondary | ICD-10-CM

## 2022-06-02 DIAGNOSIS — M47812 Spondylosis without myelopathy or radiculopathy, cervical region: Secondary | ICD-10-CM

## 2022-06-02 DIAGNOSIS — M2578 Osteophyte, vertebrae: Secondary | ICD-10-CM

## 2022-06-02 DIAGNOSIS — M4312 Spondylolisthesis, cervical region: Secondary | ICD-10-CM

## 2022-06-06 ENCOUNTER — Other Ambulatory Visit (HOSPITAL_BASED_OUTPATIENT_CLINIC_OR_DEPARTMENT_OTHER): Payer: Self-pay | Admitting: Physician Assistant

## 2022-06-07 ENCOUNTER — Ambulatory Visit (INDEPENDENT_AMBULATORY_CARE_PROVIDER_SITE_OTHER): Payer: Self-pay | Admitting: Anesthesiology

## 2022-06-07 NOTE — Telephone Encounter (Addendum)
These restrictions are not listed in PT order. Routing to ordering provider to advise.   Shelda Pal, RN, CCRN 06/07/2022 13:31      Regarding: Margart Sickles  ----- Message from [place] sent at 06/07/2022 12:55 PM EDT -----  Rachelle Hora from [facility] is calling he is asking to verify that [name] stated in her eval today she is not to lift more than 5 pounds and not to lift arm over her head he just want to clarify this before starting her treatment please call to advise [phone] this is his personal cell

## 2022-06-09 NOTE — Telephone Encounter (Signed)
Alysia Penna, APRN,FNP-BC  Barbara Mad, RN    Dr. Margart Sickles left me with no specific restrictions. I would like her to maximize PT. If she is unable to tolerate performing theses exercises, we can reevaluate at follow up appt. I would like her to perform the full course of PT with therapist recommended exercises.    Effie Shy APRN    -------------------------------------------------------------------------------------------------------------------------    Informed physical therapist of provider's note. [name] verbalized understanding, and had no further questions at this time.  Shelda Pal, RN, CCRN 06/09/2022 13:54

## 2022-06-14 ENCOUNTER — Ambulatory Visit (INDEPENDENT_AMBULATORY_CARE_PROVIDER_SITE_OTHER): Payer: Self-pay | Admitting: Anesthesiology

## 2022-06-14 ENCOUNTER — Encounter (HOSPITAL_BASED_OUTPATIENT_CLINIC_OR_DEPARTMENT_OTHER): Payer: Self-pay | Admitting: Physician Assistant

## 2022-06-14 NOTE — Telephone Encounter (Addendum)
Called patient.  Dr. Margart Sickles has appt open tomorrow at 9:15am; patient states she will take appt.  Della Goo, RN    Regarding: Margart Sickles  ----- Message from [place] sent at 06/14/2022  3:19 PM EDT -----  Physical Therapy that was order by Melodie Bouillon  at the last appt with Dr Margart Sickles     Pt has done 3 appt for PT and Pt stated that the numbness has started on the right side of her face from the tim she started PT    PT is tell pt that she needs to see the dr before they can do PT again     Please reach out to Pt       Next appt is 07/27/22 with MRI

## 2022-06-15 ENCOUNTER — Encounter (INDEPENDENT_AMBULATORY_CARE_PROVIDER_SITE_OTHER): Payer: Self-pay | Admitting: Anesthesiology

## 2022-06-15 ENCOUNTER — Ambulatory Visit: Payer: Medicare Other | Attending: Anesthesiology | Admitting: Anesthesiology

## 2022-06-15 ENCOUNTER — Other Ambulatory Visit: Payer: Self-pay

## 2022-06-15 VITALS — BP 140/80 | HR 80 | Temp 96.4°F | Ht 65.0 in | Wt 215.4 lb

## 2022-06-15 DIAGNOSIS — R29810 Facial weakness: Secondary | ICD-10-CM

## 2022-06-15 DIAGNOSIS — R220 Localized swelling, mass and lump, head: Secondary | ICD-10-CM | POA: Insufficient documentation

## 2022-06-15 DIAGNOSIS — R200 Anesthesia of skin: Secondary | ICD-10-CM | POA: Insufficient documentation

## 2022-06-15 DIAGNOSIS — H938X9 Other specified disorders of ear, unspecified ear: Secondary | ICD-10-CM | POA: Insufficient documentation

## 2022-06-15 NOTE — Progress Notes (Signed)
[facility]  Return Outpatient Note    Date:  06/15/2022  Age:  66 y.o.  Referring Physician:   No referring provider defined for this encounter.    CERVICAL SPINE F/U    Subjective:   66 year old right handed female who was last seen on 06/01/22. No significant lumbar spine pathology to explain her symptoms. 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 Had received significant improvement from a C6 SNRB in the past. She was given a PT rx and was advised to f/u with a new cervical MRI. The MRI is scheduled on 07/27/22. During her 3rd PT session, she had acute onset of right facial burning and tingling and swelling with some drooping that started during ROM neck exercises (2 days ago). Bilateral hand tremor started a few days ago. Ear pressures/fullness. She continues to have 8/10 pain; right arm swelling. No h/o TIA. No blood thinners.     Objective:   Vital Signs:  BP (!) 140/80   Pulse 80   Temp (!) 35.8 C (96.4 F)   Ht 1.651 m ('5\' 5"'$ )   Wt 97.7 kg (215 lb 6.2 oz)   BMI 35.84 kg/m       Constitutional  General appearance: Normal  Cardiovascular:    Peripheral vascular system: Normal  Musculoskeletal  Gait and Station: : Normal  Muscle strength (upper extremities): : Normal  Muscle strength (lower extremities): : Normal  Muscle tone (upper extremities): : Normal  Muscle tone (lower extremities): : Normal  Sensation: Normal  Deep tendon reflexes upper and lower extremities: Normal   Coordination: Normal; tremor in bilateral hands  Tender posterior neck   Neurological  Orientation: Normal  Recent and remote memory: Normal  Attention span and concentration: Normal  Language: Normal  Fund of knowledge: Normal    Data reviewed  Cervical spine xrays 06/01/22 (films/report reviewed): 2 mm anterolisthesis C4 on C5    Assessment:      ICD-10-CM    1. Facial swelling  R22.0 Refer to [facility]     MRI BRAIN W/WO CONTRAST      2. Ear fullness   H93.8X9 Refer to [facility]     MRI BRAIN W/WO CONTRAST      3. Facial numbness  R20.0 MRI BRAIN W/WO CONTRAST          Recommendations  -Referral to ENT for ear symptoms.  -Will order MRI brain w/wo contrast (will try to move cervical MRI up sooner than 10/3 to do both on the same day).  -F/u after MRIs for further recommendations, sooner if problems develop.       -A copy of the note from today's clinic appointment will be sent via fax or mail to the patient's PCP and/or referring physician on file.       Sharman Crate, PA-C 06/15/2022, 11:00    The patient was seen as a shared visit with the co-signing faculty.

## 2022-06-15 NOTE — Progress Notes (Signed)
I personally saw and evaluated the patient. See mid-level's note for additional details. My findings/participation are : Pt is awaiting a cervical MRI. Has ET and during PT experienced a sudden onset of facial burning pain and watering in her eye with ear fullness and pressure. Also noted right facial droop. Will get a MRI brain and and evaluation with our ENT physicians. FU after MRI scans.    Debbora Dus, MD

## 2022-06-16 ENCOUNTER — Inpatient Hospital Stay
Admission: RE | Admit: 2022-06-16 | Discharge: 2022-06-16 | Disposition: A | Discharge status: Home / Self Care | Payer: Medicare Other | Source: Ambulatory Visit | Attending: PHYSICIAN ASSISTANT | Admitting: PHYSICIAN ASSISTANT

## 2022-06-16 DIAGNOSIS — I6782 Cerebral ischemia: Secondary | ICD-10-CM | POA: Insufficient documentation

## 2022-06-16 DIAGNOSIS — H938X9 Other specified disorders of ear, unspecified ear: Secondary | ICD-10-CM | POA: Insufficient documentation

## 2022-06-16 DIAGNOSIS — R200 Anesthesia of skin: Secondary | ICD-10-CM | POA: Insufficient documentation

## 2022-06-16 DIAGNOSIS — R220 Localized swelling, mass and lump, head: Secondary | ICD-10-CM | POA: Insufficient documentation

## 2022-06-16 MED ORDER — GADOBUTROL 1 MMOL/ML (604.72 MG/ML) INTRAVENOUS SOLUTION
9.5000 mL | INTRAVENOUS | Status: AC
Start: 2022-06-16 — End: 2022-06-16
  Administered 2022-06-16: 9.5 mL via INTRAVENOUS

## 2022-06-17 ENCOUNTER — Ambulatory Visit: Payer: Medicare Other | Attending: Geriatric Medicine | Admitting: Geriatric Medicine

## 2022-06-17 ENCOUNTER — Other Ambulatory Visit: Payer: Self-pay

## 2022-06-17 VITALS — BP 101/62 | HR 93 | Temp 97.2°F | Resp 18 | Ht 65.0 in | Wt 213.2 lb

## 2022-06-17 DIAGNOSIS — M62838 Other muscle spasm: Secondary | ICD-10-CM | POA: Insufficient documentation

## 2022-06-17 DIAGNOSIS — R221 Localized swelling, mass and lump, neck: Secondary | ICD-10-CM | POA: Insufficient documentation

## 2022-06-17 DIAGNOSIS — R251 Tremor, unspecified: Secondary | ICD-10-CM | POA: Insufficient documentation

## 2022-06-17 MED ORDER — BACLOFEN 5 MG TABLET
5.0000 mg | ORAL_TABLET | Freq: Three times a day (TID) | ORAL | 0 refills | Status: DC | PRN
Start: 2022-06-17 — End: 2022-09-14

## 2022-06-17 NOTE — Progress Notes (Signed)
Family Medicine, [name]  [place] [id number]  [phone]     Clinical Progress Note        Shaquela Weichert  Date of Service: 06/17/2022    Chief complaint:   Chief Complaint   Patient presents with    Facial Swelling     Neck and face swollen for a week. Right side worse then left. Place behind right ear, spine doctor thinks that is the problem. Had MRI 06-16-22. No new products.       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     She had acute onset of right sided neck swelling along with facial burning and tingling that started during ROM neck exercises with PT. She is also having bilateral hand tremor that started a few days before. Having ++ ear pressure and fullness on Rt side. She continues to have severe pain in her neck with restricted ROM.  Was seen by neurosurgeon, had MRI brain ordered that she had done yesterday. Waiting on report.   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 Was doing well up until recently.     REVIEW OF SYSTEMS:  Positive Review of systems discussed in HPI. Otherwise all other ROS was negative    Medications:  Outpatient Medications Marked as Taking for the 06/17/22 encounter (Office Visit) with Ann Maki, MD   Medication Sig    baclofen (LIORESAL) 5 mg Oral Tablet Take 1 Tablet (5 mg total) by mouth Three times a day as needed          Allergies:  Allergies   Allergen Reactions    Cymbalta [Duloxetine]      Rash on feet and hands    Blue Dye      Told to avoid due to patch test result by [place]  Other Adverse Reaction (Add comment)     Patient unsure of reaction    Lipitor [Atorvastatin] Myalgia     Muscle pain    Nickel      Blisters  "Allergic to all kinds of metal"     Codeine Itching     RASH    Mobic [Meloxicam] Nausea/ Vomiting     Nervous and shakes     Naprosyn  [Naproxen] Nausea/ Vomiting    Nsaids (Non-Steroidal Anti-Inflammatory Drug) Nausea/ Vomiting       Medical History:  Past Medical History:   Diagnosis Date    Anxiety     Arthritis     Cancer ([facility] [place])     internal skin cancer    Chronic pain     lower back    Depression     Dyspnea on exertion     Essential hypertension 02/16/2017    patient denies    GERD (gastroesophageal reflux disease)     controlled w/med    Headache     Heart murmur 1979    benign    Hyperlipidemia LDL goal < 100 08/29/2013    denies    MINOR CAD (coronary artery disease) 11/10/2012    no treatment other than cholesterol medications; follows regularly with K.Dalton, PA    Multinodular goiter     Muscle weakness     neck     Obesity     Peripheral edema  Rash     face only eczema    Rheumatic fever     in 8592 no complications    S/P thyroidectomy     Squamous cell carcinoma 02/06/2015    right side of neck    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 rate 18, height 1.651 m ('5\' 5"'$ ), weight 96.7 kg (213 lb 3 oz), SpO2 93 %, not currently breastfeeding.    General: appears to be in +ve distress  Eyes: Conjunctiva clear, Pupils equal and round, Pain with lateral ROM   HENT: Ear: clear canal, TM: mild congestin on Rt, throat: no erythema or swelling  Neck:  swelling of  Rt Sternocleidomastoid muscle, contracted, no separate palpable mass  No palpable LAD  Lungs: clear to auscultation bilaterally, no crackles, no wheezes  Cardiovascular: RRR, NS1S2, no murmur, gallop or rub   Extremities: no cyanosis or edema, PPP, ROM: grossly N  Neurologic: alert and oriented x3, ++ hand tremor noted bilaterally  Psychiatric: anxious mood and speech.    Labs:  Last CBC  (Last result in the past 2 years)        WBC   HGB   HCT   MCV   Platelets       07/14/21 1112 6.3   13.8   42.9   89.6   405             Last BMP  (Last result in the past 2 years)        Na   K   Cl   CO2   BUN   Cr   Calcium   Glucose   Glucose-Fasting        02/15/22 1013 140   3.9   109   23   14   1.03   8.9   104                  Assessment/Plan:    Nyelah was seen today for facial swelling.    Diagnoses and all orders for this visit:    Neck muscle spasm  -     baclofen (LIORESAL) 5 mg Oral Tablet; Take 1 Tablet (5 mg total) by mouth Three times a day as needed   Advised to use heat, ibuprofen as needed     Neck mass  -     US SOFT TISSUE NECK; Future    Tremor   Brain MRI was unremarkable    A/W c-spine MRI   F/U with neurology.      Assessment/Plan   1. Neck muscle spasm    2. Neck mass    3. Tremor

## 2022-06-19 ENCOUNTER — Inpatient Hospital Stay
Admission: RE | Admit: 2022-06-19 | Discharge: 2022-06-19 | Disposition: A | Discharge status: Home / Self Care | Payer: Medicare Other | Source: Ambulatory Visit | Attending: Nurse Practitioner | Admitting: Nurse Practitioner

## 2022-06-19 ENCOUNTER — Other Ambulatory Visit: Payer: Self-pay

## 2022-06-19 DIAGNOSIS — M47812 Spondylosis without myelopathy or radiculopathy, cervical region: Secondary | ICD-10-CM

## 2022-06-19 DIAGNOSIS — M542 Cervicalgia: Secondary | ICD-10-CM | POA: Insufficient documentation

## 2022-06-19 DIAGNOSIS — M5412 Radiculopathy, cervical region: Secondary | ICD-10-CM | POA: Insufficient documentation

## 2022-06-19 DIAGNOSIS — M4802 Spinal stenosis, cervical region: Secondary | ICD-10-CM

## 2022-06-21 ENCOUNTER — Ambulatory Visit (INDEPENDENT_AMBULATORY_CARE_PROVIDER_SITE_OTHER): Payer: Self-pay | Admitting: Anesthesiology

## 2022-06-21 NOTE — Telephone Encounter (Addendum)
Pt would need an appointment to discuss imaging. Attempted to reach pt to offer 9:30 am opening with Dr. Margart Sickles tomorrow. Unable to leave voicemail at number provided. If pt returns call can offer sooner appointment to discuss imaging.  Shelda Pal, RN, CCRN 06/21/2022 09:51      Regarding: Margart Sickles  ----- Message from Mayme Genta sent at 06/21/2022  9:09 AM EDT -----  Margart Sickles - pt had her MRI done and was told by MRI to call us and have you read them.  I don't have anything to move her up appt wise.      Thanks,  Butch Penny

## 2022-06-23 ENCOUNTER — Inpatient Hospital Stay (HOSPITAL_BASED_OUTPATIENT_CLINIC_OR_DEPARTMENT_OTHER)
Admission: RE | Admit: 2022-06-23 | Discharge: 2022-06-23 | Disposition: A | Discharge status: Home / Self Care | Payer: Medicare Other | Source: Ambulatory Visit

## 2022-06-23 ENCOUNTER — Ambulatory Visit: Payer: Medicare Other | Attending: Ophthalmology | Admitting: Ophthalmology

## 2022-06-23 ENCOUNTER — Other Ambulatory Visit: Payer: Self-pay

## 2022-06-23 ENCOUNTER — Encounter (INDEPENDENT_AMBULATORY_CARE_PROVIDER_SITE_OTHER): Payer: Self-pay | Admitting: Ophthalmology

## 2022-06-23 DIAGNOSIS — H25813 Combined forms of age-related cataract, bilateral: Secondary | ICD-10-CM

## 2022-06-23 NOTE — Addendum Note (Signed)
Addended by: Lind Guest on: 06/23/2022 04:39 PM     Modules accepted: Orders

## 2022-06-23 NOTE — Progress Notes (Addendum)
[facility]  [place] [id number]  Operated by [facility]         Patient Name: [name]  MRN#: [id number]  Dyer: 1955-11-08    Date of Service: 06/23/2022    Chief Complaint    Cataract         [name] Sheetal Lyall is a 66 y.o. female who presents today for evaluation/consultation of:  HPI       Cataract    In both eyes.  Associated symptoms include blurred vision.  Severity is moderate.  Onset was unknown.             Comments    Pt is here for cataract eval   Pt states she has dull achiness in OU constantly   States that [place] is stable , blurry OU   Pt states she noticed the blurriness after having surgery with Dr. Gala Murdoch in January 2023  Denies new/abnormal FOL  Pt states she is seeing floaters in OD   Denies missing/distorted VA  Pt is currently using gtts: artifical tears PRN              Last edited by Frederica Kuster, COA on 06/23/2022  3:25 PM.        ROS    Positive for: Eyes (cataract eval)  Negative for: Constitutional, Gastrointestinal, Neurological, Skin, Genitourinary, Musculoskeletal, HENT, Endocrine, Cardiovascular, Respiratory, Psychiatric, Allergic/Imm, Heme/Lymph  Last edited by Frederica Kuster, COA on 06/23/2022  3:25 PM.         All other systems Negative    Frederica Kuster, COA  06/23/2022, 15:31  Base Eye Exam       Visual Acuity (Snellen - Linear)         Right Left    Dist cc 20/200 20/50    Dist ph cc 20/70 20/40      Correction: Glasses              Tonometry (Tonopen, 3:51 PM)         Right Left    Pressure 11 11              Pupils         Shape React APD    Right Round Brisk None    Left Round Brisk None              Visual Fields (Counting fingers)         Right Left     Full Full              Extraocular Movement         Right Left     Full Full              Neuro/Psych       Oriented x3: Yes    Mood/Affect: Normal              Dilation       Both eyes: 0.5% Proparacaine, 1.0% Mydriacyl, 2.5% Phenylephrine @ 3:51 PM                   Additional Tests       Glare Testing (Transilluminator)         High    Right 20/60    Left 20/25                  Slit Lamp and Fundus Exam       External  Exam         Right Left    External Normal Normal              Slit Lamp Exam         Right Left    Lids/Lashes Normal , Ecchymosis    Conjunctiva/Sclera White and quiet White and quiet    Cornea Clear Clear    Anterior Chamber Deep and quiet Deep and quiet    Iris Round and reactive Round and reactive    Lens 2+ NS 1+ NS    Anterior Vitreous Normal Normal              Fundus Exam         Right Left    Disc Normal Normal    C/D Ratio 0.35 0.3    Macula s/p peel, attached Normal    Vessels Normal Normal    Periphery Normal Normal                  Refraction       Wearing Rx         Sphere Cylinder Axis Add    Right +2.25 +1.75 173 +2.50    Left +1.50 +0.50 013 +2.50              Manifest Refraction         Sphere Cylinder Axis Dist VA Add Near [place]    Right +1.00 +2.00 008 20/60 +2.50 2030    Left +0.75 +2.00 170 20/20-2 +2.50 20/30   I can refract pt to read 20/25 both eyes together but 20/20 looks too small  Pt can read 20/60 OD and 20/20 -1 OS with new refraction Frederica Kuster, COA 06/23/2022 15:42                       MD Addition to HPI:  Visual complaints, difficulty with reading, driving, performing daily activities        ICD-10-CM    1. Combined form of age-related cataract, both eyes  H25.813         Assessment: Visual significant cataract right eye.  CPT: [place] with IOL  [id number]  The patient's impairment of visual function is believed not to be correctable with a tolerable change in glasses or contact lenses.     Cataract (in the operative eye) is believed to be significantly contributing to the patient's visual impairment.      Risks and benefits of cataract surgery reviewed with the patient including the risk of vision loss from infection or intraocular bleeding.  The increased risk of retinal detachment following cataract surgery was discussed.  Patient  understands the risks and benefits and is agreeable to proceed.     There is a reasonable expectation that lens surgery will significantly improve both the visual and functional status of the patient.  The patient desires surgical correction.      Plan: Risks, benefits, and nature of cataract extraction with lens implant reviewed and patient consents to surgery. Plan topical anesthesia with Monitored Anesthesia Care (MAC). Patient to see surgery coordinator today to schedule surgery.     Anticipated lens implant: clareon, not an ATIOL candidate  Special needs:  none  Co-management: No  OCT: Not done    Gwendalyn Ege, MD  06/23/2022, 16:08    I have seen and examined the above patient. I discussed the above diagnoses listed in the assessment and the above ophthalmic plan of care  with the patient and patient's family. All questions were answered. I reviewed and, when necessary, made changes to the technician/resident note, documented ophthalmology exam, chief complaint, history of present illness, allergies, review of systems, past medical, past surgical, family and social history. I personally reviewed and interpreted all testing and/or imaging performed at this visit and agree with the resident's or fellow's interpretation. Any exceptions/additions are edited/noted in the relevant encounter fields.  Gwendalyn Ege, MD  06/23/2022, 16:08

## 2022-06-25 ENCOUNTER — Inpatient Hospital Stay
Admission: RE | Admit: 2022-06-25 | Discharge: 2022-06-25 | Disposition: A | Discharge status: Home / Self Care | Payer: Medicare Other | Source: Ambulatory Visit | Attending: Geriatric Medicine | Admitting: Geriatric Medicine

## 2022-06-25 ENCOUNTER — Other Ambulatory Visit: Payer: Self-pay

## 2022-06-25 DIAGNOSIS — R221 Localized swelling, mass and lump, neck: Secondary | ICD-10-CM

## 2022-06-29 ENCOUNTER — Other Ambulatory Visit (INDEPENDENT_AMBULATORY_CARE_PROVIDER_SITE_OTHER): Payer: Self-pay | Admitting: Family

## 2022-06-29 ENCOUNTER — Encounter (INDEPENDENT_AMBULATORY_CARE_PROVIDER_SITE_OTHER): Payer: Self-pay | Admitting: Gastroenterology

## 2022-06-29 DIAGNOSIS — Z1211 Encounter for screening for malignant neoplasm of colon: Secondary | ICD-10-CM

## 2022-06-29 MED ORDER — PEG 3350-ELECTROLYTES 236 GRAM-22.74 GRAM-6.74 GRAM-5.86 GRAM SOLUTION
4.0000 L | Freq: Once | ORAL | 0 refills | Status: AC
Start: 2022-06-29 — End: 2022-06-29

## 2022-06-30 ENCOUNTER — Encounter (INDEPENDENT_AMBULATORY_CARE_PROVIDER_SITE_OTHER): Payer: Self-pay | Admitting: Ophthalmology

## 2022-06-30 NOTE — Progress Notes (Signed)
Called pt to schedule surgery.  Unable to leave a voicemail.   Arnetia Bronk A. Helene Kelp, [place] 06/30/2022 15:12

## 2022-07-07 ENCOUNTER — Other Ambulatory Visit (INDEPENDENT_AMBULATORY_CARE_PROVIDER_SITE_OTHER): Payer: Self-pay | Admitting: NURSE PRACTITIONER, FAMILY

## 2022-07-07 ENCOUNTER — Other Ambulatory Visit (HOSPITAL_COMMUNITY): Payer: Self-pay | Admitting: Student in an Organized Health Care Education/Training Program

## 2022-07-07 DIAGNOSIS — J309 Allergic rhinitis, unspecified: Secondary | ICD-10-CM

## 2022-07-07 DIAGNOSIS — F331 Major depressive disorder, recurrent, moderate: Secondary | ICD-10-CM

## 2022-07-07 NOTE — Telephone Encounter (Signed)
Patient needs to see provider for additional refills.

## 2022-07-08 ENCOUNTER — Ambulatory Visit: Payer: Medicare Other | Attending: Otolaryngology | Admitting: Otolaryngology

## 2022-07-08 ENCOUNTER — Encounter (INDEPENDENT_AMBULATORY_CARE_PROVIDER_SITE_OTHER): Payer: Self-pay | Admitting: Otolaryngology

## 2022-07-08 ENCOUNTER — Other Ambulatory Visit: Payer: Self-pay

## 2022-07-08 ENCOUNTER — Other Ambulatory Visit (INDEPENDENT_AMBULATORY_CARE_PROVIDER_SITE_OTHER): Payer: Medicare Other | Admitting: Rheumatology

## 2022-07-08 VITALS — BP 143/81 | HR 99 | Temp 99.0°F | Ht 65.0 in | Wt 216.5 lb

## 2022-07-08 DIAGNOSIS — H9201 Otalgia, right ear: Secondary | ICD-10-CM | POA: Insufficient documentation

## 2022-07-08 DIAGNOSIS — R220 Localized swelling, mass and lump, head: Secondary | ICD-10-CM

## 2022-07-08 DIAGNOSIS — H938X9 Other specified disorders of ear, unspecified ear: Secondary | ICD-10-CM | POA: Insufficient documentation

## 2022-07-08 LAB — SEDIMENTATION RATE: ERYTHROCYTE SEDIMENTATION RATE (ESR): 25 mm/hr — ABNORMAL HIGH (ref 0–20)

## 2022-07-08 LAB — C-REACTIVE PROTEIN(CRP),INFLAMMATION: CRP INFLAMMATION: 5.9 mg/L (ref <8.0)

## 2022-07-08 NOTE — H&P (Signed)
PATIENT NAME:  [name]  MRN:  [id number]  DOB:  Dec 20, 1955  DATE OF SERVICE: 07/08/2022    Chief Complaint:  Ear Pain      HPI:  [name] is a 66 y.o. female with 2 week history of right burning ear pain and pressure, iunitially 10/10 pain and now 5/10. At the time of onset, she also had right facial swelling and drooping of right face, which has somewhat improved since. She also had mildly worsened hearing and nonpulsatile tinnitus in right ear, more than her chronic existing hearing loss, though she does not feel the need to repeat her audiogram. She has also had right neck muscle tightness, was prescribed a muscle relaxant by her PCP. Also reports occasional hoarseness.    She has had hx of frequent ear infections throughout adulthood, no hx of Vts or perforated TMs. She also endorses balance problems. She has had long time headaches in all areas of her head worse when she is sitting up. She reports vision changes in both eyes that she describes as on and off "seeing in black and white, like an Xray", and floaters. She's had a history of retinal detachment and several eye surgeries including cataract surgery.     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 She has had right facial weakness ever since her parotidectomy.     Past Medical History:  Past Medical History:   Diagnosis Date    Anxiety     Arthritis     Cancer (CMS [facility])     internal skin cancer    Chronic pain     lower back    Depression     Dyspnea on exertion     Essential hypertension 02/16/2017    patient denies    GERD (gastroesophageal reflux disease)     controlled w/med    Headache     Hearing loss     Heart murmur 1979    benign    Hyperlipidemia LDL goal < 100 08/29/2013    denies    Migraine     MINOR CAD (coronary artery disease) 11/10/2012    no treatment other than cholesterol medications; follows regularly with K.Dalton, PA     Multinodular goiter     Muscle weakness     neck     Obesity     Peripheral edema     Rash     face only eczema    Rheumatic fever     in 2876 no complications    S/P thyroidectomy     Sleep apnea     Squamous cell carcinoma 02/06/2015    right side of neck    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    Tinnitus     Vaginal prolapse 2012    surgery improved significantly    Wears glasses            Past Surgical History:  Past Surgical History:   Procedure Laterality Date    CHALAZION EXCISION Left 12/22/2021    and conj lesion excision from caruncle    COLONOSCOPY  01/06/2009    COLONOSCOPY performed by Murvin Donning, AHMED F at [facility]  03/05/2010    GASTROSCOPY performed by Delrae Alfred, SWATI at [facility]  03/05/2010    GASTROSCOPY WITH BIOPSY performed by Jocelyn Lamer at  [facility]    HX ADENOIDECTOMY      HX ANKLE FRACTURE [place]  2007    left distal fibula, casted    HX APPENDECTOMY      HX CHOLECYSTECTOMY      HX COLONOSCOPY      HX CYSTOCELE REPAIR  09/24/2009    HX HAND SURGERY  2012    for Carpal tunnel : right side treated    HX HEART CATHETERIZATION      HX HYSTERECTOMY      HX OOPHORECTOMY      left ovary removed    HX PAROTIDECTOMY       "surgical clamp"     HX PARTIAL THYROIDECTOMY Right 04/05/2019    R hemithyroidectomy for nodule, final path follicular adenoma    HX TONSILLECTOMY      HX TOTAL VAGINAL HYSTERECTOMY  1979    HX WISDOM TEETH EXTRACTION      HX WRIST FRACTURE [place]  2007    [place] injury    PARATHYROID GLAND SURGERY      PARS PLANA VITRECTOMY Right 11/02/2021    Pars plana vitrectomy, nternal limiting membrane (ILM) and epiretinal membrane (ERM) peel, Injection of 20%  sf6 gas, Subtenon's injection of Cefuroxime 50 mg and Dexamethasone 2 mg    PB REVISE ULNAR NERVE AT ELBOW Left 1979    PB UPPER GI ENDOSCOPY,BIOPSY  12/19/2007    patulous GE junction zone, erythema, nonerosive GERD    SEPTOPLASTY             Family History:  Family Medical History:        Problem Relation (Age of Onset)    Bipolar Disorder Daughter, Paternal Uncle    Breast Cancer Paternal Aunt, Paternal Aunt, Paternal 28, Paternal 1, Paternal 89, Other    Cancer Paternal Aunt, Paternal 16, Paternal 37, Paternal 9, Paternal 85, Other (56)    Congestive Heart Failure Father (15)    Coronary Artery Disease Father, Mother (30)    Diabetes Mother    Heart Attack Father, Sister    High Cholesterol Mother    Hypertension (High Blood Pressure) Mother, Brother, Sister, Sister    Kidney Disease Sister    Leukemia Paternal Uncle    Stroke Paternal Grandmother    Thyroid Disease Sister              Social History:  Social History     Tobacco Use   Smoking Status Former    Packs/day: 2.00    Years: 40.00    Pack years: 80.00    Types: Cigarettes    Quit date: 01/22/2005    Years since quitting: 17.4   Smokeless Tobacco Never     Social History     Substance and Sexual Activity   Alcohol Use No    Alcohol/week: 0.0 standard drinks     Social History     Occupational History    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     Employer: NOT EMPLOYED     Comment: occasionally       Medications:  Outpatient Medications Marked as Taking for the 07/08/22 encounter (Office Visit) with Levonne Lapping, MD   Medication Sig    Azelaic Acid 20 % Cream Apply topically Twice daily    baclofen (LIORESAL) 5 mg Oral Tablet Take 1 Tablet (5 mg total) by mouth Three times a day as needed    buPROPion (WELLBUTRIN XL) 450 mg extended release 24 hr tablet Take 1 Tablet (450 mg  total) by mouth Once a day    cetirizine (ZYRTEC) 10 mg Oral Tablet Take 1 Tablet (10 mg total) by mouth Once a day    clobetasoL (TEMOVATE) 0.05 % Ointment Apply topically Twice daily    diclofenac sodium (VOLTAREN) 1 % Gel Apply topically Four times a day - before meals and bedtime    FLUoxetine 20 mg Oral Tablet Take 3 tablets daily (60 mg total)    fluticasone propionate (FLONASE) 50 mcg/actuation Nasal Spray, Suspension Administer 2  Sprays into each nostril Once a day    gabapentin (NEURONTIN) 600 mg Oral Tablet Take 1 Tablet (600 mg total) by mouth Twice daily    loratadine (CLARITIN) 10 mg Oral Tablet Take 1 Tablet (10 mg total) by mouth Once a day    moxifloxacin (VIGAMOX) 0.5 % Ophthalmic Drops FOR USE IN THE SURGICAL SUITE AND 4 TIMES A DAY INTO RIGHT EYE AS DIRECTED    nitroGLYCERIN (NITROSTAT) 0.4 mg Sublingual Tablet, Sublingual 1 Tab (0.4 mg total) by Sublingual route Every 5 minutes as needed for Chest pain for up to 3 doses for 3 doses over 15 minutes    pantoprazole (PROTONIX) 40 mg Oral Tablet, Delayed Release (E.C.) Take 1 Tablet (40 mg total) by mouth Once a day    prednisoLONE acetate (PRED FORTE) 1 % Ophthalmic Drops, Suspension FOR USE IN THE SURGICAL SUITE AND 4 TIMES A DAY INTO RIGHT EYE AS DIRECTED       Allergies:  Allergies   Allergen Reactions    Cymbalta [Duloxetine]      Rash on feet and hands    Blue Dye      Told to avoid due to patch test result by [facility]    Flavoring Agent  Other Adverse Reaction (Add comment)     Patient unsure of reaction    Lipitor [Atorvastatin] Myalgia     Muscle pain    Nickel      Blisters  "Allergic to all kinds of metal"     Codeine Itching     RASH    Mobic [Meloxicam] Nausea/ Vomiting     Nervous and shakes     Naprosyn [Naproxen] Nausea/ Vomiting    Nsaids (Non-Steroidal Anti-Inflammatory Drug) Nausea/ Vomiting       Review of Systems:  Do you have any fevers: no   Any weight change: no   Change in your vision: yes    Chest Pain: no   Shortness of Breath: no   Stomach pain: no   Urinary difficulity: no   Joint Pain: yes   Skin Problems: no   Weakness or Numbness: yes   Easy Bruising or Bleeding: yes   Excessive Thirst: no   Seasonal Allergies: yes    All other systems reviewed and found to be negative except as per HPI.    Physical Exam:  Blood pressure (!) 143/81, pulse 99, temperature 37.2 C (99 F), height 1.651 m (5' 5" ), weight 98.2 kg (216 lb 7.9 oz), not currently  breastfeeding.  Body mass index is 36.03 kg/m.  General Appearance: Pleasant, cooperative, healthy, and in no acute distress.  Eyes: Conjunctivae/corneas clear  Head and Face: Normocephalic, atraumatic.  Face symmetric, no obvious lesions. Tenderness to palpation over the Right Temporal area. Tenderness to deep palpation over the right posterior cheek. No facial swelling appreciated.  Pinnae: Normal shape and position.   External auditory canals:  Patent without inflammation.  normal appearance, normal TMs bilaterally  Nose:  External pyramid midline.  No purulent drainage.   Oral Cavity/Oropharynx: No mucosal lesions, masses, or pharyngeal asymmetry.  Hypopharynx/Larynx: Indirect mirror laryngoscopy revealed no hypopharyngeal or laryngeal masses or lesions, normal laryngeal mobility, and voice normal.  Neck:  No palpable thyroid, salivary gland, or neck masses.  Heme/Lymph:  No cervical adenopathy.  Cardiovascular:  Good perfusion of upper extremities.  No cyanosis of the hands or fingers.  Lungs: No apparent stridorous breathing. No acute distress.  Skin: Skin warm and dry.  Neurologic: Cranial nerves:  Right CN VII weakness from cheek down. Left CN VII intact. All other CNs grossly intact.  Psychiatric:  Alert and oriented x 3.    Procedure:       Data Reviewed:  CT, MRI, and Other   She had an elevated ESR to 37 and CRP to 15.3 in 06/2021.     US SOFT TISSUE NECK performed on 06/25/2022 10:07 AM.  FINDINGS:  Remote history of right parotidectomy and right hemithyroidectomy.  No discrete masses or adenopathy appreciated along the area of concern in the right upper neck  IMPRESSION:  No sonographic abnormalities involving the visualized soft tissues along the right upper neck.      MRI SPINE CERVICAL WO CONTRAST performed on 06/19/2022 11:21 AM.  FINDINGS:  The vertebral alignment demonstrates mild straightening of the cervical curvature. There is mild loss of disc height at all the levels along with mild generalized  facet and uncovertebral arthropathy.  The C2-C3, C3-C4 and C4-C5 levels demonstrate patent spinal canal and neural foramina. Mild disc bulge is present at C4-C5.  The C5-C6 and C6-C7 levels demonstrate disc bulge resulting in mild spinal stenosis. There is significant right-sided foraminal encroachment at C5-C6 and mild bilateral foraminal encroachment at C6-C7.  The C7-T1 level demonstrates patent spinal canal and neural foramina.  The spinal cord demonstrates preserved caliber and signal and prevertebral paraspinal soft tissues are unremarkable.  IMPRESSION:  Mild cervical spondylosis relatively more focal at the mid cervical spine at the C5-C6 and C6-C7 levels. Findings are similar to the prior MRI examination.      MRI BRAIN W/WO CONTRAST performed on 06/16/2022 1:49 PM  FINDINGS:   Parenchyma: No restricted diffusion to suggest acute infarction. No susceptibility artifact to suggest hemorrhage. No focal brain mass. There are mild to moderate periventricular white matter FLAIR hyperintensities which are nonspecific but favored to represent chronic microangiopathic ischemic changes in this age group. Generalized brain volume loss. No abnormal parenchymal enhancement. The trigeminal nerves are grossly normal on this nontailored study.  Midline structures: The sella is normal for age.  Ventricles and extra-axial spaces: Ventricles are normal in caliber and configuration. No extra-axial fluid collections. No abnormal leptomeningeal enhancement.  Flow voids: Unremarkable  Calvarium and soft tissues: Bone marrow signal is within normal limits. No suspicious soft tissue lesions.  Mastoid air cells and paranasal sinuses: The mastoid air cells are clear. The paranasal sinuses are clear.  IMPRESSION:  1.No acute intracranial process.  2.Chronic ischemic changes, as above.      Assessment:  Assessment/Plan   1. Otalgia of right ear    2. Facial swelling    3. Ear fullness    Hx of total parotidectomy for pleomorphic  adenoma, right hemithyroidectomy for nodules, and chronic right facial weakness. Presents with 2 weeks of Right-sided burning ear pain, pressure, facial swelling and weakness (improving), and tenderness to palpation of right temporal and parotid area, as well as b/l abnormal vision changes. Ddx includes Temporal Arteritis, though her clinical picture is  overall unclear. She has appointment with Ophthalmology in October. She deferred repeat audiology eval at this time.    Plan:  Orders Placed This Encounter    SEDIMENTATION RATE    C-REACTIVE PROTEIN(CRP),INFLAMMATION   - will defer repeat audiology eval at this time given patient preference  - ESR and CRP ordered to assess possible Temporal Arteritis  - RTC as needed  - Counseled patient if vision gets worse to move up her ophtho appointment scheduled in October      Dianah Field, MD 07/08/2022, 18:14      PCP:  Alvis Lemmings, PA-C  [facility]  [place] [phone]   REF:  Sharman Crate, PA-C  [address]  [address]  [place],  [place] [address]

## 2022-07-13 ENCOUNTER — Encounter (INDEPENDENT_AMBULATORY_CARE_PROVIDER_SITE_OTHER): Payer: Self-pay | Admitting: Ophthalmology

## 2022-07-13 ENCOUNTER — Encounter (INDEPENDENT_AMBULATORY_CARE_PROVIDER_SITE_OTHER): Payer: Self-pay

## 2022-07-13 NOTE — Progress Notes (Signed)
Prior Authorization    New Prior Glade Stanford 07/13/2022    Medication/Strength: Bupropion '450mg'$     Insurance: Medicaid      Prior Authorizations can take up to 72 hours for approval.    Looman. Otila Kluver, CASE MANAGER  07/13/2022, 12:55

## 2022-07-13 NOTE — Progress Notes (Signed)
Called pt to schedule surgery with Dr. Laurance Flatten.  Unable to leave a voicemail.  Will try calling back   Seletha Zimmermann A. Helene Kelp, [place] 07/13/2022 10:50

## 2022-07-14 ENCOUNTER — Encounter (INDEPENDENT_AMBULATORY_CARE_PROVIDER_SITE_OTHER): Payer: Self-pay

## 2022-07-14 ENCOUNTER — Ambulatory Visit (INDEPENDENT_AMBULATORY_CARE_PROVIDER_SITE_OTHER): Payer: Self-pay

## 2022-07-14 ENCOUNTER — Other Ambulatory Visit (HOSPITAL_COMMUNITY): Payer: Self-pay | Admitting: Psychiatry

## 2022-07-14 DIAGNOSIS — F331 Major depressive disorder, recurrent, moderate: Secondary | ICD-10-CM

## 2022-07-14 MED ORDER — BUPROPION HCL XL 450 MG 24 HR TABLET, EXTENDED RELEASE
450.0000 mg | ORAL_TABLET | Freq: Every day | ORAL | 2 refills | Status: DC
Start: 2022-07-14 — End: 2022-09-14

## 2022-07-14 MED ORDER — FLUOXETINE 20 MG TABLET
ORAL_TABLET | ORAL | 1 refills | Status: DC
Start: 2022-07-14 — End: 2022-12-30

## 2022-07-14 NOTE — Progress Notes (Signed)
Prior Authorization    New Prior Auth Sent 07/14/2022    Medication/Strength: Fluoxetine 20 mg tabs    Insurance: [employer]  Rx bin: [id number]  [id number]  Group:[id number]  ID number : [id number]  [facility]      Prior Authorizations can take up to 72 hours for approval.    Looman. Otila Kluver, CASE MANAGER  07/14/2022, 10:39

## 2022-07-14 NOTE — Telephone Encounter (Signed)
Ordered refills for Bupropion 450 mg XL daily #30/2 refills and Fluoxetine 20 mg three daily #270/40-monthsupply/one refill. See orders.     Brylea Pita N. CJeannetta Ellis M.D.   Psychiatrist     Deone Omahoney NMeribeth Mattes MD, 07/14/2022, 11:01

## 2022-07-14 NOTE — Progress Notes (Signed)
Prior Authorization    New Prior Auth Sent 07/14/2022    Medication/Strength: Conni Elliot '450mg'$     Insurance: [employer]  Rx bin: [id number]  [id number]  Group:[id number]  ID number : [id number]  [facility]      Prior Authorizations can take up to 72 hours for approval.    Looman. Otila Kluver, CASE MANAGER  07/14/2022, 10:39

## 2022-07-20 ENCOUNTER — Encounter (HOSPITAL_COMMUNITY): Payer: Self-pay

## 2022-07-20 ENCOUNTER — Inpatient Hospital Stay (HOSPITAL_COMMUNITY)
Admission: RE | Admit: 2022-07-20 | Discharge: 2022-07-20 | Disposition: A | Discharge status: Home / Self Care | Payer: PRIVATE HEALTH INSURANCE | Source: Ambulatory Visit

## 2022-07-20 HISTORY — DX: Disorder of thyroid, unspecified: E07.9

## 2022-07-20 HISTORY — DX: Unintended awareness under general anesthesia during procedure, initial encounter: T88.53XA

## 2022-07-20 HISTORY — DX: Other general symptoms and signs: R68.89

## 2022-07-23 DIAGNOSIS — H25813 Combined forms of age-related cataract, bilateral: Secondary | ICD-10-CM

## 2022-07-27 ENCOUNTER — Ambulatory Visit (HOSPITAL_BASED_OUTPATIENT_CLINIC_OR_DEPARTMENT_OTHER): Payer: Self-pay

## 2022-07-27 ENCOUNTER — Inpatient Hospital Stay (HOSPITAL_BASED_OUTPATIENT_CLINIC_OR_DEPARTMENT_OTHER)
Admission: RE | Admit: 2022-07-27 | Discharge: 2022-07-27 | Disposition: A | Discharge status: Home / Self Care | Payer: Medicare Other | Source: Ambulatory Visit

## 2022-07-27 ENCOUNTER — Encounter (INDEPENDENT_AMBULATORY_CARE_PROVIDER_SITE_OTHER): Payer: Self-pay | Admitting: Anesthesiology

## 2022-07-27 ENCOUNTER — Ambulatory Visit: Payer: Medicare Other | Attending: Anesthesiology | Admitting: Anesthesiology

## 2022-07-27 ENCOUNTER — Other Ambulatory Visit: Payer: Self-pay

## 2022-07-27 VITALS — BP 126/93 | HR 106 | Temp 98.1°F | Ht 65.0 in | Wt 212.5 lb

## 2022-07-27 DIAGNOSIS — M4802 Spinal stenosis, cervical region: Secondary | ICD-10-CM | POA: Insufficient documentation

## 2022-07-27 DIAGNOSIS — M542 Cervicalgia: Secondary | ICD-10-CM

## 2022-07-27 DIAGNOSIS — M5412 Radiculopathy, cervical region: Secondary | ICD-10-CM | POA: Insufficient documentation

## 2022-07-27 DIAGNOSIS — M4722 Other spondylosis with radiculopathy, cervical region: Secondary | ICD-10-CM

## 2022-07-27 DIAGNOSIS — M47812 Spondylosis without myelopathy or radiculopathy, cervical region: Secondary | ICD-10-CM

## 2022-07-27 NOTE — Progress Notes (Signed)
[facility]  Operated by [facility]  Progress Note    Name: [name] MRN:  [id number]   Date: 07/27/2022 Age: 66 y.o.       Referring Provider:   No referring provider defined for this encounter.      Subjective:   [name] is a 66 year old right handed female  returning to clinic with scoliosis XR and MRI brain and cervical.     She was seen on 06/01/22. No significant lumbar spine pathology to explain her symptoms. 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 Had received significant improvement from a C6 SNRB in the past.     She was given a PT rx and was advised to f/u with a new cervical MRI. During her 3rd PT session, she had acute onset of right facial burning and tingling and swelling with some drooping that started during ROM neck exercises. Last seen 06/15/22 and reported ear symptoms and above symptoms. Plan was MRI C, B and ENT referral.     Today, she reports lateral neck pain and head aches. She reports right arm pain extending to thumb. Previous nerve root block relieved symptoms 6 months Reports pain 8/10. July 25, 2022 onset of weakness and shaking with confusion. Seen by ENT. Rule out temporal arteritis.     Current Outpatient Medications   Medication Sig    Azelaic Acid 20 % Cream Apply topically Twice daily    baclofen (LIORESAL) 5 mg Oral Tablet Take 1 Tablet (5 mg total) by mouth Three times a day as needed    buPROPion (WELLBUTRIN XL) 450 mg extended release 24 hr tablet Take 1 Tablet (450 mg total) by mouth Once a day    cetirizine (ZYRTEC) 10 mg Oral Tablet Take 1 Tablet (10 mg total) by mouth Once a day    clobetasoL (TEMOVATE) 0.05 % Ointment Apply topically Twice daily    diclofenac sodium (VOLTAREN) 1 % Gel Apply topically Four times a day - before meals and bedtime    FLUoxetine 20 mg Oral Tablet Take 3 tablets daily (60 mg total)    fluticasone propionate (FLONASE) 50 mcg/actuation Nasal Spray,  Suspension Administer 2 Sprays into each nostril Once a day    gabapentin (NEURONTIN) 600 mg Oral Tablet Take 1 Tablet (600 mg total) by mouth Twice daily (Patient taking differently: Take 1 Tablet (600 mg total) by mouth Every morning)    loratadine (CLARITIN) 10 mg Oral Tablet Take 1 Tablet (10 mg total) by mouth Once a day (Patient not taking: Reported on 07/20/2022)    moxifloxacin (VIGAMOX) 0.5 % Ophthalmic Drops FOR USE IN THE SURGICAL SUITE AND 4 TIMES A DAY INTO RIGHT EYE AS DIRECTED    nitroGLYCERIN (NITROSTAT) 0.4 mg Sublingual Tablet, Sublingual 1 Tab (0.4 mg total) by Sublingual route Every 5 minutes as needed for Chest pain for up to 3 doses for 3 doses over 15 minutes    pantoprazole (PROTONIX) 40 mg Oral Tablet, Delayed Release (E.C.) Take 1 Tablet (40 mg total) by mouth Once a day    prednisoLONE acetate (PRED FORTE) 1 % Ophthalmic Drops, Suspension FOR USE IN THE SURGICAL SUITE AND 4 TIMES A DAY INTO RIGHT EYE AS DIRECTED      Objective:   Vital Signs:  BP (!) 126/93   Pulse (!) 106   Temp 36.7 C (98.1 F) (Thermal Scan)   Ht 1.651 m ('5\' 5"'$ )   Wt  96.4 kg (212 lb 8.4 oz)   BMI 35.37 kg/m       Constitutional  General appearance: In no acute distress   Eyes: Ophthalmic exam of optic discs and posterior segments: EOM intact   HEENT:    HEENT:  Normocephalic   Musculoskeletal  Gait and Station: : antalgic   Muscle strength (upper extremities): : Normal  Muscle strength (lower extremities): : Normal  Muscle tone (upper extremities): : Normal  Muscle tone (lower extremities): : Normal  Sensation: Normal  Coordination: Normal  Hoffman's reflex: Left: negative Right: negative    Neurological  Orientation: Normal  Recent and remote memory: Normal  Attention span and concentration: Normal  Language: Normal  Fund of knowledge: Normal    Data reviewed  07/27/22 XR scoliosis [place] PACS:  Stable     06/16/22 MRI brain w/wo [place] PACS:  IMPRESSION:  1.No acute intracranial process.  2.Chronic ischemic changes,  as above.    06/19/22 MRI cervical [place] PACS:  IMPRESSION:  Mild cervical spondylosis relatively more focal at the mid cervical spine at the C5-C6 and C6-C7 levels. Findings are similar to the prior MRI examination    Discussions with other providers:   Reviewed chart notes     Assessment:    Assessment/Plan   1. Cervical radiculopathy    2. Cervical spondylosis      Orders Placed This Encounter    CT CERVICAL NERVE ROOT BLOCK        Recommendations:  [name] is a 66 year old right handed female  returning to clinic with scoliosis XR and MRI brain and cervical:    -Right C6 transformational nerve block   -Return to clinic in 6 weeks     Cheral Marker, APRN,FNP-BC

## 2022-07-27 NOTE — Progress Notes (Signed)
I personally saw and evaluated the patient. See mid-level's note for additional details. My findings/participation are : R C6 radiculopathy clinically. MRI evaluated. Will order a C6 SNRB. FU in 6 weeks.    Debbora Dus, MD

## 2022-07-29 DIAGNOSIS — M4184 Other forms of scoliosis, thoracic region: Secondary | ICD-10-CM

## 2022-07-29 DIAGNOSIS — M5126 Other intervertebral disc displacement, lumbar region: Secondary | ICD-10-CM

## 2022-07-29 DIAGNOSIS — M47819 Spondylosis without myelopathy or radiculopathy, site unspecified: Secondary | ICD-10-CM

## 2022-08-02 ENCOUNTER — Ambulatory Visit: Payer: Medicare Other | Attending: Urology | Admitting: Urology

## 2022-08-02 ENCOUNTER — Encounter (INDEPENDENT_AMBULATORY_CARE_PROVIDER_SITE_OTHER): Payer: Self-pay | Admitting: Urology

## 2022-08-02 ENCOUNTER — Other Ambulatory Visit: Payer: Self-pay

## 2022-08-02 VITALS — BP 157/75 | HR 86 | Temp 96.6°F | Ht 65.0 in | Wt 213.8 lb

## 2022-08-02 DIAGNOSIS — N390 Urinary tract infection, site not specified: Secondary | ICD-10-CM | POA: Insufficient documentation

## 2022-08-02 DIAGNOSIS — R300 Dysuria: Secondary | ICD-10-CM

## 2022-08-02 MED ORDER — CIPROFLOXACIN 500 MG TABLET
500.0000 mg | ORAL_TABLET | Freq: Two times a day (BID) | ORAL | 3 refills | Status: DC
Start: 2022-08-02 — End: 2023-02-01

## 2022-08-02 NOTE — Progress Notes (Signed)
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  Tried anticholinergics, little improvement.  Biggest symptom is dysuria.  Overall, she is doing ok.  She needs a refill of Cipro.     O:  Examination:  Well-developed, well-nourished woman  in no apparent distress.  Abdomen is free of masses, tenderness or hepatosplenomegaly  CVA tenderness: none .  No suprapubic fullness.  PVR=0 cc.     A:  Dysuria     P:  Trial of Cipro 400 mg BID -- 5 day courses when symptomatic.     The patient was told of three important components they can undertake to improve their voiding:  They are:  (1)  Scheduled voiding, (2) Limiting liquids prior to bedtime, and (3) Avoiding bladder irritants.  I discussed each component with the patient in detail as written below:      First, [name] was instructed to assist their voiding by voiding on a schedule of approximately every 2-3 hours even if they do not have to void.  In addition, they are instructed to double-void.  This means they should void in the bathroom and then attempt to void again a few minutes later, this may allow for elimination of some more left over urine.  This maneuver can improve bladder function.     Second,  [name] was instructed to limit liquids approximately 2 hours prior to bedtime.  This is because the increased fluid intake can lead to increased urine output overnight.  I advised the patient that is normal to void up to 2 times per night.       Finally, [name] was told about reduction of bladder irritants.  This may include maneuvers such as limiting the the intake of acid foods, spicy foods and caffeinated beverages.  In some patients, these foods/beverages can cause bladder irritation and symptoms of urgency, frequency, pelvic and urethral pain.  If such foods cause problems, they should be avoided.     Return in 1 year or sooner if issues.    Woodfin Ganja, MD, MBA, FACS  Professor and  [facility]  Department of Urology    Certified in Pain Management  [employer] in Tra[employer]

## 2022-08-02 NOTE — Addendum Note (Signed)
Addended by: Rondall Allegra on: 08/02/2022 09:23 AM     Modules accepted: Orders

## 2022-08-05 ENCOUNTER — Other Ambulatory Visit: Payer: Self-pay

## 2022-08-05 ENCOUNTER — Inpatient Hospital Stay (HOSPITAL_BASED_OUTPATIENT_CLINIC_OR_DEPARTMENT_OTHER)
Admission: RE | Admit: 2022-08-05 | Discharge: 2022-08-05 | Disposition: A | Discharge status: Home / Self Care | Payer: Medicare Other | Source: Ambulatory Visit

## 2022-08-05 ENCOUNTER — Ambulatory Visit: Payer: Medicare Other | Attending: Ophthalmology | Admitting: Ophthalmology

## 2022-08-05 ENCOUNTER — Encounter (INDEPENDENT_AMBULATORY_CARE_PROVIDER_SITE_OTHER): Payer: Self-pay | Admitting: Ophthalmology

## 2022-08-05 DIAGNOSIS — D23122 Other benign neoplasm of skin of left lower eyelid, including canthus: Secondary | ICD-10-CM

## 2022-08-05 DIAGNOSIS — Z8669 Personal history of other diseases of the nervous system and sense organs: Secondary | ICD-10-CM

## 2022-08-05 DIAGNOSIS — Z9889 Other specified postprocedural states: Secondary | ICD-10-CM

## 2022-08-05 DIAGNOSIS — H029 Unspecified disorder of eyelid: Secondary | ICD-10-CM | POA: Insufficient documentation

## 2022-08-05 DIAGNOSIS — H2513 Age-related nuclear cataract, bilateral: Secondary | ICD-10-CM

## 2022-08-05 NOTE — Progress Notes (Addendum)
[facility]  [place] [address]  Operated by [place]         Patient Name: [name]  MRN#: [id number]  [place]: [birth year]    Date of Service: 08/05/2022    Chief Complaint    Follow Up - Retina Problem         Kilani Joffe is a 66 y.o. female who presents today for evaluation/consultation of:  HPI    Taimane Stimmel is a 66 y.o. female here for 6 month f/u DFE, OCT OU  Pt states eyes have been hurting OD is worse, worsens at night.  Pt states using OTC AT gtts.   Pt states va is worsening.   Last edited by Wallie Char, [place] on 08/05/2022  9:58 AM.        ROS    Positive for: Eyes  Negative for: Constitutional, Gastrointestinal, Neurological, Skin, Genitourinary, Musculoskeletal, HENT, Endocrine, Cardiovascular, Respiratory, Psychiatric, Allergic/Imm, Heme/Lymph  Last edited by Wallie Char, COA on 08/05/2022  9:57 AM.         All other systems Negative    Wallie Char, COA  08/05/2022, 10:06        Base Eye Exam       Visual Acuity (Snellen - Linear)         Right Left    Dist cc 20/200 20/30 -1    Dist ph cc 20/70 -2 20/25 -2      Correction: Glasses              Tonometry (Tonopen, 10:06 AM)         Right Left    Pressure 12 15              Pupils         Shape React APD    Right Round Minimal None    Left Round Minimal None              Visual Fields         Right Left     Full Full              Extraocular Movement         Right Left     Full Full              Neuro/Psych       Oriented x3: Yes    Mood/Affect: Normal              Dilation       Both eyes: 1.0% Mydriacyl, 2.5% Phenylephrine, 0.5% Proparacaine @ 10:06 AM                  Slit Lamp and Fundus Exam       External Exam         Right Left    External Normal Normal              Slit Lamp Exam         Right Left    Lids/Lashes Normal , Ecchymosis    Conjunctiva/Sclera White and quiet White and quiet    Cornea Clear Clear    Anterior Chamber Deep and quiet  Deep and quiet    Iris Round and reactive Round and reactive    Lens 2+ NS 1+ NS    Anterior Vitreous Normal Normal              Fundus Exam  Right Left    Disc Normal Normal    C/D Ratio 0.35 0.3    Macula s/p peel, attached Normal    Vessels Normal Normal    Periphery Normal Normal                    MD Addition to HPI: Pt presents today for a 6 month f/u. Pt c/o eye pain OU. Pt states pain in OD is worse and states it worsens at night. Pt also c/o worsening VA. Pt is using OTC AT's.          ENCOUNTER DIAGNOSES     ICD-10-CM   1. Lesion of left lower eyelid  H02.9   2. 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 Laurance Flatten      s/p PPV/FTMH repair/SF6 OD on 11/02/21  - mac hole closed today with persistent outer retinal irregularities   - if subfoveal irregularities/SRF progress--consider Trusopt   - will continue to monitor, fu in 1 year      Follow up:    I have asked Topanga Alvelo to follow up in 1 year DFE OCT OU.          I have seen and examined the above patient. I discussed the above diagnoses listed in the assessment and the above ophthalmic plan of care with the patient and patient's family. All questions were answered. I reviewed and, when necessary, made changes to the technician/resident note, documented ophthalmology exam, chief complaint, history of present illness, allergies, review of systems, past medical, past surgical, family and social history. I personally reviewed and interpreted all testing and/or imaging performed at this visit and agree with the resident's or fellow's interpretation. Any exceptions/additions are edited/noted in the relevant encounter fields.    I am scribing for and in the presence of Dr. Gala Murdoch for services provided on 08/05/2022.  Claudie Fisherman, SCRIBE   [place], [place]  08/05/2022, 10:20        When a scribe  documentaion is present , I attest that I personally performed the services described in this documentation, as scribed  in my presence, and it is both accurate  and complete.  When a resident or fellow documentation is present, I attest that I saw and examined the patient.  I reviewed the resident's note.  I agree with the findings and plan of care as documented in the resident's note.  Any exceptions/additions are edited/noted.  When performed, I also attest that I reviewed the image(s)  and testing(s)  and agree with the interpretation and report as documented by the resident/fellow.  Exceptions as noted.    Con Memos, MD

## 2022-08-11 ENCOUNTER — Other Ambulatory Visit (INDEPENDENT_AMBULATORY_CARE_PROVIDER_SITE_OTHER): Payer: Self-pay | Admitting: NURSE PRACTITIONER, FAMILY

## 2022-08-11 ENCOUNTER — Other Ambulatory Visit (HOSPITAL_BASED_OUTPATIENT_CLINIC_OR_DEPARTMENT_OTHER): Payer: Self-pay | Admitting: Physician Assistant

## 2022-08-11 DIAGNOSIS — G518 Other disorders of facial nerve: Secondary | ICD-10-CM

## 2022-08-11 DIAGNOSIS — J309 Allergic rhinitis, unspecified: Secondary | ICD-10-CM

## 2022-08-11 NOTE — Telephone Encounter (Signed)
LOV 07/08/22

## 2022-08-12 ENCOUNTER — Other Ambulatory Visit (HOSPITAL_BASED_OUTPATIENT_CLINIC_OR_DEPARTMENT_OTHER): Payer: Self-pay | Admitting: Physician Assistant

## 2022-08-12 DIAGNOSIS — G518 Other disorders of facial nerve: Secondary | ICD-10-CM

## 2022-08-18 ENCOUNTER — Ambulatory Visit (INDEPENDENT_AMBULATORY_CARE_PROVIDER_SITE_OTHER): Payer: Self-pay | Admitting: Ophthalmology

## 2022-08-25 ENCOUNTER — Ambulatory Visit (INDEPENDENT_AMBULATORY_CARE_PROVIDER_SITE_OTHER): Payer: Self-pay | Admitting: Ophthalmology

## 2022-08-27 ENCOUNTER — Encounter (HOSPITAL_BASED_OUTPATIENT_CLINIC_OR_DEPARTMENT_OTHER): Payer: Self-pay | Admitting: Student in an Organized Health Care Education/Training Program

## 2022-08-27 ENCOUNTER — Other Ambulatory Visit (HOSPITAL_BASED_OUTPATIENT_CLINIC_OR_DEPARTMENT_OTHER): Payer: Medicare Other

## 2022-08-27 ENCOUNTER — Other Ambulatory Visit: Payer: Self-pay

## 2022-08-27 ENCOUNTER — Ambulatory Visit
Payer: Medicare Other | Attending: Student in an Organized Health Care Education/Training Program | Admitting: Student in an Organized Health Care Education/Training Program

## 2022-08-27 ENCOUNTER — Inpatient Hospital Stay (HOSPITAL_BASED_OUTPATIENT_CLINIC_OR_DEPARTMENT_OTHER)
Admission: RE | Admit: 2022-08-27 | Discharge: 2022-08-27 | Disposition: A | Discharge status: Home / Self Care | Payer: Medicare Other | Source: Ambulatory Visit

## 2022-08-27 VITALS — BP 110/70 | HR 66 | Temp 98.1°F | Wt 212.1 lb

## 2022-08-27 DIAGNOSIS — M79644 Pain in right finger(s): Secondary | ICD-10-CM | POA: Insufficient documentation

## 2022-08-27 MED ORDER — PREDNISONE 10 MG TABLET
ORAL_TABLET | ORAL | 0 refills | Status: AC
Start: 2022-08-27 — End: 2022-09-08

## 2022-08-27 NOTE — Progress Notes (Signed)
Department of Family Medicine   Progress Note    [name]  MRN: [id number]  DOB: January 02, 1956  Date of Service: 08/27/2022    CHIEF COMPLAINT  Chief Complaint   Patient presents with    Thumb Pain       SUBJECTIVE  [name] is a 66 y.o. female who presents to clinic for right thumb pain.     She reports she began to get pain diffusely over the right thumb roughly a week ago.  Denies any specific injury to the thumb or wrist.  Does feel there is radiation of the pain up into the right forearm.  Notes pain is worse with any specific motion of the thumb but not necessarily with the wrist.  She has been trying ibuprofen, ice, and he with minimal relief.  Has also been using topical diclofenac with minimal relief.  She denies any history of gout.  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    Review of Systems:  Positive ROS discussed in HPI, otherwise all other systems negative.      Medications:   baclofen (LIORESAL) 5 mg Oral Tablet, Take 1 Tablet (5 mg total) by mouth Three times a day as needed  buPROPion (WELLBUTRIN XL) 450 mg extended release 24 hr tablet, Take 1 Tablet (450 mg total) by mouth Once a day  cetirizine (ZYRTEC) 10 mg Oral Tablet, Take 1 Tablet (10 mg total) by mouth Once a day  ciprofloxacin HCl (CIPRO) 500 mg Oral Tablet, Take 1 Tablet (500 mg total) by mouth Twice daily Indications: UTI treatment.  clobetasoL (TEMOVATE) 0.05 % Ointment, Apply topically Twice daily  diclofenac sodium (VOLTAREN) 1 % Gel, Apply topically Four times a day - before meals and bedtime  FLUoxetine 20 mg Oral Tablet, Take 3 tablets daily (60 mg total)  fluticasone propionate (FLONASE) 50 mcg/actuation Nasal Spray, Suspension, ADMINISTER 2 SPRAYS INTO EACH NOSTRIL ONCE A DAY.  gabapentin (NEURONTIN) 600 mg Oral Tablet, TAKE 1 TABLET (600 MG TOTAL) BY MOUTH TWICE DAILY  loratadine (CLARITIN) 10 mg Oral Tablet, Take 1 Tablet (10 mg  total) by mouth Once a day  nitroGLYCERIN (NITROSTAT) 0.4 mg Sublingual Tablet, Sublingual, 1 Tab (0.4 mg total) by Sublingual route Every 5 minutes as needed for Chest pain for up to 3 doses for 3 doses over 15 minutes  pantoprazole (PROTONIX) 40 mg Oral Tablet, Delayed Release (E.C.), Take 1 Tablet (40 mg total) by mouth Once a day  Azelaic Acid 20 % Cream, Apply topically Twice daily    No facility-administered medications prior to visit.      Allergies:   Allergies   Allergen Reactions    Cymbalta [Duloxetine]      Rash on feet and hands    Blue Dye      Told to avoid due to patch test result by Derm Graham    Flavoring Agent  Other Adverse Reaction (Add comment)     Patient unsure of reaction    Lipitor [Atorvastatin] Myalgia     Muscle pain    Nickel      Blisters  "Allergic to all kinds of metal"     Codeine Itching     RASH    Mobic [Meloxicam] Nausea/ Vomiting     Nervous and shakes     Naprosyn [Naproxen] Nausea/ Vomiting    Nsaids (Non-Steroidal Anti-Inflammatory Drug) Nausea/ Vomiting  OBJECTIVE  BP 110/70   Pulse 66   Temp 36.7 C (98.1 F)   Wt 96.2 kg (212 lb 1.3 oz)   SpO2 98%   BMI 35.29 kg/m       General: no distress  HENT:  Atraumatic, normocephalic  Lungs: clear to auscultation bilaterally  Cardiovascular: RRR, no murmur  Abdomen:  Nondistended  MSK:  Mild edema of the right forearm without any other gross abnormality.  Neurovascularly intact in the right hand.  Exam severely limited as far as range of motion secondary to pain.  Patient unable tolerate passive range of motion of the right thumb  Extremities: no cyanosis  Skin: warm and dry, no rash  Neurologic: gait is normal, AOx3, CN 2-12 grossly intact  Psychiatric: normal affect and behavior    ASSESSMENT/PLAN  (M79.644) Pain of right thumb  (primary encounter diagnosis)  Plan: XR FINGER, 1ST/THUMB RIGHT, XR WRIST RIGHT,         predniSONE (DELTASONE) 10 mg Oral Tablet  Acute, uncertain etiology  -differential is broad but includes  gout, neuropathy, fracture, arthritis  -will get an x-ray of the right thumb and wrist   -start prednisone 40 milligrams daily x3 days, then 30 milligrams daily x3 days, then 20 milligrams daily x3 days, then 10 milligrams daily x3 days  -recommended getting a thumb spica brace  -pending x-rays will consider referral to ortho hand    Orders Placed This Encounter    XR FINGER, 1ST/THUMB RIGHT    XR WRIST RIGHT    predniSONE (DELTASONE) 10 mg Oral Tablet         Return if symptoms worsen or fail to improve.      Dola Argyle, MD 08/27/2022, 16:05

## 2022-08-29 DIAGNOSIS — M19041 Primary osteoarthritis, right hand: Secondary | ICD-10-CM

## 2022-08-29 DIAGNOSIS — M1811 Unilateral primary osteoarthritis of first carpometacarpal joint, right hand: Secondary | ICD-10-CM

## 2022-08-29 DIAGNOSIS — M19031 Primary osteoarthritis, right wrist: Secondary | ICD-10-CM

## 2022-08-31 ENCOUNTER — Ambulatory Visit
Admission: RE | Admit: 2022-08-31 | Discharge: 2022-08-31 | Disposition: A | Discharge status: Home / Self Care | Payer: Medicare Other | Source: Ambulatory Visit | Attending: NURSE PRACTITIONER | Admitting: NURSE PRACTITIONER

## 2022-08-31 ENCOUNTER — Other Ambulatory Visit: Payer: Self-pay

## 2022-08-31 DIAGNOSIS — M4722 Other spondylosis with radiculopathy, cervical region: Secondary | ICD-10-CM | POA: Insufficient documentation

## 2022-08-31 DIAGNOSIS — M5412 Radiculopathy, cervical region: Secondary | ICD-10-CM

## 2022-08-31 DIAGNOSIS — M2578 Osteophyte, vertebrae: Secondary | ICD-10-CM | POA: Insufficient documentation

## 2022-08-31 DIAGNOSIS — M4802 Spinal stenosis, cervical region: Secondary | ICD-10-CM | POA: Insufficient documentation

## 2022-08-31 MED ORDER — DEXAMETHASONE SODIUM PHOSPHATE (PF) 10 MG/ML INJECTION SOLUTION
INTRAMUSCULAR | Status: AC
Start: 2022-08-31 — End: 2022-08-31
  Filled 2022-08-31: qty 1

## 2022-08-31 MED ORDER — LIDOCAINE (PF) 10 MG/ML (1 %) INJECTION SOLUTION
INTRAMUSCULAR | Status: AC
Start: 2022-08-31 — End: 2022-08-31
  Filled 2022-08-31: qty 30

## 2022-08-31 MED ORDER — BUPIVACAINE (PF) 0.25 % (2.5 MG/ML) INJECTION SOLUTION
INTRAMUSCULAR | Status: AC
Start: 2022-08-31 — End: 2022-08-31
  Filled 2022-08-31: qty 30

## 2022-08-31 NOTE — Discharge Instructions (Signed)
Discharge Instructions following Cervical Nerve Root Block     You may experience temporary numbness or weakness in your arm after the injection. When the numbing medication wears off, this should go away.   You should limit your activity and not drive for the remainder of the day.   You may experience a temporary increase in pain for the first 24-72 hours after the injection. This occurs because the local anesthetics were injected into an area where there is already inflammation. The anti-inflammatory effect from the steroid may take several days to take effect.    If the site of the injection is painful, you may apply an ice pack to the area to reduce the discomfort or take pain medication as directed by your physician.   You may resume all of your medications immediately following the injection, unless directed otherwise by your physician.    Please seek medical attention (PCP or local Emergency Dept.) if you experience fever, chills, redness or swelling at the injection site, increased pain, weakness or sensory changes or, rarely, changes in bladder and/or bowel function.     If it is indicated to call your physician, call [facility] at [phone], and ask the operator to page the Radiology Resident.  When calling after 4:00pm or on weekends, ask the operator to page the Radiology Resident On-Call.    If you have any further questions or concerns related to your post-procedure care, you may contact the Radiology Nurse Monday thru Friday, 7:00am-4:00pm at [phone].

## 2022-09-01 MED ORDER — DEXAMETHASONE SODIUM PHOSPHATE (PF) 10 MG/ML INJECTION SOLUTION
10.0000 mg | Freq: Once | INTRAMUSCULAR | Status: AC
Start: 2022-08-31 — End: 2022-08-31
  Administered 2022-08-31: 10 mg via EPIDURAL

## 2022-09-01 MED ORDER — BUPIVACAINE (PF) 0.25 % (2.5 MG/ML) INJECTION SOLUTION
1.5000 mL | Freq: Once | INTRAMUSCULAR | Status: AC
Start: 2022-08-31 — End: 2022-08-31
  Administered 2022-08-31: 1.5 mL via EPIDURAL

## 2022-09-01 MED ORDER — LIDOCAINE HCL 10 MG/ML (1 %) INJECTION SOLUTION
6.0000 mL | Freq: Once | INTRAMUSCULAR | Status: AC
Start: 2022-08-31 — End: 2022-08-31
  Administered 2022-08-31: 60 mg

## 2022-09-01 MED ORDER — IOPAMIDOL 200 MG IODINE/ML (41 %) INTRATHECAL SOLUTION
1.0000 mL | INTRATHECAL | Status: AC
Start: 2022-08-31 — End: 2022-08-31
  Administered 2022-08-31: 1 mL via EPIDURAL

## 2022-09-07 ENCOUNTER — Ambulatory Visit: Payer: Medicare Other | Attending: Anesthesiology | Admitting: Anesthesiology

## 2022-09-07 ENCOUNTER — Encounter (INDEPENDENT_AMBULATORY_CARE_PROVIDER_SITE_OTHER): Payer: Self-pay | Admitting: Anesthesiology

## 2022-09-07 ENCOUNTER — Other Ambulatory Visit: Payer: Self-pay

## 2022-09-07 VITALS — BP 126/80 | HR 83 | Temp 96.8°F | Ht 65.0 in | Wt 214.3 lb

## 2022-09-07 DIAGNOSIS — M79604 Pain in right leg: Secondary | ICD-10-CM | POA: Insufficient documentation

## 2022-09-07 DIAGNOSIS — M4802 Spinal stenosis, cervical region: Secondary | ICD-10-CM | POA: Insufficient documentation

## 2022-09-07 DIAGNOSIS — M47816 Spondylosis without myelopathy or radiculopathy, lumbar region: Secondary | ICD-10-CM | POA: Insufficient documentation

## 2022-09-07 DIAGNOSIS — M545 Low back pain, unspecified: Secondary | ICD-10-CM | POA: Insufficient documentation

## 2022-09-07 DIAGNOSIS — M5090 Cervical disc disorder, unspecified, unspecified cervical region: Secondary | ICD-10-CM | POA: Insufficient documentation

## 2022-09-07 DIAGNOSIS — M47812 Spondylosis without myelopathy or radiculopathy, cervical region: Secondary | ICD-10-CM | POA: Insufficient documentation

## 2022-09-07 DIAGNOSIS — M5412 Radiculopathy, cervical region: Secondary | ICD-10-CM | POA: Insufficient documentation

## 2022-09-07 DIAGNOSIS — R200 Anesthesia of skin: Secondary | ICD-10-CM | POA: Insufficient documentation

## 2022-09-07 DIAGNOSIS — G5641 Causalgia of right upper limb: Secondary | ICD-10-CM | POA: Insufficient documentation

## 2022-09-07 DIAGNOSIS — M4722 Other spondylosis with radiculopathy, cervical region: Secondary | ICD-10-CM

## 2022-09-07 NOTE — Progress Notes (Signed)
I personally saw and evaluated the patient. See mid-level's note for additional details. My findings/participation are : pt had a right C6 SNRB, 4 days ago which has resolved her right arm pain completely. She does have some mid scapular pain. Also c/o symptoms consistent with neurogenic claudication. FU in 6 weeks with lumbar flex/ex films and a lumbar MRI scan    Debbora Dus, MD

## 2022-09-07 NOTE — Progress Notes (Signed)
[facility]  Operated by [place]  Progress Note    Name: [name] MRN:  [id number]   Date: 09/07/2022 Age: 66 y.o.       Referring Provider:   No referring provider defined for this encounter.          Subjective:   [name] is a 66 year old right handed female s/p Right C6 transformational nerve block   She reports pain 7/10. She reports the injection helped reduce right arm and neck pain. She has some left scapular pain..Reports neurogenic claudication with history of lumbar spondylosis.   No falls or bb incontinence.     Current Outpatient Medications   Medication Sig    baclofen (LIORESAL) 5 mg Oral Tablet Take 1 Tablet (5 mg total) by mouth Three times a day as needed    buPROPion (WELLBUTRIN XL) 450 mg extended release 24 hr tablet Take 1 Tablet (450 mg total) by mouth Once a day    cetirizine (ZYRTEC) 10 mg Oral Tablet Take 1 Tablet (10 mg total) by mouth Once a day    ciprofloxacin HCl (CIPRO) 500 mg Oral Tablet Take 1 Tablet (500 mg total) by mouth Twice daily Indications: UTI treatment.    clobetasoL (TEMOVATE) 0.05 % Ointment Apply topically Twice daily    diclofenac sodium (VOLTAREN) 1 % Gel Apply topically Four times a day - before meals and bedtime    FLUoxetine 20 mg Oral Tablet Take 3 tablets daily (60 mg total)    fluticasone propionate (FLONASE) 50 mcg/actuation Nasal Spray, Suspension ADMINISTER 2 SPRAYS INTO EACH NOSTRIL ONCE A DAY.    gabapentin (NEURONTIN) 600 mg Oral Tablet TAKE 1 TABLET (600 MG TOTAL) BY MOUTH TWICE DAILY    loratadine (CLARITIN) 10 mg Oral Tablet Take 1 Tablet (10 mg total) by mouth Once a day    nitroGLYCERIN (NITROSTAT) 0.4 mg Sublingual Tablet, Sublingual 1 Tab (0.4 mg total) by Sublingual route Every 5 minutes as needed for Chest pain for up to 3 doses for 3 doses over 15 minutes    pantoprazole (PROTONIX) 40 mg Oral Tablet, Delayed Release (E.C.) Take 1 Tablet (40 mg total) by mouth Once a day     predniSONE (DELTASONE) 10 mg Oral Tablet Take 4 Tablets (40 mg total) by mouth Once a day for 3 days, THEN 3 Tablets (30 mg total) Once a day for 3 days, THEN 2 Tablets (20 mg total) Once a day for 3 days, THEN 1 Tablet (10 mg total) Once a day for 3 days.        Objective:   Vital Signs:  BP 126/80   Pulse 83   Temp 36 C (96.8 F)   Ht 1.651 m ('5\' 5"'$ )   Wt 97.2 kg (214 lb 4.6 oz)   BMI 35.66 kg/m       Constitutional  General appearance: In no acute distress   HEENT:  Heent:  normocephalic   Musculoskeletal  Gait and Station: : Seated   Muscle strength (upper extremities): : Normal  Muscle strength (lower extremities): : Normal  Muscle tone (upper extremities): : Normal  Muscle tone (lower extremities): : Normal  Sensation: Normal  Deep tendon reflexes upper and lower extremities: Normal  Hoffman's reflex: Left: negative Right: negative  Musculoskeletal tenderness: negative    Neurological  Orientation: Normal  Recent and remote memory: Normal  Attention span and concentration: Normal   Language: Normal  Fund of knowledge: Normal  Data reviewed  06/19/22 MRI C [place] PACS:  IMPRESSION:  Mild cervical spondylosis relatively more focal at the mid cervical spine at the C5-C6 and C6-C7 levels. Findings are similar to the prior MRI examination.    02/23/21 MRI lumbar [place] PACS:  IMPRESSION:  Mild lumbar spondylosis without any spinal or foraminal stenosis.    Discussions with other providers:   Reviewed chart notes     Assessment:    Assessment/Plan   1. Cervical radiculopathy    2. Cervical spondylosis    3. Facial numbness    4. Cervical spinal stenosis    5. Complex regional pain syndrome type 2 of right upper extremity    6. Cervical neck pain with evidence of disc disease    7. Lumbar pain    8. Lumbar pain with radiation down right leg    9. Lumbar spondylosis      Orders Placed This Encounter    CANCELED: XR SPINE SCOLIOSIS PA AND LAT    CANCELED: XR LUMBOSACRAL SPINE FLEX EXT ONLY    MRI SPINE LUMBOSACRAL WO  CONTRAST    XR LUMBOSACRAL SPINE FLEX EXT ONLY    CANCELED: XR SPINE SCOLIOSIS PA AND LAT      Recommendations:  [name] is a 66 year old right handed female s/p Right C6 transformational nerve block:    - Return in 1 month with lumbar MRI and flex ex XR     Cheral Marker, APRN,FNP-BC  As a shared visit with Dr. Margart Sickles

## 2022-09-08 ENCOUNTER — Other Ambulatory Visit: Payer: Self-pay

## 2022-09-08 MED ORDER — OFLOXACIN 0.3 % EYE DROPS
OPHTHALMIC | 0 refills | Status: DC
Start: 2022-09-08 — End: 2023-02-01
  Filled 2022-09-08: qty 5, 5d supply, fill #0

## 2022-09-08 MED ORDER — PREDNISOLONE ACETATE 1 % EYE DROPS,SUSPENSION
OPHTHALMIC | 6 refills | Status: DC
Start: 2022-09-08 — End: 2023-02-01
  Filled 2022-09-08: qty 5, 25d supply, fill #0

## 2022-09-13 ENCOUNTER — Encounter (INDEPENDENT_AMBULATORY_CARE_PROVIDER_SITE_OTHER): Payer: Self-pay | Admitting: Ophthalmology

## 2022-09-13 NOTE — Progress Notes (Signed)
PT CALLED AND NEEDED TO R/S HER SURGERY FROM 09/14/2022 TO 12/07/2022 DUE TO BEING ILL.   Kimberla Driskill A. Helene Kelp, [place] 09/13/2022 11:59

## 2022-09-14 ENCOUNTER — Ambulatory Visit (INDEPENDENT_AMBULATORY_CARE_PROVIDER_SITE_OTHER): Payer: Medicare Other | Admitting: Anesthesiology

## 2022-09-14 ENCOUNTER — Other Ambulatory Visit: Payer: Self-pay

## 2022-09-14 ENCOUNTER — Inpatient Hospital Stay (HOSPITAL_BASED_OUTPATIENT_CLINIC_OR_DEPARTMENT_OTHER)
Admission: RE | Admit: 2022-09-14 | Discharge: 2022-09-14 | Disposition: A | Discharge status: Home / Self Care | Payer: Medicare Other | Source: Ambulatory Visit

## 2022-09-14 ENCOUNTER — Ambulatory Visit
Payer: Medicare Other | Attending: Student in an Organized Health Care Education/Training Program | Admitting: Student in an Organized Health Care Education/Training Program

## 2022-09-14 ENCOUNTER — Other Ambulatory Visit (HOSPITAL_BASED_OUTPATIENT_CLINIC_OR_DEPARTMENT_OTHER): Payer: Medicare Other

## 2022-09-14 ENCOUNTER — Encounter (HOSPITAL_BASED_OUTPATIENT_CLINIC_OR_DEPARTMENT_OTHER): Payer: Self-pay | Admitting: Student in an Organized Health Care Education/Training Program

## 2022-09-14 VITALS — BP 124/80 | HR 90 | Temp 97.2°F | Wt 213.2 lb

## 2022-09-14 DIAGNOSIS — R1030 Lower abdominal pain, unspecified: Secondary | ICD-10-CM | POA: Insufficient documentation

## 2022-09-14 DIAGNOSIS — K5792 Diverticulitis of intestine, part unspecified, without perforation or abscess without bleeding: Secondary | ICD-10-CM | POA: Insufficient documentation

## 2022-09-14 DIAGNOSIS — K5909 Other constipation: Secondary | ICD-10-CM

## 2022-09-14 LAB — CBC WITH DIFF
BASOPHIL #: <0.1 10*3/uL (ref <=0.20)
BASOPHIL %: 1 %
EOSINOPHIL #: 0.11 10*3/uL (ref <=0.50)
EOSINOPHIL %: 2 %
HCT: 42 % (ref 34.8–46.0)
HGB: 13.9 g/dL (ref 11.5–16.0)
IMMATURE GRANULOCYTE #: <0.1 10*3/uL (ref <0.10)
IMMATURE GRANULOCYTE %: 0 % (ref 0–1)
LYMPHOCYTE #: 1.78 10*3/uL (ref 1.00–4.80)
LYMPHOCYTE %: 28 %
MCH: 28.5 pg (ref 26.0–32.0)
MCHC: 33.1 g/dL (ref 31.0–35.5)
MCV: 86.1 fL (ref 78.0–100.0)
MONOCYTE #: 0.6 10*3/uL (ref 0.20–1.10)
MONOCYTE %: 9 %
MPV: 9.2 fL (ref 8.7–12.5)
NEUTROPHIL #: 3.8 10*3/uL (ref 1.50–7.70)
NEUTROPHIL %: 60 %
PLATELETS: 388 10*3/uL (ref 150–400)
RBC: 4.88 10*6/uL (ref 3.85–5.22)
RDW-CV: 14.6 % (ref 11.5–15.5)
WBC: 6.4 10*3/uL (ref 3.7–11.0)

## 2022-09-14 LAB — BASIC METABOLIC PANEL
ANION GAP: 8 mmol/L (ref 4–13)
BUN/CREA RATIO: 16 (ref 6–22)
BUN: 16 mg/dL (ref 8–25)
CALCIUM: 9.2 mg/dL (ref 8.6–10.3)
CHLORIDE: 108 mmol/L (ref 96–111)
CO2 TOTAL: 21 mmol/L — ABNORMAL LOW (ref 23–31)
CREATININE: 0.99 mg/dL (ref 0.60–1.05)
ESTIMATED GFR - FEMALE: 63 mL/min/BSA (ref >=60)
GLUCOSE: 93 mg/dL (ref 65–125)
POTASSIUM: 3.8 mmol/L (ref 3.5–5.1)
SODIUM: 137 mmol/L (ref 136–145)

## 2022-09-14 MED ORDER — AMOXICILLIN 875 MG-POTASSIUM CLAVULANATE 125 MG TABLET
1.0000 | ORAL_TABLET | Freq: Three times a day (TID) | ORAL | 0 refills | Status: AC
Start: 2022-09-14 — End: 2022-09-28

## 2022-09-14 NOTE — Progress Notes (Signed)
Department of Family Medicine  Acute Patient Visit    [name]  MRN: [id number]  DOB: [birth year]  Date of Service: 09/14/2022    CHIEF COMPLAINT  Chief Complaint   Patient presents with    Stool Color Change     black stool     Diarrhea     started Saturday     Abdominal Pain     constant 7/10 pain     Nausea    Urinary Urgency     SUBJECTIVE  [name] is a 66 y.o. female who presents to clinic for diarrhea vs constipation for 3 days now with one episode of black stool. Pt was recently seen by urologist who gave pt Cipro for UTI in the early October. Was also seen in clinic on 11/3 and given prednisone taper for thumb arthritis flare. Has history of GERD for which she takes Protonix 40 mg daily that she feels is not controlling her symptoms. Had 3 days over the weekend of clear watery bowel movements followed by one episode of black stool with normal consistency. Abdominal pain radiates across lower abdomen. Hx of appendectomy and removal of "one ovary" in past. Pain is a 6/10 and has NOT been taking aleve or any pain medications.     Review of Systems:  Unless otherwise indicated above in the HPI, a thorough 10-point ROS was negative.      Medications:   cetirizine (ZYRTEC) 10 mg Oral Tablet, Take 1 Tablet (10 mg total) by mouth Once a day  ciprofloxacin HCl (CIPRO) 500 mg Oral Tablet, Take 1 Tablet (500 mg total) by mouth Twice daily Indications: UTI treatment.  clobetasoL (TEMOVATE) 0.05 % Ointment, Apply topically Twice daily  diclofenac sodium (VOLTAREN) 1 % Gel, Apply topically Four times a day - before meals and bedtime  FLUoxetine 20 mg Oral Tablet, Take 3 tablets daily (60 mg total)  fluticasone propionate (FLONASE) 50 mcg/actuation Nasal Spray, Suspension, ADMINISTER 2 SPRAYS INTO EACH NOSTRIL ONCE A DAY.  gabapentin (NEURONTIN) 600 mg Oral Tablet, TAKE 1 TABLET (600 MG TOTAL) BY MOUTH TWICE DAILY  loratadine (CLARITIN) 10 mg Oral Tablet, Take 1 Tablet (10 mg total) by mouth Once a  day  nitroGLYCERIN (NITROSTAT) 0.4 mg Sublingual Tablet, Sublingual, 1 Tab (0.4 mg total) by Sublingual route Every 5 minutes as needed for Chest pain for up to 3 doses for 3 doses over 15 minutes  ofloxacin (OCUFLOX) 0.3 % Ophthalmic Drops, For use in the surgical suite and 4 times a day into right eye as directed  pantoprazole (PROTONIX) 40 mg Oral Tablet, Delayed Release (E.C.), Take 1 Tablet (40 mg total) by mouth Once a day  prednisoLONE acetate (PRED FORTE) 1 % Ophthalmic Drops, Suspension, For use in the surgical suite and 4 times a day into right eye as directed  baclofen (LIORESAL) 5 mg Oral Tablet, Take 1 Tablet (5 mg total) by mouth Three times a day as needed  buPROPion (WELLBUTRIN XL) 450 mg extended release 24 hr tablet, Take 1 Tablet (450 mg total) by mouth Once a day (Patient not taking: Reported on 09/14/2022)    No facility-administered medications prior to visit.      Allergies:   Allergies   Allergen Reactions    Cymbalta [Duloxetine]      Rash on feet and hands    Blue Dye      Told to avoid due to patch test result by Simone Curia    Flavoring Agent  Other Adverse  Reaction (Add comment)     Patient unsure of reaction    Lipitor [Atorvastatin] Myalgia     Muscle pain    Nickel      Blisters  "Allergic to all kinds of metal"     Codeine Itching     RASH    Mobic [Meloxicam] Nausea/ Vomiting     Nervous and shakes     Naprosyn [Naproxen] Nausea/ Vomiting    Nsaids (Non-Steroidal Anti-Inflammatory Drug) Nausea/ Vomiting     OBJECTIVE  BP 124/80   Pulse 90   Temp 36.2 C (97.2 F) (Thermal Scan)   Wt 96.7 kg (213 lb 3 oz)   SpO2 96%   BMI 35.48 kg/m     General: Pleasant, WDWN female in mild acute distress. Clean dressed and well groomed.   Eyes: Conjunctiva clear, pupils equal and round  HENT: AT/NC. External ears normal. Nasal mucosa normal. Oral mucosa moist and pink.  Neck: Supple, symmetrical, trachea midline  Lungs: Normal WOB on RA. Chest rise equal and symmetric  Cardiovascular: Appears  well perfused   Abdomen: Nondistended, diffuse tenderness to palpation,   Extremities: Atraumatic, well-perfused   Skin: Skin warm and dry, No rashes, and No lesions  Neurologic: Alert and oriented. No tremor, no gross deficit   Psychiatric: Normal    ASSESSMENT/PLAN  (R10.30) Lower abdominal pain  (primary encounter diagnosis)  Plan: XR KUB AND UPRIGHT ABDOMEN  - Black stool not tarry, unlikely to be consistent with melena picture. No epigastric tenderness to palpation points away from gastric vs peptic ulcer. No bright red bowel movements, rigidity, or guarding points away from acute abdomen. Will obtain abdomen XR to assess stool burden vs ileus at this time. No concern for appendicitis as she had an appendectomy in past. Will have patient take Protonix 40 mg BID for 2 weeks as GERD not controlled. Prior colonoscopy showed pandiverticulosis, as patient states this feels like episodes of diverticulitis in past will have patient take Augmentin TID for 14 days. Flexeril 5 mg q8 prn (30 tab) in case MSK pain. Will assess Abdomen XR and contact patient this afternoon. CBC for infections workup and monitor Hgb, BMP to assess for electrolyte abnormalities.     Orders Placed This Encounter    XR KUB AND UPRIGHT ABDOMEN    CBC/DIFF    BASIC METABOLIC PANEL    amoxicillin-pot clavulanate (AUGMENTIN) 875-125 mg Oral Tablet     Return in about 2 weeks (around 09/28/2022).    Leward Quan, MD, PGY-3  [facility]  09/14/2022, 10:50    This note was partially generated using MModal Fluency Direct system. There may be some incorrect words, spellings, and punctuation as a result that were not noted in checking the note before saving.       I saw and examined the patient.  I reviewed the resident's note.  I agree with the findings and plan of care as documented in the resident's note.  Any exceptions/additions are edited/noted.    Dairl Ponder, MD, PhD  Assistant Professor  [facility]

## 2022-09-15 ENCOUNTER — Ambulatory Visit (INDEPENDENT_AMBULATORY_CARE_PROVIDER_SITE_OTHER): Payer: Medicare Other | Admitting: Ophthalmology

## 2022-09-22 ENCOUNTER — Ambulatory Visit (INDEPENDENT_AMBULATORY_CARE_PROVIDER_SITE_OTHER): Payer: Self-pay | Admitting: Ophthalmology

## 2022-09-27 ENCOUNTER — Ambulatory Visit
Admission: RE | Admit: 2022-09-27 | Discharge: 2022-09-27 | Disposition: A | Discharge status: Home / Self Care | Payer: Medicare Other | Source: Ambulatory Visit | Attending: NURSE PRACTITIONER | Admitting: NURSE PRACTITIONER

## 2022-09-27 ENCOUNTER — Other Ambulatory Visit: Payer: Self-pay

## 2022-09-27 DIAGNOSIS — M545 Low back pain, unspecified: Secondary | ICD-10-CM | POA: Insufficient documentation

## 2022-09-27 DIAGNOSIS — M5136 Other intervertebral disc degeneration, lumbar region: Secondary | ICD-10-CM

## 2022-09-27 DIAGNOSIS — G5641 Causalgia of right upper limb: Secondary | ICD-10-CM | POA: Insufficient documentation

## 2022-09-28 ENCOUNTER — Ambulatory Visit (HOSPITAL_BASED_OUTPATIENT_CLINIC_OR_DEPARTMENT_OTHER): Payer: Medicare Other | Admitting: Student in an Organized Health Care Education/Training Program

## 2022-10-04 NOTE — Patient Instructions (Addendum)
Medicare Preventive Services  Medicare coverage information Recommendation for YOU   Heart Disease and Diabetes   Lipid profile Every 5 years or more often if at risk for cardiovascular disease  Last Lipid Panel  (Last result in the past 2 years)        Cholesterol   HDL   LDL   Direct LDL   Triglycerides      02/15/22 1013 205   42   137  Comment: <100 mg/dL, Optimal  100-129 mg/dL, Near/Above Optimal  130-159 mg/dL, Borderline High  160-189 mg/dL, High  >=190 mg/dL, Very high     145             Diabetes Screening  yearly for those at risk for diabetes, 2 tests per year for those with prediabetes Last Glucose:      Diabetes Self Management Training or Medical Nutrition Therapy  For those with diabetes, up to 10 hrs initial training within a year, subsequent years up to 2 hrs of follow up training Optional for those with diabetes     Medical Nutrition Therapy Three hours of one-on-one counseling in first year, two hours in subsequent years Optional for those with diabetes, kidney disease   Intensive Behavioral Therapy for Obesity  Face-to-face counseling, first month every week, month 2-6 every other week, month 7-12 every month if continued progress is documented Optional for those with Body Mass Index 30 or higher  Your There is no height or weight on file to calculate BMI.   Tobacco Cessation (Quitting) Counseling   Two attempts per year, max 4 sessions per attempt, up to 8 per year, for those with tobacco-related health condition Optional for those that use tobacco   Cancer Screening   Colorectal screening   For anyone age 61 to 66 or any age if high risk:  Screening Colonoscopy every 10 yrs if low risk,  more frequent if higher risk  OR  Cologuard Stool DNA test once every 3 years OR  Fecal Occult Blood Testing yearly OR  Flexible  Sigmoidoscopy  every 5 yr OR  CT Colonography every 5 yrs   Colonoscopy completed 07/23/19, repeat in 3 years. Scheduled for 04/27/23.   Screening Pap Test Recommended every 3 years  for all women age 15 to 53, or every five years if combined with HPV test (routine screening not needed after total hysterectomy).  Medicare covers every 2 years, up to yearly if high risk.  Screening Pelvic Exam Medicare covers every 2 years, yearly if high risk or childbearing age with abnormal Pap in last 3 yrs. See your schedule below   Screening Mammogram   Recommended every 2 years for women age 35 to 63, or more frequent if you have a higher risk. Selectively recommended for women between 40-49 based on shared decisions about risk. Covered by Medicare up to every year for women age 57 or older Completed 05/01/21.  Ordered today.   Lung Cancer Screening  Annual low dose computed tomography (LDCT scan) is recommended for those age 66-77 who smoked 20 pack-years and are current smokers or quit smoking within past 15 years (one pack-year= smoking one PPD for one year), after counseling by your doctor or nurse clinician about the possible benefits or harms. See your schedule below   Vaccinations   Pneumococcal Vaccine: Recommended routinely age 7+ with one or two separate vaccines based on your risk    Recommended before age 81 if medical conditions with increased risk  Seasonal  Influenza Vaccine: Once every flu season   Hepatitis B Vaccine: 3 doses if risk (including anyone with diabetes or liver disease)  Shingles Vaccine: Two doses at age 72 or older  Diphtheria Tetanus Pertussis Vaccine: ONCE as adult, booster every 10 years     Immunization History   Administered Date(s) Administered    Covid-19 Vaccine,Pfizer-BioNTech,Purple Top,58yr+ 01/11/2020, 02/01/2020, 10/29/2020    DIPTH,PERTUSSIS-ACEL,TETANUS >10 YRS OLD (ADMIN) 06/04/2010    H1n1 Flu Vaccine Injection (Admin) 10/10/2008    INFLUENZA VIRUS VACCINE (ADMIN) 09/11/2001, 07/24/2012, 10/11/2013    Influenza Vaccine, 6 month-adult 09/09/2010, 09/07/2011, 07/24/2012, 10/11/2013, 08/19/2014, 08/28/2015, 06/19/2016, 07/14/2017, 07/20/2018, 07/12/2019,  06/10/2020, 08/07/2021    Influenza Vaccine, 65+ 07/16/2022    Pneumovax 03/29/2013    Shingrix - Zoster Vaccine 04/04/2019, 07/05/2019    Tetanus,Diptheria,Pertussis(BOOSTRIX) 06/04/2010, 03/12/2020        Other Screening   Bone Densitometry   Every 24 months for anyone at risk, including postmenopausal Completed 04/23/09.  Ordered today.   Glaucoma Screening   Yearly if in high risk group such as diabetes, family history, African American age 65+or Hispanic American age [id number]+  See your Eye Care Provider   Hepatitis C Screening recommended ONCE for those born between 1945-1965, or high risk for HCV infection  Completed 12/12/17.     HIV Testing recommended routinely at least ONCE, covered every year for age 6148to 614regardless of risk, and every year for age over 63who ask for the test or higher risk  Yearly or up to 3 times in pregnancy     Completed 12/12/17.   Abdominal Aortic Aneurysm Screening Ultrasound   Once between the age of 613-75with a family history of AAA       Your Personalized Schedule for Preventive Tests     Health Maintenance: Pending and Last Completed         Date Due Completion Date    Medicare Annual Wellness Visit Never done ---    Osteoporosis screening 04/24/2019 04/23/2009    Pneumococcal Vaccination, Age 61+ (2 - PCV) 05/28/2022 03/29/2013    Covid-19 Vaccine (4 - 2023-24 season) 06/25/2022 10/29/2020    Colonoscopy 07/22/2022 07/23/2019    Mammography 05/02/2023 05/01/2021    Adult Tdap-Td (3 - Td or Tdap) 03/12/2030 03/12/2020                Will see you next year for Annual Wellness Visit!  It was nice meeting you!  [place]

## 2022-10-05 ENCOUNTER — Ambulatory Visit
Payer: Medicare Other | Attending: Family Medicine | Admitting: Student in an Organized Health Care Education/Training Program

## 2022-10-05 ENCOUNTER — Encounter (HOSPITAL_BASED_OUTPATIENT_CLINIC_OR_DEPARTMENT_OTHER): Payer: Self-pay | Admitting: Student in an Organized Health Care Education/Training Program

## 2022-10-05 ENCOUNTER — Inpatient Hospital Stay (HOSPITAL_BASED_OUTPATIENT_CLINIC_OR_DEPARTMENT_OTHER)
Admission: RE | Admit: 2022-10-05 | Discharge: 2022-10-05 | Disposition: A | Discharge status: Home / Self Care | Payer: Medicare Other | Source: Ambulatory Visit

## 2022-10-05 ENCOUNTER — Other Ambulatory Visit: Payer: Self-pay

## 2022-10-05 VITALS — BP 102/72 | HR 78 | Temp 96.8°F | Ht 66.0 in | Wt 214.1 lb

## 2022-10-05 DIAGNOSIS — M8588 Other specified disorders of bone density and structure, other site: Secondary | ICD-10-CM

## 2022-10-05 DIAGNOSIS — Z780 Asymptomatic menopausal state: Secondary | ICD-10-CM

## 2022-10-05 DIAGNOSIS — Z1239 Encounter for other screening for malignant neoplasm of breast: Secondary | ICD-10-CM

## 2022-10-05 DIAGNOSIS — G629 Polyneuropathy, unspecified: Secondary | ICD-10-CM

## 2022-10-05 DIAGNOSIS — Z13820 Encounter for screening for osteoporosis: Secondary | ICD-10-CM | POA: Insufficient documentation

## 2022-10-05 DIAGNOSIS — F332 Major depressive disorder, recurrent severe without psychotic features: Secondary | ICD-10-CM | POA: Insufficient documentation

## 2022-10-05 DIAGNOSIS — Z87891 Personal history of nicotine dependence: Secondary | ICD-10-CM | POA: Insufficient documentation

## 2022-10-05 DIAGNOSIS — Z0000 Encounter for general adult medical examination without abnormal findings: Secondary | ICD-10-CM

## 2022-10-05 MED ORDER — GABAPENTIN 800 MG TABLET
800.0000 mg | ORAL_TABLET | Freq: Two times a day (BID) | ORAL | 1 refills | Status: DC
Start: 2022-10-05 — End: 2023-01-17

## 2022-10-05 NOTE — Progress Notes (Signed)
[facility] [place] [address]  Operated by [facility]  Medicare Annual Wellness Visit    Name: [name] MRN:  [id number]   Date: 10/05/2022 Age: 66 y.o.       SUBJECTIVE:   [name] is a 66 y.o. female for presenting for Medicare Wellness exam.   I have reviewed and reconciled the medication list with the patient today.        10/05/2022     2:24 PM   Comprehensive Health Assessment-Adult   Do you wish to complete this form? Yes   During the past 4 weeks, how would you rate your health in general? Fair   During the past 4 weeks, how much difficulty have you had doing your usual activities inside and outside your home because of medical or emotional problems? Much difficulty   During the past 4 weeks, was someone available to help you if you needed and wanted help? Yes, as much as I wanted   In the past year, how many times have you gone to the emergency department or been admitted to a hospital for a health problem? 1 time   Are you generally satisfied with your sleep? No   Do you have enough money to buy things you need in everyday life, such as food, clothing, medicines, and housing? Sometimes   Can you get to places beyond walking distance without help?  (For example, can you drive your own car or travel alone on buses)? Yes   Do you fasten your seatbelt when you are in a car? Yes, usually   Do you exercise 20 minutes 3 or more days per week (such as walking, dancing, biking, mowing grass, swimming)? No, I usually don't exercise this much   How often do you eat food that is healthy (fruits, vegetables, lean meats) instead of unhealthy (sweets, fast food, junk food, fatty foods)? Almost always   Have your parents, brothers or sisters had any of the following problems before the age of 32? (check all that apply) Heart problems, or hardening of the arteries;Diabetes (sugar);High cholesterol;Cancer;Other family  illness;Mental health problems such as depression, bipolar, severe anxiety, postpartum depression   How often do you have trouble taking medicines the eay you are told to take them? I always take them as prescribed   Do you need any help communicating with your doctors and nurses because of vision or hearing problems? No   During the past 12 months, have you experienced confusion or memory loss that is happening more often or is getting worse? Yes   Do you have one person you think of as your personal doctor (primary care provider or family doctor)? Yes   If you are seeing a Primary Care Provider (PCP) or family doctor. please list their name Alvis Lemmings, PA-C   Are you now also seeing any specialist physician(s) (such as eye doctor, foot doctor, skin doctor)? Yes   If you are seeing a specialist for anything such as foot, eye, skin, etc.  please list their name(s) see list   How confident are you that you can control or manage most of your health problems? Very confident     I have reviewed and updated as appropriate the past medical, family and social history. 10/05/2022 as summarized below:  Past Medical History:   Diagnosis Date    Anxiety     Arthritis     Awareness under anesthesia  woke during colonoscopy    Cancer (CMS [facility])     internal skin cancer    Chest pain 02/19/2016    Chronic pain     lower back    Depression     Dyspnea on exertion     Essential hypertension 02/16/2017    patient denies    GERD (gastroesophageal reflux disease)     controlled w/med    Headache     Hearing loss     Heart murmur 1979    benign    Hyperlipidemia LDL goal < 100 08/29/2013    denies    Migraine     MINOR CAD (coronary artery disease) 11/10/2012    no treatment other than cholesterol medications; follows regularly with K.Dalton, PA    Multinodular goiter     Muscle weakness     neck     Neck problem     Obesity     Peripheral edema     Rash     face only eczema    Rheumatic fever     in 9373 no complications    S/P  thyroidectomy     Sleep apnea     Squamous cell carcinoma 02/06/2015    right side of neck    Thyroid disorder     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    Tinnitus     Vaginal prolapse 2012    surgery improved significantly    Wears glasses      Past Surgical History:   Procedure Laterality Date    Chalazion excision Left 12/22/2021    Colonoscopy  01/06/2009    Gastroscopy  03/05/2010    Gastroscopy with biopsy  03/05/2010    Hx adenoidectomy      Hx ankle fracture tx  2007    Hx appendectomy      Hx cholecystectomy      Hx colonoscopy      Hx cystocele repair  09/24/2009    Hx hand surgery  2012    Hx heart catheterization      Hx hysterectomy      Hx oophorectomy      Hx parotidectomy       Hx partial thyroidectomy Right 04/05/2019    Hx tonsillectomy      Hx total vaginal hysterectomy  1979    Hx wisdom teeth extraction      Hx wrist fracture tx Left 2007    Parathyroid gland surgery      Pars plana vitrectomy Right 11/02/2021    Pb revise ulnar nerve at elbow Left 1979    Pb upper gi endoscopy,biopsy  12/19/2007    Septoplasty       Current Outpatient Medications   Medication Sig    cetirizine (ZYRTEC) 10 mg Oral Tablet Take 1 Tablet (10 mg total) by mouth Once a day    ciprofloxacin HCl (CIPRO) 500 mg Oral Tablet Take 1 Tablet (500 mg total) by mouth Twice daily Indications: UTI treatment.    clobetasoL (TEMOVATE) 0.05 % Ointment Apply topically Twice daily    diclofenac sodium (VOLTAREN) 1 % Gel Apply topically Four times a day - before meals and bedtime    FLUoxetine 20 mg Oral Tablet Take 3 tablets daily (60 mg total)    fluticasone propionate (FLONASE) 50 mcg/actuation Nasal Spray, Suspension ADMINISTER 2 SPRAYS INTO EACH NOSTRIL ONCE A DAY.    gabapentin (NEURONTIN) 600 mg Oral Tablet TAKE 1 TABLET (600 MG TOTAL) BY MOUTH TWICE DAILY  loratadine (CLARITIN) 10 mg Oral Tablet Take 1 Tablet (10 mg total) by mouth Once a day    nitroGLYCERIN (NITROSTAT) 0.4 mg Sublingual Tablet, Sublingual 1 Tab  (0.4 mg total) by Sublingual route Every 5 minutes as needed for Chest pain for up to 3 doses for 3 doses over 15 minutes    ofloxacin (OCUFLOX) 0.3 % Ophthalmic Drops For use in the surgical suite and 4 times a day into right eye as directed    pantoprazole (PROTONIX) 40 mg Oral Tablet, Delayed Release (E.C.) Take 1 Tablet (40 mg total) by mouth Once a day    prednisoLONE acetate (PRED FORTE) 1 % Ophthalmic Drops, Suspension For use in the surgical suite and 4 times a day into right eye as directed     Family Medical History:       Problem Relation (Age of Onset)    Bipolar Disorder Daughter, Paternal Uncle    Breast Cancer Paternal Aunt, Paternal Aunt, Paternal 55, Paternal 7, Paternal Aunt, Other    Cancer Paternal Aunt, Paternal Aunt, Paternal 52, Paternal 33, Paternal Aunt, Other (71)    Congestive Heart Failure Father (59)    Coronary Artery Disease Father, Mother (102)    Diabetes Mother    Heart Attack Father, Sister    High Cholesterol Mother    Hypertension (High Blood Pressure) Mother, Brother, Sister, Sister    Kidney Disease Sister    Leukemia Paternal Uncle    Stroke Paternal Grandmother    Thyroid Disease Sister            Social History     Socioeconomic History    Marital status: Divorced    Number of children: 3   Occupational History    Occupation: babysits Scientist, 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      Social Determinants of Health     Health Literacy: Unknown (07/27/2022)    Health  Literacy     SDOH Health Literacy: Patient chooses not to answer.         List of Current Health Care Providers   Care Team       PCP       Name Type Specialty Phone Number    Alvis Lemmings, [place] Physician [place] [phone]              Care Team       Name Type Specialty Phone Number    Jolly Mango, MD Physician FAMILY PRACTICE [phone]    Con Memos, MD Physician OPHTHALMOLOGY [phone]    Woodfin Ganja, MD Physician UROLOGY [phone]    Debbora Dus, MD Physician NEUROLOGICAL SURGERY [phone]    Allena Katz, MD Physician PSYCHIATRY [phone]  Health Maintenance   Topic Date Due    Medicare Annual Wellness Visit  Never done    Osteoporosis screening  04/24/2019    Pneumococcal Vaccination, Age 28+ (2 - PCV) 05/28/2022    Covid-19 Vaccine (4 - 2023-24 season) 06/25/2022    Colonoscopy  07/22/2022    Mammography  05/02/2023    Adult Tdap-Td (3 - Td or Tdap) 03/12/2030    Hepatitis C screening  Completed    HIV Screening  Completed    Influenza Vaccine  Completed    Shingles Vaccine  Completed    Meningococcal Vaccine  Aged Out    CT Lung Cancer Screening  Discontinued     Medicare Wellness Assessment   Medicare initial or wellness physical in the last year?: No  Advance Directives   Does patient have a living will or MPOA: no   Has patient provided [employer] with a copy?: no   Advance directive information given to the patient today?: no      Activities of Daily Living   Do you need help with dressing, bathing, or walking?: No   Do you need help with shopping, housekeeping, medications, or finances?: No   Do you have rugs in hallways, broken steps, or poor lighting?: Yes   Do you have grab bars in your bathroom, non-slip strips in your tub, and hand rails on your stairs?: Yes   Urinary Incontinence Screen   Do you ever leak urine when you don't want to?: YES   Cognitive Function Screen (1=Yes, 0=No)   What is you age?:  Correct   What is the time to the nearest hour?: Correct   What is the year?: Correct   What is the name of this clinic?: Correct   Can the patient recognize two persons (the doctor, the nurse, home help, etc.)?: Correct   What is the date of your birth? (day and month sufficient) : Correct   In what year did World War II end?: Correct   Who is the current president of the [place]?: Correct   Count from 20 down to 1?: Correct   What address did I give you earlier?: Incorrect   Total Score: 9   Interpretation of Total Score: Greater than 6 Normal   Hearing Screen   Have you noticed any hearing difficulties?: Yes  After whispering 9-1-6 how many numbers did the patient repeat correctly?: 3   Fall Risk Screen   Do you feel unsteady when standing or walking?: No  Do you worry about falling?: No  Have you fallen in the past year?: No   Vision Screen   Right Eye = 20: 80 (wears glasses)   Left Eye = 20: 20 (wears glasses)   Depression Screen     Little interest or pleasure in doing things.: (P) More than half the days  Feeling down, depressed, or hopeless: (P) Several Days  PHQ 2 Total: (P) 3  Trouble falling or staying asleep, or sleeping too much.: (P) More than half the days  Feeling tired or having little energy: (P) Nearly every day  Poor appetite or overeating: (P) More than half the days  Feeling bad about yourself/ that you are a failure in the past 2 weeks?: (P) Several Days  Trouble concentrating on things in the past 2 weeks?: (P) Several Days  Moving/Speaking slowly or being fidgety or restless  in the past 2 weeks?: (P) Not at all  Thoughts that you would be better off DEAD,  or of hurting yourself in some way.: (P) Not at all  PHQ 9 Total: (P) 12     Pain Score   Pain Score:   8 (chronic back pain)    Substance Use-Abuse Screening     Tobacco Use     In Past 12 MONTHS, how often have you used any tobacco product (for example, cigarettes, e-cigarettes, cigars, pipes, or smokeless tobacco)?: Never      Alcohol use     In the PAST 12 MONTHS, how often have you had 5 (men)/4 (women) or more drinks containing alcohol in one day?: Never     Prescription Drug Use     In the PAST 12 months, how often have you used any prescription medications just for the feeling, more than prescribed, or that were not prescribed for you? Prescriptions may include: opioids, benzodiazepines, medications for ADHD: Never           Illicit Drug Use   In the PAST 12 MONTHS, how often have you used any drugs, including marijuana, cocaine or crack, heroin, methamphetamine, hallucinogens, ecstasy/MDMA?: Never           OBJECTIVE:   BP 102/72 (Site: Left, Patient Position: Sitting, Cuff Size: Adult)   Pulse 78   Temp 36 C (96.8 F) (Thermal Scan)   Ht 1.676 m ('5\' 6"'$ )   Wt 97.1 kg (214 lb 1.1 oz)   SpO2 97%   BMI 34.55 kg/m       Other appropriate exam:    Health Maintenance Due   Topic Date Due    Medicare Annual Wellness Visit  Never done    Osteoporosis screening  04/24/2019    Pneumococcal Vaccination, Age 74+ (2 - PCV) 05/28/2022    Covid-19 Vaccine (4 - 2023-24 season) 06/25/2022    Colonoscopy  07/22/2022      ASSESSMENT & PLAN:   Assessment/Plan   1. Medicare welcome visit    2. Neuropathy (CMS [facility])    3. Severe episode of recurrent major depressive disorder, 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CMS [place])    4. Screening for osteoporosis    5. Encounter for screening for malignant neoplasm of breast, unspecified screening modality       Identified Risk Factors/ Recommended Actions     Urinary Incontinence Plan of Care: Behavioral interventions with bladder training, pelvic floor muscle training, and/or prompted voiding    No orders of the defined types were placed in this encounter.         The patient has been educated about risk factors and recommended preventive care. Written Prevention Plan completed/ updated and given to patient (see After Visit Summary).    No follow-ups on file.    Leward Quan, MD      I saw and examined the  patient. I reviewed the resident's note.  I agree with the findings and plan of care as documented in the resident's note. Any exceptions/additions are edited/noted.    Andris Flurry, MD 10/20/2022, 12:50

## 2022-10-06 ENCOUNTER — Encounter (HOSPITAL_BASED_OUTPATIENT_CLINIC_OR_DEPARTMENT_OTHER): Payer: Self-pay

## 2022-10-06 ENCOUNTER — Inpatient Hospital Stay
Admission: RE | Admit: 2022-10-06 | Discharge: 2022-10-06 | Disposition: A | Discharge status: Home / Self Care | Payer: Medicare Other | Source: Ambulatory Visit | Attending: Family Medicine | Admitting: Family Medicine

## 2022-10-06 DIAGNOSIS — Z1239 Encounter for other screening for malignant neoplasm of breast: Secondary | ICD-10-CM

## 2022-10-06 DIAGNOSIS — Z0000 Encounter for general adult medical examination without abnormal findings: Secondary | ICD-10-CM | POA: Insufficient documentation

## 2022-10-06 DIAGNOSIS — Z1231 Encounter for screening mammogram for malignant neoplasm of breast: Secondary | ICD-10-CM | POA: Insufficient documentation

## 2022-10-06 NOTE — Nursing Note (Signed)
Patient was referred to [facility] by her provider. Upon speaking with the patient, she quit smoking >15 years ago (2006) and no longer qualifies for insurance coverage of lung screenings. Discussed with patient the option of our reduced cost out of pocket package for LDCT + read for $171.50. She declines to proceed at this time, contact information provided in the event she decides to proceed or has further questions.     Renaee Munda RN, Risk manager Program  [phone]

## 2022-10-07 DIAGNOSIS — Z1231 Encounter for screening mammogram for malignant neoplasm of breast: Secondary | ICD-10-CM

## 2022-10-19 ENCOUNTER — Encounter (HOSPITAL_BASED_OUTPATIENT_CLINIC_OR_DEPARTMENT_OTHER): Payer: Self-pay | Admitting: Student in an Organized Health Care Education/Training Program

## 2022-11-08 ENCOUNTER — Inpatient Hospital Stay (HOSPITAL_COMMUNITY)
Admission: RE | Admit: 2022-11-08 | Discharge: 2022-11-08 | Disposition: A | Discharge status: Home / Self Care | Payer: PRIVATE HEALTH INSURANCE | Source: Ambulatory Visit

## 2022-11-08 ENCOUNTER — Encounter (HOSPITAL_COMMUNITY): Payer: Self-pay

## 2022-11-11 ENCOUNTER — Ambulatory Visit (INDEPENDENT_AMBULATORY_CARE_PROVIDER_SITE_OTHER): Payer: Medicare Other

## 2022-11-11 ENCOUNTER — Encounter (INDEPENDENT_AMBULATORY_CARE_PROVIDER_SITE_OTHER): Payer: Self-pay

## 2022-11-11 ENCOUNTER — Other Ambulatory Visit: Payer: Self-pay

## 2022-11-11 ENCOUNTER — Ambulatory Visit (HOSPITAL_BASED_OUTPATIENT_CLINIC_OR_DEPARTMENT_OTHER): Payer: Self-pay | Admitting: Physician Assistant

## 2022-11-11 VITALS — BP 121/83 | HR 91 | Temp 98.2°F | Resp 18 | Ht 64.57 in | Wt 211.6 lb

## 2022-11-11 DIAGNOSIS — R0789 Other chest pain: Secondary | ICD-10-CM

## 2022-11-11 DIAGNOSIS — R051 Acute cough: Secondary | ICD-10-CM

## 2022-11-11 LAB — POC COVID-19, FLU A/B, RSV RAPID BY PCR (RESULTS)
INFLUENZA VIRUS A, PCR 4PLEX, POC: NEGATIVE
INFLUENZA VIRUS B, PCR 4PLEX, POC: NEGATIVE
RSV, PCR 4PLEX, POC: NEGATIVE
SARS-COV-2, POC: NEGATIVE

## 2022-11-11 MED ORDER — BENZONATATE 200 MG CAPSULE
200.0000 mg | ORAL_CAPSULE | Freq: Three times a day (TID) | ORAL | 0 refills | Status: DC | PRN
Start: 2022-11-11 — End: 2023-02-01

## 2022-11-11 MED ORDER — ALBUTEROL SULFATE HFA 90 MCG/ACTUATION AEROSOL INHALER
2.0000 | INHALATION_SPRAY | RESPIRATORY_TRACT | 0 refills | Status: DC | PRN
Start: 2022-11-11 — End: 2024-06-26

## 2022-11-11 NOTE — Telephone Encounter (Signed)
Recvd incoming call from patient,   She has had a cough since Wed with now chest soreness/pain from the coughing, she denies it being cardiac in nature.   Denies it radiating, denies pain in arm, jaw, back or elsewhere,    Unfortunately, no appts available in family medicine today, pt sounds very congestion and a bad cough, advised her to seek eval at the Urgent care to not delay care.   Patient verbalized understanding.   She will go to [facility]gent care.       Brett Fairy, RN

## 2022-11-11 NOTE — Progress Notes (Signed)
Patient has given verbal permission for the scribe to assist the healthcare provider during the patient's visit.[place], [place] 11/11/2022, 13:29

## 2022-11-11 NOTE — Progress Notes (Signed)
Attending physician: Dr. Chrissie Noa  History of Present Illness: [name] is a 67 y.o. female who presents to the [facility] today with chief complaint of    Chief Complaint              Cough Chest pain when coughing  Cough just started yesterday  Has not tried any OTC meds          67 y.o. female presents to the Urgent Care with c/o chest pain onset 1 day ago. Pt says that her chest pain is brought on by coughing and has persisted since onset. She endorses a Hx of nodules on her lungs, but no Hx of asthma or COPD. She sts that she has no known sick contacts and that she has taken Robitussin to no symptomatic relief. Pt associates cough (dry) and SOB. She denies fever, nasal congestion, and sore throat.     I reviewed and confirmed the patient's past medical history taken by the nurse or medical assistant with the addition of the following:    Review of Systems:    General: no fever  ENT: no nasal congestion, no sore throat  Pulmonary: + chest pain (secondary to cough), + cough (dry), + SOB  All other review of systems were negative.    Past Medical History:    Past Medical History:   Diagnosis Date    Anxiety     Arthritis     Awareness under anesthesia     woke during colonoscopy    Cancer ([facility])     internal skin cancer    Chest pain 02/19/2016    Chronic pain     lower back    Depression     Dyspnea on exertion     GERD (gastroesophageal reflux disease)     controlled w/med    Headache     Hearing loss     Heart murmur 1979    benign    Hyperlipidemia LDL goal < 100 08/29/2013    denies    Migraine     MINOR CAD (coronary artery disease) 11/10/2012    no treatment other than cholesterol medications; follows regularly with K.Dalton, PA    Multinodular goiter     Muscle weakness     neck     Neck problem     spinal stenosis    Obesity     Peripheral edema     Rash     face only eczema    Rheumatic fever     in 2671 no complications    S/P thyroidectomy     Sleep apnea     Squamous cell carcinoma  02/06/2015    right side of neck    Thyroid disorder     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    Tinnitus     Vaginal prolapse 2012    surgery improved significantly    Wears glasses      Past Surgical History:    Past Surgical History:   Procedure Laterality Date    CHALAZION EXCISION Left 12/22/2021    and conj lesion excision from caruncle    COLONOSCOPY  01/06/2009    COLONOSCOPY performed by Murvin Donning, AHMED F at [facility]  03/05/2010    GASTROSCOPY performed by Delrae Alfred, SWATI at [facility]  03/05/2010    GASTROSCOPY WITH BIOPSY performed by Jocelyn Lamer at [facility]  HX ADENOIDECTOMY      HX ANKLE FRACTURE TX  2007    left distal fibula, casted    HX APPENDECTOMY      HX CHOLECYSTECTOMY      HX COLONOSCOPY      HX CYSTOCELE REPAIR  09/24/2009    HX HAND SURGERY  2012    for Carpal tunnel : right side treated    HX HEART CATHETERIZATION      HX HYSTERECTOMY      HX OOPHORECTOMY      left ovary removed    HX PAROTIDECTOMY       "surgical clamp"     HX PARTIAL THYROIDECTOMY Right 04/05/2019    R hemithyroidectomy for nodule, final path follicular adenoma    HX TONSILLECTOMY      HX TOTAL VAGINAL HYSTERECTOMY  1979    HX WISDOM TEETH EXTRACTION      HX WRIST FRACTURE TX Left 2007    [place] injury    PARATHYROID GLAND SURGERY      PARS PLANA VITRECTOMY Right 11/02/2021    Pars plana vitrectomy, nternal limiting membrane (ILM) and epiretinal membrane (ERM) peel, Injection of 20%  sf6 gas, Subtenon's injection of Cefuroxime 50 mg and Dexamethasone 2 mg    PB REVISE ULNAR NERVE AT ELBOW Left 1979    PB UPPER GI ENDOSCOPY,BIOPSY  12/19/2007    patulous GE junction zone, erythema, nonerosive GERD    SEPTOPLASTY       Allergies:  Allergies   Allergen Reactions    Cymbalta [Duloxetine]      Rash on feet and hands    Blue Dye      Told to avoid due to patch test result by [facility]    Flavoring Agent  Other Adverse Reaction (Add comment)     Patient unsure of reaction     Lipitor [Atorvastatin] Myalgia     Muscle pain    Nickel      Blisters  "Allergic to all kinds of metal"     Codeine Itching     RASH    Mobic [Meloxicam] Nausea/ Vomiting     Nervous and shakes     Naprosyn [Naproxen] Nausea/ Vomiting    Nsaids (Non-Steroidal Anti-Inflammatory Drug) Nausea/ Vomiting     Medications:    Current Outpatient Medications   Medication Sig    albuterol sulfate (PROAIR HFA) 90 mcg/actuation Inhalation oral inhaler Take 2 Puffs by inhalation Every 4 hours as needed    Benzonatate (TESSALON) 200 mg Oral Capsule Take 1 Capsule (200 mg total) by mouth Three times a day as needed for Cough    cetirizine (ZYRTEC) 10 mg Oral Tablet Take 1 Tablet (10 mg total) by mouth Once a day (Patient taking differently: Take 1 Tablet (10 mg total) by mouth Once a day Takes in am)    ciprofloxacin HCl (CIPRO) 500 mg Oral Tablet Take 1 Tablet (500 mg total) by mouth Twice daily Indications: UTI treatment. (Patient not taking: Reported on 11/08/2022)    clobetasoL (TEMOVATE) 0.05 % Ointment Apply topically Twice daily    diclofenac sodium (VOLTAREN) 1 % Gel Apply topically Four times a day - before meals and bedtime    FLUoxetine 20 mg Oral Tablet Take 3 tablets daily (60 mg total) (Patient taking differently: Take 3 Tablets (60 mg total) by mouth Once a day Take 3 tablets daily (60 mg total), takes in am)    fluticasone propionate (FLONASE) 50 mcg/actuation Nasal Spray, Suspension ADMINISTER 2  SPRAYS INTO EACH NOSTRIL ONCE A DAY. (Patient taking differently: Administer 2 Sprays into each nostril Once a day Takes in am)    gabapentin (NEURONTIN) 800 mg Oral Tablet Take 1 Tablet (800 mg total) by mouth Twice daily    nitroGLYCERIN (NITROSTAT) 0.4 mg Sublingual Tablet, Sublingual 1 Tab (0.4 mg total) by Sublingual route Every 5 minutes as needed for Chest pain for up to 3 doses for 3 doses over 15 minutes    ofloxacin (OCUFLOX) 0.3 % Ophthalmic Drops For use in the surgical suite and 4 times a day into right eye as  directed (Patient not taking: Reported on 11/11/2022)    pantoprazole (PROTONIX) 40 mg Oral Tablet, Delayed Release (E.C.) Take 1 Tablet (40 mg total) by mouth Once a day (Patient taking differently: Take 1 Tablet (40 mg total) by mouth Once a day Takes in am)    prednisoLONE acetate (PRED FORTE) 1 % Ophthalmic Drops, Suspension For use in the surgical suite and 4 times a day into right eye as directed (Patient not taking: Reported on 11/11/2022)     Social History:    Social History     Tobacco Use    Smoking status: Former     Packs/day: 2.00     Years: 40.00     Additional pack years: 0.00     Total pack years: 80.00     Types: Cigarettes     Quit date: 01/22/2005     Years since quitting: 17.8    Smokeless tobacco: Never   Vaping Use    Vaping Use: Never used   Substance Use Topics    Alcohol use: No     Alcohol/week: 0.0 standard drinks of alcohol    Drug use: No     Family History:  Family Medical History:       Problem Relation (Age of Onset)    Bipolar Disorder Daughter, Paternal Uncle    Breast Cancer Paternal Aunt, Paternal Aunt, Paternal Aunt, Paternal 29, Paternal Aunt, Other    Cancer Paternal Aunt, Paternal 4, Paternal 31, Paternal 20, Paternal 72, Other (32)    Congestive Heart Failure Father (31)    Coronary Artery Disease Father, Mother (51)    Diabetes Mother    Heart Attack Father, Sister    High Cholesterol Mother    Hypertension (High Blood Pressure) Mother, Brother, Sister, Sister    Kidney Disease Sister    Leukemia Paternal Uncle    Stroke Paternal Grandmother    Thyroid Disease Sister          Physical Exam:  Vital signs:   Vitals:    11/11/22 1329   BP: 121/83   Pulse: 91   Resp: 18   Temp: 36.8 C (98.2 F)   TempSrc: Oral   SpO2: 97%   Weight: 96 kg (211 lb 10.3 oz)   Height: 1.64 m (5' 4.57")   BMI: 35.77     Body mass index is 35.69 kg/m. Facility age limit for growth %iles is 20 years.  No LMP recorded. Patient has had a hysterectomy.    General:  Well appearing and No acute  distress  Head:  Normocephalic and atraumatic  Eyes:  Normal lids/lashes, normal conjunctiva  ENT:  normal EAC's, normal TM's, MMM, normal tongue/uvula, normal pharynx/tonsils  Neck:  supple  Pulmonary:  clear to auscultation bilaterally, no wheezes, no rales and no rhonchi  Cardiovascular:  regular rate/rhythm, normal S1/S2, no murmur/rub/gallop  Musculoskeletal:  5/5 strength in all extremities  Skin:  warm/dry and no rash  Psychiatric:  Appropriate affect and behavior and Normal speech  Neurologic:   Alert and oriented x 3    Data Reviewed:    Radiography: PA & Lateral Chest X-ray Reviewed by Frederico Hamman, PA-C on 11/11/2022 at 13:52. No acute cardiopulmonary process.      Results for orders placed or performed in visit on 11/11/22 (from the past 12 hour(s))   POC COVID-19, FLU A/B, RSV RAPID BY PCR (RESULTS)   Result Value Ref Range    SARS-COV-2, POC Negative Negative    INFLUENZA VIRUS A, PCR 4PLEX, POC Negative Negative    INFLUENZA VIRUS B, PCR 4PLEX, POC Negative Negative    RSV, PCR 4PLEX, POC Negative Negative      Course: Condition at discharge: Good     Differential Diagnosis: URI vs COVID-19 vs Bronchitis     Assessment:   Assessment/Plan   1. Acute cough      Plan:    Orders Placed This Encounter    CXR PA & Lateral    PERFORM POC COVID-19, FLU A/B, RSV RAPID BY PCR - CLINIC ONLY    Benzonatate (TESSALON) 200 mg Oral Capsule    albuterol sulfate (PROAIR HFA) 90 mcg/actuation Inhalation oral inhaler     -  Prescriptions given for Tessalon and Proair HFA, given directions on proper use.   - PA & Lateral Chest X-ray was performed in clinic, see data reviewed, discussed results with pt.   - Cepheid 4-Plex by PCR returned negative for COVID-19, negative for Influenza A, negative for Influenza B, and negative for RSV.  - Educated on symptomatic treatment with OTC medications and remedies.  - Recommended supportive care with rest and hydration.   - Advised to follow up as needed for worsening or persisting  symptoms.      Go to Emergency Department immediately for further work up if any concerning symptoms.  Plan was discussed and patient verbalized understanding.  If symptoms are worsening or not improving the patient should return to the [facility] for further evaluation.    I am scribing for, and in the presence of, Frederico Hamman, PA-C for services provided on 11/11/2022.  Gwenevere Ghazi, SCRIBE   Gwenevere Ghazi, SCRIBE 11/11/2022, 14:19      I personally performed the services described in this documentation, as scribed  in my presence, and it is both accurate  and complete.      Supervising physician was physically present and available for consultation and did not participate in the care of this patient.    Frederico Hamman, PA-C 11/11/2022 14:31

## 2022-11-18 ENCOUNTER — Encounter (HOSPITAL_COMMUNITY): Payer: Medicare Other | Admitting: Student in an Organized Health Care Education/Training Program

## 2022-11-29 ENCOUNTER — Inpatient Hospital Stay (HOSPITAL_BASED_OUTPATIENT_CLINIC_OR_DEPARTMENT_OTHER)
Admission: RE | Admit: 2022-11-29 | Discharge: 2022-11-29 | Disposition: A | Discharge status: Home / Self Care | Payer: Medicare Other | Source: Ambulatory Visit

## 2022-11-29 ENCOUNTER — Encounter (HOSPITAL_BASED_OUTPATIENT_CLINIC_OR_DEPARTMENT_OTHER): Payer: Self-pay | Admitting: Physician Assistant

## 2022-11-29 ENCOUNTER — Ambulatory Visit: Payer: Medicare Other | Attending: Physician Assistant | Admitting: Physician Assistant

## 2022-11-29 ENCOUNTER — Other Ambulatory Visit: Payer: Self-pay

## 2022-11-29 VITALS — BP 128/74 | HR 97 | Temp 98.1°F | Wt 214.9 lb

## 2022-11-29 DIAGNOSIS — I700 Atherosclerosis of aorta: Secondary | ICD-10-CM

## 2022-11-29 DIAGNOSIS — M79605 Pain in left leg: Secondary | ICD-10-CM | POA: Insufficient documentation

## 2022-11-29 DIAGNOSIS — Z01818 Encounter for other preprocedural examination: Secondary | ICD-10-CM | POA: Insufficient documentation

## 2022-11-29 DIAGNOSIS — Z9889 Other specified postprocedural states: Secondary | ICD-10-CM

## 2022-11-29 LAB — ECG 12 LEAD - (AMB USE ONLY)(MUSE, IN CLINIC)
Atrial Rate: 59 {beats}/min
Calculated P Axis: 39 degrees
Calculated R Axis: -20 degrees
Calculated T Axis: -1 degrees
PR Interval: 150 ms
QRS Duration: 68 ms
QT Interval: 412 ms
QTC Calculation: 407 ms
Ventricular rate: 59 {beats}/min

## 2022-11-29 MED ORDER — NITROGLYCERIN 0.4 MG SUBLINGUAL TABLET
0.4000 mg | SUBLINGUAL_TABLET | SUBLINGUAL | 2 refills | Status: DC | PRN
Start: 2022-11-29 — End: 2023-01-17

## 2022-11-29 NOTE — Progress Notes (Signed)
[name]  MRN: [id number]  DOB: 08/02/1956  Date: 11/29/2022      CC: Preoperative Clearance    Requesting MD: Dr. Laurance Flatten    HPI:  67 y.o. female here as a consultation visit for preoperative clearance for cataract surgery.    Patient is doing fairly well overall.  She does complain of a chronic cough that has been present since November.  She has been given cough suppressants for it and an albuterol inhaler.  Has tested negative for viral illnesses.  Feels that it is more related to her allergies.  It is nonproductive and seems to be worse at night and in the morning.  Denies any fevers, chills, shortness for breath, nausea, vomiting, or myalgias.      She also complains of left leg pain.  States that it is mildly swollen.  No redness or warmth.  Denies any known injury.  Symptoms have been present for the last few days.  States it feels more like a bruise than anything.        PMH:   Surgeries:  Past Surgical History:   Procedure Laterality Date    CHALAZION EXCISION Left 12/22/2021    and conj lesion excision from caruncle    COLONOSCOPY  01/06/2009    COLONOSCOPY performed by Murvin Donning, AHMED F at [facility]  03/05/2010    GASTROSCOPY performed by Jocelyn Lamer at [facility]  03/05/2010    GASTROSCOPY WITH BIOPSY performed by Jocelyn Lamer at [facility]  2007    left distal fibula, casted    HX APPENDECTOMY      HX CHOLECYSTECTOMY      HX COLONOSCOPY      HX CYSTOCELE REPAIR  09/24/2009    HX HAND SURGERY  2012    for Carpal tunnel : right side treated    HX HEART CATHETERIZATION      HX HYSTERECTOMY      HX OOPHORECTOMY      left ovary removed    HX PAROTIDECTOMY       "surgical clamp"     HX PARTIAL THYROIDECTOMY Right 04/05/2019    R hemithyroidectomy for nodule, final path follicular adenoma    HX TONSILLECTOMY      HX TOTAL VAGINAL HYSTERECTOMY  1979    HX WISDOM TEETH EXTRACTION      HX WRIST FRACTURE TX Left 2007     [place] injury    PARATHYROID GLAND SURGERY      PARS PLANA VITRECTOMY Right 11/02/2021    Pars plana vitrectomy, nternal limiting membrane (ILM) and epiretinal membrane (ERM) peel, Injection of 20%  sf6 gas, Subtenon's injection of Cefuroxime 50 mg and Dexamethasone 2 mg    PB REVISE ULNAR NERVE AT ELBOW Left 1979    PB UPPER GI ENDOSCOPY,BIOPSY  12/19/2007    patulous GE junction zone, erythema, nonerosive GERD    SEPTOPLASTY           Illnesses:   Patient Active Problem List   Diagnosis    Migraine Headaches    GERD not well controlled    Patellofemoral pain syndrome    Multinodular goiter    Microscopic hematuria    Neck pain    Lichen sclerosus et atrophicus of the vulva    Carpal tunnel syndrome on right    Osteoarthritis, knee  Left knee pain    Chest pain at rest    MINOR CAD (coronary artery disease)    Hyperlipidemia LDL goal < 100    Otitis media of both ears    Mass of parotid gland, right    Squamous cell carcinoma    Chest pain    Dysuria    Multiple pulmonary nodules determined by computed tomography of lung    History of rheumatic fever    Neuropathy ([facility])    PTSD (post-traumatic stress disorder)    Pain due to neuropathy of facial nerve    Lung nodule < 6cm on CT    S/P thyroidectomy    GAD (generalized anxiety disorder)    Severe episode of recurrent major depressive disorder, 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)    Right hip pain    Hand pain    Cervical neck pain with evidence of disc disease    Lumbar pain with radiation down right leg     Medications:   Current Outpatient Medications   Medication Sig    albuterol sulfate (PROAIR HFA) 90 mcg/actuation Inhalation oral inhaler Take 2 Puffs by inhalation Every 4 hours as needed    Benzonatate (TESSALON) 200 mg Oral Capsule Take 1 Capsule (200 mg total) by mouth Three times a day as needed for Cough    cetirizine (ZYRTEC) 10 mg Oral Tablet Take 1 Tablet (10 mg total) by mouth Once a day (Patient taking differently: Take 1 Tablet (10 mg total) by  mouth Once a day Takes in am)    ciprofloxacin HCl (CIPRO) 500 mg Oral Tablet Take 1 Tablet (500 mg total) by mouth Twice daily Indications: UTI treatment. (Patient not taking: Reported on 11/08/2022)    clobetasoL (TEMOVATE) 0.05 % Ointment Apply topically Twice daily    diclofenac sodium (VOLTAREN) 1 % Gel Apply topically Four times a day - before meals and bedtime    FLUoxetine 20 mg Oral Tablet Take 3 tablets daily (60 mg total) (Patient taking differently: Take 3 Tablets (60 mg total) by mouth Once a day Take 3 tablets daily (60 mg total), takes in am)    fluticasone propionate (FLONASE) 50 mcg/actuation Nasal Spray, Suspension ADMINISTER 2 SPRAYS INTO EACH NOSTRIL ONCE A DAY. (Patient taking differently: Administer 2 Sprays into each nostril Once a day Takes in am)    gabapentin (NEURONTIN) 800 mg Oral Tablet Take 1 Tablet (800 mg total) by mouth Twice daily    nitroGLYCERIN (NITROSTAT) 0.4 mg Sublingual Tablet, Sublingual 1 Tab (0.4 mg total) by Sublingual route Every 5 minutes as needed for Chest pain for up to 3 doses for 3 doses over 15 minutes    ofloxacin (OCUFLOX) 0.3 % Ophthalmic Drops For use in the surgical suite and 4 times a day into right eye as directed (Patient not taking: Reported on 11/11/2022)    pantoprazole (PROTONIX) 40 mg Oral Tablet, Delayed Release (E.C.) Take 1 Tablet (40 mg total) by mouth Once a day (Patient taking differently: Take 1 Tablet (40 mg total) by mouth Once a day Takes in am)    prednisoLONE acetate (PRED FORTE) 1 % Ophthalmic Drops, Suspension For use in the surgical suite and 4 times a day into right eye as directed (Patient not taking: Reported on 11/11/2022)     Allergies:   Allergies   Allergen Reactions    Cymbalta [Duloxetine]      Rash on feet and hands    Blue Dye      Told  to avoid due to patch test result by [facility]    Flavoring Agent  Other Adverse Reaction (Add comment)     Patient unsure of reaction    Lipitor [Atorvastatin] Myalgia     Muscle pain    Nickel       Blisters  "Allergic to all kinds of metal"     Codeine Itching     RASH    Mobic [Meloxicam] Nausea/ Vomiting     Nervous and shakes     Naprosyn [Naproxen] Nausea/ Vomiting    Nsaids (Non-Steroidal Anti-Inflammatory Drug) Nausea/ Vomiting     Immunizations: Tetanus    Family History:   Family Medical History:       Problem Relation (Age of Onset)    Bipolar Disorder Daughter, Paternal Uncle    Breast Cancer Paternal Aunt, Paternal Aunt, Paternal 68, Paternal 65, Paternal 48, Other    Cancer Paternal 27, Paternal 75, Paternal 92, Paternal 106, Paternal 71, Other (48)    Congestive Heart Failure Father (30)    Coronary Artery Disease Father, Mother (41)    Diabetes Mother    Heart Attack Father, Sister    High Cholesterol Mother    Hypertension (High Blood Pressure) Mother, Brother, Sister, Sister    Kidney Disease Sister    Leukemia Paternal Uncle    Stroke Paternal Grandmother    Thyroid Disease Sister            No history of malignant hyperthermia  No history of anesthesia complications    Social History:   Social History     Tobacco Use    Smoking status: Former     Packs/day: 2.00     Years: 40.00     Additional pack years: 0.00     Total pack years: 80.00     Types: Cigarettes     Quit date: 01/22/2005     Years since quitting: 17.8    Smokeless tobacco: Never   Substance Use Topics    Alcohol use: No     Alcohol/week: 0.0 standard drinks of alcohol     Recreational Drug Use:      ROS:   Bleeding: negative   Chest Pain: negative   Shortness of Breath with Exertion: negative   GERD: controlled  No loose teeth    Functional Capacity:   Can patient perform 4 METS (metabolic energy expenditure unit) without cardiac symptoms. Commonly used items are the ability to walk up 1 flight of stairs holding a bag of groceries or the ability to walk on level ground at 4 miles per hour (or 1 mile in 15 minutes). If patient has good functional capacity (4 METS or higher) an intermediate risk procedure can  proceed without further evaluation. If patient has symptoms or if functional capacity cannot be properly evaluated, the next step is to evaluate clinical risk factors.   Patient can walk up one flight of stairs holding a bag of groceries without experiencing chest pain or pressure.   Patient can walk on level ground at 4 miles per hour without experiencing chest pain or pressure.     Physical Exam:   Vitals:    11/29/22 1311   BP: 128/74   Pulse: 97   Temp: 36.7 C (98.1 F)   TempSrc: Thermal Scan   SpO2: 99%   Weight: 97.5 kg (214 lb 15.2 oz)         Labs/Studies:   Chest X-ray, ECG    ASSESSMENT/PLAN:   1.  Preoperative Consultation for cataract surgery. Patient has one risk factors for major cardiac complications by the Revised Goldman Cardiac Risk Index. This puts him/her at approximately 1.0% risk of a major cardiac complication during the planned procedure. Patient has been informed of this risk, is willing to proceed, and thus is medically cleared for the planned procedure.   2. Reason for surgery: cataract   3. Other issues: CAD, GERD, neuropathy   4. LLE pain: low concern for DVT. Higher suspicion for muscle strain. Venous duplex ordered as a precaution. Encouraged topical muscle rubs, gentle stretching, and heat. ED precautions advised should she develop acute onset of redness, warmth, and worsening swelling/ pain.      Revised Goldman Cardiac Risk Index:  Six independent predictors of major cardiac complications (defined as myocardial infarction, pulmonary edema, ventricular fibrillation or primary cardiac arrest, and complete heart block)  High-risk type of surgery (intraperitoneal, intrathoracic, or suprainguinal vascular surgery)   History of ischemic heart disease (history of MI or a positive exercise test, current complaint of chest pain considered to be secondary to myocardial ischemia, use of nitrate therapy, or ECG with pathological Q waves; do not count prior coronary revascularization procedure  unless one of the other criteria for ischemic heart disease is present)   History of HF   History of cerebrovascular disease   Diabetes mellitus requiring treatment with insulin   Preoperative serum creatinine >2.0 mg/dL (177 mol/L)   These predictors were then validated in a cohort of 1422 patients. The predictive value was significant in all types of elective major noncardiac surgery except for abdominal aortic aneurysm surgery (show figure 1).  The risk of major cardiac complications varied according to the number of risk factors. If one includes only cardiac death, nonfatal MI, and nonfatal cardiac arrest as major cardiac events in the [place], the following rates of adverse outcomes were seen [7]:  No risk factors -- 0.4 percent (95% CI 0.1-0.8 percent)   One risk factor -- 1.0 percent (95% CI 0.5-1.4 percent) (Consider noninvasive cardiac testing)  Two risk factors -- 2.4 percent (95% CI 1.3-3.5 percent) (Consider noninvasive cardiac testing)  Three or more risk factors -- 5.4 percent (95% CI 2.8-7.9 percent)         Alvis Lemmings, PA-C

## 2022-12-01 ENCOUNTER — Other Ambulatory Visit: Payer: Self-pay

## 2022-12-01 MED ORDER — PREDNISOLONE ACETATE 1 % EYE DROPS,SUSPENSION
OPHTHALMIC | 0 refills | Status: DC
Start: 2022-12-01 — End: 2023-02-01
  Filled 2022-12-01: qty 5, 11d supply, fill #0

## 2022-12-01 MED ORDER — OFLOXACIN 0.3 % EYE DROPS
OPHTHALMIC | 0 refills | Status: DC
Start: 2022-12-01 — End: 2023-02-01
  Filled 2022-12-01: qty 5, 11d supply, fill #0

## 2022-12-02 ENCOUNTER — Other Ambulatory Visit: Payer: Self-pay

## 2022-12-02 ENCOUNTER — Inpatient Hospital Stay
Admission: RE | Admit: 2022-12-02 | Discharge: 2022-12-02 | Disposition: A | Discharge status: Home / Self Care | Payer: Medicare Other | Source: Ambulatory Visit | Attending: Physician Assistant

## 2022-12-02 DIAGNOSIS — M79605 Pain in left leg: Secondary | ICD-10-CM | POA: Insufficient documentation

## 2022-12-03 ENCOUNTER — Other Ambulatory Visit: Payer: Self-pay

## 2022-12-07 ENCOUNTER — Other Ambulatory Visit: Payer: Self-pay

## 2022-12-07 ENCOUNTER — Encounter (HOSPITAL_COMMUNITY): Payer: Self-pay | Admitting: Ophthalmology

## 2022-12-07 ENCOUNTER — Encounter (HOSPITAL_COMMUNITY)
Admission: RE | Disposition: A | Discharge status: Home / Self Care | Payer: Self-pay | Source: Ambulatory Visit | Attending: Ophthalmology

## 2022-12-07 ENCOUNTER — Encounter (HOSPITAL_COMMUNITY): Payer: Medicare Other | Admitting: Ophthalmology

## 2022-12-07 ENCOUNTER — Ambulatory Visit (HOSPITAL_BASED_OUTPATIENT_CLINIC_OR_DEPARTMENT_OTHER): Payer: Medicare Other | Admitting: Anesthesiology

## 2022-12-07 ENCOUNTER — Inpatient Hospital Stay
Admission: RE | Admit: 2022-12-07 | Discharge: 2022-12-07 | Disposition: A | Discharge status: Home / Self Care | Payer: Medicare Other | Source: Ambulatory Visit | Attending: Ophthalmology | Admitting: Ophthalmology

## 2022-12-07 ENCOUNTER — Ambulatory Visit (HOSPITAL_COMMUNITY): Payer: Medicare Other | Admitting: Anesthesiology

## 2022-12-07 DIAGNOSIS — I2510 Atherosclerotic heart disease of native coronary artery without angina pectoris: Secondary | ICD-10-CM | POA: Insufficient documentation

## 2022-12-07 DIAGNOSIS — E669 Obesity, unspecified: Secondary | ICD-10-CM | POA: Insufficient documentation

## 2022-12-07 DIAGNOSIS — F419 Anxiety disorder, unspecified: Secondary | ICD-10-CM | POA: Insufficient documentation

## 2022-12-07 DIAGNOSIS — E785 Hyperlipidemia, unspecified: Secondary | ICD-10-CM | POA: Insufficient documentation

## 2022-12-07 DIAGNOSIS — H2511 Age-related nuclear cataract, right eye: Secondary | ICD-10-CM

## 2022-12-07 DIAGNOSIS — Z79899 Other long term (current) drug therapy: Secondary | ICD-10-CM | POA: Insufficient documentation

## 2022-12-07 DIAGNOSIS — G4730 Sleep apnea, unspecified: Secondary | ICD-10-CM | POA: Insufficient documentation

## 2022-12-07 DIAGNOSIS — K219 Gastro-esophageal reflux disease without esophagitis: Secondary | ICD-10-CM | POA: Insufficient documentation

## 2022-12-07 DIAGNOSIS — Z87891 Personal history of nicotine dependence: Secondary | ICD-10-CM | POA: Insufficient documentation

## 2022-12-07 DIAGNOSIS — F32A Depression, unspecified: Secondary | ICD-10-CM | POA: Insufficient documentation

## 2022-12-07 DIAGNOSIS — R519 Headache, unspecified: Secondary | ICD-10-CM | POA: Insufficient documentation

## 2022-12-07 DIAGNOSIS — Z6835 Body mass index (BMI) 35.0-35.9, adult: Secondary | ICD-10-CM | POA: Insufficient documentation

## 2022-12-07 HISTORY — PX: CATARACT EXTRACTION: SUR2

## 2022-12-07 SURGERY — PHACO WITH INTRAOCULAR LENS
Anesthesia: Monitor Anesthesia Care | Site: Eye | Laterality: Right | Wound class: Clean Wound: Uninfected operative wounds in which no inflammation occurred

## 2022-12-07 MED ORDER — SODIUM CHLORIDE 0.9 % (FLUSH) INJECTION SYRINGE
2.0000 mL | INJECTION | Freq: Three times a day (TID) | INTRAMUSCULAR | Status: DC
Start: 2022-12-07 — End: 2022-12-07

## 2022-12-07 MED ORDER — FENTANYL (PF) 50 MCG/ML INJECTION SOLUTION
12.5000 ug | INTRAMUSCULAR | Status: DC | PRN
Start: 2022-12-07 — End: 2022-12-07

## 2022-12-07 MED ORDER — FENTANYL (PF) 50 MCG/ML INJECTION SOLUTION
25.0000 ug | INTRAMUSCULAR | Status: DC | PRN
Start: 2022-12-07 — End: 2022-12-07

## 2022-12-07 MED ORDER — CYCLOPENT 1 %-TROPICAMIDE 1 %-PROPARACAINE 0.1 %-PE-KETOROLAC EYE DROP
1.0000 [drp] | OPHTHALMIC | Status: AC
Start: 2022-12-07 — End: 2022-12-07
  Administered 2022-12-07 (×3): 1 [drp] via OPHTHALMIC
  Filled 2022-12-07: qty 1

## 2022-12-07 MED ORDER — CEFUROXIME 1 MG/0.1 ML OPHTH INJ
1.0000 mg | INJECTION | Freq: Once | INTRACAMERAL | Status: DC | PRN
Start: 2022-12-07 — End: 2022-12-07
  Administered 2022-12-07: 1 mg via INTRAMUSCULAR
  Filled 2022-12-07: qty 0.1

## 2022-12-07 MED ORDER — PREDNISOLONE ACETATE 1 % EYE DROPS,SUSPENSION
1.0000 [drp] | Freq: Once | OPHTHALMIC | Status: DC | PRN
Start: 2022-12-07 — End: 2022-12-07

## 2022-12-07 MED ORDER — SODIUM CHLORIDE 0.9% FLUSH BAG - 250 ML
INTRAVENOUS | Status: DC | PRN
Start: 2022-12-07 — End: 2022-12-07

## 2022-12-07 MED ORDER — MIDAZOLAM (PF) 1 MG/ML INJECTION SOLUTION
Freq: Once | INTRAMUSCULAR | Status: DC | PRN
Start: 2022-12-07 — End: 2022-12-07
  Administered 2022-12-07 (×2): 1 mg via INTRAVENOUS

## 2022-12-07 MED ORDER — SODIUM CHLORIDE 0.9 % (FLUSH) INJECTION SYRINGE
2.0000 mL | INJECTION | INTRAMUSCULAR | Status: DC | PRN
Start: 2022-12-07 — End: 2022-12-07

## 2022-12-07 MED ORDER — DEXTROSE 5% IN WATER (D5W) FLUSH BAG - 250 ML
INTRAVENOUS | Status: DC | PRN
Start: 2022-12-07 — End: 2022-12-07

## 2022-12-07 MED ORDER — BALANCED SALT SOLUTION COMBINATION NO.2 INTRAOCULAR IRRIGATION
15.0000 mL | Freq: Once | INTRAOCULAR | Status: DC | PRN
Start: 2022-12-07 — End: 2022-12-07
  Administered 2022-12-07: 15 mL via OPHTHALMIC

## 2022-12-07 MED ORDER — ACETAMINOPHEN 325 MG TABLET
650.0000 mg | ORAL_TABLET | ORAL | Status: DC | PRN
Start: 2022-12-07 — End: 2022-12-07
  Filled 2022-12-07: qty 2

## 2022-12-07 MED ORDER — TETRACAINE HCL (PF) 0.5 % EYE DROPS
1.0000 [drp] | Freq: Once | OPHTHALMIC | Status: DC | PRN
Start: 2022-12-07 — End: 2022-12-07
  Administered 2022-12-07: 1 [drp] via OPHTHALMIC

## 2022-12-07 MED ORDER — PREDNISOLONE ACETATE 1 % EYE DROPS,SUSPENSION
1.0000 [drp] | Freq: Four times a day (QID) | OPHTHALMIC | Status: DC
Start: 2022-12-07 — End: 2023-02-01

## 2022-12-07 MED ORDER — EPINEPHRINE HCL (PF) 1 MG/ML (1 ML) INJECTION SOLUTION
Freq: Once | INTRAMUSCULAR | Status: DC | PRN
Start: 2022-12-07 — End: 2022-12-07

## 2022-12-07 MED ORDER — LACTATED RINGERS INTRAVENOUS SOLUTION
INTRAVENOUS | Status: DC
Start: 2022-12-07 — End: 2022-12-07

## 2022-12-07 MED ORDER — OFLOXACIN 0.3 % EYE DROPS
1.0000 [drp] | OPHTHALMIC | Status: AC
Start: 2022-12-07 — End: 2022-12-07
  Administered 2022-12-07 (×3): 1 [drp] via OPHTHALMIC
  Filled 2022-12-07: qty 5

## 2022-12-07 MED ORDER — OFLOXACIN 0.3 % EYE DROPS
1.0000 [drp] | Freq: Four times a day (QID) | OPHTHALMIC | Status: DC
Start: 2022-12-07 — End: 2023-02-01

## 2022-12-07 MED ORDER — LIDOCAINE 1 %-PHENYLEPHRIN 1.5 % IN BALANCED SALT(PF) INTRAOCULAR SOLN
1.0000 mL | INJECTION | Freq: Once | INTRAOCULAR | Status: DC | PRN
Start: 2022-12-07 — End: 2022-12-07
  Administered 2022-12-07: 1 mL via OPHTHALMIC
  Filled 2022-12-07: qty 1

## 2022-12-07 SURGICAL SUPPLY — 11 items
CANNULA OPTH 25GA 7MM FLAT TIP ANG HDSCT STRL DISP (MED SURG SUPPLIES) ×1 IMPLANT
CANNULA OPTH GRY 7/8IN 27GA VSTC 45D RND CORT HDSCT CLEAVE 11MM STRL LF (MED SURG SUPPLIES) ×1 IMPLANT
CYSTO OPTH 12MM 25GA ANG STRL DISP (OPHTHALMIC SUPPLIES (NOT LENS)) ×1 IMPLANT
GARTER EYE ASST_28-6536 (GLOVES AND ACCESSORIES) ×1 IMPLANT
LENS IOL +21 DIOP AUTONOME CLAREON ASPH HDRPHB ACRL STRL LF (IMPLANTS INTER-OCULAR LENS) ×1 IMPLANT
PACK CATARACT BX/6 (CUSTOM TRAYS & PACK) ×1 IMPLANT
PACK SOLUTION COMBO_MPI006102 6EA/CS (MEDICATIONS/SOLUTIONS) ×1 IMPLANT
SHIELD EYE 76.2X60.3MM ASST AL EY GRTR FOX CVR (OPHTHALMIC SUPPLIES (NOT LENS)) ×1 IMPLANT
SYRINGE 1ML LF  STRL ST TB DISP (MED SURG SUPPLIES) ×1 IMPLANT
TIP SUCT/IRRG .03MM INTREPID 35D BNT PLMR DISP (MED SURG SUPPLIES) ×1 IMPLANT
WIPE 3X3IN LF  ULTRACELL SURG INSTR SOLAN THK2MM (MED SURG SUPPLIES) ×1 IMPLANT

## 2022-12-07 NOTE — Discharge Instructions (Signed)
SURGICAL DISCHARGE INSTRUCTIONS     Dr. Moore, Charles, MD  performed your PHACO WITH INTRAOCULAR LENS today at the [facility]    [facility]:  Monday through Friday from 6 a.m. - 7 p.m.: [phone]  Between 7 p.m. - 6 a.m., weekends and holidays:  Call [facility] at [phone] or [phone].    PLEASE SEE WRITTEN HANDOUTS AS DISCUSSED BY YOUR NURSE:      SIGNS AND SYMPTOMS OF A WOUND / INCISION INFECTION   Be sure to watch for the following:   Increase in redness or red streaks near or around the wound or incision.   Increase in pain that is intense or severe and cannot be relieved by the pain medication that your doctor has given you.   Increase in swelling that cannot be relieved by elevation of a body part, or by applying ice, if permitted.   Increase in drainage, or if yellow / green in color and smells bad. This could be on a dressing or a cast.   Increase in fever for longer than 24 hours, or an increase that is higher than 101 degrees Fahrenheit (normal body temperature is 98 degrees Fahrenheit). The incision may feel warm to the touch.    **CALL YOUR DOCTOR IF ONE OR MORE OF THESE SIGNS / SYMPTOMS SHOULD OCCUR.    ANESTHESIA INFORMATION   ANESTHESIA -- ADULT PATIENTS:  You have received intravenous sedation / general anesthesia, and you may feel drowsy and light-headed for several hours. You may even experience some forgetfulness of the procedure. DO NOT DRIVE A MOTOR VEHICLE or perform any activity requiring complete alertness or coordination until you feel fully awake in about 24-48 hours. Do not drink alcoholic beverages for at least 24 hours. Do not stay alone, you must have a responsible adult available to be with you. You may also experience a dry mouth or nausea for 24 hours. This is a normal side effect and will disappear as the effects of the medication wear off.    REMEMBER   If you experience any difficulty breathing, chest pain, bleeding that you feel is  excessive, persistent nausea or vomiting or for any other concerns:  Call your physician Dr. Moore at [phone] or [phone]. You may also ask to have the  doctor on call paged. They are available to you 24 hours a day.    SPECIAL INSTRUCTIONS / COMMENTS       FOLLOW-UP APPOINTMENTS   Please call patient services at [phone] or [phone] to schedule a date / time of return. They are open Monday - Friday from 7:30 am - 5:00 pm.

## 2022-12-07 NOTE — Anesthesia Postprocedure Evaluation (Signed)
Anesthesia Post Op Evaluation    Patient: [name]  Procedure(s):  [facility] WITH INTRAOCULAR LENS    Last Vitals:Temperature: 36.2 C (97.2 F) (12/07/22 1224)  Heart Rate: 73 (12/07/22 1224)  BP (Non-Invasive): 103/78 (12/07/22 1224)  Respiratory Rate: 19 (12/07/22 1224)  SpO2: 97 % (12/07/22 1224)    No notable events documented.    Patient is sufficiently recovered from the effects of anesthesia to participate in the evaluation and has returned to their pre-procedure level.  Patient location during evaluation: PACU       Patient participation: complete - patient participated  Level of consciousness: awake and alert and responsive to verbal stimuli    Pain management: adequate  Airway patency: patent    Anesthetic complications: no  Cardiovascular status: acceptable  Respiratory status: acceptable  Hydration status: acceptable  Patient post-procedure temperature: Pt Normothermic   PONV Status: Absent

## 2022-12-07 NOTE — Anesthesia Transfer of Care (Signed)
ANESTHESIA TRANSFER OF CARE   [name] is a 67 y.o. ,female, Weight: 96.1 kg (211 lb 13.8 oz)   had Procedure(s):  PHACO WITH INTRAOCULAR LENS  performed  12/07/22   Primary Service: Gwendalyn Ege, MD    Past Medical History:   Diagnosis Date    Anxiety     Arthritis     Awareness under anesthesia     woke during colonoscopy    Cancer (CMS [facility])     internal skin cancer    Chest pain 02/19/2016    Chronic pain     lower back    Depression     Dyspnea on exertion     GERD (gastroesophageal reflux disease)     controlled w/med    Headache     Hearing loss     Heart murmur 1979    benign    Hyperlipidemia LDL goal < 100 08/29/2013    denies    Migraine     MINOR CAD (coronary artery disease) 11/10/2012    no treatment other than cholesterol medications; follows regularly with K.Dalton, PA    Multinodular goiter     Muscle weakness     neck     Neck problem     spinal stenosis    Obesity     Peripheral edema     Rash     face only eczema    Rheumatic fever     in 0000000 no complications    S/P thyroidectomy     Sleep apnea     Squamous cell carcinoma 02/06/2015    right side of neck    Thyroid disorder     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    Tinnitus     Vaginal prolapse 2012    surgery improved significantly    Wears glasses       Allergy History as of 12/07/22       CODEINE         Noted Status Severity Type Reaction    12/10/15 0941 Pantojas, [place]  Active Low  Itching    Comments: RASH     09/19/13 1430 Wallace Cullens Joy  Deleted Low  Itching    Comments: RASH     09/04/09 Ginette Pitman  Active   Itching    Comments: RASH     10/10/07   Active       Comments: RASH               STRAWBERRY         Noted Status Severity Type Reaction    03/18/11 1634 Johnnette Barrios  Deleted       10/10/08 [name], MD  Deleted       10/10/07   Active                 MELOXICAM         Noted Status Severity Type Reaction    11/24/18 0857 Kisner, Bishop Limbo, RN 06/26/09 Active Low  Nausea/ Vomiting     Comments: Nervous and shakes      12/10/15 0941 Pantojas, Kristy 06/26/09 Active Low  Nausea/ Vomiting    06/26/09 Corky Sing N[place]ole 06/26/09 Active   Nausea/ Vomiting              NICKEL         Noted Status Severity Type Reaction    07/19/19 0959 Linnell Fulling, RN 07/24/12 Active  Topical  Comments: Blisters  "Allergic to all kinds of metal"      11/24/18 0856 Kisner, Bishop Limbo, RN 07/24/12 Active  Topical     Comments: Blisters       07/24/12 1041 [name], MD 07/24/12 Active  Topical               BLUE DYE         Noted Status Severity Type Reaction    07/24/12 1041 [name], MD 07/24/12 Active  Topical     Comments: Told to avoid due to patch test result by [facility] [place]               DULOXETINE         Noted Status Severity Type Reaction    07/24/12 1052 [name], MD 07/24/12 Active Medium Systemic     Comments: Rash on feet and hands               NAPROXEN         Noted Status Severity Type Reaction    11/01/12 0913 Sanjuana Letters, MA 11/01/12 Active Low  Nausea/ Vomiting              ATORVASTATIN         Noted Status Severity Type Reaction    06/19/18 2149 Erskine Squibb, RX STUDENT 01/27/15 Active  Side Effect Myalgia    Comments: Muscle pain     01/27/15 1035 [name], MD 01/27/15 Active  Side Effect     Comments: Muscle pain               FLAVORING AGENT         Noted Status Severity Type Reaction    07/19/19 0958 Linnell Fulling, RN 05/03/17 Active    Other Adverse Reaction (Add comment)    Comments: Patient unsure of reaction     04/03/18 1905 Suella Grove, MA 05/03/17 Active                 NSAIDS (NON-STEROIDAL ANTI-INFLAMMATORY DRUG)         Noted Status Severity Type Reaction    11/24/18 0857 Kisner, Bishop Limbo, RN 05/03/17 Active Low  Nausea/ Vomiting    04/03/18 1905 Suella Grove, MA 05/03/17 Active                     I completed my transfer of care / handoff to the receiving personnel during which we  discussed:  Access, Airway, All key/critical aspects of case discussed, Analgesia, Antibiotics, Expectation of post procedure, Fluids/Product, PMHx, Labs and Gave opportunity for questions and acknowledgement of understanding      Post Location: Phase II                        Additional Info:Patient transferred to Phase II on RA. VSS, Awake and following commands. Report given to bedside RN                                     Last OR Temp: Temperature: 36 C (96.8 F)  ABG:  PCO2 (VENOUS)   Date Value Ref Range Status   02/19/2016 36.00 (L) 41.00 - 51.00 mm/Hg Final     PO2 (VENOUS)   Date Value Ref Range Status   02/19/2016 43.0 35.0 - 50.0 mm/Hg Final     POTASSIUM  Date Value Ref Range Status   09/14/2022 3.8 3.5 - 5.1 mmol/L Final   01/23/2015 4.3 3.5 - 5.1 mmol/L Final     KETONES   Date Value Ref Range Status   11/27/2020 Negative Negative mg/dL Final     WHOLE BLOOD POTASSIUM   Date Value Ref Range Status   02/19/2016 3.9 3.5 - 5.0 mmol/L Final     CALCIUM   Date Value Ref Range Status   09/14/2022 9.2 8.6 - 10.3 mg/dL Final     Comment:     Gadolinium-containing contrast can interfere with calcium measurement.   01/23/2015 9.8 8.5 - 10.4 mg/dL Final     Comment:     Test performed by [facility], [address], [place],  [place] [address]       Calculated P Axis   Date Value Ref Range Status   11/29/2022 39 degrees Final     Calculated R Axis   Date Value Ref Range Status   11/29/2022 -20 degrees Final     Calculated T Axis   Date Value Ref Range Status   11/29/2022 -1 degrees Final     IONIZED CALCIUM   Date Value Ref Range Status   02/19/2016 1.19 1.10 - 1.36 mmol/L Final     GLUCOSE, POINT OF CARE   Date Value Ref Range Status   02/06/2015 104 70 - 105 mg/dL Final     HEMOGLOBIN   Date Value Ref Range Status   02/19/2016 13.9 12.0 - 18.0 g/dL Final     OXYHEMOGLOBIN   Date Value Ref Range Status   02/19/2016 83.5 (H) 40.0 - 80.0 % Final     CARBOXYHEMOGLOBIN   Date Value Ref Range Status    02/19/2016 1.9 0.0 - 2.5 % Final     MET-HEMOGLOBIN   Date Value Ref Range Status   02/19/2016 0.1 0.0 - 3.5 % Final     BASE EXCESS   Date Value Ref Range Status   02/19/2016 0.6 -3.0 - 3.0 mmol/L Final     BICARBONATE (VENOUS)   Date Value Ref Range Status   02/19/2016 25.0 22.0 - 26.0 mmol/L Final     %FIO2 (VENOUS)   Date Value Ref Range Status   02/19/2016 28.0 % Final     Airway:* No LDAs found *  Blood pressure 131/70, pulse 67, temperature 36 C (96.8 F), resp. rate 15, height 1.637 m (5' 4.45"), weight 96.1 kg (211 lb 13.8 oz), SpO2 98%, not currently breastfeeding.

## 2022-12-07 NOTE — Nurses Notes (Signed)
Patient is ready for SBAR but unable to chart in case tracking d/t computer locking the case tracking flowsheet.

## 2022-12-07 NOTE — OR Surgeon (Signed)
[place] OF OPHTHALMOLOGY   OPERATION SUMMARY     PATIENT NAME: [place] NUMBER: [id number]   DATE OF SERVICE: 12/07/2022   DATE OF BIRTH: [birth year]    PREOPERATIVE DIAGNOSIS: Cataract, right eye.[place]   H25.11    NAME OF PROCEDURE: Phacoemulsification of cataract with posterior chamber lens implant, right eye.  Cataract Removal with Implant   CPT [id number]  SURGEONS: Gwendalyn Ege MD (staff), [place] Wrenn Willcox, MD (assistant).   ANESTHESIA: MAC, topical.   ESTIMATED BLOOD LOSS: Less than 1 drop.   COMPLICATIONS: There were no complications.   SPECIMENS: None.     DESCRIPTION OF PROCEDURE: In the preanesthesia area, the patient was given a drop of topical betadine and topical Tetracaine. The patient was taken back to the operating room suite.  Topical  Akten (lidocaine gel) was applied.  A surgical Time-Out confirmed the correct patient, procedure, operative eye, and IOL model and power.  The eye was then prepped and draped in the usual sterile fashion. A speculum was placed in the eye. A disposable supersharp blade was used to make a 1-mm side port entry through clear cornea along the limbus, and 1 mL of 1% preservative-free Xylocaine was irrigated into the anterior chamber. The anterior chamber was filled with viscoelastic material. The keratome was used to make a 2.4-mm phaco port incision through clear cornea temporally along the limbus. The cystotome and capsulorrhexis forceps were used to create a continuous curvilinear capsulorrhexis. Hydrodissection was performed with balanced salt solution on a cannula. The tip of the phacoemulsification handpiece was introduced into the eye, and the lens nucleus was emulsified using a bimanual nuclear splitting technique.  The phaco tip was removed and the tip of the I/A handpiece introduced into the eye. The remaining lens cortical material was aspirated from the eye. The I/A tip was removed, and the capsular bag was inflated  with viscoelastic material. The posterior chamber lens implant, [id number], of +21.0 diopters power was brought onto the field and inspected. It was without defect and was loaded into the lens injector. The tip of the injector was inserted into the eye, and the lens implant was placed in the capsular bag. The injector tip was removed and the tip of the I/A handpiece reintroduced into the eye. The remaining viscoelastic material was aspirated. The I/A tip was removed, and the anterior chamber was pressurized with balanced salt solution. The anterior wound margins were hydrated with balanced salt solution and 41m of Zinacef was instilled into the anterior chamber.  Weck-cel sponges were gently brushed against the incision and confirmed that there was no wound leakage.  The speculum was removed from the eye, and the eye was bandaged with a rigid shield. The patient was returned to same day surgery in excellent condition. Dr. MLaurance Flattenwas present for the key and critical portions of the case and immediately available at all times.      JMartiniqueGuffey, MD  Resident   [place] Department of Ophthalmology     CKeene Breath MD   Assistant Professor   [facility]

## 2022-12-07 NOTE — Discharge Summary (Signed)
Discharge Note  Patient meets discharge criteria  Discharge to home  Return to [facility] as scheduled or sooner if need be  Keep surgical dressing in place    Gwendalyn Ege, MD 12/07/2022, 12:02

## 2022-12-07 NOTE — Brief Op Note (Signed)
Brief Operative Note    Pre-op diagnosis: NSC (Nuclear sclerotic) and Cortical  Cataract of right eye(s) with functional complaints of reading, driving, performing daily activities    Post-op diagnosis: Same    Procedure: Phacoemulsification of cataract with lens implant right eye(s).    Surgeons: Clenton Pare. Laurance Flatten, MD & [place] GUFFEY, MD    Anesthesia: Topical anesthesia with monitored anesthesia care (MAC)    Estimated Blood Loss: Less than 1 drop    Specimens: None    Complications: None  @OPHTHPI$ @      Gwendalyn Ege, MD 12/07/2022, 12:01

## 2022-12-07 NOTE — Progress Notes (Signed)
Procedure: Phacoemulsification of cataract with lens implant right eye(s).    Pre-op diagnosis: Cataract right eye(s).      The patient has been thoroughly counseled as to the indications, alternatives, and potential risks of the procedures including, but not limited to, failure of operation, need for further surgery, scar, complications from anesthesia, loss of vision, infection, bleeding, corneal abrasion.    The patient understands the procedure with its risks and benefits, and desires to proceed as outlined above.    Gwendalyn Ege, MD 12/07/2022, 11:27

## 2022-12-07 NOTE — H&P (Signed)
[facility]                                                     H&P UPDATE FORM    Patient: [name]  Med Record # [id number]  Date of Birth: 1955-11-01  Age:  67 y.o.   Gender: female    Date of Admission: 12/07/2022    Outpatient Pre-Surgical H & P updated the day of the procedure.  1.I reviewed the H&P completed in 30 days of today's surgical procedure  on  11/29/2022 by Alvis Lemmings, PA-C.  Assessment remains unchanged based on completion of today's re-assessment.        Change in medications: No      Last Menstrual Period: No/not applicable      Comments:     2.  Patient continues to be appropiate candidate for planned surgical procedure. YES    Martinique Guffey, MD 12/07/2022, 10:54    I saw and examined the patient.  I reviewed the resident's note.  I agree with the findings and plan of care as documented in the resident's note.  Any exceptions/additions are edited/noted.    Gwendalyn Ege, MD

## 2022-12-07 NOTE — Anesthesia Preprocedure Evaluation (Addendum)
ANESTHESIA PRE-OP EVALUATION  Planned Procedure: PHACO WITH INTRAOCULAR LENS (Right: Eye)  Review of Systems                   Pulmonary   sleep apnea and past history of smoking ,   Cardiovascular    Murmur, CAD, DOE, ECG reviewed and hyperlipidemia , Exercise Tolerance: <4 METS        GI/Hepatic/Renal    GERD and well controlled        Endo/Other    obesity,      Neuro/Psych/MS    headaches, anxiety, depression, Neck problems    peripheral neuropathy,  Cancer  CA (RT parotid gland),                 Physical Assessment      Airway       Mallampati: II    TM distance: >3 FB    Neck ROM: full  Mouth Opening: good.  No Facial hair  No Beard        Dental           (+) upper dentures, missing           Pulmonary    Breath sounds clear to auscultation       Cardiovascular    Rhythm: regular  Rate: Normal       Other findings          Plan  ASA 3     Planned anesthesia type: MAC                         Intravenous induction     Anesthesia issues/risks discussed are: PONV, Cardiac Events/MI, Intraoperative Awareness/ Recall, Stroke and Aspiration.  Anesthetic plan and risks discussed with patient  signed consent obtained      Use of blood products discussed with patient who consented to blood products.      Patient's NPO status is appropriate for Anesthesia.           Plan discussed with CRNA.

## 2022-12-08 ENCOUNTER — Ambulatory Visit: Payer: Medicare Other | Attending: Ophthalmology | Admitting: Ophthalmology

## 2022-12-08 ENCOUNTER — Encounter (INDEPENDENT_AMBULATORY_CARE_PROVIDER_SITE_OTHER): Payer: Self-pay | Admitting: Ophthalmology

## 2022-12-08 DIAGNOSIS — Z9889 Other specified postprocedural states: Secondary | ICD-10-CM

## 2022-12-08 NOTE — Progress Notes (Addendum)
[name]  [place] [id number]  Operated by [facility]         Patient Name: [name]  MRN#: [id number]  [place]: 19-Aug-1956    Date of Service: 12/08/2022    Chief Complaint    Post-op Eye         [name] is a 67 y.o. female who presents today for evaluation/consultation of:  HPI       Post-op Eye    Pain was noted as 0/10.             Comments    [name] is a 67 y.o. female here for 1 day POV for  Phacoemulsification of cataract with posterior chamber lens implant, right eye.  Cataract Removal with Implant 12/07/22  Pt slept well  Pt c/o some soreness, denies pain  Pt using drops as instructed          Last edited by Renaye Rakers, Ophthalmic Testing Assistant on 12/08/2022  2:24 PM.        ROS    Positive for: Eyes  Negative for: Constitutional, Gastrointestinal, Neurological, Skin, Genitourinary, Musculoskeletal, HENT, Endocrine, Cardiovascular, Respiratory, Psychiatric, Allergic/Imm, Heme/Lymph  Last edited by Renaye Rakers, Ophthalmic Testing Assistant on 12/08/2022  2:24 PM.         All other systems Negative    Renaye Rakers, Ophthalmic Testing Assistant  12/08/2022, 14:26  Base Eye Exam       Visual Acuity (Snellen - Linear)         Right Left    Dist sc 20/80     Dist cc  20/25 +1    Dist ph sc 20/50               Tonometry ([facility], 2:34 PM)         Right Left    Pressure 13 13              Pupils         Shape React APD    Right Round Minimal None    Left                 Visual Fields (Counting fingers)         Right Left     Full Full              Extraocular Movement         Right Left     Full Full              Neuro/Psych       Oriented x3: Yes    Mood/Affect: Normal                  Slit Lamp and Fundus Exam       External Exam         Right Left    External Normal Normal              Slit Lamp Exam         Right Left    Lids/Lashes Normal , Ecchymosis    Conjunctiva/Sclera Injection White and quiet     Cornea Clear Clear    Anterior Chamber Deep and quiet Deep and quiet    Iris Round and reactive Round and reactive    Lens Posterior chamber intraocular lens 1+ NS    Anterior Vitreous Normal Normal  MD Addition to HPI:  Patient here for post op visit #1 right. Patient reports no pain following cataract surgery      This patient has a satisfactory Post-op visit #1 following cataract extraction with PCIOL right eye. There is mild corneal edema. There is no elevation of the intraocular pressure..    Plan: Vigamox/Ocuflox 1 drop QID to operative eye. Pred Forte 1 Drop QID to operatve eye. Patient instructed in proper drop usage. Patient advised to wear shield over operative eye at bedtime for one week. Return to clinic in approximately one week. Patient instructed to call [facility] with any change in vision, pain, or redness between now and next visit. Patient is to avoid activities that might result in bumping the eye and patient is not to immerse head under water. Showering is permitted.     I reviewed and made necessary changes to the technician, resident or fellow note regarding CC/HPI/ROS/Past Family, Medical and Social Hx/Surg Hx.    Martinique Guffey, MD  12/08/2022, 14:39              I reviewed and made necessary changes to the technician, resident or fellow note regarding CC/HPI/ROS/Past Family, Medical and Social Hx/Surg Hx.  Gwendalyn Ege, MD 12/08/2022, 14:47      I saw and examined the patient.  I reviewed the resident's note.  I agree with the findings and plan of care as documented in the resident's note. I personally reviewed and interpreted all testing and/or imaging performed at this visit and agree with the resident or fellow's interpretation. Any exceptions/additions are edited/noted.    Gwendalyn Ege, MD 12/08/2022, 14:47

## 2022-12-15 ENCOUNTER — Other Ambulatory Visit: Payer: Self-pay

## 2022-12-15 ENCOUNTER — Ambulatory Visit: Payer: Medicare Other | Attending: Ophthalmology | Admitting: Ophthalmology

## 2022-12-15 ENCOUNTER — Encounter (INDEPENDENT_AMBULATORY_CARE_PROVIDER_SITE_OTHER): Payer: Self-pay | Admitting: Ophthalmology

## 2022-12-15 DIAGNOSIS — Z9889 Other specified postprocedural states: Secondary | ICD-10-CM

## 2022-12-15 DIAGNOSIS — Z961 Presence of intraocular lens: Secondary | ICD-10-CM

## 2022-12-15 NOTE — Progress Notes (Addendum)
OPHTHALMOLOGY, [place]  [place] [id number]  Operated by [facility]         Patient Name: [name]  MRN#: [id number]  [place]: 08-Oct-1956    Date of Service: 12/15/2022    Chief Complaint    [place] is a 67 y.o. female who presents today for evaluation/consultation of:  HPI       Post-op Eye              Comments: 1 week POV for  Phacoemulsification of cataract with posterior chamber lens implant, right eye              Comments    Pt is here for 1 week POV  States va is doing well, clear  Pt denies new FOL, new floaters, double vision, distortion, and pain  C/o minor irritation od  Gtts:   Prednisone QID OD  Ofloxacin  qid od            Last edited by Lind Guest on 12/15/2022  2:26 PM.        ROS    Positive for: Eyes  Negative for: Constitutional, Gastrointestinal, Neurological, Skin, Genitourinary, Musculoskeletal, HENT, Endocrine, Cardiovascular, Respiratory, Psychiatric, Allergic/Imm, Heme/Lymph  Last edited by Lind Guest on 12/15/2022  2:26 PM.         All other systems Negative    Lind Guest  12/15/2022, 14:27  Base Eye Exam       Visual Acuity (Snellen - Linear)         Right Left    Dist sc 20/60     Dist cc  20/25    Dist ph sc 20/40               Tonometry ([facility]open, 2:31 PM)         Right Left    Pressure 13 13              Pupils         APD    Right None    Left None              Visual Fields (Counting fingers)         Right Left     Full Full              Extraocular Movement         Right Left     Full Full              Neuro/Psych       Oriented x3: Yes    Mood/Affect: Normal                  Slit Lamp and Fundus Exam       External Exam         Right Left    External Normal Normal              Slit Lamp Exam         Right Left    Lids/Lashes Normal , Ecchymosis    Conjunctiva/Sclera Injection White and quiet    Cornea Clear Clear    Anterior Chamber Deep and quiet Deep and quiet    Iris Round and  reactive Round and reactive    Lens Posterior chamber intraocular lens 1+ NS    Anterior Vitreous Normal Normal  MD Addition to HPI: Patient here for post op visit #2 right. Patient reports that they have been compliant with post op drops since last visit         This patient has satisfactory post-op visit #2 following cataract extraction with lens implant of right eye.    Plan: Discontinue Vigamox/Ocuflox eye drops. Taper Pred Forte as follows: 1 drop to operative eye TID x 1 week, then BID x week, then Q Day x week, then stop. Discontinue eye shield at bedtime. Return to clinic in 3-4 weeks, do refraction then.       I reviewed and made necessary changes to the technician, resident or fellow note regarding CC/HPI/ROS/Past Family, Medical and Social Hx/Surg Hx.    Gwendalyn Ege, MD  12/15/2022, 14:46

## 2022-12-16 ENCOUNTER — Encounter (HOSPITAL_COMMUNITY): Payer: Self-pay | Admitting: Student in an Organized Health Care Education/Training Program

## 2022-12-16 ENCOUNTER — Other Ambulatory Visit: Payer: Self-pay

## 2022-12-16 ENCOUNTER — Ambulatory Visit (INDEPENDENT_AMBULATORY_CARE_PROVIDER_SITE_OTHER): Payer: Medicare Other | Admitting: Student in an Organized Health Care Education/Training Program

## 2022-12-16 VITALS — BP 129/74 | HR 88 | Resp 18 | Ht 64.0 in | Wt 216.5 lb

## 2022-12-16 DIAGNOSIS — F4310 Post-traumatic stress disorder, unspecified: Secondary | ICD-10-CM

## 2022-12-16 DIAGNOSIS — F411 Generalized anxiety disorder: Secondary | ICD-10-CM

## 2022-12-16 DIAGNOSIS — F332 Major depressive disorder, recurrent severe without psychotic features: Secondary | ICD-10-CM

## 2022-12-16 MED ORDER — MIRTAZAPINE 15 MG TABLET
15.0000 mg | ORAL_TABLET | Freq: Every evening | ORAL | 3 refills | Status: DC
Start: 2022-12-16 — End: 2022-12-16

## 2022-12-16 NOTE — Addendum Note (Signed)
Addended by: Allena Katz on: 12/16/2022 01:08 PM     Modules accepted: Orders
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
## 2022-12-30 ENCOUNTER — Ambulatory Visit (HOSPITAL_COMMUNITY): Payer: Self-pay | Admitting: Student in an Organized Health Care Education/Training Program

## 2022-12-30 ENCOUNTER — Encounter (HOSPITAL_COMMUNITY): Payer: Self-pay | Admitting: Student in an Organized Health Care Education/Training Program

## 2022-12-30 MED ORDER — DESVENLAFAXINE SUCCINATE ER 50 MG TABLET,EXTENDED RELEASE 24 HR
50.0000 mg | ORAL_TABLET | Freq: Every day | ORAL | 3 refills | Status: DC
Start: 2022-12-30 — End: 2023-01-18

## 2022-12-30 NOTE — Telephone Encounter (Addendum)
Patient states she has tapered her Prozac down to 20 mg daily. Per discussion at last appointment, we were waiting for patient's new insurance to kick in before switching to Pristiq.    Plan  -continue Prozac 20 mg daily for 1 week, then stop  -start Pristiq 50 mg daily for mood/anxiety/PTSD    1. Medication Name and Dosage of Medication with Directions for Use  Pristiq 50 mg daily    2. Diagnosis and ICD-10 code for Diagnosis   F33.2 - MDD, recurrent, current episode severe, without psychotic features  F41.1 - GAD  F43.1 - PTSD  G89.29 - Chronic pain    3. Previous Medications tried with Dates tried OR  A Clinical justification as to why this medication is needed.      Patient's mood/anxiety have been poorly-controlled on Prozac recently. Additionally, she has chronic pain in neck/side of face. Pristiq chosen because patient has a well-documented allergy to blue dyes in several medications and Pristiq is one of the few antidepressants that does not have blue dye in it. Patient has previously tried Prozac, Cymbalta, Remeron, Vistaril, trazodone, Ambien, Wellbutrin XL.     Orders Placed This Encounter    desvenlafaxine succinate (PRISTIQ) 50 mg Oral Tablet Sustained Release 24 hr     Allena Katz, MD 12/30/2022 16:30     Regarding: Rx  ----- Message from Lesly Rubenstein sent at 12/30/2022 10:21 AM EST -----  Pt ready to start the [place]. Please advise.   Lesly Rubenstein    Preferred Pharmacy     [id number] - [place], [place] [address]    Phone: [phone] Fax: [phone]    Hours: Not open 24 hours        Thanks!  Lesly Rubenstein

## 2022-12-31 ENCOUNTER — Telehealth (HOSPITAL_COMMUNITY): Payer: Self-pay

## 2022-12-31 NOTE — Telephone Encounter (Addendum)
CM contacted [facility] to see if the Pristiq needed a prior auth before working on it, and pharmacy states that it does not need a PA and pt already picked up this medication.    Corley. Colletta Maryland, CASE MANAGER        ----- Message from Allena Katz, MD sent at 12/30/2022  4:35 PM EST -----  Regarding: PA  Hello again, would you be able to help me out with this prior auth as well? I apologize in advance if she doesn't need one for this medication. I go ahead and send the information whenever the pop-up tells me a prior Josem Kaufmann is required, but I feel like it has been wrong a lot recently!    -Sam    1. Medication Name and Dosage of Medication with Directions for Use  Pristiq 50 mg daily    2. Diagnosis and ICD-10 code for Diagnosis   F33.2 - MDD, recurrent, current episode severe, without psychotic features  F41.1 - GAD  F43.1 - PTSD  G89.29 - Chronic pain    3. Previous Medications tried with Dates tried OR  A Clinical justification as to why this medication is needed.      Patient's mood/anxiety have been poorly-controlled on Prozac recently. Additionally, she has chronic pain in neck/side of face. Pristiq chosen because patient has a well-documented allergy to blue dyes in several medications and Pristiq is one of the few antidepressants that does not have blue dye in it. Patient has previously tried Prozac, Cymbalta, Remeron, Vistaril, trazodone, Ambien, Wellbutrin XL.

## 2023-01-11 ENCOUNTER — Encounter (INDEPENDENT_AMBULATORY_CARE_PROVIDER_SITE_OTHER): Payer: Self-pay

## 2023-01-11 ENCOUNTER — Ambulatory Visit (INDEPENDENT_AMBULATORY_CARE_PROVIDER_SITE_OTHER): Payer: Medicare Other

## 2023-01-11 ENCOUNTER — Ambulatory Visit (INDEPENDENT_AMBULATORY_CARE_PROVIDER_SITE_OTHER): Payer: Self-pay | Admitting: Ophthalmology

## 2023-01-11 VITALS — BP 150/91 | HR 78 | Temp 98.1°F | Resp 18 | Ht 64.0 in | Wt 217.8 lb

## 2023-01-11 DIAGNOSIS — H5789 Other specified disorders of eye and adnexa: Secondary | ICD-10-CM

## 2023-01-11 NOTE — Telephone Encounter (Signed)
-----   Message from [place] sent at 01/11/2023 12:21 PM EDT -----  Pt calling and said she had surgery a while ago and she needs ot be seen today. She said her eye is all puffy an dit hurts to touch and she said it feels like something is stuck in her eye. She said in the evening it hurts the worst. She said she is not sure if it is infected or not. She said both eyes have discharge coming form them. Please call back to advise, Thank you!!

## 2023-01-11 NOTE — Nursing Note (Signed)
01/11/23 1300   Age 67 and Older   Right Eye Reading 20/100   Right Eye Corrected yes   Initials ak   Left Eye Reading 20/25   Left Eye Corrected yes   Initials ak   Reading 20/20   Corrected yes   Initials ak

## 2023-01-11 NOTE — Telephone Encounter (Signed)
Returned call to patient. Patient reports OD has irritation, increased tearing and crusting. And OS has "a bump on it again" Patient advised provider not in [facility] until 3.25.24 offered to schedule then or sooner with alternate provider or gave patient option to go through ER for assessment by oncall provider. Patient requested to schedule on 3.25.24 with Dr. Laurance Flatten. Jorja Loa, RN  01/11/2023, 13:21
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
## 2023-01-14 ENCOUNTER — Ambulatory Visit (INDEPENDENT_AMBULATORY_CARE_PROVIDER_SITE_OTHER): Payer: Medicare Other

## 2023-01-14 ENCOUNTER — Encounter (INDEPENDENT_AMBULATORY_CARE_PROVIDER_SITE_OTHER): Payer: Self-pay

## 2023-01-14 ENCOUNTER — Other Ambulatory Visit: Payer: Self-pay

## 2023-01-14 VITALS — BP 151/82 | HR 112 | Temp 100.7°F | Resp 20 | Ht 64.0 in | Wt 217.0 lb

## 2023-01-14 DIAGNOSIS — R079 Chest pain, unspecified: Secondary | ICD-10-CM

## 2023-01-14 NOTE — Progress Notes (Signed)
Patient presents to urgent care with complaints of chest pain, chest pressure, and shortness of breath that started 3-4 hours prior to arrival.  Patient has a history of coronary artery disease, hyperlipidemia, thyroid disorder, and squamous cell carcinoma.    Given patient's history and tachycardia as well as her blood pressure reading, the ambulance was called for emergent transport to the emergency room for ACS rule out.    Her temperature was noted only after paramedics took over her care.    Elayne Snare, MD, 01/14/2023 17:37

## 2023-01-15 LAB — ECG 12 LEAD - (AMB USE ONLY)(MUSE, IN CLINIC)
Atrial Rate: 96 {beats}/min
Calculated P Axis: 35 degrees
Calculated R Axis: -21 degrees
Calculated T Axis: 0 degrees
PR Interval: 150 ms
QRS Duration: 70 ms
QT Interval: 334 ms
QTC Calculation: 421 ms
Ventricular rate: 96 {beats}/min

## 2023-01-17 ENCOUNTER — Encounter (INDEPENDENT_AMBULATORY_CARE_PROVIDER_SITE_OTHER): Payer: Self-pay | Admitting: Ophthalmology

## 2023-01-17 ENCOUNTER — Ambulatory Visit: Payer: Medicare Other | Attending: Ophthalmology | Admitting: Ophthalmology

## 2023-01-17 ENCOUNTER — Other Ambulatory Visit: Payer: Self-pay

## 2023-01-17 ENCOUNTER — Other Ambulatory Visit (HOSPITAL_BASED_OUTPATIENT_CLINIC_OR_DEPARTMENT_OTHER): Payer: Self-pay | Admitting: Physician Assistant

## 2023-01-17 DIAGNOSIS — Z961 Presence of intraocular lens: Secondary | ICD-10-CM

## 2023-01-17 DIAGNOSIS — Z9889 Other specified postprocedural states: Secondary | ICD-10-CM | POA: Insufficient documentation

## 2023-01-17 NOTE — Telephone Encounter (Signed)
Regarding: refill request  ----- Message from Flonnie Hailstone II sent at 01/17/2023 11:18 AM EDT -----  Alvis Lemmings, PA-C    Pharmacy requesting refill of the below medication. Please advise. Thank you.     Disp Refills Start End   gabapentin (NEURONTIN) 800 mg Oral Tablet 120 Tablet 1 10/05/2022 -   Sig - Route: Take 1 Tablet (800 mg total) by mouth Twice daily - Oral   Sent to pharmacy as: gabapentin 800 mg tablet (NEURONTIN)   Class: E-Rx   Non-formulary Exception Code: RXHUB/No Formulary Info Available      Disp Refills Start End   pantoprazole (PROTONIX) 40 mg Oral Tablet, Delayed Release (E.C.) 90 Tablet 3 06/07/2022 -   Sig - Route: Take 1 Tablet (40 mg total) by mouth Once a day - Oral   Patient taking differently: Take 1 Tablet (40 mg total) by mouth Once a day Takes in am   Sent to pharmacy as: pantoprazole 40 mg tablet,delayed release (PROTONIX)   Class: E-Rx   Non-formulary Exception Code: RXHUB/No Formulary Info Available      Disp Refills Start End   nitroGLYCERIN (NITROSTAT) 0.4 mg Sublingual Tablet, Sublingual 20 Tablet 2 11/29/2022 -   Sig - Route: Place 1 Tablet (0.4 mg total) under the tongue Every 5 minutes as needed for Chest pain for 3 doses over 15 minutes - Sublingual   Sent to pharmacy as: nitroglycerin 0.4 mg sublingual tablet (NITROSTAT)   Class: E-Rx   Non-formulary Exception Code: [id number] Formulary Info Available     Preferred Pharmacy     [facility] [place]    [place] [address]    Phone: [phone] Fax: [phone]    Hours: Not open 24 hours

## 2023-01-17 NOTE — Progress Notes (Addendum)
OPHTHALMOLOGY, [place]  [place] [address]  Operated by [facility]         Patient Name: [name]  MRN#: [id number]  [place]: 15-Feb-1956    Date of Service: 01/17/2023    Chief Complaint    Post Op         Tinna Abbate is a 67 y.o. female who presents today for evaluation/consultation of:  HPI    Patient Here For Acute Visit Follow Up Visit From Urgent Care  S/p CE IOL OD 12/07/2022  C/o 2 weeks Ago Having Irritation OD  C/o Tearing and FBS OD  Went to Urgent Care Dx with Laban Emperor Growing into OD  Doctor at Urgent Care Removed Lash  Symptoms Resolved After Visit  Not Using Any Drops In Eyes  Last edited by Coffee, Helene Kelp, COA on 01/17/2023  8:49 AM.        ROS    Positive for: Gastrointestinal, Neurological, Musculoskeletal, Eyes, Psychiatric, Heme/Lymph  Negative for: Constitutional, Skin, Genitourinary, HENT, Endocrine, Cardiovascular, Respiratory, Allergic/Imm  Last edited by Coffee, Helene Kelp, COA on 01/17/2023  8:50 AM.         All other systems Negative    Teresa Coffee, COA  01/17/2023, 08:55      Base Eye Exam       Visual Acuity (Snellen - Linear)         Right Left    Dist sc 20/40 -2 20/20    Dist ph sc 20/30 -1               Tonometry (Tonopen, 8:54 AM)         Right Left    Pressure 13 16              Pupils         APD    Right None    Left None              Visual Fields         Right Left     Full Full              Extraocular Movement         Right Left     Full Full              Neuro/Psych       Oriented x3: Yes    Mood/Affect: Normal                  Slit Lamp and Fundus Exam       External Exam         Right Left    External Normal Normal              Slit Lamp Exam         Right Left    Lids/Lashes Normal , Ecchymosis    Conjunctiva/Sclera Injection White and quiet    Cornea Clear Clear    Anterior Chamber Deep and quiet Deep and quiet    Iris Round and reactive Round and reactive    Lens Posterior chamber intraocular lens 1+ NS    Anterior  Vitreous Normal Normal                    MD Addition to HPI: F/U from urgent care had irritation OD, urgent care pulled lash          ENCOUNTER DIAGNOSES  ICD-10-CM   1. Pseudophakia of right eye  Z96.1   2. Postsurgical state, eye  Z98.890     No orders of the defined types were placed in this encounter.      Ophthalmic Plan of Care:    Resolved irritation    Follow up:    I have asked Jessieca Zeman to follow up  as scheuled          I have seen and examined the above patient. I discussed the above diagnoses listed in the assessment and the above ophthalmic plan of care with the patient and patient's family. All questions were answered. I reviewed and, when necessary, made changes to the technician/resident note, documented ophthalmology exam, chief complaint, history of present illness, allergies, review of systems, past medical, past surgical, family and social history. I personally reviewed and interpreted all testing and/or imaging performed at this visit and agree with the resident's or fellow's interpretation. Any exceptions/additions are edited/noted in the relevant encounter fields.      Gwendalyn Ege, MD  01/17/2023, 09:06

## 2023-01-18 ENCOUNTER — Inpatient Hospital Stay (HOSPITAL_BASED_OUTPATIENT_CLINIC_OR_DEPARTMENT_OTHER)
Admission: RE | Admit: 2023-01-18 | Discharge: 2023-01-18 | Disposition: A | Discharge status: Home / Self Care | Payer: Medicare Other | Source: Ambulatory Visit

## 2023-01-18 ENCOUNTER — Other Ambulatory Visit (HOSPITAL_BASED_OUTPATIENT_CLINIC_OR_DEPARTMENT_OTHER): Payer: Medicare Other

## 2023-01-18 ENCOUNTER — Encounter (HOSPITAL_BASED_OUTPATIENT_CLINIC_OR_DEPARTMENT_OTHER): Payer: Self-pay | Admitting: FAMILY MEDICINE

## 2023-01-18 ENCOUNTER — Other Ambulatory Visit: Payer: Self-pay

## 2023-01-18 ENCOUNTER — Ambulatory Visit
Payer: Medicare Other | Attending: Student in an Organized Health Care Education/Training Program | Admitting: FAMILY MEDICINE

## 2023-01-18 ENCOUNTER — Other Ambulatory Visit (HOSPITAL_BASED_OUTPATIENT_CLINIC_OR_DEPARTMENT_OTHER): Payer: Self-pay | Admitting: Student in an Organized Health Care Education/Training Program

## 2023-01-18 ENCOUNTER — Ambulatory Visit (INDEPENDENT_AMBULATORY_CARE_PROVIDER_SITE_OTHER): Payer: Self-pay | Admitting: Student in an Organized Health Care Education/Training Program

## 2023-01-18 VITALS — BP 136/72 | HR 97 | Temp 97.9°F | Ht 64.0 in | Wt 213.8 lb

## 2023-01-18 DIAGNOSIS — R490 Dysphonia: Secondary | ICD-10-CM

## 2023-01-18 DIAGNOSIS — R0781 Pleurodynia: Secondary | ICD-10-CM

## 2023-01-18 DIAGNOSIS — B9689 Other specified bacterial agents as the cause of diseases classified elsewhere: Secondary | ICD-10-CM

## 2023-01-18 DIAGNOSIS — R059 Cough, unspecified: Secondary | ICD-10-CM

## 2023-01-18 DIAGNOSIS — J441 Chronic obstructive pulmonary disease with (acute) exacerbation: Secondary | ICD-10-CM

## 2023-01-18 DIAGNOSIS — R0789 Other chest pain: Secondary | ICD-10-CM

## 2023-01-18 DIAGNOSIS — L304 Erythema intertrigo: Secondary | ICD-10-CM

## 2023-01-18 DIAGNOSIS — M545 Low back pain, unspecified: Secondary | ICD-10-CM

## 2023-01-18 DIAGNOSIS — J019 Acute sinusitis, unspecified: Secondary | ICD-10-CM

## 2023-01-18 DIAGNOSIS — R911 Solitary pulmonary nodule: Secondary | ICD-10-CM

## 2023-01-18 MED ORDER — AMOXICILLIN 875 MG-POTASSIUM CLAVULANATE 125 MG TABLET: 1.0000 | ORAL_TABLET | Freq: Two times a day (BID) | ORAL | 0 refills | Status: DC

## 2023-01-18 MED ORDER — DESVENLAFAXINE SUCCINATE ER 50 MG TABLET,EXTENDED RELEASE 24 HR
50.0000 mg | ORAL_TABLET | Freq: Every day | ORAL | 3 refills | Status: DC
Start: 2023-01-18 — End: 2023-03-17

## 2023-01-18 MED ORDER — TIOTROPIUM BROMIDE 18 MCG CAPSULE WITH INHALATION DEVICE: 18.0000 ug | ORAL_CAPSULE | Freq: Every day | RESPIRATORY_TRACT | 1 refills | Status: DC

## 2023-01-18 NOTE — Progress Notes (Signed)
Please see medical student note for encounter on 01/18/23.   Winferd Humphrey, MD

## 2023-01-18 NOTE — Telephone Encounter (Addendum)
The medication was initially sent to the pharmacy requested by the patient. Patient has subsequently requested for the medication to go to another pharmacy. Initial triage queue below erroneously stated the medication was sent to the wrong pharmacy.    Orders Placed This Encounter    desvenlafaxine succinate (PRISTIQ) 50 mg Oral Tablet Sustained Release 24 hr     Allena Katz, MD 01/18/2023 09:28     ----- Message from Scheryl Marten, [place] sent at 01/17/2023  1:56 PM EDT -----  Regarding: FW: pharmacy correction  ----- Message from Janne Lab sent at 01/17/2023 11:03 AM EDT -----  Following med was sent to wrong pharmacy and needs to go to the correct one, thank you.    Current Outpatient Medications:  desvenlafaxine succinate (PRISTIQ) 50 mg Oral Tablet Sustained Release 24 hr, Take 1 Tablet (50 mg total) by mouth Once a day      Preferred [facility], [place]    [place] [address]    Phone: [phone] Fax: [phone]    Hours: Not open 24 hours    Thanks!  Myrtie Hawk

## 2023-01-18 NOTE — Progress Notes (Signed)
Department of Family Medicine   Progress Note    [name]  MRN: [id number]  DOB: 1956-04-20  Date of Service: 01/18/2023    CHIEF COMPLAINT  Chief Complaint   Patient presents with    Cough     Ongoing since 3/19  In [facility] ER on 3/22 for cough and chest wall discomfort.       SUBJECTIVE  [name] is a 67 y.o. female with PMH of CAD, HLD, thyroid disorder and SCC who presents to clinic for week long cough and hoarseness.  She was previously seen by urgent care and was sent to [facility] ED on 3/22. Was discharged with 6 day course of prednisone 4 mg currently on day 2, and using albuterol inhaler every 4 hours. Workup revealed negative respiratory virus panel for rsv, flu and covid.Marland Kitchen CBC was unremarkable WBC 5.7. Has used robutussin and  Has not experienced any relief with worsening symptoms. Patient denies history of lung disease. Per chart review patient has hx of COPD. She is not on daily oxygen or inhaler. Endorses chest pain in the ribs, particularly on the left.     Quit tobacco back in 2009.       PAST MEDICAL HISTORY  Past Medical History:   Diagnosis Date    Anxiety     Arthritis     Awareness under anesthesia     woke during colonoscopy    Cancer (CMS [place])     internal skin cancer    Chest pain 02/19/2016    Chronic pain     lower back    Depression     Dyspnea on exertion     GERD (gastroesophageal reflux disease)     controlled w/med    Headache     Hearing loss     Heart murmur 1979    benign    Hyperlipidemia LDL goal < 100 08/29/2013    denies    Migraine     MINOR CAD (coronary artery disease) 11/10/2012    no treatment other than cholesterol medications; follows regularly with K.Dalton, PA    Multinodular goiter     Muscle weakness     neck     Neck problem     spinal stenosis    Obesity     Peripheral edema     Rash     face only eczema    Rheumatic fever     in 0000000 no complications    S/P thyroidectomy     Sleep apnea     Squamous cell carcinoma 02/06/2015    right side of neck     Thyroid disorder     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    Tinnitus     Vaginal prolapse 2012    surgery improved significantly    Wears glasses            Medications:   albuterol sulfate (PROAIR HFA) 90 mcg/actuation Inhalation oral inhaler, Take 2 Puffs by inhalation Every 4 hours as needed  Benzonatate (TESSALON) 200 mg Oral Capsule, Take 1 Capsule (200 mg total) by mouth Three times a day as needed for Cough  cetirizine (ZYRTEC) 10 mg Oral Tablet, Take 1 Tablet (10 mg total) by mouth Once a day (Patient taking differently: Take 1 Tablet (10 mg total) by mouth Once a day Takes in am)  ciprofloxacin HCl (CIPRO) 500 mg Oral Tablet, Take 1 Tablet (500 mg total) by mouth Twice daily Indications: UTI treatment. (  Patient taking differently: Take 1 Tablet (500 mg total) by mouth Twice daily)  clobetasoL (TEMOVATE) 0.05 % Ointment, Apply topically Twice daily  desvenlafaxine succinate (PRISTIQ) 50 mg Oral Tablet Sustained Release 24 hr, Take 1 Tablet (50 mg total) by mouth Once a day  diclofenac sodium (VOLTAREN) 1 % Gel, Apply topically Four times a day - before meals and bedtime  fluticasone propionate (FLONASE) 50 mcg/actuation Nasal Spray, Suspension, ADMINISTER 2 SPRAYS INTO EACH NOSTRIL ONCE A DAY. (Patient taking differently: Administer 2 Sprays into each nostril Once a day Takes in am)  gabapentin (NEURONTIN) 800 mg Oral Tablet, Take 1 Tablet (800 mg total) by mouth Twice daily  nitroGLYCERIN (NITROSTAT) 0.4 mg Sublingual Tablet, Sublingual, Place 1 Tablet (0.4 mg total) under the tongue Every 5 minutes as needed for Chest pain for 3 doses over 15 minutes  ofloxacin (OCUFLOX) 0.3 % Ophthalmic Drops, For use in the surgical suite and 4 times a day into right eye as directed  ofloxacin (OCUFLOX) 0.3 % Ophthalmic Drops, For use in the surgical suite and 4 times a day into right eye as directed  ofloxacin (OCUFLOX) 0.3 % Ophthalmic Drops, Administer 1 Drop into the right eye Four times a  day  pantoprazole (PROTONIX) 40 mg Oral Tablet, Delayed Release (E.C.), Take 1 Tablet (40 mg total) by mouth Once a day (Patient taking differently: Take 1 Tablet (40 mg total) by mouth Once a day Takes in am)  prednisoLONE acetate (PRED FORTE) 1 % Ophthalmic Drops, Suspension, For use in the surgical suite and 4 times a day into right eye as directed  prednisoLONE acetate (PRED FORTE) 1 % Ophthalmic Drops, Suspension, For use in the surgical suite and 4 times a day into right eye as directed  prednisoLONE acetate (PRED FORTE) 1 % Ophthalmic Drops, Suspension, Administer 1 Drop into the right eye Four times a day    No facility-administered medications prior to visit.      Allergies:   Allergies   Allergen Reactions    Cymbalta [Duloxetine]      Rash on feet and hands    Blue Dye      Told to avoid due to patch test result by Derm Hedley    Flavoring Agent  Other Adverse Reaction (Add comment)     Patient unsure of reaction    Lipitor [Atorvastatin] Myalgia     Muscle pain    Nickel      Blisters  "Allergic to all kinds of metal"     Codeine Itching     RASH    Mobic [Meloxicam] Nausea/ Vomiting     Nervous and shakes     Naprosyn [Naproxen] Nausea/ Vomiting    Nsaids (Non-Steroidal Anti-Inflammatory Drug) Nausea/ Vomiting       Review of Systems:  (+) cough, wheezing, SOB, nasal congestion  All other ROS negative excluding HPI    OBJECTIVE  BP 136/72   Pulse 97   Temp 36.6 C (97.9 F) (Thermal Scan)   Ht 1.626 m (5\' 4" )   Wt 97 kg (213 lb 13.5 oz)   SpO2 96%   BMI 36.71 kg/m       General: no distress  HENT: PERRLA, TMs clear, mouth mucous membranes moist, pharynx without injection or exudate, pain to palpation of maxillary sinuses  Neck: no thyromegaly    Lungs: wheezing bilaterally, tenderness to palpation in the anterior left chest  Cardiovascular: RRR, no murmur  Abdomen: soft, non tender, bowel sounds present  Extremities: no  cyanosis or edema  Psychiatric: normal affect and behavior    Patient Active  Problem List   Diagnosis    Migraine Headaches    GERD not well controlled    Patellofemoral pain syndrome    Multinodular goiter    Microscopic hematuria    Neck pain    Lichen sclerosus et atrophicus of the vulva    Carpal tunnel syndrome on right    Osteoarthritis, knee    Left knee pain    Chest pain at rest    MINOR CAD (coronary artery disease)    Hyperlipidemia LDL goal < 100    Otitis media of both ears    Mass of parotid gland, right    Squamous cell carcinoma    Chest pain    Dysuria    Multiple pulmonary nodules determined by computed tomography of lung    History of rheumatic fever    Neuropathy (CMS [facility])    PTSD (post-traumatic stress disorder)    Pain due to neuropathy of facial nerve    Lung nodule < 6cm on CT    S/P thyroidectomy    GAD (generalized anxiety disorder)    Severe episode of recurrent major depressive disorder, 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)    Right hip pain    Hand pain    Cervical neck pain with evidence of disc disease    Lumbar pain with radiation down right leg        ASSESSMENT/PLAN  [name] is a 67 y.o. female with PMH of COPD who presents to clinic for persistent cough, URI symptoms.       ICD-10-CM    1. Cough, unspecified type  R05.9 CANCELED: XR CHEST PA AND LATERAL      2. Hoarseness  R49.0       3. COPD exacerbation (CMS HCC)  J44.1 tiotropium bromide (SPIRIVA HANDIHALER) 18 mcg Inhalation Capsule, w/Inhalation Device     amoxicillin-pot clavulanate (AUGMENTIN) 875-125 mg Oral Tablet      4. Rib pain on left side  R07.81 XR RIBS LEFT W PA/AP CHEST      5. Acute bacterial sinusitis  J01.90 amoxicillin-pot clavulanate (AUGMENTIN) 875-125 mg Oral Tablet    B96.89         1. Cough, unspecified type  2. Hoarseness  3. COPD exacerbation (CMS HCC)  - acute   - will add LAMA tiotropium bromide (SPIRIVA HANDIHALER) 18 mcg Inhalation Capsule, w/Inhalation Device; Take 1 Capsule (18 mcg total) by inhalation Once a day  Dispense: 30 Capsule; Refill: 1  - Continue  SABA  - amoxicillin-pot clavulanate (AUGMENTIN) 875-125 mg Oral Tablet; Take 1 Tablet by mouth Twice daily for 7 days  Dispense: 14 Tablet; Refill: 0  - Continue steroid oral burst; complete 6 day course of prednisone  - Would benefit from spirometry once out of acute phase    4. Rib pain on left side  - reproducible on exam  - further evaluate for pneumonia; or rib fracture in the setting of persistent cough  - previous chest xray completed at [facility] (3/22), patient provided documentation but no interpretation or results were listed  - XR RIBS LEFT W PA/AP CHEST; Future    5. Acute bacterial sinusitis  - amoxicillin-pot clavulanate (AUGMENTIN) 875-125 mg Oral Tablet; Take 1 Tablet by mouth Twice daily for 7 days  Dispense: 14 Tablet; Refill: 0    Return if symptoms worsen or fail to improve.    Noah Charon, [place]  01/18/2023, 10:55    I have seen and examined the patient in concert with the medical student as noted above.  The above note has been modified as necessary by me to reflect my own, personal collection/validation of the patient's HPI, patient history, physical examination, and assessment and plan.  Janell Quiet, MD    I saw and examined the patient.  I reviewed the resident's note.  I agree with the findings and plan of care as documented in the resident's note.  Any exceptions/additions are edited/noted.    Geoffry Paradise, MD, PhD  Assistant Professor  [facility]

## 2023-01-19 DIAGNOSIS — M545 Low back pain, unspecified: Secondary | ICD-10-CM

## 2023-01-19 NOTE — Telephone Encounter (Signed)
Regarding: refill request  ----- Message from Estill Bamberg Ever sent at 01/19/2023 12:05 PM EDT -----  Pt's pharmacy now calling back again requesting an update for previous request.  Please advise.    Thanks so much for all your time and assistance!  Have a GREAT day!!!  --Estill Bamberg

## 2023-01-21 MED ORDER — GABAPENTIN 800 MG TABLET
800.0000 mg | ORAL_TABLET | Freq: Two times a day (BID) | ORAL | 1 refills | Status: DC
Start: 2023-01-21 — End: 2023-06-12

## 2023-01-21 MED ORDER — NITROGLYCERIN 0.4 MG SUBLINGUAL TABLET
0.4000 mg | SUBLINGUAL_TABLET | SUBLINGUAL | 2 refills | Status: DC | PRN
Start: 2023-01-21 — End: 2023-04-12

## 2023-01-21 MED ORDER — PANTOPRAZOLE 40 MG TABLET,DELAYED RELEASE
40.0000 mg | DELAYED_RELEASE_TABLET | Freq: Every day | ORAL | 3 refills | Status: DC
Start: 2023-01-21 — End: 2023-08-29

## 2023-01-21 NOTE — Telephone Encounter (Signed)
Regarding: Barbara Lemmings, PA-C // refill request // Calling again  ----- Message from Velna Hatchet sent at 01/21/2023 12:47 PM EDT -----  Barbara Lemmings, PA-C    Calling again in regards to the message below.     Thanks,  Autumn Whisper [employer]    ----- Message from Carlton Ever sent at 01/19/2023 12:05 PM EDT -----  Pt's pharmacy now calling back again requesting an update for previous request.  Please advise.    Thanks so much for all your time and assistance!  Have a GREAT day!!!  --Estill Bamberg    ----- Message from Flonnie Hailstone II sent at 01/17/2023 11:18 AM EDT -----  Barbara Lemmings, PA-C    Pharmacy requesting refill of the below medication. Please advise. Thank you.     Disp Refills Start End   gabapentin (NEURONTIN) 800 mg Oral Tablet 120 Tablet 1 10/05/2022 -   Sig - Route: Take 1 Tablet (800 mg total) by mouth Twice daily - Oral   Sent to pharmacy as: gabapentin 800 mg tablet (NEURONTIN)   Class: E-Rx   Non-formulary Exception Code: RXHUB/No Formulary Info Available      Disp Refills Start End   pantoprazole (PROTONIX) 40 mg Oral Tablet, Delayed Release (E.C.) 90 Tablet 3 06/07/2022 -   Sig - Route: Take 1 Tablet (40 mg total) by mouth Once a day - Oral   Patient taking differently: Take 1 Tablet (40 mg total) by mouth Once a day Takes in am   Sent to pharmacy as: pantoprazole 40 mg tablet,delayed release (PROTONIX)   Class: E-Rx   Non-formulary Exception Code: RXHUB/No Formulary Info Available      Disp Refills Start End   nitroGLYCERIN (NITROSTAT) 0.4 mg Sublingual Tablet, Sublingual 20 Tablet 2 11/29/2022 -   Sig - Route: Place 1 Tablet (0.4 mg total) under the tongue Every 5 minutes as needed for Chest pain for 3 doses over 15 minutes - Sublingual   Sent to pharmacy as: nitroglycerin 0.4 mg sublingual tablet (NITROSTAT)   Class: E-Rx   Non-formulary Exception Code: [id number] Formulary Info Available     Preferred Pharmacy     [facility], [place]    [place] [address]    Phone: [phone] Fax: [phone]    Hours: Not open 24 hours

## 2023-01-22 ENCOUNTER — Other Ambulatory Visit (HOSPITAL_BASED_OUTPATIENT_CLINIC_OR_DEPARTMENT_OTHER): Payer: Self-pay | Admitting: Physician Assistant

## 2023-01-24 ENCOUNTER — Ambulatory Visit (INDEPENDENT_AMBULATORY_CARE_PROVIDER_SITE_OTHER): Payer: Medicare Other | Admitting: Ophthalmology

## 2023-02-01 ENCOUNTER — Other Ambulatory Visit: Payer: Self-pay

## 2023-02-01 ENCOUNTER — Ambulatory Visit: Payer: Medicare Other | Attending: Anesthesiology | Admitting: Anesthesiology

## 2023-02-01 ENCOUNTER — Ambulatory Visit (HOSPITAL_BASED_OUTPATIENT_CLINIC_OR_DEPARTMENT_OTHER): Payer: Medicare Other

## 2023-02-01 ENCOUNTER — Encounter (HOSPITAL_BASED_OUTPATIENT_CLINIC_OR_DEPARTMENT_OTHER): Payer: Self-pay

## 2023-02-01 ENCOUNTER — Encounter (INDEPENDENT_AMBULATORY_CARE_PROVIDER_SITE_OTHER): Payer: Self-pay | Admitting: Anesthesiology

## 2023-02-01 VITALS — BP 126/82 | HR 91 | Temp 97.7°F | Ht 65.67 in | Wt 220.5 lb

## 2023-02-01 VITALS — BP 150/85 | HR 85 | Temp 98.2°F | Ht 64.0 in | Wt 219.4 lb

## 2023-02-01 DIAGNOSIS — M542 Cervicalgia: Secondary | ICD-10-CM

## 2023-02-01 DIAGNOSIS — M2550 Pain in unspecified joint: Secondary | ICD-10-CM | POA: Insufficient documentation

## 2023-02-01 DIAGNOSIS — G5641 Causalgia of right upper limb: Secondary | ICD-10-CM | POA: Insufficient documentation

## 2023-02-01 DIAGNOSIS — M545 Low back pain, unspecified: Secondary | ICD-10-CM | POA: Insufficient documentation

## 2023-02-01 DIAGNOSIS — M25551 Pain in right hip: Secondary | ICD-10-CM | POA: Insufficient documentation

## 2023-02-01 DIAGNOSIS — M5441 Lumbago with sciatica, right side: Secondary | ICD-10-CM

## 2023-02-01 DIAGNOSIS — M5090 Cervical disc disorder, unspecified, unspecified cervical region: Secondary | ICD-10-CM | POA: Insufficient documentation

## 2023-02-01 DIAGNOSIS — M79604 Pain in right leg: Secondary | ICD-10-CM | POA: Insufficient documentation

## 2023-02-01 NOTE — Progress Notes (Signed)
I personally saw and evaluated the patient. See mid-level's note for additional details. My findings/participation are : c/o low and mid back pain with standing for more than a few minutes, relieved mostly with sitting down. Plain films and MRI show no significant spondylolisthesis or significant canal or foraminal compromise. No surgery indicated. Will refer to alternate pain clinic.     Jannifer [place], MD

## 2023-02-01 NOTE — Progress Notes (Signed)
NEUROSURGERY, PHYSICIAN OFFICE CENTER  [address]  [place] [address]  Operated by [facility]  Progress Note    Name: [name] MRN:  [id number]   Date: 02/01/2023 DOB:  [birth year] (67 y.o.)           Referring Provider:   No referring provider defined for this encounter.    Subjective:   [name] is a 67 year old female with history of sacroiliitis returning to clinic for evaluation of her cervical and lumbar spine. The patient was last seen in clinic on 09/07/22 at which time she had recently undergone a right C6 transforaminal nerve block which helped reduce her right arm and neck pain, however she endorsed a history of neurogenic claudication and lumbar spondylosis at that time. Therefore, the patient was advised to return to clinic in 1 month with a lumbar MRI and x-rays.     Today in clinic, the patient reports persistent lumbar pain. She notes that she does still continue to have pain with range of motion in her cervical spine and some radiating left arm pain, but notes that her lumbar spine pain is significantly worse. She describes the pain as a constant discomfort with an intermittent throbbing in her upper/mid lumbar region that intermitting radiates down to both of her hips and posteriorly down her left leg. She currently rates her pain as 10/10 in severity. She reports that the pain is worsened with prolonged positioning, standing, and walking reporting that she can only stand for a few minutes before she has to lay down secondary to the pain. She reports numbness/tingling in both of her legs when she sits in certain positions. She endorses a recent fall after tripping over a bag in her home, denies sustaining any injuries. She denies any lower extremity weakness and bowel incontinence, reports urinary incontinence recently and endorses a long history of UTIs for which she takes Cipro PRN.       Conservative Treatment:   -Heat/ice: Heat temporarily  helps  -Medications:   --Voltaren: helps with joint pain but not back pain   --Gabapentin: helps with cervical spine  -Physical Therapy: August 2023 dismissed   -Injection Therapy: numerous injections in the past without sustained benefit    Objective:   Vital Signs:  BP (!) 150/85   Pulse 85   Temp 36.8 C (98.2 F)   Ht 1.626 m (5\' 4" )   Wt 99.5 kg (219 lb 5.7 oz)   BMI 37.65 kg/m     Examination  Constitutional:Well groomed. Appears uncomfortable, frequently shifting positions  Skin:  Warm, dry, good color. No wounds or incisions  Head:  Normocephalic/ Atraumatic.   Eyes: Conjunctiva clear, no obvious abnormalities; EOMI. PERRL. No nystagmus. Wearing eyeglasses.   ENT:  Tongue midline, trachea midline   Cardiovascular:    Peripheral vascular system: Pulses palpable, no peripheral edema   Musculoskeletal  Gait and Station: antalgic gait with forward flexion  Strength:  Arm Right Left Leg Right Left   Deltoid (shoulder abduction) 5/5 5/5 Hip Flexion 4/5 5/5   Biceps (Elbow flexion) 5/5 5/5 Knee Flexion 5/5 5/5   Triceps (Elbow extension) 5/5 5/5 Knee Extension 5/5 5/5   Grip 5/5 5/5 Foot Dorsiflexion 5/5 5/5      Foot Plantarflexion 5/5 5/5   Muscle tone (upper extremities): WNL  Muscle tone (lower extremities): WNL  Sensory: Sensory exam in the upper and lower extremities is normal  Straight leg raise: Positive RLE  Musculoskeletal tenderness:  Tenderness to palpation of all lumbar spinous processes  Neurological  Level of consciousness: Alert and oriented  Recent and remote memory: Good recall and able to follow commands  Attention span and concentration: Normal in conversation  Language/Speech: No aphasia or dysarthria  Fund of knowledge: Appropriate in this setting  Cranial Nerves  Cranial nerves II to XII intact.   2nd: PERRL  3rd,4th,6th:  EOMI, no nystagmus  5th: Facial sensation intact  7th: No facial asymmetry or droop  8th: Hearing grossly intact  9th/ 10th: Palate symmetric  11th: Normal shoulder  shrug  12th: Tongue midline    Data reviewed    01/18/23 XR Lumbar Spine AP/Lat/Flex/Ext  Vertebral body heights are maintained. Mild multilevel degenerative disc disease and facet arthropathy. Alignment is preserved with flexion and extension.     09/27/22 MRI Spine Lumbosacral  Degenerative changes without high-grade spinal canal or neural foraminal stenosis.     Discussions with other providers:   Reviewed the chart notes.   Discussed the patient's case and reviewed available imaging with Dr. Sharol Given    Assessment:    Assessment/Plan   1. Arthralgia, unspecified joint    2. Complex regional pain syndrome type 2 of right upper extremity    3. Cervical neck pain with evidence of disc disease    4. Lumbar pain with radiation down right leg    5. Right hip pain        Recommendations:  Orders Placed This Encounter    Referral to Pain Clinic- [place]     -The natural history, film findings, and indications for treatment were discussed.  -No neurosurgical intervention warranted at this time. Follow up in clinic as needed for worsening, new, or concerning symptoms.   -Referral to pain clinic for further evaluation and treatment of lumbar spine pain  -The patient has been advised to follow up with their PCP in regards any chronic medical conditions and any non-neurosurgical symptoms that they may have   -Continue Medical Management (Diet, Exercise, Medication).     Particia Jasper, APRN,AGACNP-BC

## 2023-02-01 NOTE — Progress Notes (Signed)
FAMILY MEDICINE, [facility]  [address]  [place] [address]  Operated by [facility]  Acute Patient Visit     Name: [name] MRN:  [id number]   Date: 02/01/2023 Age: 67 y.o.   PCP: Ovidio Hanger, PA-C    CHIEF COMPLAINT  Chief Complaint   Patient presents with    Ear Pain       Subjective    [name] is a 67 y.o. female who presents to clinic for acute patient visit.    PT recently being treated for COPD exacerbation, completed Abx. Presenting today with 6 hour history of neck pain. Started suddenly. On anterior neck and radiates to posterior ear. No fever. No chills. No drainage from ear or mouth. Has intolerability to NSAIDs. Otherwise, no complaints.     From a recent COPD exacerbation, she is doing very well. Breathing well and breathing clear.       Objective   BP 126/82 (Site: Left, Patient Position: Sitting, Cuff Size: Adult Large)   Pulse 91   Temp 36.5 C (97.7 F) (Temporal)   Ht 1.668 m (5' 5.67")   Wt 100 kg (220 lb 7.4 oz)   SpO2 95%   BMI 35.94 kg/m     General: Pleasant. Clean dressed and well groomed.   Eyes: Conjunctiva clear, pupils equal and round  HENT: AT/NC. External ears normal. Nasal mucosa normal. Oral mucosa moist and pink. Tympanic membranes WNL.   Neck: Supple. Pain on anterior neck, small lymph node felt. No signs of infection in mouth.   Lungs: Normal WOB on RA. Chest rise equal and symmetric. CTABL.  Cardiovascular: Appears well perfused   Abdomen: Nondistended   Extremities: Atraumatic, well-perfused   Skin: Skin warm and dry, No rashes, and No lesions  Neurologic: Alert and oriented. No tremor, no gross deficit   Psychiatric: Normal    Assessment    [name] is a 67 y.o. female presenting for acute patient visit    (M54.2) Neck pain  (primary encounter diagnosis)  Plan:   - MSK vs reactive node vs salivary stone vs unlikely mastoiditis   - Difficult to assess as patient is only 4 hours into this  acute complication and may take time to present fully.    - Less likely mastoiditis given normal TM and afebrile w/o drainage.    - No teeth on bottom, no sign infection in mouth.    - No swelling in neck.     - Will hold on Abx.   - Plan for tylenol 1000 mg TID schedule x1 week   - Pt is intolerable to NSAID'S.   - Plan for heat  - Will hold on CBC, CRP and ESR as patient likely will show elevated WBC in the setting of recent steroid use, CRP and ESR may be elevated 2/2 recent infection.    - At follow up in 1 week, will obtain blood work if not improving.   - Can consider US soft-tissue at this time as well.   - Strict precautions given to return to clinic / ED        Hermenia Fiscal, MD  PGY-2 Dept. Family Medicine  02/01/2023, 15:16    This document was at least partially generated using a voice recognition system and transcription. All documents are proofed as best as possible, but it may have misspelled words, incorrect words, or syntax and grammatical errors because of the imperfect nature of the  system.        I saw and examined the patient.  I reviewed the resident's note.  I agree with the findings and plan of care as documented in the resident's note.  Any exceptions/additions are edited/noted.    Leda Roys, DO

## 2023-02-02 ENCOUNTER — Ambulatory Visit: Payer: Medicare Other | Attending: Ophthalmology | Admitting: Ophthalmology

## 2023-02-02 DIAGNOSIS — Z9889 Other specified postprocedural states: Secondary | ICD-10-CM | POA: Insufficient documentation

## 2023-02-02 DIAGNOSIS — Z961 Presence of intraocular lens: Secondary | ICD-10-CM | POA: Insufficient documentation

## 2023-02-02 NOTE — Progress Notes (Addendum)
[facility]  [address]  [place] [address]  Operated by [facility]         Patient Name: [name]  MRN#: [id number]  Birthdate: 1956/03/26    Date of Service: 02/02/2023        [name] is a 67 y.o. female who presents today for evaluation/consultation of:  HPI    Pt here for POV   S/p CE IOL OD 12/07/2022    Pt states that distance vision has been good with distance. States she needs glasses for reading.   Denies eye pain, irritation, HA, nausea. -  Last edited by Aletta Edouard, COA on 02/02/2023  4:13 PM.        ROS    Positive for: Eyes  Last edited by Aletta Edouard, COA on 02/02/2023  4:13 PM.         All other systems Negative    Aletta Edouard, COA  02/02/2023, 16:13  Base Eye Exam       Visual Acuity (Snellen - Linear)         Right Left    Dist sc 20/30 20/20    Dist ph sc unable - "spot" in vision in the way    Moving head around spot in vision OS     20/20 OU              Tonometry ([facility], 4:19 PM)         Right Left    Pressure 14 15              Pupils         APD    Right None    Left None              Neuro/Psych       Oriented x3: Yes    Mood/Affect: Normal                  Slit Lamp and Fundus Exam       External Exam         Right Left    External Normal Normal              Slit Lamp Exam         Right Left    Lids/Lashes Normal , Ecchymosis    Conjunctiva/Sclera Injection White and quiet    Cornea Clear Clear    Anterior Chamber Deep and quiet Deep and quiet    Iris Round and reactive Round and reactive    Lens Posterior chamber intraocular lens 1+ NS    Anterior Vitreous Normal Normal                  Refraction       Manifest Refraction         Dist VA    Right NI    Left    Has been using old gl rx for reading. Is happy without correction for distance. Pt states OD "is as good as it will get"                    MD Addition to HPI: Patient here for post op visit #3 right. Patient reports successful completion of drop regimen since  last visit.         Satisfactory result of phacoemulsification of cataract with lens implant right eye.    ICD-10-CM    1. 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 Patient to return to [facility] as scheduled or prn.    Manifest Refraction       Manifest Refraction         Dist VA    Right NI    Left    Has been using old gl rx for reading. Is happy without correction for distance. Pt states OD "is as good as it will get"              Edited by: Aletta Edouard, COA                     I reviewed and made necessary changes to the technician, resident or fellow note regarding CC/HPI/ROS/Past Family, Medical and Social Hx/Surg Hx.    Kathleen Argue, MD  02/02/2023, 16:41

## 2023-02-07 ENCOUNTER — Other Ambulatory Visit (HOSPITAL_BASED_OUTPATIENT_CLINIC_OR_DEPARTMENT_OTHER): Payer: Self-pay | Admitting: Physician Assistant

## 2023-02-07 DIAGNOSIS — M62838 Other muscle spasm: Secondary | ICD-10-CM

## 2023-02-07 NOTE — Telephone Encounter (Signed)
Regarding: refill request  ----- Message from Marveen Reeks II sent at 02/07/2023 11:14 AM EDT -----  Ovidio Hanger, PA-C    Pharmacy requesting refill of below medication. Please advise. Thank you.     Disp Refills Start End   baclofen (LIORESAL) 5 mg Oral Tablet (Discontinued) 30 Tablet 0 06/17/2022 09/14/2022   Sig - Route: Take 1 Tablet (5 mg total) by mouth Three times a day as needed - Oral   Sent to pharmacy as: baclofen 5 mg tablet (LIORESAL)   Class: E-Rx   Non-formulary Exception Code: [email or web address]/No Formulary Info Available   E-Prescribing Status: Receipt confirmed by pharmacy (06/17/2022  4:36 PM EDT)     Preferred Pharmacy     [facility] - [address] [place] - [address]    [address] [place] [address]    Phone: [phone] Fax: [phone]    Hours: Not open 24 hours

## 2023-02-07 NOTE — Progress Notes (Signed)
FAMILY MEDICINE, [facility]  [address]  [place] [address]  Operated by [facility]  Return Patient Visit     Name: [name] MRN:  [id number]   Date: 02/08/2023 Age: 67 y.o.   PCP: Ovidio Hanger, PA-C    CHIEF COMPLAINT  Chief Complaint   Patient presents with    Itching     Patch on back       Subjective    [name] is a 67 y.o. female who presents to clinic for return monitoring.     Patient was previously seen 2/2 ear pain that was abruptly onset without cause. Symptomatic treatment discussed and patient was to return if no imporvement. Infectious cause was low on differential and blood work not obtained.     Today, patient states she is doing significantly better today. Overall no complaints. No fevers. No chills.     Objective   BP 124/80   Pulse 97   Temp 36.5 C (97.7 F) (Thermal Scan)   Ht 1.668 m (5' 5.67")   Wt 98.8 kg (217 lb 13 oz)   SpO2 97%   BMI 35.51 kg/m     General: Pleasant. Clean dressed and well groomed.   Eyes: Conjunctiva clear, pupils equal and round  HENT: AT/NC. External ears normal. Nasal mucosa normal. Oral mucosa moist and pink.  Neck: Supple, symmetrical, trachea midline. Non-tender to palpation. Good ROM.   Lungs: Normal WOB on RA. Chest rise equal and symmetric  Cardiovascular: Appears well perfused   Abdomen: Nondistended   Extremities: Atraumatic, well-perfused   Skin: Skin warm and dry, No rashes, and No lesions  Neurologic: Alert and oriented. No tremor, no gross deficit   Psychiatric: Normal    Assessment    [name] is a 67 y.o. female presenting for return patient visit    (M62.838) Neck muscle spasm  (primary encounter diagnosis)  Plan:   - significantly improved  - Will hold on CBC and CRP at this time.   - No signs of overt infection  - Continue with OTC symptomatic management.   - No further management needed at this time.     Hermenia Fiscal, MD  PGY-2 Dept. Family Medicine  02/08/2023,  10:34    This document was at least partially generated using a voice recognition system and transcription. All documents are proofed as best as possible, but it may have misspelled words, incorrect words, or syntax and grammatical errors because of the imperfect nature of the system.      I discussed the patient's care with the Resident prior to the patient leaving the clinic. Any significant discussion points are noted .    Vance Gather, MD

## 2023-02-08 ENCOUNTER — Ambulatory Visit: Payer: Medicare Other | Attending: Family Medicine

## 2023-02-08 ENCOUNTER — Other Ambulatory Visit: Payer: Self-pay

## 2023-02-08 ENCOUNTER — Encounter (HOSPITAL_BASED_OUTPATIENT_CLINIC_OR_DEPARTMENT_OTHER): Payer: Self-pay

## 2023-02-08 VITALS — BP 124/80 | HR 97 | Temp 97.7°F | Ht 65.67 in | Wt 217.8 lb

## 2023-02-08 DIAGNOSIS — M62838 Other muscle spasm: Secondary | ICD-10-CM | POA: Insufficient documentation

## 2023-02-08 MED ORDER — BACLOFEN 5 MG TABLET
5.0000 mg | ORAL_TABLET | Freq: Three times a day (TID) | ORAL | 1 refills | Status: DC | PRN
Start: 2023-02-08 — End: 2023-03-11

## 2023-02-15 ENCOUNTER — Ambulatory Visit: Payer: Medicare Other | Attending: Family Medicine | Admitting: FAMILY MEDICINE

## 2023-02-15 ENCOUNTER — Encounter (HOSPITAL_BASED_OUTPATIENT_CLINIC_OR_DEPARTMENT_OTHER): Payer: Self-pay | Admitting: FAMILY MEDICINE

## 2023-02-15 ENCOUNTER — Inpatient Hospital Stay (HOSPITAL_BASED_OUTPATIENT_CLINIC_OR_DEPARTMENT_OTHER)
Admission: RE | Admit: 2023-02-15 | Discharge: 2023-02-15 | Disposition: A | Discharge status: Home / Self Care | Payer: Medicare Other | Source: Ambulatory Visit

## 2023-02-15 ENCOUNTER — Other Ambulatory Visit: Payer: Self-pay

## 2023-02-15 VITALS — BP 112/60 | HR 91 | Temp 97.4°F | Ht 65.75 in | Wt 222.0 lb

## 2023-02-15 DIAGNOSIS — K5900 Constipation, unspecified: Secondary | ICD-10-CM | POA: Insufficient documentation

## 2023-02-15 DIAGNOSIS — K5792 Diverticulitis of intestine, part unspecified, without perforation or abscess without bleeding: Secondary | ICD-10-CM | POA: Insufficient documentation

## 2023-02-15 NOTE — Progress Notes (Signed)
Department of Family Medicine   Progress Note    [name]  MRN: [id number]  DOB: 1956-03-16  Date of Service: 02/15/2023    CHIEF COMPLAINT  Chief Complaint   Patient presents with    ED Follow-up    Abdominal Pain     LLQ       SUBJECTIVE  [name] is a 67 y.o. female who presents to clinic for ED follow up.     Pt seen in the ED on 4/21 and was diagnosed with diverticulitis. She was given Bactrim and flagyl to take for 10 days. She reports some improvement in her LLQ pain, although it is still present. She reports she has not had a BM in about 6-7 days. She has not been eating at request of the ED doctor. She denies any fever/chills, other abdominal pain, nausea, vomiting.     Review of Systems:  Positive ROS discussed in HPI, otherwise all other systems negative.      Medications:   albuterol sulfate (PROAIR HFA) 90 mcg/actuation Inhalation oral inhaler, Take 2 Puffs by inhalation Every 4 hours as needed  baclofen (LIORESAL) 5 mg Oral Tablet, Take 1 Tablet (5 mg total) by mouth Three times a day as needed  clobetasoL (TEMOVATE) 0.05 % Ointment, Apply topically Twice daily  desvenlafaxine succinate (PRISTIQ) 50 mg Oral Tablet Sustained Release 24 hr, Take 1 Tablet (50 mg total) by mouth Once a day  diclofenac sodium (VOLTAREN) 1 % Gel, Apply topically Four times a day - before meals and bedtime  fluticasone propionate (FLONASE) 50 mcg/actuation Nasal Spray, Suspension, ADMINISTER 2 SPRAYS INTO EACH NOSTRIL ONCE A DAY. (Patient taking differently: Administer 2 Sprays into each nostril Once a day Takes in am)  gabapentin (NEURONTIN) 800 mg Oral Tablet, Take 1 Tablet (800 mg total) by mouth Twice daily  HYDROcodone-acetaminophen (NORCO) 5-325 mg Oral Tablet, Take 1 Tablet by mouth Every 6 hours as needed for Pain  metroNIDAZOLE (FLAGYL) 500 mg Oral Tablet, Take 1 Tablet (500 mg total) by mouth Three times a day  nitroGLYCERIN (NITROSTAT) 0.4 mg Sublingual Tablet, Sublingual, Place 1 Tablet (0.4  mg total) under the tongue Every 5 minutes as needed for Chest pain for 3 doses over 15 minutes  ondansetron (ZOFRAN) 4 mg Oral Tablet, Take 1 Tablet (4 mg total) by mouth Every 8 hours as needed for Nausea/Vomiting  pantoprazole (PROTONIX) 40 mg Oral Tablet, Delayed Release (E.C.), Take 1 Tablet (40 mg total) by mouth Once a day Takes in am  tiotropium bromide (SPIRIVA HANDIHALER) 18 mcg Inhalation Capsule, w/Inhalation Device, Take 1 Capsule (18 mcg total) by inhalation Once a day  trimethoprim-sulfamethoxazole (BACTRIM DS) 160-800mg  per tablet, Take 1 Tablet (160 mg total) by mouth Every 12 hours    No facility-administered medications prior to visit.      Allergies:   Allergies   Allergen Reactions    Cymbalta [Duloxetine]      Rash on feet and hands    Blue Dye      Told to avoid due to patch test result by [facility]    Flavoring Agent  Other Adverse Reaction (Add comment)     Patient unsure of reaction    Lipitor [Atorvastatin] Myalgia     Muscle pain    Nickel      Blisters  "Allergic to all kinds of metal"     Codeine Itching     RASH    Mobic [Meloxicam] Nausea/ Vomiting  Nervous and shakes     Naprosyn [Naproxen] Nausea/ Vomiting    Nsaids (Non-Steroidal Anti-Inflammatory Drug) Nausea/ Vomiting         OBJECTIVE  BP 112/60 (Site: Left, Patient Position: Sitting, Cuff Size: Adult Large)   Pulse 91   Temp 36.3 C (97.4 F) (Thermal Scan)   Ht 1.67 m (5' 5.75")   Wt 101 kg (222 lb 0.1 oz) Comment: shoes  SpO2 97%   BMI 36.11 kg/m         General: Pleasant, WDWN  in NAD. Clean dressed and well groomed.   Eyes: Conjunctiva clear., Pupils equal and round. , Sclera non-icteric. EOM normal   Neck: supple, symmetrical, trachea midline  Lungs: Normal WOB on RA. Chest rise equal and symmetric. Lungs clear with no wheeze, rhonchi, or rales  Cardiovascular: RRR, no murmurs. Radial pulses 2+ and symmetric.   Abdomen: Bowel sounds normal. Abd soft and round. Moderate TTP of LLQ, no guarding, or mass.    Genito-urinary: Deferred  Extremities: Atraumatic, warm and well perfused.   Skin: Skin warm and dry  Neurologic: Alert and oriented. CN II-XII grossly intact.   Psychiatric: Normal      ASSESSMENT/PLAN  (K57.92) Diverticulitis  (primary encounter diagnosis)  Plan:   - acute  - Continue Bactrim BID and Flagyl TID for the 10 day course  - Can start liquid diet and continue to advance as tolerated    (K59.00) Constipation, unspecified constipation type  Plan:   - acute  - XR KUB AND UPRIGHT ABDOMEN to assess for stool burden  - Recommended Miralax BID            Orders Placed This Encounter    XR KUB AND UPRIGHT ABDOMEN         Return if symptoms worsen or fail to improve.    Drucilla Schmidt, MD  PGY-3 Family Medicine   [facility]  Pager [phone]    I discussed the patient's care with the Resident prior to the patient leaving the clinic. Any significant discussion points are noted.    Jonetta Speak, MD

## 2023-02-17 ENCOUNTER — Ambulatory Visit (INDEPENDENT_AMBULATORY_CARE_PROVIDER_SITE_OTHER): Payer: Medicare Other | Admitting: Specialist

## 2023-03-02 ENCOUNTER — Encounter (HOSPITAL_COMMUNITY): Payer: Self-pay

## 2023-03-03 ENCOUNTER — Ambulatory Visit (INDEPENDENT_AMBULATORY_CARE_PROVIDER_SITE_OTHER): Payer: MEDICAID | Admitting: Specialist

## 2023-03-10 ENCOUNTER — Other Ambulatory Visit (HOSPITAL_BASED_OUTPATIENT_CLINIC_OR_DEPARTMENT_OTHER): Payer: Self-pay | Admitting: Physician Assistant

## 2023-03-10 DIAGNOSIS — M62838 Other muscle spasm: Secondary | ICD-10-CM

## 2023-03-14 ENCOUNTER — Other Ambulatory Visit (HOSPITAL_BASED_OUTPATIENT_CLINIC_OR_DEPARTMENT_OTHER): Payer: Self-pay | Admitting: FAMILY MEDICINE

## 2023-03-14 DIAGNOSIS — J441 Chronic obstructive pulmonary disease with (acute) exacerbation: Secondary | ICD-10-CM

## 2023-03-14 MED ORDER — INCRUSE ELLIPTA 62.5 MCG/ACTUATION POWDER FOR INHALATION
1.0000 | DISK | Freq: Every day | RESPIRATORY_TRACT | 2 refills | Status: DC
Start: 2023-03-14 — End: 2023-05-20

## 2023-03-15 ENCOUNTER — Ambulatory Visit: Payer: Medicare (Managed Care) | Admitting: Specialist

## 2023-03-15 ENCOUNTER — Other Ambulatory Visit: Payer: Self-pay

## 2023-03-15 ENCOUNTER — Encounter (INDEPENDENT_AMBULATORY_CARE_PROVIDER_SITE_OTHER): Payer: Self-pay | Admitting: Specialist

## 2023-03-15 VITALS — BP 137/96 | HR 107 | Temp 98.0°F | Resp 20 | Ht 65.0 in | Wt 222.9 lb

## 2023-03-15 DIAGNOSIS — M5030 Other cervical disc degeneration, unspecified cervical region: Secondary | ICD-10-CM

## 2023-03-15 DIAGNOSIS — M545 Low back pain, unspecified: Secondary | ICD-10-CM | POA: Insufficient documentation

## 2023-03-15 DIAGNOSIS — G5641 Causalgia of right upper limb: Secondary | ICD-10-CM | POA: Insufficient documentation

## 2023-03-15 DIAGNOSIS — M25551 Pain in right hip: Secondary | ICD-10-CM | POA: Insufficient documentation

## 2023-03-15 DIAGNOSIS — M5090 Cervical disc disorder, unspecified, unspecified cervical region: Secondary | ICD-10-CM | POA: Insufficient documentation

## 2023-03-15 DIAGNOSIS — M791 Myalgia, unspecified site: Secondary | ICD-10-CM | POA: Insufficient documentation

## 2023-03-15 DIAGNOSIS — M79604 Pain in right leg: Secondary | ICD-10-CM | POA: Insufficient documentation

## 2023-03-15 DIAGNOSIS — M2550 Pain in unspecified joint: Secondary | ICD-10-CM | POA: Insufficient documentation

## 2023-03-15 DIAGNOSIS — M47812 Spondylosis without myelopathy or radiculopathy, cervical region: Secondary | ICD-10-CM | POA: Insufficient documentation

## 2023-03-15 NOTE — Nursing Note (Signed)
New Patient Visit    [name]    Chief Complaint   Patient presents with    Right Leg Pain    Low Back Pain    Neck Pain    Hip Pain         [place] Pain Rating Scale     On a scale of 0-10, during the past 24 hours, pain has interfered with you usual activity: 8     On a scale of 0-10, during the past 24 hours, pain has interfered with your sleep: 8    On a scale of 0-10, during the past 24 hours, pain has affected your mood: 9     On a scale of 0-10, during the past 24 hours, pain has contributed to your stress: 9     On a scale of 0-10, what is your overall pain Rating: 7        Vitals:    03/15/23 1342   BP: (!) 137/96   Pulse: (!) 107   Resp: 20   Temp: 36.7 C (98 F)   SpO2: 97%   Weight: 101 kg (222 lb 14.2 oz)   Height: 1.651 m (5\' 5" )   PainSc:   7   PainLoc: Back       Body mass index is 37.09 kg/m.    If taking opioid medication, current prescribing provider:     Recent imaging:  Recent Results (from the past [id number] hour(s))   MRI SPINE CERVICAL WO CONTRAST    Collection Time: 06/19/22 11:21 AM    Narrative    [name]  Female, 67 years old.    MRI SPINE CERVICAL WO CONTRAST performed on 06/19/2022 11:21 AM.    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 There is mild loss of disc height at all the levels along with mild generalized facet and uncovertebral arthropathy.    The C2-C3, C3-C4 and C4-C5 levels demonstrate patent spinal canal and neural foramina. Mild disc bulge is present at C4-C5.    The C5-C6 and C6-C7 levels demonstrate disc bulge resulting in mild spinal stenosis. There is significant right-sided foraminal encroachment at C5-C6 and mild bilateral foraminal encroachment at C6-C7.    The C7-T1 level demonstrates  patent spinal canal and neural foramina.    The spinal cord demonstrates preserved caliber and signal and prevertebral paraspinal soft tissues are unremarkable.      Impression    Mild cervical spondylosis relatively more focal at the mid cervical spine at the C5-C6 and C6-C7 levels. Findings are similar to the prior MRI examination.      Recent Results (from the past [id number] hour(s))   MRI SPINE LUMBOSACRAL WO CONTRAST    Collection Time: 09/27/22  1:53 PM    Narrative    CLINICAL INFORMATION:  [name]  Female, 67 years old.    MRI SPINE LUMBOSACRAL WO CONTRAST performed on 09/27/2022 1:53 PM.    REASON FOR EXAM:  G56.41: Complex regional pain syndrome type 2 of right upper extremity  M54.50: Lumbar pain      COMPARISON: MRI lumbar spine without contrast 02/23/2021.    TECHNIQUE: MRI of the lumbosacral spine was performed  without contrast.    FINDINGS:   Straightening of  the lumbar lordosis. Mild grade 1 retrolisthesis of L1 on L2. Vertebral body heights are normal. There is no pathologic marrow replacement. Mild disc height loss at L1-L2. Conus ends at L1-L2. No clumping or thickening of the cauda equina. No canal collections. Level by level evaluation is as follows:    T12-L1: No spinal canal or neural foraminal stenosis.  L1-L2: Diffuse disc bulge does not result in spinal canal stenosis. Neural foramina are normal in caliber. Mild facet hypertrophy.    L2-L3: No spinal canal or neural foraminal stenosis.. Mild facet hypertrophy.  L3-L4: Diffuse disc bulge and mild ligamentum flavum thickening do not result in spinal canal stenosis. Neural foramina are normal in caliber.    L4-L5: Diffuse disc bulge and mild ligamentum flavum thickening result in mild spinal canal stenosis. Posterior endplate osteophytes and advanced facet hypertrophy do not result in neural foraminal stenosis.  L5-S1: No spinal canal or neural foraminal stenosis.    Prevertebral soft tissues are normal.      Impression      Degenerative changes without high-grade spinal canal or neural foraminal stenosis.          No results found for this or any previous visit (from the past [id number] hour(s)).   No results found for this or any previous visit (from the past [id number] hour(s)).        Prior Pain Management:  PT: yes  Heat: yes  Ice: no   Chiropractor: no   Bedrest: yes  Injection: yes  VAS (0-10) Now: 7  VAS (0-10) Best: 7  VAS (0-10) Worst: 9    Appliances Needed:       Other Questions:  Recurring Fevers: no  Numbness/tingling: yes (hands tingle)  Trouble sleeping: yes  Poor/increased appetite: no  Weight loss/Gain: no  Muscle Spasm: yes  Cold/burning sensation: yes  Skin discolors/where: no (right shoulder)  Bowel/bladder issues: no  Urinary urgency-frequency: no    Casimer Bilis, Ambulatory Care Assistant  03/15/2023, 13:44

## 2023-03-15 NOTE — Progress Notes (Signed)
[facility]                              [facility]  [address]  [place] [address]  [phone]         History and Physical      Date of Service: 03/15/2023  Name: [name]  Date of Birth: 09/23/1956      Primary Service: NS  Consult Requested By: Particia Jasper, APRN,AGACNP-BC    Reason for Consult:     Consult - Request for Opinion       Obtained History From: Pt. and records provided and in EPIC    CHIEF COMPLAINT: neck and LBP    HPI: [name] is a 67 y.o. y.o., White female who presents with PTSD/GERD/HA/GAD/and the above, tried PT and traction for one session and couldn't tolerate it, denied frank B or B/ S or M, the patient is with moderate to severe pain and functional distress due to pain.    Words: Aching, Cramping, and Dull  When pain started:> 25 years  Intensity (current): 7  Intensity (past 24 hours averaged) 7  Location: Lower and LeftArm, Back, Leg, and Neck  Duration: Constant  Aggravated by: Movement /Alleviated by: Nothing  Associated Symptoms: Insomnia  Patient Pain Control Goal:  0  Level of Function: moderate  For the past three months, patient reports difficulties with these ADLS due to the pain reported above:  Working,shopping,dressing, showering, putting shoes on, bending forwards or backwards, twisting , repetitive motion, turning in bed.      Conservative Treatments tried and failed in the past  for the patient's moderate to severe pain.  PDMP reviewed   Please refer to the new patient form I have reviewed .    Procedures:        Nursing Notes:   Casimer Bilis, Ambulatory Care Assistant  03/15/23 1344  Signed  New Patient Visit    [name]    Chief Complaint   Patient presents with    Right Leg Pain    Low Back Pain    Neck Pain    Hip Pain         Briggs Pain Rating Scale     On a scale of 0-10, during the past 24 hours, pain has interfered with you usual activity: 8     On  a scale of 0-10, during the past 24 hours, pain has interfered with your sleep: 8    On a scale of 0-10, during the past 24 hours, pain has affected your mood: 9     On a scale of 0-10, during the past 24 hours, pain has contributed to your stress: 9     On a scale of 0-10, what is your overall pain Rating: 7        Vitals:    03/15/23 1342   BP: (!) 137/96   Pulse: (!) 107   Resp: 20   Temp: 36.7 C (98 F)   SpO2: 97%   Weight: 101 kg (222 lb 14.2 oz)   Height: 1.651 m (5\' 5" )   PainSc:   7   PainLoc: Back       Body mass index is 37.09 kg/m.    If taking opioid medication, current prescribing provider:     Recent imaging:  Recent Results (from the past [id number] hour(s))  MRI SPINE CERVICAL WO CONTRAST    Collection Time: 06/19/22 11:21 AM    Narrative    [name]  Female, 67 years old.    MRI SPINE CERVICAL WO CONTRAST performed on 06/19/2022 11:21 AM.    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 There is mild loss of disc height at all the levels along with mild generalized facet and uncovertebral arthropathy.    The C2-C3, C3-C4 and C4-C5 levels demonstrate patent spinal canal and neural foramina. Mild disc bulge is present at C4-C5.    The C5-C6 and C6-C7 levels demonstrate disc bulge resulting in mild spinal stenosis. There is significant right-sided foraminal encroachment at C5-C6 and mild bilateral foraminal encroachment at C6-C7.    The C7-T1 level demonstrates patent spinal canal and neural foramina.    The spinal cord demonstrates preserved caliber and signal and prevertebral paraspinal soft tissues are unremarkable.      Impression    Mild cervical spondylosis relatively more focal at the mid cervical spine at the C5-C6 and C6-C7  levels. Findings are similar to the prior MRI examination.      Recent Results (from the past [id number] hour(s))   MRI SPINE LUMBOSACRAL WO CONTRAST    Collection Time: 09/27/22  1:53 PM    Narrative    CLINICAL INFORMATION:  [name]  Female, 67 years old.    MRI SPINE LUMBOSACRAL WO CONTRAST performed on 09/27/2022 1:53 PM.    REASON FOR EXAM:  G56.41: Complex regional pain syndrome type 2 of right upper extremity  M54.50: Lumbar pain      COMPARISON: MRI lumbar spine without contrast 02/23/2021.    TECHNIQUE: MRI of the lumbosacral spine was performed  without contrast.    FINDINGS:   Straightening of the lumbar lordosis. Mild grade 1 retrolisthesis of L1 on L2. Vertebral body heights are normal. There is no pathologic marrow replacement. Mild disc height loss at L1-L2. Conus ends at L1-L2. No clumping or thickening of the cauda equina. No canal collections. Level by level evaluation is as follows:    T12-L1: No spinal canal or neural foraminal stenosis.  L1-L2: Diffuse disc bulge does not result in spinal canal stenosis. Neural foramina are normal in caliber. Mild facet hypertrophy.    L2-L3: No spinal canal or neural foraminal stenosis.. Mild facet hypertrophy.  L3-L4: Diffuse disc bulge and mild ligamentum flavum thickening do not result in spinal canal stenosis. Neural foramina are normal in caliber.    L4-L5: Diffuse disc bulge and mild ligamentum flavum thickening result in mild spinal canal stenosis. Posterior endplate osteophytes and advanced facet hypertrophy do not result in neural foraminal stenosis.  L5-S1: No spinal canal or neural foraminal stenosis.    Prevertebral soft tissues are normal.      Impression     Degenerative changes without high-grade spinal canal or neural foraminal stenosis.          No results found for this or any previous visit (from the past [id number] hour(s)).   No results found for this or any previous visit (from the past [id number] hour(s)).        Prior Pain  Management:  PT: yes  Heat: yes  Ice: no   Chiropractor: no   Bedrest:  yes  Injection: yes  VAS (0-10) Now: 7  VAS (0-10) Best: 7  VAS (0-10) Worst: 9    Appliances Needed:       Other Questions:  Recurring Fevers: no  Numbness/tingling: yes (hands tingle)  Trouble sleeping: yes  Poor/increased appetite: no  Weight loss/Gain: no  Muscle Spasm: yes  Cold/burning sensation: yes  Skin discolors/where: no (right shoulder)  Bowel/bladder issues: no  Urinary urgency-frequency: no    Casimer Bilis, Ambulatory Care Assistant  03/15/2023, 13:44       HOME MEDICATION REGIMEN: albuterol sulfate (PROAIR HFA) 90 mcg/actuation Inhalation oral inhaler, Take 2 Puffs by inhalation Every 4 hours as needed  baclofen (LIORESAL) 5 mg Oral Tablet, Take 1 Tablet (5 mg total) by mouth Three times a day as needed  clobetasoL (TEMOVATE) 0.05 % Ointment, Apply topically Twice daily  desvenlafaxine succinate (PRISTIQ) 50 mg Oral Tablet Sustained Release 24 hr, Take 1 Tablet (50 mg total) by mouth Once a day  diclofenac sodium (VOLTAREN) 1 % Gel, Apply topically Four times a day - before meals and bedtime  fluticasone propionate (FLONASE) 50 mcg/actuation Nasal Spray, Suspension, ADMINISTER 2 SPRAYS INTO EACH NOSTRIL ONCE A DAY. (Patient taking differently: Administer 2 Sprays into each nostril Once a day Takes in am)  gabapentin (NEURONTIN) 800 mg Oral Tablet, Take 1 Tablet (800 mg total) by mouth Twice daily  HYDROcodone-acetaminophen (NORCO) 5-325 mg Oral Tablet, Take 1 Tablet by mouth Every 6 hours as needed for Pain (Patient not taking: Reported on 03/15/2023)  metroNIDAZOLE (FLAGYL) 500 mg Oral Tablet, Take 1 Tablet (500 mg total) by mouth Three times a day  nitroGLYCERIN (NITROSTAT) 0.4 mg Sublingual Tablet, Sublingual, Place 1 Tablet (0.4 mg total) under the tongue Every 5 minutes as needed for Chest pain for 3 doses over 15 minutes  ondansetron (ZOFRAN) 4 mg Oral Tablet, Take 1 Tablet (4 mg total) by mouth Every 8 hours as needed for  Nausea/Vomiting  pantoprazole (PROTONIX) 40 mg Oral Tablet, Delayed Release (E.C.), Take 1 Tablet (40 mg total) by mouth Once a day Takes in am  tiotropium bromide (SPIRIVA HANDIHALER) 18 mcg Inhalation Capsule, w/Inhalation Device, Take 1 Capsule (18 mcg total) by inhalation Once a day  trimethoprim-sulfamethoxazole (BACTRIM DS) 160-800mg  per tablet, Take 1 Tablet (160 mg total) by mouth Every 12 hours  umeclidinium (INCRUSE ELLIPTA) 62.5 mcg/actuation Inhalation oral inhaler, Take 1 Inhalation by inhalation Once a day    No facility-administered medications prior to visit.      CURRENT REGIMEN:   Outpatient Medications Marked as Taking for the 03/15/23 encounter (Office Visit) with Monia Sabal, MD   Medication Sig    albuterol sulfate (PROAIR HFA) 90 mcg/actuation Inhalation oral inhaler Take 2 Puffs by inhalation Every 4 hours as needed    baclofen (LIORESAL) 5 mg Oral Tablet Take 1 Tablet (5 mg total) by mouth Three times a day as needed    clobetasoL (TEMOVATE) 0.05 % Ointment Apply topically Twice daily    desvenlafaxine succinate (PRISTIQ) 50 mg Oral Tablet Sustained Release 24 hr Take 1 Tablet (50 mg total) by mouth Once a day    diclofenac sodium (VOLTAREN) 1 % Gel Apply topically Four times a day - before meals and bedtime    fluticasone propionate (FLONASE) 50 mcg/actuation Nasal Spray, Suspension ADMINISTER 2 SPRAYS INTO EACH NOSTRIL ONCE A DAY. (Patient taking differently: Administer 2 Sprays into each nostril Once a day Takes in am)    gabapentin (NEURONTIN) 800 mg Oral Tablet Take  1 Tablet (800 mg total) by mouth Twice daily    metroNIDAZOLE (FLAGYL) 500 mg Oral Tablet Take 1 Tablet (500 mg total) by mouth Three times a day    nitroGLYCERIN (NITROSTAT) 0.4 mg Sublingual Tablet, Sublingual Place 1 Tablet (0.4 mg total) under the tongue Every 5 minutes as needed for Chest pain for 3 doses over 15 minutes    pantoprazole (PROTONIX) 40 mg Oral Tablet, Delayed Release (E.C.) Take 1 Tablet (40 mg total)  by mouth Once a day Takes in am    tiotropium bromide (SPIRIVA HANDIHALER) 18 mcg Inhalation Capsule, w/Inhalation Device Take 1 Capsule (18 mcg total) by inhalation Once a day    trimethoprim-sulfamethoxazole (BACTRIM DS) 160-800mg  per tablet Take 1 Tablet (160 mg total) by mouth Every 12 hours    umeclidinium (INCRUSE ELLIPTA) 62.5 mcg/actuation Inhalation oral inhaler Take 1 Inhalation by inhalation Once a day       ROS: (MUST comment on all "Abnormal" findings)  Reviewed/ Summarized records and/or obtained History from patient and history reviewed via medical record  Other than ROS in the HPI, all other systems were negative.    PAST MEDICAL/ FAMILY/ SOCIAL HISTORY:   Reviewed/ Summarized records and/or obtained History from patient and history reviewed via medical record  Past Medical History:   Diagnosis Date    Anxiety     Arthritis     Awareness under anesthesia     woke during colonoscopy    Cancer (CMS HCC)     internal skin cancer    Chest pain 02/19/2016    Chronic pain     lower back    Depression     Dyspnea on exertion     GERD (gastroesophageal reflux disease)     controlled w/med    Headache     Hearing loss     Heart murmur 1979    benign    Hyperlipidemia LDL goal < 100 08/29/2013    denies    Migraine     MINOR CAD (coronary artery disease) 11/10/2012    no treatment other than cholesterol medications; follows regularly with K.Dalton, PA    Multinodular goiter     Muscle weakness     neck     Neck problem     spinal stenosis    Obesity     Peripheral edema     Rash     face only eczema    Rheumatic fever     in 1974 no complications    S/P thyroidectomy     Sleep apnea     Squamous cell carcinoma 02/06/2015    right side of neck    Thyroid disorder     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           No current facility-administered medications for this visit.    Allergies   Allergen Reactions    Cymbalta [Duloxetine]      Rash on feet and hands    Blue Dye      Told to avoid due to patch test result by Abundio Miu    Flavoring Agent  Other Adverse Reaction (Add comment)     Patient unsure of reaction  Lipitor [Atorvastatin] Myalgia     Muscle pain    Nickel      Blisters  "Allergic to all kinds of metal"     Codeine Itching     RASH    Mobic [Meloxicam] Nausea/ Vomiting     Nervous and shakes     Naprosyn [Naproxen] Nausea/ Vomiting    Nsaids (Non-Steroidal Anti-Inflammatory Drug) Nausea/ Vomiting     Past Surgical History:   Procedure Laterality Date    CATARACT EXTRACTION Right 12/07/2022    CHALAZION EXCISION Left 12/22/2021    and conj lesion excision from caruncle    COLONOSCOPY  01/06/2009    COLONOSCOPY performed by Jan Fireman, AHMED F at [facility] OR ENDO    GASTROSCOPY  03/05/2010    GASTROSCOPY performed by Boyd Kerbs, SWATI at [facility] OR ENDO    GASTROSCOPY WITH BIOPSY  03/05/2010    GASTROSCOPY WITH BIOPSY performed by Knox Saliva at [facility] OR ENDO    HX ADENOIDECTOMY      HX ANKLE FRACTURE TX  2007    left distal fibula, casted    HX APPENDECTOMY      HX CHOLECYSTECTOMY      HX COLONOSCOPY      HX CYSTOCELE REPAIR  09/24/2009    HX HAND SURGERY  2012    for Carpal tunnel : right side treated    HX HEART CATHETERIZATION      HX HYSTERECTOMY      HX OOPHORECTOMY      left ovary removed    HX PAROTIDECTOMY       "surgical clamp"     HX PARTIAL THYROIDECTOMY Right 04/05/2019    R hemithyroidectomy for nodule, final path follicular adenoma    HX TONSILLECTOMY      HX TOTAL VAGINAL HYSTERECTOMY  1979    HX WISDOM TEETH EXTRACTION      HX WRIST FRACTURE TX Left 2007    FOOSH injury    PARATHYROID GLAND SURGERY      PARS PLANA VITRECTOMY Right 11/02/2021    Pars plana vitrectomy, nternal limiting membrane (ILM) and epiretinal membrane (ERM) peel, Injection of 20%  sf6 gas, Subtenon's injection of Cefuroxime 50 mg and  Dexamethasone 2 mg    PB REVISE ULNAR NERVE AT ELBOW Left 1979    PB UPPER GI ENDOSCOPY,BIOPSY  12/19/2007    patulous GE junction zone, erythema, nonerosive GERD    SEPTOPLASTY           Social History     Tobacco Use    Smoking status: Former     Current packs/day: 0.00     Average packs/day: 2.0 packs/day for 40.0 years (80.0 ttl pk-yrs)     Types: Cigarettes     Start date: 01/22/1965     Quit date: 01/22/2005     Years since quitting: 18.1    Smokeless tobacco: Never   Vaping Use    Vaping status: Never Used   Substance Use Topics    Alcohol use: No     Alcohol/week: 0.0 standard drinks of alcohol    Drug use: No     Family Medical History:       Problem Relation (Age of Onset)    Bipolar Disorder Daughter, Paternal Uncle    Breast Cancer Paternal Aunt, Paternal Aunt, Paternal Aunt, Paternal Aunt, Paternal Aunt, Other    Cancer Paternal Aunt, Paternal Aunt, Paternal Aunt, Paternal Aunt, Paternal Aunt, Other (56)    Congestive Heart Failure Father (50)  Coronary Artery Disease Father, Mother (31)    Diabetes Mother    Heart Attack Father, Sister    High Cholesterol Mother    Hypertension (High Blood Pressure) Mother, Brother, Sister, Sister    Kidney Disease Sister    Leukemia Paternal Uncle    Stroke Paternal Grandmother    Thyroid Disease Sister                    I have reviewed and updated as appropriate the past medical, family and social history.                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  HENT:Head atraumatic and normocephalic  Neck: no  thyromegaly or lymphadenopathy  Musculoskeletal:   Gait and Station:Normal  Muscle Strength (upper extremeties): Normal  Muscle Strength (lower extremeties) : Normal  Muscle Tone (upper extremeties): Normal  Muscle Tone (lower extremeties): Normal  Sensation: Normal  Straight Leg Raise: Left: negative Right:negative  Crossed Straight Leg Raise: Left: negative Right: negative  Patrick's sign: Left: negative Right: negative  Facet loading: negative  Musculoskeletal Tenderness: positive  Lungs: normal percussion bilaterally  Cardiovascular: regular rate and rhythm on the monitor  Abdomen: non-distended  Extremities: No cyanosis or deformity  Skin: Skin warm and dry  Neurologic: Alert and oriented x3  Lymphatics: No lymphadenopathy  Psychiatric: Normal affect, behavior, memory, thought content, judgement, and speech.    Labs Ordered/ Reviewed : (and discussed with patient)  Reviewed: Labs:  I have reviewed all lab results.      Radiology Tests Ordered/ Reviewed: (and discussed with patient)  Reviewed: MRI: Direct visualization of the image on 03/15/2023 on [facility] PACS showed:  Degenerative changes without high-grade spinal canal or neural foraminal stenosis.       Mild cervical spondylosis relatively more focal at the mid cervical spine at the C5-C6 and C6-C7 levels.       ASSESSMENT:      ICD-10-CM    1. Myalgia  M79.10 20553 - INJ SINGLE OR MULTIPLE TRIGGER POINTS, 3 OR MORE MUSCLE -No Imaging; 56213 Prior Auth/Scheduling Procedure     20553 - INJ SINGLE OR MULTIPLE TRIGGER POINTS, 3 OR MORE MUSCLE (AMB ONLY)     Refer to [facility]      2. Arthralgia, unspecified joint  M25.50       3. Complex regional pain syndrome type 2 of right upper extremity  G56.41       4. Cervical neck pain with evidence of disc disease  M50.90 20553 - INJ SINGLE OR MULTIPLE TRIGGER POINTS, 3 OR MORE MUSCLE -No Imaging; 08657 Prior Auth/Scheduling Procedure     20553 - INJ SINGLE OR MULTIPLE TRIGGER POINTS, 3 OR MORE MUSCLE (AMB  ONLY)     Refer to [facility]      5. Lumbar pain with radiation down right leg  M54.50 Refer to [facility]    M79.604       6. Right hip pain  M25.551       7. Spondylosis of cervical region without myelopathy or radiculopathy  M47.812 20553 - INJ SINGLE OR MULTIPLE TRIGGER POINTS, 3 OR MORE MUSCLE -No Imaging; 16109 Prior Auth/Scheduling Procedure     20553 - INJ SINGLE OR MULTIPLE TRIGGER POINTS, 3 OR MORE MUSCLE (AMB ONLY)     Refer to [facility]          Patient Active Problem List   Diagnosis    Migraine Headaches    GERD not well controlled    Patellofemoral pain syndrome    Multinodular goiter    Microscopic hematuria    Neck pain    Lichen sclerosus et atrophicus of the vulva    Carpal tunnel syndrome on right    Osteoarthritis, knee    Left knee pain    Chest pain at rest    MINOR CAD (coronary artery disease)    Hyperlipidemia LDL goal < 100    Otitis media of both ears    Mass of parotid gland, right    Squamous cell carcinoma    Chest pain    Dysuria    Multiple pulmonary nodules determined by computed tomography of lung    History of rheumatic fever    Neuropathy (CMS HCC)    PTSD (post-traumatic stress disorder)    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  The patient had a opportunity to discuss tests / findings and their plan of care ; and all questions were answered. Pt educated on red flags  and when to go to the ED ( red flags ) .    F/u as needed, Return to clinic or to closest emergency room with any new or  worsening in conditions    The patient education was given that discussed improving their general level of fitness, weight control and core strengthening to help their chronic pain.    The procedure using a spine model was explained, including the associated risks such as but not limited to pain at the injection site, infection, bleeding, reaction to medication and failure of the pain to improve. Our impression, recommendation, and plan from today's visit was reviewed, explained and discussed in detail with the patient in the office. All of the patients questions were answered. Patient verbalized and in agreement with our plan.      I spent 40 minutes more than half the time in explanations, organizing care and planning.    A copy of the consult and recommendation will be sent to PCP and/or referring physician if not on EPIC.    The date and time stamp below reflect the action of opening and starting the note and do not reflect the visit start time, end time or visit length.    Monia Sabal, MD 03/15/2023, 16:07

## 2023-03-16 ENCOUNTER — Ambulatory Visit (INDEPENDENT_AMBULATORY_CARE_PROVIDER_SITE_OTHER): Payer: Self-pay | Admitting: Specialist

## 2023-03-17 ENCOUNTER — Telehealth (INDEPENDENT_AMBULATORY_CARE_PROVIDER_SITE_OTHER): Payer: Self-pay | Admitting: Specialist

## 2023-03-17 ENCOUNTER — Other Ambulatory Visit: Payer: Self-pay

## 2023-03-17 ENCOUNTER — Ambulatory Visit (INDEPENDENT_AMBULATORY_CARE_PROVIDER_SITE_OTHER): Payer: Medicare (Managed Care) | Admitting: Student in an Organized Health Care Education/Training Program

## 2023-03-17 VITALS — BP 136/90 | HR 97 | Ht 65.0 in | Wt 221.3 lb

## 2023-03-17 DIAGNOSIS — F411 Generalized anxiety disorder: Secondary | ICD-10-CM

## 2023-03-17 DIAGNOSIS — F332 Major depressive disorder, recurrent severe without psychotic features: Secondary | ICD-10-CM

## 2023-03-17 DIAGNOSIS — Z733 Stress, not elsewhere classified: Secondary | ICD-10-CM

## 2023-03-17 DIAGNOSIS — F4310 Post-traumatic stress disorder, unspecified: Secondary | ICD-10-CM

## 2023-03-17 DIAGNOSIS — Z634 Disappearance and death of family member: Secondary | ICD-10-CM

## 2023-03-17 DIAGNOSIS — Z9151 Personal history of suicidal behavior: Secondary | ICD-10-CM

## 2023-03-17 DIAGNOSIS — Z8659 Personal history of other mental and behavioral disorders: Secondary | ICD-10-CM

## 2023-03-17 MED ORDER — DESVENLAFAXINE SUCCINATE ER 100 MG TABLET,EXTENDED RELEASE 24 HR
100.0000 mg | ORAL_TABLET | Freq: Every day | ORAL | 2 refills | Status: DC
Start: 2023-03-17 — End: 2023-06-30

## 2023-03-17 NOTE — Telephone Encounter (Signed)
CM contacted PT and went over Non Fasting guidelines for upcoming procedure on 05.24.24 with Dr Rizk. PT states they are not currently taking antibiotics. PT had no further questions at this time Clinic phone number was provided to PT at this time.    03/17/2023  Tamsin Nader, CASE MANAGER
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
## 2023-03-18 ENCOUNTER — Encounter (INDEPENDENT_AMBULATORY_CARE_PROVIDER_SITE_OTHER): Payer: Self-pay | Admitting: Specialist

## 2023-03-18 ENCOUNTER — Ambulatory Visit: Payer: Medicare (Managed Care) | Attending: Specialist | Admitting: Specialist

## 2023-03-18 VITALS — Temp 97.9°F | Wt 220.9 lb

## 2023-03-18 DIAGNOSIS — M791 Myalgia, unspecified site: Secondary | ICD-10-CM

## 2023-03-18 DIAGNOSIS — M5090 Cervical disc disorder, unspecified, unspecified cervical region: Secondary | ICD-10-CM

## 2023-03-18 DIAGNOSIS — M47812 Spondylosis without myelopathy or radiculopathy, cervical region: Secondary | ICD-10-CM

## 2023-03-18 DIAGNOSIS — M542 Cervicalgia: Secondary | ICD-10-CM

## 2023-03-18 NOTE — Procedures (Signed)
[facility]  [address]  [facility] [place] [address]  Operated by [facility]  Procedure Note    Name: [name] MRN:  [id number]   Date: 03/18/2023 DOB:  Feb 28, 1956 (66 y.o.)         20553 - INJ SINGLE OR MULTIPLE TRIGGER POINTS, 3 OR MORE MUSCLE (AMB ONLY)    Performed by: Monia Sabal, MD  Authorized by: Monia Sabal, MD    Time Out:     Immediately before the procedure, a time out was called:  Yes    Patient verified:  Yes    Procedure Verified:  Yes    Site Verified:  Yes  Documentation:      PATIENT NAME: [name]   CHART NUMBER: [id number]     DATE OF BIRTH: [birth year]   DATE OF SERVICE: 03/18/2023         SITE OF SERVICE: [facility]  [facility]  [place] interventional pain team     PREOPERATIVE DIAGNOSIS:  Myalgia    POSTOPERATIVE DIAGNOSIS: Same    NAME OF PROCEDURE: Trigger Point Injection 4 muscle groups B C paravertebral and B Trapezii/SS    SURGEON: Nobie Putnam, MD    ANESTHESIA: None      ESTIMATED BLOOD LOSS: None      COMPLICATIONS:  were no complications        DESCRIPTION OF PROCEDURE:       After informed consent was obtained, risks and benefits explained, questions answered, patient was placed in  position. Areas of most tenderness were palpated and marked.  The skin was prepped and draped in the usual sterile fashion using betadine x3 and chloroprep x2.  At each of the previously marked locations, 1- 1.51ml of 0.25 % Bupivacaine MPF  was injected in each trigger point after negative aspiration in a fanning manner in various planes using a g25 1.5 inch sterile needle.  Total volume injected was 12 cc of Bupivcaine.There were no complications or paresthesias.   Patient tolerated the procedure well and all needles were removed intact.       Patient was observed in recovery room in stable condition post- procedure. Vitals were obtained Q10,mins x2 and first set were stable was discharged home with family, f/u prn.    Monia Sabal, MD  03/18/2023,  11:27          Monia Sabal, MD

## 2023-03-18 NOTE — Nursing Note (Signed)
Pain and Function:     [place] Pain Rating Scale     On a scale of 0-10, during the past 24 hours, pain has interfered with you usual activity:   7    On a scale of 0-10, during the past 24 hours, pain has interfered with your sleep:  8    On a scale of 0-10, during the past 24 hours, pain has affected your mood:   7    On a scale of 0-10, during the past 24 hours, pain has contributed to your stress:  8     On a scale of 0-10, what is your overall pain Rating:  7

## 2023-03-18 NOTE — Patient Instructions (Signed)
[facility]  [address]  [place] [address]  Dept: [phone]  Dept Fax: [phone]  [phone]                                                 SPECIAL PROCEDURES                                     DISCHARGE FORM                                          [phone]      Please follow the instructions listed below for your procedures.  If you have questions concerning your procedure, you may call and leave a message.  Messages will be returned by the end of the next business day.  If you have an emergency, proceed to your local Emergency Department.      PROCEDURE: TRIGGER POINT INJECTION    Do not drive a car or operate machinery until tomorrow.  Rest today and return to normal activities tomorrow.  If you are on restricted activities by your physician, please continue to follow these.  If you are not sure, contact your physician.  If you have soreness at the injection site, the application of heat or ice may be helpful. Mild analgesics may also be used.  Steroid injections may cause temporary increase of blood sugar levels.    These instructions have been reviewed with the patient and appropriate questions have answers.  Jerlyn Ly, LPN 2/53/6644 03:47

## 2023-03-28 ENCOUNTER — Encounter (INDEPENDENT_AMBULATORY_CARE_PROVIDER_SITE_OTHER): Payer: Self-pay | Admitting: Chiropractor

## 2023-03-28 ENCOUNTER — Ambulatory Visit (INDEPENDENT_AMBULATORY_CARE_PROVIDER_SITE_OTHER): Payer: Medicare (Managed Care) | Admitting: Chiropractor

## 2023-03-28 ENCOUNTER — Other Ambulatory Visit: Payer: Self-pay

## 2023-03-28 VITALS — BP 133/86 | HR 86 | Temp 97.3°F | Resp 20 | Ht 64.0 in | Wt 220.0 lb

## 2023-03-28 DIAGNOSIS — M47812 Spondylosis without myelopathy or radiculopathy, cervical region: Secondary | ICD-10-CM

## 2023-03-28 DIAGNOSIS — M9902 Segmental and somatic dysfunction of thoracic region: Secondary | ICD-10-CM

## 2023-03-28 DIAGNOSIS — M791 Myalgia, unspecified site: Secondary | ICD-10-CM

## 2023-03-28 DIAGNOSIS — M9904 Segmental and somatic dysfunction of sacral region: Secondary | ICD-10-CM

## 2023-03-28 DIAGNOSIS — M9901 Segmental and somatic dysfunction of cervical region: Secondary | ICD-10-CM

## 2023-03-28 DIAGNOSIS — M5090 Cervical disc disorder, unspecified, unspecified cervical region: Secondary | ICD-10-CM

## 2023-03-28 DIAGNOSIS — M79604 Pain in right leg: Secondary | ICD-10-CM

## 2023-03-28 DIAGNOSIS — M9903 Segmental and somatic dysfunction of lumbar region: Secondary | ICD-10-CM

## 2023-03-28 DIAGNOSIS — M545 Low back pain, unspecified: Secondary | ICD-10-CM

## 2023-03-28 NOTE — Progress Notes (Signed)
[facility]  [address] [place], [address]  [place], [place] [address]  [phone]          Name: [name]  MRN: [id number]  Date:03/28/2023  Birthday: 1956/04/04          CHIEF COMPLAINT  Chief Complaint   Patient presents with    Neck Pain    Back Pain    Myalgia         HPI  This patient is a 67 y.o. year old female who presents for evaluation and management of  constant chronic neck pain which intermittently radiates down her arm and into her hands. She states that her neck has been bothering her for approximately 5 years. 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 She states that her LBP intermittently radiates down her legs and into her feet. She states that she recently had [facility] for her neck pain. She states that the TPI's have helped a little. She states that she is under the care of a Neurosurgeon and the neurosurgeon did nerve blocks in the past. She states that the first nerve blocked helped for a few months and the second nerve block helped for 2 weeks. She denies having any fractures, spinal surgeries, or bowel/bladder incontinence.     Duration of symptoms:  Neck pain for approximately 5 years. LBP for more than 25 years.    Onset: insidious  Quality: sharp, dull, burning, deep, shooting, numbness, tingling  Frequency: continuous  Timing: the symptom is not worse at any particular time of day  Provocative: moving the neck, driving, standing, walking, lifting, sitting, getting up from a seated position, changing positions, exercising, laying on side in bed  Palliative: resting, heat, pain medication  Radiation/Referral: yes, intermittently down her arms and into her hands. Intermittently down her legs and into her feet.  Pain with coughing/sneezing/using bathroom: YES , it sometimes increases her pain.  Bowel/bladder dysfunction: no  Fever/chills: no  Extremity numbness/tingling: YES , intermittently down her arms and  legs.  Extremity weakness: no   Affected activities of daily living:  Can perform, but it sometimes increases her pain.  Past treatments and results:  Past treatments: medications, injections, physical therapy      Good results with medications, injections               Poor or minimal results with physical therapy  Imaging for this complaint: x-ray, MRI, performed at [facility]  Recent accidents or surgeries: no        MEDICAL HISTORY  Current Outpatient Medications   Medication Sig    albuterol sulfate (PROAIR HFA) 90 mcg/actuation Inhalation oral inhaler Take 2 Puffs by inhalation Every 4 hours as needed    baclofen (LIORESAL) 5 mg Oral Tablet Take 1 Tablet (5 mg total) by mouth Three times a day as needed    clobetasoL (TEMOVATE) 0.05 % Ointment Apply topically Twice daily    desvenlafaxine (PRISTIQ) 100 mg Oral Tablet Sustained Release 24 hr Take 1 Tablet (100 mg total) by mouth Once a day    diclofenac sodium (VOLTAREN) 1 % Gel Apply topically Four times a day - before meals and bedtime    fluticasone propionate (FLONASE) 50 mcg/actuation Nasal Spray, Suspension ADMINISTER 2 SPRAYS INTO EACH NOSTRIL ONCE A DAY. (Patient taking differently: Administer 2 Sprays into each nostril Once a day Takes in am)    gabapentin (NEURONTIN) 800 mg Oral Tablet  Take 1 Tablet (800 mg total) by mouth Twice daily    HYDROcodone-acetaminophen (NORCO) 5-325 mg Oral Tablet Take 1 Tablet by mouth Every 6 hours as needed for Pain (Patient not taking: Reported on 03/15/2023)    metroNIDAZOLE (FLAGYL) 500 mg Oral Tablet Take 1 Tablet (500 mg total) by mouth Three times a day    nitroGLYCERIN (NITROSTAT) 0.4 mg Sublingual Tablet, Sublingual Place 1 Tablet (0.4 mg total) under the tongue Every 5 minutes as needed for Chest pain for 3 doses over 15 minutes    ondansetron (ZOFRAN) 4 mg Oral Tablet Take 1 Tablet (4 mg total) by mouth Every 8 hours as needed for Nausea/Vomiting    pantoprazole (PROTONIX) 40 mg Oral Tablet, Delayed Release (E.C.) Take  1 Tablet (40 mg total) by mouth Once a day Takes in am    tiotropium bromide (SPIRIVA HANDIHALER) 18 mcg Inhalation Capsule, w/Inhalation Device Take 1 Capsule (18 mcg total) by inhalation Once a day    trimethoprim-sulfamethoxazole (BACTRIM DS) 160-800mg  per tablet Take 1 Tablet (160 mg total) by mouth Every 12 hours (Patient not taking: Reported on 03/18/2023)    umeclidinium (INCRUSE ELLIPTA) 62.5 mcg/actuation Inhalation oral inhaler Take 1 Inhalation by inhalation Once a day     Allergies   Allergen Reactions    Cymbalta [Duloxetine]      Rash on feet and hands    Blue Dye      Told to avoid due to patch test result by [employer]  Other Adverse Reaction (Add comment)     Patient unsure of reaction    Lipitor [Atorvastatin] Myalgia     Muscle pain    Nickel      Blisters  "Allergic to all kinds of metal"     Codeine Itching     RASH    Mobic [Meloxicam] Nausea/ Vomiting     Nervous and shakes     Naprosyn [Naproxen] Nausea/ Vomiting    Nsaids (Non-Steroidal Anti-Inflammatory Drug) Nausea/ Vomiting     Past Medical History:   Diagnosis Date    Anxiety     Arthritis     Awareness under anesthesia     woke during colonoscopy    Cancer (CMS HCC)     internal skin cancer    Chest pain 02/19/2016    Chronic pain     lower back    Depression     Dyspnea on exertion     GERD (gastroesophageal reflux disease)     controlled w/med    Headache     Hearing loss     Heart murmur 1979    benign    Hyperlipidemia LDL goal < 100 08/29/2013    denies    Migraine     MINOR CAD (coronary artery disease) 11/10/2012    no treatment other than cholesterol medications; follows regularly with K.Dalton, PA    Multinodular goiter     Muscle weakness     neck     Neck problem     spinal stenosis    Obesity     Peripheral edema     Rash     face only eczema    Rheumatic fever     in 1974 no complications    S/P thyroidectomy     Sleep apnea     Squamous cell carcinoma 02/06/2015    right side of neck    Thyroid disorder      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    Tinnitus     Vaginal prolapse 2012    surgery improved significantly    Wears glasses          Past Surgical History:   Procedure Laterality Date    CATARACT EXTRACTION Right 12/07/2022    CHALAZION EXCISION Left 12/22/2021    and conj lesion excision from caruncle    COLONOSCOPY  01/06/2009    COLONOSCOPY performed by Jan Fireman, AHMED F at [facility] OR ENDO    GASTROSCOPY  03/05/2010    GASTROSCOPY performed by Knox Saliva at [facility] OR ENDO    GASTROSCOPY WITH BIOPSY  03/05/2010    GASTROSCOPY WITH BIOPSY performed by Knox Saliva at [facility] OR ENDO    HX ADENOIDECTOMY      HX ANKLE FRACTURE TX  2007    left distal fibula, casted    HX APPENDECTOMY      HX CHOLECYSTECTOMY      HX COLONOSCOPY      HX CYSTOCELE REPAIR  09/24/2009    HX HAND SURGERY  2012    for Carpal tunnel : right side treated    HX HEART CATHETERIZATION      HX HYSTERECTOMY      HX OOPHORECTOMY      left ovary removed    HX PAROTIDECTOMY       "surgical clamp"     HX PARTIAL THYROIDECTOMY Right 04/05/2019    R hemithyroidectomy for nodule, final path follicular adenoma    HX TONSILLECTOMY      HX TOTAL VAGINAL HYSTERECTOMY  1979    HX WISDOM TEETH EXTRACTION      HX WRIST FRACTURE TX Left 2007    FOOSH injury    PARATHYROID GLAND SURGERY      PARS PLANA VITRECTOMY Right 11/02/2021    Pars plana vitrectomy, nternal limiting membrane (ILM) and epiretinal membrane (ERM) peel, Injection of 20%  sf6 gas, Subtenon's injection of Cefuroxime 50 mg and Dexamethasone 2 mg    PB REVISE ULNAR NERVE AT ELBOW Left 1979    PB UPPER GI ENDOSCOPY,BIOPSY  12/19/2007    patulous GE junction zone, erythema, nonerosive GERD    SEPTOPLASTY           Family Medical History:       Problem Relation (Age of Onset)    Bipolar Disorder Daughter, Paternal Uncle    Breast Cancer Paternal Aunt, Paternal Aunt, Paternal Aunt, Paternal Aunt, Paternal Aunt, Other    Cancer Paternal Aunt, Paternal Aunt, Paternal Aunt, Paternal Aunt, Paternal Aunt,  Other (40)    Congestive Heart Failure Father (50)    Coronary Artery Disease Father, Mother (54)    Diabetes Mother    Heart Attack Father, Sister    High Cholesterol Mother    Hypertension (High Blood Pressure) Mother, Brother, Sister, Sister    Kidney Disease Sister    Leukemia Paternal Uncle    Stroke Paternal Grandmother    Thyroid Disease Sister            Social History     Socioeconomic History    Marital status: Divorced    Number of children: 3   Occupational History    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     Employer: NOT EMPLOYED     Comment: occasionally   Tobacco Use    Smoking status: Former     Current packs/day: 0.00     Average packs/day: 2.0 packs/day for 40.0 years (80.0 ttl pk-yrs)     Types:  Cigarettes     Start date: 01/22/1965     Quit date: 01/22/2005     Years since quitting: 18.1    Smokeless tobacco: Never   Vaping Use    Vaping status: Never Used   Substance and Sexual Activity    Alcohol use: No     Alcohol/week: 0.0 standard drinks of alcohol    Drug use: No    Sexual activity: Yes     Partners: Male   Other Topics Concern    Uses Cane No    Uses walker No    Uses wheelchair No    Right hand dominant Yes    Left hand dominant No    Ambidextrous No    Ability to Walk 1 Flight of Steps without SOB/CP Yes    Routine Exercise No    Ability to Walk 2 Flight of Steps without SOB/CP Yes    Ability To Do Own ADL's Yes    Uses Walker No    Other Activity Level Yes     Comment: housework     Uses Cane No    Shift Work No    Unusual Sleep-Wake Schedule No   Social History Narrative    Right handed.          REVIEW OF SYSTEMS  Constitutional: Negative for fever   HENT: Negative  Eyes: Negative  Respiratory: Negative  Cardiovascular: Negative for chest pain  Gastrointestinal: Negative for abdominal pain and bowel incontinence  Endocrine: Negative  Genitourinary: Negative for bladder incontinence, dysuria, and pelvic pain.  Musculoskeletal: Positive for back pain and neck pain  Skin:  Negative  Neurological: Negative  Psychiatric/behavioral: Negative        EXAMINATION  BP 133/86   Pulse 86   Temp 36.3 C (97.3 F) (Thermal Scan)   Resp 20   Ht 1.626 m (5\' 4" )   Wt 99.8 kg (220 lb)   SpO2 97%   BMI 37.76 kg/m         Mental status: Alert and oriented x3  Inspection:  No noted deformities or signs of inflammation.   Respiration:  within normal limits  Neurologic:    Cranial nerves 2-12 are grossly intact.      Upper extremity DTR's: biceps, brachioradialis, and triceps all 2+ bilaterally    UE muscle testing:  deltoid, biceps, wrist extension, wrist flexion, finger extension, finger abduction, and finger adduction all 5/5 bilaterally  UE sensory:                            Grossly intact.  Lower extremity DTR's: patellar and achilles 2+ bilaterally    LE muscle testing:  iliopsoas, quadraceps, tibialis anterior, external hallucis longus, and peroneus longus and brevis are all 5/5 bilaterally, heel/toe walk within normal limits  LE sensory:                             Grossly intact.      Orthopedic:  Cervical ROM: Decreased.    Cervical compression:  negative  Cervical distraction: negative  Shoulder depression: negative  Lumbopelvic ROM:  Decreased.  Seated Lasegue's: Right: negative. Left: negative.        Palpation:    Joint mobility restrictions: C7-T1, T4-T5, T5-T6, L5-S1, left upper sacroliliac  Tenderness: Full Spine.  Trigger points: Cervical, thoracic and lumbar paraspinals.  Hypertonicity: Cervical, thoracic and lumbar paraspinals.    Outcomes  Assessments:    [place] Pain Rating Scale     On a scale of 0-10, during the past 24 hours, pain has interfered with you usual activity: 8     On a scale of 0-10, during the past 24 hours, pain has interfered with your sleep: 8    On a scale of 0-10, during the past 24 hours, pain has affected your mood: 6     On a scale of 0-10, during the past 24 hours, pain has contributed to your stress: 6     On a scale of 0-10, what is your overall pain  Rating: 8         IMPRESSION      ICD-10-CM    1. Segmental dysfunction of cervical region  M99.01       2. 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 Segmental dysfunction of lumbar region  M99.03       5. Lumbar pain with radiation down right leg  M54.50     M79.604       6. Segmental dysfunction of sacral region  M99.04       7. Segmental dysfunction of thoracic region  M99.02       8. Myalgia  M79.10             TREATMENT PLAN  Utilize chiropractic manipulation [phone] or [phone]) to improve joint mobility and function, soft tissue techniques to improve soft tissue mobility/flexibility (97140), and therapeutic exercise to improve strength, stability, global range of motion and proper movement patterns (97110, 97530).  Adjunct therapies may include taping or bracing procedures and activities of daily living training (09811) as needed to support the results of the other interventions.    Specific focus: Full spine.    Schedule of care: 2x/week, 4 weeks    Short term goals: Decrease pain from an 8/10 to a 6/10 in 4 weeks.                              Increase cervical and lumbar ROM in order to perform ADL with less pain in 4 weeks.    Long term goals: Independent with HEP in 8 weeks.    TODAY'S TREATMENT  Chiropractic manipulation of T5, L5, and the left SI with drop piece to improve joint function and mobility.   Start movement therapy next visit.  Pt tolerated today's visit well.    CPT Codes: 91478 and [phone].    Arlyn Leak, DC

## 2023-03-29 ENCOUNTER — Ambulatory Visit (INDEPENDENT_AMBULATORY_CARE_PROVIDER_SITE_OTHER): Payer: Self-pay

## 2023-03-29 NOTE — Telephone Encounter (Signed)
Patient was seen in Urgent Care and received gtts and medications. Patient stated that she was told to book an appointment with ophthalmologist. Patient booked with Dr. Fran Lowes for tomorrow. Informed patient if she needs to be seen sooner to please come to [facility] ER to see our on call-ophthalmologist.

## 2023-03-29 NOTE — Telephone Encounter (Signed)
-----   Message from Lars Pinks sent at 03/29/2023  9:45 AM EDT -----  Patient has shingles in her right eye and would like to speak to a nurse. [phone]

## 2023-03-30 ENCOUNTER — Encounter (INDEPENDENT_AMBULATORY_CARE_PROVIDER_SITE_OTHER): Payer: Self-pay | Admitting: Ophthalmology

## 2023-03-30 ENCOUNTER — Ambulatory Visit: Payer: Medicare (Managed Care) | Attending: Cornea and External Diseases Specialist | Admitting: Ophthalmology

## 2023-03-30 ENCOUNTER — Other Ambulatory Visit: Payer: Self-pay

## 2023-03-30 DIAGNOSIS — H02051 Trichiasis without entropian right upper eyelid: Secondary | ICD-10-CM | POA: Insufficient documentation

## 2023-03-30 DIAGNOSIS — H2512 Age-related nuclear cataract, left eye: Secondary | ICD-10-CM | POA: Insufficient documentation

## 2023-03-30 DIAGNOSIS — H02052 Trichiasis without entropian right lower eyelid: Secondary | ICD-10-CM | POA: Insufficient documentation

## 2023-03-30 DIAGNOSIS — H5711 Ocular pain, right eye: Secondary | ICD-10-CM | POA: Insufficient documentation

## 2023-03-30 DIAGNOSIS — Z961 Presence of intraocular lens: Secondary | ICD-10-CM | POA: Insufficient documentation

## 2023-03-30 NOTE — Procedures (Signed)
[facility]  [address]  [place] [address]  Operated by [facility]  Procedure Note    Name: Sherald Balbuena MRN:  [id number]   Date: 03/30/2023 DOB:  1956-07-26 (67 y.o.)         91478 - CORRECTION OF TRICHIASIS; EPILATION, BY FORCEPS ONLY (AMB ONLY)    Performed by: Guffey, [place], MD  Authorized by: Dorothyann Peng, MD    Time Out:     Immediately before the procedure, a time out was called:  Yes    Patient verified:  Yes    Procedure Verified:  Yes    Site Verified:  Yes    A drop of proparacaine was instilled in the right eye. Forceps were used to epilate 2 RUL lashes and one lower lid lash. Patient tolerated the procedure well and without complications.     [place] Guffey, MD    Teaching physician was present and supervised the entire procedure.    Dorothyann Peng, MD 04/01/2023, 11:46

## 2023-03-30 NOTE — Progress Notes (Addendum)
[facility]  [address]  [place] [address]  Operated by [facility]         Patient Name: [name]  MRN#: [id number]  Birthdate: 12-13-1955    Date of Service: 03/30/2023    Chief Complaint    Shingles         [name] is a 67 y.o. female who presents today for evaluation/consultation of:  HPI    Pt is here sent by urgent care for shingles  Pt notes episode started happening Saturday morning 03/26/2023, felt bad pain when air conditioner was on and then started to feel worse after went to urgent care 03/29/2023 told has bumps on upper lid and told to go to eye doctor    Pt states VA OD is blurry/hazy  Pt states having pain around eye and down face. Pt notes OD feels very irritated.   Pt states woke up today with "bad HA above eye" "feels like eye is swollen"  Pt states photophobic OD  Pt denies any FOL, new/abnormal floaters   Pt denies any nausea    Pt given Valacyclovir 1g TID for 7 days   Tobramycin TID OD     Last edited by Earleen Newport, Ophthalmic Testing Assistant on 03/30/2023  8:49 AM.        ROS    Positive for: Eyes  Negative for: Constitutional, Gastrointestinal, Neurological, Skin, Genitourinary, Musculoskeletal, HENT, Endocrine, Cardiovascular, Respiratory, Psychiatric, Allergic/Imm, Heme/Lymph  Last edited by Earleen Newport, Ophthalmic Testing Assistant on 03/30/2023  8:49 AM.         All other systems Negative    Earleen Newport, Ophthalmic Testing Assistant  03/30/2023, 08:50        Base Eye Exam       Visual Acuity (Snellen - Linear)         Right Left    Dist sc 20/50 20/60    Dist ph sc 20/40 20/25   Pt notes having glasses for reading, and has a spot OD, "only wear glasses when reading up close, do not need glasses to see at a distance minus that spot getting in the way in the right eye"             Pupils         Shape React APD    Right Round Slow None    Left Round Slow None              Visual Fields         Right Left     Full Full               Extraocular Movement         Right Left     Full Full              Neuro/Psych       Oriented x3: Yes    Mood/Affect: Normal                  Slit Lamp and Fundus Exam       External Exam         Right Left    External Normal Lower eyelid inf to the lash line with few mildly elevated lesions on the the skin, no focal lash loss, no external skin blisters in the para orbital region              Slit Lamp Exam  Right Left    Lids/Lashes Trichiasis x 2 Lateral canthus, Trichiasis x 2 upper lid, all three lashes are rubbing on the conjunctiva 1+ Telangiectasia    Conjunctiva/Sclera 1+ Injection, Conjunctival staining temporally White and quiet    Cornea Clear, No epi defects, No scarring Clear, trace PEE    Anterior Chamber Deep and quiet Deep and quiet    Iris Round and reactive Round and reactive    Lens Posterior chamber intraocular lens 2+ NS    Vitreous Normal Normal                    MD Addition to HPI: Pt is here sent by urgent care for shingles. Pt notes episode started happening Saturday morning 03/26/23. Pt states VA is blurry /hazy. Having pain around eye and down the face. Notes OD feels very irritated. "Bad HA above the eye", "feels like eye is swollen". Given Valacyclovir 1g TID for 7 days and tobramycin TID OD.          ENCOUNTER DIAGNOSES     ICD-10-CM   1. 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 Trichiasis of right upper eyelid  H02.051   3. Acute right eye pain  H57.11   4. Pseudophakia, right eye  Z96.1   5. Nuclear sclerosis, left  H25.12     Orders Placed This Encounter   Procedures    [phone] - CORRECTION OF TRICHIASIS; EPILATION, BY FORCEPS ONLY (AMB ONLY)       Ophthalmic Plan of Care:    1) Trichiasis OD  - VASC 20/50 OD // 20/60 OS   - Several trichiatic lashes in lateral canthus.  Epilated trichiatic lashes today.  See Procedure Note.  - Discussed with patient potential long-term options for trichiatic lashes.  Pt has appt with Dr. Cyndie Chime in July.  Instructed patient to  discuss with Dr. Cyndie Chime about possible options.  - No evidence of shingles. Stop gtts and Valacyclovir      Follow up:    I have asked Larken Smoley to follow up with Dr. Cyndie Chime 04/26/23           I have seen and examined the above patient. I discussed the above diagnoses listed in the assessment and the above ophthalmic plan of care with the patient and patient's family. All questions were answered. I reviewed and, when necessary, made changes to the technician/resident note, documented ophthalmology exam, chief complaint, history of present illness, allergies, review of systems, past medical, past surgical, family and social history. I personally reviewed and interpreted all testing and/or imaging performed at this visit and agree with the resident's or fellow's interpretation. Any exceptions/additions are edited/noted in the relevant encounter fields.      I am scribing for, and in the presence of, Dr. Fran Lowes for services provided on 03/30/2023.  Lorelee Cover, SCRIBE   [place], SCRIBE  03/30/2023, 09:04  [place] Guffey, MD 03/30/2023 09:38    I saw and examined the patient.  I reviewed the resident's/fellow's note.  I agree with the findings and plan of care as documented in the resident's note.  Any exceptions/additions are edited/noted.  I have reviewed and confirmed the ROS, PFSH, and exam elements performed and documented by the technician. The scribed portion of the progress note was scribed on my behalf and at my direction. I have reviewed and attest to the accuracy of the note.     Dorothyann Peng, MD 03/30/2023, 11:58    I  have seen and examined the above patient. I discussed the above diagnoses listed in the assessment and the above ophthalmic plan of care with the patient and patient's family. All questions were answered. I reviewed and, when necessary, made changes to the technician/resident note, documented ophthalmology exam, chief complaint, history of present illness, allergies, review of systems, past  medical, past surgical, family and social history.    Orders Placed This Encounter   Procedures    [phone] - CORRECTION OF TRICHIASIS; EPILATION, BY FORCEPS ONLY (AMB ONLY)       Orders Placed This Encounter    CANCELED: 95284 - CORRECTION OF TRICHIASIS; EPILATION, BY FORCEPS ONLY (AMB ONLY)    13244 - CORRECTION OF TRICHIASIS; EPILATION, BY FORCEPS ONLY (AMB ONLY)

## 2023-04-05 ENCOUNTER — Inpatient Hospital Stay (HOSPITAL_COMMUNITY)
Admission: RE | Admit: 2023-04-05 | Discharge: 2023-04-05 | Disposition: A | Discharge status: Home / Self Care | Payer: Medicare (Managed Care) | Source: Ambulatory Visit

## 2023-04-05 ENCOUNTER — Encounter (HOSPITAL_COMMUNITY): Payer: Self-pay

## 2023-04-05 ENCOUNTER — Encounter (HOSPITAL_COMMUNITY): Payer: Self-pay | Admitting: Gastroenterology

## 2023-04-05 ENCOUNTER — Other Ambulatory Visit (INDEPENDENT_AMBULATORY_CARE_PROVIDER_SITE_OTHER): Payer: Self-pay | Admitting: Nurse Practitioner

## 2023-04-05 DIAGNOSIS — Z1211 Encounter for screening for malignant neoplasm of colon: Secondary | ICD-10-CM

## 2023-04-05 HISTORY — DX: Personal history of other diseases of the nervous system and sense organs: Z86.69

## 2023-04-05 MED ORDER — PEG 3350-ELECTROLYTES 236 GRAM-22.74 GRAM-6.74 GRAM-5.86 GRAM SOLUTION
4.0000 L | Freq: Once | ORAL | 0 refills | Status: AC
Start: 2023-04-05 — End: 2023-04-05

## 2023-04-06 ENCOUNTER — Other Ambulatory Visit: Payer: Self-pay

## 2023-04-06 ENCOUNTER — Ambulatory Visit (INDEPENDENT_AMBULATORY_CARE_PROVIDER_SITE_OTHER): Payer: Self-pay

## 2023-04-06 ENCOUNTER — Ambulatory Visit (INDEPENDENT_AMBULATORY_CARE_PROVIDER_SITE_OTHER): Payer: Medicare (Managed Care) | Admitting: Chiropractor

## 2023-04-06 DIAGNOSIS — M9904 Segmental and somatic dysfunction of sacral region: Secondary | ICD-10-CM

## 2023-04-06 DIAGNOSIS — M9902 Segmental and somatic dysfunction of thoracic region: Secondary | ICD-10-CM

## 2023-04-06 DIAGNOSIS — M5090 Cervical disc disorder, unspecified, unspecified cervical region: Secondary | ICD-10-CM

## 2023-04-06 DIAGNOSIS — M9903 Segmental and somatic dysfunction of lumbar region: Secondary | ICD-10-CM

## 2023-04-06 DIAGNOSIS — M545 Low back pain, unspecified: Secondary | ICD-10-CM

## 2023-04-06 DIAGNOSIS — M791 Myalgia, unspecified site: Secondary | ICD-10-CM

## 2023-04-06 DIAGNOSIS — M9901 Segmental and somatic dysfunction of cervical region: Secondary | ICD-10-CM

## 2023-04-06 DIAGNOSIS — M79604 Pain in right leg: Secondary | ICD-10-CM

## 2023-04-06 DIAGNOSIS — M47812 Spondylosis without myelopathy or radiculopathy, cervical region: Secondary | ICD-10-CM

## 2023-04-06 NOTE — Progress Notes (Signed)
[facility]  [address]  [place] [address]  [phone]        Name: [name]  MRN: [id number]  Date:04/06/2023  Birthday: [birth year]      Chief Complaint   Patient presents with    Neck Pain    Back Pain    Myalgia         Subjective:  [name] presents for follow up care. She states that her neck/back are still pretty tight and sore this morning, she rates her pain an 8/10.    Objective:       [place] Pain Rating Scale     On a scale of 0-10, during the past 24 hours, pain has interfered with you usual activity: 9     On a scale of 0-10, during the past 24 hours, pain has interfered with your sleep: 9    On a scale of 0-10, during the past 24 hours, pain has affected your mood: 8     On a scale of 0-10, during the past 24 hours, pain has contributed to your stress: 9     On a scale of 0-10, what is your overall pain Rating: 8      Joint mobility restrictions: C1-C2, C7-T1, T4-T5, T5-T6, L5-S1, right upper sacroliliac  Tender points/Trigger points: Cervical paraspinals/ upper traps and lumbar paraspinals.  Hypertonicity: Cervical paraspinals/ upper traps and lumbar paraspinals.      Assessment:       ICD-10-CM    1. Segmental dysfunction of cervical region  M99.01       2. 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 Segmental dysfunction of lumbar region  M99.03       5. Lumbar pain with radiation down right leg  M54.50     M79.604       6. Segmental dysfunction of sacral region  M99.04       7. Segmental dysfunction of thoracic region  M99.02       8. Myalgia  M79.10           Plan:  Continue with care as outlined in the treatment plan:      Today's treatment:    Chiropractic manipulation of T4-T6, L5 and the right SI with drop piece to improve joint function and mobility.   Pt going to movement therapy.  Pt tolerated today's visit  well.    CPT Code: 29528.    Arlyn Leak, DC

## 2023-04-06 NOTE — Progress Notes (Signed)
[facility]  [address]       [place] [address]  [phone]            Name:[name]  ZHY:[id number]  Date:04/06/2023  Birthday:[birth year]         TODAY'S TREATMENT    Treatment included:  Consultation  Therapeutic exercise    The pt presented today per referral from Dr. Merrilee Jansky, DC for THERAPEUTIC EXERCISES.    67 year old female  Referred for/Location of symptoms: Neck and Low Back Pain    The pt confirmed and reported the following information:  She had injections on 03-18-2023 with Dr. Marnette Burgess. She reported she is doing okay.  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  She reported cervical spinal stenosis, Thoracic disc bulge, and lumbar SIJ pain.     The following HPI Note is from Dr. Merrilee Jansky, DC on 03-28-2023:  HPI  This patient is a 67 y.o. year old female who presents for evaluation and management of  constant chronic neck pain which intermittently radiates down her arm and into her hands. She states that her neck has been bothering her for approximately 5 years. 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 She states that her LBP intermittently radiates down her legs and into her feet. She states that she recently had [facility] for her neck pain. She states that the TPI's have helped a little. She states that she is under the care of a Neurosurgeon and the neurosurgeon did nerve blocks in the past. She states that the first nerve blocked helped for a few months and the second nerve block helped for 2 weeks. She denies having any fractures, spinal surgeries, or bowel/bladder incontinence.     Duration of symptoms:  Neck pain for approximately 5 years. LBP for more than 25 years.    Onset: insidious  Quality: sharp, dull, burning, deep, shooting, numbness, tingling  Frequency: continuous  Timing: the symptom is not worse  at any particular time of day  Provocative: moving the neck, driving, standing, walking, lifting, sitting, getting up from a seated position, changing positions, exercising, laying on side in bed  Palliative: resting, heat, pain medication  Radiation/Referral: yes, intermittently down her arms and into her hands. Intermittently down her legs and into her feet.  Pain with coughing/sneezing/using bathroom: YES , it sometimes increases her pain.  Bowel/bladder dysfunction: no  Fever/chills: no  Extremity numbness/tingling: YES , intermittently down her arms and legs.  Extremity weakness: no   Affected activities of daily living:  Can perform, but it sometimes increases her pain.  Past treatments and results:     Past treatments: medications, injections, physical therapy                                                      Good results with medications, injections  Poor or minimal results with physical therapy  Imaging for this complaint: x-ray, MRI, performed at [facility]  Recent accidents or surgeries: no  ______________________________________________________________    EXERCISE PLAN:  Continue HEP. Stop exercises if pain or discomfort increases.  Return next visit to begin Corrective Exercises - [facility].    [email or web address]  Access Code: [email or web address]    2x/day each day    Exercises     Wt   Set   Rep   Duration/Notes   LTR  1 10 few seconds hold   Supine Crossover Stretch  1 2 30" holds; B/L bent knee   Seated Thoracic EXT towel  1 5 10" holds   Seated Chin Tuck Retraction  1 10 3" holds                              <<< = NEW EXERCISE / INCREASED  HELD = NOT COMPLETED   = DISCHARGED    B = blue, Y = yellow, R = red, G = green, Bk = black   Db = dumbbell, T = theraband, CF = cuff weight, M = medicine ball, Kb = kettle bell, PB = physio-ball, K = keiser machine, TM = treadmill  B/L = bilateral, S/L = single-leg, LTR = lateral trunk rotation      TREATMENT TIME:  Total Time: 30  minutes  Intake/Programming: 10 minutes  Exercise: 20 minutes    On the day of the encounter, a total of 30 minutes was spent on this patient encounter including review of HPI note information, therapeutic exercise, and post-visit goals.    Robb Matar, EP-C 04/06/2023, 10:43

## 2023-04-11 ENCOUNTER — Other Ambulatory Visit (HOSPITAL_BASED_OUTPATIENT_CLINIC_OR_DEPARTMENT_OTHER): Payer: Self-pay | Admitting: Physician Assistant

## 2023-04-11 ENCOUNTER — Ambulatory Visit (INDEPENDENT_AMBULATORY_CARE_PROVIDER_SITE_OTHER): Payer: Medicare (Managed Care) | Admitting: Chiropractor

## 2023-04-11 ENCOUNTER — Encounter (INDEPENDENT_AMBULATORY_CARE_PROVIDER_SITE_OTHER): Payer: Self-pay | Admitting: PHYSICIAN ASSISTANT

## 2023-04-11 ENCOUNTER — Ambulatory Visit: Payer: Medicare (Managed Care) | Attending: PHYSICIAN ASSISTANT | Admitting: PHYSICIAN ASSISTANT

## 2023-04-11 ENCOUNTER — Other Ambulatory Visit: Payer: Self-pay

## 2023-04-11 VITALS — BP 144/86 | HR 74 | Temp 98.4°F | Resp 20 | Ht 64.02 in | Wt 221.3 lb

## 2023-04-11 DIAGNOSIS — M545 Low back pain, unspecified: Secondary | ICD-10-CM

## 2023-04-11 DIAGNOSIS — M9901 Segmental and somatic dysfunction of cervical region: Secondary | ICD-10-CM | POA: Insufficient documentation

## 2023-04-11 DIAGNOSIS — M9903 Segmental and somatic dysfunction of lumbar region: Secondary | ICD-10-CM

## 2023-04-11 DIAGNOSIS — M5090 Cervical disc disorder, unspecified, unspecified cervical region: Secondary | ICD-10-CM

## 2023-04-11 DIAGNOSIS — M9904 Segmental and somatic dysfunction of sacral region: Secondary | ICD-10-CM

## 2023-04-11 DIAGNOSIS — M791 Myalgia, unspecified site: Secondary | ICD-10-CM | POA: Insufficient documentation

## 2023-04-11 DIAGNOSIS — M9902 Segmental and somatic dysfunction of thoracic region: Secondary | ICD-10-CM

## 2023-04-11 DIAGNOSIS — M47812 Spondylosis without myelopathy or radiculopathy, cervical region: Secondary | ICD-10-CM

## 2023-04-11 DIAGNOSIS — M79604 Pain in right leg: Secondary | ICD-10-CM

## 2023-04-11 DIAGNOSIS — M542 Cervicalgia: Secondary | ICD-10-CM | POA: Insufficient documentation

## 2023-04-11 NOTE — Nursing Note (Signed)
Procedure Follow Up  [name]  [id number]  Chief Complaint   Patient presents with    Neck Pain       [place] Pain Rating Scale     On a scale of 0-10, during the past 24 hours, pain has interfered with you usual activity: 9     On a scale of 0-10, during the past 24 hours, pain has interfered with your sleep: 7    On a scale of 0-10, during the past 24 hours, pain has affected your mood: 8     On a scale of 0-10, during the past 24 hours, pain has contributed to your stress: 9     On a scale of 0-10, what is your overall pain Rating: 8      Vitals:    04/11/23 1435   BP: (!) 144/86   Pulse: 74   Resp: 20   Temp: 36.9 C (98.4 F)   SpO2: 98%   Weight: 100 kg (221 lb 5.5 oz)   Height: 1.626 m (5' 4.02")   PainSc:   8   PainLoc: Neck     Body mass index is 37.97 kg/m.    New imaging:  denies  Patient is here today S/ P  TPIs that was done on 03-18-23 and reports 40 % relief that lasted 4 weeks.    [name]  04/11/2023, 14:38

## 2023-04-11 NOTE — Progress Notes (Signed)
[facility]  [address]  [place] [address]  [phone]        Name: [name]  MRN: [id number]  Date:04/11/2023  Birthday: 1956-04-05      Chief Complaint   Patient presents with    Neck Pain    Back Pain    Myalgia         Subjective:  [name] presents for follow up care. She states that her neck/back are still pretty tight and sore this afternoon, she rates her pain an 8/10. She states that they are going to schedule her for more injections at the Pain Clinic.    Objective:       [place] Pain Rating Scale     On a scale of 0-10, during the past 24 hours, pain has interfered with you usual activity: 8     On a scale of 0-10, during the past 24 hours, pain has interfered with your sleep: 7    On a scale of 0-10, during the past 24 hours, pain has affected your mood: 9     On a scale of 0-10, during the past 24 hours, pain has contributed to your stress: 8     On a scale of 0-10, what is your overall pain Rating: 8      Joint mobility restrictions: C1-C2, C7-T1, T4-T5, T5-T6, L5-S1, right upper sacroliliac  Tender points/Trigger points: Cervical paraspinals/ upper traps and lumbar paraspinals.  Hypertonicity: Cervical paraspinals/ upper traps and lumbar paraspinals.      Assessment:       ICD-10-CM    1. Segmental dysfunction of cervical region  M99.01       2. 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 Segmental dysfunction of lumbar region  M99.03       5. Lumbar pain with radiation down right leg  M54.50     M79.604       6. Segmental dysfunction of sacral region  M99.04       7. Segmental dysfunction of thoracic region  M99.02           Plan:  Continue with care as outlined in the treatment plan:      Today's treatment:    Chiropractic manipulation of T4-T6, L5 and the right SI with drop piece to improve joint function and mobility.   Pt  tolerated today's visit well and she reported feeling a little better.    CPT Code: 95621.    Arlyn Leak, DC

## 2023-04-11 NOTE — Progress Notes (Signed)
[facility]  [address], [address]  [place], [place] [address]  [phone]    Progress Note    [name]  MRN: [id number]  DOB: 28-Nov-1955  Date of Service: 04/11/2023    CHIEF COMPLAINT  Chief Complaint   Patient presents with    Neck Pain       SUBJECTIVE  [name] is a 67 y.o. female who presents to clinic for procedure follow up. Patient had TPI's Trigger Point Injection 4 muscle groups B C paravertebral and B Trapezii/SS performed on 03/18/23 with Dr. Marnette Burgess. Patient endorses 80% relief 4 week(s). Denies any side effects or complication with the procedure.  She reports continued HEP reporting an increase in cervical ROM. Patient reports improved lifting, greater function due to decreased pain.       Medications:   albuterol sulfate (PROAIR HFA) 90 mcg/actuation Inhalation oral inhaler, Take 2 Puffs by inhalation Every 4 hours as needed  baclofen (LIORESAL) 5 mg Oral Tablet, Take 1 Tablet (5 mg total) by mouth Three times a day as needed  clobetasoL (TEMOVATE) 0.05 % Ointment, Apply topically Twice daily  desvenlafaxine (PRISTIQ) 100 mg Oral Tablet Sustained Release 24 hr, Take 1 Tablet (100 mg total) by mouth Once a day  diclofenac sodium (VOLTAREN) 1 % Gel, Apply topically Four times a day - before meals and bedtime  fluticasone propionate (FLONASE) 50 mcg/actuation Nasal Spray, Suspension, ADMINISTER 2 SPRAYS INTO EACH NOSTRIL ONCE A DAY. (Patient taking differently: Administer 2 Sprays into each nostril Once a day Takes in am)  gabapentin (NEURONTIN) 800 mg Oral Tablet, Take 1 Tablet (800 mg total) by mouth Twice daily  nitroGLYCERIN (NITROSTAT) 0.4 mg Sublingual Tablet, Sublingual, Place 1 Tablet (0.4 mg total) under the tongue Every 5 minutes as needed for Chest pain for 3 doses over 15 minutes  ondansetron (ZOFRAN) 4 mg Oral Tablet, Take 1 Tablet (4 mg total) by mouth Every 8 hours as needed for Nausea/Vomiting  pantoprazole (PROTONIX) 40 mg Oral  Tablet, Delayed Release (E.C.), Take 1 Tablet (40 mg total) by mouth Once a day Takes in am  tiotropium bromide (SPIRIVA HANDIHALER) 18 mcg Inhalation Capsule, w/Inhalation Device, Take 1 Capsule (18 mcg total) by inhalation Once a day  umeclidinium (INCRUSE ELLIPTA) 62.5 mcg/actuation Inhalation oral inhaler, Take 1 Inhalation by inhalation Once a day  HYDROcodone-acetaminophen (NORCO) 5-325 mg Oral Tablet, Take 1 Tablet by mouth Every 6 hours as needed for Pain (Patient not taking: Reported on 03/15/2023)  metroNIDAZOLE (FLAGYL) 500 mg Oral Tablet, Take 1 Tablet (500 mg total) by mouth Three times a day (Patient not taking: Reported on 04/05/2023)  trimethoprim-sulfamethoxazole (BACTRIM DS) 160-800mg  per tablet, Take 1 Tablet (160 mg total) by mouth Every 12 hours (Patient not taking: Reported on 03/18/2023)    No facility-administered medications prior to visit.      Allergies:   Allergies   Allergen Reactions    Cymbalta [Duloxetine]      Rash on feet and hands    Blue Dye      Told to avoid due to patch test result by Derm Cochrane    Flavoring Agent  Other Adverse Reaction (Add comment)     Patient unsure of reaction    Lipitor [Atorvastatin] Myalgia     Muscle pain    Nickel      Blisters  "Allergic to all kinds of metal"     Codeine Itching     RASH    Mobic [Meloxicam]  Nausea/ Vomiting     Nervous and shakes     Naprosyn [Naproxen] Nausea/ Vomiting    Nsaids (Non-Steroidal Anti-Inflammatory Drug) Nausea/ Vomiting       BASIC METABOLIC PANEL  Lab Results   Component Value Date    SODIUM 137 09/14/2022    POTASSIUM 3.8 09/14/2022    CHLORIDE 108 09/14/2022    CO2 21 (L) 09/14/2022    ANIONGAP 8 09/14/2022    BUN 16 09/14/2022    CREATININE 0.99 09/14/2022    BUNCRRATIO 16 09/14/2022    GFR 63 09/14/2022    CALCIUM 9.2 09/14/2022    GLUCOSE 93 09/14/2022    GLUCOSENF 101 03/05/2019        CBC  Diff   Lab Results   Component Value Date/Time    WBC 6.4 09/14/2022 11:25 AM    HGB 13.9 09/14/2022 11:25 AM    HCT 42.0  09/14/2022 11:25 AM    PLTCNT 388 09/14/2022 11:25 AM    SEDRATE 30 03/29/2008 12:42 PM    RBC 4.88 09/14/2022 11:25 AM    MCV 86.1 09/14/2022 11:25 AM    MCHC 33.1 09/14/2022 11:25 AM    MCH 28.5 09/14/2022 11:25 AM    RDW 14.5 12/12/2017 03:52 PM    MPV 9.2 09/14/2022 11:25 AM    Lab Results   Component Value Date/Time    PMNS 60 09/14/2022 11:25 AM    LYMPHOCYTES 28 12/12/2017 03:52 PM    EOSINOPHIL 2 12/12/2017 03:52 PM    MONOCYTES 9 09/14/2022 11:25 AM    BASOPHILS 1 09/14/2022 11:25 AM    BASOPHILS <0.10 09/14/2022 11:25 AM    PMNABS 3.80 09/14/2022 11:25 AM    LYMPHSABS 1.78 09/14/2022 11:25 AM    EOSABS 0.11 09/14/2022 11:25 AM    MONOSABS 0.60 09/14/2022 11:25 AM    BASOSABS 0.085 11/28/2014 04:49 PM          Lab Results   Component Value Date    HA1C 5.4 02/19/2016       Review of Systems:   CONSTITUTIONAL: Denies weight loss. Denies fever.   GI: Denies bowel incontinence.  GU: Denies urinary incontinence or retention.  NEUROLOGICAL: Denies headaches.   PSYCHIATRIC: Denies depression or anxiety. Denies acute changes in mood.  MUSCULOSKELETAL: See HPI.    OBJECTIVE  BP (!) 144/86   Pulse 74   Temp 36.9 C (98.4 F)   Resp 20   Ht 1.626 m (5' 4.02")   Wt 100 kg (221 lb 5.5 oz)   SpO2 98%   BMI 37.97 kg/m       General: no distress   Abdomen: soft, non-tender and non-distended   Respiratory: No increased work of breathing. No increased accessory muscle use   Cardiovascular: acyanotic, no JVD  Skin: warm and dry, no rash  Neurologic: gait is normal , motor 5/5 and sensory intact in b/l LEs, AOx3, CN 2-12 grossly intact, negative Hoffman's b/l  Deep Tendon Reflexes    Brachioradialis Bicep Patellar Achilles   Right  2+ 2+ 2+ 2+   Left 2+ 2+ 2+ 2+     Psychiatric: normal affect and behavior  Musculoskeletal: no cyanosis or edema  Cervical   ROM: Limited rotation   Palpation:  Tender   Motor: 5/5 in UEs b/l   Sensory: Intact in UEs b/l   Facet loading: Negative bilaterally    Facet TTP: Negative  bilaterally    Trigger points: Cervical paraspinal bilateral and Trapezius bilateral  Spurling's: Positive bilaterally       Imaging: Reviewed    Recent Results (from the past [id number] hour(s))   MRI SPINE CERVICAL WO CONTRAST    Collection Time: 06/19/22 11:21 AM    Narrative    [name]  Female, 67 years old.    MRI SPINE CERVICAL WO CONTRAST performed on 06/19/2022 11:21 AM.    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 There is mild loss of disc height at all the levels along with mild generalized facet and uncovertebral arthropathy.    The C2-C3, C3-C4 and C4-C5 levels demonstrate patent spinal canal and neural foramina. Mild disc bulge is present at C4-C5.    The C5-C6 and C6-C7 levels demonstrate disc bulge resulting in mild spinal stenosis. There is significant right-sided foraminal encroachment at C5-C6 and mild bilateral foraminal encroachment at C6-C7.    The C7-T1 level demonstrates patent spinal canal and neural foramina.    The spinal cord demonstrates preserved caliber and signal and prevertebral paraspinal soft tissues are unremarkable.      Impression    Mild cervical spondylosis relatively more focal at the mid cervical spine at the C5-C6 and C6-C7 levels. Findings are similar to the prior MRI examination.          ASSESSMENT    ICD-10-CM    1. Myalgia  M79.10 20553 - INJ SINGLE OR MULTIPLE TRIGGER POINTS, 3 OR MORE MUSCLE -No Imaging; 44010 Prior Auth/Scheduling Procedure     20553 - INJ SINGLE OR MULTIPLE TRIGGER POINTS, 3 OR MORE MUSCLE (AMB ONLY)     Refer to [facility]      2. Neck pain  M54.2 20553 - INJ SINGLE OR MULTIPLE TRIGGER POINTS, 3 OR MORE MUSCLE -No Imaging; 27253 Prior Auth/Scheduling Procedure      20553 - INJ SINGLE OR MULTIPLE TRIGGER POINTS, 3 OR MORE MUSCLE (AMB ONLY)     Refer to [facility]      3. Segmental dysfunction of cervical region  M99.01 20553 - INJ SINGLE OR MULTIPLE TRIGGER POINTS, 3 OR MORE MUSCLE -No Imaging; 66440 Prior Auth/Scheduling Procedure     20553 - INJ SINGLE OR MULTIPLE TRIGGER POINTS, 3 OR MORE MUSCLE (AMB ONLY)     Refer to [facility]      4. 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 Order placed for repeat TPI's,  Trigger Point Injection 4 muscle groups B C paravertebral and B Trapezii/SS followed by medical massage.  2. Medical massage to follow TPI    Follow up after injections    Our impression,  treatment recommendations and plan from today's visit were reviewed in detail with the patient in the office. All of the patient's questions were answered. The patient is in agreement with our plan. The procedure was explained using a spine model,  including technique, benefits, alternatives and the associated risks.  The patient verbalized understanding of the above plan and the patient wishes to move forward with the above noted plan.    Beverly Gust, PA-C 04/11/2023, 14:52

## 2023-04-19 ENCOUNTER — Ambulatory Visit (INDEPENDENT_AMBULATORY_CARE_PROVIDER_SITE_OTHER): Payer: Self-pay | Admitting: Rehabilitative and Restorative Service Providers"

## 2023-04-19 ENCOUNTER — Encounter (INDEPENDENT_AMBULATORY_CARE_PROVIDER_SITE_OTHER): Payer: MEDICAID | Admitting: Chiropractor

## 2023-04-19 ENCOUNTER — Telehealth (HOSPITAL_BASED_OUTPATIENT_CLINIC_OR_DEPARTMENT_OTHER): Payer: Self-pay | Admitting: Physician Assistant

## 2023-04-19 NOTE — Nursing Note (Signed)
Transition of Care Contact Information  Discharge Date: 04/18/2023  Transition Facility Type--Hospital (Inpatient or Observation)  Facility [facility]  Interactive Contact(s): Completed or attempted contact indicated by Date/Time  Completed Contact: 04/19/2023  1:36 PM  Contact Method(s)-- Patient/Caregiver Telephone  Clinical Staff Name/Role who Verdon Cummins, RN  Transition Assessment  Discharge Summary obtained?--Yes  How are you recovering?--Improving  Discharge Meds obtained?--No  Medication Barriers  --Patient has not been to pharmacy yet--   Discharge medication changes reviewed?--Yes  Full Medication Reconciliation Completed?  Medication understanding --knows new medication(s)--knows purpose of medication  Medication Concerns?--No  Have everything needed for recovery?--Yes  Care Coordination:   Patient has transition follow-up appointment date and time?  Primary Care Transition Visit planned?--Yes  Specialist Transition Visit planned?  Patient/caregiver plans to attend transition visit?--Yes  Primary Follow-up Barrier  Interventions provided --reinforced discharge instructions--follow-up appointment date/time reinforced  Home Health or DME ordered at discharge?--No  Clinician/Team notified?--No  Primary reason clinician notified?  Transition Note:Spoke to patient regarding h/d f/u 06/23-06/24/2024 for Chest pain s/p heart cath at [facility].    Patient reports that she still has some mild chest pains off/on. She also reports having a non productive cough, but denies wheezing. She states that when she is lying flat, she gets short of breath, but it gets better when she turns on to her side.  She denies any nausea, vomiting or fevers.   She states that she is having diarrhea today.  Eating and drinking without any difficulty.  She plans on picking up her medications today from the pharmacy.    She is scheduled to f/u with cardiology [facility]) on 05/24/2023.    F/U  appointment scheduled on 05/09/2023 with Ovidio Hanger, PA.  Encouraged patient to call with any questions or concerns.  ED precautions reviewed.    Myna Bright, RN      Unable to Complete TCM due to:  Further information may be documented in relevant telephone or outreach encounter.

## 2023-04-26 ENCOUNTER — Ambulatory Visit (INDEPENDENT_AMBULATORY_CARE_PROVIDER_SITE_OTHER): Payer: Self-pay | Admitting: Ophthalmology

## 2023-05-02 ENCOUNTER — Ambulatory Visit (INDEPENDENT_AMBULATORY_CARE_PROVIDER_SITE_OTHER): Payer: MEDICAID | Admitting: Specialist

## 2023-05-03 ENCOUNTER — Telehealth (INDEPENDENT_AMBULATORY_CARE_PROVIDER_SITE_OTHER): Payer: Self-pay | Admitting: Specialist

## 2023-05-09 ENCOUNTER — Ambulatory Visit: Payer: Medicare (Managed Care) | Attending: Physician Assistant | Admitting: Physician Assistant

## 2023-05-09 ENCOUNTER — Encounter (HOSPITAL_BASED_OUTPATIENT_CLINIC_OR_DEPARTMENT_OTHER): Payer: Self-pay | Admitting: Physician Assistant

## 2023-05-09 ENCOUNTER — Other Ambulatory Visit: Payer: Self-pay

## 2023-05-09 VITALS — BP 134/78 | HR 91 | Temp 97.3°F | Ht 64.02 in | Wt 215.6 lb

## 2023-05-09 DIAGNOSIS — Z6835 Body mass index (BMI) 35.0-35.9, adult: Secondary | ICD-10-CM

## 2023-05-09 DIAGNOSIS — F332 Major depressive disorder, recurrent severe without psychotic features: Secondary | ICD-10-CM | POA: Insufficient documentation

## 2023-05-09 DIAGNOSIS — Z955 Presence of coronary angioplasty implant and graft: Secondary | ICD-10-CM | POA: Insufficient documentation

## 2023-05-09 DIAGNOSIS — E785 Hyperlipidemia, unspecified: Secondary | ICD-10-CM | POA: Insufficient documentation

## 2023-05-09 DIAGNOSIS — I10 Essential (primary) hypertension: Secondary | ICD-10-CM

## 2023-05-09 DIAGNOSIS — Z131 Encounter for screening for diabetes mellitus: Secondary | ICD-10-CM | POA: Insufficient documentation

## 2023-05-09 DIAGNOSIS — I2510 Atherosclerotic heart disease of native coronary artery without angina pectoris: Secondary | ICD-10-CM | POA: Insufficient documentation

## 2023-05-09 DIAGNOSIS — G629 Polyneuropathy, unspecified: Secondary | ICD-10-CM | POA: Insufficient documentation

## 2023-05-09 DIAGNOSIS — Z0000 Encounter for general adult medical examination without abnormal findings: Secondary | ICD-10-CM | POA: Insufficient documentation

## 2023-05-09 DIAGNOSIS — R059 Cough, unspecified: Secondary | ICD-10-CM | POA: Insufficient documentation

## 2023-05-09 DIAGNOSIS — I219 Acute myocardial infarction, unspecified: Secondary | ICD-10-CM | POA: Insufficient documentation

## 2023-05-09 HISTORY — DX: Acute myocardial infarction, unspecified: I21.9

## 2023-05-09 HISTORY — DX: Presence of coronary angioplasty implant and graft: Z95.5

## 2023-05-09 MED ORDER — LOSARTAN 25 MG TABLET
25.0000 mg | ORAL_TABLET | Freq: Every day | ORAL | 4 refills | Status: DC
Start: 2023-05-09 — End: 2023-06-20

## 2023-05-09 NOTE — Patient Instructions (Signed)
Medicare Preventive Services  Medicare coverage information Recommendation for YOU   Heart Disease and Diabetes   Lipid profile Every 5 years or more often if at risk for cardiovascular disease     Lab Results   Component Value Date    CHOLESTEROL 205 (H) 02/15/2022    HDLCHOL 42 (L) 02/15/2022    LDLCHOL 137 (H) 02/15/2022    TRIG 145 02/15/2022         Diabetes Screening    Yearly for those at risk for diabetes, 2 tests per year for those with prediabetes Last Glucose:      Diabetes Self Management Training or Medical Nutrition Therapy  For those with diabetes, up to 10 hrs initial training within a year, subsequent years up to 2 hrs of follow up training Optional for those with diabetes     Medical Nutrition Therapy  Three hours of one-on-one counseling in first year, two hours in subsequent years Optional for those with diabetes, kidney disease   Intensive Behavioral Therapy for Obesity  Face-to-face counseling, first month every week, month 2-6 every other week, month 7-12 every month if continued progress is documented Optional for those with Body Mass Index 30 or higher  Your Body mass index is 36.99 kg/m.   Tobacco Cessation (Quitting) Counseling   Covers up to 8 smoking and tobacco-use cessation counseling sessions in a 16-month period.    Optional for those that use tobacco   Cancer Screening Last Completion Date   Colorectal screening   For anyone age 49 to 74 or any age if high risk:  Screening Colonoscopy every 10 yrs if low risk,  more frequent if higher risk  OR  Cologuard Stool DNA test once every 3 years OR  Fecal Occult Blood Testing yearly OR  Flexible  Sigmoidoscopy  every 5 yr OR  CT Colonography every 5 yrs    --07/23/2019  See below for due date if applicable.  Scheduled for October   Screening Pap Test   Recommended every 3 years for all women age 75 to 58, or every five years if combined with HPV test (routine screening not needed after total hysterectomy).  Medicare covers every 2 years or  yearly if high risk.  Screening Pelvic Exam   Medicare covers every 2 years, yearly if high risk or childbearing age with abnormal Pap in last 3 yrs.     See below for due date if applicable.   Screening Mammogram   Recommended every 2 years for women age 30 to 38, or more frequent if you have a higher risk. Selectively recommended for women between 40-49 based on shared decisions about risk. Covered by Medicare up to every year for women age 46 or older --10/06/2022  See below for due date if applicable.  Due 09/2023       Lung Cancer Screening  Annual low dose computed tomography (LDCT scan) is recommended for those age 43-80 who smoked 20 pack-years and are current smokers or quit smoking within past 15 years, after counseling by your doctor or nurse clinician about the possible benefits or harms.   --12/21/2019  See below for due date if applicable.   Vaccinations   Respiratory syncytial virus (RSV)  Age 56 years or older: Based on shared clinical decision-making with your provider.  Pneumococcal Vaccine  Recommended routinely age 60+ with one or two separate vaccines based on your risk. Recommended before age 43 if medical conditions with increased risk  Seasonal Influenza Vaccine  Once every flu season   Hepatitis B Vaccine  3 doses if risk (including anyone with diabetes or liver disease)  Shingles Vaccine  Two doses at age 18 or older  Diphtheria Tetanus Pertussis Vaccine  ONCE as adult, booster every 10 years     Immunization History   Administered Date(s) Administered    Covid-19 Vaccine,Pfizer-BioNTech,Purple Top,19yrs+ 01/11/2020, 02/01/2020, 10/29/2020    DIPTH,PERTUSSIS-ACEL,TETANUS >10 YRS OLD 06/04/2010    H1n1 Flu Vaccine Injection (Admin) 10/10/2008    INFLUENZA VIRUS VACCINE (ADMIN) 09/11/2001, 07/24/2012, 10/11/2013    Influenza Vaccine, 6 month-adult 09/09/2010, 09/07/2011, 07/24/2012, 10/11/2013, 08/19/2014, 08/28/2015, 06/19/2016, 07/14/2017, 07/20/2018, 07/12/2019, 06/10/2020, 08/07/2021     Influenza Vaccine, 65+ 07/16/2022    Pneumovax 03/29/2013    Shingrix - Zoster Vaccine 04/04/2019, 07/05/2019    Tetanus,Diptheria,Pertussis(BOOSTRIX) 06/04/2010, 03/12/2020     Shingles vaccine and Diphtheria Tetanus Pertussis vaccines are available at pharmacies or local health department without a prescription.   Other Preventative Screening  Last Completion Date   Bone Densitometry   Screening: All females ages 52 and older every 10 years if initial screening normal. Postmenopausal women ages 41-64 need screening with one or more risk factor: previous fracture, parental hip fracture, current smoker, low body weight, excessive alcohol use, Rheumatoid Arthritis   For women with diagnosed Osteoporosis, follow up is recommended every 2 years or a frequency recommended by your provider.     --10/05/2022  See below for due date if applicable.  Due 09/2024     Glaucoma Screening   Yearly if in high risk group such as diabetes, family history, African American age 71+ or Hispanic American age 62+   See your eye care provider for screening.   Hepatitis C Screening   Recommended  for those born between ages 18-79 years.   --12/12/2017  See below for due date if applicable.     HIV Testing  Recommended routinely at least ONCE, covered every year for age 69 to 84 regardless of risk, and every year for age over 18 who ask for the test or higher risk. Yearly or up to 3 times in pregnancy       --12/12/2017  See below for due date if applicable.   Abdominal Aortic Aneurysm Screening Ultrasound   Once with a family history of abdominal aortic aneurysms OR a female between 78-75 and have smoked at least 100 cigarettes in your lifetime.         See below for due date if applicable.       Your Personalized Schedule for Preventive Tests     Health Maintenance: Pending and Last Completed         Date Due Completion Date    Pneumococcal Vaccination, Age 45+ (2 of 2 - PCV) 03/29/2014 03/29/2013    Covid-19 Vaccine (4 - 2023-24 season)  06/25/2022 10/29/2020    Colonoscopy 07/22/2022 07/23/2019    Medicare Annual Wellness Visit - Calendar Year Insurers 10/25/2022 10/05/2022    Influenza Vaccine (1) 06/26/2023 07/16/2022    Breast Cancer Screening 10/07/2023 10/06/2022    Osteoporosis screening 10/05/2024 10/05/2022    Adult Tdap-Td (3 - Td or Tdap) 03/12/2030 03/12/2020                  For Information on Advanced Directives for Health Care:  [place]:  [email or web address]  [place], [place], [place], [place] General Information: [email or web address]     Will see you next year for Annual Wellness Visit!  It was nice meeting you!  Algis Liming  RN

## 2023-05-09 NOTE — Nursing Note (Signed)
05/09/23 1022   PHQ 9 (follow up)   Little interest or pleasure in doing things. 1   Feeling down, depressed, or hopeless 2   Trouble falling or staying asleep, or sleeping too much. 0   Feeling tired or having little energy 1   Poor appetite or overeating 2   Feeling bad about yourself/ that you are a failure in the past 2 weeks? 0   Trouble concentrating on things in the past 2 weeks? 2   Moving/Speaking slowly or being fidgety or restless  in the past 2 weeks? 0   Thoughts that you would be better off DEAD, or of hurting yourself in some way. 0   If you checked off any problems, how difficult have these problems made it for you to do your work, take care of things at home, or get along with other people? Somewhat difficult   PHQ 9 Total 8   Interpretation of Total Score Mild depression

## 2023-05-09 NOTE — Progress Notes (Addendum)
Department of Family Medicine   Progress Note    [name]  MRN: [id number]  DOB: March 26, 1956  Date of Service: 05/09/2023    CHIEF COMPLAINT  Chief Complaint   Patient presents with    ED Follow-up    Medicare Annual       SUBJECTIVE  [name] is a 67 y.o. female who presents to clinic for ED follow-up. Patient was recently admitted to [facility] ED on 04/17/23 for chest pain, where she was found to have a MI of the LAD. Patient underwent PCI and 3 stents were placed. She was discharged 6/24 and started on Aspirin 81 mg, BRILINTA 90 mg, and Lisinopril 5mg . She denies fatigue, pain, N/V, and excessive bleeding/bruising. She affirms that she is able to complete normal ADLs. She complains of dry cough which began shortly after her ED admission. Aside from that, she has no other concerns at this time.     Neuropathy is stable with gabapentin 800 mg BID. She denies adverse side effects.     Mood has been stable on Pristiq 100 mg. She is following with behavioral medicine.         Review of Systems:  Positive ROS discussed in HPI, otherwise all other systems negative.      Medications:   albuterol sulfate (PROAIR HFA) 90 mcg/actuation Inhalation oral inhaler, Take 2 Puffs by inhalation Every 4 hours as needed  aspirin 81 mg Oral Tablet, Chewable, Chew 1 Tablet (81 mg total) Once a day  baclofen (LIORESAL) 5 mg Oral Tablet, Take 1 Tablet (5 mg total) by mouth Three times a day as needed  BRILINTA 90 mg Oral Tablet, Take 1 Tablet (90 mg total) by mouth Twice daily  clobetasoL (TEMOVATE) 0.05 % Ointment, Apply topically Twice daily  desvenlafaxine (PRISTIQ) 100 mg Oral Tablet Sustained Release 24 hr, Take 1 Tablet (100 mg total) by mouth Once a day  diclofenac sodium (VOLTAREN) 1 % Gel, Apply topically Four times a day - before meals and bedtime  fluticasone propionate (FLONASE) 50 mcg/actuation Nasal Spray, Suspension, ADMINISTER 2 SPRAYS INTO EACH NOSTRIL ONCE A DAY. (Patient taking differently:  Administer 2 Sprays into each nostril Once a day Takes in am)  gabapentin (NEURONTIN) 800 mg Oral Tablet, Take 1 Tablet (800 mg total) by mouth Twice daily  lisinopriL (PRINIVIL) 5 mg Oral Tablet, Take 1 Tablet (5 mg total) by mouth Once a day  nitroGLYCERIN (NITROSTAT) 0.4 mg Sublingual Tablet, Sublingual, DISSOLVE 1 TABLET UNDER THE TONGUE AS NEEDED FOR CHEST PAIN EVERY 5 MINUTES UP TO 3 TIMES. IF NO RELIEF CALL 911.  ondansetron (ZOFRAN) 4 mg Oral Tablet, Take 1 Tablet (4 mg total) by mouth Every 8 hours as needed for Nausea/Vomiting  pantoprazole (PROTONIX) 40 mg Oral Tablet, Delayed Release (E.C.), Take 1 Tablet (40 mg total) by mouth Once a day Takes in am  rosuvastatin (CRESTOR) 20 mg Oral Tablet, Take 1 Tablet (20 mg total) by mouth Every evening  tiotropium bromide (SPIRIVA HANDIHALER) 18 mcg Inhalation Capsule, w/Inhalation Device, Take 1 Capsule (18 mcg total) by inhalation Once a day  umeclidinium (INCRUSE ELLIPTA) 62.5 mcg/actuation Inhalation oral inhaler, Take 1 Inhalation by inhalation Once a day    No facility-administered medications prior to visit.      Allergies:   Allergies   Allergen Reactions    Cymbalta [Duloxetine]      Rash on feet and hands    Blue Dye      Told to avoid due  to patch test result by [facility]    Flavoring Agent  Other Adverse Reaction (Add comment)     Patient unsure of reaction    Lipitor [Atorvastatin] Myalgia     Muscle pain    Nickel      Blisters  "Allergic to all kinds of metal"     Codeine Itching     RASH    Mobic [Meloxicam] Nausea/ Vomiting     Nervous and shakes     Naprosyn [Naproxen] Nausea/ Vomiting    Nsaids (Non-Steroidal Anti-Inflammatory Drug) Nausea/ Vomiting         OBJECTIVE  BP 134/78   Pulse 91   Temp 36.3 C (97.3 F) (Thermal Scan)   Ht 1.626 m (5' 4.02")   Wt 97.8 kg (215 lb 9.8 oz)   SpO2 98%   BMI 36.99 kg/m       General: no distress  HENT: normocephalic, atraumatic, EOMs intact, nares patent, trachea midline, neck supple  Lungs: clear to  auscultation bilaterally  Cardiovascular: RRR, no murmur  Extremities: no cyanosis or edema  Skin: warm and dry, no rash  Neurologic: gait is normal, AOx3  Psychiatric: normal affect and behavior      ASSESSMENT/PLAN  1. Medicare annual wellness visit, subsequent  -see attached documentation     2. Hyperlipidemia, unspecified hyperlipidemia type  -chronic   -order lipid panel to assess     3. Essential hypertension  -chronic, well-controlled  -discontinue Lisinopril due to adverse side effects (cough)   -start Losartan (COZAAR) 25 mg Oral Tablet    4. Coronary artery disease, unspecified vessel or lesion type, unspecified whether angina   5. Myocardial infarction, unspecified MI type, unspecified artery (CMS HCC)  6. History of coronary angioplasty with insertion of stent  - Refer to [facility]  -Order BMP to assess renal function and r/o renovascular disease     7. Neuropathy (CMS HCC)  -chronic, pain controlled with gabapentin   -continue current regimen     8. Severe episode of recurrent major depressive disorder, 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)  -chronic   -continue current antidepressant, Desvenlafaxine     9. Severe obesity (BMI 35.0-35.9 with comorbidity) (CMS HCC)  10. Screening for diabetes mellitus  -order HGA1C to assess    11. Cough, unspecified type  Acute concern  -unclear etiology   -medication side effect vs bronchitis vs reactive airway disease vs GERD  -will monitor response of switching lisinopril to losartan           Malachi Paradise, STUDENT PHYSICIAN ASSISTANT 05/09/2023, 10:31     I have seen and examined the patient in concert with the PA student as noted above.  The above note has been modified as necessary by me to reflect my own, personal collection/validation of the patient's HPI, patient history, physical examination, and assessment and plan.     [place], PA-C

## 2023-05-11 ENCOUNTER — Telehealth (INDEPENDENT_AMBULATORY_CARE_PROVIDER_SITE_OTHER): Payer: Self-pay | Admitting: Specialist

## 2023-05-11 NOTE — Telephone Encounter (Signed)
CM rescheduled pt because of pt having recently receiving 3 stents.    05/11/2023  [name], CASE MANAGER

## 2023-05-18 ENCOUNTER — Ambulatory Visit (INDEPENDENT_AMBULATORY_CARE_PROVIDER_SITE_OTHER): Payer: Self-pay | Admitting: Rehabilitative and Restorative Service Providers"

## 2023-05-18 ENCOUNTER — Other Ambulatory Visit: Payer: Self-pay

## 2023-05-18 ENCOUNTER — Ambulatory Visit (INDEPENDENT_AMBULATORY_CARE_PROVIDER_SITE_OTHER): Payer: Self-pay | Admitting: Specialist

## 2023-05-18 DIAGNOSIS — M791 Myalgia, unspecified site: Secondary | ICD-10-CM

## 2023-05-18 DIAGNOSIS — M9901 Segmental and somatic dysfunction of cervical region: Secondary | ICD-10-CM

## 2023-05-18 DIAGNOSIS — M5090 Cervical disc disorder, unspecified, unspecified cervical region: Secondary | ICD-10-CM

## 2023-05-18 DIAGNOSIS — M542 Cervicalgia: Secondary | ICD-10-CM

## 2023-05-18 NOTE — Progress Notes (Signed)
[facility]  [address]  [place], [place] [address]  [phone]    Name:  [name]  MRN:  [id number]  Date:  05/18/2023  Birthday: [birth year]  Age:  67 y.o.  Gender  female    Subjective:  Chief Complaint   Patient presents with    Neck Pain     Pain Level:  "Tight"    Objective:  Palpation:  Hypertonicity in bilateral trapezius.    Today's Treatment:  Applied Therapeutic Massage, Trigger Point/Neuromuscular Therapy and Myofascial Release to Affected Areas.    Assessment:  Patient tolerated treatment.    Plan:  Return as needed.    Duration:  20 Min.    Primus Bravo, MT

## 2023-05-20 ENCOUNTER — Other Ambulatory Visit (HOSPITAL_BASED_OUTPATIENT_CLINIC_OR_DEPARTMENT_OTHER): Payer: Medicare (Managed Care)

## 2023-05-20 ENCOUNTER — Ambulatory Visit: Payer: Medicare (Managed Care) | Attending: Adult Health | Admitting: Adult Health

## 2023-05-20 ENCOUNTER — Encounter (HOSPITAL_BASED_OUTPATIENT_CLINIC_OR_DEPARTMENT_OTHER): Payer: Self-pay | Admitting: Adult Health

## 2023-05-20 ENCOUNTER — Other Ambulatory Visit: Payer: Self-pay

## 2023-05-20 ENCOUNTER — Inpatient Hospital Stay (HOSPITAL_BASED_OUTPATIENT_CLINIC_OR_DEPARTMENT_OTHER)
Admission: RE | Admit: 2023-05-20 | Discharge: 2023-05-20 | Disposition: A | Discharge status: Home / Self Care | Payer: Medicare (Managed Care) | Source: Ambulatory Visit

## 2023-05-20 VITALS — BP 110/72 | HR 112 | Temp 96.9°F | Ht 65.55 in | Wt 215.8 lb

## 2023-05-20 DIAGNOSIS — Z131 Encounter for screening for diabetes mellitus: Secondary | ICD-10-CM

## 2023-05-20 DIAGNOSIS — R059 Cough, unspecified: Secondary | ICD-10-CM

## 2023-05-20 DIAGNOSIS — I2510 Atherosclerotic heart disease of native coronary artery without angina pectoris: Secondary | ICD-10-CM | POA: Insufficient documentation

## 2023-05-20 DIAGNOSIS — Z6835 Body mass index (BMI) 35.0-35.9, adult: Secondary | ICD-10-CM | POA: Insufficient documentation

## 2023-05-20 DIAGNOSIS — E785 Hyperlipidemia, unspecified: Secondary | ICD-10-CM

## 2023-05-20 DIAGNOSIS — R079 Chest pain, unspecified: Secondary | ICD-10-CM | POA: Insufficient documentation

## 2023-05-20 DIAGNOSIS — J449 Chronic obstructive pulmonary disease, unspecified: Secondary | ICD-10-CM | POA: Insufficient documentation

## 2023-05-20 LAB — LIPID PANEL
CHOL/HDL RATIO: 3.7
CHOLESTEROL: 168 mg/dL (ref 100–200)
HDL CHOL: 45 mg/dL — ABNORMAL LOW (ref >=50)
LDL CALC: 99 mg/dL (ref <100)
NON-HDL: 123 mg/dL (ref <=190)
TRIGLYCERIDES: 134 mg/dL (ref <150)
VLDL CALC: 22 mg/dL (ref <30)

## 2023-05-20 LAB — HGA1C (HEMOGLOBIN A1C WITH EST AVG GLUCOSE)
ESTIMATED AVERAGE GLUCOSE: 114 mg/dL
HEMOGLOBIN A1C: 5.6 % (ref 4.0–5.6)

## 2023-05-20 LAB — CBC WITH DIFF
BASOPHIL #: <0.1 10*3/uL (ref <=0.20)
BASOPHIL %: 0.2 %
EOSINOPHIL #: <0.1 10*3/uL (ref <=0.50)
EOSINOPHIL %: 0 %
HCT: 46.6 % — ABNORMAL HIGH (ref 34.8–46.0)
HGB: 15.1 g/dL (ref 11.5–16.0)
IMMATURE GRANULOCYTE #: <0.1 10*3/uL (ref <0.10)
IMMATURE GRANULOCYTE %: 0.3 % (ref 0.0–1.0)
LYMPHOCYTE #: 2.66 10*3/uL (ref 1.00–4.80)
LYMPHOCYTE %: 22.6 %
MCH: 28.4 pg (ref 26.0–32.0)
MCHC: 32.4 g/dL (ref 31.0–35.5)
MCV: 87.8 fL (ref 78.0–100.0)
MONOCYTE #: 0.88 10*3/uL (ref 0.20–1.10)
MONOCYTE %: 7.5 %
MPV: 9.8 fL (ref 8.7–12.5)
NEUTROPHIL #: 8.17 10*3/uL — ABNORMAL HIGH (ref 1.50–7.70)
NEUTROPHIL %: 69.4 %
PLATELETS: 545 10*3/uL — ABNORMAL HIGH (ref 150–400)
RBC: 5.31 10*6/uL — ABNORMAL HIGH (ref 3.85–5.22)
RDW-CV: 14.5 % (ref 11.5–15.5)
WBC: 11.8 10*3/uL — ABNORMAL HIGH (ref 3.7–11.0)

## 2023-05-20 LAB — BASIC METABOLIC PANEL
ANION GAP: 10 mmol/L (ref 4–13)
BUN/CREA RATIO: 15 (ref 6–22)
BUN: 20 mg/dL (ref 8–25)
CALCIUM: 10.3 mg/dL (ref 8.6–10.3)
CHLORIDE: 107 mmol/L (ref 96–111)
CO2 TOTAL: 22 mmol/L — ABNORMAL LOW (ref 23–31)
CREATININE: 1.33 mg/dL — ABNORMAL HIGH (ref 0.60–1.05)
ESTIMATED GFR - FEMALE: 44 mL/min/BSA — ABNORMAL LOW (ref >=60)
GLUCOSE: 95 mg/dL (ref 65–125)
POTASSIUM: 4.1 mmol/L (ref 3.5–5.1)
SODIUM: 139 mmol/L (ref 136–145)

## 2023-05-20 MED ORDER — TIOTROPIUM BROMIDE 2.5 MCG/ACTUATION MIST FOR INHALATION
2.0000 | Freq: Every day | RESPIRATORY_TRACT | 5 refills | Status: DC
Start: 2023-05-20 — End: 2024-06-26

## 2023-05-20 MED ORDER — INCRUSE ELLIPTA 62.5 MCG/ACTUATION POWDER FOR INHALATION
1.0000 | DISK | Freq: Every day | RESPIRATORY_TRACT | 2 refills | Status: DC
Start: 2023-05-20 — End: 2023-12-02

## 2023-05-20 NOTE — Progress Notes (Signed)
Department of Family Medicine   Progress Note    [name]  MRN: [id number]  DOB: [birth year]  Date of Service: 05/26/2023    CHIEF COMPLAINT  Chief Complaint   Patient presents with    Cough     X 1 month      Shortness of Breath       SUBJECTIVE  [name] is a 67 y.o. female who presents to clinic for complaints of a cough which she had a heart attack and had 3 stents put in in 04/17/2023 at [facility].    She had been put on lisinopril and then was taken off lisinopril at her last clinic visit with Ovidio Hanger 05/09/2023.  She was started on losartan 25 mg daily - her BP today was 110/72.  She has COPD and is prescribed Spiriva, Ellipta and Albuterol. She hasn't been taking her Spiriva however because she states that it doesn't work.  She states that the capsule doesn't seem to get pierced and she doesn't get the medication.  She states that has had taken all kinds of OTC cough medicines with no relief.     Review of Systems:  Positive ROS discussed in HPI, otherwise all other systems negative.      Medications:   albuterol sulfate (PROAIR HFA) 90 mcg/actuation Inhalation oral inhaler, Take 2 Puffs by inhalation Every 4 hours as needed  aspirin 81 mg Oral Tablet, Chewable, Chew 1 Tablet (81 mg total) Once a day  baclofen (LIORESAL) 5 mg Oral Tablet, Take 1 Tablet (5 mg total) by mouth Three times a day as needed  BRILINTA 90 mg Oral Tablet, Take 1 Tablet (90 mg total) by mouth Twice daily  clobetasoL (TEMOVATE) 0.05 % Ointment, Apply topically Twice daily  desvenlafaxine (PRISTIQ) 100 mg Oral Tablet Sustained Release 24 hr, Take 1 Tablet (100 mg total) by mouth Once a day  diclofenac sodium (VOLTAREN) 1 % Gel, Apply topically Four times a day - before meals and bedtime (Patient not taking: Reported on 05/20/2023)  fluticasone propionate (FLONASE) 50 mcg/actuation Nasal Spray, Suspension, ADMINISTER 2 SPRAYS INTO EACH NOSTRIL ONCE A DAY. (Patient taking differently: Administer 2  Sprays into each nostril Once a day Takes in am)  gabapentin (NEURONTIN) 800 mg Oral Tablet, Take 1 Tablet (800 mg total) by mouth Twice daily  losartan (COZAAR) 25 mg Oral Tablet, Take 1 Tablet (25 mg total) by mouth Once a day  nitroGLYCERIN (NITROSTAT) 0.4 mg Sublingual Tablet, Sublingual, DISSOLVE 1 TABLET UNDER THE TONGUE AS NEEDED FOR CHEST PAIN EVERY 5 MINUTES UP TO 3 TIMES. IF NO RELIEF CALL 911.  ondansetron (ZOFRAN) 4 mg Oral Tablet, Take 1 Tablet (4 mg total) by mouth Every 8 hours as needed for Nausea/Vomiting  pantoprazole (PROTONIX) 40 mg Oral Tablet, Delayed Release (E.C.), Take 1 Tablet (40 mg total) by mouth Once a day Takes in am  rosuvastatin (CRESTOR) 20 mg Oral Tablet, Take 1 Tablet (20 mg total) by mouth Every evening  tiotropium bromide (SPIRIVA HANDIHALER) 18 mcg Inhalation Capsule, w/Inhalation Device, Take 1 Capsule (18 mcg total) by inhalation Once a day  umeclidinium (INCRUSE ELLIPTA) 62.5 mcg/actuation Inhalation oral inhaler, Take 1 Inhalation by inhalation Once a day    No facility-administered medications prior to visit.      Allergies:   Allergies   Allergen Reactions    Cymbalta [Duloxetine]      Rash on feet and hands    Blue Dye  Told to avoid due to patch test result by [facility]    Flavoring Agent  Other Adverse Reaction (Add comment)     Patient unsure of reaction    Lipitor [Atorvastatin] Myalgia     Muscle pain    Nickel      Blisters  "Allergic to all kinds of metal"     Codeine Itching     RASH    Mobic [Meloxicam] Nausea/ Vomiting     Nervous and shakes     Naprosyn [Naproxen] Nausea/ Vomiting    Nsaids (Non-Steroidal Anti-Inflammatory Drug) Nausea/ Vomiting         OBJECTIVE  BP 110/72   Pulse (!) 112   Temp 36.1 C (96.9 F) (Thermal Scan)   Ht 1.665 m (5' 5.55")   Wt 97.9 kg (215 lb 13.3 oz)   SpO2 97%   BMI 35.31 kg/m       General: no distress  HENT: TMs clear, mouth mucous membranes moist, pharynx without injection or exudate   Lungs: clear to auscultation  bilaterally  Cardiovascular: RRR, no murmur  Abdomen: soft, non tender, bowel sounds present  Extremities: no cyanosis or edema  Skin: warm and dry, no rash  Neurologic: gait is normal, AOx3, CN 2-12 grossly intact  Psychiatric: normal affect and behavior    ASSESSMENT/PLAN  (R05.9) Cough, unspecified type  (primary encounter diagnosis)  Plan:Cough for the past month   Unknown etiology  Lisinopril was DC'd and losartan started on 05/09/2023  XR CHEST PA AND LATERAL, CBC/DIFF    (R07.9) Chest pain at rest  Plan: ECG 12 Lead (TODAY, IN CLINIC) - by clinical         staff in department   Normal ECG   Continue Cozaar, Brilinta and aspirin daily as prescribed    (J44.9) Chronic obstructive pulmonary disease, unspecified COPD type (CMS HCC)  Plan: Continue Ellipta 62.5 mcg/actuation once daily   Spiriva HandiHaler DC'd   Start Spiriva Respimat, daily  Continue albuterol sulfate (PROAIR HFA) 90 mcg/actuation Inhalation oral inhaler, 2 Puffs every 4 hours prn       Orders Placed This Encounter    XR CHEST PA AND LATERAL    CBC/DIFF    ECG 12 Lead (TODAY, IN CLINIC) - by clinical staff in department    umeclidinium (INCRUSE ELLIPTA) 62.5 mcg/actuation Inhalation oral inhaler    tiotropium bromide (SPIRIVA RESPIMAT) 2.5 mcg/actuation Inhalation oral inhaler         Return if symptoms worsen or fail to improve, for f/u PCP.    On the day of the encounter, a total of 20  minutes was spent on this patient encounter including review of historical information, examination, documentation and post-visit activities.     [place] Isaack Preble, APRN,FNP-BC 05/26/2023, 12:09

## 2023-05-22 DIAGNOSIS — R079 Chest pain, unspecified: Secondary | ICD-10-CM

## 2023-05-22 LAB — ECG 12 LEAD - (AMB USE ONLY)(MUSE, IN CLINIC)
Atrial Rate: 100 {beats}/min
Calculated P Axis: 45 degrees
Calculated R Axis: -15 degrees
Calculated T Axis: -10 degrees
PR Interval: 138 ms
QRS Duration: 84 ms
QT Interval: 358 ms
QTC Calculation: 461 ms
Ventricular rate: 100 {beats}/min

## 2023-05-23 ENCOUNTER — Encounter (INDEPENDENT_AMBULATORY_CARE_PROVIDER_SITE_OTHER): Payer: Self-pay | Admitting: Chiropractor

## 2023-05-26 ENCOUNTER — Encounter (HOSPITAL_BASED_OUTPATIENT_CLINIC_OR_DEPARTMENT_OTHER): Payer: Self-pay | Admitting: Physician Assistant

## 2023-05-26 ENCOUNTER — Encounter (HOSPITAL_BASED_OUTPATIENT_CLINIC_OR_DEPARTMENT_OTHER): Payer: Self-pay | Admitting: Adult Health

## 2023-05-26 ENCOUNTER — Other Ambulatory Visit (HOSPITAL_BASED_OUTPATIENT_CLINIC_OR_DEPARTMENT_OTHER): Payer: Self-pay | Admitting: Adult Health

## 2023-05-26 DIAGNOSIS — R059 Cough, unspecified: Secondary | ICD-10-CM

## 2023-05-26 MED ORDER — AMOXICILLIN 875 MG-POTASSIUM CLAVULANATE 125 MG TABLET
1.0000 | ORAL_TABLET | Freq: Two times a day (BID) | ORAL | 0 refills | Status: AC
Start: 2023-05-26 — End: 2023-06-02

## 2023-05-26 NOTE — Progress Notes (Signed)
FAMILY MEDICINE, [facility]  [address]  [place] [address]  Operated by [facility]  Medicare Annual Wellness Visit    Name: [name] MRN:  [id number]   Date: 05/09/2023 Age: 67 y.o.       SUBJECTIVE:   [name] is a 67 y.o. female for presenting for Medicare Wellness exam.   I have reviewed and reconciled the medication list with the patient today.        05/09/2023    10:34 AM 10/05/2022     2:24 PM   Comprehensive Health Assessment-Adult   Do you wish to complete this form? Yes Yes   During the past 4 weeks, how would you rate your health in general? Good Fair       chronic back pain   During the past 4 weeks, how much difficulty have you had doing your usual activities inside and outside your home because of medical or emotional problems? A little bit of difficulty Much difficulty       frequent breaks because of back pain.   During the past 4 weeks, was someone available to help you if you needed and wanted help? Yes, as much as I wanted Yes, as much as I wanted   In the past year, how many times have you gone to the emergency department or been admitted to a hospital for a health problem? 1 time 1 time   Are you generally satisfied with your sleep? Yes No       trouble falling asleep and staying asleep due the back pain   Do you have enough money to buy things you need in everyday life, such as food, clothing, medicines, and housing? Yes, always Sometimes       in the process of moving so utilities cost should go down; no issue getting medications.  Has been on SSI disability and has been contacting Medicare/Social Security to fight for your Medicare A   Can you get to places beyond walking distance without help?  (For example, can you drive your own car or travel alone on buses)? Yes Yes   Do you fasten your seatbelt when you are in a car? Yes, usually Yes, usually   Do you exercise 20 minutes 3 or more days per week (such as walking, dancing,  biking, mowing grass, swimming)? Yes, most of the time No, I usually don't exercise this much   How often do you eat food that is healthy (fruits, vegetables, lean meats) instead of unhealthy (sweets, fast food, junk food, fatty foods)? Almost always Almost always   Have your parents, brothers or sisters had any of the following problems before the age of 37? (check all that apply) Cancer;Diabetes (sugar);Heart problems, or hardening of the arteries Heart problems, or hardening of the arteries;Diabetes (sugar);High cholesterol;Cancer;Other family illness;Mental health problems such as depression, bipolar, severe anxiety, postpartum depression   How often do you have trouble taking medicines the eay you are told to take them? I always take them as prescribed I always take them as prescribed   Do you need any help communicating with your doctors and nurses because of vision or hearing problems? No No   During the past 12 months, have you experienced confusion or memory loss that is happening more often or is getting worse? No Yes       confusion; difficulty recognizing certain places; just a couple day   Do you have one person you  think of as your personal doctor (primary care provider or family doctor)? Yes Yes   If you are seeing a Primary Care Provider (PCP) or family doctor. please list their name Ovidio Hanger Ovidio Hanger, PA-C   Are you now also seeing any specialist physician(s) (such as eye doctor, foot doctor, skin doctor)? Yes Yes   If you are seeing a specialist for anything such as foot, eye, skin, etc.  please list their name(s) see list see list   How confident are you that you can control or manage most of your health problems? Very confident Very confident       I have reviewed and updated as appropriate the past medical, family and social history. 05/09/2023 as summarized below:  Past Medical History:   Diagnosis Date    Anxiety     Arthritis     Awareness under anesthesia     woke during colonoscopy     Cancer (CMS HCC)     internal skin cancer    Chest pain 02/19/2016    Chronic pain     lower back    Depression     Dyspnea on exertion     GERD (gastroesophageal reflux disease)     controlled w/med    H/O hearing loss     Headache     Hearing loss     Heart murmur 1979    benign    History of coronary angioplasty with insertion of stent 05/09/2023    Hyperlipidemia LDL goal < 100 08/29/2013    denies    Migraine     MINOR CAD (coronary artery disease) 11/10/2012    no treatment other than cholesterol medications; follows regularly with K.Dalton, PA    Multinodular goiter     Muscle weakness     neck     Myocardial infarction (CMS HCC) 05/09/2023    Neck problem     spinal stenosis    Obesity     Peripheral edema     Rash     face only eczema    Rheumatic fever     in 1974 no complications    S/P thyroidectomy     Sleep apnea     Squamous cell carcinoma 02/06/2015    right side of neck    Thyroid disorder     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    Tinnitus     Vaginal prolapse 2012    surgery improved significantly    Wears glasses      Past Surgical History:   Procedure Laterality Date    Cataract extraction Right 12/07/2022    Chalazion excision Left 12/22/2021    Colonoscopy  01/06/2009    Gastroscopy  03/05/2010    Gastroscopy with biopsy  03/05/2010    Hx adenoidectomy      Hx ankle fracture tx  2007    Hx appendectomy      Hx cholecystectomy      Hx colonoscopy      Hx cystocele repair  09/24/2009    Hx hand surgery  2012    Hx heart catheterization      Hx hysterectomy      Hx oophorectomy      Hx parotidectomy       Hx partial thyroidectomy Right 04/05/2019    Hx tonsillectomy      Hx total vaginal hysterectomy  1979    Hx wisdom teeth extraction      Hx wrist fracture tx Left  2007    Parathyroid gland surgery      Pars plana vitrectomy Right 11/02/2021    Pb revise ulnar nerve at elbow Left 1979    Pb upper gi endoscopy,biopsy  12/19/2007    Septoplasty       Current Outpatient Medications    Medication Sig    albuterol sulfate (PROAIR HFA) 90 mcg/actuation Inhalation oral inhaler Take 2 Puffs by inhalation Every 4 hours as needed    aspirin 81 mg Oral Tablet, Chewable Chew 1 Tablet (81 mg total) Once a day    baclofen (LIORESAL) 5 mg Oral Tablet Take 1 Tablet (5 mg total) by mouth Three times a day as needed    BRILINTA 90 mg Oral Tablet Take 1 Tablet (90 mg total) by mouth Twice daily    clobetasoL (TEMOVATE) 0.05 % Ointment Apply topically Twice daily    desvenlafaxine (PRISTIQ) 100 mg Oral Tablet Sustained Release 24 hr Take 1 Tablet (100 mg total) by mouth Once a day    diclofenac sodium (VOLTAREN) 1 % Gel Apply topically Four times a day - before meals and bedtime (Patient not taking: Reported on 05/20/2023)    fluticasone propionate (FLONASE) 50 mcg/actuation Nasal Spray, Suspension ADMINISTER 2 SPRAYS INTO EACH NOSTRIL ONCE A DAY. (Patient taking differently: Administer 2 Sprays into each nostril Once a day Takes in am)    gabapentin (NEURONTIN) 800 mg Oral Tablet Take 1 Tablet (800 mg total) by mouth Twice daily    losartan (COZAAR) 25 mg Oral Tablet Take 1 Tablet (25 mg total) by mouth Once a day    nitroGLYCERIN (NITROSTAT) 0.4 mg Sublingual Tablet, Sublingual DISSOLVE 1 TABLET UNDER THE TONGUE AS NEEDED FOR CHEST PAIN EVERY 5 MINUTES UP TO 3 TIMES. IF NO RELIEF CALL 911.    ondansetron (ZOFRAN) 4 mg Oral Tablet Take 1 Tablet (4 mg total) by mouth Every 8 hours as needed for Nausea/Vomiting    pantoprazole (PROTONIX) 40 mg Oral Tablet, Delayed Release (E.C.) Take 1 Tablet (40 mg total) by mouth Once a day Takes in am    rosuvastatin (CRESTOR) 20 mg Oral Tablet Take 1 Tablet (20 mg total) by mouth Every evening    tiotropium bromide (SPIRIVA RESPIMAT) 2.5 mcg/actuation Inhalation oral inhaler Take 2 Inhalations (2 Puffs total) by inhalation Once a day    umeclidinium (INCRUSE ELLIPTA) 62.5 mcg/actuation Inhalation oral inhaler Take 1 Inhalation by inhalation Once a day     Family Medical  History:       Problem Relation (Age of Onset)    Bipolar Disorder Daughter, Paternal Uncle    Breast Cancer Paternal Aunt, Paternal Aunt, Paternal Aunt, Paternal Aunt, Paternal Aunt, Other    Cancer Paternal Aunt, Paternal Aunt, Paternal Aunt, Paternal Aunt, Paternal Aunt, Other (38)    Congestive Heart Failure Father (50)    Coronary Artery Disease Father, Mother (39)    Diabetes Mother    Heart Attack Father, Sister    High Cholesterol Mother    Hypertension (High Blood Pressure) Mother, Brother, Sister, Sister    Kidney Disease Sister    Leukemia Paternal Uncle    Stroke Paternal Grandmother    Thyroid Disease Sister            Social History     Socioeconomic History    Marital status: Divorced    Number of children: 3   Occupational History    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      Social Determinants of Health     Health Literacy: Unknown (07/27/2022)    Health Literacy     SDOH Health Literacy: Patient chooses not to answer.         List of Current Health Care Providers   Care Team       PCP       Name Type Specialty Phone Number    Ovidio Hanger, [place] Physician Assistant PHYSICIAN ASSISTANT [phone]              Care Team       Name Type Specialty Phone Number    Paulo Fruit, MD Physician FAMILY PRACTICE [phone]    Lincoln Brigham, MD Physician OPHTHALMOLOGY [phone]    Cyril Loosen, MD Physician UROLOGY [phone]     Jannifer Franklin, MD Physician NEUROLOGICAL SURGERY [phone]    Leodis Liverpool, MD Physician PSYCHIATRY [phone]                      Health Maintenance   Topic Date Due    Pneumococcal Vaccination, Age 56+ (2 of 2 - PCV) 03/29/2014    Covid-19 Vaccine (4 - 2023-24 season) 06/25/2022    Colonoscopy  07/22/2022    Influenza Vaccine (1) 06/26/2023    Breast Cancer Screening  10/07/2023    Osteoporosis screening  10/05/2024    Adult Tdap-Td (3 - Td or Tdap) 03/12/2030    Hepatitis C screening  Completed    HIV Screening  Completed    Shingles Vaccine  Completed    Medicare Annual Wellness Visit - Calendar Year Insurers  Completed    Meningococcal Vaccine  Aged Out    CT Lung Cancer Screening  Discontinued     Medicare Wellness Assessment   Medicare initial or wellness physical in the last year?: No  Advance Directives   Does patient have a living will or MPOA: No           Advance directive information given to the patient today?: Yes      Activities of Daily Living   Do you need help with dressing, bathing, or walking?: No   Do you need help with shopping, housekeeping, medications, or finances?: No   Do you have rugs in hallways, broken steps, or poor lighting?: No   Do you have grab bars in your bathroom, non-slip strips in your tub, and hand rails on your stairs?: Yes   Cognitive Function Screen (1=Yes, 0=No)   What is you age?: Correct   What is the time to the nearest hour?: Correct   What is the year?: Correct   What is the name of this clinic?: Correct   Can the patient recognize two persons (the doctor, the nurse, home help,  etc.)?: Correct   What is the date of your birth? (day and month sufficient) : Correct   In what year did World War II end?: Correct   Who is the current president of the [place]?: Correct   Count from 20 down to 1?: Correct   What address did I give you earlier?: Correct   Total Score: 10   Interpretation of Total Score: Greater than 6 Normal   Fall Risk Screen   Do you  feel unsteady when standing or walking?: No  Do you worry about falling?: No  Have you fallen in the past year?: Yes  How many times have you fallen?: Once  Were you ever injured from falling?: No  Timed up and go test (in seconds): 6   Depression Screen     Little interest or pleasure in doing things.: Several Days  Feeling down, depressed, or hopeless: More than half the days  PHQ 2 Total: 3  Trouble falling or staying asleep, or sleeping too much.: Not at all  Feeling tired or having little energy: Several Days  Poor appetite or overeating: More than half the days  Feeling bad about yourself/ that you are a failure in the past 2 weeks?: Not at all  Trouble concentrating on things in the past 2 weeks?: More than half the days  Moving/Speaking slowly or being fidgety or restless  in the past 2 weeks?: Not at all  Thoughts that you would be better off DEAD, or of hurting yourself in some way.: Not at all  PHQ 9 Total: 8  Interpretation of Total Score: 5-9 Mild depression     Pain Score   Pain Score:   0 - No pain    Substance Use-Abuse Screening     Tobacco Use     In Past 12 MONTHS, how often have you used any tobacco product (for example, cigarettes, e-cigarettes, cigars, pipes, or smokeless tobacco)?: Never     Alcohol use     In the PAST 12 MONTHS, how often have you had 5 (men)/4 (women) or more drinks containing alcohol in one day?: Never     Prescription Drug Use     In the PAST 12 months, how often have you used any prescription medications just for the feeling, more than prescribed, or that were not prescribed for you? Prescriptions may include: opioids, benzodiazepines, medications for ADHD: Never           Illicit Drug Use   In the PAST 12 MONTHS, how often have you used any drugs, including marijuana, cocaine or crack, heroin, methamphetamine, hallucinogens, ecstasy/MDMA?: Never        Hearing Screen   Have you noticed any hearing difficulties?: No  After whispering 9-1-6 how many numbers did the patient  repeat correctly?: 3       Vision Screen                                 OBJECTIVE:   BP 134/78   Pulse 91   Temp 36.3 C (97.3 F) (Thermal Scan)   Ht 1.626 m (5' 4.02")   Wt 97.8 kg (215 lb 9.8 oz)   SpO2 98%   BMI 36.99 kg/m        Other appropriate exam:    Health Maintenance Due   Topic Date Due    Pneumococcal Vaccination, Age 74+ (2 of 2 - PCV) 03/29/2014  Covid-19 Vaccine (4 - 2023-24 season) 06/25/2022    Colonoscopy  07/22/2022      ASSESSMENT & PLAN:   Assessment/Plan   1. Medicare annual wellness visit, subsequent    2. Hyperlipidemia, unspecified hyperlipidemia type    3. Essential hypertension    4. Coronary artery disease, unspecified vessel or lesion type, unspecified whether angina present, unspecified whether native or transplanted heart    5. Myocardial infarction, unspecified MI type, unspecified artery (CMS HCC)    6. History of coronary angioplasty with insertion of stent    7. Neuropathy (CMS HCC)    8. 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 Severe obesity (BMI 35.0-35.9 with comorbidity) (CMS HCC)    10. Screening for diabetes mellitus    11. Cough, unspecified type       Identified Risk Factors/ Recommended Actions     Fall Risk Follow up plan of care: Discussed optimizing home safety    Urinary Incontinence Plan of Care: Lifestyle modifications        Orders Placed This Encounter    LIPID PANEL    HGA1C (HEMOGLOBIN A1C WITH EST AVG GLUCOSE)    BASIC METABOLIC PANEL    Refer to [facility]    losartan (COZAAR) 25 mg Oral Tablet          The patient has been educated about risk factors and recommended preventive care. Written Prevention Plan completed/ updated and given to patient (see After Visit Summary).    Return in about 6 weeks (around 06/20/2023) for Annual physical (40 min appt).     Dalton, PA-C

## 2023-05-26 NOTE — Telephone Encounter (Signed)
Ovidio Hanger, PA-C  05/26/2023  5:07 AM EDT Back to Top      Stable labs. Will review with patient at follow up.       Patient saw the results note after sending call in.     Shona Simpson, RN  05/26/2023  10:34

## 2023-05-26 NOTE — Telephone Encounter (Signed)
Regarding: Dalton Pt  \\  results  ----- Message from [place] [facility] sent at 05/25/2023 12:53 PM EDT -----  Ovidio Hanger, PA-C    Pt requesting to obtain/review results of recent labs they state were ordered via family medicine.  Please advise.    Thanks so much for all your time and assistance!  Have a GREAT day!!!  --Marchelle Folks

## 2023-05-27 ENCOUNTER — Telehealth (HOSPITAL_BASED_OUTPATIENT_CLINIC_OR_DEPARTMENT_OTHER): Payer: Self-pay | Admitting: Physician Assistant

## 2023-06-01 ENCOUNTER — Encounter (HOSPITAL_BASED_OUTPATIENT_CLINIC_OR_DEPARTMENT_OTHER): Payer: Self-pay | Admitting: FAMILY MEDICINE

## 2023-06-01 ENCOUNTER — Ambulatory Visit
Payer: Medicare (Managed Care) | Attending: Student in an Organized Health Care Education/Training Program | Admitting: FAMILY MEDICINE

## 2023-06-01 ENCOUNTER — Other Ambulatory Visit: Payer: Self-pay

## 2023-06-01 ENCOUNTER — Telehealth (INDEPENDENT_AMBULATORY_CARE_PROVIDER_SITE_OTHER): Payer: Self-pay | Admitting: Specialist

## 2023-06-01 VITALS — BP 122/80 | HR 107 | Temp 98.0°F | Ht 65.5 in | Wt 216.9 lb

## 2023-06-01 DIAGNOSIS — K219 Gastro-esophageal reflux disease without esophagitis: Secondary | ICD-10-CM | POA: Insufficient documentation

## 2023-06-01 DIAGNOSIS — Z09 Encounter for follow-up examination after completed treatment for conditions other than malignant neoplasm: Secondary | ICD-10-CM | POA: Insufficient documentation

## 2023-06-01 DIAGNOSIS — R052 Subacute cough: Secondary | ICD-10-CM | POA: Insufficient documentation

## 2023-06-01 DIAGNOSIS — Z87898 Personal history of other specified conditions: Secondary | ICD-10-CM

## 2023-06-01 NOTE — Telephone Encounter (Signed)
CM contacted PT and went over Non Fasting guidelines for upcoming procedure on 08.21.24 with Dr Marnette Burgess. PT states they are not currently taking antibiotics. PT had no further questions at this time Clinic phone number was provided to PT at this time.    06/01/2023  Arminda Resides, CASE MANAGER

## 2023-06-01 NOTE — Progress Notes (Signed)
FAMILY MEDICINE, [facility]  [address]  [place] [address]  Operated by [facility]  Transitional Care Management Note    Name: [name] MRN:  [id number]   Date: 06/01/2023 Age: 67 y.o.     Chief Complaint: Follow Up and Hospital Discharge Transition       SUBJECTIVE:  [name] is a 67 y.o. female presenting today for follow-up after being discharged. The main problem requiring admission was chest pain and cough     No records available  CAD with recent PCI in June     Cardiac workup was unremarkable as per pt   Was told that could be muscular chest pain. Chest pain resolved since starting prednisone which she will complete in 3 days     CT chest was normal as per pt. Cough was attributed to GERD. Was told to take Protonix BID. This has not helped   Was advised to see ENT for eval     Dry cough x 1.5 months  All day long but worse at night   Was on lisinopril which was switched to Losartan   Using Flonase PRN  Using tessalon Perles and OTC cough    Recently started on Incruse Ellipta for COPD   Somewhat helped     OBJECTIVE:   BP 122/80 (Site: Left Arm, Patient Position: Sitting, Cuff Size: Adult)   Pulse (!) 107   Temp 36.7 C (98 F) (Thermal Scan)   Ht 1.664 m (5' 5.5")   Wt 98.4 kg (216 lb 14.9 oz)   SpO2 95%   BMI 35.55 kg/m     Physical Exam  Constitutional:       Appearance: Normal appearance.   HENT:      Right Ear: Tympanic membrane, ear canal and external ear normal.      Left Ear: Tympanic membrane, ear canal and external ear normal.      Mouth/Throat:      Pharynx: Oropharynx is clear. No oropharyngeal exudate.   Cardiovascular:      Rate and Rhythm: Normal rate and regular rhythm.      Heart sounds: No murmur heard.  Pulmonary:      Effort: Pulmonary effort is normal. No respiratory distress.      Breath sounds: Normal breath sounds. No wheezing.   Musculoskeletal:      Right lower leg: No edema.      Left lower leg: No edema.    Lymphadenopathy:      Cervical: No cervical adenopathy.         Transition of Care Contact Information  Discharge date: Discharge Date: Not Found  Transition Facility Type--Hospital (Inpatient or Observation)  Facility Name--[facility] Interactive Contact(s):  Clinical Staff Name/Role who Verdon Cummins, RN     Data Reviewed  Medication Reconciliation completed    Assessment and Plan    ICD-10-CM    1. Hospital discharge follow-up  Z09       2. Gastroesophageal reflux disease, unspecified whether esophagitis present  K21.9       3. Subacute cough  R05.2         Hospital records requested  Chest pain resolved  Cough is persistent.  Subacute cough   Unclear etiology  Could be gastroesophageal reflux disease.  Doubling Protonix has not helped   Could be postnasal drip.  Advised to use Flonase consistently   Could be from chronic obstructive pulmonary disease.  Patient will continue Incruse  Ellipta which has been helping   Could be due losartan.  However, risk is low with a ARBs  Referred to ENT for evaluation and possible laryngoscope  Follow-up in 1 month for re-evaluation.    Other transition actions (Optional) -: Discharge documentation has been requested but not yet obtained    Return in about 4 weeks (around 06/29/2023) for In Person Visit PCP.    Sharon Mt, MD      I saw and examined the patient.  I reviewed the fellow's note.  I agree with the findings and plan of care as documented in the fellow's note.  Any exceptions/additions are edited/noted.    Marchelle Gearing, MD

## 2023-06-08 ENCOUNTER — Ambulatory Visit (INDEPENDENT_AMBULATORY_CARE_PROVIDER_SITE_OTHER): Payer: Self-pay | Admitting: PHYSICIAN ASSISTANT

## 2023-06-08 ENCOUNTER — Ambulatory Visit (INDEPENDENT_AMBULATORY_CARE_PROVIDER_SITE_OTHER): Payer: Medicare (Managed Care) | Admitting: OTOLARYNGOLOGY

## 2023-06-08 ENCOUNTER — Ambulatory Visit (INDEPENDENT_AMBULATORY_CARE_PROVIDER_SITE_OTHER): Payer: Self-pay

## 2023-06-08 ENCOUNTER — Other Ambulatory Visit: Payer: Self-pay

## 2023-06-08 VITALS — BP 110/72 | HR 109 | Temp 97.3°F | Ht 65.0 in | Wt 217.2 lb

## 2023-06-08 DIAGNOSIS — R053 Chronic cough: Secondary | ICD-10-CM

## 2023-06-08 DIAGNOSIS — K219 Gastro-esophageal reflux disease without esophagitis: Secondary | ICD-10-CM

## 2023-06-08 DIAGNOSIS — J3089 Other allergic rhinitis: Secondary | ICD-10-CM

## 2023-06-08 MED ORDER — FAMOTIDINE 40 MG TABLET
40.0000 mg | ORAL_TABLET | Freq: Every evening | ORAL | 3 refills | Status: DC
Start: 2023-06-08 — End: 2023-06-20

## 2023-06-08 MED ORDER — DEXTROMETHORPHAN-GUAIFENESIN ER 60 MG-1,200 MG TAB,EXTEND RELEASE,12HR
1.0000 | ORAL_TABLET | Freq: Two times a day (BID) | ORAL | 1 refills | Status: AC
Start: 2023-06-08 — End: 2023-06-18

## 2023-06-08 MED ORDER — MONTELUKAST 10 MG TABLET
10.0000 mg | ORAL_TABLET | Freq: Every evening | ORAL | 3 refills | Status: DC
Start: 2023-06-08 — End: 2024-06-26

## 2023-06-08 NOTE — Progress Notes (Signed)
[facility]  DEPARTMENT OF OTOLARYNGOLOGY - HEAD AND NECK SURGERY  CLINIC H&P    Name: [name], 67 y.o. female  MRN: [id number]  Date of Birth: May 31, 1956  Date of Service: 06/08/2023    Chief Complaint:    Chief Complaint   Patient presents with   . Cough       History of Present Illness: [name] is a 67 y.o. female patient, who in June had heart attack, for which she had multiple stents put via catheter with cardiac cath. Then she began having severe nonstop coughing. Her symptoms became worse shortly after that and she had to be admitted in the hospital. Workup with multiple chest x-rays, discontinuation of Lisinopril, and different over-the-counter medication without any help. The patient has long lasting history of GERD and she is on Pantoprazole 40 mg in the morning before food. She also has long lasting allergies and she takes Careers adviser and uses Flonase. She has been also on inhaler. The cough is debilitating, not improving. She denies any productive cough, just dry. She was also on 2 different antibiotics for this without improvement.         Past Medical History:  Past Medical History:   Diagnosis Date   . Anxiety    . Arthritis    . Awareness under anesthesia     woke during colonoscopy   . Cancer (CMS HCC)     internal skin cancer   . Chest pain 02/19/2016   . Chronic pain     lower back   . Depression    . Dyspnea on exertion    . GERD (gastroesophageal reflux disease)     controlled w/med   . H/O hearing loss    . Headache    . Hearing loss    . Heart murmur 1979    benign   . History of coronary angioplasty with insertion of stent 05/09/2023   . Hyperlipidemia LDL goal < 100 08/29/2013    denies   . Migraine    . MINOR CAD (coronary artery disease) 11/10/2012    no treatment other than cholesterol medications; follows regularly with K.Dalton, PA   . Multinodular goiter    . Muscle weakness     neck    . Myocardial infarction (CMS HCC) 05/09/2023   . Neck problem     spinal  stenosis   . Obesity    . Peripheral edema    . Rash     face only eczema   . Rheumatic fever     in 1974 no complications   . S/P thyroidectomy    . Sleep apnea    . Squamous cell carcinoma 02/06/2015    right side of neck   . Thyroid disorder    . 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 Tinnitus    . Vaginal prolapse 2012    surgery improved significantly   . Wears glasses          Past Surgical History:  Past Surgical History:   Procedure Laterality Date   . Cataract extraction Right 12/07/2022   . Chalazion excision Left 12/22/2021   . Colonoscopy  01/06/2009   . Gastroscopy  03/05/2010   . Gastroscopy with biopsy  03/05/2010   . Hx adenoidectomy     . Hx ankle fracture tx  2007   . Hx appendectomy     . Hx cholecystectomy     . Hx colonoscopy     .  Hx cystocele repair  09/24/2009   . Hx hand surgery  2012   . Hx heart catheterization     . Hx hysterectomy     . Hx oophorectomy     . Hx parotidectomy      . Hx partial thyroidectomy Right 04/05/2019   . Hx tonsillectomy     . Hx total vaginal hysterectomy  1979   . Hx wisdom teeth extraction     . Hx wrist fracture tx Left 2007   . Parathyroid gland surgery     . Pars plana vitrectomy Right 11/02/2021   . Pb revise ulnar nerve at elbow Left 1979   . Pb upper gi endoscopy,biopsy  12/19/2007   . Septoplasty       Medications:  Outpatient Medications Marked as Taking for the 06/08/23 encounter (Office Visit) with Leeanne Deed, MD   Medication Sig   . aspirin 81 mg Oral Tablet, Chewable Chew 1 Tablet (81 mg total) Once a day   . baclofen (LIORESAL) 5 mg Oral Tablet Take 1 Tablet (5 mg total) by mouth Three times a day as needed   . BRILINTA 90 mg Oral Tablet Take 1 Tablet (90 mg total) by mouth Twice daily   . clobetasoL (TEMOVATE) 0.05 % Ointment Apply topically Twice daily   . desvenlafaxine (PRISTIQ) 100 mg Oral Tablet Sustained Release 24 hr Take 1 Tablet (100 mg total) by mouth Once a day   . Dextromethorphan-Guaifenesin (MUCINEX DM) 60-1,200 mg Oral  Tablet Sustained Release 12 hr Take 1 Tablet by mouth Twice daily for 10 days   . diclofenac sodium (VOLTAREN) 1 % Gel Apply topically Four times a day - before meals and bedtime   . famotidine (PEPCID) 40 mg Oral Tablet Take 1 Tablet (40 mg total) by mouth Every evening Before dinner   . losartan (COZAAR) 25 mg Oral Tablet Take 1 Tablet (25 mg total) by mouth Once a day   . montelukast (SINGULAIR) 10 mg Oral Tablet Take 1 Tablet (10 mg total) by mouth Every evening   . ondansetron (ZOFRAN) 4 mg Oral Tablet Take 1 Tablet (4 mg total) by mouth Every 8 hours as needed for Nausea/Vomiting   . pantoprazole (PROTONIX) 40 mg Oral Tablet, Delayed Release (E.C.) Take 1 Tablet (40 mg total) by mouth Once a day Takes in am   . rosuvastatin (CRESTOR) 20 mg Oral Tablet Take 1 Tablet (20 mg total) by mouth Every evening   . tiotropium bromide (SPIRIVA RESPIMAT) 2.5 mcg/actuation Inhalation oral inhaler Take 2 Inhalations (2 Puffs total) by inhalation Once a day   . umeclidinium (INCRUSE ELLIPTA) 62.5 mcg/actuation Inhalation oral inhaler Take 1 Inhalation by inhalation Once a day   . [DISCONTINUED] fluticasone propionate (FLONASE) 50 mcg/actuation Nasal Spray, Suspension ADMINISTER 2 SPRAYS INTO EACH NOSTRIL ONCE A DAY. (Patient taking differently: Administer 2 Sprays into each nostril Once a day Takes in am)   . [DISCONTINUED] gabapentin (NEURONTIN) 800 mg Oral Tablet Take 1 Tablet (800 mg total) by mouth Twice daily      Family History:  Family Medical History:       Problem Relation (Age of Onset)    Bipolar Disorder Daughter, Paternal Uncle    Breast Cancer Paternal Aunt, Paternal Aunt, Paternal Aunt, Paternal Aunt, Paternal Aunt, Other    Cancer Paternal Aunt, Paternal Aunt, Paternal Aunt, Paternal Aunt, Paternal Aunt, Other (59)    Congestive Heart Failure Father (50)    Coronary Artery Disease Father, Mother (58)  Diabetes Mother    Heart Attack Father, Sister    High Cholesterol Mother    Hypertension (High Blood  Pressure) Mother, Brother, Sister, Sister    Kidney Disease Sister    Leukemia Paternal Uncle    Stroke Paternal Grandmother    Thyroid Disease Sister            Social History:  Social History     Occupational History   . 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 Smoking status: Former     Current packs/day: 0.00     Average packs/day: 2.0 packs/day for 40.0 years (80.0 ttl pk-yrs)     Types: Cigarettes     Start date: 01/22/1965     Quit date: 01/22/2005     Years since quitting: 18.4   . Smokeless tobacco: Never   Vaping Use   . Vaping status: Never Used   Substance and Sexual Activity   . Alcohol use: No     Alcohol/week: 0.0 standard drinks of alcohol   . Drug use: No   . Sexual activity: Yes     Partners: Male     Allergies:  Allergies   Allergen Reactions   . Cymbalta [Duloxetine]      Rash on feet and hands   . Blue Dye      Told to avoid due to patch test result by Abundio Miu   . Flavoring Agent  Other Adverse Reaction (Add comment)     Patient unsure of reaction   . Lipitor [Atorvastatin] Myalgia     Muscle pain   . Nickel      Blisters  "Allergic to all kinds of metal"    . Codeine Itching     RASH   . Mobic [Meloxicam] Nausea/ Vomiting     Nervous and shakes    . Naprosyn [Naproxen] Nausea/ Vomiting   . Nsaids (Non-Steroidal Anti-Inflammatory Drug) Nausea/ Vomiting       Review of Systems:      Const: Denies   Eyes: Denies   ENMT: Denies  See HPI.  CV: Denies   Resp: Denies  GI: Denies   Musculo: Denies   Skin: Denies   Neuro: Denies   Psych: Denies   Endocrine: Denies   Hema/Lymph: Denies   Reviewed and updated                                                          All other systems reviewed and found to be negative.    Physical Exam:    BP (Non-Invasive): 110/72 Temperature: 36.3 C (97.3 F) Heart Rate: (!) 109      Height: 165.1 cm (5\' 5" ) Weight: 98.5 kg (217 lb 2.5 oz) Body mass index is 36.14 kg/m.      Const: Appears healthy and well  developed. No signs of apparent distress present. Communicates normally.  Head/face: Normal on inspection. Palpation reveals no bilateral sinus tenderness/tenderness forehead/tenderness cheeks. Salivary glands normal in size and texture. Facial strength normal.  House-Brackmann Grading: Grade I: normal facial function in all areas  Eyes: EOMI in both eyes and normal gaze bilaterally. Orbits exhibit normal position.  ENMT:  Ears: External ears WNL. Auditory canals normal. Bilateral tympanic  membranes are normal. Auditory acuity: Rinne, R:/L: and Weber. Normal to whispered voice.   Nose: External nose WNL. Internal nose: Nasal mucosal swelling. There are secretions. Septum with moderate sepal deviation, s-shaped. Inferior turbinates are hypertrophied. See nasal endoscopy.  Nasopharynx: Gag  Oral Cavity: Oral aperture is normal. Lips: Appear normal and healthy. Dentition is in good repair. Occlusion is normal. Gums appear healthy. Tongue appears normal. Oral mucosa moist with no thrush or mucositis.  Oropharynx: Appears normal. Soft palate normal in appearance. Tonsils appear normal. Posterior pharynx is normal.  Hypopharynx: Gag  Larynx: Gag  Neck: Normal to inspection. Unremarkable on palpation. No masses appreciated. Trachea midline. Thyroid is normal in size and texture.  Resp: Normal respiratory activities. No wheezing. Clear to auscultation bilaterally.  CV: Rate is regular. Rhythm is regular. No heart murmur appreciated.  Abdomen: Abdomen is soft, nontender, and nondistended.  Lymph: No visible or palpable anterior or posterior cervical lymphadenopathy.  Musculo: Walks with a normal gait. Temporomandibular joint: No obvious instability. No crepitus or tenderness bilaterally.  Skin: Skin is warm and dry.  Neuro: Mood is normal.  Cranial nerves: Cranial nerves I-XII intact.  Psych: Mood/Affect: Mood is normal. Affect is normal. Cognition: Orientation is intact.    Review of Information:    Pathology: No results  found for this or any previous visit (from the past 720 hour(s)).   Imaging:   Recent Results (from the past 720 hour(s))   XR CHEST PA AND LATERAL     Status: None    Narrative    Ashaki MARIE Mayford Knife  Female, 67 years old.    XR CHEST PA AND LATERAL performed on 05/20/2023 12:08 PM.    REASON FOR EXAM:  R05.9: Cough, unspecified type    TECHNIQUE: 2 views/2 images submitted for interpretation.    COMPARISON:  01/18/2023    FINDINGS:  Normal cardiomediastinal silhouette. No lobar consolidation,   pleural effusion, or pneumothorax. No acute bony abnormality..      Impression    No acute cardiopulmonary process.          Procedure:       Working Diagnosis:      ICD-10-CM    1. Chronic cough  R05.3       2. Laryngopharyngeal reflux (LPR)  K21.9       3. Other allergic rhinitis  J30.89             Assessment and Plan:    Nakhia Hamburger is a 67 y.o. female:     Unknown cause of her cough; due to acid reflux symptoms. Complaining with this cough, we will start her on Pepcid 40 mg before dinner and Mucinex DM twice daily.     Also due to allergy symptoms. We will start the patient on Singulair; prescription sent to pharmacy. If the patient is not improved, consider flexible laryngoscopy.     Follow up in 2 to 3 months, and as needed.     Orders Placed This Encounter   . famotidine (PEPCID) 40 mg Oral Tablet   . Dextromethorphan-Guaifenesin [facility] DM) 60-1,200 mg Oral Tablet Sustained Release 12 hr   . montelukast (SINGULAIR) 10 mg Oral Tablet           Leeanne Deed, MD     06/15/2023 12:28     Leeanne Deed, MD    CC:    PCP Ovidio Hanger, PA-C  [address]  [facility] [address]   Referring Provider Freida Busman, Silva Bandy,  PA-C  [address]  [place],  [place] [address]

## 2023-06-09 ENCOUNTER — Other Ambulatory Visit (HOSPITAL_BASED_OUTPATIENT_CLINIC_OR_DEPARTMENT_OTHER): Payer: Self-pay | Admitting: Physician Assistant

## 2023-06-09 ENCOUNTER — Other Ambulatory Visit (INDEPENDENT_AMBULATORY_CARE_PROVIDER_SITE_OTHER): Payer: Self-pay | Admitting: Otolaryngology

## 2023-06-09 DIAGNOSIS — J309 Allergic rhinitis, unspecified: Secondary | ICD-10-CM

## 2023-06-09 DIAGNOSIS — R7989 Other specified abnormal findings of blood chemistry: Secondary | ICD-10-CM

## 2023-06-10 NOTE — Telephone Encounter (Signed)
CM accidentally opened by mistake    06/10/2023  [name], CASE MANAGER

## 2023-06-10 NOTE — Telephone Encounter (Signed)
Lov 06/08/23

## 2023-06-12 NOTE — Telephone Encounter (Signed)
Results  Most recent to oldest [Reference Range]: 1   eGFR if non-African American [>=60 mL/min/1.77m] >60 mL/min/1.26m  (05/28/23 5:17 AM)   eGFR if African American [>=60 mL/min/1.7m] >60 mL/min/1.79m  (05/28/23 5:17 AM)   B-type Natriuretic Peptide [0-100 pg/mL] 62 pg/mL  (05/26/23 2:50 PM)   Cardiac Troponin I, HS [0-39 ng/L] 3 ng/L  (05/27/23 2:07 AM)   Albumin [3.4-5.0 g/dL] 3.7 g/dL  (10/30/08 9:60 PM)   ALT/SGPT [10-49 IUnit/L] 18 IUnit/L  (05/26/23 2:50 PM)   AST/SGOT [0-34 IUnit/L] 26 IUnit/L  (05/26/23 2:50 PM)   Total Bili [0.2-1.0 mg/dL] 0.4 mg/dL  (01/28/39 9:81 PM)   BUN [9-23 mg/dL] 10 mg/dL  (11/02/12 7:82 AM)   Calcium Level [8.7-10.4 mg/dL] 9.7 mg/dL  (06/29/61 1:30 AM)   Chloride [98-107 mmol/L] 113 mmol/L  *HI*  (05/28/23 5:17 AM)   Total CK [34-145 IUnit/L] 243 IUnit/L  *HI*  (05/26/23 2:50 PM)   CO2 [20-31 mmol/L] 26 mmol/L  (05/28/23 5:17 AM)   Creatinine [0.55-1.02 mg/dL] 8.65 mg/dL  (05/01/45 9:62 AM)   Glucose Level [74-106 mg/dL] 952 mg/dL  *HI*  (05/28/12 2:44 AM)   Labs from recent admission. Gabapentin 800 mg BID appropriate.    Markan Cazarez Dalton, PA-C

## 2023-06-15 ENCOUNTER — Ambulatory Visit (INDEPENDENT_AMBULATORY_CARE_PROVIDER_SITE_OTHER): Payer: Self-pay | Admitting: Rehabilitative and Restorative Service Providers"

## 2023-06-15 ENCOUNTER — Ambulatory Visit (INDEPENDENT_AMBULATORY_CARE_PROVIDER_SITE_OTHER): Payer: Self-pay | Admitting: Specialist

## 2023-06-20 ENCOUNTER — Encounter (HOSPITAL_BASED_OUTPATIENT_CLINIC_OR_DEPARTMENT_OTHER): Payer: Self-pay | Admitting: Physician Assistant

## 2023-06-20 ENCOUNTER — Other Ambulatory Visit: Payer: Self-pay

## 2023-06-20 ENCOUNTER — Ambulatory Visit: Payer: Medicare (Managed Care) | Attending: Physician Assistant | Admitting: Physician Assistant

## 2023-06-20 VITALS — BP 134/82 | HR 92 | Temp 97.5°F | Ht 65.24 in | Wt 215.8 lb

## 2023-06-20 DIAGNOSIS — R053 Chronic cough: Secondary | ICD-10-CM | POA: Insufficient documentation

## 2023-06-20 DIAGNOSIS — R32 Unspecified urinary incontinence: Secondary | ICD-10-CM | POA: Insufficient documentation

## 2023-06-20 DIAGNOSIS — R300 Dysuria: Secondary | ICD-10-CM | POA: Insufficient documentation

## 2023-06-20 LAB — URINALYSIS, MACROSCOPIC
BLOOD: NEGATIVE mg/dL
GLUCOSE: NEGATIVE mg/dL
KETONES: NEGATIVE mg/dL
LEUKOCYTES: NEGATIVE WBCs/uL
NITRITE: POSITIVE — AB
PH: 7 (ref 5.0–8.0)
PROTEIN: NEGATIVE mg/dL
SPECIFIC GRAVITY: 1.021 (ref 1.005–1.030)
UROBILINOGEN: 6 mg/dL — AB

## 2023-06-20 LAB — URINALYSIS, MICROSCOPIC
RBCS: <1 /hpf (ref <6.0)
WBCS: 3 /hpf (ref <11.0)

## 2023-06-20 MED ORDER — FAMOTIDINE 40 MG TABLET
40.0000 mg | ORAL_TABLET | Freq: Two times a day (BID) | ORAL | 1 refills | Status: DC
Start: 2023-06-20 — End: 2023-08-03

## 2023-06-20 MED ORDER — SULFAMETHOXAZOLE 400 MG-TRIMETHOPRIM 80 MG TABLET
1.0000 | ORAL_TABLET | Freq: Two times a day (BID) | ORAL | 0 refills | Status: AC
Start: 2023-06-20 — End: 2023-06-25

## 2023-06-20 MED ORDER — FAMOTIDINE 40 MG TABLET
40.0000 mg | ORAL_TABLET | Freq: Two times a day (BID) | ORAL | 1 refills | Status: DC
Start: 2023-06-20 — End: 2023-06-20

## 2023-06-20 MED ORDER — METOPROLOL SUCCINATE ER 25 MG TABLET,EXTENDED RELEASE 24 HR
25.0000 mg | ORAL_TABLET | Freq: Every day | ORAL | 4 refills | Status: DC
Start: 2023-06-20 — End: 2024-03-06

## 2023-06-20 MED ORDER — METOPROLOL SUCCINATE ER 25 MG TABLET,EXTENDED RELEASE 24 HR
25.0000 mg | ORAL_TABLET | Freq: Every day | ORAL | 4 refills | Status: DC
Start: 2023-06-20 — End: 2023-06-20

## 2023-06-20 NOTE — Progress Notes (Addendum)
Department of Family Medicine   Progress Note    [name]  MRN: [id number]  DOB: 01-Apr-1956  Date of Service: 06/20/2023    CHIEF COMPLAINT  Chief Complaint   Patient presents with    Cough       SUBJECTIVE  [name] is a 67 y.o. female who presents to clinic for follow up of chronic cough.     She has history of heart attack with placement of multiple stents in June 2024. Her cough started soon after that. She reports that it's a dry, nonproductive cough continuous throughout the day but worse at night and when lying down. She reports that solid food sometimes gets stuck in her throat, which clears when she drinks a lot of water. Pt associates shortness of breath when she's walking. She sts that her inhalers exacerbate the cough, but the nebulizer helps a little bit. She was seen by ENT on 06/08/23 and was told to take Protonix in the morning, Pepcid with her evening meal, Mucinex, and Singulair. She denies any symptomatic improvement since starting those. She has a follow up scheduled with ENT in 2 months; if the cough is not better, they are considering a flexible sigmoidoscopy. She is still on Losartan. Pt has a colonoscopy scheduled at the end of October.     Pt was seen in urgent care on 06/14/23 with symptoms of UTI. She was given Macrobid then told that her urine did not show an infection. She reports having strong smelling urine, increased frequency, inability to control her bladder, and small amounts of urine. She sts that her UA at urgent care showed microscopic hematuria. She associated a sharp shooting pain starting in her urethral region going up through her abdomen and down both arms. She denies any suprapubic tenderness or flank/back pain. She has 2 doses of Macrobid left and sts that it hasn't helped. She's been taking Azo with some symptomatic relief. She has an appointment scheduled with urology in October.     She also reports a red itchy rash in her inguinal folds that's been  present for a long time. She reports using a powder meant for athlete's foot with some symptomatic relief. She sts that her insurance will not cover nystatin powder, but she's getting new insurance on Sept. 1.       Review of Systems:  Positive ROS discussed in HPI, otherwise all other systems negative.      Medications:   albuterol sulfate (PROAIR HFA) 90 mcg/actuation Inhalation oral inhaler, Take 2 Puffs by inhalation Every 4 hours as needed  aspirin 81 mg Oral Tablet, Chewable, Chew 1 Tablet (81 mg total) Once a day  baclofen (LIORESAL) 5 mg Oral Tablet, Take 1 Tablet (5 mg total) by mouth Three times a day as needed  BRILINTA 90 mg Oral Tablet, Take 1 Tablet (90 mg total) by mouth Twice daily  clobetasoL (TEMOVATE) 0.05 % Ointment, Apply topically Twice daily  desvenlafaxine (PRISTIQ) 100 mg Oral Tablet Sustained Release 24 hr, Take 1 Tablet (100 mg total) by mouth Once a day  diclofenac sodium (VOLTAREN) 1 % Gel, Apply topically Four times a day - before meals and bedtime  fluticasone propionate (FLONASE) 50 mcg/actuation Nasal Spray, Suspension, INSTILL 2 SPRAYS INTO EACH NOSTRIL ONCE A DAY  gabapentin (NEURONTIN) 800 mg Oral Tablet, Take 1 Tablet (800 mg total) by mouth Twice daily  montelukast (SINGULAIR) 10 mg Oral Tablet, Take 1 Tablet (10 mg total) by mouth Every evening  nitrofurantoin  monohyd/m-cryst (MACROBID) 100 mg Oral Capsule, Take 1 Capsule (100 mg total) by mouth Twice daily  nitroGLYCERIN (NITROSTAT) 0.4 mg Sublingual Tablet, Sublingual, DISSOLVE 1 TABLET UNDER THE TONGUE AS NEEDED FOR CHEST PAIN EVERY 5 MINUTES UP TO 3 TIMES. IF NO RELIEF CALL 911.  ondansetron (ZOFRAN) 4 mg Oral Tablet, Take 1 Tablet (4 mg total) by mouth Every 8 hours as needed for Nausea/Vomiting  pantoprazole (PROTONIX) 40 mg Oral Tablet, Delayed Release (E.C.), Take 1 Tablet (40 mg total) by mouth Once a day Takes in am  rosuvastatin (CRESTOR) 20 mg Oral Tablet, Take 1 Tablet (20 mg total) by mouth Every  evening  tiotropium bromide (SPIRIVA RESPIMAT) 2.5 mcg/actuation Inhalation oral inhaler, Take 2 Inhalations (2 Puffs total) by inhalation Once a day  umeclidinium (INCRUSE ELLIPTA) 62.5 mcg/actuation Inhalation oral inhaler, Take 1 Inhalation by inhalation Once a day  famotidine (PEPCID) 40 mg Oral Tablet, Take 1 Tablet (40 mg total) by mouth Every evening Before dinner  losartan (COZAAR) 25 mg Oral Tablet, Take 1 Tablet (25 mg total) by mouth Once a day    No facility-administered medications prior to visit.      Allergies:   Allergies   Allergen Reactions    Cymbalta [Duloxetine]      Rash on feet and hands    Blue Dye      Told to avoid due to patch test result by [facility]    Flavoring Agent  Other Adverse Reaction (Add comment)     Patient unsure of reaction    Lipitor [Atorvastatin] Myalgia     Muscle pain    Nickel      Blisters  "Allergic to all kinds of metal"     Codeine Itching     RASH    Mobic [Meloxicam] Nausea/ Vomiting     Nervous and shakes     Naprosyn [Naproxen] Nausea/ Vomiting    Nsaids (Non-Steroidal Anti-Inflammatory Drug) Nausea/ Vomiting         OBJECTIVE  BP 134/82   Pulse 92   Temp 36.4 C (97.5 F) (Thermal Scan)   Ht 1.657 m (5' 5.24") Comment: shoes  Wt 97.9 kg (215 lb 13.3 oz) Comment: shoes  SpO2 97%   BMI 35.66 kg/m       General: no distress  Head: NCAT   Lungs: clear to auscultation bilaterally  Cardiovascular: RRR, no murmur  Abdomen: soft, non tender, bowel sounds present; no suprapubic TTP or CVA tenderness   Extremities: no cyanosis or edema  Skin: warm and dry, no rash  Neurologic: AOx3, CN 2-12 grossly intact  Psychiatric: normal affect and behavior      ASSESSMENT/PLAN    (R05.3) Chronic cough  (primary encounter diagnosis)  Plan: chronic, uncontrolled   Discontinue Losartan.   Start metoprolol succinate 25 mg once daily.   Increased Pepcid from 40 mg once daily to 40 mg twice daily.   Continue using inhalers/nebulizers and the regimen from ENT.   Follow up with ENT  as scheduled.   If medication adjustments don't help, consider oral fluticasone for potential eosinophilic esophagitis.     (R32) Urinary incontinence  Plan: acute   Increase water intake to five 16oz bottles daily.   Continue using Azo for symptomatic relief.   Ordered urinalysis and urine culture.     Consider nystatin powder if inguinal rash is still present at next appointment when patient's insurance changes.        Orders Placed This Encounter  metoprolol succinate (TOPROL-XL) 25 mg Oral Tablet Sustained Release 24 hr    famotidine (PEPCID) 40 mg Oral Tablet         Keep scheduled follow up.        Soledad Gerlach, STUDENT PHYSICIAN ASSISTANT 06/20/2023, 10:57    I have seen and examined the patient in concert with the PA student as noted above.  The above note has been modified as necessary by me to reflect my own, personal collection/validation of the patient's HPI, patient history, physical examination, and assessment and plan.    Julianny Milstein Dalton, PA-C

## 2023-06-20 NOTE — Progress Notes (Deleted)
Department of Family Medicine   Progress Note    [name]  MRN: [id number]  DOB: Jun 02, 1956  Date of Service: 06/20/2023    CHIEF COMPLAINT  Chief Complaint   Patient presents with    Annual Exam       SUBJECTIVE  [name] is a 67 y.o. female who presents to clinic for ***.             Review of Systems:  Positive ROS discussed in HPI, otherwise all other systems negative.      Medications:   albuterol sulfate (PROAIR HFA) 90 mcg/actuation Inhalation oral inhaler, Take 2 Puffs by inhalation Every 4 hours as needed  aspirin 81 mg Oral Tablet, Chewable, Chew 1 Tablet (81 mg total) Once a day  baclofen (LIORESAL) 5 mg Oral Tablet, Take 1 Tablet (5 mg total) by mouth Three times a day as needed  BRILINTA 90 mg Oral Tablet, Take 1 Tablet (90 mg total) by mouth Twice daily  clobetasoL (TEMOVATE) 0.05 % Ointment, Apply topically Twice daily  desvenlafaxine (PRISTIQ) 100 mg Oral Tablet Sustained Release 24 hr, Take 1 Tablet (100 mg total) by mouth Once a day  diclofenac sodium (VOLTAREN) 1 % Gel, Apply topically Four times a day - before meals and bedtime  famotidine (PEPCID) 40 mg Oral Tablet, Take 1 Tablet (40 mg total) by mouth Every evening Before dinner  fluticasone propionate (FLONASE) 50 mcg/actuation Nasal Spray, Suspension, INSTILL 2 SPRAYS INTO EACH NOSTRIL ONCE A DAY  gabapentin (NEURONTIN) 800 mg Oral Tablet, Take 1 Tablet (800 mg total) by mouth Twice daily  losartan (COZAAR) 25 mg Oral Tablet, Take 1 Tablet (25 mg total) by mouth Once a day  montelukast (SINGULAIR) 10 mg Oral Tablet, Take 1 Tablet (10 mg total) by mouth Every evening  nitrofurantoin monohyd/m-cryst (MACROBID) 100 mg Oral Capsule, Take 1 Capsule (100 mg total) by mouth Twice daily  nitroGLYCERIN (NITROSTAT) 0.4 mg Sublingual Tablet, Sublingual, DISSOLVE 1 TABLET UNDER THE TONGUE AS NEEDED FOR CHEST PAIN EVERY 5 MINUTES UP TO 3 TIMES. IF NO RELIEF CALL 911.  ondansetron (ZOFRAN) 4 mg Oral Tablet, Take 1 Tablet (4 mg total)  by mouth Every 8 hours as needed for Nausea/Vomiting  pantoprazole (PROTONIX) 40 mg Oral Tablet, Delayed Release (E.C.), Take 1 Tablet (40 mg total) by mouth Once a day Takes in am  rosuvastatin (CRESTOR) 20 mg Oral Tablet, Take 1 Tablet (20 mg total) by mouth Every evening  tiotropium bromide (SPIRIVA RESPIMAT) 2.5 mcg/actuation Inhalation oral inhaler, Take 2 Inhalations (2 Puffs total) by inhalation Once a day  umeclidinium (INCRUSE ELLIPTA) 62.5 mcg/actuation Inhalation oral inhaler, Take 1 Inhalation by inhalation Once a day    No facility-administered medications prior to visit.      Allergies:   Allergies   Allergen Reactions    Cymbalta [Duloxetine]      Rash on feet and hands    Blue Dye      Told to avoid due to patch test result by [facility]    Flavoring Agent  Other Adverse Reaction (Add comment)     Patient unsure of reaction    Lipitor [Atorvastatin] Myalgia     Muscle pain    Nickel      Blisters  "Allergic to all kinds of metal"     Codeine Itching     RASH    Mobic [Meloxicam] Nausea/ Vomiting     Nervous and shakes     Naprosyn [Naproxen] Nausea/ Vomiting  Nsaids (Non-Steroidal Anti-Inflammatory Drug) Nausea/ Vomiting         OBJECTIVE  BP 134/82   Pulse 92   Temp 36.4 C (97.5 F) (Thermal Scan)   Ht 1.657 m (5' 5.24") Comment: shoes  Wt 97.9 kg (215 lb 13.3 oz) Comment: shoes  SpO2 97%   BMI 35.66 kg/m       General: no distress  HENT: TMs clear, mouth mucous membranes moist, pharynx without injection or exudate   Lungs: clear to auscultation bilaterally  Cardiovascular: RRR, no murmur  Abdomen: soft, non tender, bowel sounds present  Extremities: no cyanosis or edema  Skin: warm and dry, no rash  Neurologic: gait is normal, AOx3, CN 2-12 grossly intact  Psychiatric: normal affect and behavior    ASSESSMENT/PLAN  No diagnosis found.      No orders of the defined types were placed in this encounter.        No follow-ups on file.    On the day of the encounter, a total of  *** ([phone],  [phone], [phone]) minutes was spent on this patient encounter including review of historical information, examination, documentation and post-visit activities.     Ovidio Hanger, PA-C 06/20/2023, 10:41

## 2023-06-21 LAB — URINE CULTURE: URINE CULTURE: <10000

## 2023-06-28 ENCOUNTER — Encounter (HOSPITAL_COMMUNITY): Payer: Self-pay | Admitting: CARDIOVASCULAR DISEASE

## 2023-06-28 ENCOUNTER — Other Ambulatory Visit: Payer: Self-pay

## 2023-06-28 ENCOUNTER — Inpatient Hospital Stay (HOSPITAL_BASED_OUTPATIENT_CLINIC_OR_DEPARTMENT_OTHER)
Admission: RE | Admit: 2023-06-28 | Discharge: 2023-06-28 | Disposition: A | Discharge status: Home / Self Care | Payer: Medicare (Managed Care) | Source: Ambulatory Visit | Attending: Cardiovascular Disease | Admitting: Cardiovascular Disease

## 2023-06-28 ENCOUNTER — Ambulatory Visit: Payer: Medicare (Managed Care) | Attending: Cardiovascular Disease | Admitting: CARDIOVASCULAR DISEASE

## 2023-06-28 VITALS — BP 136/82 | HR 75 | Temp 97.3°F | Ht 65.0 in | Wt 213.2 lb

## 2023-06-28 DIAGNOSIS — Z955 Presence of coronary angioplasty implant and graft: Secondary | ICD-10-CM

## 2023-06-28 DIAGNOSIS — Z7689 Persons encountering health services in other specified circumstances: Secondary | ICD-10-CM | POA: Insufficient documentation

## 2023-06-28 DIAGNOSIS — K219 Gastro-esophageal reflux disease without esophagitis: Secondary | ICD-10-CM

## 2023-06-28 DIAGNOSIS — R059 Cough, unspecified: Secondary | ICD-10-CM

## 2023-06-28 DIAGNOSIS — I2510 Atherosclerotic heart disease of native coronary artery without angina pectoris: Secondary | ICD-10-CM

## 2023-06-28 DIAGNOSIS — I219 Acute myocardial infarction, unspecified: Secondary | ICD-10-CM | POA: Insufficient documentation

## 2023-06-28 NOTE — Progress Notes (Signed)
[facility]  [address]  [place] [address]  Phone: [phone]  Fax: [phone]    PATIENT NAME: [name] NUMBER: [id number]  DATE OF SERVICE: 07/02/23  DATE OF BIRTH: 09-Jan-1956  Age: 67 y.o.    Requesting Physician: Ovidio Hanger, PA-C     NEWPatient Clinic Visit     Chief Concern     Chief Complaint   Patient presents with    Establish Care       History of Presenting Illness/Subjective     66 year old female with coronary artery disease s/p recent PCI x3 w/ DES x2 to LAD & x1 to Lcx, dyslipidemia, GERD. Patient presents to clinic today to establish care.     Patient was recently admitted to [facility] general with complaints of chest pain. She had ruled out for ACS. She did undergo a cardiac catheterization on 04/18/2023 for which she received PCI with stent placement to the LAD and left circumflex. She was discharged home on aspirin and Brilinta. The patient tells me that she felt good for approximately 2 weeks after her discharge. Over the last few months she has developed a persistent cough. Patient medications were switched with stoppage of her ACE/ARB but she continues to have cough. She is on many inhalers but doesn't see pulmonary.    The patient tells me she was evaluated by her PCP who ordered a chest x-ray which was unremarkable at that time. She did also have a WBC elevated by her report. She has not had a follow-up since then. She does tell me over the past several days her symptoms have progressively worsened.    Patient Echocardiogram showed normal LVEF w/o valvular disease.     Problem List                              Patient Active Problem List   Diagnosis    Migraine Headaches    GERD not well controlled    Patellofemoral pain syndrome    Multinodular goiter    Microscopic hematuria    Neck pain    Lichen sclerosus et atrophicus of the vulva    Carpal tunnel syndrome on right    Osteoarthritis, knee    Left knee pain    Chest pain at rest     MINOR CAD (coronary artery disease)    Hyperlipidemia LDL goal < 100    Otitis media of both ears    Mass of parotid gland, right    Squamous cell carcinoma    Chest pain    Dysuria    Multiple pulmonary nodules determined by computed tomography of lung    History of rheumatic fever    Neuropathy (CMS HCC)    PTSD (post-traumatic stress disorder)    Pain due to neuropathy of facial nerve    Lung nodule < 6cm on CT    S/P thyroidectomy    GAD (generalized anxiety disorder)    Severe episode of recurrent major depressive disorder, 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)    Right hip pain    Hand pain    Cervical neck pain with evidence of disc disease    Lumbar pain with radiation down right leg    History of coronary angioplasty with insertion of stent    Myocardial infarction (CMS HCC)    Severe obesity (BMI 35.0-35.9 with comorbidity) (CMS HCC)       Past  Medical History     Current Outpatient Medications   Medication Sig    albuterol sulfate (PROAIR HFA) 90 mcg/actuation Inhalation oral inhaler Take 2 Puffs by inhalation Every 4 hours as needed    aspirin 81 mg Oral Tablet, Chewable Chew 1 Tablet (81 mg total) Once a day    baclofen (LIORESAL) 5 mg Oral Tablet Take 1 Tablet (5 mg total) by mouth Three times a day as needed    BRILINTA 90 mg Oral Tablet Take 1 Tablet (90 mg total) by mouth Twice daily    clobetasoL (TEMOVATE) 0.05 % Ointment Apply topically Twice daily    desvenlafaxine (PRISTIQ) 100 mg Oral Tablet Sustained Release 24 hr Take 1 Tablet (100 mg total) by mouth Once a day    diclofenac sodium (VOLTAREN) 1 % Gel Apply topically Four times a day - before meals and bedtime    famotidine (PEPCID) 40 mg Oral Tablet Take 1 Tablet (40 mg total) by mouth Twice daily Before dinner    fluticasone propionate (FLONASE) 50 mcg/actuation Nasal Spray, Suspension INSTILL 2 SPRAYS INTO EACH NOSTRIL ONCE A DAY    gabapentin (NEURONTIN) 800 mg Oral Tablet Take 1 Tablet (800 mg total) by mouth Twice daily    metoprolol  succinate (TOPROL-XL) 25 mg Oral Tablet Sustained Release 24 hr Take 1 Tablet (25 mg total) by mouth Once a day    montelukast (SINGULAIR) 10 mg Oral Tablet Take 1 Tablet (10 mg total) by mouth Every evening    nitroGLYCERIN (NITROSTAT) 0.4 mg Sublingual Tablet, Sublingual DISSOLVE 1 TABLET UNDER THE TONGUE AS NEEDED FOR CHEST PAIN EVERY 5 MINUTES UP TO 3 TIMES. IF NO RELIEF CALL 911.    ondansetron (ZOFRAN) 4 mg Oral Tablet Take 1 Tablet (4 mg total) by mouth Every 8 hours as needed for Nausea/Vomiting    pantoprazole (PROTONIX) 40 mg Oral Tablet, Delayed Release (E.C.) Take 1 Tablet (40 mg total) by mouth Once a day Takes in am    rosuvastatin (CRESTOR) 20 mg Oral Tablet Take 1 Tablet (20 mg total) by mouth Every evening    tiotropium bromide (SPIRIVA RESPIMAT) 2.5 mcg/actuation Inhalation oral inhaler Take 2 Inhalations (2 Puffs total) by inhalation Once a day    umeclidinium (INCRUSE ELLIPTA) 62.5 mcg/actuation Inhalation oral inhaler Take 1 Inhalation by inhalation Once a day     Allergies   Allergen Reactions    Cymbalta [Duloxetine]      Rash on feet and hands    Blue Dye      Told to avoid due to patch test result by [employer]  Other Adverse Reaction (Add comment)     Patient unsure of reaction    Lipitor [Atorvastatin] Myalgia     Muscle pain    Nickel      Blisters  "Allergic to all kinds of metal"     Codeine Itching     RASH    Mobic [Meloxicam] Nausea/ Vomiting     Nervous and shakes     Naprosyn [Naproxen] Nausea/ Vomiting    Nsaids (Non-Steroidal Anti-Inflammatory Drug) Nausea/ Vomiting     Past Medical History:   Diagnosis Date    Anxiety     Arthritis     Awareness under anesthesia     woke during colonoscopy    Cancer (CMS HCC)     internal skin cancer    Chest pain 02/19/2016    Chronic pain     lower back  Depression     Dyspnea on exertion     GERD (gastroesophageal reflux disease)     controlled w/med    H/O hearing loss     Headache     Hearing loss     Heart murmur 1979     benign    History of coronary angioplasty with insertion of stent 05/09/2023    Hyperlipidemia LDL goal < 100 08/29/2013    denies    Migraine     MINOR CAD (coronary artery disease) 11/10/2012    no treatment other than cholesterol medications; follows regularly with K.Dalton, PA    Multinodular goiter     Muscle weakness     neck     Myocardial infarction (CMS HCC) 05/09/2023    Neck problem     spinal stenosis    Obesity     Peripheral edema     Rash     face only eczema    Rheumatic fever     in 1974 no complications    S/P thyroidectomy     Sleep apnea     Squamous cell carcinoma 02/06/2015    right side of neck    Thyroid disorder     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    Tinnitus     Vaginal prolapse 2012    surgery improved significantly    Wears glasses          Past Surgical History:   Procedure Laterality Date    CATARACT EXTRACTION Right 12/07/2022    CHALAZION EXCISION Left 12/22/2021    and conj lesion excision from caruncle    COLONOSCOPY  01/06/2009    COLONOSCOPY performed by Jan Fireman, AHMED F at [facility] OR ENDO    GASTROSCOPY  03/05/2010    GASTROSCOPY performed by Boyd Kerbs, SWATI at [facility] OR ENDO    GASTROSCOPY WITH BIOPSY  03/05/2010    GASTROSCOPY WITH BIOPSY performed by Knox Saliva at [facility] OR ENDO    HX ADENOIDECTOMY      HX ANKLE FRACTURE TX  2007    left distal fibula, casted    HX APPENDECTOMY      HX CHOLECYSTECTOMY      HX COLONOSCOPY      HX CYSTOCELE REPAIR  09/24/2009    HX HAND SURGERY  2012    for Carpal tunnel : right side treated    HX HEART CATHETERIZATION      HX HYSTERECTOMY      HX OOPHORECTOMY      left ovary removed    HX PAROTIDECTOMY       "surgical clamp"     HX PARTIAL THYROIDECTOMY Right 04/05/2019    R hemithyroidectomy for nodule, final path follicular adenoma    HX TONSILLECTOMY      HX TOTAL VAGINAL HYSTERECTOMY  1979    HX WISDOM TEETH EXTRACTION      HX WRIST FRACTURE TX Left 2007    FOOSH injury    PARATHYROID GLAND SURGERY      PARS PLANA VITRECTOMY Right 11/02/2021     Pars plana vitrectomy, nternal limiting membrane (ILM) and epiretinal membrane (ERM) peel, Injection of 20%  sf6 gas, Subtenon's injection of Cefuroxime 50 mg and Dexamethasone 2 mg    PB REVISE ULNAR NERVE AT ELBOW Left 1979    PB UPPER GI ENDOSCOPY,BIOPSY  12/19/2007    patulous GE junction zone, erythema, nonerosive GERD    SEPTOPLASTY  Family History     Family Medical History:       Problem Relation (Age of Onset)    Bipolar Disorder Daughter, Paternal Uncle    Breast Cancer Paternal Aunt, Paternal Aunt, Paternal Aunt, Paternal Aunt, Paternal Aunt, Other    Cancer Paternal Aunt, Paternal Aunt, Paternal Aunt, Paternal Aunt, Paternal Aunt, Other (49)    Congestive Heart Failure Father (50)    Coronary Artery Disease Father, Mother (4)    Diabetes Mother    Heart Attack Father, Sister    High Cholesterol Mother    Hypertension (High Blood Pressure) Mother, Brother, Sister, Sister    Kidney Disease Sister    Leukemia Paternal Uncle    Stroke Paternal Grandmother    Thyroid Disease Sister              Social History        Social History     Socioeconomic History    Marital status: Divorced    Number of children: 3   Occupational History    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     Employer: NOT EMPLOYED     Comment: occasionally   Tobacco Use    Smoking status: Former     Current packs/day: 0.00     Average packs/day: 2.0 packs/day for 40.0 years (80.0 ttl pk-yrs)     Types: Cigarettes     Start date: 01/22/1965     Quit date: 01/22/2005     Years since quitting: 18.4    Smokeless tobacco: Never   Vaping Use    Vaping status: Never Used   Substance and Sexual Activity    Alcohol use: No     Alcohol/week: 0.0 standard drinks of alcohol    Drug use: No    Sexual activity: Yes     Partners: Male   Other Topics Concern    Uses Cane No    Uses walker No    Uses wheelchair No    Right hand dominant Yes    Left hand dominant No    Ambidextrous No    Ability to Walk 1 Flight of Steps without SOB/CP Yes    Routine  Exercise No    Ability to Walk 2 Flight of Steps without SOB/CP No     Comment: SOB    Ability To Do Own ADL's Yes    Uses Walker No    Other Activity Level Yes     Comment: housework     Uses Cane No    Shift Work No    Unusual Sleep-Wake Schedule No   Social History Narrative    Right handed.        Review Of Systems      All review of systems negative except as otherwise noted in the HPI.    Physical Examination     BP 136/82 (Site: Right Arm)   Pulse 75   Temp 36.3 C (97.3 F)   Ht 1.651 m (5\' 5" )   Wt 96.7 kg (213 lb 3 oz)   SpO2 96%   BMI 35.48 kg/m         GENERAL: Patient is alert oriented to time, place and person. No acute distress  HEENT:  Head is normocephalic, atraumatic.  Extraocular motions are intact.  Sclerae are nonicteric.   NECK:  Supple, without JVD or carotid bruits.   LUNGS:  Breath sounds are clear to auscultation bilaterally.   CARDIOVASCULAR:  Reveals a regular rate and rhythm with normal S1 and  S2. No murmur, rub or gallop present  ABDOMEN:  Soft and non-distended with no visceromegaly.  LOWER EXTREMITIES:  Appear well-perfused without edema.    Transthoracic Echocardiogram     Results for orders placed or performed during the hospital encounter of 02/19/16   TRANSTHORACIC ECHOCARDIOGRAM - ADULT    Narrative     Left ventricle: LV wall thickness is mildly increased. Concentric   hypertrophy present. The systolic function is normal with an ejection   fraction range of 55% - 60%. LV diastolic function is normal. There is no   evidence of increased ventricular filling pressure by Doppler parameters.        *Note: Due to a large number of results and/or encounters for the requested time period, some results have not been displayed. A complete set of results can be found in Results Review.     Left ventricle: EF 55-60%  Right ventricle: The cavity size is dilated. Wall thickness is normal. Systolic function is reduced. Systolic pressure is within the normal range. The estimated peak  pressure is 30 mm Hg assuming that the right atrial pressure was 8 mmlg.  Left atrium: The atrium is normal in size.  Atrial septum: The septum was structurally nornal.  Right atrium: The atrium is normal in size.  Mitral valve: The valve is structurally normal. No stenosis. There is trivial regurgitation. The mean diastolic gradient is 2 mm Hg. The valve area (LVOT continuity) is 2.3 cm?.  Tricuspid valve: The valve is structurally normal. No stenosis. There is trivial regurgitation. The peak diastolic gradient is 2 mm Hg.  Aortic valve: The valve is structurally normal. The valve is trileaffet. Cusp separation is normal. Velocity is within the normal range. There is no stenosis. There is no regurgitation. The mean systolic gradient is 4 mm Hg. The LVOT to aortic valve VTI ratio is 0.66. The valve area by the velocity-time integral method is 2.0 cm*.  Pulmonic valve: Not weil visualized. No stenosis. There is no significant regurgitation. The peak systolic gradient is 4 mm      Percutaneous coronary intervention with stent placement of left anterior descending using a 3.0  40 overlapped with x a 3.0 x 13 [place] stents.  5. Percutaneous coronary intervention with stent placement of left circumflex using a 3.0 x 18 [place] stent.  6. Intravascular ultrasound of left anterior descending.  7. Intravascular ultrasound of left circumflex.    Labs     The ASCVD Risk score (Arnett DK, et al., 2019) failed to calculate for the following reasons:    The patient has a prior MI or stroke diagnosis    Lab Results   Component Value Date    TRIG 134 05/20/2023    HDLCHOL 45 (L) 05/20/2023    LDLCHOL 99 05/20/2023    CHOLESTEROL 168 05/20/2023        Lab Results   Component Value Date    SODIUM 139 05/20/2023    POTASSIUM 4.1 05/20/2023    BUN 20 05/20/2023    CREATININE 1.33 (H) 05/20/2023    GFR 44 (L) 05/20/2023    GLUCOSENF 101 03/05/2019    GLUCOSEFAST 104 08/29/2013         Assessment & Plan       67 yo F w/  Hx of CAD s/p PCI x3 to LAD & Lcx who presents to clinic for establishing care.    Impression:    #CAD s/p PCI x3 w/ DES x2 to LAD &  x1 to the Lcx  #HTN  #HLD  #Elevated BMI at 35.44  #Cough      Plan:    - Agree w/ OMT w/ DAPT & BB. DAPT for 12 months at least f/b monotherapy  - Agree w/ DC ACEI/ARB for now  - Uptitrate GERD meds  - Obtain PFT's & CXR  - Refer to pulm  - RTC in 3 months        Orders Placed This Encounter    XR CHEST AP AND LATERAL    Refer to [facility] Pulmonary    ECG 12-LEAD - ADD ON IN HVI CLINICS ONLY    PULMONARY FUNCTION TESTING - ADULT - PERFORM AT [facility]         Return in about 3 months (around 09/27/2023).        / Nicky Pugh, MD  Cardiology Fellow, PGY-V  Division of Cardiovascular Medicine  Department of Medicine  [facility]  Pager - [phone]      Late entry for 06/28/2023.  I saw and examined the patient.  I reviewed the fellow's note.  I agree with the findings and plan of care as documented in the fellow's note.  Any exceptions/additions are edited/noted.    Leodis Liverpool, MD

## 2023-06-30 ENCOUNTER — Other Ambulatory Visit: Payer: Self-pay

## 2023-06-30 ENCOUNTER — Ambulatory Visit (INDEPENDENT_AMBULATORY_CARE_PROVIDER_SITE_OTHER): Payer: Medicare (Managed Care) | Admitting: Student in an Organized Health Care Education/Training Program

## 2023-06-30 VITALS — BP 118/89 | HR 89 | Ht 65.0 in | Wt 213.0 lb

## 2023-06-30 DIAGNOSIS — F332 Major depressive disorder, recurrent severe without psychotic features: Secondary | ICD-10-CM

## 2023-06-30 DIAGNOSIS — F4310 Post-traumatic stress disorder, unspecified: Secondary | ICD-10-CM

## 2023-06-30 DIAGNOSIS — I2119 ST elevation (STEMI) myocardial infarction involving other coronary artery of inferior wall: Secondary | ICD-10-CM

## 2023-06-30 DIAGNOSIS — F411 Generalized anxiety disorder: Secondary | ICD-10-CM

## 2023-06-30 LAB — ECG 12-LEAD
Atrial Rate: 68 {beats}/min
Calculated P Axis: 67 degrees
Calculated R Axis: -9 degrees
Calculated T Axis: 10 degrees
PR Interval: 158 ms
QRS Duration: 66 ms
QT Interval: 384 ms
QTC Calculation: 408 ms
Ventricular rate: 68 {beats}/min

## 2023-06-30 MED ORDER — DESVENLAFAXINE SUCCINATE ER 100 MG TABLET,EXTENDED RELEASE 24 HR
100.0000 mg | ORAL_TABLET | Freq: Every day | ORAL | 2 refills | Status: DC
Start: 2023-06-30 — End: 2023-10-06
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
## 2023-07-01 ENCOUNTER — Inpatient Hospital Stay (HOSPITAL_COMMUNITY)
Admission: RE | Admit: 2023-07-01 | Discharge: 2023-07-01 | Disposition: A | Discharge status: Home / Self Care | Payer: Self-pay | Source: Ambulatory Visit | Attending: Internal Medicine

## 2023-07-01 DIAGNOSIS — R059 Cough, unspecified: Secondary | ICD-10-CM | POA: Insufficient documentation

## 2023-07-01 DIAGNOSIS — R942 Abnormal results of pulmonary function studies: Secondary | ICD-10-CM

## 2023-07-01 MED ORDER — ALBUTEROL SULFATE 2.5 MG/3 ML (0.083 %) SOLUTION FOR NEBULIZATION
2.5000 mg | INHALATION_SOLUTION | Freq: Once | RESPIRATORY_TRACT | Status: AC
Start: 2023-07-01 — End: 2023-07-01
  Administered 2023-07-01: 2.5 mg via RESPIRATORY_TRACT

## 2023-07-05 ENCOUNTER — Other Ambulatory Visit: Payer: Self-pay

## 2023-07-05 ENCOUNTER — Encounter (HOSPITAL_BASED_OUTPATIENT_CLINIC_OR_DEPARTMENT_OTHER): Payer: Self-pay

## 2023-07-05 ENCOUNTER — Ambulatory Visit: Payer: Medicare (Managed Care) | Attending: PODIATRIST-GENERAL PRACTICE

## 2023-07-05 ENCOUNTER — Encounter (HOSPITAL_BASED_OUTPATIENT_CLINIC_OR_DEPARTMENT_OTHER): Payer: Self-pay | Admitting: Physician Assistant

## 2023-07-05 VITALS — BP 130/82 | HR 98 | Temp 97.5°F | Wt 214.1 lb

## 2023-07-05 DIAGNOSIS — S8990XA Unspecified injury of unspecified lower leg, initial encounter: Secondary | ICD-10-CM | POA: Insufficient documentation

## 2023-07-06 NOTE — Progress Notes (Signed)
Department of Family Medicine   Acute Patient Visit     [name]  MRN: [id number]  DOB: 11/16/1955  Date of Service: 07/06/2023    CHIEF COMPLAINT  Chief Complaint   Patient presents with    Lumps     On leg        SUBJECTIVE  [name] is a 67 y.o. female who presents to clinic for bruising on legs. Patient was moving a grandfather clock and dropped it on her leg. She initially presented to [facility] and Xrays were taken. She states that she was told there was no fracture. Over the last few days, she has developed new bruising and swelling. She states that she was concerned for blood clot, so she wanted to get checked. She has some pain with ambulation, but she is able to walk.    Review of Systems:  Unless otherwise indicated above in the HPI, a thorough 10-point ROS was negative.        Medications:   albuterol sulfate (PROAIR HFA) 90 mcg/actuation Inhalation oral inhaler, Take 2 Puffs by inhalation Every 4 hours as needed  aspirin 81 mg Oral Tablet, Chewable, Chew 1 Tablet (81 mg total) Once a day  baclofen (LIORESAL) 5 mg Oral Tablet, Take 1 Tablet (5 mg total) by mouth Three times a day as needed  BRILINTA 90 mg Oral Tablet, Take 1 Tablet (90 mg total) by mouth Twice daily  clobetasoL (TEMOVATE) 0.05 % Ointment, Apply topically Twice daily  desvenlafaxine (PRISTIQ) 100 mg Oral Tablet Sustained Release 24 hr, Take 1 Tablet (100 mg total) by mouth Once a day  diclofenac sodium (VOLTAREN) 1 % Gel, Apply topically Four times a day - before meals and bedtime  famotidine (PEPCID) 40 mg Oral Tablet, Take 1 Tablet (40 mg total) by mouth Twice daily Before dinner  fluticasone propionate (FLONASE) 50 mcg/actuation Nasal Spray, Suspension, INSTILL 2 SPRAYS INTO EACH NOSTRIL ONCE A DAY  gabapentin (NEURONTIN) 800 mg Oral Tablet, Take 1 Tablet (800 mg total) by mouth Twice daily  metoprolol succinate (TOPROL-XL) 25 mg Oral Tablet Sustained Release 24 hr, Take 1 Tablet (25 mg total) by mouth  Once a day  montelukast (SINGULAIR) 10 mg Oral Tablet, Take 1 Tablet (10 mg total) by mouth Every evening  nitroGLYCERIN (NITROSTAT) 0.4 mg Sublingual Tablet, Sublingual, DISSOLVE 1 TABLET UNDER THE TONGUE AS NEEDED FOR CHEST PAIN EVERY 5 MINUTES UP TO 3 TIMES. IF NO RELIEF CALL 911.  ondansetron (ZOFRAN) 4 mg Oral Tablet, Take 1 Tablet (4 mg total) by mouth Every 8 hours as needed for Nausea/Vomiting  pantoprazole (PROTONIX) 40 mg Oral Tablet, Delayed Release (E.C.), Take 1 Tablet (40 mg total) by mouth Once a day Takes in am  rosuvastatin (CRESTOR) 20 mg Oral Tablet, Take 1 Tablet (20 mg total) by mouth Every evening  tiotropium bromide (SPIRIVA RESPIMAT) 2.5 mcg/actuation Inhalation oral inhaler, Take 2 Inhalations (2 Puffs total) by inhalation Once a day  umeclidinium (INCRUSE ELLIPTA) 62.5 mcg/actuation Inhalation oral inhaler, Take 1 Inhalation by inhalation Once a day    No facility-administered medications prior to visit.      Allergies:   Allergies   Allergen Reactions    Cymbalta [Duloxetine]      Rash on feet and hands    Blue Dye      Told to avoid due to patch test result by Abundio Miu    Flavoring Agent  Other Adverse Reaction (Add comment)     Patient  unsure of reaction    Lipitor [Atorvastatin] Myalgia     Muscle pain    Nickel      Blisters  "Allergic to all kinds of metal"     Codeine Itching     RASH    Mobic [Meloxicam] Nausea/ Vomiting     Nervous and shakes     Naprosyn [Naproxen] Nausea/ Vomiting    Nsaids (Non-Steroidal Anti-Inflammatory Drug) Nausea/ Vomiting         OBJECTIVE  BP 130/82   Pulse 98   Temp 36.4 C (97.5 F) (Thermal Scan)   Wt 97.1 kg (214 lb 1.1 oz)   SpO2 97%   BMI 35.62 kg/m       General: Pleasant in NAD. Clean dressed and well groomed.   Eyes: Conjunctiva clear, pupils equal and round, sclera non-icteric.  HENT: AT/NC. External ears normal. Nasal mucosa normal. Oral mucosa moist and pink. No oropharyngeal lesions  Neck: No thyromegaly or lymphadenopathy and supple,  symmetrical, trachea midline  Lungs: Normal WOB on RA. Lungs clear with no wheeze, rhonchi, or rales   Cardiovascular: RRR, no murmurs   Abdomen: Bowel sounds normal. Abd soft and round. No TTP, guarding, or mass.   Genito-urinary: Deferred  Extremities: Warm and well perfused. Ecchymosis present on anterior lower extremity w/ induration over bruising. No diffuse edema.  Skin: Skin warm and dry, No rashes, and No lesions   Neurologic: Alert and oriented. CN II-XII intact.  Lymphatics: No lymphadenopathy   Psychiatric: Normal    ASSESSMENT/PLAN  (S89.90XA) Leg injury  (primary encounter diagnosis)  Plan: Reports XR at outside facility negative for fracture, no interest in repeat imaging  Bruising appears to be improving w/ mild induration  No diffuse swelling to indicate thrombus  Advised patient to return/present to ED if diffuse swelling and worsening pain       Return if symptoms worsen or fail to improve.      Allayne Stack, MD  07/06/2023, 13:44    I discussed the patient's care with the resident prior to the patient leaving the clinic. Any significant discussion points are noted.    Berenice Bouton, MD 07/07/2023, 09:35

## 2023-07-12 ENCOUNTER — Other Ambulatory Visit: Payer: Self-pay

## 2023-07-12 ENCOUNTER — Encounter (INDEPENDENT_AMBULATORY_CARE_PROVIDER_SITE_OTHER): Payer: Self-pay | Admitting: Internal Medicine

## 2023-07-12 ENCOUNTER — Ambulatory Visit: Payer: Medicare (Managed Care) | Attending: Internal Medicine | Admitting: Internal Medicine

## 2023-07-12 VITALS — BP 122/96 | HR 58 | Temp 97.3°F | Ht 65.0 in | Wt 214.1 lb

## 2023-07-12 DIAGNOSIS — J479 Bronchiectasis, uncomplicated: Secondary | ICD-10-CM | POA: Insufficient documentation

## 2023-07-12 DIAGNOSIS — R059 Cough, unspecified: Secondary | ICD-10-CM | POA: Insufficient documentation

## 2023-07-12 MED ORDER — DOXYCYCLINE HYCLATE 100 MG CAPSULE
100.0000 mg | ORAL_CAPSULE | Freq: Two times a day (BID) | ORAL | 0 refills | Status: DC
Start: 2023-07-12 — End: 2023-08-16

## 2023-07-12 NOTE — H&P (Signed)
[facility]   Pulmonary Clinic Note   Date: 07/12/23 13:38   Name: [name]  MRN: [id number]     Primary Care Provider: Ovidio Hanger, PA-C  Reason for visit/consultation: Cough    HPI:     Information Obtained from: Patient  [name] is a 67 y.o. F with PMH CAD s/p PCI x3 w/ DES x2 to LAD and Lcx, hx of skin cancer, GERD, HLD  now presents to clinic for evaluation of chronic cough.    Patient noticed the onset of the cough in April 14, 2023 when started on lisinopril after having a PCI with stent placement. Patient's LVEF is normal w/o valvular disease.  Patient was on lisinopril for approximately 1 week prior to discontinuation.  However, after discontinuation the cough has still persisted.  Patient notes his a dry cough that occurs throughout the day without sputum production or hemoptysis.  She has had multiple negative COVID test flu test.  She has tried albuterol, Mucinex DM max, Robitussin, Delsym, and Tessalon Perles, but without relief.  Patient does have gastroesophageal reflux disease but is controlled on Protonix and Pepcid currently.  She was on Spiriva and Incruse currently, prescribed by her PCP, but these also have not helped.  She endorses dyspnea on exertion, but no worsening from her baseline.  She denies any current allergies or postnasal drip.  No fevers, chills, night sweats, or sudden unintentional weight loss.    Patient has no history of asthma, chronic obstructive pulmonary disease, childhood asthma, or hay fever.  She has no history of infection such as hx of recurrent pneumonia/TB/ fungal lung infections/ DVT/PE.    Respiratory symptoms:   - Lower: Dyspnea on exertion   Cough as described above       - Upper: sinus congestion/ post nasal drip/ throat clearing- None    mMRC Dyspnea Scale   0 - dyspnea only with strenuous activity  1 - dyspnea when hurrying or walking up slight hill  2 - walks slower than people of same age or has to stop for breath when walking at own  pace  3 - stops for breath after walking 100 yards or few minutes  4 - too dyspneic to leave house or breathless when dressing    Extrapulmonary symptoms (Bolded Positive):   - Hx of RA/ Lupus/ Scleroderma: raynaud phenomenon,  small joint synovitis,  dry eye,  dry mouth  - Sticking of food in throat. dysphagia /choking on fluid/food,  chronic skin rash  - B-symptoms:  low grade fevers, chills, NS, anorexia, weight loss, fatigue  - Sleep symptoms: snoring / witnessed apnea/ EDS     Immunization:   Pulmonary vaccines due   Topic Date Due    Pneumococcal Vaccination, Age 65+ (2 of 2 - PCV) 03/29/2014    Influenza Vaccine (1) 06/26/2023    Covid-19 Vaccine (4 - 2023-24 season) 06/26/2023     Influenza vaccine Patient plans to receive elsewhere  Pneumococcal vaccine Patient plans to receive elsewhere   COVID vaccine Patient plans to receive elsewhere       Exposure hx:  - Smoking: 80 pack year history, quit in 2006  - Occupational hx: [employer]itting [employer]  - Other exposures: cats/ dogs /birds , hay, black mold , hot tube  - No exposure to drugs like amiodarone/nitrofurantoin/MTx/ chemotherapy/radiation  - No recent travel    Review of system:   1. General: as per H&P  2. HEENT: as per H&P  3. Eyes,  no diplopia or impaired vision   4. Chest: as per HPI  5. CVS: no palpitations  6. GI: no abd pain, N/V/D   7. GU: no hematuria, hesitancy, change in urine   8. Psych: no change in mood or affect, no delusions or hallucinations   9. Skin: Bruising due to traumatic leg injury of left leg when carry dresser   10. Hem/Onc: no easy bruisability, recurrent infections.    Past Medical History:  Past Medical History:   Diagnosis Date    Anxiety     Arthritis     Awareness under anesthesia     woke during colonoscopy    Cancer (CMS HCC)     internal skin cancer    Chest pain 02/19/2016    Chronic pain     lower back    Depression     Dyspnea on exertion     GERD (gastroesophageal reflux disease)     controlled w/med    H/O hearing loss      Headache     Hearing loss     Heart murmur 1979    benign    History of coronary angioplasty with insertion of stent 05/09/2023    Hyperlipidemia LDL goal < 100 08/29/2013    denies    Migraine     MINOR CAD (coronary artery disease) 11/10/2012    no treatment other than cholesterol medications; follows regularly with K.Dalton, PA    Multinodular goiter     Muscle weakness     neck     Myocardial infarction (CMS HCC) 05/09/2023    Neck problem     spinal stenosis    Obesity     Peripheral edema     Rash     face only eczema    Rheumatic fever     in 1974 no complications    S/P thyroidectomy     Sleep apnea     Squamous cell carcinoma 02/06/2015    right side of neck    Thyroid disorder     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 Authorizing Provider   albuterol sulfate (PROAIR HFA) 90 mcg/actuation Inhalation oral inhaler Take 2 Puffs by inhalation Every 4 hours as needed 11/11/22   Louann Sjogren, PA-C   aspirin 81 mg Oral Tablet, Chewable Chew 1 Tablet (81 mg total) Once a day   Yes Provider, Historical   baclofen (LIORESAL) 5 mg Oral Tablet Take 1 Tablet (5 mg total) by mouth Three times a day as needed 03/11/23  Yes Ovidio Hanger, PA-C   BRILINTA 90 mg Oral Tablet Take 1 Tablet (90 mg total) by mouth Twice daily 04/18/23  Yes Provider, Historical   clobetasoL (TEMOVATE) 0.05 % Ointment Apply topically Twice daily 11/04/21  Yes Dalton, Kristi, PA-C   desvenlafaxine (PRISTIQ) 100 mg Oral Tablet Sustained Release 24 hr Take 1 Tablet (100 mg total) by mouth Once a day 06/30/23  Yes Leodis Liverpool, MD   Dextromethorphan-Guaifenesin [address] [facility] DM) 60-1,200 mg Oral Tablet Sustained Release 12 hr Take 1 Tablet by mouth Twice daily for 10 days 06/08/23 06/18/23  Leeanne Deed, MD   diclofenac sodium (VOLTAREN) 1 % Gel Apply topically  Four times a day -  before meals and bedtime 08/06/21  Yes Dalton, Kristi, PA-C   famotidine (PEPCID) 40 mg Oral Tablet Take 1 Tablet (40 mg total) by mouth Twice daily Before dinner 06/20/23  Yes Dalton, Kristi, PA-C   fluticasone propionate (FLONASE) 50 mcg/actuation Nasal Spray, Suspension INSTILL 2 SPRAYS INTO EACH NOSTRIL ONCE A DAY 06/14/23  Yes Merrily Brittle, MD   gabapentin (NEURONTIN) 800 mg Oral Tablet Take 1 Tablet (800 mg total) by mouth Twice daily 06/12/23  Yes Dalton, Kristi, PA-C   metoprolol succinate (TOPROL-XL) 25 mg Oral Tablet Sustained Release 24 hr Take 1 Tablet (25 mg total) by mouth Once a day 06/20/23  Yes Dalton, Kristi, PA-C   montelukast (SINGULAIR) 10 mg Oral Tablet Take 1 Tablet (10 mg total) by mouth Every evening 06/08/23  Yes Leeanne Deed, MD   nitroGLYCERIN (NITROSTAT) 0.4 mg Sublingual Tablet, Sublingual DISSOLVE 1 TABLET UNDER THE TONGUE AS NEEDED FOR CHEST PAIN EVERY 5 MINUTES UP TO 3 TIMES. IF NO RELIEF CALL 911. 04/12/23  Yes Lora Paula, MD   ondansetron [facility]) 4 mg Oral Tablet Take 1 Tablet (4 mg total) by mouth Every 8 hours as needed for Nausea/Vomiting 02/13/23  Yes Provider, Historical   pantoprazole (PROTONIX) 40 mg Oral Tablet, Delayed Release (E.C.) Take 1 Tablet (40 mg total) by mouth Once a day Takes in am 01/21/23  Yes Dalton, Kristi, PA-C   rosuvastatin (CRESTOR) 20 mg Oral Tablet Take 1 Tablet (20 mg total) by mouth Every evening 04/18/23  Yes Provider, Historical   tiotropium bromide (SPIRIVA RESPIMAT) 2.5 mcg/actuation Inhalation oral inhaler Take 2 Inhalations (2 Puffs total) by inhalation Once a day 05/20/23  Yes Dalton, Kristi, PA-C   trimethoprim-sulfamethoxazole (BACTRIM) 80-400mg  per tablet Take 1 Tablet (80 mg total) by mouth Twice daily for 5 days 06/20/23 06/25/23  Freida Busman, Silva Bandy, PA-C   umeclidinium (INCRUSE ELLIPTA) 62.5 mcg/actuation Inhalation oral inhaler Take 1 Inhalation by inhalation Once a day 05/20/23  Yes Petitte, Trisha M, APRN,FNP-BC    desvenlafaxine (PRISTIQ) 100 mg Oral Tablet Sustained Release 24 hr Take 1 Tablet (100 mg total) by mouth Once a day 03/17/23 06/30/23  Leodis Liverpool, MD   famotidine (PEPCID) 40 mg Oral Tablet Take 1 Tablet (40 mg total) by mouth Every evening Before dinner 06/08/23 06/20/23  Leeanne Deed, MD   famotidine (PEPCID) 40 mg Oral Tablet Take 1 Tablet (40 mg total) by mouth Twice daily Before dinner 06/20/23 06/20/23  Dalton, Kristi, PA-C   fluticasone propionate (FLONASE) 50 mcg/actuation Nasal Spray, Suspension ADMINISTER 2 SPRAYS INTO EACH NOSTRIL ONCE A DAY.  Patient taking differently: Administer 2 Sprays into each nostril Once a day Takes in am 08/16/22 06/14/23  Merrily Brittle, MD   losartan (COZAAR) 25 mg Oral Tablet Take 1 Tablet (25 mg total) by mouth Once a day 05/09/23 06/20/23  Ovidio Hanger, PA-C   metoprolol succinate (TOPROL-XL) 25 mg Oral Tablet Sustained Release 24 hr Take 1 Tablet (25 mg total) by mouth Once a day 06/20/23 06/20/23  Ovidio Hanger, PA-C   nitrofurantoin monohyd/m-cryst (MACROBID) 100 mg Oral Capsule Take 1 Capsule (100 mg total) by mouth Twice daily 06/14/23 06/20/23  Provider, Historical       Allergies:   Allergies   Allergen Reactions    Cymbalta [Duloxetine]      Rash on feet and hands    Blue Dye      Told to avoid due to patch test result by Abundio Miu    Flavoring Agent  Other Adverse  Reaction (Add comment)     Patient unsure of reaction    Lipitor [Atorvastatin] Myalgia     Muscle pain    Nickel      Blisters  "Allergic to all kinds of metal"     Codeine Itching     RASH    Mobic [Meloxicam] Nausea/ Vomiting     Nervous and shakes     Naprosyn [Naproxen] Nausea/ Vomiting    Nsaids (Non-Steroidal Anti-Inflammatory Drug) Nausea/ Vomiting       History:   Social History     Socioeconomic History    Marital status: Divorced     Spouse name: Not on file    Number of children: 3    Years of education: Not on file    Highest education level: Not on file   Occupational History     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     Employer: NOT EMPLOYED     Comment: occasionally   Tobacco Use    Smoking status: Former     Current packs/day: 0.00     Average packs/day: 2.0 packs/day for 40.0 years (80.0 ttl pk-yrs)     Types: Cigarettes     Start date: 01/22/1965     Quit date: 01/22/2005     Years since quitting: 18.4    Smokeless tobacco: Never   Vaping Use    Vaping status: Never Used   Substance and Sexual Activity    Alcohol use: No     Alcohol/week: 0.0 standard drinks of alcohol    Drug use: No    Sexual activity: Yes     Partners: Male   Other Topics Concern    Abuse/Domestic Violence Not Asked    Breast Self Exam Not Asked    Caffeine Concern Not Asked    Calcium intake adequate Not Asked    Computer Use Not Asked    Exercise Concern Not Asked    Helmet Use Not Asked    Seat Belt Not Asked    Special Diet Not Asked    Sunscreen used Not Asked    Uses Cane No    Uses walker No    Uses wheelchair No    Right hand dominant Yes    Left hand dominant No    Ambidextrous No    Ability to Walk 1 Flight of Steps without SOB/CP Yes    Routine Exercise No    Ability to Walk 2 Flight of Steps without SOB/CP No     Comment: SOB    Unable to Ambulate Not Asked    Total Care Not Asked    Ability To Do Own ADL's Yes    Uses Walker No    Other Activity Level Yes     Comment: housework     Uses Cane No    Drives Not Asked    Shift Work No    Unusual Sleep-Wake Schedule No   Social History Narrative    Right handed.      Social Determinants of Health     Financial Resource Strain: Not on file   Transportation Needs: Not on file   Social Connections: Not on file   Intimate Partner Violence: Not on file   Housing Stability: Not on file       Family History:   Family Medical History:       Problem Relation (Age of Onset)    Bipolar Disorder Daughter, Paternal Uncle    Breast Cancer Paternal Aunt, Paternal Aunt, Paternal  Aunt, Paternal Aunt, Paternal Aunt, Other    Cancer Paternal Aunt, Paternal Aunt, Paternal Aunt,  Paternal Aunt, Paternal Aunt, Other (102)    Congestive Heart Failure Father (50)    Coronary Artery Disease Father, Mother (67)    Diabetes Mother    Heart Attack Father, Sister    High Cholesterol Mother    Hypertension (High Blood Pressure) Mother, Brother, Sister, Sister    Kidney Disease Sister    Leukemia Paternal Uncle    Stroke Paternal Grandmother    Thyroid Disease Sister              Physical Examination:   BP (!) 122/96   Pulse 58   Temp 36.3 C (97.3 F) (Thermal Scan)   Ht 1.651 m (5\' 5" )   Wt 97.1 kg (214 lb 1.6 oz)   SpO2 99%   BMI 35.63 kg/m       Vital signs reviewed.  General: Chronically ill appearing 67 yo female in NAD  Eyes: Conjunctiva clear  Mouth: Mucous membranes moist  Neck: No cervical or supraclavicular lymphadenopathy   Lungs: CTA bilaterally, no wheezing, no use of accessory muscles of respiration. Persistent dry cough through exam  Cardiovascular: RRR without murmur  GI: soft, non-tender, bowel sounds normal and non-tender   Extremities: no cyanosis, clubbing or edema  Skin: Skin warm and dry  Neurologic: grossly normal, AO x 3  Lymphatics: No nodes appreciated    Labs: Reviewed  CBC 7/26 showed     Hgb 11.8    Eos Abs <0.10    PLT count elevated    Radiology: Reviewed independently   CXR 9/3- No acute cardiopulmonary process    CT Chest 12/21/19  Per Report  "1.  Previous described 5 mm reticular nodular opacity in the subpleural  posterior lateral left lower lobe, series 3 image 252, grossly stable  finding compared to 11/06/2018 and new compared to 2017. Probable benign.  Additional punctate 3 mm pulmonary nodules, benign-appearing.  2.  No suspicious pulmonary nodule or acute pulmonary disease."    Per our read: Possible noted bronchiectasis as seen below           PFT: Reviewed independently     07/01/2023    FVC: 3.01 L// 92 % // 3.05L // 93%   FEV1: 2.54L// 102 % // 2.58L // 103 %   Ratio: 110%  RV: 103% //TLC: 99 %  DLCO: 76%    Assessment/Plan:   [name] is a  67 y.o. F with PMH CAD s/p PCI x3 w/ DES x2 to LAD and Lcx, hx of skin cancer, GERD, HLD  now presents to clinic for evaluation of chronic cough. Concerns over patient having bronchiectasis due to previous CT in 2021.    Bronchiectasis without complication (CMS HCC)  - Due to patient's cough and CT from 2021, concern for bronchiectasis. Ordered Bronchiectasis panel as below. Ordered repeat as below to see if any changes since 2021. Ordered course of Doxycycline in case of infectious etiology.  -     doxycycline hyclate (VIBRAMYCIN) 100 mg Oral Capsule; Take 1 Capsule (100 mg total) by mouth Twice daily for 10 days  -     FLUORO ESOPHAGRAM (BA SWALLOW); Future  -     CBC/DIFF; Future  -     IMMUNOGLOBULIN G; Future  -     IMMUNOGLOBULIN A; Future  -     IMMUNOGLOBULIN M; Future  -     RHEUMATOID FACTOR, SERUM; Future  -  TOTAL IMMUNOGLOBULIN IGE; Future  -     ASPERGILLUS (GALACTOMANNAN) ANTIGEN, SERUM; Future  -     IMMUNOGLOBULIN G SUBCLASSES PANEL; Future  -     ALPHA-1-ANTITRYPSIN; Future  -     CT CHEST WO IV CONTRAST; Future    Cough, unspecified type  -     Given patient noted history of food being stuck in her throat, ordered barium swallow as below  -     FLUORO ESOPHAGRAM (BA SWALLOW); Future  -     CT CHEST WO IV CONTRAST; Future        RTC: Return to clinic in 6 weeks or earlier if needed    Orders Placed This Encounter    FLUORO ESOPHAGRAM (BA SWALLOW)    CT CHEST WO IV CONTRAST    CBC/DIFF    IMMUNOGLOBULIN G    IMMUNOGLOBULIN A    IMMUNOGLOBULIN M    RHEUMATOID FACTOR, SERUM    TOTAL IMMUNOGLOBULIN IGE    ASPERGILLUS (GALACTOMANNAN) ANTIGEN, SERUM    IMMUNOGLOBULIN G SUBCLASSES PANEL    ALPHA-1-ANTITRYPSIN    doxycycline hyclate (VIBRAMYCIN) 100 mg Oral Capsule       Neldon Newport, MD  PGY-2 Internal Medicine  *This note was partially generated using MModal Fluency Direct system, and there may be some incorrect words, spellings, and punctuation that were not noted in checking the note before  saving.        I saw and examined the patient.  I reviewed the resident's note.  I agree with the findings and plan of care as documented in the resident's note.  Any exceptions/additions are edited/noted.    Lorin Glass, MD

## 2023-07-13 ENCOUNTER — Other Ambulatory Visit: Payer: Medicare (Managed Care) | Attending: Internal Medicine | Admitting: Rheumatology

## 2023-07-13 DIAGNOSIS — J479 Bronchiectasis, uncomplicated: Secondary | ICD-10-CM | POA: Insufficient documentation

## 2023-07-13 DIAGNOSIS — R7989 Other specified abnormal findings of blood chemistry: Secondary | ICD-10-CM | POA: Insufficient documentation

## 2023-07-13 DIAGNOSIS — R300 Dysuria: Secondary | ICD-10-CM | POA: Insufficient documentation

## 2023-07-13 LAB — ALPHA-1-ANTITRYPSIN: ALPHA 1 ANTITRYPSIN: 163 mg/dL (ref 90–200)

## 2023-07-13 LAB — BASIC METABOLIC PANEL
ANION GAP: 8 mmol/L (ref 4–13)
BUN/CREA RATIO: 8 (ref 6–22)
BUN: 9 mg/dL (ref 8–25)
CALCIUM: 9.4 mg/dL (ref 8.6–10.3)
CHLORIDE: 109 mmol/L (ref 96–111)
CO2 TOTAL: 23 mmol/L (ref 23–31)
CREATININE: 1.09 mg/dL — ABNORMAL HIGH (ref 0.60–1.05)
ESTIMATED GFR - FEMALE: 56 mL/min/BSA — ABNORMAL LOW (ref >=60)
GLUCOSE: 93 mg/dL (ref 65–125)
POTASSIUM: 4.3 mmol/L (ref 3.5–5.1)
SODIUM: 140 mmol/L (ref 136–145)

## 2023-07-13 LAB — RHEUMATOID FACTOR, SERUM: RHEUMATOID FACTOR: <13 IU/mL (ref <=30)

## 2023-07-14 ENCOUNTER — Telehealth (INDEPENDENT_AMBULATORY_CARE_PROVIDER_SITE_OTHER): Payer: Self-pay | Admitting: Specialist

## 2023-07-14 ENCOUNTER — Encounter (HOSPITAL_COMMUNITY): Payer: Self-pay | Admitting: Student in an Organized Health Care Education/Training Program

## 2023-07-14 LAB — IMMUNOGLOBULIN G (IGG), SERUM: IMMUNOGLOBULIN G (IGG): 894 mg/dL (ref 610–1616)

## 2023-07-14 LAB — IMMUNOGLOBULIN A (IGA), SERUM: IMMUNOGLOBULIN A (IGA): 286 mg/dL (ref 85–499)

## 2023-07-14 LAB — IMMUNOGLOBULIN M (IGM), SERUM: IMMUNOGLOBULIN M (IGM): 146 mg/dL (ref 35–242)

## 2023-07-14 NOTE — Telephone Encounter (Signed)
CM rescheduled pts appointment because she is currently on antibiotics. Pt states they do not know what's wrong but has been sick since she has had stents put in    07/14/2023  Arminda Resides, CASE MANAGER

## 2023-07-15 ENCOUNTER — Ambulatory Visit (INDEPENDENT_AMBULATORY_CARE_PROVIDER_SITE_OTHER): Payer: Self-pay | Admitting: Rehabilitative and Restorative Service Providers"

## 2023-07-15 ENCOUNTER — Ambulatory Visit (INDEPENDENT_AMBULATORY_CARE_PROVIDER_SITE_OTHER): Payer: Self-pay | Admitting: Specialist

## 2023-07-15 LAB — IMMUNOGLOBULIN G SUBCLASSES PANEL
IMMUNOGLOBULIN G SUBCLASS 1: 523 mg/dL (ref 382–929)
IMMUNOGLOBULIN G SUBCLASS 2: 240 mg/dL — ABNORMAL LOW (ref 241–700)
IMMUNOGLOBULIN G SUBCLASS 3: 114 mg/dL (ref 22–178)
IMMUNOGLOBULIN G SUBCLASS 4: 11.4 mg/dL (ref 4.0–86.0)
IMMUNOGLOBULIN G, SERUM: 878 mg/dL (ref 600–1540)

## 2023-07-15 LAB — IGE, TOTAL IMMUNOGLOBULIN E: IgE: <2 kU/L

## 2023-07-16 LAB — ASPERGILLUS (GALACTOMANNAN) ANTIGEN, SERUM
ASPERGILLUS AG, EIA, SER: NOT DETECTED
INDEX VALUE: 0.04 (ref <0.50)

## 2023-07-18 ENCOUNTER — Ambulatory Visit: Payer: Medicare (Managed Care) | Admitting: Physician Assistant

## 2023-07-19 ENCOUNTER — Telehealth (INDEPENDENT_AMBULATORY_CARE_PROVIDER_SITE_OTHER): Payer: Self-pay | Admitting: Specialist

## 2023-07-19 NOTE — Telephone Encounter (Signed)
CM contacted PT and went over Non Fasting guidelines for upcoming procedure on 10.04.24 with Dr Marnette Burgess. PT states they are not currently taking antibiotics. PT had no further questions at this time Clinic phone number was provided to PT at this time.    07/19/2023  Arminda Resides, CASE MANAGER

## 2023-07-27 ENCOUNTER — Encounter (HOSPITAL_BASED_OUTPATIENT_CLINIC_OR_DEPARTMENT_OTHER): Payer: Self-pay | Admitting: Physician Assistant

## 2023-07-27 ENCOUNTER — Other Ambulatory Visit: Payer: Self-pay

## 2023-07-27 ENCOUNTER — Inpatient Hospital Stay (HOSPITAL_BASED_OUTPATIENT_CLINIC_OR_DEPARTMENT_OTHER)
Admission: RE | Admit: 2023-07-27 | Discharge: 2023-07-27 | Disposition: A | Discharge status: Home / Self Care | Payer: Medicare (Managed Care) | Source: Ambulatory Visit

## 2023-07-27 ENCOUNTER — Encounter (INDEPENDENT_AMBULATORY_CARE_PROVIDER_SITE_OTHER): Payer: Self-pay | Admitting: Gastroenterology

## 2023-07-27 ENCOUNTER — Ambulatory Visit: Payer: Medicare (Managed Care) | Attending: Physician Assistant | Admitting: Physician Assistant

## 2023-07-27 VITALS — BP 110/72 | HR 81 | Temp 96.7°F | Ht 65.0 in | Wt 214.5 lb

## 2023-07-27 DIAGNOSIS — S99922A Unspecified injury of left foot, initial encounter: Secondary | ICD-10-CM | POA: Insufficient documentation

## 2023-07-27 DIAGNOSIS — T148XXA Other injury of unspecified body region, initial encounter: Secondary | ICD-10-CM | POA: Insufficient documentation

## 2023-07-27 DIAGNOSIS — M79605 Pain in left leg: Secondary | ICD-10-CM | POA: Insufficient documentation

## 2023-07-27 NOTE — Progress Notes (Addendum)
Department of Family Medicine   Progress Note    [name]  MRN: [id number]  DOB: 1956-03-04  Date of Service: 07/27/2023    CHIEF COMPLAINT  Chief Complaint   Patient presents with    Cough    Leg Swelling       SUBJECTIVE  Shaquida Lemar is a 67 y.o. female who presents to clinic for left leg and foot pain/swelling.     About a month ago a grandfather clock fell on the patient's left leg and slid down her shin. Reports swelling and bruising. She has a throbbing pain from the knee down that is worse with movement. She explains that the anterior shin gets red and hot at night, but it subsides throughout the day. Leg is tender to touch. Pain is 8-9/10. X-rays were done at [facility], and she was told that there was no fracture.     A few days ago the patient had a box of metal drop on her left foot. Complains of swelling, redness, and bruising. Pain is 8-9/10. The pain worsens with movement. This pain is also described as a throbbing pain that tingles occasionally.       Review of Systems:  Positive ROS discussed in HPI, otherwise all other systems negative.      Medications:   albuterol sulfate (PROAIR HFA) 90 mcg/actuation Inhalation oral inhaler, Take 2 Puffs by inhalation Every 4 hours as needed  aspirin 81 mg Oral Tablet, Chewable, Chew 1 Tablet (81 mg total) Once a day  baclofen (LIORESAL) 5 mg Oral Tablet, Take 1 Tablet (5 mg total) by mouth Three times a day as needed  BRILINTA 90 mg Oral Tablet, Take 1 Tablet (90 mg total) by mouth Twice daily  clobetasoL (TEMOVATE) 0.05 % Ointment, Apply topically Twice daily  desvenlafaxine (PRISTIQ) 100 mg Oral Tablet Sustained Release 24 hr, Take 1 Tablet (100 mg total) by mouth Once a day  diclofenac sodium (VOLTAREN) 1 % Gel, Apply topically Four times a day - before meals and bedtime  famotidine (PEPCID) 40 mg Oral Tablet, Take 1 Tablet (40 mg total) by mouth Twice daily Before dinner  fluticasone propionate (FLONASE) 50 mcg/actuation Nasal  Spray, Suspension, INSTILL 2 SPRAYS INTO EACH NOSTRIL ONCE A DAY  gabapentin (NEURONTIN) 800 mg Oral Tablet, Take 1 Tablet (800 mg total) by mouth Twice daily  metoprolol succinate (TOPROL-XL) 25 mg Oral Tablet Sustained Release 24 hr, Take 1 Tablet (25 mg total) by mouth Once a day  montelukast (SINGULAIR) 10 mg Oral Tablet, Take 1 Tablet (10 mg total) by mouth Every evening  nitroGLYCERIN (NITROSTAT) 0.4 mg Sublingual Tablet, Sublingual, DISSOLVE 1 TABLET UNDER THE TONGUE AS NEEDED FOR CHEST PAIN EVERY 5 MINUTES UP TO 3 TIMES. IF NO RELIEF CALL 911.  ondansetron (ZOFRAN) 4 mg Oral Tablet, Take 1 Tablet (4 mg total) by mouth Every 8 hours as needed for Nausea/Vomiting  pantoprazole (PROTONIX) 40 mg Oral Tablet, Delayed Release (E.C.), Take 1 Tablet (40 mg total) by mouth Once a day Takes in am  rosuvastatin (CRESTOR) 20 mg Oral Tablet, Take 1 Tablet (20 mg total) by mouth Every evening  tiotropium bromide (SPIRIVA RESPIMAT) 2.5 mcg/actuation Inhalation oral inhaler, Take 2 Inhalations (2 Puffs total) by inhalation Once a day  umeclidinium (INCRUSE ELLIPTA) 62.5 mcg/actuation Inhalation oral inhaler, Take 1 Inhalation by inhalation Once a day    No facility-administered medications prior to visit.      Allergies:   Allergies   Allergen Reactions  Cymbalta [Duloxetine]      Rash on feet and hands    Blue Dye      Told to avoid due to patch test result by [employer]    Flavoring Agent  Other Adverse Reaction (Add comment)     Patient unsure of reaction    Lipitor [Atorvastatin] Myalgia     Muscle pain    Lisinopril  Other Adverse Reaction (Add comment)     Cough      Nickel      Blisters  "Allergic to all kinds of metal"     Codeine Itching     RASH    Mobic [Meloxicam] Nausea/ Vomiting     Nervous and shakes     Naprosyn [Naproxen] Nausea/ Vomiting    Nsaids (Non-Steroidal Anti-Inflammatory Drug) Nausea/ Vomiting         OBJECTIVE  BP 110/72   Pulse 81   Temp (!) 35.9 C (96.7 F) (Temporal)   Ht 1.651 m (5\' 5" )    Wt 97.3 kg (214 lb 8.1 oz)   SpO2 97%   BMI 35.70 kg/m       General: patient appears in mild distress  HENT: normocephalic, atraumatic, EOMs intact, nares patent, trachea midline, neck supple  Lungs: clear to auscultation bilaterally  Cardiovascular: tachycardic, no murmur  Extremities: Scattered ecchymosis are present on the anterior, lateral, and medial aspects of LLE. Faint edema and ecchymosis present on the anterior aspect of the left foot and ankle.  Skin: warm and dry, no rash  Neurologic: gait is normal, AOx3  Psychiatric: normal affect and behavior      ASSESSMENT/PLAN  (Y40.347Q) Foot trauma, left, initial encounter (primary encounter diagnosis)  Plan:   Acute concern   XR FOOT LEFT to rule out fracture  Recommend ice, elevation, and limiting weight bearing if needed    (M79.605) Left leg pain  Plan:  Subacute   Recommend increasing mobility and weight bearing as tolerated   Compression and elevation to help with swelling  Continue OTC analgesics as needed     (T14.8XXA) Hematoma  Plan:  Subacute   Recommend compression and warm compresses  Advised that this may take an extensive amount of time to resolve completely         Orders Placed This Encounter    XR FOOT LEFT         Return in about 4 weeks (around 08/24/2023) for Annual physical (40 min appt).      Arloa Koh, STUDENT PHYSICIAN ASSISTANT 07/27/2023, 14:24   I have seen and examined the patient in concert with the PA student as noted above.  The above note has been modified as necessary by me to reflect my own, personal collection/validation of the patient's HPI, patient history, physical examination, and assessment and plan.    Earlene Bjelland Dalton, PA-C

## 2023-07-27 NOTE — Nursing Note (Signed)
Your patient is scheduled for: colonoscopy  The patient is on the following medication(s) with the following plan:      Ticagrelor (Brilinta); duration to hold antiplatelet- hold x5 days; with earliest anticipated restart time TBD-day of procedure.      Note  - Earliest anticipated restart times are for planning purposes only.  Surgeon/proceduralist will confirm all restarting plans post procedure.    Please reply to this message with your preference.

## 2023-07-28 ENCOUNTER — Inpatient Hospital Stay
Admission: RE | Admit: 2023-07-28 | Discharge: 2023-07-28 | Disposition: A | Discharge status: Home / Self Care | Payer: Medicare (Managed Care) | Source: Ambulatory Visit | Attending: Internal Medicine | Admitting: Internal Medicine

## 2023-07-28 DIAGNOSIS — S99922A Unspecified injury of left foot, initial encounter: Secondary | ICD-10-CM

## 2023-07-28 DIAGNOSIS — J479 Bronchiectasis, uncomplicated: Secondary | ICD-10-CM | POA: Insufficient documentation

## 2023-07-28 DIAGNOSIS — R059 Cough, unspecified: Secondary | ICD-10-CM | POA: Insufficient documentation

## 2023-07-29 ENCOUNTER — Other Ambulatory Visit: Payer: Self-pay

## 2023-07-29 ENCOUNTER — Encounter (INDEPENDENT_AMBULATORY_CARE_PROVIDER_SITE_OTHER): Payer: Self-pay | Admitting: Specialist

## 2023-07-29 ENCOUNTER — Ambulatory Visit (INDEPENDENT_AMBULATORY_CARE_PROVIDER_SITE_OTHER): Payer: Self-pay | Admitting: Rehabilitative and Restorative Service Providers"

## 2023-07-29 ENCOUNTER — Ambulatory Visit: Payer: Medicare (Managed Care) | Attending: PHYSICIAN ASSISTANT | Admitting: Specialist

## 2023-07-29 VITALS — Temp 97.5°F | Wt 211.6 lb

## 2023-07-29 DIAGNOSIS — M25519 Pain in unspecified shoulder: Secondary | ICD-10-CM | POA: Insufficient documentation

## 2023-07-29 DIAGNOSIS — M9901 Segmental and somatic dysfunction of cervical region: Secondary | ICD-10-CM | POA: Insufficient documentation

## 2023-07-29 DIAGNOSIS — M791 Myalgia, unspecified site: Secondary | ICD-10-CM | POA: Insufficient documentation

## 2023-07-29 DIAGNOSIS — M5090 Cervical disc disorder, unspecified, unspecified cervical region: Secondary | ICD-10-CM | POA: Insufficient documentation

## 2023-07-29 DIAGNOSIS — M542 Cervicalgia: Secondary | ICD-10-CM

## 2023-07-29 NOTE — Nursing Note (Signed)
Pain and Function:     [place] Pain Rating Scale     On a scale of 0-10, during the past 24 hours, pain has interfered with you usual activity: 8     On a scale of 0-10, during the past 24 hours, pain has interfered with your sleep: 8    On a scale of 0-10, during the past 24 hours, pain has affected your mood: 7     On a scale of 0-10, during the past 24 hours, pain has contributed to your stress: 7     On a scale of 0-10, what is your overall pain Rating: 8

## 2023-07-29 NOTE — Patient Instructions (Signed)
[facility]  [address]  [place] [address]  Dept: [phone]  Dept Fax: [phone]  [phone]                                                 SPECIAL PROCEDURES                                     DISCHARGE FORM                                          [phone]      Please follow the instructions listed below for your procedures.  If you have questions concerning your procedure, you may call and leave a message.  Messages will be returned by the end of the next business day.  If you have an emergency, proceed to your local Emergency Department.      PROCEDURE: TRIGGER POINT INJECTION    Do not drive a car or operate machinery until tomorrow.  Rest today and return to normal activities tomorrow.  If you are on restricted activities by your physician, please continue to follow these.  If you are not sure, contact your physician.  If you have soreness at the injection site, the application of heat or ice may be helpful. Mild analgesics may also be used.  Steroid injections may cause temporary increase of blood sugar levels.    These instructions have been reviewed with the patient and appropriate questions have answers.  Sonia Side, RN 07/29/2023 09:16

## 2023-07-29 NOTE — Progress Notes (Signed)
[facility]  [address]  [place], [place] [address]  [phone]    Name:  [name]  MRN:  [id number]  Date:  07/29/2023  Birthday: [birth year]  Age:  67 y.o.  Gender  female    Subjective:  Chief Complaint   Patient presents with    Neck Pain     Pain Level:  Post TPI    Objective:  Palpation:  Hypertonicity in bilateral trapezius and QL    Today's Treatment:  Applied Therapeutic Massage, Trigger Point/Neuromuscular Therapy and Myofascial Release to Affected Areas.    Assessment:  Patient tolerated treatment.    Plan:  Return as needed.    Duration:  20 Min.    Primus Bravo, MT

## 2023-07-29 NOTE — Procedures (Signed)
[facility]  [address]  [facility] [address]  Operated by [facility]  Procedure Note    Name: [name] MRN:  [id number]   Date: 07/29/2023 DOB:  May 21, 1956 (67 y.o.)         PATIENT NAME: [name]   CHART NUMBER: [id number]     DATE OF BIRTH: 02-24-1956   DATE OF SERVICE: 07/29/2023         SITE OF SERVICE: [facility]  [facility]  [place] interventional pain team     PREOPERATIVE DIAGNOSIS:  Myalgia    POSTOPERATIVE DIAGNOSIS: Same    NAME OF PROCEDURE: Trigger Point Injection 4 muscle groups B C/L/S paravertebral and B Trapezii    SURGEON: Nobie Putnam, MD    ANESTHESIA: None      ESTIMATED BLOOD LOSS: None      COMPLICATIONS:  were no complications        DESCRIPTION OF PROCEDURE:       After informed consent was obtained, risks and benefits explained, questions answered, patient was placed in  position. Areas of most tenderness were palpated and marked.  The skin was prepped and draped in the usual sterile fashion using betadine x3 and chloroprep x2.  At each of the previously marked locations, 1- 1.72ml of 0.25 % Bupivacaine MPF  was injected in each trigger point after negative aspiration in a fanning manner in various planes using a g25 1.5 inch sterile needle.  Total volume injected was 12 cc of Bupivcaine.There were no complications or paresthesias.   Patient tolerated the procedure well and all needles were removed intact.       Patient was observed in recovery room in stable condition post- procedure. Vitals were obtained Q10,mins x2 and first set were stable was discharged home with family, f/u prn.    Monia Sabal, MD  07/29/2023, 11:38    Monia Sabal, MD

## 2023-08-01 ENCOUNTER — Other Ambulatory Visit: Payer: Self-pay

## 2023-08-01 ENCOUNTER — Inpatient Hospital Stay
Admission: RE | Admit: 2023-08-01 | Discharge: 2023-08-01 | Disposition: A | Discharge status: Home / Self Care | Payer: Medicare (Managed Care) | Source: Ambulatory Visit | Attending: Internal Medicine | Admitting: Internal Medicine

## 2023-08-01 ENCOUNTER — Other Ambulatory Visit (HOSPITAL_BASED_OUTPATIENT_CLINIC_OR_DEPARTMENT_OTHER): Payer: Self-pay | Admitting: Physician Assistant

## 2023-08-01 DIAGNOSIS — K449 Diaphragmatic hernia without obstruction or gangrene: Secondary | ICD-10-CM

## 2023-08-01 DIAGNOSIS — R059 Cough, unspecified: Secondary | ICD-10-CM | POA: Insufficient documentation

## 2023-08-01 DIAGNOSIS — J479 Bronchiectasis, uncomplicated: Secondary | ICD-10-CM | POA: Insufficient documentation

## 2023-08-01 MED ORDER — BARIUM SULFATE 700 MG TABLET
1.0000 | ORAL_TABLET | ORAL | Status: AC
Start: 2023-08-01 — End: 2023-08-01
  Administered 2023-08-01: 1 via ORAL

## 2023-08-01 MED ORDER — SOD BICARB-CITRIC AC-SIMETH 2.21 GRAM-1.53 GRAM/4 GRAM GRANULES EFFERV
1.0000 | GRANULES | ORAL | Status: AC
Start: 2023-08-01 — End: 2023-08-01
  Administered 2023-08-01: 1 via ORAL

## 2023-08-01 MED ORDER — BARIUM SULFATE 98 % ORAL POWDER FOR SUSPENSION
70.0000 mL | INHALATION_SUSPENSION | ORAL | Status: AC
Start: 2023-08-01 — End: 2023-08-01
  Administered 2023-08-01: 170 g via ORAL

## 2023-08-01 MED ORDER — BARIUM SULFATE 60 % (W/V) ORAL SUSPENSION
130.0000 mL | ORAL | Status: AC
Start: 2023-08-01 — End: 2023-08-01
  Administered 2023-08-01: 130 mL via ORAL

## 2023-08-02 ENCOUNTER — Ambulatory Visit (INDEPENDENT_AMBULATORY_CARE_PROVIDER_SITE_OTHER): Payer: Self-pay | Admitting: Chiropractor

## 2023-08-02 DIAGNOSIS — M5090 Cervical disc disorder, unspecified, unspecified cervical region: Secondary | ICD-10-CM

## 2023-08-02 DIAGNOSIS — M47812 Spondylosis without myelopathy or radiculopathy, cervical region: Secondary | ICD-10-CM

## 2023-08-02 DIAGNOSIS — M9901 Segmental and somatic dysfunction of cervical region: Secondary | ICD-10-CM

## 2023-08-02 DIAGNOSIS — M9903 Segmental and somatic dysfunction of lumbar region: Secondary | ICD-10-CM

## 2023-08-02 DIAGNOSIS — M791 Myalgia, unspecified site: Secondary | ICD-10-CM

## 2023-08-02 DIAGNOSIS — M9904 Segmental and somatic dysfunction of sacral region: Secondary | ICD-10-CM

## 2023-08-02 DIAGNOSIS — M9902 Segmental and somatic dysfunction of thoracic region: Secondary | ICD-10-CM

## 2023-08-02 NOTE — Progress Notes (Signed)
[facility]  [address]  [place] [address]  [phone]        Name: [name]  MRN: [id number]  Date:08/02/2023  Birthday: 1956/05/25      Chief Complaint   Patient presents with    Neck Pain    Back Pain    Myalgia         Subjective:  [name] presents for follow up care. She states that she had TPI's at the Pain [facility] last Friday and she is real sore from them. She rates her pain an 8-8.5/10 this morning. She denies having any pain, numbness/tingling, or weakness down her arms and legs, except for some intermittent tingling in her left arm.    Objective:       [place] Pain Rating Scale     On a scale of 0-10, during the past 24 hours, pain has interfered with you usual activity: 8     On a scale of 0-10, during the past 24 hours, pain has interfered with your sleep: 9    On a scale of 0-10, during the past 24 hours, pain has affected your mood: 8     On a scale of 0-10, during the past 24 hours, pain has contributed to your stress: 8     On a scale of 0-10, what is your overall pain Rating: 8    Cervical ROM: Decreased.  Lumbar ROM: Decreased.  Joint mobility restrictions: C1-C2, C7-T1, T4-T5, T5-T6, L5-S1, right upper sacroliliac  Tender points/Trigger points: Cervical paraspinals/ upper traps and lumbar paraspinals.  Hypertonicity: Cervical paraspinals/ upper traps and lumbar paraspinals.      Assessment:       ICD-10-CM    1. Segmental dysfunction of cervical region  M99.01       2. 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 Segmental dysfunction of thoracic region  M99.02       5. Segmental dysfunction of lumbar region  M99.03       6. Segmental dysfunction of sacral region  M99.04       7. Myalgia  M79.10           Plan:  Continue with care as outlined in the treatment plan:      Today's treatment:    Chiropractic manipulation of  T4-T6, L5 and the right SI with drop piece to improve joint function and mobility.   Pt tolerated today's visit well and she reported feeling a little better.    CPT Code: 95621 and  [phone].    Arlyn Leak, DC

## 2023-08-03 ENCOUNTER — Encounter (INDEPENDENT_AMBULATORY_CARE_PROVIDER_SITE_OTHER): Payer: Self-pay | Admitting: Internal Medicine

## 2023-08-03 ENCOUNTER — Other Ambulatory Visit: Payer: Self-pay

## 2023-08-03 ENCOUNTER — Ambulatory Visit: Payer: Medicare (Managed Care) | Attending: Internal Medicine | Admitting: Internal Medicine

## 2023-08-03 ENCOUNTER — Encounter (HOSPITAL_COMMUNITY): Payer: Self-pay

## 2023-08-03 VITALS — BP 144/89 | HR 72 | Temp 97.3°F | Ht 65.0 in | Wt 210.4 lb

## 2023-08-03 DIAGNOSIS — K449 Diaphragmatic hernia without obstruction or gangrene: Secondary | ICD-10-CM | POA: Insufficient documentation

## 2023-08-03 DIAGNOSIS — Z87891 Personal history of nicotine dependence: Secondary | ICD-10-CM

## 2023-08-03 DIAGNOSIS — R059 Cough, unspecified: Secondary | ICD-10-CM | POA: Insufficient documentation

## 2023-08-03 DIAGNOSIS — J479 Bronchiectasis, uncomplicated: Secondary | ICD-10-CM | POA: Insufficient documentation

## 2023-08-03 DIAGNOSIS — Z23 Encounter for immunization: Secondary | ICD-10-CM | POA: Insufficient documentation

## 2023-08-03 MED ORDER — IPRATROPIUM 0.5 MG-ALBUTEROL 3 MG (2.5 MG BASE)/3 ML NEBULIZATION SOLN
3.0000 mL | INHALATION_SOLUTION | Freq: Four times a day (QID) | RESPIRATORY_TRACT | 1 refills | Status: DC | PRN
Start: 2023-08-03 — End: 2023-12-02

## 2023-08-03 MED ORDER — PREDNISONE 5 MG TABLET
ORAL_TABLET | ORAL | 0 refills | Status: DC
Start: 2023-08-03 — End: 2023-08-18

## 2023-08-03 NOTE — Progress Notes (Signed)
PHYSICIAN OFFICE [facility]  PULMONARY, PHYSICIAN OFFICE CENTER  [address]  [place] [address]  [phone]    Pulmonary Follow up       Patient name:  [name]  MRN:  [id number]  DOB:  05-Mar-1956  DATE:  08/03/2023        [name] 1956-09-04 was seen 08/03/2023 for pulmonary follow up.    Pulmonary vaccines due   Topic Date Due    Pneumococcal Vaccination, Age 66+ (2 of 2 - PCV) 03/29/2014    Covid-19 Vaccine (4 - 2023-24 season) 06/26/2023     Influenza vaccine Ordered  Pneumococcal vaccine Ordered   COVID vaccine Declined due to COVID declined reason: vaccine hesitancy        Current Outpatient Medications   Medication Sig    albuterol sulfate (PROAIR HFA) 90 mcg/actuation Inhalation oral inhaler Take 2 Puffs by inhalation Every 4 hours as needed    aspirin 81 mg Oral Tablet, Chewable Chew 1 Tablet (81 mg total) Every morning    baclofen (LIORESAL) 5 mg Oral Tablet Take 1 Tablet (5 mg total) by mouth Three times a day as needed    BRILINTA 90 mg Oral Tablet Take 1 Tablet (90 mg total) by mouth Twice daily    clobetasoL (TEMOVATE) 0.05 % Ointment Apply topically Twice daily    desvenlafaxine (PRISTIQ) 100 mg Oral Tablet Sustained Release 24 hr Take 1 Tablet (100 mg total) by mouth Once a day (Patient taking differently: Take 1 Tablet (100 mg total) by mouth Every morning)    diclofenac sodium (VOLTAREN) 1 % Gel Apply topically Four times a day - before meals and bedtime (Patient taking differently: Apply topically Four times a day as needed)    famotidine (PEPCID) 40 mg Oral Tablet Take 1 Tablet (40 mg total) by mouth Twice daily    fluticasone propionate (FLONASE) 50 mcg/actuation Nasal Spray, Suspension INSTILL 2 SPRAYS INTO EACH NOSTRIL ONCE A DAY (Patient taking differently: 2 Sprays Every morning)    gabapentin (NEURONTIN) 800 mg Oral Tablet Take 1 Tablet (800 mg total) by mouth Twice daily    ipratropium-albuterol 0.5 mg-3 mg(2.5 mg base)/3 mL Solution for Nebulization Take  3 mL by nebulization Every 6 hours as needed for Wheezing for up to 30 days    metoprolol succinate (TOPROL-XL) 25 mg Oral Tablet Sustained Release 24 hr Take 1 Tablet (25 mg total) by mouth Once a day (Patient taking differently: Take 1 Tablet (25 mg total) by mouth Every evening)    montelukast (SINGULAIR) 10 mg Oral Tablet Take 1 Tablet (10 mg total) by mouth Every evening    mupirocin (BACTROBAN) 2 % Ointment Apply to each nostril Once a day for 30 days    nitroGLYCERIN (NITROSTAT) 0.4 mg Sublingual Tablet, Sublingual DISSOLVE 1 TABLET UNDER THE TONGUE AS NEEDED FOR CHEST PAIN EVERY 5 MINUTES UP TO 3 TIMES. IF NO RELIEF CALL 911.    ondansetron (ZOFRAN) 4 mg Oral Tablet Take 1 Tablet (4 mg total) by mouth Every 8 hours as needed for Nausea/Vomiting    pantoprazole (PROTONIX) 40 mg Oral Tablet, Delayed Release (E.C.) Take 1 Tablet (40 mg total) by mouth Once a day Takes in am (Patient taking differently: Take 1 Tablet (40 mg total) by mouth Every morning Takes in am)    rosuvastatin (CRESTOR) 20 mg Oral Tablet Take 1 Tablet (20 mg total) by mouth Every evening    solifenacin (VESICARE) 10 mg Oral Tablet Take 1 Tablet (10  mg total) by mouth Once a day    tiotropium bromide (SPIRIVA RESPIMAT) 2.5 mcg/actuation Inhalation oral inhaler Take 2 Inhalations (2 Puffs total) by inhalation Once a day (Patient taking differently: Take 2 Inhalations (2 Puffs total) by inhalation Every evening)    umeclidinium (INCRUSE ELLIPTA) 62.5 mcg/actuation Inhalation oral inhaler Take 1 Inhalation by inhalation Once a day       There are no discontinued medications.    Allergies   Allergen Reactions    Cymbalta [Duloxetine]      Rash on feet and hands    Blue Dye      Told to avoid due to patch test result by [facility]    Flavoring Agent  Other Adverse Reaction (Add comment)     Patient unsure of reaction    Lipitor [Atorvastatin] Myalgia     Muscle pain    Lisinopril  Other Adverse Reaction (Add comment)     Cough      Nickel       Blisters  "Allergic to all kinds of metal"     Codeine Itching     RASH    Mobic [Meloxicam] Nausea/ Vomiting     Nervous and shakes     Naprosyn [Naproxen] Nausea/ Vomiting    Nsaids (Non-Steroidal Anti-Inflammatory Drug) Nausea/ Vomiting       Immunization History   Administered Date(s) Administered    Covid-19 Vaccine,Pfizer-BioNTech,Purple Top,5yrs+ 01/11/2020, 02/01/2020, 10/29/2020    DIPTH,PERTUSSIS-ACEL,TETANUS >10 YRS OLD 06/04/2010    H1n1 Flu Vaccine Injection (Admin) 10/10/2008    INFLUENZA VIRUS VACCINE (ADMIN) 09/11/2001, 07/24/2012, 10/11/2013    Influenza Vaccine, 6 month-adult 09/09/2010, 09/07/2011, 07/24/2012, 10/11/2013, 08/19/2014, 08/28/2015, 06/19/2016, 07/14/2017, 07/20/2018, 07/12/2019, 06/10/2020, 08/07/2021    Influenza Vaccine, 65+ 07/16/2022, 08/03/2023    Pneumovax 03/29/2013    Shingrix - Zoster Vaccine 04/04/2019, 07/05/2019    Tetanus,Diptheria,Pertussis(BOOSTRIX) 06/04/2010, 03/12/2020          Chief Complaint:  Follow Up      Interval History:      Still coughing, did not get better. No responding to inhalers.   Mostly dry.  Fluoroscopy done, with Hiatal Hernia.  Quit smoking 2006.      Past medical history, social history, family history have been reviewed.  Any pertinent changes are documented in the electronic chart    Review of Systems-     Other than ROS in the HPI, all other systems were negative.      Physical Exam    BP (!) 144/89   Pulse 72   Temp 36.3 C (97.3 F)   Ht 1.651 m (5\' 5" )   Wt 95.4 kg (210 lb 6.4 oz)   SpO2 96% Comment: room air  BMI 35.01 kg/m      Body mass index is 35.01 kg/m.     Physical Exam  Constitutional:       Appearance: Normal appearance.   HENT:      Head: Normocephalic and atraumatic.   Cardiovascular:      Rate and Rhythm: Normal rate and regular rhythm.      Pulses: Normal pulses.      Heart sounds: Normal heart sounds. No murmur heard.     No friction rub. No gallop.   Pulmonary:      Effort: Pulmonary effort is normal. No  respiratory distress.      Breath sounds: Normal breath sounds. No stridor. No wheezing, rhonchi or rales.   Musculoskeletal:         General:  No swelling.      Cervical back: Neck supple.   Skin:     Coloration: Skin is not jaundiced or pale.   Neurological:      General: No focal deficit present.      Mental Status: She is alert.          ADDITIONAL STUDIES reviewed with patients.    All pertinent images were visualized with the patient :                 PFT  ABG    A/P:   Problem List Items Addressed This Visit    None  Visit Diagnoses       Cough, unspecified type    -  Primary    Relevant Orders    Refer to [facility] Gastroenterology/Hepatology,Physician Office Center    BRONCHOSCOPY (Completed)    Hiatal hernia        Relevant Orders    Refer to [facility] Gastroenterology/Hepatology,Physician Office Center    Bronchiectasis without complication (CMS HCC)        Relevant Orders    BRONCHOSCOPY (Completed)    Dme,Other (Requisition/Request)          Coughing, received couple of Antibiotics rounds, with no response, will offer her Bronchoscopy to more evaluate her reason for cough.    Trial of prednisone taper. Nebulized Duoneb.    Also refer to GI, has h/o Hiatal hernia?? GERD on PPI/H2 blocker.    Will give FLU shot today.  To receive her Prevnar 20 at pharmacy, as we do not have it.    RTC in 1-2 months or before.     Lorin Glass, MD  08/03/2023, 09:54      Thank your for allowing me to see [name] in follow up.  Please call with any questions.      This note has been sent to Ovidio Hanger, PA-C  the requesting physician, with findings and recommendations.

## 2023-08-04 ENCOUNTER — Encounter (HOSPITAL_COMMUNITY): Payer: Self-pay | Admitting: Gastroenterology

## 2023-08-04 ENCOUNTER — Inpatient Hospital Stay (HOSPITAL_COMMUNITY)
Admission: RE | Admit: 2023-08-04 | Discharge: 2023-08-04 | Disposition: A | Discharge status: Home / Self Care | Payer: Medicare (Managed Care) | Source: Ambulatory Visit

## 2023-08-04 ENCOUNTER — Encounter (HOSPITAL_COMMUNITY): Payer: Self-pay

## 2023-08-04 HISTORY — DX: Personal history of urinary (tract) infections: Z87.440

## 2023-08-05 DIAGNOSIS — R918 Other nonspecific abnormal finding of lung field: Secondary | ICD-10-CM

## 2023-08-05 DIAGNOSIS — J479 Bronchiectasis, uncomplicated: Secondary | ICD-10-CM

## 2023-08-08 ENCOUNTER — Ambulatory Visit: Payer: Medicare (Managed Care) | Attending: Urology | Admitting: Urology

## 2023-08-08 ENCOUNTER — Ambulatory Visit (HOSPITAL_BASED_OUTPATIENT_CLINIC_OR_DEPARTMENT_OTHER): Payer: Medicare (Managed Care) | Admitting: Ophthalmology

## 2023-08-08 ENCOUNTER — Other Ambulatory Visit: Payer: Self-pay

## 2023-08-08 ENCOUNTER — Encounter (INDEPENDENT_AMBULATORY_CARE_PROVIDER_SITE_OTHER): Payer: Self-pay | Admitting: Ophthalmology

## 2023-08-08 ENCOUNTER — Encounter (INDEPENDENT_AMBULATORY_CARE_PROVIDER_SITE_OTHER): Payer: Self-pay | Admitting: Urology

## 2023-08-08 ENCOUNTER — Inpatient Hospital Stay (HOSPITAL_COMMUNITY)
Admission: RE | Admit: 2023-08-08 | Discharge: 2023-08-08 | Disposition: A | Discharge status: Home / Self Care | Payer: Medicare (Managed Care) | Source: Ambulatory Visit

## 2023-08-08 VITALS — BP 165/70 | HR 69 | Temp 96.8°F | Ht 65.0 in | Wt 213.0 lb

## 2023-08-08 DIAGNOSIS — H02051 Trichiasis without entropian right upper eyelid: Secondary | ICD-10-CM | POA: Insufficient documentation

## 2023-08-08 DIAGNOSIS — Z961 Presence of intraocular lens: Secondary | ICD-10-CM

## 2023-08-08 DIAGNOSIS — H029 Unspecified disorder of eyelid: Secondary | ICD-10-CM | POA: Insufficient documentation

## 2023-08-08 DIAGNOSIS — Z9889 Other specified postprocedural states: Secondary | ICD-10-CM

## 2023-08-08 DIAGNOSIS — Z87440 Personal history of urinary (tract) infections: Secondary | ICD-10-CM | POA: Insufficient documentation

## 2023-08-08 DIAGNOSIS — H2512 Age-related nuclear cataract, left eye: Secondary | ICD-10-CM

## 2023-08-08 DIAGNOSIS — H02052 Trichiasis without entropian right lower eyelid: Secondary | ICD-10-CM

## 2023-08-08 DIAGNOSIS — H35341 Macular cyst, hole, or pseudohole, right eye: Secondary | ICD-10-CM

## 2023-08-08 DIAGNOSIS — N3281 Overactive bladder: Secondary | ICD-10-CM | POA: Insufficient documentation

## 2023-08-08 DIAGNOSIS — H0289 Other specified disorders of eyelid: Secondary | ICD-10-CM | POA: Insufficient documentation

## 2023-08-08 LAB — POC URINALYSIS (RESULTS)
BILIRUBIN: NEGATIVE mg/dL
GLUCOSE: NEGATIVE mg/dL
KETONES: NEGATIVE mg/dL
NITRITE: NEGATIVE
PH: 5.5 (ref 5.0–8.0)
PROTEIN: NEGATIVE mg/dL
SPECIFIC GRAVITY: 1.01 (ref 1.005–1.030)
UROBILINOGEN: 0.2 mg/dL

## 2023-08-08 MED ORDER — SOLIFENACIN 10 MG TABLET
10.0000 mg | ORAL_TABLET | Freq: Every day | ORAL | 3 refills | Status: DC
Start: 2023-08-08 — End: 2023-12-02

## 2023-08-08 NOTE — Addendum Note (Signed)
Addended by: Stark Jock on: 08/08/2023 01:15 PM     Modules accepted: Level of Service

## 2023-08-08 NOTE — Progress Notes (Signed)
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  Tried anticholinergics, little improvement.  Biggest symptom is dysuria.  Overall, she is doing ok.  Trial of Vesicare 10 mg qD.       O:  Examination:  Well-developed, well-nourished woman  in no apparent distress.  Abdomen is free of masses, tenderness or hepatosplenomegaly  CVA tenderness: none .  No suprapubic fullness.        A:  OAB, recurrent UTI     P:  Trial of Vesicare 10 mg qD.     The patient was told of three important components they can undertake to improve their voiding:  They are:  (1)  Scheduled voiding, (2) Limiting liquids prior to bedtime, and (3) Avoiding bladder irritants.  I discussed each component with the patient in detail as written below:      First, [name] was instructed to assist their voiding by voiding on a schedule of approximately every 2-3 hours even if they do not have to void.  In addition, they are instructed to double-void.  This means they should void in the bathroom and then attempt to void again a few minutes later, this may allow for elimination of some more left over urine.  This maneuver can improve bladder function.     Second,  [name] was instructed to limit liquids approximately 2 hours prior to bedtime.  This is because the increased fluid intake can lead to increased urine output overnight.  I advised the patient that is normal to void up to 2 times per night.       Finally, [name] was told about reduction of bladder irritants.  This may include maneuvers such as limiting the the intake of acid foods, spicy foods and caffeinated beverages.  In some patients, these foods/beverages can cause bladder irritation and symptoms of urgency, frequency, pelvic and urethral pain.  If such foods cause problems, they should be avoided.     Return in 6 months or sooner if issues.    Cyril Loosen, MD, MBA, FACS  Professor  Department of  Urology    Certified in Pain Management  [employer]

## 2023-08-08 NOTE — Progress Notes (Addendum)
[facility]  [address]  [place] [address]  Operated by [facility]         Patient Name: [name]  MRN#: [id number]  Birthdate: Mar 04, 1956    Date of Service: 08/08/2023    Chief Complaint    Tearing; Skin  Lesion       Referring Provider:  Self, Referral  No address on file    Ophthalmologist/Optometrist:     History of Sun Exposure: If yes:   Excessive sun burn:   History radiation to head:   History of skin cancer:        Past Ocular history:   Past Surgical History:   Procedure Laterality Date    CATARACT EXTRACTION Right 12/07/2022    CHALAZION EXCISION Left 12/22/2021    and conj lesion excision from caruncle    COLONOSCOPY N/A 07/23/2019    Performed by Clearnce Hasten, MD at [facility] OR ENDO    COLONOSCOPY N/A 01/06/2009    Performed by Jarvis Morgan, MD at [facility] OR ENDO    COLONOSCOPY WITH BIOPSY N/A 07/23/2019    Performed by Clearnce Hasten, MD at [facility] OR ENDO    COLONOSCOPY WITH POLYPECTOMY N/A 07/23/2019    Performed by Clearnce Hasten, MD at [facility] OR ENDO    CYSTOSCOPY N/A 09/24/2009    Performed by Liz Beach, MD at [facility] OR 5 NORTH    CYSTOSCOPY WITH CYSTOMETROGRAM AND URODYNAMICS N/A 05/01/2018    Performed by Avie Arenas, MD at [facility] OR 2 WEST    EXCISION LESION EYELID Left 02/17/2022    Performed by Stark Jock, MD at [facility] OR 2 WEST    EYELID RETRACTION REPAIR WITH EAR CARTILAGE GRAFT Right 08/10/2016    Performed by Damita Lack, MD at [facility] OR 2 WEST    EYELID RETRACTION REPAIR WITH EAR CARTILAGE GRAFT Bilateral 01/28/2015    Performed by Damita Lack, MD at [facility] OR 2 WEST    GASTROSCOPY Left 03/05/2010    Performed by Knox Saliva, MD at [facility] OR ENDO    GASTROSCOPY Left 12/19/2007    Performed by Florence Canner, MD at [facility] OR ENDO    GASTROSCOPY WITH BIOPSY Left 03/05/2010    Performed by Knox Saliva, MD at [facility] OR ENDO    HEMITHYROIDECTOMY Right 04/05/2019    Performed by Berton Mount, MD at [facility] OR 5 NORTH    HX ADENOIDECTOMY      HX ANKLE  FRACTURE TX  2007    left distal fibula, casted    HX APPENDECTOMY      HX CHOLECYSTECTOMY      HX COLONOSCOPY      HX CYSTOCELE REPAIR  09/24/2009    HX HAND SURGERY  2012    for Carpal tunnel : right side treated    HX HEART CATHETERIZATION      HX HYSTERECTOMY      HX OOPHORECTOMY      left ovary removed    HX PAROTIDECTOMY       "surgical clamp"     HX PARTIAL THYROIDECTOMY Right 04/05/2019    R hemithyroidectomy for nodule, final path follicular adenoma    HX TONSILLECTOMY      HX TOTAL VAGINAL HYSTERECTOMY  1979    HX WISDOM TEETH EXTRACTION      HX WRIST FRACTURE TX Left 2007    FOOSH injury    INJECTION EXCHANGE GAS Right 11/02/2021    Performed by  Lincoln Brigham, MD at [facility] OR 5 NORTH    MANOMETRY ESOPHAGEAL N/A 01/03/2008    Performed by Florence Canner, MD at [facility] OR 2W PREPOST    PARATHYROID GLAND SURGERY      PAROTIDECTOMY SUPERFICIAL WITH FACIAL NERVE DISSECTION Right 02/06/2015    Performed by Berton Mount, MD at [facility] OR 5 NORTH    PARS PLANA VITRECTOMY Right 11/02/2021    Pars plana vitrectomy, nternal limiting membrane (ILM) and epiretinal membrane (ERM) peel, Injection of 20%  sf6 gas, Subtenon's injection of Cefuroxime 50 mg and Dexamethasone 2 mg    PB REVISE ULNAR NERVE AT ELBOW Left 1979    PB UPPER GI ENDOSCOPY,BIOPSY  12/19/2007    patulous GE junction zone, erythema, nonerosive GERD    PH PROBE N/A 01/03/2008    Performed by Florence Canner, MD at [facility] OR 2W PREPOST    [facility] WITH INTRAOCULAR LENS Right 12/07/2022    Performed by Kathleen Argue, MD at [facility] OR 2 WEST    RELEASE CARPAL TUNNEL Right 11/30/2010    Performed by Armandina Gemma, MD at [facility] OR 2 WEST    REPAIR ANTERIOR AND POSTERIOR N/A 09/24/2009    Performed by Liz Beach, MD at [facility] OR 5 NORTH    REPAIR ECTROPION BILATERAL Bilateral 01/28/2015    Performed by Damita Lack, MD at [facility] OR 2 WEST    SEPTOPLASTY      SUSPENSION VAGINAL VAULT N/A 09/24/2009    Performed by Liz Beach, MD at [facility] OR 5 NORTH    VAGINECTOMY PARTIAL  N/A 09/24/2009    Performed by Liz Beach, MD at [facility] OR 5 NORTH    VITRECTOMY POSTERIOR WITH MEMBRANE PEEL Right 11/02/2021    Performed by Lincoln Brigham, MD at [facility] OR [address] [place], [place] 08/08/2023, 11:20    MD Addition to HPI:    The patient is a 67 y.o. female here with complaint of:    RPV for re-eval of trichiasis RUL and RLL. Has LLL lesion that came back after LLL lesision excision with Dr. Cyndie Chime (02/18/2023). Pt notes when they cover OD they can't see anything out of OS. Reports blurred vision OS, but denies any issues w/ lashes. Pt wears glasses.     Assessment     Impression(s):      ICD-10-CM    1. Trichiasis of right lower eyelid  H02.052 OPH EXTERNAL PHOTOS      2. Trichiasis of right upper eyelid  H02.051 OPH EXTERNAL PHOTOS      3. Lesion of left lower eyelid  H02.9 OPH EXTERNAL PHOTOS      4. Pseudophakia, right eye  Z96.1       5. Nuclear sclerosis, left  H25.12           Plan(s)/Recommendation(s):    NSC OS, PCIOL OD  No PCO  F/u with Dr. Fran Lowes or Dr. Christell Constant for mrx    Hx of BLL ear cartilage graft elevation with Dr. Jon Billings  Hx of inclusion cyst excision L  Well healing with improved lid position  Observe    Hx of R vitrectromy  RD precaution  F/u with Dr. Hayden Rasmussen      Imaging (CT/MRI) Reviewed:  I have seen and reviewed Radiology Images       Results Reviewed:         Base Eye Exam       Visual Acuity (Snellen -  Linear)         Right Left    Dist sc 20/40 +1 20/60    Dist ph sc 20/30 -2 20/40 +2      Correction: Glasses              Tonometry (Tonopen, 10:45 AM)         Right Left    Pressure 12 15              Pupils         Pupils APD    Right PERRL None    Left PERRL None              Visual Fields (Counting fingers)         Right Left     Full Full              Extraocular Movement         Right Left     Full Full              Neuro/Psych       Oriented x3: Yes    Mood/Affect: Normal                  I am scribing for, and in the presence of, Dr. Cyndie Chime for  services provided on 08/08/2023.  [address] [place], SCRIBE   [place], [place]  08/08/2023, 11:19        [name] is a 67 y.o. female who presents today for evaluation/consultation of:  HPI       Tearing    In left eye.  Associated symptoms include irritation. Additional comments: RUL and RLL trichiasis               Skin  Lesion     Additional comments: LLL lesion             Comments    Pt here for return visit for re-evaluation of Trichiasis RUL and RLL. Per pt, had 2 RUL lashes and 1 RLL lash epilated with Dr. Fran Lowes on 03/30/23. Pt states she has not noticed any lashes, right side, since June.   Per pt, she has a LLL lesion that came right back after her LLL lesion excision with Dr. Cyndie Chime (02/17/22). Pt denies any noticed color change to LLL lesion. Per pt, she has more tearing left eye. Pt not currently using any eye gtts.          Last edited by Ringer, [name], COA on 08/08/2023 10:35 AM.        ROS    Positive for: Eyes (RUL and RLL trichiasis; LLL lesion)  Negative for: Constitutional, Gastrointestinal, Neurological, Skin, Genitourinary, Musculoskeletal, HENT, Endocrine, Cardiovascular, Respiratory, Psychiatric, Allergic/Imm, Heme/Lymph  Last edited by Ringer, [name], COA on 08/08/2023 10:35 AM.         All other systems Negative    Nichole Lynn Ringer, COA  08/08/2023, 10:38

## 2023-08-09 ENCOUNTER — Ambulatory Visit: Payer: Medicare (Managed Care) | Attending: Ophthalmology | Admitting: Ophthalmology

## 2023-08-09 ENCOUNTER — Encounter (INDEPENDENT_AMBULATORY_CARE_PROVIDER_SITE_OTHER): Payer: Self-pay | Admitting: Ophthalmology

## 2023-08-09 ENCOUNTER — Other Ambulatory Visit (INDEPENDENT_AMBULATORY_CARE_PROVIDER_SITE_OTHER): Payer: Self-pay | Admitting: Family Medicine

## 2023-08-09 ENCOUNTER — Ambulatory Visit (INDEPENDENT_AMBULATORY_CARE_PROVIDER_SITE_OTHER): Payer: Self-pay | Admitting: PHYSICIAN ASSISTANT

## 2023-08-09 ENCOUNTER — Inpatient Hospital Stay (HOSPITAL_COMMUNITY)
Admission: RE | Admit: 2023-08-09 | Discharge: 2023-08-09 | Disposition: A | Discharge status: Home / Self Care | Payer: Medicare (Managed Care) | Source: Ambulatory Visit

## 2023-08-09 ENCOUNTER — Telehealth (HOSPITAL_COMMUNITY): Payer: Self-pay

## 2023-08-09 DIAGNOSIS — Z961 Presence of intraocular lens: Secondary | ICD-10-CM | POA: Insufficient documentation

## 2023-08-09 DIAGNOSIS — H538 Other visual disturbances: Secondary | ICD-10-CM | POA: Insufficient documentation

## 2023-08-09 DIAGNOSIS — Z955 Presence of coronary angioplasty implant and graft: Secondary | ICD-10-CM

## 2023-08-09 DIAGNOSIS — H2512 Age-related nuclear cataract, left eye: Secondary | ICD-10-CM | POA: Insufficient documentation

## 2023-08-09 DIAGNOSIS — Z9889 Other specified postprocedural states: Secondary | ICD-10-CM | POA: Insufficient documentation

## 2023-08-09 DIAGNOSIS — H35341 Macular cyst, hole, or pseudohole, right eye: Secondary | ICD-10-CM

## 2023-08-09 DIAGNOSIS — Z8669 Personal history of other diseases of the nervous system and sense organs: Secondary | ICD-10-CM

## 2023-08-09 NOTE — Progress Notes (Addendum)
[facility]  [address]  [place] [address]  Operated by [facility]         Patient Name: [name]  MRN#: [id number]  Birthdate: 1956-03-27    Date of Service: 08/09/2023    Chief Complaint    Follow Up -  Yearly Retinal Exam         Rickiya Maglio is a 67 y.o. female who presents today for evaluation/consultation of:  HPI    Hx: s/p PPV/FTMH repair/SF6 OD on 11/02/21, Hx of vitrectomy, NSC OD>OS    PDRTC: 1 year DFE OCT OU.  Doing well, Vision stable,D/N: OU ( Vision OS, was very Blur ? Cataracts)  Told by Dr. Cyndie Chime 08/08/2023  that cataracts need to be removed.   Having a lot tears  and Dry eye symptoms  OU  Denies pain or Discomfort: OU  Denies Flashes or Floaters: OU  Denies visual distortion changes:OU  Denies Curtains or Shadows : OU  Denies using gtts/Ung OU    Last edited by Clarita Crane, COA on 08/09/2023  9:52 AM.        ROS    Positive for: Gastrointestinal (GERD, medicated), Neurological (Neuropathy Medicated), Cardiovascular (Htn, Chol, medicated), Eyes (Hx: s/p PPV/FTMH repair/SF6 OD on 11/02/21, Hx of vitrectomy, NSC OD>OS), Respiratory, Heme/Lymph (ASA medicated)  Last edited by Clarita Crane, COA on 08/09/2023  9:52 AM.         All other systems Negative    Clarita Crane, COA  08/09/2023, 09:58      Base Eye Exam       Visual Acuity (Snellen - Linear)         Right Left    Dist sc 20/200 20/25    Dist ph sc 20/70       Correction: Glasses              Tonometry (Icare, 9:56 AM)         Right Left    Pressure 15 17              Pupils         Dark Shape React APD    Right 3.5 Round Minimal None    Left 3.5 Round Minimal None              Visual Fields (Counting fingers)         Right Left     Full Full              Extraocular Movement         Right Left     Full, Ortho Full, Ortho              Neuro/Psych       Oriented x3: Yes    Mood/Affect: Normal              Dilation       Both eyes: 1.0% Mydriacyl/2.5% Phen @ 9:57 AM                  Slit  Lamp and Fundus Exam       External Exam         Right Left    External Normal Normal, well healed lower lid ear cartilage graft              Slit Lamp Exam         Right Left    Lids/Lashes Normal 1+ Telangiectasia  Conjunctiva/Sclera 1+ Injection, Conjunctival staining temporally White and quiet    Cornea Clear, No epi defects, No scarring Clear, trace PEE    Anterior Chamber Deep and quiet Deep and quiet    Iris Round and reactive Round and reactive    Lens Posterior chamber intraocular lens 2+ NS    Anterior Vitreous Normal Normal              Fundus Exam         Right Left    Disc Normal Normal    C/D Ratio 0.35 0.3    Macula s/p peel, attached Normal    Vessels Normal Normal    Periphery Normal Normal                    MD Addition to HPI: Pt presents for 1 year f/u with a history of FTMH OD s/p PPV/FTMH repair/SF6 OD on 11/02/21. Pt reports overall stable OU, though notes vision OS appears blurred at this time. Pt also endorses a lot of tearing and dry eye symptoms at this time.         ENCOUNTER DIAGNOSES     ICD-10-CM   1. s/p PPV/FTMH repair/SF6 OD on 11/02/21  Z98.890   2. Pseudophakia of right eye  Z96.1   3. Nuclear senile cataract of left eye  H25.12     Orders Placed This Encounter   Procedures    OPH OCT BI       Ophthalmic Plan of Care:     Hx of FTMH OD  - s/p PPV/FTMH repair/SF6 OD on 11/02/21  - FTMH remains closed w/ some sub-foveal E-zone loss  - no recurrence of SRF OD on OCT today  - continue to monitor, f/u in 1 year    Follow up:    I have asked Allexus Klette to follow up 1 year DFE OCT OU          I have seen and examined the above patient. I discussed the above diagnoses listed in the assessment and the above ophthalmic plan of care with the patient and patient's family. All questions were answered. I reviewed and, when necessary, made changes to the technician/resident note, documented ophthalmology exam, chief complaint, history of present illness, allergies, review of systems, past  medical, past surgical, family and social history. I personally reviewed and interpreted all testing and/or imaging performed at this visit and agree with the resident's or fellow's interpretation. Any exceptions/additions are edited/noted in the relevant encounter fields.      I am scribing for, and in the presence of Dr. Hayden Rasmussen for services provided on 08/09/2023.  Raynald Blend, SCRIBE   [place], [place]  08/09/2023, 10:24      When a scribe documentaion is present , I attest that I personally performed the services described in this documentation, as scribed  in my presence, and it is both accurate  and complete.  When a resident or fellow documentation is present, I attest that I saw and examined the patient.  I reviewed the resident's note.  I agree with the findings and plan of care as documented in the resident's note.  Any exceptions/additions are edited/noted.  When performed, I also attest that I reviewed the image(s)  and testing(s)  and agree with the interpretation and report as documented by the resident/fellow.  Exceptions as noted.    Lincoln Brigham, MD

## 2023-08-09 NOTE — Nursing Note (Signed)
Scheduled for a bronchoscopy and BAL using moderate sedation on 08/11/2023 at 1300 with Dr.Al-Jaroushi. Spoke with patient on 10/9.No labs needed per MD-requested pre-op EKG-Dr.Anwar to order.Denies COVID diagnosis in last 7 weeks.States son will be driving.Medications reviewed-on Brilinta-per Dr.Al-Jaroushi can continue,no biopsy planned.Told will receive a call from the pre-op staff on 10/16 between 2 and 6PM to review arrival time and when to stop eating/drinking.Verbalized understanding of dates/times and instructions. Has call-back number for any questions.

## 2023-08-10 ENCOUNTER — Ambulatory Visit (INDEPENDENT_AMBULATORY_CARE_PROVIDER_SITE_OTHER): Payer: Self-pay | Admitting: Family

## 2023-08-10 ENCOUNTER — Ambulatory Visit (HOSPITAL_COMMUNITY): Payer: Self-pay

## 2023-08-11 ENCOUNTER — Encounter (HOSPITAL_COMMUNITY): Payer: Self-pay | Admitting: Internal Medicine

## 2023-08-11 ENCOUNTER — Ambulatory Visit
Admission: RE | Admit: 2023-08-11 | Discharge: 2023-08-11 | Disposition: A | Discharge status: Home / Self Care | Payer: Medicare (Managed Care) | Source: Ambulatory Visit | Attending: Internal Medicine | Admitting: Internal Medicine

## 2023-08-11 ENCOUNTER — Encounter (HOSPITAL_COMMUNITY): Payer: Medicare (Managed Care) | Admitting: Internal Medicine

## 2023-08-11 ENCOUNTER — Other Ambulatory Visit: Payer: Self-pay

## 2023-08-11 ENCOUNTER — Ambulatory Visit (HOSPITAL_COMMUNITY): Payer: Medicare (Managed Care)

## 2023-08-11 ENCOUNTER — Encounter (HOSPITAL_COMMUNITY)
Admission: RE | Disposition: A | Discharge status: Home / Self Care | Payer: Self-pay | Source: Ambulatory Visit | Attending: Internal Medicine

## 2023-08-11 DIAGNOSIS — K449 Diaphragmatic hernia without obstruction or gangrene: Secondary | ICD-10-CM | POA: Insufficient documentation

## 2023-08-11 DIAGNOSIS — J479 Bronchiectasis, uncomplicated: Secondary | ICD-10-CM | POA: Insufficient documentation

## 2023-08-11 DIAGNOSIS — R059 Cough, unspecified: Secondary | ICD-10-CM

## 2023-08-11 LAB — BAL FLUID MANUAL DIFFERENTIAL
BRONCHIAL EPITHELIAL CELL %: 72 %
BRONCHIAL EPITHELIAL CELL %: 38 %
LYMPHOCYTE %: 1 %
LYMPHOCYTE %: 14 %
MONOCYTE/MACROPHAGE %: 13 %
MONOCYTE/MACROPHAGE %: 18 %
NEUTROPHIL %: 14 %
NEUTROPHIL %: 30 %
SQUAMOUS EPITHELIAL CELLS MANUAL: 1

## 2023-08-11 LAB — BAL FLUID COUNT
Nucleated Cell Count: 59 /uL
Nucleated Cell Count: 12 /uL
RBC COUNT: 58 /uL
RBC COUNT: 15 /uL

## 2023-08-11 LAB — KOH PREP: KOH PREP: NONE SEEN

## 2023-08-11 SURGERY — LAVAGE BRONCHIAL ALVEOLAR
Anesthesia: Moderate Sedation (Nurse Monitored) | Site: Bronchus

## 2023-08-11 MED ORDER — LIDOCAINE (PF) 40 MG/ML (4 %) INJECTION SOLUTION (RT USE CONFIG)
INTRAMUSCULAR | Status: AC
Start: 2023-08-11 — End: 2023-08-11
  Filled 2023-08-11: qty 5

## 2023-08-11 MED ORDER — MIDAZOLAM (PF) 1 MG/ML INJECTION SOLUTION
Freq: Once | INTRAMUSCULAR | Status: AC | PRN
Start: 2023-08-11 — End: 2023-08-11
  Administered 2023-08-11: .5 mg via INTRAVENOUS

## 2023-08-11 MED ORDER — LIDOCAINE HCL 4 % (40 MG/ML) MUCOSAL SOLUTION
Freq: Once | Status: AC
Start: 2023-08-11 — End: 2023-08-11

## 2023-08-11 MED ORDER — FENTANYL (PF) 50 MCG/ML INJECTION SOLUTION
Freq: Once | INTRAMUSCULAR | Status: AC | PRN
Start: 2023-08-11 — End: 2023-08-11
  Administered 2023-08-11: 25 ug via INTRAVENOUS

## 2023-08-11 MED ORDER — LIDOCAINE HCL 4 % (40 MG/ML) MUCOSAL SOLUTION
Status: AC
Start: 2023-08-11 — End: 2023-08-11
  Filled 2023-08-11: qty 50

## 2023-08-11 MED ORDER — EPINEPHRINE 1 MG/ML (1 ML) INJECTION SOLUTION
INTRAMUSCULAR | Status: AC
Start: 2023-08-11 — End: 2023-08-11
  Filled 2023-08-11: qty 1

## 2023-08-11 MED ORDER — FENTANYL (PF) 50 MCG/ML INJECTION SOLUTION
INTRAMUSCULAR | Status: AC
Start: 2023-08-11 — End: 2023-08-11
  Filled 2023-08-11: qty 2

## 2023-08-11 MED ORDER — MIDAZOLAM 1 MG/ML INJECTION SOLUTION
INTRAMUSCULAR | Status: AC
Start: 2023-08-11 — End: 2023-08-11
  Filled 2023-08-11: qty 2

## 2023-08-11 MED ORDER — LIDOCAINE (PF) 10 MG/ML (1 %) INJECTION SOLUTION
INTRAMUSCULAR | Status: AC
Start: 2023-08-11 — End: 2023-08-11
  Filled 2023-08-11: qty 30

## 2023-08-11 MED ORDER — LIDOCAINE (PF) 10 MG/ML (1 %) INJECTION SOLUTION
10.0000 mL | Freq: Once | INTRAMUSCULAR | Status: AC
Start: 2023-08-11 — End: 2023-08-11
  Administered 2023-08-11: 10 mL via ENDOTRACHEOPULMONARY
  Filled 2023-08-11: qty 30

## 2023-08-11 SURGICAL SUPPLY — 10 items
DISCONTINUED NO SUB - JELLY LUB DYNALUBE BCTRST WATER SOL NGRS PKT STRL 5GM LF (MED SURG SUPPLIES) ×3 IMPLANT
DISCONTINUED USE ITEM 340506 - BOWL GENERAL PURPOSE 35OZ SOLUTION STRL DISP (MED SURG SUPPLIES) ×1 IMPLANT
DRAPE BACK TABLE 110IN X 80IN FAN FOLD HEAVY DUTY PADDED (DRAPE/PACKS/SHEETS/OR TOWEL) ×1 IMPLANT
KIT RM TURNOVER STPC CUSTOM ALLIED XL GEN SURG (CUSTOM TRAYS & PACK) ×1 IMPLANT
POSITION OR RSPBRY SWIRL 8X8.5X4IN DVN HEAD POLYUR FOAM SLOT CRDL (MED SURG SUPPLIES) ×1 IMPLANT
SPONGE GAUZE 4X4IN MDCHC COTTON 12 PLY TY 7 LF  STRL DISP (WOUND CARE SUPPLY) ×3 IMPLANT
STRAP POSITION KNEE FOAM SFT ADJ CNTCT CLSR LF (MED SURG SUPPLIES) ×1 IMPLANT
SYRINGE BD .5IN 27GA 1ML LF  STRL REG BVL DTCH NEEDLE ST TB REG WL DISP GRY (MED SURG SUPPLIES) ×3 IMPLANT
SYRINGE LL 10ML LF  STRL GRAD N-PYRG DEHP-FR PVC FREE MED DISP (MED SURG SUPPLIES) ×1 IMPLANT
TUBING SUCT CLR 20FT 9/32IN MEDIVAC NCDTV M/M CONN STRL LF (MED SURG SUPPLIES) ×1 IMPLANT

## 2023-08-11 NOTE — Nurses Notes (Addendum)
Gag reflex checked and intact. Pulmonary team notified.

## 2023-08-11 NOTE — Nurses Notes (Signed)
Pulmonary service requiring gag reflex to be checked post-op prior to discharge. Gag reflex to be checked at 1500.

## 2023-08-11 NOTE — Discharge Summary (Signed)
[facility]  DISCHARGE SUMMARY    PATIENT NAME:  [name], [name]  MRN:  [id number]  DOB:  October 17, 1956    ENCOUNTER DATE:  08/11/2023  INPATIENT ADMISSION DATE:   DISCHARGE DATE:  08/11/2023    ATTENDING PHYSICIAN: Lorin Glass, MD  SERVICE: PULMONARY  PRIMARY CARE PHYSICIAN: Ovidio Hanger, PA-C       No lay caregiver identified.    PRIMARY DISCHARGE DIAGNOSIS:    There are no hospital problems to display for this patient.    Active Non-Hospital Problems    Diagnosis Date Noted    Shoulder pain 07/29/2023    History of coronary angioplasty with insertion of stent 05/09/2023    Myocardial infarction (CMS HCC) 05/09/2023    Severe obesity (BMI 35.0-35.9 with comorbidity) (CMS HCC) 05/09/2023    Right hip pain 02/25/2021    Hand pain 02/25/2021    Cervical neck pain with evidence of disc disease 02/25/2021    Lumbar pain with radiation down right leg 02/25/2021    GAD (generalized anxiety disorder) 09/30/2020    Severe episode of recurrent major depressive disorder, 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) 09/30/2020    S/P thyroidectomy 04/05/2019    Lung nodule < 6cm on CT 07/31/2018    Pain due to neuropathy of facial nerve 07/18/2018    Neuropathy (CMS HCC) 05/04/2017    PTSD (post-traumatic stress disorder) 05/04/2017    History of rheumatic fever 02/16/2017    Multiple pulmonary nodules determined by computed tomography of lung 02/23/2016    Dysuria 02/20/2016    Chest pain 02/19/2016    Squamous cell carcinoma 02/06/2015    Mass of parotid gland, 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    Otitis media of both ears 09/02/2013    Hyperlipidemia LDL goal < 100 08/29/2013    MINOR CAD (coronary artery disease) 11/10/2012    Left knee pain 10/04/2012    Chest pain at rest 10/04/2012    Osteoarthritis, knee 09/07/2011    Carpal tunnel syndrome on right 12/15/2010    Lichen sclerosus et atrophicus of the vulva 06/04/2010    Neck pain 02/11/2010    Microscopic hematuria 10/10/2008    Multinodular goiter     Patellofemoral pain  syndrome 05/03/2008    GERD not well controlled 07/23/2004    Migraine Headaches 02/28/2001             Current Discharge Medication List        CONTINUE these medications - NO CHANGES were made during your visit.        Details   albuterol sulfate 90 mcg/actuation oral inhaler  Commonly known as: ProAir HFA   2 Puffs, Inhalation, EVERY 4 HOURS PRN  Qty: 18 g  Refills: 0     aspirin 81 mg Tablet, Chewable   81 mg, Oral, EVERY MORNING  Refills: 0     baclofen 5 mg Tablet  Commonly known as: LIORESAL   5 mg, Oral, 3 TIMES DAILY PRN  Qty: 90 Tablet  Refills: 10     Brilinta 90 mg Tablet  Generic drug: ticagrelor   90 mg, Oral, 2 TIMES DAILY  Refills: 0     clobetasoL 0.05 % Ointment  Commonly known as: TEMOVATE   Apply Topically, 2 TIMES DAILY  Qty: 60 g  Refills: 3     desvenlafaxine 100 mg Tablet Sustained Release 24 hr  Commonly known as: PRISTIQ   100 mg, Oral, DAILY  Qty: 90 Tablet  Refills: 2     diclofenac  sodium 1 % Gel  Commonly known as: VOLTAREN   Apply Topically, 4 TIMES DAILY - BEFORE MEALS AND NIGHTLY  Qty: 100 g  Refills: 2     famotidine 40 mg Tablet  Commonly known as: PEPCID   40 mg, Oral, 2 TIMES DAILY  Qty: 180 Tablet  Refills: 3     fluticasone propionate 50 mcg/actuation Spray, Suspension  Commonly known as: FLONASE   INSTILL 2 SPRAYS INTO EACH NOSTRIL ONCE A DAY  Qty: 16 g  Refills: 10     gabapentin 800 mg Tablet  Commonly known as: NEURONTIN   800 mg, Oral, 2 TIMES DAILY  Qty: 180 Tablet  Refills: 1     Incruse Ellipta 62.5 mcg/actuation oral inhaler  Generic drug: umeclidinium   1 Inhalation, Inhalation, DAILY  Qty: 3 Each  Refills: 2     ipratropium-albuteroL 0.5 mg-3 mg(2.5 mg base)/3 mL nebulizer solution  Commonly known as: DUONEB   3 mL, Nebulization, EVERY 6 HOURS PRN  Qty: 360 mL  Refills: 1     metoprolol succinate 25 mg Tablet Sustained Release 24 hr  Commonly known as: TOPROL-XL   25 mg, Oral, DAILY  Qty: 90 Tablet  Refills: 4     montelukast 10 mg Tablet  Commonly known as:  SINGULAIR   10 mg, Oral, EVERY EVENING  Qty: 30 Tablet  Refills: 3     nitroGLYCERIN 0.4 mg Tablet, Sublingual  Commonly known as: NITROSTAT   DISSOLVE 1 TABLET UNDER THE TONGUE AS NEEDED FOR CHEST PAIN EVERY 5 MINUTES UP TO 3 TIMES. IF NO RELIEF CALL 911.  Qty: 25 Tablet  Refills: 10     ondansetron 4 mg Tablet  Commonly known as: ZOFRAN   4 mg, Oral, EVERY 8 HOURS PRN  Refills: 0     pantoprazole 40 mg Tablet, Delayed Release (E.C.)  Commonly known as: PROTONIX   40 mg, Oral, DAILY, Takes in am  Qty: 90 Tablet  Refills: 3     rosuvastatin 20 mg Tablet  Commonly known as: CRESTOR   20 mg, Oral, EVERY EVENING  Refills: 0     solifenacin 10 mg Tablet  Commonly known as: VESICARE   10 mg, Oral, DAILY  Qty: 90 Tablet  Refills: 3     tiotropium bromide 2.5 mcg/actuation oral inhaler  Commonly known as: SPIRIVA RESPIMAT   2 Puffs, Inhalation, DAILY  Qty: 4 g  Refills: 5            ASK your doctor about these medications.        Details   doxycycline hyclate 100 mg Capsule  Commonly known as: VIBRAMYCIN  Ask about: Should I take this medication?   100 mg, Oral, 2 TIMES DAILY  Qty: 20 Capsule  Refills: 0     predniSONE 5 mg Tablet  Commonly known as: DELTASONE  Ask about: Should I take this medication?   Take 5 Tab (25mg ) by mouth QD x1 day. Then 2 Tab (10mg ) QD x1 day. Then 1 Tab (5mg ) QD x1 day.  Qty: 8 Tablet  Refills: 0            Discharge med list refreshed?  YES     Allergies   Allergen Reactions    Cymbalta [Duloxetine]      Rash on feet and hands    Blue Dye      Told to avoid due to patch test result by Abundio Miu    Flavoring Agent  Other  Adverse Reaction (Add comment)     Patient unsure of reaction    Lipitor [Atorvastatin] Myalgia     Muscle pain    Lisinopril  Other Adverse Reaction (Add comment)     Cough      Nickel      Blisters  "Allergic to all kinds of metal"     Codeine Itching     RASH    Mobic [Meloxicam] Nausea/ Vomiting     Nervous and shakes     Naprosyn [Naproxen] Nausea/ Vomiting    Nsaids  (Non-Steroidal Anti-Inflammatory Drug) Nausea/ Vomiting     HOSPITAL PROCEDURE(S):   Orders Placed This Encounter   Procedures    BEDSIDE  MISC PROCEDURE     Surgical/Procedural Cases on this Admission       Case IDs Date Procedure Surgeon Location Status    [phone] 08/11/23 LAVAGE BRONCHIAL ALVEOLAR Lorin Glass, MD [place] OR 2W PREPOST [facility]          REASON FOR HOSPITALIZATION AND HOSPITAL COURSE   BRIEF HPI:  This is a 67 y.o., female admitted for bronchoscopy and BAL under moderate sedation. BAL done from RML and RLL. Specimens were sent for cell count/differential, culture, gram stain, fungus culture, AFB, KOH and Bordetella pertussis PCR.     Patient tolerated procedure and sedation without complications.       TRANSITION/POST DISCHARGE CARE/PENDING TESTS/REFERRALS:   None.    CONDITION ON DISCHARGE:  A. Ambulation: Full ambulation  B. Self-care Ability: Complete  C. Cognitive Status Alert and Oriented x 3  D. Code status at discharge:       LINES/DRAINS/WOUNDS AT DISCHARGE:   Patient Lines/Drains/Airways Status       Active Line / Dialysis Catheter / Dialysis Graft / Drain / Airway / Wound       Name Placement date Placement time Site Days    Wound  Incision Right Eye 12/07/22  --  -- 247                    DISCHARGE DISPOSITION:  Home discharge  DISCHARGE INSTRUCTIONS:  Post-Discharge Follow Up Appointments       Tuesday Aug 16, 2023    Return Patient Visit with Leeanne Deed, MD at  1:15 PM      Thursday Aug 18, 2023    Return Patient Visit with Beverly Gust, PA-C at  2:00 PM      Monday Aug 22, 2023    Return Patient Visit with Arlyn Leak, DC at 11:30 AM      Monday Aug 29, 2023    Physical with Ovidio Hanger, PA-C at  1:10 PM      Tuesday Oct 04, 2023    Return Patient Visit with Nicky Pugh, MD at  2:20 PM      Friday Oct 14, 2023    New Patient Visit with Jackquline Berlin, MD at  8:00 AM      Wednesday Dec 07, 2023    Return Patient Visit with Lorin Glass, MD at 10:00 AM      Monday Feb 06, 2024    Return Patient Visit with Cyril Loosen, MD at 11:00 AM      Cardiology, [facility]  [facility], [facility]  [address] [facility]  [place] [address]  [phone] Chiropractic, [facility]  [facility], [place]  [address]  [place] [address]  [phone] ENT, [facility]  [facility] ENT, [facility]  [address]  [place] [address]  [phone]    Family Medicine, [facility], [place]  [address] [facility]  [place] [address]  [phone] Gastroenterology/Hepatology, Physician [facility], [facility]  [address] [facility]  [place] [address]  [phone] Pain Management, [facility]  [facility], [place]  [address]  [place] [address]  [phone]    Pulmonary, Physician [facility]  Physician Office [facility]  [address] [facility]  [place] [address]  [phone] Urology, Physician [facility], [facility]  [address] [facility]  [place] [address]  [phone]          No discharge procedures on file.       Pryor Curia, MD    Copies sent to Care Team         Relationship Specialty Notifications Start End    Ovidio Hanger, [place] PCP - General PHYSICIAN ASSISTANT Admissions 04/24/20     Phone: [phone] Fax: [phone]         [address] [facility] [address]    Paulo Fruit, MD PCP - Managed Care/Insurance FAMILY PRACTICE  04/29/20     Phone: [phone] Fax: [phone]         [address] [facility] [place] [address]    Lincoln Brigham, MD  OPHTHALMOLOGY Admissions 10/05/22     Phone: [phone] Fax: [phone]         [address] [facility] [address]     Cyril Loosen, MD  UROLOGY Admissions 10/05/22     Phone: [phone] Fax: [phone]         [address] [facility] [address]    Jannifer Franklin, MD  NEUROLOGICAL SURGERY Admissions 10/05/22     Phone: [phone] Fax: [phone]         [address] [facility] [address]    Leodis Liverpool, MD  PSYCHIATRY Admissions 10/05/22     Phone: [phone] Fax: [phone]         [address] [facility] [address]            Referring providers can utilize [email or web address] to access their referred [facility] patient's information.

## 2023-08-11 NOTE — Sedation Documentation (Signed)
Procedural Sedation Note    Procedure Date: 08/11/23  Procedure Requiring Sedation:   Bronchoscopy and BAL    Sedation informed consent, pre-sedation risk assessment and evaluation completed. History of previous adverse experiences with sedation/analgesia/anesthesia assessed.  Appropriate Facility and Equipment compliant.          Pre-Procedure Modified Aldrete Scoring   Activity: 2-->able to move 4 extremities voluntarily or on command  Respiration: 2-->able to breathe and cough freely  Circulation: 2-->BP within 20% of pre-anesthetic level  Consciousness: 2-->fully awake  O2 Saturation: 2-->able to maintain O2 saturation greater than 92% on room air  Dressing: 2-->dry and clean or not applicable  Pain: 2-->pain free  Ambulation: 2-->able to stand up and walk straight, on ordered bedrest, or performing at previous level of functioning  Fasting/Feeding: 2-->able to drink fluids or NPO, minimal nausea/ no vomiting  Urine Output: 2-->has voided, adequate urine output per device, or not applicable  Modified Aldrete Score: 20    Time-Out:                  Post Modified Aldrete Scoring   Assessment Scored:: Post-Procedure  Activity: 2-->able to move 4 extremities voluntarily or on command  Respiration: 2-->able to breathe and cough freely  Circulation: 2-->BP within 20% of pre-anesthetic level  Consciousness: 2-->fully awake  O2 Saturation: 1-->needs O2 inhalation to maintain O2 saturation greater than 90%  Dressing: 2-->dry and clean or not applicable  Pain: 2-->pain free  Ambulation: 2-->able to stand up and walk straight, on ordered bedrest, or performing at previous level of functioning  Fasting/Feeding: 2-->able to drink fluids or NPO, minimal nausea/ no vomiting  Urine Output: 2-->has voided, adequate urine output per device, or not applicable  Post Modified Aldrete Score: 19                        IV Access:       Sedation, Procedure and Recovery Times:  SEDATION START TIME:    Sedation Start Time:: 1401  SEDATION  DATE:  Procedure Date: 08/11/23  PROCEDURE START TIME: Procedure Start Time:: 1403  PROCEDURE END TIME: Procedure End Time:: 1412  SEDATION END/RECOVERY START TIME: Sedation End/Recovery Start Time: 1413  SEDATION RECOVERY END TIME: Recovery End Time: 1420    Received 0.5 mg vesred and 25 mg of fentanyl.    Additional Interventions:   None       Effect of administration of sedation:   Patient tolerated sedation with no immediate side effects.        Discharge Modified Aldrete Scoring   Assessment Scored:: D/C (From bronch suite to recovery)  Activity: 2-->able to move 4 extremities voluntarily or on command  Respiration: 2-->able to breathe and cough freely  Circulation: 2-->BP within 20% of pre-anesthetic level  Consciousness: 2-->fully awake  O2 Saturation: 1-->needs O2 inhalation to maintain O2 saturation greater than 90%  Dressing: 2-->dry and clean or not applicable  Pain: 2-->pain free  Ambulation: 2-->able to stand up and walk straight, on ordered bedrest, or performing at previous level of functioning  Fasting/Feeding: 2-->able to drink fluids or NPO, minimal nausea/ no vomiting  Urine Output: 2-->has voided, adequate urine output per device, or not applicable  D/C Modified Aldrete Score: 19  Patient Returned to Pre-Procedure Baseline?: Yes   Outcome Met?: Yes  Instructions Given to:: Patient Krystal Eaton)         The patient was continuously monitored throughout the procedure and in recovery. I was in attendance and  supervised the sedation (during the start and stop times listed above) and remained immediately available until the patient returned to pre-procedure baseline.      See Sedation Documentation Timeline Report for Additional Information in Chart Review or Summary      Supervised by Dr. Chip Boer, MD  Fellow  PCCM    =================================    The patient was continuously monitored throughout the procedure and in recovery. I was in attendance and supervised the  sedation (during the start and stop times listed above) and remained immediately available until the patient returned to pre-procedure baseline.

## 2023-08-11 NOTE — Procedures (Signed)
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 A surgical time-out was performed to confirm correct patient and procedure. Conscious sedation and sterile conditions were maintained for the procedure. The patient underwent fiberoptic bronchoscopy through the orotracheal route. The upper airways above the vocal cords at the hypopharynx levels showed erythema and granulations.    Vocal cords were visualized and appeared to abduct normally. The carina was sharp with no tracheomalacia observed. All airways were inspected to the level of segmental bronchi and were patent. Airways/Anatomy appeared Normal. No endobronchial lesions were visualized. A small amount of mucoid secretions were encountered in right and left main stems. These were aspirated as completely as possible. Bronchoalveolar lavage was performed in RML and RLL. No active bleeding was observed.    Specimen was collected from RML and RLL and sent for cell count/differential, culture, gram stain, fungus culture, AFB, KOH and Bordetella pertussis PCR.    Patient tolerated procedure without complications.       Supervised by Dr. Chip Boer, MD  Fellow  PCCM      ================  I was present and supervised/observed the entire procedure.  Lorin Glass, MD

## 2023-08-11 NOTE — Discharge Instructions (Signed)
SURGICAL DISCHARGE INSTRUCTIONS     Dr. Lorin Glass, MD  performed your LAVAGE BRONCHIAL ALVEOLAR today at the [facility]    [facility]:  Monday through Friday from 6 a.m. - 7 p.m.: [phone]  Between 7 p.m. - 6 a.m., weekends and holidays:  Call [facility] at [phone] or [phone].    PLEASE SEE WRITTEN HANDOUTS AS DISCUSSED BY YOUR NURSE:      SIGNS AND SYMPTOMS OF A WOUND / INCISION INFECTION   Be sure to watch for the following:  Increase in redness or red streaks near or around the wound or incision.  Increase in pain that is intense or severe and cannot be relieved by the pain medication that your doctor has given you.  Increase in swelling that cannot be relieved by elevation of a body part, or by applying ice, if permitted.  Increase in drainage, or if yellow / green in color and smells bad. This could be on a dressing or a cast.  Increase in fever for longer than 24 hours, or an increase that is higher than 101 degrees Fahrenheit (normal body temperature is 98 degrees Fahrenheit). The incision may feel warm to the touch.    **CALL YOUR DOCTOR IF ONE OR MORE OF THESE SIGNS / SYMPTOMS SHOULD OCCUR.    ANESTHESIA INFORMATION   ANESTHESIA -- ADULT PATIENTS:  You have received intravenous sedation / general anesthesia, and you may feel drowsy and light-headed for several hours. You may even experience some forgetfulness of the procedure. DO NOT DRIVE A MOTOR VEHICLE or perform any activity requiring complete alertness or coordination until you feel fully awake in about 24-48 hours. Do not drink alcoholic beverages for at least 24 hours. Do not stay alone, you must have a responsible adult available to be with you. You may also experience a dry mouth or nausea for 24 hours. This is a normal side effect and will disappear as the effects of the medication wear off.    REMEMBER   If you experience any difficulty breathing, chest pain, bleeding that you feel is  excessive, persistent nausea or vomiting or for any other concerns:  Call your physician Dr.Al-Jaroushi at [phone] or [phone]. You may also ask to have the PULMONARY doctor on call paged. They are available to you 24 hours a day.    SPECIAL INSTRUCTIONS / COMMENTS   See Attachments.     FOLLOW-UP APPOINTMENTS   Please call patient services at [phone] or [phone] to schedule a date / time of return. They are open Monday - Friday from 7:30 am - 5:00 pm.

## 2023-08-11 NOTE — H&P (Signed)
[facility]       H&P UPDATE FORM                                                                                    Patient's Name: [name] 67 y.o. female  Date of Admission:  08/11/2023  Patient's Date of Birth: 05/22/1956    08/11/2023       H & P updated the day of the procedure.  1.  H&P completed within 30 days of surgical procedure  and has been reviewed within 24 hours of the surgery, the patient has been examined, and no change has occured in the patients condition since the H&P was completed.       Change in medications: No     Patient's last menstrual period was No LMP recorded. Patient has had a hysterectomy..      Comments:     2.  Patient continues to be appropriate candidate for planned surgical procedure. YES      Pryor Curia, MD

## 2023-08-13 DIAGNOSIS — R9431 Abnormal electrocardiogram [ECG] [EKG]: Secondary | ICD-10-CM

## 2023-08-13 DIAGNOSIS — R001 Bradycardia, unspecified: Secondary | ICD-10-CM

## 2023-08-13 DIAGNOSIS — I252 Old myocardial infarction: Secondary | ICD-10-CM

## 2023-08-13 LAB — ECG 12-LEAD
Atrial Rate: 55 {beats}/min
Calculated P Axis: 32 degrees
Calculated R Axis: -22 degrees
Calculated T Axis: -10 degrees
PR Interval: 162 ms
QRS Duration: 80 ms
QT Interval: 454 ms
QTC Calculation: 434 ms
Ventricular rate: 55 {beats}/min

## 2023-08-13 LAB — BRONCHOALVEOLAR LAVAGE (BAL) QUANTITATIVE CULTURE/GRAM STAIN
GRAM STAIN: NONE SEEN
GRAM STAIN: NONE SEEN
GRAM STAIN: 16000
GRAM STAIN: 64000
GRAM STAIN: NONE SEEN
GRAM STAIN: NONE SEEN

## 2023-08-16 ENCOUNTER — Encounter (HOSPITAL_BASED_OUTPATIENT_CLINIC_OR_DEPARTMENT_OTHER): Payer: Self-pay

## 2023-08-16 ENCOUNTER — Other Ambulatory Visit (HOSPITAL_BASED_OUTPATIENT_CLINIC_OR_DEPARTMENT_OTHER): Payer: Medicare (Managed Care)

## 2023-08-16 ENCOUNTER — Inpatient Hospital Stay (HOSPITAL_BASED_OUTPATIENT_CLINIC_OR_DEPARTMENT_OTHER)
Admission: RE | Admit: 2023-08-16 | Discharge: 2023-08-16 | Disposition: A | Discharge status: Home / Self Care | Payer: Medicare (Managed Care) | Source: Ambulatory Visit

## 2023-08-16 ENCOUNTER — Ambulatory Visit (INDEPENDENT_AMBULATORY_CARE_PROVIDER_SITE_OTHER): Payer: Medicare (Managed Care) | Admitting: OTOLARYNGOLOGY

## 2023-08-16 ENCOUNTER — Encounter (INDEPENDENT_AMBULATORY_CARE_PROVIDER_SITE_OTHER): Payer: Self-pay | Admitting: OTOLARYNGOLOGY

## 2023-08-16 ENCOUNTER — Ambulatory Visit: Payer: Medicare (Managed Care) | Attending: Family Medicine

## 2023-08-16 ENCOUNTER — Other Ambulatory Visit: Payer: Self-pay

## 2023-08-16 VITALS — BP 116/66 | HR 76 | Temp 97.4°F | Wt 211.4 lb

## 2023-08-16 VITALS — BP 116/66 | HR 81 | Temp 97.5°F | Ht 65.0 in | Wt 211.2 lb

## 2023-08-16 DIAGNOSIS — R040 Epistaxis: Secondary | ICD-10-CM

## 2023-08-16 DIAGNOSIS — W19XXXA Unspecified fall, initial encounter: Secondary | ICD-10-CM

## 2023-08-16 DIAGNOSIS — J3089 Other allergic rhinitis: Secondary | ICD-10-CM

## 2023-08-16 DIAGNOSIS — Z7901 Long term (current) use of anticoagulants: Secondary | ICD-10-CM

## 2023-08-16 DIAGNOSIS — Y9301 Activity, walking, marching and hiking: Secondary | ICD-10-CM

## 2023-08-16 DIAGNOSIS — K219 Gastro-esophageal reflux disease without esophagitis: Secondary | ICD-10-CM

## 2023-08-16 DIAGNOSIS — W108XXA Fall (on) (from) other stairs and steps, initial encounter: Secondary | ICD-10-CM

## 2023-08-16 DIAGNOSIS — J342 Deviated nasal septum: Secondary | ICD-10-CM

## 2023-08-16 DIAGNOSIS — R053 Chronic cough: Secondary | ICD-10-CM

## 2023-08-16 DIAGNOSIS — M25571 Pain in right ankle and joints of right foot: Secondary | ICD-10-CM | POA: Insufficient documentation

## 2023-08-16 DIAGNOSIS — M25512 Pain in left shoulder: Secondary | ICD-10-CM | POA: Insufficient documentation

## 2023-08-16 LAB — ARUP MISC INT TEST 3-FROZEN

## 2023-08-16 MED ORDER — LIDOCAINE 4% AND PHENYLEPHRINE 1% NASAL SOLUTION
5.0000 [drp] | Freq: Once | NASAL | Status: AC
Start: 2023-08-16 — End: 2023-08-16
  Administered 2023-08-16: 6 via NASAL

## 2023-08-16 MED ORDER — MUPIROCIN 2 % TOPICAL OINTMENT
TOPICAL_OINTMENT | Freq: Every day | CUTANEOUS | 1 refills | Status: AC
Start: 2023-08-16 — End: 2023-09-15

## 2023-08-16 NOTE — Progress Notes (Signed)
FAMILY MEDICINE, [place] [facility]  [address]  [place] [address]  Operated by [facility]  Return Patient Visit    Name: [name] MRN:  [id number]   Date: 08/16/2023 DOB:  [birth year] (67 y.o.)   PCP: Ovidio Hanger, PA-C    Chief Complaint  Chief Complaint   Patient presents with    Fall     Subjective   [name] is a 67 y.o. female who presents to clinic for return monitoring.     Sunday was walking up steps, she got to the top and opened up the screen door but slipped. She fell backwards down the 2 steps onto the concrete slab. She doesn't know what hit first, doesn't know if she hit her head. Was able to bare weight and walk in to clinic today. No headache, vision changes, N/V.     Medication List  albuterol sulfate (PROAIR HFA) 90 mcg/actuation Inhalation oral inhaler, Take 2 Puffs by inhalation Every 4 hours as needed  aspirin 81 mg Oral Tablet, Chewable, Chew 1 Tablet (81 mg total) Every morning  baclofen (LIORESAL) 5 mg Oral Tablet, Take 1 Tablet (5 mg total) by mouth Three times a day as needed  BRILINTA 90 mg Oral Tablet, Take 1 Tablet (90 mg total) by mouth Twice daily  clobetasoL (TEMOVATE) 0.05 % Ointment, Apply topically Twice daily  desvenlafaxine (PRISTIQ) 100 mg Oral Tablet Sustained Release 24 hr, Take 1 Tablet (100 mg total) by mouth Once a day (Patient taking differently: Take 1 Tablet (100 mg total) by mouth Every morning)  diclofenac sodium (VOLTAREN) 1 % Gel, Apply topically Four times a day - before meals and bedtime (Patient taking differently: Apply topically Four times a day as needed)  famotidine (PEPCID) 40 mg Oral Tablet, Take 1 Tablet (40 mg total) by mouth Twice daily  fluticasone propionate (FLONASE) 50 mcg/actuation Nasal Spray, Suspension, INSTILL 2 SPRAYS INTO EACH NOSTRIL ONCE A DAY (Patient taking differently: 2 Sprays Every morning)  gabapentin (NEURONTIN) 800 mg Oral Tablet, Take 1 Tablet (800 mg total) by mouth Twice  daily  ipratropium-albuterol 0.5 mg-3 mg(2.5 mg base)/3 mL Solution for Nebulization, Take 3 mL by nebulization Every 6 hours as needed for Wheezing for up to 30 days  metoprolol succinate (TOPROL-XL) 25 mg Oral Tablet Sustained Release 24 hr, Take 1 Tablet (25 mg total) by mouth Once a day (Patient taking differently: Take 1 Tablet (25 mg total) by mouth Every evening)  montelukast (SINGULAIR) 10 mg Oral Tablet, Take 1 Tablet (10 mg total) by mouth Every evening  mupirocin (BACTROBAN) 2 % Ointment, Apply to each nostril Once a day for 30 days  nitroGLYCERIN (NITROSTAT) 0.4 mg Sublingual Tablet, Sublingual, DISSOLVE 1 TABLET UNDER THE TONGUE AS NEEDED FOR CHEST PAIN EVERY 5 MINUTES UP TO 3 TIMES. IF NO RELIEF CALL 911.  ondansetron (ZOFRAN) 4 mg Oral Tablet, Take 1 Tablet (4 mg total) by mouth Every 8 hours as needed for Nausea/Vomiting  pantoprazole (PROTONIX) 40 mg Oral Tablet, Delayed Release (E.C.), Take 1 Tablet (40 mg total) by mouth Once a day Takes in am (Patient taking differently: Take 1 Tablet (40 mg total) by mouth Every morning Takes in am)  rosuvastatin (CRESTOR) 20 mg Oral Tablet, Take 1 Tablet (20 mg total) by mouth Every evening  solifenacin (VESICARE) 10 mg Oral Tablet, Take 1 Tablet (10 mg total) by mouth Once a day  tiotropium bromide (SPIRIVA RESPIMAT) 2.5 mcg/actuation Inhalation oral inhaler, Take 2  Inhalations (2 Puffs total) by inhalation Once a day (Patient taking differently: Take 2 Inhalations (2 Puffs total) by inhalation Every evening)  umeclidinium (INCRUSE ELLIPTA) 62.5 mcg/actuation Inhalation oral inhaler, Take 1 Inhalation by inhalation Once a day  doxycycline hyclate (VIBRAMYCIN) 100 mg Oral Capsule, Take 1 Capsule (100 mg total) by mouth Twice daily for 10 days (Patient not taking: Reported on 08/11/2023)    No facility-administered medications prior to visit.    Objective   BP 116/66   Pulse 76   Temp 36.3 C (97.4 F) (Thermal Scan)   Wt 95.9 kg (211 lb 6.7 oz)   SpO2 96%    BMI 35.18 kg/m     General:  appears in good health, comfortable  Eyes:  Conjunctiva clear., Pupils equal and round.   HENT:  Head atraumatic and normocephalic  Lungs:  Breathing nonlabored  Cardiovascular:  regular rate and rhythm on the monitor  Abdomen:  non-distended  Extremities:  No cyanosis or deformity; L shoulder ecchymosis on posterior surface // right ankle mild swelling; exquisite tenderness along joint line and dorsum of the foot    Skin:  No rashes and No lesions  Neurologic:  Grossly normal. Alert and oriented x3  Psychiatric:  Normal  Musculoskeletal:  moving extremities equally       Assessment & Plan    [name] is a 67 y.o. female who was seen today for:    (W19.XXXA) Fall from standing  (primary encounter diagnosis)  Plan: XR FOOT RIGHT, XR SHOULDER LEFT  (M25.512) Left shoulder pain  (M25.571) Right ankle pain  Plan:   - acute; stable  - discussed getting imaging to rule out any bony abnormality   - can continue her home medications   - trial tylenol for pain and inflammation   - if pain persists will pursue PT   - if any bony abnormality will send to appropriate physician for evaluation   - Rest, ice, compression and elevation       Return if symptoms worsen or fail to improve.    Napoleon Form, MD    I discussed the patient's care with the Resident prior to the patient leaving the clinic. Any significant discussion points are noted .    Leda Roys, DO

## 2023-08-16 NOTE — Progress Notes (Addendum)
[facility]  DEPARTMENT OF OTOLARYNGOLOGY - HEAD AND NECK SURGERY  CLINIC H&P    Name: [name], 67 y.o. female  MRN: [id number]  Date of Birth: 05-20-1956  Date of Service: 08/16/2023    Chief Complaint:    Chief Complaint   Patient presents with    Cough       History of Present Illness: [name] is a 67 y.o. female here today to follow up on throat and nose problems. Was seen 2 months ago for same problem chronic cough and throat and nose problem.  She is on Prilosec and was started on Pepcid before dinner. Had some improvement but continue to have problem. She also was started on Singulair. She also uses Flonase. Gives her intermittent nose bleed. She does have many comorbidities.     On 06/08/2023: She was here for cough and throat problem. In June had heart attack, for which she had multiple stents put via catheter with cardiac cath. Then she began having severe nonstop coughing. Her symptoms became worse shortly after that and she had to be admitted in the hospital. Workup with multiple chest x-rays, discontinuation of Lisinopril, and different over-the-counter medication without any help. The patient has long lasting history of GERD and she is on Pantoprazole 40 mg in the morning before food. She also has long lasting allergies and she takes Care[employer] adviser and uses Flonase. She has been also on inhaler. The cough is debilitating, not improving. She denies any productive cough, just dry. She was also on 2 different antibiotics for this without improvement.         Past Medical History:  Past Medical History:   Diagnosis Date    Anxiety     Arthritis     Awareness under anesthesia     woke during colonoscopy    Cancer (CMS HCC)     internal skin cancer    Chest pain 02/19/2016    Chronic pain     lower back    Depression     Dyspnea on exertion     GERD (gastroesophageal reflux disease)     controlled w/med    H/O hearing loss     b/l, no aids    H/O urinary tract infection     Sept  2024- resolved    Headache     Hearing loss     Heart murmur 1979    benign    History of coronary angioplasty with insertion of stent 05/09/2023    Hyperlipidemia LDL goal < 100 08/29/2013    denies    Migraine     MINOR CAD (coronary artery disease) 11/10/2012    no treatment other than cholesterol medications; follows regularly with K.Dalton, PA    Multinodular goiter     Muscle weakness     neck     Myocardial infarction (CMS [facility]) 05/09/2023    04/18/23, sees [facility]    Neck problem     spinal stenosis    Obesity     Peripheral edema     Rash     eczema    Rheumatic fever     in 1974 no complications    S/P thyroidectomy     Shortness of breath     DOE    Sleep apnea     Squamous cell carcinoma 02/06/2015    right side of neck    Thyroid disorder     s/p part thyroidectomy    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    Tinnitus     Vaginal prolapse 2012    surgery improved significantly    Wears glasses          Past Surgical History:  Past Surgical History:   Procedure Laterality Date    Cataract extraction Right 12/07/2022    Chalazion excision Left 12/22/2021    Colonoscopy  01/06/2009    Gastroscopy  03/05/2010    Gastroscopy with biopsy  03/05/2010    Hx adenoidectomy      Hx ankle fracture tx  2007    Hx appendectomy      Hx cholecystectomy      Hx colonoscopy      Hx cystocele repair  09/24/2009    Hx hand surgery  2012    Hx heart catheterization      Hx hysterectomy      Hx oophorectomy      Hx parotidectomy       Hx partial thyroidectomy Right 04/05/2019    Hx tonsillectomy      Hx total vaginal hysterectomy  1979    Hx wisdom teeth extraction      Hx wrist fracture tx Left 2007    Parathyroid gland surgery      Pars plana vitrectomy Right 11/02/2021    Pb revise ulnar nerve at elbow Left 1979    Pb upper gi endoscopy,biopsy  12/19/2007    Septoplasty       Medications:  Outpatient Medications Marked as Taking for the 08/16/23 encounter (Office Visit) with Leeanne Deed, MD   Medication Sig     albuterol sulfate (PROAIR HFA) 90 mcg/actuation Inhalation oral inhaler Take 2 Puffs by inhalation Every 4 hours as needed    aspirin 81 mg Oral Tablet, Chewable Chew 1 Tablet (81 mg total) Every morning    BRILINTA 90 mg Oral Tablet Take 1 Tablet (90 mg total) by mouth Twice daily    clobetasoL (TEMOVATE) 0.05 % Ointment Apply topically Twice daily    desvenlafaxine (PRISTIQ) 100 mg Oral Tablet Sustained Release 24 hr Take 1 Tablet (100 mg total) by mouth Once a day (Patient taking differently: Take 1 Tablet (100 mg total) by mouth Every morning)    diclofenac sodium (VOLTAREN) 1 % Gel Apply topically Four times a day - before meals and bedtime (Patient taking differently: Apply topically Four times a day as needed)    famotidine (PEPCID) 40 mg Oral Tablet Take 1 Tablet (40 mg total) by mouth Twice daily    fluticasone propionate (FLONASE) 50 mcg/actuation Nasal Spray, Suspension INSTILL 2 SPRAYS INTO EACH NOSTRIL ONCE A DAY (Patient taking differently: 2 Sprays Every morning)    gabapentin (NEURONTIN) 800 mg Oral Tablet Take 1 Tablet (800 mg total) by mouth Twice daily    ipratropium-albuterol 0.5 mg-3 mg(2.5 mg base)/3 mL Solution for Nebulization Take 3 mL by nebulization Every 6 hours as needed for Wheezing for up to 30 days    metoprolol succinate (TOPROL-XL) 25 mg Oral Tablet Sustained Release 24 hr Take 1 Tablet (25 mg total) by mouth Once a day (Patient taking differently: Take 1 Tablet (25 mg total) by mouth Every evening)    montelukast (SINGULAIR) 10 mg Oral Tablet Take 1 Tablet (10 mg total) by mouth Every evening    mupirocin (BACTROBAN) 2 % Ointment Apply to each nostril Once a day for 30 days    nitroGLYCERIN (NITROSTAT) 0.4 mg Sublingual Tablet, Sublingual DISSOLVE 1 TABLET UNDER THE TONGUE AS NEEDED  FOR CHEST PAIN EVERY 5 MINUTES UP TO 3 TIMES. IF NO RELIEF CALL 911.    ondansetron (ZOFRAN) 4 mg Oral Tablet Take 1 Tablet (4 mg total) by mouth Every 8 hours as needed for Nausea/Vomiting     pantoprazole (PROTONIX) 40 mg Oral Tablet, Delayed Release (E.C.) Take 1 Tablet (40 mg total) by mouth Once a day Takes in am (Patient taking differently: Take 1 Tablet (40 mg total) by mouth Every morning Takes in am)    rosuvastatin (CRESTOR) 20 mg Oral Tablet Take 1 Tablet (20 mg total) by mouth Every evening    solifenacin (VESICARE) 10 mg Oral Tablet Take 1 Tablet (10 mg total) by mouth Once a day    tiotropium bromide (SPIRIVA RESPIMAT) 2.5 mcg/actuation Inhalation oral inhaler Take 2 Inhalations (2 Puffs total) by inhalation Once a day (Patient taking differently: Take 2 Inhalations (2 Puffs total) by inhalation Every evening)    umeclidinium (INCRUSE ELLIPTA) 62.5 mcg/actuation Inhalation oral inhaler Take 1 Inhalation by inhalation Once a day      Family History:  Family Medical History:       Problem Relation (Age of Onset)    Bipolar Disorder Daughter, Paternal Uncle    Breast Cancer Paternal Aunt, Paternal Aunt, Paternal Aunt, Paternal Aunt, Paternal Aunt, Other    Cancer Paternal Aunt, Paternal Aunt, Paternal Aunt, Paternal Aunt, Paternal Aunt, Other (88)    Congestive Heart Failure Father (50)    Coronary Artery Disease Father, Mother (45)    Diabetes Mother    Heart Attack Father, Sister    High Cholesterol Mother    Hypertension (High Blood Pressure) Mother, Brother, Sister, Sister    Kidney Disease Sister    Leukemia Paternal Uncle    Stroke Paternal Grandmother    Thyroid Disease Sister            Social History:  Social History     Occupational History    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     Employer: NOT EMPLOYED     Comment: occasionally   Tobacco Use    Smoking status: Former     Current packs/day: 0.00     Average packs/day: 2.0 packs/day for 40.0 years (80.0 ttl pk-yrs)     Types: Cigarettes     Start date: 01/22/1965     Quit date: 01/22/2005     Years since quitting: 18.6    Smokeless tobacco: Never   Vaping Use    Vaping status: Never Used   Substance and Sexual Activity    Alcohol use: No      Alcohol/week: 0.0 standard drinks of alcohol    Drug use: No    Sexual activity: Yes     Partners: Male     Allergies:  Allergies   Allergen Reactions    Cymbalta [Duloxetine]      Rash on feet and hands    Blue Dye      Told to avoid due to patch test result by [facility]    Flavoring Agent  Other Adverse Reaction (Add comment)     Patient unsure of reaction    Lipitor [Atorvastatin] Myalgia     Muscle pain    Lisinopril  Other Adverse Reaction (Add comment)     Cough      Nickel      Blisters  "Allergic to all kinds of metal"     Codeine Itching     RASH    Mobic [Meloxicam] Nausea/ Vomiting  Nervous and shakes     Naprosyn [Naproxen] Nausea/ Vomiting    Nsaids (Non-Steroidal Anti-Inflammatory Drug) Nausea/ Vomiting       Review of Systems:      Const: Denies   Eyes: Denies   ENMT: Denies  See HPI.  CV: Denies   Resp: Denies  GI: Denies   Musculo: Denies   Skin: Denies   Neuro: Denies   Psych: Denies   Endocrine: Denies   Hema/Lymph: Denies   Reviewed and updated                                                          All other systems reviewed and found to be negative.    Physical Exam:    BP (Non-Invasive): 116/66 Temperature: 36.4 C (97.5 F) Heart Rate: 81   SpO2: 90 %  Height: 165.1 cm (5\' 5" ) Weight: 95.8 kg (211 lb 3.2 oz) Body mass index is 35.15 kg/m.      Const: Appears healthy and well developed. No signs of apparent distress present. Communicates normally.  Head/face: Normal on inspection. Palpation reveals no bilateral sinus tenderness/tenderness forehead/tenderness cheeks. Salivary glands normal in size and texture. Facial strength normal.  House-Brackmann Grading: Grade I: normal facial function in all areas  Eyes: EOMI in both eyes and normal gaze bilaterally. Orbits exhibit normal position.  ENMT:  Ears: External ears WNL. Auditory canals normal. Bilateral tympanic membranes are normal. Auditory acuity: Rinne, R:/L: and Weber. Normal to whispered voice.   Nose: External nose WNL. Internal  nose: Nasal mucosal swelling. There are secretions. Septum with moderate sepal deviation, s-shaped. Inferior turbinates are hypertrophied. See nasal endoscopy.  Nasopharynx: Gag  Oral Cavity: Oral aperture is normal. Lips: Appear normal and healthy. Dentition is in good repair. Occlusion is normal. Gums appear healthy. Tongue appears normal. Oral mucosa moist with no thrush or mucositis.  Oropharynx: Appears normal. Soft palate normal in appearance. Tonsils appear normal. Posterior pharynx is normal.  Hypopharynx: Gag  Larynx: Gag  Neck: Normal to inspection. Unremarkable on palpation. No masses appreciated. Trachea midline. Thyroid is normal in size and texture.  Resp: Normal respiratory activities. No wheezing. Clear to auscultation bilaterally.  CV: Rate is regular. Rhythm is regular. No heart murmur appreciated.  Abdomen: Abdomen is soft, nontender, and nondistended.  Lymph: No visible or palpable anterior or posterior cervical lymphadenopathy.  Musculo: Walks with a normal gait. Temporomandibular joint: No obvious instability. No crepitus or tenderness bilaterally.  Skin: Skin is warm and dry.  Neuro: Mood is normal.  Cranial nerves: Cranial nerves I-XII intact.  Psych: Mood/Affect: Mood is normal. Affect is normal. Cognition: Orientation is intact.    Review of Information:    Pathology: No results found for this or any previous visit (from the past 720 hour(s)).   Imaging:   Recent Results (from the past 720 hour(s))   XR FOOT LEFT     Status: None    Narrative    [name]  Female, 67 years old.    XR FOOT LEFT performed on 07/27/2023 3:05 PM.    REASON FOR EXAM:  R60.454U: Foot trauma, left, initial encounter    TECHNIQUE: 3 views/3 images submitted for interpretation.    COMPARISON:  Left foot radiograph dated 05/27/2022    FINDINGS:    There is  no acute fracture or traumatic malalignment of the left foot. The   joint spaces are overall preserved. Small enthesophyte at the distal   Achilles tendon.  Minimal first MTP degenerative arthrosis.      Impression    No acute osseous abnormality of the left foot.     CT CHEST WO IV CONTRAST     Status: None    Narrative    CT CHEST WO IV CONTRAST performed on 07/28/2023 4:31 PM    INDICATION: 66 years old Female  R05.9: Cough, unspecified type  J47.9: Bronchiectasis without complication (CMS HCC)    TECHNIQUE: Axial images from the thoracic inlet through the lungs with reformatted coronal and sagittal images, utilizing soft tissue and lung algorithms    RADIATION DOSE: 136.22 mGy.cm    COMPARISON: 12/21/2019    FINDINGS:    There is mild paraseptal and centrilobular emphysema.  There is no focal consolidation. There is mild bronchiectasis within the bilateral lower lobes and right middle lobe. No evidence for fibrosis. There is a small groundglass nodular density within the left lower lobe series 4 image 193 measuring approximately 12 mm. This was present in 2021 and appears minimally increased in size from prior study where measured approximately 10 mm. There are a few tiny scattered pulmonary nodules measuring less than 6 mm. No pleural or pericardial effusion. No focal consolidation. There is scattered calcification throughout the aorta and its branches. There is dense coronary artery calcification/stents. No significant mediastinal adenopathy.    Gallbladder surgically absent. AI: Coronary Artery Calcium (CAC) score = Severe; exact score cannot be determined due to possible presence of a stent.This CAC score is based on an independent FDA cleared AI algorithm. This is not a standardized gated CAC score. Estimate may be subject to the limitations of the non-gated CT scan and the novel nature of the algorithm. It should not represent a replacement for traditional risk factor assessment.      Impression    Mild bronchiectasis within the bilateral lower lobes and right middle lobe.    Groundglass subpleural nodular density left lower lobe slightly increased in size from  2021 follow-up CT chest in one year is recommended to ensure stability.   FLUORO ESOPHAGRAM (BA SWALLOW)     Status: None    Narrative    FLUORO ESOPHAGRAM DOUBLE-CONTRAST (AIR) performed on 08/01/2023 10:51 AM    INDICATION: 67 years old Female; R05.9: Cough, unspecified type  J47.9: Bronchiectasis without complication (CMS HCC)    TECHNIQUE: Scout imaging was performed.  After the patient ingested carbon dioxide crystals, fluoroscopic images during consumption of thick and thin barium in multiple erect and supine positions were obtained.    CONTRAST: Barium    FLUORO TIME: 1.1 min    FINDINGS: Scout imaging demonstrates no evidence of acute abnormality.    Transit of contrast from the distal esophagus into the gastric lumen was unremarkable.  In the esophagus, no mucosal irregularity or stricture was seen.  Esophageal peristalsis demonstrates mild tertiary contractions..  Pyriform sinuses and valleculae demonstrated no asymmetric features.  On dynamic imaging, no gastro-esophageal reflux was identified. Questionable small cricopharyngeal bar seen only on one view. There is prominence of the vestibule with a component likely representing a small hiatal hernia..  At the conclusion examination, the patient was given a barium tablet.  The tablet traversed the esophagus without delay.        Impression    1. Small hiatal hernia.  2. Query a small cricopharyngeal  bar seen only on one view.  3. No gastroesophageal reflux during exam.   XR SHOULDER LEFT     Status: None    Narrative    [name]  Female, 67 years old.    XR SHOULDER LEFT performed on 08/16/2023 3:42 PM.    REASON FOR EXAM:  [email or web address].Lorne Skeens: Fall from standing    TECHNIQUE: 4 views/4 images submitted for interpretation.    COMPARISON:  None    FINDINGS:    There is no acute fracture traumatic malalignment of the left shoulder. The joint spaces are preserved. The acromioclavicular joint is unremarkable. There is no significant soft tissue abnormality.  Visualized lung fields are clear.      Impression    No acute osseous abnormality of the left shoulder.   XR FOOT RIGHT     Status: None    Narrative    [name]  Female, 67 years old.    XR FOOT RIGHT performed on 08/16/2023 3:46 PM.    REASON FOR EXAM:  [email or web address].Lorne Skeens: Fall from standing    TECHNIQUE: 3 views/3 images submitted for interpretation.    COMPARISON:  Foot radiograph dated 04/10/2021    FINDINGS:    No acute fracture or traumatic malalignment of the right foot. There is no significant soft tissue swelling. The joint spaces are preserved. An Achilles enthesophyte is noted.      Impression    No acute osseous abnormality of the right foot.        Procedure:  ENT, [facility]  [address]  [place] [address]    Procedure Note    Name: [name] MRN:  [id number]   Date: 08/16/2023 DOB:  19-Dec-1955 (67 y.o.)         31575 - LARYNGOSCOPY, FLEXIBLE DIAGNOSTIC (AMB ONLY)    Performed by: Leeanne Deed, MD  Authorized by: Leeanne Deed, MD    Time Out:     Immediately before the procedure, a time out was called:  Yes    Patient verified:  Yes    Procedure Verified:  Yes    Site Verified:  Yes  Documentation:      [facility]  DEPARTMENT OF OTOLARYNGOLOGY - HEAD AND NECK SURGERY  PROCEDURE NOTE      Name: [name], 67 y.o. female  MRN: [id number]  DOB: [birth year]  Date of Service: 08/16/2023     Procedure: Flexible Laryngoscopy, Diagnostic [phone])  Indications: Due to patient's symptoms, and because indirect mirror examination was inadequate, flexible fiberoptic transnasal laryngoscopy is required to visualize any possible lesions, masses or other abnormalities.    Procedure:   After explaining the procedure to the patient, a flexible laryngoscope was passed into the nasal cavity, and the nasopharynx, oropharynx, hypopharynx and larynx were examined.    Findings:    Nasopharynx: Normal mucosa and no lesions of the nasopharyngeal walls.  The Eustachian  tube orifices are normal.     Hypopharynx:  No lesions of the epiglottis, posterior pharyngeal walls or piriform mucosa.  There is no pooling of secretions. Swelling of base of tongue. Grade IV lingual tonsils hypertrophy.      Larynx: Swelling of the true and false vocal folds. Clear secretions in the glottic space. . There are no lesions of the aryepiglottic folds, arytenoid mucosa, interarytenoid mucosa, false vocal folds or true vocal folds.  There is good abduction with sniff.  The airway is patent.  Posterior and superior laryngeal swelling.  The patient tolerated the procedure well.    Leeanne Deed, MD          Leeanne Deed, MD    Working Diagnosis:      ICD-10-CM    1. Chronic cough  R05.3 31575 - LARYNGOSCOPY, FLEXIBLE DIAGNOSTIC (AMB ONLY)      2. Non-seasonal allergic rhinitis due to other allergic trigger  J30.89       3. Other allergic rhinitis  J30.89       4. DNS (deviated nasal septum)  J34.2       5. Epistaxis  R04.0       6. Long term (current) use of anticoagulants  Z79.01       7. Laryngopharyngeal reflux (LPR)  K21.9 31575 - LARYNGOSCOPY, FLEXIBLE DIAGNOSTIC (AMB ONLY)              Assessment and Plan:    [name] is a 67 y.o. female:     Unknown cause of her cough; due to acid reflux symptoms. Complaining with this cough.  Flexible laryngoscopy was performed.   Advised to continue Pantoprazole 40 mg in the morning and Pepcid 40 mg before dinner.  Started on Mupirocin ointment to help reduce nose bleeding.  Break from Flonase until no more bleeding. Proper use counseled.  Will consider superior laryngeal nerve block if not improved.   Follow up in ,4 weeks and as needed.       Orders Placed This Encounter    31575 - LARYNGOSCOPY, FLEXIBLE DIAGNOSTIC (AMB ONLY)    lidocaine 4 % + phenylephrine 1 % (1:1) nasal solution    mupirocin (BACTROBAN) 2 % Ointment           Leeanne Deed, MD     08/25/2023 21:51     Leeanne Deed, MD    CC:    PCP Ovidio Hanger, PA-C  [address]  [facility] [address]   Referring Provider Ovidio Hanger, PA-C  [address]  [place],  [place] [address]

## 2023-08-17 ENCOUNTER — Ambulatory Visit (HOSPITAL_BASED_OUTPATIENT_CLINIC_OR_DEPARTMENT_OTHER): Payer: Self-pay

## 2023-08-17 DIAGNOSIS — S99921A Unspecified injury of right foot, initial encounter: Secondary | ICD-10-CM

## 2023-08-17 DIAGNOSIS — S4992XA Unspecified injury of left shoulder and upper arm, initial encounter: Secondary | ICD-10-CM

## 2023-08-17 LAB — FUNGUS CULTURE

## 2023-08-17 NOTE — Telephone Encounter (Signed)
Regarding: Clinical Question  ----- Message from Orbie Hurst sent at 08/17/2023  2:29 PM EDT -----  Copied From CRM #[id number].Mayford Knife, Madelyne MARIE called with a clinical question.   Pt is calling states she had xrays done yesterday and was told she would be called back but has not gotten a response yet. Pt is asking for a call back regarding her results please. Best call back is [phone].    Thanks Sheralyn Boatman

## 2023-08-18 ENCOUNTER — Ambulatory Visit: Payer: Medicare (Managed Care) | Attending: PHYSICIAN ASSISTANT | Admitting: PHYSICIAN ASSISTANT

## 2023-08-18 ENCOUNTER — Encounter (INDEPENDENT_AMBULATORY_CARE_PROVIDER_SITE_OTHER): Payer: Self-pay | Admitting: PHYSICIAN ASSISTANT

## 2023-08-18 ENCOUNTER — Other Ambulatory Visit: Payer: Self-pay

## 2023-08-18 VITALS — BP 150/80 | HR 72 | Temp 97.4°F | Resp 20 | Ht 65.0 in | Wt 211.6 lb

## 2023-08-18 DIAGNOSIS — W19XXXA Unspecified fall, initial encounter: Secondary | ICD-10-CM

## 2023-08-18 DIAGNOSIS — M542 Cervicalgia: Secondary | ICD-10-CM | POA: Insufficient documentation

## 2023-08-18 DIAGNOSIS — M25519 Pain in unspecified shoulder: Secondary | ICD-10-CM | POA: Insufficient documentation

## 2023-08-18 DIAGNOSIS — M5090 Cervical disc disorder, unspecified, unspecified cervical region: Secondary | ICD-10-CM | POA: Insufficient documentation

## 2023-08-18 DIAGNOSIS — M25512 Pain in left shoulder: Secondary | ICD-10-CM

## 2023-08-18 DIAGNOSIS — M791 Myalgia, unspecified site: Secondary | ICD-10-CM | POA: Insufficient documentation

## 2023-08-18 NOTE — Nursing Note (Signed)
Procedure Follow Up  [name]  [id number]  Chief Complaint   Patient presents with    Neck Pain       [place] Pain Rating Scale     On a scale of 0-10, during the past 24 hours, pain has interfered with you usual activity: 8     On a scale of 0-10, during the past 24 hours, pain has interfered with your sleep: 8    On a scale of 0-10, during the past 24 hours, pain has affected your mood: 6     On a scale of 0-10, during the past 24 hours, pain has contributed to your stress: 7     On a scale of 0-10, what is your overall pain Rating: 7      Vitals:    08/18/23 1350   BP: (!) 150/80   Pulse: 72   Resp: 20   Temp: 36.3 C (97.4 F)   SpO2: 97%   Weight: 96 kg (211 lb 9.6 oz)   Height: 1.651 m (5\' 5" )   PainSc:   7   PainLoc: Neck     Body mass index is 35.21 kg/m.    New imaging:  Left shoulder xray 08/16/23  Patient is here today S/ P tpi that was done on 07/29/23 and reports 80 % relief that lasted 2 weeks.    Princess Perna, [place]  08/18/2023, 13:53

## 2023-08-18 NOTE — Progress Notes (Signed)
[facility]  [address]  [place], [place] [address]  [phone]    Progress Note    [name]  MRN: [id number]  DOB: 14-Jul-1956  Date of Service: 08/18/2023    CHIEF COMPLAINT  Chief Complaint   Patient presents with    Neck Pain       SUBJECTIVE  [name] is a 67 y.o. female who presents to clinic for procedure follow up. Patient had TPI's - B C/L/S paravertebral and B Trapezii performed on 07/29/23 with Dr. Marnette Burgess. Patient endorses 80% relief 18 day(s).  Reporting she fell a couple of days ago resulting in an injury to the left shoulder. This area is reported to be sore with bruising. This is a different pain than what was treated by the TPI's. No pain on the right.  X-rays of the left shoulder were performed on 08/16/23 - No acute abnormality seen. Denies any side effects or complication with the procedure. Patient reports improved pain and ROM during the time benefit was seen from the injection.      Medications:   albuterol sulfate (PROAIR HFA) 90 mcg/actuation Inhalation oral inhaler, Take 2 Puffs by inhalation Every 4 hours as needed  aspirin 81 mg Oral Tablet, Chewable, Chew 1 Tablet (81 mg total) Every morning  baclofen (LIORESAL) 5 mg Oral Tablet, Take 1 Tablet (5 mg total) by mouth Three times a day as needed  BRILINTA 90 mg Oral Tablet, Take 1 Tablet (90 mg total) by mouth Twice daily  clobetasoL (TEMOVATE) 0.05 % Ointment, Apply topically Twice daily  desvenlafaxine (PRISTIQ) 100 mg Oral Tablet Sustained Release 24 hr, Take 1 Tablet (100 mg total) by mouth Once a day (Patient taking differently: Take 1 Tablet (100 mg total) by mouth Every morning)  diclofenac sodium (VOLTAREN) 1 % Gel, Apply topically Four times a day - before meals and bedtime (Patient taking differently: Apply topically Four times a day as needed)  famotidine (PEPCID) 40 mg Oral Tablet, Take 1 Tablet (40 mg total) by mouth Twice daily  fluticasone propionate (FLONASE)  50 mcg/actuation Nasal Spray, Suspension, INSTILL 2 SPRAYS INTO EACH NOSTRIL ONCE A DAY (Patient taking differently: 2 Sprays Every morning)  gabapentin (NEURONTIN) 800 mg Oral Tablet, Take 1 Tablet (800 mg total) by mouth Twice daily  ipratropium-albuterol 0.5 mg-3 mg(2.5 mg base)/3 mL Solution for Nebulization, Take 3 mL by nebulization Every 6 hours as needed for Wheezing for up to 30 days  metoprolol succinate (TOPROL-XL) 25 mg Oral Tablet Sustained Release 24 hr, Take 1 Tablet (25 mg total) by mouth Once a day (Patient taking differently: Take 1 Tablet (25 mg total) by mouth Every evening)  montelukast (SINGULAIR) 10 mg Oral Tablet, Take 1 Tablet (10 mg total) by mouth Every evening  mupirocin (BACTROBAN) 2 % Ointment, Apply to each nostril Once a day for 30 days  nitroGLYCERIN (NITROSTAT) 0.4 mg Sublingual Tablet, Sublingual, DISSOLVE 1 TABLET UNDER THE TONGUE AS NEEDED FOR CHEST PAIN EVERY 5 MINUTES UP TO 3 TIMES. IF NO RELIEF CALL 911.  ondansetron (ZOFRAN) 4 mg Oral Tablet, Take 1 Tablet (4 mg total) by mouth Every 8 hours as needed for Nausea/Vomiting  pantoprazole (PROTONIX) 40 mg Oral Tablet, Delayed Release (E.C.), Take 1 Tablet (40 mg total) by mouth Once a day Takes in am (Patient taking differently: Take 1 Tablet (40 mg total) by mouth Every morning Takes in am)  rosuvastatin (CRESTOR) 20 mg Oral Tablet, Take 1 Tablet (20 mg  total) by mouth Every evening  solifenacin (VESICARE) 10 mg Oral Tablet, Take 1 Tablet (10 mg total) by mouth Once a day  tiotropium bromide (SPIRIVA RESPIMAT) 2.5 mcg/actuation Inhalation oral inhaler, Take 2 Inhalations (2 Puffs total) by inhalation Once a day (Patient taking differently: Take 2 Inhalations (2 Puffs total) by inhalation Every evening)  umeclidinium (INCRUSE ELLIPTA) 62.5 mcg/actuation Inhalation oral inhaler, Take 1 Inhalation by inhalation Once a day  predniSONE (DELTASONE) 5 mg Oral Tablet, Take 5 Tab (25mg ) by mouth QD x1 day. Then 2 Tab (10mg ) QD x1 day.  Then 1 Tab (5mg ) QD x1 day. (Patient not taking: Reported on 08/11/2023)    No facility-administered medications prior to visit.      Allergies:   Allergies   Allergen Reactions    Cymbalta [Duloxetine]      Rash on feet and hands    Blue Dye      Told to avoid due to patch test result by [facility]    Flavoring Agent  Other Adverse Reaction (Add comment)     Patient unsure of reaction    Lipitor [Atorvastatin] Myalgia     Muscle pain    Lisinopril  Other Adverse Reaction (Add comment)     Cough      Nickel      Blisters  "Allergic to all kinds of metal"     Codeine Itching     RASH    Mobic [Meloxicam] Nausea/ Vomiting     Nervous and shakes     Naprosyn [Naproxen] Nausea/ Vomiting    Nsaids (Non-Steroidal Anti-Inflammatory Drug) Nausea/ Vomiting       BASIC METABOLIC PANEL  Lab Results   Component Value Date    SODIUM 140 07/13/2023    POTASSIUM 4.3 07/13/2023    CHLORIDE 109 07/13/2023    CO2 23 07/13/2023    ANIONGAP 8 07/13/2023    BUN 9 07/13/2023    CREATININE 1.09 (H) 07/13/2023    BUNCRRATIO 8 07/13/2023    GFR 56 (L) 07/13/2023    CALCIUM 9.4 07/13/2023    GLUCOSE 93 07/13/2023    GLUCOSENF 101 03/05/2019        CBC  Diff   Lab Results   Component Value Date/Time    WBC 11.8 (H) 05/20/2023 11:52 AM    HGB 15.1 05/20/2023 11:52 AM    HCT 46.6 (H) 05/20/2023 11:52 AM    PLTCNT 545 (H) 05/20/2023 11:52 AM    SEDRATE 30 03/29/2008 12:42 PM    RBC 5.31 (H) 05/20/2023 11:52 AM    MCV 87.8 05/20/2023 11:52 AM    MCHC 32.4 05/20/2023 11:52 AM    MCH 28.4 05/20/2023 11:52 AM    RDW 14.5 12/12/2017 03:52 PM    MPV 9.8 05/20/2023 11:52 AM    Lab Results   Component Value Date/Time    PMNS 14 08/11/2023 02:22 PM    LYMPHOCYTES 1 08/11/2023 02:22 PM    EOSINOPHIL 2 12/12/2017 03:52 PM    MONOCYTES 7.5 05/20/2023 11:52 AM    BASOPHILS 0.2 05/20/2023 11:52 AM    BASOPHILS <0.10 05/20/2023 11:52 AM    PMNABS 8.17 (H) 05/20/2023 11:52 AM    LYMPHSABS 2.66 05/20/2023 11:52 AM    EOSABS <0.10 05/20/2023 11:52 AM    MONOSABS  0.88 05/20/2023 11:52 AM    BASOSABS 0.085 11/28/2014 04:49 PM          Lab Results   Component Value Date    HA1C 5.6 05/20/2023  Review of Systems:   CONSTITUTIONAL: Denies weight loss. Denies fever.   GI: Denies bowel incontinence.  GU: Denies urinary incontinence or retention.  NEUROLOGICAL: Denies headaches.   PSYCHIATRIC: Denies depression or anxiety. Denies acute changes in mood.  MUSCULOSKELETAL: See HPI.    OBJECTIVE  BP (!) 150/80   Pulse 72   Temp 36.3 C (97.4 F)   Resp 20   Ht 1.651 m (5\' 5" )   Wt 96 kg (211 lb 9.6 oz)   SpO2 97%   BMI 35.21 kg/m           General: no distress   Abdomen: soft, non-tender and non-distended   Respiratory: No increased work of breathing. No increased accessory muscle use   Cardiovascular: acyanotic, no JVD  Skin: warm and dry, no rash  Neurologic: gait is normal , motor 5/5 and sensory intact in b/l LEs, AOx3, CN 2-12 grossly intact, negative Hoffman's b/l         Deep Tendon Reflexes     Brachioradialis Bicep Patellar Achilles   Right  2+ 2+ 2+ 2+   Left 2+ 2+ 2+ 2+      Psychiatric: normal affect and behavior  Musculoskeletal: no cyanosis or edema  Cervical   ROM: Limited rotation   Palpation:  Tender   Motor: 5/5 in UEs b/l   Sensory: Intact in UEs b/l   Facet loading: Negative bilaterally    Facet TTP: Negative bilaterally    Trigger points: Cervical paraspinal bilateral and Trapezius bilateral   Spurling's: Positive bilaterally       Imaging: Reviewed    Recent Results (from the past [id number] hour(s))   MRI SPINE CERVICAL WO CONTRAST    Collection Time: 06/19/22 11:21 AM    Narrative    [name]  Female, 67 years old.    MRI SPINE CERVICAL WO CONTRAST performed on 06/19/2022 11:21 AM.    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 There is mild loss of disc height at all the levels along with mild generalized facet and uncovertebral arthropathy.    The C2-C3, C3-C4 and C4-C5 levels demonstrate patent spinal canal and neural foramina. Mild disc bulge is present at C4-C5.    The C5-C6 and C6-C7 levels demonstrate disc bulge resulting in mild spinal stenosis. There is significant right-sided foraminal encroachment at C5-C6 and mild bilateral foraminal encroachment at C6-C7.    The C7-T1 level demonstrates patent spinal canal and neural foramina.    The spinal cord demonstrates preserved caliber and signal and prevertebral paraspinal soft tissues are unremarkable.      Impression    Mild cervical spondylosis relatively more focal at the mid cervical spine at the C5-C6 and C6-C7 levels. Findings are similar to the prior MRI examination.          ASSESSMENT    ICD-10-CM    1. Cervical neck pain with evidence of disc disease  M50.90       2. Myalgia  M79.10       3. Neck pain  M54.2       4. Shoulder pain, unspecified chronicity, unspecified laterality  M25.519             PLAN/RECOMMENDATION  No orders of the defined types  were placed in this encounter.      1. Patient to continue under the care of the physician who is treating this recent injury.   2. No further injections necessary at this time. She is to call if she has a need for repeat TPI's.   3. She plans to return to her chiropractor for treatment of her shoulder, I see no reason to recommend against this.     Follow up PRN    Our impression, treatment recommendations and plan from today's visit were reviewed in detail with the patient in the office. All of the patient's questions were answered. The patient is in agreement with our plan. The procedure was explained using a spine model,  including technique, benefits, alternatives and the associated risks.  The patient verbalized understanding of the above plan and the patient wishes to move  forward with the above noted plan.    Beverly Gust, PA-C 08/18/2023, 14:09

## 2023-08-21 NOTE — Procedures (Addendum)
ENT, [facility]  [address]  [place] [address]    Procedure Note    Name: [name] MRN:  [id number]   Date: 08/16/2023 DOB:  [birth year] (67 y.o.)         [address] - LARYNGOSCOPY, FLEXIBLE DIAGNOSTIC (AMB ONLY)    Performed by: Leeanne Deed, MD  Authorized by: Leeanne Deed, MD    Time Out:     Immediately before the procedure, a time out was called:  Yes    Patient verified:  Yes    Procedure Verified:  Yes    Site Verified:  Yes  Documentation:      [facility]  DEPARTMENT OF OTOLARYNGOLOGY - HEAD AND NECK SURGERY  PROCEDURE NOTE      Name: [name], 67 y.o. female  MRN: [id number]  DOB: May 26, 1956  Date of Service: 08/16/2023     Procedure: Flexible Laryngoscopy, Diagnostic [phone])  Indications: Due to patient's symptoms, and because indirect mirror examination was inadequate, flexible fiberoptic transnasal laryngoscopy is required to visualize any possible lesions, masses or other abnormalities.    Procedure:   After explaining the procedure to the patient, a flexible laryngoscope was passed into the nasal cavity, and the nasopharynx, oropharynx, hypopharynx and larynx were examined.    Findings:    Nasopharynx: Normal mucosa and no lesions of the nasopharyngeal walls.  The Eustachian tube orifices are normal.     Hypopharynx:  No lesions of the epiglottis, posterior pharyngeal walls or piriform mucosa.  There is no pooling of secretions. Swelling of base of tongue. Grade IV lingual tonsils hypertrophy.      Larynx: Swelling of the true and false vocal folds. Clear secretions in the glottic space. . There are no lesions of the aryepiglottic folds, arytenoid mucosa, interarytenoid mucosa, false vocal folds or true vocal folds.  There is good abduction with sniff.  The airway is patent.  Posterior and superior laryngeal swelling.     The patient tolerated the procedure well.    Leeanne Deed, MD          Leeanne Deed, MD

## 2023-08-22 ENCOUNTER — Ambulatory Visit (INDEPENDENT_AMBULATORY_CARE_PROVIDER_SITE_OTHER): Payer: Medicare (Managed Care) | Admitting: Chiropractor

## 2023-08-22 ENCOUNTER — Other Ambulatory Visit: Payer: Self-pay

## 2023-08-22 DIAGNOSIS — M791 Myalgia, unspecified site: Secondary | ICD-10-CM

## 2023-08-22 DIAGNOSIS — M9903 Segmental and somatic dysfunction of lumbar region: Secondary | ICD-10-CM

## 2023-08-22 DIAGNOSIS — M5090 Cervical disc disorder, unspecified, unspecified cervical region: Secondary | ICD-10-CM

## 2023-08-22 DIAGNOSIS — M9901 Segmental and somatic dysfunction of cervical region: Secondary | ICD-10-CM

## 2023-08-22 DIAGNOSIS — M9904 Segmental and somatic dysfunction of sacral region: Secondary | ICD-10-CM

## 2023-08-22 DIAGNOSIS — M9902 Segmental and somatic dysfunction of thoracic region: Secondary | ICD-10-CM

## 2023-08-22 NOTE — Progress Notes (Signed)
[facility]  [address]  [place] [address]  [phone]        Name: [name]  MRN: [id number]  Date:08/22/2023  Birthday: [birth year]      Chief Complaint   Patient presents with    Neck Pain    Back Pain    Myalgia         Subjective:  [name] presents for follow up care. She states that after her last visit she felt pretty good up until last Sunday 08-14-23. She states that she missed a step and fell to the ground. She states that initially she did not feel that bad, but the next day she was stiff and sore.    Objective:       [place] Pain Rating Scale     On a scale of 0-10, during the past 24 hours, pain has interfered with you usual activity: 7     On a scale of 0-10, during the past 24 hours, pain has interfered with your sleep: 7    On a scale of 0-10, during the past 24 hours, pain has affected your mood: 6     On a scale of 0-10, during the past 24 hours, pain has contributed to your stress: 7     On a scale of 0-10, what is your overall pain Rating: 6    Cervical ROM: Decreased.  Lumbar ROM: Decreased.  Joint mobility restrictions: C1-C2, C7-T1, T4-T5, T5-T6, L5-S1, right upper sacroliliac  Tender points/Trigger points: Cervical paraspinals/ upper traps and lumbar paraspinals.  Hypertonicity: Cervical paraspinals/ upper traps and lumbar paraspinals.      Assessment:       ICD-10-CM    1. Segmental dysfunction of cervical region  M99.01       2. 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 Segmental dysfunction of thoracic region  M99.02       4. Segmental dysfunction of lumbar region  M99.03       5. Segmental dysfunction of sacral region  M99.04       6. Myalgia  M79.10           Plan:  Continue with care as outlined in the treatment plan:      Today's treatment:    Chiropractic manipulation of T4-T6, L5 and the right SI with drop piece to improve joint function and mobility.   Pt tolerated today's visit well and  she reported feeling a little better.    CPT Code: 44010.    Arlyn Leak, DC

## 2023-08-29 ENCOUNTER — Other Ambulatory Visit: Payer: Self-pay

## 2023-08-29 ENCOUNTER — Encounter (HOSPITAL_BASED_OUTPATIENT_CLINIC_OR_DEPARTMENT_OTHER): Payer: Self-pay | Admitting: Physician Assistant

## 2023-08-29 ENCOUNTER — Ambulatory Visit: Payer: Medicare (Managed Care) | Attending: Physician Assistant | Admitting: Physician Assistant

## 2023-08-29 VITALS — BP 102/62 | HR 99 | Temp 97.5°F | Ht 65.0 in | Wt 210.8 lb

## 2023-08-29 DIAGNOSIS — K219 Gastro-esophageal reflux disease without esophagitis: Secondary | ICD-10-CM | POA: Insufficient documentation

## 2023-08-29 DIAGNOSIS — I2510 Atherosclerotic heart disease of native coronary artery without angina pectoris: Secondary | ICD-10-CM | POA: Insufficient documentation

## 2023-08-29 DIAGNOSIS — R053 Chronic cough: Secondary | ICD-10-CM | POA: Insufficient documentation

## 2023-08-29 DIAGNOSIS — W19XXXA Unspecified fall, initial encounter: Secondary | ICD-10-CM

## 2023-08-29 DIAGNOSIS — F332 Major depressive disorder, recurrent severe without psychotic features: Secondary | ICD-10-CM | POA: Insufficient documentation

## 2023-08-29 DIAGNOSIS — G629 Polyneuropathy, unspecified: Secondary | ICD-10-CM | POA: Insufficient documentation

## 2023-08-29 MED ORDER — PANTOPRAZOLE 40 MG TABLET,DELAYED RELEASE
40.0000 mg | DELAYED_RELEASE_TABLET | Freq: Two times a day (BID) | ORAL | 1 refills | Status: DC
Start: 2023-08-29 — End: 2023-08-29

## 2023-08-29 MED ORDER — PANTOPRAZOLE 40 MG TABLET,DELAYED RELEASE
40.0000 mg | DELAYED_RELEASE_TABLET | Freq: Two times a day (BID) | ORAL | 1 refills | Status: DC
Start: 2023-08-29 — End: 2024-03-06

## 2023-08-29 NOTE — Progress Notes (Addendum)
[facility], [facility]  [address]  [place] [address]  Operated by [facility]  Return Patient Visit    Name: [name] MRN:  [id number]   Date: 08/29/2023 DOB:  16-Dec-1955 (67 y.o.)   PCP: Ovidio Hanger, PA-C    Chief Complaint  Chief Complaint   Patient presents with    Annual Exam     Subjective   [name] is a 67 y.o. female who presents to clinic for return monitoring.    Chronic Cough / GERD: Patient reports a chronic dry cough. She follows with ENT and reports having a swollen voice box possibly secondary to acid reflux and a hiatal hernia. Patient is taking 40 mg Pepcid BID and 40 mg of Protonix once daily and reports symptoms not fully controlled. Patient reports coughing fits following her ipratropium-albuterol nebulizer.    Neuropathy: Patient goes to the pain clinic for cervical degenerative arthritis with radiculopathy and receives steroid injections. She reports this helping for several months.    CAD: Patient is stable taking metoprolol succinate 25 mg daily, Brilinta 90 mg twice daily, and aspirin 81 mg every morning.    MDD: Patient still reports feeling sad occasionally without reason. She is taking Pristiq 100 mg daily.    Fall: Patient reports falling about one week ago walking up stairs and developed subsequent lower back pain. She denies hitting her head. Patient reports increased dizziness since starting Vesicare for her urinary incontinence about one month ago.    Patient states she received a call from the [facility] nurse stating that she needs a physical / Annual wellness Visit for her insurance.      Medication List  albuterol sulfate (PROAIR HFA) 90 mcg/actuation Inhalation oral inhaler, Take 2 Puffs by inhalation Every 4 hours as needed  aspirin 81 mg Oral Tablet, Chewable, Chew 1 Tablet (81 mg total) Every morning  baclofen (LIORESAL) 5 mg Oral Tablet, Take 1 Tablet (5 mg total) by mouth Three times a day as  needed  BRILINTA 90 mg Oral Tablet, Take 1 Tablet (90 mg total) by mouth Twice daily  clobetasoL (TEMOVATE) 0.05 % Ointment, Apply topically Twice daily  desvenlafaxine (PRISTIQ) 100 mg Oral Tablet Sustained Release 24 hr, Take 1 Tablet (100 mg total) by mouth Once a day (Patient taking differently: Take 1 Tablet (100 mg total) by mouth Every morning)  diclofenac sodium (VOLTAREN) 1 % Gel, Apply topically Four times a day - before meals and bedtime (Patient taking differently: Apply topically Four times a day as needed)  famotidine (PEPCID) 40 mg Oral Tablet, Take 1 Tablet (40 mg total) by mouth Twice daily  fluticasone propionate (FLONASE) 50 mcg/actuation Nasal Spray, Suspension, INSTILL 2 SPRAYS INTO EACH NOSTRIL ONCE A DAY (Patient taking differently: 2 Sprays Every morning)  gabapentin (NEURONTIN) 800 mg Oral Tablet, Take 1 Tablet (800 mg total) by mouth Twice daily  ipratropium-albuterol 0.5 mg-3 mg(2.5 mg base)/3 mL Solution for Nebulization, Take 3 mL by nebulization Every 6 hours as needed for Wheezing for up to 30 days  metoprolol succinate (TOPROL-XL) 25 mg Oral Tablet Sustained Release 24 hr, Take 1 Tablet (25 mg total) by mouth Once a day (Patient taking differently: Take 1 Tablet (25 mg total) by mouth Every evening)  montelukast (SINGULAIR) 10 mg Oral Tablet, Take 1 Tablet (10 mg total) by mouth Every evening  mupirocin (BACTROBAN) 2 % Ointment, Apply to each nostril Once a day for 30 days  nitroGLYCERIN (  NITROSTAT) 0.4 mg Sublingual Tablet, Sublingual, DISSOLVE 1 TABLET UNDER THE TONGUE AS NEEDED FOR CHEST PAIN EVERY 5 MINUTES UP TO 3 TIMES. IF NO RELIEF CALL 911.  ondansetron (ZOFRAN) 4 mg Oral Tablet, Take 1 Tablet (4 mg total) by mouth Every 8 hours as needed for Nausea/Vomiting  rosuvastatin (CRESTOR) 20 mg Oral Tablet, Take 1 Tablet (20 mg total) by mouth Every evening  solifenacin (VESICARE) 10 mg Oral Tablet, Take 1 Tablet (10 mg total) by mouth Once a day  tiotropium bromide (SPIRIVA  RESPIMAT) 2.5 mcg/actuation Inhalation oral inhaler, Take 2 Inhalations (2 Puffs total) by inhalation Once a day (Patient taking differently: Take 2 Inhalations (2 Puffs total) by inhalation Every evening)  umeclidinium (INCRUSE ELLIPTA) 62.5 mcg/actuation Inhalation oral inhaler, Take 1 Inhalation by inhalation Once a day  pantoprazole (PROTONIX) 40 mg Oral Tablet, Delayed Release (E.C.), Take 1 Tablet (40 mg total) by mouth Once a day Takes in am (Patient taking differently: Take 1 Tablet (40 mg total) by mouth Every morning Takes in am)    No facility-administered medications prior to visit.    Objective   BP 102/62   Pulse 99   Temp 36.4 C (97.5 F) (Thermal Scan)   Ht 1.651 m (5\' 5" )   Wt 95.6 kg (210 lb 12.2 oz)   SpO2 94%   BMI 35.07 kg/m     Notewriter:Physical Exam  Constitutional:       Appearance: Normal appearance.   HENT:      Head: Normocephalic and atraumatic.      Nose: Nose normal.   Cardiovascular:      Rate and Rhythm: Normal rate and regular rhythm.   Pulmonary:      Effort: Pulmonary effort is normal.      Breath sounds: Normal breath sounds.   Neurological:      Mental Status: She is alert and oriented to person, place, and time.   Psychiatric:         Mood and Affect: Mood normal.         Behavior: Behavior normal.         Assessment & Plan    [name] is a 67 y.o. female who was seen today for:    (R05.3) Chronic cough  (primary encounter diagnosis)  (K21.9) Gastroesophageal reflux disease, unspecified whether esophagitis present  Plan:  Chronic, uncontrolled  Start Protonix 40 mg twice daily (instead of once daily)  Continue Pepcid 40 mg twice daily  Awaiting superior laryngeal nerve block through ENT  Continue management with ENT  Discontinue ipratropium-albuterol nebulizer due to coughing fits    (G62.9) Neuropathy (CMS HCC)  Plan:  Chronic, stable  Continue gabapentin 800 mg twice daily, baclofen 5 mg three times daily as needed, voltaren 1% gel 4 times daily  PRN.  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    Juniel Groene Dalton, PA-C

## 2023-09-01 ENCOUNTER — Encounter (HOSPITAL_COMMUNITY): Payer: Self-pay

## 2023-09-05 ENCOUNTER — Ambulatory Visit (INDEPENDENT_AMBULATORY_CARE_PROVIDER_SITE_OTHER): Payer: Medicare (Managed Care) | Admitting: Chiropractor

## 2023-09-05 ENCOUNTER — Other Ambulatory Visit: Payer: Self-pay

## 2023-09-05 DIAGNOSIS — M9903 Segmental and somatic dysfunction of lumbar region: Secondary | ICD-10-CM

## 2023-09-05 DIAGNOSIS — M9901 Segmental and somatic dysfunction of cervical region: Secondary | ICD-10-CM

## 2023-09-05 DIAGNOSIS — M791 Myalgia, unspecified site: Secondary | ICD-10-CM

## 2023-09-05 DIAGNOSIS — M5090 Cervical disc disorder, unspecified, unspecified cervical region: Secondary | ICD-10-CM

## 2023-09-05 DIAGNOSIS — M9904 Segmental and somatic dysfunction of sacral region: Secondary | ICD-10-CM

## 2023-09-05 DIAGNOSIS — M9902 Segmental and somatic dysfunction of thoracic region: Secondary | ICD-10-CM

## 2023-09-05 NOTE — Progress Notes (Signed)
[facility]  [address]  [place] [address]  [phone]        Name: [name]  MRN: [id number]  Date:09/05/2023  Birthday: 06/22/1956      Chief Complaint   Patient presents with    Neck Pain    Back Pain    Myalgia         Subjective:  [name] presents for follow up care. She states that her back is pretty stiff and sore this morning.    Objective:       [place] Pain Rating Scale     On a scale of 0-10, during the past 24 hours, pain has interfered with you usual activity: 7     On a scale of 0-10, during the past 24 hours, pain has interfered with your sleep: 8    On a scale of 0-10, during the past 24 hours, pain has affected your mood: 8     On a scale of 0-10, during the past 24 hours, pain has contributed to your stress: 7     On a scale of 0-10, what is your overall pain Rating: 7    Cervical ROM: Decreased.  Lumbar ROM: Decreased.  Joint mobility restrictions: C1-C2, C7-T1, T4-T5, T5-T6, L5-S1, right upper sacroliliac  Tender points/Trigger points: Cervical paraspinals/ upper traps and lumbar paraspinals.  Hypertonicity: Cervical paraspinals/ upper traps and lumbar paraspinals.      Assessment:       ICD-10-CM    1. Segmental dysfunction of cervical region  M99.01       2. 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 Segmental dysfunction of thoracic region  M99.02       4. Segmental dysfunction of lumbar region  M99.03       5. Segmental dysfunction of sacral region  M99.04       6. Myalgia  M79.10           Plan:  Continue with care as outlined in the treatment plan:      Today's treatment:    Chiropractic manipulation of T4,T5, L5 and the right SI with drop piece to improve joint function and mobility.   Pt tolerated today's visit well.    CPT Code: 65784.    Arlyn Leak, DC

## 2023-09-06 ENCOUNTER — Ambulatory Visit (INDEPENDENT_AMBULATORY_CARE_PROVIDER_SITE_OTHER): Payer: Self-pay | Admitting: Internal Medicine

## 2023-09-08 LAB — FUNGUS CULTURE: FUNGAL CULTURE: NO GROWTH

## 2023-09-13 ENCOUNTER — Ambulatory Visit (INDEPENDENT_AMBULATORY_CARE_PROVIDER_SITE_OTHER): Payer: Self-pay | Admitting: OTOLARYNGOLOGY

## 2023-09-13 NOTE — Telephone Encounter (Signed)
Spoke to patient, advised no need for driver tomorrow. Patient stated understanding and denies further questions at this time

## 2023-09-13 NOTE — Telephone Encounter (Signed)
Regarding: Clinical Question-khabbaz  ----- Message from Brandt R sent at 09/13/2023 12:38 PM EST -----  Copied From CRM [phone].Mayford Knife, Latisha MARIE called with a clinical question.   Patient is wanting to know if she needs a driver for tomorrow's appt.  She can be reached at [phone].  Thank you

## 2023-09-14 ENCOUNTER — Other Ambulatory Visit: Payer: Self-pay

## 2023-09-14 ENCOUNTER — Ambulatory Visit: Payer: Medicare (Managed Care) | Attending: OTOLARYNGOLOGY | Admitting: OTOLARYNGOLOGY

## 2023-09-14 ENCOUNTER — Encounter (INDEPENDENT_AMBULATORY_CARE_PROVIDER_SITE_OTHER): Payer: Self-pay | Admitting: OTOLARYNGOLOGY

## 2023-09-14 VITALS — BP 120/80 | HR 80 | Temp 97.5°F | Ht 65.0 in | Wt 209.7 lb

## 2023-09-14 DIAGNOSIS — R040 Epistaxis: Secondary | ICD-10-CM | POA: Insufficient documentation

## 2023-09-14 DIAGNOSIS — J342 Deviated nasal septum: Secondary | ICD-10-CM | POA: Insufficient documentation

## 2023-09-14 DIAGNOSIS — Z7901 Long term (current) use of anticoagulants: Secondary | ICD-10-CM | POA: Insufficient documentation

## 2023-09-14 DIAGNOSIS — R053 Chronic cough: Secondary | ICD-10-CM | POA: Insufficient documentation

## 2023-09-14 DIAGNOSIS — K219 Gastro-esophageal reflux disease without esophagitis: Secondary | ICD-10-CM | POA: Insufficient documentation

## 2023-09-14 DIAGNOSIS — J343 Hypertrophy of nasal turbinates: Secondary | ICD-10-CM | POA: Insufficient documentation

## 2023-09-14 DIAGNOSIS — J3089 Other allergic rhinitis: Secondary | ICD-10-CM | POA: Insufficient documentation

## 2023-09-14 DIAGNOSIS — Z79899 Other long term (current) drug therapy: Secondary | ICD-10-CM | POA: Insufficient documentation

## 2023-09-14 MED ORDER — BUPIVACAINE (PF) 0.5 % (5 MG/ML) INJECTION SOLUTION
1.0000 mL | Freq: Once | INTRAMUSCULAR | Status: AC
Start: 2023-09-14 — End: 2023-09-15
  Administered 2023-09-15: 1 mL

## 2023-09-14 MED ORDER — TRIAMCINOLONE ACETONIDE 40 MG/ML SUSPENSION FOR INJECTION
40.0000 mg | Freq: Once | INTRAMUSCULAR | 0 refills | Status: AC
Start: 2023-09-14 — End: 2023-09-14

## 2023-09-14 NOTE — Procedures (Signed)
ENT, PHYSICIAN OFFICE CENTER  [address]  [place] [address]  Operated by [facility]  Procedure Note    Name: [name] MRN:  [id number]   Date: 09/14/2023 DOB:  1956/07/05 (67 y.o.)         Nerve Block-ProcDoc    Performed by: Leeanne Deed, MD  Authorized by: Leeanne Deed, MD    Consent:     Consent obtained:  Verbal    Consent given by:  Patient    Risks discussed:  Infection, bleeding, unsuccessful block and intravenous injection    Alternatives discussed:  No treatment, delayed treatment, alternative treatment and referral  Comments:      OTOLARYNGOLOGY PROCEDURE NOTE      DATE OF SERVICE:  09/14/2023   PATIENT NAME:  [name]   DATE OF BIRTH:   1956-07-12       Procedure:     Left Superior Laryngeal Nerve Block  Operator:   McChesney  Anesthesia:    None    Timeout:   Timeout was performed immediately prior to procedure    Consent:   Verbal consent was obtained immediately prior to procedure    Findings:    The patient was seated in the exam chair.  The left neck was prepped with alcohol pad.  Using a 27ga needle, 2 mL of a 50:50 solution of triamcinolone acetonide 40 mg/1 mL and 0.5% bupivacaine were injected along the left thyrohyoid membrane posteriorly.      Patient tolerated the procedure well with no immediate complications.     Leeanne Deed, MD  09/14/2023, 16:21         Leeanne Deed, MD

## 2023-09-14 NOTE — Progress Notes (Signed)
[facility]  DEPARTMENT OF OTOLARYNGOLOGY - HEAD AND NECK SURGERY  CLINIC H&P    Name: [name], 67 y.o. female  MRN: [id number]  Date of Birth: [birth year]  Date of Service: 08/16/2023    Chief Complaint:    Chief Complaint   Patient presents with    Vocal Cord Evaluation       History of Present Illness: [name] is a 67 y.o. female here today for follow up on chronic cough resistant to medical therapy. We discussed last visit laryngeal nerve block. Patient always has more irritation and discomfort on the left side.     On 08/16/2023: Patient was here to follow up on throat and nose problems. Was seen 2 months ago for same problem chronic cough and throat and nose problem.  She is on Prilosec and was started on Pepcid before dinner. Had some improvement but continue to have problem. She also was started on Singulair. She also uses Flonase. Gives her intermittent nose bleed. She does have Barbara comorbidities.     On 06/08/2023: She was here for cough and throat problem. In June had heart attack, for which she had multiple stents put via catheter with cardiac cath. Then she began having severe nonstop coughing. Her symptoms became worse shortly after that and she had to be admitted in the hospital. Workup with multiple chest x-rays, discontinuation of Lisinopril, and different over-the-counter medication without any help. The patient has long lasting history of GERD and she is on Pantoprazole 40 mg in the morning before food. She also has long lasting allergies and she takes Careers adviser and uses Flonase. She has been also on inhaler. The cough is debilitating, not improving. She denies any productive cough, just dry. She was also on 2 different antibiotics for this without improvement.         Past Medical History:  Past Medical History:   Diagnosis Date    Anxiety     Arthritis     Awareness under anesthesia     woke during colonoscopy    Cancer (CMS HCC)     internal skin cancer    Chest  pain 02/19/2016    Chronic pain     lower back    Depression     Dyspnea on exertion     GERD (gastroesophageal reflux disease)     controlled w/med    H/O hearing loss     b/l, no aids    H/O urinary tract infection     Sept 2024- resolved    Headache     Hearing loss     Heart murmur 1979    benign    History of coronary angioplasty with insertion of stent 05/09/2023    Hyperlipidemia LDL goal < 100 08/29/2013    denies    Migraine     MINOR CAD (coronary artery disease) 11/10/2012    no treatment other than cholesterol medications; follows regularly with K.Dalton, PA    Multinodular goiter     Muscle weakness     neck     Myocardial infarction (CMS [facility]) 05/09/2023    04/18/23, sees [place] cards    Neck problem     spinal stenosis    Obesity     Peripheral edema     Rash     eczema    Rheumatic fever     in 1974 no complications    S/P thyroidectomy     Shortness of breath  DOE    Sleep apnea     Squamous cell carcinoma 02/06/2015    right side of neck    Thyroid disorder     s/p part thyroidectomy    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    Tinnitus     Vaginal prolapse 2012    surgery improved significantly    Wears glasses          Past Surgical History:  Past Surgical History:   Procedure Laterality Date    Cataract extraction Right 12/07/2022    Chalazion excision Left 12/22/2021    Colonoscopy  01/06/2009    Gastroscopy  03/05/2010    Gastroscopy with biopsy  03/05/2010    Hx adenoidectomy      Hx ankle fracture tx  2007    Hx appendectomy      Hx cholecystectomy      Hx colonoscopy      Hx cystocele repair  09/24/2009    Hx hand surgery  2012    Hx heart catheterization      Hx hysterectomy      Hx oophorectomy      Hx parotidectomy       Hx partial thyroidectomy Right 04/05/2019    Hx tonsillectomy      Hx total vaginal hysterectomy  1979    Hx wisdom teeth extraction      Hx wrist fracture tx Left 2007    Parathyroid gland surgery      Pars plana vitrectomy Right 11/02/2021    Pb revise ulnar nerve  at elbow Left 1979    Pb upper gi endoscopy,biopsy  12/19/2007    Septoplasty       Medications:  Outpatient Medications Marked as Taking for the 09/14/23 encounter (Office Visit) with Leeanne Deed, MD   Medication Sig    albuterol sulfate (PROAIR HFA) 90 mcg/actuation Inhalation oral inhaler Take 2 Puffs by inhalation Every 4 hours as needed    aspirin 81 mg Oral Tablet, Chewable Chew 1 Tablet (81 mg total) Every morning    baclofen (LIORESAL) 5 mg Oral Tablet Take 1 Tablet (5 mg total) by mouth Three times a day as needed    BRILINTA 90 mg Oral Tablet Take 1 Tablet (90 mg total) by mouth Twice daily    clobetasoL (TEMOVATE) 0.05 % Ointment Apply topically Twice daily    desvenlafaxine (PRISTIQ) 100 mg Oral Tablet Sustained Release 24 hr Take 1 Tablet (100 mg total) by mouth Once a day (Patient taking differently: Take 1 Tablet (100 mg total) by mouth Every morning)    diclofenac sodium (VOLTAREN) 1 % Gel Apply topically Four times a day - before meals and bedtime (Patient taking differently: Apply topically Four times a day as needed)    famotidine (PEPCID) 40 mg Oral Tablet Take 1 Tablet (40 mg total) by mouth Twice daily    fluticasone propionate (FLONASE) 50 mcg/actuation Nasal Spray, Suspension INSTILL 2 SPRAYS INTO EACH NOSTRIL ONCE A DAY (Patient taking differently: 2 Sprays Every morning)    gabapentin (NEURONTIN) 800 mg Oral Tablet Take 1 Tablet (800 mg total) by mouth Twice daily    metoprolol succinate (TOPROL-XL) 25 mg Oral Tablet Sustained Release 24 hr Take 1 Tablet (25 mg total) by mouth Once a day (Patient taking differently: Take 1 Tablet (25 mg total) by mouth Every evening)    montelukast (SINGULAIR) 10 mg Oral Tablet Take 1 Tablet (10 mg total) by mouth Every evening    mupirocin (BACTROBAN) 2 %  Ointment Apply to each nostril Once a day for 30 days    ondansetron (ZOFRAN) 4 mg Oral Tablet Take 1 Tablet (4 mg total) by mouth Every 8 hours as needed for Nausea/Vomiting    pantoprazole (PROTONIX)  40 mg Oral Tablet, Delayed Release (E.C.) Take 1 Tablet (40 mg total) by mouth Twice daily    rosuvastatin (CRESTOR) 20 mg Oral Tablet Take 1 Tablet (20 mg total) by mouth Every evening    solifenacin (VESICARE) 10 mg Oral Tablet Take 1 Tablet (10 mg total) by mouth Once a day    tiotropium bromide (SPIRIVA RESPIMAT) 2.5 mcg/actuation Inhalation oral inhaler Take 2 Inhalations (2 Puffs total) by inhalation Once a day (Patient taking differently: Take 2 Inhalations (2 Puffs total) by inhalation Every evening)    triamcinolone acetonide (KENALOG-40) 40 mg/mL Injection Suspension Inject 1 mL (40 mg total) into the muscle One time for 1 dose    umeclidinium (INCRUSE ELLIPTA) 62.5 mcg/actuation Inhalation oral inhaler Take 1 Inhalation by inhalation Once a day      Family History:  Family Medical History:       Problem Relation (Age of Onset)    Bipolar Disorder Daughter, Paternal Uncle    Breast Cancer Paternal Aunt, Paternal Aunt, Paternal Aunt, Paternal Aunt, Paternal Aunt, Other    Cancer Paternal Aunt, Paternal Aunt, Paternal Aunt, Paternal Aunt, Paternal Aunt, Other (63)    Congestive Heart Failure Father (50)    Coronary Artery Disease Father, Mother (29)    Diabetes Mother    Heart Attack Father, Sister    High Cholesterol Mother    Hypertension (High Blood Pressure) Mother, Brother, Sister, Sister    Kidney Disease Sister    Leukemia Paternal Uncle    Stroke Paternal Grandmother    Thyroid Disease Sister            Social History:  Social History     Occupational History    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     Employer: NOT EMPLOYED     Comment: occasionally   Tobacco Use    Smoking status: Former     Current packs/day: 0.00     Average packs/day: 2.0 packs/day for 40.0 years (80.0 ttl pk-yrs)     Types: Cigarettes     Start date: 01/22/1965     Quit date: 01/22/2005     Years since quitting: 18.6    Smokeless tobacco: Never   Vaping Use    Vaping status: Never Used   Substance and Sexual Activity    Alcohol  use: No     Alcohol/week: 0.0 standard drinks of alcohol    Drug use: No    Sexual activity: Yes     Partners: Male     Allergies:  Allergies   Allergen Reactions    Cymbalta [Duloxetine]      Rash on feet and hands    Blue Dye      Told to avoid due to patch test result by [facility]    Flavoring Agent  Other Adverse Reaction (Add comment)     Patient unsure of reaction    Lipitor [Atorvastatin] Myalgia     Muscle pain    Lisinopril  Other Adverse Reaction (Add comment)     Cough      Nickel      Blisters  "Allergic to all kinds of metal"     Codeine Itching     RASH    Mobic [Meloxicam] Nausea/ Vomiting  Nervous and shakes     Naprosyn [Naproxen] Nausea/ Vomiting    Nsaids (Non-Steroidal Anti-Inflammatory Drug) Nausea/ Vomiting       Review of Systems:      Const: Denies   Eyes: Denies   ENMT: Denies  See HPI.  CV: Denies   Resp: Denies  GI: Denies   Musculo: Denies   Skin: Denies   Neuro: Denies   Psych: Denies   Endocrine: Denies   Hema/Lymph: Denies   Reviewed and updated                                                          All other systems reviewed and found to be negative.    Physical Exam:    BP (Non-Invasive): 120/80 Temperature: 36.4 C (97.5 F) Heart Rate: 80      Height: 165.1 cm (5\' 5" ) Weight: 95.1 kg (209 lb 10.5 oz) Body mass index is 34.89 kg/m.      Const: Appears healthy and well developed. No signs of apparent distress present. Communicates normally.  Head/face: Normal on inspection. Palpation reveals no bilateral sinus tenderness/tenderness forehead/tenderness cheeks. Salivary glands normal in size and texture. Facial strength normal.  House-Brackmann Grading: Grade I: normal facial function in all areas  Eyes: EOMI in both eyes and normal gaze bilaterally. Orbits exhibit normal position.  ENMT:  Ears: External ears WNL. Auditory canals normal. Bilateral tympanic membranes are normal. Auditory acuity: Rinne, R:/L: and Weber. Normal to whispered voice.   Nose: External nose WNL. Internal  nose: Nasal mucosal swelling. There are secretions. Septum with moderate sepal deviation, s-shaped. Inferior turbinates are hypertrophied. See nasal endoscopy.  Nasopharynx: Gag  Oral Cavity: Oral aperture is normal. Lips: Appear normal and healthy. Dentition is in good repair. Occlusion is normal. Gums appear healthy. Tongue appears normal. Oral mucosa moist with no thrush or mucositis.  Oropharynx: Appears normal. Soft palate normal in appearance. Tonsils appear normal. Posterior pharynx is normal.  Hypopharynx: Gag  Larynx: Gag  Neck: Normal to inspection. Unremarkable on palpation. No masses appreciated. Trachea midline. Thyroid is normal in size and texture.  Resp: Normal respiratory activities. No wheezing. Clear to auscultation bilaterally.  CV: Rate is regular. Rhythm is regular. No heart murmur appreciated.  Abdomen: Abdomen is soft, nontender, and nondistended.  Lymph: No visible or palpable anterior or posterior cervical lymphadenopathy.  Musculo: Walks with a normal gait. Temporomandibular joint: No obvious instability. No crepitus or tenderness bilaterally.  Skin: Skin is warm and dry.  Neuro: Mood is normal.  Cranial nerves: Cranial nerves I-XII intact.  Psych: Mood/Affect: Mood is normal. Affect is normal. Cognition: Orientation is intact.    Review of Information:    Pathology: No results found for this or any previous visit (from the past 720 hour(s)).   Imaging:   Recent Results (from the past 720 hour(s))   XR SHOULDER LEFT     Status: None    Narrative    [name]  Female, 67 years old.    XR SHOULDER LEFT performed on 08/16/2023 3:42 PM.    REASON FOR EXAM:  [email or web address] [email or web address]: Fall from standing    TECHNIQUE: 4 views/4 images submitted for interpretation.    COMPARISON:  None    FINDINGS:    There is no acute fracture traumatic malalignment of the  left shoulder. The joint spaces are preserved. The acromioclavicular joint is unremarkable. There is no significant soft tissue abnormality.  Visualized lung fields are clear.      Impression    No acute osseous abnormality of the left shoulder.   XR FOOT RIGHT     Status: None    Narrative    [name]  Female, 67 years old.    XR FOOT RIGHT performed on 08/16/2023 3:46 PM.    REASON FOR EXAM:  [email or web address].Lorne Skeens: Fall from standing    TECHNIQUE: 3 views/3 images submitted for interpretation.    COMPARISON:  Foot radiograph dated 04/10/2021    FINDINGS:    No acute fracture or traumatic malalignment of the right foot. There is no significant soft tissue swelling. The joint spaces are preserved. An Achilles enthesophyte is noted.      Impression    No acute osseous abnormality of the right foot.        Procedure:  ENT, PHYSICIAN OFFICE CENTER  [address]  [place] [address]  Operated by [facility]  Procedure Note    Name: [name] MRN:  [id number]   Date: 09/14/2023 DOB:  1956-07-09 (67 y.o.)         Nerve Block-ProcDoc    Performed by: Leeanne Deed, MD  Authorized by: Leeanne Deed, MD    Consent:     Consent obtained:  Verbal    Consent given by:  Patient    Risks discussed:  Infection, bleeding, unsuccessful block and intravenous injection    Alternatives discussed:  No treatment, delayed treatment, alternative treatment and referral  Comments:      OTOLARYNGOLOGY PROCEDURE NOTE      DATE OF SERVICE:  09/14/2023   PATIENT NAME:  [name]   DATE OF BIRTH:   1956/06/08       Procedure:     Left Superior Laryngeal Nerve Block  Operator:   McChesney  Anesthesia:    None    Timeout:   Timeout was performed immediately prior to procedure    Consent:   Verbal consent was obtained immediately prior to procedure    Findings:    The patient was seated in the exam chair.  The left neck was prepped with alcohol pad.  Using a 27ga needle, 2 mL of a 50:50 solution of triamcinolone acetonide 40 mg/1 mL and 0.5% bupivacaine were injected along the left thyrohyoid membrane posteriorly.      Patient tolerated the procedure well  with no immediate complications.     Leeanne Deed, MD  09/14/2023, 16:21         Leeanne Deed, MD    Working Diagnosis:      ICD-10-CM    1. Chronic cough  R05.3 Nerve Block-ProcDoc      2. Other allergic rhinitis  J30.89       3. DNS (deviated nasal septum)  J34.2       4. Long term (current) use of anticoagulants  Z79.01       5. Laryngopharyngeal reflux (LPR)  K21.9                 Assessment and Plan:    [name] is a 67 y.o. female:     Due to unknown cause of her cough; and failed maximum medical therapy.    Continue Pantoprazole 40 mg in the morning and Pepcid 40 mg before dinner.    Continue Mupirocin ointment to  help reduce nose bleeding.    Flonase as needed.    Performed superior laryngeal nerve block, tolerated well.   Follow up in 3 months and as needed.       Orders Placed This Encounter    Nerve Block-ProcDoc    BUPivacaine PF (MARCAINE) 0.5% injection    triamcinolone acetonide (KENALOG-40) 40 mg/mL Injection Suspension           Leeanne Deed, MD     09/14/2023 16:21     Leeanne Deed, MD    CC:    PCP Ovidio Hanger, PA-C  [address]  [facility] [address]   Referring Provider Ovidio Hanger, PA-C  [address]  [place],  [place] [address]

## 2023-09-23 LAB — AFB CULTURE WITH STAIN
AFB CULTURE: NO GROWTH
AFB CULTURE: NO GROWTH
AFB SMEAR: NEGATIVE
AFB SMEAR: NEGATIVE

## 2023-09-27 ENCOUNTER — Ambulatory Visit (INDEPENDENT_AMBULATORY_CARE_PROVIDER_SITE_OTHER): Payer: Medicare (Managed Care) | Admitting: Chiropractor

## 2023-09-27 ENCOUNTER — Other Ambulatory Visit: Payer: Self-pay

## 2023-09-27 DIAGNOSIS — M9902 Segmental and somatic dysfunction of thoracic region: Secondary | ICD-10-CM

## 2023-09-27 DIAGNOSIS — M9903 Segmental and somatic dysfunction of lumbar region: Secondary | ICD-10-CM

## 2023-09-27 DIAGNOSIS — M9901 Segmental and somatic dysfunction of cervical region: Secondary | ICD-10-CM

## 2023-09-27 DIAGNOSIS — M791 Myalgia, unspecified site: Secondary | ICD-10-CM

## 2023-09-27 DIAGNOSIS — M5090 Cervical disc disorder, unspecified, unspecified cervical region: Secondary | ICD-10-CM

## 2023-09-27 DIAGNOSIS — M9904 Segmental and somatic dysfunction of sacral region: Secondary | ICD-10-CM

## 2023-09-27 NOTE — Progress Notes (Signed)
[facility]  [address]  [place] [address]  [phone]        Name: [name]  MRN: [id number]  Date:09/27/2023  Birthday: 11-11-1955      Chief Complaint   Patient presents with    Neck Pain    Back Pain    Myalgia         Subjective:  [name] presents for follow up care. She states that her back is pretty stiff and sore this morning. She states that it bothers her when she has to stand while cooking or doing dishes. She states that it does not feel too bad while she is sitting.    Objective:       [place] Pain Rating Scale     On a scale of 0-10, during the past 24 hours, pain has interfered with you usual activity: 7    On a scale of 0-10, during the past 24 hours, pain has interfered with your sleep: 7    On a scale of 0-10, during the past 24 hours, pain has affected your mood: 7    On a scale of 0-10, during the past 24 hours, pain has contributed to your stress: 7    On a scale of 0-10, what is your overall pain Rating: 7    Cervical ROM: Decreased.  Lumbar ROM: Decreased.  Joint mobility restrictions: C1-C2, C7-T1, T4-T5, T5-T6, L5-S1, right upper sacroliliac  Tender points/Trigger points: Cervical paraspinals/ upper traps and lumbar paraspinals.  Hypertonicity: Cervical paraspinals/ upper traps and lumbar paraspinals.      Assessment:       ICD-10-CM    1. Segmental dysfunction of cervical region  M99.01       2. 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 Segmental dysfunction of thoracic region  M99.02       4. Segmental dysfunction of lumbar region  M99.03       5. Segmental dysfunction of sacral region  M99.04       6. Myalgia  M79.10           Plan:  Continue with care as outlined in the treatment plan:      Today's treatment:    Chiropractic manipulation of T4,T5, L5 and the right SI with drop piece to improve joint function and mobility.   Pt tolerated today's visit well.    CPT Code: 41660.    Arlyn Leak,  DC

## 2023-09-29 ENCOUNTER — Encounter (HOSPITAL_COMMUNITY): Payer: MEDICAID | Admitting: Student in an Organized Health Care Education/Training Program

## 2023-10-04 ENCOUNTER — Other Ambulatory Visit: Payer: Self-pay

## 2023-10-04 ENCOUNTER — Ambulatory Visit: Payer: Medicare (Managed Care) | Attending: Cardiovascular Disease | Admitting: CARDIOVASCULAR DISEASE

## 2023-10-04 ENCOUNTER — Encounter (HOSPITAL_COMMUNITY): Payer: Self-pay | Admitting: CARDIOVASCULAR DISEASE

## 2023-10-04 VITALS — BP 118/78 | HR 79 | Temp 97.1°F | Ht 65.0 in | Wt 209.0 lb

## 2023-10-04 DIAGNOSIS — I10 Essential (primary) hypertension: Secondary | ICD-10-CM | POA: Insufficient documentation

## 2023-10-04 DIAGNOSIS — Z955 Presence of coronary angioplasty implant and graft: Secondary | ICD-10-CM

## 2023-10-04 DIAGNOSIS — E785 Hyperlipidemia, unspecified: Secondary | ICD-10-CM

## 2023-10-04 DIAGNOSIS — I2510 Atherosclerotic heart disease of native coronary artery without angina pectoris: Secondary | ICD-10-CM | POA: Insufficient documentation

## 2023-10-04 DIAGNOSIS — Z87891 Personal history of nicotine dependence: Secondary | ICD-10-CM

## 2023-10-04 MED ORDER — AMLODIPINE 2.5 MG TABLET
2.5000 mg | ORAL_TABLET | Freq: Every day | ORAL | 4 refills | Status: DC
Start: 2023-10-04 — End: 2023-12-02

## 2023-10-04 NOTE — Progress Notes (Signed)
[facility]  [address]  [place] [address]  Phone: [phone]  Fax: [phone]    PATIENT NAME: [name] NUMBER: [id number]  DATE OF SERVICE: 10/14/23  DATE OF BIRTH: June 19, 1956  Age: 67 y.o.    Requesting Physician: No ref. provider found     NEWPatient Clinic Visit     Chief Concern     Chief Complaint   Patient presents with    Follow Up       History of Presenting Illness/Subjective     67 year old female with coronary artery disease s/p recent PCI x3 w/ DES x2 to LAD & x1 to Lcx, dyslipidemia, GERD. Patient presents to clinic today to establish care.     Patient was recently admitted to [facility] general with complaints of chest pain. She had ruled out for ACS. She did undergo a cardiac catheterization on 04/18/2023 for which she received PCI with stent placement to the LAD and left circumflex. She was discharged home on aspirin and Brilinta. The patient tells me that she felt good for approximately 2 weeks after her discharge. Over the last few months she has developed a persistent cough. Patient medications were switched with stoppage of her ACE/ARB but she continues to have cough. She is on many inhalers but doesn't see pulmonary.    The patient tells me she was evaluated by her PCP who ordered a chest x-ray which was unremarkable at that time. She did also have a WBC elevated by her report. She has not had a follow-up since then. She does tell me over the past several days her symptoms have progressively worsened.       Problem List                              Patient Active Problem List   Diagnosis    Migraine Headaches    GERD not well controlled    Patellofemoral pain syndrome    Multinodular goiter    Microscopic hematuria    Neck pain    Lichen sclerosus et atrophicus of the vulva    Carpal tunnel syndrome on right    Osteoarthritis, knee    Left knee pain    Chest pain at rest    MINOR CAD (coronary artery disease)    Hyperlipidemia LDL goal <  100    Otitis media of both ears    Mass of parotid gland, right    Squamous cell carcinoma    Chest pain    Dysuria    Multiple pulmonary nodules determined by computed tomography of lung    History of rheumatic fever    Neuropathy (CMS HCC)    PTSD (post-traumatic stress disorder)    Pain due to neuropathy of facial nerve    Lung nodule < 6cm on CT    S/P thyroidectomy    GAD (generalized anxiety disorder)    Severe episode of recurrent major depressive disorder, 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)    Right hip pain    Hand pain    Cervical neck pain with evidence of disc disease    Lumbar pain with radiation down right leg    History of coronary angioplasty with insertion of stent    Myocardial infarction (CMS HCC)    Severe obesity (BMI 35.0-35.9 with comorbidity) (CMS HCC)    Shoulder pain       Past Medical History  Current Outpatient Medications   Medication Sig    albuterol sulfate (PROAIR HFA) 90 mcg/actuation Inhalation oral inhaler Take 2 Puffs by inhalation Every 4 hours as needed    amLODIPine (NORVASC) 2.5 mg Oral Tablet Take 1 Tablet (2.5 mg total) by mouth Once a day (Patient not taking: Reported on 10/12/2023)    aspirin 81 mg Oral Tablet, Chewable Chew 1 Tablet (81 mg total) Every morning    baclofen (LIORESAL) 5 mg Oral Tablet Take 1 Tablet (5 mg total) by mouth Three times a day as needed    BRILINTA 90 mg Oral Tablet Take 1 Tablet (90 mg total) by mouth Twice daily    clobetasoL (TEMOVATE) 0.05 % Ointment Apply topically Twice daily    desvenlafaxine (PRISTIQ) 100 mg Oral Tablet Sustained Release 24 hr Take 1 Tablet (100 mg total) by mouth Once a day    diclofenac sodium (VOLTAREN) 1 % Gel Apply topically Four times a day - before meals and bedtime (Patient taking differently: Apply topically Four times a day as needed)    famotidine (PEPCID) 40 mg Oral Tablet Take 1 Tablet (40 mg total) by mouth Twice daily    fluticasone propionate (FLONASE) 50 mcg/actuation Nasal Spray, Suspension INSTILL  2 SPRAYS INTO EACH NOSTRIL ONCE A DAY (Patient taking differently: 2 Sprays Every morning)    gabapentin (NEURONTIN) 800 mg Oral Tablet Take 1 Tablet (800 mg total) by mouth Twice daily    ipratropium-albuterol 0.5 mg-3 mg(2.5 mg base)/3 mL Solution for Nebulization Take 3 mL by nebulization Every 6 hours as needed for Wheezing for up to 30 days    isosorbide mononitrate (IMDUR) 30 mg Oral Tablet Sustained Release 24 hr 1 Tablet (30 mg total)    metoprolol succinate (TOPROL-XL) 25 mg Oral Tablet Sustained Release 24 hr Take 1 Tablet (25 mg total) by mouth Once a day (Patient taking differently: Take 1 Tablet (25 mg total) by mouth Every evening)    montelukast (SINGULAIR) 10 mg Oral Tablet Take 1 Tablet (10 mg total) by mouth Every evening    nitroGLYCERIN (NITROSTAT) 0.4 mg Sublingual Tablet, Sublingual DISSOLVE 1 TABLET UNDER THE TONGUE AS NEEDED FOR CHEST PAIN EVERY 5 MINUTES UP TO 3 TIMES. IF NO RELIEF CALL 911.    ondansetron (ZOFRAN) 4 mg Oral Tablet Take 1 Tablet (4 mg total) by mouth Every 8 hours as needed for Nausea/Vomiting (Patient not taking: Reported on 10/04/2023)    pantoprazole (PROTONIX) 40 mg Oral Tablet, Delayed Release (E.C.) Take 1 Tablet (40 mg total) by mouth Twice daily    rosuvastatin (CRESTOR) 20 mg Oral Tablet Take 1 Tablet (20 mg total) by mouth Every evening    solifenacin (VESICARE) 10 mg Oral Tablet Take 1 Tablet (10 mg total) by mouth Once a day    tiotropium bromide (SPIRIVA RESPIMAT) 2.5 mcg/actuation Inhalation oral inhaler Take 2 Inhalations (2 Puffs total) by inhalation Once a day (Patient taking differently: Take 2 Inhalations (2 Puffs total) by inhalation Every evening)    umeclidinium (INCRUSE ELLIPTA) 62.5 mcg/actuation Inhalation oral inhaler Take 1 Inhalation by inhalation Once a day     Allergies   Allergen Reactions    Cymbalta [Duloxetine]      Rash on feet and hands    Blue Dye      Told to avoid due to patch test result by [employer]  Other Adverse  Reaction (Add comment)     Patient unsure of reaction    Lipitor [  Atorvastatin] Myalgia     Muscle pain    Lisinopril  Other Adverse Reaction (Add comment)     Cough      Nickel      Blisters  "Allergic to all kinds of metal"     Codeine Itching     RASH    Mobic [Meloxicam] Nausea/ Vomiting     Nervous and shakes     Naprosyn [Naproxen] Nausea/ Vomiting    Nsaids (Non-Steroidal Anti-Inflammatory Drug) Nausea/ Vomiting     Past Medical History:   Diagnosis Date    Anxiety     Arthritis     Awareness under anesthesia     woke during colonoscopy    Cancer (CMS HCC)     internal skin cancer    Chest pain 02/19/2016    Chronic pain     lower back    Depression     Dyspnea on exertion     GERD (gastroesophageal reflux disease)     controlled w/med    H/O hearing loss     b/l, no aids    H/O urinary tract infection     Sept 2024- resolved    Headache     Hearing loss     Heart murmur 1979    benign    History of coronary angioplasty with insertion of stent 05/09/2023    Hyperlipidemia LDL goal < 100 08/29/2013    denies    Migraine     MINOR CAD (coronary artery disease) 11/10/2012    no treatment other than cholesterol medications; follows regularly with K.Dalton, PA    Multinodular goiter     Muscle weakness     neck     Myocardial infarction (CMS [facility]) 05/09/2023    04/18/23, sees [facility]    Neck problem     spinal stenosis    Obesity     Peripheral edema     Rash     eczema    Rheumatic fever     in 1974 no complications    S/P thyroidectomy     Shortness of breath     DOE    Sleep apnea     Squamous cell carcinoma 02/06/2015    right side of neck    Thyroid disorder     s/p part thyroidectomy    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    Tinnitus     Vaginal prolapse 2012    surgery improved significantly    Wears glasses          Past Surgical History:   Procedure Laterality Date    CATARACT EXTRACTION Right 12/07/2022    CHALAZION EXCISION Left 12/22/2021    and conj lesion excision from caruncle     COLONOSCOPY  01/06/2009    COLONOSCOPY performed by Jan Fireman, AHMED F at [facility] OR ENDO    GASTROSCOPY  03/05/2010    GASTROSCOPY performed by Boyd Kerbs, SWATI at [facility] OR ENDO    GASTROSCOPY WITH BIOPSY  03/05/2010    GASTROSCOPY WITH BIOPSY performed by Knox Saliva at [facility] OR ENDO    HX ADENOIDECTOMY      HX ANKLE FRACTURE TX  2007    left distal fibula, casted    HX APPENDECTOMY      HX CHOLECYSTECTOMY      HX COLONOSCOPY      HX CYSTOCELE REPAIR  09/24/2009    HX HAND SURGERY  2012    for Carpal tunnel : right side treated  HX HEART CATHETERIZATION      HX HYSTERECTOMY      HX OOPHORECTOMY      left ovary removed    HX PAROTIDECTOMY       "surgical clamp"     HX PARTIAL THYROIDECTOMY Right 04/05/2019    R hemithyroidectomy for nodule, final path follicular adenoma    HX TONSILLECTOMY      HX TOTAL VAGINAL HYSTERECTOMY  1979    HX WISDOM TEETH EXTRACTION      HX WRIST FRACTURE TX Left 2007    FOOSH injury    PARATHYROID GLAND SURGERY      PARS PLANA VITRECTOMY Right 11/02/2021    Pars plana vitrectomy, nternal limiting membrane (ILM) and epiretinal membrane (ERM) peel, Injection of 20%  sf6 gas, Subtenon's injection of Cefuroxime 50 mg and Dexamethasone 2 mg    PB REVISE ULNAR NERVE AT ELBOW Left 1979    PB UPPER GI ENDOSCOPY,BIOPSY  12/19/2007    patulous GE junction zone, erythema, nonerosive GERD    SEPTOPLASTY             Family History     Family Medical History:       Problem Relation (Age of Onset)    Bipolar Disorder Daughter, Paternal Uncle    Breast Cancer Paternal Aunt, Paternal Aunt, Paternal Aunt, Paternal Aunt, Paternal Aunt, Other    Cancer Paternal Aunt, Paternal Aunt, Paternal Aunt, Paternal Aunt, Paternal Aunt, Other (56)    Congestive Heart Failure Father (50)    Coronary Artery Disease Father, Mother (55)    Diabetes Mother    Heart Attack Father, Sister    High Cholesterol Mother    Hypertension (High Blood Pressure) Mother, Brother, Sister, Sister    Kidney Disease Sister    Leukemia Paternal Uncle     Stroke Paternal Grandmother    Thyroid Disease Sister              Social History        Social History     Socioeconomic History    Marital status: Divorced    Number of children: 3   Occupational History    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     Employer: NOT EMPLOYED     Comment: occasionally   Tobacco Use    Smoking status: Former     Current packs/day: 0.00     Average packs/day: 2.0 packs/day for 40.0 years (80.0 ttl pk-yrs)     Types: Cigarettes     Start date: 01/22/1965     Quit date: 01/22/2005     Years since quitting: 18.7    Smokeless tobacco: Never   Vaping Use    Vaping status: Never Used   Substance and Sexual Activity    Alcohol use: No     Alcohol/week: 0.0 standard drinks of alcohol    Drug use: No    Sexual activity: Yes     Partners: Male   Other Topics Concern    Uses Cane No    Uses walker No    Uses wheelchair No    Right hand dominant Yes    Left hand dominant No    Ambidextrous No    Ability to Walk 1 Flight of Steps without SOB/CP Yes    Routine Exercise No    Ability to Walk 2 Flight of Steps without SOB/CP No     Comment: SOB    Ability To Do Own ADL's Yes    Uses Walker No  Other Activity Level Yes     Comment: housework     Uses Cane No    Shift Work No    Unusual Sleep-Wake Schedule No   Social History Narrative    Right handed.        Review Of Systems      All review of systems negative except as otherwise noted in the HPI.    Physical Examination     BP 118/78 (Site: Left Arm)   Pulse 79   Temp 36.2 C (97.1 F)   Ht 1.651 m (5\' 5" )   Wt 94.8 kg (208 lb 15.9 oz)   SpO2 96%   BMI 34.78 kg/m         GENERAL: Patient is alert oriented to time, place and person. No acute distress  HEENT:  Head is normocephalic, atraumatic.  Extraocular motions are intact.  Sclerae are nonicteric.   NECK:  Supple, without JVD or carotid bruits.   LUNGS:  Breath sounds are clear to auscultation bilaterally.   CARDIOVASCULAR:  Reveals a regular rate and rhythm with normal S1 and S2. No murmur, rub  or gallop present  ABDOMEN:  Soft and non-distended with no visceromegaly.  LOWER EXTREMITIES:  Appear well-perfused without edema.    Transthoracic Echocardiogram     Results for orders placed or performed during the hospital encounter of 02/19/16   TRANSTHORACIC ECHOCARDIOGRAM - ADULT    Narrative     Left ventricle: LV wall thickness is mildly increased. Concentric   hypertrophy present. The systolic function is normal with an ejection   fraction range of 55% - 60%. LV diastolic function is normal. There is no   evidence of increased ventricular filling pressure by Doppler parameters.        *Note: Due to a large number of results and/or encounters for the requested time period, some results have not been displayed. A complete set of results can be found in Results Review.     Left ventricle: EF 55-60%  Right ventricle: The cavity size is dilated. Wall thickness is normal. Systolic function is reduced. Systolic pressure is within the normal range. The estimated peak pressure is 30 mm Hg assuming that the right atrial pressure was 8 mmlg.  Left atrium: The atrium is normal in size.  Atrial septum: The septum was structurally nornal.  Right atrium: The atrium is normal in size.  Mitral valve: The valve is structurally normal. No stenosis. There is trivial regurgitation. The mean diastolic gradient is 2 mm Hg. The valve area (LVOT continuity) is 2.3 cm?.  Tricuspid valve: The valve is structurally normal. No stenosis. There is trivial regurgitation. The peak diastolic gradient is 2 mm Hg.  Aortic valve: The valve is structurally normal. The valve is trileaffet. Cusp separation is normal. Velocity is within the normal range. There is no stenosis. There is no regurgitation. The mean systolic gradient is 4 mm Hg. The LVOT to aortic valve VTI ratio is 0.66. The valve area by the velocity-time integral method is 2.0 cm*.  Pulmonic valve: Not weil visualized. No stenosis. There is no significant regurgitation. The peak  systolic gradient is 4 mm      Percutaneous coronary intervention with stent placement of left anterior descending using a 3.0  40 overlapped with x a 3.0 x 13 [place] stents.  5. Percutaneous coronary intervention with stent placement of left circumflex using a 3.0 x 18 [place] stent.  6. Intravascular ultrasound of left anterior descending.  7.  Intravascular ultrasound of left circumflex.    Labs     The ASCVD Risk score (Arnett DK, et al., 2019) failed to calculate for the following reasons:    The patient has a prior MI or stroke diagnosis    Lab Results   Component Value Date    TRIG 134 05/20/2023    HDLCHOL 45 (L) 05/20/2023    LDLCHOL 99 05/20/2023    CHOLESTEROL 168 05/20/2023        Lab Results   Component Value Date    SODIUM 140 07/13/2023    POTASSIUM 4.3 07/13/2023    BUN 9 07/13/2023    CREATININE 1.09 (H) 07/13/2023    GFR 56 (L) 07/13/2023    GLUCOSENF 101 03/05/2019    GLUCOSEFAST 104 08/29/2013         Assessment & Plan       67 yo F w/ Hx of CAD s/p PCI x3 to LAD & Lcx who presents to clinic for establishing care.    Impression:    #CAD s/p PCI x3 w/ DES x2 to LAD & x1 to the Lcx  #HTN  #HLD  #Elevated BMI at 35.44  #Cough      Plan:    - Agree w/ OMT w/ DAPT & BB. DAPT for 12 months at least f/b monotherapy  - Obtain TTE  - RTC in 3 months        Orders Placed This Encounter    TRANSTHORACIC ECHOCARDIOGRAM - ADULT    amLODIPine (NORVASC) 2.5 mg Oral Tablet         Return in about 3 months (around 01/02/2024).        / Nicky Pugh, MD  Cardiology Fellow, PGY-V  Division of Cardiovascular Medicine  Department of Medicine  [facility]  Pager - [phone]      Late entry for 10/04/2023.  I saw and examined the patient.  I reviewed the fellow's note.  I agree with the findings and plan of care as documented in the fellow's note.  Any exceptions/additions are edited/noted.    Leodis Liverpool, MD

## 2023-10-06 ENCOUNTER — Other Ambulatory Visit: Payer: Self-pay

## 2023-10-06 ENCOUNTER — Ambulatory Visit (INDEPENDENT_AMBULATORY_CARE_PROVIDER_SITE_OTHER): Payer: Medicare (Managed Care) | Admitting: Student in an Organized Health Care Education/Training Program

## 2023-10-06 VITALS — BP 133/73 | HR 65 | Resp 18 | Ht 65.0 in | Wt 212.1 lb

## 2023-10-06 DIAGNOSIS — F332 Major depressive disorder, recurrent severe without psychotic features: Secondary | ICD-10-CM

## 2023-10-06 DIAGNOSIS — F4310 Post-traumatic stress disorder, unspecified: Secondary | ICD-10-CM

## 2023-10-06 DIAGNOSIS — F411 Generalized anxiety disorder: Secondary | ICD-10-CM

## 2023-10-06 DIAGNOSIS — Z8659 Personal history of other mental and behavioral disorders: Secondary | ICD-10-CM

## 2023-10-06 MED ORDER — DESVENLAFAXINE SUCCINATE ER 100 MG TABLET,EXTENDED RELEASE 24 HR
100.0000 mg | ORAL_TABLET | Freq: Every day | ORAL | 2 refills | Status: DC
Start: 2023-10-06 — End: 2024-03-09
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
## 2023-10-11 ENCOUNTER — Telehealth (HOSPITAL_BASED_OUTPATIENT_CLINIC_OR_DEPARTMENT_OTHER): Payer: Self-pay | Admitting: Physician Assistant

## 2023-10-11 ENCOUNTER — Ambulatory Visit (INDEPENDENT_AMBULATORY_CARE_PROVIDER_SITE_OTHER): Payer: MEDICAID | Admitting: Rehabilitative and Restorative Service Providers"

## 2023-10-11 ENCOUNTER — Encounter (HOSPITAL_COMMUNITY): Payer: Self-pay | Admitting: CARDIOVASCULAR DISEASE

## 2023-10-11 NOTE — Nursing Note (Signed)
Transition of Care Contact Information  Discharge Date: 10/07/2023  Transition Facility Type--Hospital (Inpatient or Observation)  Facility [facility]  Interactive Contact(s): Completed or attempted contact indicated by Date/Time  Completed Contact: 10/11/2023 11:26 AM  Contact Method(s)-- Patient/Caregiver Telephone  Clinical Staff Name/Role who Verdon Cummins, RN  Transition Assessment  Discharge Summary obtained?--Yes  How are you recovering?  Discharge Meds obtained?--Yes  Discharge medication changes reviewed?--Yes  Full Medication Reconciliation Completed?  Medication understanding --knows new medication(s)--knows purpose of medication  Medication Concerns?--No  Have everything needed for recovery?--Yes  Care Coordination:   Patient has transition follow-up appointment date and time?--Yes  Follow up appointment date:--10/12/2023  Specialist Transition Visit planned?  Specialist Transition Visit date:  Patient/caregiver plans to attend transition visit?--Yes  Primary Follow-up Barrier  Interventions provided --reinforced discharge instructions--follow-up appointment date/time reinforced  Home Health or DME ordered at discharge?--No  Clinician/Team notified?--No  Primary reason clinician notified?  Transition Note:Spoke to patient regarding h/d f/u for Unstable Angina on 12/12-12/13/2024 at [facility].    Patient reports that she is having constant headaches (she thinks because of new medication: Isosorbide). She denies any chest pains, shortness of breath, dizziness, nausea, vomiting or vision changes.  Eating and drinking without any difficulty.  No issues with BM's or urination.      F/U appointment scheduled on 10/12/2023 with Dr. Cliffton Asters.  Encouraged patient to call with any questions or concerns.  ED precautions reviewed.    Myna Bright, RN

## 2023-10-12 ENCOUNTER — Other Ambulatory Visit: Payer: Self-pay

## 2023-10-12 ENCOUNTER — Encounter (HOSPITAL_BASED_OUTPATIENT_CLINIC_OR_DEPARTMENT_OTHER): Payer: Self-pay | Admitting: Rehabilitative and Restorative Service Providers"

## 2023-10-12 ENCOUNTER — Ambulatory Visit
Payer: Medicare (Managed Care) | Attending: Family Medicine | Admitting: Rehabilitative and Restorative Service Providers"

## 2023-10-12 VITALS — BP 112/70 | HR 82 | Temp 97.4°F | Ht 65.0 in | Wt 212.3 lb

## 2023-10-12 DIAGNOSIS — I25110 Atherosclerotic heart disease of native coronary artery with unstable angina pectoris: Secondary | ICD-10-CM

## 2023-10-12 DIAGNOSIS — Z09 Encounter for follow-up examination after completed treatment for conditions other than malignant neoplasm: Secondary | ICD-10-CM | POA: Insufficient documentation

## 2023-10-12 DIAGNOSIS — E785 Hyperlipidemia, unspecified: Secondary | ICD-10-CM | POA: Insufficient documentation

## 2023-10-12 DIAGNOSIS — R82998 Other abnormal findings in urine: Secondary | ICD-10-CM

## 2023-10-12 DIAGNOSIS — I200 Unstable angina: Secondary | ICD-10-CM | POA: Insufficient documentation

## 2023-10-12 DIAGNOSIS — R8290 Unspecified abnormal findings in urine: Secondary | ICD-10-CM | POA: Insufficient documentation

## 2023-10-12 LAB — URINALYSIS, MACROSCOPIC
BILIRUBIN: NEGATIVE mg/dL
BLOOD: NEGATIVE mg/dL
COLOR: NORMAL
GLUCOSE: NEGATIVE mg/dL
KETONES: NEGATIVE mg/dL
LEUKOCYTES: NEGATIVE WBCs/uL
NITRITE: NEGATIVE
PH: 6 (ref 5.0–8.0)
PROTEIN: NEGATIVE mg/dL
SPECIFIC GRAVITY: 1.014 (ref 1.005–1.030)
UROBILINOGEN: NEGATIVE mg/dL

## 2023-10-12 LAB — URINALYSIS, MICROSCOPIC
RBCS: <1 /[HPF] (ref <6.0)
WBCS: 2 /[HPF] (ref <11.0)

## 2023-10-12 MED ORDER — ROSUVASTATIN 20 MG TABLET
20.0000 mg | ORAL_TABLET | Freq: Every evening | ORAL | 3 refills | Status: DC
Start: 2023-10-12 — End: 2023-11-29

## 2023-10-12 MED ORDER — ROSUVASTATIN 20 MG TABLET
20.0000 mg | ORAL_TABLET | Freq: Every evening | ORAL | 3 refills | Status: DC
Start: 2023-10-12 — End: 2023-10-12

## 2023-10-12 NOTE — Progress Notes (Signed)
F[facility], [facility]  [address]  [place] [address]  Operated by [facility]  Transition of Care Management Note    [name]  [id number]  1956/03/31  Date of Encounter: 10/12/2023     Transition of Care Contact Information  Discharge date: Discharge Date: 10/07/2023  Transition Facility Type--Hospital (Inpatient or Observation)  Facility [facility] Interactive Contact(s):  Completed Contact: 10/11/2023 11:26 AM  Contact Method(s)-- Patient/Caregiver Telephone  Clinical Staff Name/Role who Barbara Cummins, RN     Data Reviewed  Medication Reconciliation completed      Chief Complaint   Patient presents with    Hospital Follow Up    Hospital Discharge Transition        HPI  [name] is a 67 y.o. female presenting today for follow-up after being discharged.  Since admission patient has been doing better. She originally was having chest pain and elevated blood pressure. They just observed her in the hospital. She didn't have an MPS or TTE. She has an echo scheduled at [facility] on 1/6. They started her on Imdur 30 mg daily. She has been having some headaches with it. They did not change any other medications. She denies any more chest pain.     Medication Reconciliation was performed today.    Medical and Surgical History reviewed with patient today; any changes have been updated in the EMR.     REVIEW OF SYSTEMS: As discussed above in the HPI; otherwise all other systems negative.      Physical Exam  Constitutional:       Appearance: Normal appearance.   HENT:      Head: Normocephalic and atraumatic.      Right Ear: External ear normal.      Left Ear: External ear normal.      Nose: Nose normal.      Mouth/Throat:      Mouth: Mucous membranes are moist.      Pharynx: Oropharynx is clear.   Eyes:      Extraocular Movements: Extraocular movements intact.      Pupils: Pupils are equal, round, and reactive to light.    Cardiovascular:      Rate and Rhythm: Normal rate and regular rhythm.      Heart sounds: Normal heart sounds.   Pulmonary:      Effort: Pulmonary effort is normal.      Breath sounds: Normal breath sounds.   Abdominal:      General: Abdomen is flat.      Palpations: Abdomen is soft.   Musculoskeletal:         General: Normal range of motion.      Cervical back: Normal range of motion and neck supple.   Skin:     General: Skin is warm and dry.   Neurological:      General: No focal deficit present.      Mental Status: She is alert.   Psychiatric:         Mood and Affect: Mood normal.          LABS/IMAGING  I personally reviewed the labs and imaging obtained during admission and discussed with patient.  Lab Results   Component Value Date    WBC 11.8 (H) 05/20/2023    HGB 15.1 05/20/2023    SODIUM 140 07/13/2023    CREATININE 1.09 (H) 07/13/2023    POTASSIUM 4.3 07/13/2023     No results found for  this or any previous visit (from the past 720 hour(s)).      ASSESSMENT/PLAN  [name] 67 y.o. female seen today for initial visit after discharge.  Final discharge diagnosis: Unstable Angina   AMB DC Diagnosis Risk: severe exacerbation requiring hospital admission  - I have reviewed the patient's discharge summary.   - Medications were reconciled and appropriate medication education was provided.   - Appropriate education about the admission and care coordination was also provided today.  Other transition actions (Optional) -: Discharge documentation was reviewed    Underlying comorbid conditions increasing complexity of patient care include: Hyperlipidemia, Coronary Artery Disease, Myocardial Infarction.  Changes made to comorbid condition management:  - no changes made at this appt  - pt w/ TTE in January   - refilled Crestor 20 mg       (R82.90) Foul smelling urine  Plan:   - ordered: URINALYSIS, MACROSCOPIC AND MICROSCOPIC         W/CULTURE REFLEX            (Z09) Hospital discharge follow-up  (primary  encounter diagnosis)        Kathrin Ruddy, MD  10/12/2023, 09:47         I saw and examined the patient.  I reviewed the resident's note.  I agree with the findings and plan of care as documented in the resident's note.  Any exceptions/additions are edited/noted.    Lynetta Mare, MD

## 2023-10-14 ENCOUNTER — Ambulatory Visit (INDEPENDENT_AMBULATORY_CARE_PROVIDER_SITE_OTHER): Payer: Self-pay | Admitting: Student in an Organized Health Care Education/Training Program

## 2023-10-14 ENCOUNTER — Ambulatory Visit (HOSPITAL_BASED_OUTPATIENT_CLINIC_OR_DEPARTMENT_OTHER): Payer: Self-pay | Admitting: Physician Assistant

## 2023-10-14 NOTE — Telephone Encounter (Signed)
Regarding: Clinical Question  ----- Message from Verdon Cummins sent at 10/14/2023 12:09 PM EST -----  Copied From CRM [id number].[name], [name] called with a clinical question.   Was seen on 18Dec24 and states their physician "didn't do anything" about their "sinus infection" and is requesting to have "something called in..."  Please advise as need be.

## 2023-10-17 MED ORDER — AMOXICILLIN 875 MG-POTASSIUM CLAVULANATE 125 MG TABLET
1.0000 | ORAL_TABLET | Freq: Two times a day (BID) | ORAL | 0 refills | Status: AC
Start: 2023-10-17 — End: 2023-10-24

## 2023-10-17 NOTE — Telephone Encounter (Signed)
Augmentin sent to pharmacy if patient fails conservative measures with OTC antihistamines, nasal sprays, and other cold/ flu agents for >10 days.     Moxie Kalil Dalton, PA-C

## 2023-10-18 ENCOUNTER — Other Ambulatory Visit (HOSPITAL_BASED_OUTPATIENT_CLINIC_OR_DEPARTMENT_OTHER): Payer: Self-pay | Admitting: Physician Assistant

## 2023-10-31 ENCOUNTER — Ambulatory Visit (HOSPITAL_COMMUNITY): Payer: Self-pay

## 2023-11-17 ENCOUNTER — Other Ambulatory Visit: Payer: Self-pay

## 2023-11-17 ENCOUNTER — Ambulatory Visit
Admission: RE | Admit: 2023-11-17 | Discharge: 2023-11-17 | Disposition: A | Discharge status: Home / Self Care | Payer: Medicare Other | Source: Ambulatory Visit | Attending: Cardiovascular Disease | Admitting: Cardiovascular Disease

## 2023-11-17 DIAGNOSIS — I10 Essential (primary) hypertension: Secondary | ICD-10-CM | POA: Insufficient documentation

## 2023-11-17 DIAGNOSIS — I2510 Atherosclerotic heart disease of native coronary artery without angina pectoris: Secondary | ICD-10-CM | POA: Insufficient documentation

## 2023-11-17 LAB — TRANSTHORACIC ECHOCARDIOGRAM - ADULT
Aortic Valve Area by Continuity of Peak Velocity: 2.31 cm2
Aortic Valve Area by Continuity of VTI: 2.52 cm2
Ascending aorta: 2.93 cm
FS: 12 %{total} — AB (ref 28–44)
IVS: 0.76 cm (ref 0.6–1.1)
LV Diastolic Volume Index: 48.9 mL
LV Systolic Volume Index: 36.1 mL
LVIDD: 3.44 cm — AB (ref 3.5–6.0)
LVIDS: 3.04 cm (ref 2.1–4.0)
LVOT diameter: 2.04 cm
LVOT stroke volume: 71.07 mL
LVPWD: 0.93 cm
MV Deceleration Time: 278.85 ms
MV Peak A Vel: 72.57 cm/s
MV Peak E Vel: 53.14 cm/s
Pulmonic Valve Acceleration Time: 64.7 ms
RVDD: 2.3 cm

## 2023-11-17 MED ORDER — SODIUM CHLORIDE 0.9 % INJECTION SOLUTION
2.0000 mL | INTRAVENOUS | Status: AC
Start: 2023-11-17 — End: 2023-11-17
  Administered 2023-11-17: 2 mL via INTRAVENOUS

## 2023-11-23 ENCOUNTER — Other Ambulatory Visit (HOSPITAL_BASED_OUTPATIENT_CLINIC_OR_DEPARTMENT_OTHER): Payer: Self-pay | Admitting: Physician Assistant

## 2023-11-29 ENCOUNTER — Other Ambulatory Visit: Payer: Self-pay

## 2023-11-29 ENCOUNTER — Encounter (HOSPITAL_COMMUNITY): Payer: Self-pay | Admitting: CARDIOVASCULAR DISEASE

## 2023-11-29 ENCOUNTER — Ambulatory Visit: Payer: Medicare (Managed Care) | Attending: Physician Assistant | Admitting: CARDIOVASCULAR DISEASE

## 2023-11-29 VITALS — BP 107/69 | HR 76 | Temp 97.5°F | Ht 65.0 in | Wt 214.7 lb

## 2023-11-29 DIAGNOSIS — E785 Hyperlipidemia, unspecified: Secondary | ICD-10-CM | POA: Insufficient documentation

## 2023-11-29 DIAGNOSIS — I10 Essential (primary) hypertension: Secondary | ICD-10-CM

## 2023-11-29 DIAGNOSIS — I2510 Atherosclerotic heart disease of native coronary artery without angina pectoris: Secondary | ICD-10-CM | POA: Insufficient documentation

## 2023-11-29 DIAGNOSIS — Z955 Presence of coronary angioplasty implant and graft: Secondary | ICD-10-CM

## 2023-11-29 DIAGNOSIS — Z0000 Encounter for general adult medical examination without abnormal findings: Secondary | ICD-10-CM | POA: Insufficient documentation

## 2023-11-29 LAB — LIPID PANEL
CHOL/HDL RATIO: 3
CHOLESTEROL: 130 mg/dL (ref 100–200)
HDL CHOL: 44 mg/dL — ABNORMAL LOW (ref >=50)
LDL CALC: 62 mg/dL (ref <100)
NON-HDL: 86 mg/dL (ref <=190)
TRIGLYCERIDES: 135 mg/dL (ref <150)
VLDL CALC: 20 mg/dL (ref <30)

## 2023-11-29 MED ORDER — ROSUVASTATIN 40 MG TABLET
40.0000 mg | ORAL_TABLET | Freq: Every evening | ORAL | 3 refills | Status: DC
Start: 2023-11-29 — End: 2024-03-06

## 2023-12-02 ENCOUNTER — Other Ambulatory Visit: Payer: Self-pay

## 2023-12-02 ENCOUNTER — Ambulatory Visit: Payer: Medicare (Managed Care) | Attending: Physician Assistant | Admitting: Physician Assistant

## 2023-12-02 ENCOUNTER — Encounter (HOSPITAL_BASED_OUTPATIENT_CLINIC_OR_DEPARTMENT_OTHER): Payer: Self-pay | Admitting: Physician Assistant

## 2023-12-02 VITALS — BP 118/70 | HR 99 | Temp 97.5°F | Ht 65.0 in | Wt 215.4 lb

## 2023-12-02 DIAGNOSIS — I2510 Atherosclerotic heart disease of native coronary artery without angina pectoris: Secondary | ICD-10-CM | POA: Insufficient documentation

## 2023-12-02 DIAGNOSIS — I219 Acute myocardial infarction, unspecified: Secondary | ICD-10-CM | POA: Insufficient documentation

## 2023-12-02 DIAGNOSIS — Z1231 Encounter for screening mammogram for malignant neoplasm of breast: Secondary | ICD-10-CM | POA: Insufficient documentation

## 2023-12-02 DIAGNOSIS — G629 Polyneuropathy, unspecified: Secondary | ICD-10-CM | POA: Insufficient documentation

## 2023-12-02 DIAGNOSIS — J479 Bronchiectasis, uncomplicated: Secondary | ICD-10-CM | POA: Insufficient documentation

## 2023-12-02 DIAGNOSIS — Z6835 Body mass index (BMI) 35.0-35.9, adult: Secondary | ICD-10-CM | POA: Insufficient documentation

## 2023-12-02 DIAGNOSIS — Z0000 Encounter for general adult medical examination without abnormal findings: Secondary | ICD-10-CM | POA: Insufficient documentation

## 2023-12-02 DIAGNOSIS — F332 Major depressive disorder, recurrent severe without psychotic features: Secondary | ICD-10-CM | POA: Insufficient documentation

## 2023-12-02 DIAGNOSIS — N183 Chronic kidney disease, stage 3 unspecified: Secondary | ICD-10-CM | POA: Insufficient documentation

## 2023-12-02 DIAGNOSIS — Z131 Encounter for screening for diabetes mellitus: Secondary | ICD-10-CM | POA: Insufficient documentation

## 2023-12-02 DIAGNOSIS — Z87891 Personal history of nicotine dependence: Secondary | ICD-10-CM

## 2023-12-02 DIAGNOSIS — N393 Stress incontinence (female) (male): Secondary | ICD-10-CM | POA: Insufficient documentation

## 2023-12-02 DIAGNOSIS — N3281 Overactive bladder: Secondary | ICD-10-CM | POA: Insufficient documentation

## 2023-12-02 LAB — THYROID STIMULATING HORMONE WITH FREE T4 REFLEX: TSH: 1.623 u[IU]/mL (ref 0.350–4.940)

## 2023-12-02 MED ORDER — MIRABEGRON ER 25 MG TABLET,EXTENDED RELEASE 24 HR
25.0000 mg | ORAL_TABLET | Freq: Every day | ORAL | 3 refills | Status: DC
Start: 2023-12-02 — End: 2024-03-06

## 2023-12-02 NOTE — Progress Notes (Signed)
 Department of Family Medicine   Progress Note    [name]  MRN: [id number]  DOB: June 22, 1956  Date of Service: 12/02/2023    CHIEF COMPLAINT  Chief Complaint   Patient presents with    Medication Check    Cough       SUBJECTIVE  [name] is a 68 y.o. female who presents to clinic for medicare wellness and incontinence.     Past medical history is positive for obesity, severe MDD, neuropathy, MI, CAD, bronchiectasis, CKD, and urinary incontinence.      She follows with psychiatry for management of her depression.  She is currently not taking any pharmaceutical therapy.    Her neuropathy is poorly controlled.  She is taking gabapentin 800 mg b.i.d. which helps to manage symptoms some.    Establish with Cardiology following MI for management of CAD and monitoring.  Current regimen consist of metoprolol 25 mg, Imdur 30 mg, rosuvastatin 40 mg, and aspirin 81 mg.  Denies any recent chest pain.    She has had a chronic persistent cough that has been difficult to determine the etiology of.  She has seen GI for concern of it being from GERD, ENT, and Pulmonary.  Current plan is to follow up with ENT for ablation nerve that may be contributing to symptoms.  Her inhaler regimen currently consists of albuterol p.r.n. and Spiriva daily.    Kidney function has been relatively stable.  No need to follow with Nephrology thus far.    Complaining of incontinence. Has followed with urology and was given Vesicare and exercises to do. Does not feel Vesicare helps at all.     Review of Systems:  Positive ROS discussed in HPI, otherwise all other systems negative.      Medications:   albuterol sulfate (PROAIR HFA) 90 mcg/actuation Inhalation oral inhaler, Take 2 Puffs by inhalation Every 4 hours as needed  amLODIPine (NORVASC) 2.5 mg Oral Tablet, Take 1 Tablet (2.5 mg total) by mouth Once a day (Patient not taking: Reported on 10/12/2023)  aspirin 81 mg Oral Tablet, Chewable, Chew 1 Tablet (81 mg total) Every  morning  baclofen (LIORESAL) 5 mg Oral Tablet, Take 1 Tablet (5 mg total) by mouth Three times a day as needed  BRILINTA 90 mg Oral Tablet, Take 1 Tablet (90 mg total) by mouth Twice daily  clobetasoL (TEMOVATE) 0.05 % Ointment, Apply topically Twice daily  desvenlafaxine (PRISTIQ) 100 mg Oral Tablet Sustained Release 24 hr, Take 1 Tablet (100 mg total) by mouth Once a day  diclofenac sodium (VOLTAREN) 1 % Gel, Apply topically Four times a day - before meals and bedtime (Patient taking differently: Apply topically Four times a day as needed)  famotidine (PEPCID) 40 mg Oral Tablet, Take 1 Tablet (40 mg total) by mouth Twice daily  fluticasone propionate (FLONASE) 50 mcg/actuation Nasal Spray, Suspension, INSTILL 2 SPRAYS INTO EACH NOSTRIL ONCE A DAY (Patient not taking: Reported on 11/29/2023)  gabapentin (NEURONTIN) 800 mg Oral Tablet, Take 1 Tablet (800 mg total) by mouth Twice daily  ipratropium-albuterol 0.5 mg-3 mg(2.5 mg base)/3 mL Solution for Nebulization, Take 3 mL by nebulization Every 6 hours as needed for Wheezing for up to 30 days (Patient not taking: Reported on 11/29/2023)  isosorbide mononitrate (IMDUR) 30 mg Oral Tablet Sustained Release 24 hr, 1 Tablet (30 mg total)  metoprolol succinate (TOPROL-XL) 25 mg Oral Tablet Sustained Release 24 hr, Take 1 Tablet (25 mg total) by mouth Once a day (Patient taking  differently: Take 1 Tablet (25 mg total) by mouth Every evening)  montelukast (SINGULAIR) 10 mg Oral Tablet, Take 1 Tablet (10 mg total) by mouth Every evening  nitroGLYCERIN (NITROSTAT) 0.4 mg Sublingual Tablet, Sublingual, DISSOLVE 1 TABLET UNDER THE TONGUE AS NEEDED FOR CHEST PAIN EVERY 5 MINUTES UP TO 3 TIMES. IF NO RELIEF CALL 911.  ondansetron (ZOFRAN) 4 mg Oral Tablet, Take 1 Tablet (4 mg total) by mouth Every 8 hours as needed for Nausea/Vomiting (Patient not taking: Reported on 10/04/2023)  pantoprazole (PROTONIX) 40 mg Oral Tablet, Delayed Release (E.C.), Take 1 Tablet (40 mg total) by mouth  Twice daily  rosuvastatin (CRESTOR) 40 mg Oral Tablet, Take 1 Tablet (40 mg total) by mouth Every evening  solifenacin (VESICARE) 10 mg Oral Tablet, Take 1 Tablet (10 mg total) by mouth Once a day  tiotropium bromide (SPIRIVA RESPIMAT) 2.5 mcg/actuation Inhalation oral inhaler, Take 2 Inhalations (2 Puffs total) by inhalation Once a day (Patient taking differently: Take 2 Inhalations (2 Puffs total) by inhalation Every evening)  umeclidinium (INCRUSE ELLIPTA) 62.5 mcg/actuation Inhalation oral inhaler, Take 1 Inhalation by inhalation Once a day (Patient not taking: Reported on 11/29/2023)    No facility-administered medications prior to visit.      Allergies:   Allergies   Allergen Reactions    Cymbalta [Duloxetine]      Rash on feet and hands    Blue Dye      Told to avoid due to patch test result by [facility]    Flavoring Agent  Other Adverse Reaction (Add comment)     Patient unsure of reaction    Lipitor [Atorvastatin] Myalgia     Muscle pain    Lisinopril  Other Adverse Reaction (Add comment)     Cough      Nickel      Blisters  "Allergic to all kinds of metal"     Codeine Itching     RASH    Mobic [Meloxicam] Nausea/ Vomiting     Nervous and shakes     Naprosyn [Naproxen] Nausea/ Vomiting    Nsaids (Non-Steroidal Anti-Inflammatory Drug) Nausea/ Vomiting         OBJECTIVE  BP 118/70   Pulse 99   Temp 36.4 C (97.5 F) (Thermal Scan)   Ht 1.651 m (5\' 5" )   Wt 97.7 kg (215 lb 6.2 oz)   SpO2 96%   BMI 35.84 kg/m       General: no distress  HENT: normocephalic, atraumatic, EOMs intact, nares patent, trachea midline, neck supple  Lungs: clear to auscultation bilaterally  Cardiovascular: RRR, no murmur  Extremities: no cyanosis or edema  Skin: warm and dry, no rash  Neurologic: gait is normal, AOx3  Psychiatric: normal affect and behavior    ASSESSMENT/PLAN  (Z00.00) Medicare annual wellness visit, subsequent  (primary encounter diagnosis)  Plan:  See attached    THYROID STIMULATING HORMONE WITH FREE T4 REFLEX           (E66.01,  Z68.35) Severe obesity (BMI 35.0-35.9 with comorbidity) (CMS HCC)  Plan:   Chronic, uncontrolled   Advised on benefits of weight loss and importance of dietary changes as well as increasing exercise as tolerated    (F33.2) Severe episode of recurrent major depressive disorder, 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)  Plan:   Chronic, stable   Defer management to Psychiatry    (G62.9) Neuropathy (CMS HCC)  Plan:   Chronic, stable   Continue gabapentin 100 mg b.i.d.    (  I21.9) Myocardial infarction (CMS HCC)  (I25.10) Coronary artery disease involving native coronary artery of native heart without angina pectoris  Plan:   Chronic, stable   Continue metoprolol, Imdur, ASA, and Crestor  Follow up with Cardiology as instructed    (J47.9) Bronchiectasis without complication (CMS HCC)  Plan:   Chronic, stable     (N18.30) CKD (chronic kidney disease) stage 3, GFR 30-59 ml/min (CMS HCC)  Plan:   Chronic, stable   Will continue to monitor and optimize risk factor management  COMPREHENSIVE METABOLIC PANEL, NON-FASTING          (N39.3) Stress incontinence  (N32.81) OAB (overactive bladder)  Plan:   Chronic, uncontrolled   Discussed options including pelvic floor therapy vs alternative medication vs referral to Ob gyn for consideration of alternative intervention  Opting to try alternative pharmaceutical therapy at this time   Discontinue VESIcare and trial of Myrbetriq  Advised on potential side effects    (Z12.31) Encounter for screening mammogram for breast cancer  Plan: MAMMO BILATERAL SCREENING-ADDL VIEWS/BREAST US         AS REQ BY RAD          (Z13.1) Screening for diabetes mellitus  Plan: HGA1C (HEMOGLOBIN A1C WITH EST AVG GLUCOSE)            Orders Placed This Encounter    MAMMO BILATERAL SCREENING-ADDL VIEWS/BREAST US AS REQ BY RAD    COMPREHENSIVE METABOLIC PANEL, NON-FASTING    THYROID STIMULATING HORMONE WITH FREE T4 REFLEX    HGA1C (HEMOGLOBIN A1C WITH EST AVG GLUCOSE)    mirabegron (MYRBETRIQ) 25 mg  Oral Tablet Sustained Release 24 hr         Return in about 3 months (around 02/29/2024), or if symptoms worsen or fail to improve.      Ovidio Hanger, PA-C 12/02/2023, 10:33

## 2023-12-02 NOTE — Progress Notes (Signed)
[facility]  [address]  [place] [address]  Phone: [phone]  Fax: [phone]    PATIENT NAME: [name] NUMBER: [id number]  DATE OF SERVICE: 12/02/23  DATE OF BIRTH: Jun 26, 1956  Age: 68 y.o.    Requesting Physician: Ovidio Hanger, PA-C     NEWPatient Clinic Visit     Chief Concern     Chief Complaint   Patient presents with    Heart Disease       History of Presenting Illness/Subjective     68 year old female with coronary artery disease s/p recent PCI x3 w/ DES x2 to LAD & x1 to Lcx, dyslipidemia, GERD. Patient presents to clinic today to establish care.     Patient was recently admitted to [facility] general with complaints of chest pain. She had ruled out for ACS. She did undergo a cardiac catheterization on 04/18/2023 for which she received PCI with stent placement to the LAD and left circumflex. She was discharged home on aspirin and Brilinta. The patient tells me that she felt good for approximately 2 weeks after her discharge. Over the last few months she has developed a persistent cough. Patient medications were switched with stoppage of her ACE/ARB but she continues to have cough. She is on many inhalers but doesn't see pulmonary.    The patient tells me she was evaluated by her PCP who ordered a chest x-ray which was unremarkable at that time. She did also have a WBC elevated by her report. Patient currently doesn't have any significant symptoms from cardiac standpoint. She underwent recently TTE which was essentially normal.       Problem List                              Patient Active Problem List   Diagnosis    Migraine Headaches    GERD not well controlled    Patellofemoral pain syndrome    Multinodular goiter    Microscopic hematuria    Neck pain    Lichen sclerosus et atrophicus of the vulva    Carpal tunnel syndrome on right    Osteoarthritis, knee    Left knee pain    Chest pain at rest    MINOR CAD (coronary artery disease)    Hyperlipidemia  LDL goal < 100    Otitis media of both ears    Mass of parotid gland, right    Squamous cell carcinoma    Chest pain    Dysuria    Multiple pulmonary nodules determined by computed tomography of lung    History of rheumatic fever    Neuropathy (CMS HCC)    PTSD (post-traumatic stress disorder)    Pain due to neuropathy of facial nerve    Lung nodule < 6cm on CT    S/P thyroidectomy    GAD (generalized anxiety disorder)    Severe episode of recurrent major depressive disorder, 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)    Right hip pain    Hand pain    Cervical neck pain with evidence of disc disease    Lumbar pain with radiation down right leg    History of coronary angioplasty with insertion of stent    Myocardial infarction (CMS HCC)    Severe obesity (BMI 35.0-35.9 with comorbidity) (CMS HCC)    Shoulder pain    Bronchiectasis without complication (CMS HCC)    OAB (overactive bladder)  Stress incontinence       Past Medical History     Current Outpatient Medications   Medication Sig    albuterol sulfate (PROAIR HFA) 90 mcg/actuation Inhalation oral inhaler Take 2 Puffs by inhalation Every 4 hours as needed    aspirin 81 mg Oral Tablet, Chewable Chew 1 Tablet (81 mg total) Every morning    baclofen (LIORESAL) 5 mg Oral Tablet Take 1 Tablet (5 mg total) by mouth Three times a day as needed    BRILINTA 90 mg Oral Tablet Take 1 Tablet (90 mg total) by mouth Twice daily    clobetasoL (TEMOVATE) 0.05 % Ointment Apply topically Twice daily    desvenlafaxine (PRISTIQ) 100 mg Oral Tablet Sustained Release 24 hr Take 1 Tablet (100 mg total) by mouth Once a day    diclofenac sodium (VOLTAREN) 1 % Gel Apply topically Four times a day - before meals and bedtime (Patient taking differently: Apply topically Four times a day as needed)    famotidine (PEPCID) 40 mg Oral Tablet Take 1 Tablet (40 mg total) by mouth Twice daily    gabapentin (NEURONTIN) 800 mg Oral Tablet Take 1 Tablet (800 mg total) by mouth Twice daily    isosorbide  mononitrate (IMDUR) 30 mg Oral Tablet Sustained Release 24 hr 1 Tablet (30 mg total)    metoprolol succinate (TOPROL-XL) 25 mg Oral Tablet Sustained Release 24 hr Take 1 Tablet (25 mg total) by mouth Once a day (Patient taking differently: Take 1 Tablet (25 mg total) by mouth Every evening)    mirabegron (MYRBETRIQ) 25 mg Oral Tablet Sustained Release 24 hr Take 1 Tablet (25 mg total) by mouth Once a day    montelukast (SINGULAIR) 10 mg Oral Tablet Take 1 Tablet (10 mg total) by mouth Every evening    nitroGLYCERIN (NITROSTAT) 0.4 mg Sublingual Tablet, Sublingual DISSOLVE 1 TABLET UNDER THE TONGUE AS NEEDED FOR CHEST PAIN EVERY 5 MINUTES UP TO 3 TIMES. IF NO RELIEF CALL 911.    ondansetron (ZOFRAN) 4 mg Oral Tablet Take 1 Tablet (4 mg total) by mouth Every 8 hours as needed for Nausea/Vomiting (Patient not taking: Reported on 10/04/2023)    pantoprazole (PROTONIX) 40 mg Oral Tablet, Delayed Release (E.C.) Take 1 Tablet (40 mg total) by mouth Twice daily    rosuvastatin (CRESTOR) 40 mg Oral Tablet Take 1 Tablet (40 mg total) by mouth Every evening    solifenacin (VESICARE) 10 mg Oral Tablet Take 1 Tablet (10 mg total) by mouth Once a day    tiotropium bromide (SPIRIVA RESPIMAT) 2.5 mcg/actuation Inhalation oral inhaler Take 2 Inhalations (2 Puffs total) by inhalation Once a day (Patient taking differently: Take 2 Inhalations (2 Puffs total) by inhalation Every evening)     Allergies   Allergen Reactions    Cymbalta [Duloxetine]      Rash on feet and hands    Blue Dye      Told to avoid due to patch test result by Derm Causey    Flavoring Agent  Other Adverse Reaction (Add comment)     Patient unsure of reaction    Lipitor [Atorvastatin] Myalgia     Muscle pain    Lisinopril  Other Adverse Reaction (Add comment)     Cough      Nickel      Blisters  "Allergic to all kinds of metal"     Codeine Itching     RASH    Mobic [Meloxicam] Nausea/ Vomiting  Nervous and shakes     Naprosyn [Naproxen] Nausea/ Vomiting    Nsaids  (Non-Steroidal Anti-Inflammatory Drug) Nausea/ Vomiting     Past Medical History:   Diagnosis Date    Anxiety     Arthritis     Awareness under anesthesia     woke during colonoscopy    Cancer (CMS HCC)     internal skin cancer    Chest pain 02/19/2016    Chronic pain     lower back    Depression     Dyspnea on exertion     GERD (gastroesophageal reflux disease)     controlled w/med    H/O hearing loss     b/l, no aids    H/O urinary tract infection     Sept 2024- resolved    Headache     Hearing loss     Heart murmur 1979    benign    History of coronary angioplasty with insertion of stent 05/09/2023    Hyperlipidemia LDL goal < 100 08/29/2013    denies    Migraine     MINOR CAD (coronary artery disease) 11/10/2012    no treatment other than cholesterol medications; follows regularly with K.Dalton, PA    Multinodular goiter     Muscle weakness     neck     Myocardial infarction (CMS [facility]) 05/09/2023    04/18/23, sees [facility]    Neck problem     spinal stenosis    Obesity     Peripheral edema     Rash     eczema    Rheumatic fever     in 1974 no complications    S/P thyroidectomy     Shortness of breath     DOE    Sleep apnea     Squamous cell carcinoma 02/06/2015    right side of neck    Thyroid disorder     s/p part thyroidectomy    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    Tinnitus     Vaginal prolapse 2012    surgery improved significantly    Wears glasses          Past Surgical History:   Procedure Laterality Date    CATARACT EXTRACTION Right 12/07/2022    CHALAZION EXCISION Left 12/22/2021    and conj lesion excision from caruncle    COLONOSCOPY  01/06/2009    COLONOSCOPY performed by Jan Fireman, AHMED F at [facility] OR ENDO    GASTROSCOPY  03/05/2010    GASTROSCOPY performed by Boyd Kerbs, SWATI at [facility] OR ENDO    GASTROSCOPY WITH BIOPSY  03/05/2010    GASTROSCOPY WITH BIOPSY performed by Knox Saliva at [facility] OR ENDO    HX ADENOIDECTOMY      HX ANKLE FRACTURE TX  2007    left distal fibula, casted    HX APPENDECTOMY       HX CHOLECYSTECTOMY      HX COLONOSCOPY      HX CYSTOCELE REPAIR  09/24/2009    HX HAND SURGERY  2012    for Carpal tunnel : right side treated    HX HEART CATHETERIZATION      HX HYSTERECTOMY      HX OOPHORECTOMY      left ovary removed    HX PAROTIDECTOMY       "surgical clamp"     HX PARTIAL THYROIDECTOMY Right 04/05/2019    R hemithyroidectomy for nodule, final path follicular adenoma  HX TONSILLECTOMY      HX TOTAL VAGINAL HYSTERECTOMY  1979    HX WISDOM TEETH EXTRACTION      HX WRIST FRACTURE TX Left 2007    FOOSH injury    PARATHYROID GLAND SURGERY      PARS PLANA VITRECTOMY Right 11/02/2021    Pars plana vitrectomy, nternal limiting membrane (ILM) and epiretinal membrane (ERM) peel, Injection of 20%  sf6 gas, Subtenon's injection of Cefuroxime 50 mg and Dexamethasone 2 mg    PB REVISE ULNAR NERVE AT ELBOW Left 1979    PB UPPER GI ENDOSCOPY,BIOPSY  12/19/2007    patulous GE junction zone, erythema, nonerosive GERD    SEPTOPLASTY             Family History     Family Medical History:       Problem Relation (Age of Onset)    Bipolar Disorder Daughter, Paternal Uncle    Breast Cancer Paternal Aunt, Paternal Aunt, Paternal Aunt, Paternal Aunt, Paternal Aunt, Other    Cancer Paternal Aunt, Paternal Aunt, Paternal Aunt, Paternal Aunt, Paternal Aunt, Other (56)    Congestive Heart Failure Father (50)    Coronary Artery Disease Mother (67), Father    Diabetes Mother    Heart Attack Father, Sister, Sister    High Cholesterol Mother    Hypertension (High Blood Pressure) Mother, Sister, Sister, Brother    Kidney Disease Sister    Leukemia Paternal Uncle    Stroke Paternal Grandmother    Thyroid Disease Sister              Social History        Social History     Socioeconomic History    Marital status: Divorced    Number of children: 3   Occupational History    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     Employer: NOT EMPLOYED     Comment: occasionally   Tobacco Use    Smoking status: Former     Current packs/day: 0.00      Average packs/day: 2.0 packs/day for 40.0 years (80.0 ttl pk-yrs)     Types: Cigarettes     Start date: 01/22/1965     Quit date: 01/22/2005     Years since quitting: 18.8    Smokeless tobacco: Never   Vaping Use    Vaping status: Never Used   Substance and Sexual Activity    Alcohol use: No     Alcohol/week: 0.0 standard drinks of alcohol    Drug use: No    Sexual activity: Yes     Partners: Male   Other Topics Concern    Uses Cane No    Uses walker No    Uses wheelchair No    Right hand dominant Yes    Left hand dominant No    Ambidextrous No    Ability to Walk 1 Flight of Steps without SOB/CP Yes    Routine Exercise No    Ability to Walk 2 Flight of Steps without SOB/CP No     Comment: SOB    Ability To Do Own ADL's Yes    Uses Walker No    Other Activity Level Yes     Comment: housework     Uses Cane No    Shift Work No    Unusual Sleep-Wake Schedule No   Social History Narrative    Right handed.        Review Of Systems      All review of systems  negative except as otherwise noted in the HPI.    Physical Examination     BP 107/69 (Site: Left Arm)   Pulse 76   Temp 36.4 C (97.5 F)   Ht 1.651 m (5\' 5" )   Wt 97.4 kg (214 lb 11.7 oz)   SpO2 98%   BMI 35.73 kg/m         GENERAL: Patient is alert oriented to time, place and person. No acute distress  HEENT:  Head is normocephalic, atraumatic.  Extraocular motions are intact.  Sclerae are nonicteric.   NECK:  Supple, without JVD or carotid bruits.   LUNGS:  Breath sounds are clear to auscultation bilaterally.   CARDIOVASCULAR:  Reveals a regular rate and rhythm with normal S1 and S2. No murmur, rub or gallop present  ABDOMEN:  Soft and non-distended with no visceromegaly.  LOWER EXTREMITIES:  Appear well-perfused without edema.    Transthoracic Echocardiogram     Results for orders placed or performed during the hospital encounter of 02/19/16   TRANSTHORACIC ECHOCARDIOGRAM - ADULT    Narrative     Left ventricle: LV wall thickness is mildly increased.  Concentric   hypertrophy present. The systolic function is normal with an ejection   fraction range of 55% - 60%. LV diastolic function is normal. There is no   evidence of increased ventricular filling pressure by Doppler parameters.        *Note: Due to a large number of results and/or encounters for the requested time period, some results have not been displayed. A complete set of results can be found in Results Review.     Left ventricle: EF 55-60%  Right ventricle: The cavity size is dilated. Wall thickness is normal. Systolic function is reduced. Systolic pressure is within the normal range. The estimated peak pressure is 30 mm Hg assuming that the right atrial pressure was 8 mmlg.  Left atrium: The atrium is normal in size.  Atrial septum: The septum was structurally nornal.  Right atrium: The atrium is normal in size.  Mitral valve: The valve is structurally normal. No stenosis. There is trivial regurgitation. The mean diastolic gradient is 2 mm Hg. The valve area (LVOT continuity) is 2.3 cm?.  Tricuspid valve: The valve is structurally normal. No stenosis. There is trivial regurgitation. The peak diastolic gradient is 2 mm Hg.  Aortic valve: The valve is structurally normal. The valve is trileaffet. Cusp separation is normal. Velocity is within the normal range. There is no stenosis. There is no regurgitation. The mean systolic gradient is 4 mm Hg. The LVOT to aortic valve VTI ratio is 0.66. The valve area by the velocity-time integral method is 2.0 cm*.  Pulmonic valve: Not weil visualized. No stenosis. There is no significant regurgitation. The peak systolic gradient is 4 mm      Percutaneous coronary intervention with stent placement of left anterior descending using a 3.0  40 overlapped with x a 3.0 x 13 [place] stents.  5. Percutaneous coronary intervention with stent placement of left circumflex using a 3.0 x 18 [place] stent.  6. Intravascular ultrasound of left anterior  descending.  7. Intravascular ultrasound of left circumflex.    Labs     The ASCVD Risk score (Arnett DK, et al., 2019) failed to calculate for the following reasons:    The patient has a prior MI or stroke diagnosis    Lab Results   Component Value Date    TRIG 135 11/29/2023  HDLCHOL 44 (L) 11/29/2023    LDLCHOL 62 11/29/2023    CHOLESTEROL 130 11/29/2023        Lab Results   Component Value Date    SODIUM 140 07/13/2023    POTASSIUM 4.3 07/13/2023    BUN 9 07/13/2023    CREATININE 1.09 (H) 07/13/2023    GFR 56 (L) 07/13/2023    GLUCOSENF 101 03/05/2019    GLUCOSEFAST 104 08/29/2013         Assessment & Plan       68 yo F w/ Hx of CAD s/p PCI x3 to LAD & Lcx who presents to clinic for follow up.    Impression:    #CAD s/p PCI x3 w/ DES x2 to LAD & x1 to the Lcx in 03/2023 at [facility] general  #HTN  #HLD  #Elevated BMI at 35.44  #Cough      Plan:    - Agree w/ OMT w/ DAPT & BB. DAPT for 12 months at least f/b monotherapy  - TTE w/o any abnormalities. Patient doing well from cardiac standpoint  - Increase statin dose, recheck lipid panel  - RTC in 6 months        Orders Placed This Encounter    LIPID PANEL    rosuvastatin (CRESTOR) 40 mg Oral Tablet         Return in about 6 months (around 05/28/2024).        / Nicky Pugh, MD  Cardiology Fellow, PGY-V  Division of Cardiovascular Medicine  Department of Medicine  [facility]  Pager - [phone]        Late entry for 11/29/2023 . I saw and examined the patient.  I reviewed the resident's note.  I agree with the findings and plan of care as documented in the resident's note.  Any exceptions/additions are edited/noted.        Kandice Moos, MD  Cardiology  [facility]  [facility] Medicine.

## 2023-12-02 NOTE — Patient Instructions (Signed)
Medicare Preventive Services  Medicare coverage information Recommendation for YOU   Heart Disease and Diabetes   Lipid profile Every 5 years or more often if at risk for cardiovascular disease     Lab Results   Component Value Date    CHOLESTEROL 130 11/29/2023    HDLCHOL 44 (L) 11/29/2023    LDLCHOL 62 11/29/2023    TRIG 135 11/29/2023         Diabetes Screening    Yearly for those at risk for diabetes, 2 tests per year for those with prediabetes Last Glucose:      Diabetes Self Management Training or Medical Nutrition Therapy  For those with diabetes, up to 10 hrs initial training within a year, subsequent years up to 2 hrs of follow up training Optional for those with diabetes     Medical Nutrition Therapy  Three hours of one-on-one counseling in first year, two hours in subsequent years Optional for those with diabetes, kidney disease   Intensive Behavioral Therapy for Obesity  Face-to-face counseling, first month every week, month 2-6 every other week, month 7-12 every month if continued progress is documented Optional for those with Body Mass Index 30 or higher  Your Body mass index is 35.84 kg/m.   Tobacco Cessation (Quitting) Counseling   Covers up to 8 smoking and tobacco-use cessation counseling sessions in a 85-month period.    Optional for those that use tobacco   Cancer Screening Last Completion Date   Colorectal screening   For anyone age 103 to 67 or any age if high risk:  Screening Colonoscopy every 10 yrs if low risk,  more frequent if higher risk  OR  Cologuard Stool DNA test once every 3 years OR  Fecal Occult Blood Testing yearly OR  Flexible  Sigmoidoscopy  every 5 yr OR  CT Colonography every 5 yrs    --07/23/2019 Colonoscopy completed, repeat in 3 years.  See below for due date if applicable.   Screening Pap Test   Recommended every 3 years for all women age 22 to 2, or every five years if combined with HPV test (routine screening not needed after total hysterectomy).  Medicare covers every  2 years or yearly if high risk.  Screening Pelvic Exam   Medicare covers every 2 years, yearly if high risk or childbearing age with abnormal Pap in last 3 yrs.     See below for due date if applicable.   Screening Mammogram   Recommended every 2 years for women age 23 to 30, or more frequent if you have a higher risk. Selectively recommended for women between 40-49 based on shared decisions about risk. Covered by Medicare up to every year for women age 59 or older --10/06/2022; ordered today.  See below for due date if applicable.         Lung Cancer Screening  Annual low dose computed tomography (LDCT scan) is recommended for those age 22-80 who smoked 20 pack-years and are current smokers or quit smoking within past 15 years, after counseling by your doctor or nurse clinician about the possible benefits or harms.   --07/28/2023  See below for due date if applicable.   Vaccinations   Respiratory syncytial virus (RSV)  Age 39 years or older: Based on shared clinical decision-making with your provider.  Pneumococcal Vaccine  Recommended routinely age 47+ with one or two separate vaccines based on your risk. Recommended before age 47 if medical conditions with increased risk  Seasonal Influenza Vaccine  Once every flu season   Hepatitis B Vaccine  3 doses if risk (including anyone with diabetes or liver disease)  Shingles Vaccine  Two doses at age 44 or older  Diphtheria Tetanus Pertussis Vaccine  ONCE as adult, booster every 10 years     Immunization History   Administered Date(s) Administered    Covid-19 Vaccine,Pfizer-BioNTech,Purple Top,25yrs+ 01/11/2020, 02/01/2020, 10/29/2020    DIPTH,PERTUSSIS-ACEL,TETANUS >10 YRS OLD 06/04/2010    H1n1 Flu Vaccine Injection (Admin) 10/10/2008    High-Dose Influenza Vaccine, 65+ 10/07/2023    INFLUENZA VIRUS VACCINE (ADMIN) 09/11/2001, 07/24/2012, 10/11/2013    Influenza Vaccine, 6 month-adult 09/09/2010, 09/07/2011, 07/24/2012, 10/11/2013, 08/19/2014, 08/28/2015, 06/19/2016,  07/14/2017, 07/20/2018, 07/12/2019, 06/10/2020, 08/07/2021    Influenza Vaccine, 65+ 07/16/2022, 08/03/2023    PREVNAR 20 08/29/2023    Pneumovax 03/29/2013    Shingrix - Zoster Vaccine 04/04/2019, 07/05/2019    Tetanus,Diptheria,Pertussis(BOOSTRIX) 06/04/2010, 03/12/2020     Shingles vaccine and Diphtheria Tetanus Pertussis vaccines are available at pharmacies or local health department without a prescription.   Other Preventative Screening  Last Completion Date   Bone Densitometry   Screening: All females ages 22 and older every 10 years if initial screening normal. Postmenopausal women ages 46-64 need screening with one or more risk factor: previous fracture, parental hip fracture, current smoker, low body weight, excessive alcohol use, Rheumatoid Arthritis   For women with diagnosed Osteoporosis, follow up is recommended every 2 years or a frequency recommended by your provider.     --10/05/2022  See below for due date if applicable.     Glaucoma Screening   Yearly if in high risk group such as diabetes, family history, African American age 60+ or Hispanic American age 63+   See your eye care provider for screening.   Hepatitis C Screening   Recommended  for those born between ages 18-79 years.   --12/12/2017  See below for due date if applicable.     HIV Testing  Recommended routinely at least ONCE, covered every year for age 87 to 51 regardless of risk, and every year for age over 54 who ask for the test or higher risk. Yearly or up to 3 times in pregnancy         See below for due date if applicable.   Abdominal Aortic Aneurysm Screening Ultrasound   Once with a family history of abdominal aortic aneurysms OR a female between 78-75 and have smoked at least 100 cigarettes in your lifetime.         See below for due date if applicable.       Your Personalized Schedule for Preventive Tests     Health Maintenance: Pending and Last Completed         Date Due Completion Date    RSV Adult 60+ or Pregnancy (1 - Risk  60-74 years 1-dose series) Never done ---    Colonoscopy 07/22/2022 07/23/2019    Covid-19 Vaccine (4 - 2024-25 season) 06/26/2023 10/29/2020    Breast Cancer Screening 10/07/2023 10/06/2022    Medicare Annual Wellness Visit - Calendar Year Insurers 10/26/2023 05/09/2023    Osteoporosis screening 10/05/2024 10/05/2022    Adult Tdap-Td (3 - Td or Tdap) 03/12/2030 03/12/2020                  For Information on Advanced Directives for Health Care:  [place]:  [email or web address]  [place], [place], [place], [place] General Information: [email or web address]     Will see you next year for Annual Wellness Visit!  It was nice meeting you!  Renda Rolls RN

## 2023-12-04 ENCOUNTER — Ambulatory Visit (INDEPENDENT_AMBULATORY_CARE_PROVIDER_SITE_OTHER): Payer: Medicare (Managed Care)

## 2023-12-04 ENCOUNTER — Ambulatory Visit (INDEPENDENT_AMBULATORY_CARE_PROVIDER_SITE_OTHER): Payer: Medicare (Managed Care) | Admitting: ORTHOPAEDIC SURGERY

## 2023-12-04 ENCOUNTER — Encounter (INDEPENDENT_AMBULATORY_CARE_PROVIDER_SITE_OTHER): Payer: Self-pay

## 2023-12-04 VITALS — BP 115/69 | HR 106 | Temp 98.4°F | Resp 18 | Ht 65.0 in | Wt 217.2 lb

## 2023-12-04 DIAGNOSIS — S8991XA Unspecified injury of right lower leg, initial encounter: Secondary | ICD-10-CM

## 2023-12-04 DIAGNOSIS — M25511 Pain in right shoulder: Secondary | ICD-10-CM

## 2023-12-04 DIAGNOSIS — M25561 Pain in right knee: Secondary | ICD-10-CM

## 2023-12-04 DIAGNOSIS — T148XXA Other injury of unspecified body region, initial encounter: Secondary | ICD-10-CM

## 2023-12-04 DIAGNOSIS — W19XXXA Unspecified fall, initial encounter: Secondary | ICD-10-CM

## 2023-12-04 DIAGNOSIS — M25551 Pain in right hip: Secondary | ICD-10-CM

## 2023-12-04 DIAGNOSIS — S4991XA Unspecified injury of right shoulder and upper arm, initial encounter: Secondary | ICD-10-CM

## 2023-12-04 DIAGNOSIS — R0781 Pleurodynia: Secondary | ICD-10-CM

## 2023-12-04 DIAGNOSIS — Z0189 Encounter for other specified special examinations: Secondary | ICD-10-CM

## 2023-12-04 DIAGNOSIS — S298XXA Other specified injuries of thorax, initial encounter: Secondary | ICD-10-CM

## 2023-12-04 NOTE — Nursing Note (Signed)
right Shoulder Sling dispensed to patient. Patient properly fitted for equipment prescribed today.  [name], [place]  12/04/2023, 18:55

## 2023-12-04 NOTE — Progress Notes (Signed)
History of Present Illness: [name] is a 68 y.o. female who [facility] with chief complaint of    Chief Complaint              Genia Hotter yesterday at home - back pain    Back Pain     Shoulder Pain     Bruising           68 year old female presents to Urgent Care with right knee pain, right sided rib pain, right shoulder pain, right hip pain, and back pain that started 1 day ago. Pt reports that she was running to the bathroom in her house when she got her foot caught on a small wagon and landed on her right side. She states that her rib pain and back pain increases with deep breathing. She endorses that she is on blood thinners and therefore cannot take NSAIDs. She notes that her shoulder pain was radiating down her arm yesterday with swelling which has since improved. She also reports that her rib pain radiates into her chest. She says that she has not tried any OTC medications for her symptoms. Pt associates right knee swelling and ecchymosis. Pt denies numbness.     Review Flowsheet  More data exists         11/29/2023   FUNCTIONAL HEALTH SCREENING   Because we are aware of abuse and domestic violence today, we ask all patients: Are you being hurt, hit, or frightened by anyone at your home or in your life?  N   Do you have any basic needs within your home that are not being met? (such as Food, Shelter, [employer], Tranportation, paying for bills and/or medications) N     I reviewed and confirmed the patient's past medical history taken by the nurse or medical assistant with the addition of the following:    Past Medical History:    Past Medical History:   Diagnosis Date    Anxiety     Arthritis     Awareness under anesthesia     woke during colonoscopy    Cancer (CMS HCC)     internal skin cancer    Chest pain 02/19/2016    Chronic pain     lower back    Depression     Dyspnea on exertion     GERD (gastroesophageal reflux disease)     controlled w/med    H/O hearing  loss     b/l, no aids    H/O urinary tract infection     Sept 2024- resolved    Headache     Hearing loss     Heart murmur 1979    benign    History of coronary angioplasty with insertion of stent 05/09/2023    Hyperlipidemia LDL goal < 100 08/29/2013    denies    Migraine     MINOR CAD (coronary artery disease) 11/10/2012    no treatment other than cholesterol medications; follows regularly with K.Dalton, PA    Multinodular goiter     Muscle weakness     neck     Myocardial infarction (CMS [facility]) 05/09/2023    04/18/23, sees [place] cards    Neck problem     spinal stenosis    Obesity     Peripheral edema     Rash     eczema    Rheumatic fever     in 1974 no complications    S/P  thyroidectomy     Shortness of breath     DOE    Sleep apnea     Squamous cell carcinoma 02/06/2015    right side of neck    Thyroid disorder     s/p part thyroidectomy    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    Tinnitus     Vaginal prolapse 2012    surgery improved significantly    Wears glasses      Past Surgical History:    Past Surgical History:   Procedure Laterality Date    Cataract extraction Right 12/07/2022    Chalazion excision Left 12/22/2021    Colonoscopy  01/06/2009    Gastroscopy  03/05/2010    Gastroscopy with biopsy  03/05/2010    Hx adenoidectomy      Hx ankle fracture tx  2007    Hx appendectomy      Hx cholecystectomy      Hx colonoscopy      Hx cystocele repair  09/24/2009    Hx hand surgery  2012    Hx heart catheterization      Hx hysterectomy      Hx oophorectomy      Hx parotidectomy       Hx partial thyroidectomy Right 04/05/2019    Hx tonsillectomy      Hx total vaginal hysterectomy  1979    Hx wisdom teeth extraction      Hx wrist fracture tx Left 2007    Parathyroid gland surgery      Pars plana vitrectomy Right 11/02/2021    Pb revise ulnar nerve at elbow Left 1979    Pb upper gi endoscopy,biopsy  12/19/2007    Septoplasty       Allergies:  Allergies   Allergen Reactions    Cymbalta [Duloxetine]      Rash on  feet and hands    Blue Dye      Told to avoid due to patch test result by Derm Sour John    Flavoring Agent  Other Adverse Reaction (Add comment)     Patient unsure of reaction    Lipitor [Atorvastatin] Myalgia     Muscle pain    Lisinopril  Other Adverse Reaction (Add comment)     Cough      Nickel      Blisters  "Allergic to all kinds of metal"     Codeine Itching     RASH    Mobic [Meloxicam] Nausea/ Vomiting     Nervous and shakes     Naprosyn [Naproxen] Nausea/ Vomiting    Nsaids (Non-Steroidal Anti-Inflammatory Drug) Nausea/ Vomiting     Medications:    Current Outpatient Medications   Medication Sig    albuterol sulfate (PROAIR HFA) 90 mcg/actuation Inhalation oral inhaler Take 2 Puffs by inhalation Every 4 hours as needed    aspirin 81 mg Oral Tablet, Chewable Chew 1 Tablet (81 mg total) Every morning    baclofen (LIORESAL) 5 mg Oral Tablet Take 1 Tablet (5 mg total) by mouth Three times a day as needed    BRILINTA 90 mg Oral Tablet Take 1 Tablet (90 mg total) by mouth Twice daily    clobetasoL (TEMOVATE) 0.05 % Ointment Apply topically Twice daily    desvenlafaxine (PRISTIQ) 100 mg Oral Tablet Sustained Release 24 hr Take 1 Tablet (100 mg total) by mouth Once a day    diclofenac sodium (VOLTAREN) 1 % Gel Apply topically Four times a day - before meals  and bedtime    famotidine (PEPCID) 40 mg Oral Tablet Take 1 Tablet (40 mg total) by mouth Twice daily    gabapentin (NEURONTIN) 800 mg Oral Tablet Take 1 Tablet (800 mg total) by mouth Twice daily    isosorbide mononitrate (IMDUR) 30 mg Oral Tablet Sustained Release 24 hr 1 Tablet (30 mg total)    metoprolol succinate (TOPROL-XL) 25 mg Oral Tablet Sustained Release 24 hr Take 1 Tablet (25 mg total) by mouth Once a day    mirabegron (MYRBETRIQ) 25 mg Oral Tablet Sustained Release 24 hr Take 1 Tablet (25 mg total) by mouth Once a day    montelukast (SINGULAIR) 10 mg Oral Tablet Take 1 Tablet (10 mg total) by mouth Every evening    nitroGLYCERIN (NITROSTAT) 0.4 mg  Sublingual Tablet, Sublingual DISSOLVE 1 TABLET UNDER THE TONGUE AS NEEDED FOR CHEST PAIN EVERY 5 MINUTES UP TO 3 TIMES. IF NO RELIEF CALL 911.    ondansetron (ZOFRAN) 4 mg Oral Tablet Take 1 Tablet (4 mg total) by mouth Every 8 hours as needed for Nausea/Vomiting    pantoprazole (PROTONIX) 40 mg Oral Tablet, Delayed Release (E.C.) Take 1 Tablet (40 mg total) by mouth Twice daily    rosuvastatin (CRESTOR) 40 mg Oral Tablet Take 1 Tablet (40 mg total) by mouth Every evening    tiotropium bromide (SPIRIVA RESPIMAT) 2.5 mcg/actuation Inhalation oral inhaler Take 2 Inhalations (2 Puffs total) by inhalation Once a day     Social History:    Social History     Tobacco Use    Smoking status: Former     Current packs/day: 0.00     Average packs/day: 2.0 packs/day for 40.0 years (80.0 ttl pk-yrs)     Types: Cigarettes     Start date: 01/22/1965     Quit date: 01/22/2005     Years since quitting: 18.8    Smokeless tobacco: Never   Vaping Use    Vaping status: Never Used   Substance Use Topics    Alcohol use: No     Alcohol/week: 0.0 standard drinks of alcohol    Drug use: No     Family History:  Family Medical History:       Problem Relation (Age of Onset)    Bipolar Disorder Daughter, Paternal Uncle    Breast Cancer Paternal Aunt, Paternal Aunt, Paternal Aunt, Paternal Aunt, Paternal Aunt, Other    Cancer Paternal Aunt, Paternal Aunt, Paternal Aunt, Paternal Aunt, Paternal Aunt, Other (32)    Congestive Heart Failure Father (50)    Coronary Artery Disease Mother (41), Father    Diabetes Mother    Heart Attack Father, Sister, Sister    High Cholesterol Mother    Hypertension (High Blood Pressure) Mother, Sister, Sister, Brother    Kidney Disease Sister    Leukemia Paternal Uncle    Stroke Paternal Grandmother    Thyroid Disease Sister          Review of Systems:    Musculoskeletal: +right knee pain and swelling, +right sided rib pain, +right shoulder pain, +right hip pain, +back pain   Skin: +right knee ecchymosis  Neurologic:  no numbness    All other review of systems were negative    Physical Exam:  Vital signs:   Vitals:    12/04/23 1710   BP: 115/69   Pulse: (!) 106   Resp: 18   Temp: 36.9 C (98.4 F)   TempSrc: Tympanic   SpO2: 97%   Weight: 98.5 kg (  217 lb 2.5 oz)   Height: 1.651 m (5\' 5" )   BMI: 36.14     Body mass index is 36.14 kg/m. Facility age limit for growth %iles is 20 years.  No LMP recorded. Patient has had a hysterectomy.    General:  In mild distress secondary to pain  Pulmonary:  clear to auscultation bilaterally, no wheezes, no rales and no rhonchi  Cardiovascular:  mild tachycardia, normal capillary refill  Musculoskeletal: No cervical, thoracic, or lumbar TTP. Severe TTP to superficial light touch over the entire right shoulder, right anterior rib cage, right lateral hip, and diffusely throughout right knee. Gate is slow and antalgic.   Skin:  warm/dry and no rash, bruising noted to the right wrist and right medial knee  Psychiatric:  Appropriate affect and behavior and Normal speech  Neurologic:   Alert and oriented x 3    Data Reviewed:    Radiography:  Right Knee X-ray Reviewed by Dr. Silvana Newness on 12/04/2023 at 18:22. Mild degenerative changes with no acute bony abnormality.     Radiography:  Right Shoulder X-ray Reviewed by Dr. Silvana Newness on 12/04/2023 at 18:22. No acute bony abnormality.     Radiography:  Right Tibia-Fibula X-ray Reviewed by Dr. Silvana Newness on 12/04/2023 at 18:23. No acute bony abnormality.     Radiography:  Right Hip X-ray Reviewed by Dr. Silvana Newness on 12/04/2023 at 18:23. Mild degenerative changes with no acute bony abnormality.     Radiography:  Bilateral Ribs with PA/AP Chest X-ray Reviewed by Dr. Silvana Newness on 12/04/2023 at 18:24. Normal aeration. No pneumothorax. No significant displaced rib fractures.     Course: Condition at discharge: Good     Differential Diagnosis: Fracture vs Contusion vs Sprain     Assessment:   Assessment/Plan   1. Fall    2. Right shoulder pain    3. Right hip pain    4. Right knee pain    5.  Contusion      Plan:    Orders Placed This Encounter    XR RIBS BILATERAL W PA/AP CHEST    Right Shoulder x-ray    XR HIP RIGHT    Right Knee x-ray    Right Tibia-Fibula x-ray    FOLLOW-UP: [facility] - [facility] - [place], [place]    DME - ARM SLING     Bilateral Ribs with PA/AP Chest, Right Shoulder, Right Hip, Right Knee, and Right Tibia-Fibula X-ray was performed in clinic, see data reviewed, discussed results with pt.  Shoulder sling was provided for comfort. Advised to use sling for no longer than 1 week, but ideally less.   Strongly encouraged shoulder range of motion exercises, such as pendulum swings and wall walks given the risks of adhesive capsulitis with shoulder immobilization.   Incentive spirometer given to patient to reduce risk of atelectasis.   Order placed for family medicine follow up.   She is on baseline Baclofen 5 mg TID PRN. Educated patient that she may increase dosing up to 10 mg TID PRN for the next week and educated her on the side-affects associated with this.  Encouraged the use of Tylenol.     Go to Emergency Department immediately for further work up if any concerning symptoms.  Plan was discussed and patient verbalized understanding. If symptoms are worsening or not improving the patient should return to the [facility] for further evaluation.    I am scribing for, and in the presence of, Dr. Wyn Forster Edsel Shives for services provided  on 12/04/2023.  Valene Bors, SCRIBE   John Strzelec, SCRIBE 12/04/2023, 17:38    I personally performed the services described in this documentation, as scribed  in my presence, and it is both accurate  and complete.    Galen Daft, MD

## 2023-12-04 NOTE — Nursing Note (Signed)
Patient has given verbal permission for the scribe to assist the healthcare provider during the patient's visit.[employer], RN 12/04/2023, 17:10

## 2023-12-07 ENCOUNTER — Ambulatory Visit (INDEPENDENT_AMBULATORY_CARE_PROVIDER_SITE_OTHER): Payer: Self-pay | Admitting: Internal Medicine

## 2023-12-08 ENCOUNTER — Ambulatory Visit (HOSPITAL_BASED_OUTPATIENT_CLINIC_OR_DEPARTMENT_OTHER)
Admission: RE | Admit: 2023-12-08 | Discharge: 2023-12-08 | Disposition: A | Discharge status: Home / Self Care | Payer: Medicare (Managed Care) | Source: Ambulatory Visit

## 2023-12-08 ENCOUNTER — Other Ambulatory Visit: Payer: Self-pay

## 2023-12-08 ENCOUNTER — Ambulatory Visit
Payer: Medicare (Managed Care) | Attending: Student in an Organized Health Care Education/Training Program | Admitting: Student in an Organized Health Care Education/Training Program

## 2023-12-08 ENCOUNTER — Ambulatory Visit (HOSPITAL_BASED_OUTPATIENT_CLINIC_OR_DEPARTMENT_OTHER): Payer: Medicare (Managed Care)

## 2023-12-08 ENCOUNTER — Encounter (HOSPITAL_BASED_OUTPATIENT_CLINIC_OR_DEPARTMENT_OTHER): Payer: Self-pay | Admitting: Student in an Organized Health Care Education/Training Program

## 2023-12-08 DIAGNOSIS — T148XXA Other injury of unspecified body region, initial encounter: Secondary | ICD-10-CM | POA: Insufficient documentation

## 2023-12-08 DIAGNOSIS — M7989 Other specified soft tissue disorders: Secondary | ICD-10-CM

## 2023-12-08 DIAGNOSIS — M25539 Pain in unspecified wrist: Secondary | ICD-10-CM

## 2023-12-08 DIAGNOSIS — M25439 Effusion, unspecified wrist: Secondary | ICD-10-CM

## 2023-12-08 DIAGNOSIS — W19XXXA Unspecified fall, initial encounter: Secondary | ICD-10-CM

## 2023-12-08 DIAGNOSIS — M25551 Pain in right hip: Secondary | ICD-10-CM | POA: Insufficient documentation

## 2023-12-08 DIAGNOSIS — M25561 Pain in right knee: Secondary | ICD-10-CM | POA: Insufficient documentation

## 2023-12-08 DIAGNOSIS — M79605 Pain in left leg: Secondary | ICD-10-CM | POA: Insufficient documentation

## 2023-12-08 DIAGNOSIS — M25511 Pain in right shoulder: Secondary | ICD-10-CM | POA: Insufficient documentation

## 2023-12-08 NOTE — Progress Notes (Signed)
 [facility]   Department of Family Medicine   Acute Visit  Note    [name]  MRN: [id number]  DOB: 1956/10/15  Date of Service: 12/08/2023    CHIEF COMPLAINT  Chief Complaint   Patient presents with    Wrist Pain     Left side, reports swollen and painful    Leg Pain     Reports painful and swollen under knee       SUBJECTIVE  [name] is a 68 y.o. female who presents to clinic due to pain in her wrist and leg.    She reports that on Sunday after church she fell. She went to the UC, xrays WNL. She has been taking tylenol but des not feel it is helping at all.    Her left leg started hurting last night. The pain is at lateral  and anterior part of her left leg.   Her left wrist is swollen and very tender to touch since yesterday as well.        No fevers or chills, N/V, abdominal pain, change in taste or smell. No shortness of breath, chest pain or heart palpitations. No headache or dizziness.    Past Medical History:   Diagnosis Date    Anxiety     Arthritis     Awareness under anesthesia     woke during colonoscopy    Cancer (CMS HCC)     internal skin cancer    Chest pain 02/19/2016    Chronic pain     lower back    Depression     Dyspnea on exertion     GERD (gastroesophageal reflux disease)     controlled w/med    H/O hearing loss     b/l, no aids    H/O urinary tract infection     Sept 2024- resolved    Headache     Hearing loss     Heart murmur 1979    benign    History of coronary angioplasty with insertion of stent 05/09/2023    Hyperlipidemia LDL goal < 100 08/29/2013    denies    Migraine     MINOR CAD (coronary artery disease) 11/10/2012    no treatment other than cholesterol medications; follows regularly with K.Dalton, PA    Multinodular goiter     Muscle weakness     neck     Myocardial infarction (CMS [facility]) 05/09/2023    04/18/23, sees [place] cards    Neck problem     spinal stenosis    Obesity     Peripheral edema     Rash     eczema    Rheumatic fever     in 1974 no  complications    S/P thyroidectomy     Shortness of breath     DOE    Sleep apnea     Squamous cell carcinoma 02/06/2015    right side of neck    Thyroid disorder     s/p part thyroidectomy    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    Medications:   albuterol sulfate (PROAIR HFA) 90 mcg/actuation Inhalation oral inhaler, Take 2  Puffs by inhalation Every 4 hours as needed  aspirin 81 mg Oral Tablet, Chewable, Chew 1 Tablet (81 mg total) Every morning  baclofen (LIORESAL) 5 mg Oral Tablet, Take 1 Tablet (5 mg total) by mouth Three times a day as needed  BRILINTA 90 mg Oral Tablet, Take 1 Tablet (90 mg total) by mouth Twice daily  clobetasoL (TEMOVATE) 0.05 % Ointment, Apply topically Twice daily  desvenlafaxine (PRISTIQ) 100 mg Oral Tablet Sustained Release 24 hr, Take 1 Tablet (100 mg total) by mouth Once a day  diclofenac sodium (VOLTAREN) 1 % Gel, Apply topically Four times a day - before meals and bedtime  famotidine (PEPCID) 40 mg Oral Tablet, Take 1 Tablet (40 mg total) by mouth Twice daily  gabapentin (NEURONTIN) 800 mg Oral Tablet, Take 1 Tablet (800 mg total) by mouth Twice daily  metoprolol succinate (TOPROL-XL) 25 mg Oral Tablet Sustained Release 24 hr, Take 1 Tablet (25 mg total) by mouth Once a day  mirabegron (MYRBETRIQ) 25 mg Oral Tablet Sustained Release 24 hr, Take 1 Tablet (25 mg total) by mouth Once a day  montelukast (SINGULAIR) 10 mg Oral Tablet, Take 1 Tablet (10 mg total) by mouth Every evening  nitroGLYCERIN (NITROSTAT) 0.4 mg Sublingual Tablet, Sublingual, DISSOLVE 1 TABLET UNDER THE TONGUE AS NEEDED FOR CHEST PAIN EVERY 5 MINUTES UP TO 3 TIMES. IF NO RELIEF CALL 911.  ondansetron (ZOFRAN) 4 mg Oral Tablet, Take 1 Tablet (4 mg total) by mouth Every 8 hours as needed for  Nausea/Vomiting  pantoprazole (PROTONIX) 40 mg Oral Tablet, Delayed Release (E.C.), Take 1 Tablet (40 mg total) by mouth Twice daily  rosuvastatin (CRESTOR) 40 mg Oral Tablet, Take 1 Tablet (40 mg total) by mouth Every evening  tiotropium bromide (SPIRIVA RESPIMAT) 2.5 mcg/actuation Inhalation oral inhaler, Take 2 Inhalations (2 Puffs total) by inhalation Once a day  isosorbide mononitrate (IMDUR) 30 mg Oral Tablet Sustained Release 24 hr, 1 Tablet (30 mg total)    No facility-administered medications prior to visit.    Allergies:  Allergies   Allergen Reactions    Cymbalta [Duloxetine]      Rash on feet and hands    Blue Dye      Told to avoid due to patch test result by Derm Coram    Flavoring Agent  Other Adverse Reaction (Add comment)     Patient unsure of reaction    Lipitor [Atorvastatin] Myalgia     Muscle pain    Lisinopril  Other Adverse Reaction (Add comment)     Cough      Nickel      Blisters  "Allergic to all kinds of metal"     Codeine Itching     RASH    Mobic [Meloxicam] Nausea/ Vomiting     Nervous and shakes     Naprosyn [Naproxen] Nausea/ Vomiting    Nsaids (Non-Steroidal Anti-Inflammatory Drug) Nausea/ Vomiting         OBJECTIVE  BP 136/88   Pulse (!) 110   Temp 36.4 C (97.5 F) (Thermal Scan)   Ht 1.67 m (5' 5.75")   Wt 97.7 kg (215 lb 6.2 oz)   SpO2 96%   BMI 35.03 kg/m         General: Well appearing, in no acute distress.  HEENT: Head normocephalic, atraumatic. Sclera white, conjunctiva pink. PERRLA. TMs non-erythematous, non-bulging. Pharynx without erythema or exudates. Mucus membranes moist.  Neck: Supple, with trachea midline. Thyroid non-enlarged, non-tender.  Respiratory: Lungs clear to auscultation bilaterally.  No wheezing or crackles.  Cardiac: Regular rate and rhythm. Normal S1 and S2. No murmur.   Abdomen: Abdomen soft, nontender, nondistended. Normal bowel sounds. No hepatosplenomegaly.  Integument: No rashes or skin lesions.  HaematologicLymphatic: No bruising or  lymphadenopathy.  Extremities: Left wrist swelling, tenderness to palpation over snuffbox  Neurologic: Alert and oriented x3. CNs 2-12 grossly intact. Strength 5/5 in upper and lower extremity. Sensation intact bilaterally.  Psych: Normal affect and behavior.      ASSESSMENT/PLAN  [name] a 68 y.o. female presenting for APV      ICD-10-CM    1. Fall  W19.XXXA XR WRIST LEFT     Referral to ORTHOPAEDIC -  [place] locations      2. Swelling of wrist, unspecified laterality  M25.439 XR WRIST LEFT     Referral to ORTHOPAEDIC -  [place] locations      3. Pain in wrist, unspecified laterality  M25.539 XR WRIST LEFT     Referral to ORTHOPAEDIC -  [place] locations      4. Left leg pain  M79.605 XR TIBIA-FIBULA LEFT     XR KNEE LEFT 3 VIEW      5. Left leg swelling  M79.89 XR TIBIA-FIBULA LEFT     XR KNEE LEFT 3 VIEW      6. Right shoulder pain  M25.511       7. Right hip pain  M25.551       8. Right knee pain  M25.561       9. Contusion  T14.8XXA         - acute, uncontrolled  - discussed concern for wrist fracture given TTP over snuffbox, referral to ortho hand placed  - discussed scheduled tylenol and ice to help with swelling and inflammation  - discussed thumb spica brace to immobilize the left wrist until her follow up with ortho, patient will get a brace OTC    F/u PRN    Geoffry Paradise, MD, PhD

## 2023-12-09 DIAGNOSIS — M79605 Pain in left leg: Secondary | ICD-10-CM

## 2023-12-09 DIAGNOSIS — M7989 Other specified soft tissue disorders: Secondary | ICD-10-CM

## 2023-12-09 DIAGNOSIS — M1712 Unilateral primary osteoarthritis, left knee: Secondary | ICD-10-CM

## 2023-12-09 DIAGNOSIS — M25532 Pain in left wrist: Secondary | ICD-10-CM

## 2023-12-14 ENCOUNTER — Other Ambulatory Visit: Payer: Self-pay

## 2023-12-14 ENCOUNTER — Ambulatory Visit: Payer: Medicare (Managed Care)

## 2023-12-14 ENCOUNTER — Ambulatory Visit (HOSPITAL_BASED_OUTPATIENT_CLINIC_OR_DEPARTMENT_OTHER)
Admission: RE | Admit: 2023-12-14 | Discharge: 2023-12-14 | Disposition: A | Discharge status: Home / Self Care | Payer: Medicare (Managed Care) | Source: Ambulatory Visit | Admitting: Radiology

## 2023-12-14 VITALS — Temp 97.2°F | Ht 65.43 in | Wt 214.5 lb

## 2023-12-14 DIAGNOSIS — S63502A Unspecified sprain of left wrist, initial encounter: Secondary | ICD-10-CM

## 2023-12-14 DIAGNOSIS — W19XXXA Unspecified fall, initial encounter: Secondary | ICD-10-CM

## 2023-12-14 DIAGNOSIS — S63509A Unspecified sprain of unspecified wrist, initial encounter: Secondary | ICD-10-CM | POA: Insufficient documentation

## 2023-12-14 DIAGNOSIS — M25439 Effusion, unspecified wrist: Secondary | ICD-10-CM

## 2023-12-14 DIAGNOSIS — M19042 Primary osteoarthritis, left hand: Secondary | ICD-10-CM

## 2023-12-14 DIAGNOSIS — M25539 Pain in unspecified wrist: Secondary | ICD-10-CM | POA: Insufficient documentation

## 2023-12-14 DIAGNOSIS — M19032 Primary osteoarthritis, left wrist: Secondary | ICD-10-CM

## 2023-12-14 NOTE — H&P (Signed)
[place]  [address]  [place] [address]  Operated by [facility]  DEPARTMENT OF ORTHOPAEDICS    Clinic History and Physical    Patient: [name]  DOB: October 25, 1956  MRN: [id number]    Date: 12/14/2023    CHIEF COMPLAINT:  Chief Complaint   Patient presents with    Wrist Pain        HISTORY OF PRESENT ILLNESS:  [name] is a 68 yo female, new patient, with a chief complaint of left wrist injury.  Patient states she tripped and fell December 04, 2023.  She went to be seen at [facility] where they performed radiographs and placed a referral to ortho hand for snuffbox tenderness. Patient was not immobilized at that time. Today, patient states her wrist feels about the same. She believes her arthritis is flared up causing her pain. She has been icing her wrist. She is unable to take NSAIDs due to being on ASA. States tylenol does nothing for her. Denies numbness and tingling.     Hand dominance: left  Work: disabled  Tobacco use: denies  Alcohol use: denies  Patient ambulation status: no assistive device .   Antiplatelets/Anticoagulation includes: none.    PAST MEDICAL HISTORY:  Past Medical History:   Diagnosis Date    Anxiety     Arthritis     Awareness under anesthesia     woke during colonoscopy    Cancer (CMS HCC)     internal skin cancer    Chest pain 02/19/2016    Chronic pain     lower back    Depression     Dyspnea on exertion     GERD (gastroesophageal reflux disease)     controlled w/med    H/O hearing loss     b/l, no aids    H/O urinary tract infection     Sept 2024- resolved    Headache     Hearing loss     Heart murmur 1979    benign    History of coronary angioplasty with insertion of stent 05/09/2023    Hyperlipidemia LDL goal < 100 08/29/2013    denies    Migraine     MINOR CAD (coronary artery disease) 11/10/2012    no treatment other than cholesterol medications; follows regularly with K.Dalton, PA    Multinodular  goiter     Muscle weakness     neck     Myocardial infarction (CMS [facility]) 05/09/2023    04/18/23, sees [place] cards    Neck problem     spinal stenosis    Obesity     Peripheral edema     Rash     eczema    Rheumatic fever     in 1974 no complications    S/P thyroidectomy     Shortness of breath     DOE    Sleep apnea     Squamous cell carcinoma 02/06/2015    right side of neck    Thyroid disorder     s/p part thyroidectomy    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    Tinnitus     Vaginal prolapse 2012    surgery improved significantly    Wears glasses             PAST SURGICAL HISTORY:  Past Surgical History:   Procedure Laterality Date    CATARACT EXTRACTION Right 12/07/2022    CHALAZION EXCISION  Left 12/22/2021    and conj lesion excision from caruncle    COLONOSCOPY  01/06/2009    COLONOSCOPY performed by Jan Fireman, AHMED F at [facility] OR ENDO    GASTROSCOPY  03/05/2010    GASTROSCOPY performed by Knox Saliva at [facility] OR ENDO    GASTROSCOPY WITH BIOPSY  03/05/2010    GASTROSCOPY WITH BIOPSY performed by Knox Saliva at [facility] OR ENDO    HX ADENOIDECTOMY      HX ANKLE FRACTURE TX  2007    left distal fibula, casted    HX APPENDECTOMY      HX CHOLECYSTECTOMY      HX COLONOSCOPY      HX CYSTOCELE REPAIR  09/24/2009    HX HAND SURGERY  2012    for Carpal tunnel : right side treated    HX HEART CATHETERIZATION      HX HYSTERECTOMY      HX OOPHORECTOMY      left ovary removed    HX PAROTIDECTOMY       "surgical clamp"     HX PARTIAL THYROIDECTOMY Right 04/05/2019    R hemithyroidectomy for nodule, final path follicular adenoma    HX TONSILLECTOMY      HX TOTAL VAGINAL HYSTERECTOMY  1979    HX WISDOM TEETH EXTRACTION      HX WRIST FRACTURE TX Left 2007    FOOSH injury    PARATHYROID GLAND SURGERY      PARS PLANA VITRECTOMY Right 11/02/2021    Pars plana vitrectomy, nternal limiting membrane (ILM) and epiretinal membrane (ERM) peel, Injection of 20%  sf6 gas, Subtenon's injection of Cefuroxime 50 mg and Dexamethasone 2 mg    PB  REVISE ULNAR NERVE AT ELBOW Left 1979    PB UPPER GI ENDOSCOPY,BIOPSY  12/19/2007    patulous GE junction zone, erythema, nonerosive GERD    SEPTOPLASTY             FAMILY MEDICAL HISTORY:  Family Medical History:       Problem Relation (Age of Onset)    Bipolar Disorder Daughter, Paternal Uncle    Breast Cancer Paternal Aunt, Paternal Aunt, Paternal Aunt, Paternal Aunt, Paternal Aunt, Other    Cancer Paternal Aunt, Paternal Aunt, Paternal Aunt, Paternal Aunt, Paternal Aunt, Other (56)    Congestive Heart Failure Father (50)    Coronary Artery Disease Mother (30), Father    Diabetes Mother    Heart Attack Father, Sister, Sister    High Cholesterol Mother    Hypertension (High Blood Pressure) Mother, Sister, Sister, Brother    Kidney Disease Sister    Leukemia Paternal Uncle    Stroke Paternal Grandmother    Thyroid Disease Sister               MEDICATIONS:    Current Outpatient Medications:     albuterol sulfate (PROAIR HFA) 90 mcg/actuation Inhalation oral inhaler, Take 2 Puffs by inhalation Every 4 hours as needed, Disp: 18 g, Rfl: 0    aspirin 81 mg Oral Tablet, Chewable, Chew 1 Tablet (81 mg total) Every morning, Disp: , Rfl:     baclofen (LIORESAL) 5 mg Oral Tablet, Take 1 Tablet (5 mg total) by mouth Three times a day as needed, Disp: 90 Tablet, Rfl: 10    BRILINTA 90 mg Oral Tablet, Take 1 Tablet (90 mg total) by mouth Twice daily, Disp: , Rfl:     clobetasoL (TEMOVATE) 0.05 % Ointment, Apply topically Twice daily, Disp: 60 g, Rfl: 3  desvenlafaxine (PRISTIQ) 100 mg Oral Tablet Sustained Release 24 hr, Take 1 Tablet (100 mg total) by mouth Once a day, Disp: 90 Tablet, Rfl: 2    diclofenac sodium (VOLTAREN) 1 % Gel, Apply topically Four times a day - before meals and bedtime, Disp: 100 g, Rfl: 2    famotidine (PEPCID) 40 mg Oral Tablet, Take 1 Tablet (40 mg total) by mouth Twice daily, Disp: 180 Tablet, Rfl: 3    gabapentin (NEURONTIN) 800 mg Oral Tablet, Take 1 Tablet (800 mg total) by mouth Twice daily,  Disp: 60 Tablet, Rfl: 2    isosorbide mononitrate (IMDUR) 30 mg Oral Tablet Sustained Release 24 hr, 1 Tablet (30 mg total), Disp: , Rfl:     metoprolol succinate (TOPROL-XL) 25 mg Oral Tablet Sustained Release 24 hr, Take 1 Tablet (25 mg total) by mouth Once a day, Disp: 90 Tablet, Rfl: 4    mirabegron (MYRBETRIQ) 25 mg Oral Tablet Sustained Release 24 hr, Take 1 Tablet (25 mg total) by mouth Once a day, Disp: 30 Each, Rfl: 3    montelukast (SINGULAIR) 10 mg Oral Tablet, Take 1 Tablet (10 mg total) by mouth Every evening, Disp: 30 Tablet, Rfl: 3    nitroGLYCERIN (NITROSTAT) 0.4 mg Sublingual Tablet, Sublingual, DISSOLVE 1 TABLET UNDER THE TONGUE AS NEEDED FOR CHEST PAIN EVERY 5 MINUTES UP TO 3 TIMES. IF NO RELIEF CALL 911., Disp: 25 Tablet, Rfl: 10    ondansetron (ZOFRAN) 4 mg Oral Tablet, Take 1 Tablet (4 mg total) by mouth Every 8 hours as needed for Nausea/Vomiting, Disp: , Rfl:     pantoprazole (PROTONIX) 40 mg Oral Tablet, Delayed Release (E.C.), Take 1 Tablet (40 mg total) by mouth Twice daily, Disp: 180 Tablet, Rfl: 1    rosuvastatin (CRESTOR) 40 mg Oral Tablet, Take 1 Tablet (40 mg total) by mouth Every evening, Disp: 90 Tablet, Rfl: 3    tiotropium bromide (SPIRIVA RESPIMAT) 2.5 mcg/actuation Inhalation oral inhaler, Take 2 Inhalations (2 Puffs total) by inhalation Once a day, Disp: 4 g, Rfl: 5     ALLERGIES:  Allergies   Allergen Reactions    Cymbalta [Duloxetine]      Rash on feet and hands    Blue Dye      Told to avoid due to patch test result by [facility] [place]    Flavoring Agent  Other Adverse Reaction (Add comment)     Patient unsure of reaction    Lipitor [Atorvastatin] Myalgia     Muscle pain    Lisinopril  Other Adverse Reaction (Add comment)     Cough      Nickel      Blisters  "Allergic to all kinds of metal"     Codeine Itching     RASH    Mobic [Meloxicam] Nausea/ Vomiting     Nervous and shakes     Naprosyn [Naproxen] Nausea/ Vomiting    Nsaids (Non-Steroidal Anti-Inflammatory Drug) Nausea/  Vomiting        REVIEW OF SYSTEMS:  Other than ROS in the HPI, all other systems were negative    PHYSICAL EXAM:  VITAL SIGNS: Temp 36.2 C (97.2 F)   Ht 1.662 m (5' 5.43")   Wt 97.3 kg (214 lb 8.1 oz)   BMI 35.23 kg/m       GENERAL: The patient is well developed and well nourished, awake, alert and oriented and appropriate mood and affect. Normal gait and station.   EENT: EOMI, no icterus or rhinorrhea  CHEST:  Neck supple, Symmetric nonlabored breathing, no acute respiratory distress  CARDIAC: 2+ peripheral pulses  GASTROINTESTINAL: Non-distended abdomen    MUSCULOSKELETAL:  LEFT UPPER EXTREMITY  SKIN: The skin over the wrist shows no rash, lesion or erythema and is comparable to the contralateral side.   SWELLING: There is mild focal swelling to left wrist  TENDERNESS: There is positive tenderness to palpation over the snuffbox, 1st dorsal compartment, thumb cmc and STT joint; no scapholunate interval tenderness; no ulnar fovea tenderness  ROM: Active ROM of the wrist and digits intact. able to make a fist and extend fingers  LIGAMENTS: no ligamentous exam performed today  MUSCLE: intact FPL, EPL and interosseous muscles   SENSATION: Sensation intact to light touch in axillary, median, radial and ulnar nerve distributions.  VASCULAR: Palpable radial pulse bilaterally.     TESTS REVIEWED:  Radiographs of the left wrist that were performed December 08, 2023 reviewed and analyzed by me.  Per my interpretation, no acute osseous abnormalities.  STT Arthritis.     IMAGING:  Three-view radiographs of the left wrist were performed today in clinic and analyzed by  me. Per my interpretation, no acute osseous abnormalities.  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    ASSESSMENT:  Lorayne Getchell is a 68 yo female of left wrist sprain and STT arthritis.    PLAN:  Discussed diagnosis and treatment plan moving forward with the patient.  Reviewed x-rays with the patient.  Discussed with her I do not visualize any acute fractures  on x-rays today.  She does have some STT arthritis.  I recommended immobilization with a left thumb spica splint for the next several weeks to allow the inflammation to calm down from her sprain.  Discussed with her I would like to see her back in 2 weeks with repeat x-rays.  States she leaves for the beach next week, so I will see her once she is back from her trip.     The patient was given the opportunity to ask questions. All of their questions were answered to their satisfaction. The patient is in agreement with our current treatment plan.     The patient will follow-up in 4 weeks, repeat radiographs of left wrist with scaphoid view.    Patient was seen and evaluated in clinic today independently     Parthenia Ames, PA-C  Department of Orthopaedic Surgery   12/14/23 at 13:39.

## 2023-12-16 ENCOUNTER — Ambulatory Visit (HOSPITAL_BASED_OUTPATIENT_CLINIC_OR_DEPARTMENT_OTHER): Payer: Self-pay | Admitting: Student in an Organized Health Care Education/Training Program

## 2023-12-19 ENCOUNTER — Other Ambulatory Visit: Payer: Self-pay

## 2023-12-19 ENCOUNTER — Other Ambulatory Visit (HOSPITAL_BASED_OUTPATIENT_CLINIC_OR_DEPARTMENT_OTHER): Payer: Self-pay | Admitting: Physician Assistant

## 2023-12-19 ENCOUNTER — Encounter (INDEPENDENT_AMBULATORY_CARE_PROVIDER_SITE_OTHER): Payer: Self-pay | Admitting: OTOLARYNGOLOGY

## 2023-12-19 ENCOUNTER — Ambulatory Visit: Payer: Medicare (Managed Care) | Attending: OTOLARYNGOLOGY | Admitting: OTOLARYNGOLOGY

## 2023-12-19 VITALS — BP 152/92 | HR 80 | Temp 97.3°F | Ht 65.0 in | Wt 216.5 lb

## 2023-12-19 DIAGNOSIS — R053 Chronic cough: Secondary | ICD-10-CM | POA: Insufficient documentation

## 2023-12-19 DIAGNOSIS — J300 Vasomotor rhinitis: Secondary | ICD-10-CM | POA: Insufficient documentation

## 2023-12-19 LAB — PARACHUTE DME

## 2023-12-19 MED ORDER — IPRATROPIUM BROMIDE 42 MCG (0.06 %) NASAL SPRAY
2.0000 | Freq: Three times a day (TID) | NASAL | 5 refills | Status: DC
Start: 2023-12-19 — End: 2024-08-13

## 2023-12-19 MED ORDER — ISOSORBIDE MONONITRATE ER 30 MG TABLET,EXTENDED RELEASE 24 HR
30.0000 mg | ORAL_TABLET | Freq: Every morning | ORAL | 3 refills | Status: DC
Start: 2023-12-19 — End: 2024-03-13

## 2023-12-19 NOTE — Telephone Encounter (Signed)
 Regarding: Ovidio Hanger, PA, refill medication- going out-of-town  ----- Message from Selena Batten sent at 12/19/2023 10:39 AM EST -----  Copied From CRM [id number].Darl Pikes (Self) called to request a prescription refill- she is going out-of-town tomorrow morning:    Disp Refills Start End   isosorbide mononitrate (IMDUR) 30 mg Oral Tablet Sustained Release 24 hr - - 10/10/2023 -   Sig: 1 Tablet (30 mg total)   Class: Historical Med     Preferred Pharmacy     [id number] - [address] [place] - [address]    [address] [place] [address]    Phone: [phone] Fax: [phone]    Hours: Not open 24 hours      Thank you,  Rinaldo Ratel

## 2023-12-19 NOTE — Progress Notes (Signed)
 [facility]  DEPARTMENT OF OTOLARYNGOLOGY - HEAD AND NECK SURGERY  CLINIC H&P    Name: [name], 68 y.o. female  MRN: [id number]  Date of Birth: 1955-12-01  Date of Service: 12/19/2023    Chief Complaint:    Chief Complaint   Patient presents with    Cough       History of Present Illness: [name] is a 68 y.o. female here today for follow up on chronic cough resistant to medical therapy with Pepcid 40 mg and pantoprazole 40mg . Last visit 09/14/23 we performed a left laryngeal nerve block. Since that time she had no significant improvement in dry cough but still has the cough if she does not have a cough drop at all times. Cough is worse at night despite elevating head. She feels that she has post-nasal drip whenever she eats which can trigger the cough.    On 08/16/2023: Patient was here to follow up on throat and nose problems. Was seen 2 months ago for same problem chronic cough and throat and nose problem.  She is on Prilosec and was started on Pepcid before dinner. Had some improvement but continue to have problem. She also was started on Singulair. She also uses Flonase. Gives her intermittent nose bleed. She does have many comorbidities.     On 06/08/2023: She was here for cough and throat problem. In June had heart attack, for which she had multiple stents put via catheter with cardiac cath. Then she began having severe nonstop coughing. Her symptoms became worse shortly after that and she had to be admitted in the hospital. Workup with multiple chest x-rays, discontinuation of Lisinopril, and different over-the-counter medication without any help. The patient has long lasting history of GERD and she is on Pantoprazole 40 mg in the morning before food. She also has long lasting allergies and she takes [employer] adviser and uses Flonase. She has been also on inhaler. The cough is debilitating, not improving. She denies any productive cough, just dry. She was also on 2 different antibiotics  for this without improvement.         Past Medical History:  Past Medical History:   Diagnosis Date    Anxiety     Arthritis     Awareness under anesthesia     woke during colonoscopy    Cancer (CMS HCC)     internal skin cancer    Chest pain 02/19/2016    Chronic pain     lower back    Depression     Dyspnea on exertion     GERD (gastroesophageal reflux disease)     controlled w/med    H/O hearing loss     b/l, no aids    H/O urinary tract infection     Sept 2024- resolved    Headache     Hearing loss     Heart murmur 1979    benign    History of coronary angioplasty with insertion of stent 05/09/2023    Hyperlipidemia LDL goal < 100 08/29/2013    denies    Migraine     MINOR CAD (coronary artery disease) 11/10/2012    no treatment other than cholesterol medications; follows regularly with K.Dalton, PA    Multinodular goiter     Muscle weakness     neck     Myocardial infarction (CMS [facility]) 05/09/2023    04/18/23, sees [place] cards    Neck problem     spinal stenosis  Obesity     Peripheral edema     Rash     eczema    Rheumatic fever     in 1974 no complications    S/P thyroidectomy     Shortness of breath     DOE    Sleep apnea     Squamous cell carcinoma 02/06/2015    right side of neck    Thyroid disorder     s/p part thyroidectomy    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    Tinnitus     Vaginal prolapse 2012    surgery improved significantly    Wears glasses          Past Surgical History:  Past Surgical History:   Procedure Laterality Date    Cataract extraction Right 12/07/2022    Chalazion excision Left 12/22/2021    Colonoscopy  01/06/2009    Gastroscopy  03/05/2010    Gastroscopy with biopsy  03/05/2010    Hx adenoidectomy      Hx ankle fracture tx  2007    Hx appendectomy      Hx cholecystectomy      Hx colonoscopy      Hx cystocele repair  09/24/2009    Hx hand surgery  2012    Hx heart catheterization      Hx hysterectomy      Hx oophorectomy      Hx parotidectomy       Hx partial thyroidectomy  Right 04/05/2019    Hx tonsillectomy      Hx total vaginal hysterectomy  1979    Hx wisdom teeth extraction      Hx wrist fracture tx Left 2007    Parathyroid gland surgery      Pars plana vitrectomy Right 11/02/2021    Pb revise ulnar nerve at elbow Left 1979    Pb upper gi endoscopy,biopsy  12/19/2007    Septoplasty       Medications:  Outpatient Medications Marked as Taking for the 12/19/23 encounter (Office Visit) with Leeanne Deed, MD   Medication Sig    albuterol sulfate (PROAIR HFA) 90 mcg/actuation Inhalation oral inhaler Take 2 Puffs by inhalation Every 4 hours as needed    aspirin 81 mg Oral Tablet, Chewable Chew 1 Tablet (81 mg total) Every morning    baclofen (LIORESAL) 5 mg Oral Tablet Take 1 Tablet (5 mg total) by mouth Three times a day as needed    BRILINTA 90 mg Oral Tablet Take 1 Tablet (90 mg total) by mouth Twice daily    clobetasoL (TEMOVATE) 0.05 % Ointment Apply topically Twice daily    desvenlafaxine (PRISTIQ) 100 mg Oral Tablet Sustained Release 24 hr Take 1 Tablet (100 mg total) by mouth Once a day    diclofenac sodium (VOLTAREN) 1 % Gel Apply topically Four times a day - before meals and bedtime    famotidine (PEPCID) 40 mg Oral Tablet Take 1 Tablet (40 mg total) by mouth Twice daily    gabapentin (NEURONTIN) 800 mg Oral Tablet Take 1 Tablet (800 mg total) by mouth Twice daily    ipratropium bromide (ATROVENT) 42 mcg (0.06 %) Nasal Spray, Non-Aerosol Administer 2 Sprays into affected nostril(s) Three times a day for 90 days    isosorbide mononitrate (IMDUR) 30 mg Oral Tablet Sustained Release 24 hr 1 Tablet (30 mg total)    metoprolol succinate (TOPROL-XL) 25 mg Oral Tablet Sustained Release 24 hr Take 1 Tablet (25 mg total) by mouth Once  a day    mirabegron (MYRBETRIQ) 25 mg Oral Tablet Sustained Release 24 hr Take 1 Tablet (25 mg total) by mouth Once a day    montelukast (SINGULAIR) 10 mg Oral Tablet Take 1 Tablet (10 mg total) by mouth Every evening    nitroGLYCERIN (NITROSTAT) 0.4 mg  Sublingual Tablet, Sublingual DISSOLVE 1 TABLET UNDER THE TONGUE AS NEEDED FOR CHEST PAIN EVERY 5 MINUTES UP TO 3 TIMES. IF NO RELIEF CALL 911.    ondansetron (ZOFRAN) 4 mg Oral Tablet Take 1 Tablet (4 mg total) by mouth Every 8 hours as needed for Nausea/Vomiting    pantoprazole (PROTONIX) 40 mg Oral Tablet, Delayed Release (E.C.) Take 1 Tablet (40 mg total) by mouth Twice daily    rosuvastatin (CRESTOR) 40 mg Oral Tablet Take 1 Tablet (40 mg total) by mouth Every evening    tiotropium bromide (SPIRIVA RESPIMAT) 2.5 mcg/actuation Inhalation oral inhaler Take 2 Inhalations (2 Puffs total) by inhalation Once a day      Family History:  Family Medical History:       Problem Relation (Age of Onset)    Bipolar Disorder Daughter, Paternal Uncle    Breast Cancer Paternal Aunt, Paternal Aunt, Paternal Aunt, Paternal Aunt, Paternal Aunt, Other    Cancer Paternal Aunt, Paternal Aunt, Paternal Aunt, Paternal Aunt, Paternal Aunt, Other (73)    Congestive Heart Failure Father (50)    Coronary Artery Disease Mother (50), Father    Diabetes Mother    Heart Attack Father, Sister, Sister    High Cholesterol Mother    Hypertension (High Blood Pressure) Mother, Sister, Sister, Brother    Kidney Disease Sister    Leukemia Paternal Uncle    Stroke Paternal Grandmother    Thyroid Disease Sister            Social History:  Social History     Occupational History    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     Employer: NOT EMPLOYED     Comment: occasionally   Tobacco Use    Smoking status: Former     Current packs/day: 0.00     Average packs/day: 2.0 packs/day for 40.0 years (80.0 ttl pk-yrs)     Types: Cigarettes     Start date: 01/22/1965     Quit date: 01/22/2005     Years since quitting: 18.9    Smokeless tobacco: Never   Vaping Use    Vaping status: Never Used   Substance and Sexual Activity    Alcohol use: No     Alcohol/week: 0.0 standard drinks of alcohol    Drug use: No    Sexual activity: Yes     Partners: Male      Allergies:  Allergies   Allergen Reactions    Cymbalta [Duloxetine]      Rash on feet and hands    Blue Dye      Told to avoid due to patch test result by [facility] [place]    Flavoring Agent  Other Adverse Reaction (Add comment)     Patient unsure of reaction    Lipitor [Atorvastatin] Myalgia     Muscle pain    Lisinopril  Other Adverse Reaction (Add comment)     Cough      Nickel      Blisters  "Allergic to all kinds of metal"     Codeine Itching     RASH    Mobic [Meloxicam] Nausea/ Vomiting     Nervous and shakes     Naprosyn [  Naproxen] Nausea/ Vomiting    Nsaids (Non-Steroidal Anti-Inflammatory Drug) Nausea/ Vomiting       Review of Systems:      Const: Denies   Eyes: Denies   ENMT: Denies  See HPI.  CV: Denies   Resp: Denies  GI: Denies   Musculo: Denies   Skin: Denies   Neuro: Denies   Psych: Denies   Endocrine: Denies   Hema/Lymph: Denies   Reviewed and updated                                                          All other systems reviewed and found to be negative.    Physical Exam:    BP (Non-Invasive): (!) 152/92 Temperature: 36.3 C (97.3 F) Heart Rate: 80      Height: 165.1 cm (5\' 5" ) Weight: 98.2 kg (216 lb 7.9 oz) Body mass index is 36.03 kg/m.      Const: Appears healthy and well developed. No signs of apparent distress present. Communicates normally.  Head/face: Normal on inspection. Salivary glands normal in size and texture. Facial strength normal.  Eyes: EOMI in both eyes and normal gaze bilaterally.   ENMT:  Ears: External ears WNL.   Nose: External nose WNL. Internal nose: Nasal mucosal edema. There are no acute secretions. Septum with moderate sepal deviation, s-shaped. Inferior turbinates are hypertrophied. See endoscopy.  Nasopharynx: Gag  Oral Cavity: Oral aperture is normal. Lips: Appear normal and healthy. Edentulous. Occlusion is normal. Gums appear healthy. Tongue appears normal. Oral mucosa moist with no thrush or mucositis.  Oropharynx: Appears normal. Soft palate normal in  appearance. Tonsils appear normal. Posterior pharynx is normal.  Neck: Normal to inspection. Unremarkable on palpation. No masses appreciated. Trachea midline. Thyroid is normal in size and texture.  Resp: Normal respiratory activities. No wheezing.   Lymph: No visible or palpable anterior or posterior cervical lymphadenopathy.    Skin: Skin is warm and dry.  Neuro: Mood is normal.  Cranial nerves: Cranial nerves I-XII intact.  Psych: Mood/Affect: Mood is normal. Affect is normal.     Review of Information:    Pathology: No results found for this or any previous visit (from the past 720 hours).   Imaging:        Procedure:       Working Diagnosis:      ICD-10-CM    1. 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 female with chronic cough noting no significant improvement with maximal medical treatment and left laryngeal nerve block. Due to cough starting after recent MI, could be pericardial inflammation leading to cough.    Encouraged patient to discuss cough with cardiology to see if it was a side-effect from medication or if any other medications can be provided.  Due to unknown cause of her cough; and failed maximum medical therapy.  Patient is planning on GI visit 4/18 for further workup  Continue Pantoprazole 40 mg in the morning and Pepcid 40 mg before dinner.    Atrovent prescribed for vasomotor rhinitis  Follow up as needed.  Orders Placed This Encounter    CANCELED: 31575 - LARYNGOSCOPY, FLEXIBLE DIAGNOSTIC (AMB ONLY)    ipratropium bromide (ATROVENT) 42 mcg (0.06 %) Nasal Spray, Non-Aerosol       Sairisheel Roselee Culver, MD    Leeanne Deed, MD    I saw and examined the patient.  I reviewed the resident's note.  I agree with the findings and plan of care as documented in the resident's note.  Any exceptions/additions are edited/noted.    Leeanne Deed, MD  12/20/2023, 12:38      CC:    PCP Ovidio Hanger, PA-C  [address]  [facility] [address]   Referring Provider No referring provider defined for this encounter.

## 2023-12-20 NOTE — Progress Notes (Signed)
 FAMILY MEDICINE, [facility]  [address]  [place] [address]  Operated by [facility]  Medicare Annual Wellness Visit    Name: [name] MRN:  [id number]   Date: 12/02/2023 Age: 68 y.o.       SUBJECTIVE:   [name] is a 68 y.o. female for presenting for Medicare Wellness exam.   I have reviewed and reconciled the medication list with the patient today.        12/02/2023    10:20 AM 05/09/2023    10:34 AM 10/05/2022     2:24 PM   Comprehensive Health Assessment-Adult   Do you wish to complete this form? Yes Yes Yes   During the past 4 weeks, how would you rate your health in general? Fair Good Fair       chronic back pain   During the past 4 weeks, how much difficulty have you had doing your usual activities inside and outside your home because of medical or emotional problems? Much difficulty       for a few days much dificulty but most of the time only some difficulty A little bit of difficulty Much difficulty       frequent breaks because of back pain.   During the past 4 weeks, was someone available to help you if you needed and wanted help? No Yes, as much as I wanted Yes, as much as I wanted   In the past year, how many times have you gone to the emergency department or been admitted to a hospital for a health problem? 2-4 times 1 time 1 time   Are you generally satisfied with your sleep? No Yes No       trouble falling asleep and staying asleep due the back pain   Do you have enough money to buy things you need in everyday life, such as food, clothing, medicines, and housing? Yes, always Yes, always Sometimes       in the process of moving so utilities cost should go down; no issue getting medications.  Has been on SSI disability and has been contacting Medicare/Social Security to fight for your Medicare A   Can you get to places beyond walking distance without help?  (For example, can you drive your own car or travel alone on buses)? Yes Yes Yes    Do you fasten your seatbelt when you are in a car? Yes, usually Yes, usually Yes, usually   Do you exercise 20 minutes 3 or more days per week (such as walking, dancing, biking, mowing grass, swimming)? Yes, most of the time Yes, most of the time No, I usually don't exercise this much   How often do you eat food that is healthy (fruits, vegetables, lean meats) instead of unhealthy (sweets, fast food, junk food, fatty foods)? Almost always Almost always Almost always   Have your parents, brothers or sisters had any of the following problems before the age of 78? (check all that apply) Heart problems, or hardening of the arteries;Diabetes (sugar);High cholesterol;Cancer;Other family illness;Mental health problems such as depression, bipolar, severe anxiety, postpartum depression Cancer;Diabetes (sugar);Heart problems, or hardening of the arteries Heart problems, or hardening of the arteries;Diabetes (sugar);High cholesterol;Cancer;Other family illness;Mental health problems such as depression, bipolar, severe anxiety, postpartum depression   How often do you have trouble taking medicines the eay you are told to take them? I always take them as prescribed I always take them as prescribed I always  take them as prescribed   Do you need any help communicating with your doctors and nurses because of vision or hearing problems? No No No   During the past 12 months, have you experienced confusion or memory loss that is happening more often or is getting worse? Yes       very stressed this past week No Yes       confusion; difficulty recognizing certain places; just a couple day   Do you have one person you think of as your personal doctor (primary care provider or family doctor)? Yes Yes Yes   If you are seeing a Primary Care Provider (PCP) or family doctor. please list their name Ovidio Hanger, PA-C [place] Ovidio Hanger, [place]   Are you now also seeing any specialist physician(s) (such as eye doctor, foot doctor,  skin doctor)? Yes Yes Yes   If you are seeing a specialist for anything such as foot, eye, skin, etc.  please list their name(s) see list see list see list   How confident are you that you can control or manage most of your health problems? Very confident Very confident Very confident       I have reviewed and updated as appropriate the past medical, family and social history. 12/02/2023 as summarized below:  Past Medical History:   Diagnosis Date    Anxiety     Arthritis     Awareness under anesthesia     woke during colonoscopy    Cancer (CMS HCC)     internal skin cancer    Chest pain 02/19/2016    Chronic pain     lower back    Depression     Dyspnea on exertion     GERD (gastroesophageal reflux disease)     controlled w/med    H/O hearing loss     b/l, no aids    H/O urinary tract infection     Sept 2024- resolved    Headache     Hearing loss     Heart murmur 1979    benign    History of coronary angioplasty with insertion of stent 05/09/2023    Hyperlipidemia LDL goal < 100 08/29/2013    denies    Migraine     MINOR CAD (coronary artery disease) 11/10/2012    no treatment other than cholesterol medications; follows regularly with K.Dalton, PA    Multinodular goiter     Muscle weakness     neck     Myocardial infarction (CMS [facility]) 05/09/2023    04/18/23, sees [place] cards    Neck problem     spinal stenosis    Obesity     Peripheral edema     Rash     eczema    Rheumatic fever     in 1974 no complications    S/P thyroidectomy     Shortness of breath     DOE    Sleep apnea     Squamous cell carcinoma 02/06/2015    right side of neck    Thyroid disorder     s/p part thyroidectomy    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    Tinnitus     Vaginal prolapse 2012    surgery improved significantly    Wears glasses      Past Surgical History:   Procedure Laterality Date    Cataract extraction Right 12/07/2022    Chalazion excision Left 12/22/2021    Colonoscopy  01/06/2009  Gastroscopy  03/05/2010    Gastroscopy with  biopsy  03/05/2010    Hx adenoidectomy      Hx ankle fracture tx  2007    Hx appendectomy      Hx cholecystectomy      Hx colonoscopy      Hx cystocele repair  09/24/2009    Hx hand surgery  2012    Hx heart catheterization      Hx hysterectomy      Hx oophorectomy      Hx parotidectomy       Hx partial thyroidectomy Right 04/05/2019    Hx tonsillectomy      Hx total vaginal hysterectomy  1979    Hx wisdom teeth extraction      Hx wrist fracture tx Left 2007    Parathyroid gland surgery      Pars plana vitrectomy Right 11/02/2021    Pb revise ulnar nerve at elbow Left 1979    Pb upper gi endoscopy,biopsy  12/19/2007    Septoplasty       Current Outpatient Medications   Medication Sig    albuterol sulfate (PROAIR HFA) 90 mcg/actuation Inhalation oral inhaler Take 2 Puffs by inhalation Every 4 hours as needed    aspirin 81 mg Oral Tablet, Chewable Chew 1 Tablet (81 mg total) Every morning    baclofen (LIORESAL) 5 mg Oral Tablet Take 1 Tablet (5 mg total) by mouth Three times a day as needed    BRILINTA 90 mg Oral Tablet Take 1 Tablet (90 mg total) by mouth Twice daily    clobetasoL (TEMOVATE) 0.05 % Ointment Apply topically Twice daily    desvenlafaxine (PRISTIQ) 100 mg Oral Tablet Sustained Release 24 hr Take 1 Tablet (100 mg total) by mouth Once a day    diclofenac sodium (VOLTAREN) 1 % Gel Apply topically Four times a day - before meals and bedtime    famotidine (PEPCID) 40 mg Oral Tablet Take 1 Tablet (40 mg total) by mouth Twice daily    gabapentin (NEURONTIN) 800 mg Oral Tablet Take 1 Tablet (800 mg total) by mouth Twice daily    ipratropium bromide (ATROVENT) 42 mcg (0.06 %) Nasal Spray, Non-Aerosol Administer 2 Sprays into affected nostril(s) Three times a day for 90 days    isosorbide mononitrate (IMDUR) 30 mg Oral Tablet Sustained Release 24 hr Take 1 Tablet (30 mg total) by mouth Every morning    metoprolol succinate (TOPROL-XL) 25 mg Oral Tablet Sustained Release 24 hr Take 1 Tablet (25 mg total) by mouth  Once a day    mirabegron (MYRBETRIQ) 25 mg Oral Tablet Sustained Release 24 hr Take 1 Tablet (25 mg total) by mouth Once a day    montelukast (SINGULAIR) 10 mg Oral Tablet Take 1 Tablet (10 mg total) by mouth Every evening    nitroGLYCERIN (NITROSTAT) 0.4 mg Sublingual Tablet, Sublingual DISSOLVE 1 TABLET UNDER THE TONGUE AS NEEDED FOR CHEST PAIN EVERY 5 MINUTES UP TO 3 TIMES. IF NO RELIEF CALL 911.    ondansetron (ZOFRAN) 4 mg Oral Tablet Take 1 Tablet (4 mg total) by mouth Every 8 hours as needed for Nausea/Vomiting    pantoprazole (PROTONIX) 40 mg Oral Tablet, Delayed Release (E.C.) Take 1 Tablet (40 mg total) by mouth Twice daily    rosuvastatin (CRESTOR) 40 mg Oral Tablet Take 1 Tablet (40 mg total) by mouth Every evening    tiotropium bromide (SPIRIVA RESPIMAT) 2.5 mcg/actuation Inhalation oral inhaler Take 2 Inhalations (2 Puffs total) by  inhalation Once a day     Family Medical History:       Problem Relation (Age of Onset)    Bipolar Disorder Daughter, Paternal Uncle    Breast Cancer Paternal Aunt, Paternal Aunt, Paternal Aunt, Paternal Aunt, Paternal Aunt, Other    Cancer Paternal Aunt, Paternal Aunt, Paternal Aunt, Paternal Aunt, Paternal Aunt, Other (53)    Congestive Heart Failure Father (50)    Coronary Artery Disease Mother (46), Father    Diabetes Mother    Heart Attack Father, Sister, Sister    High Cholesterol Mother    Hypertension (High Blood Pressure) Mother, Sister, Sister, Brother    Kidney Disease Sister    Leukemia Paternal Uncle    Stroke Paternal Grandmother    Thyroid Disease Sister            Social History     Socioeconomic History    Marital status: Divorced    Number of children: 3   Occupational History    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      Social Determinants of Health     Health Literacy: Medium Risk (08/03/2023)    Health Literacy     SDOH Health Literacy: Sometimes         List of Current Health Care Providers   Care Team       PCP       Name Type Specialty Phone Number    Ovidio Hanger, [place] Physician Assistant PHYSICIAN ASSISTANT [phone]              Care Team       Name Type Specialty Phone Number    Paulo Fruit, MD Physician FAMILY PRACTICE [phone]    Lincoln Brigham, MD Physician OPHTHALMOLOGY [phone]    Cyril Loosen, MD Physician UROLOGY [phone]    Leodis Liverpool, MD Physician PSYCHIATRY [phone]    Shari Prows, MD Not available [facility] SURGERY [phone]                      Health Maintenance   Topic Date Due    RSV Adult 60+ or Pregnancy (1 - Risk 60-74 years 1-dose series) Never done    Colonoscopy  07/22/2022    Covid-19 Vaccine (4 - 2024-25 season) 06/26/2023    Breast Cancer Screening  10/07/2023    Osteoporosis screening  10/05/2024    Adult Tdap-Td (3 - Td or Tdap) 03/12/2030    Hepatitis C screening  Completed    Influenza Vaccine  Completed    Shingles Vaccine  Completed    Medicare Annual Wellness Visit - Calendar Year Insurers  Completed    Pneumococcal Vaccination, Age 41+  Completed  CT Lung Cancer Screening  Discontinued     Medicare Wellness Assessment   Medicare initial or wellness physical in the last year?: No  Advance Directives   Does patient have a living will or MPOA: No           Advance directive information given to the patient today?: Patient Declined      Activities of Daily Living   Do you need help with dressing, bathing, or walking?: No    Do you need help with shopping, housekeeping, medications, or finances?: No   Do you have rugs in hallways, broken steps, or poor lighting?: Yes (area rug and is taped down)   Do you have grab bars in your bathroom, non-slip strips in your tub, and hand rails on your stairs?: Yes (will be getting a railing)   Cognitive Function Screen (1=Yes, 0=No)   What is you age?: Correct   What is the time to the nearest hour?: Correct   What is the year?: Correct   What is the name of this clinic?: Correct   Can the patient recognize two persons (the doctor, the nurse, home help, etc.)?: Correct   What is the date of your birth? (day and month sufficient) : Correct   In what year did World War II end?: Incorrect   Who is the current president of the [place]?: Correct   Count from 20 down to 1?: Correct   What address did I give you earlier?: Correct   Total Score: 9   Interpretation of Total Score: Greater than 6 Normal   Fall Risk Screen   Do you feel unsteady when standing or walking?: Yes (sometimes feel like leaning left when standing up)  Do you worry about falling?: No  Have you fallen in the past year?: Yes  How many times have you fallen?: 2 or more times  Were you ever injured from falling?: No  Timed up and go test (in seconds): 12   Depression Screen     Little interest or pleasure in doing things.: Not at all  Feeling down, depressed, or hopeless: Several Days (few days ago felt a bit depressed)  PHQ 2 Total: 1  Trouble falling or staying asleep, or sleeping too much.: Nearly every day  Feeling tired or having little energy: Nearly every day  Poor appetite or overeating: Not at all  Feeling bad about yourself/ that you are a failure in the past 2 weeks?: Not at all  Trouble concentrating on things in the past 2 weeks?: Not at all  Moving/Speaking slowly or being fidgety or restless  in the past 2 weeks?: Not at all  Thoughts that you would be better off DEAD, or of hurting yourself in some way.: Not at  all  PHQ 9 Total: 7  Interpretation of Total Score: 5-9 Mild depression     Pain Score   Pain Score:   8 (neck, back when standing)    Substance Use-Abuse Screening     Tobacco Use     In Past 12 MONTHS, how often have you used any tobacco product (for example, cigarettes, e-cigarettes, cigars, pipes, or smokeless tobacco)?: Never     Alcohol use     In the PAST 12 MONTHS, how often have you had 5 (men)/4 (women) or more drinks containing alcohol in one day?: Never     Prescription Drug Use     In the PAST 12 months, how often have you used any prescription medications  just for the feeling, more than prescribed, or that were not prescribed for you? Prescriptions may include: opioids, benzodiazepines, medications for ADHD: Never           Illicit Drug Use   In the PAST 12 MONTHS, how often have you used any drugs, including marijuana, cocaine or crack, heroin, methamphetamine, hallucinogens, ecstasy/MDMA?: Never                                OBJECTIVE:   BP 118/70   Pulse 99   Temp 36.4 C (97.5 F) (Thermal Scan)   Ht 1.651 m (5\' 5" )   Wt 97.7 kg (215 lb 6.2 oz)   SpO2 96%   BMI 35.84 kg/m        Other appropriate exam:    Health Maintenance Due   Topic Date Due    RSV Adult 60+ or Pregnancy (1 - Risk 60-74 years 1-dose series) Never done    Colonoscopy  07/22/2022    Covid-19 Vaccine (4 - 2024-25 season) 06/26/2023    Breast Cancer Screening  10/07/2023      ASSESSMENT & PLAN:   Assessment/Plan   1. Medicare annual wellness visit, subsequent    2. Severe obesity (BMI 35.0-35.9 with comorbidity) (CMS HCC)    3. Severe episode of recurrent major depressive disorder, 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)    4. Neuropathy (CMS HCC)    5. Myocardial infarction (CMS HCC)    6. Coronary artery disease involving native coronary artery of native heart without angina pectoris    7. Bronchiectasis without complication (CMS HCC)    8. CKD (chronic kidney disease) stage 3, GFR 30-59 ml/min (CMS HCC)    9. Stress  incontinence    10. OAB (overactive bladder)    11. Encounter for screening mammogram for breast cancer    12. Screening for diabetes mellitus       Identified Risk Factors/ Recommended Actions     Fall Risk Follow up plan of care: Discussed optimizing home safety        Patient declined Advanced Directives information.        Orders Placed This Encounter    MAMMO BILATERAL SCREENING-ADDL VIEWS/BREAST US AS REQ BY RAD    COMPREHENSIVE METABOLIC PANEL, NON-FASTING    THYROID STIMULATING HORMONE WITH FREE T4 REFLEX    HGA1C (HEMOGLOBIN A1C WITH EST AVG GLUCOSE)    mirabegron (MYRBETRIQ) 25 mg Oral Tablet Sustained Release 24 hr          The patient has been educated about risk factors and recommended preventive care. Written Prevention Plan completed/ updated and given to patient (see After Visit Summary).    Return in about 3 months (around 02/29/2024), or if symptoms worsen or fail to improve.    Alnita Aybar Dalton, PA-C

## 2023-12-22 ENCOUNTER — Telehealth (HOSPITAL_BASED_OUTPATIENT_CLINIC_OR_DEPARTMENT_OTHER): Payer: Self-pay | Admitting: Physician Assistant

## 2023-12-23 ENCOUNTER — Encounter (HOSPITAL_BASED_OUTPATIENT_CLINIC_OR_DEPARTMENT_OTHER): Payer: Self-pay | Admitting: Student in an Organized Health Care Education/Training Program

## 2023-12-28 ENCOUNTER — Ambulatory Visit (INDEPENDENT_AMBULATORY_CARE_PROVIDER_SITE_OTHER): Payer: Self-pay | Admitting: Internal Medicine

## 2024-01-12 ENCOUNTER — Ambulatory Visit (HOSPITAL_BASED_OUTPATIENT_CLINIC_OR_DEPARTMENT_OTHER): Payer: Self-pay

## 2024-01-20 ENCOUNTER — Other Ambulatory Visit (HOSPITAL_BASED_OUTPATIENT_CLINIC_OR_DEPARTMENT_OTHER): Payer: Self-pay | Admitting: Physician Assistant

## 2024-01-20 DIAGNOSIS — M62838 Other muscle spasm: Secondary | ICD-10-CM

## 2024-01-26 ENCOUNTER — Ambulatory Visit (HOSPITAL_BASED_OUTPATIENT_CLINIC_OR_DEPARTMENT_OTHER): Payer: Medicare (Managed Care)

## 2024-01-26 ENCOUNTER — Other Ambulatory Visit: Payer: Self-pay

## 2024-01-26 ENCOUNTER — Ambulatory Visit (HOSPITAL_BASED_OUTPATIENT_CLINIC_OR_DEPARTMENT_OTHER)
Admission: RE | Admit: 2024-01-26 | Discharge: 2024-01-26 | Disposition: A | Discharge status: Home / Self Care | Payer: Medicare (Managed Care) | Source: Ambulatory Visit | Admitting: Radiology

## 2024-01-26 ENCOUNTER — Ambulatory Visit: Payer: Medicare (Managed Care)

## 2024-01-26 VITALS — Temp 97.3°F | Ht 65.59 in | Wt 215.2 lb

## 2024-01-26 DIAGNOSIS — N183 Chronic kidney disease, stage 3 unspecified: Secondary | ICD-10-CM | POA: Insufficient documentation

## 2024-01-26 DIAGNOSIS — Z131 Encounter for screening for diabetes mellitus: Secondary | ICD-10-CM | POA: Insufficient documentation

## 2024-01-26 DIAGNOSIS — M25439 Effusion, unspecified wrist: Secondary | ICD-10-CM | POA: Insufficient documentation

## 2024-01-26 DIAGNOSIS — M19032 Primary osteoarthritis, left wrist: Secondary | ICD-10-CM

## 2024-01-26 LAB — COMPREHENSIVE METABOLIC PANEL, NON-FASTING
ALBUMIN: 3.9 g/dL (ref 3.4–4.8)
ALKALINE PHOSPHATASE: 60 U/L (ref 55–145)
ALT (SGPT): 17 U/L (ref <31)
ANION GAP: 9 mmol/L (ref 4–13)
AST (SGOT): 20 U/L (ref 11–34)
BILIRUBIN TOTAL: 0.4 mg/dL (ref 0.3–1.3)
BUN/CREA RATIO: 14 (ref 6–22)
BUN: 13 mg/dL (ref 8–25)
CALCIUM: 9.1 mg/dL (ref 8.6–10.3)
CHLORIDE: 110 mmol/L (ref 96–111)
CO2 TOTAL: 19 mmol/L — ABNORMAL LOW (ref 23–31)
CREATININE: 0.94 mg/dL (ref 0.60–1.05)
ESTIMATED GFR - FEMALE: 67 mL/min/BSA (ref >=60)
GLUCOSE: 108 mg/dL (ref 65–125)
POTASSIUM: 4.1 mmol/L (ref 3.7–5.3)
PROTEIN TOTAL: 6.7 g/dL (ref 5.6–7.6)
SODIUM: 138 mmol/L (ref 136–145)

## 2024-01-26 NOTE — Progress Notes (Signed)
 [place]  [address]  [place] [address]  Operated by [facility]  DEPARTMENT OF ORTHOPAEDICS     Clinic Follow-Up Note    Patient: [name]  DOB: August 30, 1956  MRN: [id number]    Date: 01/26/2024    DIAGNOSIS:  Assessment/Plan   1. Swelling of wrist, unspecified laterality         DATE OF INJURY:  12/04/2023    HISTORY OF PRESENT ILLNESS:  Raynah Gomes is a 68 year old female, established patient, seen today for follow up of left wrist pain.  Patient has been wearing a thumb spica splint for the past 5 weeks.  States she did go by a shorter splint as the previous splint was too tight on her proximal forearm.  States when she airs out her arm she will notice the arm swells.  She has not been taking anything for pain.  Denies numbness and tingling. Denies swelling in lower extremity.     Allergies   Allergen Reactions    Cymbalta  [Duloxetine ]      Rash on feet and hands    Blue Dye      Told to avoid due to patch test result by Derm Hamilton    Flavoring Agent  Other Adverse Reaction (Add comment)     Patient unsure of reaction    Lipitor [Atorvastatin ] Myalgia     Muscle pain    Lisinopril  Other Adverse Reaction (Add comment)     Cough      Nickel      Blisters  "Allergic to all kinds of metal"     Codeine  Itching     RASH    Mobic  [Meloxicam ] Nausea/ Vomiting     Nervous and shakes     Naprosyn  [Naproxen ] Nausea/ Vomiting    Nsaids (Non-Steroidal Anti-Inflammatory Drug) Nausea/ Vomiting            PHYSICAL EXAM:  VITAL SIGNS: Temp 36.3 C (97.3 F)   Ht 1.666 m (5' 5.59")   Wt 97.6 kg (215 lb 2.7 oz)   BMI 35.16 kg/m   GENERAL: The patient is well developed and well nourished, awake, alert and oriented and appropriate mood and affect. Normal gait and station.     LEFT UPPER EXTREMITY  SKIN: The skin over the wrist shows no rash, lesion or erythema and is comparable to the contralateral side.   SWELLING: There is positive focal swelling to  STT joint  TENDERNESS: There is positive tenderness to palpation over the STT joint, anatomical snuffbox tenderness.  ROM: Active ROM of the hand intact.  Able to make a fist and extend fingers.  Able to flex and extend wrist  LIGAMENTS:  No ligamentous exam performed today  MUSCLE:  Intact FPL, EPL, and interosseous muscles  SENSATION: Sensation intact to light touch in axillary, median, radial and ulnar nerve distributions.  VASCULAR: Palpable radial pulse     TESTS REVIEWED:  none    IMAGING:  Three-view radiographs of the left wrist were performed today in clinic and analyzed by me. Per my interpretation, STT arthritis, no scaphoid fracture visualized.     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     PLAN:  Discussed treatment plan moving forward with the patient.  Reviewed radiographs with the patient.  Discussed with her I believe the STT joint arthritis is the culprit of her pain.  Discussed nonoperative  management which included continue to wear the brace versus steroid injection.  Patient elected to continue wearing the brace.  She will follow up in 4 weeks and if her pain is not improved by then, she may want a steroid injection. Recommended following up with PCP to make sure her heart is okay given the swelling in left upper extremity.     The patient was given the opportunity to ask questions. All of their questions were answered to their satisfaction. The patient is in agreement with our current treatment plan.     The patient will follow-up in 4 weeks    Patient was seen and evaluated in clinic today independently.     Antonio Baumgarten, PA-C  Department of Orthopaedic Surgery   01/26/24 at 15:20.

## 2024-01-27 LAB — HGA1C (HEMOGLOBIN A1C WITH EST AVG GLUCOSE)
ESTIMATED AVERAGE GLUCOSE: 114 mg/dL
HEMOGLOBIN A1C: 5.6 % (ref 4.0–5.6)

## 2024-01-30 ENCOUNTER — Encounter (HOSPITAL_COMMUNITY): Payer: Self-pay

## 2024-01-30 ENCOUNTER — Ambulatory Visit
Admission: RE | Admit: 2024-01-30 | Discharge: 2024-01-30 | Disposition: A | Discharge status: Home / Self Care | Payer: Medicare (Managed Care) | Source: Ambulatory Visit | Attending: Physician Assistant

## 2024-01-30 ENCOUNTER — Other Ambulatory Visit: Payer: Self-pay

## 2024-01-30 DIAGNOSIS — Z1231 Encounter for screening mammogram for malignant neoplasm of breast: Secondary | ICD-10-CM | POA: Insufficient documentation

## 2024-01-31 DIAGNOSIS — M19032 Primary osteoarthritis, left wrist: Secondary | ICD-10-CM

## 2024-02-03 ENCOUNTER — Ambulatory Visit (INDEPENDENT_AMBULATORY_CARE_PROVIDER_SITE_OTHER): Payer: Self-pay | Admitting: Student in an Organized Health Care Education/Training Program

## 2024-02-03 DIAGNOSIS — Z1231 Encounter for screening mammogram for malignant neoplasm of breast: Secondary | ICD-10-CM

## 2024-02-05 ENCOUNTER — Other Ambulatory Visit: Payer: Self-pay

## 2024-02-06 ENCOUNTER — Other Ambulatory Visit: Payer: Self-pay

## 2024-02-06 ENCOUNTER — Ambulatory Visit: Payer: Medicare (Managed Care) | Attending: Urology | Admitting: Urology

## 2024-02-06 ENCOUNTER — Encounter (INDEPENDENT_AMBULATORY_CARE_PROVIDER_SITE_OTHER): Payer: Self-pay | Admitting: Urology

## 2024-02-06 VITALS — BP 128/80 | HR 83 | Temp 96.8°F | Ht 65.0 in | Wt 217.6 lb

## 2024-02-06 DIAGNOSIS — N393 Stress incontinence (female) (male): Secondary | ICD-10-CM | POA: Insufficient documentation

## 2024-02-06 DIAGNOSIS — N3946 Mixed incontinence: Secondary | ICD-10-CM

## 2024-02-06 DIAGNOSIS — R82998 Other abnormal findings in urine: Secondary | ICD-10-CM

## 2024-02-06 DIAGNOSIS — N3281 Overactive bladder: Secondary | ICD-10-CM

## 2024-02-06 LAB — POC URINALYSIS (RESULTS)
BILIRUBIN: NEGATIVE mg/dL
GLUCOSE: NEGATIVE mg/dL
KETONES: NEGATIVE mg/dL
LEUKOCYTES: NEGATIVE WBCs/uL
NITRITE: NEGATIVE
PH: 5.5 (ref 5.0–8.0)
PROTEIN: NEGATIVE mg/dL
SPECIFIC GRAVITY: 1.025 (ref 1.005–1.030)
UROBILINOGEN: 1 mg/dL

## 2024-02-06 NOTE — Progress Notes (Signed)
 [facility]  DEPARTMENT OF UROLOGY   CLINIC VISIT NOTE    PATIENT NAME: Sahana Boyland NUMBER: [id number]   DATE OF SERVICE: 02/06/2024   DATE OF BIRTH: 1955/12/06    Chief Complaint: OAB, incontinence    Assessment:  Overactive bladder  Stress and urge urinary incontinence  Small-capacity bladder  Concern for UTI    Plan:  - Patient with refractory LUTS despite multiple different oral medications. We reviewed options for care including continued oral medications vs botox injection vs Interstim placement. Risks, benefits, alternatives of each option were reviewed. Patient voiced a preference for botox injection  - Will schedule for botox in clinic next available  - May continue myrbetriq as prescribed  - Urine sent for culture    HPI: Oceanna Arruda is a 68 y.o. female who presents for follow up regarding her OAB and incontinence. She states that she did use the Solifenacin for three months without improvement. Was switched to myrbetriq by her PCP 2 months ago and this too was not helpful. She continues to experience urge incontinence daily, she will suddenly feel the need to void and will be unable to make it to the bathroom before she does so. She also has leakage with coughing. Wears panty liners and goes through multiple per day. Also complains of foul-smelling urine though has no dysuria or hematuria. No fevers.    I personally reviewed outside records and summarized in HPI    PMHx:   Past Medical History:   Diagnosis Date    Anxiety     Arthritis     Awareness under anesthesia     woke during colonoscopy    Cancer (CMS HCC)     internal skin cancer    Chest pain 02/19/2016    Chronic pain     lower back    Depression     Dyspnea on exertion     GERD (gastroesophageal reflux disease)     controlled w/med    H/O hearing loss     b/l, no aids    H/O urinary tract infection     Sept 2024- resolved    Headache     Hearing loss     Heart murmur 1979    benign    History of  coronary angioplasty with insertion of stent 05/09/2023    Hyperlipidemia LDL goal < 100 08/29/2013    denies    Migraine     MINOR CAD (coronary artery disease) 11/10/2012    no treatment other than cholesterol medications; follows regularly with K.Dalton, PA    Multinodular goiter     Muscle weakness     neck     Myocardial infarction 05/09/2023    04/18/23, sees [place] cards    Neck problem     spinal stenosis    Obesity     Peripheral edema     Rash     eczema    Rheumatic fever     in 1974 no complications    S/P thyroidectomy     Shortness of breath     DOE    Sleep apnea     Squamous cell carcinoma 02/06/2015    right side of neck    Thyroid disorder     s/p part thyroidectomy    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    Tinnitus     Vaginal prolapse 2012    surgery improved significantly    Wears glasses  PSHx:   Past Surgical History:   Procedure Laterality Date    CATARACT EXTRACTION Right 12/07/2022    CHALAZION EXCISION Left 12/22/2021    and conj lesion excision from caruncle    COLONOSCOPY  01/06/2009    COLONOSCOPY performed by Doria Garden, AHMED F at [facility] OR ENDO    GASTROSCOPY  03/05/2010    GASTROSCOPY performed by Larance Plater at [facility] OR ENDO    GASTROSCOPY WITH BIOPSY  03/05/2010    GASTROSCOPY WITH BIOPSY performed by Larance Plater at [facility] OR ENDO    HX ADENOIDECTOMY      HX ANKLE FRACTURE TX  2007    left distal fibula, casted    HX APPENDECTOMY      HX CHOLECYSTECTOMY      HX COLONOSCOPY      HX CYSTOCELE REPAIR  09/24/2009    HX HAND SURGERY  2012    for Carpal tunnel : right side treated    HX HEART CATHETERIZATION      HX HYSTERECTOMY      HX OOPHORECTOMY      left ovary removed    HX PAROTIDECTOMY       "surgical clamp"     HX PARTIAL THYROIDECTOMY Right 04/05/2019    R hemithyroidectomy for nodule, final path follicular adenoma    HX TONSILLECTOMY      HX TOTAL VAGINAL HYSTERECTOMY  1979    HX WISDOM TEETH EXTRACTION      HX WRIST FRACTURE TX Left 2007    FOOSH injury    PARATHYROID GLAND  SURGERY      PARS PLANA VITRECTOMY Right 11/02/2021    Pars plana vitrectomy, nternal limiting membrane (ILM) and epiretinal membrane (ERM) peel, Injection of 20%  sf6 gas, Subtenon's injection of Cefuroxime 50 mg and Dexamethasone 2 mg    PB REVISE ULNAR NERVE AT ELBOW Left 1979    PB UPPER GI ENDOSCOPY,BIOPSY  12/19/2007    patulous GE junction zone, erythema, nonerosive GERD    SEPTOPLASTY           Family Hx: Aaron Aas  Family Medical History:       Problem Relation (Age of Onset)    Bipolar Disorder Daughter, Paternal Uncle    Breast Cancer Paternal Aunt, Paternal Aunt, Paternal Aunt, Paternal Aunt, Paternal Aunt, Other    Cancer Paternal Aunt, Paternal Aunt, Paternal Aunt, Paternal Aunt, Paternal Aunt, Other (43)    Congestive Heart Failure Father (50)    Coronary Artery Disease Mother (78), Father    Diabetes Mother    Heart Attack Father, Sister, Sister    High Cholesterol Mother    Hypertension (High Blood Pressure) Mother, Sister, Sister, Brother    Kidney Disease Sister    Leukemia Paternal Uncle    Stroke Paternal Grandmother    Thyroid Disease Sister            Social Hx:   Social History     Socioeconomic History    Marital status: Divorced     Spouse name: Not on file    Number of children: 3    Years of education: Not on file    Highest education level: Not on file   Occupational History    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     Employer: NOT EMPLOYED     Comment: occasionally   Tobacco Use    Smoking status: Former     Current packs/day: 0.00     Average packs/day: 2.0 packs/day for 40.0 years (80.0 ttl  pk-yrs)     Types: Cigarettes     Start date: 01/22/1965     Quit date: 01/22/2005     Years since quitting: 19.0    Smokeless tobacco: Never   Vaping Use    Vaping status: Never Used   Substance and Sexual Activity    Alcohol use: No     Alcohol/week: 0.0 standard drinks of alcohol    Drug use: No    Sexual activity: Yes     Partners: Male   Other Topics Concern    Abuse/Domestic Violence Not Asked    Breast  Self Exam Not Asked    Caffeine Concern Not Asked    Calcium intake adequate Not Asked    Computer Use Not Asked    Exercise Concern Not Asked    Helmet Use Not Asked    Seat Belt Not Asked    Special Diet Not Asked    Sunscreen used Not Asked    Uses Cane No    Uses walker No    Uses wheelchair No    Right hand dominant Yes    Left hand dominant No    Ambidextrous No    Ability to Walk 1 Flight of Steps without SOB/CP Yes    Routine Exercise No    Ability to Walk 2 Flight of Steps without SOB/CP No     Comment: SOB    Unable to Ambulate Not Asked    Total Care Not Asked    Ability To Do Own ADL's Yes    Uses Walker No    Other Activity Level Yes     Comment: housework     Uses Cane No    Drives Not Asked    Shift Work No    Unusual Sleep-Wake Schedule No   Social History Narrative    Right handed.      Social Determinants of Health     Financial Resource Strain: Not on file   Transportation Needs: Not on file   Social Connections: Not on file   Intimate Partner Violence: Not on file   Housing Stability: Not on file     Medications:   Current Outpatient Medications   Medication Sig    albuterol sulfate (PROAIR HFA) 90 mcg/actuation Inhalation oral inhaler Take 2 Puffs by inhalation Every 4 hours as needed    aspirin 81 mg Oral Tablet, Chewable Chew 1 Tablet (81 mg total) Every morning    baclofen (LIORESAL) 5 mg Oral Tablet Take 1 Tablet (5 mg total) by mouth Three times a day    BRILINTA 90 mg Oral Tablet Take 1 Tablet (90 mg total) by mouth Twice daily    clobetasoL (TEMOVATE) 0.05 % Ointment Apply topically Twice daily    desvenlafaxine (PRISTIQ) 100 mg Oral Tablet Sustained Release 24 hr Take 1 Tablet (100 mg total) by mouth Once a day    diclofenac sodium (VOLTAREN) 1 % Gel Apply topically Four times a day - before meals and bedtime    famotidine (PEPCID) 40 mg Oral Tablet Take 1 Tablet (40 mg total) by mouth Twice daily    gabapentin (NEURONTIN) 800 mg Oral Tablet Take 1 Tablet (800 mg total) by mouth Twice  daily    ipratropium bromide (ATROVENT) 42 mcg (0.06 %) Nasal Spray, Non-Aerosol Administer 2 Sprays into affected nostril(s) Three times a day for 90 days    isosorbide mononitrate (IMDUR) 30 mg Oral Tablet Sustained Release 24 hr Take 1 Tablet (30 mg total) by  mouth Every morning    metoprolol succinate (TOPROL-XL) 25 mg Oral Tablet Sustained Release 24 hr Take 1 Tablet (25 mg total) by mouth Once a day    mirabegron (MYRBETRIQ) 25 mg Oral Tablet Sustained Release 24 hr Take 1 Tablet (25 mg total) by mouth Once a day    montelukast (SINGULAIR) 10 mg Oral Tablet Take 1 Tablet (10 mg total) by mouth Every evening    nitroGLYCERIN (NITROSTAT) 0.4 mg Sublingual Tablet, Sublingual DISSOLVE 1 TABLET UNDER THE TONGUE AS NEEDED FOR CHEST PAIN EVERY 5 MINUTES UP TO 3 TIMES. IF NO RELIEF CALL 911.    ondansetron (ZOFRAN) 4 mg Oral Tablet Take 1 Tablet (4 mg total) by mouth Every 8 hours as needed for Nausea/Vomiting    pantoprazole (PROTONIX) 40 mg Oral Tablet, Delayed Release (E.C.) Take 1 Tablet (40 mg total) by mouth Twice daily    rosuvastatin (CRESTOR) 40 mg Oral Tablet Take 1 Tablet (40 mg total) by mouth Every evening    tiotropium bromide (SPIRIVA RESPIMAT) 2.5 mcg/actuation Inhalation oral inhaler Take 2 Inhalations (2 Puffs total) by inhalation Once a day     Allergies:   Allergies   Allergen Reactions    Cymbalta [Duloxetine]      Rash on feet and hands    Blue Dye      Told to avoid due to patch test result by [facility] [place]    Flavoring Agent  Other Adverse Reaction (Add comment)     Patient unsure of reaction    Lipitor [Atorvastatin] Myalgia     Muscle pain    Lisinopril  Other Adverse Reaction (Add comment)     Cough      Nickel      Blisters  "Allergic to all kinds of metal"     Codeine Itching     RASH    Mobic [Meloxicam] Nausea/ Vomiting     Nervous and shakes     Naprosyn [Naproxen] Nausea/ Vomiting    Nsaids (Non-Steroidal Anti-Inflammatory Drug) Nausea/ Vomiting       ROS:  All systems are negative except  as noted in HPI.    Physical Exam:  BP 128/80 (Patient Position: Sitting)   Pulse 83   Temp 36 C (96.8 F)   Ht 1.651 m (5\' 5" )   Wt 98.7 kg (217 lb 9.5 oz)   SpO2 98%   BMI 36.21 kg/m       General - alert/oriented; NAD  Abdomen - Non-tender, non-distended  GU system - Deferred    Labs:  No results found. However, due to the size of the patient record, not all encounters were searched. Please check Results Review for a complete set of results.     Imaging:  No results found.     Li Bobo  was offered the opportunity to voice questions or concerns regarding today's visit, the plan of care set forth to them by provider, and actions that have been or will be taken for them on their behalf. [name] did not have any questions or concerns at the end of the visit today, and furthermore, Noralee Dutko was satisfied with the above mentioned plan of care and follow up. Vester Titsworth is agreeable to the above plan of care and will follow up with urology or his PCP sooner should their condition worsen or the need arise.       Frazier Richards, MD  [facility]  Department of Urology  PGY3  Resident  02/06/2024 10:56        I saw and examined the patient.  I reviewed the resident's note.  I agree with the findings and plan of care as documented in the resident's note.  Any exceptions/additions are edited/noted.    Cheryle Corral, MD

## 2024-02-07 ENCOUNTER — Ambulatory Visit (HOSPITAL_COMMUNITY): Payer: Self-pay | Admitting: CARDIOVASCULAR DISEASE

## 2024-02-07 NOTE — Addendum Note (Signed)
 Addended by: Rosellen Conners ANN on: 02/07/2024 11:18 AM     Modules accepted: Orders

## 2024-02-09 ENCOUNTER — Other Ambulatory Visit: Payer: Self-pay

## 2024-02-09 NOTE — Progress Notes (Cosign Needed)
 Gastroenterology and Hepatology   Clinic Note    Name: [name]  MRN: [id number]  Date of Birth:  1956/08/15  Date of Service: 02/10/2024  Referring Provider: Felizardo Hotter, MD  Chief Complaint:   Chief Complaint   Patient presents with    Cough     HPI: [name] is a 67 y.o. female with hx of chronic cough presenting for chronic cough. Patient has been evaluated for this issue by pulmonology and ENT who had placed patient on Protonix  40mg  BID and Pepcid  40mg  with no improvement. They even performed left laryngeal nerve block with no response.   Patient has been having this since she had PCI done about a year ago. Has cough when she eats, drinks, lays down. Has been doing all the lifestyle modification and maximal medical therapy with PPI. Patient states she feels miserable.       PMH:  Past Medical History:   Diagnosis Date    Anxiety     Arthritis     Awareness under anesthesia     woke during colonoscopy    Cancer (CMS HCC)     internal skin cancer    Chest pain 02/19/2016    Chronic pain     lower back    Depression     Dyspnea on exertion     GERD (gastroesophageal reflux disease)     controlled w/med    H/O hearing loss     b/l, no aids    H/O urinary tract infection     Sept 2024- resolved    Headache     Hearing loss     Heart murmur 1979    benign    History of coronary angioplasty with insertion of stent 05/09/2023    Hyperlipidemia LDL goal < 100 08/29/2013    denies    Migraine     MINOR CAD (coronary artery disease) 11/10/2012    no treatment other than cholesterol medications; follows regularly with K.Dalton, PA    Multinodular goiter     Muscle weakness     neck     Myocardial infarction 05/09/2023    04/18/23, sees [place] cards    Neck problem     spinal stenosis    Obesity     Peripheral edema     Rash     eczema    Rheumatic fever     in 1974 no complications    S/P thyroidectomy     Shortness of breath     DOE    Sleep apnea     Squamous cell carcinoma 02/06/2015    right side  of neck    Thyroid  disorder     s/p part thyroidectomy    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    Tinnitus     Vaginal prolapse 2012    surgery improved significantly    Wears glasses          PSH:  Past Surgical History:   Procedure Laterality Date    CATARACT EXTRACTION Right 12/07/2022    CHALAZION EXCISION Left 12/22/2021    and conj lesion excision from caruncle    COLONOSCOPY  01/06/2009    COLONOSCOPY performed by Doria Garden, AHMED F at [facility] OR ENDO    GASTROSCOPY  03/05/2010    GASTROSCOPY performed by Reina Cara, SWATI at [facility] OR ENDO    GASTROSCOPY WITH BIOPSY  03/05/2010    GASTROSCOPY WITH BIOPSY performed by Larance Plater at [facility] OR ENDO  HX ADENOIDECTOMY      HX ANKLE FRACTURE TX  2007    left distal fibula, casted    HX APPENDECTOMY      HX CHOLECYSTECTOMY      HX COLONOSCOPY      HX CYSTOCELE REPAIR  09/24/2009    HX HAND SURGERY  2012    for Carpal tunnel : right side treated    HX HEART CATHETERIZATION      HX HYSTERECTOMY      HX OOPHORECTOMY      left ovary removed    HX PAROTIDECTOMY       "surgical clamp"     HX PARTIAL THYROIDECTOMY Right 04/05/2019    R hemithyroidectomy for nodule, final path follicular adenoma    HX TONSILLECTOMY      HX TOTAL VAGINAL HYSTERECTOMY  1979    HX WISDOM TEETH EXTRACTION      HX WRIST FRACTURE TX Left 2007    FOOSH injury    PARATHYROID  GLAND SURGERY      PARS PLANA VITRECTOMY Right 11/02/2021    Pars plana vitrectomy, nternal limiting membrane (ILM) and epiretinal membrane (ERM) peel, Injection of 20%  sf6 gas, Subtenon's injection of Cefuroxime  50 mg and Dexamethasone  2 mg    PB REVISE ULNAR NERVE AT ELBOW Left 1979    PB UPPER GI ENDOSCOPY,BIOPSY  12/19/2007    patulous GE junction zone, erythema, nonerosive GERD    SEPTOPLASTY           FAMHX:  Family Medical History:       Problem Relation (Age of Onset)    Bipolar Disorder Daughter, Paternal Uncle    Breast Cancer Paternal Aunt, Paternal Aunt, Paternal Aunt, Paternal Aunt, Paternal Aunt, Other    Cancer  Paternal Aunt, Paternal Aunt, Paternal Aunt, Paternal Aunt, Paternal Aunt, Other (43)    Congestive Heart Failure Father (50)    Coronary Artery Disease Mother (37), Father    Diabetes Mother    Heart Attack Father, Sister, Sister    High Cholesterol Mother    Hypertension (High Blood Pressure) Mother, Sister, Sister, Brother    Kidney Disease Sister    Leukemia Paternal Uncle    Stroke Paternal Grandmother    Thyroid  Disease Sister            SOCHX:  Social History     Socioeconomic History    Marital status: Divorced    Number of children: 3   Occupational History    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     Employer: NOT EMPLOYED     Comment: occasionally   Tobacco Use    Smoking status: Former     Current packs/day: 0.00     Average packs/day: 2.0 packs/day for 40.0 years (80.0 ttl pk-yrs)     Types: Cigarettes     Start date: 01/22/1965     Quit date: 01/22/2005     Years since quitting: 19.0    Smokeless tobacco: Never   Vaping Use    Vaping status: Never Used   Substance and Sexual Activity    Alcohol use: No     Alcohol/week: 0.0 standard drinks of alcohol    Drug use: No    Sexual activity: Yes     Partners: Male   Other Topics Concern    Uses Cane No    Uses walker No    Uses wheelchair No    Right hand dominant Yes    Left hand dominant No    Ambidextrous  No    Ability to Walk 1 Flight of Steps without SOB/CP Yes    Routine Exercise No    Ability to Walk 2 Flight of Steps without SOB/CP No     Comment: SOB    Ability To Do Own ADL's Yes    Uses Walker No    Other Activity Level Yes     Comment: housework     Uses Cane No    Shift Work No    Unusual Sleep-Wake Schedule No   Social History Narrative    Right handed.      Medications:  Outpatient Medications Marked as Taking for the 02/10/24 encounter (Office Visit) with Priscillia Fouch, MD   Medication Sig    albuterol  sulfate (PROAIR  HFA) 90 mcg/actuation Inhalation oral inhaler Take 2 Puffs by inhalation Every 4 hours as needed    aspirin  81 mg Oral Tablet,  Chewable Chew 1 Tablet (81 mg total) Every morning    baclofen  (LIORESAL ) 5 mg Oral Tablet Take 1 Tablet (5 mg total) by mouth Three times a day    BRILINTA 90 mg Oral Tablet Take 1 Tablet (90 mg total) by mouth Twice daily    clobetasoL  (TEMOVATE ) 0.05 % Ointment Apply topically Twice daily    desvenlafaxine  (PRISTIQ ) 100 mg Oral Tablet Sustained Release 24 hr Take 1 Tablet (100 mg total) by mouth Once a day    diclofenac  sodium (VOLTAREN ) 1 % Gel Apply topically Four times a day - before meals and bedtime    famotidine  (PEPCID ) 40 mg Oral Tablet Take 1 Tablet (40 mg total) by mouth Twice daily    gabapentin  (NEURONTIN ) 800 mg Oral Tablet Take 1 Tablet (800 mg total) by mouth Twice daily    ipratropium bromide  (ATROVENT ) 42 mcg (0.06 %) Nasal Spray, Non-Aerosol Administer 2 Sprays into affected nostril(s) Three times a day for 90 days    isosorbide  mononitrate (IMDUR ) 30 mg Oral Tablet Sustained Release 24 hr Take 1 Tablet (30 mg total) by mouth Every morning    metoprolol  succinate (TOPROL -XL) 25 mg Oral Tablet Sustained Release 24 hr Take 1 Tablet (25 mg total) by mouth Once a day    mirabegron  (MYRBETRIQ ) 25 mg Oral Tablet Sustained Release 24 hr Take 1 Tablet (25 mg total) by mouth Once a day    montelukast  (SINGULAIR ) 10 mg Oral Tablet Take 1 Tablet (10 mg total) by mouth Every evening    nitroGLYCERIN  (NITROSTAT ) 0.4 mg Sublingual Tablet, Sublingual DISSOLVE 1 TABLET UNDER THE TONGUE AS NEEDED FOR CHEST PAIN EVERY 5 MINUTES UP TO 3 TIMES. IF NO RELIEF CALL 911. (Patient taking differently: Every 5 minutes as needed)    ondansetron  (ZOFRAN ) 4 mg Oral Tablet Take 1 Tablet (4 mg total) by mouth Every 8 hours as needed for Nausea/Vomiting    pantoprazole  (PROTONIX ) 40 mg Oral Tablet, Delayed Release (E.C.) Take 1 Tablet (40 mg total) by mouth Twice daily    rosuvastatin  (CRESTOR ) 40 mg Oral Tablet Take 1 Tablet (40 mg total) by mouth Every evening    tiotropium bromide  (SPIRIVA RESPIMAT) 2.5 mcg/actuation  Inhalation oral inhaler Take 2 Inhalations (2 Puffs total) by inhalation Once a day       Objective:  Review of Systems  Other than ROS in the HPI, all other systems were negative.    Physical Exam  BP 120/71   Pulse 95   Temp 36.3 C (97.3 F) (Thermal Scan)   Ht 1.651 m (5\' 5" )   Wt 99.2 kg (  218 lb 11.1 oz)   SpO2 96%   BMI 36.39 kg/m       General: No apparent distress  Eyes:  Pupils equal and round  Mouth:  Mucous membranes moist  Neck:  Supple, trachea midline  Lungs:  Respirations are non labored, equal chest rising  Abdomen: Soft, non tender, non distended  Extremities:  No cyanosis, no edema  Neuro:  Grossly normal  Skin:  Warm and dry    Labs:  I have reviewed all pertinent lab values.     Imaging/Previous Endoscopy:  I have reviewed all pertinent imaging studies.     Assessment/Plan:  [name] is a 68 y.o. female with hx of chronic cough presenting for chronic cough. Patient has been evaluated for this issue by pulmonology and ENT who had placed patient on Protonix  40mg  and Pepcid  40mg  with no improvement. They even performed left laryngeal nerve block with no response.     No orders of the defined types were placed in this encounter.    Encounter Diagnoses   Name Primary?    Cough, unspecified type     Hiatal hernia      Cough  GERD  - Esophagram 07/2023 small hiatal hernia, but no GERD seen  - Plan to assess for signs of reflux with EGD, can add Impedence while on PPI to prove or disprove reflux  - continue symptomatic management  - already on BID PPI  - if able to prove reflux then can refer for TIF vs surgical fundoplication   - continue lifestyle modification, elevate HOB, drink water , avoid caffeine, avoid eating 2-3 hour prior to laying down, lose 10% of weight.  - will send a script for a bed wedge      CRC screening:  -Due for repeat surveillance scheduled already 07/19/24  -Last colon 06/2019 Three TA in cecum one in transverse    RTC in 6 months or sooner as needed    Demetrius Fine MD  PGY-4 Gastroenterology and Hepatology Fellow      This note was created using MModal fluency direct voice recognition system . All documents  are proof read as best as possible, but it may have misspelled words, incorrect words, or syntax and grammatical errors because of the imperfect nature of the system

## 2024-02-10 ENCOUNTER — Other Ambulatory Visit: Payer: Self-pay

## 2024-02-10 ENCOUNTER — Encounter (INDEPENDENT_AMBULATORY_CARE_PROVIDER_SITE_OTHER): Payer: Self-pay | Admitting: INTERNAL MEDICINE

## 2024-02-10 ENCOUNTER — Ambulatory Visit (HOSPITAL_BASED_OUTPATIENT_CLINIC_OR_DEPARTMENT_OTHER)
Admission: RE | Admit: 2024-02-10 | Discharge: 2024-02-10 | Disposition: A | Discharge status: Home / Self Care | Payer: Medicare (Managed Care) | Source: Ambulatory Visit

## 2024-02-10 ENCOUNTER — Ambulatory Visit (INDEPENDENT_AMBULATORY_CARE_PROVIDER_SITE_OTHER): Payer: Medicare (Managed Care) | Attending: Internal Medicine | Admitting: INTERNAL MEDICINE

## 2024-02-10 ENCOUNTER — Ambulatory Visit (INDEPENDENT_AMBULATORY_CARE_PROVIDER_SITE_OTHER): Payer: Self-pay | Admitting: Student in an Organized Health Care Education/Training Program

## 2024-02-10 ENCOUNTER — Ambulatory Visit (HOSPITAL_BASED_OUTPATIENT_CLINIC_OR_DEPARTMENT_OTHER): Payer: Medicare (Managed Care)

## 2024-02-10 VITALS — BP 122/74 | HR 113 | Temp 97.7°F | Ht 65.0 in | Wt 218.0 lb

## 2024-02-10 VITALS — BP 120/71 | HR 95 | Temp 97.3°F | Ht 65.0 in | Wt 218.7 lb

## 2024-02-10 DIAGNOSIS — R059 Cough, unspecified: Secondary | ICD-10-CM | POA: Insufficient documentation

## 2024-02-10 DIAGNOSIS — M130 Polyarthritis, unspecified: Secondary | ICD-10-CM | POA: Insufficient documentation

## 2024-02-10 DIAGNOSIS — M25561 Pain in right knee: Secondary | ICD-10-CM

## 2024-02-10 DIAGNOSIS — M25562 Pain in left knee: Secondary | ICD-10-CM

## 2024-02-10 DIAGNOSIS — K449 Diaphragmatic hernia without obstruction or gangrene: Secondary | ICD-10-CM | POA: Insufficient documentation

## 2024-02-10 DIAGNOSIS — K219 Gastro-esophageal reflux disease without esophagitis: Secondary | ICD-10-CM | POA: Insufficient documentation

## 2024-02-10 MED ORDER — DICLOFENAC 1 % TOPICAL GEL
Freq: Four times a day (QID) | CUTANEOUS | 2 refills | Status: DC
Start: 2024-02-10 — End: 2024-08-13

## 2024-02-10 NOTE — Progress Notes (Signed)
 [name]  [name]   Department of Family Medicine   Progress Note    [name]  MRN: [id number]  DOB: [birth year]  Date of Service: 02/10/2024    CHIEF COMPLAINT  Chief Complaint   Patient presents with    Knee Pain     SUBJECTIVE  [name] is a 68 y.o. female who presents to clinic for bilateral knee pain. Has arthritis, notes they have been worse for the past 2 days. Has been walking around more frequently preparing food at her church.     Past Medical History:   Diagnosis Date    Anxiety     Arthritis     Awareness under anesthesia     woke during colonoscopy    Cancer (CMS HCC)     internal skin cancer    Chest pain 02/19/2016    Chronic pain     lower back    Depression     Dyspnea on exertion     GERD (gastroesophageal reflux disease)     controlled w/med    H/O hearing loss     b/l, no aids    H/O urinary tract infection     Sept 2024- resolved    Headache     Hearing loss     Heart murmur 1979    benign    History of coronary angioplasty with insertion of stent 05/09/2023    Hyperlipidemia LDL goal < 100 08/29/2013    denies    Migraine     MINOR CAD (coronary artery disease) 11/10/2012    no treatment other than cholesterol medications; follows regularly with K.Dalton, PA    Multinodular goiter     Muscle weakness     neck     Myocardial infarction 05/09/2023    04/18/23, sees [place] cards    Neck problem     spinal stenosis    Obesity     Peripheral edema     Rash     eczema    Rheumatic fever     in 1974 no complications    S/P thyroidectomy     Shortness of breath     DOE    Sleep apnea     Squamous cell carcinoma 02/06/2015    right side of neck    Thyroid  disorder     s/p part thyroidectomy    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    Medications:   albuterol  sulfate (PROAIR  HFA)  90 mcg/actuation Inhalation oral inhaler, Take 2 Puffs by inhalation Every 4 hours as needed  aspirin  81 mg Oral Tablet, Chewable, Chew 1 Tablet (81 mg total) Every morning  baclofen  (LIORESAL ) 5 mg Oral Tablet, Take 1 Tablet (5 mg total) by mouth Three times a day  BRILINTA 90 mg Oral Tablet, Take 1 Tablet (90 mg total) by mouth Twice daily  clobetasoL  (TEMOVATE ) 0.05 % Ointment, Apply topically Twice daily  desvenlafaxine  (PRISTIQ ) 100 mg Oral Tablet Sustained Release 24 hr, Take 1 Tablet (100 mg total) by mouth Once a day  famotidine  (PEPCID ) 40 mg Oral Tablet, Take 1 Tablet (40 mg total) by mouth Twice daily  gabapentin  (NEURONTIN ) 800 mg Oral Tablet, Take 1 Tablet (800 mg total) by  mouth Twice daily  ipratropium bromide  (ATROVENT ) 42 mcg (0.06 %) Nasal Spray, Non-Aerosol, Administer 2 Sprays into affected nostril(s) Three times a day for 90 days  isosorbide  mononitrate (IMDUR ) 30 mg Oral Tablet Sustained Release 24 hr, Take 1 Tablet (30 mg total) by mouth Every morning  metoprolol  succinate (TOPROL -XL) 25 mg Oral Tablet Sustained Release 24 hr, Take 1 Tablet (25 mg total) by mouth Once a day  mirabegron  (MYRBETRIQ ) 25 mg Oral Tablet Sustained Release 24 hr, Take 1 Tablet (25 mg total) by mouth Once a day  montelukast  (SINGULAIR ) 10 mg Oral Tablet, Take 1 Tablet (10 mg total) by mouth Every evening  nitroGLYCERIN  (NITROSTAT ) 0.4 mg Sublingual Tablet, Sublingual, DISSOLVE 1 TABLET UNDER THE TONGUE AS NEEDED FOR CHEST PAIN EVERY 5 MINUTES UP TO 3 TIMES. IF NO RELIEF CALL 911.  ondansetron  (ZOFRAN ) 4 mg Oral Tablet, Take 1 Tablet (4 mg total) by mouth Every 8 hours as needed for Nausea/Vomiting  pantoprazole  (PROTONIX ) 40 mg Oral Tablet, Delayed Release (E.C.), Take 1 Tablet (40 mg total) by mouth Twice daily  rosuvastatin  (CRESTOR ) 40 mg Oral Tablet, Take 1 Tablet (40 mg total) by mouth Every evening  tiotropium bromide  (SPIRIVA RESPIMAT) 2.5 mcg/actuation Inhalation oral inhaler, Take 2 Inhalations (2 Puffs  total) by inhalation Once a day  diclofenac  sodium (VOLTAREN ) 1 % Gel, Apply topically Four times a day - before meals and bedtime    No facility-administered medications Barbara to visit.    Allergies:  Allergies   Allergen Reactions    Cymbalta  [Duloxetine ]      Rash on feet and hands    Blue Dye      Told to avoid due to patch test result by [facility] [place]    Flavoring Agent  Other Adverse Reaction (Add comment)     Patient unsure of reaction    Lipitor [Atorvastatin ] Myalgia     Muscle pain    Lisinopril  Other Adverse Reaction (Add comment)     Cough      Nickel      Blisters  "Allergic to all kinds of metal"     Codeine  Itching     RASH    Mobic  [Meloxicam ] Nausea/ Vomiting     Nervous and shakes     Naprosyn  [Naproxen ] Nausea/ Vomiting    Nsaids (Non-Steroidal Anti-Inflammatory Drug) Nausea/ Vomiting       OBJECTIVE  BP 122/74   Pulse (!) 113   Temp 36.5 C (97.7 F) (Thermal Scan)   Ht 1.651 m (5\' 5" )   Wt 98.9 kg (218 lb)   SpO2 95%   BMI 36.28 kg/m     Constitutional/General: no distress, well kempt  HEENT:  external ear without deformity,  mucous membranes moist, pharynx without injection or exudate  Lungs: clear to auscultation bilaterally  Cardiovascular: RRR, no murmur appreciated   Abdomen/Gastrointestinal: soft, non distended  Extremities: no cyanosis or edema; bilateral swelling around the patella  Skin: warm and dry, no rash  Neurologic: gait is normal, AOx3, CN 2-12 grossly intact  Psychiatric: normal affect and behavior    The ASCVD Risk score (Arnett DK, et al., 2019) failed to calculate for the following reasons:    Risk score cannot be calculated because patient has a medical history suggesting Barbara/existing ASCVD     ASSESSMENT/PLAN  Retaj Hilbun a 68 y.o. female presenting for APV      ICD-10-CM    1. Bilateral knee pain  M25.561 XR KNEE BILATERAL SPORT SERIES 4  OR MORE VIEWS EA    M25.562 Refer to Physical Therapy-EXTERNAL     Acute on Chronic  Suspect overuse injury  Continue  tylenol , Voltaren  gel  Rest legs as possible  Pt script given, as able     2. Arthritis of multiple sites  M13.0 diclofenac  sodium (VOLTAREN ) 1 % Gel     See above       Orders Placed This Encounter    XR KNEE BILATERAL SPORT SERIES 4 OR MORE VIEWS EA    Refer to Physical Therapy-EXTERNAL    diclofenac  sodium (VOLTAREN ) 1 % Gel       Return if symptoms worsen or fail to improve.    Dail Drought, DO 02/10/2024, 14:44      I saw and examined the patient.  I reviewed the resident's note.  I agree with the findings and plan of care as documented in the resident's note.  Any exceptions/additions are edited/noted.    Enrique Harvest, MD

## 2024-02-10 NOTE — Nursing Note (Signed)
 02/10/24 1339   PHQ 9 (follow up)   Little interest or pleasure in doing things. 2   Feeling down, depressed, or hopeless 2   Trouble falling or staying asleep, or sleeping too much. 2   Feeling tired or having little energy 3   Poor appetite or overeating 1   Feeling bad about yourself/ that you are a failure in the past 2 weeks? 0   Trouble concentrating on things in the past 2 weeks? 3   Moving/Speaking slowly or being fidgety or restless  in the past 2 weeks? 3   Thoughts that you would be better off DEAD, or of hurting yourself in some way. 0   If you checked off any problems, how difficult have these problems made it for you to do your work, take care of things at home, or get along with other people? Very difficult   PHQ 9 Total 16   Interpretation of Total Score Moderate/Severe depression

## 2024-02-13 ENCOUNTER — Telehealth (INDEPENDENT_AMBULATORY_CARE_PROVIDER_SITE_OTHER): Payer: Self-pay | Admitting: INTERNAL MEDICINE

## 2024-02-15 DIAGNOSIS — M170 Bilateral primary osteoarthritis of knee: Secondary | ICD-10-CM

## 2024-02-16 ENCOUNTER — Ambulatory Visit (HOSPITAL_BASED_OUTPATIENT_CLINIC_OR_DEPARTMENT_OTHER): Payer: Self-pay

## 2024-02-20 ENCOUNTER — Encounter (INDEPENDENT_AMBULATORY_CARE_PROVIDER_SITE_OTHER): Payer: Self-pay | Admitting: Gastroenterology

## 2024-02-24 ENCOUNTER — Ambulatory Visit (INDEPENDENT_AMBULATORY_CARE_PROVIDER_SITE_OTHER): Payer: Self-pay | Admitting: Optometrist

## 2024-02-27 ENCOUNTER — Telehealth (INDEPENDENT_AMBULATORY_CARE_PROVIDER_SITE_OTHER): Payer: Self-pay | Admitting: INTERNAL MEDICINE

## 2024-02-27 NOTE — Telephone Encounter (Signed)
 DME Order for bed wedge mailed to the pt.    Larry Poag, Ambulatory Care Assistant

## 2024-02-29 ENCOUNTER — Ambulatory Visit: Payer: Medicare (Managed Care) | Attending: Physician Assistant | Admitting: Physician Assistant

## 2024-02-29 ENCOUNTER — Other Ambulatory Visit: Payer: Self-pay

## 2024-02-29 ENCOUNTER — Ambulatory Visit (HOSPITAL_BASED_OUTPATIENT_CLINIC_OR_DEPARTMENT_OTHER): Payer: Self-pay | Admitting: Student in an Organized Health Care Education/Training Program

## 2024-02-29 VITALS — BP 130/76 | HR 84 | Temp 98.2°F | Resp 16 | Ht 65.0 in | Wt 217.6 lb

## 2024-02-29 DIAGNOSIS — R053 Chronic cough: Secondary | ICD-10-CM | POA: Insufficient documentation

## 2024-02-29 DIAGNOSIS — M25561 Pain in right knee: Secondary | ICD-10-CM | POA: Insufficient documentation

## 2024-02-29 DIAGNOSIS — H9202 Otalgia, left ear: Secondary | ICD-10-CM | POA: Insufficient documentation

## 2024-02-29 DIAGNOSIS — M25562 Pain in left knee: Secondary | ICD-10-CM | POA: Insufficient documentation

## 2024-02-29 DIAGNOSIS — M179 Osteoarthritis of knee, unspecified: Secondary | ICD-10-CM | POA: Insufficient documentation

## 2024-02-29 MED ORDER — ACETAMINOPHEN 500 MG TABLET
1000.0000 mg | ORAL_TABLET | Freq: Three times a day (TID) | ORAL | 1 refills | Status: AC | PRN
Start: 2024-02-29 — End: ?

## 2024-02-29 MED ORDER — AMOXICILLIN 875 MG-POTASSIUM CLAVULANATE 125 MG TABLET
1.0000 | ORAL_TABLET | Freq: Two times a day (BID) | ORAL | 0 refills | Status: AC
Start: 2024-02-29 — End: 2024-03-07

## 2024-02-29 NOTE — Progress Notes (Signed)
 Department of Family Medicine   Progress Note    [name]  MRN: [id number]  DOB: 1955/12/09  Date of Service: 02/29/2024    CHIEF COMPLAINT  Chief Complaint   Patient presents with    Follow Up 3 Months     Cough         SUBJECTIVE  [name] is a 68 y.o. female who presents to clinic for routine monitoring and worsening pain.     Complaining of knee pain bilaterally. States that she had xrays recently that showed arthritic changes. She is doing exercises at home that she had from PT previously. They are helping. Has not been taking anything for pain because she feels Tylenol  does not help and she cannot take NSAIDs due to Brilinta.     She was also seen at Urgent Care for left ear pain. She was treated with prednisone  40 mg x 5 days. States that they have been keeping her awake despite taking them in the morning. She is still having pain and her cough continues to be persistent. No shortness of breath, fevers, or chills.       Review of Systems:  Positive ROS discussed in HPI, otherwise all other systems negative.      Medications:   albuterol  sulfate (PROAIR  HFA) 90 mcg/actuation Inhalation oral inhaler, Take 2 Puffs by inhalation Every 4 hours as needed  aspirin  81 mg Oral Tablet, Chewable, Chew 1 Tablet (81 mg total) Every morning  baclofen  (LIORESAL ) 5 mg Oral Tablet, Take 1 Tablet (5 mg total) by mouth Three times a day  BRILINTA 90 mg Oral Tablet, Take 1 Tablet (90 mg total) by mouth Twice daily  clobetasoL  (TEMOVATE ) 0.05 % Ointment, Apply topically Twice daily  desvenlafaxine  (PRISTIQ ) 100 mg Oral Tablet Sustained Release 24 hr, Take 1 Tablet (100 mg total) by mouth Once a day  diclofenac  sodium (VOLTAREN ) 1 % Gel, Apply topically Four times a day - before meals and bedtime  famotidine  (PEPCID ) 40 mg Oral Tablet, Take 1 Tablet (40 mg total) by mouth Twice daily  gabapentin  (NEURONTIN ) 800 mg Oral Tablet, Take 1 Tablet (800 mg total) by mouth Twice daily  ipratropium bromide  (ATROVENT ) 42  mcg (0.06 %) Nasal Spray, Non-Aerosol, Administer 2 Sprays into affected nostril(s) Three times a day for 90 days  isosorbide  mononitrate (IMDUR ) 30 mg Oral Tablet Sustained Release 24 hr, Take 1 Tablet (30 mg total) by mouth Every morning  metoprolol  succinate (TOPROL -XL) 25 mg Oral Tablet Sustained Release 24 hr, Take 1 Tablet (25 mg total) by mouth Once a day  mirabegron  (MYRBETRIQ ) 25 mg Oral Tablet Sustained Release 24 hr, Take 1 Tablet (25 mg total) by mouth Once a day  montelukast  (SINGULAIR ) 10 mg Oral Tablet, Take 1 Tablet (10 mg total) by mouth Every evening  nitroGLYCERIN  (NITROSTAT ) 0.4 mg Sublingual Tablet, Sublingual, DISSOLVE 1 TABLET UNDER THE TONGUE AS NEEDED FOR CHEST PAIN EVERY 5 MINUTES UP TO 3 TIMES. IF NO RELIEF CALL 911.  ondansetron  (ZOFRAN ) 4 mg Oral Tablet, Take 1 Tablet (4 mg total) by mouth Every 8 hours as needed for Nausea/Vomiting  pantoprazole  (PROTONIX ) 40 mg Oral Tablet, Delayed Release (E.C.), Take 1 Tablet (40 mg total) by mouth Twice daily  rosuvastatin  (CRESTOR ) 40 mg Oral Tablet, Take 1 Tablet (40 mg total) by mouth Every evening  tiotropium bromide  (SPIRIVA RESPIMAT) 2.5 mcg/actuation Inhalation oral inhaler, Take 2 Inhalations (2 Puffs total) by inhalation Once a day    No facility-administered medications prior  to visit.      Allergies:   Allergies   Allergen Reactions    Cymbalta  [Duloxetine ]      Rash on feet and hands    Blue Dye      Told to avoid due to patch test result by [facility] [place]    Flavoring Agent  Other Adverse Reaction (Add comment)     Patient unsure of reaction    Lipitor [Atorvastatin ] Myalgia     Muscle pain    Lisinopril  Other Adverse Reaction (Add comment)     Cough      Nickel      Blisters  "Allergic to all kinds of metal"     Codeine  Itching     RASH    Mobic  [Meloxicam ] Nausea/ Vomiting     Nervous and shakes     Naprosyn  [Naproxen ] Nausea/ Vomiting    Nsaids (Non-Steroidal Anti-Inflammatory Drug) Nausea/ Vomiting         OBJECTIVE  BP 130/76   Pulse  84   Temp 36.8 C (98.2 F)   Resp 16   Ht 1.651 m (5\' 5" )   Wt 98.7 kg (217 lb 9.5 oz)   SpO2 96%   BMI 36.21 kg/m       General: no distress, appears uncomfortable   HENT: TMs cloudy, mouth mucous membranes moist, pharynx without injection or exudate   Neck: no palpable lymphadenopathy    Lungs: clear to auscultation bilaterally  Cardiovascular: RRR, no murmur  Extremities: no cyanosis or edema  Skin: warm and dry, no rash  Neurologic: gait is normal, AOx3, CN 2-12 grossly intact  Psychiatric: normal affect and behavior        ASSESSMENT/PLAN  (M17.9) Osteoarthritis, knee  (primary encounter diagnosis)  (M25.561,  M25.562) Bilateral knee pain  Plan:   Chronic, uncontrolled   Referral placed to ortho for consideration of intervention  No NSAIDs due to chronic anticoagulation  Tylenol  500 mg prescribed and encouraged not to exceed 2,000 mg daily    Continue therapeutic injections at home     (R05.3) Chronic cough  Plan:   Chronic, uncontrolled  Specific etiology was never determined   Defer management to pulmonary and ENT     (H92.02) Acute otalgia, left  Plan:   Subacute   No obvious evidence of AOM, but will treat given progression of pain and persistence of symptoms for > 10 days      Orders Placed This Encounter    Referral to ORTHOPAEDIC -  [place] locations    amoxicillin -pot clavulanate (AUGMENTIN ) 875-125 mg Oral Tablet    acetaminophen  (TYLENOL ) 500 mg Oral Tablet         Return in about 6 months (around 08/31/2024) for Annual physical (40 min appt).        Dhrithi Riche Dalton, PA-C 02/29/2024, 14:14

## 2024-03-02 ENCOUNTER — Other Ambulatory Visit: Payer: Self-pay

## 2024-03-02 ENCOUNTER — Ambulatory Visit
Payer: Medicare (Managed Care) | Attending: Student in an Organized Health Care Education/Training Program | Admitting: Student in an Organized Health Care Education/Training Program

## 2024-03-02 ENCOUNTER — Encounter (HOSPITAL_BASED_OUTPATIENT_CLINIC_OR_DEPARTMENT_OTHER): Payer: Self-pay | Admitting: Student in an Organized Health Care Education/Training Program

## 2024-03-02 VITALS — Temp 97.7°F | Ht 65.87 in | Wt 219.8 lb

## 2024-03-02 DIAGNOSIS — M19032 Primary osteoarthritis, left wrist: Secondary | ICD-10-CM | POA: Insufficient documentation

## 2024-03-02 MED ORDER — BETAMETHASONE ACETATE AND SODIUM PHOS 6 MG/ML SUSPENSION FOR INJECTION
6.0000 mg | INTRAMUSCULAR | Status: AC
Start: 2024-03-02 — End: 2024-03-02
  Administered 2024-03-02: 6 mg via INTRAMUSCULAR

## 2024-03-02 MED ORDER — LIDOCAINE HCL 10 MG/ML (1 %) INJECTION SOLUTION
1.0000 mL | INTRAMUSCULAR | Status: AC
Start: 2024-03-02 — End: 2024-03-02
  Administered 2024-03-02: 10 mg via INTRAMUSCULAR

## 2024-03-02 NOTE — Progress Notes (Signed)
 [place], [place] [facility]  [address]  [place] [id number]  Operated by [facility]  DEPARTMENT OF ORTHOPAEDICS     Clinic Follow-Up Note    Patient: [name]  DOB: 1955-11-17  MRN: [id number]    Date: 03/02/2024    DIAGNOSIS:  No diagnosis found.       DATE OF INJURY:  12/04/2023    HISTORY OF PRESENT ILLNESS:  [name] is a 68 year old female, established patient, seen today for follow up of left wrist pain.  Patient states her wrist is feeling a little bit better that the previous visit.  States her thumb is really what hurts her the most and the swelling.  She has not been taking anything for pain.  States the brace does help alleviate some of the pain.  Denies numbness and tingling.    Allergies   Allergen Reactions    Cymbalta  [Duloxetine ]      Rash on feet and hands    Blue Dye      Told to avoid due to patch test result by [facility] [place]    Flavoring Agent  Other Adverse Reaction (Add comment)     Patient unsure of reaction    Lipitor [Atorvastatin ] Myalgia     Muscle pain    Lisinopril  Other Adverse Reaction (Add comment)     Cough      Nickel      Blisters  "Allergic to all kinds of metal"     Codeine  Itching     RASH    Mobic  [Meloxicam ] Nausea/ Vomiting     Nervous and shakes     Naprosyn  [Naproxen ] Nausea/ Vomiting    Nsaids (Non-Steroidal Anti-Inflammatory Drug) Nausea/ Vomiting            PHYSICAL EXAM:  VITAL SIGNS: Temp 36.5 C (97.7 F) (Temporal)   Ht 1.673 m (5' 5.87")   Wt 99.7 kg (219 lb 12.8 oz)   BMI 35.62 kg/m   GENERAL: The patient is well developed and well nourished, awake, alert and oriented and appropriate mood and affect. Normal gait and station.     LEFT UPPER EXTREMITY  SKIN: The skin over the wrist shows no rash, lesion or erythema and is comparable to the contralateral side.   SWELLING: There is positive focal swelling to STT joint  TENDERNESS: There is positive tenderness to palpation over the STT joint, mild  anatomical snuffbox tenderness; positive cmc joint tenderness  ROM: Active ROM of the hand and wrist demonstrates full flexion, extension, supination and pronation  LIGAMENTS:  No ligamentous exam performed today  MUSCLE:  Intact FPL, EPL, and interosseous muscles  SENSATION: Sensation intact to light touch in axillary, median, radial and ulnar nerve distributions.  VASCULAR: Palpable radial pulse     TESTS REVIEWED:  none    IMAGING:  no new images    ASSESSMENT:  [name]  is a 68 year old female with left STT arthritis.     PLAN:  Discussed treatment plan moving forward with the patient.  Discussed nonoperative management with the patient which included bracing versus steroid injection.  Ultimately, patient elected to move forward with a steroid injection into the STT joint. Dr.Wroblewski performed the injection. See procedure note for more details. Patient handled the procedure well.     The patient was given the opportunity to ask questions. All of their questions were answered to their satisfaction. The patient is in agreement with  our current treatment plan.     The patient will follow-up in 6 weeks    Patient was seen and evaluated in clinic today with the cosigning physician.    Antonio Baumgarten, PA-C  Department of Orthopaedic Surgery   03/02/24 at 14:55.

## 2024-03-04 ENCOUNTER — Other Ambulatory Visit: Payer: Self-pay

## 2024-03-05 ENCOUNTER — Encounter (INDEPENDENT_AMBULATORY_CARE_PROVIDER_SITE_OTHER): Payer: Self-pay | Admitting: Urology

## 2024-03-05 ENCOUNTER — Ambulatory Visit: Payer: Medicare (Managed Care) | Attending: Urology | Admitting: Urology

## 2024-03-05 ENCOUNTER — Other Ambulatory Visit: Payer: Self-pay

## 2024-03-05 DIAGNOSIS — R350 Frequency of micturition: Secondary | ICD-10-CM

## 2024-03-05 DIAGNOSIS — N393 Stress incontinence (female) (male): Secondary | ICD-10-CM | POA: Insufficient documentation

## 2024-03-05 DIAGNOSIS — Z87891 Personal history of nicotine dependence: Secondary | ICD-10-CM

## 2024-03-05 LAB — POC URINALYSIS (RESULTS)
BILIRUBIN: NEGATIVE mg/dL
GLUCOSE: NEGATIVE mg/dL
KETONES: NEGATIVE mg/dL
LEUKOCYTES: NEGATIVE WBCs/uL
NITRITE: NEGATIVE
PH: 5.5 (ref 5.0–8.0)
SPECIFIC GRAVITY: >=1.03 (ref 1.005–1.030)
UROBILINOGEN: 0.2 mg/dL

## 2024-03-05 MED ORDER — LIDOCAINE 2 % MUCOSAL JELLY IN APPLICATOR
Status: AC
Start: 2024-03-05 — End: 2024-03-05
  Administered 2024-03-05: 11 mL via TOPICAL

## 2024-03-05 NOTE — Progress Notes (Signed)
 [place]  [facility]   DEPARTMENT OF UROLOGY   OPERATION SUMMARY     PATIENT NAME: [name] [facility] NUMBER: [id number]   DATE OF SERVICE: 03/05/2024   DATE OF BIRTH: 07-13-1956     PREOPERATIVE DIAGNOSIS: history of refractory UF, extensive smoking history     POSTOPERATIVE DIAGNOSIS: same, no evidence of BT     NAME OF PROCEDURE: 1. Flexible cystourethroscopy     FINDINGS: see below  ANESTHESIA: 2% lidocaine  gel.     SURGEON(S): Cheryle Corral, MD   RESIDENT(S): n/a  ESTIMATED BLOOD LOSS: None.     COMPLICATIONS: None.  INDICATIONS FOR PROCEDURE: [name] is a 68 y.o. female who presented to the urology clinic for evaluation of refractory UF, tried/failed 3 anticholinergic agents    DESCRIPTION OF PROCEDURE: The patient was consented preoperatively and all risks and benefits of the procedure were explained including bleeding, infection, pain, bladder and urethral injury. She was placed into the lithotomy position and her genitalia were prepped and draped in the standard sterile fashion. 10cc of 2% Lidocaine  Uro-Jet were infused into the urethra. We passed a 17-French flexible cystoscope through the female urethra and into the bladder under direct visualization. Upon entering the bladder, panendoscopic views were obtained. Both ureteral orifices were observed to be of normal appearance and in their expected anatomic position. There was good efflux from both UOs. Upon viewing the bladder, there were no masses, lesions or stones noted.  The scope was then withdrawn, visualizing the entire urethra. There were no urethral abnormalities noted. The patient tolerated this procedure well and was then taken back to the post-operative area in stable condition.  Female pelvic examination was indertaken in the presence of a chaperone and revealed no POP or SUI.    DISPOSITION: The patient will be discharged home. They will follow up in a few weeks for cysto with Botox 100U in clinic.    Cheryle Corral, MD, MBA, FACS  Professor   Department of Urology    Certified in Pain Management  [employer]

## 2024-03-05 NOTE — Addendum Note (Signed)
 Addended by: Dodson Freestone on: 03/05/2024 12:12 PM     Modules accepted: Orders

## 2024-03-05 NOTE — Nursing Note (Signed)
 Patient arrived for scheduled cystoscopy for DX: refractory LUTS The patient voided prior to procedure for UA. The patient was first placed in the supine position. The patient's genitalia was prepped with Betadine using sterile technique. 2% Lidocaine  gel topical local urojet given into patient's urethra. S. Zaslau performed Cystoscopy. Patient tolerated procedure without difficulty.     Patient also advised that after the procedure, the patient may note urinary frequency, urgency, hematuria, or dysuria for a day. Increasing fluid intake helps to flush out the bladder, but caffeinated, carbonated, or alcoholic beverages should be avoided because they may irritate the bladder lining. Signs of infection, such as fever, chills, low back pain, or persistent blood in the urine, should be reported to the clinic or to go to the Emergency Room for evaluation.      Patient dressed and escorted to exam room for Physician to review plan of care with patient.  Patient to RTC as instructed.  Lauree Pomfret, RN

## 2024-03-06 ENCOUNTER — Encounter (HOSPITAL_BASED_OUTPATIENT_CLINIC_OR_DEPARTMENT_OTHER): Payer: Self-pay | Admitting: Student in an Organized Health Care Education/Training Program

## 2024-03-06 ENCOUNTER — Other Ambulatory Visit (HOSPITAL_BASED_OUTPATIENT_CLINIC_OR_DEPARTMENT_OTHER): Payer: Self-pay | Admitting: Physician Assistant

## 2024-03-06 DIAGNOSIS — M62838 Other muscle spasm: Secondary | ICD-10-CM

## 2024-03-06 DIAGNOSIS — N904 Leukoplakia of vulva: Secondary | ICD-10-CM

## 2024-03-06 NOTE — Telephone Encounter (Signed)
 Pt would like her medications refilled through [facility].   Margert Sheerer, Ambulatory Care Assistant

## 2024-03-06 NOTE — Procedures (Signed)
 [place], [place] [facility]  [address]   [place] [id number]  Operated by [facility]  Procedure Note    Name: [name] MRN:  [id number]   Date: 03/02/2024 DOB:  [birth year] (68 y.o.)         Med Joint Arthrocentesis  Indications: pain    I have discussed with the patient the risks of the injection including: infection; bleeding; damage to adjacent nerves/vessels/tendons/bones/ligaments; fat necrosis; tendon rupture; skin color changes. The patient verbalized their understanding of these risks and has asked me to proceed with the injection.    After obtaining verbal consent, I prepped the left wrist with alcohol swabs, ethyl chloride spray and chloraprep and then injected a 58mL:1mL mix of lidocaine  1%/[place] 6 mg/mL into the left wrist STT joint. The puncture was covered with a Band-Aid. The patient tolerated the procedure well.       Consent was given by the patient.           Denece Finger, MD

## 2024-03-06 NOTE — Addendum Note (Signed)
 Addended by: Denece Finger on: 03/06/2024 10:55 AM     Modules accepted: Level of Service

## 2024-03-07 ENCOUNTER — Encounter (INDEPENDENT_AMBULATORY_CARE_PROVIDER_SITE_OTHER): Payer: Self-pay

## 2024-03-07 ENCOUNTER — Other Ambulatory Visit: Payer: Self-pay

## 2024-03-07 ENCOUNTER — Ambulatory Visit: Payer: Medicare (Managed Care)

## 2024-03-07 DIAGNOSIS — H52202 Unspecified astigmatism, left eye: Secondary | ICD-10-CM | POA: Insufficient documentation

## 2024-03-07 DIAGNOSIS — Z961 Presence of intraocular lens: Secondary | ICD-10-CM | POA: Insufficient documentation

## 2024-03-07 DIAGNOSIS — H04123 Dry eye syndrome of bilateral lacrimal glands: Secondary | ICD-10-CM | POA: Insufficient documentation

## 2024-03-07 DIAGNOSIS — H524 Presbyopia: Secondary | ICD-10-CM | POA: Insufficient documentation

## 2024-03-07 DIAGNOSIS — H52201 Unspecified astigmatism, right eye: Secondary | ICD-10-CM | POA: Insufficient documentation

## 2024-03-07 DIAGNOSIS — H52203 Unspecified astigmatism, bilateral: Secondary | ICD-10-CM

## 2024-03-07 DIAGNOSIS — H5202 Hypermetropia, left eye: Secondary | ICD-10-CM | POA: Insufficient documentation

## 2024-03-07 DIAGNOSIS — H5211 Myopia, right eye: Secondary | ICD-10-CM | POA: Insufficient documentation

## 2024-03-07 MED ORDER — CLOBETASOL 0.05 % TOPICAL OINTMENT
TOPICAL_OINTMENT | Freq: Two times a day (BID) | CUTANEOUS | 3 refills | Status: DC
Start: 2024-03-07 — End: 2024-08-09

## 2024-03-07 MED ORDER — METOPROLOL SUCCINATE ER 25 MG TABLET,EXTENDED RELEASE 24 HR
25.0000 mg | ORAL_TABLET | Freq: Every day | ORAL | 3 refills | Status: DC
Start: 2024-03-07 — End: 2024-08-13

## 2024-03-07 MED ORDER — MIRABEGRON ER 25 MG TABLET,EXTENDED RELEASE 24 HR
25.0000 mg | ORAL_TABLET | Freq: Every day | ORAL | 3 refills | Status: DC
Start: 2024-03-07 — End: 2024-06-29

## 2024-03-07 MED ORDER — ROSUVASTATIN 40 MG TABLET
40.0000 mg | ORAL_TABLET | Freq: Every evening | ORAL | 3 refills | Status: DC
Start: 2024-03-07 — End: 2024-08-13

## 2024-03-07 MED ORDER — FAMOTIDINE 40 MG TABLET
40.0000 mg | ORAL_TABLET | Freq: Two times a day (BID) | ORAL | 3 refills | Status: DC
Start: 2024-03-07 — End: 2024-08-13

## 2024-03-07 MED ORDER — BACLOFEN 5 MG TABLET
5.0000 mg | ORAL_TABLET | Freq: Three times a day (TID) | ORAL | 3 refills | Status: DC
Start: 2024-03-07 — End: 2024-08-13

## 2024-03-07 MED ORDER — PANTOPRAZOLE 40 MG TABLET,DELAYED RELEASE
40.0000 mg | DELAYED_RELEASE_TABLET | Freq: Two times a day (BID) | ORAL | 3 refills | Status: DC
Start: 2024-03-07 — End: 2024-08-09

## 2024-03-07 NOTE — Progress Notes (Addendum)
 [facility]  [address]  [place] [address]  Operated by [facility]         Patient Name: [name]  MRN#: [id number]  Birthdate: June 06, 1956    Date of Service: 03/07/2024    Chief Complaint    Follow Up         [name] is a 68 y.o. female who presents today for evaluation/consultation of:  HPI       Follow Up     Additional comments: Pt here for CEE             Comments    Reports blurry Vision OU, feels current Rx needs updated  Denies diplopia  Denies pain or discomfort OU  Denies FOL or floaters OU  Pt told needs cataract OS removed 07/2023 by Dr. Roxene Cora: None            Last edited by Durwin Gills, COA on 03/07/2024  3:55 PM.        ROS    Positive for: Eyes  Last edited by Durwin Gills, COA on 03/07/2024  3:49 PM.         All other systems Negative    Durwin Gills, COA  03/07/2024, 15:55  OD Addition to HPI: Blurry vision, intermittent, OD, since last appointment, seems to be getting worse, eye gets watery.          ENCOUNTER DIAGNOSES     ICD-10-CM   1. Dry eye syndrome of both eyes  H04.123   2. Pseudophakia, right eye  Z96.1   3. 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 Hyperopia of left eye with astigmatism and presbyopia  H52.02    H52.202    H52.4     No orders of the defined types were placed in this encounter.      Ophthalmic Plan of Care    Assessment & Plan  Dry eye syndrome of both eyes  Pt. was educated on condition.  pt. educated on warm compresses and artificial tear use.  Continue to monitor.       Pseudophakia, right eye  Continue to monitor.       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 today.  Continue to monitor.       Hyperopia of left eye with astigmatism and presbyopia  New glasses prescribed for pt. today.  Continue to monitor.           Follow up:    Pt. educated to follow-up with Dr. Sheliah Deutscher as previously recommended.   Pt. educated to RTC ASAP if change in vision or ocular health noted.          Base Eye Exam       Visual Acuity (Snellen - Linear)         Right Left    Dist cc 20/200 20/30    Dist ph cc 20/60 NI      Correction: Glasses              Tonometry (icare, 4:10 PM)         Right Left    Pressure 9 10              Pupils         Pupils Shape React APD    Right PERRL Round Minimal None  Left PERRL Round Minimal None              Visual Fields (Counting fingers)         Right Left     Full Full              Extraocular Movement         Right Left     Full Full              Neuro/Psych       Oriented x3: Yes    Mood/Affect: Normal                  Additional Tests       Glare Testing         High    Right 20/60    Left 20/40                  Slit Lamp and Fundus Exam       External Exam         Right Left    External Normal Normal              Slit Lamp Exam         Right Left    Lids/Lashes Mild capped meibomian glands. Mild capped meibomian glands.    Conjunctiva/Sclera White and quiet White and quiet    Cornea Clear, Reduced TBUT Clear, Reduced TBUT    Anterior Chamber Deep and quiet Deep and quiet    Iris Round and reactive Round and reactive    Lens Posterior chamber intraocular lens Nuclear sclerosis    Anterior Vitreous Normal Normal              Fundus Exam         Right Left    Disc Normal Normal    C/D Ratio 0.45V/035H 0.35/0.35    Macula Flat and intact Flat and intact    Vessels Normal Normal    Difficult views secondary to undilated 90D.  Pt. does not want to dilate today.                  Refraction       Wearing Rx         Sphere Cylinder Axis    Right +2.25 +1.00 007    Left +1.50 +0.75 174              Manifest Refraction         Sphere Cylinder Axis Dist VA Add    Right -0.75 +1.50 020 20/20-- +3.00    Left +1.25 +1.25 170 20/20-- +3.00      Comments: [place] OU:  20/20-              Final Rx         Sphere Cylinder Axis Dist VA Add    Right -0.75 +1.50 020 20/20-- +3.00    Left +1.25 +1.25 170 20/20--  +3.00      Expiration Date: 03/07/2025                        I have seen and examined the above patient. I discussed the above diagnoses listed in the assessment and the above ophthalmic plan of care with the patient and patient's family. All questions were answered. I reviewed and, when necessary, made changes to the technician/resident note, documented ophthalmology exam, chief complaint, history of present illness, allergies, review  of systems, past medical, past surgical, family and social history. I personally reviewed and interpreted all testing and/or imaging performed at this visit and agree with the resident's or fellow's interpretation. Any exceptions/additions are edited/noted in the relevant encounter fields.      Ami Kail, [place]  03/07/2024, 17:02

## 2024-03-09 ENCOUNTER — Other Ambulatory Visit (HOSPITAL_COMMUNITY): Payer: Self-pay | Admitting: Student in an Organized Health Care Education/Training Program

## 2024-03-09 MED ORDER — DESVENLAFAXINE SUCCINATE ER 100 MG TABLET,EXTENDED RELEASE 24 HR
100.0000 mg | ORAL_TABLET | Freq: Every day | ORAL | 1 refills | Status: DC
Start: 2024-03-09 — End: 2024-04-05

## 2024-03-09 NOTE — Telephone Encounter (Signed)
 Refill requested for the following medication(s). Last ordered:    desvenlafaxine  (PRISTIQ ) 100 mg Oral Tablet Sustained Release 24 hr Take 1 Tablet (100 mg total) by mouth Once a day Dispense: 90 Tablet, Refills: 2 ordered  10/06/2023 -- Lennis Rabon, MD [facility] [place], [place] - [address]       Pharmacy change requested [facility] pharmacy sent fax request on patient's behalf)    Last appointment: 10/06/23    Follow up:    04/05/24    Agustina Hosteller, RN  03/09/2024, 15:40

## 2024-03-13 ENCOUNTER — Other Ambulatory Visit (HOSPITAL_BASED_OUTPATIENT_CLINIC_OR_DEPARTMENT_OTHER): Payer: Self-pay | Admitting: Physician Assistant

## 2024-03-13 ENCOUNTER — Encounter (INDEPENDENT_AMBULATORY_CARE_PROVIDER_SITE_OTHER): Payer: Self-pay

## 2024-03-13 NOTE — Telephone Encounter (Signed)
 Regarding: Refill Request  ----- Message from [address] [facility] sent at 03/13/2024 12:37 PM EDT -----  Copied From CRM #[id number].[facility] (Pharmacy) called to request a prescription refill.   isosorbide  mononitrate (IMDUR ) 30 mg Oral Tablet Sustained Release 24 hr     Preferred Pharmacy       [facility] - [place], [place] - [address]    [address] [place] [address]    Phone: [phone] Fax: [phone]    Hours: Not open 24 hours

## 2024-03-14 NOTE — Telephone Encounter (Signed)
 Regarding: Kristi Dalton, PA, refill medication  ----- Message from Barton Like sent at 03/14/2024  1:25 PM EDT -----  Copied From CRM #[id number].[facility] (Pharmacy) called to request a prescription refill- 90 day supply.  The patient was getting through a local pharmacy and they can not transfer:     Disp Refills Start End   isosorbide  mononitrate (IMDUR ) 30 mg Oral Tablet Sustained Release 24 hr 90 Tablet 3 12/19/2023 -   Sig - Route: Take 1 Tablet (30 mg total) by mouth Every morning - Oral   Sent to pharmacy as: isosorbide  mononitrate ER 30 mg tablet,extended release 24 hr (IMDUR )   Class: E-Rx   Non-formulary Exception Code: [email or web address]/No Formulary Info Available   E-Prescribing Status: Receipt confirmed by pharmacy (12/19/2023  4:02 PM EST)     Preferred Pharmacy    [facility] - [place], [place] - [address]    [address] [place] [address]    Phone: [phone] Fax: [phone]    Hours: Not open 24 hours      Thank you,  Maryhelen Snide

## 2024-03-15 MED ORDER — ISOSORBIDE MONONITRATE ER 30 MG TABLET,EXTENDED RELEASE 24 HR
30.0000 mg | ORAL_TABLET | Freq: Every morning | ORAL | 3 refills | Status: DC
Start: 2024-03-15 — End: 2024-05-29

## 2024-04-02 ENCOUNTER — Other Ambulatory Visit (INDEPENDENT_AMBULATORY_CARE_PROVIDER_SITE_OTHER): Payer: Self-pay | Admitting: Urology

## 2024-04-02 DIAGNOSIS — N3281 Overactive bladder: Secondary | ICD-10-CM

## 2024-04-05 ENCOUNTER — Other Ambulatory Visit: Payer: Self-pay

## 2024-04-05 ENCOUNTER — Ambulatory Visit (INDEPENDENT_AMBULATORY_CARE_PROVIDER_SITE_OTHER): Payer: Medicare (Managed Care) | Admitting: Student in an Organized Health Care Education/Training Program

## 2024-04-05 VITALS — BP 132/64 | HR 89 | Resp 18 | Ht 65.0 in | Wt 221.6 lb

## 2024-04-05 DIAGNOSIS — F411 Generalized anxiety disorder: Secondary | ICD-10-CM

## 2024-04-05 DIAGNOSIS — F4310 Post-traumatic stress disorder, unspecified: Secondary | ICD-10-CM

## 2024-04-05 DIAGNOSIS — F332 Major depressive disorder, recurrent severe without psychotic features: Secondary | ICD-10-CM

## 2024-04-05 MED ORDER — DESVENLAFAXINE SUCCINATE ER 100 MG TABLET,EXTENDED RELEASE 24 HR
100.0000 mg | ORAL_TABLET | Freq: Every day | ORAL | 2 refills | Status: DC
Start: 2024-04-05 — End: 2024-07-31
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
## 2024-04-09 ENCOUNTER — Encounter (HOSPITAL_BASED_OUTPATIENT_CLINIC_OR_DEPARTMENT_OTHER): Payer: Self-pay

## 2024-04-11 ENCOUNTER — Other Ambulatory Visit: Payer: Self-pay

## 2024-04-12 ENCOUNTER — Ambulatory Visit (HOSPITAL_BASED_OUTPATIENT_CLINIC_OR_DEPARTMENT_OTHER)
Admission: RE | Admit: 2024-04-12 | Discharge: 2024-04-12 | Disposition: A | Discharge status: Home / Self Care | Source: Ambulatory Visit | Admitting: Radiology

## 2024-04-12 ENCOUNTER — Ambulatory Visit: Payer: Self-pay | Attending: Orthopaedic Surgery | Admitting: Orthopaedic Surgery

## 2024-04-12 VITALS — Temp 96.8°F | Ht 64.96 in | Wt 218.3 lb

## 2024-04-12 DIAGNOSIS — M25561 Pain in right knee: Secondary | ICD-10-CM | POA: Insufficient documentation

## 2024-04-12 DIAGNOSIS — M25562 Pain in left knee: Secondary | ICD-10-CM

## 2024-04-12 DIAGNOSIS — E66812 Obesity, class 2: Secondary | ICD-10-CM | POA: Insufficient documentation

## 2024-04-12 DIAGNOSIS — M179 Osteoarthritis of knee, unspecified: Secondary | ICD-10-CM | POA: Insufficient documentation

## 2024-04-12 NOTE — Progress Notes (Signed)
 [facility]    Chief Complaint:   Chief Complaint   Patient presents with    Knee Pain        HPI:  This is a 68 y.o. year old patient who presents with a Several month history of bilateral knee pain and swelling. Patient reports they have been getting worse since she fell on them a couple months prior   Pain is located over the anterior, lateral knee.    The pain limits the ability to walk, perform house work, perform yard work, and travel.   She has tried Advil  (ibuprofen ), Aleve  (naproxen ), Tylenol  (acetaminophen ), Intra-articular corticosteroid injection, Topical pain relievers, Bracing, and Physical therapy.  She rates the pain a 8/10 at its very worst.  Pain is worse with worse with walking and no relief and has night time pain as well Associated symptoms include catching, clicking, swelling, stiffness, nighttime pain, and giving way (instability).    She additionally reports being seen at the pain center due to pain radiating down her back to her knees and at time her knee pain shoots to her foot.     PMH:  Past Medical History:   Diagnosis Date    Anxiety     Arthritis     Awareness under anesthesia     woke during colonoscopy    Cancer (CMS HCC)     internal skin cancer    Chest pain 02/19/2016    Chronic pain     lower back    Depression     Dyspnea on exertion     GERD (gastroesophageal reflux disease)     controlled w/med    H/O hearing loss     b/l, no aids    H/O urinary tract infection     Sept 2024- resolved    Headache     Hearing loss     Heart murmur 1979    benign    History of coronary angioplasty with insertion of stent 05/09/2023    Hyperlipidemia LDL goal < 100 08/29/2013    denies    Migraine     MINOR CAD (coronary artery disease) 11/10/2012    no treatment other than cholesterol medications; follows regularly with K.Dalton, PA    Multinodular goiter     Muscle weakness     neck     Myocardial infarction 05/09/2023    04/18/23, sees [place] [facility]    Neck problem      spinal stenosis    Obesity     Peripheral edema     Rash     eczema    Rheumatic fever     in 1974 no complications    S/P thyroidectomy     Shortness of breath     DOE    Sleep apnea     Squamous cell carcinoma 02/06/2015    right side of neck    Thyroid  disorder     s/p part thyroidectomy    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    Tinnitus     Vaginal prolapse 2012    surgery improved significantly    Wears glasses          PSH:  Past Surgical History:   Procedure Laterality Date    CATARACT EXTRACTION Right 12/07/2022    CHALAZION EXCISION Left 12/22/2021    and conj lesion excision from caruncle    COLONOSCOPY  01/06/2009    COLONOSCOPY performed by Eulice Hickory F at [facility]  OR ENDO    GASTROSCOPY  03/05/2010    GASTROSCOPY performed by Larance Plater at [facility] OR ENDO    GASTROSCOPY WITH BIOPSY  03/05/2010    GASTROSCOPY WITH BIOPSY performed by Larance Plater at [facility] OR ENDO    HX ADENOIDECTOMY      HX ANKLE FRACTURE TX  2007    left distal fibula, casted    HX APPENDECTOMY      HX CHOLECYSTECTOMY      HX COLONOSCOPY      HX CYSTOCELE REPAIR  09/24/2009    HX HAND SURGERY  2012    for Carpal tunnel : right side treated    HX HEART CATHETERIZATION      HX HYSTERECTOMY      HX OOPHORECTOMY      left ovary removed    HX PAROTIDECTOMY       surgical clamp     HX PARTIAL THYROIDECTOMY Right 04/05/2019    R hemithyroidectomy for nodule, final path follicular adenoma    HX TONSILLECTOMY      HX TOTAL VAGINAL HYSTERECTOMY  1979    HX WISDOM TEETH EXTRACTION      HX WRIST FRACTURE TX Left 2007    FOOSH injury    PARATHYROID  GLAND SURGERY      PARS PLANA VITRECTOMY Right 11/02/2021    Pars plana vitrectomy, nternal limiting membrane (ILM) and epiretinal membrane (ERM) peel, Injection of 20%  sf6 gas, Subtenon's injection of Cefuroxime  50 mg and Dexamethasone  2 mg    PB REVISE ULNAR NERVE AT ELBOW Left 1979    PB UPPER GI ENDOSCOPY,BIOPSY  12/19/2007    patulous GE junction zone, erythema, nonerosive GERD    SEPTOPLASTY            Medications:  Current Medications[1]    Allergy:  Allergies[2]    Family History:    Family Medical History:       Problem Relation (Age of Onset)    Bipolar Disorder Daughter, Paternal Uncle    Breast Cancer Paternal Aunt, Paternal Aunt, Paternal Aunt, Paternal Aunt, Paternal Aunt, Other    Cancer Paternal Aunt, Paternal Aunt, Paternal Aunt, Paternal Aunt, Paternal Aunt, Other (56)    Congestive Heart Failure Father (50)    Coronary Artery Disease Mother (4), Father    Diabetes Mother    Heart Attack Father, Sister, Sister    High Cholesterol Mother    Hypertension (High Blood Pressure) Mother, Sister, Sister, Brother    Kidney Disease Sister    Leukemia Paternal Uncle    Stroke Paternal Grandmother    Thyroid  Disease Sister            Denies a family history of a hypercoagulable condition      Social History:    Occupation:  Retired        The remainder of the review of systems is negative    Exam:  Temp 36 C (96.8 F)   Ht 1.65 m (5' 4.96)   Wt 99 kg (218 lb 4.1 oz)   BMI 36.36 kg/m       Appearance:  Obese   HEENT is Head: Normocephalic, no lesions, without obvious abnormality.  Breathing is Abnormal/Mildly Labored  Gait:  Stable without assistive device and antalgic  Deformity:  mild valgus  ROM of the bilateral knee:  Flexion to 110, Limited extension 5 on Right 10 on left   Tenderness: severely  anterior and medial despite reporting lateral sided pain she denies any tenderness  there  Neuro:  normal   Vascular:  Warm well perfused   Reflexes:  not tested  Skin:  color normal, edema bilaterally           X-rays:  Today's images of the bilateral knee reveal mild to moderate medial compartment osteoarthritis.      Impression:      ICD-10-CM    1. Bilateral knee pain  M25.561 XR Knee Bilateral - [facility] 4 views each    M25.562       2. Osteoarthritis, knee  M17.9        Patient does have some osteoarthritis especially in the medial compartment L > R, but current arthritis is not severe enough  to warrant risks this patient would undergo for surgical intervention at this time. Patients chronic pain may not be relieved by surgery. Would refer to non-operative ortho to discuss non-operative options for patients pain.     Plan:    Treatment options were reviewed with the patient  - Referral to non-operative orthopedics   Discussed that patient's arthritis may eventually progress to need surgical intervention and at that time we would be happy to re-evaluate.    She will follow up prn  She was encouraged to call with questions or concerns.      Geary Kells, MD  04/12/2024, 10:05      I saw and examined the patient.  I reviewed the resident's note.  I agree with the findings and plan of care as documented in the resident's note.  Any exceptions/additions are edited/noted.  Fayetta Hoose, MD   Associate Professor Orthopaedic Surgery  [phone]  04/12/2024 19:31        Cc    Dalton, Kristi, PA-C  [address]  [place],  [place] [address]    Matthew Songster, PA-C  [address]  [place] [address]         [1]   Current Outpatient Medications:     acetaminophen  (TYLENOL ) 500 mg Oral Tablet, Take 2 Tablets (1,000 mg total) by mouth Three times a day as needed for Pain, Disp: 90 Tablet, Rfl: 1    albuterol  sulfate (PROAIR  HFA) 90 mcg/actuation Inhalation oral inhaler, Take 2 Puffs by inhalation Every 4 hours as needed, Disp: 18 g, Rfl: 0    aspirin  81 mg Oral Tablet, Chewable, Chew 1 Tablet (81 mg total) Every morning, Disp: , Rfl:     baclofen  (LIORESAL ) 5 mg Oral Tablet, Take 1 Tablet (5 mg total) by mouth Three times a day, Disp: 270 Tablet, Rfl: 3    BRILINTA 90 mg Oral Tablet, Take 1 Tablet (90 mg total) by mouth Twice daily, Disp: , Rfl:     clobetasoL  (TEMOVATE ) 0.05 % Ointment, Apply topically Twice daily, Disp: 60 g, Rfl: 3    desvenlafaxine  (PRISTIQ ) 100 mg Oral Tablet Sustained Release 24 hr, Take 1 Tablet (100 mg total) by mouth Daily, Disp: 90 Tablet, Rfl: 2    diclofenac   sodium (VOLTAREN ) 1 % Gel, Apply topically Four times a day - before meals and bedtime, Disp: 100 g, Rfl: 2    famotidine  (PEPCID ) 40 mg Oral Tablet, Take 1 Tablet (40 mg total) by mouth Twice daily, Disp: 180 Tablet, Rfl: 3    gabapentin  (NEURONTIN ) 800 mg Oral Tablet, Take 1 Tablet (800 mg total) by mouth Twice daily, Disp: 60 Tablet, Rfl: 2    ipratropium bromide  (ATROVENT ) 42 mcg (0.06 %) Nasal Spray, Non-Aerosol, Administer  2 Sprays into affected nostril(s) Three times a day for 90 days, Disp: 15 mL, Rfl: 5    isosorbide  mononitrate (IMDUR ) 30 mg Oral Tablet Sustained Release 24 hr, Take 1 Tablet (30 mg total) by mouth Every morning, Disp: 90 Tablet, Rfl: 3    metoprolol  succinate (TOPROL -XL) 25 mg Oral Tablet Sustained Release 24 hr, Take 1 Tablet (25 mg total) by mouth Daily, Disp: 100 Tablet, Rfl: 3    mirabegron  (MYRBETRIQ ) 25 mg Oral Tablet Sustained Release 24 hr, Take 1 Tablet (25 mg total) by mouth Daily, Disp: 30 Tablet, Rfl: 3    montelukast  (SINGULAIR ) 10 mg Oral Tablet, Take 1 Tablet (10 mg total) by mouth Every evening, Disp: 30 Tablet, Rfl: 3    nitroGLYCERIN  (NITROSTAT ) 0.4 mg Sublingual Tablet, Sublingual, DISSOLVE 1 TABLET UNDER THE TONGUE AS NEEDED FOR CHEST PAIN EVERY 5 MINUTES UP TO 3 TIMES. IF NO RELIEF CALL 911., Disp: 25 Tablet, Rfl: 10    ondansetron  (ZOFRAN ) 4 mg Oral Tablet, Take 1 Tablet (4 mg total) by mouth Every 8 hours as needed for Nausea/Vomiting, Disp: , Rfl:     pantoprazole  (PROTONIX ) 40 mg Oral Tablet, Delayed Release (E.C.), Take 1 Tablet (40 mg total) by mouth Twice daily, Disp: 180 Tablet, Rfl: 3    predniSONE  (DELTASONE ) 20 mg Oral Tablet, Take 2 Tablets (40 mg total) by mouth Daily, Disp: , Rfl:     rosuvastatin  (CRESTOR ) 40 mg Oral Tablet, Take 1 Tablet (40 mg total) by mouth Every evening, Disp: 100 Tablet, Rfl: 3    tiotropium bromide  (SPIRIVA RESPIMAT) 2.5 mcg/actuation Inhalation oral inhaler, Take 2 Inhalations (2 Puffs total) by inhalation Once a day, Disp: 4 g,  Rfl: 5  [2]   Allergies  Allergen Reactions    Cymbalta  [Duloxetine ]      Rash on feet and hands    Blue Dye      Told to avoid due to patch test result by Derm Melville    Flavoring Agent  Other Adverse Reaction (Add comment)     Patient unsure of reaction    Lipitor [Atorvastatin ] Myalgia     Muscle pain    Lisinopril  Other Adverse Reaction (Add comment)     Cough      Nickel      Blisters  Allergic to all kinds of metal     Codeine  Itching     RASH    Mobic  [Meloxicam ] Nausea/ Vomiting     Nervous and shakes     Naprosyn  [Naproxen ] Nausea/ Vomiting    Nsaids (Non-Steroidal Anti-Inflammatory Drug) Nausea/ Vomiting

## 2024-04-15 ENCOUNTER — Other Ambulatory Visit: Payer: Self-pay

## 2024-04-15 DIAGNOSIS — M170 Bilateral primary osteoarthritis of knee: Secondary | ICD-10-CM

## 2024-04-16 ENCOUNTER — Ambulatory Visit: Payer: Self-pay | Attending: Urology | Admitting: Urology

## 2024-04-16 ENCOUNTER — Encounter (INDEPENDENT_AMBULATORY_CARE_PROVIDER_SITE_OTHER): Payer: Self-pay | Admitting: Urology

## 2024-04-16 DIAGNOSIS — N3281 Overactive bladder: Secondary | ICD-10-CM | POA: Insufficient documentation

## 2024-04-16 LAB — POC URINALYSIS (RESULTS)
BILIRUBIN: NEGATIVE mg/dL
BLOOD: NEGATIVE mg/dL
GLUCOSE: NEGATIVE mg/dL
KETONES: NEGATIVE mg/dL
NITRITE: NEGATIVE
PH: 7 (ref 5.0–8.0)
PROTEIN: 30 mg/dL — AB
SPECIFIC GRAVITY: 1.02 (ref 1.005–1.030)
UROBILINOGEN: 2 mg/dL — AB

## 2024-04-16 MED ORDER — ONABOTULINUMTOXINA 100 UNIT SOLUTION FOR INJECTION
100.0000 [IU] | Freq: Once | INTRAMUSCULAR | Status: AC
Start: 2024-04-16 — End: 2024-04-16
  Administered 2024-04-16: 100 [IU] via INTRAVESICAL

## 2024-04-16 MED ORDER — LIDOCAINE 2 % MUCOSAL JELLY IN APPLICATOR
Status: AC
Start: 2024-04-16 — End: 2024-04-16
  Administered 2024-04-16: 11 mL via TOPICAL

## 2024-04-16 NOTE — Addendum Note (Signed)
 Addended by: Mariah Gerstenberger on: 04/16/2024 01:52 PM     Modules accepted: Orders

## 2024-04-16 NOTE — Nursing Note (Signed)
 Patient arrived for scheduled cystoscopy with Bladder Botox injection for DX: stress urinary incontinence. The patient was able to void prior to procedure for UA. Patient denied UTI symptoms.  The patient was first placed in the dorsolithotomy position. The patient's genitalia was prepped wit Betadine using sterile technique. 1% Lidocaine  gel topical local urojet given into patient's urethra. 16 fr red rubber Rashonda Warrior catheter used to flush local mixture of 50 cc of saline + 10 cc of 2% lidocaine . Catheter removed. Patient placed in supine position and remained for 15 minutes for local to settle in bladder.   The patient placed in the dorsolithotomy position and patient's genitalia was prepped again with Betadine using sterile technique. 2% Lidocaine  gel topical local urojet given into patient's urethra. Dr. Merrie performed Cystoscopy with Bladder Botox injection of Botox 100 units (reconstituted with NaCl into x1 50 ml syringes). Patient tolerated procedure without difficulty.     Patient also advised that after the procedure, the patient may note urinary frequency, urgency, hematuria, or dysuria for a day. Increasing fluid intake helps to flush out the bladder, but caffeinated, carbonated, or alcoholic beverages should be avoided because they may irritate the bladder lining. Signs of infection, such as fever, chills, low back pain, or persistent blood in the urine, should be reported to the clinic or to go to the Emergency Room for evaluation.     Patient dressed and escorted to exam room for Physician to review plan of care with patient.  Patient to RTC as instructed.    Mardy Raddle, RN

## 2024-04-16 NOTE — Progress Notes (Signed)
 [place]  [facility]   DEPARTMENT OF UROLOGY   OPERATION SUMMARY     PATIENT NAME: [name] NUMBER: [id number]   DATE OF SERVICE: 04/16/2024   DATE OF BIRTH: [birth year]     PREOPERATIVE DIAGNOSIS: Refractory urinary urgency, frequency and OAB.    POSTOPERATIVE DIAGNOSIS: Refractory urinary urgency, frequency and OAB.     NAME OF PROCEDURE: 1.  Cystoscopy with injection of Botox into the bladder (CPT [phone]).    ANESTHESIA: 50 cc 1% lidocaine  in bladder, local.    ESTIMATED BLOOD LOSS: Minimal.    FLUIDS: None.    SURGEONS: Taft Hole MD (staff)    INDICATIONS FOR PROCEDURE: The patient is well known to me with a history of refractory urgency, frequency and chronic pelvic pain. They have tried numerous therapies including anticholinergic agents . None of these therapies have helped with their problem. They present to me desiring injection of Botox into the bladder.     I had a long discussion with the patient prior to bringing her back to the operating room. I explained to her that Botox is a new FDA-approved procedure for the treatment of refractory urinary urgency and frequency with chronic pelvic pain. This procedure involves taking the patient to the operating room and doing diagnostic cystoscopy with injection of Botox into the bladder at discrete points that are away from the openings of the ureters into the bladder. The procedure takes approximately 15-20 minutes to perform. After the procedure, patients are taken to the recovery room.     The risks of the procedure include bleeding, infection, injury to the urethra or bladder, urinary tract infection and urinary retention. The urinary retention is a significant risk, estimated to be at approximately 10% per package insert data. Patients must be willing to do intermittent catheterization or have an indwelling Foley catheter if this develops. Usually it is short-term but in some patients it can be longer term. The patient  understands these facts and agrees to the procedure. Further, they understand that there may be a need for repeat injections of Botox at defined periods later in the recovery course. These can be as frequent as every 3 months or as long as every year to 2 years. This is all patient dependent. Further, they understand that this may not help her with her urgency, frequency and chronic pain. I answered all the questions, and we proceeded back to the operating room.     DESCRIPTION OF PROCEDURE (CPT [phone]): The patient was placed on the procedure table.  50 cc of 1% lidocaine  was insilled into the bladder.  After adequate anesthesia was administered, the patient was placed in the lithotomy position. The lower abdomen and genitalia to the mid thigh was prepped and draped in the standard surgical fashion. The 22-French cystoscope with a 30-degree lens was placed through the female urethra and into the bladder. Upon entering the bladder, there was no evidence of bladder tumors, stones or inflammation. Good efflux in both ureteral orifices bilaterally. The Botox injections were then prepared; a total of 100 units of Botox was injected into the bladder. The injections were set up as follows: The 100 units of Botox were placed in 20 mL of solution. The plan will be for 1 mL injections of the Botox material, thus 5.00 units of Botox will be injected at each site for a total of 20 injections, so the total of 100 units of Botox will be injected. Thirty injection sites were selected in  the bladder. The sites were selected above the level of the trigone and 1 cm apart with 1 mL of injection into each injection site. This was undertaken in a preplanned pattern from the right side of the bladder over to the left side and involving the dome of the bladder. After these injections were completed, the bladder was reinspected. There was no evidence of any bleeding.     RTC 1 month.      Taft Hole, MD  Staff Urologist  Department of  Urology

## 2024-04-17 ENCOUNTER — Ambulatory Visit (HOSPITAL_BASED_OUTPATIENT_CLINIC_OR_DEPARTMENT_OTHER): Payer: Self-pay | Admitting: Orthopaedic Surgery

## 2024-04-17 ENCOUNTER — Ambulatory Visit: Payer: Self-pay | Attending: ORTHOPEDIC, SPORTS MEDICINE | Admitting: ORTHOPEDIC, SPORTS MEDICINE

## 2024-04-17 ENCOUNTER — Other Ambulatory Visit: Payer: Self-pay

## 2024-04-17 DIAGNOSIS — M179 Osteoarthritis of knee, unspecified: Secondary | ICD-10-CM | POA: Insufficient documentation

## 2024-04-17 DIAGNOSIS — M25562 Pain in left knee: Secondary | ICD-10-CM | POA: Insufficient documentation

## 2024-04-17 DIAGNOSIS — M170 Bilateral primary osteoarthritis of knee: Secondary | ICD-10-CM

## 2024-04-17 DIAGNOSIS — E66812 Obesity, class 2: Secondary | ICD-10-CM | POA: Insufficient documentation

## 2024-04-17 DIAGNOSIS — M25561 Pain in right knee: Secondary | ICD-10-CM | POA: Insufficient documentation

## 2024-04-17 MED ORDER — LIDOCAINE (PF) 20 MG/ML (2 %) INJECTION SOLUTION
15.0000 mL | Freq: Once | INTRAMUSCULAR | 0 refills | Status: AC
Start: 2024-04-17 — End: 2024-05-10

## 2024-04-17 NOTE — Progress Notes (Signed)
 Name: [name]  MEDICAL RECORD [id number]  DATE OF BIRTH: 1956/10/22  DATE OF SERVICE: 04/17/2024       SUBJECTIVE:  [name] is a 67 y.o. year old who presents to clinic today for evaluation of Knee Pain (Bilateral  )    She is seen today at the request of Dr. Fernande for evaluation of bilateral knee arthritis.  She has known osteoarthritis of the knees.  She has had an injection, so it has been several years since she has had any injections.  She did go to physical therapy most recently for the back and knees and was told to stop the physical therapy due to significant problems in her spine.  She notes that she has significant pain at night.  The left knee is greater than the right knee, but both can be quite sore.  Pain is localized across the anterolateral side of the knee.  She notes cracking and popping.  She notes a catching sensation, especially going uphill or up steps.        PAST MEDICAL HISTORY  Past Medical History:   Diagnosis Date    Anxiety     Arthritis     Awareness under anesthesia     woke during colonoscopy    Cancer (CMS HCC)     internal skin cancer    Chest pain 02/19/2016    Chronic pain     lower back    Depression     Dyspnea on exertion     GERD (gastroesophageal reflux disease)     controlled w/med    H/O hearing loss     b/l, no aids    H/O urinary tract infection     Sept 2024- resolved    Headache     Hearing loss     Heart murmur 1979    benign    History of coronary angioplasty with insertion of stent 05/09/2023    Hyperlipidemia LDL goal < 100 08/29/2013    denies    Migraine     MINOR CAD (coronary artery disease) 11/10/2012    no treatment other than cholesterol medications; follows regularly with K.Dalton, PA    Multinodular goiter     Muscle weakness     neck     Myocardial infarction 05/09/2023    04/18/23, sees [facility]    Neck problem     spinal stenosis    Obesity     Peripheral edema     Rash     eczema    Rheumatic fever     in 1974 no complications    S/P  thyroidectomy     Shortness of breath     DOE    Sleep apnea     Squamous cell carcinoma 02/06/2015    right side of neck    Thyroid  disorder     s/p part thyroidectomy    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    Tinnitus     Vaginal prolapse 2012    surgery improved significantly    Wears glasses       PAST SURGICAL HISTORY    Past Surgical History:   Procedure Laterality Date    Cataract extraction Right 12/07/2022    Chalazion excision Left 12/22/2021    Colonoscopy  01/06/2009    Gastroscopy  03/05/2010    Gastroscopy with biopsy  03/05/2010    Hx adenoidectomy      Hx ankle fracture tx  2007  Hx appendectomy      Hx cholecystectomy      Hx colonoscopy      Hx cystocele repair  09/24/2009    Hx hand surgery  2012    Hx heart catheterization      Hx hysterectomy      Hx oophorectomy      Hx parotidectomy       Hx partial thyroidectomy Right 04/05/2019    Hx tonsillectomy      Hx total vaginal hysterectomy  1979    Hx wisdom teeth extraction      Hx wrist fracture tx Left 2007    Parathyroid  gland surgery      Pars plana vitrectomy Right 11/02/2021    Pb revise ulnar nerve at elbow Left 1979    Pb upper gi endoscopy,biopsy  12/19/2007    Septoplasty       MEDICATIONS  Current Outpatient Medications   Medication Sig    acetaminophen  (TYLENOL ) 500 mg Oral Tablet Take 2 Tablets (1,000 mg total) by mouth Three times a day as needed for Pain    albuterol  sulfate (PROAIR  HFA) 90 mcg/actuation Inhalation oral inhaler Take 2 Puffs by inhalation Every 4 hours as needed    aspirin  81 mg Oral Tablet, Chewable Chew 1 Tablet (81 mg total) Every morning    baclofen  (LIORESAL ) 5 mg Oral Tablet Take 1 Tablet (5 mg total) by mouth Three times a day    BRILINTA 90 mg Oral Tablet Take 1 Tablet (90 mg total) by mouth Twice daily    clobetasoL  (TEMOVATE ) 0.05 % Ointment Apply topically Twice daily    desvenlafaxine  (PRISTIQ ) 100 mg Oral Tablet Sustained Release 24 hr Take 1 Tablet (100 mg total) by mouth Daily    diclofenac   sodium (VOLTAREN ) 1 % Gel Apply topically Four times a day - before meals and bedtime    famotidine  (PEPCID ) 40 mg Oral Tablet Take 1 Tablet (40 mg total) by mouth Twice daily    gabapentin  (NEURONTIN ) 800 mg Oral Tablet Take 1 Tablet (800 mg total) by mouth Twice daily    ipratropium bromide  (ATROVENT ) 42 mcg (0.06 %) Nasal Spray, Non-Aerosol Administer 2 Sprays into affected nostril(s) Three times a day for 90 days    isosorbide  mononitrate (IMDUR ) 30 mg Oral Tablet Sustained Release 24 hr Take 1 Tablet (30 mg total) by mouth Every morning    lidocaine  20 mg/mL (2 %) Injection Solution Inject 15 mL (300 mg total) into the skin One time for 1 dose Indications: administration of local anesthetic drug, intravesicular reconstitute w/ 35mL NSS for total volume 50mL for pre-intravesicular Botox    metoprolol  succinate (TOPROL -XL) 25 mg Oral Tablet Sustained Release 24 hr Take 1 Tablet (25 mg total) by mouth Daily    mirabegron  (MYRBETRIQ ) 25 mg Oral Tablet Sustained Release 24 hr Take 1 Tablet (25 mg total) by mouth Daily    montelukast  (SINGULAIR ) 10 mg Oral Tablet Take 1 Tablet (10 mg total) by mouth Every evening    nitroGLYCERIN  (NITROSTAT ) 0.4 mg Sublingual Tablet, Sublingual DISSOLVE 1 TABLET UNDER THE TONGUE AS NEEDED FOR CHEST PAIN EVERY 5 MINUTES UP TO 3 TIMES. IF NO RELIEF CALL 911.    ondansetron  (ZOFRAN ) 4 mg Oral Tablet Take 1 Tablet (4 mg total) by mouth Every 8 hours as needed for Nausea/Vomiting    pantoprazole  (PROTONIX ) 40 mg Oral Tablet, Delayed Release (E.C.) Take 1 Tablet (40 mg total) by mouth Twice daily    predniSONE  (DELTASONE ) 20 mg Oral  Tablet Take 2 Tablets (40 mg total) by mouth Daily    rosuvastatin  (CRESTOR ) 40 mg Oral Tablet Take 1 Tablet (40 mg total) by mouth Every evening    tiotropium bromide  (SPIRIVA RESPIMAT) 2.5 mcg/actuation Inhalation oral inhaler Take 2 Inhalations (2 Puffs total) by inhalation Once a day     ALLERGIES  Allergies[1]    PHYSICAL EXAM  Temp 36.3 C (97.3 F)  (Thermal Scan)   Ht 1.651 m (5' 5)   Wt 99.7 kg (219 lb 14.5 oz)   BMI 36.59 kg/m        In no acute distress with appropriate mood and affect.     CV: Pulses palpable and equal distally.   DERM: Skin is warm and dry to touch.  Bilateral knees:  There is just a trace effusion.  She has pain with terminal extension.  She has flexion just about 90 degrees.  Passively I am able to exceed this but minimally.  There is no laxity with valgus and varus stress, however, she has pain with both motions.  She is exquisitely tender over the patella, the medial and lateral joint line as well as the pes bursa.  She has good range of motion of the hip without provocation of knee pain.          RADIOGRAPHY:   Bilateral knee films dated April 12, 2024, are reviewed.  Per my read there is moderate degenerative arthrosis with narrowing of the medial greater than lateral joint space.        ASSESSMENT:     ICD-10-CM    1. Osteoarthritis, knee  M17.9       2. Bilateral knee pain  M25.561     M25.562       3. Class 2 obesity  E66.812         PLAN:  At today's visit, we discussed treatment options.   She has moderate tricompartmental degenerative arthritis of both knees.  We briefly discussed treatment options.  She would like to proceed with injection.  We proceeded with bilateral knee injection today.  I have given her an order for physical therapy.  We have also instructed her on a home exercise program as she will not be able to start physical therapy in the immediate future.        If the patient has any questions or concerns prior to next visit, she can contact our office.        Morene Houseman, MD 04/17/2024, 09:48         [1]   Allergies  Allergen Reactions    Cymbalta  [Duloxetine ]      Rash on feet and hands    Blue Dye      Told to avoid due to patch test result by [facility]    Flavoring Agent  Other Adverse Reaction (Add comment)     Patient unsure of reaction    Lipitor [Atorvastatin ] Myalgia     Muscle pain     Lisinopril  Other Adverse Reaction (Add comment)     Cough      Nickel      Blisters  Allergic to all kinds of metal     Codeine  Itching     RASH    Mobic  [Meloxicam ] Nausea/ Vomiting     Nervous and shakes     Naprosyn  [Naproxen ] Nausea/ Vomiting    Nsaids (Non-Steroidal Anti-Inflammatory Drug) Nausea/ Vomiting

## 2024-04-17 NOTE — Addendum Note (Signed)
 Addended by: Karsten Howry on: 04/17/2024 08:05 AM     Modules accepted: Orders

## 2024-04-17 NOTE — Procedures (Signed)
 SPORTS MEDICINE, [facility] [address]  [place] [address]  Operated by [facility]  Procedure Note    Name: [name] MRN:  [id number]   Date: 04/17/2024 DOB:  1956-02-24 (67 y.o.)         Large Joint Arthrocentesis: bilateral knee    The risks and benefits of the injection were explained to the patient and the decision was made to proceed with the injection.  The skin was prepped with betadine and the injection was carried out using 2 cc of 1% lidocaine , 2 cc of 0.5% marcaine , and Depomedrol  80mg .  This was injected into each knee.  The skin was cleaned with alcohol and a sterile bandage was placed over the injection site. The patient tolerated the injection well.    Consent was given by the patient.           Morene Houseman, MD

## 2024-04-17 NOTE — Progress Notes (Signed)
 [name]  [id number]  04/17/2024    Involved area: bilateral knees    Therapeutic Exercise:  Per physician direction, patient was provided with a home exercise program for knee OA.    Exercises were explained/demonstrated to patient.  Patient verified that she understood the home exercise program, including sets/reps/frequency and goals.  Written and pictorial instructions were provided.      Questions were encouraged and all questions were answered.      Goals:  -Increased ROM and strength  -Increase ease in ADL  -Decrease discomfort of involved area    Frederic Lied, ATC, LAT  Certified Event organiser

## 2024-04-18 ENCOUNTER — Ambulatory Visit: Payer: Self-pay | Admitting: Student in an Organized Health Care Education/Training Program

## 2024-04-30 ENCOUNTER — Other Ambulatory Visit: Payer: Self-pay

## 2024-05-01 ENCOUNTER — Ambulatory Visit (INDEPENDENT_AMBULATORY_CARE_PROVIDER_SITE_OTHER): Admitting: Chiropractor

## 2024-05-01 DIAGNOSIS — M9904 Segmental and somatic dysfunction of sacral region: Secondary | ICD-10-CM

## 2024-05-01 DIAGNOSIS — M51369 Other intervertebral disc degeneration, lumbar region without mention of lumbar back pain or lower extremity pain: Secondary | ICD-10-CM

## 2024-05-01 DIAGNOSIS — M5136 Other intervertebral disc degeneration, lumbar region with discogenic back pain only: Secondary | ICD-10-CM

## 2024-05-01 DIAGNOSIS — M791 Myalgia, unspecified site: Secondary | ICD-10-CM

## 2024-05-01 DIAGNOSIS — M9902 Segmental and somatic dysfunction of thoracic region: Secondary | ICD-10-CM

## 2024-05-01 DIAGNOSIS — M9903 Segmental and somatic dysfunction of lumbar region: Secondary | ICD-10-CM

## 2024-05-01 DIAGNOSIS — M47812 Spondylosis without myelopathy or radiculopathy, cervical region: Secondary | ICD-10-CM

## 2024-05-01 DIAGNOSIS — M9901 Segmental and somatic dysfunction of cervical region: Secondary | ICD-10-CM

## 2024-05-01 NOTE — Progress Notes (Signed)
 [facility]  [address]  [place] [address]  [phone]        Name: Kynlea Blackston  MRN: [id number]  Date:05/01/2024  Birthday: Apr 09, 1956      Chief Complaint   Patient presents with    Neck Pain    Back Pain    Myalgia         Subjective:  [name] presents for follow up care. She states that she aggravated her neck/back pain due to moving at the end of June. She rates her back pain an 8/10 this morning. She denies having any pain, numbness/tingling, or weakness down her arms or legs. She also denies having any spinal fractures, spinal surgeries, or bowel/bladder incontinence.    Objective:       Gautier Pain Rating Scale     On a scale of 0-10, during the past 24 hours, pain has interfered with you usual activity:       On a scale of 0-10, during the past 24 hours, pain has interfered with your sleep:      On a scale of 0-10, during the past 24 hours, pain has affected your mood:       On a scale of 0-10, during the past 24 hours, pain has contributed to your stress:       On a scale of 0-10, what is your overall pain Rating: 8    Cervical ROM: Decreased.  UE DTR 2+ equal.  UE motor 5/5 bilaterally.  UE sensory: Grossly intact.  Lumbar ROM: Decreased.  LE DTR 2+ equal.  LE motor 5/5 bilaterally.  LE sensory: Grossly intact.  Joint mobility restrictions: C1-C2, T5-T6, L5-S1, right upper sacroliliac  Tender points/Trigger points: Full spine.  Hypertonicity: Cervical, thoracic, and lumbar paraspinals.      Assessment:       ICD-10-CM    1. Segmental dysfunction of lumbar region  M99.03       2. 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 Segmental dysfunction of sacral region  M99.04       4. Segmental dysfunction of thoracic region  M99.02       5. Segmental dysfunction of cervical region  M99.01       6. 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 Myalgia  M79.10           Plan:  2x/ week for 4 weeks.    Today's  treatment:    Chiropractic manipulation of T5, L5 and the right SI with drop piece to improve joint function and mobility.   Pt tolerated today's visit well.    CPT Codes: 00786 and [id number].    D[place] JINNY Oak, DC

## 2024-05-08 ENCOUNTER — Other Ambulatory Visit: Payer: Self-pay

## 2024-05-08 ENCOUNTER — Ambulatory Visit (INDEPENDENT_AMBULATORY_CARE_PROVIDER_SITE_OTHER): Admitting: Chiropractor

## 2024-05-08 DIAGNOSIS — M9904 Segmental and somatic dysfunction of sacral region: Secondary | ICD-10-CM

## 2024-05-08 DIAGNOSIS — M9901 Segmental and somatic dysfunction of cervical region: Secondary | ICD-10-CM

## 2024-05-08 DIAGNOSIS — M791 Myalgia, unspecified site: Secondary | ICD-10-CM

## 2024-05-08 DIAGNOSIS — M9902 Segmental and somatic dysfunction of thoracic region: Secondary | ICD-10-CM

## 2024-05-08 DIAGNOSIS — M47812 Spondylosis without myelopathy or radiculopathy, cervical region: Secondary | ICD-10-CM

## 2024-05-08 DIAGNOSIS — M9903 Segmental and somatic dysfunction of lumbar region: Secondary | ICD-10-CM

## 2024-05-08 DIAGNOSIS — M51369 Other intervertebral disc degeneration, lumbar region without mention of lumbar back pain or lower extremity pain: Secondary | ICD-10-CM

## 2024-05-08 NOTE — Progress Notes (Signed)
 [facility]  [address]  [place] [address]  [phone]        Name: [name]  MRN: [id number]  Date:05/08/2024  Birthday: 04-25-1956      Chief Complaint   Patient presents with    Back Pain    Neck Pain    Myalgia         Subjective:  [name] presents for follow up care. She states that her neck/back are still pretty tight and sore from moving and unpacking. She rates her back pain a 7/10 this afternoon. She denies having any pain, numbness/tingling, or weakness down her arms or legs. She also denies having any spinal fractures, spinal surgeries, or bowel/bladder incontinence.    Objective:       Pilger Pain Rating Scale     On a scale of 0-10, during the past 24 hours, pain has interfered with you usual activity:       On a scale of 0-10, during the past 24 hours, pain has interfered with your sleep:      On a scale of 0-10, during the past 24 hours, pain has affected your mood:       On a scale of 0-10, during the past 24 hours, pain has contributed to your stress:       On a scale of 0-10, what is your overall pain Rating: 7    Cervical ROM: Decreased.  UE DTR 2+ equal.  UE motor 5/5 bilaterally.  UE sensory: Grossly intact.  Lumbar ROM: Decreased.  LE DTR 2+ equal.  LE motor 5/5 bilaterally.  LE sensory: Grossly intact.  Joint mobility restrictions: C1-C2, T5-T6, L5-S1, right upper sacroliliac  Tender points/Trigger points: Full spine.  Hypertonicity: Cervical, thoracic, and lumbar paraspinals.      Assessment:       ICD-10-CM    1. Segmental dysfunction of lumbar region  M99.03       2. 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 Segmental dysfunction of sacral region  M99.04       4. Segmental dysfunction of thoracic region  M99.02       5. Segmental dysfunction of cervical region  M99.01       6. 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 Myalgia  M79.10           Plan:  2x/ week for 4  weeks.    Today's treatment:    Chiropractic manipulation of T5, L5 and the right SI with drop piece to improve joint function and mobility.   Pt tolerated today's visit well.    CPT Codes: 01058.    D[place] JINNY Oak, DC

## 2024-05-09 ENCOUNTER — Other Ambulatory Visit: Payer: Self-pay

## 2024-05-10 ENCOUNTER — Encounter (HOSPITAL_BASED_OUTPATIENT_CLINIC_OR_DEPARTMENT_OTHER): Payer: Self-pay

## 2024-05-10 ENCOUNTER — Ambulatory Visit (HOSPITAL_BASED_OUTPATIENT_CLINIC_OR_DEPARTMENT_OTHER): Payer: Self-pay | Admitting: Physician Assistant

## 2024-05-10 ENCOUNTER — Ambulatory Visit: Attending: Family Medicine

## 2024-05-10 ENCOUNTER — Ambulatory Visit (INDEPENDENT_AMBULATORY_CARE_PROVIDER_SITE_OTHER): Payer: Self-pay | Admitting: Urology

## 2024-05-10 VITALS — BP 130/82 | HR 106 | Temp 97.1°F | Ht 65.0 in | Wt 214.7 lb

## 2024-05-10 DIAGNOSIS — R109 Unspecified abdominal pain: Secondary | ICD-10-CM | POA: Insufficient documentation

## 2024-05-10 MED ORDER — CEFPODOXIME 200 MG TABLET
200.0000 mg | ORAL_TABLET | Freq: Two times a day (BID) | ORAL | 0 refills | Status: DC
Start: 2024-05-10 — End: 2024-06-26

## 2024-05-10 NOTE — Nursing Note (Signed)
 New Appointment  Received: Today  [name]  P [facility] [place] Rn         Message from [place] sent at 05/10/2024 12:41 PM EDT    Summary: New Appointment    Copied From CRM [phone].  [place] pt.    Pt was seen at [facility] today where they completed an US  of bladder and kidney. Which showed swollen kidneys as a result of urine backing up into them. Pt was also given cefpodoxime  (VANTIN ) 200 mg Oral Tablet.  Pt was advised that she should be seen sooner than her appt on Monday with Z. Please advise.      [phone] pt contact                Call History    Contact Date/Time Type Contact Identity Validated Phone/Fax By   05/10/2024 12:36 PM EDT Phone (Incoming) [name] (Self) Name, DOB, Phone, Address [phone] BENNIE) Barbara Castilla         RN called call center for [facility] to clarify if pt needs seen tomorrow.    Lorenzo Arscott, CLINICAL CARE COORDINATOR

## 2024-05-10 NOTE — Nursing Note (Signed)
 05/10/24 0900   Required: Location Test Performed At:   [facility], [address], [place], [place] [address]   Urine Test   Performed Status: Manual   Time collected 0948   Manufacturer Siemens   Urine Test - Siemens   Color (Ref Range: Yellow) (!) Dark Yellow   Clarity (Ref Range: Clear) Clear   Glucose (Ref Range: Negative mg/dL) Negative   Bilirubin (Ref Range: Negative mg/dL) Negative   Ketones (Ref Range: Negative mg/dL) Negative   Urine Specific Gravity (Ref Range: 1.005 - 1.030) 1.010   Blood (urine) (Ref Range: Negative mg/dL) (!) Trace   pH (Ref Range: 5.0 - 8.0) 8.0   Protein (Ref Range: Negative mg/dL) (!) Trace   Urobilinogen (Ref Range: Normal) Normal    Nitrite (Ref Range: Negative) Negative   Leukocytes (Ref Range: Negative WBC's/uL) Trace   Bottle Number (Siemens Multistix 10 SG) 47   Lot # [id number]   Expiration Date 05/24/24   Initials BG     Laymon Mean, Ambulatory Care Assistant

## 2024-05-10 NOTE — Telephone Encounter (Signed)
 Regarding: Kristi Dalton, PA, saw Dr Bernardino Booze, appointment question  ----- Message from Rocky HERO sent at 05/10/2024  1:20 PM EDT -----  Copied From CRM [id number].Beth from [facility] (Provider) called - the patient called them stating she needs seen now and had imaging.  She is saying she has Hydro and urine backing up in her kidneys.  They are asking if she needs seen today or if it can wait until Monday.    Thank you,  Rocky Parsley

## 2024-05-10 NOTE — Progress Notes (Signed)
 [facility], [place] [address]  [address]  [place] [address]  Operated by [facility]  Return Patient Visit    Name: [name] MRN:  [id number]   Date: 05/10/2024 DOB:  July 19, 1956 (67 y.o.)   PCP: Rayfield Barrack, PA-C    Chief Complaint  Chief Complaint   Patient presents with    Flank Pain    Urinary Retention     Subjective   [name] is a 68 y.o. female who presents to clinic for evaluation of bilateral flank pain.     Patient had cystoscopy with injection of botox  into the bladder on 6/23. She was urinating well until yesterday when she noticed she started dribbling and had to strain to empty her bladder. She also developed severe back pain. She describes it as stabbing pain that comes and goes every 5-10 minutes and rates it as a 9/10. She has not taken anything for the pain. Denies any burning with urination but notes some soreness around her urethra attributed to straining. Denies fever or chills.       Medication List  acetaminophen  (TYLENOL ) 500 mg Oral Tablet, Take 2 Tablets (1,000 mg total) by mouth Three times a day as needed for Pain  albuterol  sulfate (PROAIR  HFA) 90 mcg/actuation Inhalation oral inhaler, Take 2 Puffs by inhalation Every 4 hours as needed  aspirin  81 mg Oral Tablet, Chewable, Chew 1 Tablet (81 mg total) Every morning  baclofen  (LIORESAL ) 5 mg Oral Tablet, Take 1 Tablet (5 mg total) by mouth Three times a day  BRILINTA 90 mg Oral Tablet, Take 1 Tablet (90 mg total) by mouth Twice daily  clobetasoL  (TEMOVATE ) 0.05 % Ointment, Apply topically Twice daily  desvenlafaxine  (PRISTIQ ) 100 mg Oral Tablet Sustained Release 24 hr, Take 1 Tablet (100 mg total) by mouth Daily  diclofenac  sodium (VOLTAREN ) 1 % Gel, Apply topically Four times a day - before meals and bedtime  famotidine  (PEPCID ) 40 mg Oral Tablet, Take 1 Tablet (40 mg total) by mouth Twice daily  gabapentin  (NEURONTIN ) 800 mg Oral Tablet, Take 1 Tablet (800 mg total) by  mouth Twice daily  ipratropium bromide  (ATROVENT ) 42 mcg (0.06 %) Nasal Spray, Non-Aerosol, Administer 2 Sprays into affected nostril(s) Three times a day for 90 days  isosorbide  mononitrate (IMDUR ) 30 mg Oral Tablet Sustained Release 24 hr, Take 1 Tablet (30 mg total) by mouth Every morning  lidocaine  20 mg/mL (2 %) Injection Solution, Inject 15 mL (300 mg total) into the skin One time for 1 dose Indications: administration of local anesthetic drug, intravesicular reconstitute w/ 35mL NSS for total volume 50mL for pre-intravesicular Botox  (Patient not taking: Reported on 05/11/2024)  metoprolol  succinate (TOPROL -XL) 25 mg Oral Tablet Sustained Release 24 hr, Take 1 Tablet (25 mg total) by mouth Daily  mirabegron  (MYRBETRIQ ) 25 mg Oral Tablet Sustained Release 24 hr, Take 1 Tablet (25 mg total) by mouth Daily  montelukast  (SINGULAIR ) 10 mg Oral Tablet, Take 1 Tablet (10 mg total) by mouth Every evening  nitroGLYCERIN  (NITROSTAT ) 0.4 mg Sublingual Tablet, Sublingual, DISSOLVE 1 TABLET UNDER THE TONGUE AS NEEDED FOR CHEST PAIN EVERY 5 MINUTES UP TO 3 TIMES. IF NO RELIEF CALL 911.  ondansetron  (ZOFRAN ) 4 mg Oral Tablet, Take 1 Tablet (4 mg total) by mouth Every 8 hours as needed for Nausea/Vomiting  pantoprazole  (PROTONIX ) 40 mg Oral Tablet, Delayed Release (E.C.), Take 1 Tablet (40 mg total) by mouth Twice daily  rosuvastatin  (CRESTOR ) 40 mg Oral Tablet,  Take 1 Tablet (40 mg total) by mouth Every evening  tiotropium bromide  (SPIRIVA RESPIMAT) 2.5 mcg/actuation Inhalation oral inhaler, Take 2 Inhalations (2 Puffs total) by inhalation Once a day  predniSONE  (DELTASONE ) 20 mg Oral Tablet, Take 2 Tablets (40 mg total) by mouth Daily    No facility-administered medications prior to visit.    Objective   BP 130/82   Pulse (!) 106   Temp 36.2 C (97.1 F) (Thermal Scan)   Ht 1.651 m (5' 5)   Wt 97.4 kg (214 lb 11.7 oz)   SpO2 97%   BMI 35.73 kg/m     General: Alert, In no acute distress  Eyes: Pupils equal, round,  reactive to light  HENT: Normocephalic, Atraumatic, Ear exam unremarkable, Nasal exam normal, Oral exam normal  Pulmonary: Normal respiratory effort, Clear to auscultation bilaterally  Cardiovascular: Well Perfused, RRR, No murmur appreciated  Abdominal/Gastrointestinal: Soft, Non-distended, Non-tender, Bowel sounds normoactive  Extremities: No cyanosis, No edema bilaterally  Integumentary: Warm and dry, No rash  Neurologic: Alert and oriented, CN II-XII grossly in tact, Normal speech  Psychiatric: Normal affect and behavior   Urinary: Significant TTP of b/l flanks.     Assessment & Plan  Flank pain  Acute, unstable.   Take already prescribed Baclofen  5 mg prn and tylenol  for pain.   UA showed trace blood and protein but otherwise WNL. Patient high risk for pyelonephritis so vantin  200 mg BID for 7 days ordered.   Advised to go to the ED for further evaluation / imaging but patient declined.   Follow up with urology, will call to move appointment up   Counseled that if symptoms worsen, need immediate evaluation at the ED.   Orders:    POCT Urine dipstick          No follow-ups on file.    [name], MED STUDENT      I have seen and examined the patient in concert with the medical student as noted above.  The above note has been modified as necessary by me to reflect my own, personal collection/validation of the patient's HPI, patient history, physical examination, and assessment and plan.  Bernardino Booze, MD    I saw and examined the patient.  I reviewed the resident's note.  I agree with the findings and plan of care as documented in the resident's note.  Any exceptions/additions are edited/noted.    Livingston Dennis, MD

## 2024-05-11 ENCOUNTER — Emergency Department (HOSPITAL_COMMUNITY)

## 2024-05-11 ENCOUNTER — Encounter (INDEPENDENT_AMBULATORY_CARE_PROVIDER_SITE_OTHER): Payer: Self-pay | Admitting: Specialist

## 2024-05-11 ENCOUNTER — Other Ambulatory Visit: Payer: Self-pay

## 2024-05-11 ENCOUNTER — Ambulatory Visit (HOSPITAL_BASED_OUTPATIENT_CLINIC_OR_DEPARTMENT_OTHER): Attending: Specialist | Admitting: Specialist

## 2024-05-11 ENCOUNTER — Emergency Department
Admission: AD | Admit: 2024-05-11 | Discharge: 2024-05-11 | Disposition: A | Discharge status: Home / Self Care | Source: Ambulatory Visit | Attending: Emergency Medicine | Admitting: Emergency Medicine

## 2024-05-11 ENCOUNTER — Encounter (HOSPITAL_COMMUNITY): Payer: Self-pay

## 2024-05-11 VITALS — BP 120/68 | HR 94 | Temp 97.0°F | Ht 65.0 in | Wt 213.6 lb

## 2024-05-11 DIAGNOSIS — R350 Frequency of micturition: Secondary | ICD-10-CM

## 2024-05-11 DIAGNOSIS — N1330 Unspecified hydronephrosis: Secondary | ICD-10-CM

## 2024-05-11 DIAGNOSIS — R109 Unspecified abdominal pain: Secondary | ICD-10-CM | POA: Insufficient documentation

## 2024-05-11 DIAGNOSIS — R911 Solitary pulmonary nodule: Secondary | ICD-10-CM | POA: Insufficient documentation

## 2024-05-11 DIAGNOSIS — N3281 Overactive bladder: Secondary | ICD-10-CM | POA: Insufficient documentation

## 2024-05-11 DIAGNOSIS — R3916 Straining to void: Secondary | ICD-10-CM

## 2024-05-11 DIAGNOSIS — N390 Urinary tract infection, site not specified: Secondary | ICD-10-CM

## 2024-05-11 LAB — CBC WITH DIFF
BASOPHIL #: <0.1 x10ˆ3/uL (ref <=0.20)
BASOPHIL %: 0.4 %
EOSINOPHIL #: <0.1 x10ˆ3/uL (ref <=0.50)
EOSINOPHIL %: 0.1 %
HCT: 42.5 % (ref 34.8–46.0)
HGB: 13.8 g/dL (ref 11.5–16.0)
IMMATURE GRANULOCYTE #: <0.1 x10ˆ3/uL (ref <0.10)
IMMATURE GRANULOCYTE %: 0.3 % (ref 0.0–1.0)
LYMPHOCYTE #: 2.26 x10ˆ3/uL (ref 1.00–4.80)
LYMPHOCYTE %: 29 %
MCH: 28.9 pg (ref 26.0–32.0)
MCHC: 32.5 g/dL (ref 31.0–35.5)
MCV: 88.9 fL (ref 78.0–100.0)
MONOCYTE #: 0.74 x10ˆ3/uL (ref 0.20–1.10)
MONOCYTE %: 9.5 %
MPV: 8.9 fL (ref 8.7–12.5)
NEUTROPHIL #: 4.73 x10ˆ3/uL (ref 1.50–7.70)
NEUTROPHIL %: 60.7 %
PLATELETS: 349 x10ˆ3/uL (ref 150–400)
RBC: 4.78 x10ˆ6/uL (ref 3.85–5.22)
RDW-CV: 15.4 % (ref 11.5–15.5)
WBC: 7.8 x10ˆ3/uL (ref 3.7–11.0)

## 2024-05-11 LAB — BASIC METABOLIC PANEL
ANION GAP: 5 mmol/L (ref 4–13)
BUN/CREA RATIO: 13 (ref 6–22)
BUN: 12 mg/dL (ref 8–25)
CALCIUM: 9.3 mg/dL (ref 8.6–10.3)
CHLORIDE: 120 mmol/L — ABNORMAL HIGH (ref 96–111)
CO2 TOTAL: 20 mmol/L — ABNORMAL LOW (ref 23–31)
CREATININE: 0.96 mg/dL (ref 0.60–1.05)
ESTIMATED GFR - FEMALE: 65 mL/min/BSA (ref >=60)
GLUCOSE: 100 mg/dL (ref 65–125)
POTASSIUM: 4.7 mmol/L (ref 3.5–5.1)
SODIUM: 145 mmol/L (ref 136–145)

## 2024-05-11 LAB — URINALYSIS, MACROSCOPIC
BILIRUBIN: NEGATIVE mg/dL
BLOOD: NEGATIVE mg/dL
COLOR: NORMAL
GLUCOSE: NEGATIVE mg/dL
KETONES: NEGATIVE mg/dL
NITRITE: NEGATIVE
PH: 6 (ref 5.0–8.0)
PROTEIN: NEGATIVE mg/dL
SPECIFIC GRAVITY: >1.04 — ABNORMAL HIGH (ref 1.005–1.030)
UROBILINOGEN: NEGATIVE mg/dL

## 2024-05-11 LAB — URINALYSIS, MICROSCOPIC
RBCS: 6 /HPF — ABNORMAL HIGH (ref <6.0)
WBCS: 18 /HPF — ABNORMAL HIGH (ref <11.0)

## 2024-05-11 LAB — POC URINALYSIS (RESULTS)
BILIRUBIN: NEGATIVE mg/dL
BLOOD: NEGATIVE mg/dL
GLUCOSE: NEGATIVE mg/dL
KETONES: NEGATIVE mg/dL
NITRITE: NEGATIVE
PH: 7.5 (ref 5.0–8.0)
PROTEIN: 30 mg/dL — AB
SPECIFIC GRAVITY: 1.02 (ref 1.005–1.030)
UROBILINOGEN: 1 mg/dL

## 2024-05-11 MED ORDER — FENTANYL (PF) 50 MCG/ML INJECTION SOLUTION
50.0000 ug | INTRAMUSCULAR | Status: AC
Start: 2024-05-11 — End: 2024-05-11
  Administered 2024-05-11: 50 ug via INTRAVENOUS
  Filled 2024-05-11: qty 2

## 2024-05-11 MED ORDER — IOPAMIDOL 370 MG IODINE/ML (76 %) INTRAVENOUS SOLUTION
100.0000 mL | INTRAVENOUS | Status: AC
Start: 2024-05-11 — End: 2024-05-11
  Administered 2024-05-11: 100 mL via INTRAVENOUS

## 2024-05-11 MED ORDER — SODIUM CHLORIDE 0.9 % IV BOLUS
1000.0000 mL | INJECTION | Status: AC
Start: 2024-05-11 — End: 2024-05-11
  Administered 2024-05-11: 0 mL via INTRAVENOUS
  Administered 2024-05-11: 1000 mL via INTRAVENOUS

## 2024-05-11 MED ORDER — KETOROLAC 15 MG/ML INJECTION SOLUTION
15.0000 mg | Freq: Four times a day (QID) | INTRAMUSCULAR | Status: DC | PRN
Start: 2024-05-11 — End: 2024-05-11
  Administered 2024-05-11: 15 mg via INTRAVENOUS
  Filled 2024-05-11: qty 1

## 2024-05-11 MED ORDER — ACETAMINOPHEN 325 MG TABLET
975.0000 mg | ORAL_TABLET | ORAL | Status: DC
Start: 2024-05-11 — End: 2024-05-11
  Administered 2024-05-11: 0 mg via ORAL
  Filled 2024-05-11: qty 3

## 2024-05-11 NOTE — Progress Notes (Signed)
 NAME: [name]  AGE: 68 y.o.  DATE: 05/11/2024  SERVICE: Urology        Chief Complaint   Patient presents with    Difficulty Urinating           S: This is a 68 y.o.  female here for follow up of bilateral flank pain.  She has a history of refractory frequency urgency and frequency.  She is status post Botox  injection in the clinic on 04/16/2024.  The patient complains of recent significant straining to void.  She also developed significant bilateral flank pain about 2 days ago.  Her pain is now 9 on a 10 scale.  She denies any nausea or vomiting.  She had a little gross hematuria initially but this has resolved.  She was seen in family Medicine yesterday where a renal ultrasound reportedly found hydronephrosis with dilated ureters.  She is here today for follow-up.    Past Medical History  Past Medical History:   Diagnosis Date    Anxiety     Arthritis     Awareness under anesthesia     woke during colonoscopy    Cancer (CMS HCC)     internal skin cancer    Chest pain 02/19/2016    Chronic pain     lower back    Depression     Dyspnea on exertion     GERD (gastroesophageal reflux disease)     controlled w/med    H/O hearing loss     b/l, no aids    H/O urinary tract infection     Sept 2024- resolved    Headache     Hearing loss     Heart murmur 1979    benign    History of coronary angioplasty with insertion of stent 05/09/2023    Hyperlipidemia LDL goal < 100 08/29/2013    denies    Migraine     MINOR CAD (coronary artery disease) 11/10/2012    no treatment other than cholesterol medications; follows regularly with K.Dalton, PA    Multinodular goiter     Muscle weakness     neck     Myocardial infarction 05/09/2023    04/18/23, sees [place] cards    Neck problem     spinal stenosis    Obesity     Peripheral edema     Rash     eczema    Rheumatic fever     in 1974 no complications    S/P thyroidectomy     Shortness of breath     DOE    Sleep apnea     Squamous cell carcinoma 02/06/2015    right side of  neck    Thyroid  disorder     s/p part thyroidectomy    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    Tinnitus     Vaginal prolapse 2012    surgery improved significantly    Wears glasses            Past Surgical History  Past Surgical History:   Procedure Laterality Date    CATARACT EXTRACTION Right 12/07/2022    CHALAZION EXCISION Left 12/22/2021    and conj lesion excision from caruncle    COLONOSCOPY  01/06/2009    COLONOSCOPY performed by KARIN, AHMED F at [facility] OR ENDO    GASTROSCOPY  03/05/2010    GASTROSCOPY performed by WAVERLY, SWATI at [facility] OR ENDO    GASTROSCOPY WITH BIOPSY  03/05/2010  GASTROSCOPY WITH BIOPSY performed by WAVERLY PLEAS at [facility] OR ENDO    HX ADENOIDECTOMY      HX ANKLE FRACTURE TX  2007    left distal fibula, casted    HX APPENDECTOMY      HX CHOLECYSTECTOMY      HX COLONOSCOPY      HX CYSTOCELE REPAIR  09/24/2009    HX HAND SURGERY  2012    for Carpal tunnel : right side treated    HX HEART CATHETERIZATION      HX HYSTERECTOMY      HX OOPHORECTOMY      left ovary removed    HX PAROTIDECTOMY       surgical clamp     HX PARTIAL THYROIDECTOMY Right 04/05/2019    R hemithyroidectomy for nodule, final path follicular adenoma    HX TONSILLECTOMY      HX TOTAL VAGINAL HYSTERECTOMY  1979    HX WISDOM TEETH EXTRACTION      HX WRIST FRACTURE TX Left 2007    FOOSH injury    PARATHYROID  GLAND SURGERY      PARS PLANA VITRECTOMY Right 11/02/2021    Pars plana vitrectomy, nternal limiting membrane (ILM) and epiretinal membrane (ERM) peel, Injection of 20%  sf6 gas, Subtenon's injection of Cefuroxime  50 mg and Dexamethasone  2 mg    PB REVISE ULNAR NERVE AT ELBOW Left 1979    PB UPPER GI ENDOSCOPY,BIOPSY  12/19/2007    patulous GE junction zone, erythema, nonerosive GERD    SEPTOPLASTY             Allergies[1]    Current Outpatient Medications   Medication Instructions    acetaminophen  (TYLENOL ) 1,000 mg, Oral, 3 TIMES DAILY PRN    albuterol  sulfate (PROAIR  HFA) 90 mcg/actuation Inhalation oral inhaler 2  Puffs, Inhalation, EVERY 4 HOURS PRN    aspirin  81 mg, EVERY MORNING    baclofen  (LIORESAL ) 5 mg, Oral, 3 TIMES DAILY    Brilinta 90 mg, 2 TIMES DAILY    cefpodoxime  (VANTIN ) 200 mg, Oral, 2 TIMES DAILY    clobetasoL  (TEMOVATE ) 0.05 % Ointment Apply Topically, 2 TIMES DAILY    desvenlafaxine  (PRISTIQ ) 100 mg, Oral, Daily    diclofenac  sodium (VOLTAREN ) 1 % Gel Apply Topically, 4 TIMES DAILY - BEFORE MEALS AND NIGHTLY    famotidine  (PEPCID ) 40 mg, Oral, 2 TIMES DAILY    gabapentin  (NEURONTIN ) 800 mg, Oral, 2 TIMES DAILY    ipratropium bromide  (ATROVENT ) 42 mcg (0.06 %) Nasal Spray, Non-Aerosol 2 Sprays, Nasal, 3 TIMES DAILY    isosorbide  mononitrate (IMDUR ) 30 mg, Oral, EVERY MORNING    metoprolol  succinate (TOPROL -XL) 25 mg, Oral, Daily    mirabegron  (MYRBETRIQ ) 25 mg, Oral, Daily    montelukast  (SINGULAIR ) 10 mg, Oral, EVERY EVENING    nitroGLYCERIN  (NITROSTAT ) 0.4 mg Sublingual Tablet, Sublingual DISSOLVE 1 TABLET UNDER THE TONGUE AS NEEDED FOR CHEST PAIN EVERY 5 MINUTES UP TO 3 TIMES. IF NO RELIEF CALL 911.    ondansetron  (ZOFRAN ) 4 mg, EVERY 8 HOURS PRN    pantoprazole  (PROTONIX ) 40 mg, Oral, 2 TIMES DAILY    rosuvastatin  (CRESTOR ) 40 mg, Oral, EVERY EVENING    tiotropium bromide  (SPIRIVA RESPIMAT) 2.5 mcg/actuation Inhalation oral inhaler 2 Puffs, Inhalation, Daily        ROS:  Please see HPI.  All other systems were reviewed by me and are negative.                O:  PHYSICAL EXAM:    BP 120/68   Pulse 94   Temp 36.1 C (97 F) (Thermal Scan)   Ht 1.651 m (5' 5)   Wt 96.9 kg (213 lb 10 oz)   SpO2 96%   BMI 35.55 kg/m       General:  NAD.  Vital signs reviewed.   Skin:  Warm, dry, and without suspicious lesion.    HEENT:  Head is normocephalic.  Hearing adequate for conversation.   Pulmonary:  Respirations unlabored.  No audible wheezes.     Cardiovascular:  No JVD.   MS:  Ambulates without difficulty.  Significant bilateral CVA tenderness      Labs:  WBC   Date Value Ref Range Status    05/20/2023 11.8 (H) 3.7 - 11.0 x10^3/uL Final     HGB   Date Value Ref Range Status   05/20/2023 15.1 11.5 - 16.0 g/dL Final     HEMOGLOBIN J8R   Date Value Ref Range Status   01/26/2024 5.6 4.0 - 5.6 % Final     CREATININE   Date Value Ref Range Status   01/26/2024 0.94 0.60 - 1.05 mg/dL Final       Urine:   U/A-  Urine Dip Results:  Results for orders placed or performed in visit on 05/11/24 (from the past 12 hours)   POC URINALYSIS (RESULTS)   Result Value Ref Range    COLOR Yellow Yellow    CLARITY Clear Clear    GLUCOSE Negative Negative mg/dL    BILIRUBIN Negative Negative mg/dL    KETONES Negative Negative mg/dL    SPECIFIC GRAVITY 8.979 1.005 - 1.030    BLOOD Negative Negative mg/dL    PH 7.5 5.0 - <1.9    PROTEIN 30 (A) Negative, Trace mg/dL    UROBILINOGEN 1.0 0.2 , 1.0 mg/dL    NITRITE Negative Negative    LEUKOCYTES Trace (A) Negative WBCs/uL     *Note: Due to a large number of results and/or encounters for the requested time period, some results have not been displayed. A complete set of results can be found in Results Review.     Bladder scan for postvoid residual 7 cc    Diagnostic Studies-  The patient reportedly had a renal ultrasound in the family Medicine Clinic which showed hydronephrosis.  Unfortunately, I do not see the films or report.      A:    1. Severe bilateral flank pain  2. Reported hydronephrosis   3.  Refractory urinary frequency and urgency status post Botox  injection on 04/16/2024      P:  Due to her severe flank pain with reported hydronephrosis I have recommended the patient be evaluated in the emergency department.    No orders of the defined types were placed in this encounter.      Donnice Siskin, APRN,FNP-BC  05/11/2024, 12:15   Donnice Siskin, APRN  [place]  [facility]  Department of Urology  Phone- [phone]  Fax- [phone]          [1]   Allergies  Allergen Reactions    Cymbalta  [Duloxetine ]      Rash on feet and hands    Blue Dye      Told to  avoid due to patch test result by Elita LUNA    Flavoring Agent  Other Adverse Reaction (Add comment)     Patient unsure of reaction    Lipitor [Atorvastatin ] Myalgia     Muscle pain  Lisinopril  Other Adverse Reaction (Add comment)     Cough      Nickel      Blisters  Allergic to all kinds of metal     Codeine  Itching     RASH    Mobic  [Meloxicam ] Nausea/ Vomiting     Nervous and shakes     Naprosyn  [Naproxen ] Nausea/ Vomiting    Nsaids (Non-Steroidal Anti-Inflammatory Drug) Nausea/ Vomiting

## 2024-05-11 NOTE — ED Nurses Note (Signed)
 Pt left unit ambulatory with discharge instructions. Pt verbalized understanding of instructions given. PIV removed with catheter intact. Pressure dressing applied. No s/s distress noted at time of discharge.

## 2024-05-11 NOTE — Telephone Encounter (Signed)
 Hedy Motto, MD to Me  (Selected Message)        05/11/24  9:42 AM  I would prefer that this patient be seen today by urology if possible. On my POCUS in clinic, she did have some ureteral dilation and mild hydronephrosis.      Thank you!  Ryan     ~~~~~~~~~~~~~~~~~~~~~~~~~~~~~    [facility] Urology # back, only received VM,   Left a message with my direct # to return call     Tillman Oar, RN  05/11/2024  10:00

## 2024-05-11 NOTE — ED Nurses Note (Addendum)
 Pt arrived to ED with CC of bilateral flank pain. Patient was seen at [facility] and sent to ED.  10/10 pain . Difficulty urinating unlessI push it out.  Antibiotic taken last night and this morning.  Denies blood in urine.  Symptoms began about 3 days ago.     PMH: sibling had kidney failure. None for patient.     UA provided upstairs about an hour ago. Bladder scan=0     Anticoagulant: YES     Bed in low position, side rails up, patient educated on fall safety and use of call bell and side rails (pt voiced understanding), call bell within reach, connected to monitor.

## 2024-05-11 NOTE — ED Attending Handoff Note (Signed)
 Summary  68 y.o. F complaining urinary retention and flank pain radiating to abdomen 3 days ago  PCP FM doc saw hydronephrosis, saw urology clinic who sent her here for evaluation for flank pain  Received Botox  for overactive bladder in June no known complications  Urinalysis pending  CT Abd Pelvis: no hydronephrosis wait final read  Order Toradol  with pain  If pain improved, D/C      Pending  Labs:***  Imaging:***  Consults:***        Plan      Course

## 2024-05-11 NOTE — ED Nurses Note (Incomplete)
 Patient reports bilateral flank pain.  P

## 2024-05-11 NOTE — ED Resident Handoff Note (Signed)
 Summary  68 y.o. F complaining urinary retention and flank pain radiating to abdomen 3 days ago  PCP FM doc saw hydronephrosis on U/S, referred to  urology clinic who sent her here for evaluation for flank pain  Received Botox  for overactive bladder in June no known complications  Urinalysis pending  CT Abd Pelvis: no hydronephrosis wait final read        Pending  Labs: Urinalysis: WBC, few bacteria  Imaging: CT Abd Pelvis: No acute abnormality within the abdomen or pelvis. No hydronephrosis or obstructing urolithiasis. Left lung base groundglass nodule, stable in comparison to 07/28/2023, however increased in size compared to 12/21/2019. Follow-up CT in 12 months is recommended to ensure stability. None pending.  Consults: None        Plan  Give Toradol  for pain management. Antibiotics if urinalysis is positive for infection.    Course  Patient received on handoff from Dr. Margurite. 68 y.o. F w/ hx of overactive bladder requiring Botox  (03/2024) presenting for flank pain radiating to abdomen and urinary retention that started 3 days ago. Patient went and saw her PCP FM who saw hydronephrosis on ultrasound.  Patient was referred to urology clinic where they sent her to [facility] ED due to concerns of severe flank pain.  Denies fever, nausea, vomiting, hematuria.  Upon arrival, patient nontoxic, in distress secondary to pain, satting well on room air.  Physical exam showed bilat CVA tenderness and generalized abdominal tenderness. CT abdomen pelvis: No acute abnormality within the abdomen or pelvis. No hydronephrosis or obstructing urolithiasis. Left lung base groundglass nodule, stable in comparison to 07/28/2023, however increased in size compared to 12/21/2019. Follow-up CT in 12 months is recommended to ensure stability. Urinalysis significant for WBC, several squamous cell and few bacteria. Pain well controlled with Toradol  and Tylenol . Patient was given cefpodoxime  200 mg BID for 7 days in urology clinic. Patient advised  to finish antibiotic dose and follow up with PCP once finished with course. Patient understands to return if symptoms persist, worsen, or if new symptoms appear. Patient agreeable to plan. Patient discharged home in stable condition.

## 2024-05-11 NOTE — ED Provider Notes (Signed)
 [facility] - Emergency Department  ED Primary Note  History of Present Illness   [name] is a 68 y.o. female who had concerns including Flank Pain. Patient reports a history of overactive bladder for which she received a botox  injection in late June. She reports doing well until three days ago when she began having flank pain and noticed that her bladder didn't feel empty when she went to the bathroom, and she had to strain to empty her bladder. She went to her family medicine doctor, where the patient reports they did an ultrasound and found hydronephrosis. Patient endorses abdominal pain. Patient denies hematuria, N/V, diarrhea, fever, or chills.     Physical Exam   ED Triage Vitals   BP (Non-Invasive) 05/11/24 1300 130/76   Heart Rate 05/11/24 1300 92   Respiratory Rate 05/11/24 1300 20   Temperature 05/11/24 1300 36.3 C (97.3 F)   SpO2 05/11/24 1300 98 %   Weight 05/11/24 1301 96.9 kg (213 lb 10 oz)   Height 05/11/24 1301 1.651 m (5' 5)     Physical Exam  Vitals reviewed.   Constitutional:       General: She is in acute distress.   HENT:      Head: Normocephalic and atraumatic.      Mouth/Throat:      Mouth: Mucous membranes are moist.   Cardiovascular:      Rate and Rhythm: Normal rate.      Heart sounds: Normal heart sounds.   Pulmonary:      Effort: Pulmonary effort is normal. No respiratory distress.      Breath sounds: Normal breath sounds.   Abdominal:      General: Abdomen is flat. There is no distension.      Palpations: Abdomen is soft.      Tenderness: There is generalized abdominal tenderness. There is left CVA tenderness.   Skin:     General: Skin is warm and dry.   Neurological:      Mental Status: She is alert and oriented to person, place, and time.       Patient Data   Labs Ordered/Reviewed   BASIC METABOLIC PANEL - Abnormal; Notable for the following components:       Result Value    CHLORIDE 120 (*)     CO2 TOTAL 20 (*)     All other components within  normal limits   CBC/DIFF    Narrative:     The following orders were created for panel order CBC/DIFF.  Procedure                               Abnormality         Status                     ---------                               -----------         ------                     CBC WITH IPQQ[[id number]]                                    Final result  Please view results for these tests on the individual orders.   CBC WITH DIFF   URINALYSIS, MACROSCOPIC AND MICROSCOPIC W/CULTURE REFLEX    Narrative:     The following orders were created for panel order URINALYSIS, MACROSCOPIC AND MICROSCOPIC W/CULTURE REFLEX.  Procedure                               Abnormality         Status                     ---------                               -----------         ------                     URINALYSIS, MACROSCOPIC[[id number]]                                                     URINALYSIS, MICROSCOPIC[[id number]]                                                       Please view results for these tests on the individual orders.   URINALYSIS, MACROSCOPIC   URINALYSIS, MICROSCOPIC     CT ABDOMEN PELVIS W IV CONTRAST   Final Result by Edi, Radresults In (07/18 1606)   1. No acute abnormality within the abdomen or pelvis. No hydronephrosis or obstructing urolithiasis.   2. Left lung base groundglass nodule, stable in comparison to 07/28/2023, however increased in size compared to 12/21/2019. Follow-up CT in 12 months is recommended to ensure stability.          Medical Decision Making  Patient is a 68 yo F who presents to the ED today for flank pain.     Orders: CBC, BMP, UA, and CT abdomen and pelvis     Amount and/or Complexity of Data Reviewed  Radiology: ordered.  ECG/medicine tests: independent interpretation performed.    Risk  Prescription drug management.  Parenteral controlled substances.      ED Course as of 05/11/24 1608   Fri May 11, 2024   1332 Patient seen on initial assessment - patient is obviously  uncomfortable, atraumatic, normocephalic, and hemodynamically stable     1434 CBC/DIFF   1434 BASIC METABOLIC PANEL, NON-FASTING(!)  Non critical values           Medications Ordered/Administered in the ED   ketorolac  (TORADOL ) 15 mg/mL injection (has no administration in time range)   fentaNYL  (SUBLIMAZE ) 50 mcg/mL injection (50 mcg Intravenous Given 05/11/24 1432)   iopamidol  (ISOVUE -370) 76% infusion (100 mL Intravenous Given 05/11/24 1441)       Disposition: pending at sign out     Tinnie Lat, MD   Emergency Medicine  PGY-1

## 2024-05-11 NOTE — ED Nurses Note (Signed)
 Patient returned from CT

## 2024-05-12 ENCOUNTER — Ambulatory Visit (HOSPITAL_COMMUNITY): Payer: Self-pay

## 2024-05-12 LAB — URINE CULTURE: URINE CULTURE: <10000

## 2024-05-14 ENCOUNTER — Ambulatory Visit: Payer: Self-pay | Admitting: Urology

## 2024-05-16 ENCOUNTER — Encounter (HOSPITAL_BASED_OUTPATIENT_CLINIC_OR_DEPARTMENT_OTHER): Payer: Self-pay

## 2024-05-16 ENCOUNTER — Ambulatory Visit (INDEPENDENT_AMBULATORY_CARE_PROVIDER_SITE_OTHER): Admitting: Chiropractor

## 2024-05-16 ENCOUNTER — Other Ambulatory Visit: Payer: Self-pay

## 2024-05-16 DIAGNOSIS — M9902 Segmental and somatic dysfunction of thoracic region: Secondary | ICD-10-CM

## 2024-05-16 DIAGNOSIS — M51369 Other intervertebral disc degeneration, lumbar region without mention of lumbar back pain or lower extremity pain: Secondary | ICD-10-CM

## 2024-05-16 DIAGNOSIS — M791 Myalgia, unspecified site: Secondary | ICD-10-CM

## 2024-05-16 DIAGNOSIS — M9901 Segmental and somatic dysfunction of cervical region: Secondary | ICD-10-CM

## 2024-05-16 DIAGNOSIS — M9903 Segmental and somatic dysfunction of lumbar region: Secondary | ICD-10-CM

## 2024-05-16 DIAGNOSIS — M47812 Spondylosis without myelopathy or radiculopathy, cervical region: Secondary | ICD-10-CM

## 2024-05-16 DIAGNOSIS — M9904 Segmental and somatic dysfunction of sacral region: Secondary | ICD-10-CM

## 2024-05-16 NOTE — Progress Notes (Signed)
 [facility]  [address], [address]  [place] [address]  [phone]        Name: [name]  MRN: [id number]  Date:05/16/2024  Birthday: 04-12-1956      Chief Complaint   Patient presents with    Back Pain    Neck Pain    Myalgia         Subjective:  [name] presents for follow up care. She states that her neck/back are feeling a little better today, she rates her back pain a 6/10. She states that she had some back spasms a few days ago. She denies having anymore episodes of urinary retention.    Objective:       [place] Pain Rating Scale     On a scale of 0-10, during the past 24 hours, pain has interfered with you usual activity:       On a scale of 0-10, during the past 24 hours, pain has interfered with your sleep:      On a scale of 0-10, during the past 24 hours, pain has affected your mood:       On a scale of 0-10, during the past 24 hours, pain has contributed to your stress:       On a scale of 0-10, what is your overall pain Rating: 6    Cervical ROM: Decreased.  UE DTR 2+ equal.  UE motor 5/5 bilaterally.  UE sensory: Grossly intact.  Lumbar ROM: Decreased.  LE DTR 2+ equal.  LE motor 5/5 bilaterally.  LE sensory: Grossly intact.  Joint mobility restrictions: C1-C2, T5-T6, L5-S1, right upper sacroliliac  Tender points/Trigger points: Full spine.  Hypertonicity: Cervical, thoracic, and lumbar paraspinals.      Assessment:       ICD-10-CM    1. Segmental dysfunction of lumbar region  M99.03       2. 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 Segmental dysfunction of sacral region  M99.04       4. Segmental dysfunction of thoracic region  M99.02       5. Segmental dysfunction of cervical region  M99.01       6. 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 Myalgia  M79.10           Plan:  2x/ week for 4 weeks.    Today's treatment:    Chiropractic manipulation of the right SI with drop piece to improve joint  function and mobility.   Pt tolerated today's visit well.    CPT Codes: 01059.    [place] JINNY [place], [place]

## 2024-05-29 ENCOUNTER — Encounter (HOSPITAL_COMMUNITY): Payer: Self-pay | Admitting: CARDIOVASCULAR DISEASE

## 2024-05-29 ENCOUNTER — Other Ambulatory Visit: Payer: Self-pay

## 2024-05-29 ENCOUNTER — Ambulatory Visit: Payer: Self-pay | Attending: Cardiovascular Disease | Admitting: CARDIOVASCULAR DISEASE

## 2024-05-29 VITALS — BP 124/76 | HR 87 | Temp 97.5°F | Ht 65.0 in | Wt 216.5 lb

## 2024-05-29 DIAGNOSIS — I10 Essential (primary) hypertension: Secondary | ICD-10-CM

## 2024-05-29 DIAGNOSIS — Z955 Presence of coronary angioplasty implant and graft: Secondary | ICD-10-CM

## 2024-05-29 DIAGNOSIS — I25119 Atherosclerotic heart disease of native coronary artery with unspecified angina pectoris: Secondary | ICD-10-CM

## 2024-05-29 DIAGNOSIS — R079 Chest pain, unspecified: Secondary | ICD-10-CM | POA: Insufficient documentation

## 2024-05-29 DIAGNOSIS — Z87891 Personal history of nicotine dependence: Secondary | ICD-10-CM

## 2024-05-29 DIAGNOSIS — E785 Hyperlipidemia, unspecified: Secondary | ICD-10-CM

## 2024-05-29 MED ORDER — ISOSORBIDE MONONITRATE ER 60 MG TABLET,EXTENDED RELEASE 24 HR
60.0000 mg | ORAL_TABLET | Freq: Every morning | ORAL | 3 refills | Status: DC
Start: 2024-05-29 — End: 2024-05-29

## 2024-05-29 MED ORDER — ISOSORBIDE MONONITRATE ER 60 MG TABLET,EXTENDED RELEASE 24 HR
60.0000 mg | ORAL_TABLET | Freq: Every morning | ORAL | 3 refills | Status: DC
Start: 2024-05-29 — End: 2024-08-13

## 2024-05-30 ENCOUNTER — Ambulatory Visit (INDEPENDENT_AMBULATORY_CARE_PROVIDER_SITE_OTHER): Payer: Self-pay | Admitting: Rehabilitative and Restorative Service Providers"

## 2024-05-30 ENCOUNTER — Encounter (INDEPENDENT_AMBULATORY_CARE_PROVIDER_SITE_OTHER): Payer: Self-pay | Admitting: Chiropractor

## 2024-06-10 NOTE — Progress Notes (Signed)
 [facility]  [address]  [place] [address]  Phone: [phone]  Fax: [phone]    PATIENT NAME: [name] NUMBER: [id number]  DATE OF SERVICE: 06/10/24  DATE OF BIRTH: July 05, 1956  Age: 68 y.o.    Requesting Physician: Dalton, Kristi, PA-C     NEWPatient Clinic Visit     Chief Concern     Chief Complaint   Patient presents with    Follow Up       History of Presenting Illness/Subjective     68 year old female with coronary artery disease s/p recent PCI x3 w/ DES x2 to LAD & x1 to Lcx, dyslipidemia, GERD. Patient presents to clinic today to establish care.     Patient was recently admitted to [facility] general with complaints of chest pain. She had ruled out for ACS. She did undergo a cardiac catheterization on 04/18/2023 for which she received PCI with stent placement to the LAD and left circumflex. She was discharged home on aspirin  and Brilinta. The patient tells me that she felt good for approximately 2 weeks after her discharge. Over the last few months she has developed a persistent cough. Patient medications were switched with stoppage of her ACE/ARB but she continues to have cough. She is on many inhalers but doesn't see pulmonary.    The patient tells me she was evaluated by her PCP who ordered a chest x-ray which was unremarkable at that time. She did also have a WBC elevated by her report. Patient currently doesn't have any significant symptoms from cardiac standpoint. She underwent recently TTE which was essentially normal.       Problem List                           Problem List[1]      Past Medical History     Current Outpatient Medications   Medication Sig    acetaminophen  (TYLENOL ) 500 mg Oral Tablet Take 2 Tablets (1,000 mg total) by mouth Three times a day as needed for Pain    albuterol  sulfate (PROAIR  HFA) 90 mcg/actuation Inhalation oral inhaler Take 2 Puffs by inhalation Every 4 hours as needed    aspirin  81 mg Oral Tablet, Chewable Chew 1  Tablet (81 mg total) Every morning    baclofen  (LIORESAL ) 5 mg Oral Tablet Take 1 Tablet (5 mg total) by mouth Three times a day    clobetasoL  (TEMOVATE ) 0.05 % Ointment Apply topically Twice daily    desvenlafaxine  (PRISTIQ ) 100 mg Oral Tablet Sustained Release 24 hr Take 1 Tablet (100 mg total) by mouth Daily    diclofenac  sodium (VOLTAREN ) 1 % Gel Apply topically Four times a day - before meals and bedtime    famotidine  (PEPCID ) 40 mg Oral Tablet Take 1 Tablet (40 mg total) by mouth Twice daily    gabapentin  (NEURONTIN ) 800 mg Oral Tablet Take 1 Tablet (800 mg total) by mouth Twice daily    ipratropium bromide  (ATROVENT ) 42 mcg (0.06 %) Nasal Spray, Non-Aerosol Administer 2 Sprays into affected nostril(s) Three times a day for 90 days    isosorbide  mononitrate (IMDUR ) 60 mg Oral Tablet Sustained Release 24 hr Take 1 Tablet (60 mg total) by mouth Every morning for 360 days    metoprolol  succinate (TOPROL -XL) 25 mg Oral Tablet Sustained Release 24 hr Take 1 Tablet (25 mg total) by mouth Daily    mirabegron  (MYRBETRIQ ) 25 mg Oral Tablet Sustained Release  24 hr Take 1 Tablet (25 mg total) by mouth Daily    montelukast  (SINGULAIR ) 10 mg Oral Tablet Take 1 Tablet (10 mg total) by mouth Every evening    nitroGLYCERIN  (NITROSTAT ) 0.4 mg Sublingual Tablet, Sublingual DISSOLVE 1 TABLET UNDER THE TONGUE AS NEEDED FOR CHEST PAIN EVERY 5 MINUTES UP TO 3 TIMES. IF NO RELIEF CALL 911.    ondansetron  (ZOFRAN ) 4 mg Oral Tablet Take 1 Tablet (4 mg total) by mouth Every 8 hours as needed for Nausea/Vomiting    pantoprazole  (PROTONIX ) 40 mg Oral Tablet, Delayed Release (E.C.) Take 1 Tablet (40 mg total) by mouth Twice daily    rosuvastatin  (CRESTOR ) 40 mg Oral Tablet Take 1 Tablet (40 mg total) by mouth Every evening    tiotropium bromide  (SPIRIVA RESPIMAT) 2.5 mcg/actuation Inhalation oral inhaler Take 2 Inhalations (2 Puffs total) by inhalation Once a day     Allergies   Allergen Reactions    Cymbalta  [Duloxetine ]      Rash on  feet and hands    Blue Dye      Told to avoid due to patch test result by [employer]    Flavoring Agent  Other Adverse Reaction (Add comment)     Patient unsure of reaction    Lipitor [Atorvastatin ] Myalgia     Muscle pain    Lisinopril  Other Adverse Reaction (Add comment)     Cough      Nickel      Blisters  Allergic to all kinds of metal     Codeine  Itching     RASH    Mobic  [Meloxicam ] Nausea/ Vomiting     Nervous and shakes     Naprosyn  [Naproxen ] Nausea/ Vomiting    Nsaids (Non-Steroidal Anti-Inflammatory Drug) Nausea/ Vomiting     Past Medical History:   Diagnosis Date    Anxiety     Arthritis     Awareness under anesthesia     woke during colonoscopy    Cancer (CMS HCC)     internal skin cancer    Chest pain 02/19/2016    Chronic pain     lower back    Depression     Dyspnea on exertion     GERD (gastroesophageal reflux disease)     controlled w/med    H/O hearing loss     b/l, no aids    H/O urinary tract infection     Sept 2024- resolved    Headache     Hearing loss     Heart murmur 1979    benign    History of coronary angioplasty with insertion of stent 05/09/2023    Hyperlipidemia LDL goal < 100 08/29/2013    denies    Migraine     MINOR CAD (coronary artery disease) 11/10/2012    no treatment other than cholesterol medications; follows regularly with K.Dalton, PA    Multinodular goiter     Muscle weakness     neck     Myocardial infarction 05/09/2023    04/18/23, sees [place] cards    Neck problem     spinal stenosis    Obesity     Peripheral edema     Rash     eczema    Rheumatic fever     in 1974 no complications    S/P thyroidectomy     Shortness of breath     DOE    Sleep apnea     Squamous cell carcinoma 02/06/2015  right side of neck    Thyroid  disorder     s/p part thyroidectomy    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    Tinnitus     Vaginal prolapse 2012    surgery improved significantly    Wears glasses      Past Surgical History:   Procedure Laterality Date    CATARACT EXTRACTION Right  12/07/2022    CHALAZION EXCISION Left 12/22/2021    and conj lesion excision from caruncle    COLONOSCOPY  01/06/2009    COLONOSCOPY performed by KARIN, AHMED F at [facility] OR ENDO    GASTROSCOPY  03/05/2010    GASTROSCOPY performed by WAVERLY, SWATI at [facility] OR ENDO    GASTROSCOPY WITH BIOPSY  03/05/2010    GASTROSCOPY WITH BIOPSY performed by WAVERLY PLEAS at [facility] OR ENDO    HX ADENOIDECTOMY      HX ANKLE FRACTURE TX  2007    left distal fibula, casted    HX APPENDECTOMY      HX CHOLECYSTECTOMY      HX COLONOSCOPY      HX CYSTOCELE REPAIR  09/24/2009    HX HAND SURGERY  2012    for Carpal tunnel : right side treated    HX HEART CATHETERIZATION      HX HYSTERECTOMY      HX OOPHORECTOMY      left ovary removed    HX PAROTIDECTOMY       surgical clamp     HX PARTIAL THYROIDECTOMY Right 04/05/2019    R hemithyroidectomy for nodule, final path follicular adenoma    HX TONSILLECTOMY      HX TOTAL VAGINAL HYSTERECTOMY  1979    HX WISDOM TEETH EXTRACTION      HX WRIST FRACTURE TX Left 2007    FOOSH injury    PARATHYROID  GLAND SURGERY      PARS PLANA VITRECTOMY Right 11/02/2021    Pars plana vitrectomy, nternal limiting membrane (ILM) and epiretinal membrane (ERM) peel, Injection of 20%  sf6 gas, Subtenon's injection of Cefuroxime  50 mg and Dexamethasone  2 mg    PB REVISE ULNAR NERVE AT ELBOW Left 1979    PB UPPER GI ENDOSCOPY,BIOPSY  12/19/2007    patulous GE junction zone, erythema, nonerosive GERD    SEPTOPLASTY       Family History   Family History[2]        Social History   Social History[3]      Review Of Systems      All review of systems negative except as otherwise noted in the HPI.    Physical Examination     BP 124/76 (Site: Left Arm, Patient Position: Sitting, Cuff Size: Adult)   Pulse 87   Temp 36.4 C (97.5 F)   Ht 1.651 m (5' 5)   Wt 98.2 kg (216 lb 7.9 oz)   SpO2 98% Comment: ra  BMI 36.03 kg/m         GENERAL: Patient is alert oriented to time, place and person. No acute distress  HEENT:  Head is  normocephalic, atraumatic.  Extraocular motions are intact.  Sclerae are nonicteric.   NECK:  Supple, without JVD or carotid bruits.   LUNGS:  Breath sounds are clear to auscultation bilaterally.   CARDIOVASCULAR:  Reveals a regular rate and rhythm with normal S1 and S2. No murmur, rub or gallop present  LOWER EXTREMITIES:  Appear well-perfused without edema.    Transthoracic Echocardiogram     Results for  orders placed or performed during the hospital encounter of 02/19/16   TRANSTHORACIC ECHOCARDIOGRAM - ADULT    Narrative     Left ventricle: LV wall thickness is mildly increased. Concentric   hypertrophy present. The systolic function is normal with an ejection   fraction range of 55% - 60%. LV diastolic function is normal. There is no   evidence of increased ventricular filling pressure by Doppler parameters.        *Note: Due to a large number of results and/or encounters for the requested time period, some results have not been displayed. A complete set of results can be found in Results Review.     Left ventricle: EF 55-60%  Right ventricle: The cavity size is dilated. Wall thickness is normal. Systolic function is reduced. Systolic pressure is within the normal range. The estimated peak pressure is 30 mm Hg assuming that the right atrial pressure was 8 mmlg.  Left atrium: The atrium is normal in size.  Atrial septum: The septum was structurally nornal.  Right atrium: The atrium is normal in size.  Mitral valve: The valve is structurally normal. No stenosis. There is trivial regurgitation. The mean diastolic gradient is 2 mm Hg. The valve area (LVOT continuity) is 2.3 cm?.  Tricuspid valve: The valve is structurally normal. No stenosis. There is trivial regurgitation. The peak diastolic gradient is 2 mm Hg.  Aortic valve: The valve is structurally normal. The valve is trileaffet. Cusp separation is normal. Velocity is within the normal range. There is no stenosis. There is no regurgitation. The mean systolic  gradient is 4 mm Hg. The LVOT to aortic valve VTI ratio is 0.66. The valve area by the velocity-time integral method is 2.0 cm*.  Pulmonic valve: Not weil visualized. No stenosis. There is no significant regurgitation. The peak systolic gradient is 4 mm      Percutaneous coronary intervention with stent placement of left anterior descending using a 3.0  40 overlapped with x a 3.0 x 13 [place] stents.  5. Percutaneous coronary intervention with stent placement of left circumflex using a 3.0 x 18 [place] stent.  6. Intravascular ultrasound of left anterior descending.  7. Intravascular ultrasound of left circumflex.    Labs     The ASCVD Risk score (Arnett DK, et al., 2019) failed to calculate for the following reasons:    Risk score cannot be calculated because patient has a medical history suggesting prior/existing ASCVD    Lab Results   Component Value Date    TRIG 135 11/29/2023    HDLCHOL 44 (L) 11/29/2023    LDLCHOL 62 11/29/2023    CHOLESTEROL 130 11/29/2023        Lab Results   Component Value Date    SODIUM 145 05/11/2024    POTASSIUM 4.7 05/11/2024    BUN 12 05/11/2024    CREATININE 0.96 05/11/2024    GFR 65 05/11/2024    GLUCOSENF 101 03/05/2019    GLUCOSEFAST 104 08/29/2013         Assessment & Plan       68 yo F w/ Hx of CAD s/p PCI x3 to LAD & Lcx who presents to clinic for follow up.    Impression:  #CAD s/p PCI x3 w/ DES x2 to LAD & x1 to the Lcx in 03/2023 at [facility] general  #HTN  #HLD  #Elevated BMI at 35.44  #Cough    Plan:  - Agree w/ OMT w/ APT & BB. D/c brilinta  as patient PCI >12 months ago  - TTE w/o any abnormalities. Patient doing well from cardiac standpoint  - Increase statin dose, recheck lipid panel  - Increase Imdur  to 60 mg once daily  - RTC in 1 year    Orders Placed This Encounter    isosorbide  mononitrate (IMDUR ) 60 mg Oral Tablet Sustained Release 24 hr     Return in about 1 year (around 05/29/2025).        / Sherrod Nida, MD  Cardiology Fellow, PGY-V  Division of  Cardiovascular Medicine  Department of Medicine  [place]  [facility]  Pager [phone]      Attending Attestation:   Date of service: 05/29/2024. I saw and examined the patient.  I reviewed the fellow's note.  I agree with the findings and plan of care as documented in the fellow's note.  Any exceptions/additions are edited/noted.    Sherron Sandhoff, MD   Assistant Professor- Noninvasive Cardiology  [place]           [1]   Patient Active Problem List  Diagnosis    Migraine Headaches    GERD not well controlled    Patellofemoral pain syndrome    Multinodular goiter    Microscopic hematuria    Neck pain    Lichen sclerosus et atrophicus of the vulva    Carpal tunnel syndrome on right    Osteoarthritis, knee    Left knee pain    Coronary artery disease involving native coronary artery of native heart with angina pectoris (CMS HCC)    Hyperlipidemia LDL goal < 100    Otitis media of both ears    Mass of parotid gland, right    Squamous cell carcinoma    Chest pain    Dysuria    Multiple pulmonary nodules determined by computed tomography of lung    History of rheumatic fever    Neuropathy (CMS HCC)    PTSD (post-traumatic stress disorder)    Pain due to neuropathy of facial nerve    S/P thyroidectomy    GAD (generalized anxiety disorder)    Severe episode of recurrent major depressive disorder, 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)    Right hip pain    Hand pain    Cervical neck pain with evidence of disc disease    Lumbar pain with radiation down right leg    History of coronary angioplasty with insertion of stent    Myocardial infarction    Severe obesity (BMI 35.0-35.9 with comorbidity) (CMS HCC)    Shoulder pain    Bronchiectasis without complication (CMS HCC)    OAB (overactive bladder)    Stress incontinence    Cough    Hiatal hernia    Ground glass opacity present on imaging of lung    IgG2 subclass deficiency (CMS HCC)    Screening for colorectal cancer    Personal  history of adenomatous and serrated colon polyps    Elevated serum creatinine    Other chronic pain    Bladder irritation   [2]   Family History  Problem Relation Name Age of Onset    Diabetes Mother      High Cholesterol Mother      Hypertension (High Blood Pressure) Mother      Coronary Artery Disease Mother  72        CABG    Congestive Heart Failure Father  46        died  at 58    Coronary Artery Disease Father      Heart Attack Father      Heart Attack Sister Elora         53    Kidney Disease Sister Tylene         ESRD    Thyroid  Disease Sister Nena     Heart Attack Sister Shelila     Hypertension (High Blood Pressure) Sister      Hypertension (High Blood Pressure) Sister      Hypertension (High Blood Pressure) Brother      Stroke Paternal Grandmother      Bipolar Disorder Daughter      Cancer Paternal Aunt          Breast Ca    Breast Cancer Paternal Aunt      Cancer Paternal Aunt          Breast Ca    Breast Cancer Paternal Aunt      Cancer Paternal Aunt          Breast Ca    Breast Cancer Paternal Aunt      Cancer Paternal Aunt          Breast Ca    Breast Cancer Paternal Aunt      Cancer Paternal Aunt          Breast Ca    Breast Cancer Paternal Aunt      Leukemia Paternal Uncle      Bipolar Disorder Paternal Uncle          bone    Cancer Other First cousin 60        Breast Ca/Breast Ca    Breast Cancer Other First cousin    [3]   Social History  Socioeconomic History    Marital status: Divorced    Number of children: 3   Occupational History    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     Comment: occasionally   Tobacco Use    Smoking status: Former     Current packs/day: 0.00     Average packs/day: 2.0 packs/day for 40.0 years (80.0 ttl pk-yrs)     Types: Cigarettes     Start date: 01/22/1965     Quit date: 01/22/2005     Years since quitting: 19.6    Smokeless tobacco: Never   Vaping Use    Vaping status: Never Used   Substance and Sexual Activity    Alcohol use: No      Alcohol/week: 0.0 standard drinks of alcohol    Drug use: No    Sexual activity: Yes     Partners: Male   Other Topics Concern    Uses Cane No    Uses walker No    Uses wheelchair No    Right hand dominant Yes    Left hand dominant No    Ambidextrous No    Ability to Walk 1 Flight of Steps without SOB/CP Yes    Routine Exercise No    Ability to Walk 2 Flight of Steps without SOB/CP Yes     Comment: SOB    Total Care No    Ability To Do Own ADL's Yes    Uses Walker No    Other Activity Level Yes     Comment: housework     Uses Cane No    Shift Work No    Unusual Sleep-Wake Schedule No   Social History Narrative  Right handed.      Social Determinants of Health     Social Connections: Medium Risk (09/18/2024)    Social Connections     SDOH Social Isolation: 3 to 5 times a week

## 2024-06-11 ENCOUNTER — Other Ambulatory Visit: Payer: Self-pay

## 2024-06-12 ENCOUNTER — Ambulatory Visit: Payer: Self-pay | Attending: ORTHOPEDIC, SPORTS MEDICINE | Admitting: ORTHOPEDIC, SPORTS MEDICINE

## 2024-06-12 ENCOUNTER — Encounter (HOSPITAL_BASED_OUTPATIENT_CLINIC_OR_DEPARTMENT_OTHER): Payer: Self-pay | Admitting: ORTHOPEDIC, SPORTS MEDICINE

## 2024-06-12 VITALS — Temp 97.6°F | Ht 65.0 in | Wt 216.7 lb

## 2024-06-12 DIAGNOSIS — G8929 Other chronic pain: Secondary | ICD-10-CM | POA: Insufficient documentation

## 2024-06-12 DIAGNOSIS — M170 Bilateral primary osteoarthritis of knee: Secondary | ICD-10-CM | POA: Insufficient documentation

## 2024-06-12 DIAGNOSIS — S8392XD Sprain of unspecified site of left knee, subsequent encounter: Secondary | ICD-10-CM | POA: Insufficient documentation

## 2024-06-12 DIAGNOSIS — M25562 Pain in left knee: Secondary | ICD-10-CM | POA: Insufficient documentation

## 2024-06-12 NOTE — Progress Notes (Signed)
 Name: [name]  MEDICAL RECORD [id number]  DATE OF BIRTH: Jan 20, 1956  DATE OF SERVICE: 06/12/2024    SUBJECTIVE:  [name] presents to clinic today for follow up of Knee Pain (bilateral) and Follow Up After Injection   She is following up for the knee pain.  We had given her an injection in both knees back on April 17, 2024. She noted great relief on the right knee, really did not get any significant benefit on the left knee.  She is having persistent pain and swelling.  She notes the leg below the knee will swell as well, but pain is focused on the knee.  She describes a catching sensation within the knee.        MEDICATIONS  Current Outpatient Medications   Medication Sig    acetaminophen  (TYLENOL ) 500 mg Oral Tablet Take 2 Tablets (1,000 mg total) by mouth Three times a day as needed for Pain    albuterol  sulfate (PROAIR  HFA) 90 mcg/actuation Inhalation oral inhaler Take 2 Puffs by inhalation Every 4 hours as needed    aspirin  81 mg Oral Tablet, Chewable Chew 1 Tablet (81 mg total) Every morning    baclofen  (LIORESAL ) 5 mg Oral Tablet Take 1 Tablet (5 mg total) by mouth Three times a day    clobetasoL  (TEMOVATE ) 0.05 % Ointment Apply topically Twice daily    desvenlafaxine  (PRISTIQ ) 100 mg Oral Tablet Sustained Release 24 hr Take 1 Tablet (100 mg total) by mouth Daily    diclofenac  sodium (VOLTAREN ) 1 % Gel Apply topically Four times a day - before meals and bedtime    famotidine  (PEPCID ) 40 mg Oral Tablet Take 1 Tablet (40 mg total) by mouth Twice daily    gabapentin  (NEURONTIN ) 800 mg Oral Tablet Take 1 Tablet (800 mg total) by mouth Twice daily    ipratropium bromide  (ATROVENT ) 42 mcg (0.06 %) Nasal Spray, Non-Aerosol Administer 2 Sprays into affected nostril(s) Three times a day for 90 days    isosorbide  mononitrate (IMDUR ) 60 mg Oral Tablet Sustained Release 24 hr Take 1 Tablet (60 mg total) by mouth Every morning for 360 days    metoprolol  succinate (TOPROL -XL) 25 mg Oral Tablet Sustained  Release 24 hr Take 1 Tablet (25 mg total) by mouth Daily    mirabegron  (MYRBETRIQ ) 25 mg Oral Tablet Sustained Release 24 hr Take 1 Tablet (25 mg total) by mouth Daily    montelukast  (SINGULAIR ) 10 mg Oral Tablet Take 1 Tablet (10 mg total) by mouth Every evening    nitroGLYCERIN  (NITROSTAT ) 0.4 mg Sublingual Tablet, Sublingual DISSOLVE 1 TABLET UNDER THE TONGUE AS NEEDED FOR CHEST PAIN EVERY 5 MINUTES UP TO 3 TIMES. IF NO RELIEF CALL 911.    ondansetron  (ZOFRAN ) 4 mg Oral Tablet Take 1 Tablet (4 mg total) by mouth Every 8 hours as needed for Nausea/Vomiting    pantoprazole  (PROTONIX ) 40 mg Oral Tablet, Delayed Release (E.C.) Take 1 Tablet (40 mg total) by mouth Twice daily    rosuvastatin  (CRESTOR ) 40 mg Oral Tablet Take 1 Tablet (40 mg total) by mouth Every evening    tiotropium bromide  (SPIRIVA RESPIMAT) 2.5 mcg/actuation Inhalation oral inhaler Take 2 Inhalations (2 Puffs total) by inhalation Once a day     ALLERGIES  Allergies[1]    PHYSICAL EXAM  Temp 36.4 C (97.6 F) (Thermal Scan)   Ht 1.651 m (5' 5)   Wt 98.3 kg (216 lb 11.4 oz)   BMI 36.06 kg/m  Right knee:  There is no effusion.  There is really no significant tenderness.  Left knee:  There is mild effusion.  She is somewhat diffusely tender but more focused on the medial joint line both anteriorly and posteriorly.  Modified McMurray maneuver causes diffuse pain in the knee.  The knee is ligamentously stable.    Previous knee radiographs were reviewed and demonstrate mild to moderate degenerative arthritis in the medial greater than lateral compartment.        ASSESSMENT:  Assessment/Plan   1. Chronic pain of left knee    2. Sprain of left knee, unspecified ligament, subsequent encounter    3. Primary osteoarthritis of both knees      PLAN:  At today's visit, we discussed treatment options.  She has persistent left knee pain disproportionate to degree of arthritis.    Get MRi to assess internal derangement likely meniscal pathology.   If the  patient has any questions or concerns, she can contact our office.      Morene Houseman, MD 06/12/2024, 17:06           [1]   Allergies  Allergen Reactions    Cymbalta  [Duloxetine ]      Rash on feet and hands    Blue Dye      Told to avoid due to patch test result by Elita LUNA    Flavoring Agent  Other Adverse Reaction (Add comment)     Patient unsure of reaction    Lipitor [Atorvastatin ] Myalgia     Muscle pain    Lisinopril  Other Adverse Reaction (Add comment)     Cough      Nickel      Blisters  Allergic to all kinds of metal     Codeine  Itching     RASH    Mobic  [Meloxicam ] Nausea/ Vomiting     Nervous and shakes     Naprosyn  [Naproxen ] Nausea/ Vomiting    Nsaids (Non-Steroidal Anti-Inflammatory Drug) Nausea/ Vomiting

## 2024-06-20 ENCOUNTER — Encounter (HOSPITAL_COMMUNITY): Payer: Self-pay

## 2024-06-21 ENCOUNTER — Encounter (HOSPITAL_COMMUNITY): Payer: Self-pay

## 2024-06-22 ENCOUNTER — Encounter (HOSPITAL_COMMUNITY): Payer: Self-pay

## 2024-06-26 ENCOUNTER — Encounter (INDEPENDENT_AMBULATORY_CARE_PROVIDER_SITE_OTHER): Payer: Self-pay

## 2024-06-26 ENCOUNTER — Ambulatory Visit (INDEPENDENT_AMBULATORY_CARE_PROVIDER_SITE_OTHER): Payer: Self-pay | Admitting: Internal Medicine

## 2024-06-26 ENCOUNTER — Encounter (HOSPITAL_COMMUNITY)

## 2024-06-26 ENCOUNTER — Other Ambulatory Visit (INDEPENDENT_AMBULATORY_CARE_PROVIDER_SITE_OTHER): Payer: Self-pay | Admitting: NURSE PRACTITIONER

## 2024-06-26 ENCOUNTER — Encounter (INDEPENDENT_AMBULATORY_CARE_PROVIDER_SITE_OTHER): Payer: Self-pay | Admitting: Internal Medicine

## 2024-06-26 ENCOUNTER — Other Ambulatory Visit: Payer: Self-pay

## 2024-06-26 ENCOUNTER — Ambulatory Visit
Payer: Self-pay | Attending: Student in an Organized Health Care Education/Training Program | Admitting: Internal Medicine

## 2024-06-26 VITALS — BP 131/75 | HR 82 | Temp 97.3°F | Ht 65.0 in | Wt 219.6 lb

## 2024-06-26 DIAGNOSIS — R053 Chronic cough: Secondary | ICD-10-CM | POA: Insufficient documentation

## 2024-06-26 DIAGNOSIS — Z6835 Body mass index (BMI) 35.0-35.9, adult: Secondary | ICD-10-CM | POA: Insufficient documentation

## 2024-06-26 DIAGNOSIS — Z1211 Encounter for screening for malignant neoplasm of colon: Secondary | ICD-10-CM

## 2024-06-26 DIAGNOSIS — Z87891 Personal history of nicotine dependence: Secondary | ICD-10-CM

## 2024-06-26 DIAGNOSIS — K219 Gastro-esophageal reflux disease without esophagitis: Secondary | ICD-10-CM | POA: Insufficient documentation

## 2024-06-26 DIAGNOSIS — Z6836 Body mass index (BMI) 36.0-36.9, adult: Secondary | ICD-10-CM

## 2024-06-26 DIAGNOSIS — Z23 Encounter for immunization: Secondary | ICD-10-CM | POA: Insufficient documentation

## 2024-06-26 DIAGNOSIS — R918 Other nonspecific abnormal finding of lung field: Secondary | ICD-10-CM | POA: Insufficient documentation

## 2024-06-26 DIAGNOSIS — K449 Diaphragmatic hernia without obstruction or gangrene: Secondary | ICD-10-CM | POA: Insufficient documentation

## 2024-06-26 DIAGNOSIS — J479 Bronchiectasis, uncomplicated: Secondary | ICD-10-CM | POA: Insufficient documentation

## 2024-06-26 DIAGNOSIS — D803 Selective deficiency of immunoglobulin G [IgG] subclasses: Secondary | ICD-10-CM | POA: Insufficient documentation

## 2024-06-26 MED ORDER — FLU VACC 2025-26(65YR UP)-MF59C(PF) 45 MCG(15 MCGX3)/0.5 ML IM SYRINGE
0.5000 mL | INJECTION | Freq: Once | INTRAMUSCULAR | 0 refills | Status: AC
Start: 2024-06-26 — End: 2024-06-26

## 2024-06-26 MED ORDER — ALBUTEROL SULFATE HFA 90 MCG/ACTUATION AEROSOL INHALER
2.0000 | INHALATION_SPRAY | RESPIRATORY_TRACT | 0 refills | Status: DC | PRN
Start: 2024-06-26 — End: 2024-07-31

## 2024-06-26 NOTE — Telephone Encounter (Signed)
 peg

## 2024-06-26 NOTE — Progress Notes (Signed)
 [facility]   Pulmonary Clinic Note  Date: 06/26/24  Name: [name]  MRN: [id number]     Chief complaint: Cough and Bronchiectasis    Last clinic visit: 08/03/2023    History of Present Illness  [name] is a 67 year old female with chronic cough and acid reflux who presents for evaluation of persistent cough.    She has experienced a persistent dry cough since 2024, which worsens with talking and is non-productive. She manages the symptoms with water  and cough drops. Lisinopril was discontinued due to concerns it might be causing the cough, but the cough persisted. She was on Brilinta for a year, which she completed two weeks ago. She uses albuterol  once a day, primarily when going upstairs, but reports that Spiriva worsens her cough. She has not been taking Singulair  as she forgot to refill it.    She has significant acid reflux, described as 'really bad,' even when sleeping elevated. She takes Protonix  and Pepcid  twice a day. Half of her vocal cords were found to be swollen, possibly due to acid reflux. She has a hiatal hernia and is awaiting a procedure to further evaluate her reflux, scheduled for November 2025.    She has a history of smoking two packs a day for forty years, quitting in 2006. A ground glass nodule in the left lower lobe of her lung has slightly increased in size from 2021 to 2024. No current pets but was previously exposed to birds through her daughter.    She has osteoarthritis affecting her ankles, knees, hands, and spine, and spinal stenosis in her cervical spine. 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 She experienced a heart attack in June of the previous year.    No productive cough, wheezing, or asthma-like symptoms. Reports daytime sleepiness and occasional gasping for air.       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  Rheumatologic:  none  Travel:  none  Pets:  none, daughter had birds  Family history:  granddaughter with asthma  mMRC dyspnea score today:  1    Pulmonary medications:  Spiriva 2 puffs daily (makes it worse)  Singulair  10 mg nightly (been off, didn't get it refilled)  Albuterol  PRN (once a day when going up stairs)  Atrovent  and Flonase  nasal sprays    Pulmonary vaccinations:   Pulmonary vaccines due   Topic Date Due    Influenza Vaccine (1) 06/25/2024    Covid-19 Vaccine (4 - 2025-26 season) 06/25/2024     Influenza vaccine Ordered     COVID vaccine Patient plans to receive elsewhere         Physical Exam  VITALS: BP 131/75 (Site: Left Arm, Patient Position: Sitting)   Pulse 82   Temp 36.3 C (97.3 F) (Thermal Scan)   Ht 1.651 m (5' 5)   Wt 99.6 kg (219 lb 9.6 oz)   SpO2 98% Comment: room air  BMI 36.54 kg/m     GENERAL:  Alert, cooperative, well developed, no acute distress  HEENT: Normocephalic, normal oropharynx, moist mucous membranes  CHEST: Clear to auscultation bilaterally, no wheezes, rhonchi, or crackles  CARDIOVASCULAR: Normal heart rate and rhythm, S1 and S2 normal without murmurs  ABDOMEN: Soft, non-distended  EXTREMITIES: No cyanosis or edema  NEUROLOGICAL: alert, oriented, no focal deficits    Results  LABS  A1c: 5.6 (01/26/2024)    RADIOLOGY  Chest CT: Mild bronchiectasis in bilateral lower lobes and right middle lobe, ground glass subpleural nodule in left lower lobe slightly increased in size from 2021 to 2024  (07/28/2023)    DIAGNOSTIC  Bronchoscopy: Unremarkable, minimal fungal growth with five colonies, negative  for pneumonia and bacteria (08/11/2023)    Pulmonary Function Test: normal spirometry, flow/volume loop, lung volumes.  Mildly reduced diffusion capacity (07/01/2023)      Assessment & Plan  Chronic cough likely secondary to gastroesophageal reflux disease with hiatal hernia  Chronic cough since 2024, likely due to GERD with hiatal hernia. Symptoms include persistent cough exacerbated by talking, with no sputum production. GERD is suspected to cause laryngeal irritation and vocal cord swelling, contributing to the cough. Current treatment includes Protonix  and Pepcid  twice daily, but symptoms persist. Awaiting gastroenterology evaluation with EGD for potential anatomical correction of hiatal hernia. Discussed potential need for surgical intervention if hiatal hernia is confirmed as the cause of reflux and cough.  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 Previous bronchoscopy showed minimal fungal growth, not clinically significant. No evidence of bacterial infection or pneumonia. IgG2 subclass found to be slightly below average.  As above, significant reflux with possible aspiration, which could explain the lower lobe bronchiectasis.  - Refer to immunology due to bronchiectasis in the setting of IgG subclass 2 deficiency    Pulmonary nodule, left lower lobe, ground glass, stable  Ground glass pulmonary nodule in the left lower lobe, stable since 2021 with slight increase in size by 2024. Low  suspicion for malignancy due to slow growth rate and transparency of nodule. Smoking history increases risk, but cessation in 2006 reduces likelihood of malignancy.  - Order CT chest in October 2025 to monitor nodule stability    History of tobacco use (80 pack-years, quit 2006)  Significant smoking history of 80 pack-years, quit in 2006. Increased risk for lung cancer due to smoking history, but cessation has reduced risk over time. No current tobacco use or exposure.  - Continue to abstain from smoking    Immunization: Influenza vaccine administration  Influenza vaccination due as part of routine immunization schedule. Last vaccination was over a year ago.  - Ordered influenza vaccine to be given at her pharmacy    Recording duration: 28 minutes    Return to clinic: Return in about 3 months (around 09/25/2024), or if symptoms worsen or fail to improve, for In Person Visit.   Patient seen and discussed with attending physician: Dr. Wynell Metz    Orders Placed This Encounter    CT CHEST  WO IV CONTRAST    CANCELED: Flu Vaccine, 65+,0.5 mL IM (Admin)    Refer to [facility] All[facility]/Immunology,[facility]at [facility]    albuterol  sulfate (PROAIR  HFA) 90 mcg/actuation Inhalation oral inhaler    flu vac 2025 65up-adjMF59C,PF, (FLUAD) 45 mcg (15 mcg x 3)/0.5 mL IntraMUSCULAR Syringe       This note was created with assistance from [employer] via capture of conversational audio. Consent was obtained from the patient and all parties present prior to recording.      Norleen Ruth, MD  Fellow, Pulmonary & Critical Care Medicine  Department of Medicine  Pager [phone]       06/26/2024  I saw and examined the patient.  I reviewed the fellow's note.  I agree with the findings and plan of care as documented in the fellow's note.  Any exceptions/additions are edited/noted.    Sena Metz, MD

## 2024-06-27 ENCOUNTER — Encounter (HOSPITAL_COMMUNITY): Payer: Self-pay

## 2024-06-27 ENCOUNTER — Other Ambulatory Visit: Payer: Self-pay

## 2024-06-27 DIAGNOSIS — Z1211 Encounter for screening for malignant neoplasm of colon: Secondary | ICD-10-CM

## 2024-06-27 MED ORDER — PEG 3350-ELECTROLYTES 236 GRAM-22.74 GRAM-6.74 GRAM-5.86 GRAM SOLUTION
4.0000 L | Freq: Once | ORAL | 0 refills | Status: AC
Start: 2024-06-27 — End: 2024-06-29
  Filled 2024-06-27: qty 4000, 1d supply, fill #0

## 2024-06-28 ENCOUNTER — Encounter (HOSPITAL_COMMUNITY): Payer: Self-pay

## 2024-06-28 ENCOUNTER — Inpatient Hospital Stay (HOSPITAL_COMMUNITY)
Admission: RE | Admit: 2024-06-28 | Discharge: 2024-06-28 | Disposition: A | Discharge status: Home / Self Care | Source: Ambulatory Visit

## 2024-06-28 ENCOUNTER — Other Ambulatory Visit: Payer: Self-pay

## 2024-06-28 NOTE — Nurses Notes (Signed)
 [facility] MEDICINE    Preoperative Evaluation Center   Department of Anesthesiology      Name: [name], [name]   DOB: 01-Jan-1956    PRE-PROCEDURE/OPERATIVE INSTRUCTIONS-ADULT GI  Preoperative Evaluation Center [facility])  [address] [place], [place] [address]    Thank you for choosing [facility] for your health care needs. [facility] is a tobacco-free campus. Please refrain from using any forms of tobacco while on the premises.     Please review upon receipt and again prior to procedure date.    If, after your PEC call or visit, you experience any of the changes below or develop any of the listed symptoms, Call the Preoperative Evaluation Center [facility]) at [phone].   Change in your best contact phone number.  Changes in your medications, especially starting a new medication.  Changes in your medical history, including an Emergency room visit, a hospital admission, or you receive a new diagnosis.    Develop any of these symptoms:  fever, cough, sore throat, shortness of breath, chills, muscle pain, new loss of taste or smell, vomiting or diarrhea, or fatigue (extreme tiredness or lack of energy).  Diagnosed with conjunctivitis (pink eye) or shingles.  Diagnosed as COVID positive.  Develop a wound, rash, open area or sores.  If you use or are prescribed an antibiotic.     Also contact your PCP to discuss your symptom(s) and the potential need for treatment/ appointments/etc.     PROCEDURE:    Procedure(s):  COLONOSCOPY    DATE of SURGERY:   July 19, 2024    ARRIVAL LOCATION:   1st Floor Registration     Arrival time and diet instructions will be given the business day prior to your procedure between 1030 am to 1 pm. If you should not hear from anyone by 2 pm the business day prior to your procedure, please call [phone] (Option 3).    Patients and visitors may park for free in the lots in front of the hospital. If you are need assistance from [place] parking lot, we have transportation available to  you.  Call security at [phone] to arrange pick up from the parking lot.    DIET INSTRUCTIONS:   Clear liquids ONLY the day prior to procedure.   At 4 pm begin the bowel prep by completing half of the ordered prep.   Take the remainder of the prep, 5 hours before your scheduled arrival time.   Continue Clear liquids up to 3 hours before the time of the surgery/procedure,  then NPO (NPO means NO FOOD or DRINK).    ACCEPTABLE CLEAR LIQUIDS: Water , fruit juices (white grape or apple), pedialyte, gatorade, contrast dye (limited to non-particulate forms, such as gastrografin ), coffee & tea without fat/milk/creamer, and clear broth without fat or protein. SABRA     LIQUIDS NOT ACCEPTED: Orange juice, any product colored red/blue/purple, coffee or tea with fat/milk/creamer, or any alcoholic beverages.        Your diet instructions are specific for your safety. When not followed, particles left in your stomach could enter your lungs during surgery. Failure to follow the instructions could result in a delay or cancellation of your surgery.      No tobacco (cigarettes, vaping, snuff, or chewing tobacco) or medical marijuana (all types) 8 hours prior to scheduled arrival time.  No Alcohol, or recreational drugs, including marijuana, 24 hours prior to scheduled arrival time.  No Chewing gum is permitted.    MEDICATION INSTRUCTIONS:  Meds to take day of procedure with a sip of water : baclofen , pristiq , pepcid , neurontin , myrbetriq , protonix , tylenol , proair , zofran      Meds to stop: HOLD ASA, imdur  the day of surgery    Avoid taking:?Iron and fiber supplements for 5 days prior to your procedure.        PREP:   Procedure prep instructions will come from the GI Office.    TRANSPORTATION:  You must arrange for a responsible person, 18 or older, to drive you home and stay for 24 hours following discharge after the procedure. Public transportation may only be used in the event you still have the responsible person with you, drivers  of the transportation are not responsible for your care.  If you have not arranged for a driver and responsible person to accompany you, your procedure will be cancelled.      If you use home oxygen, bring enough for travel to hospital and return trip home.     OTHER IMPORTANT INFORMATION:  Do not bring money, jewelry, or valuables with you.    Do not wear make-up, nail polish, lotion, powder, or aftershave. Do not wear any jewelry, watches, earrings, rings, or body piercings.  Piercings, including silicone, will be required to be removed in Pre-op  area prior to surgery.  Remove your continuous glucose monitoring system prior to arrival as staff are required to check your glucose using facility monitors. Feel free to bring your device with you as you can reapply it after surgery, upon discharge.    Bring Programmer for implantable devices (Deep Brain Stimulator or Nerve Stimulator for pain/mental health/sleep apnea, etc.) as they may be required to be turned off.  No visitors under age 11 are permitted in the preparation/recovery areas. It is strongly suggested that child-care arrangements be made.   Wear glasses instead of contact lenses. If possible, bring a case for your glasses.  It may be possible to wear your hearing aid throughout the procedure. Discuss this with the Pre op nursing staff.  Wear comfortable shoes and clean clothes to the hospital as you will wear them home after the procedure.  Bring any equipment - crutches, splints, etc., that you are already using at home.  Bring any legal paperwork with you (guardianship, custody, Medical Power of Attorney, Designer, industrial/product, etc) if applicable.  Before using the bathroom at the hospital, check with hospital staff for needed specimens.    LODGING:    The [facility] provide "homes away from home" for families of patients. If you, the patient, plan to stay, a responsible person 34 or older, is required to stay with you for 24 hours following  discharge, the same as if you are going home after discharge from facility. The [facility], located across the parking lot from the hospital provides lodging and supportive services to adult patients and their families. To be eligible, guests must live 50 miles or more from the [place] area and request a referral from a hospital staff person to be placed on the waiting list. The [facility] staff and volunteers can assist you with hotel reservations, usually at a discounted rate, until a room becomes available. [phone].    For more information on local motels and a list of private homes providing rental rooms, contact Social Services at [phone].    SPIRITUAL CARE:  [facility] has chaplains available every day for your spiritual needs. Our Interfaith Prayer and Meditation Room is located at [facility] on  the first floor between the [employer] and elevators. You may ask your nurse or staff member to contact the on-call chaplain anytime.    For questions regarding instructions please contact PEC at [phone]. Leave a message if no answer.      CANCEL/PROCEDURE/SURGERY:   If you must cancel your procedure, call [phone] between 8am and 4:30pm.  After 4:30pm, call the [facility] at [phone] and ask for the GI Fellow on call.

## 2024-06-29 ENCOUNTER — Other Ambulatory Visit (HOSPITAL_BASED_OUTPATIENT_CLINIC_OR_DEPARTMENT_OTHER): Payer: Self-pay | Admitting: Physician Assistant

## 2024-07-01 MED ORDER — MIRABEGRON ER 25 MG TABLET,EXTENDED RELEASE 24 HR
25.0000 mg | ORAL_TABLET | Freq: Every day | ORAL | 1 refills | Status: DC
Start: 2024-07-01 — End: 2024-08-13

## 2024-07-01 MED ORDER — GABAPENTIN 800 MG TABLET
800.0000 mg | ORAL_TABLET | Freq: Two times a day (BID) | ORAL | 1 refills | Status: DC
Start: 2024-07-01 — End: 2024-08-13

## 2024-07-03 ENCOUNTER — Ambulatory Visit (INDEPENDENT_AMBULATORY_CARE_PROVIDER_SITE_OTHER): Payer: Self-pay | Admitting: Internal Medicine

## 2024-07-19 ENCOUNTER — Ambulatory Visit (HOSPITAL_COMMUNITY): Admitting: Certified Registered"

## 2024-07-19 ENCOUNTER — Encounter (HOSPITAL_COMMUNITY)
Admission: RE | Disposition: A | Discharge status: Home / Self Care | Payer: Self-pay | Source: Ambulatory Visit | Attending: Student in an Organized Health Care Education/Training Program

## 2024-07-19 ENCOUNTER — Other Ambulatory Visit: Payer: Self-pay

## 2024-07-19 ENCOUNTER — Encounter (HOSPITAL_COMMUNITY): Payer: Self-pay | Admitting: Student in an Organized Health Care Education/Training Program

## 2024-07-19 ENCOUNTER — Ambulatory Visit (HOSPITAL_COMMUNITY): Admitting: Radiology

## 2024-07-19 ENCOUNTER — Ambulatory Visit
Admission: RE | Admit: 2024-07-19 | Discharge: 2024-07-19 | Disposition: A | Discharge status: Home / Self Care | Payer: MEDICAID | Source: Ambulatory Visit | Attending: Student in an Organized Health Care Education/Training Program | Admitting: Student in an Organized Health Care Education/Training Program

## 2024-07-19 DIAGNOSIS — Z860100 Personal history of colon polyps, unspecified: Secondary | ICD-10-CM | POA: Insufficient documentation

## 2024-07-19 DIAGNOSIS — K6289 Other specified diseases of anus and rectum: Secondary | ICD-10-CM | POA: Insufficient documentation

## 2024-07-19 DIAGNOSIS — D122 Benign neoplasm of ascending colon: Secondary | ICD-10-CM | POA: Insufficient documentation

## 2024-07-19 DIAGNOSIS — K5730 Diverticulosis of large intestine without perforation or abscess without bleeding: Secondary | ICD-10-CM

## 2024-07-19 DIAGNOSIS — Z1211 Encounter for screening for malignant neoplasm of colon: Secondary | ICD-10-CM

## 2024-07-19 DIAGNOSIS — K648 Other hemorrhoids: Secondary | ICD-10-CM

## 2024-07-19 DIAGNOSIS — Z860101 Personal history of adenomatous and serrated colon polyps: Secondary | ICD-10-CM

## 2024-07-19 SURGERY — COLONOSCOPY
Anesthesia: Monitor Anesthesia Care | Site: Anus | Wound class: Clean Contaminated Wounds-The respiratory, GI, Genital, or urinary

## 2024-07-19 MED ORDER — FENTANYL (PF) 50 MCG/ML INJECTION SOLUTION
12.5000 ug | INTRAMUSCULAR | Status: DC | PRN
Start: 2024-07-19 — End: 2024-07-19

## 2024-07-19 MED ORDER — LIDOCAINE (PF) 20 MG/ML (2 %) INJECTION SOLUTION
INTRAMUSCULAR | Status: AC
Start: 2024-07-19 — End: 2024-07-19
  Filled 2024-07-19: qty 5

## 2024-07-19 MED ORDER — SODIUM CHLORIDE 0.9% FLUSH BAG - 250 ML
INTRAVENOUS | Status: DC | PRN
Start: 2024-07-19 — End: 2024-07-19

## 2024-07-19 MED ORDER — SODIUM CHLORIDE 0.9 % (FLUSH) INJECTION SYRINGE
2.0000 mL | INJECTION | Freq: Three times a day (TID) | INTRAMUSCULAR | Status: DC
Start: 2024-07-19 — End: 2024-07-19

## 2024-07-19 MED ORDER — HYDRALAZINE 20 MG/ML INJECTION SOLUTION
10.0000 mg | Freq: Once | INTRAMUSCULAR | Status: DC | PRN
Start: 2024-07-19 — End: 2024-07-19

## 2024-07-19 MED ORDER — SODIUM CHLORIDE 0.9 % (FLUSH) INJECTION SYRINGE
2.0000 mL | INJECTION | INTRAMUSCULAR | Status: DC | PRN
Start: 2024-07-19 — End: 2024-07-19

## 2024-07-19 MED ORDER — LACTATED RINGERS INTRAVENOUS SOLUTION
INTRAVENOUS | Status: DC
Start: 2024-07-19 — End: 2024-07-19

## 2024-07-19 MED ORDER — PROCHLORPERAZINE EDISYLATE 10 MG/2 ML (5 MG/ML) INJECTION SOLUTION
5.0000 mg | Freq: Once | INTRAMUSCULAR | Status: DC | PRN
Start: 2024-07-19 — End: 2024-07-19

## 2024-07-19 MED ORDER — PROPOFOL 10 MG/ML INTRAVENOUS EMULSION
INTRAVENOUS | Status: DC | PRN
Start: 2024-07-19 — End: 2024-07-19
  Administered 2024-07-19: 200 ug/kg/min via INTRAVENOUS
  Administered 2024-07-19: 150 ug/kg/min via INTRAVENOUS
  Administered 2024-07-19: 175 ug/kg/min via INTRAVENOUS
  Administered 2024-07-19: 0 ug/kg/min via INTRAVENOUS

## 2024-07-19 MED ORDER — DEXTROSE 5% IN WATER (D5W) FLUSH BAG - 250 ML
INTRAVENOUS | Status: DC | PRN
Start: 2024-07-19 — End: 2024-07-19

## 2024-07-19 MED ORDER — FENTANYL (PF) 50 MCG/ML INJECTION SOLUTION
25.0000 ug | INTRAMUSCULAR | Status: DC | PRN
Start: 2024-07-19 — End: 2024-07-19

## 2024-07-19 MED ORDER — LIDOCAINE (PF) 20 MG/ML (2 %) INJECTION SOLUTION
Freq: Once | INTRAMUSCULAR | Status: DC | PRN
Start: 2024-07-19 — End: 2024-07-19
  Administered 2024-07-19: 100 mg via INTRAVENOUS

## 2024-07-19 MED ORDER — PROPOFOL 10 MG/ML IV BOLUS
INJECTION | Freq: Once | INTRAVENOUS | Status: DC | PRN
Start: 2024-07-19 — End: 2024-07-19
  Administered 2024-07-19: 80 mg via INTRAVENOUS

## 2024-07-19 SURGICAL SUPPLY — 4 items
FORCEPS ENDOS 240CM 2.8MM RJ 4 JMB DISP (ENDOSCOPIC SUPPLIES) IMPLANT
KIT ENDOSCOPIC COMPLIANCE ENDOKIT ORCAPOD 4 1.1OZ 1.1OZ CLEAN ADAPTER (ENDOSCOPIC SUPPLIES) IMPLANT
SNARE 2.8CM RND 240CM 10MM 2.4MM CAPTIVATR II LOOP STF SHEATH BRD WRE ENDOS PLPCTM STRL DISP (ENDOSCOPIC SUPPLIES) IMPLANT
TRAP SPECI REM 15CM ETRAP MAGNIFY WIND MSR GUIDE PLPCTM STRL LF  DISP (SPECIMEN COLLECTION SUPPLIES) IMPLANT

## 2024-07-19 NOTE — Anesthesia Postprocedure Evaluation (Signed)
 Anesthesia Post Op Evaluation    Patient: [name]  Procedure(s):  COLONOSCOPY    Last Vitals:Temperature: 36 C (96.8 F) (07/19/24 0855)  Heart Rate: 68 (07/19/24 0900)  BP (Non-Invasive): (!) 117/51 (07/19/24 0900)  Respiratory Rate: 17 (07/19/24 0900)  SpO2: 93 % (07/19/24 0900)    No notable events documented.    Patient is sufficiently recovered from the effects of anesthesia to participate in the evaluation and has returned to their pre-procedure level.  Patient location during evaluation: PACU       Patient participation: complete - patient participated  Level of consciousness: responsive to verbal stimuli    Pain management: adequate  Airway patency: patent    Anesthetic complications: no  Cardiovascular status: acceptable and hemodynamically stable  Respiratory status: acceptable and spontaneous ventilation  Hydration status: acceptable  Patient post-procedure temperature: Pt Normothermic   PONV Status: Absent

## 2024-07-19 NOTE — Discharge Instructions (Signed)
 SURGICAL DISCHARGE INSTRUCTIONS     Dr. Fernand Dellen, MD  performed your COLONOSCOPY today at the [facility]    [facility]:  Monday through Friday from 6 a.m. - 7 p.m.: [phone]  Between 7 p.m. - 6 a.m., weekends and holidays:  Call Healthline at [phone] or [phone].    PLEASE SEE WRITTEN HANDOUTS AS DISCUSSED BY YOUR NURSE:      SIGNS AND SYMPTOMS OF A WOUND / INCISION INFECTION   Be sure to watch for the following:  Increase in redness or red streaks near or around the wound or incision.  Increase in pain that is intense or severe and cannot be relieved by the pain medication that your doctor has given you.  Increase in swelling that cannot be relieved by elevation of a body part, or by applying ice, if permitted.  Increase in drainage, or if yellow / green in color and smells bad. This could be on a dressing or a cast.  Increase in fever for longer than 24 hours, or an increase that is higher than 101 degrees Fahrenheit (normal body temperature is 98 degrees Fahrenheit). The incision may feel warm to the touch.    **CALL YOUR DOCTOR IF ONE OR MORE OF THESE SIGNS / SYMPTOMS SHOULD OCCUR.    ANESTHESIA INFORMATION   ANESTHESIA -- ADULT PATIENTS:  You have received intravenous sedation / general anesthesia, and you may feel drowsy and light-headed for several hours. You may even experience some forgetfulness of the procedure. DO NOT DRIVE A MOTOR VEHICLE or perform any activity requiring complete alertness or coordination until you feel fully awake in about 24-48 hours. Do not drink alcoholic beverages for at least 24 hours. Do not stay alone, you must have a responsible adult available to be with you. You may also experience a dry mouth or nausea for 24 hours. This is a normal side effect and will disappear as the effects of the medication wear off.    REMEMBER   If you experience any difficulty breathing, chest pain, bleeding that you feel is excessive, persistent nausea  or vomiting or for any other concerns:  Call your physician Dr. Fernand at [phone] or [phone]. You may also ask to have the GI doctor on call paged. They are available to you 24 hours a day.    SPECIAL INSTRUCTIONS / COMMENTS   Instructions for Lower Endoscopy  Patient Information:  No driving until tomorrow, You may experience a small amount of blood in your stool if a biopsy has been taken , and Call in one week for biopsy results - Referring MD   REMEMBER TO:  Call your physician for any of the following symptoms:  1.) Persistent vomiting or vomiting bright red blood  2.) Fever (temperature greater than 100F)  3.) Severe abdominal pain or rigid bloated abdomen.  4.) Large amounts of bright red blood or maroon stools.      FOLLOW-UP APPOINTMENTS   Please call patient services at [phone] or [phone] to schedule a date / time of return. They are open Monday - Friday from 7:30 am - 5:00 pm.

## 2024-07-19 NOTE — Anesthesia Preprocedure Evaluation (Signed)
 ANESTHESIA PRE-OP  EVALUATION  Planned Procedure: COLONOSCOPY (Anus)  Review of Systems         patient summary reviewed          Pulmonary   sleep apnea and past history of smoking ,   Cardiovascular    Murmur, CAD, DOE, ECG reviewed, cardiac stents and hyperlipidemia , Exercise Tolerance: <4 METS        GI/Hepatic/Renal    GERD and well controlled        Endo/Other    obesity,      Neuro/Psych/MS    headaches, anxiety, depression, Neck problems    peripheral neuropathy,  Cancer  CA (RT parotid gland),                     Physical Assessment      Airway       Mallampati: II    TM distance: >3 FB    Neck ROM: full  Mouth Opening: good.  No Facial hair  No Beard        Dental           (+) upper dentures, missing           Pulmonary    Breath sounds clear to auscultation       Cardiovascular    Rhythm: regular  Rate: Normal       Other findings            Plan  ASA 3     Planned anesthesia type: MAC                         Intravenous induction     Anesthesia issues/risks discussed are: PONV, Cardiac Events/MI, Intraoperative Awareness/ Recall, Stroke and Aspiration.  Anesthetic plan and risks discussed with patient  signed consent obtained          Patient's NPO status is appropriate for Anesthesia.           Plan discussed with CRNA.

## 2024-07-19 NOTE — Anesthesia Transfer of Care (Signed)
 ANESTHESIA TRANSFER OF CARE   [name] is a 68 y.o. 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    Chest pain 02/19/2016    Chronic pain     lower back    Depression     Dyspnea on exertion     GERD (gastroesophageal reflux disease)     controlled w/med    H/O hearing loss     b/l, no aids    Headache     Hearing loss     Heart murmur 1979    benign    History of coronary angioplasty with insertion of stent 05/09/2023    Hyperlipidemia LDL goal < 100 08/29/2013    denies    Migraine     MINOR CAD (coronary artery disease) 11/10/2012    no treatment other than cholesterol medications; follows regularly with K.Dalton, PA    Multinodular goiter     Muscle weakness     neck     Myocardial infarction 05/09/2023    04/18/23, sees [facility] cards    Neck problem     spinal stenosis    Obesity     Peripheral edema     Rash     eczema    Rheumatic fever     in 1974 no complications    S/P thyroidectomy     Shortness of breath     DOE    Sleep apnea     Squamous cell carcinoma 02/06/2015    right side of neck    Thyroid  disorder     s/p part thyroidectomy    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    Tinnitus     Vaginal prolapse 2012    surgery improved significantly    Wears glasses       Allergy History as of 07/19/24       CODEINE          Noted Status Severity Type Reaction    12/10/15 0941 Pantojas, [place]  Active Low  Itching    Comments: RASH     09/19/13 1430 Celestia Palma Joy  Deleted Low  Itching    Comments: RASH     09/04/09 Almarie Landry Caldron  Active   Itching    Comments: RASH     10/10/07   Active       Comments: RASH               STRAWBERRY         Noted Status Severity Type Reaction    03/18/11 1634 Cathryne Numbers  Deleted        10/10/08 Kathren Darice HERO, MD  Deleted       10/10/07   Active                 MELOXICAM          Noted Status Severity Type Reaction    11/24/18 0857 Kisner, Warren Earnie, RN 06/26/09 Active Low  Nausea/ Vomiting    Comments: Nervous and shakes      12/10/15 0941 Pantojas, Tully 06/26/09 Active Low  Nausea/  Vomiting    06/26/09 Oreskovich, Janae Nacole 06/26/09 Active   Nausea/ Vomiting              NICKEL         Noted Status Severity Type Reaction    07/19/19 0959 Raj Olivia Longs, RN 07/24/12 Active  Topical     Comments: Blisters  Allergic to all kinds of metal      11/24/18 0856 Kisner, Warren Jansky, RN 07/24/12 Active  Topical     Comments: Blisters       07/24/12 1041 Kathren Darice HERO, MD 07/24/12 Active  Topical               BLUE DYE         Noted Status Severity Type Reaction    07/24/12 1041 Kathren Darice HERO, MD 07/24/12 Active  Topical     Comments: Told to avoid due to patch test result by Elita LUNA               DULOXETINE          Noted Status Severity Type Reaction    07/24/12 1052 Kathren Darice HERO, MD 07/24/12 Active Medium Systemic     Comments: Rash on feet and hands               NAPROXEN          Noted Status Severity Type Reaction    11/01/12 0913 Neda Melnick, MA 11/01/12 Active Low  Nausea/ Vomiting              ATORVASTATIN          Noted Status Severity Type Reaction    06/19/18 2149 Lang Knee, RX STUDENT 01/27/15 Active  Side Effect Myalgia    Comments: Muscle pain     01/27/15 1035 Kathren Darice HERO, MD 01/27/15 Active  Side Effect     Comments: Muscle pain               FLAVORING AGENT         Noted Status Severity Type Reaction    07/19/24 0636 Tedi Niemann, RN 05/03/17 Active    Other Adverse Reaction (Add comment)    Comments: Patient unsure of reaction. Found on skin test     07/19/19 0958 Raj Olivia Longs, RN 05/03/17 Active    Other Adverse Reaction (Add comment)    Comments: Patient unsure of reaction     04/03/18 1905 Lynae Nest, [place]  05/03/17 Active                 NSAIDS (NON-STEROIDAL ANTI-INFLAMMATORY DRUG)         Noted Status Severity Type Reaction    11/24/18 0857 Kisner, Warren Jansky, RN 05/03/17 Active Low  Nausea/ Vomiting    04/03/18 1905 Lynae Nest, MA 05/03/17 Active                 LISINOPRIL         Noted Status Severity Type Reaction    07/27/23 1339 Rosary Wilbert Kay, [place] 07/27/23 Active    Other Adverse Reaction (Add comment)    Comments: Cough                     I completed my transfer of care / handoff to the receiving personnel during which we discussed:  Additional Info:Pt. To area e stable. Report to RN                                     Last OR Temp: Temperature: 36 C (96.8 F)  ABG:  PCO2 (VENOUS)   Date Value Ref Range Status   02/19/2016 36.00 (L) 41.00 - 51.00 mm/Hg Final     PO2 (VENOUS)   Date Value Ref Range Status   02/19/2016 43.0 35.0 - 50.0 mm/Hg Final     POTASSIUM   Date Value Ref Range Status   05/11/2024 4.7 3.5 - 5.1 mmol/L Final   01/23/2015 4.3 3.5 - 5.1 mmol/L Final     KETONES   Date Value Ref Range Status   05/11/2024 Negative Negative mg/dL Final   92/81/7974 Negative Negative mg/dL Final     KOH PREP   Date Value Ref Range Status   08/11/2023 No yeast or hyphal elements seen.  Final     WHOLE BLOOD POTASSIUM   Date Value Ref Range Status   02/19/2016 3.9 3.5 - 5.0 mmol/L Final     CALCIUM    Date Value Ref Range Status   05/11/2024 9.3 8.6 - 10.3 mg/dL Final     Comment:     Gadolinium-containing contrast can interfere with calcium  measurement.     01/23/2015 9.8 8.5 - 10.4 mg/dL Final     Comment:     Test performed by [facility], [address], [place],  [place] [id number]       Calculated P Axis   Date Value Ref Range Status   08/11/2023 32 degrees Final     Calculated R Axis   Date Value Ref Range Status   08/11/2023 -22 degrees Final     Calculated T Axis   Date Value Ref Range Status   08/11/2023 -10 degrees Final     IONIZED CALCIUM    Date Value  Ref Range Status   02/19/2016 1.19 1.10 - 1.36 mmol/L Final     GLUCOSE, POINT OF CARE   Date Value Ref Range Status   02/06/2015 104 70 - 105 mg/dL Final     HEMOGLOBIN   Date Value Ref Range Status   02/19/2016 13.9 12.0 - 18.0 g/dL Final     OXYHEMOGLOBIN   Date Value Ref Range Status   02/19/2016 83.5 (H) 40.0 - 80.0 % Final     CARBOXYHEMOGLOBIN   Date Value Ref Range Status   02/19/2016 1.9 0.0 - 2.5 % Final     MET-HEMOGLOBIN   Date Value Ref Range Status   02/19/2016 0.1 0.0 - 3.5 % Final     BASE EXCESS   Date Value Ref Range Status   02/19/2016 0.6 -3.0 - 3.0 mmol/L Final     BICARBONATE (VENOUS)   Date Value Ref Range Status   02/19/2016 25.0 22.0 - 26.0 mmol/L Final     %FIO2 (VENOUS)   Date Value Ref Range Status   02/19/2016 28.0 % Final     Airway:* No LDAs found *  Blood pressure (!) 115/56, pulse 70, temperature 36 C (96.8 F), resp. rate 16, height 1.651 m (5' 5), weight 100 kg (220 lb 7.4 oz), SpO2 98%, not currently breastfeeding.

## 2024-07-19 NOTE — H&P (Signed)
 [name]  GI Admission History and Physical      [name], [name]   MRN:  [id number]  Date of Birth:  [birth year]    Date of Procedure:  07/19/2024    Chief Complaint: Personal history of polyps    HPI: [name], [name] is a 68 y.o. year old female who presents today for Colonoscopy.    This procedure is being done to evaluate Personal history of polyps.    The patient denies family history of colon cancer, hematochezia, melena, or unintentional weight loss.    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    Chest pain 02/19/2016    Chronic pain     lower back    Depression     Dyspnea on exertion     GERD (gastroesophageal reflux disease)     controlled w/med    H/O hearing loss     b/l, no aids    Headache     Hearing loss     Heart murmur 1979    benign    History of coronary angioplasty with insertion of stent 05/09/2023    Hyperlipidemia LDL goal < 100 08/29/2013    denies    Migraine     MINOR CAD (coronary artery disease) 11/10/2012    no treatment other than cholesterol medications; follows regularly with K.Dalton, PA    Multinodular goiter     Muscle weakness     neck     Myocardial infarction 05/09/2023    04/18/23, sees [facility] cards    Neck problem     spinal stenosis    Obesity     Peripheral edema     Rash     eczema    Rheumatic fever     in 1974 no complications    S/P thyroidectomy     Shortness of breath     DOE    Sleep apnea     Squamous cell carcinoma 02/06/2015    right side of neck    Thyroid  disorder     s/p part thyroidectomy    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    Tinnitus     Vaginal prolapse 2012    surgery improved significantly    Wears glasses            Allergies[1]    Medications Prior to Admission       Prescriptions    acetaminophen  (TYLENOL ) 500 mg Oral Tablet    Take 2 Tablets (1,000 mg total) by mouth Three  times a day as needed for Pain    albuterol  sulfate (PROAIR  HFA) 90 mcg/actuation Inhalation oral inhaler    Take 2 Puffs by inhalation Every 4 hours as needed    aspirin  81 mg Oral Tablet, Chewable    Chew 1 Tablet (81 mg total) Every morning    baclofen  (LIORESAL ) 5 mg Oral Tablet    Take 1 Tablet (5 mg total) by mouth Three times a day    clobetasoL  (TEMOVATE ) 0.05 % Ointment    Apply topically Twice daily    desvenlafaxine  (PRISTIQ ) 100 mg Oral Tablet Sustained Release 24 hr    Take 1 Tablet (100 mg total) by mouth Daily    diclofenac  sodium (VOLTAREN ) 1 %  Gel    Apply topically Four times a day - before meals and bedtime    famotidine  (PEPCID ) 40 mg Oral Tablet    Take 1 Tablet (40 mg total) by mouth Twice daily    flu vac 2025 [id number],PF, (FLUAD) 45 mcg (15 mcg x 3)/0.5 mL IntraMUSCULAR Syringe    Inject 0.5 mL into the muscle One time for 1 dose    gabapentin  (NEURONTIN ) 800 mg Oral Tablet    Take 1 Tablet (800 mg total) by mouth Twice daily Take 1 Tablet (800 mg total) by mouth Twice daily    ipratropium bromide  (ATROVENT ) 42 mcg (0.06 %) Nasal Spray, Non-Aerosol    Administer 2 Sprays into affected nostril(s) Three times a day for 90 days    isosorbide  mononitrate (IMDUR ) 60 mg Oral Tablet Sustained Release 24 hr    Take 1 Tablet (60 mg total) by mouth Every morning for 360 days    metoprolol  succinate (TOPROL -XL) 25 mg Oral Tablet Sustained Release 24 hr    Take 1 Tablet (25 mg total) by mouth Daily    Patient taking differently:  Take 1 Tablet (25 mg total) by mouth Every night    mirabegron  (MYRBETRIQ ) 25 mg Oral Tablet Sustained Release 24 hr    Take 1 Tablet (25 mg total) by mouth Daily Take 1 Tablet (25 mg total) by mouth Daily    nitroGLYCERIN  (NITROSTAT ) 0.4 mg Sublingual Tablet, Sublingual    DISSOLVE 1 TABLET UNDER THE TONGUE AS NEEDED FOR CHEST PAIN EVERY 5 MINUTES UP TO 3 TIMES. IF NO RELIEF CALL 911.    ondansetron  (ZOFRAN ) 4 mg Oral Tablet    Take 1 Tablet (4 mg total) by mouth Every 8  hours as needed for Nausea/Vomiting    pantoprazole  (PROTONIX ) 40 mg Oral Tablet, Delayed Release (E.C.)    Take 1 Tablet (40 mg total) by mouth Twice daily    PEG 3350 -Electrolytes 236-22.74-6.74 -5.86 gram Oral Recon Soln    Mix 4,000 mL as directed and take by mouth One time for 1 dose    rosuvastatin  (CRESTOR ) 40 mg Oral Tablet    Take 1 Tablet (40 mg total) by mouth Every evening             Past Surgical History:   Procedure Laterality Date    CATARACT EXTRACTION Right 12/07/2022    CHALAZION EXCISION Left 12/22/2021    and conj lesion excision from caruncle    COLONOSCOPY  01/06/2009    COLONOSCOPY performed by KARIN, AHMED F at [facility] OR ENDO    GASTROSCOPY  03/05/2010    GASTROSCOPY performed by WAVERLY, SWATI at [facility] OR ENDO    GASTROSCOPY WITH BIOPSY  03/05/2010    GASTROSCOPY WITH BIOPSY performed by WAVERLY PLEAS at [facility] OR ENDO    HX ADENOIDECTOMY      HX ANKLE FRACTURE TX  2007    left distal fibula, casted    HX APPENDECTOMY      HX CHOLECYSTECTOMY      HX COLONOSCOPY      HX CYSTOCELE REPAIR  09/24/2009    HX HAND SURGERY  2012    for Carpal tunnel : right side treated    HX HEART CATHETERIZATION      HX HYSTERECTOMY      HX OOPHORECTOMY      left ovary removed    HX PAROTIDECTOMY       surgical clamp     HX PARTIAL THYROIDECTOMY Right 04/05/2019  R hemithyroidectomy for nodule, final path follicular adenoma    HX TONSILLECTOMY      HX TOTAL VAGINAL HYSTERECTOMY  1979    HX WISDOM TEETH EXTRACTION      HX WRIST FRACTURE TX Left 2007    FOOSH injury    PARATHYROID  GLAND SURGERY      PARS PLANA VITRECTOMY Right 11/02/2021    Pars plana vitrectomy, nternal limiting membrane (ILM) and epiretinal membrane (ERM) peel, Injection of 20%  sf6 gas, Subtenon's injection of Cefuroxime  50 mg and Dexamethasone  2 mg    PB REVISE ULNAR NERVE AT ELBOW Left 1979    PB UPPER GI ENDOSCOPY,BIOPSY  12/19/2007    patulous GE junction zone, erythema, nonerosive GERD    SEPTOPLASTY             Physical Exam:  HEENT: Airway  patent, Trachea midline, No large goiters or neck lymphadenopathy  Heart: Regular rate and rhythm, no murmurs  Lungs: Clear to auscultation bilaterally without decreased breath sounds, rhonchi, or wheezes  Abdomen: Soft, nondistended, positive bowel sounds, nontender    Assessment:  Personal history of polyps    Plan:  Proceed with Colonoscopy. The patients bowel preparation consisted of  GoLytely.     Orders Placed This Encounter    OXYGEN - NASAL CANNULA    REMOVE SALINE LOCK    INSERT & MAINTAIN PERIPHERAL IV ACCESS    PERIPHERAL IV DRESSING CHANGE    REMOVE SALINE LOCK    INSERT & MAINTAIN PERIPHERAL IV ACCESS    PERIPHERAL IV DRESSING CHANGE    NS flush syringe    NS flush syringe    NS 250 mL flush bag    D5W 250 mL flush bag    LR premix infusion    NS flush syringe    NS flush syringe    NS 250 mL flush bag    D5W 250 mL flush bag    LR premix infusion    fentaNYL  (SUBLIMAZE ) 50 mcg/mL injection    fentaNYL  (SUBLIMAZE ) 50 mcg/mL injection    hydrALAZINE (APRESOLINE) injection 10 mg    prochlorperazine  (COMPAZINE ) 5 mg/mL injection    LR premix infusion       Joclynn Lumb Samir Pranav Lince, M.D.  Assistant Professor  Gastroenterology & Hepatology  07/19/2024 - 08:10           [1]   Allergies  Allergen Reactions    Cymbalta  [Duloxetine ]      Rash on feet and hands    Blue Dye      Told to avoid due to patch test result by Derm [facility]    Flavoring Agent  Other Adverse Reaction (Add comment)     Patient unsure of reaction. Found on skin test    Lipitor [Atorvastatin ] Myalgia     Muscle pain    Lisinopril  Other Adverse Reaction (Add comment)     Cough      Nickel      Blisters  Allergic to all kinds of metal     Codeine  Itching     RASH    Mobic  [Meloxicam ] Nausea/ Vomiting     Nervous and shakes     Naprosyn  [Naproxen ] Nausea/ Vomiting    Nsaids (Non-Steroidal Anti-Inflammatory Drug) Nausea/ Vomiting

## 2024-07-23 ENCOUNTER — Ambulatory Visit (HOSPITAL_COMMUNITY): Payer: Self-pay | Admitting: Student in an Organized Health Care Education/Training Program

## 2024-07-23 ENCOUNTER — Ambulatory Visit (HOSPITAL_BASED_OUTPATIENT_CLINIC_OR_DEPARTMENT_OTHER): Payer: Self-pay | Admitting: ORTHOPEDIC, SPORTS MEDICINE

## 2024-07-23 DIAGNOSIS — D122 Benign neoplasm of ascending colon: Secondary | ICD-10-CM

## 2024-07-23 LAB — SURGICAL PATHOLOGY SPECIMEN

## 2024-07-26 ENCOUNTER — Other Ambulatory Visit: Payer: Self-pay

## 2024-07-26 ENCOUNTER — Ambulatory Visit
Admission: RE | Admit: 2024-07-26 | Discharge: 2024-07-26 | Disposition: A | Discharge status: Home / Self Care | Source: Ambulatory Visit | Attending: Student in an Organized Health Care Education/Training Program | Admitting: Student in an Organized Health Care Education/Training Program

## 2024-07-26 DIAGNOSIS — J479 Bronchiectasis, uncomplicated: Secondary | ICD-10-CM | POA: Insufficient documentation

## 2024-07-26 DIAGNOSIS — K449 Diaphragmatic hernia without obstruction or gangrene: Secondary | ICD-10-CM | POA: Insufficient documentation

## 2024-07-26 DIAGNOSIS — K219 Gastro-esophageal reflux disease without esophagitis: Secondary | ICD-10-CM | POA: Insufficient documentation

## 2024-07-26 DIAGNOSIS — Z6835 Body mass index (BMI) 35.0-35.9, adult: Secondary | ICD-10-CM | POA: Insufficient documentation

## 2024-07-26 DIAGNOSIS — R918 Other nonspecific abnormal finding of lung field: Secondary | ICD-10-CM | POA: Insufficient documentation

## 2024-07-26 DIAGNOSIS — Z23 Encounter for immunization: Secondary | ICD-10-CM | POA: Insufficient documentation

## 2024-07-26 DIAGNOSIS — R053 Chronic cough: Secondary | ICD-10-CM | POA: Insufficient documentation

## 2024-07-27 ENCOUNTER — Ambulatory Visit (INDEPENDENT_AMBULATORY_CARE_PROVIDER_SITE_OTHER): Payer: Self-pay | Admitting: Internal Medicine

## 2024-07-27 DIAGNOSIS — J479 Bronchiectasis, uncomplicated: Secondary | ICD-10-CM

## 2024-07-27 DIAGNOSIS — K219 Gastro-esophageal reflux disease without esophagitis: Secondary | ICD-10-CM

## 2024-07-27 DIAGNOSIS — R918 Other nonspecific abnormal finding of lung field: Secondary | ICD-10-CM

## 2024-07-27 DIAGNOSIS — K449 Diaphragmatic hernia without obstruction or gangrene: Secondary | ICD-10-CM

## 2024-07-27 NOTE — Result Encounter Note (Signed)
 Interval CT chest reviewed.  Stable LLL ground glass nodule, unchanged from prior CT.  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  No new consolidation or opacity.  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  Next visit with me is on 10/01/2024.      Norleen Ruth, MD  Fellow, Pulmonary & Critical Care Medicine  Department of Medicine  Pager [phone]

## 2024-07-30 ENCOUNTER — Encounter (INDEPENDENT_AMBULATORY_CARE_PROVIDER_SITE_OTHER): Payer: Self-pay

## 2024-07-30 ENCOUNTER — Other Ambulatory Visit: Payer: Self-pay

## 2024-07-30 ENCOUNTER — Ambulatory Visit (INDEPENDENT_AMBULATORY_CARE_PROVIDER_SITE_OTHER)

## 2024-07-30 VITALS — BP 134/84 | HR 71 | Temp 98.0°F | Resp 19 | Ht 65.0 in | Wt 224.2 lb

## 2024-07-30 DIAGNOSIS — H5789 Other specified disorders of eye and adnexa: Secondary | ICD-10-CM

## 2024-07-30 MED ORDER — ERYTHROMYCIN 5 MG/GRAM (0.5 %) EYE OINTMENT
TOPICAL_OINTMENT | Freq: Four times a day (QID) | OPHTHALMIC | 0 refills | Status: DC
Start: 2024-07-30 — End: 2024-09-19

## 2024-07-30 NOTE — Progress Notes (Cosign Needed)
 [facility] URGENT CARE  Urgent Care Clinic, [facility]  [address]  [place], [place] [address]    PATIENT NAME:  [name]  MRN:  [id number]  DOB:  [birth year]  DATE OF SERVICE:  07/30/2024    History of Present Illness:  [name] is a 68 y.o. female who presents to Urgent Care today with chief complaint of:       Pt presents to Urgent Care with c/o bilateral eye irritation onset today. Pt sts she woke up experiencing bilateral eye pain and burning sensation with associated swelling. She also reports slightly blurred vision. Pt denies any known injury or trauma to her eyes. She notes she recently moved into a new apartment. Pt reports an associated itchy rash to her R thigh described as small bumps. Pt denies fever, cough, rhinorrhea, or sore throat.          Review Flowsheet  More data exists         07/19/2024   FUNCTIONAL HEALTH SCREENING   How often do you have a problem understanding what is told to you about your medical condition?  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    Chest pain 02/19/2016    Chronic pain     lower back    Depression     Dyspnea on exertion     GERD (gastroesophageal reflux disease)     controlled w/med    H/O hearing loss     b/l, no aids    Headache     Hearing loss     Heart murmur 1979    benign    History of coronary angioplasty with insertion of stent 05/09/2023    Hyperlipidemia LDL goal < 100 08/29/2013    denies    Migraine     MINOR CAD (coronary artery disease) 11/10/2012    no treatment other than cholesterol medications; follows regularly with K.Dalton, PA     Multinodular goiter     Muscle weakness     neck     Myocardial infarction 05/09/2023    04/18/23, sees [place] cards    Neck problem     spinal stenosis    Obesity     Peripheral edema     Rash     eczema    Rheumatic fever     in 1974 no complications    S/P thyroidectomy     Shortness of breath     DOE    Sleep apnea     Squamous cell carcinoma 02/06/2015    right side of neck    Thyroid  disorder     s/p part thyroidectomy    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    Tinnitus     Vaginal prolapse 2012    surgery improved significantly    Wears glasses      Past Surgical History:    Past Surgical History:   Procedure Laterality Date  Cataract extraction Right 12/07/2022    Chalazion excision Left 12/22/2021    Colonoscopy  01/06/2009    Gastroscopy  03/05/2010    Gastroscopy with biopsy  03/05/2010    Hx adenoidectomy      Hx ankle fracture tx  2007    Hx appendectomy      Hx cholecystectomy      Hx colonoscopy      Hx cystocele repair  09/24/2009    Hx hand surgery  2012    Hx heart catheterization      Hx hysterectomy      Hx oophorectomy      Hx parotidectomy       Hx partial thyroidectomy Right 04/05/2019    Hx tonsillectomy      Hx total vaginal hysterectomy  1979    Hx wisdom teeth extraction      Hx wrist fracture tx Left 2007    Parathyroid  gland surgery      Pars plana vitrectomy Right 11/02/2021    Pb revise ulnar nerve at elbow Left 1979    Pb upper gi endoscopy,biopsy  12/19/2007    Septoplasty       Allergies:  Allergies[1]  Medications:    Current Outpatient Medications   Medication Sig    acetaminophen  (TYLENOL ) 500 mg Oral Tablet Take 2 Tablets (1,000 mg total) by mouth Three times a day as needed for Pain    albuterol  sulfate (PROAIR  HFA) 90 mcg/actuation Inhalation oral inhaler Take 2 Puffs by inhalation Every 4 hours as needed    aspirin  81 mg Oral Tablet, Chewable Chew 1 Tablet (81 mg total) Every morning    baclofen  (LIORESAL ) 5 mg Oral Tablet Take 1 Tablet (5 mg total) by mouth Three times  a day    clobetasoL  (TEMOVATE ) 0.05 % Ointment Apply topically Twice daily    desvenlafaxine  (PRISTIQ ) 100 mg Oral Tablet Sustained Release 24 hr Take 1 Tablet (100 mg total) by mouth Daily    diclofenac  sodium (VOLTAREN ) 1 % Gel Apply topically Four times a day - before meals and bedtime    erythromycin  (ROMYCIN) 5 mg/gram (0.5 %) Ophthalmic Ointment Apply to both eye Four times a day for 7 days Instill 1/2 inch 3 times/day    famotidine  (PEPCID ) 40 mg Oral Tablet Take 1 Tablet (40 mg total) by mouth Twice daily    gabapentin  (NEURONTIN ) 800 mg Oral Tablet Take 1 Tablet (800 mg total) by mouth Twice daily Take 1 Tablet (800 mg total) by mouth Twice daily    ipratropium bromide  (ATROVENT ) 42 mcg (0.06 %) Nasal Spray, Non-Aerosol Administer 2 Sprays into affected nostril(s) Three times a day for 90 days    isosorbide  mononitrate (IMDUR ) 60 mg Oral Tablet Sustained Release 24 hr Take 1 Tablet (60 mg total) by mouth Every morning for 360 days    metoprolol  succinate (TOPROL -XL) 25 mg Oral Tablet Sustained Release 24 hr Take 1 Tablet (25 mg total) by mouth Daily (Patient taking differently: Take 1 Tablet (25 mg total) by mouth Every night)    mirabegron  (MYRBETRIQ ) 25 mg Oral Tablet Sustained Release 24 hr Take 1 Tablet (25 mg total) by mouth Daily Take 1 Tablet (25 mg total) by mouth Daily    nitroGLYCERIN  (NITROSTAT ) 0.4 mg Sublingual Tablet, Sublingual DISSOLVE 1 TABLET UNDER THE TONGUE AS NEEDED FOR CHEST PAIN EVERY 5 MINUTES UP TO 3 TIMES. IF NO RELIEF CALL 911.    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     Employer: NOT EMPLOYED     Comment: occasionally   Tobacco Use    Smoking status: Former     Current packs/day: 0.00     Average packs/day: 2.0 packs/day for 40.0 years (80.0 ttl pk-yrs)     Types: Cigarettes     Start date: 01/22/1965     Quit date: 01/22/2005     Years since quitting: 19.5    Smokeless tobacco: Never   Vaping Use    Vaping status: Never Used   Substance and Sexual Activity    Alcohol use: No     Alcohol/week: 0.0 standard drinks of alcohol    Drug use: No    Sexual activity: Yes     Partners: Male   Other Topics Concern    Abuse/Domestic Violence Not Asked    Breast Self Exam Not Asked    Caffeine Concern Not Asked    Calcium  intake adequate Not Asked    Computer Use Not Asked    Exercise Concern Not Asked    Helmet Use Not Asked    Seat Belt Not Asked    Special Diet Not Asked    Sunscreen used Not Asked    Uses Cane No    Uses walker No    Uses wheelchair No    Right hand dominant Yes    Left hand dominant No    Ambidextrous No    Ability to Walk 1 Flight of Steps without SOB/CP Yes    Routine Exercise No    Ability to Walk 2 Flight of Steps without SOB/CP No     Comment: SOB    Unable to Ambulate Not Asked    Total Care No    Ability To Do Own ADL's Yes    Uses Walker No    Other Activity Level Yes     Comment: housework     Uses Cane No    Drives Not Asked    Shift Work No    Unusual Sleep-Wake Schedule No   Social History Narrative    Right handed.      Social Determinants of Health     Financial Resource Strain: Not on file   Transportation Needs: Not on file   Social Connections: Not on file   Intimate Partner Violence: Not on file   Housing Stability: Not on file     Family History:  No significant family history.  Family History   Problem Relation Age of Onset    Diabetes Mother     High Cholesterol Mother     Hypertension (High  Blood Pressure) Mother     Coronary Artery Disease Mother 39        CABG    Congestive Heart Failure Father 30  died at 96    Coronary Artery Disease Father     Heart Attack Father     Heart Attack Sister         51    Kidney Disease Sister         ESRD    Thyroid  Disease Sister     Heart Attack Sister     Hypertension (High Blood Pressure) Sister     Hypertension (High Blood Pressure) Sister     Hypertension (High Blood Pressure) Brother     Stroke Paternal Grandmother     Bipolar Disorder Daughter     Cancer Paternal Aunt         Breast Ca    Breast Cancer Paternal Aunt     Cancer Paternal Aunt         Breast Ca    Breast Cancer Paternal Aunt     Cancer Paternal Aunt         Breast Ca    Breast Cancer Paternal Aunt     Cancer Paternal Aunt         Breast Ca    Breast Cancer Paternal Aunt     Cancer Paternal Aunt         Breast Ca    Breast Cancer Paternal Aunt     Leukemia Paternal Uncle     Bipolar Disorder Paternal Uncle         bone    Cancer Other 11        Breast Ca/Breast Ca    Breast Cancer Other      Review of Systems:  Review of Systems   Constitutional:  Negative for fever.   HENT:  Negative for rhinorrhea and sore throat.    Eyes:  Positive for pain (burning) and visual disturbance (blurred).        +swelling   Respiratory:  Negative for cough.    Skin:  Positive for rash (R thigh).     Physical Exam:  Vital signs:   Vitals:    07/30/24 1225   BP: 134/84   Pulse: 71   Resp: 19   Temp: 36.7 C (98 F)   TempSrc: Oral   SpO2: 93%   Weight: 102 kg (224 lb 3.3 oz)   Height: 1.651 m (5' 5)   BMI: 37.31         Body mass index is 37.31 kg/m. Facility age limit for growth %iles is 20 years.  No LMP recorded. Patient has had a hysterectomy.    Physical Exam  Vitals and nursing note reviewed.   Constitutional:       General: She is not in acute distress.     Appearance: Normal appearance. She is well-developed. She is not diaphoretic.   HENT:      Head: Normocephalic and atraumatic.      Right Ear:  External ear normal.      Left Ear: External ear normal.      Mouth/Throat:      Mouth: Mucous membranes are moist.      Pharynx: Oropharynx is clear. No posterior oropharyngeal erythema.   Eyes:      General: Lids are normal.         Right eye: No discharge.         Left eye: No discharge.      Extraocular Movements: Extraocular movements intact.      Conjunctiva/sclera: Conjunctivae normal.      Right eye: Right conjunctiva is not injected.  Left eye: Left conjunctiva is not injected.      Pupils: Pupils are equal, round, and reactive to light.   Cardiovascular:      Rate and Rhythm: Normal rate.   Pulmonary:      Effort: Pulmonary effort is normal. No respiratory distress.      Breath sounds: No stridor.   Musculoskeletal:         General: No tenderness. Normal range of motion.      Cervical back: Normal range of motion.   Skin:     General: Skin is warm and dry.      Comments: 2 red spots over L proximal thigh   Neurological:      Mental Status: She is alert and oriented to person, place, and time.   Psychiatric:         Mood and Affect: Mood normal.         Behavior: Behavior normal.        Data Reviewed:  Not applicable  No results found. However, due to the size of the patient record, not all encounters were searched. Please check Results Review for a complete set of results.    Course:  Condition at discharge:  Good     Differential Diagnosis:  allergic vs viral vs bacterial conjunctivitis    Assessment:   Assessment/Plan   1. Burning sensation of eye      Plan:    Orders Placed This Encounter    Refer to [facility]    erythromycin  [facility]) 5 mg/gram (0.5 %) Ophthalmic Ointment      - Sent in prescription(s) for erythromycin  ointment to patient's preferred pharmacy.  - Referred pt to [facility] Ophthalmology for further evaluation    Advised patient/patient's guardian(s) to go to the Emergency Department immediately for further work up, if any concerning symptoms.  If symptoms  are worsening or not improving, the patient should return to Urgent Care for further evaluation.  Plan was discussed and patient/patient's guardian(s) verbalized understanding.      I am scribing for, and in the presence of Dr. Tobie for services provided on 07/30/2024.  Lyle Belarus, Scribe      Sabra Tobie MD, 07/30/2024, 13:10  Attending Physician, Urgent Care & Student Health  Assistant Professor, Emergency Medicine   Department of Emergency Medicine   [place]  [facility]        [1]   Allergies  Allergen Reactions    Cymbalta  [Duloxetine ]      Rash on feet and hands    Blue Dye      Told to avoid due to patch test result by [facility]    Flavoring Agent  Other Adverse Reaction (Add comment)     Patient unsure of reaction. Found on skin test    Lipitor [Atorvastatin ] Myalgia     Muscle pain    Lisinopril  Other Adverse Reaction (Add comment)     Cough      Nickel      Blisters  Allergic to all kinds of metal     Codeine  Itching     RASH    Mobic  [Meloxicam ] Nausea/ Vomiting     Nervous and shakes     Naprosyn  [Naproxen ] Nausea/ Vomiting    Nsaids (Non-Steroidal Anti-Inflammatory Drug) Nausea/ Vomiting

## 2024-07-31 ENCOUNTER — Encounter (INDEPENDENT_AMBULATORY_CARE_PROVIDER_SITE_OTHER): Payer: Self-pay

## 2024-07-31 ENCOUNTER — Other Ambulatory Visit (HOSPITAL_COMMUNITY): Payer: Self-pay | Admitting: Student in an Organized Health Care Education/Training Program

## 2024-07-31 ENCOUNTER — Other Ambulatory Visit (HOSPITAL_BASED_OUTPATIENT_CLINIC_OR_DEPARTMENT_OTHER): Payer: Self-pay | Admitting: Physician Assistant

## 2024-07-31 ENCOUNTER — Other Ambulatory Visit (INDEPENDENT_AMBULATORY_CARE_PROVIDER_SITE_OTHER): Payer: Self-pay | Admitting: Internal Medicine

## 2024-07-31 DIAGNOSIS — J479 Bronchiectasis, uncomplicated: Secondary | ICD-10-CM

## 2024-07-31 DIAGNOSIS — R918 Other nonspecific abnormal finding of lung field: Secondary | ICD-10-CM

## 2024-07-31 DIAGNOSIS — Z23 Encounter for immunization: Secondary | ICD-10-CM

## 2024-07-31 DIAGNOSIS — I25119 Atherosclerotic heart disease of native coronary artery with unspecified angina pectoris: Secondary | ICD-10-CM

## 2024-07-31 DIAGNOSIS — K219 Gastro-esophageal reflux disease without esophagitis: Secondary | ICD-10-CM

## 2024-07-31 DIAGNOSIS — Z955 Presence of coronary angioplasty implant and graft: Secondary | ICD-10-CM

## 2024-07-31 DIAGNOSIS — Z6835 Body mass index (BMI) 35.0-35.9, adult: Secondary | ICD-10-CM

## 2024-07-31 DIAGNOSIS — R053 Chronic cough: Secondary | ICD-10-CM

## 2024-07-31 DIAGNOSIS — K449 Diaphragmatic hernia without obstruction or gangrene: Secondary | ICD-10-CM

## 2024-07-31 DIAGNOSIS — R079 Chest pain, unspecified: Secondary | ICD-10-CM

## 2024-07-31 NOTE — Nursing Note (Signed)
 Received request for documentation for Albuterol  from [facility]  Placed in provider folder for signature.

## 2024-07-31 NOTE — Telephone Encounter (Signed)
 Refill requested for the following medication(s). Last ordered:      desvenlafaxine  (PRISTIQ ) 100 mg Oral Tablet Sustained Release 24 hr Take 1 Tablet (100 mg total) by mouth Daily Dispense: 90 Tablet, Refills: 2 ordered  04/05/2024 Estelle Savant, MD [facility] - [place], [place] - [address]     Change in pharmacy requested    Last appointment: 04/05/24    Follow up:    10/04/24    Larraine Nail, RN  07/31/2024, 12:53

## 2024-08-08 ENCOUNTER — Encounter (HOSPITAL_COMMUNITY): Payer: Self-pay

## 2024-08-08 ENCOUNTER — Ambulatory Visit (HOSPITAL_COMMUNITY)
Admission: RE | Admit: 2024-08-08 | Discharge: 2024-08-08 | Disposition: A | Discharge status: Home / Self Care | Source: Ambulatory Visit

## 2024-08-08 ENCOUNTER — Encounter (INDEPENDENT_AMBULATORY_CARE_PROVIDER_SITE_OTHER): Payer: Self-pay | Admitting: Ophthalmology

## 2024-08-08 ENCOUNTER — Ambulatory Visit: Payer: Self-pay | Attending: Ophthalmology | Admitting: Ophthalmology

## 2024-08-08 ENCOUNTER — Other Ambulatory Visit: Payer: Self-pay

## 2024-08-08 DIAGNOSIS — Z09 Encounter for follow-up examination after completed treatment for conditions other than malignant neoplasm: Secondary | ICD-10-CM

## 2024-08-08 DIAGNOSIS — Z8669 Personal history of other diseases of the nervous system and sense organs: Secondary | ICD-10-CM

## 2024-08-08 DIAGNOSIS — Z961 Presence of intraocular lens: Secondary | ICD-10-CM | POA: Insufficient documentation

## 2024-08-08 DIAGNOSIS — Z9889 Other specified postprocedural states: Secondary | ICD-10-CM

## 2024-08-08 NOTE — Progress Notes (Addendum)
 OPHTHALMOLOGY, [facility]  [address]  [place] [address]  Operated by [facility]         Patient Name: [name]  MRN#: [id number]  Birthdate: 21-Jun-1956    Date of Service: 08/08/2024    Chief Complaint    Follow Up         Chelbie Jarnagin is a 68 y.o. female who presents today for evaluation/consultation of:  HPI    Pt is here for  1 year DFE OCT OU  States that VA is stable OU  Pt complains of eye pain/irritation about a week ago, states eyelashes are growing inward and was prescribed emycin  Denies new/abnormal FOL  Complains of new floater OD started yesterday but has since went away   Denies curtains/veils  Denies missing VA/distorted VA  Denies using gtts/ung        Last edited by Jackquline Birmingham, COA on 08/08/2024  3:04 PM.        ROS    Positive for: Eyes  Negative for: Constitutional, Gastrointestinal, Neurological, Skin, Genitourinary, Musculoskeletal, HENT, Endocrine, Cardiovascular, Respiratory, Psychiatric, Allergic/Imm, Heme/Lymph  Last edited by Jackquline Birmingham, COA on 08/08/2024  3:04 PM.         All other systems Negative    Birmingham Jackquline, COA  08/08/2024, 15:06      Base Eye Exam       Visual Acuity (Snellen - Linear)         Right Left    Dist cc 20/40 -1 20/25 +2    Dist ph cc 20/30 -2       Correction: Glasses              Tonometry (Tonopen, 3:11 PM)         Right Left    Pressure 12 15              Pupils         Shape APD    Right Round None    Left Round None              Visual Fields         Right Left     Full Full              Extraocular Movement         Right Left     Full Full              Neuro/Psych       Oriented x3: Yes    Mood/Affect: Normal              Dilation       Both eyes: 1.0% Mydriacyl , 2.5% Phenylephrine  @ 3:11 PM                  Slit Lamp and Fundus Exam       External Exam         Right Left    External Normal Normal              Slit Lamp Exam         Right Left    Lids/Lashes Mild capped meibomian glands. Mild capped  meibomian glands.    Conjunctiva/Sclera White and quiet White and quiet    Cornea Clear, Reduced TBUT Clear, Reduced TBUT    Anterior Chamber Deep and quiet Deep and quiet    Iris Round and reactive Round and reactive  Lens Posterior chamber intraocular lens Nuclear sclerosis    Vitreous Normal Normal              Fundus Exam         Right Left    Disc Normal Normal    C/D Ratio 0.45V/035H 0.35/0.35    Macula Flat and intact Flat and intact    Vessels Normal Normal                    MD Addition to HPI: Pt presents for 1 year f/u with a history of FTMH OD s/p PPV/FTMH repair OD (2023). Pt reports stable vision OU, and notes a new floater OD that began yesterday and has since resolved. In addition, pt reports experiencing some trichiasis last week, and states she was treated w/ Emycin ung.        ENCOUNTER DIAGNOSES     ICD-10-CM   1. 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 Pseudophakia, right eye  Z96.1     Orders Placed This Encounter   Procedures    OPH OCT BI       Ophthalmic Plan of Care:    Hx of FTMH OD  - s/p PPV/FTMH repair/SF6 OD on 11/02/21  - FTMH remains closed on OCT  - RD precautions discussed  - continue to monitor, f/u in 1 year   - advise immediate f/u with any new or worsening symptoms    Follow up:    I have asked Tarae Wooden to follow up 1 year DFE OCT OU          I have seen and examined the above patient. I discussed the above diagnoses listed in the assessment and the above ophthalmic plan of care with the patient and patient's family. All questions were answered. I reviewed and, when necessary, made changes to the technician/resident note, documented ophthalmology exam, chief complaint, history of present illness, allergies, review of systems, past medical, past surgical, family and social history. I personally reviewed and interpreted all testing and/or imaging performed at this visit and agree with the resident's or fellow's interpretation. Any  exceptions/additions are edited/noted in the relevant encounter fields.      I am scribing for, and in the presence of Dr. Zilphia for services provided on 08/08/2024.  Isabella Bovino, SCRIBE   Isabella Bovino, [place]  08/08/2024, 15:48      When a scribe documentaion is present , I attest that I personally performed the services described in this documentation, as scribed  in my presence, and it is both accurate  and complete.  When a resident or fellow documentation is present, I attest that I saw and examined the patient.  I reviewed the resident's note.  I agree with the findings and plan of care as documented in the resident's note.  Any exceptions/additions are edited/noted.  When performed, I also attest that I reviewed the image(s)  and testing(s)  and agree with the interpretation and report as documented by the resident/fellow.  Exceptions as noted.    Verlon Charlie Zilphia, MD

## 2024-08-09 ENCOUNTER — Encounter (HOSPITAL_COMMUNITY): Payer: Self-pay

## 2024-08-09 DIAGNOSIS — Z9889 Other specified postprocedural states: Secondary | ICD-10-CM

## 2024-08-09 DIAGNOSIS — N904 Leukoplakia of vulva: Secondary | ICD-10-CM

## 2024-08-09 DIAGNOSIS — Z09 Encounter for follow-up examination after completed treatment for conditions other than malignant neoplasm: Secondary | ICD-10-CM

## 2024-08-09 MED ORDER — PANTOPRAZOLE 40 MG TABLET,DELAYED RELEASE
40.0000 mg | DELAYED_RELEASE_TABLET | Freq: Two times a day (BID) | ORAL | 3 refills | Status: DC
Start: 2024-08-09 — End: 2024-08-13

## 2024-08-09 MED ORDER — CLOBETASOL 0.05 % TOPICAL OINTMENT
TOPICAL_OINTMENT | Freq: Two times a day (BID) | CUTANEOUS | 3 refills | Status: DC
Start: 2024-08-09 — End: 2024-08-13

## 2024-08-09 MED ORDER — CLOBETASOL 0.05 % TOPICAL OINTMENT
TOPICAL_OINTMENT | Freq: Two times a day (BID) | CUTANEOUS | 3 refills | Status: DC
Start: 2024-08-09 — End: 2024-08-09

## 2024-08-09 NOTE — Telephone Encounter (Signed)
 Regarding: Refill Request  ----- Message from Lynwood RAMAN sent at 08/08/2024 12:33 PM EDT -----  Copied From CRM #[id number] - [facility] (Pharmacy) called to request a prescription refill.          Disp Refills Start End   clobetasoL  (TEMOVATE ) 0.05 % Ointment 60 g 3 03/07/2024 -   Sig - Route: Apply topically Twice daily - Apply Topically   Sent to pharmacy as: clobetasoL  0.05 % topical ointment (TEMOVATE )   Class: E-Rx   Notes to Pharmacy: 1 g/application   Non-formulary Exception Code: RXHUB/No Formulary Info Available          Disp Refills Start End   pantoprazole  (PROTONIX ) 40 mg Oral Tablet, Delayed Release (E.C.) 180 Tablet 3 03/07/2024 -   Sig - Route: Take 1 Tablet (40 mg total) by mouth Twice daily - Oral   Sent to pharmacy as: pantoprazole  40 mg tablet,delayed release (PROTONIX )   Class: E-Rx   Non-formulary Exception Code: RXHUB/No Formulary Info Available       Preferred Pharmacy     [facility] [place] [facility] - [address] [place] - [address]    [address] [place] [address]    Phone: [phone] Fax: [phone]    Hours: Not open 24 hours

## 2024-08-09 NOTE — Addendum Note (Signed)
 Addended by: DALTON, Porsha Skilton on: 08/09/2024 07:49 PM     Modules accepted: Orders

## 2024-08-10 ENCOUNTER — Encounter (HOSPITAL_COMMUNITY): Payer: Self-pay

## 2024-08-10 ENCOUNTER — Encounter (INDEPENDENT_AMBULATORY_CARE_PROVIDER_SITE_OTHER): Payer: Self-pay

## 2024-08-10 NOTE — Nursing Note (Signed)
 Faxed signed prescription request for reconciliation to [facility] [phone]).  Fax confirmation received.

## 2024-08-12 ENCOUNTER — Other Ambulatory Visit (HOSPITAL_BASED_OUTPATIENT_CLINIC_OR_DEPARTMENT_OTHER): Payer: Self-pay | Admitting: Physician Assistant

## 2024-08-13 ENCOUNTER — Encounter (HOSPITAL_COMMUNITY): Payer: Self-pay

## 2024-08-13 ENCOUNTER — Other Ambulatory Visit: Payer: Self-pay

## 2024-08-13 ENCOUNTER — Other Ambulatory Visit (HOSPITAL_BASED_OUTPATIENT_CLINIC_OR_DEPARTMENT_OTHER): Payer: Self-pay | Admitting: Physician Assistant

## 2024-08-13 ENCOUNTER — Ambulatory Visit
Admission: RE | Admit: 2024-08-13 | Discharge: 2024-08-13 | Disposition: A | Discharge status: Home / Self Care | Source: Ambulatory Visit | Attending: ORTHOPEDIC, SPORTS MEDICINE | Admitting: ORTHOPEDIC, SPORTS MEDICINE

## 2024-08-13 DIAGNOSIS — R079 Chest pain, unspecified: Secondary | ICD-10-CM

## 2024-08-13 DIAGNOSIS — Z955 Presence of coronary angioplasty implant and graft: Secondary | ICD-10-CM

## 2024-08-13 DIAGNOSIS — M25562 Pain in left knee: Secondary | ICD-10-CM | POA: Insufficient documentation

## 2024-08-13 DIAGNOSIS — M62838 Other muscle spasm: Secondary | ICD-10-CM

## 2024-08-13 DIAGNOSIS — I25119 Atherosclerotic heart disease of native coronary artery with unspecified angina pectoris: Secondary | ICD-10-CM

## 2024-08-13 DIAGNOSIS — S8392XD Sprain of unspecified site of left knee, subsequent encounter: Secondary | ICD-10-CM | POA: Insufficient documentation

## 2024-08-13 DIAGNOSIS — M130 Polyarthritis, unspecified: Secondary | ICD-10-CM

## 2024-08-13 DIAGNOSIS — S83272A Complex tear of lateral meniscus, current injury, left knee, initial encounter: Secondary | ICD-10-CM

## 2024-08-13 DIAGNOSIS — G8929 Other chronic pain: Secondary | ICD-10-CM | POA: Insufficient documentation

## 2024-08-13 DIAGNOSIS — N904 Leukoplakia of vulva: Secondary | ICD-10-CM

## 2024-08-14 ENCOUNTER — Encounter (HOSPITAL_COMMUNITY): Payer: Self-pay

## 2024-08-14 MED ORDER — BACLOFEN 5 MG TABLET
5.0000 mg | ORAL_TABLET | Freq: Three times a day (TID) | ORAL | 3 refills | Status: AC
Start: 2024-08-14 — End: ?

## 2024-08-14 MED ORDER — ONDANSETRON HCL 4 MG TABLET
4.0000 mg | ORAL_TABLET | Freq: Three times a day (TID) | ORAL | 1 refills | Status: DC | PRN
Start: 2024-08-14 — End: 2024-09-19

## 2024-08-14 MED ORDER — PANTOPRAZOLE 40 MG TABLET,DELAYED RELEASE
40.0000 mg | DELAYED_RELEASE_TABLET | Freq: Two times a day (BID) | ORAL | 3 refills | Status: AC
Start: 2024-08-14 — End: ?

## 2024-08-14 MED ORDER — FAMOTIDINE 40 MG TABLET
40.0000 mg | ORAL_TABLET | Freq: Two times a day (BID) | ORAL | 3 refills | Status: AC
Start: 2024-08-14 — End: ?

## 2024-08-14 MED ORDER — ISOSORBIDE MONONITRATE ER 60 MG TABLET,EXTENDED RELEASE 24 HR
60.0000 mg | ORAL_TABLET | Freq: Every morning | ORAL | 3 refills | Status: AC
Start: 2024-08-14 — End: 2025-08-09

## 2024-08-14 MED ORDER — IPRATROPIUM BROMIDE 42 MCG (0.06 %) NASAL SPRAY
2.0000 | Freq: Three times a day (TID) | NASAL | 5 refills | Status: AC
Start: 2024-08-14 — End: 2024-11-12

## 2024-08-14 MED ORDER — GABAPENTIN 800 MG TABLET
800.0000 mg | ORAL_TABLET | Freq: Two times a day (BID) | ORAL | 1 refills | Status: AC
Start: 2024-08-14 — End: ?

## 2024-08-14 MED ORDER — CLOBETASOL 0.05 % TOPICAL OINTMENT
TOPICAL_OINTMENT | Freq: Two times a day (BID) | CUTANEOUS | 3 refills | Status: AC
Start: 2024-08-14 — End: ?

## 2024-08-14 MED ORDER — METOPROLOL SUCCINATE ER 25 MG TABLET,EXTENDED RELEASE 24 HR
25.0000 mg | ORAL_TABLET | Freq: Every day | ORAL | 3 refills | Status: AC
Start: 2024-08-14 — End: ?

## 2024-08-14 MED ORDER — DICLOFENAC 1 % TOPICAL GEL
Freq: Four times a day (QID) | CUTANEOUS | 2 refills | Status: AC
Start: 2024-08-14 — End: ?

## 2024-08-14 MED ORDER — ROSUVASTATIN 40 MG TABLET
40.0000 mg | ORAL_TABLET | Freq: Every evening | ORAL | 3 refills | Status: AC
Start: 2024-08-14 — End: ?

## 2024-08-14 MED ORDER — MIRABEGRON ER 25 MG TABLET,EXTENDED RELEASE 24 HR
25.0000 mg | ORAL_TABLET | Freq: Every day | ORAL | 1 refills | Status: AC
Start: 2024-08-14 — End: ?

## 2024-08-15 ENCOUNTER — Inpatient Hospital Stay (HOSPITAL_COMMUNITY): Admission: RE | Admit: 2024-08-15 | Discharge: 2024-08-15

## 2024-08-15 ENCOUNTER — Encounter (HOSPITAL_COMMUNITY): Payer: Self-pay

## 2024-08-17 ENCOUNTER — Other Ambulatory Visit: Payer: Self-pay

## 2024-08-17 ENCOUNTER — Encounter (HOSPITAL_BASED_OUTPATIENT_CLINIC_OR_DEPARTMENT_OTHER): Payer: Self-pay | Admitting: ORTHOPEDIC, SPORTS MEDICINE

## 2024-08-17 ENCOUNTER — Ambulatory Visit: Payer: Self-pay | Attending: ORTHOPEDIC, SPORTS MEDICINE | Admitting: ORTHOPEDIC, SPORTS MEDICINE

## 2024-08-17 VITALS — Temp 98.3°F | Ht 65.0 in | Wt 223.9 lb

## 2024-08-17 DIAGNOSIS — M1712 Unilateral primary osteoarthritis, left knee: Secondary | ICD-10-CM | POA: Insufficient documentation

## 2024-08-17 DIAGNOSIS — S83272D Complex tear of lateral meniscus, current injury, left knee, subsequent encounter: Secondary | ICD-10-CM | POA: Insufficient documentation

## 2024-08-17 NOTE — Progress Notes (Signed)
 Name: Kalan Rinn  MEDICAL RECORD [id number]  DATE OF BIRTH: [birth year]  DATE OF SERVICE: 08/17/2024    SUBJECTIVE:  [name] presents to clinic today for follow up of Knee Pain (bilateral) and MRI Results  She is following for the left knee.  Again, she has pain isolated to the lateral aspect of the knee.  It has been progressively worsening.  She rates her pain today as 7 out of 10 she initially denies catching or locking.  Later today, she has had her knee lock up on her a few times since I had seen her.  She previously had an injection for osteoarthritis back in June.  She had great results of this in the right knee, but the left knee only got a few days of relief and then the pain returned and has remained severe.  MRI is reviewed and discussed with the patient today.        MEDICATIONS  Current Outpatient Medications   Medication Sig    acetaminophen  (TYLENOL ) 500 mg Oral Tablet Take 2 Tablets (1,000 mg total) by mouth Three times a day as needed for Pain    albuterol  sulfate (PROVENTIL  OR VENTOLIN  OR PROAIR ) 90 mcg/actuation Inhalation oral inhaler Take 1-2 Puffs by inhalation Every 6 hours as needed for Other (wheezing, shortness of breath) (Patient taking differently: Take 1-2 Puffs by inhalation Every 6 hours as needed for Other (wheezing, shortness of breath) 1-2 times a week)    aspirin  81 mg Oral Tablet, Chewable Chew 1 Tablet (81 mg total) Every morning    baclofen  (LIORESAL ) 5 mg Oral Tablet Take 1 Tablet (5 mg total) by mouth Three times a day (Patient taking differently: Take 1 Tablet (5 mg total) by mouth Three times a day as needed)    clobetasoL  (TEMOVATE ) 0.05 % Ointment Apply topically Twice daily    desvenlafaxine  (PRISTIQ ) 100 mg Oral Tablet Sustained Release 24 hr Take 1 Tablet (100 mg total) by mouth Daily    diclofenac  sodium (VOLTAREN ) 1 % Gel Apply topically Four times a day - before meals and bedtime (Patient taking differently: Apply topically Four times a day as  needed)    famotidine  (PEPCID ) 40 mg Oral Tablet Take 1 Tablet (40 mg total) by mouth Twice daily    gabapentin  (NEURONTIN ) 800 mg Oral Tablet Take 1 Tablet (800 mg total) by mouth Twice daily Take 1 Tablet (800 mg total) by mouth Twice daily    ipratropium bromide  (ATROVENT ) 42 mcg (0.06 %) Nasal Spray, Non-Aerosol Administer 2 Sprays into affected nostril(s) Three times a day    isosorbide  mononitrate (IMDUR ) 60 mg Oral Tablet Sustained Release 24 hr Take 1 Tablet (60 mg total) by mouth Every morning    metoprolol  succinate (TOPROL -XL) 25 mg Oral Tablet Sustained Release 24 hr Take 1 Tablet (25 mg total) by mouth Daily    mirabegron  (MYRBETRIQ ) 25 mg Oral Tablet Sustained Release 24 hr Take 1 Tablet (25 mg total) by mouth Daily Take 1 Tablet (25 mg total) by mouth Daily    nitroGLYCERIN  (NITROSTAT ) 0.4 mg Sublingual Tablet, Sublingual DISSOLVE 1 TABLET UNDER THE TONGUE AS NEEDED FOR CHEST PAIN EVERY 5 MINUTES UP TO 3 TIMES. IF NO RELIEF CALL 911.    ondansetron  (ZOFRAN ) 4 mg Oral Tablet Take 1 Tablet (4 mg total) by mouth Every 8 hours as needed for Nausea/Vomiting    pantoprazole  (PROTONIX ) 40 mg Oral Tablet, Delayed Release (E.C.) Take 1 Tablet (40 mg total) by mouth Twice daily  rosuvastatin  (CRESTOR ) 40 mg Oral Tablet Take 1 Tablet (40 mg total) by mouth Every evening     ALLERGIES  Allergies[1]    PHYSICAL EXAM  Temp 36.8 C (98.3 F) (Thermal Scan)   Ht 1.651 m (5' 5)   Wt 102 kg (223 lb 14 oz)   BMI 37.26 kg/m        Left knee:  There is mild effusion. She is tender to palpation across the lateral joint line, a little bit more posteriorly than anteriorly.  She is minimally tender over the patella.  There is minimal medial knee tenderness.  The knee is ligamentously stable.  Modified McMurray maneuver increases pain throughout the lateral knee, somewhat difficult to localize.  Gait is antalgic.      STUDIES:MRI of the left knee dated August 13, 2024, is reviewed on PACS.  Per my read, there is  moderate degenerative arthritis with thinning of the articular cartilage over the lateral compartment.  There is subchondral cystic change noted at the posterior aspect of the lateral femoral condyle.  There is complex tearing of the posterior horn and body of the lateral meniscus.        ASSESSMENT:  Assessment/Plan   1. Complex tear of lateral meniscus of left knee as current injury, subsequent encounter    2. Primary osteoarthritis of left knee      PLAN:  At today's visit, we discussed treatment options.  She does have a combination of problems with arthritis and lateral meniscal tear.  Unfortunately has not responded well to conservative treatment to date with this with only very transient relief with injection therapy for the knee.  I think it is reasonable to consider arthroscopic treatment for the lateral meniscus, as she is having some continued intermittent mechanical symptoms and pain that is not responsive to anti-inflammatories and injection.  We briefly discussed the possibility of repeating injection with corticosteroid or HA.  She is interested in surgical options.  We requested a consultation for this.     If the patient has any questions or concerns, she can contact our office.      Morene Houseman, MD 08/17/2024, 11:24           [1]   Allergies  Allergen Reactions    Cymbalta  [Duloxetine ]      Rash on feet and hands    Blue Dye      Told to avoid due to patch test result by Elita LUNA    Flavoring Agent  Other Adverse Reaction (Add comment)     Patient unsure of reaction. Found on skin test    Lipitor [Atorvastatin ] Myalgia     Muscle pain    Lisinopril  Other Adverse Reaction (Add comment)     Cough      Nickel      Blisters  Allergic to all kinds of metal     Codeine  Itching     RASH    Mobic  [Meloxicam ] Nausea/ Vomiting     Nervous and shakes     Naprosyn  [Naproxen ] Nausea/ Vomiting    Nsaids (Non-Steroidal Anti-Inflammatory Drug) Nausea/ Vomiting

## 2024-08-28 ENCOUNTER — Ambulatory Visit (HOSPITAL_BASED_OUTPATIENT_CLINIC_OR_DEPARTMENT_OTHER): Payer: MEDICAID

## 2024-08-28 ENCOUNTER — Ambulatory Visit (HOSPITAL_COMMUNITY): Admitting: Gastroenterology

## 2024-08-28 ENCOUNTER — Ambulatory Visit (HOSPITAL_COMMUNITY): Payer: MEDICAID

## 2024-08-28 ENCOUNTER — Encounter (HOSPITAL_COMMUNITY)
Admission: RE | Disposition: A | Discharge status: Home / Self Care | Payer: Self-pay | Source: Ambulatory Visit | Attending: Gastroenterology

## 2024-08-28 ENCOUNTER — Ambulatory Visit
Admission: RE | Admit: 2024-08-28 | Discharge: 2024-08-28 | Disposition: A | Discharge status: Home / Self Care | Payer: MEDICAID | Source: Ambulatory Visit | Attending: Gastroenterology | Admitting: Gastroenterology

## 2024-08-28 ENCOUNTER — Encounter (HOSPITAL_COMMUNITY): Payer: Self-pay | Admitting: Gastroenterology

## 2024-08-28 ENCOUNTER — Other Ambulatory Visit: Payer: Self-pay

## 2024-08-28 DIAGNOSIS — K317 Polyp of stomach and duodenum: Secondary | ICD-10-CM

## 2024-08-28 DIAGNOSIS — K2289 Other specified disease of esophagus: Secondary | ICD-10-CM | POA: Insufficient documentation

## 2024-08-28 DIAGNOSIS — Q399 Congenital malformation of esophagus, unspecified: Secondary | ICD-10-CM

## 2024-08-28 SURGERY — GASTROSCOPY
Anesthesia: Monitor Anesthesia Care | Site: Mouth | Wound class: Clean Contaminated Wounds-The respiratory, GI, Genital, or urinary

## 2024-08-28 MED ORDER — SODIUM CHLORIDE 0.9% FLUSH BAG - 250 ML
INTRAVENOUS | Status: DC | PRN
Start: 2024-08-28 — End: 2024-08-28

## 2024-08-28 MED ORDER — PROPOFOL 10 MG/ML INTRAVENOUS EMULSION
INTRAVENOUS | Status: DC | PRN
Start: 2024-08-28 — End: 2024-08-28
  Administered 2024-08-28: 200 ug/kg/min via INTRAVENOUS
  Administered 2024-08-28: 0 ug/kg/min via INTRAVENOUS

## 2024-08-28 MED ORDER — LACTATED RINGERS INTRAVENOUS SOLUTION
INTRAVENOUS | Status: DC | PRN
Start: 2024-08-28 — End: 2024-08-28

## 2024-08-28 MED ORDER — SODIUM CHLORIDE 0.9 % (FLUSH) INJECTION SYRINGE
2.0000 mL | INJECTION | Freq: Three times a day (TID) | INTRAMUSCULAR | Status: DC
Start: 2024-08-28 — End: 2024-08-28

## 2024-08-28 MED ORDER — LACTATED RINGERS INTRAVENOUS SOLUTION
INTRAVENOUS | Status: DC
Start: 2024-08-28 — End: 2024-08-28

## 2024-08-28 MED ORDER — DEXTROSE 5% IN WATER (D5W) FLUSH BAG - 250 ML
INTRAVENOUS | Status: DC | PRN
Start: 2024-08-28 — End: 2024-08-28

## 2024-08-28 MED ORDER — PROPOFOL 10 MG/ML IV BOLUS
INJECTION | Freq: Once | INTRAVENOUS | Status: DC | PRN
Start: 2024-08-28 — End: 2024-08-28
  Administered 2024-08-28: 100 mg via INTRAVENOUS

## 2024-08-28 MED ORDER — SODIUM CHLORIDE 0.9 % (FLUSH) INJECTION SYRINGE
2.0000 mL | INJECTION | INTRAMUSCULAR | Status: DC | PRN
Start: 2024-08-28 — End: 2024-08-28

## 2024-08-28 MED ORDER — GLYCOPYRROLATE 0.2 MG/ML INJECTION SOLUTION
Freq: Once | INTRAMUSCULAR | Status: DC | PRN
Start: 2024-08-28 — End: 2024-08-28
  Administered 2024-08-28: .2 mg via INTRAVENOUS

## 2024-08-28 MED ORDER — LIDOCAINE (PF) 20 MG/ML (2 %) INJECTION SOLUTION
Freq: Once | INTRAMUSCULAR | Status: DC | PRN
Start: 2024-08-28 — End: 2024-08-28
  Administered 2024-08-28: 100 mg via INTRAVENOUS

## 2024-08-28 SURGICAL SUPPLY — 3 items
CATH PH MONITORING VERSAFLEX Z 6FR ZNIS+8R 8 RING DISP (MED SURG SUPPLIES) IMPLANT
FORCEPS BIOPSY NEEDLE 240CM 2.2MM RJ 4 2.8MM STD CPC STRL DISP ORNG (ENDOSCOPIC SUPPLIES) IMPLANT
KIT ENDOSCOPIC COMPLIANCE ENDOKIT ORCAPOD 4 1.1OZ 1.1OZ CLEAN ADAPTER (ENDOSCOPIC SUPPLIES) IMPLANT

## 2024-08-28 NOTE — Anesthesia Preprocedure Evaluation (Signed)
 ANESTHESIA PRE-OP  EVALUATION  Planned Procedure: GASTROSCOPY (Mouth)  PH PROBE (Mouth)  Review of Systems     Physical Assessment      Airway       Mallampati: II      Neck ROM: full  Mouth Opening: good.            Dental       Dentition intact             Pulmonary    Breath sounds clear to auscultation       Cardiovascular    Rhythm: regular  Rate: Normal       Other findings              Plan  ASA 3     Planned anesthesia type: MAC                         Intravenous induction     Anesthesia issues/risks discussed are: PONV, Aspiration, Intraoperative Awareness/ Recall and Post-op Cognitive Dysfunction.  Anesthetic plan and risks discussed with patient  signed consent obtained          Patient's NPO status is appropriate for Anesthesia.           Plan discussed with CRNA.

## 2024-08-28 NOTE — Anesthesia Transfer of Care (Signed)
 ANESTHESIA TRANSFER OF CARE   [name] is a 68 y.o. 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    Chest pain 02/19/2016    Depression     Dyspnea on exertion     GERD (gastroesophageal reflux disease)     controlled w/med    H/O hearing loss     b/l, no aids    Headache     Hearing loss     Heart murmur 1979    benign    History of coronary angioplasty with insertion of stent 05/09/2023    Hyperlipidemia LDL goal < 100 08/29/2013    denies    Migraine     MINOR CAD (coronary artery disease) 11/10/2012    no treatment other than cholesterol medications; follows regularly with K.Dalton, PA    Multinodular goiter     Muscle weakness     neck     Myocardial infarction 05/09/2023    04/18/23, sees [place] cards    Neck problem     Obesity     Peripheral edema     Rash     eczema    Rheumatic fever     in 1974 no complications    S/P thyroidectomy     Shortness of breath     DOE    Squamous cell carcinoma 02/06/2015    right side of neck    Thyroid  disorder     s/p part thyroidectomy    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    Tinnitus     Vaginal prolapse 2012    surgery improved significantly    Wears glasses       Allergy History as of 08/28/24       CODEINE          Noted Status Severity Type Reaction    12/10/15 0941 Pantojas, Chili  Active Low  Itching    Comments: RASH     09/19/13 1430 Celestia Palma Joy  Deleted Low  Itching    Comments: RASH     09/04/09 Almarie Landry Caldron  Active   Itching    Comments: RASH     10/10/07   Active       Comments: RASH               STRAWBERRY         Noted Status Severity Type Reaction     03/18/11 1634 Cathryne Numbers  Deleted       10/10/08 Kathren Darice HERO, MD  Deleted       10/10/07   Active                 MELOXICAM          Noted Status Severity Type Reaction    11/24/18 0857 Kisner, Warren Earnie, RN 06/26/09 Active Low  Nausea/ Vomiting    Comments: Nervous and shakes      12/10/15 0941 Pantojas, Tully 06/26/09 Active Low  Nausea/ Vomiting    06/26/09 Oreskovich, Janae Nacole 06/26/09 Active   Nausea/ Vomiting              NICKEL         Noted Status Severity Type Reaction    07/19/19 0959 Raj Olivia Longs, RN 07/24/12 Active  Topical     Comments: Blisters  Allergic to all kinds of metal      11/24/18 0856 Kisner, Warren Jansky, RN 07/24/12 Active  Topical     Comments: Blisters       07/24/12 1041 Kathren Darice HERO, MD 07/24/12 Active  Topical               BLUE DYE         Noted Status Severity Type Reaction    07/24/12 1041 Kathren Darice HERO, MD 07/24/12 Active  Topical     Comments: Told to avoid due to patch test result by Derm [place]               DULOXETINE          Noted Status Severity Type Reaction    07/24/12 1052 Kathren Darice HERO, MD 07/24/12 Active Medium Systemic     Comments: Rash on feet and hands               NAPROXEN          Noted Status Severity Type Reaction    11/01/12 0913 Neda Melnick, MA 11/01/12 Active Low  Nausea/ Vomiting              ATORVASTATIN          Noted Status Severity Type Reaction    06/19/18 2149 Lang Knee, RX STUDENT 01/27/15 Active  Side Effect Myalgia    Comments: Muscle pain     01/27/15 1035 Kathren Darice HERO, MD 01/27/15 Active  Side Effect     Comments: Muscle pain               FLAVORING AGENT         Noted Status Severity Type Reaction    07/19/24 0636 Tedi Niemann, RN 05/03/17 Active    Other Adverse Reaction (Add comment)    Comments: Patient unsure of reaction. Found on skin test     07/19/19 0958 Raj Olivia Longs, RN 05/03/17 Active    Other Adverse Reaction (Add comment)    Comments: Patient unsure of  reaction     04/03/18 1905 Lynae Nest, [place] 05/03/17 Active                 NSAIDS (NON-STEROIDAL ANTI-INFLAMMATORY DRUG)         Noted Status Severity Type Reaction    11/24/18 0857 Kisner, Warren Jansky, RN 05/03/17 Active Low  Nausea/ Vomiting    04/03/18 1905 Lynae Nest, MA 05/03/17 Active                 LISINOPRIL         Noted Status Severity Type Reaction    07/27/23 1339 Rosary Wilbert Kay, [place] 07/27/23 Active    Other Adverse Reaction (Add comment)    Comments: Cough                     I completed my transfer of care / handoff to the receiving personnel during which we discussed:  Access, Airway, All key/critical aspects of case discussed, Analgesia, Antibiotics, Expectation of post procedure, Fluids/Product, Gave opportunity for questions and acknowledgement of understanding, Labs and PMHx  Additional Info:To PACU, color pink on RA.                                     Last OR Temp: Temperature: 36.4 C (97.5 F)      Airway:* No LDAs found *  Blood pressure 124/71, pulse 82, temperature 36.4 C (97.5 F), resp. rate 20, height 1.651 m (5' 5), weight 103 kg (226 lb 10.1 oz), SpO2 96%, not currently breastfeeding.

## 2024-08-28 NOTE — Discharge Instructions (Signed)
 SURGICAL DISCHARGE INSTRUCTIONS     Dr. Durene Lawless, MD  performed your GASTROSCOPY, [facility] PROBE today at the [facility]    [facility]:  Monday through Friday from 6 a.m. - 7 p.m.: [phone]  Between 7 p.m. - 6 a.m., weekends and holidays:  Call [facility] at [phone] or [phone].    PLEASE SEE WRITTEN HANDOUTS AS DISCUSSED BY YOUR NURSE:      SIGNS AND SYMPTOMS OF A WOUND / INCISION INFECTION   Be sure to watch for the following:  Increase in redness or red streaks near or around the wound or incision.  Increase in pain that is intense or severe and cannot be relieved by the pain medication that your doctor has given you.  Increase in swelling that cannot be relieved by elevation of a body part, or by applying ice, if permitted.  Increase in drainage, or if yellow / green in color and smells bad. This could be on a dressing or a cast.  Increase in fever for longer than 24 hours, or an increase that is higher than 101 degrees Fahrenheit (normal body temperature is 98 degrees Fahrenheit). The incision may feel warm to the touch.    **CALL YOUR DOCTOR IF ONE OR MORE OF THESE SIGNS / SYMPTOMS SHOULD OCCUR.    ANESTHESIA INFORMATION   ANESTHESIA -- ADULT PATIENTS:  You have received intravenous sedation / general anesthesia, and you may feel drowsy and light-headed for several hours. You may even experience some forgetfulness of the procedure. DO NOT DRIVE A MOTOR VEHICLE or perform any activity requiring complete alertness or coordination until you feel fully awake in about 24-48 hours. Do not drink alcoholic beverages for at least 24 hours. Do not stay alone, you must have a responsible adult available to be with you. You may also experience a dry mouth or nausea for 24 hours. This is a normal side effect and will disappear as the effects of the medication wear off.    REMEMBER   If you experience any difficulty breathing, chest pain, bleeding that you feel is excessive,  persistent nausea or vomiting or for any other concerns:  Call your physician Dr. Durene at [phone] or [phone]. You may also ask to have the GI doctor on call paged. They are available to you 24 hours a day.    SPECIAL INSTRUCTIONS / COMMENTS   Instructions for Upper Endoscopy:  Patient Information:  No driving until tomorrow, You may experience a sore throat and gas or bloating for 24 hours, Use an antacid for mild discomfort, and Call in one week for biopsy results- Referring MD   REMEMBER TO:  Call your physician for any of the following symptoms:  1.) Persistent vomiting or vomiting bright red blood  2.) Fever (temperature greater than 100F)  3.) Any difficulty breathing.      FOLLOW-UP APPOINTMENTS   Please call patient services at [phone] or [phone] to schedule a date / time of return. They are open Monday - Friday from 7:30 am - 5:00 pm.

## 2024-08-28 NOTE — Anesthesia Postprocedure Evaluation (Signed)
 Anesthesia Post Op Evaluation    Patient: [name]  Procedure(s) with comments:  GASTROSCOPY - 4/28 no contact letter sent via mychart  4/23 lm ajb  4/22 lm ajb  PH PROBE - impedance    Last Vitals:Temperature: 36.2 C (97.2 F) (08/28/24 1345)  Heart Rate: 76 (08/28/24 1345)  BP (Non-Invasive): 131/64 (08/28/24 1345)  Respiratory Rate: 16 (08/28/24 1345)  SpO2: 91 % (08/28/24 1345)    No notable events documented.    Patient is sufficiently recovered from the effects of anesthesia to participate in the evaluation and has returned to their pre-procedure level.  Patient location during evaluation: PACU       Patient participation: complete - patient participated  Level of consciousness: awake and alert and responsive to verbal stimuli    Pain management: adequate  Airway patency: patent    Anesthetic complications: no  Cardiovascular status: acceptable  Respiratory status: acceptable  Hydration status: acceptable  Patient post-procedure temperature: Pt Normothermic   PONV Status: Absent

## 2024-08-28 NOTE — H&P (Signed)
 [facility]  GI Admission History and Physical      [name]   MRN:  [id number]  Date of Birth:  April 16, 1956    Date of Procedure:  08/28/2024    Chief Complaint: GERD    HPI: [name], [name] is a 68 y.o. year old female who presents today for EGD and Impedence pH probe- on PPI    This procedure is being done to evaluate GERD- on PPI    The patient denies abdominal pain, dysphagia, hematochezia, or melena.    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    Chest pain 02/19/2016    Depression     Dyspnea on exertion     GERD (gastroesophageal reflux disease)     controlled w/med    H/O hearing loss     b/l, no aids    Headache     Hearing loss     Heart murmur 1979    benign    History of coronary angioplasty with insertion of stent 05/09/2023    Hyperlipidemia LDL goal < 100 08/29/2013    denies    Migraine     MINOR CAD (coronary artery disease) 11/10/2012    no treatment other than cholesterol medications; follows regularly with K.Dalton, PA    Multinodular goiter     Muscle weakness     neck     Myocardial infarction 05/09/2023    04/18/23, sees [place] cards    Neck problem     Obesity     Peripheral edema     Rash     eczema    Rheumatic fever     in 1974 no complications    S/P thyroidectomy     Shortness of breath     DOE    Squamous cell carcinoma 02/06/2015    right side of neck    Thyroid  disorder     s/p part thyroidectomy    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    Tinnitus     Vaginal prolapse 2012    surgery improved significantly    Wears glasses            Allergies[1]    Medications Prior to Admission       Prescriptions    acetaminophen  (TYLENOL ) 500 mg Oral Tablet    Take 2 Tablets (1,000 mg total) by mouth Three times a day as needed for Pain    albuterol  sulfate (PROVENTIL  OR VENTOLIN  OR PROAIR ) 90 mcg/actuation Inhalation  oral inhaler    Take 1-2 Puffs by inhalation Every 6 hours as needed for Other (wheezing, shortness of breath)    Patient taking differently:  Take 1-2 Puffs by inhalation Every 6 hours as needed for Other (wheezing, shortness of breath) 1-2 times a week    aspirin  81 mg Oral Tablet, Chewable    Chew 1 Tablet (81 mg total) Every morning    baclofen  (LIORESAL ) 5 mg Oral Tablet    Take 1 Tablet (5 mg total) by mouth Three times a day    Patient taking differently:  Take 1 Tablet (5 mg total) by mouth Three times a day as needed    clobetasoL  (TEMOVATE ) 0.05 % Ointment    Apply topically Twice daily  desvenlafaxine  (PRISTIQ ) 100 mg Oral Tablet Sustained Release 24 hr    Take 1 Tablet (100 mg total) by mouth Daily    diclofenac  sodium (VOLTAREN ) 1 % Gel    Apply topically Four times a day - before meals and bedtime    Patient taking differently:  Apply topically Four times a day as needed    erythromycin  (ROMYCIN) 5 mg/gram (0.5 %) Ophthalmic Ointment    Apply to both eye Four times a day for 7 days Instill 1/2 inch 3 times/day    famotidine  (PEPCID ) 40 mg Oral Tablet    Take 1 Tablet (40 mg total) by mouth Twice daily    gabapentin  (NEURONTIN ) 800 mg Oral Tablet    Take 1 Tablet (800 mg total) by mouth Twice daily Take 1 Tablet (800 mg total) by mouth Twice daily    ipratropium bromide  (ATROVENT ) 42 mcg (0.06 %) Nasal Spray, Non-Aerosol    Administer 2 Sprays into affected nostril(s) Three times a day    isosorbide  mononitrate (IMDUR ) 60 mg Oral Tablet Sustained Release 24 hr    Take 1 Tablet (60 mg total) by mouth Every morning    metoprolol  succinate (TOPROL -XL) 25 mg Oral Tablet Sustained Release 24 hr    Take 1 Tablet (25 mg total) by mouth Daily    mirabegron  (MYRBETRIQ ) 25 mg Oral Tablet Sustained Release 24 hr    Take 1 Tablet (25 mg total) by mouth Daily Take 1 Tablet (25 mg total) by mouth Daily    nitroGLYCERIN  (NITROSTAT ) 0.4 mg Sublingual Tablet, Sublingual    DISSOLVE 1 TABLET UNDER THE TONGUE AS NEEDED  FOR CHEST PAIN EVERY 5 MINUTES UP TO 3 TIMES. IF NO RELIEF CALL 911.    ondansetron  (ZOFRAN ) 4 mg Oral Tablet    Take 1 Tablet (4 mg total) by mouth Every 8 hours as needed for Nausea/Vomiting    pantoprazole  (PROTONIX ) 40 mg Oral Tablet, Delayed Release (E.C.)    Take 1 Tablet (40 mg total) by mouth Twice daily    rosuvastatin  (CRESTOR ) 40 mg Oral Tablet    Take 1 Tablet (40 mg total) by mouth Every evening             Past Surgical History:   Procedure Laterality Date    CATARACT EXTRACTION Right 12/07/2022    CHALAZION EXCISION Left 12/22/2021    and conj lesion excision from caruncle    COLONOSCOPY  01/06/2009    COLONOSCOPY performed by KARIN, AHMED F at [facility] OR ENDO    GASTROSCOPY  03/05/2010    GASTROSCOPY performed by WAVERLY, SWATI at [facility] OR ENDO    GASTROSCOPY WITH BIOPSY  03/05/2010    GASTROSCOPY WITH BIOPSY performed by WAVERLY PLEAS at [facility] OR ENDO    HX ADENOIDECTOMY      HX ANKLE FRACTURE TX  2007    left distal fibula, casted    HX APPENDECTOMY      HX CERVICAL SPINE SURGERY      HX CHOLECYSTECTOMY      HX COLONOSCOPY      HX CYSTOCELE REPAIR  09/24/2009    HX HAND SURGERY  2012    for Carpal tunnel : right side treated    HX HEART CATHETERIZATION      HX HYSTERECTOMY      HX OOPHORECTOMY      left ovary removed    HX PAROTIDECTOMY       surgical clamp     HX PARTIAL THYROIDECTOMY Right 04/05/2019  R hemithyroidectomy for nodule, final path follicular adenoma    HX TONSILLECTOMY      HX TOTAL VAGINAL HYSTERECTOMY  1979    HX WISDOM TEETH EXTRACTION      HX WRIST FRACTURE TX Left 2007    FOOSH injury    PARATHYROID  GLAND SURGERY      PARS PLANA VITRECTOMY Right 11/02/2021    Pars plana vitrectomy, nternal limiting membrane (ILM) and epiretinal membrane (ERM) peel, Injection of 20%  sf6 gas, Subtenon's injection of Cefuroxime  50 mg and Dexamethasone  2 mg    PB REVISE ULNAR NERVE AT ELBOW Left 1979    PB UPPER GI ENDOSCOPY,BIOPSY  12/19/2007    patulous GE junction zone, erythema, nonerosive GERD     SEPTOPLASTY             Physical Exam:  Filed Vitals:    08/28/24 1107   BP: 137/81   Pulse: 77   Resp: 16   Temp: 36.3 C (97.3 F)   SpO2: 93%     HEENT: Atraumatic, normocephalic, moist mucous membranes  Heart: Regular rate and rhythm  Lungs: No respiratory distress, symmetric chest expansion  Abdomen: Soft, nondistended    Assessment:  GERD    Plan:  Proceed with EGD and Impedence pH probe- on PPI    Orders Placed This Encounter    REMOVE SALINE LOCK    INSERT & MAINTAIN PERIPHERAL IV ACCESS    PERIPHERAL IV DRESSING CHANGE    NS flush syringe    NS flush syringe    NS 250 mL flush bag    D5W 250 mL flush bag    LR premix infusion         Rehmat Laroy Amy, MD    To comply with the [place]  Joint Commission and Accreditation's mandate for medication reconciliation, the process of identifying the most accurate list of all medications the patient is taking was done by nursing staff through patient's self reported medication usage. While I attest to the medication reconciliation being done, my acknowledgement doesn't in anyway convey or affirm that I am the prescriber of these medications and can't attest to the safety, efficacy or interactions of these medications, and that responsibility rests solely with the prescribing physician.    Colon Krill, MD           [1]   Allergies  Allergen Reactions    Cymbalta  [Duloxetine ]      Rash on feet and hands    Blue Dye      Told to avoid due to patch test result by Elita LUNA    Flavoring Agent  Other Adverse Reaction (Add comment)     Patient unsure of reaction. Found on skin test    Lipitor [Atorvastatin ] Myalgia     Muscle pain    Lisinopril  Other Adverse Reaction (Add comment)     Cough      Nickel      Blisters  Allergic to all kinds of metal     Codeine  Itching     RASH    Mobic  [Meloxicam ] Nausea/ Vomiting     Nervous and shakes     Naprosyn  [Naproxen ] Nausea/ Vomiting    Nsaids (Non-Steroidal Anti-Inflammatory Drug) Nausea/ Vomiting

## 2024-08-29 DIAGNOSIS — K229 Disease of esophagus, unspecified: Secondary | ICD-10-CM

## 2024-08-29 LAB — SURGICAL PATHOLOGY SPECIMEN

## 2024-08-31 ENCOUNTER — Other Ambulatory Visit: Payer: Self-pay

## 2024-08-31 ENCOUNTER — Encounter (HOSPITAL_BASED_OUTPATIENT_CLINIC_OR_DEPARTMENT_OTHER): Payer: Self-pay | Admitting: ORTHOPEDIC, SPORTS MEDICINE

## 2024-08-31 ENCOUNTER — Ambulatory Visit: Payer: Self-pay | Attending: ORTHOPEDIC, SPORTS MEDICINE | Admitting: ORTHOPEDIC, SPORTS MEDICINE

## 2024-08-31 DIAGNOSIS — M23201 Derangement of unspecified lateral meniscus due to old tear or injury, left knee: Secondary | ICD-10-CM

## 2024-08-31 DIAGNOSIS — M1712 Unilateral primary osteoarthritis, left knee: Secondary | ICD-10-CM | POA: Insufficient documentation

## 2024-08-31 DIAGNOSIS — S83272D Complex tear of lateral meniscus, current injury, left knee, subsequent encounter: Secondary | ICD-10-CM | POA: Insufficient documentation

## 2024-09-06 ENCOUNTER — Ambulatory Visit (INDEPENDENT_AMBULATORY_CARE_PROVIDER_SITE_OTHER): Payer: Self-pay | Admitting: Gastroenterology

## 2024-09-06 DIAGNOSIS — K219 Gastro-esophageal reflux disease without esophagitis: Secondary | ICD-10-CM

## 2024-09-10 NOTE — H&P (Signed)
 [facility] MEDICINE  [facility]  [facility] MEDICINE DIVISION  [address]  [place] [address]  [phone]    NAME: [name]  MEDICAL RECORD [id number]  DATE OF BIRTH: [birth year]  DATE OF ENCOUNTER: 08/31/2024    CHIEF COMPLAINT:  Left Knee pain    HISTORY OF PRESENT ILLNESS:  [name] is a 68 y.o. female that presents to clinic today for evaluation of left Knee pain.  The patient states that she has had longstanding left knee pain without focal traumatic event.  She states it aches at rest, 8 with ambulation, gives her pain when she is getting up from standing.  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  She can not take anti-inflammatories due to other medical issues, as occasionally had to take Advil  for the pain.  Patient denies any radicular pain, numbness or tingling to the Left lower extremity.    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    Chest pain 02/19/2016    Depression     Dyspnea on exertion     GERD (gastroesophageal reflux disease)     controlled w/med    H/O hearing loss     b/l, no aids    Headache     Hearing loss     Heart murmur 1979    benign    History of coronary angioplasty with insertion of stent 05/09/2023    Hyperlipidemia LDL goal < 100 08/29/2013    denies    Migraine     MINOR CAD (coronary artery disease) 11/10/2012    no treatment other than cholesterol medications; follows regularly with K.Dalton, PA    Multinodular goiter     Muscle weakness     neck     Myocardial infarction 05/09/2023    04/18/23, sees [employer]    Neck problem     Obesity     Peripheral edema     Rash     eczema    Rheumatic fever     in 1974 no complications    S/P thyroidectomy     Shortness of breath     DOE     Squamous cell carcinoma 02/06/2015    right side of neck    Thyroid  disorder     s/p part thyroidectomy    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    Tinnitus     Vaginal prolapse 2012    surgery improved significantly    Wears glasses        PAST SURGICAL HISTORY  Past Surgical History:   Procedure Laterality Date    CATARACT EXTRACTION Right 12/07/2022    CHALAZION EXCISION Left 12/22/2021    and conj lesion excision from caruncle    COLONOSCOPY  01/06/2009    COLONOSCOPY performed by KARIN, AHMED F at [facility] OR ENDO    GASTROSCOPY  03/05/2010    GASTROSCOPY performed by WAVERLY, SWATI at [facility] OR ENDO    GASTROSCOPY WITH BIOPSY  03/05/2010    GASTROSCOPY WITH BIOPSY performed by WAVERLY PLEAS at [facility] OR ENDO    HX ADENOIDECTOMY      HX  ANKLE FRACTURE TX  2007    left distal fibula, casted    HX APPENDECTOMY      HX CERVICAL SPINE SURGERY      HX CHOLECYSTECTOMY      HX COLONOSCOPY      HX CYSTOCELE REPAIR  09/24/2009    HX HAND SURGERY  2012    for Carpal tunnel : right side treated    HX HEART CATHETERIZATION      HX HYSTERECTOMY      HX OOPHORECTOMY      left ovary removed    HX PAROTIDECTOMY       surgical clamp     HX PARTIAL THYROIDECTOMY Right 04/05/2019    R hemithyroidectomy for nodule, final path follicular adenoma    HX TONSILLECTOMY      HX TOTAL VAGINAL HYSTERECTOMY  1979    HX WISDOM TEETH EXTRACTION      HX WRIST FRACTURE TX Left 2007    FOOSH injury    PARATHYROID  GLAND SURGERY      PARS PLANA VITRECTOMY Right 11/02/2021    Pars plana vitrectomy, nternal limiting membrane (ILM) and epiretinal membrane (ERM) peel, Injection of 20%  sf6 gas, Subtenon's injection of Cefuroxime  50 mg and Dexamethasone  2 mg    PB REVISE ULNAR NERVE AT ELBOW Left 1979    PB UPPER GI ENDOSCOPY,BIOPSY  12/19/2007    patulous GE junction zone, erythema, nonerosive GERD    SEPTOPLASTY         BMI  Body mass index is 37.2 kg/m.  MEDICATIONS  Current Outpatient Medications   Medication Sig    acetaminophen  (TYLENOL ) 500 mg Oral  Tablet Take 2 Tablets (1,000 mg total) by mouth Three times a day as needed for Pain    albuterol  sulfate (PROVENTIL  OR VENTOLIN  OR PROAIR ) 90 mcg/actuation Inhalation oral inhaler Take 1-2 Puffs by inhalation Every 6 hours as needed for Other (wheezing, shortness of breath) (Patient taking differently: Take 1-2 Puffs by inhalation Every 6 hours as needed for Other (wheezing, shortness of breath) 1-2 times a week)    aspirin  81 mg Oral Tablet, Chewable Chew 1 Tablet (81 mg total) Every morning    baclofen  (LIORESAL ) 5 mg Oral Tablet Take 1 Tablet (5 mg total) by mouth Three times a day (Patient taking differently: Take 1 Tablet (5 mg total) by mouth Three times a day as needed)    clobetasoL  (TEMOVATE ) 0.05 % Ointment Apply topically Twice daily    desvenlafaxine  (PRISTIQ ) 100 mg Oral Tablet Sustained Release 24 hr Take 1 Tablet (100 mg total) by mouth Daily    diclofenac  sodium (VOLTAREN ) 1 % Gel Apply topically Four times a day - before meals and bedtime (Patient taking differently: Apply topically Four times a day as needed)    famotidine  (PEPCID ) 40 mg Oral Tablet Take 1 Tablet (40 mg total) by mouth Twice daily    gabapentin  (NEURONTIN ) 800 mg Oral Tablet Take 1 Tablet (800 mg total) by mouth Twice daily Take 1 Tablet (800 mg total) by mouth Twice daily    ipratropium bromide  (ATROVENT ) 42 mcg (0.06 %) Nasal Spray, Non-Aerosol Administer 2 Sprays into affected nostril(s) Three times a day    isosorbide  mononitrate (IMDUR ) 60 mg Oral Tablet Sustained Release 24 hr Take 1 Tablet (60 mg total) by mouth Every morning    metoprolol  succinate (TOPROL -XL) 25 mg Oral Tablet Sustained Release 24 hr Take 1 Tablet (25 mg total) by mouth Daily    mirabegron  (MYRBETRIQ ) 25 mg  Oral Tablet Sustained Release 24 hr Take 1 Tablet (25 mg total) by mouth Daily Take 1 Tablet (25 mg total) by mouth Daily    nitroGLYCERIN  (NITROSTAT ) 0.4 mg Sublingual Tablet, Sublingual DISSOLVE 1 TABLET UNDER THE TONGUE AS NEEDED FOR CHEST PAIN  EVERY 5 MINUTES UP TO 3 TIMES. IF NO RELIEF CALL 911.    ondansetron  (ZOFRAN ) 4 mg Oral Tablet Take 1 Tablet (4 mg total) by mouth Every 8 hours as needed for Nausea/Vomiting    pantoprazole  (PROTONIX ) 40 mg Oral Tablet, Delayed Release (E.C.) Take 1 Tablet (40 mg total) by mouth Twice daily    rosuvastatin  (CRESTOR ) 40 mg Oral Tablet Take 1 Tablet (40 mg total) by mouth Every evening     ALLERGIES  Allergies[1]  SOCIAL HISTORY  Social History[2]  FAMILY HISTORY  Family Medical History:       Problem Relation (Age of Onset)    Bipolar Disorder Daughter, Paternal Uncle    Breast Cancer Paternal Aunt, Paternal Aunt, Paternal Aunt, Paternal Aunt, Paternal Aunt, Other    Cancer Paternal Aunt, Paternal Aunt, Paternal Aunt, Paternal Aunt, Paternal Aunt, Other (31)    Congestive Heart Failure Father (50)    Coronary Artery Disease Mother (38), Father    Diabetes Mother    Heart Attack Father, Sister, Sister    High Cholesterol Mother    Hypertension (High Blood Pressure) Mother, Sister, Sister, Brother    Kidney Disease Sister    Leukemia Paternal Uncle    Stroke Paternal Grandmother    Thyroid  Disease Sister            No history of bleeding disorders or adverse reactions to anesthesia     REVIEW OF SYSTEMS  Musculoskeletal and neurologic systems reviewed per the HPI and all others negative except for those that are also listed on the intake form.    PHYSICAL EXAMTemp 36.5 C (97.7 F) (Thermal Scan)   Ht 1.66 m (5' 5.35)   Wt 102 kg (225 lb 15.5 oz)   BMI 37.20 kg/m       Patient is AAOx3, NAD, and compliant with the examination.  Patient has tenderness palpation over the lateral joint line.  She does have a valgus appearance to her limb.  Medial side does not show any tenderness palpation.  She has little bit of the flexion contracture 5 and she is able to get flexion to 130.  She does open up with varus stress.  Negative Lachman's.  She is able to raise the leg straight against resistance of gravity.  Otherwise  motor and sensory intact, distal pulses intact left lower extremity.    IMAGING (Per my own interpretation):  4-view of the bilateral knees shows evidence of some lateral compartment joint space narrowing without overt osteophytic formation.  The medial compartments appear normal.  MRI of the left knee shows predominantly lateral compartment disease with kissing lesions at the lateral edge of the tibia and femoral condyles with subchondral signal and chondromalacia.  There is more disease on the posterior aspect of the lateral femoral condyle.   There is evidence of for overall we discussed degenerative tearing without overt extrusion.  ACL and PCL are intact.  Medial compartment is largely spared with regards to chondromalacia and the meniscus appears intact.    ASSESSMENT: 68 y.o. year old female with lateral compartment degenerative joint disease, degenerative meniscus tear    PLAN:  We discussed with the patient the nature of her condition.  She has a associated pain,  some mechanical symptoms.  It is unclear whether arthroscopic partial medial meniscectomy and debridement will help her, but she does not appear to the significant enough from an arthroplasty standpoint.  We indicate that arthroscopic debridement may be of benefit to her, but it may not improve the condition.  At this point, she would like to proceed more conservative surgical option as opposed to pursuing joint replacement surgery.  We will fill out an operating room card and get the patient organized for a surgery date.  She does not appear to be  If the patient has any questions or concerns, they will contact our office.      Gorman [employer] MD  Assistant Professor  [place] [employer] Orthopaedics         [1]   Allergies  Allergen Reactions    Cymbalta  [Duloxetine ]      Rash on feet and hands    Blue Dye      Told to avoid due to patch test result by [facility]    Flavoring Agent  Other Adverse Reaction (Add comment)     Patient unsure of reaction.  Found on skin test    Lipitor [Atorvastatin ] Myalgia     Muscle pain    Lisinopril  Other Adverse Reaction (Add comment)     Cough      Nickel      Blisters  Allergic to all kinds of metal     Codeine  Itching     RASH    Mobic  [Meloxicam ] Nausea/ Vomiting     Nervous and shakes     Naprosyn  [Naproxen ] Nausea/ Vomiting    Nsaids (Non-Steroidal Anti-Inflammatory Drug) Nausea/ Vomiting   [2]   Social History  Tobacco Use    Smoking status: Former     Current packs/day: 0.00     Average packs/day: 2.0 packs/day for 40.0 years (80.0 ttl pk-yrs)     Types: Cigarettes     Start date: 01/22/1965     Quit date: 01/22/2005     Years since quitting: 19.6    Smokeless tobacco: Never   Vaping Use    Vaping status: Never Used   Substance Use Topics    Alcohol use: No     Alcohol/week: 0.0 standard drinks of alcohol    Drug use: No

## 2024-09-12 ENCOUNTER — Encounter (INDEPENDENT_AMBULATORY_CARE_PROVIDER_SITE_OTHER): Payer: Self-pay | Admitting: Internal Medicine

## 2024-09-12 NOTE — Nursing Note (Signed)
 Received fax from [facility] for medication reconciliation form. Placed in Dr. Dorien folder for review and signature.  [name]

## 2024-09-17 ENCOUNTER — Other Ambulatory Visit (HOSPITAL_BASED_OUTPATIENT_CLINIC_OR_DEPARTMENT_OTHER): Payer: Self-pay | Admitting: Physician Assistant

## 2024-09-18 ENCOUNTER — Emergency Department (HOSPITAL_COMMUNITY)

## 2024-09-18 ENCOUNTER — Other Ambulatory Visit: Payer: Self-pay

## 2024-09-18 ENCOUNTER — Encounter (HOSPITAL_COMMUNITY): Payer: Self-pay

## 2024-09-18 ENCOUNTER — Observation Stay
Admission: EM | Admit: 2024-09-18 | Discharge: 2024-09-19 | Disposition: A | Discharge status: Home / Self Care | Attending: Student in an Organized Health Care Education/Training Program | Admitting: Student in an Organized Health Care Education/Training Program

## 2024-09-18 DIAGNOSIS — I2510 Atherosclerotic heart disease of native coronary artery without angina pectoris: Secondary | ICD-10-CM | POA: Insufficient documentation

## 2024-09-18 DIAGNOSIS — I252 Old myocardial infarction: Secondary | ICD-10-CM | POA: Insufficient documentation

## 2024-09-18 DIAGNOSIS — R519 Headache, unspecified: Secondary | ICD-10-CM

## 2024-09-18 DIAGNOSIS — N3289 Other specified disorders of bladder: Secondary | ICD-10-CM

## 2024-09-18 DIAGNOSIS — Z87891 Personal history of nicotine dependence: Secondary | ICD-10-CM

## 2024-09-18 DIAGNOSIS — G8929 Other chronic pain: Secondary | ICD-10-CM

## 2024-09-18 DIAGNOSIS — Z955 Presence of coronary angioplasty implant and graft: Secondary | ICD-10-CM

## 2024-09-18 DIAGNOSIS — R7989 Other specified abnormal findings of blood chemistry: Secondary | ICD-10-CM | POA: Insufficient documentation

## 2024-09-18 DIAGNOSIS — K219 Gastro-esophageal reflux disease without esophagitis: Secondary | ICD-10-CM | POA: Insufficient documentation

## 2024-09-18 DIAGNOSIS — Z7982 Long term (current) use of aspirin: Secondary | ICD-10-CM

## 2024-09-18 DIAGNOSIS — R9431 Abnormal electrocardiogram [ECG] [EKG]: Secondary | ICD-10-CM | POA: Insufficient documentation

## 2024-09-18 DIAGNOSIS — Z79899 Other long term (current) drug therapy: Secondary | ICD-10-CM

## 2024-09-18 DIAGNOSIS — Z79891 Long term (current) use of opiate analgesic: Secondary | ICD-10-CM | POA: Insufficient documentation

## 2024-09-18 DIAGNOSIS — R0789 Other chest pain: Principal | ICD-10-CM | POA: Insufficient documentation

## 2024-09-18 DIAGNOSIS — I083 Combined rheumatic disorders of mitral, aortic and tricuspid valves: Secondary | ICD-10-CM | POA: Insufficient documentation

## 2024-09-18 DIAGNOSIS — R5383 Other fatigue: Secondary | ICD-10-CM

## 2024-09-18 DIAGNOSIS — E785 Hyperlipidemia, unspecified: Secondary | ICD-10-CM | POA: Insufficient documentation

## 2024-09-18 DIAGNOSIS — F39 Unspecified mood [affective] disorder: Secondary | ICD-10-CM | POA: Insufficient documentation

## 2024-09-18 DIAGNOSIS — K449 Diaphragmatic hernia without obstruction or gangrene: Secondary | ICD-10-CM | POA: Insufficient documentation

## 2024-09-18 DIAGNOSIS — R197 Diarrhea, unspecified: Secondary | ICD-10-CM | POA: Insufficient documentation

## 2024-09-18 DIAGNOSIS — R079 Chest pain, unspecified: Principal | ICD-10-CM | POA: Diagnosis present

## 2024-09-18 DIAGNOSIS — R0602 Shortness of breath: Secondary | ICD-10-CM

## 2024-09-18 LAB — CBC WITH DIFF
BASOPHIL #: <0.1 x10ˆ3/uL (ref <=0.20)
BASOPHIL #: <0.1 x10ˆ3/uL (ref <=0.20)
BASOPHIL %: 0.7 %
BASOPHIL %: 0.6 %
EOSINOPHIL #: <0.1 x10ˆ3/uL (ref <=0.50)
EOSINOPHIL #: <0.1 x10ˆ3/uL (ref <=0.50)
EOSINOPHIL %: 0 %
EOSINOPHIL %: 0.1 %
HCT: 40.5 % (ref 34.8–46.0)
HCT: 39.8 % (ref 34.8–46.0)
HGB: 13.5 g/dL (ref 11.5–16.0)
HGB: 13.4 g/dL (ref 11.5–16.0)
IMMATURE GRANULOCYTE #: <0.1 x10ˆ3/uL (ref <0.10)
IMMATURE GRANULOCYTE #: <0.1 x10ˆ3/uL (ref <0.10)
IMMATURE GRANULOCYTE %: 0.3 % (ref 0.0–1.0)
IMMATURE GRANULOCYTE %: 0.4 % (ref 0.0–1.0)
LYMPHOCYTE #: 1.96 x10ˆ3/uL (ref 1.00–4.80)
LYMPHOCYTE #: 2.18 x10ˆ3/uL (ref 1.00–4.80)
LYMPHOCYTE %: 29 %
LYMPHOCYTE %: 31 %
MCH: 29.2 pg (ref 26.0–32.0)
MCH: 29.5 pg (ref 26.0–32.0)
MCHC: 33.3 g/dL (ref 31.0–35.5)
MCHC: 33.7 g/dL (ref 31.0–35.5)
MCV: 87.7 fL (ref 78.0–100.0)
MCV: 87.7 fL (ref 78.0–100.0)
MONOCYTE #: 0.62 x10ˆ3/uL (ref 0.20–1.10)
MONOCYTE #: 0.55 x10ˆ3/uL (ref 0.20–1.10)
MONOCYTE %: 9.2 %
MONOCYTE %: 7.8 %
MPV: 8.8 fL (ref 8.7–12.5)
MPV: 9.1 fL (ref 8.7–12.5)
NEUTROPHIL #: 4.1 x10ˆ3/uL (ref 1.50–7.70)
NEUTROPHIL #: 4.22 x10ˆ3/uL (ref 1.50–7.70)
NEUTROPHIL %: 60.8 %
NEUTROPHIL %: 60.1 %
PLATELETS: 323 x10ˆ3/uL (ref 150–400)
PLATELETS: 334 x10ˆ3/uL (ref 150–400)
RBC: 4.62 x10ˆ6/uL (ref 3.85–5.22)
RBC: 4.54 x10ˆ6/uL (ref 3.85–5.22)
RDW-CV: 13.7 % (ref 11.5–15.5)
RDW-CV: 14 % (ref 11.5–15.5)
WBC: 6.8 x10ˆ3/uL (ref 3.7–11.0)
WBC: 7 x10ˆ3/uL (ref 3.7–11.0)

## 2024-09-18 LAB — BASIC METABOLIC PANEL
ANION GAP: 7 mmol/L (ref 4–13)
ANION GAP: 9 mmol/L (ref 4–13)
BUN/CREA RATIO: 9 (ref 6–22)
BUN/CREA RATIO: 9 (ref 6–22)
BUN: 10 mg/dL (ref 8–25)
BUN: 10 mg/dL (ref 8–25)
CALCIUM: 9.1 mg/dL (ref 8.6–10.3)
CALCIUM: 9 mg/dL (ref 8.6–10.3)
CHLORIDE: 109 mmol/L (ref 96–111)
CHLORIDE: 107 mmol/L (ref 96–111)
CO2 TOTAL: 23 mmol/L (ref 23–31)
CO2 TOTAL: 25 mmol/L (ref 23–31)
CREATININE: 1.07 mg/dL — ABNORMAL HIGH (ref 0.60–1.05)
CREATININE: 1.13 mg/dL — ABNORMAL HIGH (ref 0.60–1.05)
GLUCOSE: 85 mg/dL (ref 65–125)
GLUCOSE: 103 mg/dL (ref 65–125)
POTASSIUM: 4.3 mmol/L (ref 3.5–5.1)
POTASSIUM: 3.7 mmol/L (ref 3.5–5.1)
SODIUM: 139 mmol/L (ref 136–145)
SODIUM: 141 mmol/L (ref 136–145)
eGFRcr - FEMALE: 57 mL/min/1.73mˆ2 — ABNORMAL LOW (ref >=60)
eGFRcr - FEMALE: 53 mL/min/1.73mˆ2 — ABNORMAL LOW (ref >=60)

## 2024-09-18 LAB — BLOOD GAS W/ LACTATE REFLEX
%FIO2 (VENOUS): 21 %
BASE EXCESS: 1.5 mmol/L (ref 0.0–3.0)
BICARBONATE (VENOUS): 25.2 mmol/L (ref 22.0–29.0)
LACTATE: 1 mmol/L (ref <=1.9)
O2 SATURATION (VENOUS): 65.5 % (ref 40.0–85.0)
PCO2 (VENOUS): 43 mmHg (ref 41–51)
PH (VENOUS): 7.4 (ref 7.32–7.43)
PO2 (VENOUS): 34 mmHg (ref 35–50)

## 2024-09-18 LAB — COVID-19, FLU A/B, RSV RAPID BY PCR
INFLUENZA VIRUS TYPE A: NOT DETECTED
INFLUENZA VIRUS TYPE B: NOT DETECTED
RESPIRATORY SYNCTIAL VIRUS (RSV): NOT DETECTED
SARS-CoV-2: NOT DETECTED

## 2024-09-18 LAB — TROPONIN-I (FOR ED ONLY): TROPONIN-I HS: <2.7 ng/L (ref <=14.0)

## 2024-09-18 LAB — TROPONIN-I: TROPONIN-I HS: <2.7 ng/L (ref <=14.0)

## 2024-09-18 LAB — PHOSPHORUS: PHOSPHORUS: 3.5 mg/dL (ref 2.3–4.0)

## 2024-09-18 LAB — MAGNESIUM: MAGNESIUM: 2.2 mg/dL (ref 1.8–2.6)

## 2024-09-18 MED ORDER — ACETAMINOPHEN 1,000 MG/100 ML (10 MG/ML) INTRAVENOUS SOLUTION
1000.0000 mg | INTRAVENOUS | Status: DC
Start: 2024-09-18 — End: 2024-09-18

## 2024-09-18 MED ORDER — ASPIRIN 81 MG CHEWABLE TABLET
81.0000 mg | CHEWABLE_TABLET | Freq: Every morning | ORAL | Status: DC
Start: 2024-09-19 — End: 2024-09-19
  Administered 2024-09-19: 81 mg via ORAL
  Filled 2024-09-18: qty 1

## 2024-09-18 MED ORDER — ACETAMINOPHEN 325 MG TABLET
650.0000 mg | ORAL_TABLET | ORAL | Status: DC | PRN
Start: 2024-09-18 — End: 2024-09-19

## 2024-09-18 MED ORDER — ONDANSETRON HCL 4 MG TABLET
4.0000 mg | ORAL_TABLET | Freq: Three times a day (TID) | ORAL | Status: DC | PRN
Start: 2024-09-18 — End: 2024-09-19

## 2024-09-18 MED ORDER — ENOXAPARIN 40 MG/0.4 ML SUBCUTANEOUS SYRINGE
40.0000 mg | INJECTION | SUBCUTANEOUS | Status: DC
Start: 2024-09-18 — End: 2024-09-19
  Administered 2024-09-18: 40 mg via SUBCUTANEOUS
  Filled 2024-09-18: qty 0.4

## 2024-09-18 MED ORDER — MIRABEGRON ER 25 MG TABLET,EXTENDED RELEASE 24 HR
25.0000 mg | ORAL_TABLET | Freq: Every day | ORAL | Status: DC
Start: 2024-09-19 — End: 2024-09-19
  Administered 2024-09-19: 25 mg via ORAL
  Filled 2024-09-18: qty 1

## 2024-09-18 MED ORDER — ISOSORBIDE MONONITRATE ER 60 MG TABLET,EXTENDED RELEASE 24 HR
60.0000 mg | ORAL_TABLET | Freq: Every morning | ORAL | Status: DC
Start: 2024-09-19 — End: 2024-09-19
  Administered 2024-09-19: 60 mg via ORAL
  Filled 2024-09-18: qty 1

## 2024-09-18 MED ORDER — METOPROLOL SUCCINATE ER 25 MG TABLET,EXTENDED RELEASE 24 HR
25.0000 mg | ORAL_TABLET | ORAL | Status: DC
Start: 2024-09-18 — End: 2024-09-19
  Administered 2024-09-18: 25 mg via ORAL
  Filled 2024-09-18: qty 1

## 2024-09-18 MED ORDER — SODIUM CHLORIDE 0.9 % (FLUSH) INJECTION SYRINGE
2.0000 mL | INJECTION | Freq: Three times a day (TID) | INTRAMUSCULAR | Status: DC
Start: 2024-09-18 — End: 2024-09-19
  Administered 2024-09-18: 0 mL
  Administered 2024-09-19: 2 mL
  Administered 2024-09-19: 5 mL

## 2024-09-18 MED ORDER — VENLAFAXINE ER 150 MG CAPSULE,EXTENDED RELEASE 24 HR
150.0000 mg | ORAL_CAPSULE | Freq: Every morning | ORAL | Status: DC
Start: 2024-09-19 — End: 2024-09-19
  Administered 2024-09-19: 150 mg via ORAL
  Filled 2024-09-18: qty 1

## 2024-09-18 MED ORDER — SODIUM CHLORIDE 0.65 % NASAL SPRAY AEROSOL
2.0000 | INHALATION_SPRAY | Freq: Three times a day (TID) | NASAL | Status: DC
Start: 2024-09-18 — End: 2024-09-19
  Administered 2024-09-18: 2 via NASAL
  Administered 2024-09-19: 0 via NASAL
  Administered 2024-09-19: 2 via NASAL
  Filled 2024-09-18 (×2): qty 44

## 2024-09-18 MED ORDER — IPRATROPIUM BROMIDE 42 MCG (0.06 %) NASAL SPRAY
2.0000 | Freq: Three times a day (TID) | NASAL | Status: DC
Start: 2024-09-18 — End: 2024-09-18

## 2024-09-18 MED ORDER — MORPHINE 4 MG/ML INTRAVENOUS SOLUTION
4.0000 mg | INTRAVENOUS | Status: AC
Start: 2024-09-18 — End: 2024-09-18

## 2024-09-18 MED ORDER — PANTOPRAZOLE 40 MG TABLET,DELAYED RELEASE
40.0000 mg | DELAYED_RELEASE_TABLET | Freq: Two times a day (BID) | ORAL | Status: DC
Start: 2024-09-18 — End: 2024-09-19
  Administered 2024-09-18 – 2024-09-19 (×2): 40 mg via ORAL
  Filled 2024-09-18 (×2): qty 1

## 2024-09-18 MED ORDER — MORPHINE 4 MG/ML INTRAVENOUS SOLUTION
INTRAVENOUS | Status: AC
Start: 2024-09-18 — End: 2024-09-18
  Administered 2024-09-18: 4 mg via INTRAVENOUS
  Filled 2024-09-18: qty 1

## 2024-09-18 MED ORDER — GABAPENTIN 400 MG CAPSULE
800.0000 mg | ORAL_CAPSULE | Freq: Two times a day (BID) | ORAL | Status: DC
Start: 2024-09-18 — End: 2024-09-19
  Administered 2024-09-18 – 2024-09-19 (×2): 800 mg via ORAL
  Filled 2024-09-18 (×2): qty 2

## 2024-09-18 MED ORDER — SODIUM CHLORIDE 0.9 % (FLUSH) INJECTION SYRINGE
2.0000 mL | INJECTION | INTRAMUSCULAR | Status: DC | PRN
Start: 2024-09-18 — End: 2024-09-19

## 2024-09-18 MED ORDER — SODIUM CHLORIDE 0.9 % INJECTION SOLUTION
2.0000 mL | INTRAVENOUS | Status: AC
Start: 2024-09-18 — End: 2024-09-19
  Administered 2024-09-19: 2 mL via INTRAVENOUS

## 2024-09-18 MED ORDER — DEXTROSE 5% IN WATER (D5W) FLUSH BAG - 250 ML
INTRAVENOUS | Status: DC | PRN
Start: 2024-09-18 — End: 2024-09-19

## 2024-09-18 MED ORDER — FAMOTIDINE 20 MG TABLET
40.0000 mg | ORAL_TABLET | Freq: Two times a day (BID) | ORAL | Status: DC
Start: 2024-09-18 — End: 2024-09-19
  Administered 2024-09-18 – 2024-09-19 (×2): 40 mg via ORAL
  Filled 2024-09-18 (×2): qty 2

## 2024-09-18 MED ORDER — SODIUM CHLORIDE 0.9% FLUSH BAG - 250 ML
INTRAVENOUS | Status: DC | PRN
Start: 2024-09-18 — End: 2024-09-19

## 2024-09-18 MED ORDER — MORPHINE 4 MG/ML INTRAVENOUS SOLUTION
4.0000 mg | INTRAVENOUS | Status: AC
Start: 2024-09-18 — End: 2024-09-18
  Administered 2024-09-18: 4 mg via INTRAVENOUS
  Filled 2024-09-18: qty 1

## 2024-09-18 MED ORDER — IOPAMIDOL 370 MG IODINE/ML (76 %) INTRAVENOUS SOLUTION
70.0000 mL | INTRAVENOUS | Status: AC
Start: 2024-09-18 — End: 2024-09-18
  Administered 2024-09-18: 70 mL via INTRAVENOUS

## 2024-09-18 MED ORDER — ASPIRIN 81 MG CHEWABLE TABLET
324.0000 mg | CHEWABLE_TABLET | ORAL | Status: AC
Start: 2024-09-18 — End: 2024-09-18
  Administered 2024-09-18: 324 mg via ORAL
  Filled 2024-09-18: qty 4

## 2024-09-18 MED ORDER — ROSUVASTATIN 20 MG TABLET
40.0000 mg | ORAL_TABLET | Freq: Every evening | ORAL | Status: DC
Start: 2024-09-18 — End: 2024-09-19
  Administered 2024-09-18: 40 mg via ORAL
  Filled 2024-09-18: qty 2

## 2024-09-18 NOTE — ED Nurses Note (Signed)
 Report called to nurse on 9SE. No further questions at this time. Patient taken to unit by ED tech at this time

## 2024-09-18 NOTE — H&P (Signed)
 Department of Family Medicine  Inpatient History and Physical    Barbara, Gardner  MRN: [id number]  Date of Birth:  04-04-1956    PCP: Rayfield Barrack, PA-C     Date of Admission: 09/18/2024     CHIEF COMPLAINT:  chest pain    HISTORY OF PRESENT ILLNESS   [name] is a 68 y.o. female with past medical history of coronary artery disease with myocardial infarction x1 s/p stents x3 gastroesophageal reflux disease, chronic pain, bladder irritability, mood disorder.     Patient presents with CP started at 0800 this morning. 8/10 at worst, 5/10 currently.  Patient recent history significant for diarrhea which started 2 nights ago and mild cough.  Chest pain started this morning and has persisted throughout the day.  Patient also has chest pressure, she says this sensation is similar to her previous MI in the past.  Patient also was told she was cold and clammy by her friend who witnessed the event.    ED work-up shows trop less than 2.7, EKG unremarkable, CXR within normal limits, CT PE showing no pulmonary embolism, subpleural ground-glass left lower lobe focus which is decreased in size, CT brain showing no acute intracranial process.  Baseline lab work otherwise unremarkable. HEART score calculated, 5 for primary team, 6 per emergency department staff.  Patient received 324 mg aspirin  load and 1 dose of IV morphine .  Patient called for admission at this time for chest pain rule out.      Allergies[1]  Medications Prior to Admission       Prescriptions    acetaminophen  (TYLENOL ) 500 mg Oral Tablet    Take 2 Tablets (1,000 mg total) by mouth Three times a day as needed for Pain    albuterol  sulfate (PROVENTIL  OR VENTOLIN  OR PROAIR ) 90 mcg/actuation Inhalation oral inhaler    Take 1-2 Puffs by inhalation Every 6 hours as needed for Other (wheezing, shortness of breath)    Patient taking differently:  Take 1-2 Puffs by inhalation Every 6 hours as needed for Other (wheezing, shortness of breath) 1-2 times a week     aspirin  81 mg Oral Tablet, Chewable    Chew 1 Tablet (81 mg total) Every morning    baclofen  (LIORESAL ) 5 mg Oral Tablet    Take 1 Tablet (5 mg total) by mouth Three times a day    Patient taking differently:  Take 1 Tablet (5 mg total) by mouth Three times a day as needed    clobetasoL  (TEMOVATE ) 0.05 % Ointment    Apply topically Twice daily    desvenlafaxine  (PRISTIQ ) 100 mg Oral Tablet Sustained Release 24 hr    Take 1 Tablet (100 mg total) by mouth Daily    diclofenac  sodium (VOLTAREN ) 1 % Gel    Apply topically Four times a day - before meals and bedtime    Patient taking differently:  Apply topically Four times a day as needed    erythromycin  (ROMYCIN) 5 mg/gram (0.5 %) Ophthalmic Ointment    Apply to both eye Four times a day for 7 days Instill 1/2 inch 3 times/day    famotidine  (PEPCID ) 40 mg Oral Tablet    Take 1 Tablet (40 mg total) by mouth Twice daily    gabapentin  (NEURONTIN ) 800 mg Oral Tablet    Take 1 Tablet (800 mg total) by mouth Twice daily Take 1 Tablet (800 mg total) by mouth Twice daily    ipratropium bromide  (ATROVENT ) 42 mcg (0.06 %) Nasal  Spray, Non-Aerosol    Administer 2 Sprays into affected nostril(s) Three times a day    isosorbide  mononitrate (IMDUR ) 60 mg Oral Tablet Sustained Release 24 hr    Take 1 Tablet (60 mg total) by mouth Every morning    metoprolol  succinate (TOPROL -XL) 25 mg Oral Tablet Sustained Release 24 hr    Take 1 Tablet (25 mg total) by mouth Daily    mirabegron  (MYRBETRIQ ) 25 mg Oral Tablet Sustained Release 24 hr    Take 1 Tablet (25 mg total) by mouth Daily Take 1 Tablet (25 mg total) by mouth Daily    nitroGLYCERIN  (NITROSTAT ) 0.4 mg Sublingual Tablet, Sublingual    DISSOLVE 1 TABLET UNDER THE TONGUE AS NEEDED FOR CHEST PAIN EVERY 5 MINUTES UP TO 3 TIMES. IF NO RELIEF CALL 911.    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    Chest pain 02/19/2016    Depression     Dyspnea on exertion     GERD (gastroesophageal reflux disease)     controlled w/med    H/O hearing loss     b/l, no aids    Headache     Hearing loss     Heart murmur 1979    benign    History of coronary angioplasty with insertion of stent 05/09/2023    Hyperlipidemia LDL goal < 100 08/29/2013    denies    Migraine     MINOR CAD (coronary artery disease) 11/10/2012    no treatment other than cholesterol medications; follows regularly with K.Dalton, PA    Multinodular goiter     Muscle weakness     neck     Myocardial infarction 05/09/2023    04/18/23, sees [place] cards    Neck problem     Obesity     Peripheral edema     Rash     eczema    Rheumatic fever     in 1974 no complications    S/P thyroidectomy     Shortness of breath     DOE    Squamous cell carcinoma 02/06/2015    right side of neck    Thyroid  disorder     s/p part thyroidectomy    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    Tinnitus     Vaginal prolapse 2012    surgery improved significantly    Wears glasses          Past Surgical History:   Procedure Laterality Date    CATARACT EXTRACTION Right 12/07/2022    CHALAZION EXCISION Left 12/22/2021    and conj lesion excision from caruncle    COLONOSCOPY  01/06/2009    COLONOSCOPY performed by KARIN, AHMED F at [facility] OR ENDO    GASTROSCOPY  03/05/2010    GASTROSCOPY  performed by WAVERLY PLEAS at [facility] OR ENDO    GASTROSCOPY WITH BIOPSY  03/05/2010    GASTROSCOPY WITH BIOPSY performed by WAVERLY PLEAS at [facility] OR ENDO    HX ADENOIDECTOMY      HX ANKLE FRACTURE TX  2007    left distal fibula, casted    HX APPENDECTOMY       HX CERVICAL SPINE SURGERY      HX CHOLECYSTECTOMY      HX COLONOSCOPY      HX CYSTOCELE REPAIR  09/24/2009    HX HAND SURGERY  2012    for Carpal tunnel : right side treated    HX HEART CATHETERIZATION      HX HYSTERECTOMY      HX OOPHORECTOMY      left ovary removed    HX PAROTIDECTOMY       surgical clamp     HX PARTIAL THYROIDECTOMY Right 04/05/2019    R hemithyroidectomy for nodule, final path follicular adenoma    HX TONSILLECTOMY      HX TOTAL VAGINAL HYSTERECTOMY  1979    HX WISDOM TEETH EXTRACTION      HX WRIST FRACTURE TX Left 2007    FOOSH injury    PARATHYROID  GLAND SURGERY      PARS PLANA VITRECTOMY Right 11/02/2021    Pars plana vitrectomy, nternal limiting membrane (ILM) and epiretinal membrane (ERM) peel, Injection of 20%  sf6 gas, Subtenon's injection of Cefuroxime  50 mg and Dexamethasone  2 mg    PB REVISE ULNAR NERVE AT ELBOW Left 1979    PB UPPER GI ENDOSCOPY,BIOPSY  12/19/2007    patulous GE junction zone, erythema, nonerosive GERD    SEPTOPLASTY           Family Medical History:       Problem Relation (Age of Onset)    Bipolar Disorder Daughter, Paternal Uncle    Breast Cancer Paternal Aunt, Paternal Aunt, Paternal Aunt, Paternal Aunt, Paternal Aunt, Other    Cancer Paternal Aunt, Paternal Aunt, Paternal Aunt, Paternal Aunt, Paternal Aunt, Other (64)    Congestive Heart Failure Father (50)    Coronary Artery Disease Mother (6), Father    Diabetes Mother    Heart Attack Father, Sister, Sister    High Cholesterol Mother    Hypertension (High Blood Pressure) Mother, Sister, Sister, Brother    Kidney Disease Sister    Leukemia Paternal Uncle    Stroke Paternal Grandmother    Thyroid  Disease Sister            Social History[2]   --------------------------------------------------------------------------------------  REVIEW OF SYSTEMS: As discussed above in the HPI; otherwise all other systems negative.    PHYSICAL EXAM  Temperature: 37 C (98.6 F)  Heart Rate: 89  BP (Non-Invasive):  107/82  Respiratory Rate: 18  SpO2: 96 %  General: flushed and moderate distress  Eyes: Conjunctiva clear.  HENT:Head atraumatic and normocephalic  Neck: no thyromegaly or lymphadenopathy  Lungs: Clear to auscultation and percussion bilaterally.   Cardiovascular: regular rate and rhythm, S1, S2 normal, no murmur, click, rub or gallop  Abdomen: non-distended  Extremities: No cyanosis or deformity  Skin: Skin warm and dry and new onset RLE erythematous papules which are painful  Neurologic: Grossly normal. Alert and oriented x3  Lymphatics: No lymphadenopathy  Psychiatric: Normal    LABS  Lab Results Today:    Results for orders placed or performed during the hospital encounter of 09/18/24 (from the past 24 hours)   ECG  12-LEAD   Result Value Ref Range    Ventricular rate 96 BPM    Atrial Rate 96 BPM    PR Interval 158 ms    QRS Duration 76 ms    QT Interval 340 ms    QTC Calculation 429 ms    Calculated P Axis 26 degrees    Calculated R Axis 10 degrees    Calculated T Axis -8 degrees   BASIC METABOLIC PANEL   Result Value Ref Range    SODIUM 139 136 - 145 mmol/L    POTASSIUM 4.3 3.5 - 5.1 mmol/L    CHLORIDE 109 96 - 111 mmol/L    CO2 TOTAL 23 23 - 31 mmol/L    ANION GAP 7 4 - 13 mmol/L    CALCIUM  9.1 8.6 - 10.3 mg/dL    GLUCOSE 85 65 - 874 mg/dL    BUN 10 8 - 25 mg/dL    CREATININE 8.92 (H) 0.60 - 1.05 mg/dL    eGFRcr - FEMALE 57 (L) >=60 mL/min/1.61m^2    BUN/CREA RATIO 9 6 - 22   TROPONIN-I (FOR ED ONLY)   Result Value Ref Range    TROPONIN-I HS <2.7 <=14.0 ng/L ng/L   CBC WITH DIFF   Result Value Ref Range    WBC 6.8 3.7 - 11.0 x10^3/uL    RBC 4.62 3.85 - 5.22 x10^6/uL    HGB 13.5 11.5 - 16.0 g/dL    HCT 59.4 65.1 - 53.9 %    MCV 87.7 78.0 - 100.0 fL    MCH 29.2 26.0 - 32.0 pg    MCHC 33.3 31.0 - 35.5 g/dL    RDW-CV 86.2 88.4 - 84.4 %    PLATELETS 323 150 - 400 x10^3/uL    MPV 8.8 8.7 - 12.5 fL    NEUTROPHIL % 60.8 %    LYMPHOCYTE % 29.0 %    MONOCYTE % 9.2 %    EOSINOPHIL % 0.0 %    BASOPHIL % 0.7 %    NEUTROPHIL  # 4.10 1.50 - 7.70 x10^3/uL    LYMPHOCYTE # 1.96 1.00 - 4.80 x10^3/uL    MONOCYTE # 0.62 0.20 - 1.10 x10^3/uL    EOSINOPHIL # <0.10 <=0.50 x10^3/uL    BASOPHIL # <0.10 <=0.20 x10^3/uL    IMMATURE GRANULOCYTE % 0.3 0.0 - 1.0 %    IMMATURE GRANULOCYTE # <0.10 <0.10 x10^3/uL   BLOOD GAS W/ LACTATE REFLEX Venous   Result Value Ref Range    %FIO2 (VENOUS) 21 %    PH (VENOUS) 7.40 7.32 - 7.43    PCO2 (VENOUS) 43 41 - 51 mm/Hg    PO2 (VENOUS) 34 35 - 50 mm/Hg    BICARBONATE (VENOUS) 25.2 22.0 - 29.0 mmol/L    BASE EXCESS 1.5 0.0 - 3.0 mmol/L    O2 SATURATION (VENOUS) 65.5 40.0 - 85.0 %    LACTATE 1.0 <=1.9 mmol/L       RADIOLOGY RESULTS   Results for orders placed or performed during the hospital encounter of 09/18/24 (from the past 72 hours)   XR AP MOBILE CHEST     Status: None    Narrative    [name]  Female, 68 years old.    XR AP MOBILE CHEST performed on 09/18/2024 1:51 PM.    REASON FOR EXAM:  chest pain    TECHNIQUE: 1 views/1 images submitted for interpretation.    COMPARISON:  CT chest 07/26/2024  FINDINGS:     Lines, tubes  and hardware: None.    Lungs and pleura: No pulmonary or pleural based abnormality is identified.     Heart and mediastinum: The heart size is normal for technique. The mediastinal contours are normal.      Bones: No acute bony abnormality is identified.      Impression    No acute pulmonary abnormality.       CT ANGIO CHEST FOR PULMONARY EMBOLUS W IV CONTRAST     Status: None    Narrative    CT ANGIO CHEST FOR PULMONARY EMBOLUS W IV CONTRAST performed on 09/18/2024 6:02 PM    INDICATION: 67 years old Female  CP SOB sever fatigue    TECHNIQUE: Axial images from the thoracic inlet through the lungs obtained after administration of IV contrast with reformatted coronal and sagittal images, utilizing soft tissue and lung algorithms    CONTRAST: 70 cc of Isovue  370    RADIATION DOSE: 479.25 mGy.cm    COMPARISON: CT chest without IV contrast 07/26/2024    FINDINGS:  Vasculature: The  central and subsegmental pulmonary arteries are patent without evidence for pulmonary embolus. The thoracic aorta and pulmonary artery diameters are normal.    Lungs/pleura: The central airways are patent. No focal consolidation, pleural effusion, or pneumothorax. Bibasilar atelectasis is noted. Stable pulmonary nodule in the left upper lobe measuring 4 mm. Again seen is a subpleural groundglass left lower lobe focus measuring 5 mm (series 4, image 203), previously 8 mm on 07/26/2024.    Heart/Mediastinum: No cardiomegaly. No pericardial effusion. No mediastinal mass. Coronary artery calcifications are noted.    Abdomen: The included upper abdominal structures show no acute abnormality.    Other: No acute soft tissue abnormalities. No acute osseous abnormalities.        Impression    1. No evidence of pulmonary embolus or other acute cardiopulmonary abnormality.  2. Subpleural groundglass left lower lobe focus measuring 5 mm, previously 8 mm on 07/26/2024  3. Small hiatal hernia with thickening and dilation of the esophagus.     CT BRAIN WO IV CONTRAST     Status: None    Narrative    [name]  Female, 68 years old.    CT BRAIN WO IV CONTRAST performed on 09/18/2024 6:11 PM    INDICATION: 68 years old Female HA w fatigue    TECHNIQUE: CT of the head acquired without contrast administration    RADIATION DOSE: 1000.95 mGy.cm    COMPARISON: MRI brain with and without contrast 06/16/2022    FINDINGS:     Brain: No acute territorial infarction or intraparenchymal hemorrhage. No mass effect. No midline shift. Mild periventricular white matter hypodensities.    Ventricles/extra-axial spaces: No hydrocephalus. No extra-axial hemorrhage or fluid collection.    Other findings: Mucosal thickening of the left sphenoid sinus.        Impression    No acute intracranial process.         MICROBIOLOGY  No results found for any visits on 09/18/24 (from the past 96 hours).     CODE STATUS:  Full code    ASSESSMENT/PLAN:  68  y.o. female with past medical history of coronary artery disease with myocardial infarction x1 s/p stents x3 gastroesophageal reflux disease, chronic pain, bladder irritability, mood disorder presenting with 1 day hx of chest pain and chest pressure similar to previous MI. EKG NSR, trop negative. CT PE negative for pulmonary embolism. HEART score 5-6. Pt called for admission to [facility] for CPRO  and stress testing.     Admit to [facility]  Dr. Michelle  vitals q4h  SDS    Chest pain, rule out acute myocardial infarction  - EKG on admission NSR  - trop on admission negative  - HEART score 5/6   - Per Heart Score study, risk of adverse cardiac event 12-16%, and recommending admission to hospital  - status post aspirin  load in ED  Plan:  -MPS ordered   -NPO at midnight  - EKG p.r.n. for chest pain  - Delta trop ordered  -TTE ordered   - if unable to be completed okay for outpatient  - continue home cardiac meds:  ASA 81 mg, Imdur  60 mg q.a.m., metoprolol  succinate 25 daily, Crestor  40  qhs  - COVID swab pending ISO D/cough w/ CP  - On continuous tele    Chronic conditions:  Gastroesophageal reflux disease:  Pepcid  40 b.i.d., Protonix  40 b.i.d.  Chronic pain:  Gabapentin  800 b.i.d.  Bladder irritability:  Mirabegron  25 mg daily  Mood disorder:  Home desvenlafaxine  substituted for Effexor  150 mg q.a.m.      Nutrition:  Regular diet, NPO at midnight  Fluids:  PO  GI prophylaxis:  Protonix   DVT/PE:  Lovenox   PT/OT:  n/a  Disposition: Patient currently receiving ongoing treatment requiring hospital admission at step down status       Carlo Mills, MD    19:32;09/18/2024        I saw and examined the patient.  I reviewed the resident's note.  I agree with the findings and plan of care as documented in the resident's note.  Any exceptions/additions are edited/noted.    Dasie Michelle, MD         [1]   Allergies  Allergen Reactions    Cymbalta  [Duloxetine ]      Rash on feet and hands    Blue Dye      Told to avoid due to patch test  result by Elita LUNA    Flavoring Agent  Other Adverse Reaction (Add comment)     Patient unsure of reaction. Found on skin test    Lipitor [Atorvastatin ] Myalgia     Muscle pain    Lisinopril  Other Adverse Reaction (Add comment)     Cough      Nickel      Blisters  Allergic to all kinds of metal     Codeine  Itching     RASH    Mobic  [Meloxicam ] Nausea/ Vomiting     Nervous and shakes     Naprosyn  [Naproxen ] Nausea/ Vomiting    Nsaids (Non-Steroidal Anti-Inflammatory Drug) Nausea/ Vomiting   [2]   Social History  Tobacco Use    Smoking status: Former     Current packs/day: 0.00     Average packs/day: 2.0 packs/day for 40.0 years (80.0 ttl pk-yrs)     Types: Cigarettes     Start date: 01/22/1965     Quit date: 01/22/2005     Years since quitting: 19.6    Smokeless tobacco: Never   Vaping Use    Vaping status: Never Used   Substance Use Topics    Alcohol use: No     Alcohol/week: 0.0 standard drinks of alcohol    Drug use: No

## 2024-09-18 NOTE — ED Provider Notes (Signed)
 [facility] - Emergency Department  ED Primary Note  History of Present Illness   [name] is a 68 y.o. female who had concerns including Chest Pain . For 4 days, patient reports she has had increased fatigue, noting she slept 21 hours straight on 11/23. Today around 09:00, patient endured the onset of a sharp chest pain with associated SOB, nausea, and a pressure headache. She is unsure if it radiates to her back as she has chronic back pain. Patient indicates these symptoms are congruent with what she endured with her past MI, so she presents to the ED for evaluation. Last BM was yesterday. In the ED, she notes her chest pain is no longer sharp but is now a heavy pressure sensation.    PMH notable for HLD, CAD, MI /p x3 stents, TAH, appendectomy, cholecystectomy. Medications include 81 mg ASA. Review chart for allergies. Denies any increased urinary frequency, new vision changes, lightheadedness, dizziness, new BLE pain, emesis, numbness, tingling, new gait imbalance, new BLE swelling, recent falls/trauma.     Physical Exam   ED Triage Vitals   BP (Non-Invasive) 09/18/24 1247 136/85   Heart Rate 09/18/24 1247 (!) 106   Respiratory Rate 09/18/24 1247 (!) 21   Temperature 09/18/24 1247 37 C (98.6 F)   SpO2 09/18/24 1247 98 %   Weight 09/18/24 1244 102 kg (224 lb 13.9 oz)   Height 09/18/24 1244 1.651 m (5' 5)     Physical Exam  Vitals and nursing note reviewed.   Constitutional:       General: She is not in acute distress.     Appearance: She is well-developed.   HENT:      Head: Normocephalic and atraumatic.      Right Ear: External ear normal.      Left Ear: External ear normal.      Nose: Nose normal.      Mouth/Throat:      Mouth: Mucous membranes are moist.   Eyes:      Extraocular Movements: Extraocular movements intact.      Conjunctiva/sclera: Conjunctivae normal.      Pupils: Pupils are equal, round, and reactive to light.   Cardiovascular:      Rate and Rhythm: Normal rate.       Pulses: Normal pulses.   Pulmonary:      Effort: Pulmonary effort is normal. No respiratory distress.      Breath sounds: Normal breath sounds.   Abdominal:      General: There is no distension.      Palpations: Abdomen is soft.      Tenderness: There is no abdominal tenderness. There is no guarding or rebound.   Musculoskeletal:         General: No swelling.      Cervical back: Neck supple.      Right lower leg: No edema.      Left lower leg: No edema.   Skin:     General: Skin is warm and dry.      Capillary Refill: Capillary refill takes less than 2 seconds.      Coloration: Skin is not pale.   Neurological:      Mental Status: She is alert. Mental status is at baseline.   Psychiatric:         Mood and Affect: Mood normal.       Patient Data   Labs Ordered/Reviewed   BASIC METABOLIC PANEL - Abnormal; Notable for the following  components:       Result Value    CREATININE 1.07 (*)     eGFRcr - FEMALE 57 (*)     All other components within normal limits   BASIC METABOLIC PANEL - Abnormal; Notable for the following components:    CREATININE 1.13 (*)     eGFRcr - FEMALE 53 (*)     All other components within normal limits   TROPONIN-I (FOR ED ONLY) - Normal   MAGNESIUM  - Normal   PHOSPHORUS - Normal   TROPONIN-I - Normal   COVID-19, FLU A/B, RSV RAPID BY PCR - Normal    Narrative:     Results are for the simultaneous qualitative identification of SARS-CoV-2 (formerly 2019-nCoV), Influenza A, Influenza B, and RSV RNA. These etiologic agents are generally detectable in nasopharyngeal and nasal swabs during the ACUTE PHASE of infection. Hence, this test is intended to be performed on respiratory specimens collected from individuals with signs and symptoms of upper respiratory tract infection who meet Centers for Disease Control and Prevention (CDC) clinical and/or epidemiological criteria for Coronavirus Disease 2019 (COVID-19) testing. CDC COVID-19 criteria for testing on human specimens is available at [facility]  webpage information for Healthcare Professionals: Coronavirus Disease 2019 (COVID-19) (koshercutl[employer].com.au).     False-negative results may occur if the virus has genomic mutations, insertions, deletions, or rearrangements or if performed very early in the course of illness. Otherwise, negative results indicate virus specific RNA targets are not detected, however negative results do not preclude SARS-CoV-2 infection/COVID-19, Influenza, or Respiratory syncytial virus infection. Results should not be used as the sole basis for patient management decisions. Negative results must be combined with clinical observations, patient history, and epidemiological information. If upper respiratory tract infection is still suspected based on exposure history together with other clinical findings, re-testing should be considered.    Test methodology:   Cepheid Xpert Xpress SARS-CoV-2/Flu/RSV Assay real-time polymerase chain reaction (RT-PCR) test on the GeneXpert Dx and Xpert Xpress systems.   CBC/DIFF    Narrative:     The following orders were created for panel order CBC/DIFF.  Procedure                               Abnormality         Status                     ---------                               -----------         ------                     CBC WITH IPQQ[[id number]]                                    Final result                 Please view results for these tests on the individual orders.   CBC WITH DIFF   BLOOD GAS W/ LACTATE REFLEX    Narrative:     Manufacturer does not recommend venous sample for assessment of patient oxygenation status. A reference range for pO2, Oxyhemoglobin and Oxygen Saturation is provided but abnormal results will not flag in Epic.  CBC/DIFF    Narrative:     The following orders were created for panel order CBC/DIFF.  Procedure                               Abnormality         Status                     ---------                               -----------          ------                     CBC WITH IPQQ[[id number]]                                    Final result                 Please view results for these tests on the individual orders.   CBC WITH DIFF     CT BRAIN WO IV CONTRAST   Final Result by Edi, Radresults In (11/25 1829)   No acute intracranial process.         CT ANGIO CHEST FOR PULMONARY EMBOLUS W IV CONTRAST   Final Result by Edi, Radresults In (11/25 1835)   1. No evidence of pulmonary embolus or other acute cardiopulmonary abnormality.   2. Subpleural groundglass left lower lobe focus measuring 5 mm, previously 8 mm on 07/26/2024   3. Small hiatal hernia with thickening and dilation of the esophagus.         XR AP MOBILE CHEST   Final Result by Edi, Radresults In (11/25 1401)   No acute pulmonary abnormality.              Medical Decision Making        Medical Decision Making  Amount and/or Complexity of Data Reviewed  Radiology: ordered.  ECG/medicine tests: independent interpretation performed.    Risk  OTC drugs.  Prescription drug management.  Parenteral controlled substances.  Decision regarding hospitalization.      ED Course as of 09/21/24 1521   Tue Sep 18, 2024   1643 PH: 7.40   1643 PCO2: 43   1643 TROPONIN-I HS: <2.7   1643 WBC: 6.8   1643 HGB: 13.5   1643 CREATININE(!): 1.07   CT_PE negative  CT brain negative  HEART score 6  Pt admitted for CP rule out         Medications Ordered/Administered in the ED   aspirin  chewable tablet 324 mg (324 mg Oral Given 09/18/24 1748)   morphine  4 mg/mL injection (4 mg Intravenous Given 09/18/24 1750)   iopamidol  (ISOVUE -370) 76% solution (70 mL Intravenous Given 09/18/24 1803)     Clinical Impression   Chest pain   Elevated serum creatinine (Primary)       Disposition: Admitted         I am scribing for, and in the presence of, Dr. Berneda Endo, for services provided on 09/18/2024  Madalyn Crawley, SCRIBE    // Madalyn Crawley, SCRIBE 09/18/2024, 16:42  I personally performed the services described in  this documentation, as scribed  in my presence, and it is both accurate  and  complete.    Berneda Endo, MD

## 2024-09-18 NOTE — ED Nurses Note (Signed)
Report received from previous RN. Care of patient assumed at this time.

## 2024-09-18 NOTE — ED Nurses Note (Signed)
 The risk and benefits have been discussed with the patient in regards to placing a peripheral intravenous catheter (PIV) and/or drawing labs in the triage area.  The patient was advised that they may have to wait in the ED waiting room after the PIV is placed.  The patient was advised not to tamper with the PIV or infuse anything through the PIV.  The patient was advised if there is any concern or problem with the PIV to contact a nurse or a staff representative to contact a nurse for the patient.  The patient was advised if they choose to leave before being seen by a provider or without treatment; the patient needs to notify the a nurse or a staff representative to contact a nurse, so the PIV can be removed.  The patient is aware not to leave the ED waiting room area while the PIV is in place.  The patient verbalizes understanding and agrees with the plan of action.    A 20g PIV was placed in the L AC. After preparing the site with a Chlora-prep.  Labs were drawn, labeled (after checking the pt's I.D., band and labels confirming the pt's identification) and sent to the lab. The PIV was covered with a sterile dressing and secured with tape.

## 2024-09-18 NOTE — ED Attending Note (Signed)
 ED ATTENDING NOTE:    Note begun by: Powell Dollar, DO on 09/18/2024 at 4:46 PM    I was physically present and directly supervised this patients care. Patient seen and examined with the resident/APP and history and exam reviewed. Key elements in addition to and/or correction of that documentation are as follows:    HPI:  [name] is a 68 y.o. female who presents with complaint of CP, dyspnea that onset this morning 8 am. HA onset two hours ago. Fatigue x 1 week. Denies changes in vision but does feel lightheaded w/ the HA. Sees family medicine. Has stents. Last stress test was in 2017 in our system. PMH CAD s/p stents, GERD.     Further historical details can be found in the resident/MLP note.      PERTINENT PHYSICAL EXAM:  Nursing note and vitals reviewed.    ED Triage Vitals   BP (Non-Invasive) 09/18/24 1247 136/85   Heart Rate 09/18/24 1247 (!) 106   Respiratory Rate 09/18/24 1247 (!) 21   Temperature 09/18/24 1247 37 C (98.6 F)   SpO2 09/18/24 1247 98 %   Weight 09/18/24 1244 102 kg (224 lb 13.9 oz)   Height 09/18/24 1244 1.651 m (5' 5)       I have seen and physically examined the patient.  I agree with the physical exam as documented in the resident/APP note.  Any additions or changes are noted below:      LABS: Reviewed    IMAGING: Reviewed    EKG: Reviewed    ED COURSE/MEDICAL DECISION MAKING:     68 yo F with CAD s/p three stents, GERD presenting w/ CP, dyspnea that onset this morning. States it feels similar to her prior MI. Tachycardic. CTA chest negative for PE. EKG without ischemic changes and trop negative, but HEART score is a 5. Given ASA load and a dose of morphine  for pain in the ED (declined nitro given HA). CT brain negative. Admitted to family med for ACS r/o.    DISPO: Admitted    CLINICAL IMPRESSION:   Clinical Impression   Chest pain   Elevated serum creatinine (Primary)       Powell Dollar, DO    Parts of this patients chart were completed in a retrospective fashion due to  simultaneous direct patient care activities in the Emergency Department.   This note was partially generated using MModal Fluency Direct system, and there may be some incorrect words, spellings, and punctuation that were not noted in checking the note before saving.

## 2024-09-18 NOTE — ED Notes (Signed)
 Report received from [facility]

## 2024-09-18 NOTE — ED Nurses Note (Signed)
 Pt given boxed lunch

## 2024-09-18 NOTE — ED Nurses Note (Signed)
 ED 31    pt reporting 8/10 pain. does not want ordered PRN tylenol .    #[id number]

## 2024-09-18 NOTE — ED Nurses Note (Addendum)
 Patient is accompanied to the ED with friend. Patient reports chest pain x1 day, that radiates to back. Describes as pressure. Rates 4/10. Hx of a MI in 2024 and stents. Reports symptoms are similar to previous MI. Reports weakness for a four days. No blood thinners.

## 2024-09-19 ENCOUNTER — Inpatient Hospital Stay (HOSPITAL_COMMUNITY)

## 2024-09-19 ENCOUNTER — Encounter (HOSPITAL_COMMUNITY): Payer: Self-pay | Admitting: Student in an Organized Health Care Education/Training Program

## 2024-09-19 DIAGNOSIS — R9431 Abnormal electrocardiogram [ECG] [EKG]: Secondary | ICD-10-CM

## 2024-09-19 DIAGNOSIS — R079 Chest pain, unspecified: Secondary | ICD-10-CM

## 2024-09-19 DIAGNOSIS — R7989 Other specified abnormal findings of blood chemistry: Principal | ICD-10-CM

## 2024-09-19 DIAGNOSIS — N3289 Other specified disorders of bladder: Secondary | ICD-10-CM

## 2024-09-19 DIAGNOSIS — G8929 Other chronic pain: Secondary | ICD-10-CM

## 2024-09-19 LAB — BASIC METABOLIC PANEL
ANION GAP: 8 mmol/L (ref 4–13)
BUN/CREA RATIO: 11 (ref 6–22)
BUN: 14 mg/dL (ref 8–25)
CALCIUM: 8.6 mg/dL (ref 8.6–10.3)
CHLORIDE: 107 mmol/L (ref 96–111)
CO2 TOTAL: 25 mmol/L (ref 23–31)
CREATININE: 1.25 mg/dL — ABNORMAL HIGH (ref 0.60–1.05)
GLUCOSE: 107 mg/dL (ref 65–125)
POTASSIUM: 3.9 mmol/L (ref 3.5–5.1)
SODIUM: 140 mmol/L (ref 136–145)
eGFRcr - FEMALE: 47 mL/min/1.73mˆ2 — ABNORMAL LOW (ref >=60)

## 2024-09-19 LAB — CBC WITH DIFF
BASOPHIL #: <0.1 x10ˆ3/uL (ref <=0.20)
BASOPHIL %: 1 %
EOSINOPHIL #: <0.1 x10ˆ3/uL (ref <=0.50)
EOSINOPHIL %: 0.2 %
HCT: 36.1 % (ref 34.8–46.0)
HGB: 12 g/dL (ref 11.5–16.0)
IMMATURE GRANULOCYTE #: <0.1 x10ˆ3/uL (ref <0.10)
IMMATURE GRANULOCYTE %: 0.2 % (ref 0.0–1.0)
LYMPHOCYTE #: 1.99 x10ˆ3/uL (ref 1.00–4.80)
LYMPHOCYTE %: 31.5 %
MCH: 29.3 pg (ref 26.0–32.0)
MCHC: 33.2 g/dL (ref 31.0–35.5)
MCV: 88.3 fL (ref 78.0–100.0)
MONOCYTE #: 0.61 x10ˆ3/uL (ref 0.20–1.10)
MONOCYTE %: 9.7 %
MPV: 8.8 fL (ref 8.7–12.5)
NEUTROPHIL #: 3.63 x10ˆ3/uL (ref 1.50–7.70)
NEUTROPHIL %: 57.4 %
PLATELETS: 289 x10ˆ3/uL (ref 150–400)
RBC: 4.09 x10ˆ6/uL (ref 3.85–5.22)
RDW-CV: 14.2 % (ref 11.5–15.5)
WBC: 6.3 x10ˆ3/uL (ref 3.7–11.0)

## 2024-09-19 LAB — ECG 12-LEAD
Atrial Rate: 96 {beats}/min
Atrial Rate: 72 {beats}/min
Calculated P Axis: 26 degrees
Calculated P Axis: 23 degrees
Calculated R Axis: 10 degrees
Calculated R Axis: -11 degrees
Calculated T Axis: -8 degrees
Calculated T Axis: -6 degrees
PR Interval: 158 ms
PR Interval: 164 ms
QRS Duration: 76 ms
QRS Duration: 82 ms
QT Interval: 340 ms
QT Interval: 400 ms
QTC Calculation: 429 ms
QTC Calculation: 438 ms
Ventricular rate: 96 {beats}/min
Ventricular rate: 72 {beats}/min

## 2024-09-19 LAB — MYOCARDIAL PERFUSION COMPLETE
% MPHR: 65 %
Baseline HR: 64 {beats}/min
EF: 73
Nuc Stress EF: 73 %
Peak Diastolic BP for Stress Tests: 60 mmHg
Peak Systolic BP Stress Test: 118 mmHg
Post peak HR: 100 {beats}/min
Predicted Max HR: 153 {beats}/min
RPP: 11800
ST DEPRESSION - STRESS: 0 mm
TID: 0.96
Target HR: 130 {beats}/min

## 2024-09-19 LAB — TRANSTHORACIC ECHOCARDIOGRAM - ADULT: EF MEASUREMENT VALUE: 55.5

## 2024-09-19 LAB — MAGNESIUM: MAGNESIUM: 2.2 mg/dL (ref 1.8–2.6)

## 2024-09-19 LAB — PHOSPHORUS: PHOSPHORUS: 4.7 mg/dL — ABNORMAL HIGH (ref 2.3–4.0)

## 2024-09-19 MED ORDER — REGADENOSON 0.4 MG/5 ML INTRAVENOUS SYRINGE
0.4000 mg | INJECTION | INTRAVENOUS | Status: AC
Start: 2024-09-19 — End: 2024-09-19
  Administered 2024-09-19: 0.4 mg via INTRAVENOUS

## 2024-09-19 MED ORDER — ELECTROLYTE-R (PH 7.4) BOLUS
500.0000 mL | Freq: Once | INTRAVENOUS | Status: AC
Start: 2024-09-19 — End: 2024-09-19
  Administered 2024-09-19: 500 mL via INTRAVENOUS
  Administered 2024-09-19: 0 mL via INTRAVENOUS

## 2024-09-19 MED ORDER — CYCLOBENZAPRINE 5 MG TABLET
5.0000 mg | ORAL_TABLET | Freq: Once | ORAL | Status: AC
Start: 2024-09-19 — End: 2024-09-19
  Administered 2024-09-19: 5 mg via ORAL
  Filled 2024-09-19: qty 1

## 2024-09-19 NOTE — Care Management Notes (Signed)
 [facility]  Care Management Initial Evaluation    Patient Name: [name]  Date of Birth: 05-May-1956  Sex: female  Date/Time of Admission: 09/18/2024  4:29 PM  Room/Bed: 20/A  Payor: [place] MEDICARE / Plan: [place] MEDICARE DUAL COMPLETE / Product Type: MEDICARE [facility] /   Primary Care Providers:  Ezra Quaker, PA-C, PA-C (General)  Brenwalt, Amy, MD, MD      (Managed Care/Insurance)    Pharmacy Info:   Preferred Pharmacy       [facility] [address] [place] - [address]    [address] [facility] [place] [address]    Phone: [phone] Fax: [phone]    Hours: Not open 24 hours    [facility] - [address], [place] - [address]    [address] [place] [address]    Phone: [phone] Fax: [phone]    Hours: Not open 24 hours          Emergency Contact Info:   Extended Emergency Contact Information  Primary Emergency Contact: [name]  Address: [address]           [place] [facility] [place]  Home Phone: [phone]  Work Phone: [phone]  Mobile Phone: [phone]  Relation: Daughter  Preferred language: English  Interpreter needed? No    History:   [name] is a 68 y.o., female, admitted for Chest pain, rule out acute myocardial infarction [R07.9]    Height/Weight: 165.1 cm (5' 5) / 104 kg (228 lb 13.4 oz)     LOS: 1 day   Admitting Diagnosis: Chest pain, rule out acute myocardial infarction [R07.9]    Assessment:      09/19/24 1407   Assessment Details   Assessment Type Admission   Date of Care Management Update 09/19/24   Date of Next DCP Update 09/21/24   Readmission   Is this a readmission? No   Insurance Information/Type   Insurance type Medicare  ([place] Medicare Adv, primary, [place] Medicaid, secondary)   Employment/Financial   Patient has Prescription Coverage?  Yes        Name of Insurance Coverage for Medications [place] Medicare Adv   Financial/Environmental Concerns none   Living Environment   Select an age group  to open lives with row.  Adult   Lives With alone   Living Arrangements apartment   Able to Return to Prior Arrangements yes   Home Safety   Home Assessment: No Problems Identified   Home Accessibility stairs to enter home;no concerns;stairs (1 railing present)   Custody and Legal Status   Do you have a court appointed guardian/conservator? No   Care Management Plan   Discharge Planning Status initial meeting   Projected Discharge Date 09/21/24   Discharge plan discussed with: Patient   CM will evaluate for rehabilitation potential yes   Discharge Needs Assessment   Outpatient/Agency/Support Group Needs   (TBD)   Equipment Currently Used at Home grab bar   Equipment Needed After Discharge   (TBD)   Discharge Facility/Level of Care Needs Home (Patient/Family Member/other)(code 1)   Transportation Available family or friend will provide;car   Referral Information   Admission Type inpatient   Address Verified verified-no changes   Arrived From home or self-care   ADVANCE DIRECTIVES   Does the Patient have an Advance Directive? No, Information Offered and Refused   Patient Requests Assistance in Having Advance Directive Notarized. N/A  LAY CAREGIVER    Appointed Lay Caregiver? I Decline   Home Main Entrance   Number of Stairs, Main Entrance three   Stair Railings, Main Entrance railing on left side (ascending)     Pt admitted from home for Chest pain, rule out acute myocardial infarction. Receiving continuous telemetry monitoring& Pulse oximetry Q4H. CPRO & stress test completed. Labs & imaging ordered.    DCP met with the patient at bedside to complete the initial assessment.   Pt lives in a apartment  alone and plans to return at discharge  Address and phone number: confirmed    Physical address (if PO Box): NA.   Pt's friends will be assisting the pt after discharge.   Pt has needed utilities.   3 steps outside with left sided handrail.  No stairs within home.  Pt able to stay on the main floor of the home.    Transportation at d/c: Pt stated she drove herself to the hospital and she will drive herself home.  Health insurance: [place] Medicare Adv, primary, [place] Medicaid, secondary confirmed.  Prescription coverage: [place] Medicare Adv,   [phone]   Medications are affordable; patient manages medications by pill planner.  Name of PCP/clinic: Kristi Dalton, last appointment was 1 month ago.   Pt was independent prior to admission; pt was not active with homecare agency/caregivers.   No Caregivers  Pt uses the following DME: grab bars.   No Home O2    No Trach supplies  Pt is not on dialysis.  MPOA: information declined  Lay caregiver appointed: declined        Discharge Plan:  Home (Patient/Family Member/other) (code 1)      The patient will continue to be evaluated for developing discharge needs.     Case Manager: Tyson Howell BONE PLANNER  Phone: [phone]

## 2024-09-19 NOTE — Progress Notes (Signed)
 Department of Family Medicine  Inpatient Daily Progress Note     [name], [place] 68 y.o. female  Date of Birth:  Feb 11, 1956  Date of Admission:  09/18/2024      Date of service: 09/19/2024  CC: Chest pain, rule out acute myocardial infarction    SUBJECTIVE  Overnight: No acute events overnight    This morning: Reports feeling well overall. No acute concerns    Denies chest pain, palpitations, dyspnea, fevers, chills.  Appetite is normal.     ROS: As discussed above in the HPI; otherwise all other systems negative.    Medications:     Scheduled:    aspirin  chewable tablet 81 mg, 81 mg, QAM    enoxaparin  PF (LOVENOX ) 40 mg/0.4 mL SubQ injection, 40 mg, Q24H    famotidine  (PEPCID ) tablet, 40 mg, 2x/day    gabapentin  (NEURONTIN ) capsule, 800 mg, 2x/day    isosorbide  mononitrate (IMDUR ) 24 hr extended release tablet, 60 mg, QAM    metoprolol  succinate (TOPROL -XL) 24 hr extended release tablet, 25 mg, Q24H    mirabegron  (MYRBETRIQ ) 24 hr extended release tablet, 25 mg, Daily    NS flush syringe, 2-6 mL, Q8HRS    pantoprazole  (PROTONIX ) delayed release tablet, 40 mg, 2x/day    perflutren  lipid microspheres (DEFINITY ) 1.3 mL in NS 10 mL (tot vol) injection, 2 mL, Give in Cardiology    rosuvastatin  (CRESTOR ) tablet, 40 mg, QPM    sodium chloride  (OCEAN) 0.65% nasal spray, 2 Spray, 3x/day    venlafaxine  (EFFEXOR  XR) 24 hr extended release capsule, 150 mg, Daily with Breakfast   PRN:    acetaminophen  (TYLENOL ) tablet, 650 mg, Q4H PRN    D5W 250 mL flush bag, , Q15 Min PRN    NS 250 mL flush bag, , Q15 Min PRN    NS flush syringe, 2-6 mL, Q1 MIN PRN    ondansetron  (ZOFRAN ) tablet, 4 mg, Q8H PRN     OBJECTIVE  Vital Signs:  Patient Vitals for the past 24 hrs:   BP Temp Pulse Resp SpO2 Height Weight   09/19/24 0604 133/70 -- 76 -- 91 % -- --   09/19/24 0434 110/61 36.5 C (97.7 F) 76 20 92 % -- --   09/18/24 2315 (!) 145/65 36.4 C (97.5 F) 75 18 93 % -- --   09/18/24 2254 -- -- -- -- -- 1.651 m (5' 5) 104 kg (228 lb 13.4  oz)   09/18/24 2246 (!) 112/57 -- 70 20 92 % -- --   09/18/24 2104 (!) 120/56 -- 67 -- -- -- --   09/18/24 2100 (!) 120/56 36.6 C (97.8 F) 71 15 93 % -- --   09/18/24 1900 107/82 -- 89 -- 96 % -- --   09/18/24 1715 (!) 151/76 -- 84 18 100 % -- --   09/18/24 1713 (!) 145/84 -- 77 20 96 % -- --   09/18/24 1645 (!) 140/76 -- 74 16 96 % -- --   09/18/24 1247 136/85 37 C (98.6 F) (!) 106 (!) 21 98 % -- --   09/18/24 1244 -- -- -- -- -- 1.651 m (5' 5) 102 kg (224 lb 13.9 oz)     I/O:   Intake/Output Summary (Last 24 hours) at 09/19/2024 1735  Last data filed at 09/19/2024 1342  Gross per 24 hour   Intake 500 ml   Output --   Net 500 ml          BM: Date of Last  Bowel Movement: 09/18/24    Physical Exam:  Physical Exam  Vitals and nursing note reviewed.   Constitutional:       General: She is not in acute distress.     Appearance: Normal appearance.   HENT:      Head: Normocephalic and atraumatic.   Eyes:      Conjunctiva/sclera: Conjunctivae normal.   Cardiovascular:      Rate and Rhythm: Normal rate.   Pulmonary:      Effort: Pulmonary effort is normal. No respiratory distress.   Abdominal:      General: There is no distension.   Skin:     General: Skin is warm and dry.   Neurological:      General: No focal deficit present.      Mental Status: She is alert.   Psychiatric:         Behavior: Behavior normal.          Labs: I have personally reviewed laboratory values.  CBC Differential   Recent Labs     09/18/24  1328 09/18/24  2003 09/19/24  0433   WBC 6.8 7.0 6.3   HGB 13.5 13.4 12.0   HCT 40.5 39.8 36.1   PLTCNT 323 334 289    Recent Labs     09/18/24  1328 09/18/24  2003 09/19/24  0433   PMNS 60.8 60.1 57.4   MONOCYTES 9.2 7.8 9.7   BASOPHILS 0.7  <0.10 0.6  <0.10 1.0  <0.10   PMNABS 4.10 4.22 3.63   LYMPHSABS 1.96 2.18 1.99   MONOSABS 0.62 0.55 0.61   EOSABS <0.10 <0.10 <0.10      BMP LFTs   Recent Labs     09/18/24  1328 09/18/24  2003 09/19/24  0433   SODIUM 139 141 140   POTASSIUM 4.3 3.7 3.9   CHLORIDE  109 107 107   CO2 23 25 25    BUN 10 10 14    CREATININE 1.07* 1.13* 1.25*   ANIONGAP 7 9 8    BUNCRRATIO 9 9 11    GFR 57* 53* 47*   CALCIUM  9.1 9.0 8.6   MAGNESIUM   --  2.2 2.2   PHOSPHORUS  --  3.5 4.7*    No results for input(s): TOTALPROTEIN, ALBUMIN, PREALBUMIN, TOTBILIRUBIN, BILIRUBINCON, UROBILINOGEN, AST, ALT, ALKPHOS, GAMMAGT, LDH, AMYLASE, LIPASE, AMMONIA in the last 72 hours.   CoAgs Blood Gas:   No results for input(s): PROTHROMTME, INR, APTT, DDIMERQUANT, FIBRINOGEN in the last 72 hours. Recent Labs     09/18/24  1328   PH 7.40   PCO2 43   PO2 34   BICARBONATE 25.2   BASEEXCESS 1.5       Cardiac Markers Lipid Panel   No results for input(s): TROPONINI, BNP in the last 72 hours. No results for input(s): CHOLESTEROL, HDL, HDLCHOL, LDLCHOL, TRIG, VLDLCAL in the last 72 hours.   Urine Analysis Other Labs   Recent Labs     09/19/24  0433   GLUCOSE 107    No results found for this encounter    Invalid input(s): PRL      Radiology Results: I have personally reviewed imaging and imaging reports.  CT ANGIO CHEST FOR PULMONARY EMBOLUS W IV CONTRAST  Result Date: 09/18/2024  Impression 1. No evidence of pulmonary embolus or other acute cardiopulmonary abnormality. 2. Subpleural groundglass left lower lobe focus measuring 5 mm, previously 8 mm on 07/26/2024 3. Small hiatal hernia with thickening and dilation of the esophagus.  CT BRAIN WO IV CONTRAST  Result Date: 09/18/2024  Impression No acute intracranial process.     XR AP MOBILE CHEST  Result Date: 09/18/2024  Impression No acute pulmonary abnormality.       Microbiology: No results found for any visits on 09/18/24 (from the past 96 hours).     ASSESSMENT/PLAN:   68 y.o. female with past medical history of coronary artery disease with myocardial infarction x1 s/p stents x3 gastroesophageal reflux disease, chronic pain, bladder irritability, mood disorder presenting with 1 day hx of chest pain and chest  pressure similar to previous MI. EKG NSR, trop negative. CT PE negative for pulmonary embolism. HEART score 5. Pt called for admission to [facility] for CPRO and stress testing.      Chest pain, rule out acute myocardial infarction  - EKG on admission NSR  - trop on admission negative  - HEART score 5  - RVP negative  - trop <2.7 xs2  Plan:  - MPS and TTE to be completed today  - NPO pending MPS  - EKG p.r.n. for chest pain  - continue home cardiac meds:  ASA 81 mg, Imdur  60 mg q.a.m., metoprolol  succinate 25 daily, Crestor  40  qhs  - COVID swab pending ISO D/cough w/ CP  - On continuous tele     Chronic conditions:  Gastroesophageal reflux disease:  Pepcid  40 b.i.d., Protonix  40 b.i.d.  Chronic pain:  Gabapentin  800 b.i.d.  Bladder irritability:  Mirabegron  25 mg daily  Mood disorder:  Home desvenlafaxine  substituted for Effexor  150 mg q.a.m.     Nutrition:  NPO for MPS  Fluids:  bolus  GI prophylaxis:  Protonix   DVT/PE:  Lovenox   PT/OT:  n/a  Disposition: Disposition: Patient currently receiving ongoing treatment requiring hospital admission at step down status  - May be discharged this afternoon pending MPS and TTE    / Bernardino Hint, MD   Family Medicine, PGY-2  09/19/2024 07:30      I saw and examined the patient.  I reviewed the resident's note.  I agree with the findings and plan of care as documented in the resident's note.  Any exceptions/additions are edited/noted.    Dasie Bott, MD

## 2024-09-19 NOTE — Care Plan (Signed)
 Patient discharged home with family.  AVS reviewed with patient and a written copy was given.  Questions sufficiently answered as needed.  IV removed. Patient encouraged to follow up as indicated.  In the event of an emergency, patient/care giver instructed to call 911 or go to the nearest emergency room.       Problem: Wound  Goal: Optimal Coping  Outcome: Adequate for Discharge  Goal: Optimal Functional Ability  Outcome: Adequate for Discharge  Intervention: Optimize Functional Ability  Recent Flowsheet Documentation  Taken 09/19/2024 0800 by Powell BROCKS, RN  Activity Management: ROM, active encouraged  Activity Assistance Provided: assistance, stand-by  Goal: Absence of Infection Signs and Symptoms  Outcome: Adequate for Discharge  Goal: Improved Oral Intake  Outcome: Adequate for Discharge  Goal: Optimal Pain Control and Function  Outcome: Adequate for Discharge  Goal: Skin Health and Integrity  Outcome: Adequate for Discharge  Intervention: Optimize Skin Protection  Recent Flowsheet Documentation  Taken 09/19/2024 0800 by Powell BROCKS, RN  Pressure Reduction Techniques: Frequent weight shifting encouraged  Pressure Reduction Devices:   Repositioning wedges/pillows utilized   Pressure reduction chair cushion utilized  Activity Management: ROM, active encouraged  Goal: Optimal Wound Healing  Outcome: Adequate for Discharge  Intervention: Promote Wound Healing  Recent Flowsheet Documentation  Taken 09/19/2024 0800 by Powell BROCKS, RN  Pressure Reduction Techniques: Frequent weight shifting encouraged  Pressure Reduction Devices:   Repositioning wedges/pillows utilized   Pressure reduction chair cushion utilized  Activity Management: ROM, active encouraged     Problem: Adult Inpatient Plan of Care  Goal: Absence of Hospital-Acquired Illness or Injury  Outcome: Adequate for Discharge  Intervention: Identify and Manage Fall Risk  Recent Flowsheet Documentation  Taken 09/19/2024 0800 by Powell BROCKS, RN  Safety Promotion/Fall  Prevention:   activity supervised   fall prevention program maintained   nonskid shoes/slippers when out of bed   safety round/check completed  Intervention: Prevent Skin Injury  Recent Flowsheet Documentation  Taken 09/19/2024 0800 by Powell BROCKS, RN  Skin Protection:   adhesive use limited   incontinence pads utilized   tubing/devices free from skin contact  Goal: Optimal Comfort and Wellbeing  Outcome: Adequate for Discharge  Intervention: Provide Person-Centered Care  Recent Flowsheet Documentation  Taken 09/19/2024 0800 by Powell BROCKS, RN  Trust Relationship/Rapport:   care explained   choices provided   questions answered   reassurance provided  Goal: Rounds/Family Conference  Outcome: Adequate for Discharge     Problem: Depression  Goal: Decreased Depression Symptoms  Outcome: Adequate for Discharge     Problem: Health Knowledge, Opportunity to Enhance (Adult,Obstetrics,Pediatric)  Goal: Knowledgeable about Health Subject/Topic  Description: Patient will demonstrate the desired outcomes by discharge/transition of care.  Outcome: Adequate for Discharge     Problem: Pain Acute  Goal: Optimal Pain Control and Function  Outcome: Adequate for Discharge     Problem: Bed Safety Risk  Goal: Identify Risk Factors  Outcome: Adequate for Discharge  Goal: Bed Safety (Falls)  Description: Interventions:  1. Assess need for bed rails  2. Assist with self-care  3. Bottom rail down for egress  4. Top rail up for bed mobility/transfers  5. Keep bed in lowest position with wheels locked  6. Keep needed items within reach  Outcome: Adequate for Discharge  Goal: Bed Safety (Entrapment)  Description: Interventions:  1. Assess need for bed rails  2. Assess risk of entanglement  3. User proper size mattress to prevent entrapment  4. Reduce gaps between  mattress and rail  5. Use devices (bed rail pads/bolsters) as needed  Outcome: Adequate for Discharge     Problem: Fall Injury Risk  Goal: Absence of Fall and Fall-Related  Injury  Outcome: Adequate for Discharge  Intervention: Identify and Manage Contributors  Recent Flowsheet Documentation  Taken 09/19/2024 0800 by Powell BROCKS, RN  Self-Care Promotion: independence encouraged  Intervention: Promote Injury-Free Environment  Recent Flowsheet Documentation  Taken 09/19/2024 0800 by Powell BROCKS, RN  Safety Promotion/Fall Prevention:   activity supervised   fall prevention program maintained   nonskid shoes/slippers when out of bed   safety round/check completed     Problem: Skin Injury Risk Increased  Goal: Skin Health and Integrity  Outcome: Adequate for Discharge  Intervention: Optimize Skin Protection  Recent Flowsheet Documentation  Taken 09/19/2024 0800 by Powell BROCKS, RN  Pressure Reduction Techniques: Frequent weight shifting encouraged  Pressure Reduction Devices:   Repositioning wedges/pillows utilized   Pressure reduction chair cushion utilized  Skin Protection:   adhesive use limited   incontinence pads utilized   tubing/devices free from skin contact  Activity Management: ROM, active encouraged     Problem: Dysrhythmia  Goal: Normalized Cardiac Rhythm  Outcome: Adequate for Discharge     Problem: Chest Pain  Goal: Resolution of Chest Pain Symptoms  Outcome: Adequate for Discharge

## 2024-09-19 NOTE — Discharge Summary (Signed)
 [facility]  DISCHARGE SUMMARY    PATIENT NAME:  Tayten, Heber  MRN:  [id number]  DOB:  [birth year]    ENCOUNTER DATE:  09/18/2024  INPATIENT ADMISSION DATE: 09/18/2024  DISCHARGE DATE:  09/19/2024    ATTENDING PHYSICIAN: No att. providers found  SERVICE: DEPT OF FAMILY MEDICINE  PRIMARY CARE PHYSICIAN: Rayfield Barrack, PA-C       No lay caregiver identified.    PRIMARY DISCHARGE DIAGNOSIS: Chest pain, rule out acute myocardial infarction  Active Hospital Problems    Diagnosis Date Noted    Elevated serum creatinine [R79.89] 09/19/2024    Other chronic pain [G89.29] 09/19/2024    Bladder irritation [N32.89] 09/19/2024      Resolved Hospital Problems    Diagnosis     Principal Problem: Chest pain, rule out acute myocardial infarction [R07.9]      Active Non-Hospital Problems    Diagnosis Date Noted    Screening for colorectal cancer 07/19/2024    Personal history of adenomatous and serrated colon polyps 07/19/2024    Hiatal hernia 06/26/2024    Ground glass opacity present on imaging of lung 06/26/2024    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    Cough 02/10/2024    Bronchiectasis without complication (CMS HCC) 12/02/2023    OAB (overactive bladder) 12/02/2023    Stress incontinence 12/02/2023    Shoulder pain 07/29/2023    History of coronary angioplasty with insertion of stent 05/09/2023    Myocardial infarction 05/09/2023    Severe obesity (BMI 35.0-35.9 with comorbidity) (CMS HCC) 05/09/2023    Right hip pain 02/25/2021    Hand pain 02/25/2021    Cervical neck pain with evidence of disc disease 02/25/2021    Lumbar pain with radiation down right leg 02/25/2021    GAD (generalized anxiety disorder) 09/30/2020    Severe episode of recurrent major depressive disorder, 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) 09/30/2020    S/P thyroidectomy 04/05/2019    Pain due to neuropathy of facial nerve 07/18/2018    Neuropathy (CMS HCC) 05/04/2017    PTSD (post-traumatic stress disorder) 05/04/2017    History of  rheumatic fever 02/16/2017    Multiple pulmonary nodules determined by computed tomography of lung 02/23/2016    Dysuria 02/20/2016    Chest pain 02/19/2016    Squamous cell carcinoma 02/06/2015    Mass of parotid gland, 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    Otitis media of both ears 09/02/2013    Hyperlipidemia LDL goal < 100 08/29/2013    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        Details   ondansetron  4 mg Tablet  Commonly known as: ZOFRAN   What changed: reasons to take this   Take 1 Tablet (4 mg total) by mouth Every 8 hours as needed (or vomiting)  Qty: 30 Tablet  Refills: 11  CONTINUE these medications - NO CHANGES were made during your visit.        Details   acetaminophen  500 mg Tablet  Commonly known as: TYLENOL    Take 2 Tablets (1,000 mg total) by mouth Three times a day as needed for Pain  Qty: 90 Tablet  Refills: 1     albuterol  sulfate 90 mcg/actuation oral inhaler  Commonly known as: PROVENTIL  or VENTOLIN  or PROAIR    Take 1-2 Puffs by inhalation Every 6 hours as needed for Other (wheezing, shortness of breath)  Qty: 18 g  Refills: 11     aspirin  81 mg Tablet, Chewable   Chew 1 Tablet (81 mg total) Every morning  Refills: 0     baclofen  5 mg Tablet  Commonly known as: LIORESAL    Take 1 Tablet (5 mg total) by mouth Three times a day  Qty: 270 Tablet  Refills: 3     clobetasoL  0.05 % Ointment  Commonly known as: TEMOVATE    Apply topically Twice daily  Qty: 60  g  Refills: 3     desvenlafaxine  100 mg Tablet Sustained Release 24 hr  Commonly known as: PRISTIQ    Take 1 Tablet (100 mg total) by mouth Daily  Qty: 90 Tablet  Refills: 1     diclofenac  sodium 1 % Gel  Commonly known as: VOLTAREN    Apply topically Four times a day - before meals and bedtime  Qty: 100 g  Refills: 2     famotidine  40 mg Tablet  Commonly known as: PEPCID    Take 1 Tablet (40 mg total) by mouth Twice daily  Qty: 180 Tablet  Refills: 3     gabapentin  800 mg Tablet  Commonly known as: NEURONTIN    Take 1 Tablet (800 mg total) by mouth Twice daily Take 1 Tablet (800 mg total) by mouth Twice daily  Qty: 180 Tablet  Refills: 1     ipratropium bromide  42 mcg (0.06 %) Spray, Non-Aerosol  Commonly known as: ATROVENT    Administer 2 Sprays into affected nostril(s) Three times a day  Qty: 15 mL  Refills: 5     isosorbide  mononitrate 60 mg Tablet Sustained Release 24 hr  Commonly known as: IMDUR    Take 1 Tablet (60 mg total) by mouth Every morning  Qty: 90 Tablet  Refills: 3     metoprolol  succinate 25 mg Tablet Sustained Release 24 hr  Commonly known as: TOPROL -XL   Take 1 Tablet (25 mg total) by mouth Daily  Qty: 100 Tablet  Refills: 3     mirabegron  25 mg Tablet Sustained Release 24 hr  Commonly known as: Myrbetriq    Take 1 Tablet (25 mg total) by mouth Daily Take 1 Tablet (25 mg total) by mouth Daily  Qty: 90 Tablet  Refills: 1     nitroGLYCERIN  0.4 mg Tablet, Sublingual  Commonly known as: NITROSTAT    DISSOLVE 1 TABLET UNDER THE TONGUE AS NEEDED FOR CHEST PAIN EVERY 5 MINUTES UP TO 3 TIMES. IF NO RELIEF CALL 911.  Qty: 25 Tablet  Refills: 10     pantoprazole  40 mg Tablet, Delayed Release (E.C.)  Commonly known as: PROTONIX    Take 1 Tablet (40 mg total) by mouth Twice daily  Qty: 180 Tablet  Refills: 3     rosuvastatin  40 mg Tablet  Commonly known as: CRESTOR    Take 1 Tablet (40 mg total) by mouth Every evening  Qty: 100 Tablet  Refills: 3  STOP taking these medications.      erythromycin  5 mg/gram  (0.5 %) Ointment  Commonly known as: ROMYCIN            Discharge med list refreshed?  YES     Allergies[1]  HOSPITAL PROCEDURE(S):   No orders of the defined types were placed in this encounter.      REASON FOR HOSPITALIZATION AND HOSPITAL COURSE   BRIEF HPI:  This is a 68 y.o., female admitted for chest pain evaluation.   BRIEF HOSPITAL NARRATIVE:   Prachi Oftedahl is a 68 y.o. female with a past medical history of past medical history of coronary artery disease with myocardial infarction x1 s/p stents x3 gastroesophageal reflux disease, chronic pain, bladder irritability, mood disorder.     Patient presented to the ED with complaint of chest pain. Workup at that time was significant for trop less than 2.7, EKG unremarkable, CXR within normal limits, CT PE showing no pulmonary embolism, subpleural ground-glass left lower lobe focus which is decreased in size. Baseline lab work otherwise unremarkable. HEART score calculated, 5. [facility] accepted the patient for inpatient admission.     During the hospitalization, the patient underwent diagnostic evaluation including TTE and MPS which were unremarkable.  Over the course of hospitalization, the patient's clinical status was not changed. At time of discharge, the patient is afebrile, hemodynamically stable, tolerating diet, mobilizing without assistance.     On 09/19/2024 patient was deemed medically stable for discharge, voiced understanding of their diagnoses and treatment, and were agreeable to the plan for discharge and follow-up. All questions were answered.    TRANSITION/POST DISCHARGE CARE/PENDING TESTS/REFERRALS:   - No changes to chronic medications were made    CONDITION ON DISCHARGE:  A. Ambulation: Full ambulation  B. Self-care Ability: Complete  C. Cognitive Status Alert  D. Code status at discharge: FULL CODE      LINES/DRAINS/WOUNDS AT DISCHARGE:   Patient Lines/Drains/Airways Status       Active Line / Dialysis Catheter / Dialysis Graft / Drain /  Airway / Wound       Name Placement date Placement time Site Days    Wound  Incision Right Eye 12/07/22  --  -- 652                    DISCHARGE DISPOSITION:  Home discharge  DISCHARGE INSTRUCTIONS:  Post-Discharge Follow Up Appointments       Follow up with Family Medicine, Physician Office Center    Phone: [phone]    Where: [address], [place] [address]      Monday Sep 24, 2024    Return Patient Visit with Provider, Discharge Clinic Resident Family Med Poc at  1:30 PM      Wednesday Sep 26, 2024    New Patient Visit with Raeanne Finn, DO at  1:20 PM      Monday Oct 01, 2024    Return Patient Visit with Jama Rush, MD at  2:20 PM      Monday Oct 29, 2024    Physical with Ezra Quaker, PA-C at  6:40 PM      Wednesday Aug 14, 2025    Return Patient Visit with Zilphia Verlon Ade, MD at 10:30 AM      Allergy/Immunology, [facility], [place]  [address]  [place] [address]  [phone] Family Medicine, Physician Office Center  Physician Office Center, [facility]  [address] [facility]  [place] [address]  [phone] Family Medicine, [facility], [place]  [address] [facility] [address] [facility] [facility]  [place] [address]  [phone]    Ophthalmology, [facility]  [facility], [facility]  [address] [facility]  [place] [address]  [phone] Pulmonary, Physician [facility]  Physician [facility], [facility]  [address] [facility] [address]  [place] [address]  [phone]             US  KIDNEY           BASIC METABOLIC PANEL, FASTING     DISCHARGE INSTRUCTION - MISC    An appointment has been made for you at the [facility] Medicine Discharge Clinic on 09/24/2024 at 1:50pm.  Your appointment location is on the 3rd floor in the Physician's Office Center (POC) here at [facility]. On third floor, take left off of the elevator. Follow sign for [facility] Medicine Discharge Clinic.  If you will be unable to come to this appointment, please call [phone] to reschedule.     If you have any questions after you are discharged, during business hours, you can call one of the family medicine discharge nurses at [phone].  After hours, you can call [email or web address] [phone]) and ask to speak to the [facility] Medicine doctor on call.       PLEASE KEEP FOLLOW-UP APPT ALREADY SCHEDULED IN FAMILY MED-POC    Please keep your appointment with your follow-up provider. If for some reason you need to cancel, be sure to reschedule as this is important for your care.     Department FAMILY MED-POC [[id number]]           Bernardino Hint, MD    Copies sent to Care Team         Relationship Specialty Notifications Start End    Dalton, Kristi, PA-C PCP - General PHYSICIAN ASSISTANT Admissions 04/24/20     Phone: [phone] Fax: [phone]         [address] [facility] [address]    Reinhold No, MD PCP - Managed Care/Insurance FAMILY PRACTICE  04/29/20     Phone: [phone] Fax: [phone]         [address] [facility] [place] [place] [address]    Zilphia Verlon Ade, MD  OPHTHALMOLOGY Admissions 10/05/22     Phone: [phone] Fax: [phone]         [address] [facility] [address]    Merrie Bars, MD  UROLOGY Admissions 10/05/22     Phone: [phone] Fax: [phone]         [address] [place] [address]    Estelle Savant, MD  PSYCHIATRY Admissions 10/05/22     Phone: [phone] Fax: [phone]         [address] [facility] [address]    Niki Alm HERO, MD  ORTHOPAEDIC SURGERY  12/02/23     Pain Clinic    Phone: [phone] Fax: [phone]         [address] [place] [address]            Referring providers can utilize [email or web address] to access their referred [facility] Medicine patient's information.              I saw and examined the patient.  I reviewed the resident's note.  I agree with the findings and plan  of care as documented in the resident's note.  Any exceptions/additions are  edited/noted.    Dasie Bott, MD                   [1]   Allergies  Allergen Reactions    Cymbalta  [Duloxetine ]      Rash on feet and hands    Blue Dye      Told to avoid due to patch test result by Elita LUNA    Flavoring Agent  Other Adverse Reaction (Add comment)     Patient unsure of reaction. Found on skin test    Lipitor [Atorvastatin ] Myalgia     Muscle pain    Lisinopril  Other Adverse Reaction (Add comment)     Cough      Nickel      Blisters  Allergic to all kinds of metal     Codeine  Itching     RASH    Mobic  [Meloxicam ] Nausea/ Vomiting     Nervous and shakes     Naprosyn  [Naproxen ] Nausea/ Vomiting    Nsaids (Non-Steroidal Anti-Inflammatory Drug) Nausea/ Vomiting

## 2024-09-19 NOTE — Care Plan (Signed)
 Problem: Wound  Goal: Optimal Coping  Outcome: Ongoing (see interventions/notes)  Intervention: Support Patient and Family Response  Recent Flowsheet Documentation  Taken 09/18/2024 2330 by Curlee DEL, RN  Family/Support System Care:   involvement promoted   presence promoted   self-care encouraged   support provided  Supportive Measures:   active listening utilized   self-care encouraged   self-reflection promoted   self-responsibility promoted   verbalization of feelings encouraged   relaxation techniques promoted   positive reinforcement provided   problem-solving facilitated  Goal: Optimal Functional Ability  Outcome: Ongoing (see interventions/notes)  Intervention: Optimize Functional Ability  Recent Flowsheet Documentation  Taken 09/18/2024 2330 by Curlee DEL, RN  Activity Management:   ROM, active encouraged   patient refused/activity encouraged  Activity Assistance Provided: assistance, stand-by  Goal: Absence of Infection Signs and Symptoms  Outcome: Ongoing (see interventions/notes)  Intervention: Prevent or Manage Infection  Recent Flowsheet Documentation  Taken 09/18/2024 2330 by Curlee DEL, RN  Fever Reduction/Comfort Measures:   lightweight clothing   lightweight bedding  Isolation Precautions: protective environment precautions maintained  Goal: Improved Oral Intake  Outcome: Ongoing (see interventions/notes)  Goal: Optimal Pain Control and Function  Outcome: Ongoing (see interventions/notes)  Intervention: Prevent or Manage Pain  Recent Flowsheet Documentation  Taken 09/18/2024 2330 by Curlee DEL, RN  Sleep/Rest Enhancement:   awakenings minimized   natural light exposure provided   reading promoted   regular sleep/rest pattern promoted   relaxation techniques promoted   room darkened   therapeutic touch utilized   noise level reduced  Goal: Skin Health and Integrity  Outcome: Ongoing (see interventions/notes)  Intervention: Optimize Skin Protection  Recent Flowsheet Documentation  Taken 09/18/2024 2330 by Curlee DEL,  RN  Pressure Reduction Techniques:   Patient turned q 2 hours   Mobility is maximized   Frequent weight shifting encouraged  Pressure Reduction Devices:   Pressure redistributing mattress utilized   Sacral dressing utilized if not incontinent of bowel   Pressure reduction chair cushion utilized   Specialty bed/mattress utilized   Repositioning wedges/pillows utilized  Activity Management:   ROM, active encouraged   patient refused/activity encouraged  Head of Bed (HOB) Positioning: HOB at 30-45 degrees  Goal: Optimal Wound Healing  Outcome: Ongoing (see interventions/notes)  Intervention: Promote Wound Healing  Recent Flowsheet Documentation  Taken 09/18/2024 2330 by Curlee DEL, RN  Pressure Reduction Techniques:   Patient turned q 2 hours   Mobility is maximized   Frequent weight shifting encouraged  Pressure Reduction Devices:   Pressure redistributing mattress utilized   Sacral dressing utilized if not incontinent of bowel   Pressure reduction chair cushion utilized   Specialty bed/mattress utilized   Repositioning wedges/pillows utilized  Sleep/Rest Enhancement:   awakenings minimized   natural light exposure provided   reading promoted   regular sleep/rest pattern promoted   relaxation techniques promoted   room darkened   therapeutic touch utilized   noise level reduced  Activity Management:   ROM, active encouraged   patient refused/activity encouraged     Problem: Adult Inpatient Plan of Care  Goal: Absence of Hospital-Acquired Illness or Injury  Outcome: Ongoing (see interventions/notes)  Intervention: Identify and Manage Fall Risk  Recent Flowsheet Documentation  Taken 09/18/2024 2330 by Curlee DEL, RN  Safety Promotion/Fall Prevention:   activity supervised   fall prevention program maintained   motion sensor pad activated   safety round/check completed   nonskid shoes/slippers when out of bed  Intervention: Prevent Skin Injury  Recent Flowsheet Documentation  Taken 09/18/2024 2330 by Curlee DEL, RN  Body Position:    supine, head elevated   neutral body alignment   neutral head position   neutral head position, midline maintained  Skin Protection:   adhesive use limited   tubing/devices free from skin contact   pulse oximeter probe site changed   skin sealant/moisture barrier applied   skin-to-device areas padded   skin-to-skin areas padded   transparent dressing maintained  Intervention: Prevent and Manage VTE (Venous Thromboembolism) Risk  Recent Flowsheet Documentation  Taken 09/18/2024 2330 by Curlee DEL, RN  VTE Prevention/Management:   dorsiflexion/plantar flexion performed   ambulation promoted  Intervention: Prevent Infection  Recent Flowsheet Documentation  Taken 09/18/2024 2330 by Curlee DEL, RN  Infection Prevention:   barrier precautions utilized   environmental surveillance performed   equipment surfaces disinfected   glycemic control managed   personal protective equipment utilized   rest/sleep promoted   promote handwashing   visitors restricted/screened  Goal: Optimal Comfort and Wellbeing  Outcome: Ongoing (see interventions/notes)  Intervention: Provide Person-Centered Care  Recent Flowsheet Documentation  Taken 09/18/2024 2330 by Curlee DEL, RN  Trust Relationship/Rapport:   care explained   emotional support provided   empathic listening provided   questions answered   reassurance provided   thoughts/feelings acknowledged   questions encouraged  Goal: Rounds/Family Conference  Outcome: Ongoing (see interventions/notes)     Problem: Depression  Goal: Decreased Depression Symptoms  Outcome: Ongoing (see interventions/notes)  Intervention: Monitor and Manage Depressive Symptoms  Recent Flowsheet Documentation  Taken 09/18/2024 2330 by Curlee DEL, RN  Family/Support System Care:   involvement promoted   presence promoted   self-care encouraged   support provided  Supportive Measures:   active listening utilized   self-care encouraged   self-reflection promoted   self-responsibility promoted   verbalization of feelings  encouraged   relaxation techniques promoted   positive reinforcement provided   problem-solving facilitated     Problem: Health Knowledge, Opportunity to Enhance (Adult,Obstetrics,Pediatric)  Goal: Knowledgeable about Health Subject/Topic  Description: Patient will demonstrate the desired outcomes by discharge/transition of care.  Outcome: Ongoing (see interventions/notes)  Intervention: Enhance Health Knowledge  Recent Flowsheet Documentation  Taken 09/18/2024 2330 by Curlee DEL, RN  Family/Support System Care:   involvement promoted   presence promoted   self-care encouraged   support provided  Supportive Measures:   active listening utilized   self-care encouraged   self-reflection promoted   self-responsibility promoted   verbalization of feelings encouraged   relaxation techniques promoted   positive reinforcement provided   problem-solving facilitated     Problem: Pain Acute  Goal: Optimal Pain Control and Function  Outcome: Ongoing (see interventions/notes)  Intervention: Optimize Psychosocial Wellbeing  Recent Flowsheet Documentation  Taken 09/18/2024 2330 by Curlee DEL, RN  Diversional Activities:   television   smartphone  Supportive Measures:   active listening utilized   self-care encouraged   self-reflection promoted   self-responsibility promoted   verbalization of feelings encouraged   relaxation techniques promoted   positive reinforcement provided   problem-solving facilitated  Intervention: Prevent or Manage Pain  Recent Flowsheet Documentation  Taken 09/18/2024 2330 by Curlee DEL, RN  Sleep/Rest Enhancement:   awakenings minimized   natural light exposure provided   reading promoted   regular sleep/rest pattern promoted   relaxation techniques promoted   room darkened   therapeutic touch utilized   noise level reduced  Medication Review/Management:   medications reviewed  high-risk medications identified     Problem: Bed Safety Risk  Goal: Identify Risk Factors  Outcome: Ongoing (see  interventions/notes)  Goal: Bed Safety (Falls)  Description: Interventions:  1. Assess need for bed rails  2. Assist with self-care  3. Bottom rail down for egress  4. Top rail up for bed mobility/transfers  5. Keep bed in lowest position with wheels locked  6. Keep needed items within reach  Outcome: Ongoing (see interventions/notes)  Goal: Bed Safety (Entrapment)  Description: Interventions:  1. Assess need for bed rails  2. Assess risk of entanglement  3. User proper size mattress to prevent entrapment  4. Reduce gaps between mattress and rail  5. Use devices (bed rail pads/bolsters) as needed  Outcome: Ongoing (see interventions/notes)     Problem: Fall Injury Risk  Goal: Absence of Fall and Fall-Related Injury  Outcome: Ongoing (see interventions/notes)  Intervention: Identify and Manage Contributors  Recent Flowsheet Documentation  Taken 09/18/2024 2330 by Curlee DEL, RN  Medication Review/Management:   medications reviewed   high-risk medications identified  Intervention: Promote Injury-Free Environment  Recent Flowsheet Documentation  Taken 09/18/2024 2330 by Curlee DEL, RN  Safety Promotion/Fall Prevention:   activity supervised   fall prevention program maintained   motion sensor pad activated   safety round/check completed   nonskid shoes/slippers when out of bed     Problem: Skin Injury Risk Increased  Goal: Skin Health and Integrity  Outcome: Ongoing (see interventions/notes)  Intervention: Optimize Skin Protection  Recent Flowsheet Documentation  Taken 09/18/2024 2330 by Curlee DEL, RN  Pressure Reduction Techniques:   Patient turned q 2 hours   Mobility is maximized   Frequent weight shifting encouraged  Pressure Reduction Devices:   Pressure redistributing mattress utilized   Sacral dressing utilized if not incontinent of bowel   Pressure reduction chair cushion utilized   Specialty bed/mattress utilized   Repositioning wedges/pillows utilized  Skin Protection:   adhesive use limited   tubing/devices free from  skin contact   pulse oximeter probe site changed   skin sealant/moisture barrier applied   skin-to-device areas padded   skin-to-skin areas padded   transparent dressing maintained  Activity Management:   ROM, active encouraged   patient refused/activity encouraged  Head of Bed (HOB) Positioning: HOB at 30-45 degrees     Problem: Dysrhythmia  Goal: Normalized Cardiac Rhythm  Outcome: Ongoing (see interventions/notes)  Intervention: Monitor and Manage Cardiac Rhythm Effect  Recent Flowsheet Documentation  Taken 09/18/2024 2330 by Curlee DEL, RN  VTE Prevention/Management:   dorsiflexion/plantar flexion performed   ambulation promoted     Problem: Chest Pain  Goal: Resolution of Chest Pain Symptoms  Outcome: Ongoing (see interventions/notes)

## 2024-09-21 ENCOUNTER — Telehealth (HOSPITAL_BASED_OUTPATIENT_CLINIC_OR_DEPARTMENT_OTHER): Payer: Self-pay | Admitting: Physician Assistant

## 2024-09-21 ENCOUNTER — Other Ambulatory Visit (INDEPENDENT_AMBULATORY_CARE_PROVIDER_SITE_OTHER): Payer: Self-pay

## 2024-09-21 NOTE — Telephone Encounter (Signed)
 Transition of Care Contact Information  Discharge Date: 09/19/2024  Transition Facility Type--Hospital (Inpatient or Observation)  Facility Name--[facility]  Interactive Contact(s): Completed or attempted contact indicated by Date/Time  Completed Contact: 09/21/2024  4:13 PM  Contact Method(s)-- Patient/Caregiver Telephone  Clinical Staff Name/Role who [name]  Transition Assessment  Discharge Summary obtained?--Yes  How are you recovering?--Improving  Discharge Meds obtained?--Yes  Discharge medication changes reviewed?  Full Medication Reconciliation Completed?  Medication Concerns?--No  Have everything needed for recovery?--Yes  Care Coordination:   Patient has transition follow-up appointment date and time?--Yes  Follow up appointment date:--09/24/2024  Specialist Transition Visit planned?  Specialist Transition Visit date:  Patient/caregiver plans to attend transition visit?--Yes  Primary Follow-up Barrier  Interventions provided --patient expresses understanding of follow-up plan  Home Health or DME ordered at discharge?--No  Clinician/Team notified?--No  Primary reason clinician notified?  Transition Note:Spoke to patient regarding hospital discharge from [facility].    Patient is doing well after discharge.  Patient denies pain.  She does state that her GERD has been worse.  Patient has been taking Prilosec and Protonix  with no relief.  States it is worse when laying down.  Encouraged to prop with pillows and to remain upright 30 minutes after eating.  Denies SOB, N/V, CP  Eating and drinking without difficulty.    Voiding and Stooling appropriately.      Patient has f/u appt with DC clinic on 12/1  All questions and concerns addressed and encouraged patient to reach out with any further needs.  Mercy Blush RN

## 2024-09-23 ENCOUNTER — Other Ambulatory Visit: Payer: Self-pay

## 2024-09-24 ENCOUNTER — Ambulatory Visit: Payer: Self-pay

## 2024-09-24 VITALS — BP 127/70 | HR 81 | Temp 97.3°F | Resp 16 | Ht 65.0 in | Wt 229.5 lb

## 2024-09-24 DIAGNOSIS — Z09 Encounter for follow-up examination after completed treatment for conditions other than malignant neoplasm: Secondary | ICD-10-CM

## 2024-09-24 DIAGNOSIS — R079 Chest pain, unspecified: Secondary | ICD-10-CM

## 2024-09-24 DIAGNOSIS — K219 Gastro-esophageal reflux disease without esophagitis: Secondary | ICD-10-CM

## 2024-09-24 MED ORDER — SUCRALFATE 100 MG/ML ORAL SUSPENSION: 1.0000 g | Freq: Four times a day (QID) | ORAL | 3 refills | Status: AC | PRN

## 2024-09-24 NOTE — Progress Notes (Signed)
 FAMILY [facility]YS[facility]  [address]  [place] [address]  Operated by [facility]  Transition of Care Management Note    [name]  [id number]  1956/04/30  Date of Encounter: 09/24/2024     Transition of Care Contact Information  Discharge date: Discharge Date: 09/19/2024  Transition Facility Type--Hospital (Inpatient or Observation)  Facility Name--[facility] Interactive Contact(s):  Completed Contact: 09/21/2024  4:13 PM  Contact Method(s)-- Patient/Caregiver Telephone  Clinical Staff Name/Role who [name]     Data Reviewed  Medication Reconciliation completed      Chief Complaint   Patient presents with    Hospital Discharge Transition        HPI  [name] is a 68 y.o. female presenting today for follow-up after being discharged. Patient was admitted on 11/25 due to chest pain with history of stent x 3. Troponin was negative x 2, EKG baseline, CXR without acute changes. TTE and MPS performed during hospitalization. TTE with LVEF 56%, no changes from prior TTE. MPS negative. She was discharged on 09/19/24. Since admission patient has not had anymore chest pain. However, reports her acid reflux has been worse over the holiday. With any amount or type of food she has reflux that keeps her awake. She is taking Protonix  and Pepcid  BID already. Recently had EGD and pH impedence that were positive for reflux. Plans to follow up with GI for surgical options.     Medication Reconciliation was performed today.    Medical and Surgical History reviewed with patient today; any changes have been updated in the EMR.     REVIEW OF SYSTEMS: As discussed above in the HPI; otherwise all other systems negative.    LABS/IMAGING  I personally reviewed the labs and imaging obtained during admission and discussed with patient.  Lab Results   Component Value Date    WBC 6.3 09/19/2024    HGB 12.0 09/19/2024    SODIUM 140 09/19/2024    CREATININE  1.25 (H) 09/19/2024    POTASSIUM 3.9 09/19/2024     Recent Results (from the past 720 hours)   ECG 12-LEAD    Collection Time: 09/18/24  9:16 PM   Result Value Ref Range    Ventricular rate 72 BPM    Atrial Rate 72 BPM    PR Interval 164 ms    QRS Duration 82 ms    QT Interval 400 ms    QTC Calculation 438 ms    Calculated P Axis 23 degrees    Calculated R Axis -11 degrees    Calculated T Axis -6 degrees   ECG 12-LEAD    Collection Time: 09/18/24  1:01 PM   Result Value Ref Range    Ventricular rate 96 BPM    Atrial Rate 96 BPM    PR Interval 158 ms    QRS Duration 76 ms    QT Interval 340 ms    QTC Calculation 429 ms    Calculated P Axis 26 degrees    Calculated R Axis 10 degrees    Calculated T Axis -8 degrees       ASSESSMENT/PLAN  [name] 69 y.o. female seen today for initial visit after discharge.  Final discharge diagnosis: chest pain  AMB DC Diagnosis Risk: severe exacerbation requiring hospital admission  - I have reviewed the patient's discharge summary.   - Medications were reconciled and appropriate medication education was provided.   -  Appropriate education about the admission and care coordination was also provided today.  Other transition actions (Optional) -: Discharge documentation was reviewed and No pending tests or treatments.    Underlying comorbid conditions increasing complexity of patient care include: CAD, GERD, chronic pain, mood disorder.  Changes made to comorbid condition management:  - added Carafate  to GERD regimen, recommend f/u with GI       ICD-10-CM    1. Hospital discharge follow-up  Z09       2. Chest pain  R07.9       3. GERD (gastroesophageal reflux disease)  K21.9         Add Carafate  Q6H PRN to Pepcid  and Protonix  regimen   Schedule follow up with GI   Continue to follow with cardiology as needed       Barbara Duhamel, MD  Department of Family Medicine   PGY-3  09/24/2024 13:35      I saw and examined the patient.  I reviewed the resident's note.  I agree with  the findings and plan of care as documented in the resident's note.  Any exceptions/additions are edited/noted.    Barbara M Oreskovich, DO

## 2024-09-25 ENCOUNTER — Other Ambulatory Visit: Payer: Self-pay

## 2024-09-26 ENCOUNTER — Ambulatory Visit: Payer: Self-pay | Attending: PEDIATRIC MEDICINE | Admitting: PEDIATRIC MEDICINE

## 2024-09-26 ENCOUNTER — Encounter (HOSPITAL_BASED_OUTPATIENT_CLINIC_OR_DEPARTMENT_OTHER): Payer: Self-pay | Admitting: PEDIATRIC MEDICINE

## 2024-09-26 ENCOUNTER — Ambulatory Visit (HOSPITAL_BASED_OUTPATIENT_CLINIC_OR_DEPARTMENT_OTHER): Admitting: Gynecology

## 2024-09-26 VITALS — BP 123/76 | HR 95 | Temp 97.1°F | Resp 20 | Ht 65.0 in | Wt 229.5 lb

## 2024-09-26 DIAGNOSIS — R053 Chronic cough: Secondary | ICD-10-CM

## 2024-09-26 DIAGNOSIS — K449 Diaphragmatic hernia without obstruction or gangrene: Secondary | ICD-10-CM

## 2024-09-26 DIAGNOSIS — R490 Dysphonia: Secondary | ICD-10-CM

## 2024-09-26 DIAGNOSIS — J479 Bronchiectasis, uncomplicated: Secondary | ICD-10-CM

## 2024-09-26 DIAGNOSIS — D803 Selective deficiency of immunoglobulin G [IgG] subclasses: Secondary | ICD-10-CM

## 2024-09-26 DIAGNOSIS — J383 Other diseases of vocal cords: Secondary | ICD-10-CM

## 2024-09-26 DIAGNOSIS — B999 Unspecified infectious disease: Secondary | ICD-10-CM

## 2024-09-26 DIAGNOSIS — J3489 Other specified disorders of nose and nasal sinuses: Secondary | ICD-10-CM

## 2024-09-26 DIAGNOSIS — R918 Other nonspecific abnormal finding of lung field: Secondary | ICD-10-CM

## 2024-09-26 DIAGNOSIS — R0982 Postnasal drip: Secondary | ICD-10-CM

## 2024-09-26 DIAGNOSIS — R059 Cough, unspecified: Secondary | ICD-10-CM

## 2024-09-26 DIAGNOSIS — Z832 Family history of diseases of the blood and blood-forming organs and certain disorders involving the immune mechanism: Secondary | ICD-10-CM

## 2024-09-26 DIAGNOSIS — Z23 Encounter for immunization: Secondary | ICD-10-CM

## 2024-09-26 DIAGNOSIS — K219 Gastro-esophageal reflux disease without esophagitis: Secondary | ICD-10-CM

## 2024-09-26 NOTE — Progress Notes (Signed)
 ALLERGY/IMMUNOLOGY, [facility]  [address]  [place] [address]  New Office Visit    Name: [name]   DOB: 1956/10/02  Date of Service: 09/26/2024    Chief Complaint(s)  Chief Complaint   Patient presents with    Immunodefiency         HPI    [name] is a 68 y.o. female with PMH of bronchiectasis, pulmonary nodules, CAD (MI in Summer 2024), neuropathy, migraines, multinodular goiter s/p thyroidectomy, HLD, GERD, and hiatal hernia who presents to Allergy/Immunology clinic today for recurrent infections.     [name] was referred by Barbara Bast, MD. Patient provides the history.     Recurrent Infections  Anarely has for recurrent infections for about 40 years. Infections have included viral URIs, sinusitis, otitis media, bronchitis, pneumonia, and UTIs. She denies SSTI and GI infections. They occur irregularly about 1-2 times a month. Infections were worse prior to smoking cessation in 2006. She often gets sick longer and more severely than other people. She has been prescribed 5-7 courses of antibiotics in the last year. She has been hospitalized for an eye infection (uncertain what). There is a family history of recurrent infections in mother and sister. [name] is unsure about poor wound healing but admits chronic fatigue and chronic arthralgia/myalgias. Lovie notes some rhinorrhea at baseline, but only with eating. Atrovent  nasal spray helps. 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 Today, [name] also notes a hoarse voice, trouble speaking, cough, and globus sensation.     Review of Systems (bold are positive)  Constitutional: fevers, chills, night sweats, fatigue, weight loss/gain  Eyes: discharge, pruritis, erythema  ENT: congestion, rhinorrhea, headaches, sore throat, hearing/vision changes  Heart: chest pain, palpitations, presyncope/syncope  Lungs: shortness of breath, cough, wheezing, dyspnea on  exertion.   Abdomen:  nausea, vomiting, abdominal pain, diarrhea, constipation  Skin: rashes, jaundice, lesions  MSK: joint swelling, joint redness, arthralgias, myalgias, deformities  Heme/lymph: bleeding, lymphadenopathy, petechiae, ecchymosis  Neuro: numbness, tingling, focal weakness      Past Medical History  Patient Active Problem List    Diagnosis Date Noted    Elevated serum creatinine 09/19/2024    Other chronic pain 09/19/2024    Bladder irritation 09/19/2024    Screening for colorectal cancer 07/19/2024    Personal history of adenomatous and serrated colon polyps 07/19/2024    Hiatal hernia 06/26/2024    Ground glass opacity present on imaging of lung 06/26/2024    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    Cough 02/10/2024    Bronchiectasis without complication (CMS HCC) 12/02/2023    OAB (overactive bladder) 12/02/2023    Stress incontinence 12/02/2023    Shoulder pain 07/29/2023    History of coronary angioplasty with insertion of stent 05/09/2023    Myocardial infarction 05/09/2023    Severe obesity (BMI 35.0-35.9 with comorbidity) (CMS HCC) 05/09/2023    Right hip pain 02/25/2021     Added automatically from request for surgery [id number]      Hand pain 02/25/2021    Cervical neck pain with evidence of disc disease 02/25/2021    Lumbar pain with radiation down right leg 02/25/2021    GAD (generalized anxiety disorder) 09/30/2020    Severe episode of recurrent major depressive disorder, 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) 09/30/2020    S/P thyroidectomy 04/05/2019    Pain due to neuropathy of facial nerve 07/18/2018    Neuropathy (CMS HCC)  05/04/2017    PTSD (post-traumatic stress disorder) 05/04/2017    History of rheumatic fever 02/16/2017    Multiple pulmonary nodules determined by computed tomography of lung 02/23/2016     Found incidentally on CT scan 4/27, repeat in one year advised      Dysuria 02/20/2016    Chest pain 02/19/2016    Squamous cell carcinoma 02/06/2015    Mass of parotid  gland, 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     Incidental finding on MRI of brain      Otitis media of both ears 09/02/2013    Hyperlipidemia LDL goal < 100 08/29/2013    Coronary artery disease involving native coronary artery of native heart with angina pectoris (CMS [facility]) 11/10/2012     Noted on cath Jan 2014, 20% luminal irregularities only      Left knee pain 10/04/2012     X-ray Tibia and Fibula demonstrates,  no definite evidence for acute fracture or dislocation. On the lateral view there is an oblique lucency through the fibular neck which may be artifactual, as it is only seen on the single image and not seen on the frontal view or on the knee examination.    -Will consider consult placed to Sports Medicine for evaluation/recommendations.        Osteoarthritis, knee 09/07/2011    Carpal tunnel syndrome on right 12/15/2010    Lichen sclerosus et atrophicus of the vulva 06/04/2010    Neck pain 02/11/2010    Microscopic hematuria 10/10/2008     Cystoscopy 2004 reassuring      Multinodular goiter       US   9/11--no change from 2009  Last TSH in 10/2010- 1.02  Will repeat given history      Patellofemoral pain syndrome 05/03/2008    GERD not well controlled 07/23/2004     EGD done by Dr Jolanda 12/19/07 nonerosive GERD, erythema and patulous GE junction, continue PPI and antireflux measures.    Esophageal manometry 01/03/08 essentially normal.   PH probe testing--abnormal off PPI  Recently discontinued Prilosec, currently stable with lifestyle changes.      Migraine Headaches 02/28/2001       Family History  Family History   Problem Relation Age of Onset    Diabetes Mother     High Cholesterol Mother     Hypertension (High Blood Pressure) Mother     Coronary Artery Disease Mother 36        CABG    Congestive Heart Failure Father 55        died at 7    Coronary Artery Disease Father     Heart Attack Father     Heart Attack Sister         36    Kidney Disease Sister         ESRD    Thyroid  Disease Sister     Heart Attack Sister      Hypertension (High Blood Pressure) Sister     Hypertension (High Blood Pressure) Sister     Hypertension (High Blood Pressure) Brother     Stroke Paternal Grandmother     Bipolar Disorder Daughter     Cancer Paternal Aunt         Breast Ca    Breast Cancer Paternal Aunt     Cancer Paternal Aunt         Breast Ca    Breast Cancer Paternal Aunt     Cancer Paternal Aunt  Breast Ca    Breast Cancer Paternal Aunt     Cancer Paternal Aunt         Breast Ca    Breast Cancer Paternal Aunt     Cancer Paternal Aunt         Breast Ca    Breast Cancer Paternal Aunt     Leukemia Paternal Uncle     Bipolar Disorder Paternal Uncle         bone    Cancer Other 62        Breast Ca/Breast Ca    Breast Cancer Other        Medications  Current Outpatient Medications   Medication Sig    acetaminophen  (TYLENOL ) 500 mg Oral Tablet Take 2 Tablets (1,000 mg total) by mouth Three times a day as needed for Pain    albuterol  sulfate (PROVENTIL  OR VENTOLIN  OR PROAIR ) 90 mcg/actuation Inhalation oral inhaler Take 1-2 Puffs by inhalation Every 6 hours as needed for Other (wheezing, shortness of breath) (Patient taking differently: Take 1-2 Puffs by inhalation Every 6 hours as needed for Other (wheezing, shortness of breath) 1-2 times a week)    aspirin  81 mg Oral Tablet, Chewable Chew 1 Tablet (81 mg total) Every morning    baclofen  (LIORESAL ) 5 mg Oral Tablet Take 1 Tablet (5 mg total) by mouth Three times a day (Patient taking differently: Take 1 Tablet (5 mg total) by mouth Three times a day as needed)    cetirizine  (ZYRTEC ) 10 mg Oral Tablet Take 1 Tablet (10 mg total) by mouth Once per day as needed for Allergies    clobetasoL  (TEMOVATE ) 0.05 % Ointment Apply topically Twice daily    desvenlafaxine  (PRISTIQ ) 100 mg Oral Tablet Sustained Release 24 hr Take 1 Tablet (100 mg total) by mouth Daily    diclofenac  sodium (VOLTAREN ) 1 % Gel Apply topically Four times a day - before meals and bedtime (Patient taking differently: Apply  topically Four times a day as needed)    famotidine  (PEPCID ) 40 mg Oral Tablet Take 1 Tablet (40 mg total) by mouth Twice daily    gabapentin  (NEURONTIN ) 800 mg Oral Tablet Take 1 Tablet (800 mg total) by mouth Twice daily Take 1 Tablet (800 mg total) by mouth Twice daily    ipratropium bromide  (ATROVENT ) 42 mcg (0.06 %) Nasal Spray, Non-Aerosol Administer 2 Sprays into affected nostril(s) Three times a day    isosorbide  mononitrate (IMDUR ) 60 mg Oral Tablet Sustained Release 24 hr Take 1 Tablet (60 mg total) by mouth Every morning    metoprolol  succinate (TOPROL -XL) 25 mg Oral Tablet Sustained Release 24 hr Take 1 Tablet (25 mg total) by mouth Daily    mirabegron  (MYRBETRIQ ) 25 mg Oral Tablet Sustained Release 24 hr Take 1 Tablet (25 mg total) by mouth Daily Take 1 Tablet (25 mg total) by mouth Daily    nitroGLYCERIN  (NITROSTAT ) 0.4 mg Sublingual Tablet, Sublingual DISSOLVE 1 TABLET UNDER THE TONGUE AS NEEDED FOR CHEST PAIN EVERY 5 MINUTES UP TO 3 TIMES. IF NO RELIEF CALL 911.    ondansetron  (ZOFRAN ) 4 mg Oral Tablet Take 1 Tablet (4 mg total) by mouth Every 8 hours as needed (or vomiting)    pantoprazole  (PROTONIX ) 40 mg Oral Tablet, Delayed Release (E.C.) Take 1 Tablet (40 mg total) by mouth Twice daily    rosuvastatin  (CRESTOR ) 40 mg Oral Tablet Take 1 Tablet (40 mg total) by mouth Every evening    sucralfate  (CARAFATE ) 100 mg/mL Oral Suspension Take  10 mL (1 g total) by mouth Every 6 hours as needed        Allergies:  Allergies[1]     Social History  Social History[2]         OBJECTIVE  BP 123/76   Pulse 95   Temp 36.2 C (97.1 F)   Resp 20   Ht 1.651 m (5' 5)   Wt 104 kg (229 lb 8 oz)   SpO2 97%   BMI 38.19 kg/m     Physical Exam  Constitutional: Patient is well appearing, in no acute distress, resting comfortably.  HEENT: Normocephalic and atraumatic, no rhinorrhea, turbinates without edema or erythema, pupils equal, conjunctiva clear, no icterus, moist mucus membranes, posterior oropharynx without  erythema or post nasal drip, tonsils present and without erythema or exudate  Neck: Trachea midline, neck supple, no lymphadenopathy  Heart: RRR, S1 and S2 audible, no murmurs, rubs, or gallops  Lungs: CTA bilaterally, no wheezing, crackles, or rhonchi  Abdomen: Bowel sounds present, abdomen soft, non-distended, and non-tender to palpation.  Skin: Warm and dry, no rashes/lesions on exposed skin  Extremities: No cyanosis, edema, or clubbing.  Neuro: Alert and answering questions appropriately, moving all four extremities.  Psych: Appropriate mood and affect.           ASSESSMENT/PLAN    ICD-10-CM    1. Vocal cord dysfunction  J38.3       2. Chronic cough  R05.3 RAST, BERMUDA GRASS (G2)     RAST, MEADOW GRASS ([place]  BLUE) (G8)     RAST, PECAN, HICKORY (T22)     RAST, ELM (T8)     RAST, GOLDEN ROD (W12)     RAST, GIANT RAGWEED (W3)     RAST, MEADOW FESCUE GRASS (G4)     RAST, COTTONWOOD POPLAR (T14)     RAST, AMERICAN BEECH (T5)     RAST, COMMON RAGWEED (W1)     RAST, COCKLEBUR (W13)     RAST, PLANTAIN, ENGLISH (W9)     RAST, TIMOTHY GRASS (G6)     RAST, COMMON SILVER BIRCH (T3)     RAST, [place] (T7)     RAST, GOOSE FOOT (LAMB'S QUARTER) (W10)     RAST, SHEEP SORREL (W18)     RAST, ASPERGILLUS FUMIGATUS (M3)     RAST, CLADOSPORIUM HERBARUM (M2)     RAST, ALTERNARIA ALTERNATA (M6)     RAST, PENICILLIUM CHRYSOGENUM (M1)     RAST, HOUSE DUST MITE (D1) (DERMATOPHAGOIDES PTERONYSSINUS)     RAST, HOUSE DUST MITE (D2) (DERMATOPHAGOIDES FARINAE)     RAST, CAT DANDER (E1)     RAST, DOG DANDER (E5)      3. Bronchiectasis without complication (CMS HCC)  J47.9 IMMUNOGLOBULIN G SUBCLASSES PANEL     STREPTOCOCCUS PNEUMONIAE IGG ANTIBODIES, 23 SEROTYPES, SERUM     TETANUS TOXOID IGG ANTIBODY ASSAY, SERUM     MANNOSE BINDING LECTIN     ALPHA-1-ANTITRYPSIN PHENOTYPE, SERUM      4. IgG2 subclass deficiency (CMS HCC)  D80.3 IMMUNOGLOBULIN G SUBCLASSES PANEL     STREPTOCOCCUS PNEUMONIAE IGG ANTIBODIES, 23 SEROTYPES, SERUM     TETANUS  TOXOID IGG ANTIBODY ASSAY, SERUM     MANNOSE BINDING LECTIN      5. Ground glass opacity present on imaging of lung  R91.8       6. Multiple pulmonary nodules determined by computed tomography of lung  R91.8       7. Gastroesophageal reflux disease, unspecified whether esophagitis present  K21.9       8. Hiatal hernia  K44.9       9. Severe obesity (BMI 35.0-35.9 with comorbidity) (CMS HCC)  E66.01     Z68.35       10. Needs flu shot  Z23           Orders Placed This Encounter    IMMUNOGLOBULIN G SUBCLASSES PANEL    STREPTOCOCCUS PNEUMONIAE IGG ANTIBODIES, 23 SEROTYPES, SERUM    TETANUS TOXOID IGG ANTIBODY ASSAY, SERUM    MANNOSE BINDING LECTIN    ALPHA-1-ANTITRYPSIN PHENOTYPE, SERUM    RAST, BERMUDA GRASS (G2)    RAST, MEADOW GRASS ([place]  BLUE) (G8)    RAST, PECAN, HICKORY (T22)    RAST, ELM (T8)    RAST, GOLDEN ROD (W12)    RAST, GIANT RAGWEED (W3)    RAST, MEADOW FESCUE GRASS (G4)    RAST, COTTONWOOD POPLAR (T14)    RAST, AMERICAN BEECH (T5)    RAST, COMMON RAGWEED (W1)    RAST, COCKLEBUR (W13)    RAST, PLANTAIN, ENGLISH (W9)    RAST, TIMOTHY GRASS (G6)    RAST, COMMON SILVER BIRCH (T3)    RAST, [place] (T7)    RAST, GOOSE FOOT (LAMB'S QUARTER) (W10)    RAST, SHEEP SORREL (W18)    RAST, ASPERGILLUS FUMIGATUS (M3)    RAST, CLADOSPORIUM HERBARUM (M2)    RAST, ALTERNARIA ALTERNATA (M6)    RAST, PENICILLIUM CHRYSOGENUM (M1)    RAST, HOUSE DUST MITE (D1) (DERMATOPHAGOIDES PTERONYSSINUS)    RAST, HOUSE DUST MITE (D2) (DERMATOPHAGOIDES FARINAE)    RAST, CAT DANDER (E1)    RAST, DOG DANDER (E5)       Recurrent Infections  Low IgG2 Subclass  Bronchiectasis  - History and previous testing is concerning for an immunodeficiency, differential includes GERD and other causes of bronchiectasis. Single low IgG2, especially so close to normal levels, does not yet make the diagnosis of an IgG2 subclass deficiency, but is suggestive of it. Will need repeated.   - Tetanus toxoid IgG, MBL, pneumococcal serologies, A1AT phenotype, and  repeat IgG subclasses ordered   - Allergen specific IgE to common inhaled allergens ordered  - Will likely do pneumovax following lab results    Vocal Cord Dysfunction  - Symptoms, especially cough, are likely multifactorial and agree with management per Pulmonology. However, I suspect VCD is present given symptoms and known GERD/post nasal drip.   - Continue Atrovent  nasal spray and PPI  - Vocal cord exercises given    - Patient/Family educated on differential diagnosis including recommended workup  - Patient/Family educated on treatment options, including risks and benefits  - Management complicated by bronchiectasis, pulmonary nodules, CAD (MI in Summer 2024), neuropathy, migraines, multinodular goiter s/p thyroidectomy, HLD, GERD, and hiatal hernia  - Patient/Family understands and in agreement with plan    Follow up in 2 months, sooner if needed      Jannetta Coin, DO  [facility] Medicine  Allergy/Immunology  09/26/2024 16:46           [1]   Allergies  Allergen Reactions    Cymbalta  [Duloxetine ]      Rash on feet and hands    Blue Dye      Told to avoid due to patch test result by Elita LUNA    Flavoring Agent  Other Adverse Reaction (Add comment)     Patient unsure of reaction. Found on skin test    Lipitor [Atorvastatin ] Myalgia     Muscle pain  Lisinopril  Other Adverse Reaction (Add comment)     Cough      Nickel      Blisters  Allergic to all kinds of metal     Codeine  Itching     RASH    Mobic  [Meloxicam ] Nausea/ Vomiting     Nervous and shakes     Naprosyn  [Naproxen ] Nausea/ Vomiting    Nsaids (Non-Steroidal Anti-Inflammatory Drug) Nausea/ Vomiting   [2]   Social History  Tobacco Use    Smoking status: Former     Current packs/day: 0.00     Average packs/day: 2.0 packs/day for 40.0 years (80.0 ttl pk-yrs)     Types: Cigarettes     Start date: 01/22/1965     Quit date: 01/22/2005     Years since quitting: 19.6    Smokeless tobacco: Never   Vaping Use    Vaping status: Never Used   Substance Use Topics     Alcohol use: No     Alcohol/week: 0.0 standard drinks of alcohol    Drug use: No

## 2024-09-26 NOTE — Patient Instructions (Signed)
 PAUSED BREATHING:  Sit in a position that allows your neck & shoulders to relax but keep your back straight.  Breathe in gently through the nose.  Stick your tongue out of your mouth, past the teeth & lower lip, in preparation to exhale. This forward stretch of the tongue helps to open the airway at the vocal cords. This may be difficult to do with a severe spasm but will be easier the more you repeat this exercise.  With the tongue out, exhale only through the mouth in slow, paused or spaced breaths. The timing should be like saying Renea Ee, Ha, Ha, very slowly. Don't use your voice, just breathe out.  Repeat 10 times and practice 3 times a day so you will know how to do it well when VCD occurs.  BELLY BREATHING:  Sit in a position that allows your neck and shoulders to relax but keep your back straight.  Place your hand on your belly. Breathe in gently through the nose with your belly pushing your hand outward from your body.  As you start to exhale, place the tip of your tongue where your upper teeth meet the roof of your mouth. This will allow you to make a hissing or "S" sound as you exhale. This creates a back pressure to help keep the airway open.  Slowly exhale allowing the hand & belly to move inward to a resting position and make the hissing or "S" sound as you push the air between your tongue & teeth.  Repeat 10 times & practice 3 times a day so you will know how to do it well when VCD occurs.

## 2024-09-27 ENCOUNTER — Ambulatory Visit
Admission: RE | Admit: 2024-09-27 | Discharge: 2024-09-27 | Disposition: A | Discharge status: Home / Self Care | Source: Ambulatory Visit | Attending: Student in an Organized Health Care Education/Training Program | Admitting: Student in an Organized Health Care Education/Training Program

## 2024-09-27 ENCOUNTER — Other Ambulatory Visit: Payer: Self-pay

## 2024-09-27 DIAGNOSIS — R7989 Other specified abnormal findings of blood chemistry: Secondary | ICD-10-CM | POA: Insufficient documentation

## 2024-09-29 NOTE — Progress Notes (Signed)
 [facility]   Pulmonary Clinic Note  Date: 10/01/2024  Name: [name]  MRN: [id number]     Chief complaint: Follow Up and Esophageal Reflux    Last clinic visit: 06/26/2024    History of Present Illness  [name] is a 68 year old female with severe acid reflux who presents with worsening symptoms and poor response to current treatment.    She experiences severe acid reflux that has significantly worsened, impacting her sleep and ability to eat. The sensation is described as 'like somebody's taking a lighter' to her insides. Despite being on Carafate , Protonix , and Pepcid , her symptoms persist, and she often finds herself sitting up to sleep without relief. She uses Tums and cough drops frequently to manage symptoms.    Approximately one month ago, she underwent an upper endoscopy and a pH study, which confirmed significant acid reflux. The endoscopy revealed a tortuous esophagus and a small polyp, but no bleeding or significant abnormalities in the stomach or duodenum. Biopsies showed vascular congestion and reactive changes consistent with reflux esophagitis, but no eosinophilic esophagitis, dysplasia, or cancer.    She has a history of bronchiectasis. She uses albuterol  two to three times daily, which provides some relief, but other inhalers have been ineffective.    She mentions a history of a hiatal hernia, which may be contributing to her reflux symptoms. She has also experienced episodes of high blood pressure, with a recent reading of 109/101 mmHg. She recalls a recent emergency room visit for chest pain, where a CT scan was performed, but she was not informed of the results.    No coughing up of sputum is reported. She experiences difficulty with vocal cord breathing exercises due to severe coughing.       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, slightly increased from 2021  Smoking:  2 ppd for 40 years (80 pack year) smoking history, quit in 2006  Occupation:  [employer]itting [employer], [employer], [employer]  Exposures:  chemical dye inhalation, allergic to blue dye and metals  Rheumatologic:  none  Travel:  none  Pets:  none, daughter had birds  Family history:  granddaughter with asthma  mMRC dyspnea score today:  2     Pulmonary medications:  Albuterol  PRN  Atrovent  and Flonase  nasal sprays    Pulmonary vaccinations:   Pulmonary vaccines due   Topic Date Due    Covid-19 Vaccine (Shared decision making) (4 - 2025-26 season) 06/25/2024          COVID vaccine Patient plans to receive elsewhere         Physical Exam  VITALS: BP (!) 146/89   Pulse 95   Temp 36 C (96.8 F)   Ht 1.651 m (5' 5)   Wt 104 kg (228 lb 6.3 oz)   SpO2 96%   BMI 38.01 kg/m      GENERAL: Alert, cooperative, well developed, no acute distress  HEENT: Normocephalic, normal oropharynx, moist mucous membranes  CHEST: Clear to auscultation bilaterally, no wheezes, rhonchi, or crackles  CARDIOVASCULAR: Normal heart rate and rhythm  ABDOMEN: Soft, non-distended  EXTREMITIES: No edema  NEUROLOGICAL: Alert, oriented    Results  LABS  Hgb 12  Eos undetected  RAST negative  IgE undetected  RADIOLOGY  Chest CTA PE: no pulmonary embolism, subpleural GGO in LLL, 5 mm prev 8 mm, small hiatal hernia, esophageal thickening (09/24/2024)    DIAGNOSTIC  Upper endoscopy: Tortuous esophagus, single 2 mm polyp, no bleeding, normal stomach and duodenum (08/28/2024)  pH monitoring: Positive with acid reflux score, 7% acid reflux, no non-acid reflux (08/28/2024)    PATHOLOGY  Esophageal biopsy: Vascular congestion, reactive changes, no inflammatory infiltrates, negative for dysplasia or malignancy, 2 eosinophils per high power field (08/28/2024)      Assessment & Plan  Gastroesophageal reflux disease  with severe symptoms and hiatal hernia  Severe gastroesophageal reflux disease with significant impact on quality of life, including sleep disturbances and chronic cough. Current medications (Carafate , Protonix , Pepcid ) are ineffective. Recent endoscopy and pH study confirm significant acid reflux. Presence of a hiatal hernia contributing to symptoms. Surgical intervention considered due to severity and impact on lung health.  - Refer to thoracic surgery for evaluation of hiatal hernia and potential surgical correction.  - Continue current medications (Carafate , Protonix , Pepcid ) until surgical consultation.  - Recommend follow up with GI for severe GERD    Bronchiectasis with chronic cough  Chronic cough likely secondary to gastroesophageal reflux disease and aspiration of gastric contents. Bronchiectasis with scarring in the lungs, likely exacerbated by reflux. Current management includes albuterol  inhaler, which provides symptomatic relief.  - Continue albuterol  inhaler as needed for symptomatic relief.  - Referred to thoracic surgery for evaluation of potential surgical intervention for reflux-related lung damage.    Abnormal lung imaging findings  Abnormal lung imaging findings consistent with bronchiectasis and scarring, likely due to chronic aspiration from gastroesophageal reflux disease. Recent CT scan performed in the ER due to chest pain, but new imaging is required at this time.    Recording duration: 14 minutes    Return to clinic: Return in about 6 months (around 04/01/2025), or if symptoms worsen or fail to improve, for In Person Visit.   Patient seen and discussed with attending physician: Dr. Rockey Miu    Orders Placed This Encounter    Referral to Thoracic Surgery       This note was created with assistance from [employer] via capture of conversational audio. Consent was obtained from the patient and all parties present prior to recording.      Norleen Ruth, MD  Fellow, Pulmonary & Critical Care  Medicine  Department of Medicine  Pager [phone]    Pulmonary medicine was consulted.  I saw and examined the patient.  I reviewed the fellow's note.  I agree with the findings and plan of care as documented in the above note.  Any exceptions/additions are edited/noted.    Rockey CORDOBA Miu, MD  13:46 10/02/2024   Associate Professor  Pulmonary & Critical Care Medicine  [facility]

## 2024-09-30 ENCOUNTER — Other Ambulatory Visit: Payer: Self-pay

## 2024-10-01 ENCOUNTER — Encounter (INDEPENDENT_AMBULATORY_CARE_PROVIDER_SITE_OTHER): Payer: Self-pay | Admitting: Internal Medicine

## 2024-10-01 ENCOUNTER — Ambulatory Visit: Payer: Self-pay | Attending: Pulmonary Disease | Admitting: Internal Medicine

## 2024-10-01 VITALS — BP 146/89 | HR 95 | Temp 96.8°F | Ht 65.0 in | Wt 228.4 lb

## 2024-10-01 DIAGNOSIS — J479 Bronchiectasis, uncomplicated: Secondary | ICD-10-CM

## 2024-10-01 DIAGNOSIS — R918 Other nonspecific abnormal finding of lung field: Secondary | ICD-10-CM

## 2024-10-01 DIAGNOSIS — K449 Diaphragmatic hernia without obstruction or gangrene: Secondary | ICD-10-CM | POA: Insufficient documentation

## 2024-10-01 DIAGNOSIS — K219 Gastro-esophageal reflux disease without esophagitis: Secondary | ICD-10-CM

## 2024-10-01 DIAGNOSIS — R059 Cough, unspecified: Secondary | ICD-10-CM

## 2024-10-01 DIAGNOSIS — R053 Chronic cough: Secondary | ICD-10-CM

## 2024-10-01 LAB — ALPHA-1-ANTITRYPSIN PHENOTYPE, SERUM

## 2024-10-02 ENCOUNTER — Other Ambulatory Visit: Payer: Self-pay

## 2024-10-02 ENCOUNTER — Encounter (HOSPITAL_BASED_OUTPATIENT_CLINIC_OR_DEPARTMENT_OTHER): Payer: Self-pay | Admitting: Medical

## 2024-10-02 ENCOUNTER — Ambulatory Visit: Attending: Medical | Admitting: Medical

## 2024-10-02 ENCOUNTER — Ambulatory Visit (HOSPITAL_BASED_OUTPATIENT_CLINIC_OR_DEPARTMENT_OTHER): Payer: Self-pay | Admitting: Medical

## 2024-10-02 VITALS — BP 122/68 | HR 88 | Temp 97.0°F | Ht 65.63 in | Wt 230.2 lb

## 2024-10-02 DIAGNOSIS — R3915 Urgency of urination: Secondary | ICD-10-CM | POA: Insufficient documentation

## 2024-10-02 DIAGNOSIS — R300 Dysuria: Secondary | ICD-10-CM | POA: Insufficient documentation

## 2024-10-02 DIAGNOSIS — R350 Frequency of micturition: Secondary | ICD-10-CM | POA: Insufficient documentation

## 2024-10-02 DIAGNOSIS — R1024 Suprapubic pain: Secondary | ICD-10-CM | POA: Insufficient documentation

## 2024-10-02 LAB — URINALYSIS, MACROSCOPIC
BLOOD: NEGATIVE mg/dL
COLOR: NORMAL
GLUCOSE: NEGATIVE mg/dL
KETONES: NEGATIVE mg/dL
NITRITE: POSITIVE — AB
PH: 7 (ref 5.0–8.0)
PROTEIN: NEGATIVE mg/dL
SPECIFIC GRAVITY: 1.023 (ref 1.005–1.030)
UROBILINOGEN: 3 mg/dL — AB

## 2024-10-02 LAB — URINALYSIS, MICROSCOPIC
RBCS: 3 /HPF (ref <6.0)
WBCS: 78 /HPF — ABNORMAL HIGH (ref <11.0)

## 2024-10-02 MED ORDER — SULFAMETHOXAZOLE 800 MG-TRIMETHOPRIM 160 MG TABLET: 1.0000 | ORAL_TABLET | Freq: Two times a day (BID) | ORAL | 0 refills | Status: AC

## 2024-10-02 NOTE — Nursing Note (Signed)
 10/02/24 1416   PHQ 9 (follow up)   Little interest or pleasure in doing things. 1   Feeling down, depressed, or hopeless 1   Trouble falling or staying asleep, or sleeping too much. 2  (staying)   Feeling tired or having little energy 3   Poor appetite or overeating 3  (poor)   Feeling bad about yourself/ that you are a failure in the past 2 weeks? 0   Trouble concentrating on things in the past 2 weeks? 1   Moving/Speaking slowly or being fidgety or restless  in the past 2 weeks? 1   Thoughts that you would be better off DEAD, or of hurting yourself in some way. 0   If you checked off any problems, how difficult have these problems made it for you to do your work, take care of things at home, or get along with other people? Very difficult   PHQ 9 Total 12

## 2024-10-02 NOTE — Progress Notes (Cosign Needed)
 [facility]   Department of Family Medicine   Acute Visit Note    [name]  MRN: [id number]  DOB: 13-Jul-1956  Date of Service: 10/02/2024  PCP: Rayfield Barrack, PA-C    CHIEF COMPLAINT  Chief Complaint   Patient presents with    Urinary Urgency    Urinary Pain       Subjective    SUBJECTIVE  [name] is a 68 y.o. female who presents to clinic for APV.     Patient complains of intense dysuria with burning, urinary frequency, urinary urgency, and suprapubic pain starting last night at 2:00 AM. She states the burning and urgency is constant even after voiding. She also endorses noticing a small amount of blood on the toilet paper after she wipes. She denies any new fevers, chills, or back pain since onset. She took Azo which provided some relief, but symptoms are still severe at this time. She also mentions having a kidney and bladder ultrasound last week, the results of which were normal.       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    Chest pain 02/19/2016    Chest pain, rule out acute myocardial infarction 09/18/2024    Depression     Dyspnea on exertion     GERD (gastroesophageal reflux disease)     controlled w/med    H/O hearing loss     b/l, no aids    Headache     Hearing loss     Heart murmur 1979    benign    History of coronary angioplasty with insertion of stent 05/09/2023    Hyperlipidemia LDL goal < 100 08/29/2013    denies    Migraine     MINOR CAD (coronary artery disease) 11/10/2012    no treatment other than cholesterol medications; follows regularly with K.Dalton, PA    Multinodular goiter     Muscle weakness     neck     Myocardial infarction 05/09/2023    04/18/23, sees [facility] cards    Neck problem     Obesity     Other chronic pain 09/19/2024    Peripheral edema     Rash     eczema    Rheumatic fever     in 1974 no  complications    S/P thyroidectomy     Shortness of breath     DOE    Squamous cell carcinoma 02/06/2015    right side of neck    Thyroid  disorder     s/p part thyroidectomy    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    Medications:   acetaminophen  (TYLENOL ) 500 mg Oral Tablet, Take 2 Tablets (1,000 mg total) by mouth Three times a day as needed for Pain  albuterol  sulfate (PROVENTIL  OR VENTOLIN  OR PROAIR ) 90 mcg/actuation Inhalation oral inhaler, Take 1-2 Puffs by inhalation Every 6 hours as needed for Other (wheezing, shortness of breath)  aspirin  81 mg  Oral Tablet, Chewable, Chew 1 Tablet (81 mg total) Every morning  baclofen  (LIORESAL ) 5 mg Oral Tablet, Take 1 Tablet (5 mg total) by mouth Three times a day (Patient taking differently: Take 1 Tablet (5 mg total) by mouth Three times a day as needed)  cetirizine  (ZYRTEC ) 10 mg Oral Tablet, Take 1 Tablet (10 mg total) by mouth Once per day as needed for Allergies  clobetasoL  (TEMOVATE ) 0.05 % Ointment, Apply topically Twice daily  desvenlafaxine  (PRISTIQ ) 100 mg Oral Tablet Sustained Release 24 hr, Take 1 Tablet (100 mg total) by mouth Daily  diclofenac  sodium (VOLTAREN ) 1 % Gel, Apply topically Four times a day - before meals and bedtime (Patient taking differently: Apply topically Four times a day as needed)  famotidine  (PEPCID ) 40 mg Oral Tablet, Take 1 Tablet (40 mg total) by mouth Twice daily  gabapentin  (NEURONTIN ) 800 mg Oral Tablet, Take 1 Tablet (800 mg total) by mouth Twice daily Take 1 Tablet (800 mg total) by mouth Twice daily  ipratropium bromide  (ATROVENT ) 42 mcg (0.06 %) Nasal Spray, Non-Aerosol, Administer 2 Sprays into affected nostril(s) Three times a day  isosorbide  mononitrate (IMDUR ) 60 mg Oral Tablet Sustained Release 24 hr, Take 1 Tablet  (60 mg total) by mouth Every morning  metoprolol  succinate (TOPROL -XL) 25 mg Oral Tablet Sustained Release 24 hr, Take 1 Tablet (25 mg total) by mouth Daily  mirabegron  (MYRBETRIQ ) 25 mg Oral Tablet Sustained Release 24 hr, Take 1 Tablet (25 mg total) by mouth Daily Take 1 Tablet (25 mg total) by mouth Daily  nitroGLYCERIN  (NITROSTAT ) 0.4 mg Sublingual Tablet, Sublingual, DISSOLVE 1 TABLET UNDER THE TONGUE AS NEEDED FOR CHEST PAIN EVERY 5 MINUTES UP TO 3 TIMES. IF NO RELIEF CALL 911.  ondansetron  (ZOFRAN ) 4 mg Oral Tablet, Take 1 Tablet (4 mg total) by mouth Every 8 hours as needed (or vomiting)  pantoprazole  (PROTONIX ) 40 mg Oral Tablet, Delayed Release (E.C.), Take 1 Tablet (40 mg total) by mouth Twice daily  rosuvastatin  (CRESTOR ) 40 mg Oral Tablet, Take 1 Tablet (40 mg total) by mouth Every evening  sucralfate  (CARAFATE ) 100 mg/mL Oral Suspension, Take 10 mL (1 g total) by mouth Every 6 hours as needed    No facility-administered medications prior to visit.    Allergies:  Allergies[1]    Objective    OBJECTIVE  BP 122/68   Pulse 88   Temp 36.1 C (97 F) (Thermal Scan)   Ht 1.667 m (5' 5.63) Comment: shoes  Wt 104 kg (230 lb 2.6 oz) Comment: shoes  SpO2 96%   BMI 37.57 kg/m   Constitutional/General: Moderate distress, overweight, well kempt  Lungs: Symmetric chest rise BIL, clear to auscultation bilaterally  Cardiovascular: Well perfused, RRR, no murmur appreciated   Abdomen/Gastrointestinal: suprapubic region tender to palpation, no CVA tenderness  Extremities: no cyanosis or edema  Skin: warm and dry, no rash  Neurologic: gait is normal, AOx3  Psychiatric: normal affect and behavior    Assessment & Plan    ASSESSMENT/PLAN  [name] a 68 y.o. female presenting for APV      ICD-10-CM    1. Dysuria  R30.0 URINALYSIS, MACROSCOPIC AND MICROSCOPIC W/CULTURE REFLEX      2. Urinary frequency  R35.0 URINALYSIS, MACROSCOPIC AND MICROSCOPIC W/CULTURE REFLEX      3. Urinary urgency  R39.15 URINALYSIS,  MACROSCOPIC AND MICROSCOPIC W/CULTURE REFLEX      4. Suprapubic pain, acute  R10.24 URINALYSIS, MACROSCOPIC AND MICROSCOPIC W/CULTURE REFLEX      -  Acute concern  - In-office urine dip unable to be performed due to discoloration from Azo  - Formal UA with culture ordered. When results come back, will contact patient with results and prescribe appropriate antibiotics if necessary       F/u PRN if symptoms worsen or fail to improve        Lonni Beal, PA-C           [1]   Allergies  Allergen Reactions    Cymbalta  [Duloxetine ]      Rash on feet and hands    Blue Dye      Told to avoid due to patch test result by Elita LUNA    Flavoring Agent  Other Adverse Reaction (Add comment)     Patient unsure of reaction. Found on skin test    Lipitor [Atorvastatin ] Myalgia     Muscle pain    Lisinopril  Other Adverse Reaction (Add comment)     Cough      Nickel      Blisters  Allergic to all kinds of metal     Codeine  Itching     RASH    Mobic  [Meloxicam ] Nausea/ Vomiting     Nervous and shakes     Naprosyn  [Naproxen ] Nausea/ Vomiting    Nsaids (Non-Steroidal Anti-Inflammatory Drug) Nausea/ Vomiting

## 2024-10-02 NOTE — H&P (Signed)
 Thoracic Surgery, [facility]  1 [facility]  [address] [place] [address]  [phone]    Visit Date: 10/03/2024    Referring Provider: Rockey Nicholaus Miu, MD  PCP: Rayfield Barrack, PA-C    Reason For Visit: New Patient Visit     History of Present Illness:  [name] is a 68 y.o. White female who presents as a new patient for evaluation of worsening acid reflux. Patient presented to pulmonology clinic with complaints of severe acid reflux that has significantly worsened, impacting her sleep and ability to eat despite taking Carafate , Protonix , and Pepcid . Patient underwent EGD with pH study on *** revealing a tortuous esophagus, small gastric 2mm polyp, and a DeMeester score of 23.5 . 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    Chest pain 02/19/2016    Chest pain, rule out acute myocardial infarction 09/18/2024    Depression     Dyspnea on exertion     GERD (gastroesophageal reflux disease)     controlled w/med    H/O hearing loss     b/l, no aids    Headache     Hearing loss     Heart murmur 1979    benign    History of coronary angioplasty with insertion of stent 05/09/2023    Hyperlipidemia LDL goal < 100 08/29/2013    denies    Migraine     MINOR CAD (coronary artery disease) 11/10/2012    no treatment other than cholesterol medications; follows regularly with K.Dalton, PA    Multinodular goiter     Muscle weakness     neck     Myocardial infarction 05/09/2023    04/18/23, sees  cards    Neck problem     Obesity     Other chronic pain 09/19/2024    Peripheral edema     Rash     eczema    Rheumatic fever     in 1974 no complications    S/P thyroidectomy     Shortness of breath     DOE    Squamous cell  carcinoma 02/06/2015    right side of neck    Thyroid  disorder     s/p part thyroidectomy    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    Tinnitus     Vaginal prolapse 2012    surgery improved significantly    Wears glasses            Past Surgical History:   Procedure Laterality Date    CATARACT EXTRACTION Right 12/07/2022    CHALAZION EXCISION Left 12/22/2021    and conj lesion excision from caruncle    COLONOSCOPY  01/06/2009    COLONOSCOPY performed by KARIN, AHMED F at [facility] OR ENDO    GASTROSCOPY  03/05/2010    GASTROSCOPY performed by WAVERLY, SWATI at [facility] OR ENDO    GASTROSCOPY WITH BIOPSY  03/05/2010    GASTROSCOPY WITH BIOPSY performed by WAVERLY PLEAS at [facility] OR ENDO    HX ADENOIDECTOMY      HX ANKLE FRACTURE TX  2007    left distal fibula, casted  HX APPENDECTOMY      HX CERVICAL SPINE SURGERY      HX CHOLECYSTECTOMY      HX COLONOSCOPY      HX CYSTOCELE REPAIR  09/24/2009    HX HAND SURGERY  2012    for Carpal tunnel : right side treated    HX HEART CATHETERIZATION      HX HYSTERECTOMY      HX OOPHORECTOMY      left ovary removed    HX PAROTIDECTOMY       surgical clamp     HX PARTIAL THYROIDECTOMY Right 04/05/2019    R hemithyroidectomy for nodule, final path follicular adenoma    HX TONSILLECTOMY      HX TOTAL VAGINAL HYSTERECTOMY  1979    HX WISDOM TEETH EXTRACTION      HX WRIST FRACTURE TX Left 2007    FOOSH injury    PARATHYROID  GLAND SURGERY      PARS PLANA VITRECTOMY Right 11/02/2021    Pars plana vitrectomy, nternal limiting membrane (ILM) and epiretinal membrane (ERM) peel, Injection of 20%  sf6 gas, Subtenon's injection of Cefuroxime  50 mg and Dexamethasone  2 mg    PB REVISE ULNAR NERVE AT ELBOW Left 1979    PB UPPER GI ENDOSCOPY,BIOPSY  12/19/2007    patulous GE junction zone, erythema, nonerosive GERD    SEPTOPLASTY             Social History     Socioeconomic History    Marital status: Divorced    Number of children: 3   Occupational History    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     Comment: occasionally   Tobacco Use    Smoking status: Former     Current packs/day: 0.00     Average packs/day: 2.0 packs/day for 40.0 years (80.0 ttl pk-yrs)     Types: Cigarettes     Start date: 01/22/1965     Quit date: 01/22/2005     Years since quitting: 19.7    Smokeless tobacco: Never   Vaping Use    Vaping status: Never Used   Substance and Sexual Activity    Alcohol use: No     Alcohol/week: 0.0 standard drinks of alcohol    Drug use: No    Sexual activity: Yes     Partners: Male   Other Topics Concern    Uses Cane No    Uses walker No    Uses wheelchair No    Right hand dominant Yes    Left hand dominant No    Ambidextrous No    Ability to Walk 1 Flight of Steps without SOB/CP Yes    Routine Exercise No    Ability to Walk 2 Flight of Steps without SOB/CP Yes     Comment: SOB    Total Care No    Ability To Do Own ADL's Yes    Uses Walker No    Other Activity Level Yes     Comment: housework     Uses Cane No    Shift Work No    Unusual Sleep-Wake Schedule No   Social History Narrative    Right handed.      Social Determinants of Health     Social Connections: Medium Risk (09/18/2024)    Social Connections     SDOH Social Isolation: 3 to 5 times a week     Family Medical History:  Problem Relation (Age of Onset)    Bipolar Disorder Daughter, Paternal Uncle    Breast Cancer Paternal Aunt, Paternal Aunt, Paternal Aunt, Paternal Aunt, Paternal Aunt, Other    Cancer Paternal Aunt, Paternal Aunt, Paternal Aunt, Paternal Aunt, Paternal Aunt, Other (15)    Congestive Heart Failure Father (50)    Coronary Artery Disease Mother (35), Father    Diabetes Mother    Heart Attack Father, Sister, Sister    High Cholesterol Mother    Hypertension (High Blood Pressure) Mother, Sister, Sister, Brother    Kidney Disease Sister    Leukemia Paternal Uncle    Stroke Paternal Grandmother    Thyroid  Disease Sister              Current Outpatient Medications:   Current Outpatient Medications    Medication Sig Dispense Refill    acetaminophen  (TYLENOL ) 500 mg Oral Tablet Take 2 Tablets (1,000 mg total) by mouth Three times a day as needed for Pain 90 Tablet 1    albuterol  sulfate (PROVENTIL  OR VENTOLIN  OR PROAIR ) 90 mcg/actuation Inhalation oral inhaler Take 1-2 Puffs by inhalation Every 6 hours as needed for Other (wheezing, shortness of breath) 18 g 11    aspirin  81 mg Oral Tablet, Chewable Chew 1 Tablet (81 mg total) Every morning      baclofen  (LIORESAL ) 5 mg Oral Tablet Take 1 Tablet (5 mg total) by mouth Three times a day (Patient taking differently: Take 1 Tablet (5 mg total) by mouth Three times a day as needed) 270 Tablet 3    cetirizine  (ZYRTEC ) 10 mg Oral Tablet Take 1 Tablet (10 mg total) by mouth Once per day as needed for Allergies      clobetasoL  (TEMOVATE ) 0.05 % Ointment Apply topically Twice daily 60 g 3    desvenlafaxine  (PRISTIQ ) 100 mg Oral Tablet Sustained Release 24 hr Take 1 Tablet (100 mg total) by mouth Daily 90 Tablet 1    diclofenac  sodium (VOLTAREN ) 1 % Gel Apply topically Four times a day - before meals and bedtime (Patient taking differently: Apply topically Four times a day as needed) 100 g 2    famotidine  (PEPCID ) 40 mg Oral Tablet Take 1 Tablet (40 mg total) by mouth Twice daily 180 Tablet 3    gabapentin  (NEURONTIN ) 800 mg Oral Tablet Take 1 Tablet (800 mg total) by mouth Twice daily Take 1 Tablet (800 mg total) by mouth Twice daily 180 Tablet 1    ipratropium bromide  (ATROVENT ) 42 mcg (0.06 %) Nasal Spray, Non-Aerosol Administer 2 Sprays into affected nostril(s) Three times a day 15 mL 5    isosorbide  mononitrate (IMDUR ) 60 mg Oral Tablet Sustained Release 24 hr Take 1 Tablet (60 mg total) by mouth Every morning 90 Tablet 3    metoprolol  succinate (TOPROL -XL) 25 mg Oral Tablet Sustained Release 24 hr Take 1 Tablet (25 mg total) by mouth Daily 100 Tablet 3    mirabegron  (MYRBETRIQ ) 25 mg Oral Tablet Sustained Release 24 hr Take 1 Tablet (25 mg total) by mouth Daily Take  1 Tablet (25 mg total) by mouth Daily 90 Tablet 1    nitroGLYCERIN  (NITROSTAT ) 0.4 mg Sublingual Tablet, Sublingual DISSOLVE 1 TABLET UNDER THE TONGUE AS NEEDED FOR CHEST PAIN EVERY 5 MINUTES UP TO 3 TIMES. IF NO RELIEF CALL 911. 25 Tablet 10    ondansetron  (ZOFRAN ) 4 mg Oral Tablet Take 1 Tablet (4 mg total) by mouth Every 8 hours as needed (or vomiting) 30 Tablet 11  pantoprazole  (PROTONIX ) 40 mg Oral Tablet, Delayed Release (E.C.) Take 1 Tablet (40 mg total) by mouth Twice daily 180 Tablet 3    rosuvastatin  (CRESTOR ) 40 mg Oral Tablet Take 1 Tablet (40 mg total) by mouth Every evening 100 Tablet 3    sucralfate  (CARAFATE ) 100 mg/mL Oral Suspension Take 10 mL (1 g total) by mouth Every 6 hours as needed 473 mL 3     No current facility-administered medications for this visit.       Allergies:  Allergies[1]    Review of Systems:   {Ros - complete:30496}    There were no vitals taken for this visit.        Wt Readings from Last 3 Encounters:   10/02/24 104 kg (230 lb 2.6 oz)   10/01/24 104 kg (228 lb 6.3 oz)   09/26/24 104 kg (229 lb 8 oz)     There is no height or weight on file to calculate BMI.      Physical Exam:  There were no vitals taken for this visit.      Wt Readings from Last 3 Encounters:   10/02/24 104 kg (230 lb 2.6 oz)   10/01/24 104 kg (228 lb 6.3 oz)   09/26/24 104 kg (229 lb 8 oz)       General: no acute distress, appears stated age  Head: normocephalic, atraumatic  Eyes anicteric, extraocular movements are intact  Neck: trachea midline, no adenopathy, no masses, no JVD  Chest: clear bilaterally, distant breath sounds, normal excursion   Heart: RRR, no rubs or gallops, first and second heart sounds audible  Abdomen: soft, non-tender, non-distended, no hepatosplenomegaly, masses, or hernia   Lymphatics: no palpable adenopathy  Extremeties: no edema, cyanosis, or ulcerations  Skin: normal turgor, non-icteric, no obvious rashes  Neuro: no focal neurological deficit, no tremor  Psych: normal affect  and judgement  {Comprehensive Exam:21030000}      Labs:  I have reviewed all lab values and culture results. Pertinent results are as below:  No components found for: CBC  No components found for: CMP  INR   Date Value Ref Range Status   02/19/2016 0.94 0.80 - 1.20 Final   11/10/2012 1.0 0.8 - 1.2 Final     Comment:     For coumadin therapy: Conventional anticoagulation: 2.0 to 3.0   Intensive anticoagulation: 2.5 to 3.5     Radiology (personally reviewed and discussed with the patient):    {Significant Radiology:210340095}         STS Comorbid Conditions and Risk Factors - Hiatal/Paraesophageal Hernia:  PPI Relief:  {PPI relief:27694}  SmokingHx:   {Current/Past/Never:52444}   PackYears:   {Numbers; 0-100:15068}             Quit:    {TOB QUIT BZJM:47554}    Functional Status: {Dependency:210240108}  ECOG Score: {findings; ecog performance status:31780}    Patient has decision making capacity:  {YES NO COMMENTS:21021329}  Advance Directive:  {ADVANCE DIRECTIVES:50021890}  ASSESSMENT:  ***    PLAN:  ***    The patient had the opportunity to ask questions and all questions were answered to the patients satisfaction.     The patient was seen and evaluated by Dr. [place].     I independently of the faculty provider spent a total of (***) minutes in direct/indirect care of this patient including initial evaluation, review of laboratory, radiology, diagnostic studies, review of medical record, order entry and coordination of care.    Lauraine Donning, PA-C       [  1]   Allergies  Allergen Reactions    Cymbalta  [Duloxetine ]      Rash on feet and hands    Blue Dye      Told to avoid due to patch test result by Elita LUNA    Flavoring Agent  Other Adverse Reaction (Add comment)     Patient unsure of reaction. Found on skin test    Lipitor [Atorvastatin ] Myalgia     Muscle pain    Lisinopril  Other Adverse Reaction (Add comment)     Cough      Nickel      Blisters  Allergic to all kinds of metal     Codeine  Itching     RASH     Mobic  [Meloxicam ] Nausea/ Vomiting     Nervous and shakes     Naprosyn  [Naproxen ] Nausea/ Vomiting    Nsaids (Non-Steroidal Anti-Inflammatory Drug) Nausea/ Vomiting

## 2024-10-03 ENCOUNTER — Other Ambulatory Visit: Payer: Self-pay

## 2024-10-03 ENCOUNTER — Encounter (HOSPITAL_COMMUNITY): Payer: Self-pay | Admitting: THORACIC SURGERY CARDIOTHORACIC VASCULAR SURGERY

## 2024-10-03 ENCOUNTER — Ambulatory Visit: Attending: Pulmonary Disease | Admitting: THORACIC SURGERY CARDIOTHORACIC VASCULAR SURGERY

## 2024-10-03 VITALS — BP 159/144 | HR 78 | Temp 97.0°F | Ht 65.0 in | Wt 228.8 lb

## 2024-10-03 DIAGNOSIS — K449 Diaphragmatic hernia without obstruction or gangrene: Secondary | ICD-10-CM

## 2024-10-03 DIAGNOSIS — Z01818 Encounter for other preprocedural examination: Secondary | ICD-10-CM | POA: Insufficient documentation

## 2024-10-03 DIAGNOSIS — K219 Gastro-esophageal reflux disease without esophagitis: Secondary | ICD-10-CM | POA: Insufficient documentation

## 2024-10-03 DIAGNOSIS — R059 Cough, unspecified: Secondary | ICD-10-CM | POA: Insufficient documentation

## 2024-10-03 DIAGNOSIS — R634 Abnormal weight loss: Secondary | ICD-10-CM | POA: Insufficient documentation

## 2024-10-03 DIAGNOSIS — Z87891 Personal history of nicotine dependence: Secondary | ICD-10-CM

## 2024-10-03 DIAGNOSIS — J479 Bronchiectasis, uncomplicated: Secondary | ICD-10-CM | POA: Insufficient documentation

## 2024-10-03 LAB — STREPTOCOCCUS PNEUMONIAE IGG ANTIBODIES, 23 SEROTYPES, SERUM
SEROTYPE 1 (1) STREPTOCOCCUS PNEUMONIAE IGG ANTIBODIES: 0.7
SEROTYPE 12, STREPTOCOCCUS PNEUMONIAE IGG ANTIBODIES: 3
SEROTYPE 14 (14), STREPTOCOCCUS PNEUMONIAE IGG ANTIBODIES: 1
SEROTYPE 17 (17F), STREPTOCOCCUS PNEUMONIAE IGG ANTIBODIES: 1.7
SEROTYPE 19 (19F), STREPTOCOCCUS PNEUMONIAE IGG ANTIBODIES: 2.7
SEROTYPE 2 (2), STREPTOCOCCUS PNEUMONIAE IGG ANTIBODIES: <0.3
SEROTYPE 20 (20), STREPTOCOCCUS PNEUMONIAE IGG ANTIBODIES: 2.9
SEROTYPE 22 (22F), STREPTOCOCCUS PNEUMONIAE IGG ANTIBODIES: 0.3
SEROTYPE 23 (23F), STREPTOCOCCUS PNEUMONIAE IGG ANTIBODIES: 2.5
SEROTYPE 26 (6B), STREPTOCOCCUS PNEUMONIAE IGG ANTIBODIES: 1.8
SEROTYPE 3 (3), STREPTOCOCCUS PNEUMONIAE IGG ANTIBODIES: 1
SEROTYPE 34 (10A), STREPTOCOCCUS PNEUMONIAE IGG ANTIBODIES: 3.1
SEROTYPE 4 (4), STREPTOCOCCUS PNEUMONIAE IGG ANTIBODIES: 0.3
SEROTYPE 43 (11A), STREPTOCOCCUS PNEUMONIAE IGG ANTIBODIES: 0.5
SEROTYPE 5 (5), STREPTOCOCCUS PNEUMONIAE IGG ANTIBODIES: 8.8
SEROTYPE 51 (7F), STREPTOCOCCUS PNEUMONIAE IGG ANTIBODIES: 4.9
SEROTYPE 54 (15B), STREPTOCOCCUS PNEUMONIAE IGG ANTIBODIES: 1.7
SEROTYPE 56 (18C), STREPTOCOCCUS PNEUMONIAE IGG ANTIBODIES: <0.3
SEROTYPE 57 (19A), STREPTOCOCCUS PNEUMONIAE IGG ANTIBODIES: 0.8
SEROTYPE 68 (9V), STREPTOCOCCUS PNEUMONIAE IGG ANTIBODIES: 5.2
SEROTYPE 70 (33F), STREPTOCOCCUS PNEUMONIAE IGG ANTIBODIES: 5.2
SEROTYPE 8 STREPTOCOCCUS PNEUMONIAE IGG ANTIBODIES: <0.3
SEROTYPE 9, STREPTOCOCCUS PNEUMONIAE IGG ANTIBODIES: 1

## 2024-10-03 NOTE — Patient Instructions (Signed)
 Thoracic Surgery  [address]  [place], [place] [address]  Phone / [phone] [facility] Call Center)  Fax / [phone]    Surgery Date:  11/10/23, this date is subject to change.  You will be notified of any changes or any further pre-operative requirements.     Arrival Time for Surgery:  Someone will contact you by telephone one business day prior to surgery between 2:00 pm and 6:00 pm to discuss your arrival time for surgery.   (If you do not receive a phone call by 6:00 pm, please call [phone], option 4.)      Pre-Surgery Appointments:    Pre-Admission Phone call:  You will receive a phone call from a nurse in the pre-admission testing department.  They will be asking you questions over the phone for a pre-assessment for your surgery.     Pre Admission Testing Appointment: (You can eat and drink the day of this PAT appointment) You will receive a phone call with the date and time of this appointment from the pre-admission testing department.  Blood work, chest xray, and EKG is to be completed this day if needed prior to surgery. If unable to be completed prior to day of surgery, testing can be completed AM of procedure.    COVID Requirements:    COVID Testing:  Please call with any flu or COVID like symptoms, your surgery may be delayed 10-14 days after onsite of symptoms.     [facility] hospitals will continue to have significant latitude in determining if COVID screenings are necessary based on local conditions and considerations-if cases increases in the area.    Surgery Instructions:    Hold NSAIDs (Motrin , Advil , Aleve , Ibuprofen , Naprosyn , Diclofenac , Toradol ), vitamins (A, B12, D, C, E, etc.), fish oil, and herbal supplements (tumeric, zinc, etc.) 7 days prior to the procedure.    You can continue your 81 mg aspirin .     All other Medication instructions will be given at the Pre-admission testing appointment by Anesthesia.     Nothing to eat or drink after midnight the day of  surgery.     Visitor Information:    One approved adult visitor are allowed to go with the patient to registration and waiting area prior to procedure (if space allows for physical distancing).    Designated visiting hours for adult inpatient are 8am - 7 pm.      Two patient-approved adult primary caretakers/visitors at a time during designated visitation times.  Patients may indicate as many approved adult visitors as they desire.  Visitors/caretakers are allowed to the exit building and return in same day.  Two approved visitors for 60 minutes at time of admission and discharge if not within designated visiting hours.  One overnight visitor is allowed, pending patient condition.  Exceptions to above with special circumstances will be considered and approved by unit management.  When you receive your phone call with the arrival time, you can ask for the current visitor policy to have the most up to date information in case of changes.  Transportation After Surgery:    You must have responsible adult (18 year or older) driver to take you home and a responsible adult to stay with you for 24 hours after surgery.  If you do not have both, it is possible your procedure will be cancelled.    Contact Information for Questions and Paperwork:     If you have any questions please call the Thoracic Surgery Nurses (  Ki Luckman/Olivia) at [phone]    If you have any paperwork that needs to be completed for medical leave, please fax your paperwork to [phone], ATTN: Thoracic Surgery.     Consent completed in clinic.

## 2024-10-04 ENCOUNTER — Encounter (INDEPENDENT_AMBULATORY_CARE_PROVIDER_SITE_OTHER): Payer: Self-pay

## 2024-10-04 ENCOUNTER — Encounter (HOSPITAL_COMMUNITY): Payer: Self-pay | Admitting: Student in an Organized Health Care Education/Training Program

## 2024-10-04 LAB — MANNOSE BINDING LECTIN: MANNOSE BINDING LECTIN: 766 ng/mL (ref >=76)

## 2024-10-04 LAB — URINE CULTURE: URINE CULTURE: >100000 — AB

## 2024-10-04 MED ORDER — AMOXICILLIN 500 MG-POTASSIUM CLAVULANATE 125 MG TABLET: 1.0000 | ORAL_TABLET | Freq: Two times a day (BID) | ORAL | 0 refills | Status: AC

## 2024-10-05 ENCOUNTER — Ambulatory Visit (HOSPITAL_BASED_OUTPATIENT_CLINIC_OR_DEPARTMENT_OTHER): Payer: Self-pay | Admitting: PEDIATRIC MEDICINE

## 2024-10-10 ENCOUNTER — Other Ambulatory Visit: Payer: Self-pay

## 2024-10-10 ENCOUNTER — Ambulatory Visit (HOSPITAL_BASED_OUTPATIENT_CLINIC_OR_DEPARTMENT_OTHER): Payer: Self-pay

## 2024-10-11 ENCOUNTER — Other Ambulatory Visit: Payer: Self-pay

## 2024-10-12 ENCOUNTER — Ambulatory Visit: Attending: PEDIATRIC MEDICINE

## 2024-10-12 DIAGNOSIS — D803 Selective deficiency of immunoglobulin G [IgG] subclasses: Secondary | ICD-10-CM | POA: Insufficient documentation

## 2024-10-12 DIAGNOSIS — Z23 Encounter for immunization: Secondary | ICD-10-CM | POA: Insufficient documentation

## 2024-10-12 DIAGNOSIS — D849 Immunodeficiency, unspecified: Secondary | ICD-10-CM | POA: Insufficient documentation

## 2024-10-17 ENCOUNTER — Telehealth (INDEPENDENT_AMBULATORY_CARE_PROVIDER_SITE_OTHER): Payer: Self-pay | Admitting: Student in an Organized Health Care Education/Training Program

## 2024-10-17 NOTE — Telephone Encounter (Signed)
 Operated by [facility]     Patient Name: [name]  MRN: [id number]  DOB: 1956/05/03  Date of Service: 10/10/24     Devere Pouch attended the Bariatric Surgery Information Session.      Patient is aware they will need to contact their insurance company to confirm bariatric surgery coverage.     Powell Pack, Surgical Scheduler/Admin Assistant, 10/17/24

## 2024-10-28 ENCOUNTER — Other Ambulatory Visit: Payer: Self-pay

## 2024-10-29 ENCOUNTER — Encounter (HOSPITAL_BASED_OUTPATIENT_CLINIC_OR_DEPARTMENT_OTHER): Payer: Self-pay | Admitting: Physician Assistant

## 2024-10-29 ENCOUNTER — Ambulatory Visit: Payer: Self-pay | Attending: Physician Assistant | Admitting: Physician Assistant

## 2024-10-29 ENCOUNTER — Other Ambulatory Visit: Payer: Self-pay

## 2024-10-29 VITALS — BP 104/54 | HR 95 | Temp 97.9°F | Ht 65.79 in | Wt 233.7 lb

## 2024-10-29 DIAGNOSIS — F332 Major depressive disorder, recurrent severe without psychotic features: Secondary | ICD-10-CM | POA: Insufficient documentation

## 2024-10-29 DIAGNOSIS — Z6835 Body mass index (BMI) 35.0-35.9, adult: Secondary | ICD-10-CM | POA: Insufficient documentation

## 2024-10-29 DIAGNOSIS — I25119 Atherosclerotic heart disease of native coronary artery with unspecified angina pectoris: Secondary | ICD-10-CM | POA: Insufficient documentation

## 2024-10-29 DIAGNOSIS — Z1231 Encounter for screening mammogram for malignant neoplasm of breast: Secondary | ICD-10-CM | POA: Insufficient documentation

## 2024-10-29 DIAGNOSIS — Z13820 Encounter for screening for osteoporosis: Secondary | ICD-10-CM | POA: Insufficient documentation

## 2024-10-29 DIAGNOSIS — M23301 Other meniscus derangements, unspecified lateral meniscus, left knee: Secondary | ICD-10-CM | POA: Insufficient documentation

## 2024-10-29 DIAGNOSIS — M1990 Unspecified osteoarthritis, unspecified site: Secondary | ICD-10-CM | POA: Insufficient documentation

## 2024-10-29 DIAGNOSIS — N183 Chronic kidney disease, stage 3 unspecified: Secondary | ICD-10-CM | POA: Insufficient documentation

## 2024-10-29 DIAGNOSIS — Z0000 Encounter for general adult medical examination without abnormal findings: Secondary | ICD-10-CM | POA: Insufficient documentation

## 2024-10-29 DIAGNOSIS — J479 Bronchiectasis, uncomplicated: Secondary | ICD-10-CM | POA: Insufficient documentation

## 2024-10-29 DIAGNOSIS — D803 Selective deficiency of immunoglobulin G [IgG] subclasses: Secondary | ICD-10-CM | POA: Insufficient documentation

## 2024-10-29 NOTE — Progress Notes (Addendum)
 FAMILY MEDICINE, [facility]  [address]  [place] [address]  Operated by [facility]  Medicare Annual Wellness Visit    Name: [name] MRN:  [id number]   Date: 10/29/2024 Age: 69 y.o.       SUBJECTIVE:   [name] is a 69 y.o. female for presenting for Medicare Wellness exam.   I have reviewed and reconciled the medication list with the patient today.        10/29/2024     7:04 PM 12/02/2023    10:20 AM 05/09/2023    10:34 AM   Comprehensive Health Assessment-Adult   Do you wish to complete this form? Yes Yes Yes   During the past 4 weeks, how would you rate your health in general? Fair Fair Good   During the past 4 weeks, how much difficulty have you had doing your usual activities inside and outside your home because of medical or emotional problems? Much difficulty Much difficulty       for a few days much dificulty but most of the time only some difficulty A little bit of difficulty   During the past 4 weeks, was someone available to help you if you needed and wanted help? Yes, some No Yes, as much as I wanted   In the past year, how many times have you gone to the emergency department or been admitted to a hospital for a health problem? 1 time 2-4 times 1 time   Are you generally satisfied with your sleep? No No Yes   Do you have enough money to buy things you need in everyday life, such as food, clothing, medicines, and housing? No Yes, always Yes, always   Can you get to places beyond walking distance without help?  (For example, can you drive your own car or travel alone on buses)? Yes Yes Yes   Do you fasten your seatbelt when you are in a car? Yes, usually Yes, usually Yes, usually   Do you exercise 20 minutes 3 or more days per week (such as walking, dancing, biking, mowing grass, swimming)? Yes, some of the time Yes, most of the time Yes, most of the time   How often do you eat food that is healthy (fruits, vegetables, lean meats) instead of  unhealthy (sweets, fast food, junk food, fatty foods)? A little bit of the time Almost always Almost always   Have your parents, brothers or sisters had any of the following problems before the age of 18? (check all that apply) Heart problems, or hardening of the arteries;Mental health problems such as depression, bipolar, severe anxiety, postpartum depression;Diabetes (sugar);Alcohol or drug addiction (or abuse);Cancer;High cholesterol Heart problems, or hardening of the arteries;Diabetes (sugar);High cholesterol;Cancer;Other family illness;Mental health problems such as depression, bipolar, severe anxiety, postpartum depression Cancer;Diabetes (sugar);Heart problems, or hardening of the arteries   How often do you have trouble taking medicines the eay you are told to take them? I always take them as prescribed I always take them as prescribed I always take them as prescribed   Do you need any help communicating with your doctors and nurses because of vision or hearing problems? No No No   During the past 12 months, have you experienced confusion or memory loss that is happening more often or is getting worse? No Yes       very stressed this past week No   Do you have one person you think of as your  personal doctor (primary care provider or family doctor)? Yes Yes Yes   If you are seeing a Primary Care Provider (PCP) or family doctor. please list their name Roxie Ezra [place] Ezra, PA-C Shadana Pry Dalton   Are you now also seeing any specialist physician(s) (such as eye doctor, foot doctor, skin doctor)? Yes Yes Yes   If you are seeing a specialist for anything such as foot, eye, skin, etc.  please list their name(s) see chart see list see list   How confident are you that you can control or manage most of your health problems? Somewhat confident Very confident Very confident       I have reviewed and updated as appropriate the past medical, family and social history. 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    Chest pain 02/19/2016    Chest pain, rule out acute myocardial infarction 09/18/2024    Depression     Dyspnea on exertion     GERD (gastroesophageal reflux disease)     controlled w/med    H/O hearing loss     b/l, no aids    Headache     Hearing loss     Heart murmur 1979    benign    History of coronary angioplasty with insertion of stent 05/09/2023    Hyperlipidemia LDL goal < 100 08/29/2013    denies    Migraine     MINOR CAD (coronary artery disease) 11/10/2012    no treatment other than cholesterol medications; follows regularly with K.Dalton, PA    Multinodular goiter     Muscle weakness     neck     Myocardial infarction 05/09/2023    04/18/23, sees [place] cards    Neck problem     Obesity     Other chronic pain 09/19/2024    Peripheral edema     Rash     eczema    Rheumatic fever     in 1974 no complications    S/P thyroidectomy     Shortness of breath     DOE    Squamous cell carcinoma 02/06/2015    right side of neck    Thyroid  disorder     s/p part thyroidectomy    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    Tinnitus     Vaginal prolapse 2012    surgery improved significantly    Wears glasses      Past Surgical History:   Procedure Laterality Date    Cataract extraction Right 12/07/2022    Chalazion excision Left 12/22/2021    Colonoscopy  01/06/2009    Gastroscopy  03/05/2010    Gastroscopy with biopsy  03/05/2010    Hx adenoidectomy      Hx ankle fracture tx  2007    Hx appendectomy      Hx cervical spine surgery      Hx cholecystectomy      Hx colonoscopy      Hx cystocele repair  09/24/2009    Hx hand surgery  2012    Hx heart catheterization      Hx hysterectomy      Hx oophorectomy      Hx parotidectomy  Hx partial thyroidectomy Right 04/05/2019    Hx tonsillectomy      Hx total vaginal hysterectomy  1979    Hx  wisdom teeth extraction      Hx wrist fracture tx Left 2007    Parathyroid  gland surgery      Pars plana vitrectomy Right 11/02/2021    Pb revise ulnar nerve at elbow Left 1979    Pb upper gi endoscopy,biopsy  12/19/2007    Septoplasty       Current Outpatient Medications   Medication Sig    acetaminophen  (TYLENOL ) 500 mg Oral Tablet Take 2 Tablets (1,000 mg total) by mouth Three times a day as needed for Pain    albuterol  sulfate (PROVENTIL  OR VENTOLIN  OR PROAIR ) 90 mcg/actuation Inhalation oral inhaler Take 1-2 Puffs by inhalation Every 6 hours as needed for Other (wheezing, shortness of breath)    aspirin  81 mg Oral Tablet, Chewable Chew 1 Tablet (81 mg total) Every morning    baclofen  (LIORESAL ) 5 mg Oral Tablet Take 1 Tablet (5 mg total) by mouth Three times a day    cetirizine  (ZYRTEC ) 10 mg Oral Tablet Take 1 Tablet (10 mg total) by mouth Once per day as needed for Allergies    clobetasoL  (TEMOVATE ) 0.05 % Ointment Apply topically Twice daily    desvenlafaxine  (PRISTIQ ) 100 mg Oral Tablet Sustained Release 24 hr Take 1 Tablet (100 mg total) by mouth Daily    diclofenac  sodium (VOLTAREN ) 1 % Gel Apply topically Four times a day - before meals and bedtime    famotidine  (PEPCID ) 40 mg Oral Tablet Take 1 Tablet (40 mg total) by mouth Twice daily    gabapentin  (NEURONTIN ) 800 mg Oral Tablet Take 1 Tablet (800 mg total) by mouth Twice daily Take 1 Tablet (800 mg total) by mouth Twice daily    ipratropium bromide  (ATROVENT ) 42 mcg (0.06 %) Nasal Spray, Non-Aerosol Administer 2 Sprays into affected nostril(s) Three times a day    isosorbide  mononitrate (IMDUR ) 60 mg Oral Tablet Sustained Release 24 hr Take 1 Tablet (60 mg total) by mouth Every morning    metoprolol  succinate (TOPROL -XL) 25 mg Oral Tablet Sustained Release 24 hr Take 1 Tablet (25 mg total) by mouth Daily    mirabegron  (MYRBETRIQ ) 25 mg Oral Tablet Sustained Release 24 hr Take 1 Tablet (25 mg total) by mouth Daily Take 1 Tablet (25 mg total) by mouth  Daily    nitroGLYCERIN  (NITROSTAT ) 0.4 mg Sublingual Tablet, Sublingual DISSOLVE 1 TABLET UNDER THE TONGUE AS NEEDED FOR CHEST PAIN EVERY 5 MINUTES UP TO 3 TIMES. IF NO RELIEF CALL 911.    ondansetron  (ZOFRAN ) 4 mg Oral Tablet Take 1 Tablet (4 mg total) by mouth Every 8 hours as needed (or vomiting)    pantoprazole  (PROTONIX ) 40 mg Oral Tablet, Delayed Release (E.C.) Take 1 Tablet (40 mg total) by mouth Twice daily    rosuvastatin  (CRESTOR ) 40 mg Oral Tablet Take 1 Tablet (40 mg total) by mouth Every evening    sucralfate  (CARAFATE ) 100 mg/mL Oral Suspension Take 10 mL (1 g total) by mouth Every 6 hours as needed     Family Medical History:       Problem Relation (Age of Onset)    Bipolar Disorder Daughter, Paternal Uncle    Breast Cancer Paternal Aunt, Paternal Aunt, Paternal Aunt, Paternal Aunt, Paternal Aunt, Other    Cancer Paternal Aunt, Paternal Aunt, Paternal Aunt, Paternal Aunt, Paternal Aunt, Other (56)    Congestive Heart Failure Father (  50)    Coronary Artery Disease Mother (10), Father    Diabetes Mother    Heart Attack Father, Sister, Sister    High Cholesterol Mother    Hypertension (High Blood Pressure) Mother, Sister, Sister, Brother    Kidney Disease Sister    Leukemia Paternal Uncle    Stroke Paternal Grandmother    Thyroid  Disease Sister            Social History     Socioeconomic History    Marital status: Divorced    Number of children: 3   Occupational History    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      Social Determinants of Health     Social Connections: Medium Risk (09/18/2024)    Social Connections     SDOH Social Isolation: 3 to 5 times a week   Health Literacy: Medium Risk (09/18/2024)    Health Literacy     SDOH Health Literacy: Occasionally         List of Current Health Care Providers   Care Team       PCP       Name Type Specialty Phone Number    Ezra Quaker, [place] Physician Assistant PHYSICIAN ASSISTANT [phone]              Care Team       Name Type Specialty Phone Number    Reinhold No, MD Physician FAMILY PRACTICE [phone]    Zilphia Verlon Ade, MD Physician OPHTHALMOLOGY [phone]    Merrie Bars, MD Physician UROLOGY [phone]    Estelle Savant, MD Physician PSYCHIATRY [phone]    Niki Alm HERO, MD Not available [facility] SURGERY [phone]                      Health Maintenance   Topic Date Due    Covid-19 Vaccine (Shared decision making) (4 - 2025-26 season) 06/25/2024    Osteoporosis screening  10/05/2024    Medicare Annual Wellness Visit - Calendar Year Insurers  10/25/2024    Breast Cancer Screening  01/29/2025    Colonoscopy  07/20/2027    Tetanus-Diptheria Vaccines (4 - Td or Tdap) 03/12/2030    Hepatitis C screening  Completed    Influenza Vaccine  Completed    Shingles Vaccine  Completed    RSV Adult 60+ or Pregnancy  Completed    Pneumococcal Vaccination, Age 53+  Completed     Medicare Wellness Assessment   Medicare initial or wellness physical in the last year?: No  Advance Directives   Does patient have a living will or MPOA: No           Advance directive information given to  the patient today?: Patient Declined      Activities of Daily Living   Do you need help with dressing, bathing, or walking?: No   Do you need help with shopping, housekeeping, medications, or finances?: No   Do you have rugs in hallways, broken steps, or poor lighting?: No   Do you have grab bars in your  bathroom, non-slip strips in your tub, and hand rails on your stairs?: Yes   Cognitive Function Screen (1=Yes, 0=No)   What is you age?: Correct   What is the time to the nearest hour?: Correct   What is the year?: Correct   What is the name of this clinic?: Correct   Can the patient recognize two persons (the doctor, the nurse, home help, etc.)?: Correct   What is the date of your birth? (day and month sufficient) : Correct   In what year did World War II end?: Correct   Who is the current president of the [place] ?: Correct   Count from 20 down to 1?: Correct   What address did I give you earlier?: Correct   Total Score: 10   Interpretation of Total Score: Greater than 6 Normal   Fall Risk Screen   Do you feel unsteady when standing or walking?: Yes  Do you worry about falling?: Yes  Have you fallen in the past year?: Yes  How many times have you fallen?: 2 or more times  Were you ever injured from falling?: No  Timed up and go test (in seconds): 23   Depression Screen     Little interest or pleasure in doing things.: Nearly every day  Feeling down, depressed, or hopeless: More than half the days  PHQ 2 Total: 5  Trouble falling or staying asleep, or sleeping too much.: More than half the days  Feeling tired or having little energy: Nearly every day  Poor appetite or overeating: More than half the days  Feeling bad about yourself/ that you are a failure in the past 2 weeks?: Not at all  Trouble concentrating on things in the past 2 weeks?: Not at all  Moving/Speaking slowly or being fidgety or restless  in the past 2 weeks?: More than half the days  Thoughts that you would be better off DEAD, or of hurting yourself in some way.: Not at all  PHQ 9 Total: 14  Interpretation of Total Score: 10-14 Moderate depression     Pain Score   Pain Score:   8    Substance Use-Abuse Screening     Tobacco Use     In Past 12 MONTHS, how often have you used any tobacco product (for example, cigarettes, e-cigarettes, cigars,  pipes, or smokeless tobacco)?: Never     Alcohol use     In the PAST 12 MONTHS, how often have you had 5 (men)/4 (women) or more drinks containing alcohol in one day?: Never     Prescription Drug Use     In the PAST 12 months, how often have you used any prescription medications just for the feeling, more than prescribed, or that were not prescribed for you? Prescriptions may include: opioids, benzodiazepines, medications for ADHD: Never           Illicit Drug Use   In the PAST 12 MONTHS, how often have you used any drugs, including marijuana, cocaine or crack, heroin, methamphetamine, hallucinogens, ecstasy/MDMA?: Never            Urine Incontinence Screen  Urinary Incontinence Screen  Do you ever leak urine when you don't want to?: YES        OBJECTIVE:   BP (!) 104/54 (Site: Left Arm, Patient Position: Sitting, Cuff Size: Thigh)   Pulse 95   Temp 36.6 C (97.9 F) (Thermal Scan)   Ht 1.671 m (5' 5.79)   Wt 106 kg (233 lb 11 oz)   SpO2 94%   BMI 37.96 kg/m        Other appropriate exam:    Health Maintenance Due   Topic Date Due    Covid-19 Vaccine (Shared decision making) (4 - 2025-26 season) 06/25/2024    Osteoporosis screening  10/05/2024    Medicare Annual Wellness Visit - Calendar Year Insurers  10/25/2024      ASSESSMENT & PLAN:   Assessment/Plan   1. Annual physical exam    2. CKD (chronic kidney disease) stage 3, GFR 30-59 ml/min    3. IgG2 subclass deficiency (CMS HCC)    4. Bronchiectasis without complication (CMS HCC)    5. 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 Severe obesity (BMI 35.0-35.9 with comorbidity) (CMS HCC)    7. Degenerative tear of lateral meniscus, left    8. Osteoarthritis, unspecified osteoarthritis type, unspecified site    9. Osteoporosis screening    10. Breast cancer screening by mammogram      See attached progress note for more detailed HPI and assessment and plan.      Identified Risk Factors/ Recommended Actions      Fall Risk Follow up plan of care: Discussed optimizing home safety    Urinary Incontinence Plan of Care: Lifestyle modifications    Patient declined Advanced Directives information.        Orders Placed This Encounter    MAMMO BILATERAL SCREENING-ADDL VIEWS/BREAST US  AS REQ BY RAD    BONE DENSITOMETRY COMPARISON    traMADoL  (ULTRAM ) 50 mg Oral Tablet        The patient has been educated about risk factors and recommended preventive care. Written Prevention Plan completed/ updated and given to patient (see After Visit Summary).    Return in about 6 months (around 04/28/2025).    Saraphina Lauderbaugh Dalton, PA-C

## 2024-10-29 NOTE — Nursing Note (Signed)
 10/29/24 1904   Comprehensive Health Assessment-Adult   Do you wish to complete this form? Yes   During the past 4 weeks, how would you rate your health in general? Fair   During the past 4 weeks, how much difficulty have you had doing your usual activities inside and outside your home because of medical or emotional problems? Much difficulty   During the past 4 weeks, was someone available to help you if you needed and wanted help? Yes, some   In the past year, how many times have you gone to the emergency department or been admitted to a hospital for a health problem? 1 time   Are you generally satisfied with your sleep? No   Do you have enough money to buy things you need in everyday life, such as food, clothing, medicines, and housing? No   Can you get to places beyond walking distance without help?  (For example, can you drive your own car or travel alone on buses)? Yes   Do you fasten your seatbelt when you are in a car? Yes, usually   Do you exercise 20 minutes 3 or more days per week (such as walking, dancing, biking, mowing grass, swimming)? Yes, some of the time   How often do you eat food that is healthy (fruits, vegetables, lean meats) instead of unhealthy (sweets, fast food, junk food, fatty foods)? A little bit of the time   Have your parents, brothers or sisters had any of the following problems before the age of 76? (check all that apply) Heart problems, or hardening of the arteries;Mental health problems such as depression, bipolar, severe anxiety, postpartum depression;Diabetes (sugar);Alcohol or drug addiction (or abuse);Cancer;High cholesterol   How often do you have trouble taking medicines the eay you are told to take them? I always take them as prescribed   Do you need any help communicating with your doctors and nurses because of vision or hearing problems? No   During the past 12 months, have you experienced confusion or memory loss that is happening more often or is getting worse? No    Do you have one person you think of as your personal doctor (primary care provider or family doctor)? Yes   If you are seeing a Primary Care Provider (PCP) or family doctor. please list their name [name]   Are you now also seeing any specialist physician(s) (such as eye doctor, foot doctor, skin doctor)? Yes   If you are seeing a specialist for anything such as foot, eye, skin, etc.  please list their name(s) see chart   How confident are you that you can control or manage most of your health problems? Somewhat confident

## 2024-10-29 NOTE — Nursing Note (Signed)
 10/29/2024     7:04 PM 12/02/2023    10:20 AM 05/09/2023    10:34 AM   Comprehensive Health Assessment-Adult   Do you wish to complete this form? Yes Yes Yes   During the past 4 weeks, how would you rate your health in general? Fair Fair Good   During the past 4 weeks, how much difficulty have you had doing your usual activities inside and outside your home because of medical or emotional problems? Much difficulty Much difficulty       for a few days much dificulty but most of the time only some difficulty A little bit of difficulty   During the past 4 weeks, was someone available to help you if you needed and wanted help? Yes, some No Yes, as much as I wanted   In the past year, how many times have you gone to the emergency department or been admitted to a hospital for a health problem? 1 time 2-4 times 1 time   Are you generally satisfied with your sleep? No No Yes   Do you have enough money to buy things you need in everyday life, such as food, clothing, medicines, and housing? No Yes, always Yes, always   Can you get to places beyond walking distance without help?  (For example, can you drive your own car or travel alone on buses)? Yes Yes Yes   Do you fasten your seatbelt when you are in a car? Yes, usually Yes, usually Yes, usually   Do you exercise 20 minutes 3 or more days per week (such as walking, dancing, biking, mowing grass, swimming)? Yes, some of the time Yes, most of the time Yes, most of the time   How often do you eat food that is healthy (fruits, vegetables, lean meats) instead of unhealthy (sweets, fast food, junk food, fatty foods)? A little bit of the time Almost always Almost always   Have your parents, brothers or sisters had any of the following problems before the age of 26? (check all that apply) Heart problems, or hardening of the arteries;Mental health problems such as depression, bipolar, severe anxiety, postpartum depression;Diabetes (sugar);Alcohol or drug addiction (or  abuse);Cancer;High cholesterol Heart problems, or hardening of the arteries;Diabetes (sugar);High cholesterol;Cancer;Other family illness;Mental health problems such as depression, bipolar, severe anxiety, postpartum depression Cancer;Diabetes (sugar);Heart problems, or hardening of the arteries   How often do you have trouble taking medicines the eay you are told to take them? I always take them as prescribed I always take them as prescribed I always take them as prescribed   Do you need any help communicating with your doctors and nurses because of vision or hearing problems? No No No   During the past 12 months, have you experienced confusion or memory loss that is happening more often or is getting worse? No Yes       very stressed this past week No   Do you have one person you think of as your personal doctor (primary care provider or family doctor)? Yes Yes Yes   If you are seeing a Primary Care Provider (PCP) or family doctor. please list their name [name] Ezra, PA-C Kristi Dalton   Are you now also seeing any specialist physician(s) (such as eye doctor, foot doctor, skin doctor)? Yes Yes Yes   If you are seeing a specialist for anything such as foot, eye, skin, etc.  please list their name(s) see chart see list see list   How  confident are you that you can control or manage most of your health problems? Somewhat confident Very confident Very confident

## 2024-10-29 NOTE — Nursing Note (Signed)
 10/29/24 1855   Medicare Wellness Assessment   Medicare initial or wellness physical in the last year? No   Advance Directives   Does patient have a living will or MPOA No   Advance directive information given to the patient today? Patient Declined   Activities of Daily Living   Do you need help with dressing, bathing, or walking? No   Do you need help with shopping, housekeeping, medications, or finances? No   Do you have rugs in hallways, broken steps, or poor lighting? No   Do you have grab bars in your bathroom, non-slip strips in your tub, and hand rails on your stairs? Yes   Cognitive Function Screen   What is you age? 1   What is the time to the nearest hour? 1   What is the year? 1   What is the name of this clinic? 1   Can the patient recognize two persons (the doctor, the nurse, home help, etc.)? 1   What is the date of your birth? (day and month sufficient)  1   In what year did World War II end? 1   Who is the current president of the [place] ? 1   Count from 20 down to 1? 1   What address did I give you earlier? 1   Total Score 10   Interpretation of Total Score Greater than 6 Normal   Depression Screen   Little interest or pleasure in doing things. 3   Feeling down, depressed, or hopeless 2   PHQ 2 Total 5   Trouble falling or staying asleep, or sleeping too much. 2   Feeling tired or having little energy 3   Poor appetite or overeating 2   Feeling bad about yourself/ that you are a failure in the past 2 weeks? 0   Trouble concentrating on things in the past 2 weeks? 0   Moving/Speaking slowly or being fidgety or restless  in the past 2 weeks? 2   Thoughts that you would be better off DEAD, or of hurting yourself in some way. 0   If you checked off any problems, how difficult have these problems made it for you to do your work, take care of things at home, or get along with other people? Extremely difficult   PHQ 9 Total 14   Interpretation of Total Score Moderate depression   Pain Score   Pain  Score EIGHT   Substance Use Screening   In Past 12 MONTHS, how often have you used any tobacco product (for example, cigarettes, e-cigarettes, cigars, pipes, or smokeless tobacco)? Never   In the PAST 12 MONTHS, how often have you had 5 (men)/4 (women) or more drinks containing alcohol in one day? Never   In the PAST 12 months, how often have you used any prescription medications just for the feeling, more than prescribed, or that were not prescribed for you? Prescriptions may include: opioids, benzodiazepines, medications for ADHD Never   In the PAST 12 MONTHS, how often have you used any drugs, including marijuana, cocaine or crack, heroin, methamphetamine, hallucinogens, ecstasy/MDMA? Never   Fall Risk Assessment   Do you feel unsteady when standing or walking? Yes   Do you worry about falling? Yes   Have you fallen in the past year? Yes   How many times have you fallen? 2 or more times   Were you ever injured from falling? No   Timed up and go test (in seconds) 23  Urinary Incontinence Screen   Do you ever leak urine when you don't want to? YES   OTHER   Reported to Encounter Provider Yes

## 2024-10-30 ENCOUNTER — Encounter (INDEPENDENT_AMBULATORY_CARE_PROVIDER_SITE_OTHER): Payer: Self-pay

## 2024-10-30 ENCOUNTER — Ambulatory Visit (HOSPITAL_BASED_OUTPATIENT_CLINIC_OR_DEPARTMENT_OTHER): Admitting: Rheumatology

## 2024-10-30 ENCOUNTER — Other Ambulatory Visit: Payer: Self-pay

## 2024-10-30 ENCOUNTER — Encounter (INDEPENDENT_AMBULATORY_CARE_PROVIDER_SITE_OTHER): Payer: Self-pay | Admitting: Student in an Organized Health Care Education/Training Program

## 2024-10-30 ENCOUNTER — Ambulatory Visit
Payer: Self-pay | Attending: Student in an Organized Health Care Education/Training Program | Admitting: Student in an Organized Health Care Education/Training Program

## 2024-10-30 VITALS — BP 153/89 | HR 91 | Temp 97.9°F | Ht 64.96 in | Wt 231.7 lb

## 2024-10-30 DIAGNOSIS — D649 Anemia, unspecified: Secondary | ICD-10-CM | POA: Insufficient documentation

## 2024-10-30 DIAGNOSIS — E569 Vitamin deficiency, unspecified: Secondary | ICD-10-CM | POA: Insufficient documentation

## 2024-10-30 DIAGNOSIS — M25551 Pain in right hip: Secondary | ICD-10-CM | POA: Insufficient documentation

## 2024-10-30 DIAGNOSIS — R7303 Prediabetes: Secondary | ICD-10-CM | POA: Insufficient documentation

## 2024-10-30 DIAGNOSIS — M5090 Cervical disc disorder, unspecified, unspecified cervical region: Secondary | ICD-10-CM | POA: Insufficient documentation

## 2024-10-30 DIAGNOSIS — Z713 Dietary counseling and surveillance: Secondary | ICD-10-CM | POA: Insufficient documentation

## 2024-10-30 DIAGNOSIS — D803 Selective deficiency of immunoglobulin G [IgG] subclasses: Secondary | ICD-10-CM | POA: Insufficient documentation

## 2024-10-30 DIAGNOSIS — M179 Osteoarthritis of knee, unspecified: Secondary | ICD-10-CM | POA: Insufficient documentation

## 2024-10-30 DIAGNOSIS — R918 Other nonspecific abnormal finding of lung field: Secondary | ICD-10-CM | POA: Insufficient documentation

## 2024-10-30 DIAGNOSIS — G629 Polyneuropathy, unspecified: Secondary | ICD-10-CM | POA: Insufficient documentation

## 2024-10-30 DIAGNOSIS — Z955 Presence of coronary angioplasty implant and graft: Secondary | ICD-10-CM | POA: Insufficient documentation

## 2024-10-30 DIAGNOSIS — G43909 Migraine, unspecified, not intractable, without status migrainosus: Secondary | ICD-10-CM | POA: Insufficient documentation

## 2024-10-30 DIAGNOSIS — Z9889 Other specified postprocedural states: Secondary | ICD-10-CM | POA: Insufficient documentation

## 2024-10-30 DIAGNOSIS — K219 Gastro-esophageal reflux disease without esophagitis: Secondary | ICD-10-CM | POA: Insufficient documentation

## 2024-10-30 DIAGNOSIS — K2100 Gastro-esophageal reflux disease with esophagitis, without bleeding: Secondary | ICD-10-CM

## 2024-10-30 DIAGNOSIS — E785 Hyperlipidemia, unspecified: Secondary | ICD-10-CM | POA: Insufficient documentation

## 2024-10-30 DIAGNOSIS — Z9089 Acquired absence of other organs: Secondary | ICD-10-CM | POA: Insufficient documentation

## 2024-10-30 DIAGNOSIS — G5601 Carpal tunnel syndrome, right upper limb: Secondary | ICD-10-CM | POA: Insufficient documentation

## 2024-10-30 DIAGNOSIS — F411 Generalized anxiety disorder: Secondary | ICD-10-CM | POA: Insufficient documentation

## 2024-10-30 DIAGNOSIS — I10 Essential (primary) hypertension: Secondary | ICD-10-CM

## 2024-10-30 DIAGNOSIS — Z6838 Body mass index (BMI) 38.0-38.9, adult: Secondary | ICD-10-CM | POA: Insufficient documentation

## 2024-10-30 DIAGNOSIS — I25119 Atherosclerotic heart disease of native coronary artery with unspecified angina pectoris: Secondary | ICD-10-CM | POA: Insufficient documentation

## 2024-10-30 DIAGNOSIS — E66812 Obesity, class 2: Secondary | ICD-10-CM | POA: Insufficient documentation

## 2024-10-30 DIAGNOSIS — Z6835 Body mass index (BMI) 35.0-35.9, adult: Secondary | ICD-10-CM | POA: Insufficient documentation

## 2024-10-30 DIAGNOSIS — F4310 Post-traumatic stress disorder, unspecified: Secondary | ICD-10-CM | POA: Insufficient documentation

## 2024-10-30 LAB — FOLATE: FOLATE: 7.7 ng/mL (ref 7.0–31.0)

## 2024-10-30 LAB — LIPID PANEL
CHOL/HDL RATIO: 3.5
CHOLESTEROL: 132 mg/dL (ref 100–200)
HDL CHOL: 38 mg/dL — ABNORMAL LOW (ref >=50)
LDL CALC: 65 mg/dL (ref <100)
NON-HDL: 94 mg/dL (ref <=190)
TRIGLYCERIDES: 169 mg/dL — ABNORMAL HIGH (ref <150)
VLDL CALC: 25 mg/dL (ref <30)

## 2024-10-30 LAB — THYROID STIMULATING HORMONE (SENSITIVE TSH): TSH: 1.809 u[IU]/mL (ref 0.350–4.940)

## 2024-10-30 LAB — FERRITIN: FERRITIN: 54 ng/mL (ref 5–200)

## 2024-10-30 LAB — HGA1C (HEMOGLOBIN A1C WITH EST AVG GLUCOSE)
ESTIMATED AVERAGE GLUCOSE: 120 mg/dL
HEMOGLOBIN A1C: 5.8 % — ABNORMAL HIGH (ref 4.0–5.6)

## 2024-10-30 LAB — IRON: IRON: 58 ug/dL (ref 45–170)

## 2024-10-30 LAB — VITAMIN D 25 TOTAL: VITAMIN D 25, TOTAL: 19.8 ng/mL — ABNORMAL LOW (ref 20.0–100.0)

## 2024-10-30 LAB — VITAMIN B12: VITAMIN B 12: 150 pg/mL — ABNORMAL LOW (ref 200–900)

## 2024-10-30 MED ORDER — TRAMADOL 50 MG TABLET: 1.0000 | ORAL_TABLET | Freq: Three times a day (TID) | ORAL | 1 refills | Status: AC | PRN

## 2024-10-30 NOTE — Progress Notes (Signed)
 [facility]    Metabolic and Weight Loss Surgery  Operated by [facility]      Bariatric New Patient Initial Nutrition Assessment   Patient Name:[name]   MRN: [id number]     DOB:09/30/1956     Age: 69 y.o.  Date of evaluation: 10/30/2024        Assessment  Subjective  [name] presents to clinic today interested in weight loss surgery. Pt reports a history of overweight/obesity beginning after knee surgery and has had progressive gain over the past few years. Complains of significant reflux, which causes her daily nausea and pain. Experiences symptoms with all intake, worse with spicy foods. Hopes that a gastric bypass surgery will resolve her symptoms and is looking forward to potentially returning to work.  PMH includes GERD, hiatal hernia, CAD, HLD, MI, and PTSD. Activity is limited by meniscus tear. Pt currently resides in her home alone. Has support from her sister that resides in [place].      Pt states that she does not eat a large amount now due to her reflux. Drinks 2 cups of coffee daily with powdered creamer. Cut back significantly on coffee in the past months. Has toast with a banana for breakfast daily. Chooses convenient or easy-to-cook items at lunch, like eggs or microwave meals. Eats microwave single-serve meals at dinner often. Also cooks beef, chicken, or turkey at home. Tries to eat fruit and vegetables daily. Snacks on a few chips in the afternoon or after dinner. Fluid intake includes 2-3 bottles of water  and 2 cups of coffee daily. Periodically drinks Sprite or cranberry juice.     Information provided from patient during clinic interview and Nutrition Questionnaire completed by patient prior to arrival.     FOOD Recall:   Breakfast- 2 cups coffee w/ powdered creamer, 2 pc toast w/ butter & banana  Lunch- [employer] meal- chicken & broccoli alfredo  Dinner- Microwave pot pie  Water  throughout the day      Tobacco use: denies    Physical Activity level:  sedentary  Readiness and motivation to make changes: fair   Impression on patient's ability/likeliness to make appropriate food choices: fair  Current readiness for surgery: fair, with further diet education and work towards bariatric lifestyle changes: fair  Patient appears to be a candidate for surgery. Patient will benefit from medically supervised nutrition education.   After discussion, Jhaniya Briski has realistic weight loss expectations. At presentation, pt has limited understanding of the needed diet and lifestyle changes to promote weight loss. Keiran Sias has a limited past nutrition knowledge base with no past exposure to nutrition focused education.     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    Chest pain 02/19/2016    Chest pain, rule out acute myocardial infarction 09/18/2024    Depression     Dyspnea on exertion     GERD (gastroesophageal reflux disease)     controlled w/med    H/O hearing loss     b/l, no aids    Headache     Hearing loss     Heart murmur 1979    benign    History of coronary angioplasty with insertion of stent  05/09/2023    Hyperlipidemia LDL goal < 100 08/29/2013    denies    Migraine     MINOR CAD (coronary artery disease) 11/10/2012    no treatment other than cholesterol medications; follows regularly with K.Dalton, [facility]    Multinodular goiter     Muscle weakness     neck     Myocardial infarction 05/09/2023    04/18/23, sees [place] cards    Neck problem     Obesity     Other chronic pain 09/19/2024    Peripheral edema     Rash     eczema    Rheumatic fever     in 1974 no complications    S/P thyroidectomy     Shortness of breath     DOE    Squamous cell carcinoma 02/06/2015    right side of neck    Thyroid  disorder     s/p part thyroidectomy    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    Tinnitus     Vaginal prolapse 2012    surgery improved significantly    Wears glasses            Surgical History:  Past Surgical History:   Procedure Laterality Date    CATARACT EXTRACTION Right 12/07/2022    CHALAZION EXCISION Left 12/22/2021    and conj lesion excision from caruncle    COLONOSCOPY N/A 07/19/2024    Performed by Fernand Dellen, MD at [facility] OR ENDO    COLONOSCOPY N/A 07/23/2019    Performed by Rachael Wallene Pao, MD at [facility] OR ENDO    COLONOSCOPY N/A 01/06/2009    Performed by Karin Cinderella FALCON, MD at [facility] OR ENDO    COLONOSCOPY WITH BIOPSY N/A 07/23/2019    Performed by Rachael Wallene Pao, MD at [facility] OR ENDO    COLONOSCOPY WITH POLYPECTOMY N/A 07/23/2019    Performed by Rachael Wallene Pao, MD at [facility] OR ENDO    CYSTOSCOPY N/A 09/24/2009    Performed by Areta Lytle Garfinkel, MD at [facility] OR 5 NORTH    CYSTOSCOPY WITH CYSTOMETROGRAM AND URODYNAMICS N/A 05/01/2018    Performed by Honor Shove, MD at [facility] OR 2 WEST    EXCISION LESION EYELID Left 02/17/2022    Performed by Leontine Rush, MD at [facility] OR 2 WEST    EYELID RETRACTION REPAIR WITH EAR CARTILAGE GRAFT Right 08/10/2016    Performed by Girtha Nest, MD at [facility] OR 2 WEST    EYELID RETRACTION REPAIR WITH EAR CARTILAGE GRAFT Bilateral 01/28/2015    Performed by Girtha Nest, MD at [facility] OR 2 WEST    GASTROSCOPY N/A 08/28/2024    Performed by Durene Lawless, MD at [facility] OR ENDO    GASTROSCOPY Left 03/05/2010    Performed by Waverly Pleas, MD at [facility] OR ENDO    GASTROSCOPY Left 12/19/2007    Performed by Zorita Rush, MD at [facility] OR ENDO    GASTROSCOPY WITH BIOPSY Left 03/05/2010    Performed by Waverly Pleas, MD at [facility] OR ENDO    HEMITHYROIDECTOMY Right 04/05/2019    Performed by Tobie Resides, MD at [facility] OR 5 NORTH    HX ADENOIDECTOMY      HX ANKLE FRACTURE TX  2007    left distal fibula, casted    HX APPENDECTOMY      HX CERVICAL SPINE SURGERY      HX CHOLECYSTECTOMY      HX COLONOSCOPY      HX  CYSTOCELE REPAIR  09/24/2009    HX HAND SURGERY  2012    for Carpal tunnel : right  side treated    HX HEART CATHETERIZATION      HX HYSTERECTOMY      HX OOPHORECTOMY      left ovary removed    HX PAROTIDECTOMY       surgical clamp     HX PARTIAL THYROIDECTOMY Right 04/05/2019    R hemithyroidectomy for nodule, final path follicular adenoma    HX TONSILLECTOMY      HX TOTAL VAGINAL HYSTERECTOMY  1979    HX WISDOM TEETH EXTRACTION      HX WRIST FRACTURE TX Left 2007    FOOSH injury    INJECTION EXCHANGE GAS Right 11/02/2021    Performed by Zilphia Verlon Ade, MD at [facility] OR 5 NORTH    LAVAGE BRONCHIAL ALVEOLAR N/A 08/11/2023    Performed by Dariel Raid, MD at [facility] OR 2W PREPOST    MANOMETRY ESOPHAGEAL N/A 01/03/2008    Performed by Zorita Rush, MD at [facility] OR 2W PREPOST    PARATHYROID  GLAND SURGERY      PAROTIDECTOMY SUPERFICIAL WITH FACIAL NERVE DISSECTION Right 02/06/2015    Performed by Tobie Resides, MD at [facility] OR 5 NORTH    PARS PLANA VITRECTOMY Right 11/02/2021    Pars plana vitrectomy, nternal limiting membrane (ILM) and epiretinal membrane (ERM) peel, Injection of 20%  sf6 gas, Subtenon's injection of Cefuroxime  50 mg and Dexamethasone  2 mg    PB REVISE ULNAR NERVE AT ELBOW Left 1979    PB UPPER GI ENDOSCOPY,BIOPSY  12/19/2007    patulous GE junction zone, erythema, nonerosive GERD    PH PROBE N/A 08/28/2024    Performed by Durene Lawless, MD at [facility] OR ENDO    [facility] PROBE N/A 01/03/2008    Performed by Zorita Rush, MD at [facility] OR 2W PREPOST    [facility] WITH INTRAOCULAR LENS Right 12/07/2022    Performed by Georgina Dunnings, MD at [facility] OR 2 WEST    RELEASE CARPAL TUNNEL Right 11/30/2010    Performed by Hiram Admire, MD at [facility] OR 2 WEST    REPAIR ANTERIOR AND POSTERIOR N/A 09/24/2009    Performed by Areta Lytle Garfinkel, MD at [facility] OR 5 NORTH    REPAIR ECTROPION BILATERAL Bilateral 01/28/2015    Performed by Girtha Nest, MD at [facility] OR 2 WEST    SEPTOPLASTY      SUSPENSION VAGINAL VAULT N/A 09/24/2009    Performed by Areta Lytle Garfinkel, MD at [facility] OR 5 NORTH    VAGINECTOMY PARTIAL N/A 09/24/2009     Performed by Areta Lytle Garfinkel, MD at [facility] OR 5 NORTH    VITRECTOMY POSTERIOR WITH MEMBRANE PEEL Right 11/02/2021    Performed by Zilphia Verlon Ade, MD at [facility] OR 5 NORTH       Current medications:   Current Outpatient Medications   Medication Sig    acetaminophen  (TYLENOL ) 500 mg Oral Tablet Take 2 Tablets (1,000 mg total) by mouth Three times a day as needed for Pain    albuterol  sulfate (PROVENTIL  OR VENTOLIN  OR PROAIR ) 90 mcg/actuation Inhalation oral inhaler Take 1-2 Puffs by inhalation Every 6 hours as needed for Other (wheezing, shortness of breath)    amoxicillin -pot clavulanate (AUGMENTIN ) 500-125 mg Oral Tablet Take 1 Tablet by mouth Twice daily for 5 days    aspirin  81 mg Oral Tablet, Chewable Chew 1 Tablet (81 mg total) Every morning  baclofen  (LIORESAL ) 5 mg Oral Tablet Take 1 Tablet (5 mg total) by mouth Three times a day    cetirizine  (ZYRTEC ) 10 mg Oral Tablet Take 1 Tablet (10 mg total) by mouth Once per day as needed for Allergies    clobetasoL  (TEMOVATE ) 0.05 % Ointment Apply topically Twice daily    desvenlafaxine  (PRISTIQ ) 100 mg Oral Tablet Sustained Release 24 hr Take 1 Tablet (100 mg total) by mouth Daily    diclofenac  sodium (VOLTAREN ) 1 % Gel Apply topically Four times a day - before meals and bedtime    famotidine  (PEPCID ) 40 mg Oral Tablet Take 1 Tablet (40 mg total) by mouth Twice daily    gabapentin  (NEURONTIN ) 800 mg Oral Tablet Take 1 Tablet (800 mg total) by mouth Twice daily Take 1 Tablet (800 mg total) by mouth Twice daily    ipratropium bromide  (ATROVENT ) 42 mcg (0.06 %) Nasal Spray, Non-Aerosol Administer 2 Sprays into affected nostril(s) Three times a day    isosorbide  mononitrate (IMDUR ) 60 mg Oral Tablet Sustained Release 24 hr Take 1 Tablet (60 mg total) by mouth Every morning    metoprolol  succinate (TOPROL -XL) 25 mg Oral Tablet Sustained Release 24 hr Take 1 Tablet (25 mg total) by mouth Daily    mirabegron  (MYRBETRIQ ) 25 mg Oral Tablet Sustained Release 24 hr Take 1  Tablet (25 mg total) by mouth Daily Take 1 Tablet (25 mg total) by mouth Daily    nitroGLYCERIN  (NITROSTAT ) 0.4 mg Sublingual Tablet, Sublingual DISSOLVE 1 TABLET UNDER THE TONGUE AS NEEDED FOR CHEST PAIN EVERY 5 MINUTES UP TO 3 TIMES. IF NO RELIEF CALL 911.    ondansetron  (ZOFRAN ) 4 mg Oral Tablet Take 1 Tablet (4 mg total) by mouth Every 8 hours as needed (or vomiting)    pantoprazole  (PROTONIX ) 40 mg Oral Tablet, Delayed Release (E.C.) Take 1 Tablet (40 mg total) by mouth Twice daily    rosuvastatin  (CRESTOR ) 40 mg Oral Tablet Take 1 Tablet (40 mg total) by mouth Every evening    sucralfate  (CARAFATE ) 100 mg/mL Oral Suspension Take 10 mL (1 g total) by mouth Every 6 hours as needed    trimethoprim -sulfamethoxazole  (BACTRIM  DS) 160-800mg  per tablet Take 1 Tablet (160 mg total) by mouth Twice daily for 3 days       Current laboratory values:     Lab Results   Component Value Date/Time    WBC 6.3 09/19/2024 04:33 AM    HGB 12.0 09/19/2024 04:33 AM    HCT 36.1 09/19/2024 04:33 AM    PLTCNT 289 09/19/2024 04:33 AM    SEDRATE 30 03/29/2008 12:42 PM    RBC 4.09 09/19/2024 04:33 AM    MCV 88.3 09/19/2024 04:33 AM    MCHC 33.2 09/19/2024 04:33 AM    MCH 29.3 09/19/2024 04:33 AM    RDW 14.5 12/12/2017 03:52 PM    MPV 8.8 09/19/2024 04:33 AM     Lab Results   Component Value Date/Time    SODIUM 140 09/19/2024 04:33 AM    POTASSIUM 3.9 09/19/2024 04:33 AM    CHLORIDE 107 09/19/2024 04:33 AM    CO2 25 09/19/2024 04:33 AM    ANIONGAP 8 09/19/2024 04:33 AM    BUN 14 09/19/2024 04:33 AM    CREATININE 1.25 (H) 09/19/2024 04:33 AM    GLUCOSE 107 09/19/2024 04:33 AM     Lab Results   Component Value Date/Time    CALCIUM  8.6 09/19/2024 04:33 AM    PHOSPHORUS 4.7 (H) 09/19/2024 04:33 AM  TOTALPROTEIN 6.7 01/26/2024 02:11 PM    AST 20 01/26/2024 02:11 PM    ALT 17 01/26/2024 02:11 PM     C-REACTIVE PROTEIN (CRP),INFLAMMATION   Date Value Ref Range Status   01/20/2010 0.640 <0.800 mg/dL Final     CRP INFLAMMATION   Date Value Ref  Range Status   07/08/2022 5.9 <8.0 mg/L Final     No results found for: FERR  No results found for: IRON  No results found for: IBC  No results found for: TRF  No results found for: FESAT  No results found for: B12  No results found for: MMA  No results found for: FOL  No results found for: MFRB2  No results found for: HMCY  No results found for: WVITA1  No results found for: WVD11  No components found for: WVE1  No results found for: WVB  No results found for: WCOPP1  No results found for: PAB  Lab Results   Component Value Date    HA1C 5.6 01/26/2024     No results found for: [id number]        Lab Results   Component Value Date    CHOLESTEROL 130 11/29/2023    HDLCHOL 44 (L) 11/29/2023    LDLCHOL 62 11/29/2023    TRIG 135 11/29/2023          FOOD allergies/food intolerances: denies    Current vitamin supplementation: none      Today's Vitals  BP (!) 153/89   Pulse 91   Temp 36.6 C (97.9 F) (Thermal Scan)   Ht 1.65 m (5' 4.96)   Wt 105 kg (231 lb 11.3 oz)   SpO2 95% Comment: ra  BMI 38.60 kg/m       Weight at BMI 25: 159 lbs  Excess body weight: 74 lbs (Excess wt = baseline wt - weight corresponding to a BMI of 25)  Average weight loss range, 60-95% of excess body weight, based on patients current weight would be a weight loss of 44-70 lbs (end weight: 163-189 lbs)    Estimated energy requirement per [employer]-St. Jeor  Formula:  Female: 10*Wt (kg) + 6.25*Ht (cm) - 5*Age(yrs) -161 =REE 1605 kcals  Multiply REE*Activity Factor (1.2) = 1926 kcal/day          Calorie Level Diet to promote weight maintenance: 1900-2000    Estimated protein needs:  1.0-1.2g/kg IBW = 61-74g/day (61kg IBW)    Diagnosis    PES statement  Overweight/obesity related to positive energy balance as evidenced by BMI 38.6.    Intervention    1. Provided 'Getting Started: Improving Habits Before Surgery' handout, Bariatric Food Guide Pyramid, and monthly nutrition progress sheet.   2. Lifestyle changes for  bariatric surgery were discussed and planned.  Patient encouraged to set monthly goals.   3. Reinforced current positive habits.  4. Discussed benefits of food tracking and/or label reading.  5. Provided nutrition booklet and class schedule. Discussed procedure for virtual class--link provided. Link sent via [facility] as well.       Monitoring and Evaluation    Patient-identified short term goals:  1. Look at protein on food labels    Further goals to be documented on Monthly Nutrition Progress Sheet    Pre-op  weight loss needed:  Maintain 231lbs    Expected time, frequency, and duration of follow-up care: Monthly for 6 months of medically supervised diet with [employer].    Vernell Rideau, MSRDLD 10/30/2024, 13:46

## 2024-10-30 NOTE — H&P (Signed)
 [facility]   Metabolic and Weight Loss Surgery   Operated by [facility]      BARIATRIC NEW PATIENT EVALUATION    Patient Name: [name]   MRN#: [id number]   DOB:  [birth year]   Date of Service: 10/30/2024   Referring Provider: Seena Domino, PA-C  [address]  [address]  [place],  [place] [address]  Primary Care Provider:Kristi Dalton, PA-C    REASON FOR CONSULTATION: The patient is seen at the request of Barbara Dalton, PA-C for evaluation of Severe obesity and medical problems caused by, or seriously aggravated by it.     HISTORY OF PRESENT ILLNESS: [name] is a 69 y.o. female who presents today to begin the process of comprehensive evaluation for bariatric and metabolic surgery.   She has tried diets in the past without long term success.     The excess body weight interferes with activities of daily living and negatively impacts quality of life.  She is interested in surgery to improve her comorbidities as well as her overall qualityof life.    Measurements In clinic today are Height: 165 cm (5' 4.96), Weight: 105 kg (231 lb 11.3 oz)  and ADJ:Anib surface area is 2.19 meters squared. Body mass index is 38.6 kg/m. Her ideal body weight is 159 pounds.  Excess body weight is 74 pounds.         REVIEW OF CO-MORBIDITIES ASSOCIATED WITH MORBID OBESITY:    Patient has been diagnosed with Type 2 Diabetes - no  Patienttaking oral antihyperglycemic medication - n/a  Patient is taking GLP-1 Medications - no  Patient taking insulin for glycemic control - n/a      Patient has been diagnosed with sleep apnea - No diagnosis but some symptoms   Patient uses CPAP/BIPAP - no    Patient has been diagnosed with hypertension - yes  Patient taking medications for hypertension - yes    Patient has been diagnosed with dyslipidemia - yes  Patient taking medications for treatment of dyslipidemia- yes    Patient has been diagnosed with GERD- Severe symptoms since age 39 ( significantly worsened, impacting her  sleep)  Patient taking medications for GERD - Not Well controlled despite multiple meds (Carafate , Protonix , and Pepcid )  Has been following up with GI, Pulmonary and was seen my thoracic surgery recently   H/O Dysphagia few times a week     Work up included:   Patient underwent EGD with pH study on 08/28/24 revealing a tortuous esophagus, small gastric 2mm polyp, and a DeMeester score of 23.5. Biopsies revealed vascular congestion and reactive changes consistent with reflux esophagitis.     CT Angio 09/13/24:   Small hiatal hernia with thickening and dilation of the esophagus    Patient uses an assistive device (cane, walker, wheelchair, scooter, etc.) - no    High Nicotine  risk - no  Quit 2006       Past medical history is significant for CAD w/ MI s/p PCI x3 (2024),Patient was recently admitted 11/25-11/26 and underwent work-up for chest pain. MPS and TTE were unremarkable. EF 73%.             The patient reports the following medical conditions:    MEDICAL CONDITIONS:  Problem List[1]     ALLERGIES  Allergies[2]    MEDICATIONS:  Current Outpatient Medications   Medication Sig    acetaminophen  (TYLENOL ) 500 mg Oral Tablet Take 2 Tablets (1,000 mg total) by mouth Three  times a day as needed for Pain    albuterol  sulfate (PROVENTIL  OR VENTOLIN  OR PROAIR ) 90 mcg/actuation Inhalation oral inhaler Take 1-2 Puffs by inhalation Every 6 hours as needed for Other (wheezing, shortness of breath)    amoxicillin -pot clavulanate (AUGMENTIN ) 500-125 mg Oral Tablet Take 1 Tablet by mouth Twice daily for 5 days    aspirin  81 mg Oral Tablet, Chewable Chew 1 Tablet (81 mg total) Every morning    baclofen  (LIORESAL ) 5 mg Oral Tablet Take 1 Tablet (5 mg total) by mouth Three times a day    cetirizine  (ZYRTEC ) 10 mg Oral Tablet Take 1 Tablet (10 mg total) by mouth Once per day as needed for Allergies    clobetasoL  (TEMOVATE ) 0.05 % Ointment Apply topically Twice daily    desvenlafaxine  (PRISTIQ ) 100 mg Oral Tablet Sustained Release 24  hr Take 1 Tablet (100 mg total) by mouth Daily    diclofenac  sodium (VOLTAREN ) 1 % Gel Apply topically Four times a day - before meals and bedtime    famotidine  (PEPCID ) 40 mg Oral Tablet Take 1 Tablet (40 mg total) by mouth Twice daily    gabapentin  (NEURONTIN ) 800 mg Oral Tablet Take 1 Tablet (800 mg total) by mouth Twice daily Take 1 Tablet (800 mg total) by mouth Twice daily    ipratropium bromide  (ATROVENT ) 42 mcg (0.06 %) Nasal Spray, Non-Aerosol Administer 2 Sprays into affected nostril(s) Three times a day    isosorbide  mononitrate (IMDUR ) 60 mg Oral Tablet Sustained Release 24 hr Take 1 Tablet (60 mg total) by mouth Every morning    metoprolol  succinate (TOPROL -XL) 25 mg Oral Tablet Sustained Release 24 hr Take 1 Tablet (25 mg total) by mouth Daily    mirabegron  (MYRBETRIQ ) 25 mg Oral Tablet Sustained Release 24 hr Take 1 Tablet (25 mg total) by mouth Daily Take 1 Tablet (25 mg total) by mouth Daily    nitroGLYCERIN  (NITROSTAT ) 0.4 mg Sublingual Tablet, Sublingual DISSOLVE 1 TABLET UNDER THE TONGUE AS NEEDED FOR CHEST PAIN EVERY 5 MINUTES UP TO 3 TIMES. IF NO RELIEF CALL 911.    ondansetron  (ZOFRAN ) 4 mg Oral Tablet Take 1 Tablet (4 mg total) by mouth Every 8 hours as needed (or vomiting)    pantoprazole  (PROTONIX ) 40 mg Oral Tablet, Delayed Release (E.C.) Take 1 Tablet (40 mg total) by mouth Twice daily    rosuvastatin  (CRESTOR ) 40 mg Oral Tablet Take 1 Tablet (40 mg total) by mouth Every evening    sucralfate  (CARAFATE ) 100 mg/mL Oral Suspension Take 10 mL (1 g total) by mouth Every 6 hours as needed    trimethoprim -sulfamethoxazole  (BACTRIM  DS) 160-800mg  per tablet Take 1 Tablet (160 mg total) by mouth Twice daily for 3 days       PAST MEDICAL HISTORY:   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    Chest pain 02/19/2016    Chest pain, rule out acute  myocardial infarction 09/18/2024    Depression     Dyspnea on exertion     GERD (gastroesophageal reflux disease)     controlled w/med    H/O hearing loss     b/l, no aids    Headache     Hearing loss     Heart murmur 1979  benign    History of coronary angioplasty with insertion of stent 05/09/2023    Hyperlipidemia LDL goal < 100 08/29/2013    denies    Migraine     MINOR CAD (coronary artery disease) 11/10/2012    no treatment other than cholesterol medications; follows regularly with K.Dalton, PA    Multinodular goiter     Muscle weakness     neck     Myocardial infarction 05/09/2023    04/18/23, sees [facility]    Neck problem     Obesity     Other chronic pain 09/19/2024    Peripheral edema     Rash     eczema    Rheumatic fever     in 1974 no complications    S/P thyroidectomy     Shortness of breath     DOE    Squamous cell carcinoma 02/06/2015    right side of neck    Thyroid  disorder     s/p part thyroidectomy    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    Tinnitus     Vaginal prolapse 2012    surgery improved significantly    Wears glasses            PAST SURGICAL HISTORY:   Past Surgical History:   Procedure Laterality Date    CATARACT EXTRACTION Right 12/07/2022    CHALAZION EXCISION Left 12/22/2021    and conj lesion excision from caruncle    COLONOSCOPY  01/06/2009    COLONOSCOPY performed by KARIN, AHMED F at [facility] OR ENDO    GASTROSCOPY  03/05/2010    GASTROSCOPY performed by WAVERLY, SWATI at [facility] OR ENDO    GASTROSCOPY WITH BIOPSY  03/05/2010    GASTROSCOPY WITH BIOPSY performed by WAVERLY PLEAS [facility] OR ENDO    HX ADENOIDECTOMY      HX ANKLE FRACTURE TX  2007    left distal fibula, casted    HX APPENDECTOMY      HX CERVICAL SPINE SURGERY      HX CHOLECYSTECTOMY      HX COLONOSCOPY      HX CYSTOCELE REPAIR  09/24/2009    HX HAND SURGERY  2012    for Carpal tunnel : right side treated    HX HEART CATHETERIZATION      HX HYSTERECTOMY      HX OOPHORECTOMY      left ovary removed    HX PAROTIDECTOMY        surgical clamp     HX PARTIAL THYROIDECTOMY Right 04/05/2019    R hemithyroidectomy for nodule, final path follicular adenoma    HX TONSILLECTOMY      HX TOTAL VAGINAL HYSTERECTOMY  1979    HX WISDOM TEETH EXTRACTION      HX WRIST FRACTURE TX Left 2007    FOOSH injury    PARATHYROID  GLAND SURGERY      PARS PLANA VITRECTOMY Right 11/02/2021    Pars plana vitrectomy, nternal limiting membrane (ILM) and epiretinal membrane (ERM) peel, Injection of 20%  sf6 gas, Subtenon's injection of Cefuroxime  50 mg and Dexamethasone  2 mg    PB REVISE ULNAR NERVE AT ELBOW Left 1979    PB UPPER GI ENDOSCOPY,BIOPSY  12/19/2007    patulous GE junction zone, erythema, nonerosive GERD    SEPTOPLASTY             FAMILY HISTORY:   family history includes Bipolar Disorder in her daughter and paternal uncle; Breast Cancer  in her paternal aunt, paternal aunt, paternal aunt, paternal aunt, paternal aunt and another family member; Cancer in her paternal aunt, paternal aunt, paternal aunt, paternal aunt, and paternal aunt; Cancer (age of onset: 48) in an other family member; Congestive Heart Failure (age of onset: 57) in her father; Coronary Artery Disease in her father; Coronary Artery Disease (age of onset: 46) in her mother; Diabetes in her mother; Heart Attack in her father, sister, and sister; High Cholesterol in her mother; Hypertension (High Blood Pressure) in her brother, mother, sister, and sister; Kidney Disease in her sister; Leukemia in her paternal uncle; Stroke in her paternal grandmother; Thyroid  Disease in her sister.    SOCIAL HISTORY:    reports that she quit smoking about 19 years ago. Her smoking use included cigarettes. She started smoking about 59 years ago. She has a 80 pack-year smoking history. She has never used smokeless tobacco. She reports that she does not drink alcohol and does not use drugs.    REVIEW OF SYSTEMS:  Constitutional: Positive for malaise, No fevers, chills    Endocrine: no H/O hypothyroidism      Respiratory:  H/O chronic respiratory issues including Bronchiectasis -      Cardiovascular: CAD w/ MI s/p PCI x3 (2024),Patient was recently admitted 11/25-11/26 and underwent work-up for chest pain. MPS and TTE were unremarkable. EF 73%.  HLD and HTN     Gastrointestinal:  See HPI.     Neurological: :  H/O Migraine  , Negative for dizziness, vertigo, seizures, memory problems    Musculoskeletal:  :  H/O Significant Joint pain and arthritis in multiple joints and back  -     Hematologic/lymphatic:  Negative for easy bruising, bleeding and DVT.     Genitourinary:  : yes H/o Stress Urinary incontinence. .  : no H/O Urinary stones     Behavioral/Psych: :  H/O Depression &  Anxiety     Screening colonoscopy: - no    Mammogram: no        PHYSICAL EXAMINATION:    BP (!) 153/89   Pulse 91   Temp 36.6 C (97.9 F) (Thermal Scan)   Ht 1.65 m (5' 4.96)   Wt 105 kg (231 lb 11.3 oz)   SpO2 95% Comment: ra  BMI 38.60 kg/m       Height: 165 cm (5' 4.96)  Weight: 105 kg (231 lb 11.3 oz)  Body mass index is 38.6 kg/m.    NEURO: Alert and oriented x3, mood and affect appropriate.  General: Well developed, No acute distress   Psychiatric: non anxious, normal affect    HENT: Normocephalic , atraumatic  Eyes: No ptosis and no extraocular movements     Respiratory: No increase respiratory effort   SKIN: Smooth and dry.  ABDOMEN: Soft, non-tender, no masses. Difficult exam given the size of the patient   MUSCULOSKELETAL: Grossly normal range of motion.   EXTREMITIES: Mild edema. No venous stasis changes.      ASSESSMENT AND PLAN:  Gyselle Matthew is a pleasant 69 y.o. year old female who presents to clinic today for evaluation for possible weight loss surgery.  She has a history of morbid obesity and other comorbidities listed above including Severe & long term GERD. Her current Body mass index is 38.6 kg/m.  Given her obesity, she is not a candidate for classic antireflux operations. Additionally, given her severe  GERD she will not be a good candidate for GLP-1 medications   There for  the best approach for her would be bariatric surgery with a gastric bypass, which would be effective in treating both her GERD and Obesity. However, insurance may not cover it with out additional OSA or DM as a comorbidity. We would recommend obtaining a sleep study to evaluate for OSA.   The patient is interested in pursuing laparoscopic gastric bypass after a careful consideration of all the offered surgical procedures and she understands all the risks and benefits of the procedure she has chosen. I discussed the risks of LRYGB, including the need to stop all NSAID use at least 3 months before weight loss surgery and commit to life long NSAID and tobacco abstinence after surgery to prevent potentially life threatening complications from marginal ulcer disease.  I also discussed the long term risk of Internal hernia as well as vitamins and Iron deficiency. The patient understands and agrees to above    In order to continue her evaluation tosee if she is a suitable candidate for surgery,we will order the following studies and consults:  1) Laboratory studies to include CBC, Comprehensive Metabolic Panel, Ferritin, Folate, HbA1c, Lipid Panel, TSH, Vitamin B12, and Vitamin D  (25-OH).  These labs will be used to evaluate for other causes of metabolic disorder including hypothyroidism,determine any vitamin or iron deficiencies that can be worsened by a metabolic operation, as well as diagnose and grade the severity of associated co-morbidities including dyslipidemia, impaired glucose metabolism, kidneydisease, and liver disease.    2) Chest X-ray to evaluate for occult lung disease  3) EKG  4) Esophagogastroduodenoscopy (EGD) and Other GRED test were reviewed.   5) Dietary evaluation for education and assessment of readiness for surgery.   6) Psychology evaluation for education and assessment of readiness for surgery.  7) Cardiac evaluation:  referral to Cardiology placed for preoperative riskstratification.  8) Pulmonary evaluation: referral to Pulmonology placed for preoperative riskstratification and evaluation for sleep apnea   9) She is required to complete 6 months of documented weight loss prior to surgery. She would like to have her surgery as soon as possible after completion of this documentation.     We will have the patient follow up with us  in clinic once all the items have been completed listed above.  All studies and tests will be reviewed prior to offering the patient a surgery date.  Patient is encouraged to callthe office with any questions or concerns in the meantime. Patient demonstrates understanding and there are no barriers to education.     Lei Holts, MD 10/30/2024 12:13  Division ofBariatric Surgery  [place]       ATTESTATION:   I, Lei Holts, MD, personally performed the above documented history, physical exam, assessment and formulated the above stated plan.  Zero percentresident/fellow participation in ANY part of the services.               [1]   Patient Active Problem List  Diagnosis    Migraine Headaches    GERD not well controlled    Patellofemoral pain syndrome    Multinodular goiter    Microscopic hematuria    Neck pain    Lichen sclerosus et atrophicus of the vulva    Carpal tunnel syndrome on right    Osteoarthritis, knee    Left knee pain    Coronary artery disease involving native coronary artery of native heart with angina pectoris (CMS HCC)    Hyperlipidemia LDL goal < 100    Otitis media of both ears  Mass of parotid gland, right    Squamous cell carcinoma    Chest pain    Dysuria    Multiple pulmonary nodules determined by computed tomography of lung    History of rheumatic fever    Neuropathy (CMS HCC)    PTSD (post-traumatic stress disorder)    Pain due to neuropathy of facial nerve    S/P thyroidectomy    GAD (generalized anxiety disorder)    Severe episode of recurrent major depressive  disorder, 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)    Right hip pain    Hand pain    Cervical neck pain with evidence of disc disease    Lumbar pain with radiation down right leg    History of coronary angioplasty with insertion of stent    Myocardial infarction    Severe obesity (BMI 35.0-35.9 with comorbidity) (CMS HCC)    Shoulder pain    Bronchiectasis without complication (CMS HCC)    OAB (overactive bladder)    Stress incontinence    Cough    Hiatal hernia    Ground glass opacity present on imaging of lung    IgG2 subclass deficiency (CMS HCC)    Screening for colorectal cancer    Personal history of adenomatous and serrated colon polyps    Elevated serum creatinine    Other chronic pain    Bladder irritation    CKD (chronic kidney disease) stage 3, GFR 30-59 ml/min   [2]   Allergies  Allergen Reactions    Cymbalta  [Duloxetine ]      Rash on feet and hands    Blue Dye      Told to avoid due to patch test result by [facility]    Flavoring Agent  Other Adverse Reaction (Add comment)     Patient unsure of reaction. Found on skin test    Lipitor [Atorvastatin ] Myalgia     Muscle pain    Lisinopril  Other Adverse Reaction (Add comment)     Cough      Nickel      Blisters  Allergic to all kinds of metal     Codeine  Itching     RASH    Mobic  [Meloxicam ] Nausea/ Vomiting     Nervous and shakes     Naprosyn  [Naproxen ] Nausea/ Vomiting    Nsaids (Non-Steroidal Anti-Inflammatory Drug) Nausea/ Vomiting

## 2024-10-30 NOTE — Nursing Note (Signed)
 Faxed signed prescription clarification documentation to [facility] [phone]).  Fax confirmation received.

## 2024-10-31 ENCOUNTER — Ambulatory Visit (HOSPITAL_COMMUNITY): Payer: Self-pay | Admitting: THORACIC SURGERY CARDIOTHORACIC VASCULAR SURGERY

## 2024-10-31 ENCOUNTER — Ambulatory Visit (INDEPENDENT_AMBULATORY_CARE_PROVIDER_SITE_OTHER): Payer: Self-pay

## 2024-10-31 ENCOUNTER — Encounter (HOSPITAL_BASED_OUTPATIENT_CLINIC_OR_DEPARTMENT_OTHER): Payer: Self-pay

## 2024-10-31 NOTE — Telephone Encounter (Signed)
 Regarding: Clinical Question  ----- Message from Lisman B sent at 10/31/2024 10:46 AM EST -----  Copied From CRM #[id number].  [name] (Self) called with a clinical question.     Dr Jama,     Pt was told she would need to have a sleep study completed before she could be cleared for surgery.  Can this be ordered for her and then notified so she can schedule?

## 2024-10-31 NOTE — Telephone Encounter (Signed)
 Call to pt to scheduled preop clearance appt for bariatric surgery and discuss ordering sleep study per Dr. Jama. Appt made. Pt gave verbal confirmation of date and time of appt.

## 2024-11-01 ENCOUNTER — Other Ambulatory Visit (INDEPENDENT_AMBULATORY_CARE_PROVIDER_SITE_OTHER): Payer: Self-pay | Admitting: PHYSICIAN ASSISTANT

## 2024-11-01 ENCOUNTER — Ambulatory Visit (INDEPENDENT_AMBULATORY_CARE_PROVIDER_SITE_OTHER): Payer: Self-pay | Admitting: PHYSICIAN ASSISTANT

## 2024-11-01 DIAGNOSIS — E538 Deficiency of other specified B group vitamins: Secondary | ICD-10-CM

## 2024-11-01 DIAGNOSIS — E559 Vitamin D deficiency, unspecified: Secondary | ICD-10-CM

## 2024-11-06 ENCOUNTER — Encounter (INDEPENDENT_AMBULATORY_CARE_PROVIDER_SITE_OTHER): Payer: Self-pay

## 2024-11-06 ENCOUNTER — Ambulatory Visit: Payer: Self-pay | Attending: Student in an Organized Health Care Education/Training Program

## 2024-11-06 ENCOUNTER — Other Ambulatory Visit (INDEPENDENT_AMBULATORY_CARE_PROVIDER_SITE_OTHER): Payer: Self-pay | Admitting: PHYSICIAN ASSISTANT

## 2024-11-06 ENCOUNTER — Other Ambulatory Visit: Payer: Self-pay

## 2024-11-06 VITALS — BP 133/83 | HR 95 | Temp 97.3°F | Ht 65.0 in | Wt 234.0 lb

## 2024-11-06 DIAGNOSIS — K449 Diaphragmatic hernia without obstruction or gangrene: Secondary | ICD-10-CM

## 2024-11-06 DIAGNOSIS — K219 Gastro-esophageal reflux disease without esophagitis: Secondary | ICD-10-CM

## 2024-11-06 DIAGNOSIS — R918 Other nonspecific abnormal finding of lung field: Secondary | ICD-10-CM

## 2024-11-06 DIAGNOSIS — J479 Bronchiectasis, uncomplicated: Secondary | ICD-10-CM

## 2024-11-06 DIAGNOSIS — G479 Sleep disorder, unspecified: Secondary | ICD-10-CM

## 2024-11-06 DIAGNOSIS — Z6835 Body mass index (BMI) 35.0-35.9, adult: Secondary | ICD-10-CM | POA: Insufficient documentation

## 2024-11-06 DIAGNOSIS — G4733 Obstructive sleep apnea (adult) (pediatric): Secondary | ICD-10-CM | POA: Insufficient documentation

## 2024-11-06 MED ORDER — ERGOCALCIFEROL (VITAMIN D2) 1,250 MCG (50,000 UNIT) CAPSULE: 50000.0000 [IU] | ORAL_CAPSULE | ORAL | 0 refills | Status: AC

## 2024-11-06 NOTE — Progress Notes (Signed)
 [facility]   Pulmonary Clinic Note  Date: 11/06/2024  Name: [name]  MRN: [id number]     Chief complaint: Pre-op     Last clinic visit: 10/01/2024    History of Present Illness  [name] is a 69 year old female with severe acid reflux who presents with worsening symptoms and poor response to current treatment.    She experiences severe acid reflux that has significantly worsened, impacting her sleep and ability to eat. The sensation is described as 'like somebody's taking a lighter' to her insides. Despite being on Carafate , Protonix , and Pepcid , her symptoms persist, and she often finds herself sitting up to sleep without relief. She uses Tums and cough drops frequently to manage symptoms.    Approximately one month ago, she underwent an upper endoscopy and a pH study, which confirmed significant acid reflux. The endoscopy revealed a tortuous esophagus and a small polyp, but no bleeding or significant abnormalities in the stomach or duodenum. Biopsies showed vascular congestion and reactive changes consistent with reflux esophagitis, but no eosinophilic esophagitis, dysplasia, or cancer.    She has a history of bronchiectasis. She uses albuterol  two to three times daily, which provides some relief, but other inhalers have been ineffective.    She mentions a history of a hiatal hernia, which may be contributing to her reflux symptoms. She has also experienced episodes of high blood pressure, with a recent reading of 109/101 mmHg. She recalls a recent emergency room visit for chest pain, where a CT scan was performed, but she was not informed of the results.    No coughing up of sputum is reported. She experiences difficulty with vocal cord breathing exercises due to severe coughing.     Interval History:  Presenting for pre-op  clearance for bariatric surgery. Since last seen her symptoms of chronic cough have not resolved. Likely in the setting of uncontrolled GERD, and bariatric surgery should  help down the line with symptom alleviation. Currently taking albuterol  as needed. Endorses consistent cough daily. Denies, fevers, chills, dyspnea. No further concerns.    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, slightly increased from 2021  Smoking:  2 ppd for 40 years (80 pack year) smoking history, quit in 2006  Occupation:  [employer]itting [employer], [employer], [employer]  Exposures:  chemical dye inhalation, allergic to blue dye and metals  Rheumatologic:  none  Travel:  none  Pets:  none, daughter had birds  Family history:  granddaughter with asthma  mMRC dyspnea score today:  2     Pulmonary medications:  Albuterol  PRN  Atrovent  and Flonase  nasal sprays    Pulmonary vaccinations:   Pulmonary vaccines due   Topic Date Due    Covid-19 Vaccine (Shared decision making) (4 - 2025-26 season) 06/25/2024     COVID vaccine Patient plans to receive elsewhere     Physical Exam  VITALS: BP 133/83   Pulse 95   Temp 36.3 C (97.3 F)   Ht 1.651 m (5' 5)   Wt 106 kg (234 lb)   SpO2 97%   BMI 38.94 kg/m      GENERAL: Alert, cooperative, well developed, no acute distress  HEENT: Normocephalic, normal oropharynx, moist mucous membranes  CHEST: Clear to auscultation bilaterally, no wheezes, rhonchi,  or crackles  CARDIOVASCULAR: Normal heart rate and rhythm  ABDOMEN: Soft, non-distended  EXTREMITIES: No edema  NEUROLOGICAL: Alert, oriented    Results  LABS  Hgb 12  Eos undetected  RAST negative  IgE undetected    RADIOLOGY  Chest CTA PE: no pulmonary embolism, subpleural GGO in LLL, 5 mm prev 8 mm, small hiatal hernia, esophageal thickening (09/24/2024)    DIAGNOSTIC  Upper endoscopy: Tortuous esophagus, single 2 mm polyp, no bleeding, normal stomach and duodenum (08/28/2024)  pH  monitoring: Positive with acid reflux score, 7% acid reflux, no non-acid reflux (08/28/2024)    PATHOLOGY  Esophageal biopsy: Vascular congestion, reactive changes, no inflammatory infiltrates, negative for dysplasia or malignancy, 2 eosinophils per high power field (08/28/2024)    ARISCAT (CANET)    AGE:  <50: (0 points)  51 to 80 (3 points)  >80 (16 points)    Pre-Operative Oxygen Saturation:  >96% (0 points)  91 to 95% (8 points)  <90% (24 points)    Other Clinical Respiratory Risk Factors:  Recent respiratory illness in past 1 month (17 points)  Pre-operative HgB <10 (11 points)  Emergency Surgery (8 points)    Surgical Incision:  Upper abdominal (15 points)  Intrathoracic (24 points)    Duration of Surgery:  <2 hours (0 points)  2 to 3 hours (16 points)  >3 hours (23 points)    Total criteria point count:     0 to 25 points: Low risk: 1.6% pulmonary complication rate   26 to 44 points: (34) Intermediate risk: 13.3% pulmonary complication rate   45 to 123 points: High risk: 42.1% pulmonary complication rate     Assessment & Plan  Gastroesophageal reflux disease with severe symptoms and hiatal hernia  Severe gastroesophageal reflux disease with significant impact on quality of life, including sleep disturbances and chronic cough. Current medications (Carafate , Protonix , Pepcid ) are ineffective. Recent endoscopy and pH study confirm significant acid reflux. Presence of a hiatal hernia contributing to symptoms. Surgical intervention considered due to severity and impact on lung health.  - Sees thoracic surgery for evaluation of hiatal hernia and potential surgical correction, who referred to bariatric surgery for reflux and weight loss management, being cleared for bypass over the next six months: Intermediate risk for procedure based on ARISCAT score for post op pulmonary complications, specific calculation detailed above.  - Continue current medications (Carafate , Protonix , Pepcid ) until surgical consultation.  -  Recommend follow up with GI for severe GERD    Concern for obstructive sleep apnea  Epworth sleepiness scale score of 8, STOP BANG 4, low to intermediate risk based on scoring combined with clinical picture of apneic spells at night  - Referral to sleep medicine  - Split night sleep study ordered, per bariatric surgery    Bronchiectasis with chronic cough  Chronic cough likely secondary to gastroesophageal reflux disease and aspiration of gastric contents. Bronchiectasis with scarring in the lungs, likely exacerbated by reflux. Current management includes albuterol  inhaler, which provides symptomatic relief.  - Continue albuterol  inhaler as needed for symptomatic relief.  - Evaluated by thoracic surgery for evaluation of potential surgical intervention     Abnormal lung imaging findings  Abnormal lung imaging findings consistent with bronchiectasis and scarring, likely due to chronic aspiration from gastroesophageal reflux disease. Recent CT scan performed in the ER due to chest pain, but new imaging is not required at this time.    Return to clinic: June 2026, with Dr. Jama  Patient seen and discussed  with attending physician: Dr. Lucendia    Orders Placed This Encounter    Refer to Sleep Disorder POC (Med Sp)    POLYSOMNOGRAPHY - ANP OVERNIGHT - 1ST NIGHT OF STUDY (F/U WITH CPAP IF ANP MEETS CLINICAL CRITERIA)   .  Charolotte Elvie Gardener, MD  Fellow, Pulmonary & Critical Care Medicine  Department of Medicine      11/06/2024  I saw and examined the patient.  I reviewed the fellow's note.  I agree with the findings and plan of care as documented in the fellow's note.  Any exceptions/additions are edited/noted.    Sena Lucendia, MD

## 2024-11-07 ENCOUNTER — Other Ambulatory Visit: Payer: Self-pay

## 2024-11-08 ENCOUNTER — Ambulatory Visit (HOSPITAL_COMMUNITY): Admitting: Student in an Organized Health Care Education/Training Program

## 2024-11-08 VITALS — BP 149/78 | HR 85 | Resp 18 | Ht 65.0 in | Wt 233.5 lb

## 2024-11-08 DIAGNOSIS — F411 Generalized anxiety disorder: Secondary | ICD-10-CM

## 2024-11-08 DIAGNOSIS — F332 Major depressive disorder, recurrent severe without psychotic features: Secondary | ICD-10-CM

## 2024-11-08 DIAGNOSIS — F4310 Post-traumatic stress disorder, unspecified: Secondary | ICD-10-CM

## 2024-11-08 MED ORDER — DESVENLAFAXINE SUCCINATE ER 100 MG TABLET,EXTENDED RELEASE 24 HR: 100.0000 mg | ORAL_TABLET | Freq: Every day | ORAL | 1 refills | Status: AC

## 2024-11-08 MED ORDER — BUPROPION HCL XL 150 MG 24 HR TABLET, EXTENDED RELEASE: 150.0000 mg | ORAL_TABLET | Freq: Every day | ORAL | 2 refills | Status: AC

## 2024-11-08 MED ORDER — BUPROPION HCL XL 150 MG 24 HR TABLET, EXTENDED RELEASE: 150.0000 mg | ORAL_TABLET | Freq: Every day | ORAL | 0 refills | Status: AC
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
## 2024-11-11 NOTE — Progress Notes (Signed)
 Department of Family Medicine   History and Physical      [name]  MRN: [id number]  DOB: 1956-06-15  Date of Service: 10/29/2024    CHIEF COMPLAINT  Chief Complaint   Patient presents with    Annual Wellness Exam    Medicare Annual    Knee Problem       HISTORY OF PRESENT ILLNESS  [name] is a 69 y.o. female presenting to clinic for her annual exam and medicare wellness visit.     PMH is positive for CAD, hyperlipidemia, bronchiectasis, neuropathy, CKD, GERD, IgG2 subclass deficiency, arthritis, chronic pain, GERD, PTSD, and MDD.    She is following with cardiology for management of CAD.  She has been stable on her current regimen of aspirin , rosuvastatin  40 mg, metoprolol  25 mg, and Imdur  60 mg.  She has not had any recent episodes of chest.  No exertional dyspnea due to cardiac cause.      She is still struggling with her chronic cough.  She is following with ENT for IgG 2 subclass deficiency.  She also underwent a nerve block with them to help control her cough.  She explains that she is anticipating some kind of immuno therapy in the near future.      She is also following with pulmonology for bronchiectasis.  Her current inhaler regimen consists of Atrovent  and albuterol  p.r.n.SABRA    She follows closely with Psychiatry for management of for her mood.  She is currently taking Wellbutrin  and Pristiq .  Her mood has been impacted by her pain which has been poorly controlled.    She did see Orthopedics for evaluation of chronic knee pain.  There was discussion of doing an exploratory arthroscopy to cleanup left meniscal tear and debris vs partial knee replacement.  Patient states that she chose to do the exploratory arthroscopy, but is still awaiting scheduling.  She does take Tylenol  and gabapentin  800 mg b.i.d..  Does not feel that the gabapentin  helps her arthritis pain and just makes her sleepy.  Her pain has been her largest complaint recently and has impacted her quality of life due to  affecting her ADLs and her sleep.      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    Chest pain 02/19/2016    Chest pain, rule out acute myocardial infarction 09/18/2024    Depression     Dyspnea on exertion     GERD (gastroesophageal reflux disease)     controlled w/med    H/O hearing loss     b/l, no aids    Headache     Hearing loss     Heart murmur 1979    benign    History of coronary angioplasty with insertion of stent 05/09/2023    Hyperlipidemia LDL goal < 100 08/29/2013    denies    Migraine     MINOR CAD (coronary artery disease) 11/10/2012    no treatment other than cholesterol medications; follows regularly with K.Dalton, PA    Multinodular goiter     Muscle weakness     neck     Myocardial infarction 05/09/2023    04/18/23, sees [place] [facility]    Neck problem  Obesity     Other chronic pain 09/19/2024    Peripheral edema     Rash     eczema    Rheumatic fever     in 1974 no complications    S/P thyroidectomy     Shortness of breath     DOE    Squamous cell carcinoma 02/06/2015    right side of neck    Thyroid  disorder     s/p part thyroidectomy    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    Tinnitus     Vaginal prolapse 2012    surgery improved significantly    Wears glasses            MEDICATIONS  acetaminophen  (TYLENOL ) 500 mg Oral Tablet, Take 2 Tablets (1,000 mg total) by mouth Three times a day as needed for Pain  albuterol  sulfate (PROVENTIL  OR VENTOLIN  OR PROAIR ) 90 mcg/actuation Inhalation oral inhaler, Take 1-2 Puffs by inhalation Every 6 hours as needed for Other (wheezing, shortness of breath)  amoxicillin -pot clavulanate (AUGMENTIN ) 500-125 mg Oral Tablet, Take 1 Tablet by mouth Twice daily for 5 days  aspirin  81 mg Oral Tablet, Chewable, Chew 1 Tablet (81 mg total) Every morning  baclofen  (LIORESAL ) 5 mg Oral Tablet,  Take 1 Tablet (5 mg total) by mouth Three times a day  cetirizine  (ZYRTEC ) 10 mg Oral Tablet, Take 1 Tablet (10 mg total) by mouth Once per day as needed for Allergies  clobetasoL  (TEMOVATE ) 0.05 % Ointment, Apply topically Twice daily  diclofenac  sodium (VOLTAREN ) 1 % Gel, Apply topically Four times a day - before meals and bedtime  famotidine  (PEPCID ) 40 mg Oral Tablet, Take 1 Tablet (40 mg total) by mouth Twice daily  gabapentin  (NEURONTIN ) 800 mg Oral Tablet, Take 1 Tablet (800 mg total) by mouth Twice daily Take 1 Tablet (800 mg total) by mouth Twice daily  ipratropium bromide  (ATROVENT ) 42 mcg (0.06 %) Nasal Spray, Non-Aerosol, Administer 2 Sprays into affected nostril(s) Three times a day  isosorbide  mononitrate (IMDUR ) 60 mg Oral Tablet Sustained Release 24 hr, Take 1 Tablet (60 mg total) by mouth Every morning  metoprolol  succinate (TOPROL -XL) 25 mg Oral Tablet Sustained Release 24 hr, Take 1 Tablet (25 mg total) by mouth Daily  mirabegron  (MYRBETRIQ ) 25 mg Oral Tablet Sustained Release 24 hr, Take 1 Tablet (25 mg total) by mouth Daily Take 1 Tablet (25 mg total) by mouth Daily  nitroGLYCERIN  (NITROSTAT ) 0.4 mg Sublingual Tablet, Sublingual, DISSOLVE 1 TABLET UNDER THE TONGUE AS NEEDED FOR CHEST PAIN EVERY 5 MINUTES UP TO 3 TIMES. IF NO RELIEF CALL 911.  ondansetron  (ZOFRAN ) 4 mg Oral Tablet, Take 1 Tablet (4 mg total) by mouth Every 8 hours as needed (or vomiting)  pantoprazole  (PROTONIX ) 40 mg Oral Tablet, Delayed Release (E.C.), Take 1 Tablet (40 mg total) by mouth Twice daily  rosuvastatin  (CRESTOR ) 40 mg Oral Tablet, Take 1 Tablet (40 mg total) by mouth Every evening  sucralfate  (CARAFATE ) 100 mg/mL Oral Suspension, Take 10 mL (1 g total) by mouth Every 6 hours as needed  trimethoprim -sulfamethoxazole  (BACTRIM  DS) 160-800mg  per tablet, Take 1 Tablet (160 mg total) by mouth Twice daily for 3 days  desvenlafaxine  (PRISTIQ ) 100 mg Oral Tablet Sustained Release 24 hr, Take 1 Tablet (100 mg total) by mouth  Daily    No facility-administered medications prior to visit.      ALLERGIES  Allergies[1]    PAST SURGICAL HISTORY  Past Surgical History:   Procedure Laterality Date  CATARACT EXTRACTION Right 12/07/2022    CHALAZION EXCISION Left 12/22/2021    and conj lesion excision from caruncle    COLONOSCOPY  01/06/2009    COLONOSCOPY performed by KARIN, AHMED F at [facility] OR ENDO    GASTROSCOPY  03/05/2010    GASTROSCOPY performed by WAVERLY PLEAS at [facility] OR ENDO    GASTROSCOPY WITH BIOPSY  03/05/2010    GASTROSCOPY WITH BIOPSY performed by WAVERLY PLEAS at [facility] OR ENDO    HX ADENOIDECTOMY      HX ANKLE FRACTURE TX  2007    left distal fibula, casted    HX APPENDECTOMY      HX CERVICAL SPINE SURGERY      HX CHOLECYSTECTOMY      HX COLONOSCOPY      HX CYSTOCELE REPAIR  09/24/2009    HX HAND SURGERY  2012    for Carpal tunnel : right side treated    HX HEART CATHETERIZATION      HX HYSTERECTOMY      HX OOPHORECTOMY      left ovary removed    HX PAROTIDECTOMY       surgical clamp     HX PARTIAL THYROIDECTOMY Right 04/05/2019    R hemithyroidectomy for nodule, final path follicular adenoma    HX TONSILLECTOMY      HX TOTAL VAGINAL HYSTERECTOMY  1979    HX WISDOM TEETH EXTRACTION      HX WRIST FRACTURE TX Left 2007    FOOSH injury    PARATHYROID  GLAND SURGERY      PARS PLANA VITRECTOMY Right 11/02/2021    Pars plana vitrectomy, nternal limiting membrane (ILM) and epiretinal membrane (ERM) peel, Injection of 20%  sf6 gas, Subtenon's injection of Cefuroxime  50 mg and Dexamethasone  2 mg    PB REVISE ULNAR NERVE AT ELBOW Left 1979    PB UPPER GI ENDOSCOPY,BIOPSY  12/19/2007    patulous GE junction zone, erythema, nonerosive GERD    SEPTOPLASTY             IMMUNIZATIONS  Immunization History   Administered Date(s) Administered    ABRYSVO (ADULT RSV) 04/12/2024    Covid-19 Vaccine,Pfizer-BioNTech,Purple Top,74yrs+ 01/11/2020, 02/01/2020, 10/29/2020    DIPTH,PERTUSSIS-ACEL,TETANUS >10 YRS OLD 06/04/2010    H1n1 Flu Vaccine Injection  (Admin) 10/10/2008    High-Dose Influenza Vaccine, 65+ (FLUZONE HD) 10/07/2023, 06/27/2024    INFLUENZA VIRUS VACCINE (ADMIN) 09/11/2001, 07/24/2012, 10/11/2013    Influenza Vaccine, 6 month-adult 09/09/2010, 09/07/2011, 07/24/2012, 10/11/2013, 08/19/2014, 08/28/2015, 06/19/2016, 07/14/2017, 07/20/2018, 07/12/2019, 06/10/2020, 08/07/2021    Influenza Vaccine, 65+ (FLUAD) 07/16/2022, 08/03/2023    PREVNAR 20 08/29/2023    Pneumovax 03/29/2013, 10/12/2024    Shingrix  - Zoster Vaccine 04/04/2019, 07/05/2019    Tetanus,Diptheria,Pertussis(BOOSTRIX) 06/04/2010, 03/12/2020       FAMILY HISTORY  Family Medical History:       Problem Relation (Age of Onset)    Bipolar Disorder Daughter, Paternal Uncle    Breast Cancer Paternal Aunt, Paternal Aunt, Paternal Aunt, Paternal Aunt, Paternal Aunt, Other    Cancer Paternal Aunt, Paternal Aunt, Paternal Aunt, Paternal Aunt, Paternal Aunt, Other (56)    Congestive Heart Failure Father (50)    Coronary Artery Disease Mother (58), Father    Diabetes Mother    Heart Attack Father, Sister, Sister    High Cholesterol Mother    Hypertension (High Blood Pressure) Mother, Sister, Sister, Brother    Kidney Disease Sister    Leukemia Paternal Uncle    Stroke Paternal Grandmother  Thyroid  Disease Sister              SOCIAL HISTORY  Social History     Socioeconomic History    Marital status: Divorced    Number of children: 3   Occupational History    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     Comment: occasionally   Tobacco Use    Smoking status: Former     Current packs/day: 0.00     Average packs/day: 2.0 packs/day for 40.0 years (80.0 ttl pk-yrs)     Types: Cigarettes     Start date: 01/22/1965     Quit date: 01/22/2005     Years since quitting: 19.8    Smokeless tobacco: Never   Vaping Use    Vaping status: Never Used   Substance and Sexual Activity    Alcohol use: No     Alcohol/week: 0.0 standard drinks of alcohol    Drug use: No    Sexual activity: Yes      Partners: Male   Other Topics Concern    Uses Cane No    Uses walker No    Uses wheelchair No    Right hand dominant Yes    Left hand dominant No    Ambidextrous No    Ability to Walk 1 Flight of Steps without SOB/CP Yes    Routine Exercise No    Ability to Walk 2 Flight of Steps without SOB/CP Yes     Comment: SOB    Total Care No    Ability To Do Own ADL's Yes    Uses Walker No    Other Activity Level Yes     Comment: housework     Uses Cane No    Shift Work No    Unusual Sleep-Wake Schedule No   Social History Narrative    Right handed.      Social Determinants of Health     Social Connections: Medium Risk (09/18/2024)    Social Connections     SDOH Social Isolation: 3 to 5 times a week       REVIEW OF SYSTEMS  Positive ROS discussed in HPI, otherwise all other systems negative.      PHYSICAL EXAM  Vitals: Blood pressure (!) 104/54, pulse 95, temperature 36.6 C (97.9 F), temperature source Thermal Scan, height 1.671 m (5' 5.79), weight 106 kg (233 lb 11 oz), SpO2 94%, not currently breastfeeding. Body mass index is 37.96 kg/m.  General: appears stated age, no acute distress  HEENT: conjunctiva clear; pupils equal and round; normal appearing TMs bilaterally; mouth mucus membranes moist; pharynx appears normal without exudate or erythema  Neck: no thyromegaly or lymphadenopathy  Cardiovascular: RRR, no murmur, no carotid bruits  Lungs: clear to auscultation bilaterally  Abdomen: soft, non-tender, bowel sounds normal  Extremities: no cyanosis or edema  Skin: no rashes or lesions  Neurologic: gait is antalgic, CN 2-12 grossly intact, AOx3  Psychiatric: normal affect and behavior    ASSESSMENT AND PLAN  (Z00.00) Annual physical exam  (primary encounter diagnosis)  Plan:   Health maintenance reviewed   Medications reviewed and reconciled    (N18.30) CKD (chronic kidney disease) stage 3, GFR 30-59 ml/min  Plan:   Chronic, stable   Will monitor BMP   Continue risk factor optimization  BP could not tolerate ACEI or  ARB therapy at this time  Could consider SGLT2 inhibitor if condition worsens      (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.3) IgG2 subclass  deficiency (CMS HCC)  Plan:   Chronic, stable   Follow up with ENT as recommended    (J47.9) Bronchiectasis without complication (CMS HCC)  Plan:   Chronic, stable   Continue Atrovent   Follow-up with pulmonology as recommended    (F33.2) Severe episode of recurrent major depressive disorder, 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)  Plan:   Chronic, stable   Continue Pristiq  and Wellbutrin  as prescribed   Defer management to Psychiatry    (E66.01,  Z68.35) Severe obesity (BMI 35.0-35.9 with comorbidity) (CMS HCC)  Plan:   Chronic, uncontrolled  Aware of lifestyle changes that need made, but limited by arthritis   Will re-address once her orthopedic condition improves     (M23.301) Degenerative tear of lateral meniscus, left  (M19.90) Osteoarthritis, unspecified osteoarthritis type, unspecified site  Plan:   Chronic, symptomatically uncontrolled   Awaiting date to schedule her left knee arthroscopy  Discussed option of trying tramadol  50 mg nightly p.r.n. to help with sleep   Encouraged to used very sparingly to help with pain management since she should not take chronic NSAIDs with recent cardiac history    (I25.119) Coronary artery disease involving native coronary artery of native heart with angina pectoris (CMS HCC)  Plan:   Chronic, stable  Continue current regimen of metoprolol , aspirin , Imdur , and rosuvastatin   Defer management to Cardiology    (Z13.820) Osteoporosis screening  Plan: BONE DENSITOMETRY COMPARISON          (Z12.31) Breast cancer screening by mammogram  Plan: MAMMO BILATERAL SCREENING-ADDL VIEWS/BREAST US          AS REQ BY RAD              Health Maintenance: Pending and Last Completed         Date Due Completion Date    Covid-19 Vaccine (Shared decision making) (4 - 2025-26 season) 06/25/2024 10/29/2020    Osteoporosis screening 10/05/2024 10/05/2022    Medicare Annual Wellness Visit - Calendar  Year Insurers 10/25/2024 12/02/2023    Breast Cancer Screening 01/29/2025 01/30/2024    Colonoscopy 07/20/2027 07/19/2024    Tetanus-Diptheria Vaccines (4 - Td or Tdap) 03/12/2030 03/12/2020                Orders Placed This Encounter    MAMMO BILATERAL SCREENING-ADDL VIEWS/BREAST US  AS REQ BY RAD    BONE DENSITOMETRY COMPARISON    traMADoL  (ULTRAM ) 50 mg Oral Tablet       Return in about 6 months (around 04/28/2025).        Vincen Bejar Dalton, PA-C 10/29/2024, 21:03         [1]   Allergies  Allergen Reactions    Cymbalta  [Duloxetine ]      Rash on feet and hands    Blue Dye      Told to avoid due to patch test result by Elita LUNA    Flavoring Agent  Other Adverse Reaction (Add comment)     Patient unsure of reaction. Found on skin test    Lipitor [Atorvastatin ] Myalgia     Muscle pain    Lisinopril  Other Adverse Reaction (Add comment)     Cough      Nickel      Blisters  Allergic to all kinds of metal     Codeine  Itching     RASH    Mobic  [Meloxicam ] Nausea/ Vomiting     Nervous and shakes     Naprosyn  [Naproxen ] Nausea/ Vomiting    Nsaids (Non-Steroidal Anti-Inflammatory Drug)  Nausea/ Vomiting

## 2024-11-13 ENCOUNTER — Encounter (INDEPENDENT_AMBULATORY_CARE_PROVIDER_SITE_OTHER): Payer: Self-pay | Admitting: PHYSICIAN ASSISTANT

## 2024-11-19 ENCOUNTER — Other Ambulatory Visit (INDEPENDENT_AMBULATORY_CARE_PROVIDER_SITE_OTHER): Payer: Self-pay | Admitting: Internal Medicine

## 2024-11-19 DIAGNOSIS — K449 Diaphragmatic hernia without obstruction or gangrene: Secondary | ICD-10-CM

## 2024-11-19 DIAGNOSIS — J479 Bronchiectasis, uncomplicated: Secondary | ICD-10-CM

## 2024-11-19 DIAGNOSIS — Z6835 Body mass index (BMI) 35.0-35.9, adult: Secondary | ICD-10-CM

## 2024-11-19 DIAGNOSIS — K219 Gastro-esophageal reflux disease without esophagitis: Secondary | ICD-10-CM

## 2024-11-19 DIAGNOSIS — R053 Chronic cough: Secondary | ICD-10-CM

## 2024-11-19 DIAGNOSIS — R918 Other nonspecific abnormal finding of lung field: Secondary | ICD-10-CM

## 2024-11-19 DIAGNOSIS — Z23 Encounter for immunization: Secondary | ICD-10-CM

## 2024-11-26 ENCOUNTER — Emergency Department (HOSPITAL_COMMUNITY)

## 2024-11-26 ENCOUNTER — Other Ambulatory Visit: Payer: Self-pay

## 2024-11-26 ENCOUNTER — Encounter (HOSPITAL_COMMUNITY): Payer: Self-pay

## 2024-11-26 ENCOUNTER — Emergency Department: Admission: EM | Admit: 2024-11-26 | Discharge: 2024-11-26 | Disposition: A | Discharge status: Home / Self Care

## 2024-11-26 DIAGNOSIS — M7989 Other specified soft tissue disorders: Secondary | ICD-10-CM

## 2024-11-26 DIAGNOSIS — I2510 Atherosclerotic heart disease of native coronary artery without angina pectoris: Secondary | ICD-10-CM | POA: Insufficient documentation

## 2024-11-26 DIAGNOSIS — M47812 Spondylosis without myelopathy or radiculopathy, cervical region: Secondary | ICD-10-CM | POA: Insufficient documentation

## 2024-11-26 DIAGNOSIS — M549 Dorsalgia, unspecified: Secondary | ICD-10-CM | POA: Insufficient documentation

## 2024-11-26 DIAGNOSIS — M542 Cervicalgia: Secondary | ICD-10-CM

## 2024-11-26 DIAGNOSIS — W19XXXA Unspecified fall, initial encounter: Secondary | ICD-10-CM

## 2024-11-26 DIAGNOSIS — M79642 Pain in left hand: Secondary | ICD-10-CM | POA: Insufficient documentation

## 2024-11-26 DIAGNOSIS — M25512 Pain in left shoulder: Secondary | ICD-10-CM | POA: Insufficient documentation

## 2024-11-26 DIAGNOSIS — E785 Hyperlipidemia, unspecified: Secondary | ICD-10-CM | POA: Insufficient documentation

## 2024-11-26 DIAGNOSIS — M25559 Pain in unspecified hip: Secondary | ICD-10-CM

## 2024-11-26 DIAGNOSIS — W000XXA Fall on same level due to ice and snow, initial encounter: Secondary | ICD-10-CM | POA: Insufficient documentation

## 2024-11-26 DIAGNOSIS — M25552 Pain in left hip: Secondary | ICD-10-CM | POA: Insufficient documentation

## 2024-11-26 DIAGNOSIS — I252 Old myocardial infarction: Secondary | ICD-10-CM | POA: Insufficient documentation

## 2024-11-26 DIAGNOSIS — M19079 Primary osteoarthritis, unspecified ankle and foot: Secondary | ICD-10-CM | POA: Insufficient documentation

## 2024-11-26 DIAGNOSIS — S93409A Sprain of unspecified ligament of unspecified ankle, initial encounter: Secondary | ICD-10-CM

## 2024-11-26 DIAGNOSIS — R519 Headache, unspecified: Secondary | ICD-10-CM | POA: Insufficient documentation

## 2024-11-26 DIAGNOSIS — R202 Paresthesia of skin: Secondary | ICD-10-CM | POA: Insufficient documentation

## 2024-11-26 DIAGNOSIS — K439 Ventral hernia without obstruction or gangrene: Secondary | ICD-10-CM | POA: Insufficient documentation

## 2024-11-26 DIAGNOSIS — K449 Diaphragmatic hernia without obstruction or gangrene: Secondary | ICD-10-CM | POA: Insufficient documentation

## 2024-11-26 DIAGNOSIS — Z7982 Long term (current) use of aspirin: Secondary | ICD-10-CM | POA: Insufficient documentation

## 2024-11-26 DIAGNOSIS — M1712 Unilateral primary osteoarthritis, left knee: Secondary | ICD-10-CM | POA: Insufficient documentation

## 2024-11-26 DIAGNOSIS — K5730 Diverticulosis of large intestine without perforation or abscess without bleeding: Secondary | ICD-10-CM | POA: Insufficient documentation

## 2024-11-26 DIAGNOSIS — M25562 Pain in left knee: Secondary | ICD-10-CM

## 2024-11-26 DIAGNOSIS — S93402A Sprain of unspecified ligament of left ankle, initial encounter: Secondary | ICD-10-CM | POA: Insufficient documentation

## 2024-11-26 LAB — CBC WITH DIFF
BASOPHIL #: <0.1 10*3/uL (ref <=0.20)
BASOPHIL %: 0.7 %
EOSINOPHIL #: <0.1 10*3/uL (ref <=0.50)
EOSINOPHIL %: 0.2 %
HCT: 37.1 % (ref 34.8–46.0)
HGB: 12.6 g/dL (ref 11.5–16.0)
IMMATURE GRANULOCYTE #: <0.1 10*3/uL (ref <0.10)
IMMATURE GRANULOCYTE %: 0.4 % (ref 0.0–1.0)
LYMPHOCYTE #: 1.65 10*3/uL (ref 1.00–4.80)
LYMPHOCYTE %: 30 %
MCH: 29.4 pg (ref 26.0–32.0)
MCHC: 34 g/dL (ref 31.0–35.5)
MCV: 86.5 fL (ref 78.0–100.0)
MONOCYTE #: 0.63 10*3/uL (ref 0.20–1.10)
MONOCYTE %: 11.5 %
MPV: 9.1 fL (ref 8.7–12.5)
NEUTROPHIL #: 3.15 10*3/uL (ref 1.50–7.70)
NEUTROPHIL %: 57.2 %
PLATELETS: 296 10*3/uL (ref 150–400)
RBC: 4.29 10*6/uL (ref 3.85–5.22)
RDW-CV: 15 % (ref 11.5–15.5)
WBC: 5.5 10*3/uL (ref 3.7–11.0)

## 2024-11-26 LAB — BASIC METABOLIC PANEL
ANION GAP: 4 mmol/L (ref 4–13)
BUN/CREA RATIO: 16 (ref 6–22)
BUN: 17 mg/dL (ref 8–25)
CALCIUM: 8.9 mg/dL (ref 8.6–10.3)
CHLORIDE: 110 mmol/L (ref 96–111)
CO2 TOTAL: 25 mmol/L (ref 23–31)
CREATININE: 1.04 mg/dL (ref 0.60–1.05)
GLUCOSE: 77 mg/dL (ref 65–125)
POTASSIUM: 3.6 mmol/L (ref 3.5–5.1)
SODIUM: 139 mmol/L (ref 136–145)
eGFRcr - FEMALE: 59 mL/min/{1.73_m2} — ABNORMAL LOW (ref >=60)

## 2024-11-26 MED ORDER — HYDROMORPHONE (PF) 0.5 MG/0.5 ML INJECTION SYRINGE
0.5000 mg | INJECTION | INTRAMUSCULAR | Status: AC
  Administered 2024-11-26: 0.5 mg via INTRAVENOUS
  Filled 2024-11-26: qty 0.5

## 2024-11-26 MED ORDER — OXYCODONE 5 MG TABLET
5.0000 mg | ORAL_TABLET | Freq: Four times a day (QID) | ORAL | 0 refills | Status: AC | PRN
  Filled 2024-11-26: qty 12, 3d supply, fill #0

## 2024-11-26 MED ORDER — IOPAMIDOL 370 MG IODINE/ML (76 %) INTRAVENOUS SOLUTION
100.0000 mL | INTRAVENOUS | Status: AC
  Administered 2024-11-26: 100 mL via INTRAVENOUS

## 2024-11-26 MED ORDER — IBUPROFEN 600 MG TABLET
600.0000 mg | ORAL_TABLET | Freq: Four times a day (QID) | ORAL | 0 refills | Status: AC | PRN
  Filled 2024-11-26: qty 30, 8d supply, fill #0

## 2024-11-26 MED ORDER — METHOCARBAMOL 500 MG TABLET
1500.0000 mg | ORAL_TABLET | Freq: Four times a day (QID) | ORAL | 0 refills | Status: DC
  Filled 2024-11-26: qty 120, 10d supply, fill #0

## 2024-11-26 MED ORDER — NALOXONE 4 MG/ACTUATION NASAL SPRAY
1.0000 | NASAL | 0 refills | Status: AC | PRN
  Filled 2024-11-26: qty 2, 1d supply, fill #0

## 2024-11-26 MED ORDER — ACETAMINOPHEN 500 MG TABLET: 1000.0000 mg | ORAL_TABLET | Freq: Four times a day (QID) | ORAL | Status: AC | PRN

## 2024-11-26 MED ORDER — ACETAMINOPHEN 1,000 MG/100 ML (10 MG/ML) INTRAVENOUS SOLUTION
1000.0000 mg | INTRAVENOUS | Status: AC
  Administered 2024-11-26: 0 mg via INTRAVENOUS
  Administered 2024-11-26: 1000 mg via INTRAVENOUS
  Filled 2024-11-26: qty 100

## 2024-11-26 NOTE — ED Attending Handoff Note (Signed)
 69 year old female who fell and slipped on ice.  At time of sign-out, imaging is pending to rule out trauma.  No acute traumatic abnormalities were noted on CT or x-rays.  Patient was placed in an air splint for suspected ankle sprain, was able to ambulate with crutches.  Waiting on transport home.

## 2024-11-26 NOTE — ED Nurses Note (Signed)
 Patient passed PO challenge. Air Cast ordered. Awaiting aircast for ambulation challenge.   Dava Ores, CHARITY FUNDRAISER

## 2024-11-26 NOTE — ED Nurses Note (Signed)
 Report given to oncoming RN. Care transferred at this time.   Joni Reining, Charity fundraiser

## 2024-11-26 NOTE — ED Nurses Note (Signed)
 Patient log-rolled to remove clothing and reeves. C-collar remains in place and c-spine maintained. Patient tolerated well. Patient placed in gown.   Dava Ores, CHARITY FUNDRAISER

## 2024-11-26 NOTE — ED Provider Notes (Shared)
 [facility] - Emergency Department  ED Primary Note  History of Present Illness   [name] is a 69 y.o. female who had concerns including Fall. The patient reports that she slipped and fell on ice around 1330. She is amnestic to the events of her fall and is unsure what she struck on the ground. In the ED, she endorses left shoulder pain, LLE pain, left hip pain, left hand paresthesia, and a headache. PMH notable for squamous cell carcinoma, CAD, MI, HLD, heart murmur, and GERD. PSH notable for vitrectomy, thyroidectomy, hysterectomy, parotidectomy, oophorectomy, cholecystectomy, appendectomy, c-spine surgery, and heart catheterization. Review chart for full list of allergies including Cymbalta  and Lisinopril. The patient takes Aspirin . Denies any neck pain, chest pain, nausea, or other complaints.       Physical Exam   ED Triage Vitals [11/26/24 1401]   BP (Non-Invasive) (!) 168/68   Heart Rate 72   Respiratory Rate (!) 22   Temperature 36.7 C (98 F)   SpO2 96 %   Weight    Height        Physical Exam  HENT:      Head: Normocephalic and atraumatic.      Mouth/Throat:      Mouth: Mucous membranes are moist.   Eyes:      Extraocular Movements: Extraocular movements intact.      Pupils: Pupils are equal, round, and reactive to light.   Cardiovascular:      Rate and Rhythm: Normal rate and regular rhythm.   Pulmonary:      Effort: Pulmonary effort is normal.      Breath sounds: Normal breath sounds.   Abdominal:      General: There is no distension.      Palpations: Abdomen is soft.      Tenderness: There is no abdominal tenderness. There is no guarding.   Musculoskeletal:      Comments: C-collar in place, no obvious wounds to the head, tender to palpation over her left shoulder, left arm, left hip, left femur, left tib-fib area, pulses intact   Skin:     General: Skin is warm.   Neurological:      General: No focal deficit present.      Mental Status: She is alert and oriented to  person, place, and time.   Psychiatric:         Mood and Affect: Mood normal.         Behavior: Behavior normal.       Patient Data   Labs Ordered/Reviewed - No data to display  No orders to display     Medical Decision Making   {?? Select the MDM button at the top of the note composer window and be sure to fill out the MDM SmartBlock in Notewriter to the left:38154}ED clinical impression missing, please click on the following link to add Clinical Impressions ***  then refresh the note prior to signing.    Medical Decision Making  Amount and/or Complexity of Data Reviewed  Labs: ordered.  Radiology: ordered.    Risk  Prescription drug management.  Parenteral controlled substances.    69 year old female presents with a chief complaint of fall, she reports falling about 2 hours prior to arrival while she was walking in the ice.  She is amnestic to the fall, currently complaining of headache, neck pain, left arm, shoulder, hip, leg, ankle pain.  On examination she has no obvious wounds to the head, isn't a  C-collar.  She is tender to palpation on her left shoulder, left arm, left hip, left leg, left ankle.  CT pan scan ordered, x-rays ordered of tender areas.  Fentanyl  given for pain.                ED clinical impression missing, please click on the following link to add Clinical Impressions ***  then refresh the note prior to signing.    Disposition: Data Unavailable  {?Quick Link  Level of Service Calculator:123}  {Critical Care Time (Optional):37527}    I am scribing for, and in the presence of, Dr. Alm Sprang, for services provided on 11/26/2024  Fonda Otter, SCRIBE    // Fonda Otter, SCRIBE 11/26/2024, 14:19  I personally performed the services described in this documentation, as scribed  in my presence, and it is both accurate  and complete.    Alm Sprang, MD         {?? Remember to refresh your note prior to signing. Use Control + F11 or click the refresh button at the bottom of the note:38154}

## 2024-11-26 NOTE — ED Nurses Note (Signed)
 Pt left unit ambulatory with discharge instructions. Pt verbalized understanding of instructions given. PIV removed with catheter intact. Pressure dressing applied. No s/s distress noted at time of discharge.

## 2024-11-26 NOTE — ED Nurses Note (Deleted)
 Patient log-rolled to removed reeves. C-Collar remains in place.   Dava Ores, CHARITY FUNDRAISER

## 2024-11-27 ENCOUNTER — Other Ambulatory Visit: Payer: Self-pay

## 2024-11-27 ENCOUNTER — Ambulatory Visit: Admitting: Family Medicine

## 2024-11-27 ENCOUNTER — Ambulatory Visit (HOSPITAL_BASED_OUTPATIENT_CLINIC_OR_DEPARTMENT_OTHER): Admitting: Internal Medicine

## 2024-11-27 ENCOUNTER — Encounter (HOSPITAL_BASED_OUTPATIENT_CLINIC_OR_DEPARTMENT_OTHER): Payer: Self-pay | Admitting: Family Medicine

## 2024-11-27 DIAGNOSIS — M179 Osteoarthritis of knee, unspecified: Secondary | ICD-10-CM

## 2024-11-27 DIAGNOSIS — R7303 Prediabetes: Secondary | ICD-10-CM

## 2024-11-27 DIAGNOSIS — M25569 Pain in unspecified knee: Secondary | ICD-10-CM

## 2024-11-27 DIAGNOSIS — N183 Chronic kidney disease, stage 3 unspecified: Secondary | ICD-10-CM

## 2024-11-27 NOTE — Progress Notes (Signed)
 [facility]  Operated by [facility]  Progress Note  Virtual Visit, ER follow up.     Subjective:  [name] is a 69 y.o. female here for video evaluation of injuries sustained in a fall.    Pain has been present for 1 day. There has been antecedent trauma. Date of Injury (DOI) = 11/26/24. Mechanism of injury was: slipped on ice. The pain is localized to the lateral left ankle aspect of the joint.   Notes from the ER visit 11/26/24 reviewed with pt. No obvious fractures. OA knees. Rx was oxycodone . Pt has not picked this up yet. PMHx + prediabetes last A1c reviewed was 5.8, this is new for pt.   Creatinine also high at 1.04, but GFR reduced at 59. Creatinine in 09/19/24 was 1.25, 47.   Low vitamin D  as well at 20.   Xrays: ankles: I have independently reviewed the patient's xrays. I have discussed these findings with the patient. The xrays reveal no acute bony abnormalities. There is mild lateral soft tissue swelling. There are degenerative changes.   Knee xrays: I have independently reviewed the patient's xrays. I have discussed these findings with the patient. The xrays reveal no acute bony abnormalities. There are degenerative changes mild medial joint space narrowing, patellar tilting and lateralization.  Note pt has sensitivity to NSAIDs with nausea and emesis, (in addition to CKD).   Other 10-point ROS are negative.   PFMSHx: I have reviewed and updated as appropriate the past medical, family and social history.         Objective:  General WNWD in NAD. Virtual video visit    No exam    Xrays:   Left ankle,   Knees reviewed      Assessment:  1. Left ankle sprain DOI 11/25/24  2. OA knees  3. CKD  4. Prediabetes  5. Vitamin D  deficiency        Plan:  1. Ice 15 minutes QID and prn.  2. I recommend PT, and pt will let us  know where she wants to go and we will fax to them. .  3. NSAIDs: hold due to CKD. Supplement with vitamin D  5000IU q day. Recheck D in 6 weeks.   4. Xrays have been  reviewed as above.  5. Discussed that better diabetes control will improve MSK pain and dysfunction. Discussed the natural history of osteoarthritis including comorbidities of tendinopathy, fibrocartilage changes/tears, local bursae irritation, and other pain generators.    6. Follow up in [facility] in 2-3 weeks, sooner if worsening signs or symptoms. I have no personal or work limitations for the patient.     Guy Seren Chaloux, MD  Associate Professor  Director, Division of Sports Medicine  Dept of Family Medicine  [place]  [facility]

## 2024-12-05 ENCOUNTER — Ambulatory Visit (HOSPITAL_BASED_OUTPATIENT_CLINIC_OR_DEPARTMENT_OTHER): Payer: Self-pay

## 2024-12-11 ENCOUNTER — Ambulatory Visit

## 2024-12-14 ENCOUNTER — Ambulatory Visit: Payer: Self-pay | Admitting: PEDIATRIC MEDICINE

## 2024-12-18 ENCOUNTER — Ambulatory Visit (HOSPITAL_BASED_OUTPATIENT_CLINIC_OR_DEPARTMENT_OTHER): Payer: Self-pay | Admitting: Internal Medicine

## 2025-02-01 ENCOUNTER — Ambulatory Visit

## 2025-02-07 ENCOUNTER — Encounter (HOSPITAL_COMMUNITY): Payer: Self-pay | Admitting: Student in an Organized Health Care Education/Training Program

## 2025-04-01 ENCOUNTER — Ambulatory Visit (INDEPENDENT_AMBULATORY_CARE_PROVIDER_SITE_OTHER): Payer: Self-pay | Admitting: Internal Medicine

## 2025-04-29 ENCOUNTER — Ambulatory Visit (HOSPITAL_BASED_OUTPATIENT_CLINIC_OR_DEPARTMENT_OTHER): Payer: Self-pay | Admitting: Physician Assistant

## 2025-08-14 ENCOUNTER — Ambulatory Visit (INDEPENDENT_AMBULATORY_CARE_PROVIDER_SITE_OTHER): Payer: Self-pay | Admitting: Ophthalmology
